# Patient Record
Sex: Male | Born: 1960 | Race: White | Hispanic: No | Marital: Single | Smoking: Never smoker
Health system: Southern US, Community
[De-identification: ages and names within clinical notes are randomized; demographics above are authoritative.]

## PROBLEM LIST (undated history)

## (undated) DIAGNOSIS — R7303 Prediabetes: Secondary | ICD-10-CM

## (undated) DIAGNOSIS — I10 Essential (primary) hypertension: Secondary | ICD-10-CM

## (undated) DIAGNOSIS — E119 Type 2 diabetes mellitus without complications: Secondary | ICD-10-CM

## (undated) DIAGNOSIS — G8929 Other chronic pain: Secondary | ICD-10-CM

## (undated) DIAGNOSIS — I2510 Atherosclerotic heart disease of native coronary artery without angina pectoris: Secondary | ICD-10-CM

## (undated) DIAGNOSIS — E559 Vitamin D deficiency, unspecified: Secondary | ICD-10-CM

## (undated) DIAGNOSIS — I82409 Acute embolism and thrombosis of unspecified deep veins of unspecified lower extremity: Secondary | ICD-10-CM

## (undated) DIAGNOSIS — F410 Panic disorder [episodic paroxysmal anxiety] without agoraphobia: Secondary | ICD-10-CM

## (undated) DIAGNOSIS — G4730 Sleep apnea, unspecified: Secondary | ICD-10-CM

## (undated) DIAGNOSIS — I219 Acute myocardial infarction, unspecified: Secondary | ICD-10-CM

## (undated) DIAGNOSIS — M25512 Pain in left shoulder: Secondary | ICD-10-CM

## (undated) DIAGNOSIS — M549 Dorsalgia, unspecified: Secondary | ICD-10-CM

## (undated) HISTORY — DX: Essential (primary) hypertension: I10

## (undated) HISTORY — DX: Morbid (severe) obesity due to excess calories: E66.01

## (undated) HISTORY — PX: TONSILLECTOMY: SUR1361

## (undated) HISTORY — DX: Other chronic pain: G89.29

## (undated) HISTORY — PX: OTHER SURGICAL HISTORY: SHX169

## (undated) HISTORY — DX: Pain in left shoulder: M25.512

## (undated) HISTORY — DX: Vitamin D deficiency, unspecified: E55.9

## (undated) HISTORY — DX: Type 2 diabetes mellitus without complications: E11.9

## (undated) HISTORY — DX: Prediabetes: R73.03

## (undated) HISTORY — DX: Dorsalgia, unspecified: M54.9

## (undated) HISTORY — DX: Acute myocardial infarction, unspecified: I21.9

## (undated) HISTORY — PX: LUMBAR SPINE SURGERY: SHX701

## (undated) HISTORY — DX: Sleep apnea, unspecified: G47.30

## (undated) HISTORY — DX: Panic disorder (episodic paroxysmal anxiety): F41.0

## (undated) HISTORY — DX: Atherosclerotic heart disease of native coronary artery without angina pectoris: I25.10

---

## 1998-03-11 ENCOUNTER — Ambulatory Visit (HOSPITAL_COMMUNITY): Admission: RE | Admit: 1998-03-11 | Discharge: 1998-03-11 | Payer: Self-pay | Admitting: Neurosurgery

## 1998-03-11 ENCOUNTER — Encounter: Payer: Self-pay | Admitting: Neurosurgery

## 1998-03-26 ENCOUNTER — Encounter: Payer: Self-pay | Admitting: Neurosurgery

## 1998-03-26 ENCOUNTER — Ambulatory Visit (HOSPITAL_COMMUNITY): Admission: RE | Admit: 1998-03-26 | Discharge: 1998-03-26 | Payer: Self-pay | Admitting: Neurosurgery

## 1998-04-20 ENCOUNTER — Encounter: Payer: Self-pay | Admitting: Neurosurgery

## 1998-04-20 ENCOUNTER — Ambulatory Visit (HOSPITAL_COMMUNITY): Admission: RE | Admit: 1998-04-20 | Discharge: 1998-04-20 | Payer: Self-pay | Admitting: Neurosurgery

## 1998-08-12 ENCOUNTER — Encounter: Payer: Self-pay | Admitting: Neurosurgery

## 1998-08-14 ENCOUNTER — Inpatient Hospital Stay (HOSPITAL_COMMUNITY): Admission: RE | Admit: 1998-08-14 | Discharge: 1998-08-20 | Payer: Self-pay | Admitting: Neurosurgery

## 1998-08-14 ENCOUNTER — Encounter: Payer: Self-pay | Admitting: Neurosurgery

## 1998-08-15 ENCOUNTER — Encounter: Payer: Self-pay | Admitting: Neurosurgery

## 1998-08-27 ENCOUNTER — Inpatient Hospital Stay (HOSPITAL_COMMUNITY): Admission: AD | Admit: 1998-08-27 | Discharge: 1998-09-04 | Payer: Self-pay | Admitting: Neurological Surgery

## 1998-08-28 ENCOUNTER — Encounter: Payer: Self-pay | Admitting: Neurological Surgery

## 1999-02-02 ENCOUNTER — Encounter: Payer: Self-pay | Admitting: Neurosurgery

## 1999-02-02 ENCOUNTER — Encounter: Admission: RE | Admit: 1999-02-02 | Discharge: 1999-02-02 | Payer: Self-pay | Admitting: Neurosurgery

## 1999-10-28 ENCOUNTER — Encounter: Admission: RE | Admit: 1999-10-28 | Discharge: 1999-10-28 | Payer: Self-pay | Admitting: Neurosurgery

## 1999-10-28 ENCOUNTER — Encounter: Payer: Self-pay | Admitting: Neurosurgery

## 2001-02-19 ENCOUNTER — Emergency Department (HOSPITAL_COMMUNITY): Admission: EM | Admit: 2001-02-19 | Discharge: 2001-02-19 | Payer: Self-pay | Admitting: *Deleted

## 2001-02-19 ENCOUNTER — Encounter: Payer: Self-pay | Admitting: *Deleted

## 2010-03-09 ENCOUNTER — Emergency Department (HOSPITAL_COMMUNITY)
Admission: EM | Admit: 2010-03-09 | Discharge: 2010-03-09 | Payer: Self-pay | Source: Home / Self Care | Admitting: Emergency Medicine

## 2010-06-08 LAB — DIFFERENTIAL
Basophils Absolute: 0 10*3/uL (ref 0.0–0.1)
Lymphocytes Relative: 9 % — ABNORMAL LOW (ref 12–46)
Lymphs Abs: 0.9 10*3/uL (ref 0.7–4.0)
Neutrophils Relative %: 83 % — ABNORMAL HIGH (ref 43–77)

## 2010-06-08 LAB — CBC
HCT: 38.6 % — ABNORMAL LOW (ref 39.0–52.0)
Hemoglobin: 13.3 g/dL (ref 13.0–17.0)
MCHC: 34.5 g/dL (ref 30.0–36.0)
MCV: 93.7 fL (ref 78.0–100.0)
RBC: 4.12 MIL/uL — ABNORMAL LOW (ref 4.22–5.81)

## 2010-06-08 LAB — POCT CARDIAC MARKERS
CKMB, poc: <1 ng/mL — ABNORMAL LOW (ref 1.0–8.0)
Troponin i, poc: <0.05 ng/mL (ref 0.00–0.09)

## 2010-06-08 LAB — BASIC METABOLIC PANEL
Calcium: 9 mg/dL (ref 8.4–10.5)
Creatinine, Ser: 1.17 mg/dL (ref 0.4–1.5)
GFR calc Af Amer: >60 mL/min (ref >60)

## 2010-06-08 LAB — BRAIN NATRIURETIC PEPTIDE: Pro B Natriuretic peptide (BNP): 51.9 pg/mL (ref 0.0–100.0)

## 2012-09-09 DIAGNOSIS — R079 Chest pain, unspecified: Secondary | ICD-10-CM

## 2012-09-17 ENCOUNTER — Encounter: Payer: Self-pay | Admitting: Cardiology

## 2013-01-10 ENCOUNTER — Ambulatory Visit (HOSPITAL_COMMUNITY)
Admission: RE | Admit: 2013-01-10 | Discharge: 2013-01-10 | Disposition: A | Discharge status: Home / Self Care | Payer: Medicaid Other | Source: Ambulatory Visit | Attending: Family Medicine | Admitting: Family Medicine

## 2013-01-10 ENCOUNTER — Other Ambulatory Visit (HOSPITAL_COMMUNITY): Payer: Self-pay | Admitting: Family Medicine

## 2013-01-10 DIAGNOSIS — M25561 Pain in right knee: Secondary | ICD-10-CM

## 2013-01-10 DIAGNOSIS — IMO0002 Reserved for concepts with insufficient information to code with codable children: Secondary | ICD-10-CM | POA: Insufficient documentation

## 2013-01-10 DIAGNOSIS — M25569 Pain in unspecified knee: Secondary | ICD-10-CM | POA: Diagnosis present

## 2013-01-10 DIAGNOSIS — M1710 Unilateral primary osteoarthritis, unspecified knee: Secondary | ICD-10-CM | POA: Insufficient documentation

## 2013-11-12 ENCOUNTER — Telehealth: Payer: Self-pay | Admitting: Family Medicine

## 2013-11-12 NOTE — Telephone Encounter (Signed)
Pt will call and have PCP changed Will call back for appt

## 2013-11-18 ENCOUNTER — Telehealth: Payer: Self-pay | Admitting: Family Medicine

## 2013-11-18 NOTE — Telephone Encounter (Signed)
appt scheduled for Wed with Dr. Sabra Heck patient aware that [facility] is not taking new patients at this time

## 2013-11-20 ENCOUNTER — Encounter: Payer: Self-pay | Admitting: Family Medicine

## 2013-11-20 ENCOUNTER — Ambulatory Visit (INDEPENDENT_AMBULATORY_CARE_PROVIDER_SITE_OTHER): Payer: Medicaid Other | Admitting: Family Medicine

## 2013-11-20 ENCOUNTER — Encounter (INDEPENDENT_AMBULATORY_CARE_PROVIDER_SITE_OTHER): Payer: Self-pay

## 2013-11-20 VITALS — BP 156/98 | HR 87 | Temp 99.0°F | Ht 69.0 in | Wt 398.0 lb

## 2013-11-20 DIAGNOSIS — M545 Low back pain, unspecified: Secondary | ICD-10-CM

## 2013-11-20 DIAGNOSIS — R635 Abnormal weight gain: Secondary | ICD-10-CM

## 2013-11-20 DIAGNOSIS — I10 Essential (primary) hypertension: Secondary | ICD-10-CM

## 2013-11-20 DIAGNOSIS — Z1211 Encounter for screening for malignant neoplasm of colon: Secondary | ICD-10-CM

## 2013-11-20 DIAGNOSIS — Z0000 Encounter for general adult medical examination without abnormal findings: Secondary | ICD-10-CM

## 2013-11-20 LAB — POCT CBC
Granulocyte percent: 84.9 %G — AB (ref 37–80)
HCT, POC: 43.8 % (ref 43.5–53.7)
HEMOGLOBIN: 14.3 g/dL (ref 14.1–18.1)
Lymph, poc: 1 (ref 0.6–3.4)
MCH: 31.1 pg (ref 27–31.2)
MCHC: 32.6 g/dL (ref 31.8–35.4)
MCV: 95.5 fL (ref 80–97)
MPV: 8.8 fL (ref 0–99.8)
POC GRANULOCYTE: 7.7 — AB (ref 2–6.9)
POC LYMPH PERCENT: 11.4 %L (ref 10–50)
Platelet Count, POC: 248 10*3/uL (ref 142–424)
RBC: 4.6 M/uL — AB (ref 4.69–6.13)
RDW, POC: 13.2 %
WBC: 9.1 10*3/uL (ref 4.6–10.2)

## 2013-11-20 MED ORDER — HYDROCODONE-ACETAMINOPHEN 10-325 MG PO TABS: 1.0000 | ORAL_TABLET | Freq: Three times a day (TID) | ORAL | Status: DC | PRN

## 2013-11-20 MED ORDER — BISOPROLOL-HYDROCHLOROTHIAZIDE 10-6.25 MG PO TABS: 1.0000 | ORAL_TABLET | Freq: Every day | ORAL | Status: DC

## 2013-11-20 MED ORDER — TERBINAFINE HCL 250 MG PO TABS: 250.0000 mg | ORAL_TABLET | Freq: Every day | ORAL | Status: DC

## 2013-11-20 NOTE — Progress Notes (Signed)
   Subjective:    Patient ID: Bradley Torres, male    DOB: Jul 29, 1960, 53 y.o.   MRN: [id number]  HPI  53 year old gentleman from [place] who has been out of medical care for several months. He recently acquired Medicaid but still has not been able to find a doctor that will take him. 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 He was previously treated for high blood pressure with metoprolol and hydrochlorothiazide but has been off of all meds for 6 months. He was treated with a beta blocker apparently for some palpitations. He has been hospitalized previously with chest pain and underwent stress testing which showed no    Review of Systems  Constitutional: Positive for fatigue.  HENT: Negative.   Cardiovascular: Negative.   Gastrointestinal: Negative.   Genitourinary: Negative.   Neurological: Negative.   Psychiatric/Behavioral: Negative.        Objective:   Physical Exam  Constitutional: He is oriented to person, place, and time.  Morbid obesity  HENT:  Head: Normocephalic.  Eyes: Pupils are equal, round, and reactive to light.  Neck: Normal range of motion.  Cardiovascular: Normal rate, regular rhythm and normal heart sounds.   Pulmonary/Chest: Effort normal and breath sounds normal.  Abdominal: Soft.  Genitourinary:  Erythema of scrotum and inner thigh Does not appear to be cellulitis  Musculoskeletal: He exhibits edema.  Neurological: He is alert and oriented to person, place, and time.  Psychiatric: He has a normal mood and affect. His behavior is normal. Judgment and thought content normal.   BP 156/98  Pulse 87  Temp(Src) 99 F (37.2 C) (Oral)  Ht $R'5\' 9"'Bk$  (1.753 m)  Wt 398 lb (180.532 kg)  BMI 58.75 kg/m2       Assessment & Plan:  1. Colon cancer screening  - Ambulatory referral to Gastroenterology  2. Routine  general medical examination at a health care facility Pt has been past around and not received medical care due to analgesic needs.  I explained I will treat and trust him as long as he is honest and uses meds only as prescribed. - POCT CBC - CMP14+EGFR - Lipid panel  3. Morbid obesity Discussed briefly.  Not likely that he will seriously try to lose weight  4. 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 Essential hypertension, benign Begin Ziac for beta blockade effect and diuretic.Marland Kitchen  Wardell Honour MD

## 2013-11-21 LAB — CMP14+EGFR
A/G RATIO: 1.4 (ref 1.1–2.5)
ALBUMIN: 4.2 g/dL (ref 3.5–5.5)
ALK PHOS: 76 IU/L (ref 39–117)
ALT: 27 IU/L (ref 0–44)
AST: 24 IU/L (ref 0–40)
BUN/Creatinine Ratio: 13 (ref 9–20)
BUN: 15 mg/dL (ref 6–24)
CALCIUM: 9.3 mg/dL (ref 8.7–10.2)
CO2: 25 mmol/L (ref 18–29)
CREATININE: 1.2 mg/dL (ref 0.76–1.27)
Chloride: 100 mmol/L (ref 97–108)
GFR calc Af Amer: 80 mL/min/{1.73_m2} (ref >59)
GFR, EST NON AFRICAN AMERICAN: 70 mL/min/{1.73_m2} (ref >59)
Globulin, Total: 3 g/dL (ref 1.5–4.5)
Glucose: 115 mg/dL — ABNORMAL HIGH (ref 65–99)
POTASSIUM: 4.8 mmol/L (ref 3.5–5.2)
Sodium: 141 mmol/L (ref 134–144)
TOTAL PROTEIN: 7.2 g/dL (ref 6.0–8.5)
Total Bilirubin: 0.4 mg/dL (ref 0.0–1.2)

## 2013-11-21 LAB — LIPID PANEL
CHOLESTEROL TOTAL: 177 mg/dL (ref 100–199)
Chol/HDL Ratio: 3.3 ratio units (ref 0.0–5.0)
HDL: 53 mg/dL (ref >39)
LDL Calculated: 111 mg/dL — ABNORMAL HIGH (ref 0–99)
Triglycerides: 63 mg/dL (ref 0–149)
VLDL CHOLESTEROL CAL: 13 mg/dL (ref 5–40)

## 2013-11-25 ENCOUNTER — Telehealth: Payer: Self-pay | Admitting: Family Medicine

## 2013-11-25 NOTE — Telephone Encounter (Signed)
Message copied by Waverly Ferrari on Mon Nov 25, 2013  8:48 AM ------      Message from: Wardell Honour      Created: Thu Nov 21, 2013  8:03 PM       Pt has pre-diabetes; needs to lose weight , watch carb intake, and exercise.  Lipids are okay ------

## 2013-12-19 ENCOUNTER — Telehealth: Payer: Self-pay | Admitting: Family Medicine

## 2013-12-19 NOTE — Telephone Encounter (Signed)
error 

## 2013-12-23 ENCOUNTER — Encounter: Payer: Self-pay | Admitting: Family Medicine

## 2014-01-23 ENCOUNTER — Encounter: Payer: Self-pay | Admitting: Family Medicine

## 2014-01-23 ENCOUNTER — Ambulatory Visit (INDEPENDENT_AMBULATORY_CARE_PROVIDER_SITE_OTHER): Payer: Medicaid Other | Admitting: Family Medicine

## 2014-01-23 VITALS — BP 158/94 | HR 67 | Temp 98.5°F | Ht 69.0 in | Wt >= 6400 oz

## 2014-01-23 DIAGNOSIS — R635 Abnormal weight gain: Secondary | ICD-10-CM

## 2014-01-23 DIAGNOSIS — M549 Dorsalgia, unspecified: Secondary | ICD-10-CM | POA: Insufficient documentation

## 2014-01-23 DIAGNOSIS — I152 Hypertension secondary to endocrine disorders: Secondary | ICD-10-CM | POA: Insufficient documentation

## 2014-01-23 DIAGNOSIS — G8929 Other chronic pain: Secondary | ICD-10-CM | POA: Insufficient documentation

## 2014-01-23 DIAGNOSIS — I10 Essential (primary) hypertension: Secondary | ICD-10-CM

## 2014-01-23 DIAGNOSIS — E1159 Type 2 diabetes mellitus with other circulatory complications: Secondary | ICD-10-CM | POA: Insufficient documentation

## 2014-01-23 DIAGNOSIS — M545 Low back pain: Secondary | ICD-10-CM

## 2014-01-23 MED ORDER — HYDROCODONE-ACETAMINOPHEN 10-325 MG PO TABS: 1.0000 | ORAL_TABLET | Freq: Three times a day (TID) | ORAL | Status: DC | PRN

## 2014-01-23 MED ORDER — LOSARTAN POTASSIUM 50 MG PO TABS: 50.0000 mg | ORAL_TABLET | Freq: Every day | ORAL | Status: DC

## 2014-01-23 NOTE — Progress Notes (Signed)
   Subjective:    Patient ID: Bradley Torres, male    DOB: [birth year], 53 y.o.   MRN: [id number]  HPI 53 year old gentleman here for follow-up. He was seen 1 month ago was started on beta blocker with diuretic. Pressure has responded minimally. As a result of some screening lab work he was found to have prediabetic glucose levels and advise to watch his carbohydrate intake and try to lose some weight. He says that he is more active since I gave him an analgesic for his back pain but his weight has gone up. I encouraged him to continue with activity and dieting. We talked about possibility of gastric bypass surgery which I think would make sense,  given his size and propensity to develop diabetes, but he is not interested. Also thought about using G LP agent but he does not want to think about needles    Review of Systems  Constitutional: Negative.   HENT: Negative.   Eyes: Negative.   Respiratory: Negative.  Negative for shortness of breath.   Cardiovascular: Negative.  Negative for chest pain and leg swelling.  Gastrointestinal: Negative.   Genitourinary: Negative.   Musculoskeletal: Negative.   Skin: Negative.   Neurological: Negative.   Psychiatric/Behavioral: Negative.   All other systems reviewed and are negative.      Objective:   Physical Exam  Constitutional: He is oriented to person, place, and time. He appears well-developed and well-nourished.  HENT:  Head: Normocephalic.  Right Ear: External ear normal.  Left Ear: External ear normal.  Nose: Nose normal.  Mouth/Throat: Oropharynx is clear and moist.  Eyes: Conjunctivae and EOM are normal. Pupils are equal, round, and reactive to light.  Neck: Normal range of motion. Neck supple.  Cardiovascular: Normal rate, regular rhythm, normal heart sounds and intact distal pulses.   Pulmonary/Chest: Effort normal and breath sounds normal.  Abdominal: Soft. Bowel sounds are normal.  Musculoskeletal: Normal range of motion.    Neurological: He is alert and oriented to person, place, and time.  Skin: Skin is warm and dry.  Psychiatric: He has a normal mood and affect. His behavior is normal. Judgment and thought content normal.     BP 158/94  Pulse 67  Temp(Src) 98.5 F (36.9 C) (Oral)  Ht 5\' 9"  (1.753 m)  Wt 403 lb 12.8 oz (183.162 kg)  BMI 59.60 kg/m2     Assessment & Plan:  1. Essential hypertension Not at goal.  Add Losartan, 50 mg   2. Low back pain, unspecified back pain laterality, with sciatica presence unspecified Refill Norco #  3. Severe obesity (BMI >= 40) Encourage better dieting and movement  Wardell Honour MD

## 2014-01-23 NOTE — Patient Instructions (Signed)
Diabetes and Exercise Exercising regularly is important. It is not just about losing weight. It has many health benefits, such as:  Improving your overall fitness, flexibility, and endurance.  Increasing your bone density.  Helping with weight control.  Decreasing your body fat.  Increasing your muscle strength.  Reducing stress and tension.  Improving your overall health. People with diabetes who exercise gain additional benefits because exercise:  Reduces appetite.  Improves the body's use of blood sugar (glucose).  Helps lower or control blood glucose.  Decreases blood pressure.  Helps control blood lipids (such as cholesterol and triglycerides).  Improves the body's use of the hormone insulin by:  Increasing the body's insulin sensitivity.  Reducing the body's insulin needs.  Decreases the risk for heart disease because exercising:  Lowers cholesterol and triglycerides levels.  Increases the levels of good cholesterol (such as high-density lipoproteins [HDL]) in the body.  Lowers blood glucose levels. YOUR ACTIVITY PLAN  Choose an activity that you enjoy and set realistic goals. Your health care provider or diabetes educator can help you make an activity plan that works for you. Exercise regularly as directed by your health care provider. This includes:  Performing resistance training twice a week such as push-ups, sit-ups, lifting weights, or using resistance bands.  Performing 150 minutes of cardio exercises each week such as walking, running, or playing sports.  Staying active and spending no more than 90 minutes at one time being inactive. Even short bursts of exercise are good for you. Three 10-minute sessions spread throughout the day are just as beneficial as a single 30-minute session. Some exercise ideas include:  Taking the dog for a walk.  Taking the stairs instead of the elevator.  Dancing to your favorite song.  Doing an exercise  video.  Doing your favorite exercise with a friend. RECOMMENDATIONS FOR EXERCISING WITH TYPE 1 OR TYPE 2 DIABETES   Check your blood glucose before exercising. If blood glucose levels are greater than 240 mg/dL, check for urine ketones. Do not exercise if ketones are present.  Avoid injecting insulin into areas of the body that are going to be exercised. For example, avoid injecting insulin into:  The arms when playing tennis.  The legs when jogging.  Keep a record of:  Food intake before and after you exercise.  Expected peak times of insulin action.  Blood glucose levels before and after you exercise.  The type and amount of exercise you have done.  Review your records with your health care provider. Your health care provider will help you to develop guidelines for adjusting food intake and insulin amounts before and after exercising.  If you take insulin or oral hypoglycemic agents, watch for signs and symptoms of hypoglycemia. They include:  Dizziness.  Shaking.  Sweating.  Chills.  Confusion.  Drink plenty of water while you exercise to prevent dehydration or heat stroke. Body water is lost during exercise and must be replaced.  Talk to your health care provider before starting an exercise program to make sure it is safe for you. Remember, almost any type of activity is better than none. Document Released: 06/04/2003 Document Revised: 07/29/2013 Document Reviewed: 08/21/2012 Exit[employer] Patient Information 2015 [employer]. This information is not intended to replace advice given to you by your health care provider. Make sure you discuss any questions you have with your health care provider.  

## 2014-02-07 ENCOUNTER — Telehealth: Payer: Self-pay | Admitting: Family Medicine

## 2014-02-11 NOTE — Telephone Encounter (Signed)
Multiple attempts have been made to contact pt regarding scheduling appt, will close encounter

## 2014-02-17 ENCOUNTER — Encounter: Payer: Self-pay | Admitting: Family Medicine

## 2014-02-17 ENCOUNTER — Telehealth: Payer: Self-pay | Admitting: Family Medicine

## 2014-02-17 ENCOUNTER — Ambulatory Visit (INDEPENDENT_AMBULATORY_CARE_PROVIDER_SITE_OTHER): Payer: Medicaid Other | Admitting: Family Medicine

## 2014-02-17 VITALS — BP 162/88 | HR 80 | Temp 97.5°F | Ht 69.0 in | Wt 399.4 lb

## 2014-02-17 DIAGNOSIS — R739 Hyperglycemia, unspecified: Secondary | ICD-10-CM

## 2014-02-17 DIAGNOSIS — R0600 Dyspnea, unspecified: Secondary | ICD-10-CM

## 2014-02-17 MED ORDER — BISOPROLOL-HYDROCHLOROTHIAZIDE 10-6.25 MG PO TABS: 1.0000 | ORAL_TABLET | Freq: Every day | ORAL | Status: DC

## 2014-02-17 MED ORDER — CITALOPRAM HYDROBROMIDE 20 MG PO TABS: 20.0000 mg | ORAL_TABLET | Freq: Every day | ORAL | Status: DC

## 2014-02-17 NOTE — Progress Notes (Signed)
   Subjective:    Patient ID: Bradley Torres, male    DOB: 01-26-1961, 53 y.o.   MRN: [id number]  HPI 53 year old male here for follow-up visit. At his last visit losartan was added to Ziac, 10 mg. He did not understand that he is to take both prescriptions. Consequently his pressure today is elevated at 162/88. In addition he was found to be prediabetic on his last lab work in the losartan will confer some protective advantage to his kidneys.  Today he also complains of some subjective shortness of breath. There is no cough. There is no history of asthma or chronic lung disease. This dyspneic sensation seems to be related to anxiety.    Review of Systems  Constitutional: Negative.   HENT: Negative.   Eyes: Negative.   Respiratory: Positive for shortness of breath.   Cardiovascular: Negative.  Negative for chest pain and leg swelling.  Gastrointestinal: Negative.   Genitourinary: Negative.   Musculoskeletal: Negative.   Skin: Negative.   Neurological: Negative.   Psychiatric/Behavioral: Negative.   All other systems reviewed and are negative.      Objective:   Physical Exam  Constitutional: He is oriented to person, place, and time. He appears well-developed and well-nourished.  HENT:  Head: Normocephalic.  Right Ear: External ear normal.  Left Ear: External ear normal.  Nose: Nose normal.  Mouth/Throat: Oropharynx is clear and moist.  Eyes: Conjunctivae and EOM are normal. Pupils are equal, round, and reactive to light.  Neck: Normal range of motion. Neck supple.  Cardiovascular: Normal rate, regular rhythm, normal heart sounds and intact distal pulses.   Pulmonary/Chest: Effort normal and breath sounds normal.  Abdominal: Soft. Bowel sounds are normal.  Musculoskeletal: Normal range of motion.  Neurological: He is alert and oriented to person, place, and time.  Skin: Skin is warm and dry.  Psychiatric: He has a normal mood and affect. His behavior is normal. Judgment and  thought content normal.   BP 162/88 mmHg  Pulse 80  Temp(Src) 97.5 F (36.4 C) (Oral)  Ht 5\' 9"  (1.753 m)  Wt 399 lb 6.4 oz (181.167 kg)  BMI 58.95 kg/m2       Assessment & Plan:  1. Hyperglycemia We'll check A1c today. Given his obesity diabetes is potential risk  2. Dyspnea Suspect panic d.Pulse ox normal at 95% Will try celexa x 1 month  Wardell Honour MD

## 2014-02-17 NOTE — Telephone Encounter (Signed)
Appointment 03/27/2014 at 9:30 with Sabra Heck detailed message left.

## 2014-02-19 ENCOUNTER — Ambulatory Visit: Payer: Medicaid Other | Admitting: Family Medicine

## 2014-03-27 ENCOUNTER — Ambulatory Visit (INDEPENDENT_AMBULATORY_CARE_PROVIDER_SITE_OTHER): Payer: Medicaid Other | Admitting: Family Medicine

## 2014-03-27 ENCOUNTER — Ambulatory Visit (INDEPENDENT_AMBULATORY_CARE_PROVIDER_SITE_OTHER): Payer: Medicaid Other

## 2014-03-27 ENCOUNTER — Encounter: Payer: Self-pay | Admitting: Family Medicine

## 2014-03-27 VITALS — BP 147/88 | HR 69 | Temp 97.5°F | Ht 69.0 in | Wt 394.4 lb

## 2014-03-27 DIAGNOSIS — M25512 Pain in left shoulder: Secondary | ICD-10-CM

## 2014-03-27 MED ORDER — HYDROCODONE-ACETAMINOPHEN 10-325 MG PO TABS: 1.0000 | ORAL_TABLET | Freq: Three times a day (TID) | ORAL | Status: DC | PRN

## 2014-03-27 NOTE — Progress Notes (Signed)
   Subjective:    Patient ID: Bradley Torres, male    DOB: 1961/02/22, 53 y.o.   MRN: [id number]  HPI    Review of Systems     Objective:   Physical Exam        Assessment & Plan:  1. Pain in joint, shoulder region, left Plain film is unrevealing. I still suspect rotator cuff tear. Would like to try several weeks of physical therapy and if no progress is made send for MRI. Did refill hydrocodone that he has taken for back pain - DG Shoulder Left  Wardell Honour MD - Ambulatory referral to Physical Therapy

## 2014-03-27 NOTE — Progress Notes (Signed)
   Subjective:    Patient ID: JAMONT MELLIN, male    DOB: 02-18-1961, 53 y.o.   MRN: [id number]  Shoulder Pain    patient is here to follow-up his breathing and panic disorder. He did not take Celexa but 3 days because it made him "feel funny." He really has not had any more of the panic attacks by history but complains today more of his left shoulder. He had a fall about 3 weeks ago injuring the shoulder. Since then he really has not been able to use his left arm due to pain.    Review of Systems  Musculoskeletal: Negative.        Left shoulder pain seems to be more lateral aspect       Objective:   Physical Exam  Musculoskeletal:  Exam limited to the left shoulder: Really decreased range of motion. I could not get him to go through much of any range of motion. Internal rotation seem to be okay. Again tenderness seems to be more lateral but is also generalized   BP 147/88 mmHg  Pulse 69  Temp(Src) 97.5 F (36.4 C) (Oral)  Ht 5\' 9"  (1.753 m)  Wt 394 lb 6.4 oz (178.899 kg)  BMI 58.22 kg/m2       Assessment & Plan:

## 2014-04-08 ENCOUNTER — Ambulatory Visit: Payer: Medicaid Other | Attending: Family Medicine | Admitting: Physical Therapy

## 2014-04-08 DIAGNOSIS — M25512 Pain in left shoulder: Secondary | ICD-10-CM | POA: Insufficient documentation

## 2014-05-06 ENCOUNTER — Telehealth: Payer: Self-pay | Admitting: Family Medicine

## 2014-05-06 ENCOUNTER — Ambulatory Visit: Payer: Medicaid Other | Admitting: Family Medicine

## 2014-05-13 ENCOUNTER — Telehealth: Payer: Self-pay | Admitting: Family Medicine

## 2014-05-13 NOTE — Telephone Encounter (Signed)
Appointment scheduled for 2/25 with Sabra Heck.

## 2014-05-15 NOTE — Telephone Encounter (Signed)
Several attempts have been made to contact patient. This encounter will be closed.  

## 2014-05-22 ENCOUNTER — Encounter: Payer: Self-pay | Admitting: Family Medicine

## 2014-05-22 ENCOUNTER — Ambulatory Visit (INDEPENDENT_AMBULATORY_CARE_PROVIDER_SITE_OTHER): Payer: Medicaid Other | Admitting: Family Medicine

## 2014-05-22 VITALS — BP 158/98 | HR 80 | Temp 98.0°F | Ht 69.0 in | Wt >= 6400 oz

## 2014-05-22 DIAGNOSIS — R079 Chest pain, unspecified: Secondary | ICD-10-CM

## 2014-05-22 DIAGNOSIS — M25512 Pain in left shoulder: Secondary | ICD-10-CM

## 2014-05-22 MED ORDER — HYDROCODONE-ACETAMINOPHEN 10-325 MG PO TABS: 1.0000 | ORAL_TABLET | Freq: Four times a day (QID) | ORAL | Status: DC | PRN

## 2014-05-22 NOTE — Progress Notes (Signed)
   Subjective:    Patient ID: Bradley Torres, male    DOB: [birth year], 54 y.o.   MRN: [id number]  HPI 54 year old man with left shoulder pain that occurred after a fall. He was seen here on 1231 x-rays and send to physical therapy. By his history physical therapist told him that they could not help him and he now returns for further care. Basically does not move the arm or shoulder except from the elbow down. He has been given hydrocodone for pain.  As we were finishing up, or so I thought, he tells me about some chest pain. He apparently had a stress test at [facility] several years ago the results of which are not very conclusive and he is or my mind. Since he has this pain and were not sure if he could have ischemic disease I would like to get him seen by cardiology consultation. Pain is atypical and occurs at rest or with exertion but is somewhat relieved by change of position. I explained that this is not typical of angina but on the other hand I cannot be certain  Patient Active Problem List   Diagnosis Date Noted  . Essential hypertension 01/23/2014  . Back pain 01/23/2014  . Severe obesity (BMI >= 40) 01/23/2014   Outpatient Encounter Prescriptions as of 05/22/2014  Medication Sig  . bisoprolol-hydrochlorothiazide (ZIAC) 10-6.25 MG per tablet Take 1 tablet by mouth daily.  Marland Kitchen losartan (COZAAR) 50 MG tablet Take 1 tablet (50 mg total) by mouth daily.  Marland Kitchen terbinafine (LAMISIL) 250 MG tablet Take 1 tablet (250 mg total) by mouth daily.  Marland Kitchen HYDROcodone-acetaminophen (NORCO) 10-325 MG per tablet Take 1 tablet by mouth every 8 (eight) hours as needed. (Patient not taking: Reported on 05/22/2014)     Review of Systems  Respiratory: Positive for chest tightness.   Musculoskeletal: Positive for arthralgias.       L shoulder       Objective:   Physical Exam  Cardiovascular: Normal rate and regular rhythm.   Pulmonary/Chest: Effort normal and breath sounds normal.  Musculoskeletal:  Very  limited range of motion and hard to localize pain     BP 158/98 mmHg  Pulse 80  Temp(Src) 98 F (36.7 C) (Oral)  Ht 5\' 9"  (1.753 m)  Wt 404 lb (183.253 kg)  BMI 59.63 kg/m2      Assessment & Plan:  1. Chest pain, unspecified chest pain type  - Ambulatory referral to Cardiology  2. Left shoulder pain  - MR Shoulder Left Wo Contrast; Future  3. Hypertension Increase losartan to 100 mg  Wardell Honour MD

## 2014-06-03 ENCOUNTER — Other Ambulatory Visit: Payer: Self-pay | Admitting: *Deleted

## 2014-06-03 MED ORDER — BISOPROLOL-HYDROCHLOROTHIAZIDE 10-6.25 MG PO TABS: 1.0000 | ORAL_TABLET | Freq: Every day | ORAL | Status: DC

## 2014-06-05 ENCOUNTER — Encounter: Payer: Self-pay | Admitting: Cardiology

## 2014-06-09 ENCOUNTER — Encounter: Payer: Self-pay | Admitting: Cardiology

## 2014-06-09 ENCOUNTER — Encounter: Payer: PRIVATE HEALTH INSURANCE | Admitting: Cardiology

## 2014-06-09 ENCOUNTER — Telehealth: Payer: Self-pay | Admitting: Family Medicine

## 2014-06-09 NOTE — Progress Notes (Signed)
No show  This encounter was created in error - please disregard.

## 2014-06-16 ENCOUNTER — Other Ambulatory Visit: Payer: Self-pay

## 2014-06-16 DIAGNOSIS — M25512 Pain in left shoulder: Secondary | ICD-10-CM

## 2014-06-19 ENCOUNTER — Ambulatory Visit: Payer: Medicaid Other | Admitting: Family Medicine

## 2014-06-25 ENCOUNTER — Telehealth: Payer: Self-pay | Admitting: Cardiovascular Disease

## 2014-06-25 ENCOUNTER — Encounter: Payer: Self-pay | Admitting: *Deleted

## 2014-06-25 ENCOUNTER — Encounter: Payer: Self-pay | Admitting: Cardiovascular Disease

## 2014-06-25 ENCOUNTER — Ambulatory Visit (INDEPENDENT_AMBULATORY_CARE_PROVIDER_SITE_OTHER): Payer: Medicaid Other | Admitting: Cardiovascular Disease

## 2014-06-25 VITALS — BP 143/83 | HR 81 | Ht 71.0 in | Wt >= 6400 oz

## 2014-06-25 DIAGNOSIS — R079 Chest pain, unspecified: Secondary | ICD-10-CM

## 2014-06-25 DIAGNOSIS — R0609 Other forms of dyspnea: Secondary | ICD-10-CM

## 2014-06-25 DIAGNOSIS — I10 Essential (primary) hypertension: Secondary | ICD-10-CM

## 2014-06-25 DIAGNOSIS — Z8249 Family history of ischemic heart disease and other diseases of the circulatory system: Secondary | ICD-10-CM | POA: Diagnosis not present

## 2014-06-25 MED ORDER — BISOPROLOL-HYDROCHLOROTHIAZIDE 10-6.25 MG PO TABS: ORAL_TABLET | ORAL | Status: DC

## 2014-06-25 MED ORDER — BISOPROLOL-HYDROCHLOROTHIAZIDE 10-6.25 MG PO TABS: 1.0000 | ORAL_TABLET | Freq: Every day | ORAL | Status: DC

## 2014-06-25 NOTE — Progress Notes (Signed)
Patient ID: Bradley Torres, male   DOB: 1961/03/22, 54 y.o.   MRN: [id number]       CARDIOLOGY CONSULT NOTE  Patient ID: Bradley Torres MRN: [id number] DOB/AGE: [birth year] 54 y.o.  Admit date: (Not on file) Primary Physician Wardell Honour, MD  Reason for Consultation: chest pain  HPI: The patient is a 54 year old male with a history of morbid obesity, hypertension, and a left shoulder injury leading to left arm pain who has been referred for the evaluation of chest pain. He reportedly has a strong family history of coronary artery disease.  He was hospitalized for chest pain in June 2014 at [facility]. He ultimately underwent a 2-day nuclear stress test at that time which demonstrated equivocal left ventricular perfusion. Variable soft tissue attenuation artifact was noted. There was a partially reversible defect in the mid anterolateral and anteroseptal walls and a mild degree of ischemia could not entirely be excluded. While left ventricular systolic function appeared to be normal on visual inspection, calculated LVEF was 47%.  He describes a left precordial chest pain that can occur with and without exertion, and is occasionally associated with shortness of breath. He has radiation of this pain down his left arm but also describes a left shoulder injury which he is soon to have imaged via MRI in [place]. The chest pain, however, predates his left shoulder injury. He occasionally has lightheadedness but denies syncope. He denies orthopnea but there is a questionable history of paroxysmal nocturnal dyspnea.  ECG performed in the office today demonstrates normal sinus rhythm with an isolated PVC, and is without ischemic ST segment or T-wave abnormalities.  Fam: Father had MI at 62.  Allergies  Allergen Reactions  . Ibuprofen     Feels that it caused rectal bleeding    Current Outpatient Prescriptions  Medication Sig Dispense Refill  . bisoprolol-hydrochlorothiazide  (ZIAC) 10-6.25 MG per tablet Take 1 tablet by mouth daily. 30 tablet 4  . HYDROcodone-acetaminophen (NORCO) 10-325 MG per tablet Take 1 tablet by mouth every 6 (six) hours as needed. 90 tablet 0  . losartan (COZAAR) 50 MG tablet Take 1 tablet (50 mg total) by mouth daily. 30 tablet 3  . terbinafine (LAMISIL) 250 MG tablet Take 1 tablet (250 mg total) by mouth daily. 71 tablet 1   No current facility-administered medications for this visit.    Past Medical History  Diagnosis Date  . Essential hypertension   . Panic disorder   . Prediabetes   . Morbid obesity   . Shoulder pain, left     Following fall  . Chronic back pain     Past Surgical History  Procedure Laterality Date  . Lumbar spine surgery    . Ankle fracture sugery Left     History   Social History  . Marital Status: Single    Spouse Name: N/A  . Number of Children: N/A  . Years of Education: N/A   Occupational History  . Not on file.   Social History Main Topics  . Smoking status: Never Smoker   . Smokeless tobacco: Never Used  . Alcohol Use: No     Comment: occ  . Drug Use: No  . Sexual Activity: Not on file   Other Topics Concern  . Not on file   Social History Narrative       Prior to Admission medications   Medication Sig Start Date End Date Taking? Authorizing Provider  bisoprolol-hydrochlorothiazide (ZIAC) 10-6.25 MG per tablet Take  1 tablet by mouth daily. 06/03/14   Wardell Honour, MD  HYDROcodone-acetaminophen (NORCO) 10-325 MG per tablet Take 1 tablet by mouth every 6 (six) hours as needed. 05/22/14   Wardell Honour, MD  losartan (COZAAR) 50 MG tablet Take 1 tablet (50 mg total) by mouth daily. 01/23/14   Wardell Honour, MD  terbinafine (LAMISIL) 250 MG tablet Take 1 tablet (250 mg total) by mouth daily. 11/20/13   Wardell Honour, MD     Review of systems complete and found to be negative unless listed above in HPI     Physical exam  Filed Vitals:   06/25/14 1332 06/25/14  1339  BP: 154/110 126/76  Pulse: 90 83  Height: 5\' 11"  (1.803 m)   Weight: 400 lb (181.439 kg)      General: NAD, morbidly obese Neck: Difficult to assess JVP. Lungs: Faint inspiratory and expiratory wheezes. CV: Regular rate and rhythm, normal S1/S2, no S3/S4, no murmur.  No peripheral edema.  No carotid bruit.    Abdomen: Morbidly obese. Skin: Intact without lesions or rashes.  Neurologic: Alert and oriented x 3.  Psych: Normal affect. Extremities: No clubbing or cyanosis.  HEENT: Normal.   ECG: Most recent ECG reviewed.  Labs:   Lab Results  Component Value Date   WBC 9.1 11/20/2013   HGB 14.3 11/20/2013   HCT 43.8 11/20/2013   MCV 95.5 11/20/2013   PLT 256 03/09/2010   No results for input(s): NA, K, CL, CO2, BUN, CREATININE, CALCIUM, PROT, BILITOT, ALKPHOS, ALT, AST, GLUCOSE in the last 168 hours.  Invalid input(s): LABALBU No results found for: CKTOTAL, CKMB, CKMBINDEX, TROPONINI  Lab Results  Component Value Date   CHOL 177 11/20/2013   Lab Results  Component Value Date   HDL 53 11/20/2013   Lab Results  Component Value Date   LDLCALC 111* 11/20/2013   Lab Results  Component Value Date   TRIG 63 11/20/2013   Lab Results  Component Value Date   CHOLHDL 3.3 11/20/2013   No results found for: LDLDIRECT       Studies: No results found.  ASSESSMENT AND PLAN:  1. Chest pain: ECG is normal. Given his risk factors which include obesity, HTN, and family history, I will obtain a 2-day Lexiscan Cardiolite stress test and echocardiogram with contrast for further clarification. 2. Essential hypertension: Markedly elevated but he has not taken his BP meds for the past 2 days. For the time being, continue bisoprolol-HCTZ to 10-6.25 mg daily and losartan 50 mg daily.  Dispo: f/u in 1 month.  Signed: Kate Sable, M.D., F.A.C.C.  06/25/2014, 1:37 PM

## 2014-06-25 NOTE — Addendum Note (Signed)
Addended by: Julian Hy T on: 06/25/2014 02:06 PM   Modules accepted: Orders

## 2014-06-25 NOTE — Patient Instructions (Addendum)
Your physician recommends that you schedule a follow-up appointment in: [place]  Your physician has recommended you make the following change in your medication:   INCREASE ZIAC 2 TABLET 2 TIMES DAILY 20MG /12.5MG    CONTINUE ALL OTHER MEDICATIONS AS DIRECTED  Your physician has requested that you have an echocardiogram. Echocardiography is a painless test that uses sound waves to create images of your heart. It provides your doctor with information about the size and shape of your heart and how well your heart's chambers and valves are working. This procedure takes approximately one hour. There are no restrictions for this procedure.  Your physician has requested that you have a lexiscan myoview. For further information please visit [email or web address]. Please follow instruction sheet, as given.  Thank you for choosing [facility]!!

## 2014-06-25 NOTE — Telephone Encounter (Signed)
Lesixcan & echo with contrast scheduled for 06/27/14 at [facility].

## 2014-06-26 NOTE — Telephone Encounter (Signed)
No precert required.  See appt comments

## 2014-06-27 ENCOUNTER — Other Ambulatory Visit (HOSPITAL_COMMUNITY): Payer: Disability Insurance

## 2014-06-30 ENCOUNTER — Encounter (HOSPITAL_COMMUNITY): Payer: Medicaid Other

## 2014-06-30 ENCOUNTER — Encounter (HOSPITAL_COMMUNITY): Payer: Self-pay

## 2014-06-30 ENCOUNTER — Other Ambulatory Visit (HOSPITAL_COMMUNITY): Payer: Disability Insurance

## 2014-06-30 ENCOUNTER — Encounter (HOSPITAL_COMMUNITY)
Admission: RE | Admit: 2014-06-30 | Discharge: 2014-06-30 | Disposition: A | Discharge status: Home / Self Care | Payer: Medicaid Other | Source: Ambulatory Visit | Attending: Cardiovascular Disease | Admitting: Cardiovascular Disease

## 2014-06-30 ENCOUNTER — Ambulatory Visit (HOSPITAL_COMMUNITY)
Admission: RE | Admit: 2014-06-30 | Discharge: 2014-06-30 | Disposition: A | Discharge status: Home / Self Care | Payer: Medicaid Other | Source: Ambulatory Visit | Attending: Cardiovascular Disease | Admitting: Cardiovascular Disease

## 2014-06-30 DIAGNOSIS — R079 Chest pain, unspecified: Secondary | ICD-10-CM | POA: Insufficient documentation

## 2014-06-30 DIAGNOSIS — I10 Essential (primary) hypertension: Secondary | ICD-10-CM | POA: Insufficient documentation

## 2014-06-30 DIAGNOSIS — E669 Obesity, unspecified: Secondary | ICD-10-CM | POA: Insufficient documentation

## 2014-06-30 DIAGNOSIS — Z8249 Family history of ischemic heart disease and other diseases of the circulatory system: Secondary | ICD-10-CM

## 2014-06-30 MED ORDER — REGADENOSON 0.4 MG/5ML IV SOLN
INTRAVENOUS | Status: AC
  Administered 2014-06-30: 0.4 mg via INTRAVENOUS
  Filled 2014-06-30: qty 5

## 2014-06-30 MED ORDER — SODIUM CHLORIDE 0.9 % IJ SOLN
10.0000 mL | INTRAMUSCULAR | Status: DC | PRN
Start: 2014-06-30 — End: 2014-07-01
  Administered 2014-06-30: 10 mL via INTRAVENOUS
  Filled 2014-06-30: qty 10

## 2014-06-30 MED ORDER — TECHNETIUM TC 99M SESTAMIBI GENERIC - CARDIOLITE
25.0000 | Freq: Once | INTRAVENOUS | Status: AC | PRN
  Administered 2014-06-30: 25 via INTRAVENOUS

## 2014-06-30 MED ORDER — SODIUM CHLORIDE 0.9 % IJ SOLN
INTRAMUSCULAR | Status: AC
  Administered 2014-06-30: 10 mL via INTRAVENOUS
  Filled 2014-06-30: qty 3

## 2014-06-30 MED ORDER — REGADENOSON 0.4 MG/5ML IV SOLN
0.4000 mg | Freq: Once | INTRAVENOUS | Status: AC | PRN
  Administered 2014-06-30: 0.4 mg via INTRAVENOUS

## 2014-06-30 MED ORDER — SODIUM CHLORIDE 0.9 % IJ SOLN
INTRAMUSCULAR | Status: AC
  Filled 2014-06-30: qty 42

## 2014-06-30 MED ORDER — PERFLUTREN LIPID MICROSPHERE
1.0000 mL | INTRAVENOUS | Status: AC | PRN
Start: 2014-06-30 — End: 2014-06-30
  Administered 2014-06-30: 2 mL via INTRAVENOUS
  Administered 2014-06-30: 1 mL via INTRAVENOUS
  Administered 2014-06-30: 2 mL via INTRAVENOUS
  Administered 2014-06-30: 1 mL via INTRAVENOUS
  Filled 2014-06-30: qty 10

## 2014-06-30 NOTE — Progress Notes (Signed)
  Echocardiogram 2D Echocardiogram has been performed.  Bradley Torres 06/30/2014, 2:42 PM

## 2014-06-30 NOTE — Progress Notes (Signed)
Stress Lab Nurses Notes - [place] 06/30/2014 Reason for doing test: Chest Pain Type of test: Leane Call / 2 Day Study Nurse performing test: Gerrit Halls, RN Nuclear Medicine Tech: Redmond Baseman Echo Tech: Not Applicable MD performing test: Koneswaran/K.Purcell Nails NP Family MD: Sabra Heck Test explained and consent signed: Yes.   IV started: Saline lock flushed, No redness or edema and Saline lock started in radiology Symptoms: headache Treatment/Intervention: None Reason test stopped: protocol completed After recovery IV was: Discontinued via X-ray tech and No redness or edema Patient to return to Nuc. Med at : 12:30 Patient discharged: Home Patient's Condition upon discharge was: stable Comments: During test BP 134/78  & HR 71.  Recovery BP 132/80 & HR 75.  Symptoms resolved in recovery. Geanie Cooley T

## 2014-07-01 ENCOUNTER — Telehealth: Payer: Self-pay | Admitting: *Deleted

## 2014-07-01 ENCOUNTER — Encounter (HOSPITAL_COMMUNITY)
Admission: RE | Admit: 2014-07-01 | Discharge: 2014-07-01 | Disposition: A | Discharge status: Home / Self Care | Payer: Medicaid Other | Source: Ambulatory Visit | Attending: Cardiovascular Disease | Admitting: Cardiovascular Disease

## 2014-07-01 DIAGNOSIS — E669 Obesity, unspecified: Secondary | ICD-10-CM | POA: Diagnosis not present

## 2014-07-01 DIAGNOSIS — R079 Chest pain, unspecified: Secondary | ICD-10-CM | POA: Diagnosis not present

## 2014-07-01 DIAGNOSIS — I10 Essential (primary) hypertension: Secondary | ICD-10-CM | POA: Diagnosis not present

## 2014-07-01 MED ORDER — TECHNETIUM TC 99M SESTAMIBI GENERIC - CARDIOLITE
25.0000 | Freq: Once | INTRAVENOUS | Status: AC | PRN
  Administered 2014-07-01: 25 via INTRAVENOUS

## 2014-07-01 NOTE — Telephone Encounter (Signed)
-----   Message from [place] sent at 07/01/2014 10:49 AM EDT -----   ----- Message -----    From: Herminio Commons, MD    Sent: 06/30/2014   4:08 PM      To: Staci T Ashworth, CMA  Normal pumping function.

## 2014-07-01 NOTE — Telephone Encounter (Signed)
Notes Recorded by Laurine Blazer, LPN on 06/30/8481 at 50:75 AM Patient notified via voice mail.

## 2014-07-08 ENCOUNTER — Ambulatory Visit
Admission: RE | Admit: 2014-07-08 | Discharge: 2014-07-08 | Disposition: A | Discharge status: Home / Self Care | Payer: Medicaid Other | Source: Ambulatory Visit | Attending: Family Medicine | Admitting: Family Medicine

## 2014-07-08 DIAGNOSIS — M25512 Pain in left shoulder: Secondary | ICD-10-CM

## 2014-07-09 NOTE — Progress Notes (Signed)
Lmtcb/ww 4/13

## 2014-07-21 ENCOUNTER — Encounter: Payer: Self-pay | Admitting: Family Medicine

## 2014-07-21 ENCOUNTER — Ambulatory Visit (INDEPENDENT_AMBULATORY_CARE_PROVIDER_SITE_OTHER): Payer: Medicaid Other | Admitting: Family Medicine

## 2014-07-21 VITALS — BP 165/87 | HR 74 | Temp 97.9°F | Ht 69.0 in | Wt >= 6400 oz

## 2014-07-21 DIAGNOSIS — I10 Essential (primary) hypertension: Secondary | ICD-10-CM | POA: Diagnosis not present

## 2014-07-21 MED ORDER — BISOPROLOL-HYDROCHLOROTHIAZIDE 10-6.25 MG PO TABS: 1.0000 | ORAL_TABLET | Freq: Every day | ORAL | Status: DC

## 2014-07-21 MED ORDER — HYDROCODONE-ACETAMINOPHEN 10-325 MG PO TABS: 1.0000 | ORAL_TABLET | Freq: Four times a day (QID) | ORAL | Status: DC | PRN

## 2014-07-21 MED ORDER — LOSARTAN POTASSIUM 100 MG PO TABS: 100.0000 mg | ORAL_TABLET | Freq: Every day | ORAL | Status: DC

## 2014-07-21 MED ORDER — IRBESARTAN 150 MG PO TABS: 150.0000 mg | ORAL_TABLET | Freq: Every day | ORAL | Status: DC

## 2014-07-21 NOTE — Patient Instructions (Signed)
Rotator Cuff Tear The rotator cuff is four tendons that assist in the motion of the shoulder. A rotator cuff tear is a tear in one of these four tendons. It is characterized by pain and weakness of the shoulder. The rotator cuff tendons surround the shoulder ball and socket joint (humeral head). The rotator cuff tendons attach to the shoulder blade (scapula) on one side and the upper arm bone (humerus) on the other side. The rotator cuff is essential for shoulder stability and shoulder motion. SYMPTOMS   Pain around the shoulder, often at the outer portion of the upper arm.  Pain that is worse with shoulder function, especially when reaching overhead or lifting.  Weakness of the shoulder muscles.  Aching when not using your arm; often, pain awakens you at night, especially when sleeping on the affected side.  Tenderness, swelling, warmth, or redness over the outer aspect of the shoulder.  Loss of strength.  Limited motion of the shoulder, especially reaching behind (reaching into one's back pocket) or across your body.  A crackling sound (crepitation) when moving the shoulder.  Biceps tendon pain (in the front of the shoulder) and inflammation, worse with bending the elbow or lifting. CAUSES   Strain from sudden increase in amount or intensity of activity.  Direct blow or injury to the shoulder.  Aging, wear from from normal use.  Roof of the shoulder (acromial) spur. RISK INCREASES WITH:   Contact sports (football, wrestling, or boxing).  Throwing or hitting sports (baseball, tennis, or volleyball).  Weightlifting and bodybuilding.  Heavy labor.  Previous injury to rotator cuff.  Failure to warm up properly before activity.  Inadequate protective equipment.  Increasing age.  Spurring of the outer end of the scapula (acromion).  Cortisone injections.  Poor shoulder strength and flexibility. PREVENTION  Warm up and stretch properly before activity.  Allow time  for rest and recovery between practices and competition.  Maintain physical fitness:  Cardiovascular fitness.  Shoulder flexibility.  Strength and endurance of the rotator cuff muscles and muscles of the shoulder blade.  Learn and use proper technique when throwing or hitting. PROGNOSIS Surgery is often needed. Although, symptoms may go away by themselves. RELATED COMPLICATIONS   Persistent pain that may progress to constant pain.  Shoulder stiffness, frozen shoulder syndrome, or loss of motion.  Recurrence of symptoms, especially if treated without surgery.  Inability to return to same level of sports, even with surgery.  Persistent weakness.  Risks of surgery, including infection, bleeding, injury to nerves, shoulder stiffness, weakness, re-tearing of the rotator cuff tendon.  Deltoid detachment, acromial fracture, and persistent pain. TREATMENT Treatment involves the use of ice and medicine to reduce pain and inflammation. Strengthening and stretching exercise are usually recommended. These exercises may be completed at home or with a therapist. You may also be instructed to modify offending activities. Corticosteroid injections may be given to reduce inflammation. Surgery is usually recommended for athletes. Surgery has the best chance for a full recovery. Surgery involves:  Removal of an inflamed bursa.  Removal of an acromial spur if present.  Suturing the torn tendon back together. Rotator cuff surgeries may be preformed either arthroscopically or through an open incision. Recovery typically takes 6 to 12 months. MEDICATION  If pain medicine is necessary, then nonsteroidal anti-inflammatory medicines, such as aspirin and ibuprofen, or other minor pain relievers, such as acetaminophen, are often recommended.  Do not take pain medicine for 7 days before surgery.  Prescription pain relievers are usually  only prescribed after surgery. Use only as directed and only as  much as you need. °· Corticosteroid injections may be given to reduce inflammation. However, there is a limited number of times the joint may be injected with these medicines. °HEAT AND COLD °· Cold treatment (icing) relieves pain and reduces inflammation. Cold treatment should be applied for 10 to 15 minutes every 2 to 3 hours for inflammation and pain and immediately after any activity that aggravates your symptoms. Use ice packs or massage the area with a piece of ice (ice massage). °· Heat treatment may be used prior to performing the stretching and strengthening activities prescribed by your caregiver, physical therapist, or athletic trainer. Use a heat pack or soak the injury in warm water. °SEEK MEDICAL CARE IF:  °· Symptoms get worse or do not improve in 4 to 6 weeks despite treatment. °· You experience pain, numbness, or coldness in the hand. °· Blue, gray, or dark color appears in the fingernails. °· New, unexplained symptoms develop (drugs used in treatment may produce side effects). °Document Released: 03/14/2005 Document Revised: 06/06/2011 Document Reviewed: 06/26/2008 °ExitCare® Patient Information ©2015 [employer]. This information is not intended to replace advice given to you by your health care provider. Make sure you discuss any questions you have with your health care provider. ° °

## 2014-07-21 NOTE — Progress Notes (Signed)
   Subjective:    Patient ID: Bradley Torres, male    DOB: 10-27-1960, 54 y.o.   MRN: [id number]  HPI 54 year old male here for follow-up shoulder injury. Since his last visit he underwent MRI of left shoulder which showed complete tear both supra and infraspinatus tendons. This will require surgery to repair. There is been some question about his cardiac status.. Apparently he underwent stress testing the results are unofficially that it was normal but I have not seen the written report. We will go ahead and make him an appointment with [facility] for shoulder repair.  Chief Complaint  Patient presents with  . Hypertension    Patient has been out of medication for a few days but reports that his blood pressure has improved since increasing losartan.,  . Results    MRI Results    Patient Active Problem List   Diagnosis Date Noted  . Essential hypertension 01/23/2014  . Back pain 01/23/2014  . Severe obesity (BMI >= 40) 01/23/2014   Outpatient Encounter Prescriptions as of 07/21/2014  Medication Sig  . bisoprolol-hydrochlorothiazide (ZIAC) 10-6.25 MG per tablet Take 1 tablet by mouth daily.  Marland Kitchen HYDROcodone-acetaminophen (NORCO) 10-325 MG per tablet Take 1 tablet by mouth every 6 (six) hours as needed.  Marland Kitchen losartan (COZAAR) 50 MG tablet Take 1 tablet (50 mg total) by mouth daily. (Patient taking differently: Take 100 mg by mouth daily. )  . terbinafine (LAMISIL) 250 MG tablet Take 1 tablet (250 mg total) by mouth daily.     Review of Systems  Constitutional: Negative.   Respiratory: Negative.   Cardiovascular: Positive for chest pain.  Musculoskeletal: Positive for arthralgias.  Neurological: Negative.   Psychiatric/Behavioral: Negative.        Objective:   Physical Exam  Constitutional: He is oriented to person, place, and time.  Morbid obesity  Cardiovascular: Normal rate and normal heart sounds.   Pulmonary/Chest: Effort normal and breath sounds normal.    Musculoskeletal:  Left shoulder shows marked decreased range of motion and any movement at all causes pain  Neurological: He is alert and oriented to person, place, and time.  Psychiatric: He has a normal mood and affect.    BP 165/87 mmHg  Pulse 74  Temp(Src) 97.9 F (36.6 C) (Oral)  Ht 5\' 9"  (1.753 m)  Wt 404 lb (183.253 kg)  BMI 59.63 kg/m2       Assessment & Plan:  1. Essential hypertension Had previously changed to losartan but will now change to losartan to get better 24-hour coverage continue with Ziac as well  2 rotator cuff tear Clinically, patient had this injury and is now confirmed with MRI. He will need surgery  Wardell Honour MD - Ambulatory referral to Orthopedic Surgery

## 2014-07-25 ENCOUNTER — Encounter: Payer: Self-pay | Admitting: Cardiovascular Disease

## 2014-07-25 ENCOUNTER — Ambulatory Visit (INDEPENDENT_AMBULATORY_CARE_PROVIDER_SITE_OTHER): Payer: Medicaid Other | Admitting: Cardiovascular Disease

## 2014-07-25 VITALS — BP 130/64 | HR 73 | Ht 71.0 in | Wt >= 6400 oz

## 2014-07-25 DIAGNOSIS — R079 Chest pain, unspecified: Secondary | ICD-10-CM | POA: Diagnosis not present

## 2014-07-25 DIAGNOSIS — I10 Essential (primary) hypertension: Secondary | ICD-10-CM

## 2014-07-25 DIAGNOSIS — Z8249 Family history of ischemic heart disease and other diseases of the circulatory system: Secondary | ICD-10-CM

## 2014-07-25 DIAGNOSIS — R0609 Other forms of dyspnea: Secondary | ICD-10-CM

## 2014-07-25 MED ORDER — RANOLAZINE ER 500 MG PO TB12: 500.0000 mg | ORAL_TABLET | Freq: Two times a day (BID) | ORAL | Status: DC

## 2014-07-25 NOTE — Patient Instructions (Addendum)
Your physician recommends that you schedule a follow-up appointment in: 2-3 MONTHS Dr. Bronson Ing  Your physician has recommended you make the following change in your medication:   START ASPIRIN 81 MG   START RANEXA 500 MG TWICE DAILY  CONTINUE ALL OTHER MEDICATIONS AS DIRECTED   Thank you for choosing [facility]!!

## 2014-07-25 NOTE — Progress Notes (Signed)
Patient ID: Bradley Torres, male   DOB: 22-Dec-1960, 54 y.o.   MRN: [id number]      SUBJECTIVE: The patient returns for follow-up after undergoing cardiovascular testing performed for evaluation of chest pain. Nuclear stress testing demonstrated probably normal myocardial perfusion with no evidence of reversible ischemia or infarction with soft tissue attenuation artifact seen. Wall motion was normal, LVEF 50%. Overall, it was a low risk study. Echocardiogram demonstrated normal left ventricular systolic function and regional wall motion, EF 55-60%, with mild LVH.  He continues to have left precordial chest pain which can occur both with and without exertion. He says it is sometimes constant and lasted for days at a time. He refuses to take sublingual nitroglycerin as he had an adverse reaction to it when given to him in the hospital. He only has acid reflux at night after he lies down.   Review of Systems: As per "subjective", otherwise negative.  Allergies  Allergen Reactions  . Ibuprofen     Feels that it caused rectal bleeding    Current Outpatient Prescriptions  Medication Sig Dispense Refill  . bisoprolol-hydrochlorothiazide (ZIAC) 10-6.25 MG per tablet Take 1 tablet by mouth daily. 90 tablet 1  . HYDROcodone-acetaminophen (NORCO) 10-325 MG per tablet Take 1 tablet by mouth every 6 (six) hours as needed. 90 tablet 0  . irbesartan (AVAPRO) 150 MG tablet Take 1 tablet (150 mg total) by mouth daily. 90 tablet 1  . terbinafine (LAMISIL) 250 MG tablet Take 1 tablet (250 mg total) by mouth daily. 71 tablet 1   No current facility-administered medications for this visit.    Past Medical History  Diagnosis Date  . Essential hypertension   . Panic disorder   . Prediabetes   . Morbid obesity   . Shoulder pain, left     Following fall  . Chronic back pain     Past Surgical History  Procedure Laterality Date  . Lumbar spine surgery    . Ankle fracture sugery Left     History    Social History  . Marital Status: Single    Spouse Name: N/A  . Number of Children: N/A  . Years of Education: N/A   Occupational History  . Not on file.   Social History Main Topics  . Smoking status: Never Smoker   . Smokeless tobacco: Never Used  . Alcohol Use: No     Comment: occ  . Drug Use: No  . Sexual Activity: Not on file   Other Topics Concern  . Not on file   Social History Narrative     Filed Vitals:   07/25/14 1351  BP: 130/64  Pulse: 73  Height: 5\' 11"  (1.803 m)  Weight: 409 lb (185.521 kg)  SpO2: 96%    PHYSICAL EXAM General: NAD, morbidly obese Neck: Difficult to assess JVP. Lungs: Faint inspiratory and expiratory wheezes. CV: Regular rate and rhythm, normal S1/S2, no S3/S4, no murmur. No peripheral edema. No carotid bruit.  Abdomen: Morbidly obese. Skin: Intact without lesions or rashes.  Neurologic: Alert and oriented x 3.  Psych: Normal affect. Extremities: No clubbing or cyanosis.  HEENT: Normal.   ECG: Most recent ECG reviewed.      ASSESSMENT AND PLAN: 1. Chest pain: Normal stress test and normal LV systolic function and regional wall motion as noted above. No further testing is indicated. Atypical features for CAD. Refuses to try SL nitroglycerin. Will prescribe ASA 81 mg daily and give a trial of Ranexa 500 mg  bid.  2. Essential HTN: Well controlled. No changes.  Dispo: f/u 2-3 months.  Kate Sable, M.D., F.A.C.C.

## 2014-07-29 ENCOUNTER — Other Ambulatory Visit: Payer: Self-pay

## 2014-07-29 ENCOUNTER — Telehealth: Payer: Self-pay

## 2014-07-29 NOTE — Telephone Encounter (Signed)
can change to Diovan, 160 mg/day

## 2014-07-29 NOTE — Telephone Encounter (Signed)
Dr Sabra Heck you changed patient ot Cozaar which is not preferred for Medicaid and to qualify he must have tried and failed Diovan and Losartan .  I only that he has used Losarten  Please advise?

## 2014-07-30 MED ORDER — VALSARTAN 160 MG PO TABS: 160.0000 mg | ORAL_TABLET | Freq: Every day | ORAL | Status: DC

## 2014-07-31 ENCOUNTER — Telehealth: Payer: Self-pay | Admitting: Family Medicine

## 2014-07-31 ENCOUNTER — Other Ambulatory Visit: Payer: Self-pay

## 2014-07-31 MED ORDER — VALSARTAN 160 MG PO TABS
ORAL_TABLET | ORAL | Status: DC
Start: 2014-07-31 — End: 2014-08-06

## 2014-08-01 NOTE — Telephone Encounter (Signed)
Printed and gave to debbi on 07/31/14

## 2014-08-06 ENCOUNTER — Telehealth: Payer: Self-pay | Admitting: Family Medicine

## 2014-08-06 MED ORDER — VALSARTAN 160 MG PO TABS: ORAL_TABLET | ORAL | Status: DC

## 2014-08-06 NOTE — Telephone Encounter (Signed)
Pharmacy is calling in regards to Diovan. If you are requesting Diovan to be name brand it must say in the sig brand name medically necessary.

## 2014-08-06 NOTE — Telephone Encounter (Signed)
Diovan was prescribed medicated request and does not have to be trade name. The generic is fine with me

## 2014-08-06 NOTE — Telephone Encounter (Signed)
Pharmacy aware dr. Sabra Heck is ok with generic

## 2014-08-06 NOTE — Addendum Note (Signed)
Addended by: Thana Ates on: 08/06/2014 02:34 PM   Modules accepted: Orders

## 2014-08-26 ENCOUNTER — Encounter: Payer: Self-pay | Admitting: Nurse Practitioner

## 2014-08-26 ENCOUNTER — Telehealth: Payer: Self-pay | Admitting: Family Medicine

## 2014-08-26 ENCOUNTER — Ambulatory Visit (INDEPENDENT_AMBULATORY_CARE_PROVIDER_SITE_OTHER): Payer: Medicaid Other | Admitting: Nurse Practitioner

## 2014-08-26 VITALS — BP 160/99 | HR 93 | Temp 98.4°F | Ht 71.0 in | Wt >= 6400 oz

## 2014-08-26 DIAGNOSIS — R21 Rash and other nonspecific skin eruption: Secondary | ICD-10-CM | POA: Diagnosis not present

## 2014-08-26 DIAGNOSIS — I10 Essential (primary) hypertension: Secondary | ICD-10-CM

## 2014-08-26 MED ORDER — LOSARTAN POTASSIUM 100 MG PO TABS: 100.0000 mg | ORAL_TABLET | Freq: Every day | ORAL | Status: DC

## 2014-08-26 MED ORDER — DIOVAN 160 MG PO TABS: ORAL_TABLET | ORAL | Status: DC

## 2014-08-26 NOTE — Telephone Encounter (Signed)
Appointment given for 6 today by cynthia.

## 2014-08-26 NOTE — Progress Notes (Signed)
   Subjective:    Patient ID: Bradley Torres, male    DOB: 03/21/1961, 54 y.o.   MRN: [id number]  HPI Patient in today c/o rash on bil lower legs- has sores all over his lower legs- has been scratching and most of sores are open. He was stated on losartan right before rash broke out and med made hi feel bad so he stopped taking. He thinks that is what caused the rash.    Review of Systems  Constitutional: Negative.   HENT: Negative.   Respiratory: Negative.   Cardiovascular: Negative.   Genitourinary: Negative.   Neurological: Negative.   Psychiatric/Behavioral: Negative.   All other systems reviewed and are negative.      Objective:   Physical Exam  Constitutional: He is oriented to person, place, and time. He appears well-developed and well-nourished.  Cardiovascular: Normal rate, regular rhythm and normal heart sounds.   Neurological: He is alert and oriented to person, place, and time.  Skin: Skin is warm.  Psychiatric: He has a normal mood and affect. His behavior is normal. Judgment and thought content normal.   /\BP 160/99 mmHg  Pulse 93  Temp(Src) 98.4 F (36.9 C) (Oral)  Ht 5\' 11"  (1.803 m)  Wt 404 lb 9.6 oz (183.525 kg)  BMI 56.46 kg/m2        Assessment & Plan:   1. Essential hypertension   2. Rash and nonspecific skin eruption    Meds ordered this encounter  Medications  . DISCONTD: DIOVAN 160 MG tablet    Sig: One tablet daily brand name medically necessary    Dispense:  30 tablet    Refill:  2    Order Specific Question:  Supervising Provider    Answer:  Chipper Herb [[id number]]  . losartan (COZAAR) 100 MG tablet    Sig: Take 1 tablet (100 mg total) by mouth daily.    Dispense:  90 tablet    Refill:  1    Order Specific Question:  Supervising Provider    Answer:  Chipper Herb [[id number]]   Changed irbesartan back to losartan which patient said he had no problems taking.  diovan was on his chart but patient said he was not taking. He will try to  keep a check of blood pressure  Mary-Margaret Hassell Done, [name]

## 2014-08-26 NOTE — Patient Instructions (Signed)

## 2014-08-27 NOTE — Telephone Encounter (Signed)
Pt given appt tomorrow with Dr.Miller at 11:30.

## 2014-08-28 ENCOUNTER — Telehealth: Payer: Self-pay

## 2014-08-28 ENCOUNTER — Encounter: Payer: Self-pay | Admitting: Family Medicine

## 2014-08-28 ENCOUNTER — Ambulatory Visit (INDEPENDENT_AMBULATORY_CARE_PROVIDER_SITE_OTHER): Payer: Medicaid Other | Admitting: Family Medicine

## 2014-08-28 VITALS — BP 146/90 | HR 70 | Temp 98.8°F | Ht 71.0 in | Wt 399.0 lb

## 2014-08-28 DIAGNOSIS — I10 Essential (primary) hypertension: Secondary | ICD-10-CM | POA: Diagnosis not present

## 2014-08-28 NOTE — Telephone Encounter (Signed)
Diovan 160 mg approved through 08/21/15

## 2014-08-28 NOTE — Progress Notes (Signed)
   Subjective:    Patient ID: Bradley Torres, male    DOB: June 25, 1960, 54 y.o.   MRN: [id number]  HPI patient was placed back on losartan at his request and he thinks his symptoms are improving. Rash is better. He still feels some weakness and lack of energy but since medicines have been adjusted several times recently I would like to let things stabilize before we launch into further workup.  He was seen by the orthopedic doctor who recommended physical therapy before surgery for rotator cuff. PT saw him once and gave him some exercises and stretching bands 2 use at home and he has follow-up appointment with orthopedist  Patient Active Problem List   Diagnosis Date Noted  . Essential hypertension 01/23/2014  . Back pain 01/23/2014  . Severe obesity (BMI >= 40) 01/23/2014   Outpatient Encounter Prescriptions as of 08/28/2014  Medication Sig  . bisoprolol-hydrochlorothiazide (ZIAC) 10-6.25 MG per tablet Take 1 tablet by mouth daily.  Marland Kitchen HYDROcodone-acetaminophen (NORCO) 10-325 MG per tablet Take 1 tablet by mouth every 6 (six) hours as needed.  Marland Kitchen losartan (COZAAR) 100 MG tablet Take 1 tablet (100 mg total) by mouth daily.  . ranolazine (RANEXA) 500 MG 12 hr tablet Take 1 tablet (500 mg total) by mouth 2 (two) times daily.  . [DISCONTINUED] aspirin 81 MG tablet Take 81 mg by mouth daily.  . [DISCONTINUED] terbinafine (LAMISIL) 250 MG tablet Take 1 tablet (250 mg total) by mouth daily.   No facility-administered encounter medications on file as of 08/28/2014.      Review of Systems  Respiratory: Negative.   Cardiovascular: Negative.   Gastrointestinal: Negative.   Neurological: Positive for weakness.  Psychiatric/Behavioral: Negative.        Objective:   Physical Exam  Constitutional: He is oriented to person, place, and time. He appears well-developed and well-nourished.  Cardiovascular: Normal rate and regular rhythm.   Pulmonary/Chest: Effort normal and breath sounds normal.    Neurological: He is alert and oriented to person, place, and time.    BP 146/90 mmHg  Pulse 70  Temp(Src) 98.8 F (37.1 C) (Oral)  Ht 5\' 11"  (1.803 m)  Wt 399 lb (180.985 kg)  BMI 55.67 kg/m2       Assessment & Plan:  1. Essential hypertension Doses now include Ziac and losartan. Since he wants to stay on losartan and Norvasc and did take one of the pills in the morning the other night and hopefully that'll be better 24 coverage if he splits times medicine is administered

## 2014-09-26 ENCOUNTER — Ambulatory Visit: Payer: Medicaid Other | Admitting: Cardiovascular Disease

## 2014-10-01 ENCOUNTER — Ambulatory Visit (INDEPENDENT_AMBULATORY_CARE_PROVIDER_SITE_OTHER): Payer: Medicaid Other | Admitting: Family Medicine

## 2014-10-01 ENCOUNTER — Encounter: Payer: Self-pay | Admitting: Family Medicine

## 2014-10-01 VITALS — BP 170/109 | HR 65 | Temp 98.0°F | Ht 71.0 in | Wt 399.0 lb

## 2014-10-01 DIAGNOSIS — I10 Essential (primary) hypertension: Secondary | ICD-10-CM

## 2014-10-01 MED ORDER — OXYCODONE-ACETAMINOPHEN 5-325 MG PO TABS: 1.0000 | ORAL_TABLET | Freq: Three times a day (TID) | ORAL | Status: DC | PRN

## 2014-10-01 MED ORDER — LISINOPRIL 20 MG PO TABS: 20.0000 mg | ORAL_TABLET | Freq: Every day | ORAL | Status: DC

## 2014-10-01 NOTE — Addendum Note (Signed)
Addended by: Ilean China on: 10/01/2014 02:49 PM   Modules accepted: Orders

## 2014-10-01 NOTE — Progress Notes (Signed)
   Subjective:    Patient ID: Bradley Torres, male    DOB: [birth year], 54 y.o.   MRN: [id number]  HPI 54 year old gentleman here to follow-up hypertension, chest pain, and shoulder pain. Since his last visit 1 month ago he is driven to [place] and back. Generally he's felt better as his body has adjusted to his blood pressure medicine. He has a long history of chronic back pain and surgery and multiple hardware is in his back and he says it "went out" last week and pain has been worse. At the same time, his blood pressure has been worse  Patient Active Problem List   Diagnosis Date Noted  . Essential hypertension 01/23/2014  . Back pain 01/23/2014  . Severe obesity (BMI >= 40) 01/23/2014   Outpatient Encounter Prescriptions as of 10/01/2014  Medication Sig  . bisoprolol-hydrochlorothiazide (ZIAC) 10-6.25 MG per tablet Take 1 tablet by mouth daily.  Marland Kitchen HYDROcodone-acetaminophen (NORCO) 10-325 MG per tablet Take 1 tablet by mouth every 6 (six) hours as needed.  Marland Kitchen losartan (COZAAR) 100 MG tablet Take 1 tablet (100 mg total) by mouth daily.  . [DISCONTINUED] ranolazine (RANEXA) 500 MG 12 hr tablet Take 1 tablet (500 mg total) by mouth 2 (two) times daily.   No facility-administered encounter medications on file as of 10/01/2014.      Review of Systems  Constitutional: Negative.   HENT: Negative.   Respiratory: Negative.   Cardiovascular: Positive for chest pain.  Musculoskeletal: Positive for back pain.  Psychiatric/Behavioral: Negative.        Objective:   Physical Exam  Constitutional: He is oriented to person, place, and time.  Morbid obesity: Weight unchanged  Cardiovascular: Normal rate and regular rhythm.   Pulmonary/Chest: Effort normal and breath sounds normal.  Neurological: He is alert and oriented to person, place, and time.  Psychiatric: He has a normal mood and affect.   BP 170/109 mmHg  Pulse 65  Temp(Src) 98 F (36.7 C) (Oral)  Ht 5\' 11"  (1.803 m)  Wt 399 lb  (180.985 kg)  BMI 55.67 kg/m2        Assessment & Plan:  1. Essential hypertension Blood pressure is not well controlled today. Patient seems to related to the back pain so will increase his hydrocodone to oxycodone 5 mg 1-2 every 8 hours #30. I've also asked him to monitor his blood pressure. If it is consistently above 135/85, may need to add calcium channel blocker to beta blocker and arb.  Wardell Honour MD

## 2014-10-29 ENCOUNTER — Telehealth: Payer: Self-pay | Admitting: Family Medicine

## 2014-10-29 MED ORDER — BISOPROLOL-HYDROCHLOROTHIAZIDE 10-6.25 MG PO TABS: 1.0000 | ORAL_TABLET | Freq: Every day | ORAL | Status: DC

## 2014-10-29 MED ORDER — LISINOPRIL 20 MG PO TABS: 20.0000 mg | ORAL_TABLET | Freq: Every day | ORAL | Status: DC

## 2014-10-29 MED ORDER — OXYCODONE-ACETAMINOPHEN 5-325 MG PO TABS: 1.0000 | ORAL_TABLET | Freq: Three times a day (TID) | ORAL | Status: DC | PRN

## 2014-10-29 NOTE — Telephone Encounter (Signed)
Refill done.  

## 2014-11-19 ENCOUNTER — Ambulatory Visit (INDEPENDENT_AMBULATORY_CARE_PROVIDER_SITE_OTHER): Payer: Medicaid Other | Admitting: Family Medicine

## 2014-11-19 ENCOUNTER — Encounter: Payer: Self-pay | Admitting: Family Medicine

## 2014-11-19 ENCOUNTER — Telehealth: Payer: Self-pay | Admitting: Family Medicine

## 2014-11-19 VITALS — BP 149/93 | HR 56 | Temp 97.4°F | Ht 71.0 in | Wt >= 6400 oz

## 2014-11-19 DIAGNOSIS — I10 Essential (primary) hypertension: Secondary | ICD-10-CM | POA: Diagnosis not present

## 2014-11-19 MED ORDER — HYZAAR 100-25 MG PO TABS: ORAL_TABLET | ORAL | Status: DC

## 2014-11-19 MED ORDER — AMLODIPINE BESYLATE 5 MG PO TABS: 5.0000 mg | ORAL_TABLET | Freq: Every day | ORAL | Status: DC

## 2014-11-19 MED ORDER — OXYCODONE-ACETAMINOPHEN 10-325 MG PO TABS: 1.0000 | ORAL_TABLET | Freq: Three times a day (TID) | ORAL | Status: DC | PRN

## 2014-11-19 NOTE — Progress Notes (Signed)
   Subjective:    Patient ID: POLK MINOR, male    DOB: 12/19/1960, 54 y.o.   MRN: [id number]  HPI 54 year old gentleman here to follow-up hypertension and back pain. Obesity is a constant and in fact he has gained some weight since last visit here. He brings in a record from [facility] that shows most of his blood pressures high when recorded machine there. We have tried several other antihypertensives in the past with some intolerances to several.  Patient Active Problem List   Diagnosis Date Noted  . Essential hypertension 01/23/2014  . Back pain 01/23/2014  . Severe obesity (BMI >= 40) 01/23/2014   Outpatient Encounter Prescriptions as of 11/19/2014  Medication Sig  . bisoprolol-hydrochlorothiazide (ZIAC) 10-6.25 MG per tablet Take 1 tablet by mouth daily.  Marland Kitchen lisinopril (PRINIVIL,ZESTRIL) 20 MG tablet Take 1 tablet (20 mg total) by mouth daily.  Marland Kitchen oxyCODONE-acetaminophen (ROXICET) 5-325 MG per tablet Take 1-2 tablets by mouth every 8 (eight) hours as needed for severe pain.  Marland Kitchen terbinafine (LAMISIL) 1 % cream Apply 1 application topically daily.   No facility-administered encounter medications on file as of 11/19/2014.      Review of Systems  Constitutional: Positive for fatigue.  Respiratory: Negative.   Cardiovascular: Negative.   Neurological: Negative.        Objective:   Physical Exam  Constitutional:  Morbid obesity  Cardiovascular: Normal rate and regular rhythm.   Pulmonary/Chest: Effort normal and breath sounds normal.          Assessment & Plan:  1. Essential hypertension DME Other see comment

## 2014-11-20 ENCOUNTER — Telehealth: Payer: Self-pay

## 2014-11-20 MED ORDER — VALSARTAN-HYDROCHLOROTHIAZIDE 160-12.5 MG PO TABS: 1.0000 | ORAL_TABLET | Freq: Every day | ORAL | Status: DC

## 2014-11-20 NOTE — Telephone Encounter (Signed)
Changed hyzaar to diovan- needs to be seen to check blood pressure in 2 weeks

## 2014-11-20 NOTE — Telephone Encounter (Signed)
Non preferred with Medicaid Hyzaar 100-25   Preferred meds are Diovan and Losarten- HCTZ

## 2014-11-20 NOTE — Telephone Encounter (Signed)
Left detailed message that new rx for BP sent to pharmacy and for pt to schedule to have BP checked in 2 weeks and to CB with any further questions or concerns.

## 2014-11-25 ENCOUNTER — Telehealth: Payer: Self-pay

## 2014-11-25 MED ORDER — HYDROCHLOROTHIAZIDE 12.5 MG PO CAPS: 12.5000 mg | ORAL_CAPSULE | Freq: Every day | ORAL | Status: DC

## 2014-11-25 MED ORDER — VALSARTAN 160 MG PO TABS: 160.0000 mg | ORAL_TABLET | Freq: Every day | ORAL | Status: DC

## 2014-11-25 NOTE — Telephone Encounter (Signed)
Diovan the preferred med for Medicaid but if you want with HCTZ will need to be two different RX's

## 2014-11-25 NOTE — Telephone Encounter (Signed)
x

## 2014-11-25 NOTE — Telephone Encounter (Signed)
diovan and HCTZ separated into to 2 separate meds so insurance will pay for them

## 2014-12-24 ENCOUNTER — Ambulatory Visit (INDEPENDENT_AMBULATORY_CARE_PROVIDER_SITE_OTHER): Payer: Medicaid Other | Admitting: Family Medicine

## 2014-12-24 ENCOUNTER — Encounter: Payer: Self-pay | Admitting: Family Medicine

## 2014-12-24 ENCOUNTER — Other Ambulatory Visit: Payer: Self-pay | Admitting: Family Medicine

## 2014-12-24 VITALS — BP 128/88 | HR 64 | Temp 97.9°F | Ht 71.0 in | Wt 392.0 lb

## 2014-12-24 DIAGNOSIS — I10 Essential (primary) hypertension: Secondary | ICD-10-CM | POA: Diagnosis not present

## 2014-12-24 MED ORDER — OXYCODONE-ACETAMINOPHEN 10-325 MG PO TABS
1.0000 | ORAL_TABLET | Freq: Three times a day (TID) | ORAL | Status: DC | PRN
Start: 2014-12-24 — End: 2015-04-02

## 2014-12-24 MED ORDER — OXYCODONE-ACETAMINOPHEN 10-325 MG PO TABS: 1.0000 | ORAL_TABLET | Freq: Three times a day (TID) | ORAL | Status: DC | PRN

## 2014-12-24 NOTE — Progress Notes (Signed)
   Subjective:    Patient ID: Bradley Torres, male    DOB: 01/30/1961, 54 y.o.   MRN: [id number]  HPI Patient here today for 1 month follow up on HTN. He took some time to arrive at medicines that agreed with Bradley Torres but now a combination of Diovan amlodipine bizarre prolonged and hydrochlorothiazide appears to be working. Blood pressure today is good for him at 128/88 compared to 149/93 at last visit. His weight is down some 14 pounds in the last month. Generally he is doing better. He still has some back pain and uses oxycodone twice a day for that.      Patient Active Problem List   Diagnosis Date Noted  . Essential hypertension 01/23/2014  . Back pain 01/23/2014  . Severe obesity (BMI >= 40) 01/23/2014   Outpatient Encounter Prescriptions as of 12/24/2014  Medication Sig  . amLODipine (NORVASC) 5 MG tablet Take 1 tablet (5 mg total) by mouth daily.  . bisoprolol-hydrochlorothiazide (ZIAC) 10-6.25 MG per tablet Take 1 tablet by mouth daily.  . hydrochlorothiazide (MICROZIDE) 12.5 MG capsule Take 1 capsule (12.5 mg total) by mouth daily.  Marland Kitchen oxyCODONE-acetaminophen (PERCOCET) 10-325 MG per tablet Take 1 tablet by mouth every 8 (eight) hours as needed for pain.  Marland Kitchen terbinafine (LAMISIL) 1 % cream Apply 1 application topically daily.  . valsartan (DIOVAN) 160 MG tablet Take 1 tablet (160 mg total) by mouth daily.   No facility-administered encounter medications on file as of 12/24/2014.      Review of Systems  Constitutional: Negative.   HENT: Negative.   Eyes: Negative.   Respiratory: Negative.   Cardiovascular: Positive for leg swelling (at night).  Gastrointestinal: Negative.   Endocrine: Negative.   Genitourinary: Negative.   Musculoskeletal: Negative.   Skin: Negative.   Allergic/Immunologic: Negative.   Neurological: Negative.   Hematological: Negative.   Psychiatric/Behavioral: Negative.        Objective:   Physical Exam  Constitutional: He is oriented to person,  place, and time. He appears well-developed and well-nourished.  Morbidly obese  Cardiovascular: Normal rate, regular rhythm and normal heart sounds.   Pulmonary/Chest: Effort normal and breath sounds normal.  Neurological: He is alert and oriented to person, place, and time.  Psychiatric: He has a normal mood and affect. Thought content normal.    BP 128/88 mmHg  Pulse 64  Temp(Src) 97.9 F (36.6 C) (Oral)  Ht $R'5\' 11"'bJ$  (1.803 m)  Wt 392 lb (177.81 kg)  BMI 54.70 kg/m2       Assessment & Plan:  1. Essential hypertension Blood pressure is doing better on current regimen. Should continue same. It has been 1 year since we last checked renal function. He does have some dependent edema days and. I think this is probably related to venous disease rather than renal or cardiac issues. I have suggested he keep his feet elevated. I'm reluctant to use a diarrhetic late in the day thinking it will probably keep him awake at night urinating.  Wardell Honour MD - Lipid panel - BMP8+EGFR

## 2014-12-25 ENCOUNTER — Encounter: Payer: Self-pay | Admitting: Family Medicine

## 2014-12-25 ENCOUNTER — Telehealth: Payer: Self-pay | Admitting: Family Medicine

## 2014-12-25 LAB — BMP8+EGFR
BUN/Creatinine Ratio: 12 (ref 9–20)
BUN: 14 mg/dL (ref 6–24)
CALCIUM: 9.6 mg/dL (ref 8.7–10.2)
CHLORIDE: 98 mmol/L (ref 97–108)
CO2: 26 mmol/L (ref 18–29)
Creatinine, Ser: 1.15 mg/dL (ref 0.76–1.27)
GFR calc Af Amer: 84 mL/min/{1.73_m2} (ref >59)
GFR, EST NON AFRICAN AMERICAN: 72 mL/min/{1.73_m2} (ref >59)
Glucose: 116 mg/dL — ABNORMAL HIGH (ref 65–99)
Potassium: 4.5 mmol/L (ref 3.5–5.2)
Sodium: 140 mmol/L (ref 134–144)

## 2014-12-25 LAB — LIPID PANEL
CHOLESTEROL TOTAL: 208 mg/dL — AB (ref 100–199)
Chol/HDL Ratio: 5 ratio units (ref 0.0–5.0)
HDL: 42 mg/dL (ref >39)
LDL Calculated: 143 mg/dL — ABNORMAL HIGH (ref 0–99)
Triglycerides: 115 mg/dL (ref 0–149)
VLDL Cholesterol Cal: 23 mg/dL (ref 5–40)

## 2014-12-25 NOTE — Telephone Encounter (Signed)
Patient aware of results.

## 2014-12-27 ENCOUNTER — Other Ambulatory Visit: Payer: Self-pay | Admitting: Family Medicine

## 2015-01-27 ENCOUNTER — Other Ambulatory Visit: Payer: Self-pay | Admitting: Nurse Practitioner

## 2015-02-17 ENCOUNTER — Other Ambulatory Visit: Payer: Self-pay | Admitting: Nurse Practitioner

## 2015-02-24 ENCOUNTER — Telehealth: Payer: Self-pay | Admitting: Family Medicine

## 2015-02-24 NOTE — Telephone Encounter (Signed)
denied °

## 2015-02-26 ENCOUNTER — Other Ambulatory Visit: Payer: Self-pay | Admitting: Family Medicine

## 2015-04-02 ENCOUNTER — Encounter: Payer: Self-pay | Admitting: Family Medicine

## 2015-04-02 ENCOUNTER — Ambulatory Visit (INDEPENDENT_AMBULATORY_CARE_PROVIDER_SITE_OTHER): Payer: Medicaid Other | Admitting: Family Medicine

## 2015-04-02 DIAGNOSIS — I10 Essential (primary) hypertension: Secondary | ICD-10-CM | POA: Diagnosis not present

## 2015-04-02 MED ORDER — OXYCODONE-ACETAMINOPHEN 10-325 MG PO TABS: 1.0000 | ORAL_TABLET | Freq: Three times a day (TID) | ORAL | Status: DC

## 2015-04-02 NOTE — Progress Notes (Signed)
   Subjective:    Patient ID: Bradley Torres, male    DOB: June 23, 1960, 55 y.o.   MRN: [id number]  HPI 55 year old gentleman with morbid obesity, hypertension, and chronic back pain. Regarding the back pain, symptoms of been controlled on oxycodone. I discussed with him today he drug office policy and he has no problems with that. Blood pressure has been well controlled on 3 drug regimen of beta blocker calcium channel blocker and ARB. Note that weight is up some 12 pounds since last visit.  Patient Active Problem List   Diagnosis Date Noted  . Essential hypertension 01/23/2014  . Back pain 01/23/2014  . Severe obesity (BMI >= 40) ([place]) 01/23/2014   Outpatient Encounter Prescriptions as of 04/02/2015  Medication Sig  . amLODipine (NORVASC) 5 MG tablet TAKE 1 TABLET BY MOUTH EVERY DAY  . bisoprolol-hydrochlorothiazide (ZIAC) 10-6.25 MG per tablet Take 1 tablet by mouth daily.  . bisoprolol-hydrochlorothiazide (ZIAC) 10-6.25 MG tablet TAKE 1 TABLET BY MOUTH DAILY.  . hydrochlorothiazide (MICROZIDE) 12.5 MG capsule Take 1 capsule (12.5 mg total) by mouth daily.  Marland Kitchen lisinopril (PRINIVIL,ZESTRIL) 20 MG tablet TAKE 1 TABLET (20 MG TOTAL) BY MOUTH DAILY.  Marland Kitchen oxyCODONE-acetaminophen (PERCOCET) 10-325 MG tablet Take 1 tablet by mouth every 8 (eight) hours as needed for pain. Do not fill until 01-23-15  . terbinafine (LAMISIL) 1 % cream Apply 1 application topically daily.  . valsartan (DIOVAN) 160 MG tablet Take 1 tablet (160 mg total) by mouth daily.  . valsartan-hydrochlorothiazide (DIOVAN-HCT) 160-12.5 MG tablet TAKE 1 TABLET BY MOUTH DAILY.   No facility-administered encounter medications on file as of 04/02/2015.      Review of Systems  Constitutional: Positive for unexpected weight change.  HENT: Negative.   Respiratory: Negative.   Cardiovascular: Negative.   Neurological: Negative.   Psychiatric/Behavioral: Negative.        Objective:   Physical Exam  Constitutional: He is oriented to  person, place, and time. He appears well-developed and well-nourished.  HENT:  Head: Normocephalic.  Cardiovascular: Normal rate and regular rhythm.   Pulmonary/Chest: Effort normal and breath sounds normal.  Neurological: He is alert and oriented to person, place, and time.  Psychiatric: He has a normal mood and affect. His behavior is normal.          Assessment & Plan:  1. Morbid obesity, unspecified obesity type ([place]) Age weight loss but this is not likely to happen. His weight does go up and down from visit to visit  2. Essential hypertension Blood pressure is well controlled on current regimen of 3 drugs. Continue same  Wardell Honour MD

## 2015-06-15 ENCOUNTER — Other Ambulatory Visit: Payer: Self-pay | Admitting: Family Medicine

## 2015-07-23 ENCOUNTER — Ambulatory Visit (INDEPENDENT_AMBULATORY_CARE_PROVIDER_SITE_OTHER): Payer: Medicaid Other | Admitting: Family Medicine

## 2015-07-23 ENCOUNTER — Encounter: Payer: Self-pay | Admitting: Family Medicine

## 2015-07-23 VITALS — Temp 97.6°F | Ht 71.0 in | Wt >= 6400 oz

## 2015-07-23 DIAGNOSIS — I10 Essential (primary) hypertension: Secondary | ICD-10-CM

## 2015-07-23 DIAGNOSIS — M545 Low back pain: Secondary | ICD-10-CM | POA: Diagnosis not present

## 2015-07-23 MED ORDER — OXYCODONE-ACETAMINOPHEN 10-325 MG PO TABS: 1.0000 | ORAL_TABLET | Freq: Three times a day (TID) | ORAL | Status: DC

## 2015-07-23 MED ORDER — OXYCODONE-ACETAMINOPHEN 10-325 MG PO TABS: ORAL_TABLET | ORAL | Status: DC

## 2015-07-23 MED ORDER — TERBINAFINE HCL 250 MG PO TABS: 250.0000 mg | ORAL_TABLET | Freq: Every day | ORAL | Status: DC

## 2015-07-23 NOTE — Progress Notes (Signed)
   Subjective:    Patient ID: Bradley Torres, male    DOB: 09/27/1960, 55 y.o.   MRN: [id number]  HPI 55 year old gentleman here to follow-up hypertension and obesity. Regarding obesity his weight is about the same that's somewhat of a victory because he has not gained weight. Blood pressures are finally fairly well controlled on a 3 drug regimen including beta blocker arb, and calcium channel blocker. He has no side effects with this medicine. He does report some recurrence of his tinea infection.  Patient Active Problem List   Diagnosis Date Noted  . Essential hypertension 01/23/2014  . Back pain 01/23/2014  . Severe obesity (BMI >= 40) ([place]) 01/23/2014   Outpatient Encounter Prescriptions as of 07/23/2015  Medication Sig  . amLODipine (NORVASC) 5 MG tablet TAKE 1 TABLET BY MOUTH EVERY DAY  . bisoprolol-hydrochlorothiazide (ZIAC) 10-6.25 MG per tablet Take 1 tablet by mouth daily.  Marland Kitchen oxyCODONE-acetaminophen (PERCOCET) 10-325 MG tablet Take 1 tablet by mouth 3 (three) times daily.  Marland Kitchen oxyCODONE-acetaminophen (PERCOCET) 10-325 MG tablet Take 1 tablet by mouth 3 (three) times daily.  Marland Kitchen oxyCODONE-acetaminophen (PERCOCET) 10-325 MG tablet Take 1 tablet by mouth 3 (three) times daily.  Marland Kitchen terbinafine (LAMISIL) 1 % cream Apply 1 application topically daily. Reported on 04/02/2015  . valsartan-hydrochlorothiazide (DIOVAN-HCT) 160-12.5 MG tablet TAKE 1 TABLET BY MOUTH EVERY DAY   No facility-administered encounter medications on file as of 07/23/2015.      Review of Systems  Constitutional: Negative.   Respiratory: Negative.   Cardiovascular: Negative.   Gastrointestinal: Negative.   Genitourinary: Negative.   Neurological: Negative.   Psychiatric/Behavioral: Negative.        Objective:   Physical Exam  Constitutional: He is oriented to person, place, and time. He appears well-developed and well-nourished.  Patient is morbidly obese  Cardiovascular: Normal rate and regular rhythm.     Pulmonary/Chest: Effort normal and breath sounds normal.  Given his rather large size and neck questioned him about possibility of sleep apnea. He has had no complaints of snoring or apnea and although he does feel tired in the daytime probably not enough evidence to send for a sleep study.  Neurological: He is alert and oriented to person, place, and time.  Psychiatric: He has a normal mood and affect. His behavior is normal.          Assessment & Plan:  1. Essential hypertension Bradley Torres is well controlled. Continue 3 drug regimen  2. Low back pain, unspecified back pain laterality, with sciatica presence unspecified As are controlled with Percocet 10 mg 1 or 2 a day  3. Morbid obesity, unspecified obesity type ([place]) Weight is stable. Encourage weight loss  Wardell Honour MD

## 2015-09-21 ENCOUNTER — Other Ambulatory Visit: Payer: Self-pay | Admitting: Family Medicine

## 2015-11-02 ENCOUNTER — Other Ambulatory Visit: Payer: Self-pay | Admitting: Family Medicine

## 2015-11-03 MED ORDER — OXYCODONE-ACETAMINOPHEN 10-325 MG PO TABS: ORAL_TABLET | ORAL | 0 refills | Status: DC

## 2015-11-03 NOTE — Telephone Encounter (Signed)
Last filled 08/21/15, last seen 07/22/15. Has appt 11/12/15.

## 2015-11-04 ENCOUNTER — Other Ambulatory Visit: Payer: Self-pay | Admitting: Family Medicine

## 2015-11-04 NOTE — Telephone Encounter (Signed)
Pt aware written Rx is at front desk ready for pickup  

## 2015-11-12 ENCOUNTER — Ambulatory Visit (INDEPENDENT_AMBULATORY_CARE_PROVIDER_SITE_OTHER): Payer: Medicaid Other | Admitting: Family Medicine

## 2015-11-12 ENCOUNTER — Encounter: Payer: Self-pay | Admitting: Family Medicine

## 2015-11-12 VITALS — BP 123/76 | HR 59 | Temp 97.0°F | Ht 71.0 in | Wt >= 6400 oz

## 2015-11-12 DIAGNOSIS — R609 Edema, unspecified: Secondary | ICD-10-CM | POA: Diagnosis not present

## 2015-11-12 DIAGNOSIS — I10 Essential (primary) hypertension: Secondary | ICD-10-CM | POA: Diagnosis not present

## 2015-11-12 MED ORDER — VALSARTAN-HYDROCHLOROTHIAZIDE 160-12.5 MG PO TABS: 1.0000 | ORAL_TABLET | Freq: Every day | ORAL | 0 refills | Status: DC

## 2015-11-12 MED ORDER — OXYCODONE-ACETAMINOPHEN 10-325 MG PO TABS: ORAL_TABLET | ORAL | 0 refills | Status: DC

## 2015-11-12 MED ORDER — TERBINAFINE HCL 250 MG PO TABS: 250.0000 mg | ORAL_TABLET | Freq: Every day | ORAL | 0 refills | Status: DC

## 2015-11-12 MED ORDER — FUROSEMIDE 20 MG PO TABS: 20.0000 mg | ORAL_TABLET | Freq: Once | ORAL | Status: DC

## 2015-11-12 MED ORDER — AMLODIPINE BESYLATE 5 MG PO TABS: 5.0000 mg | ORAL_TABLET | Freq: Every day | ORAL | 0 refills | Status: DC

## 2015-11-12 NOTE — Progress Notes (Signed)
   Subjective:    Patient ID: Bradley Torres, male    DOB: 06-Nov-1960, 55 y.o.   MRN: [id number]  HPI [name] is here  to follow-up his blood pressure and back pain blood pressure has been well controlled on 3 drug regimen including amlodipine and bisoprolol and valsartan. Back pain is tolerated. He is status post multiple surgeries on his back and takes oxycodone twice a day most days  Patient Active Problem List   Diagnosis Date Noted  . Essential hypertension 01/23/2014  . Back pain 01/23/2014  . Severe obesity (BMI >= 40) ([name]) 01/23/2014   Outpatient Encounter Prescriptions as of 11/12/2015  Medication Sig  . amLODipine (NORVASC) 5 MG tablet TAKE 1 TABLET BY MOUTH EVERY DAY  . bisoprolol-hydrochlorothiazide (ZIAC) 10-6.25 MG per tablet Take 1 tablet by mouth daily.  Marland Kitchen oxyCODONE-acetaminophen (PERCOCET) 10-325 MG tablet Take 1-2 tablets twice a day as needed  . oxyCODONE-acetaminophen (PERCOCET) 10-325 MG tablet Take 1-2 tablets twice a day as needed  . terbinafine (LAMISIL) 1 % cream Apply 1 application topically daily. Reported on 04/02/2015  . valsartan-hydrochlorothiazide (DIOVAN-HCT) 160-12.5 MG tablet TAKE 1 TABLET BY MOUTH EVERY DAY  . [DISCONTINUED] bisoprolol-hydrochlorothiazide (ZIAC) 10-6.25 MG tablet TAKE 1 TABLET BY MOUTH DAILY.  Marland Kitchen terbinafine (LAMISIL) 250 MG tablet Take 1 tablet (250 mg total) by mouth daily. (Patient not taking: Reported on 11/12/2015)   No facility-administered encounter medications on file as of 11/12/2015.       Review of Systems  Constitutional: Negative.   Respiratory: Negative.   Cardiovascular: Negative.   Musculoskeletal: Positive for back pain.  Neurological: Negative.        Objective:   Physical Exam  Constitutional: He is oriented to person, place, and time. He appears well-nourished.  Obese  Cardiovascular: Normal rate, regular rhythm and normal heart sounds.   Pulmonary/Chest: Effort normal and breath sounds normal.    Musculoskeletal: He exhibits edema.  Neurological: He is alert and oriented to person, place, and time.  Psychiatric: He has a normal mood and affect. His behavior is normal.   BP 123/76 (BP Location: Right Arm, Patient Position: Sitting, Cuff Size: Large)   Pulse (!) 59   Temp 97 F (36.1 C) (Oral)   Ht _0  (1.803 m)   Wt (!) 409 lb 3.2 oz (185.6 kg)   BMI 57.07 kg/m         Assessment & Plan:  1. Edema, unspecified type Patient sits with his feet down on the floor watching TV most of the day. There is very little ambulation or walking. I would like to give him a diuretic to take once or twice a week as needed - furosemide (LASIX) tablet 20 mg; Take 1 tablet (20 mg total) by mouth once. - CMP14+EGFR  2. Essential hypertension A pressures well controlled on current regimen will continue same - Lipid panel  Wardell Honour MD

## 2015-11-13 LAB — CMP14+EGFR
A/G RATIO: 1.4 (ref 1.2–2.2)
ALT: 29 IU/L (ref 0–44)
AST: 24 IU/L (ref 0–40)
Albumin: 4.1 g/dL (ref 3.5–5.5)
Alkaline Phosphatase: 58 IU/L (ref 39–117)
BILIRUBIN TOTAL: 0.3 mg/dL (ref 0.0–1.2)
BUN / CREAT RATIO: 15 (ref 9–20)
BUN: 21 mg/dL (ref 6–24)
CHLORIDE: 97 mmol/L (ref 96–106)
CO2: 25 mmol/L (ref 18–29)
Calcium: 9.6 mg/dL (ref 8.7–10.2)
Creatinine, Ser: 1.37 mg/dL — ABNORMAL HIGH (ref 0.76–1.27)
GFR, EST AFRICAN AMERICAN: 68 mL/min/{1.73_m2} (ref >59)
GFR, EST NON AFRICAN AMERICAN: 58 mL/min/{1.73_m2} — AB (ref >59)
GLOBULIN, TOTAL: 3 g/dL (ref 1.5–4.5)
Glucose: 130 mg/dL — ABNORMAL HIGH (ref 65–99)
POTASSIUM: 4.5 mmol/L (ref 3.5–5.2)
SODIUM: 142 mmol/L (ref 134–144)
TOTAL PROTEIN: 7.1 g/dL (ref 6.0–8.5)

## 2015-11-13 LAB — LIPID PANEL
CHOL/HDL RATIO: 4.5 ratio (ref 0.0–5.0)
Cholesterol, Total: 187 mg/dL (ref 100–199)
HDL: 42 mg/dL (ref >39)
LDL Calculated: 123 mg/dL — ABNORMAL HIGH (ref 0–99)
Triglycerides: 112 mg/dL (ref 0–149)
VLDL Cholesterol Cal: 22 mg/dL (ref 5–40)

## 2016-02-11 ENCOUNTER — Other Ambulatory Visit: Payer: Self-pay | Admitting: Family Medicine

## 2016-03-09 ENCOUNTER — Other Ambulatory Visit: Payer: Self-pay | Admitting: Family Medicine

## 2016-03-17 ENCOUNTER — Encounter: Payer: Self-pay | Admitting: Family Medicine

## 2016-03-17 ENCOUNTER — Ambulatory Visit (INDEPENDENT_AMBULATORY_CARE_PROVIDER_SITE_OTHER): Payer: Medicaid Other | Admitting: Family Medicine

## 2016-03-17 VITALS — BP 124/73 | HR 64 | Temp 97.8°F | Ht 71.0 in | Wt >= 6400 oz

## 2016-03-17 DIAGNOSIS — M545 Low back pain: Secondary | ICD-10-CM | POA: Diagnosis not present

## 2016-03-17 DIAGNOSIS — R52 Pain, unspecified: Secondary | ICD-10-CM

## 2016-03-17 DIAGNOSIS — G8929 Other chronic pain: Secondary | ICD-10-CM | POA: Diagnosis not present

## 2016-03-17 DIAGNOSIS — I10 Essential (primary) hypertension: Secondary | ICD-10-CM

## 2016-03-17 MED ORDER — OXYCODONE-ACETAMINOPHEN 10-325 MG PO TABS: ORAL_TABLET | ORAL | 0 refills | Status: DC

## 2016-03-17 NOTE — Progress Notes (Signed)
   Subjective:    Patient ID: Bradley Torres, male    DOB: Jul 29, 1960, 55 y.o.   MRN: [id number]  HPI Pt here for follow up and management of chronic medical problems which includes hypertension and pain management. Symptoms of chronic pain persisted but are controlled fairly well with oxycodone. He takes about 1-2 tablets a day. Blood pressures have been well controlled on current regimen which includes bisoprolol, amlodipine, and valsartan with hydrochlorothiazide    Patient Active Problem List   Diagnosis Date Noted  . Essential hypertension 01/23/2014  . Back pain 01/23/2014  . Severe obesity (BMI >= 40) ([place]) 01/23/2014   Outpatient Encounter Prescriptions as of 03/17/2016  Medication Sig  . amLODipine (NORVASC) 5 MG tablet TAKE 1 TABLET BY MOUTH DAILY.  . bisoprolol-hydrochlorothiazide (ZIAC) 10-6.25 MG per tablet Take 1 tablet by mouth daily.  Marland Kitchen oxyCODONE-acetaminophen (PERCOCET) 10-325 MG tablet Take 1-2 tablets twice a day as needed  . valsartan-hydrochlorothiazide (DIOVAN-HCT) 160-12.5 MG tablet Take 1 tablet by mouth daily.  Marland Kitchen oxyCODONE-acetaminophen (PERCOCET) 10-325 MG tablet Take 1-2 tablets twice a day as needed (Patient not taking: Reported on 03/17/2016)  . [DISCONTINUED] bisoprolol-hydrochlorothiazide (ZIAC) 10-6.25 MG tablet TAKE 1 TABLET BY MOUTH DAILY.  . [DISCONTINUED] terbinafine (LAMISIL) 1 % cream Apply 1 application topically daily. Reported on 04/02/2015  . [DISCONTINUED] terbinafine (LAMISIL) 250 MG tablet Take 1 tablet (250 mg total) by mouth daily.  . [DISCONTINUED] furosemide (LASIX) tablet 20 mg    No facility-administered encounter medications on file as of 03/17/2016.       Review of Systems  Constitutional: Negative.   HENT: Negative.   Eyes: Negative.   Respiratory: Negative.   Cardiovascular: Negative.   Gastrointestinal: Negative.   Endocrine: Negative.   Genitourinary: Negative.   Musculoskeletal: Positive for arthralgias (bilateral leg and  left hip pain) and back pain.  Skin: Negative.   Allergic/Immunologic: Negative.   Neurological: Negative.   Hematological: Negative.   Psychiatric/Behavioral: Negative.        Objective:   Physical Exam  Constitutional: He is oriented to person, place, and time.  Morbid obesity  Cardiovascular: Normal rate, regular rhythm and normal heart sounds.   Pulmonary/Chest: Effort normal and breath sounds normal.  Neurological: He is alert and oriented to person, place, and time.  Psychiatric: He has a normal mood and affect. His behavior is normal.   BP 124/73 (BP Location: Left Arm)   Pulse 64   Temp 97.8 F (36.6 C) (Oral)   Ht 5\' 11"  (1.803 m)   Wt (!) 403 lb (182.8 kg)   SpO2 94%   BMI 56.21 kg/m         Assessment & Plan:  1. Essential hypertension A pressures well controlled on current regimen. Continue same  2. Pain management Pain is controlled. I think a lot of her pain is much of function of his obesity and stress on joints  3. Chronic low back pain, unspecified back pain laterality, with sciatica presence unspecified   Wardell Honour MD

## 2016-03-17 NOTE — Patient Instructions (Signed)
Continue current medications. Continue good therapeutic lifestyle changes which include good diet and exercise. Fall precautions discussed with patient. If an FOBT was given today- please return it to our front desk. If you are over 54 years old - you may need Prevnar 13 or the adult Pneumonia vaccine.  **Flu shots are available--- please call and schedule a FLU-CLINIC appointment**  After your visit with us today you will receive a survey in the mail or online from [employer] regarding your care with us. Please take a moment to fill this out. Your feedback is very important to us as you can help us better understand your patient needs as well as improve your experience and satisfaction. WE CARE ABOUT YOU!!!    

## 2016-05-09 ENCOUNTER — Other Ambulatory Visit: Payer: Self-pay | Admitting: Family Medicine

## 2016-06-07 ENCOUNTER — Other Ambulatory Visit: Payer: Self-pay | Admitting: Family Medicine

## 2016-06-14 NOTE — Progress Notes (Signed)
   Subjective:    Patient ID: Bradley Torres, male    DOB: [birth year], 56 y.o.   MRN: [id number]  HPI 56 year old gentleman is followed for hypertension primarily he has had elevated fasting blood sugar and his allegedly restricting carbohydrates, but he has not lost any weight. He also has chronic back pain and takes oxycodone twice a day. He has always complied with the medicines as prescribed.  Patient Active Problem List   Diagnosis Date Noted  . Essential hypertension 01/23/2014  . Back pain 01/23/2014  . Severe obesity (BMI >= 40) ([place]) 01/23/2014   Outpatient Encounter Prescriptions as of 06/15/2016  Medication Sig  . amLODipine (NORVASC) 5 MG tablet TAKE 1 TABLET BY MOUTH DAILY.  . bisoprolol-hydrochlorothiazide (ZIAC) 10-6.25 MG per tablet Take 1 tablet by mouth daily.  Marland Kitchen oxyCODONE-acetaminophen (PERCOCET) 10-325 MG tablet Take 1-2 tablets twice a day as needed  . oxyCODONE-acetaminophen (PERCOCET) 10-325 MG tablet Take 1-2 tablets twice a day as needed  . valsartan-hydrochlorothiazide (DIOVAN-HCT) 160-12.5 MG tablet TAKE 1 TABLET BY MOUTH DAILY.  . [DISCONTINUED] bisoprolol-hydrochlorothiazide (ZIAC) 10-6.25 MG tablet TAKE 1 TABLET BY MOUTH EVERY DAY   No facility-administered encounter medications on file as of 06/15/2016.       Review of Systems  Constitutional: Negative.   Respiratory: Negative.   Musculoskeletal: Positive for back pain.  Psychiatric/Behavioral: Negative.        Objective:   Physical Exam  Constitutional: He is oriented to person, place, and time. He appears well-developed and well-nourished.  Cardiovascular: Normal rate, regular rhythm and normal heart sounds.   Pulmonary/Chest: Effort normal and breath sounds normal.  Neurological: He is alert and oriented to person, place, and time.  Psychiatric: He has a normal mood and affect. His behavior is normal.   BP 129/85   Pulse 65   Temp 97.3 F (36.3 C) (Oral)   Ht 5\' 11"  (1.803 m)   Wt (!) 407  lb 12.8 oz (185 kg)   BMI 56.88 kg/m         Assessment & Plan:  1. Essential hypertension Blood pressures are fairly well controlled at this point on a triple drug regimen  2. Elevated glucose Hemoglobin A1c is elevated at 6.1 consistent with mild diabetes. We'll continue to treat with carb restriction but will likely need medicine in the future - [facility] Hb A1c Waived Wardell Honour MD

## 2016-06-15 ENCOUNTER — Encounter: Payer: Self-pay | Admitting: Family Medicine

## 2016-06-15 ENCOUNTER — Ambulatory Visit (INDEPENDENT_AMBULATORY_CARE_PROVIDER_SITE_OTHER): Payer: Medicaid Other | Admitting: Family Medicine

## 2016-06-15 VITALS — BP 129/85 | HR 65 | Temp 97.3°F | Ht 71.0 in | Wt >= 6400 oz

## 2016-06-15 DIAGNOSIS — R7309 Other abnormal glucose: Secondary | ICD-10-CM

## 2016-06-15 DIAGNOSIS — I10 Essential (primary) hypertension: Secondary | ICD-10-CM | POA: Diagnosis not present

## 2016-06-15 LAB — BAYER DCA HB A1C WAIVED: HB A1C (BAYER DCA - WAIVED): 6.1 % (ref <7.0)

## 2016-06-15 MED ORDER — AMLODIPINE BESYLATE 5 MG PO TABS: 5.0000 mg | ORAL_TABLET | Freq: Every day | ORAL | 1 refills | Status: DC

## 2016-06-15 MED ORDER — OXYCODONE-ACETAMINOPHEN 10-325 MG PO TABS: ORAL_TABLET | ORAL | 0 refills | Status: DC

## 2016-08-15 ENCOUNTER — Ambulatory Visit (INDEPENDENT_AMBULATORY_CARE_PROVIDER_SITE_OTHER): Payer: Medicaid Other | Admitting: Physician Assistant

## 2016-08-15 ENCOUNTER — Encounter: Payer: Self-pay | Admitting: Physician Assistant

## 2016-08-15 VITALS — BP 155/85 | HR 67 | Temp 97.9°F | Ht 71.0 in | Wt >= 6400 oz

## 2016-08-15 DIAGNOSIS — M545 Low back pain: Secondary | ICD-10-CM | POA: Diagnosis not present

## 2016-08-15 DIAGNOSIS — G8929 Other chronic pain: Secondary | ICD-10-CM

## 2016-08-15 DIAGNOSIS — I10 Essential (primary) hypertension: Secondary | ICD-10-CM | POA: Diagnosis not present

## 2016-08-15 MED ORDER — AMLODIPINE BESYLATE 5 MG PO TABS: 5.0000 mg | ORAL_TABLET | Freq: Every day | ORAL | 1 refills | Status: DC

## 2016-08-15 MED ORDER — OXYCODONE-ACETAMINOPHEN 10-325 MG PO TABS: ORAL_TABLET | ORAL | 0 refills | Status: DC

## 2016-08-15 MED ORDER — OXYCODONE-ACETAMINOPHEN 10-325 MG PO TABS: 1.0000 | ORAL_TABLET | Freq: Two times a day (BID) | ORAL | 0 refills | Status: DC

## 2016-08-15 MED ORDER — VALSARTAN-HYDROCHLOROTHIAZIDE 160-12.5 MG PO TABS: 1.0000 | ORAL_TABLET | Freq: Every day | ORAL | 1 refills | Status: DC

## 2016-08-15 MED ORDER — BISOPROLOL-HYDROCHLOROTHIAZIDE 10-6.25 MG PO TABS: 1.0000 | ORAL_TABLET | Freq: Every day | ORAL | 1 refills | Status: DC

## 2016-08-15 NOTE — Progress Notes (Signed)
BP (!) 155/85   Pulse 67   Temp 97.9 F (36.6 C) (Oral)   Ht 5\' 11"  (1.803 m)   Wt (!) 410 lb (186 kg)   BMI 57.18 kg/m    Subjective:    Patient ID: Bradley Torres, male    DOB: 09/02/1960, 56 y.o.   MRN: [id number]  HPI: Bradley Torres is a 56 y.o. male presenting on 08/15/2016 for Hypertension (2 mos rck )  This patient comes in for periodic recheck on medications and conditions including Hypertension and chronic low back pain. Patient is very upset today. He just lost his uncle about 2 hours ago. He did go to the home where he was deceased. He was found to have lung cancer that was very severe about 1 week ago. Visibly upset and blood pressure quite elevated. He states that he'll continue taking his medications and recheck his blood pressure in the next week. Otherwise he was stable with his medication not having any difficulties. He only takes 2 oxycodone per day. I commended them on this..   All medications are reviewed today. There are no reports of any problems with the medications. All of the medical conditions are reviewed and updated.  Lab work is reviewed and will be ordered as medically necessary. There are no new problems reported with today's visit.   Relevant past medical, surgical, family and social history reviewed and updated as indicated. Allergies and medications reviewed and updated.  Past Medical History:  Diagnosis Date  . Chronic back pain   . Essential hypertension   . Morbid obesity ([place])   . Panic disorder   . Prediabetes   . Shoulder pain, left    Following fall    Past Surgical History:  Procedure Laterality Date  . Ankle fracture sugery Left   . LUMBAR SPINE SURGERY      Review of Systems  Constitutional: Negative.  Negative for appetite change and fatigue.  HENT: Negative.   Eyes: Negative.  Negative for pain and visual disturbance.  Respiratory: Negative.  Negative for cough, chest tightness, shortness of breath and wheezing.     Cardiovascular: Negative.  Negative for chest pain, palpitations and leg swelling.  Gastrointestinal: Negative.  Negative for abdominal pain, diarrhea, nausea and vomiting.  Endocrine: Negative.   Genitourinary: Negative.   Musculoskeletal: Positive for back pain.  Skin: Negative.  Negative for color change and rash.  Neurological: Negative.  Negative for weakness, numbness and headaches.  Psychiatric/Behavioral: The patient is nervous/anxious.     Allergies as of 08/15/2016      Reactions   Ibuprofen    Feels that it caused rectal bleeding   Losartan    rash      Medication List       Accurate as of 08/15/16  5:00 PM. Always use your most recent med list.          amLODipine 5 MG tablet Commonly known as:  NORVASC Take 1 tablet (5 mg total) by mouth daily.   bisoprolol-hydrochlorothiazide 10-6.25 MG tablet Commonly known as:  ZIAC Take 1 tablet by mouth daily.   oxyCODONE-acetaminophen 10-325 MG tablet Commonly known as:  PERCOCET Take 1-2 tablets twice a day as needed   oxyCODONE-acetaminophen 10-325 MG tablet Commonly known as:  PERCOCET Take 1-2 tablets twice a day as needed   oxyCODONE-acetaminophen 10-325 MG tablet Commonly known as:  PERCOCET Take 1 tablet by mouth 2 (two) times daily.   valsartan-hydrochlorothiazide 160-12.5 MG tablet Commonly known  as:  DIOVAN-HCT Take 1 tablet by mouth daily.          Objective:    BP (!) 155/85   Pulse 67   Temp 97.9 F (36.6 C) (Oral)   Ht 5\' 11"  (1.803 m)   Wt (!) 410 lb (186 kg)   BMI 57.18 kg/m   Allergies  Allergen Reactions  . Ibuprofen     Feels that it caused rectal bleeding  . Losartan     rash    Physical Exam  Constitutional: He appears well-developed and well-nourished. No distress.  HENT:  Head: Normocephalic and atraumatic.  Eyes: Conjunctivae and EOM are normal. Pupils are equal, round, and reactive to light.  Cardiovascular: Normal rate, regular rhythm and normal heart sounds.    Pulmonary/Chest: Effort normal and breath sounds normal. No respiratory distress.  Skin: Skin is warm and dry.  Psychiatric: He has a normal mood and affect. His behavior is normal.  Nursing note and vitals reviewed.   Results for orders placed or performed in visit on 06/15/16  Bayer DCA Hb A1c Waived  Result Value Ref Range   Bayer DCA Hb A1c Waived 6.1 <7.0 %      Assessment & Plan:   1. Essential hypertension - valsartan-hydrochlorothiazide (DIOVAN-HCT) 160-12.5 MG tablet; Take 1 tablet by mouth daily.  Dispense: 90 tablet; Refill: 1 - amLODipine (NORVASC) 5 MG tablet; Take 1 tablet (5 mg total) by mouth daily.  Dispense: 90 tablet; Refill: 1 - bisoprolol-hydrochlorothiazide (ZIAC) 10-6.25 MG tablet; Take 1 tablet by mouth daily.  Dispense: 90 tablet; Refill: 1  2. Severe obesity (BMI >= 40) ([facility])  3. Chronic low back pain, unspecified back pain laterality, with sciatica presence unspecified - oxyCODONE-acetaminophen (PERCOCET) 10-325 MG tablet; Take 1-2 tablets twice a day as needed  Dispense: 60 tablet; Refill: 0 - oxyCODONE-acetaminophen (PERCOCET) 10-325 MG tablet; Take 1-2 tablets twice a day as needed  Dispense: 60 tablet; Refill: 0 - oxyCODONE-acetaminophen (PERCOCET) 10-325 MG tablet; Take 1 tablet by mouth 2 (two) times daily.  Dispense: 60 tablet; Refill: 0   Continue all other maintenance medications as listed above.  Follow up plan: Return in about 3 months (around 11/15/2016) for recheck.  Educational handout given for [place] PA-C [place] [address]  [place], [place] [address] [phone]   08/15/2016, 5:00 PM

## 2016-08-15 NOTE — Patient Instructions (Signed)
dash DASH Eating Plan DASH stands for "Dietary Approaches to Stop Hypertension." The DASH eating plan is a healthy eating plan that has been shown to reduce high blood pressure (hypertension). It may also reduce your risk for type 2 diabetes, heart disease, and stroke. The DASH eating plan may also help with weight loss. What are tips for following this plan? General guidelines   Avoid eating more than 2,300 mg (milligrams) of salt (sodium) a day. If you have hypertension, you may need to reduce your sodium intake to 1,500 mg a day.  Limit alcohol intake to no more than 1 drink a day for nonpregnant women and 2 drinks a day for men. One drink equals 12 oz of beer, 5 oz of wine, or 1 oz of hard liquor.  Work with your health care provider to maintain a healthy body weight or to lose weight. Ask what an ideal weight is for you.  Get at least 30 minutes of exercise that causes your heart to beat faster (aerobic exercise) most days of the week. Activities may include walking, swimming, or biking.  Work with your health care provider or diet and nutrition specialist (dietitian) to adjust your eating plan to your individual calorie needs. Reading food labels   Check food labels for the amount of sodium per serving. Choose foods with less than 5 percent of the Daily Value of sodium. Generally, foods with less than 300 mg of sodium per serving fit into this eating plan.  To find whole grains, look for the word "whole" as the first word in the ingredient list. Shopping   Buy products labeled as "low-sodium" or "no salt added."  Buy fresh foods. Avoid canned foods and premade or frozen meals. Cooking   Avoid adding salt when cooking. Use salt-free seasonings or herbs instead of table salt or sea salt. Check with your health care provider or pharmacist before using salt substitutes.  Do not fry foods. Cook foods using healthy methods such as baking, boiling, grilling, and broiling instead.  Cook  with heart-healthy oils, such as olive, canola, soybean, or sunflower oil. Meal planning    Eat a balanced diet that includes:  5 or more servings of fruits and vegetables each day. At each meal, try to fill half of your plate with fruits and vegetables.  Up to 6-8 servings of whole grains each day.  Less than 6 oz of lean meat, poultry, or fish each day. A 3-oz serving of meat is about the same size as a deck of cards. One egg equals 1 oz.  2 servings of low-fat dairy each day.  A serving of nuts, seeds, or beans 5 times each week.  Heart-healthy fats. Healthy fats called Omega-3 fatty acids are found in foods such as flaxseeds and coldwater fish, like sardines, salmon, and mackerel.  Limit how much you eat of the following:  Canned or prepackaged foods.  Food that is high in trans fat, such as fried foods.  Food that is high in saturated fat, such as fatty meat.  Sweets, desserts, sugary drinks, and other foods with added sugar.  Full-fat dairy products.  Do not salt foods before eating.  Try to eat at least 2 vegetarian meals each week.  Eat more home-cooked food and less restaurant, buffet, and fast food.  When eating at a restaurant, ask that your food be prepared with less salt or no salt, if possible. What foods are recommended? The items listed may not be a complete list.  Talk with your dietitian about what dietary choices are best for you. Grains  Whole-grain or whole-wheat bread. Whole-grain or whole-wheat pasta. Brown rice. [place]. Bulgur. Whole-grain and low-sodium cereals. Pita bread. Low-fat, low-sodium crackers. Whole-wheat flour tortillas. Vegetables  Fresh or frozen vegetables (raw, steamed, roasted, or grilled). Low-sodium or reduced-sodium tomato and vegetable juice. Low-sodium or reduced-sodium tomato sauce and tomato paste. Low-sodium or reduced-sodium canned vegetables. Fruits  All fresh, dried, or frozen fruit. Canned fruit in natural juice  (without added sugar). Meat and other protein foods  Skinless chicken or [place]. Ground chicken or [place]. Pork with fat trimmed off. Fish and seafood. Egg whites. Dried beans, peas, or lentils. Unsalted nuts, nut butters, and seeds. Unsalted canned beans. Lean cuts of beef with fat trimmed off. Low-sodium, lean deli meat. Dairy  Low-fat (1%) or fat-free (skim) milk. Fat-free, low-fat, or reduced-fat cheeses. Nonfat, low-sodium ricotta or cottage cheese. Low-fat or nonfat yogurt. Low-fat, low-sodium cheese. Fats and oils  Soft margarine without trans fats. Vegetable oil. Low-fat, reduced-fat, or light mayonnaise and salad dressings (reduced-sodium). Canola, safflower, olive, soybean, and sunflower oils. Avocado. Seasoning and other foods  Herbs. Spices. Seasoning mixes without salt. Unsalted popcorn and pretzels. Fat-free sweets. What foods are not recommended? The items listed may not be a complete list. Talk with your dietitian about what dietary choices are best for you. Grains  Baked goods made with fat, such as croissants, muffins, or some breads. Dry pasta or rice meal packs. Vegetables  Creamed or fried vegetables. Vegetables in a cheese sauce. Regular canned vegetables (not low-sodium or reduced-sodium). Regular canned tomato sauce and paste (not low-sodium or reduced-sodium). Regular tomato and vegetable juice (not low-sodium or reduced-sodium). Angie Fava. Olives. Fruits  Canned fruit in a light or heavy syrup. Fried fruit. Fruit in cream or butter sauce. Meat and other protein foods  Fatty cuts of meat. Ribs. Fried meat. Berniece Salines. Sausage. Bologna and other processed lunch meats. Salami. Fatback. Hotdogs. Bratwurst. Salted nuts and seeds. Canned beans with added salt. Canned or smoked fish. Whole eggs or egg yolks. Chicken or [place] with skin. Dairy  Whole or 2% milk, cream, and half-and-half. Whole or full-fat cream cheese. Whole-fat or sweetened yogurt. Full-fat cheese. Nondairy creamers.  Whipped toppings. Processed cheese and cheese spreads. Fats and oils  Butter. Stick margarine. Lard. Shortening. Ghee. Bacon fat. Tropical oils, such as coconut, palm kernel, or palm oil. Seasoning and other foods  Salted popcorn and pretzels. Onion salt, garlic salt, seasoned salt, table salt, and sea salt. Worcestershire sauce. Tartar sauce. Barbecue sauce. Teriyaki sauce. Soy sauce, including reduced-sodium. Steak sauce. Canned and packaged gravies. Fish sauce. Oyster sauce. Cocktail sauce. Horseradish that you find on the shelf. Ketchup. Mustard. Meat flavorings and tenderizers. Bouillon cubes. Hot sauce and Tabasco sauce. Premade or packaged marinades. Premade or packaged taco seasonings. Relishes. Regular salad dressings. Where to find more information:  National Heart, Lung, and [place]: https://wilson-eaton.com/  American Heart Association: www.heart.org Summary  The DASH eating plan is a healthy eating plan that has been shown to reduce high blood pressure (hypertension). It may also reduce your risk for type 2 diabetes, heart disease, and stroke.  With the DASH eating plan, you should limit salt (sodium) intake to 2,300 mg a day. If you have hypertension, you may need to reduce your sodium intake to 1,500 mg a day.  When on the DASH eating plan, aim to eat more fresh fruits and vegetables, whole grains, lean proteins, low-fat dairy, and heart-healthy fats.  Work with your health care provider or diet and nutrition specialist (dietitian) to adjust your eating plan to your individual calorie needs. This information is not intended to replace advice given to you by your health care provider. Make sure you discuss any questions you have with your health care provider. Document Released: 03/03/2011 Document Revised: 03/07/2016 Document Reviewed: 03/07/2016 Elsevier Interactive Patient Education  2017 [employer].

## 2016-10-17 ENCOUNTER — Other Ambulatory Visit: Payer: Self-pay | Admitting: Physician Assistant

## 2016-10-17 ENCOUNTER — Telehealth: Payer: Self-pay

## 2016-10-17 MED ORDER — OLMESARTAN MEDOXOMIL-HCTZ 20-12.5 MG PO TABS: 1.0000 | ORAL_TABLET | Freq: Every day | ORAL | 2 refills | Status: DC

## 2016-10-17 NOTE — Telephone Encounter (Signed)
Patient is aware of change in blood pressure medication.  He will start new medication and monitor blood pressure.  Will call us if abnormal.

## 2016-10-18 ENCOUNTER — Telehealth: Payer: Self-pay

## 2016-10-18 NOTE — Telephone Encounter (Signed)
Allergic to losartan and valsartan is recalled.  Will need a PA I guess

## 2016-10-18 NOTE — Telephone Encounter (Signed)
Medicaid non preferred Olmesartan Medoxomil HCTZ   Preferred is Losartan HCTZ and Valsartan HCTZ

## 2016-10-19 NOTE — Telephone Encounter (Signed)
I will do a PA

## 2016-10-27 ENCOUNTER — Telehealth: Payer: Self-pay | Admitting: Physician Assistant

## 2016-10-27 NOTE — Telephone Encounter (Signed)
Per pt, new BP med is making pt extremely fatigued with excessive sleepiness Can not tolerate side effects Please change RX

## 2016-10-30 NOTE — Telephone Encounter (Signed)
Is his blood pressure going too low? If so he could cut the medication in half. If it is not too low I will switch med.

## 2016-10-31 NOTE — Telephone Encounter (Signed)
LM 8/6-jhb --- we need BP readings

## 2016-11-08 ENCOUNTER — Telehealth: Payer: Self-pay

## 2016-11-08 NOTE — Telephone Encounter (Signed)
Detailed message left for patient to please call us back in regards to his blood pressure.

## 2016-11-08 NOTE — Telephone Encounter (Signed)
Patient reports he has not been checking his BP because he does not have a meter at home.  He normally checks at the drug store but has not been there lately.  He has stopped the Benicar and is only taking the Amlodipine and Ziac at this time.  He has another appointment with you next Tuesday, 8/21, and will check BP between now and then and bring results to you at that appointment.

## 2016-11-08 NOTE — Telephone Encounter (Signed)
Noted, he was having side effects and was concerned it was going low.  Get a reading if you can, and see you on Tuesday.

## 2016-11-15 ENCOUNTER — Encounter: Payer: Self-pay | Admitting: Physician Assistant

## 2016-11-15 ENCOUNTER — Ambulatory Visit (INDEPENDENT_AMBULATORY_CARE_PROVIDER_SITE_OTHER): Payer: Medicaid Other | Admitting: Physician Assistant

## 2016-11-15 VITALS — BP 144/83 | HR 60 | Temp 98.5°F | Ht 71.0 in | Wt >= 6400 oz

## 2016-11-15 DIAGNOSIS — M545 Low back pain: Secondary | ICD-10-CM

## 2016-11-15 DIAGNOSIS — G8929 Other chronic pain: Secondary | ICD-10-CM

## 2016-11-15 DIAGNOSIS — I10 Essential (primary) hypertension: Secondary | ICD-10-CM | POA: Diagnosis not present

## 2016-11-15 DIAGNOSIS — Z0000 Encounter for general adult medical examination without abnormal findings: Secondary | ICD-10-CM

## 2016-11-15 DIAGNOSIS — R7303 Prediabetes: Secondary | ICD-10-CM | POA: Insufficient documentation

## 2016-11-15 LAB — BAYER DCA HB A1C WAIVED: HB A1C (BAYER DCA - WAIVED): 5.6 % (ref <7.0)

## 2016-11-15 MED ORDER — OXYCODONE-ACETAMINOPHEN 10-325 MG PO TABS: ORAL_TABLET | ORAL | 0 refills | Status: DC

## 2016-11-15 MED ORDER — CYCLOBENZAPRINE HCL 10 MG PO TABS: 10.0000 mg | ORAL_TABLET | Freq: Three times a day (TID) | ORAL | 1 refills | Status: DC | PRN

## 2016-11-15 MED ORDER — LISINOPRIL-HYDROCHLOROTHIAZIDE 20-25 MG PO TABS: 1.0000 | ORAL_TABLET | Freq: Every day | ORAL | 3 refills | Status: DC

## 2016-11-15 MED ORDER — METHYLPREDNISOLONE ACETATE 80 MG/ML IJ SUSP
80.0000 mg | Freq: Once | INTRAMUSCULAR | Status: AC
  Administered 2016-11-15: 80 mg via INTRAMUSCULAR

## 2016-11-15 MED ORDER — OXYCODONE-ACETAMINOPHEN 10-325 MG PO TABS: 1.0000 | ORAL_TABLET | Freq: Two times a day (BID) | ORAL | 0 refills | Status: DC

## 2016-11-15 NOTE — Patient Instructions (Signed)
In a few days you may receive a survey in the mail or online from [employer] regarding your visit with us today. Please take a moment to fill this out. Your feedback is very important to our whole office. It can help us better understand your needs as well as improve your experience and satisfaction. Thank you for taking your time to complete it. We care about you.  Eiley Mcginnity, PA-C  

## 2016-11-15 NOTE — Progress Notes (Signed)
BP (!) 144/83   Pulse 60   Temp 98.5 F (36.9 C) (Oral)   Ht _0  (1.803 m)   Wt (!) 418 lb 6.4 oz (189.8 kg)   BMI 58.35 kg/m    Subjective:    Patient ID: Bradley Torres, male    DOB: 09-May-1960, 56 y.o.   MRN: [id number]  HPI: Bradley Torres is a 56 y.o. male presenting on 11/15/2016 for Follow-up (3 month ) and Hypertension  This patient comes in for periodic recheck on medications and conditions including Hypertension, chronic low back pain from degenerative disc disease, history of prediabetes. He has had difficulty with his hypertension. He had to stop the valsartan and due to the recall. He vented losartan and had an allergic reaction to it. He has never taken lisinopril in the past. He is on amlodipine 5 mg daily. He does experience edema in his lower legs. His back had a significant flareup in the past week. He has had significant pain with any position. It is worse when he changes positions. He does not have a muscle relaxant. His old Psychologist, sport and exercise was Dr. Cyndy Torres. Chronic pain is under control, patient has no new complaints. Medications are keeping things stable. Needs refills for the next three months.  [place] Controlled Substance website checked and normal. Drug screen performed today  . All medications are reviewed today. There are no reports of any problems with the medications. All of the medical conditions are reviewed and updated.  Lab work is reviewed and will be ordered as medically necessary. There are no new problems reported with today's visit.   Relevant past medical, surgical, family and social history reviewed and updated as indicated. Allergies and medications reviewed and updated.  Past Medical History:  Diagnosis Date  . Chronic back pain   . Essential hypertension   . Morbid obesity ([place])   . Panic disorder   . Prediabetes   . Shoulder pain, left    Following fall    Past Surgical History:  Procedure Laterality Date  . Ankle fracture sugery Left   .  LUMBAR SPINE SURGERY      Review of Systems  Constitutional: Negative.  Negative for appetite change and fatigue.  Eyes: Negative for pain and visual disturbance.  Respiratory: Negative.  Negative for cough, chest tightness, shortness of breath and wheezing.   Cardiovascular: Negative.  Negative for chest pain, palpitations and leg swelling.  Gastrointestinal: Negative.  Negative for abdominal pain, diarrhea, nausea and vomiting.  Genitourinary: Negative.   Musculoskeletal: Positive for arthralgias, back pain and myalgias.  Skin: Negative.  Negative for color change and rash.  Neurological: Negative.  Negative for weakness, numbness and headaches.  Psychiatric/Behavioral: Negative.     Allergies as of 11/15/2016      Reactions   Ibuprofen    Feels that it caused rectal bleeding   Losartan    rash      Medication List       Accurate as of 11/15/16  3:30 PM. Always use your most recent med list.          bisoprolol-hydrochlorothiazide 10-6.25 MG tablet Commonly known as:  ZIAC Take 1 tablet by mouth daily.   cyclobenzaprine 10 MG tablet Commonly known as:  FLEXERIL Take 1 tablet (10 mg total) by mouth 3 (three) times daily as needed for muscle spasms.   lisinopril-hydrochlorothiazide 20-25 MG tablet Commonly known as:  PRINZIDE,ZESTORETIC Take 1 tablet by mouth daily.   oxyCODONE-acetaminophen 10-325 MG tablet  Commonly known as:  PERCOCET Take 1-2 tablets twice a day as needed   oxyCODONE-acetaminophen 10-325 MG tablet Commonly known as:  PERCOCET Take 1-2 tablets twice a day as needed   oxyCODONE-acetaminophen 10-325 MG tablet Commonly known as:  PERCOCET Take 1 tablet by mouth 2 (two) times daily.          Objective:    BP (!) 144/83   Pulse 60   Temp 98.5 F (36.9 C) (Oral)   Ht _0  (1.803 m)   Wt (!) 418 lb 6.4 oz (189.8 kg)   BMI 58.35 kg/m   Allergies  Allergen Reactions  . Ibuprofen     Feels that it caused rectal bleeding  . Losartan       rash    Physical Exam  Constitutional: He appears well-developed and well-nourished. No distress.  HENT:  Head: Normocephalic and atraumatic.  Eyes: Pupils are equal, round, and reactive to light. Conjunctivae and EOM are normal.  Cardiovascular: Normal rate, regular rhythm and normal heart sounds.   Pulmonary/Chest: Effort normal and breath sounds normal. No respiratory distress.  Musculoskeletal:       Lumbar back: He exhibits decreased range of motion, tenderness, pain and spasm.  Neurological:  Reflex Scores:      Patellar reflexes are 1+ on the right side and 1+ on the left side. Skin: Skin is warm and dry.  Psychiatric: He has a normal mood and affect. His behavior is normal.  Nursing note and vitals reviewed.   Results for orders placed or performed in visit on 11/15/16  Bayer DCA Hb A1c Waived  Result Value Ref Range   Bayer DCA Hb A1c Waived 5.6 <7.0 %      Assessment & Plan:   1. Essential hypertension - CBC with Differential/Platelet - CMP14+EGFR - Lipid panel - Bayer DCA Hb A1c Waived - lisinopril-hydrochlorothiazide (PRINZIDE,ZESTORETIC) 20-25 MG tablet; Take 1 tablet by mouth daily.  Dispense: 90 tablet; Refill: 3  2. Chronic low back pain, unspecified back pain laterality, with sciatica presence unspecified - ToxASSURE Select 13 (MW), Urine - oxyCODONE-acetaminophen (PERCOCET) 10-325 MG tablet; Take 1-2 tablets twice a day as needed  Dispense: 60 tablet; Refill: 0 - oxyCODONE-acetaminophen (PERCOCET) 10-325 MG tablet; Take 1-2 tablets twice a day as needed  Dispense: 60 tablet; Refill: 0 - oxyCODONE-acetaminophen (PERCOCET) 10-325 MG tablet; Take 1 tablet by mouth 2 (two) times daily.  Dispense: 60 tablet; Refill: 0 - methylPREDNISolone acetate (DEPO-MEDROL) injection 80 mg; Inject 1 mL (80 mg total) into the muscle once.  3. Prediabetes - CBC with Differential/Platelet - CMP14+EGFR - Lipid panel - Bayer DCA Hb A1c Waived  4. Well adult exam -  PSA    Current Outpatient Prescriptions:  .  bisoprolol-hydrochlorothiazide (ZIAC) 10-6.25 MG tablet, Take 1 tablet by mouth daily., Disp: 90 tablet, Rfl: 1 .  oxyCODONE-acetaminophen (PERCOCET) 10-325 MG tablet, Take 1-2 tablets twice a day as needed, Disp: 60 tablet, Rfl: 0 .  oxyCODONE-acetaminophen (PERCOCET) 10-325 MG tablet, Take 1-2 tablets twice a day as needed, Disp: 60 tablet, Rfl: 0 .  oxyCODONE-acetaminophen (PERCOCET) 10-325 MG tablet, Take 1 tablet by mouth 2 (two) times daily., Disp: 60 tablet, Rfl: 0 .  cyclobenzaprine (FLEXERIL) 10 MG tablet, Take 1 tablet (10 mg total) by mouth 3 (three) times daily as needed for muscle spasms., Disp: 90 tablet, Rfl: 1 .  lisinopril-hydrochlorothiazide (PRINZIDE,ZESTORETIC) 20-25 MG tablet, Take 1 tablet by mouth daily., Disp: 90 tablet, Rfl: 3 Continue all other maintenance medications as  listed above.  Follow up plan: Return in about 4 weeks (around 12/13/2016) for recheck.  Educational handout given for [place] PA-C [facility] [address]  [place], [place] [address] [phone]   11/15/2016, 3:30 PM

## 2016-11-16 LAB — CMP14+EGFR
ALK PHOS: 69 IU/L (ref 39–117)
ALT: 24 IU/L (ref 0–44)
AST: 23 IU/L (ref 0–40)
Albumin/Globulin Ratio: 1.4 (ref 1.2–2.2)
Albumin: 4.3 g/dL (ref 3.5–5.5)
BUN/Creatinine Ratio: 13 (ref 9–20)
BUN: 16 mg/dL (ref 6–24)
Bilirubin Total: 0.4 mg/dL (ref 0.0–1.2)
CALCIUM: 9.4 mg/dL (ref 8.7–10.2)
CO2: 25 mmol/L (ref 20–29)
CREATININE: 1.26 mg/dL (ref 0.76–1.27)
Chloride: 99 mmol/L (ref 96–106)
GFR calc Af Amer: 74 mL/min/{1.73_m2} (ref >59)
GFR, EST NON AFRICAN AMERICAN: 64 mL/min/{1.73_m2} (ref >59)
GLOBULIN, TOTAL: 3 g/dL (ref 1.5–4.5)
GLUCOSE: 139 mg/dL — AB (ref 65–99)
Potassium: 4.4 mmol/L (ref 3.5–5.2)
SODIUM: 140 mmol/L (ref 134–144)
Total Protein: 7.3 g/dL (ref 6.0–8.5)

## 2016-11-16 LAB — LIPID PANEL
CHOL/HDL RATIO: 4.6 ratio (ref 0.0–5.0)
CHOLESTEROL TOTAL: 179 mg/dL (ref 100–199)
HDL: 39 mg/dL — AB (ref >39)
LDL CALC: 118 mg/dL — AB (ref 0–99)
TRIGLYCERIDES: 109 mg/dL (ref 0–149)
VLDL CHOLESTEROL CAL: 22 mg/dL (ref 5–40)

## 2016-11-16 LAB — CBC WITH DIFFERENTIAL/PLATELET
BASOS: 1 %
Basophils Absolute: 0.1 10*3/uL (ref 0.0–0.2)
EOS (ABSOLUTE): 0.2 10*3/uL (ref 0.0–0.4)
EOS: 3 %
HEMATOCRIT: 41.1 % (ref 37.5–51.0)
Hemoglobin: 13.9 g/dL (ref 13.0–17.7)
IMMATURE GRANULOCYTES: 1 %
Immature Grans (Abs): 0.1 10*3/uL (ref 0.0–0.1)
LYMPHS ABS: 1 10*3/uL (ref 0.7–3.1)
LYMPHS: 12 %
MCH: 32.5 pg (ref 26.6–33.0)
MCHC: 33.8 g/dL (ref 31.5–35.7)
MCV: 96 fL (ref 79–97)
Monocytes Absolute: 0.8 10*3/uL (ref 0.1–0.9)
Monocytes: 10 %
NEUTROS ABS: 6 10*3/uL (ref 1.4–7.0)
NEUTROS PCT: 73 %
Platelets: 319 10*3/uL (ref 150–379)
RBC: 4.28 x10E6/uL (ref 4.14–5.80)
RDW: 12.9 % (ref 12.3–15.4)
WBC: 8.2 10*3/uL (ref 3.4–10.8)

## 2016-11-16 LAB — PSA: Prostate Specific Ag, Serum: 0.3 ng/mL (ref 0.0–4.0)

## 2016-11-18 LAB — TOXASSURE SELECT 13 (MW), URINE

## 2016-12-02 ENCOUNTER — Other Ambulatory Visit: Payer: Self-pay | Admitting: Physician Assistant

## 2016-12-13 ENCOUNTER — Encounter: Payer: Self-pay | Admitting: Physician Assistant

## 2016-12-13 ENCOUNTER — Ambulatory Visit (INDEPENDENT_AMBULATORY_CARE_PROVIDER_SITE_OTHER): Payer: Medicaid Other | Admitting: Physician Assistant

## 2016-12-13 VITALS — BP 120/81 | HR 62 | Ht 71.0 in | Wt >= 6400 oz

## 2016-12-13 DIAGNOSIS — R1012 Left upper quadrant pain: Secondary | ICD-10-CM

## 2016-12-13 DIAGNOSIS — R0609 Other forms of dyspnea: Secondary | ICD-10-CM | POA: Diagnosis not present

## 2016-12-13 DIAGNOSIS — I10 Essential (primary) hypertension: Secondary | ICD-10-CM | POA: Diagnosis not present

## 2016-12-13 NOTE — Progress Notes (Signed)
BP 120/81   Pulse 62   Ht 5' 11"  (1.803 m)   Wt (!) 404 lb 6.4 oz (183.4 kg)   BMI 56.40 kg/m    Subjective:    Patient ID: Bradley Torres, male    DOB: Aug 10, 1960, 56 y.o.   MRN: [id number]  HPI: Bradley Torres is a 56 y.o. male presenting on 12/13/2016 for Follow-up and Hypertension This patient is coming in for 4 week recheck on his blood pressure medication. He was stopped only start hand last month and started with lisinopril or thiazide 20/25 mg 1 daily. He is tolerating it well. His blood pressure readings are very good. He is expressing dyspnea on exertion. He is morbidly obese. But he states that this is a new and different symptom. He can walk across the room with something heavy and has to stop and rest. He does have known degenerative disc disease. His last cardiology of visit was in 2016 and a stress test was negative. We have discussed his risk for heart disease and we will make him a referral to his cardiologist, Dr Jacinta Shoe.  He does not seem to be retaining any extra fluid around his legs. He knows to watch for this. He also is having just some generalized abdominal discomfort. He has had slightly looser stools. But he relates that to having taken some Ex-Lax. He denies any fever or chills. We will draw blood today to check his comprehensive metabolic and CBC.  Relevant past medical, surgical, family and social history reviewed and updated as indicated. Allergies and medications reviewed and updated.  Past Medical History:  Diagnosis Date  . Chronic back pain   . Essential hypertension   . Morbid obesity ([place])   . Panic disorder   . Prediabetes   . Shoulder pain, left    Following fall    Past Surgical History:  Procedure Laterality Date  . Ankle fracture sugery Left   . LUMBAR SPINE SURGERY      Review of Systems  Constitutional: Positive for diaphoresis and fatigue. Negative for appetite change.  HENT: Negative.   Eyes: Negative.  Negative for pain and  visual disturbance.  Respiratory: Positive for shortness of breath and wheezing. Negative for cough and chest tightness.   Cardiovascular: Negative.  Negative for chest pain, palpitations and leg swelling.  Gastrointestinal: Negative.  Negative for abdominal pain, diarrhea, nausea and vomiting.  Endocrine: Negative.   Genitourinary: Negative.   Musculoskeletal: Positive for arthralgias, back pain and gait problem.  Skin: Negative.  Negative for color change and rash.  Neurological: Negative for weakness, numbness and headaches.  Psychiatric/Behavioral: Negative.     Allergies as of 12/13/2016      Reactions   Ibuprofen    Feels that it caused rectal bleeding   Losartan    rash      Medication List       Accurate as of 12/13/16  4:06 PM. Always use your most recent med list.          bisoprolol-hydrochlorothiazide 10-6.25 MG tablet Commonly known as:  ZIAC Take 1 tablet by mouth daily.   cyclobenzaprine 10 MG tablet Commonly known as:  FLEXERIL Take 1 tablet (10 mg total) by mouth 3 (three) times daily as needed for muscle spasms.   lisinopril-hydrochlorothiazide 20-25 MG tablet Commonly known as:  PRINZIDE,ZESTORETIC Take 1 tablet by mouth daily.   oxyCODONE-acetaminophen 10-325 MG tablet Commonly known as:  PERCOCET Take 1-2 tablets twice a day as needed  oxyCODONE-acetaminophen 10-325 MG tablet Commonly known as:  PERCOCET Take 1-2 tablets twice a day as needed   oxyCODONE-acetaminophen 10-325 MG tablet Commonly known as:  PERCOCET Take 1 tablet by mouth 2 (two) times daily.            Discharge Care Instructions        Start     Ordered   12/13/16 0000  Ambulatory referral to Cardiology     12/13/16 1208   12/13/16 0000  CBC with Differential     12/13/16 1211   12/13/16 0000  CMP14+EGFR     12/13/16 1211         Objective:    BP 120/81   Pulse 62   Ht 5' 11"  (1.803 m)   Wt (!) 404 lb 6.4 oz (183.4 kg)   BMI 56.40 kg/m   Allergies    Allergen Reactions  . Ibuprofen     Feels that it caused rectal bleeding  . Losartan     rash    Physical Exam  Constitutional: No distress.  Morbidly obese male comfortable while sitting. After walking the hallway to check out some increased diaphoresis. He is able to speak a few words while walking.  HENT:  Head: Normocephalic and atraumatic.  Eyes: Pupils are equal, round, and reactive to light. Conjunctivae and EOM are normal.  Neck: Normal range of motion. Neck supple.  Cardiovascular: Normal rate, regular rhythm and normal heart sounds.   Pulmonary/Chest: Effort normal and breath sounds normal.  Abdominal: Soft. Bowel sounds are normal.  Musculoskeletal: Normal range of motion.  Skin: Skin is warm. He is diaphoretic.    Results for orders placed or performed in visit on 11/15/16  ToxASSURE Select 13 (MW), Urine  Result Value Ref Range   Summary FINAL   CBC with Differential/Platelet  Result Value Ref Range   WBC 8.2 3.4 - 10.8 x10E3/uL   RBC 4.28 4.14 - 5.80 x10E6/uL   Hemoglobin 13.9 13.0 - 17.7 g/dL   Hematocrit 41.1 37.5 - 51.0 %   MCV 96 79 - 97 fL   MCH 32.5 26.6 - 33.0 pg   MCHC 33.8 31.5 - 35.7 g/dL   RDW 12.9 12.3 - 15.4 %   Platelets 319 150 - 379 x10E3/uL   Neutrophils 73 Not Estab. %   Lymphs 12 Not Estab. %   Monocytes 10 Not Estab. %   Eos 3 Not Estab. %   Basos 1 Not Estab. %   Neutrophils Absolute 6.0 1.4 - 7.0 x10E3/uL   Lymphocytes Absolute 1.0 0.7 - 3.1 x10E3/uL   Monocytes Absolute 0.8 0.1 - 0.9 x10E3/uL   EOS (ABSOLUTE) 0.2 0.0 - 0.4 x10E3/uL   Basophils Absolute 0.1 0.0 - 0.2 x10E3/uL   Immature Granulocytes 1 Not Estab. %   Immature Grans (Abs) 0.1 0.0 - 0.1 x10E3/uL  CMP14+EGFR  Result Value Ref Range   Glucose 139 (H) 65 - 99 mg/dL   BUN 16 6 - 24 mg/dL   Creatinine, Ser 1.26 0.76 - 1.27 mg/dL   GFR calc non Af Amer 64 >59 mL/min/1.73   GFR calc Af Amer 74 >59 mL/min/1.73   BUN/Creatinine Ratio 13 9 - 20   Sodium 140 134 - 144  mmol/L   Potassium 4.4 3.5 - 5.2 mmol/L   Chloride 99 96 - 106 mmol/L   CO2 25 20 - 29 mmol/L   Calcium 9.4 8.7 - 10.2 mg/dL   Total Protein 7.3 6.0 - 8.5 g/dL  Albumin 4.3 3.5 - 5.5 g/dL   Globulin, Total 3.0 1.5 - 4.5 g/dL   Albumin/Globulin Ratio 1.4 1.2 - 2.2   Bilirubin Total 0.4 0.0 - 1.2 mg/dL   Alkaline Phosphatase 69 39 - 117 IU/L   AST 23 0 - 40 IU/L   ALT 24 0 - 44 IU/L  Lipid panel  Result Value Ref Range   Cholesterol, Total 179 100 - 199 mg/dL   Triglycerides 109 0 - 149 mg/dL   HDL 39 (L) >39 mg/dL   VLDL Cholesterol Cal 22 5 - 40 mg/dL   LDL Calculated 118 (H) 0 - 99 mg/dL   Chol/HDL Ratio 4.6 0.0 - 5.0 ratio  Bayer DCA Hb A1c Waived  Result Value Ref Range   Bayer DCA Hb A1c Waived 5.6 <7.0 %  PSA  Result Value Ref Range   Prostate Specific Ag, Serum 0.3 0.0 - 4.0 ng/mL      Assessment & Plan:   1. Dyspnea on exertion - Ambulatory referral to Cardiology  2. Left upper quadrant pain - CBC with Differential - CMP14+EGFR  3. Essential hypertension    Current Outpatient Prescriptions:  .  bisoprolol-hydrochlorothiazide (ZIAC) 10-6.25 MG tablet, Take 1 tablet by mouth daily., Disp: 90 tablet, Rfl: 1 .  cyclobenzaprine (FLEXERIL) 10 MG tablet, Take 1 tablet (10 mg total) by mouth 3 (three) times daily as needed for muscle spasms., Disp: 90 tablet, Rfl: 1 .  lisinopril-hydrochlorothiazide (PRINZIDE,ZESTORETIC) 20-25 MG tablet, Take 1 tablet by mouth daily., Disp: 90 tablet, Rfl: 3 .  oxyCODONE-acetaminophen (PERCOCET) 10-325 MG tablet, Take 1-2 tablets twice a day as needed, Disp: 60 tablet, Rfl: 0 .  oxyCODONE-acetaminophen (PERCOCET) 10-325 MG tablet, Take 1-2 tablets twice a day as needed, Disp: 60 tablet, Rfl: 0 .  oxyCODONE-acetaminophen (PERCOCET) 10-325 MG tablet, Take 1 tablet by mouth 2 (two) times daily., Disp: 60 tablet, Rfl: 0 Continue all other maintenance medications as listed above.  Follow up plan: Return in about 2 months (around  02/12/2017) for recheck.  Educational handout given for [employer] PA-C [place] [address]  [place], [place] [address] [phone]   12/13/2016, 4:06 PM

## 2016-12-13 NOTE — Patient Instructions (Signed)
In a few days you may receive a survey in the mail or online from [employer] regarding your visit with us today. Please take a moment to fill this out. Your feedback is very important to our whole office. It can help us better understand your needs as well as improve your experience and satisfaction. Thank you for taking your time to complete it. We care about you.  Hulen Mandler, PA-C  

## 2016-12-14 LAB — CMP14+EGFR
ALBUMIN: 4.2 g/dL (ref 3.5–5.5)
ALK PHOS: 73 IU/L (ref 39–117)
ALT: 19 IU/L (ref 0–44)
AST: 19 IU/L (ref 0–40)
Albumin/Globulin Ratio: 1.4 (ref 1.2–2.2)
BUN/Creatinine Ratio: 15 (ref 9–20)
BUN: 27 mg/dL — AB (ref 6–24)
Bilirubin Total: 0.6 mg/dL (ref 0.0–1.2)
CO2: 26 mmol/L (ref 20–29)
CREATININE: 1.82 mg/dL — AB (ref 0.76–1.27)
Calcium: 9.8 mg/dL (ref 8.7–10.2)
Chloride: 98 mmol/L (ref 96–106)
GFR calc Af Amer: 47 mL/min/{1.73_m2} — ABNORMAL LOW (ref >59)
GFR, EST NON AFRICAN AMERICAN: 41 mL/min/{1.73_m2} — AB (ref >59)
GLOBULIN, TOTAL: 2.9 g/dL (ref 1.5–4.5)
GLUCOSE: 112 mg/dL — AB (ref 65–99)
Potassium: 4.4 mmol/L (ref 3.5–5.2)
SODIUM: 140 mmol/L (ref 134–144)
Total Protein: 7.1 g/dL (ref 6.0–8.5)

## 2016-12-14 LAB — CBC WITH DIFFERENTIAL/PLATELET
BASOS ABS: 0.1 10*3/uL (ref 0.0–0.2)
Basos: 1 %
EOS (ABSOLUTE): 0.2 10*3/uL (ref 0.0–0.4)
EOS: 1 %
HEMATOCRIT: 44 % (ref 37.5–51.0)
HEMOGLOBIN: 14.6 g/dL (ref 13.0–17.7)
IMMATURE GRANULOCYTES: 1 %
Immature Grans (Abs): 0.1 10*3/uL (ref 0.0–0.1)
LYMPHS: 10 %
Lymphocytes Absolute: 1.1 10*3/uL (ref 0.7–3.1)
MCH: 33 pg (ref 26.6–33.0)
MCHC: 33.2 g/dL (ref 31.5–35.7)
MCV: 100 fL — ABNORMAL HIGH (ref 79–97)
MONOCYTES: 8 %
Monocytes Absolute: 0.8 10*3/uL (ref 0.1–0.9)
NEUTROS PCT: 79 %
Neutrophils Absolute: 8.9 10*3/uL — ABNORMAL HIGH (ref 1.4–7.0)
Platelets: 313 10*3/uL (ref 150–379)
RBC: 4.42 x10E6/uL (ref 4.14–5.80)
RDW: 13.2 % (ref 12.3–15.4)
WBC: 11.1 10*3/uL — AB (ref 3.4–10.8)

## 2017-01-03 ENCOUNTER — Other Ambulatory Visit: Payer: Self-pay | Admitting: Physician Assistant

## 2017-01-06 ENCOUNTER — Ambulatory Visit: Payer: Self-pay | Admitting: Cardiovascular Disease

## 2017-01-13 ENCOUNTER — Encounter: Payer: Self-pay | Admitting: Physician Assistant

## 2017-01-13 ENCOUNTER — Ambulatory Visit (INDEPENDENT_AMBULATORY_CARE_PROVIDER_SITE_OTHER): Payer: Medicaid Other | Admitting: Physician Assistant

## 2017-01-13 VITALS — BP 127/85 | HR 59 | Temp 98.5°F | Ht 71.0 in | Wt >= 6400 oz

## 2017-01-13 DIAGNOSIS — M545 Low back pain: Secondary | ICD-10-CM | POA: Diagnosis not present

## 2017-01-13 DIAGNOSIS — R195 Other fecal abnormalities: Secondary | ICD-10-CM | POA: Diagnosis not present

## 2017-01-13 DIAGNOSIS — G8929 Other chronic pain: Secondary | ICD-10-CM | POA: Diagnosis not present

## 2017-01-13 DIAGNOSIS — R1032 Left lower quadrant pain: Secondary | ICD-10-CM

## 2017-01-13 MED ORDER — OXYCODONE-ACETAMINOPHEN 10-325 MG PO TABS: ORAL_TABLET | ORAL | 0 refills | Status: DC

## 2017-01-13 MED ORDER — OXYCODONE-ACETAMINOPHEN 10-325 MG PO TABS: 1.0000 | ORAL_TABLET | Freq: Two times a day (BID) | ORAL | 0 refills | Status: DC

## 2017-01-13 MED ORDER — CIPROFLOXACIN HCL 500 MG PO TABS: 500.0000 mg | ORAL_TABLET | Freq: Two times a day (BID) | ORAL | 1 refills | Status: DC

## 2017-01-13 NOTE — Patient Instructions (Signed)
In a few days you may receive a survey in the mail or online from [employer] regarding your visit with us today. Please take a moment to fill this out. Your feedback is very important to our whole office. It can help us better understand your needs as well as improve your experience and satisfaction. Thank you for taking your time to complete it. We care about you.  Aryani Daffern, PA-C  

## 2017-01-13 NOTE — Progress Notes (Signed)
BP 127/85   Pulse (!) 59   Temp 98.5 F (36.9 C) (Oral)   Ht 5' 11"  (1.803 m)   Wt (!) 406 lb (184.2 kg)   BMI 56.63 kg/m    Subjective:    Patient ID: Bradley Torres, male    DOB: 1960/10/14, 56 y.o.   MRN: [id number]  HPI: Bradley Torres is a 56 y.o. male presenting on 01/13/2017 for Follow-up (1  month HTN) Patient comes in today for recheck on blood pressure. Is been 1 month. The blood pressure is doing very well on his current medications. He is continuing to complain of abdominal pain this on the left side abnormal stools at times. It will last 3-4 days. He states he has seen some mucus and yellow changes but denies any blood. He does need a gastroenterology referral.  Relevant past medical, surgical, family and social history reviewed and updated as indicated. Allergies and medications reviewed and updated.  Past Medical History:  Diagnosis Date  . Chronic back pain   . Essential hypertension   . Morbid obesity ([place])   . Panic disorder   . Prediabetes   . Shoulder pain, left    Following fall    Past Surgical History:  Procedure Laterality Date  . Ankle fracture sugery Left   . LUMBAR SPINE SURGERY      Review of Systems  Constitutional: Positive for fatigue. Negative for appetite change and fever.  HENT: Negative.   Eyes: Negative.  Negative for pain and visual disturbance.  Respiratory: Negative.  Negative for cough, chest tightness, shortness of breath and wheezing.   Cardiovascular: Negative.  Negative for chest pain, palpitations and leg swelling.  Gastrointestinal: Positive for abdominal distention, abdominal pain and diarrhea. Negative for blood in stool, nausea and vomiting.  Endocrine: Negative.   Genitourinary: Negative.   Musculoskeletal: Positive for arthralgias and back pain.  Skin: Negative.  Negative for color change and rash.  Neurological: Negative.  Negative for weakness, numbness and headaches.  Psychiatric/Behavioral: Negative.      Allergies as of 01/13/2017      Reactions   Ibuprofen    Feels that it caused rectal bleeding   Losartan    rash      Medication List       Accurate as of 01/13/17  1:50 PM. Always use your most recent med list.          bisoprolol-hydrochlorothiazide 10-6.25 MG tablet Commonly known as:  ZIAC Take 1 tablet by mouth daily.   ciprofloxacin 500 MG tablet Commonly known as:  CIPRO Take 1 tablet (500 mg total) by mouth 2 (two) times daily.   cyclobenzaprine 10 MG tablet Commonly known as:  FLEXERIL TAKE 1 TABLET BY MOUTH THREE TIMES DAILY AS NEEDED   lisinopril-hydrochlorothiazide 20-25 MG tablet Commonly known as:  PRINZIDE,ZESTORETIC Take 1 tablet by mouth daily.   oxyCODONE-acetaminophen 10-325 MG tablet Commonly known as:  PERCOCET Take 1-2 tablets twice a day as needed   oxyCODONE-acetaminophen 10-325 MG tablet Commonly known as:  PERCOCET Take 1-2 tablets twice a day as needed   oxyCODONE-acetaminophen 10-325 MG tablet Commonly known as:  PERCOCET Take 1 tablet by mouth 2 (two) times daily.          Objective:    BP 127/85   Pulse (!) 59   Temp 98.5 F (36.9 C) (Oral)   Ht 5' 11"  (1.803 m)   Wt (!) 406 lb (184.2 kg)   BMI 56.63 kg/m  Allergies  Allergen Reactions  . Ibuprofen     Feels that it caused rectal bleeding  . Losartan     rash    Physical Exam  Constitutional: He appears well-developed and well-nourished. No distress.  HENT:  Head: Normocephalic and atraumatic.  Eyes: Pupils are equal, round, and reactive to light. Conjunctivae and EOM are normal.  Cardiovascular: Normal rate, regular rhythm and normal heart sounds.   Pulmonary/Chest: Effort normal and breath sounds normal. No respiratory distress.  Abdominal: Soft. Bowel sounds are normal. There is tenderness in the left upper quadrant and left lower quadrant. There is no CVA tenderness.  Musculoskeletal:       Lumbar back: He exhibits decreased range of motion, pain and  spasm.  Skin: Skin is warm and dry.  Psychiatric: He has a normal mood and affect. His behavior is normal.  Nursing note and vitals reviewed.   Results for orders placed or performed in visit on 12/13/16  CBC with Differential  Result Value Ref Range   WBC 11.1 (H) 3.4 - 10.8 x10E3/uL   RBC 4.42 4.14 - 5.80 x10E6/uL   Hemoglobin 14.6 13.0 - 17.7 g/dL   Hematocrit 44.0 37.5 - 51.0 %   MCV 100 (H) 79 - 97 fL   MCH 33.0 26.6 - 33.0 pg   MCHC 33.2 31.5 - 35.7 g/dL   RDW 13.2 12.3 - 15.4 %   Platelets 313 150 - 379 x10E3/uL   Neutrophils 79 Not Estab. %   Lymphs 10 Not Estab. %   Monocytes 8 Not Estab. %   Eos 1 Not Estab. %   Basos 1 Not Estab. %   Neutrophils Absolute 8.9 (H) 1.4 - 7.0 x10E3/uL   Lymphocytes Absolute 1.1 0.7 - 3.1 x10E3/uL   Monocytes Absolute 0.8 0.1 - 0.9 x10E3/uL   EOS (ABSOLUTE) 0.2 0.0 - 0.4 x10E3/uL   Basophils Absolute 0.1 0.0 - 0.2 x10E3/uL   Immature Granulocytes 1 Not Estab. %   Immature Grans (Abs) 0.1 0.0 - 0.1 x10E3/uL  CMP14+EGFR  Result Value Ref Range   Glucose 112 (H) 65 - 99 mg/dL   BUN 27 (H) 6 - 24 mg/dL   Creatinine, Ser 1.82 (H) 0.76 - 1.27 mg/dL   GFR calc non Af Amer 41 (L) >59 mL/min/1.73   GFR calc Af Amer 47 (L) >59 mL/min/1.73   BUN/Creatinine Ratio 15 9 - 20   Sodium 140 134 - 144 mmol/L   Potassium 4.4 3.5 - 5.2 mmol/L   Chloride 98 96 - 106 mmol/L   CO2 26 20 - 29 mmol/L   Calcium 9.8 8.7 - 10.2 mg/dL   Total Protein 7.1 6.0 - 8.5 g/dL   Albumin 4.2 3.5 - 5.5 g/dL   Globulin, Total 2.9 1.5 - 4.5 g/dL   Albumin/Globulin Ratio 1.4 1.2 - 2.2   Bilirubin Total 0.6 0.0 - 1.2 mg/dL   Alkaline Phosphatase 73 39 - 117 IU/L   AST 19 0 - 40 IU/L   ALT 19 0 - 44 IU/L      Assessment & Plan:   1. Left lower quadrant pain - Ambulatory referral to Gastroenterology - ciprofloxacin (CIPRO) 500 MG tablet; Take 1 tablet (500 mg total) by mouth 2 (two) times daily.  Dispense: 20 tablet; Refill: 1  2. Chronic low back pain,  unspecified back pain laterality, with sciatica presence unspecified - oxyCODONE-acetaminophen (PERCOCET) 10-325 MG tablet; Take 1-2 tablets twice a day as needed  Dispense: 60 tablet; Refill: 0 - oxyCODONE-acetaminophen (  PERCOCET) 10-325 MG tablet; Take 1-2 tablets twice a day as needed  Dispense: 60 tablet; Refill: 0 - oxyCODONE-acetaminophen (PERCOCET) 10-325 MG tablet; Take 1 tablet by mouth 2 (two) times daily.  Dispense: 60 tablet; Refill: 0  3. Abnormal stools - ciprofloxacin (CIPRO) 500 MG tablet; Take 1 tablet (500 mg total) by mouth 2 (two) times daily.  Dispense: 20 tablet; Refill: 1    Current Outpatient Prescriptions:  .  bisoprolol-hydrochlorothiazide (ZIAC) 10-6.25 MG tablet, Take 1 tablet by mouth daily., Disp: 90 tablet, Rfl: 1 .  cyclobenzaprine (FLEXERIL) 10 MG tablet, TAKE 1 TABLET BY MOUTH THREE TIMES DAILY AS NEEDED, Disp: 90 tablet, Rfl: 0 .  lisinopril-hydrochlorothiazide (PRINZIDE,ZESTORETIC) 20-25 MG tablet, Take 1 tablet by mouth daily., Disp: 90 tablet, Rfl: 3 .  oxyCODONE-acetaminophen (PERCOCET) 10-325 MG tablet, Take 1-2 tablets twice a day as needed, Disp: 60 tablet, Rfl: 0 .  oxyCODONE-acetaminophen (PERCOCET) 10-325 MG tablet, Take 1-2 tablets twice a day as needed, Disp: 60 tablet, Rfl: 0 .  oxyCODONE-acetaminophen (PERCOCET) 10-325 MG tablet, Take 1 tablet by mouth 2 (two) times daily., Disp: 60 tablet, Rfl: 0 .  ciprofloxacin (CIPRO) 500 MG tablet, Take 1 tablet (500 mg total) by mouth 2 (two) times daily., Disp: 20 tablet, Rfl: 1 Continue all other maintenance medications as listed above.  Follow up plan: Return in about 15 weeks (around 04/28/2017) for recheck.  Educational handout given for [place] PA-C [place] [address]  [place], [place] [address] [phone]   01/13/2017, 1:50 PM

## 2017-01-20 ENCOUNTER — Encounter: Payer: Self-pay | Admitting: Internal Medicine

## 2017-01-30 ENCOUNTER — Ambulatory Visit: Payer: Medicaid Other | Admitting: Cardiovascular Disease

## 2017-02-14 ENCOUNTER — Ambulatory Visit: Payer: Medicaid Other | Admitting: Physician Assistant

## 2017-03-07 ENCOUNTER — Ambulatory Visit: Payer: Medicaid Other | Admitting: Gastroenterology

## 2017-03-14 ENCOUNTER — Ambulatory Visit: Payer: Medicaid Other | Admitting: Gastroenterology

## 2017-03-14 ENCOUNTER — Encounter: Payer: Self-pay | Admitting: Gastroenterology

## 2017-03-14 DIAGNOSIS — R1032 Left lower quadrant pain: Secondary | ICD-10-CM | POA: Diagnosis not present

## 2017-03-14 DIAGNOSIS — R194 Change in bowel habit: Secondary | ICD-10-CM | POA: Insufficient documentation

## 2017-03-14 MED ORDER — METRONIDAZOLE 500 MG PO TABS: 500.0000 mg | ORAL_TABLET | Freq: Three times a day (TID) | ORAL | 0 refills | Status: DC

## 2017-03-14 NOTE — Patient Instructions (Signed)
1. Take cipro (twice daily) and metronidazole (three times daily with food) for 10 days. Cipro was already provided by your PCP.  2. We will contact you to schedule a colonoscopy once you have been evaluated by the heart doctor.

## 2017-03-14 NOTE — Progress Notes (Signed)
Primary Care Physician:  Terald Sleeper, PA-C  Primary Gastroenterologist:  Garfield Cornea, MD   Chief Complaint  Patient presents with  . Abdominal Pain  . Diarrhea    yellow mucous    HPI:  Bradley Torres is a 56 y.o. male here at the request of [name], PA-C for further evaluation of LLQ pain.   Pain begins in left flank and radiates to right abdomen. Feels like a boil inside.  Sees a lot yellow mucous in the stool. No blood in stool. Stools are runny, very soft. May go few days in between stools. Then some hard stools at time. Pain eases up after "the boil busts". Then stools go back to normal stool in between episodes. May be good for several weeks. Cipro provided for episodes. Got it filled yesterday. Nocturnal burining in lower abd for 1-2 years. Keeps up at night. No heartburn, dysphagia, vomiting. No unintentional weight loss.   Patient also complains of fatigue, dizziness, and diaphoresis. At first he said it seemed to correlate with his abd pain. Upon further questioning, patient has exertional component to his symptoms. He is scheduled to see his cardiologist tomorrow.   No prior colonoscopy. Family history significant for colon cancer, parent before age of 24. Sister died due to autoimmune hepatitis at age 28.   Current Outpatient Medications  Medication Sig Dispense Refill  . bisoprolol-hydrochlorothiazide (ZIAC) 10-6.25 MG tablet Take 1 tablet by mouth daily. 90 tablet 1  . cyclobenzaprine (FLEXERIL) 10 MG tablet TAKE 1 TABLET BY MOUTH THREE TIMES DAILY AS NEEDED 90 tablet 0  . lisinopril-hydrochlorothiazide (PRINZIDE,ZESTORETIC) 20-25 MG tablet Take 1 tablet by mouth daily. 90 tablet 3  . oxyCODONE-acetaminophen (PERCOCET) 10-325 MG tablet Take 1 tablet by mouth 2 (two) times daily. 60 tablet 0   No current facility-administered medications for this visit.     Allergies as of 03/14/2017 - Review Complete 03/14/2017  Allergen Reaction Noted  . Ibuprofen  11/20/2013   . Losartan  03/17/2016    Past Medical History:  Diagnosis Date  . Chronic back pain   . Essential hypertension   . Morbid obesity ([place])   . Panic disorder   . Prediabetes   . Shoulder pain, left    Following fall    Past Surgical History:  Procedure Laterality Date  . Ankle fracture sugery Left   . LUMBAR SPINE SURGERY      Family History  Problem Relation Age of Onset  . Colon cancer Mother        died at age 40  . Heart attack Father   . Hepatitis Sister 42       Autoimmune  . Heart attack Paternal Grandfather     Social History   Socioeconomic History  . Marital status: Single    Spouse name: Not on file  . Number of children: Not on file  . Years of education: Not on file  . Highest education level: Not on file  Social Needs  . Financial resource strain: Not on file  . Food insecurity - worry: Not on file  . Food insecurity - inability: Not on file  . Transportation needs - medical: Not on file  . Transportation needs - non-medical: Not on file  Occupational History  . Not on file  Tobacco Use  . Smoking status: Never Smoker  . Smokeless tobacco: Never Used  Substance and Sexual Activity  . Alcohol use: No  . Drug use: Yes    Types: Marijuana  .  Sexual activity: Not on file  Other Topics Concern  . Not on file  Social History Narrative  . Not on file      ROS:  General: Negative for anorexia, weight loss, fever, chills, fatigue, + weakness. Eyes: Negative for vision changes.  ENT: Negative for hoarseness, difficulty swallowing , nasal congestion. CV: Negative for chest pain, angina, +palpitations, dyspnea on exertion, +peripheral edema.  Respiratory: Negative for dyspnea at rest, dyspnea on exertion, cough, sputum, wheezing.  GI: See history of present illness. GU:  Negative for dysuria, hematuria, urinary incontinence, urinary frequency, nocturnal urination.  MS: Negative for joint pain, low back pain.  Derm: Negative for rash or itching.   Neuro: Negative for weakness, abnormal sensation, seizure, frequent headaches, memory loss, confusion.  Psych: Negative for anxiety, depression, suicidal ideation, hallucinations.  Endo: Negative for unusual weight change.  Heme: Negative for bruising or bleeding. Allergy: Negative for rash or hives.    Physical Examination:  BP (!) 153/91   Pulse 76   Temp (!) 96.8 F (36 C) (Oral)   Ht 5\' 11"  (1.803 m)   Wt (!) 412 lb 6.4 oz (187.1 kg)   BMI 57.52 kg/m    General: morbidly obese, WM in no acute distress. He could not lay down on exam table therefore exam limited.  Head: Normocephalic, atraumatic.   Eyes: Conjunctiva pink, no icterus. Mouth: Oropharyngeal mucosa moist and pink , no lesions erythema or exudate. Neck: Supple without thyromegaly, masses, or lymphadenopathy.  Lungs: Clear to auscultation bilaterally.  Heart: Regular rate and rhythm, no murmurs rubs or gallops.  Abdomen: Bowel sounds are normal, nontender. Exam limited due to body habitus.   Rectal: could not be performed as patient could not fit our exam table.  Extremities: 2+ lower extremity edema. No clubbing or deformities.  Neuro: Alert and oriented x 4 , grossly normal neurologically.  Skin: Warm and dry, no rash or jaundice.   Psych: Alert and cooperative, normal mood and affect.  Labs: Lab Results  Component Value Date   CREATININE 1.82 (H) 12/13/2016   BUN 27 (H) 12/13/2016   NA 140 12/13/2016   K 4.4 12/13/2016   CL 98 12/13/2016   CO2 26 12/13/2016   Lab Results  Component Value Date   ALT 19 12/13/2016   AST 19 12/13/2016   ALKPHOS 73 12/13/2016   BILITOT 0.6 12/13/2016   Lab Results  Component Value Date   WBC 11.1 (H) 12/13/2016   HGB 14.6 12/13/2016   HCT 44.0 12/13/2016   MCV 100 (H) 12/13/2016   PLT 313 12/13/2016     Imaging Studies: No results found.

## 2017-03-15 ENCOUNTER — Encounter: Payer: Self-pay | Admitting: *Deleted

## 2017-03-15 ENCOUNTER — Ambulatory Visit: Payer: Medicaid Other | Admitting: Cardiovascular Disease

## 2017-03-15 ENCOUNTER — Telehealth: Payer: Self-pay | Admitting: Cardiovascular Disease

## 2017-03-15 ENCOUNTER — Encounter: Payer: Self-pay | Admitting: Cardiovascular Disease

## 2017-03-15 VITALS — BP 130/88 | HR 62 | Ht 72.0 in | Wt >= 6400 oz

## 2017-03-15 DIAGNOSIS — R0609 Other forms of dyspnea: Secondary | ICD-10-CM

## 2017-03-15 DIAGNOSIS — R5383 Other fatigue: Secondary | ICD-10-CM | POA: Diagnosis not present

## 2017-03-15 DIAGNOSIS — R61 Generalized hyperhidrosis: Secondary | ICD-10-CM | POA: Diagnosis not present

## 2017-03-15 DIAGNOSIS — I10 Essential (primary) hypertension: Secondary | ICD-10-CM

## 2017-03-15 NOTE — Telephone Encounter (Signed)
Pre-cert Verification for the following procedure   Lexiscan & echo scheduled for 03/29/2017 at [facility].

## 2017-03-15 NOTE — Progress Notes (Signed)
SUBJECTIVE: The patient presents to reestablish care.  I last saw him on 07/25/2014 and asked him to follow-up in 2-3 months but he never did.   At that time he underwent cardiovascular testing for the evaluation of chest pain. Nuclear stress testing demonstrated probably normal myocardial perfusion with no evidence of reversible ischemia or infarction with soft tissue attenuation artifact seen. Wall motion was normal, LVEF 50%. Overall, it was a low risk study. Echocardiogram demonstrated normal left ventricular systolic function and regional wall motion, EF 55-60%, with mild LVH.  He has a history of hypertension and morbid obesity.  ECG performed in the office today which I ordered and personally interpreted demonstrates normal sinus rhythm with no ischemic ST segment or T-wave abnormalities, nor any arrhythmias.  For the past 1-1/2 years, he has been experiencing progressive exertional dyspnea.  He denies chest pain.  He said he becomes quickly fatigued and lightheaded with minimal exertion.  When he walks in [place] to do grocery shopping, he quickly becomes diaphoretic.  He has short-lived palpitations.  He used to do long care but now says "I cannot do anything anymore ".  His father died at the age of 66 and an MI was suspected.  An autopsy was not performed.  He weighs 415 pounds.   Review of Systems: As per "subjective", otherwise negative.  Allergies  Allergen Reactions  . Ibuprofen     Feels that it caused rectal bleeding  . Losartan     rash    Current Outpatient Medications  Medication Sig Dispense Refill  . bisoprolol-hydrochlorothiazide (ZIAC) 10-6.25 MG tablet Take 1 tablet by mouth daily. 90 tablet 1  . Ciprofloxacin (CIPRO PO) Take by mouth 2 (two) times daily.    . cyclobenzaprine (FLEXERIL) 10 MG tablet TAKE 1 TABLET BY MOUTH THREE TIMES DAILY AS NEEDED 90 tablet 0  . lisinopril-hydrochlorothiazide (PRINZIDE,ZESTORETIC) 20-25 MG tablet Take 1 tablet by  mouth daily. 90 tablet 3  . oxyCODONE-acetaminophen (PERCOCET) 10-325 MG tablet Take 1 tablet by mouth 2 (two) times daily. 60 tablet 0   No current facility-administered medications for this visit.     Past Medical History:  Diagnosis Date  . Chronic back pain   . Essential hypertension   . Morbid obesity ([place])   . Panic disorder   . Prediabetes   . Shoulder pain, left    Following fall    Past Surgical History:  Procedure Laterality Date  . Ankle fracture sugery Left   . LUMBAR SPINE SURGERY      Social History   Socioeconomic History  . Marital status: Single    Spouse name: Not on file  . Number of children: Not on file  . Years of education: Not on file  . Highest education level: Not on file  Social Needs  . Financial resource strain: Not on file  . Food insecurity - worry: Not on file  . Food insecurity - inability: Not on file  . Transportation needs - medical: Not on file  . Transportation needs - non-medical: Not on file  Occupational History  . Not on file  Tobacco Use  . Smoking status: Never Smoker  . Smokeless tobacco: Never Used  Substance and Sexual Activity  . Alcohol use: No  . Drug use: Yes    Types: Marijuana  . Sexual activity: Not on file  Other Topics Concern  . Not on file  Social History Narrative  . Not on file  Vitals:   03/15/17 1038  BP: 130/88  Pulse: 62  SpO2: 92%  Weight: (!) 415 lb (188.2 kg)  Height: 6' (1.829 m)    Wt Readings from Last 3 Encounters:  03/15/17 (!) 415 lb (188.2 kg)  03/14/17 (!) 412 lb 6.4 oz (187.1 kg)  01/13/17 (!) 406 lb (184.2 kg)     PHYSICAL EXAM General: NAD, morbidly obese HEENT: Normal. Neck: Unable to assess JVP due to body habitus. Lungs: Clear to auscultation bilaterally with normal respiratory effort. CV: Regular rate and rhythm, normal S1/S2, no S3/S4, no murmur.  Abdomen: Morbidly obese. Neurologic: Alert and oriented.  Psych: Normal affect. Skin:  Normal. Musculoskeletal: No gross deformities.    ECG: Most recent ECG reviewed.   Labs: Lab Results  Component Value Date/Time   K 4.4 12/13/2016 12:18 PM   BUN 27 (H) 12/13/2016 12:18 PM   CREATININE 1.82 (H) 12/13/2016 12:18 PM   ALT 19 12/13/2016 12:18 PM   HGB 14.6 12/13/2016 12:18 PM     Lipids: Lab Results  Component Value Date/Time   LDLCALC 118 (H) 11/15/2016 12:22 PM   CHOL 179 11/15/2016 12:22 PM   TRIG 109 11/15/2016 12:22 PM   HDL 39 (L) 11/15/2016 12:22 PM       ASSESSMENT AND PLAN: 1.  Progressive exertional dyspnea and fatigue with diaphoresis and lightheadedness: He is morbidly obese which makes symptom interpretation somewhat difficult.  ECG is normal.  Prior stress testing and echocardiography was unremarkable in 2016. He likely has restrictive lung disease due to morbid obesity which could also produce the symptoms.  However, I cannot exclude the possibility of ischemic heart disease. I spoke to him at length about proceeding with right and left heart catheterization and coronary angiography as I am concerned he may have artificial defects with a nuclear stress test due to soft tissue attenuation artifact.  He is worried about coronary angiography and would prefer a noninvasive approach.  I will obtain a 2-day Lexiscan Myoview stress test.  I will also obtain an echocardiogram with contrast enhancement.  2.  Hypertension: Diastolic blood pressure is mildly elevated today.  He needs significant weight loss.  I would consider bariatric surgery but will defer to his PCP.  3. Morbid obesity: See #1 and #2.    Disposition: Follow up 6 weeks.  Time spent: 40 minutes, of which greater than 50% was spent reviewing symptoms, relevant blood tests and studies, and discussing management plan with the patient.   Kate Sable, M.D., F.A.C.C.

## 2017-03-15 NOTE — Patient Instructions (Addendum)
Medication Instructions:  Continue all current medications.  Labwork: none  Testing/Procedures:  Your physician has requested that you have an echocardiogram. Echocardiography is a painless test that uses sound waves to create images of your heart. It provides your doctor with information about the size and shape of your heart and how well your heart's chambers and valves are working. This procedure takes approximately one hour. There are no restrictions for this procedure. (with contrast)  Your physician has requested that you have a lexiscan myoview. For further information please visit [email or web address]. Please follow instruction sheet, as given. (2 day protocol)  Office will contact with results via phone or letter.    Follow-Up: 6 weeks   Any Other Special Instructions Will Be Listed Below (If Applicable).  If you need a refill on your cardiac medications before your next appointment, please call your pharmacy.

## 2017-03-18 ENCOUNTER — Encounter: Payer: Self-pay | Admitting: Gastroenterology

## 2017-03-18 NOTE — Assessment & Plan Note (Signed)
56 y/o morbidly obese male presenting with LLQ pain associated with loose stool/mucous in stool. No prior colonoscopy. FH of CRC, parent before age of 59. Symptoms previously responded to Cipro in the past. Exam is difficult due to body habitus. Would elect to treat empirically for colitis/diverticulitis with combination Cipro/Flagyl. If symptoms resolve, then pursue colonoscopy as next step. In interim, he has cardiology appt tomorrow. Await cardiology evaluation prior to TCS due to exertional SOB/diaphoresis.

## 2017-03-20 NOTE — Progress Notes (Signed)
CC'D TO PCP °

## 2017-03-28 DIAGNOSIS — I219 Acute myocardial infarction, unspecified: Secondary | ICD-10-CM

## 2017-03-28 HISTORY — DX: Acute myocardial infarction, unspecified: I21.9

## 2017-03-29 ENCOUNTER — Encounter (HOSPITAL_COMMUNITY): Admission: RE | Admit: 2017-03-29 | Payer: Medicaid Other | Source: Ambulatory Visit

## 2017-03-29 ENCOUNTER — Encounter (HOSPITAL_COMMUNITY): Payer: Medicaid Other

## 2017-03-29 ENCOUNTER — Telehealth: Payer: Self-pay | Admitting: Gastroenterology

## 2017-03-29 ENCOUNTER — Other Ambulatory Visit (HOSPITAL_COMMUNITY): Payer: Medicaid Other

## 2017-03-29 NOTE — Telephone Encounter (Addendum)
Please let patient know that I have been following his progress with cardiology. I also discussed case with Dr. [name].   He advises patient complete cardiology evaluation prior to scheduling colonoscopy. And based on treatment for diverticulitis (would like to wait at least 3-4 weeks after completion of antibiotics) that would put him having his colonoscopy no sooner than April 18, 2017.Patient has a follow-up with his cardiologist on January 31.  Let's bring him back in for ov to schedule TCS after he sees the cardiologist. He may need deep sedation given morbid obesity.   May use urgent spot.  ADDENDUM: Discussed with Dr. [name]. "Size alone is not a reason to differentiate mode of sedation. If he's a conscious sedation candidate otherwise, I'd go with it."

## 2017-03-29 NOTE — Telephone Encounter (Signed)
Lmom, waiting on a return call.  

## 2017-03-30 ENCOUNTER — Ambulatory Visit (HOSPITAL_COMMUNITY): Payer: Medicaid Other

## 2017-03-30 NOTE — Telephone Encounter (Signed)
Pt notified. Pt needs appointment to schedule TCS after pts cardiology evaluation. Can use an urgent slop per LSL. Pt can be reached later today. Pt is currently driving.

## 2017-03-31 ENCOUNTER — Encounter: Payer: Self-pay | Admitting: Gastroenterology

## 2017-03-31 NOTE — Telephone Encounter (Signed)
Patient scheduled.

## 2017-04-04 ENCOUNTER — Other Ambulatory Visit: Payer: Self-pay | Admitting: Physician Assistant

## 2017-04-04 DIAGNOSIS — I10 Essential (primary) hypertension: Secondary | ICD-10-CM

## 2017-04-06 ENCOUNTER — Ambulatory Visit (HOSPITAL_COMMUNITY)
Admission: RE | Admit: 2017-04-06 | Discharge: 2017-04-06 | Disposition: A | Discharge status: Home / Self Care | Payer: Medicaid Other | Source: Ambulatory Visit | Attending: Cardiovascular Disease | Admitting: Cardiovascular Disease

## 2017-04-06 ENCOUNTER — Encounter (HOSPITAL_COMMUNITY): Payer: Self-pay

## 2017-04-06 ENCOUNTER — Ambulatory Visit (HOSPITAL_BASED_OUTPATIENT_CLINIC_OR_DEPARTMENT_OTHER)
Admission: RE | Admit: 2017-04-06 | Discharge: 2017-04-06 | Disposition: A | Discharge status: Home / Self Care | Payer: Medicaid Other | Source: Ambulatory Visit | Attending: Cardiovascular Disease | Admitting: Cardiovascular Disease

## 2017-04-06 DIAGNOSIS — R0609 Other forms of dyspnea: Secondary | ICD-10-CM | POA: Insufficient documentation

## 2017-04-06 LAB — ECHOCARDIOGRAM COMPLETE
CHL CUP DOP CALC LVOT VTI: 26 cm
CHL CUP MV DEC (S): 229
CHL CUP STROKE VOLUME: 56 mL
CHL CUP TV REG PEAK VELOCITY: 261 cm/s
E decel time: 229 msec
E/e' ratio: 7.3
FS: 33 % (ref 28–44)
IV/PV OW: 1.18
LA diam end sys: 45 mm
LA diam index: 1.41 cm/m2
LA vol: 67.1 mL
LASIZE: 45 mm
LAVOLA4C: 72.8 mL
LAVOLIN: 21 mL/m2
LDCA: 3.14 cm2
LV E/e' medial: 7.3
LV E/e'average: 7.3
LV PW d: 11.9 mm — AB (ref 0.6–1.1)
LV dias vol index: 29 mL/m2
LV e' LATERAL: 12.2 cm/s
LVDIAVOL: 93 mL (ref 62–150)
LVOT SV: 82 mL
LVOT diameter: 20 mm
LVOT peak grad rest: 6 mmHg
LVOTPV: 120 cm/s
LVSYSVOL: 37 mL
LVSYSVOLIN: 12 mL/m2
MV Peak grad: 3 mmHg
MV pk A vel: 82.9 m/s
MV pk E vel: 89 m/s
RV LATERAL S' VELOCITY: 12.4 cm/s
RV sys press: 30 mmHg
Simpson's disk: 60
TAPSE: 28.7 mm
TDI e' lateral: 12.2
TDI e' medial: 7.94
TRMAXVEL: 261 cm/s

## 2017-04-06 MED ORDER — SODIUM CHLORIDE 0.9% FLUSH
INTRAVENOUS | Status: AC
Start: 2017-04-06 — End: 2017-04-06
  Administered 2017-04-06: 10 mL via INTRAVENOUS
  Filled 2017-04-06: qty 10

## 2017-04-06 MED ORDER — PERFLUTREN LIPID MICROSPHERE
1.0000 mL | INTRAVENOUS | Status: AC | PRN
  Administered 2017-04-06: 2 mL via INTRAVENOUS
  Administered 2017-04-06 (×2): 1 mL via INTRAVENOUS
  Filled 2017-04-06: qty 10

## 2017-04-06 MED ORDER — TECHNETIUM TC 99M TETROFOSMIN IV KIT: 30.0000 | PACK | Freq: Once | INTRAVENOUS | Status: DC | PRN

## 2017-04-06 MED ORDER — REGADENOSON 0.4 MG/5ML IV SOLN
INTRAVENOUS | Status: AC
  Administered 2017-04-06: 0.4 mg via INTRAVENOUS
  Filled 2017-04-06: qty 5

## 2017-04-06 NOTE — Progress Notes (Signed)
*  PRELIMINARY RESULTS* Echocardiogram 2D Echocardiogram has been performed with Definity.  Bradley Torres 04/06/2017, 2:12 PM

## 2017-04-07 ENCOUNTER — Encounter (HOSPITAL_COMMUNITY)
Admission: RE | Admit: 2017-04-07 | Discharge: 2017-04-07 | Disposition: A | Discharge status: Home / Self Care | Payer: Medicaid Other | Source: Ambulatory Visit | Attending: Cardiovascular Disease | Admitting: Cardiovascular Disease

## 2017-04-07 LAB — NM MYOCAR MULTI W/SPECT W/WALL MOTION / EF
CSEPPHR: 83 {beats}/min
LV dias vol: 127 mL (ref 62–150)
LVSYSVOL: 60 mL
RATE: 0.35
Rest HR: 68 {beats}/min
SDS: 3
SRS: 1
SSS: 4
TID: 0.89

## 2017-04-07 MED ORDER — TECHNETIUM TC 99M TETROFOSMIN IV KIT
25.0000 | PACK | Freq: Once | INTRAVENOUS | Status: AC | PRN
  Administered 2017-04-07: 27 via INTRAVENOUS

## 2017-04-07 MED ORDER — TECHNETIUM TC 99M TETROFOSMIN IV KIT
30.0000 | PACK | Freq: Once | INTRAVENOUS | Status: AC | PRN
  Administered 2017-04-06: 30 via INTRAVENOUS

## 2017-04-14 ENCOUNTER — Telehealth: Payer: Self-pay | Admitting: *Deleted

## 2017-04-14 NOTE — Telephone Encounter (Signed)
Notes recorded by Laurine Blazer, LPN on 9/83/3825 at 0:53 AM EST Patient notified via detailed voice message. Copy to pmd. Follow up scheduled for 04/27/2017 with Dr. Bronson Ing in [place]. ------  Notes recorded by Laurine Blazer, LPN on 9/76/7341 at 9:37 PM EST Left message to return call.  ------  Notes recorded by Laurine Blazer, LPN on 11/28/4095 at 3:53 PM EST No answer.  ------  Notes recorded by Herminio Commons, MD on 04/07/2017 at 2:41 PM EST Low risk for blockages.

## 2017-04-27 ENCOUNTER — Ambulatory Visit (INDEPENDENT_AMBULATORY_CARE_PROVIDER_SITE_OTHER): Payer: Medicaid Other | Admitting: Cardiovascular Disease

## 2017-04-27 ENCOUNTER — Encounter: Payer: Self-pay | Admitting: Cardiovascular Disease

## 2017-04-27 VITALS — BP 138/96 | HR 82 | Ht 70.0 in | Wt >= 6400 oz

## 2017-04-27 DIAGNOSIS — I10 Essential (primary) hypertension: Secondary | ICD-10-CM | POA: Diagnosis not present

## 2017-04-27 DIAGNOSIS — R61 Generalized hyperhidrosis: Secondary | ICD-10-CM | POA: Diagnosis not present

## 2017-04-27 DIAGNOSIS — R0609 Other forms of dyspnea: Secondary | ICD-10-CM | POA: Diagnosis not present

## 2017-04-27 DIAGNOSIS — R5383 Other fatigue: Secondary | ICD-10-CM | POA: Diagnosis not present

## 2017-04-27 NOTE — Progress Notes (Signed)
SUBJECTIVE: The patient presents for follow-up after undergoing cardiac testing.  He has a history of hypertension and morbid obesity.  He had been expressing progressive exertional dyspnea.  Nuclear stress test on 04/07/17 showed a small, mild intensity, partially reversible lateral apical defect reflecting either variable soft tissue attenuation or a minor ischemic territory.  Overall it was a low risk study, LVEF 53%.  Echocardiogram demonstrated normal left ventricular systolic function, LVEF 44-01%, mild to moderate LVH, and grossly normal diastolic function.  There were no significant valvular abnormalities.  He said he feels 80% better and thinks most of his problems were related to his colon.  Shortness of breath has markedly improved.  He tells me diastolic blood pressures generally run from 79-85.  He is scheduled to see GI on February 6.   Review of Systems: As per "subjective", otherwise negative.  Allergies  Allergen Reactions  . Ibuprofen     Feels that it caused rectal bleeding  . Losartan     rash    Current Outpatient Medications  Medication Sig Dispense Refill  . bisoprolol-hydrochlorothiazide (ZIAC) 10-6.25 MG tablet TAKE ONE TABLET BY MOUTH EVERY DAY 90 tablet 0  . cyclobenzaprine (FLEXERIL) 10 MG tablet TAKE 1 TABLET BY MOUTH THREE TIMES DAILY AS NEEDED 90 tablet 0  . lisinopril-hydrochlorothiazide (PRINZIDE,ZESTORETIC) 20-25 MG tablet Take 1 tablet by mouth daily. 90 tablet 3  . oxyCODONE-acetaminophen (PERCOCET) 10-325 MG tablet Take 1 tablet by mouth 2 (two) times daily. 60 tablet 0   No current facility-administered medications for this visit.     Past Medical History:  Diagnosis Date  . Chronic back pain   . Essential hypertension   . Morbid obesity ([place])   . Panic disorder   . Prediabetes   . Shoulder pain, left    Following fall    Past Surgical History:  Procedure Laterality Date  . Ankle fracture sugery Left   . LUMBAR SPINE SURGERY        Social History   Socioeconomic History  . Marital status: Single    Spouse name: Not on file  . Number of children: Not on file  . Years of education: Not on file  . Highest education level: Not on file  Social Needs  . Financial resource strain: Not on file  . Food insecurity - worry: Not on file  . Food insecurity - inability: Not on file  . Transportation needs - medical: Not on file  . Transportation needs - non-medical: Not on file  Occupational History  . Not on file  Tobacco Use  . Smoking status: Never Smoker  . Smokeless tobacco: Never Used  Substance and Sexual Activity  . Alcohol use: No  . Drug use: Yes    Types: Marijuana  . Sexual activity: Not on file  Other Topics Concern  . Not on file  Social History Narrative  . Not on file     Vitals:   04/27/17 1123  BP: (!) 138/96  Pulse: 82  SpO2: 96%  Weight: (!) 415 lb (188.2 kg)  Height: 5\' 10"  (1.778 m)    Wt Readings from Last 3 Encounters:  04/27/17 (!) 415 lb (188.2 kg)  03/15/17 (!) 415 lb (188.2 kg)  03/14/17 (!) 412 lb 6.4 oz (187.1 kg)     PHYSICAL EXAM General: NAD, morbidly obese HEENT: Normal. Neck: Unable to assess JVP due to body habitus. Lungs: Faint scattered expiratory wheezes. CV: Regular rate and rhythm, normal S1/S2, no  S3/S4, no murmur. No pretibial or periankle edema.   Abdomen: Morbidly obese.  Neurologic: Alert and oriented.  Psych: Normal affect. Skin: Normal. Musculoskeletal: No gross deformities.    ECG: Most recent ECG reviewed.   Labs: Lab Results  Component Value Date/Time   K 4.4 12/13/2016 12:18 PM   BUN 27 (H) 12/13/2016 12:18 PM   CREATININE 1.82 (H) 12/13/2016 12:18 PM   ALT 19 12/13/2016 12:18 PM   HGB 14.6 12/13/2016 12:18 PM     Lipids: Lab Results  Component Value Date/Time   LDLCALC 118 (H) 11/15/2016 12:22 PM   CHOL 179 11/15/2016 12:22 PM   TRIG 109 11/15/2016 12:22 PM   HDL 39 (L) 11/15/2016 12:22 PM       ASSESSMENT AND  PLAN:  1.  Progressive exertional dyspnea and fatigue with diaphoresis and lightheadedness: Symptoms have markedly improved.  He likely has restrictive lung disease due to morbid obesity which could also produce the symptoms.  He did not want to undergo right and left heart catheterization and thus I ordered a stress test. Low risk nuclear stress test as detailed above.  Echocardiogram demonstrated normal left ventricular systolic and diastolic function and normal regional wall motion. No further cardiac testing is indicated.  2.  Hypertension: Diastolic blood pressure is again elevated today.  However, he said it is normally well controlled at home and other office visits.  No changes to therapy today.  3.  Morbid obesity: I would consider bariatric surgery but will defer to his PCP.     Disposition: Follow up as needed   Kate Sable, M.D., F.A.C.C.

## 2017-04-27 NOTE — Patient Instructions (Signed)
Medication Instructions:  Continue all current medications.  Labwork: none  Testing/Procedures: none  Follow-Up: As needed.    Any Other Special Instructions Will Be Listed Below (If Applicable).  If you need a refill on your cardiac medications before your next appointment, please call your pharmacy.  

## 2017-05-01 ENCOUNTER — Ambulatory Visit: Payer: Medicaid Other | Admitting: Physician Assistant

## 2017-05-01 ENCOUNTER — Encounter: Payer: Self-pay | Admitting: Physician Assistant

## 2017-05-01 VITALS — BP 133/78 | HR 68 | Temp 98.1°F | Ht 70.0 in | Wt >= 6400 oz

## 2017-05-01 DIAGNOSIS — M545 Low back pain: Secondary | ICD-10-CM

## 2017-05-01 DIAGNOSIS — R194 Change in bowel habit: Secondary | ICD-10-CM

## 2017-05-01 DIAGNOSIS — R1032 Left lower quadrant pain: Secondary | ICD-10-CM | POA: Diagnosis not present

## 2017-05-01 DIAGNOSIS — I10 Essential (primary) hypertension: Secondary | ICD-10-CM | POA: Diagnosis not present

## 2017-05-01 DIAGNOSIS — G8929 Other chronic pain: Secondary | ICD-10-CM

## 2017-05-01 MED ORDER — OXYCODONE-ACETAMINOPHEN 10-325 MG PO TABS: 1.0000 | ORAL_TABLET | Freq: Two times a day (BID) | ORAL | 0 refills | Status: DC

## 2017-05-01 MED ORDER — METRONIDAZOLE 500 MG PO TABS: 500.0000 mg | ORAL_TABLET | Freq: Three times a day (TID) | ORAL | 0 refills | Status: DC

## 2017-05-01 MED ORDER — BISOPROLOL-HYDROCHLOROTHIAZIDE 10-6.25 MG PO TABS: 1.0000 | ORAL_TABLET | Freq: Every day | ORAL | 3 refills | Status: DC

## 2017-05-01 MED ORDER — CIPROFLOXACIN HCL 500 MG PO TABS: 500.0000 mg | ORAL_TABLET | Freq: Two times a day (BID) | ORAL | 0 refills | Status: DC

## 2017-05-01 NOTE — Progress Notes (Signed)
BP 133/78   Pulse 68   Temp 98.1 F (36.7 C) (Oral)   Ht 5\' 10"  (1.778 m)   Wt (!) 412 lb 12.8 oz (187.2 kg)   BMI 59.23 kg/m    Subjective:    Patient ID: Bradley Torres, male    DOB: February 19, 1961, 57 y.o.   MRN: [id number]  HPI: Bradley Torres is a 57 y.o. male presenting on 05/01/2017 for Follow-up (3  month )  Chronic pain is under control, patient has no new complaints. Medications are keeping things stable. Needs refills for the next three months.  [place] Controlled Substance website checked and normal. Drug screen normal this year.  He is also dealing with the gastroenterologist concerning his bowel changes and left lower quadrant pain.  He had a cardiac evaluation and is cleared for a possible procedure by the neurologist.  Medications are refilled and we will see him back in 3 months.  Relevant past medical, surgical, family and social history reviewed and updated as indicated. Allergies and medications reviewed and updated.  Past Medical History:  Diagnosis Date  . Chronic back pain   . Essential hypertension   . Morbid obesity ([place])   . Panic disorder   . Prediabetes   . Shoulder pain, left    Following fall    Past Surgical History:  Procedure Laterality Date  . Ankle fracture sugery Left   . LUMBAR SPINE SURGERY      Review of Systems  Constitutional: Negative.  Negative for appetite change and fatigue.  HENT: Negative.   Eyes: Negative.  Negative for pain and visual disturbance.  Respiratory: Negative.  Negative for cough, chest tightness, shortness of breath and wheezing.   Cardiovascular: Negative.  Negative for chest pain, palpitations and leg swelling.  Gastrointestinal: Positive for abdominal pain, constipation and diarrhea. Negative for blood in stool, nausea, rectal pain and vomiting.  Endocrine: Negative.   Genitourinary: Negative.   Musculoskeletal: Negative.   Skin: Negative.  Negative for color change and rash.  Neurological: Negative.  Negative  for weakness, numbness and headaches.  Psychiatric/Behavioral: Negative.     Allergies as of 05/01/2017      Reactions   Ibuprofen    Feels that it caused rectal bleeding   Losartan    rash      Medication List        Accurate as of 05/01/17  1:46 PM. Always use your most recent med list.          bisoprolol-hydrochlorothiazide 10-6.25 MG tablet Commonly known as:  ZIAC Take 1 tablet by mouth daily.   ciprofloxacin 500 MG tablet Commonly known as:  CIPRO Take 1 tablet (500 mg total) by mouth 2 (two) times daily.   cyclobenzaprine 10 MG tablet Commonly known as:  FLEXERIL TAKE 1 TABLET BY MOUTH THREE TIMES DAILY AS NEEDED   lisinopril-hydrochlorothiazide 20-25 MG tablet Commonly known as:  PRINZIDE,ZESTORETIC Take 1 tablet by mouth daily.   metroNIDAZOLE 500 MG tablet Commonly known as:  FLAGYL Take 1 tablet (500 mg total) by mouth 3 (three) times daily.   oxyCODONE-acetaminophen 10-325 MG tablet Commonly known as:  PERCOCET Take 1 tablet by mouth 2 (two) times daily.   oxyCODONE-acetaminophen 10-325 MG tablet Commonly known as:  PERCOCET Take 1 tablet by mouth 2 (two) times daily.   oxyCODONE-acetaminophen 10-325 MG tablet Commonly known as:  PERCOCET Take 1 tablet by mouth 2 (two) times daily.          Objective:  BP 133/78   Pulse 68   Temp 98.1 F (36.7 C) (Oral)   Ht 5\' 10"  (1.778 m)   Wt (!) 412 lb 12.8 oz (187.2 kg)   BMI 59.23 kg/m   Allergies  Allergen Reactions  . Ibuprofen     Feels that it caused rectal bleeding  . Losartan     rash    Physical Exam  Constitutional: He appears well-developed and well-nourished. No distress.  HENT:  Head: Normocephalic and atraumatic.  Eyes: Conjunctivae and EOM are normal. Pupils are equal, round, and reactive to light.  Cardiovascular: Normal rate, regular rhythm and normal heart sounds.  Pulmonary/Chest: Effort normal and breath sounds normal. No respiratory distress.  Abdominal: Bowel sounds  are normal. There is tenderness.  Skin: Skin is warm and dry.  Psychiatric: He has a normal mood and affect. His behavior is normal.  Nursing note and vitals reviewed.       Assessment & Plan:   1. Essential hypertension - bisoprolol-hydrochlorothiazide (ZIAC) 10-6.25 MG tablet; Take 1 tablet by mouth daily.  Dispense: 90 tablet; Refill: 3  2. Chronic low back pain, unspecified back pain laterality, with sciatica presence unspecified - oxyCODONE-acetaminophen (PERCOCET) 10-325 MG tablet; Take 1 tablet by mouth 2 (two) times daily.  Dispense: 60 tablet; Refill: 0  3. LLQ pain  4. Bowel habit changes    Current Outpatient Medications:  .  bisoprolol-hydrochlorothiazide (ZIAC) 10-6.25 MG tablet, Take 1 tablet by mouth daily., Disp: 90 tablet, Rfl: 3 .  cyclobenzaprine (FLEXERIL) 10 MG tablet, TAKE 1 TABLET BY MOUTH THREE TIMES DAILY AS NEEDED, Disp: 90 tablet, Rfl: 0 .  lisinopril-hydrochlorothiazide (PRINZIDE,ZESTORETIC) 20-25 MG tablet, Take 1 tablet by mouth daily., Disp: 90 tablet, Rfl: 3 .  oxyCODONE-acetaminophen (PERCOCET) 10-325 MG tablet, Take 1 tablet by mouth 2 (two) times daily., Disp: 60 tablet, Rfl: 0 .  ciprofloxacin (CIPRO) 500 MG tablet, Take 1 tablet (500 mg total) by mouth 2 (two) times daily., Disp: 20 tablet, Rfl: 0 .  metroNIDAZOLE (FLAGYL) 500 MG tablet, Take 1 tablet (500 mg total) by mouth 3 (three) times daily., Disp: 30 tablet, Rfl: 0 .  oxyCODONE-acetaminophen (PERCOCET) 10-325 MG tablet, Take 1 tablet by mouth 2 (two) times daily., Disp: 60 tablet, Rfl: 0 .  oxyCODONE-acetaminophen (PERCOCET) 10-325 MG tablet, Take 1 tablet by mouth 2 (two) times daily., Disp: 60 tablet, Rfl: 0 Continue all other maintenance medications as listed above.  Follow up plan: Return in about 3 months (around 07/29/2017) for recheck.  Educational handout given for [place] PA-C [facility] [address]  [place], [place]  [address] [phone]   05/01/2017, 1:46 PM

## 2017-05-01 NOTE — Patient Instructions (Signed)
In a few days you may receive a survey in the mail or online from [employer] regarding your visit with us today. Please take a moment to fill this out. Your feedback is very important to our whole office. It can help us better understand your needs as well as improve your experience and satisfaction. Thank you for taking your time to complete it. We care about you.  Ein Rijo, PA-C  

## 2017-05-03 ENCOUNTER — Telehealth: Payer: Self-pay | Admitting: *Deleted

## 2017-05-03 ENCOUNTER — Encounter: Payer: Self-pay | Admitting: *Deleted

## 2017-05-03 ENCOUNTER — Other Ambulatory Visit: Payer: Self-pay | Admitting: *Deleted

## 2017-05-03 ENCOUNTER — Encounter: Payer: Self-pay | Admitting: Gastroenterology

## 2017-05-03 ENCOUNTER — Ambulatory Visit: Payer: Medicaid Other | Admitting: Gastroenterology

## 2017-05-03 VITALS — BP 143/82 | HR 76 | Temp 97.8°F | Ht 72.0 in | Wt >= 6400 oz

## 2017-05-03 DIAGNOSIS — Z800 Family history of malignant neoplasm of digestive organs: Secondary | ICD-10-CM

## 2017-05-03 DIAGNOSIS — R194 Change in bowel habit: Secondary | ICD-10-CM | POA: Diagnosis not present

## 2017-05-03 HISTORY — DX: Family history of malignant neoplasm of digestive organs: Z80.0

## 2017-05-03 MED ORDER — CLENPIQ 10-3.5-12 MG-GM -GM/160ML PO SOLN: 1.0000 | Freq: Once | ORAL | 0 refills | Status: AC

## 2017-05-03 NOTE — Assessment & Plan Note (Addendum)
57 year old gentleman presenting with vague change in bowel habits abdominal discomfort, need for first ever colonoscopy. Mother succumbed to colon cancer in her early 40s.  Late 89s.  Patient has similar symptoms back in December, treated with a course of Cipro and Flagyl with good results.  Did well up until this past weekend when he developed vague change in bowel habits, solid now to loose stool but only 1 daily.  Some vague abdominal "discomfort" but no pain.  Pain is in the upper abdomen.  Unlikely diverticulitis.  Differential diagnosis includes colitis, small bowel bacterial overgrowth given improvement with antibiotic therapy.  Cannot exclude malignancy.  Recommend colonoscopy with deep sedation given chronic narcotics.   I have discussed the risks, alternatives, benefits with regards to but not limited to the risk of reaction to medication, bleeding, infection, perforation and the patient is agreeable to proceed. Written consent to be obtained.  He is insistent that antibiotic therapy helped him for therefore would advise for him to go ahead and complete this course as well.  If he does not have significant improvement or resolution of his symptoms he will let me know.  Plan for colonoscopy in about 3-4 weeks.

## 2017-05-03 NOTE — Patient Instructions (Signed)
1. Take antibiotics provided by PCP and if your stools and abdominal discomfort do not improve, please let me know.  2. Colonoscopy as scheduled. See separate instructions.

## 2017-05-03 NOTE — Progress Notes (Signed)
CC'D TO PCP °

## 2017-05-03 NOTE — H&P (View-Only) (Signed)
Primary Care Physician:  Terald Sleeper, PA-C  Primary Gastroenterologist:  Garfield Cornea, MD   Chief Complaint  Patient presents with  . Colonoscopy    cleared from cards per pt  . Diarrhea    "mud like"  . Abdominal Pain    more discomfort all over at times    HPI:  Bradley Torres is a 57 y.o. male here for follow-up to schedule colonoscopy.  Initially seen back in December when he presented with left flank pain radiating into the right abdomen, "inflamed colon".  Stools runny.  Had been given Cipro and I added Flagyl for GI coverage.  Patient never had a colonoscopy.  No prior CT imaging of the abdomen.  He was treated empirically for possible colitis versus diverticulitis.  He states while on antibiotics his symptoms resolved and he felt really well up until this past weekend.  He denies specific abdominal pain.  Pretty vague about his description but states he feels uncomfortable in the upper abdomen.  Feels like his rectum is "swollen".  Harder to pass gas than usual.  Stools went from 1-2 formed stools daily to 1 stool daily, mostly soft to loose.  No melena or rectal bleeding.  Denies fever.  Appetite is been great.  No nausea or vomiting.  No heartburn, dysphagia.  Saw his PCP a couple of days ago who prescribed Cipro and Flagyl again.  He was going to pick it up today at the pharmacy but wanted to discuss with [place] first.  He has been cleared by cardiology to undergo colonoscopy.  No prior colonoscopy. Family history significant for colon cancer, parent before age of 60. Sister died due to autoimmune hepatitis at age 58.   Current Outpatient Medications  Medication Sig Dispense Refill  . bisoprolol-hydrochlorothiazide (ZIAC) 10-6.25 MG tablet Take 1 tablet by mouth daily. 90 tablet 3  . ciprofloxacin (CIPRO) 500 MG tablet Take 1 tablet (500 mg total) by mouth 2 (two) times daily. 20 tablet 0  . cyclobenzaprine (FLEXERIL) 10 MG tablet TAKE 1 TABLET BY MOUTH THREE TIMES DAILY AS  NEEDED 90 tablet 0  . lisinopril-hydrochlorothiazide (PRINZIDE,ZESTORETIC) 20-25 MG tablet Take 1 tablet by mouth daily. 90 tablet 3  . metroNIDAZOLE (FLAGYL) 500 MG tablet Take 1 tablet (500 mg total) by mouth 3 (three) times daily. 30 tablet 0  . oxyCODONE-acetaminophen (PERCOCET) 10-325 MG tablet Take 1 tablet by mouth 2 (two) times daily. 60 tablet 0   No current facility-administered medications for this visit.     Allergies as of 05/03/2017 - Review Complete 05/03/2017  Allergen Reaction Noted  . Ibuprofen  11/20/2013  . Losartan  03/17/2016    Past Medical History:  Diagnosis Date  . Chronic back pain   . Essential hypertension   . Morbid obesity ([place])   . Panic disorder   . Prediabetes   . Shoulder pain, left    Following fall    Past Surgical History:  Procedure Laterality Date  . Ankle fracture sugery Left   . LUMBAR SPINE SURGERY      Family History  Problem Relation Age of Onset  . Colon cancer Mother        died at age 61  . Heart attack Father   . Hepatitis Sister 42       Autoimmune  . Heart attack Paternal Grandfather     Social History   Socioeconomic History  . Marital status: Single    Spouse name: Not on file  .  Number of children: Not on file  . Years of education: Not on file  . Highest education level: Not on file  Social Needs  . Financial resource strain: Not on file  . Food insecurity - worry: Not on file  . Food insecurity - inability: Not on file  . Transportation needs - medical: Not on file  . Transportation needs - non-medical: Not on file  Occupational History  . Not on file  Tobacco Use  . Smoking status: Never Smoker  . Smokeless tobacco: Never Used  Substance and Sexual Activity  . Alcohol use: No  . Drug use: Yes    Types: Marijuana  . Sexual activity: Not on file  Other Topics Concern  . Not on file  Social History Narrative  . Not on file      ROS:  General: Negative for anorexia, weight loss, fever,  chills, fatigue, weakness. Eyes: Negative for vision changes.  ENT: Negative for hoarseness, difficulty swallowing , nasal congestion. CV: Negative for chest pain, angina, palpitations, dyspnea on exertion, peripheral edema.  Respiratory: Negative for dyspnea at rest, dyspnea on exertion, cough, sputum, wheezing.  GI: See history of present illness. GU:  Negative for dysuria, hematuria, urinary incontinence, urinary frequency, nocturnal urination.  MS: Negative for joint pain, low back pain.  Derm: Negative for rash or itching.  Neuro: Negative for weakness, abnormal sensation, seizure, frequent headaches, memory loss, confusion.  Psych: Negative for anxiety, depression, suicidal ideation, hallucinations.  Endo: Negative for unusual weight change.  Heme: Negative for bruising or bleeding. Allergy: Negative for rash or hives.    Physical Examination:  BP (!) 143/82   Pulse 76   Temp 97.8 F (36.6 C) (Oral)   Ht 6' (1.829 m)   Wt (!) 414 lb 6.4 oz (188 kg)   BMI 56.20 kg/m    General: Well-nourished, well-developed in no acute distress.  Head: Normocephalic, atraumatic.   Eyes: Conjunctiva pink, no icterus. Mouth: Oropharyngeal mucosa moist and pink , no lesions erythema or exudate. Neck: Supple without thyromegaly, masses, or lymphadenopathy.  Lungs: Clear to auscultation bilaterally.  Heart: Regular rate and rhythm, no murmurs rubs or gallops.  Abdomen: Bowel sounds are normal, nontender, morbidly obese, exam limited due to body habitus patient also refused to get on the exam table  rectal: Not performed Extremities: No lower extremity edema. No clubbing or deformities.  Neuro: Alert and oriented x 4 , grossly normal neurologically.  Skin: Warm and dry, no rash or jaundice.   Psych: Alert and cooperative, normal mood and affect.  Labs: Lab Results  Component Value Date   ALT 19 12/13/2016   AST 19 12/13/2016   ALKPHOS 73 12/13/2016   BILITOT 0.6 12/13/2016   Lab  Results  Component Value Date   CREATININE 1.82 (H) 12/13/2016   BUN 27 (H) 12/13/2016   NA 140 12/13/2016   K 4.4 12/13/2016   CL 98 12/13/2016   CO2 26 12/13/2016   Lab Results  Component Value Date   WBC 11.1 (H) 12/13/2016   HGB 14.6 12/13/2016   HCT 44.0 12/13/2016   MCV 100 (H) 12/13/2016   PLT 313 12/13/2016     Imaging Studies: Nm Myocar Multi W/spect W/wall Motion / Ef  Result Date: 04/07/2017  No diagnostic ST segment changes to indicate ischemia.  Small, mild intensity, mid to basal inferior defect that is most consistent with diaphragmatic attenuation.  Small, mild intensity, partially reversible lateral apical defect reflecting either variable soft tissue attenuation  or a minor ischemic territory.  This is a low risk study.  Nuclear stress EF: 53%.

## 2017-05-03 NOTE — Telephone Encounter (Signed)
Pre-op scheduled for 05/22/17 at 12:45pm. Pt aware. Letter mailed.

## 2017-05-03 NOTE — Progress Notes (Signed)
Primary Care Physician:  Terald Sleeper, PA-C  Primary Gastroenterologist:  Garfield Cornea, MD   Chief Complaint  Patient presents with  . Colonoscopy    cleared from cards per pt  . Diarrhea    "mud like"  . Abdominal Pain    more discomfort all over at times    HPI:  Bradley Torres is a 57 y.o. male here for follow-up to schedule colonoscopy.  Initially seen back in December when he presented with left flank pain radiating into the right abdomen, "inflamed colon".  Stools runny.  Had been given Cipro and I added Flagyl for GI coverage.  Patient never had a colonoscopy.  No prior CT imaging of the abdomen.  He was treated empirically for possible colitis versus diverticulitis.  He states while on antibiotics his symptoms resolved and he felt really well up until this past weekend.  He denies specific abdominal pain.  Pretty vague about his description but states he feels uncomfortable in the upper abdomen.  Feels like his rectum is "swollen".  Harder to pass gas than usual.  Stools went from 1-2 formed stools daily to 1 stool daily, mostly soft to loose.  No melena or rectal bleeding.  Denies fever.  Appetite is been great.  No nausea or vomiting.  No heartburn, dysphagia.  Saw his PCP a couple of days ago who prescribed Cipro and Flagyl again.  He was going to pick it up today at the pharmacy but wanted to discuss with [place] first.  He has been cleared by cardiology to undergo colonoscopy.  No prior colonoscopy. Family history significant for colon cancer, parent before age of 77. Sister died due to autoimmune hepatitis at age 21.   Current Outpatient Medications  Medication Sig Dispense Refill  . bisoprolol-hydrochlorothiazide (ZIAC) 10-6.25 MG tablet Take 1 tablet by mouth daily. 90 tablet 3  . ciprofloxacin (CIPRO) 500 MG tablet Take 1 tablet (500 mg total) by mouth 2 (two) times daily. 20 tablet 0  . cyclobenzaprine (FLEXERIL) 10 MG tablet TAKE 1 TABLET BY MOUTH THREE TIMES DAILY AS  NEEDED 90 tablet 0  . lisinopril-hydrochlorothiazide (PRINZIDE,ZESTORETIC) 20-25 MG tablet Take 1 tablet by mouth daily. 90 tablet 3  . metroNIDAZOLE (FLAGYL) 500 MG tablet Take 1 tablet (500 mg total) by mouth 3 (three) times daily. 30 tablet 0  . oxyCODONE-acetaminophen (PERCOCET) 10-325 MG tablet Take 1 tablet by mouth 2 (two) times daily. 60 tablet 0   No current facility-administered medications for this visit.     Allergies as of 05/03/2017 - Review Complete 05/03/2017  Allergen Reaction Noted  . Ibuprofen  11/20/2013  . Losartan  03/17/2016    Past Medical History:  Diagnosis Date  . Chronic back pain   . Essential hypertension   . Morbid obesity ([place])   . Panic disorder   . Prediabetes   . Shoulder pain, left    Following fall    Past Surgical History:  Procedure Laterality Date  . Ankle fracture sugery Left   . LUMBAR SPINE SURGERY      Family History  Problem Relation Age of Onset  . Colon cancer Mother        died at age 88  . Heart attack Father   . Hepatitis Sister 42       Autoimmune  . Heart attack Paternal Grandfather     Social History   Socioeconomic History  . Marital status: Single    Spouse name: Not on file  .  Number of children: Not on file  . Years of education: Not on file  . Highest education level: Not on file  Social Needs  . Financial resource strain: Not on file  . Food insecurity - worry: Not on file  . Food insecurity - inability: Not on file  . Transportation needs - medical: Not on file  . Transportation needs - non-medical: Not on file  Occupational History  . Not on file  Tobacco Use  . Smoking status: Never Smoker  . Smokeless tobacco: Never Used  Substance and Sexual Activity  . Alcohol use: No  . Drug use: Yes    Types: Marijuana  . Sexual activity: Not on file  Other Topics Concern  . Not on file  Social History Narrative  . Not on file      ROS:  General: Negative for anorexia, weight loss, fever,  chills, fatigue, weakness. Eyes: Negative for vision changes.  ENT: Negative for hoarseness, difficulty swallowing , nasal congestion. CV: Negative for chest pain, angina, palpitations, dyspnea on exertion, peripheral edema.  Respiratory: Negative for dyspnea at rest, dyspnea on exertion, cough, sputum, wheezing.  GI: See history of present illness. GU:  Negative for dysuria, hematuria, urinary incontinence, urinary frequency, nocturnal urination.  MS: Negative for joint pain, low back pain.  Derm: Negative for rash or itching.  Neuro: Negative for weakness, abnormal sensation, seizure, frequent headaches, memory loss, confusion.  Psych: Negative for anxiety, depression, suicidal ideation, hallucinations.  Endo: Negative for unusual weight change.  Heme: Negative for bruising or bleeding. Allergy: Negative for rash or hives.    Physical Examination:  BP (!) 143/82   Pulse 76   Temp 97.8 F (36.6 C) (Oral)   Ht 6' (1.829 m)   Wt (!) 414 lb 6.4 oz (188 kg)   BMI 56.20 kg/m    General: Well-nourished, well-developed in no acute distress.  Head: Normocephalic, atraumatic.   Eyes: Conjunctiva pink, no icterus. Mouth: Oropharyngeal mucosa moist and pink , no lesions erythema or exudate. Neck: Supple without thyromegaly, masses, or lymphadenopathy.  Lungs: Clear to auscultation bilaterally.  Heart: Regular rate and rhythm, no murmurs rubs or gallops.  Abdomen: Bowel sounds are normal, nontender, morbidly obese, exam limited due to body habitus patient also refused to get on the exam table  rectal: Not performed Extremities: No lower extremity edema. No clubbing or deformities.  Neuro: Alert and oriented x 4 , grossly normal neurologically.  Skin: Warm and dry, no rash or jaundice.   Psych: Alert and cooperative, normal mood and affect.  Labs: Lab Results  Component Value Date   ALT 19 12/13/2016   AST 19 12/13/2016   ALKPHOS 73 12/13/2016   BILITOT 0.6 12/13/2016   Lab  Results  Component Value Date   CREATININE 1.82 (H) 12/13/2016   BUN 27 (H) 12/13/2016   NA 140 12/13/2016   K 4.4 12/13/2016   CL 98 12/13/2016   CO2 26 12/13/2016   Lab Results  Component Value Date   WBC 11.1 (H) 12/13/2016   HGB 14.6 12/13/2016   HCT 44.0 12/13/2016   MCV 100 (H) 12/13/2016   PLT 313 12/13/2016     Imaging Studies: Nm Myocar Multi W/spect W/wall Motion / Ef  Result Date: 04/07/2017  No diagnostic ST segment changes to indicate ischemia.  Small, mild intensity, mid to basal inferior defect that is most consistent with diaphragmatic attenuation.  Small, mild intensity, partially reversible lateral apical defect reflecting either variable soft tissue attenuation  or a minor ischemic territory.  This is a low risk study.  Nuclear stress EF: 53%.

## 2017-05-05 ENCOUNTER — Ambulatory Visit: Payer: Medicaid Other | Admitting: Gastroenterology

## 2017-05-18 NOTE — Patient Instructions (Signed)
Bradley Torres  05/18/2017     @PREFPERIOPPHARMACY @   Your procedure is scheduled on  05/29/2017   Report to [facility] at  1100  A.M.  Call this number if you have problems the morning of surgery:  [phone]   Remember:  Do not eat food or drink liquids after midnight.  Take these medicines the morning of surgery with A SIP OF WATER  Bisoprolol, flexaril, lisinopril, oxycodone.   Do not wear jewelry, make-up or nail polish.  Do not wear lotions, powders, or perfumes, or deodorant.  Do not shave 48 hours prior to surgery.  Men may shave face and neck.  Do not bring valuables to the hospital.  [facility] is not responsible for any belongings or valuables.  Contacts, dentures or bridgework may not be worn into surgery.  Leave your suitcase in the car.  After surgery it may be brought to your room.  For patients admitted to the hospital, discharge time will be determined by your treatment team.  Patients discharged the day of surgery will not be allowed to drive home.   Name and phone number of your driver:   family Special instructions:  Follow the diet and prep instructions given to you by Dr Roseanne Kaufman office.  Please read over the following fact sheets that you were given. Anesthesia Post-op Instructions and Care and Recovery After Surgery       Colonoscopy, Adult A colonoscopy is an exam to look at the large intestine. It is done to check for problems, such as:  Lumps (tumors).  Growths (polyps).  Swelling (inflammation).  Bleeding.  What happens before the procedure? Eating and drinking Follow instructions from your doctor about eating and drinking. These instructions may include:  A few days before the procedure - follow a low-fiber diet. ? Avoid nuts. ? Avoid seeds. ? Avoid dried fruit. ? Avoid raw fruits. ? Avoid vegetables.  1-3 days before the procedure - follow a clear liquid diet. Avoid liquids that have red or purple dye. Drink  only clear liquids, such as: ? Clear broth or bouillon. ? Black coffee or tea. ? Clear juice. ? Clear soft drinks or sports drinks. ? Gelatin dessert. ? Popsicles.  On the day of the procedure - do not eat or drink anything during the 2 hours before the procedure.  Bowel prep If you were prescribed an oral bowel prep:  Take it as told by your doctor. Starting the day before your procedure, you will need to drink a lot of liquid. The liquid will cause you to poop (have bowel movements) until your poop is almost clear or light green.  If your skin or butt gets irritated from diarrhea, you may: ? Wipe the area with wipes that have medicine in them, such as adult wet wipes with aloe and vitamin E. ? Put something on your skin that soothes the area, such as petroleum jelly.  If you throw up (vomit) while drinking the bowel prep, take a break for up to 60 minutes. Then begin the bowel prep again. If you keep throwing up and you cannot take the bowel prep without throwing up, call your doctor.  General instructions  Ask your doctor about changing or stopping your normal medicines. This is important if you take diabetes medicines or blood thinners.  Plan to have someone take you home from the hospital or clinic. What happens during the procedure?  An IV tube  may be put into one of your veins.  You will be given medicine to help you relax (sedative).  To reduce your risk of infection: ? Your doctors will wash their hands. ? Your anal area will be washed with soap.  You will be asked to lie on your side with your knees bent.  Your doctor will get a long, thin, flexible tube ready. The tube will have a camera and a light on the end.  The tube will be put into your anus.  The tube will be gently put into your large intestine.  Air will be delivered into your large intestine to keep it open. You may feel some pressure or cramping.  The camera will be used to take photos.  A small  tissue sample may be removed from your body to be looked at under a microscope (biopsy). If any possible problems are found, the tissue will be sent to a lab for testing.  If small growths are found, your doctor may remove them and have them checked for cancer.  The tube that was put into your anus will be slowly removed. The procedure may vary among doctors and hospitals. What happens after the procedure?  Your doctor will check on you often until the medicines you were given have worn off.  Do not drive for 24 hours after the procedure.  You may have a small amount of blood in your poop.  You may pass gas.  You may have mild cramps or bloating in your belly (abdomen).  It is up to you to get the results of your procedure. Ask your doctor, or the department performing the procedure, when your results will be ready. This information is not intended to replace advice given to you by your health care provider. Make sure you discuss any questions you have with your health care provider. Document Released: 04/16/2010 Document Revised: 01/13/2016 Document Reviewed: 05/26/2015 Elsevier Interactive Patient Education  2017 [employer]  Colonoscopy, Adult, Care After This sheet gives you information about how to care for yourself after your procedure. Your health care provider may also give you more specific instructions. If you have problems or questions, contact your health care provider. What can I expect after the procedure? After the procedure, it is common to have:  A small amount of blood in your stool for 24 hours after the procedure.  Some gas.  Mild abdominal cramping or bloating.  Follow these instructions at home: General instructions   For the first 24 hours after the procedure: ? Do not drive or use machinery. ? Do not sign important documents. ? Do not drink alcohol. ? Do your regular daily activities at a slower pace than normal. ? Eat soft, easy-to-digest  foods. ? Rest often.  Take over-the-counter or prescription medicines only as told by your health care provider.  It is up to you to get the results of your procedure. Ask your health care provider, or the department performing the procedure, when your results will be ready. Relieving cramping and bloating  Try walking around when you have cramps or feel bloated.  Apply heat to your abdomen as told by your health care provider. Use a heat source that your health care provider recommends, such as a moist heat pack or a heating pad. ? Place a towel between your skin and the heat source. ? Leave the heat on for 20-30 minutes. ? Remove the heat if your skin turns bright red. This is especially important if  you are unable to feel pain, heat, or cold. You may have a greater risk of getting burned. Eating and drinking  Drink enough fluid to keep your urine clear or pale yellow.  Resume your normal diet as instructed by your health care provider. Avoid heavy or fried foods that are hard to digest.  Avoid drinking alcohol for as long as instructed by your health care provider. Contact a health care provider if:  You have blood in your stool 2-3 days after the procedure. Get help right away if:  You have more than a small spotting of blood in your stool.  You pass large blood clots in your stool.  Your abdomen is swollen.  You have nausea or vomiting.  You have a fever.  You have increasing abdominal pain that is not relieved with medicine. This information is not intended to replace advice given to you by your health care provider. Make sure you discuss any questions you have with your health care provider. Document Released: 10/27/2003 Document Revised: 12/07/2015 Document Reviewed: 05/26/2015 Elsevier Interactive Patient Education  2018 [place] Anesthesia is a term that refers to techniques, procedures, and medicines that help a person stay safe  and comfortable during a medical procedure. Monitored anesthesia care, or sedation, is one type of anesthesia. Your anesthesia specialist may recommend sedation if you will be having a procedure that does not require you to be unconscious, such as:  Cataract surgery.  A dental procedure.  A biopsy.  A colonoscopy.  During the procedure, you may receive a medicine to help you relax (sedative). There are three levels of sedation:  Mild sedation. At this level, you may feel awake and relaxed. You will be able to follow directions.  Moderate sedation. At this level, you will be sleepy. You may not remember the procedure.  Deep sedation. At this level, you will be asleep. You will not remember the procedure.  The more medicine you are given, the deeper your level of sedation will be. Depending on how you respond to the procedure, the anesthesia specialist may change your level of sedation or the type of anesthesia to fit your needs. An anesthesia specialist will monitor you closely during the procedure. Let your health care provider know about:  Any allergies you have.  All medicines you are taking, including vitamins, herbs, eye drops, creams, and over-the-counter medicines.  Any use of steroids (by mouth or as a cream).  Any problems you or family members have had with sedatives and anesthetic medicines.  Any blood disorders you have.  Any surgeries you have had.  Any medical conditions you have, such as sleep apnea.  Whether you are pregnant or may be pregnant.  Any use of cigarettes, alcohol, or street drugs. What are the risks? Generally, this is a safe procedure. However, problems may occur, including:  Getting too much medicine (oversedation).  Nausea.  Allergic reaction to medicines.  Trouble breathing. If this happens, a breathing tube may be used to help with breathing. It will be removed when you are awake and breathing on your own.  Heart trouble.  Lung  trouble.  Before the procedure Staying hydrated Follow instructions from your health care provider about hydration, which may include:  Up to 2 hours before the procedure - you may continue to drink clear liquids, such as water, clear fruit juice, black coffee, and plain tea.  Eating and drinking restrictions Follow instructions from your health care provider about eating and  drinking, which may include:  8 hours before the procedure - stop eating heavy meals or foods such as meat, fried foods, or fatty foods.  6 hours before the procedure - stop eating light meals or foods, such as toast or cereal.  6 hours before the procedure - stop drinking milk or drinks that contain milk.  2 hours before the procedure - stop drinking clear liquids.  Medicines Ask your health care provider about:  Changing or stopping your regular medicines. This is especially important if you are taking diabetes medicines or blood thinners.  Taking medicines such as aspirin and ibuprofen. These medicines can thin your blood. Do not take these medicines before your procedure if your health care provider instructs you not to.  Tests and exams  You will have a physical exam.  You may have blood tests done to show: ? How well your kidneys and liver are working. ? How well your blood can clot.  General instructions  Plan to have someone take you home from the hospital or clinic.  If you will be going home right after the procedure, plan to have someone with you for 24 hours.  What happens during the procedure?  Your blood pressure, heart rate, breathing, level of pain and overall condition will be monitored.  An IV tube will be inserted into one of your veins.  Your anesthesia specialist will give you medicines as needed to keep you comfortable during the procedure. This may mean changing the level of sedation.  The procedure will be performed. After the procedure  Your blood pressure, heart rate,  breathing rate, and blood oxygen level will be monitored until the medicines you were given have worn off.  Do not drive for 24 hours if you received a sedative.  You may: ? Feel sleepy, clumsy, or nauseous. ? Feel forgetful about what happened after the procedure. ? Have a sore throat if you had a breathing tube during the procedure. ? Vomit. This information is not intended to replace advice given to you by your health care provider. Make sure you discuss any questions you have with your health care provider. Document Released: 12/08/2004 Document Revised: 08/21/2015 Document Reviewed: 07/05/2015 Elsevier Interactive Patient Education  2018 [place], Care After These instructions provide you with information about caring for yourself after your procedure. Your health care provider may also give you more specific instructions. Your treatment has been planned according to current medical practices, but problems sometimes occur. Call your health care provider if you have any problems or questions after your procedure. What can I expect after the procedure? After your procedure, it is common to:  Feel sleepy for several hours.  Feel clumsy and have poor balance for several hours.  Feel forgetful about what happened after the procedure.  Have poor judgment for several hours.  Feel nauseous or vomit.  Have a sore throat if you had a breathing tube during the procedure.  Follow these instructions at home: For at least 24 hours after the procedure:   Do not: ? Participate in activities in which you could fall or become injured. ? Drive. ? Use heavy machinery. ? Drink alcohol. ? Take sleeping pills or medicines that cause drowsiness. ? Make important decisions or sign legal documents. ? Take care of children on your own.  Rest. Eating and drinking  Follow the diet that is recommended by your health care provider.  If you vomit, drink water,  juice, or soup when  you can drink without vomiting.  Make sure you have little or no nausea before eating solid foods. General instructions  Have a responsible adult stay with you until you are awake and alert.  Take over-the-counter and prescription medicines only as told by your health care provider.  If you smoke, do not smoke without supervision.  Keep all follow-up visits as told by your health care provider. This is important. Contact a health care provider if:  You keep feeling nauseous or you keep vomiting.  You feel light-headed.  You develop a rash.  You have a fever. Get help right away if:  You have trouble breathing. This information is not intended to replace advice given to you by your health care provider. Make sure you discuss any questions you have with your health care provider. Document Released: 07/05/2015 Document Revised: 11/04/2015 Document Reviewed: 07/05/2015 Elsevier Interactive Patient Education  [employer].

## 2017-05-22 ENCOUNTER — Encounter (HOSPITAL_COMMUNITY)
Admission: RE | Admit: 2017-05-22 | Discharge: 2017-05-22 | Disposition: A | Discharge status: Home / Self Care | Payer: Medicaid Other | Source: Ambulatory Visit | Attending: Internal Medicine | Admitting: Internal Medicine

## 2017-05-22 ENCOUNTER — Encounter (HOSPITAL_COMMUNITY): Payer: Self-pay

## 2017-05-22 ENCOUNTER — Other Ambulatory Visit: Payer: Self-pay

## 2017-05-22 DIAGNOSIS — Z01812 Encounter for preprocedural laboratory examination: Secondary | ICD-10-CM | POA: Diagnosis present

## 2017-05-22 LAB — BASIC METABOLIC PANEL
ANION GAP: 13 (ref 5–15)
BUN: 24 mg/dL — ABNORMAL HIGH (ref 6–20)
CALCIUM: 9.2 mg/dL (ref 8.9–10.3)
CHLORIDE: 100 mmol/L — AB (ref 101–111)
CO2: 24 mmol/L (ref 22–32)
CREATININE: 1.37 mg/dL — AB (ref 0.61–1.24)
GFR calc non Af Amer: 57 mL/min — ABNORMAL LOW (ref >60)
GLUCOSE: 168 mg/dL — AB (ref 65–99)
Potassium: 3.5 mmol/L (ref 3.5–5.1)
Sodium: 137 mmol/L (ref 135–145)

## 2017-05-22 LAB — CBC WITH DIFFERENTIAL/PLATELET
Basophils Absolute: 0.1 10*3/uL (ref 0.0–0.1)
Basophils Relative: 1 %
Eosinophils Absolute: 0.3 10*3/uL (ref 0.0–0.7)
Eosinophils Relative: 3 %
HEMATOCRIT: 44.4 % (ref 39.0–52.0)
HEMOGLOBIN: 14.7 g/dL (ref 13.0–17.0)
LYMPHS PCT: 14 %
Lymphs Abs: 1.1 10*3/uL (ref 0.7–4.0)
MCH: 32.4 pg (ref 26.0–34.0)
MCHC: 33.1 g/dL (ref 30.0–36.0)
MCV: 97.8 fL (ref 78.0–100.0)
MONO ABS: 0.7 10*3/uL (ref 0.1–1.0)
MONOS PCT: 8 %
NEUTROS ABS: 5.8 10*3/uL (ref 1.7–7.7)
NEUTROS PCT: 74 %
Platelets: 285 10*3/uL (ref 150–400)
RBC: 4.54 MIL/uL (ref 4.22–5.81)
RDW: 12.7 % (ref 11.5–15.5)
WBC: 7.9 10*3/uL (ref 4.0–10.5)

## 2017-05-22 NOTE — Progress Notes (Signed)
   05/22/17 1255  OBSTRUCTIVE SLEEP APNEA  Have you ever been diagnosed with sleep apnea through a sleep study? No  Do you snore loudly (loud enough to be heard through closed doors)?  1  Do you often feel tired, fatigued, or sleepy during the daytime (such as falling asleep during driving or talking to someone)? 1  Has anyone observed you stop breathing during your sleep? 1  Do you have, or are you being treated for high blood pressure? 1  BMI more than 35 kg/m2? 1  Age > 50 (1-yes) 1  Neck circumference greater than:Male 16 inches or larger, Male 17inches or larger? 1  Male Gender (Yes=1) 1  Obstructive Sleep Apnea Score 8  Score 5 or greater  Results sent to PCP

## 2017-05-29 ENCOUNTER — Encounter (HOSPITAL_COMMUNITY)
Admission: RE | Disposition: A | Discharge status: Home / Self Care | Payer: Self-pay | Source: Ambulatory Visit | Attending: Internal Medicine

## 2017-05-29 ENCOUNTER — Encounter (HOSPITAL_COMMUNITY): Payer: Self-pay | Admitting: Emergency Medicine

## 2017-05-29 ENCOUNTER — Ambulatory Visit (HOSPITAL_COMMUNITY): Payer: Medicaid Other | Admitting: Anesthesiology

## 2017-05-29 ENCOUNTER — Ambulatory Visit (HOSPITAL_COMMUNITY)
Admission: RE | Admit: 2017-05-29 | Discharge: 2017-05-29 | Disposition: A | Discharge status: Home / Self Care | Payer: Medicaid Other | Source: Ambulatory Visit | Attending: Internal Medicine | Admitting: Internal Medicine

## 2017-05-29 DIAGNOSIS — D123 Benign neoplasm of transverse colon: Secondary | ICD-10-CM | POA: Insufficient documentation

## 2017-05-29 DIAGNOSIS — D124 Benign neoplasm of descending colon: Secondary | ICD-10-CM | POA: Diagnosis not present

## 2017-05-29 DIAGNOSIS — R194 Change in bowel habit: Secondary | ICD-10-CM

## 2017-05-29 DIAGNOSIS — D125 Benign neoplasm of sigmoid colon: Secondary | ICD-10-CM | POA: Diagnosis not present

## 2017-05-29 DIAGNOSIS — Z800 Family history of malignant neoplasm of digestive organs: Secondary | ICD-10-CM | POA: Diagnosis not present

## 2017-05-29 DIAGNOSIS — Z6841 Body Mass Index (BMI) 40.0 and over, adult: Secondary | ICD-10-CM | POA: Diagnosis not present

## 2017-05-29 DIAGNOSIS — K5730 Diverticulosis of large intestine without perforation or abscess without bleeding: Secondary | ICD-10-CM | POA: Insufficient documentation

## 2017-05-29 DIAGNOSIS — D122 Benign neoplasm of ascending colon: Secondary | ICD-10-CM | POA: Insufficient documentation

## 2017-05-29 DIAGNOSIS — D128 Benign neoplasm of rectum: Secondary | ICD-10-CM | POA: Diagnosis not present

## 2017-05-29 DIAGNOSIS — Z79899 Other long term (current) drug therapy: Secondary | ICD-10-CM | POA: Diagnosis not present

## 2017-05-29 DIAGNOSIS — I10 Essential (primary) hypertension: Secondary | ICD-10-CM | POA: Insufficient documentation

## 2017-05-29 HISTORY — PX: COLONOSCOPY WITH PROPOFOL: SHX5780

## 2017-05-29 HISTORY — PX: POLYPECTOMY: SHX5525

## 2017-05-29 LAB — GLUCOSE, CAPILLARY: Glucose-Capillary: 132 mg/dL — ABNORMAL HIGH (ref 65–99)

## 2017-05-29 SURGERY — COLONOSCOPY WITH PROPOFOL
Anesthesia: Monitor Anesthesia Care

## 2017-05-29 MED ORDER — ONDANSETRON HCL 4 MG/2ML IJ SOLN
4.0000 mg | Freq: Once | INTRAMUSCULAR | Status: AC
  Administered 2017-05-29: 4 mg via INTRAVENOUS

## 2017-05-29 MED ORDER — LACTATED RINGERS IV SOLN
INTRAVENOUS | Status: DC
  Administered 2017-05-29: 10:00:00 via INTRAVENOUS

## 2017-05-29 MED ORDER — FENTANYL CITRATE (PF) 100 MCG/2ML IJ SOLN
25.0000 ug | Freq: Once | INTRAMUSCULAR | Status: AC
  Administered 2017-05-29: 25 ug via INTRAVENOUS

## 2017-05-29 MED ORDER — MIDAZOLAM HCL 2 MG/2ML IJ SOLN
INTRAMUSCULAR | Status: AC
  Filled 2017-05-29: qty 2

## 2017-05-29 MED ORDER — GLYCOPYRROLATE 0.2 MG/ML IJ SOLN
0.2000 mg | Freq: Once | INTRAMUSCULAR | Status: AC | PRN
  Administered 2017-05-29: 0.2 mg via INTRAVENOUS

## 2017-05-29 MED ORDER — GLYCOPYRROLATE 0.2 MG/ML IJ SOLN
INTRAMUSCULAR | Status: AC
  Filled 2017-05-29: qty 1

## 2017-05-29 MED ORDER — ONDANSETRON HCL 4 MG/2ML IJ SOLN
INTRAMUSCULAR | Status: AC
  Filled 2017-05-29: qty 2

## 2017-05-29 MED ORDER — STERILE WATER FOR IRRIGATION IR SOLN
Status: DC | PRN
  Administered 2017-05-29: 100 mL

## 2017-05-29 MED ORDER — PROPOFOL 500 MG/50ML IV EMUL
INTRAVENOUS | Status: DC | PRN
  Administered 2017-05-29: 125 ug/kg/min via INTRAVENOUS
  Administered 2017-05-29 (×2): via INTRAVENOUS

## 2017-05-29 MED ORDER — FENTANYL CITRATE (PF) 100 MCG/2ML IJ SOLN
INTRAMUSCULAR | Status: AC
  Filled 2017-05-29: qty 2

## 2017-05-29 MED ORDER — MIDAZOLAM HCL 2 MG/2ML IJ SOLN
1.0000 mg | INTRAMUSCULAR | Status: AC
  Administered 2017-05-29: 2 mg via INTRAVENOUS

## 2017-05-29 NOTE — Op Note (Signed)
[facility] Patient Name: Bradley Torres Procedure Date: 05/29/2017 10:21 AM MRN: [id number] Date of Birth: Jun 14, 1960 Attending MD: Norvel Richards , MD CSN: [id number] Age: 57 Admit Type: Outpatient Procedure:                Colonoscopy Indications:              Change in bowel habits Providers:                Norvel Richards, MD, Rosina Lowenstein, RN, Randa Spike, Technician Referring MD:              Medicines:                Propofol total dose mg IV Complications:            No immediate complications. Estimated Blood Loss:     Estimated blood loss was minimal. Estimated blood                            loss was minimal. Procedure:                Pre-Anesthesia Assessment:                           - Prior to the procedure, a History and Physical                            was performed, and patient medications and                            allergies were reviewed. The patient's tolerance of                            previous anesthesia was also reviewed. The risks                            and benefits of the procedure and the sedation                            options and risks were discussed with the patient.                            All questions were answered, and informed consent                            was obtained. Prior Anticoagulants: The patient has                            taken no previous anticoagulant or antiplatelet                            agents. ASA Grade Assessment: III - A patient with  severe systemic disease. After reviewing the risks                            and benefits, the patient was deemed in                            satisfactory condition to undergo the procedure.                           After obtaining informed consent, the colonoscope                            was passed under direct vision. Throughout the                            procedure, the patient's blood  pressure, pulse, and                            oxygen saturations were monitored continuously. The                            EC-[id number] ([id number]) scope was introduced through                            the and advanced to the the cecum, identified by                            appendiceal orifice and ileocecal valve. The                            colonoscopy was performed without difficulty. The                            patient tolerated the procedure well. The quality                            of the bowel preparation was inadequate. The entire                            colon was not examined. The ileocecal valve,                            appendiceal orifice, and rectum were photographed. Scope In: 10:29:12 AM Scope Out: 11:11:40 AM Scope Withdrawal Time: 0 hours 35 minutes 38 seconds  Total Procedure Duration: 0 hours 42 minutes 28 seconds  Findings:      The perianal and digital rectal examinations were normal.      Multiple small and large-mouthed diverticula were found in the entire       colon.      Nine semi-pedunculated polyps were found in the transverse colon and       ascending colon. The polyps were 5 to 7 mm in size. These polyps were       removed with a cold snare. Resection and retrieval were complete.  Estimated blood loss was minimal. Estimated blood loss was minimal.      Three pedunculated polyps were found in the rectum, sigmoid colon and       descending colon. The polyps were 8 to 15 mm in size (largest 1 in       sigmoid segment). These polyps were removed with a hot snare. Resection       and retrieval were complete. Estimated blood loss: none.      The exam was otherwise without abnormality on direct and retroflexion       views. Because of inadequate preparation, all mucosal surfaces could not       be seen today. Impression:               - Diverticulosis in the entire examined colon.                           - Nine 5 to 7 mm polyps in the  transverse colon and                            in the ascending colon, removed with a cold snare.                            Resected and retrieved.                           - Three 8 to 15 mm polyps in the rectum, in the                            sigmoid colon and in the descending colon, removed                            with a hot snare. Resected and retrieved.                           - The examination was otherwise normal on direct                            and retroflexion views. Inadequate preparation. Moderate Sedation:      Moderate (conscious) sedation was personally administered by an       anesthesia professional. The following parameters were monitored: oxygen       saturation, heart rate, blood pressure, respiratory rate, EKG, adequacy       of pulmonary ventilation, and response to care. Total physician       intraservice time was 47 minutes. Recommendation:           - Patient has a contact number available for                            emergencies. The signs and symptoms of potential                            delayed complications were discussed with the  patient. Return to normal activities tomorrow.                            Written discharge instructions were provided to the                            patient.                           - Resume previous diet.                           - Continue present medications.                           - Repeat colonoscopy in 6 months for surveillance.                           - Return to GI clinic in 2 months. Procedure Code(s):        --- Professional ---                           [phone], Colonoscopy, flexible; with removal of                            tumor(s), polyp(s), or other lesion(s) by snare                            technique Diagnosis Code(s):        --- Professional ---                           D12.3, Benign neoplasm of transverse colon (hepatic                             flexure or splenic flexure)                           D12.2, Benign neoplasm of ascending colon                           K62.1, Rectal polyp                           D12.5, Benign neoplasm of sigmoid colon                           D12.4, Benign neoplasm of descending colon                           R19.4, Change in bowel habit                           K57.30, Diverticulosis of large intestine without                            perforation or abscess without bleeding CPT  copyright 2016 American Medical Association. All rights reserved. The codes documented in this report are preliminary and upon coder review may  be revised to meet current compliance requirements. Cristopher Estimable. Lindalou Soltis, MD Norvel Richards, MD 05/29/2017 11:27:52 AM This report has been signed electronically. Number of Addenda: 0

## 2017-05-29 NOTE — Discharge Instructions (Signed)
Colon Polyps °Polyps are tissue growths inside the body. Polyps can grow in many places, including the large intestine (colon). A polyp may be a round bump or a mushroom-shaped growth. You could have one polyp or several. °Most colon polyps are noncancerous (benign). However, some colon polyps can become cancerous over time. °What are the causes? °The exact cause of colon polyps is not known. °What increases the risk? °This condition is more likely to develop in people who: °· Have a family history of colon cancer or colon polyps. °· Are older than 50 or older than 45 if they are African American. °· Have inflammatory bowel disease, such as ulcerative colitis or Crohn disease. °· Are overweight. °· Smoke cigarettes. °· Do not get enough exercise. °· Drink too much alcohol. °· Eat a diet that is: °? High in fat and red meat. °? Low in fiber. °· Had childhood cancer that was treated with abdominal radiation. ° °What are the signs or symptoms? °Most polyps do not cause symptoms. If you have symptoms, they may include: °· Blood coming from your rectum when having a bowel movement. °· Blood in your stool. The stool may look dark red or black. °· A change in bowel habits, such as constipation or diarrhea. ° °How is this diagnosed? °This condition is diagnosed with a colonoscopy. This is a procedure that uses a lighted, flexible scope to look at the inside of your colon. °How is this treated? °Treatment for this condition involves removing any polyps that are found. Those polyps will then be tested for cancer. If cancer is found, your health care provider will talk to you about options for colon cancer treatment. °Follow these instructions at home: °Diet °· Eat plenty of fiber, such as fruits, vegetables, and whole grains. °· Eat foods that are high in calcium and vitamin D, such as milk, cheese, yogurt, eggs, liver, fish, and broccoli. °· Limit foods high in fat, red meats, and processed meats, such as hot dogs, sausage,  bacon, and lunch meats. °· Maintain a healthy weight, or lose weight if recommended by your health care provider. °General instructions °· Do not smoke cigarettes. °· Do not drink alcohol excessively. °· Keep all follow-up visits as told by your health care provider. This is important. This includes keeping regularly scheduled colonoscopies. Talk to your health care provider about when you need a colonoscopy. °· Exercise every day or as told by your health care provider. °Contact a health care provider if: °· You have new or worsening bleeding during a bowel movement. °· You have new or increased blood in your stool. °· You have a change in bowel habits. °· You unexpectedly lose weight. °This information is not intended to replace advice given to you by your health care provider. Make sure you discuss any questions you have with your health care provider. °Document Released: 12/09/2003 Document Revised: 08/20/2015 Document Reviewed: 02/02/2015 °Elsevier Interactive Patient Education © 2018 [employer] ° °Colonoscopy °Discharge Instructions ° °Read the instructions outlined below and refer to this sheet in the next few weeks. These discharge instructions provide you with general information on caring for yourself after you leave the hospital. Your doctor may also give you specific instructions. While your treatment has been planned according to the most current medical practices available, unavoidable complications occasionally occur. If you have any problems or questions after discharge, call Dr. Rourk at [phone]. °ACTIVITY °· You may resume your regular activity, but move at a slower pace for the next 24   hours.   Take frequent rest periods for the next 24 hours.   Walking will help get rid of the air and reduce the bloated feeling in your belly (abdomen).   No driving for 24 hours (because of the medicine (anesthesia) used during the test).    Do not sign any important legal documents or operate any  machinery for 24 hours (because of the anesthesia used during the test).  NUTRITION  Drink plenty of fluids.   You may resume your normal diet as instructed by your doctor.   Begin with a light meal and progress to your normal diet. Heavy or fried foods are harder to digest and may make you feel sick to your stomach (nauseated).   Avoid alcoholic beverages for 24 hours or as instructed.  MEDICATIONS  You may resume your normal medications unless your doctor tells you otherwise.  WHAT YOU CAN EXPECT TODAY  Some feelings of bloating in the abdomen.   Passage of more gas than usual.   Spotting of blood in your stool or on the toilet paper.  IF YOU HAD POLYPS REMOVED DURING THE COLONOSCOPY:  No aspirin products for 7 days or as instructed.   No alcohol for 7 days or as instructed.   Eat a soft diet for the next 24 hours.  FINDING OUT THE RESULTS OF YOUR TEST Not all test results are available during your visit. If your test results are not back during the visit, make an appointment with your caregiver to find out the results. Do not assume everything is normal if you have not heard from your caregiver or the medical facility. It is important for you to follow up on all of your test results.  SEEK IMMEDIATE MEDICAL ATTENTION IF:  You have more than a spotting of blood in your stool.   Your belly is swollen (abdominal distention).   You are nauseated or vomiting.   You have a temperature over 101.   You have abdominal pain or discomfort that is severe or gets worse throughout the day.    Diverticulosis and colon polyp information provided  Your colonoscopy preparation today was poor. You had multiple polyps removed. There may be more that could not be seen today.  You will need an early follow-up colonoscopy  Further recommendations to follow pending review of pathology report  Office visit with [place] in 8 weeks     Diverticulosis Diverticulosis is a condition that  develops when small pouches (diverticula) form in the wall of the large intestine (colon). The colon is where water is absorbed and stool is formed. The pouches form when the inside layer of the colon pushes through weak spots in the outer layers of the colon. You may have a few pouches or many of them. What are the causes? The cause of this condition is not known. What increases the risk? The following factors may make you more likely to develop this condition:  Being older than age 15. Your risk for this condition increases with age. Diverticulosis is rare among people younger than age 23. By age 90, many people have it.  Eating a low-fiber diet.  Having frequent constipation.  Being overweight.  Not getting enough exercise.  Smoking.  Taking over-the-counter pain medicines, like aspirin and ibuprofen.  Having a family history of diverticulosis.  What are the signs or symptoms? In most people, there are no symptoms of this condition. If you do have symptoms, they may include:  Bloating.  Cramps in  the abdomen.  Constipation or diarrhea.  Pain in the lower left side of the abdomen.  How is this diagnosed? This condition is most often diagnosed during an exam for other colon problems. Because diverticulosis usually has no symptoms, it often cannot be diagnosed independently. This condition may be diagnosed by:  Using a flexible scope to examine the colon (colonoscopy).  Taking an X-ray of the colon after dye has been put into the colon (barium enema).  Doing a CT scan.  How is this treated? You may not need treatment for this condition if you have never developed an infection related to diverticulosis. If you have had an infection before, treatment may include:  Eating a high-fiber diet. This may include eating more fruits, vegetables, and grains.  Taking a fiber supplement.  Taking a live bacteria supplement (probiotic).  Taking medicine to relax your  colon.  Taking antibiotic medicines.  Follow these instructions at home:  Drink 6-8 glasses of water or more each day to prevent constipation.  Try not to strain when you have a bowel movement.  If you have had an infection before: ? Eat more fiber as directed by your health care provider or your diet and nutrition specialist (dietitian). ? Take a fiber supplement or probiotic, if your health care provider approves.  Take over-the-counter and prescription medicines only as told by your health care provider.  If you were prescribed an antibiotic, take it as told by your health care provider. Do not stop taking the antibiotic even if you start to feel better.  Keep all follow-up visits as told by your health care provider. This is important. Contact a health care provider if:  You have pain in your abdomen.  You have bloating.  You have cramps.  You have not had a bowel movement in 3 days. Get help right away if:  Your pain gets worse.  Your bloating becomes very bad.  You have a fever or chills, and your symptoms suddenly get worse.  You vomit.  You have bowel movements that are bloody or black.  You have bleeding from your rectum. Summary  Diverticulosis is a condition that develops when small pouches (diverticula) form in the wall of the large intestine (colon).  You may have a few pouches or many of them.  This condition is most often diagnosed during an exam for other colon problems.  If you have had an infection related to diverticulosis, treatment may include increasing the fiber in your diet, taking supplements, or taking medicines. This information is not intended to replace advice given to you by your health care provider. Make sure you discuss any questions you have with your health care provider. Document Released: 12/10/2003 Document Revised: 02/01/2016 Document Reviewed: 02/01/2016 Elsevier Interactive Patient Education  2017 [employer].     Colonoscopy, Adult, Care After This sheet gives you information about how to care for yourself after your procedure. Your doctor may also give you more specific instructions. If you have problems or questions, call your doctor. Follow these instructions at home: General instructions   For the first 24 hours after the procedure: ? Do not drive or use machinery. ? Do not sign important documents. ? Do not drink alcohol. ? Do your daily activities more slowly than normal. ? Eat foods that are soft and easy to digest. ? Rest often.  Take over-the-counter or prescription medicines only as told by your doctor.  It is up to you to get the results of  your procedure. Ask your doctor, or the department performing the procedure, when your results will be ready. To help cramping and bloating:  Try walking around.  Put heat on your belly (abdomen) as told by your doctor. Use a heat source that your doctor recommends, such as a moist heat pack or a heating pad. ? Put a towel between your skin and the heat source. ? Leave the heat on for 20-30 minutes. ? Remove the heat if your skin turns bright red. This is especially important if you cannot feel pain, heat, or cold. You can get burned. Eating and drinking  Drink enough fluid to keep your pee (urine) clear or pale yellow.  Return to your normal diet as told by your doctor. Avoid heavy or fried foods that are hard to digest.  Avoid drinking alcohol for as long as told by your doctor. Contact a doctor if:  You have blood in your poop (stool) 2-3 days after the procedure. Get help right away if:  You have more than a small amount of blood in your poop.  You see large clumps of tissue (blood clots) in your poop.  Your belly is swollen.  You feel sick to your stomach (nauseous).  You throw up (vomit).  You have a fever.  You have belly pain that gets worse, and medicine does not help your pain. This information is not  intended to replace advice given to you by your health care provider. Make sure you discuss any questions you have with your health care provider. Document Released: 04/16/2010 Document Revised: 12/07/2015 Document Reviewed: 12/07/2015 Elsevier Interactive Patient Education  2017 [employer].

## 2017-05-29 NOTE — Interval H&P Note (Signed)
History and Physical Interval Note:  05/29/2017 10:01 AM  Bradley Torres  has presented today for surgery, with the diagnosis of change in bowels, FH colorectal cancer  The various methods of treatment have been discussed with the patient and family. After consideration of risks, benefits and other options for treatment, the patient has consented to  Procedure(s) with comments: COLONOSCOPY WITH PROPOFOL (N/A) - 2:00pm as a surgical intervention .  The patient's history has been reviewed, patient examined, no change in status, stable for surgery.  I have reviewed the patient's chart and labs.  Questions were answered to the patient's satisfaction.     Bradley Torres  No change. Colonoscopy today per plan.    The risks, benefits, limitations, alternatives and imponderables have been reviewed with the patient. Questions have been answered. All parties are agreeable.

## 2017-05-29 NOTE — Anesthesia Preprocedure Evaluation (Signed)
Anesthesia Evaluation  Patient identified by MRN, date of birth, ID band Patient awake    Reviewed: Allergy & Precautions, NPO status , Patient's Chart, lab work & pertinent test results  Airway Mallampati: II  TM Distance: >3 FB Neck ROM: Full    Dental  (+) Teeth Intact   Pulmonary neg pulmonary ROS,    breath sounds clear to auscultation       Cardiovascular hypertension, Pt. on medications  Rhythm:Regular Rate:Normal     Neuro/Psych PSYCHIATRIC DISORDERS Anxiety negative neurological ROS     GI/Hepatic   Endo/Other  Morbid obesity  Renal/GU      Musculoskeletal  (+) Arthritis  (chronic LBP),   Abdominal   Peds  Hematology   Anesthesia Other Findings   Reproductive/Obstetrics                             Anesthesia Physical Anesthesia Plan  ASA: III  Anesthesia Plan: MAC   Post-op Pain Management:    Induction: Intravenous  PONV Risk Score and Plan:   Airway Management Planned: Simple Face Mask  Additional Equipment:   Intra-op Plan:   Post-operative Plan:   Informed Consent: I have reviewed the patients History and Physical, chart, labs and discussed the procedure including the risks, benefits and alternatives for the proposed anesthesia with the patient or authorized representative who has indicated his/her understanding and acceptance.     Plan Discussed with:   Anesthesia Plan Comments:         Anesthesia Quick Evaluation

## 2017-05-29 NOTE — Interval H&P Note (Signed)
History and Physical Interval Note:  05/29/2017 10:06 AM  Bradley Torres  has presented today for surgery, with the diagnosis of change in bowels, FH colorectal cancer  The various methods of treatment have been discussed with the patient and family. After consideration of risks, benefits and other options for treatment, the patient has consented to  Procedure(s) with comments: COLONOSCOPY WITH PROPOFOL (N/A) - 2:00pm as a surgical intervention .  The patient's history has been reviewed, patient examined, no change in status, stable for surgery.  I have reviewed the patient's chart and labs.  Questions were answered to the patient's satisfaction.    Change in bowel habits. No change. Diagnostic colonoscopy today per plan.  The risks, benefits, limitations, alternatives and imponderables have been reviewed with the patient. Questions have been answered. All parties are agreeable.    Bradley Torres

## 2017-05-29 NOTE — Anesthesia Procedure Notes (Signed)
Procedure Name: MAC Date/Time: 05/29/2017 10:19 AM Performed by: Andree Elk Lestine Rahe A, CRNA Pre-anesthesia Checklist: Patient identified, Emergency Drugs available, Suction available, Patient being monitored and Timeout performed Oxygen Delivery Method: Simple face mask

## 2017-05-29 NOTE — Anesthesia Postprocedure Evaluation (Signed)
Anesthesia Post Note  Patient: KASTIN CERDA  Procedure(s) Performed: COLONOSCOPY WITH PROPOFOL (N/A ) POLYPECTOMY  Patient location during evaluation: PACU Anesthesia Type: MAC Level of consciousness: awake and alert, oriented and patient cooperative Pain management: pain level controlled Vital Signs Assessment: post-procedure vital signs reviewed and stable Respiratory status: spontaneous breathing and respiratory function stable Cardiovascular status: stable Postop Assessment: no apparent nausea or vomiting Anesthetic complications: no     Last Vitals:  Vitals:   05/29/17 1010 05/29/17 1015  BP: 139/81   Pulse:    Resp: (!) 26 15  Temp:    SpO2: 94% 94%    Last Pain:  Vitals:   05/29/17 0955  TempSrc: Oral                 Giovanie Lefebre A

## 2017-05-29 NOTE — Transfer of Care (Signed)
Immediate Anesthesia Transfer of Care Note  Patient: Bradley Torres  Procedure(s) Performed: COLONOSCOPY WITH PROPOFOL (N/A ) POLYPECTOMY  Patient Location: PACU  Anesthesia Type:MAC  Level of Consciousness: awake, alert , oriented and patient cooperative  Airway & Oxygen Therapy: Patient Spontanous Breathing  Post-op Assessment: Report given to RN and Post -op Vital signs reviewed and stable  Post vital signs: Reviewed and stable  Last Vitals:  Vitals:   05/29/17 1010 05/29/17 1015  BP: 139/81   Pulse:    Resp: (!) 26 15  Temp:    SpO2: 94% 94%    Last Pain:  Vitals:   05/29/17 0955  TempSrc: Oral         Complications: No apparent anesthesia complications

## 2017-05-31 ENCOUNTER — Encounter (HOSPITAL_COMMUNITY): Payer: Self-pay | Admitting: Internal Medicine

## 2017-05-31 ENCOUNTER — Encounter: Payer: Self-pay | Admitting: Internal Medicine

## 2017-07-26 ENCOUNTER — Encounter: Payer: Self-pay | Admitting: Gastroenterology

## 2017-07-26 ENCOUNTER — Ambulatory Visit: Payer: Medicaid Other | Admitting: Gastroenterology

## 2017-07-26 DIAGNOSIS — Z86010 Personal history of colonic polyps: Secondary | ICD-10-CM

## 2017-07-26 NOTE — Progress Notes (Signed)
      Primary Care Physician: Terald Sleeper, PA-C  Primary Gastroenterologist:  Garfield Cornea, MD   Chief Complaint  Patient presents with  . Follow-up    pp. BM's are improved, daily    HPI: Bradley Torres is a 57 y.o. male here for f/u. Treated empirically for possible colitis vs diveriticulitis with cipro/flagyl in 02/2017 by PCP. No prior CT imaging. Symptoms improved on abx. Treated again in 04/2017 when he developed recurrent symptoms. Colonoscopy in 05/2017 with multiple tubular adenomas removed.  Prep was inadequate.  Surveillance colonoscopy recommended in 11/2017 (six month f/u).  Clinically patient is doing well.  Having a bowel movement every day.  No melena or rectal bleeding.  No significant abdominal pain.  He notes that Clenpiq did not seem to be cleaning him out adequately.  Did not seem to be "strong enough".  He is wondering if he can put off his colonoscopy until early 2020 if Dr. [name] is agreeable.  Current Outpatient Medications  Medication Sig Dispense Refill  . bisoprolol-hydrochlorothiazide (ZIAC) 10-6.25 MG tablet Take 1 tablet by mouth daily. 90 tablet 3  . cyclobenzaprine (FLEXERIL) 10 MG tablet TAKE 1 TABLET BY MOUTH THREE TIMES DAILY AS NEEDED (Patient taking differently: TAKE 1 TABLET BY MOUTH THREE TIMES DAILY AS NEEDED FOR BACK SPASMS.) 90 tablet 0  . lisinopril-hydrochlorothiazide (PRINZIDE,ZESTORETIC) 20-25 MG tablet Take 1 tablet by mouth daily. 90 tablet 3  . oxyCODONE-acetaminophen (PERCOCET) 10-325 MG tablet Take 1 tablet by mouth 2 (two) times daily. 60 tablet 0  . terbinafine (LAMISIL) 1 % cream Apply 1 application topically 2 (two) times daily as needed (FOR ITCHY SKIN.).     No current facility-administered medications for this visit.     Allergies as of 07/26/2017 - Review Complete 07/26/2017  Allergen Reaction Noted  . Ibuprofen Other (See Comments) 11/20/2013  . Losartan Rash 03/17/2016    ROS:  General: Negative for anorexia, weight  loss, fever, chills, fatigue, weakness. ENT: Negative for hoarseness, difficulty swallowing , nasal congestion. CV: Negative for chest pain, angina, palpitations, dyspnea on exertion, peripheral edema.  Respiratory: Negative for dyspnea at rest, dyspnea on exertion, cough, sputum, wheezing.  GI: See history of present illness. GU:  Negative for dysuria, hematuria, urinary incontinence, urinary frequency, nocturnal urination.  Endo: Negative for unusual weight change.    Physical Examination:   BP 131/89   Pulse 66   Temp (!) 97 F (36.1 C) (Oral)   Ht 5\' 11"  (1.803 m)   Wt (!) 413 lb 6.4 oz (187.5 kg)   BMI 57.66 kg/m   General: Well-nourished, well-developed in no acute distress.  Eyes: No icterus. Mouth: Oropharyngeal mucosa moist and pink , no lesions erythema or exudate. Abdomen: Bowel sounds are normal, nontender, nondistended, Neuro: Alert and oriented    Skin: Warm and dry, no jaundice.   Psych: Alert and cooperative, normal mood and affect.  Labs:  Lab Results  Component Value Date   WBC 7.9 05/22/2017   HGB 14.7 05/22/2017   HCT 44.4 05/22/2017   MCV 97.8 05/22/2017   PLT 285 05/22/2017    Imaging Studies: No results found.

## 2017-07-26 NOTE — Progress Notes (Signed)
cc'ed to pcp °

## 2017-07-26 NOTE — Patient Instructions (Signed)
1. We will be in touch regarding recommendations for next colonoscopy.

## 2017-07-26 NOTE — Assessment & Plan Note (Signed)
57 year old gentleman with history of change in bowel habits, abdominal discomfort who underwent empiric treatment with Cipro and Flagyl twice in the past 6 months with resolution of his symptoms presents back for follow-up today after recent colonoscopy.  Unfortunately his bowel prep was inadequate.  He also had 12 polyps removed from his colon, tubular adenomas.  He has been advised to come back for six-month follow-up surveillance colonoscopy which would be in September 2019.  Clinically he is doing better.  Having regular bowel movement every day.  Denies any significant abdominal pain.  He would like to push back his colonoscopy a little bit further if Dr. [name] agrees with this. Maybe have it done in early 2020.  I will discuss further with Dr. [name].  Further recommendations to be provided to the patient accordingly.  In the interim, patient will let me know if he has any recurrent symptoms.

## 2017-07-31 ENCOUNTER — Encounter: Payer: Self-pay | Admitting: Physician Assistant

## 2017-07-31 ENCOUNTER — Ambulatory Visit: Payer: Medicaid Other | Admitting: Physician Assistant

## 2017-07-31 VITALS — BP 133/85 | HR 69 | Ht 71.0 in | Wt >= 6400 oz

## 2017-07-31 DIAGNOSIS — M545 Low back pain: Secondary | ICD-10-CM

## 2017-07-31 DIAGNOSIS — M25561 Pain in right knee: Secondary | ICD-10-CM

## 2017-07-31 DIAGNOSIS — M25552 Pain in left hip: Secondary | ICD-10-CM

## 2017-07-31 DIAGNOSIS — M25562 Pain in left knee: Secondary | ICD-10-CM

## 2017-07-31 DIAGNOSIS — I10 Essential (primary) hypertension: Secondary | ICD-10-CM | POA: Diagnosis not present

## 2017-07-31 DIAGNOSIS — G8929 Other chronic pain: Secondary | ICD-10-CM | POA: Diagnosis not present

## 2017-07-31 DIAGNOSIS — M25551 Pain in right hip: Secondary | ICD-10-CM | POA: Diagnosis not present

## 2017-07-31 DIAGNOSIS — R7303 Prediabetes: Secondary | ICD-10-CM

## 2017-07-31 MED ORDER — AZITHROMYCIN 250 MG PO TABS: ORAL_TABLET | ORAL | 0 refills | Status: DC

## 2017-07-31 MED ORDER — OXYCODONE-ACETAMINOPHEN 10-325 MG PO TABS: 1.0000 | ORAL_TABLET | Freq: Three times a day (TID) | ORAL | 0 refills | Status: DC | PRN

## 2017-07-31 MED ORDER — OXYCODONE-ACETAMINOPHEN 10-325 MG PO TABS: 1.0000 | ORAL_TABLET | Freq: Two times a day (BID) | ORAL | 0 refills | Status: DC

## 2017-07-31 MED ORDER — LISINOPRIL-HYDROCHLOROTHIAZIDE 20-25 MG PO TABS: 1.0000 | ORAL_TABLET | Freq: Every day | ORAL | 3 refills | Status: DC

## 2017-07-31 MED ORDER — METHYLPREDNISOLONE ACETATE 40 MG/ML IJ SUSP
40.0000 mg | Freq: Once | INTRAMUSCULAR | Status: AC
Start: 2017-07-31 — End: 2017-07-31
  Administered 2017-07-31: 40 mg via INTRAMUSCULAR

## 2017-08-01 LAB — CMP14+EGFR
ALT: 22 IU/L (ref 0–44)
AST: 21 IU/L (ref 0–40)
Albumin/Globulin Ratio: 1.4 (ref 1.2–2.2)
Albumin: 4 g/dL (ref 3.5–5.5)
Alkaline Phosphatase: 64 IU/L (ref 39–117)
BUN/Creatinine Ratio: 23 — ABNORMAL HIGH (ref 9–20)
BUN: 28 mg/dL — AB (ref 6–24)
Bilirubin Total: 0.4 mg/dL (ref 0.0–1.2)
CALCIUM: 9.2 mg/dL (ref 8.7–10.2)
CO2: 26 mmol/L (ref 20–29)
CREATININE: 1.21 mg/dL (ref 0.76–1.27)
Chloride: 98 mmol/L (ref 96–106)
GFR, EST AFRICAN AMERICAN: 77 mL/min/{1.73_m2} (ref >59)
GFR, EST NON AFRICAN AMERICAN: 67 mL/min/{1.73_m2} (ref >59)
GLUCOSE: 145 mg/dL — AB (ref 65–99)
Globulin, Total: 2.9 g/dL (ref 1.5–4.5)
Potassium: 4.9 mmol/L (ref 3.5–5.2)
Sodium: 138 mmol/L (ref 134–144)
TOTAL PROTEIN: 6.9 g/dL (ref 6.0–8.5)

## 2017-08-01 LAB — CBC WITH DIFFERENTIAL/PLATELET
BASOS ABS: 0.1 10*3/uL (ref 0.0–0.2)
BASOS: 1 %
EOS (ABSOLUTE): 0.2 10*3/uL (ref 0.0–0.4)
Eos: 3 %
Hematocrit: 41.5 % (ref 37.5–51.0)
Hemoglobin: 13.9 g/dL (ref 13.0–17.7)
IMMATURE GRANS (ABS): 0.1 10*3/uL (ref 0.0–0.1)
IMMATURE GRANULOCYTES: 1 %
Lymphocytes Absolute: 0.9 10*3/uL (ref 0.7–3.1)
Lymphs: 12 %
MCH: 32.4 pg (ref 26.6–33.0)
MCHC: 33.5 g/dL (ref 31.5–35.7)
MCV: 97 fL (ref 79–97)
Monocytes Absolute: 0.7 10*3/uL (ref 0.1–0.9)
Monocytes: 9 %
Neutrophils Absolute: 5.7 10*3/uL (ref 1.4–7.0)
Neutrophils: 74 %
PLATELETS: 274 10*3/uL (ref 150–379)
RBC: 4.29 x10E6/uL (ref 4.14–5.80)
RDW: 13.3 % (ref 12.3–15.4)
WBC: 7.5 10*3/uL (ref 3.4–10.8)

## 2017-08-01 LAB — LIPID PANEL
CHOL/HDL RATIO: 4.6 ratio (ref 0.0–5.0)
Cholesterol, Total: 179 mg/dL (ref 100–199)
HDL: 39 mg/dL — AB (ref >39)
LDL Calculated: 116 mg/dL — ABNORMAL HIGH (ref 0–99)
Triglycerides: 118 mg/dL (ref 0–149)
VLDL CHOLESTEROL CAL: 24 mg/dL (ref 5–40)

## 2017-08-01 NOTE — Progress Notes (Signed)
BP 133/85   Pulse 69   Ht 5' 11"  (1.803 m)   Wt (!) 417 lb 9.6 oz (189.4 kg)   BMI 58.24 kg/m    Subjective:    Patient ID: Bradley Torres, male    DOB: [birth year], 57 y.o.   MRN: [id number]  HPI: Bradley Torres is a 57 y.o. male presenting on 07/31/2017 for Hypertension and Prediabetes  This patient comes in for periodic recheck on medications and conditions including hypertension, prediabetes, GERD.  The patient did have a colonoscopy performed and had 12 polyps.  He will be going back in 3 months.  He was not completely cleaned out and they are concerned it could be some more left.  He does have a strong family history of colon cancer.  He is doing well with his blood pressure.  He rarely has to take much medication for his GERD.  Sometimes Rolaids.  He notices if he eats a heavy meal he will have a little bit more GERD but not severely.   All medications are reviewed today. There are no reports of any problems with the medications. All of the medical conditions are reviewed and updated.  Lab work is reviewed and will be ordered as medically necessary. There are no new problems reported with today's visit.  Past Medical History:  Diagnosis Date  . Chronic back pain   . Essential hypertension   . Morbid obesity ([place])   . Panic disorder   . Prediabetes   . Shoulder pain, left    Following fall   Relevant past medical, surgical, family and social history reviewed and updated as indicated. Interim medical history since our last visit reviewed. Allergies and medications reviewed and updated. DATA REVIEWED: CHART IN EPIC  Family History reviewed for pertinent findings.  Review of Systems  Constitutional: Negative.  Negative for appetite change and fatigue.  HENT: Negative.   Eyes: Negative.  Negative for pain and visual disturbance.  Respiratory: Negative.  Negative for cough, chest tightness, shortness of breath and wheezing.   Cardiovascular: Positive for leg swelling.  Negative for chest pain and palpitations.  Gastrointestinal: Negative.  Negative for abdominal pain, diarrhea, nausea and vomiting.  Endocrine: Negative.   Genitourinary: Negative.   Musculoskeletal: Positive for arthralgias and back pain.  Skin: Negative.  Negative for color change and rash.  Neurological: Negative.  Negative for weakness, numbness and headaches.  Psychiatric/Behavioral: Negative.     Allergies as of 07/31/2017      Reactions   Ibuprofen Other (See Comments)   Rectal bleed   Losartan Rash      Medication List        Accurate as of 07/31/17 11:59 PM. Always use your most recent med list.          azithromycin 250 MG tablet Commonly known as:  ZITHROMAX Z-PAK Take as directed   bisoprolol-hydrochlorothiazide 10-6.25 MG tablet Commonly known as:  ZIAC Take 1 tablet by mouth daily.   cyclobenzaprine 10 MG tablet Commonly known as:  FLEXERIL TAKE 1 TABLET BY MOUTH THREE TIMES DAILY AS NEEDED   lisinopril-hydrochlorothiazide 20-25 MG tablet Commonly known as:  PRINZIDE,ZESTORETIC Take 1 tablet by mouth daily.   oxyCODONE-acetaminophen 10-325 MG tablet Commonly known as:  PERCOCET Take 1 tablet by mouth 2 (two) times daily.   oxyCODONE-acetaminophen 10-325 MG tablet Commonly known as:  PERCOCET Take 1 tablet by mouth every 8 (eight) hours as needed for pain.   oxyCODONE-acetaminophen 10-325 MG  tablet Commonly known as:  PERCOCET Take 1 tablet by mouth 2 (two) times daily.   terbinafine 1 % cream Commonly known as:  LAMISIL Apply 1 application topically 2 (two) times daily as needed (FOR ITCHY SKIN.).          Objective:    BP 133/85   Pulse 69   Ht 5' 11"  (1.803 m)   Wt (!) 417 lb 9.6 oz (189.4 kg)   BMI 58.24 kg/m   Allergies  Allergen Reactions  . Ibuprofen Other (See Comments)    Rectal bleed  . Losartan Rash    Wt Readings from Last 3 Encounters:  07/31/17 (!) 417 lb 9.6 oz (189.4 kg)  07/26/17 (!) 413 lb 6.4 oz (187.5 kg)    05/22/17 (!) 414 lb (187.8 kg)    Physical Exam  Constitutional: He appears well-developed and well-nourished. No distress.  HENT:  Head: Normocephalic and atraumatic.  Eyes: Pupils are equal, round, and reactive to light. Conjunctivae and EOM are normal.  Cardiovascular: Normal rate, regular rhythm and normal heart sounds.  Pulmonary/Chest: Effort normal and breath sounds normal. No respiratory distress.  Skin: Skin is warm and dry.  Psychiatric: He has a normal mood and affect. His behavior is normal.  Nursing note and vitals reviewed.   Results for orders placed or performed in visit on 07/31/17  CBC with Differential/Platelet  Result Value Ref Range   WBC 7.5 3.4 - 10.8 x10E3/uL   RBC 4.29 4.14 - 5.80 x10E6/uL   Hemoglobin 13.9 13.0 - 17.7 g/dL   Hematocrit 41.5 37.5 - 51.0 %   MCV 97 79 - 97 fL   MCH 32.4 26.6 - 33.0 pg   MCHC 33.5 31.5 - 35.7 g/dL   RDW 13.3 12.3 - 15.4 %   Platelets 274 150 - 379 x10E3/uL   Neutrophils 74 Not Estab. %   Lymphs 12 Not Estab. %   Monocytes 9 Not Estab. %   Eos 3 Not Estab. %   Basos 1 Not Estab. %   Neutrophils Absolute 5.7 1.4 - 7.0 x10E3/uL   Lymphocytes Absolute 0.9 0.7 - 3.1 x10E3/uL   Monocytes Absolute 0.7 0.1 - 0.9 x10E3/uL   EOS (ABSOLUTE) 0.2 0.0 - 0.4 x10E3/uL   Basophils Absolute 0.1 0.0 - 0.2 x10E3/uL   Immature Granulocytes 1 Not Estab. %   Immature Grans (Abs) 0.1 0.0 - 0.1 x10E3/uL  CMP14+EGFR  Result Value Ref Range   Glucose 145 (H) 65 - 99 mg/dL   BUN 28 (H) 6 - 24 mg/dL   Creatinine, Ser 1.21 0.76 - 1.27 mg/dL   GFR calc non Af Amer 67 >59 mL/min/1.73   GFR calc Af Amer 77 >59 mL/min/1.73   BUN/Creatinine Ratio 23 (H) 9 - 20   Sodium 138 134 - 144 mmol/L   Potassium 4.9 3.5 - 5.2 mmol/L   Chloride 98 96 - 106 mmol/L   CO2 26 20 - 29 mmol/L   Calcium 9.2 8.7 - 10.2 mg/dL   Total Protein 6.9 6.0 - 8.5 g/dL   Albumin 4.0 3.5 - 5.5 g/dL   Globulin, Total 2.9 1.5 - 4.5 g/dL   Albumin/Globulin Ratio 1.4 1.2 -  2.2   Bilirubin Total 0.4 0.0 - 1.2 mg/dL   Alkaline Phosphatase 64 39 - 117 IU/L   AST 21 0 - 40 IU/L   ALT 22 0 - 44 IU/L  Lipid panel  Result Value Ref Range   Cholesterol, Total 179 100 - 199 mg/dL  Triglycerides 118 0 - 149 mg/dL   HDL 39 (L) >39 mg/dL   VLDL Cholesterol Cal 24 5 - 40 mg/dL   LDL Calculated 116 (H) 0 - 99 mg/dL   Chol/HDL Ratio 4.6 0.0 - 5.0 ratio      Assessment & Plan:   1. Prediabetes - CBC with Differential/Platelet - CMP14+EGFR - Lipid panel  2. Essential hypertension - CBC with Differential/Platelet - CMP14+EGFR - Lipid panel - lisinopril-hydrochlorothiazide (PRINZIDE,ZESTORETIC) 20-25 MG tablet; Take 1 tablet by mouth daily.  Dispense: 90 tablet; Refill: 3  3. Severe obesity (BMI >= 40) ([facility]) - CBC with Differential/Platelet - CMP14+EGFR - Lipid panel  4. Chronic pain of both knees - methylPREDNISolone acetate (DEPO-MEDROL) injection 40 mg  5. Pain of both hip joints - methylPREDNISolone acetate (DEPO-MEDROL) injection 40 mg  6. Chronic low back pain, unspecified back pain laterality, with sciatica presence unspecified - oxyCODONE-acetaminophen (PERCOCET) 10-325 MG tablet; Take 1 tablet by mouth 2 (two) times daily.  Dispense: 60 tablet; Refill: 0   Continue all other maintenance medications as listed above.  Follow up plan: Return in about 3 months (around 10/31/2017) for recheck.  Educational handout given for [place] PA-C [place] [address]  [place], [place] [address] [phone]   08/01/2017, 9:47 AM

## 2017-08-07 ENCOUNTER — Telehealth: Payer: Self-pay | Admitting: Gastroenterology

## 2017-08-07 NOTE — Telephone Encounter (Signed)
Patient was advised TCS in 11/2017 due to number of polyps and inadequate bowel prep back in 05/2017.   Patient had requested to postpone TCS until early 2020.   Per RMR, would not advise waiting past 03/2018.   Please let patient know. He will need an OV 02/2018 to schedule TCS for 03/2018 (please NIC for a couple of month before those dates so he will actually be seen in 02/2018).

## 2017-08-08 NOTE — Telephone Encounter (Signed)
Lmom, waiting on a return call.  

## 2017-08-17 NOTE — Telephone Encounter (Signed)
Pt notified and is ok with coming in 02/2018. Please NIC for a couple of months before those dates, so pt will be seen 02/2018.

## 2017-08-26 ENCOUNTER — Telehealth: Payer: Self-pay | Admitting: Physician Assistant

## 2017-08-26 NOTE — Telephone Encounter (Signed)
Please review and advise.

## 2017-08-27 ENCOUNTER — Other Ambulatory Visit: Payer: Self-pay | Admitting: Physician Assistant

## 2017-08-27 MED ORDER — TERBINAFINE HCL 1 % EX CREA: 1.0000 "application " | TOPICAL_CREAM | Freq: Two times a day (BID) | CUTANEOUS | 5 refills | Status: DC | PRN

## 2017-08-27 NOTE — Telephone Encounter (Signed)
sent 

## 2017-08-28 NOTE — Telephone Encounter (Signed)
Detailed message left that rx sent to pharmacy. ?

## 2017-11-01 ENCOUNTER — Encounter: Payer: Self-pay | Admitting: Physician Assistant

## 2017-11-01 ENCOUNTER — Ambulatory Visit: Payer: Medicaid Other | Admitting: Physician Assistant

## 2017-11-01 VITALS — BP 141/85 | HR 62 | Temp 97.4°F | Ht 71.0 in | Wt >= 6400 oz

## 2017-11-01 DIAGNOSIS — M545 Low back pain: Secondary | ICD-10-CM

## 2017-11-01 DIAGNOSIS — M67912 Unspecified disorder of synovium and tendon, left shoulder: Secondary | ICD-10-CM | POA: Diagnosis not present

## 2017-11-01 DIAGNOSIS — B372 Candidiasis of skin and nail: Secondary | ICD-10-CM | POA: Diagnosis not present

## 2017-11-01 DIAGNOSIS — S4991XA Unspecified injury of right shoulder and upper arm, initial encounter: Secondary | ICD-10-CM | POA: Diagnosis not present

## 2017-11-01 DIAGNOSIS — G8929 Other chronic pain: Secondary | ICD-10-CM | POA: Diagnosis not present

## 2017-11-01 MED ORDER — OXYCODONE-ACETAMINOPHEN 10-325 MG PO TABS: 1.0000 | ORAL_TABLET | Freq: Three times a day (TID) | ORAL | 0 refills | Status: DC | PRN

## 2017-11-01 MED ORDER — FLUCONAZOLE 150 MG PO TABS: 150.0000 mg | ORAL_TABLET | Freq: Every day | ORAL | 5 refills | Status: DC

## 2017-11-01 MED ORDER — OXYCODONE-ACETAMINOPHEN 10-325 MG PO TABS: 1.0000 | ORAL_TABLET | Freq: Two times a day (BID) | ORAL | 0 refills | Status: DC

## 2017-11-01 MED ORDER — OXYCODONE-ACETAMINOPHEN 10-325 MG PO TABS
1.0000 | ORAL_TABLET | Freq: Two times a day (BID) | ORAL | 0 refills | Status: DC
Start: 2017-11-01 — End: 2018-02-01

## 2017-11-01 MED ORDER — METHYLPREDNISOLONE ACETATE 80 MG/ML IJ SUSP
80.0000 mg | Freq: Once | INTRAMUSCULAR | Status: AC
Start: 2017-11-01 — End: 2017-11-01
  Administered 2017-11-01: 80 mg via INTRAMUSCULAR

## 2017-11-01 NOTE — Patient Instructions (Signed)
Shoulder Exercises Ask your health care provider which exercises are safe for you. Do exercises exactly as told by your health care provider and adjust them as directed. It is normal to feel mild stretching, pulling, tightness, or discomfort as you do these exercises, but you should stop right away if you feel sudden pain or your pain gets worse.Do not begin these exercises until told by your health care provider. RANGE OF MOTION EXERCISES These exercises warm up your muscles and joints and improve the movement and flexibility of your shoulder. These exercises also help to relieve pain, numbness, and tingling. These exercises involve stretching your injured shoulder directly. Exercise A: Pendulum  1. Stand near a wall or a surface that you can hold onto for balance. 2. Bend at the waist and let your left / right arm hang straight down. Use your other arm to support you. Keep your back straight and do not lock your knees. 3. Relax your left / right arm and shoulder muscles, and move your hips and your trunk so your left / right arm swings freely. Your arm should swing because of the motion of your body, not because you are using your arm or shoulder muscles. 4. Keep moving your body so your arm swings in the following directions, as told by your health care provider: ? Side to side. ? Forward and backward. ? In clockwise and counterclockwise circles. 5. Continue each motion for __________ seconds, or for as long as told by your health care provider. 6. Slowly return to the starting position. Repeat __________ times. Complete this exercise __________ times a day. Exercise B:Flexion, Standing  1. Stand and hold a broomstick, a cane, or a similar object. Place your hands a little more than shoulder-width apart on the object. Your left / right hand should be palm-up, and your other hand should be palm-down. 2. Keep your elbow straight and keep your shoulder muscles relaxed. Push the stick down with  your healthy arm to raise your left / right arm in front of your body, and then over your head until you feel a stretch in your shoulder. ? Avoid shrugging your shoulder while you raise your arm. Keep your shoulder blade tucked down toward the middle of your back. 3. Hold for __________ seconds. 4. Slowly return to the starting position. Repeat __________ times. Complete this exercise __________ times a day. Exercise C: Abduction, Standing 1. Stand and hold a broomstick, a cane, or a similar object. Place your hands a little more than shoulder-width apart on the object. Your left / right hand should be palm-up, and your other hand should be palm-down. 2. While keeping your elbow straight and your shoulder muscles relaxed, push the stick across your body toward your left / right side. Raise your left / right arm to the side of your body and then over your head until you feel a stretch in your shoulder. ? Do not raise your arm above shoulder height, unless your health care provider tells you to do that. ? Avoid shrugging your shoulder while you raise your arm. Keep your shoulder blade tucked down toward the middle of your back. 3. Hold for __________ seconds. 4. Slowly return to the starting position. Repeat __________ times. Complete this exercise __________ times a day. Exercise D:Internal Rotation  1. Place your left / right hand behind your back, palm-up. 2. Use your other hand to dangle an exercise band, a towel, or a similar object over your shoulder. Grasp the band with   your left / right hand so you are holding onto both ends. 3. Gently pull up on the band until you feel a stretch in the front of your left / right shoulder. ? Avoid shrugging your shoulder while you raise your arm. Keep your shoulder blade tucked down toward the middle of your back. 4. Hold for __________ seconds. 5. Release the stretch by letting go of the band and lowering your hands. Repeat __________ times. Complete  this exercise __________ times a day. STRETCHING EXERCISES These exercises warm up your muscles and joints and improve the movement and flexibility of your shoulder. These exercises also help to relieve pain, numbness, and tingling. These exercises are done using your healthy shoulder to help stretch the muscles of your injured shoulder. Exercise E: Corner Stretch (External Rotation and Abduction)  1. Stand in a doorway with one of your feet slightly in front of the other. This is called a staggered stance. If you cannot reach your forearms to the door frame, stand facing a corner of a room. 2. Choose one of the following positions as told by your health care provider: ? Place your hands and forearms on the door frame above your head. ? Place your hands and forearms on the door frame at the height of your head. ? Place your hands on the door frame at the height of your elbows. 3. Slowly move your weight onto your front foot until you feel a stretch across your chest and in the front of your shoulders. Keep your head and chest upright and keep your abdominal muscles tight. 4. Hold for __________ seconds. 5. To release the stretch, shift your weight to your back foot. Repeat __________ times. Complete this stretch __________ times a day. Exercise F:Extension, Standing 1. Stand and hold a broomstick, a cane, or a similar object behind your back. ? Your hands should be a little wider than shoulder-width apart. ? Your palms should face away from your back. 2. Keeping your elbows straight and keeping your shoulder muscles relaxed, move the stick away from your body until you feel a stretch in your shoulder. ? Avoid shrugging your shoulders while you move the stick. Keep your shoulder blade tucked down toward the middle of your back. 3. Hold for __________ seconds. 4. Slowly return to the starting position. Repeat __________ times. Complete this exercise __________ times a day. STRENGTHENING  EXERCISES These exercises build strength and endurance in your shoulder. Endurance is the ability to use your muscles for a long time, even after they get tired. Exercise G:External Rotation  1. Sit in a stable chair without armrests. 2. Secure an exercise band at elbow height on your left / right side. 3. Place a soft object, such as a folded towel or a small pillow, between your left / right upper arm and your body to move your elbow a few inches away (about 10 cm) from your side. 4. Hold the end of the band so it is tight and there is no slack. 5. Keeping your elbow pressed against the soft object, move your left / right forearm out, away from your abdomen. Keep your body steady so only your forearm moves. 6. Hold for __________ seconds. 7. Slowly return to the starting position. Repeat __________ times. Complete this exercise __________ times a day. Exercise H:Shoulder Abduction  1. Sit in a stable chair without armrests, or stand. 2. Hold a __________ weight in your left / right hand, or hold an exercise band with both hands.   3. Start with your arms straight down and your left / right palm facing in, toward your body. 4. Slowly lift your left / right hand out to your side. Do not lift your hand above shoulder height unless your health care provider tells you that this is safe. ? Keep your arms straight. ? Avoid shrugging your shoulder while you do this movement. Keep your shoulder blade tucked down toward the middle of your back. 5. Hold for __________ seconds. 6. Slowly lower your arm, and return to the starting position. Repeat __________ times. Complete this exercise __________ times a day. Exercise I:Shoulder Extension 1. Sit in a stable chair without armrests, or stand. 2. Secure an exercise band to a stable object in front of you where it is at shoulder height. 3. Hold one end of the exercise band in each hand. Your palms should face each other. 4. Straighten your elbows and  lift your hands up to shoulder height. 5. Step back, away from the secured end of the exercise band, until the band is tight and there is no slack. 6. Squeeze your shoulder blades together as you pull your hands down to the sides of your thighs. Stop when your hands are straight down by your sides. Do not let your hands go behind your body. 7. Hold for __________ seconds. 8. Slowly return to the starting position. Repeat __________ times. Complete this exercise __________ times a day. Exercise J:Standing Shoulder Row 1. Sit in a stable chair without armrests, or stand. 2. Secure an exercise band to a stable object in front of you so it is at waist height. 3. Hold one end of the exercise band in each hand. Your palms should be in a thumbs-up position. 4. Bend each of your elbows to an "L" shape (about 90 degrees) and keep your upper arms at your sides. 5. Step back until the band is tight and there is no slack. 6. Slowly pull your elbows back behind you. 7. Hold for __________ seconds. 8. Slowly return to the starting position. Repeat __________ times. Complete this exercise __________ times a day. Exercise K:Shoulder Press-Ups  1. Sit in a stable chair that has armrests. Sit upright, with your feet flat on the floor. 2. Put your hands on the armrests so your elbows are bent and your fingers are pointing forward. Your hands should be about even with the sides of your body. 3. Push down on the armrests and use your arms to lift yourself off of the chair. Straighten your elbows and lift yourself up as much as you comfortably can. ? Move your shoulder blades down, and avoid letting your shoulders move up toward your ears. ? Keep your feet on the ground. As you get stronger, your feet should support less of your body weight as you lift yourself up. 4. Hold for __________ seconds. 5. Slowly lower yourself back into the chair. Repeat __________ times. Complete this exercise __________ times a  day. Exercise L: Wall Push-Ups  1. Stand so you are facing a stable wall. Your feet should be about one arm-length away from the wall. 2. Lean forward and place your palms on the wall at shoulder height. 3. Keep your feet flat on the floor as you bend your elbows and lean forward toward the wall. 4. Hold for __________ seconds. 5. Straighten your elbows to push yourself back to the starting position. Repeat __________ times. Complete this exercise __________ times a day. This information is not intended to replace advice   given to you by your health care provider. Make sure you discuss any questions you have with your health care provider. Document Released: 01/26/2005 Document Revised: 12/07/2015 Document Reviewed: 11/23/2014 [employer] Interactive Patient Education  2018 [employer]  

## 2017-11-02 NOTE — Progress Notes (Signed)
BP (!) 141/85   Pulse 62   Temp (!) 97.4 F (36.3 C) (Oral)   Ht 5\' 11"  (1.803 m)   Wt (!) 409 lb 9.6 oz (185.8 kg)   BMI 57.13 kg/m    Subjective:    Patient ID: [name], male    DOB: 13-Nov-1960, 57 y.o.   MRN: [id number]  HPI: CARMAN ESSICK is a 57 y.o. male presenting on 11/01/2017 for Hypertension (3 month follow up )  This patient comes in for periodic recheck on medications and conditions including his chronic low back pain, recent injury to his shoulder that is not healing.  He has had problems with the past on the right.  This feels very much the same on the right.  It is most bothersome and he has flexion to the front and lateral portion.  He can only get it up to 90 degrees.  He also is having yeast in his groin again.  He has not been able to have it improved with the Lamisil cream..   All medications are reviewed today. There are no reports of any problems with the medications. All of the medical conditions are reviewed and updated.  Lab work is reviewed and will be ordered as medically necessary. There are no new problems reported with today's visit.   Past Medical History:  Diagnosis Date  . Chronic back pain   . Essential hypertension   . Morbid obesity ([place])   . Panic disorder   . Prediabetes   . Shoulder pain, left    Following fall   Relevant past medical, surgical, family and social history reviewed and updated as indicated. Interim medical history since our last visit reviewed. Allergies and medications reviewed and updated. DATA REVIEWED: CHART IN EPIC  Family History reviewed for pertinent findings.  Review of Systems  Constitutional: Negative.  Negative for appetite change and fatigue.  Eyes: Negative for pain and visual disturbance.  Respiratory: Negative.  Negative for cough, chest tightness, shortness of breath and wheezing.   Cardiovascular: Negative.  Negative for chest pain, palpitations and leg swelling.  Gastrointestinal: Negative.   Negative for abdominal pain, diarrhea, nausea and vomiting.  Genitourinary: Negative.   Musculoskeletal: Positive for arthralgias and back pain.  Skin: Positive for rash. Negative for color change.  Neurological: Negative.  Negative for weakness, numbness and headaches.  Psychiatric/Behavioral: Negative.     Allergies as of 11/01/2017      Reactions   Ibuprofen Other (See Comments)   Rectal bleed   Losartan Rash      Medication List        Accurate as of 11/01/17 11:59 PM. Always use your most recent med list.          bisoprolol-hydrochlorothiazide 10-6.25 MG tablet Commonly known as:  ZIAC Take 1 tablet by mouth daily.   cyclobenzaprine 10 MG tablet Commonly known as:  FLEXERIL TAKE 1 TABLET BY MOUTH THREE TIMES DAILY AS NEEDED   fluconazole 150 MG tablet Commonly known as:  DIFLUCAN Take 1 tablet (150 mg total) by mouth daily.   lisinopril-hydrochlorothiazide 20-25 MG tablet Commonly known as:  PRINZIDE,ZESTORETIC Take 1 tablet by mouth daily.   oxyCODONE-acetaminophen 10-325 MG tablet Commonly known as:  PERCOCET Take 1 tablet by mouth 2 (two) times daily.   oxyCODONE-acetaminophen 10-325 MG tablet Commonly known as:  PERCOCET Take 1 tablet by mouth every 8 (eight) hours as needed for pain.   oxyCODONE-acetaminophen 10-325 MG tablet Commonly known as:  PERCOCET  Take 1 tablet by mouth 2 (two) times daily.   terbinafine 1 % cream Commonly known as:  LAMISIL Apply 1 application topically 2 (two) times daily as needed (FOR ITCHY SKIN.).          Objective:    BP (!) 141/85   Pulse 62   Temp (!) 97.4 F (36.3 C) (Oral)   Ht 5\' 11"  (1.803 m)   Wt (!) 409 lb 9.6 oz (185.8 kg)   BMI 57.13 kg/m   Allergies  Allergen Reactions  . Ibuprofen Other (See Comments)    Rectal bleed  . Losartan Rash    Wt Readings from Last 3 Encounters:  11/01/17 (!) 409 lb 9.6 oz (185.8 kg)  07/31/17 (!) 417 lb 9.6 oz (189.4 kg)  07/26/17 (!) 413 lb 6.4 oz (187.5 kg)     Physical Exam  Constitutional: He appears well-developed and well-nourished. No distress.  HENT:  Head: Normocephalic and atraumatic.  Eyes: Pupils are equal, round, and reactive to light. Conjunctivae and EOM are normal.  Cardiovascular: Normal rate, regular rhythm and normal heart sounds.  Pulmonary/Chest: Effort normal and breath sounds normal. No respiratory distress.  Musculoskeletal:       Right shoulder: He exhibits decreased range of motion and tenderness. He exhibits no effusion, no crepitus and no deformity.  Skin: Skin is warm and dry.  Psychiatric: He has a normal mood and affect. His behavior is normal.  Nursing note and vitals reviewed.       Assessment & Plan:   1. Chronic low back pain, unspecified back pain laterality, with sciatica presence unspecified - oxyCODONE-acetaminophen (PERCOCET) 10-325 MG tablet; Take 1 tablet by mouth 2 (two) times daily.  Dispense: 60 tablet; Refill: 0 - oxyCODONE-acetaminophen (PERCOCET) 10-325 MG tablet; Take 1 tablet by mouth every 8 (eight) hours as needed for pain.  Dispense: 60 tablet; Refill: 0 - oxyCODONE-acetaminophen (PERCOCET) 10-325 MG tablet; Take 1 tablet by mouth 2 (two) times daily.  Dispense: 60 tablet; Refill: 0 - methylPREDNISolone acetate (DEPO-MEDROL) injection 80 mg  2. Injury of right shoulder, initial encounter - methylPREDNISolone acetate (DEPO-MEDROL) injection 80 mg  3. Disorder of left rotator cuff - oxyCODONE-acetaminophen (PERCOCET) 10-325 MG tablet; Take 1 tablet by mouth 2 (two) times daily.  Dispense: 60 tablet; Refill: 0  4. Candidal intertrigo - fluconazole (DIFLUCAN) 150 MG tablet; Take 1 tablet (150 mg total) by mouth daily.  Dispense: 4 tablet; Refill: 5   Continue all other maintenance medications as listed above.  Follow up plan: Return in about 3 months (around 02/01/2018) for recheck.  Educational handout given for [place] PA-C [facility] [address]  [place], [place] [address] [phone]   11/02/2017, 10:08 PM

## 2017-11-06 ENCOUNTER — Encounter: Payer: Self-pay | Admitting: Internal Medicine

## 2017-11-13 ENCOUNTER — Telehealth: Payer: Self-pay | Admitting: Physician Assistant

## 2017-11-13 NOTE — Telephone Encounter (Signed)
Patient is only able to pick up one week of Oxycodone at a time from [facility] because we have not sent in the prior approval for Medicaid that authorizes anything more.  Needs the form to be filled out and sent to pharmacy.

## 2017-11-13 NOTE — Telephone Encounter (Signed)
This goes to prior [place] dept

## 2017-11-15 NOTE — Telephone Encounter (Signed)
PA #[id number] W created on [employer]  LMOVM that PA has been started

## 2017-12-26 ENCOUNTER — Telehealth: Payer: Self-pay | Admitting: Internal Medicine

## 2017-12-26 MED ORDER — METRONIDAZOLE 500 MG PO TABS: 500.0000 mg | ORAL_TABLET | Freq: Three times a day (TID) | ORAL | 0 refills | Status: AC

## 2017-12-26 MED ORDER — CIPROFLOXACIN HCL 500 MG PO TABS: 500.0000 mg | ORAL_TABLET | Freq: Two times a day (BID) | ORAL | 0 refills | Status: AC

## 2017-12-26 NOTE — Telephone Encounter (Signed)
Pt called to make his follow up OV and asked for an antibiotic for his flare up of diverticulitis. He uses [facility]. [phone]

## 2017-12-26 NOTE — Telephone Encounter (Signed)
RX sent to [facility] drug.  Transition to soft diet until pain improved, low residue (low fiber) until pain improved.  Keep ov next month.  Call if ongoing symptoms.

## 2017-12-26 NOTE — Telephone Encounter (Signed)
Lmom, waiting on a return call.  

## 2017-12-26 NOTE — Telephone Encounter (Signed)
Spoke with pt and he's having some abdomen pain on his left side that moves to his rt side. No nausea or vomiting. He feels he has some constipation and hasn't taken anything for it. His last BM was yesterday. Pt feels he's having a diverticulitis flare and would like Cipro and metronidazole sent into his pharmacy. Pt is aware that if his condition worsens and he has vomiting uncontrollable feels lightheaded ect. He should proceed to the ED.

## 2017-12-27 NOTE — Telephone Encounter (Signed)
Pt will pickup his RX and follow  A soft diet until improned.

## 2018-01-19 ENCOUNTER — Other Ambulatory Visit: Payer: Self-pay

## 2018-01-19 ENCOUNTER — Inpatient Hospital Stay (HOSPITAL_COMMUNITY)
Admission: EM | Admit: 2018-01-19 | Discharge: 2018-01-23 | DRG: 281 | Disposition: A | Discharge status: Home / Self Care | Payer: Medicaid Other | Attending: Internal Medicine | Admitting: Internal Medicine

## 2018-01-19 ENCOUNTER — Emergency Department (HOSPITAL_COMMUNITY): Payer: Medicaid Other

## 2018-01-19 ENCOUNTER — Encounter (HOSPITAL_COMMUNITY): Payer: Self-pay | Admitting: Emergency Medicine

## 2018-01-19 ENCOUNTER — Ambulatory Visit: Payer: Medicaid Other | Admitting: Family Medicine

## 2018-01-19 ENCOUNTER — Encounter: Payer: Self-pay | Admitting: Family Medicine

## 2018-01-19 VITALS — BP 105/79 | HR 77 | Temp 97.1°F | Ht 71.0 in | Wt 398.0 lb

## 2018-01-19 DIAGNOSIS — Z886 Allergy status to analgesic agent status: Secondary | ICD-10-CM | POA: Diagnosis not present

## 2018-01-19 DIAGNOSIS — E1159 Type 2 diabetes mellitus with other circulatory complications: Secondary | ICD-10-CM | POA: Diagnosis present

## 2018-01-19 DIAGNOSIS — E876 Hypokalemia: Secondary | ICD-10-CM | POA: Diagnosis not present

## 2018-01-19 DIAGNOSIS — M549 Dorsalgia, unspecified: Secondary | ICD-10-CM | POA: Diagnosis not present

## 2018-01-19 DIAGNOSIS — Z79899 Other long term (current) drug therapy: Secondary | ICD-10-CM | POA: Diagnosis not present

## 2018-01-19 DIAGNOSIS — E872 Acidosis, unspecified: Secondary | ICD-10-CM | POA: Diagnosis present

## 2018-01-19 DIAGNOSIS — E860 Dehydration: Secondary | ICD-10-CM | POA: Diagnosis not present

## 2018-01-19 DIAGNOSIS — R197 Diarrhea, unspecified: Secondary | ICD-10-CM | POA: Diagnosis present

## 2018-01-19 DIAGNOSIS — G8929 Other chronic pain: Secondary | ICD-10-CM | POA: Diagnosis present

## 2018-01-19 DIAGNOSIS — R1032 Left lower quadrant pain: Secondary | ICD-10-CM

## 2018-01-19 DIAGNOSIS — N179 Acute kidney failure, unspecified: Secondary | ICD-10-CM | POA: Diagnosis present

## 2018-01-19 DIAGNOSIS — M545 Low back pain: Secondary | ICD-10-CM

## 2018-01-19 DIAGNOSIS — Z6841 Body Mass Index (BMI) 40.0 and over, adult: Secondary | ICD-10-CM | POA: Diagnosis not present

## 2018-01-19 DIAGNOSIS — I214 Non-ST elevation (NSTEMI) myocardial infarction: Secondary | ICD-10-CM | POA: Diagnosis not present

## 2018-01-19 DIAGNOSIS — Z888 Allergy status to other drugs, medicaments and biological substances status: Secondary | ICD-10-CM

## 2018-01-19 DIAGNOSIS — I129 Hypertensive chronic kidney disease with stage 1 through stage 4 chronic kidney disease, or unspecified chronic kidney disease: Secondary | ICD-10-CM | POA: Diagnosis not present

## 2018-01-19 DIAGNOSIS — I10 Essential (primary) hypertension: Secondary | ICD-10-CM | POA: Diagnosis not present

## 2018-01-19 DIAGNOSIS — N183 Chronic kidney disease, stage 3 unspecified: Secondary | ICD-10-CM

## 2018-01-19 DIAGNOSIS — K8020 Calculus of gallbladder without cholecystitis without obstruction: Secondary | ICD-10-CM | POA: Diagnosis not present

## 2018-01-19 DIAGNOSIS — I493 Ventricular premature depolarization: Secondary | ICD-10-CM | POA: Diagnosis present

## 2018-01-19 DIAGNOSIS — R7303 Prediabetes: Secondary | ICD-10-CM | POA: Diagnosis not present

## 2018-01-19 DIAGNOSIS — Z981 Arthrodesis status: Secondary | ICD-10-CM | POA: Diagnosis not present

## 2018-01-19 DIAGNOSIS — R7302 Impaired glucose tolerance (oral): Secondary | ICD-10-CM | POA: Diagnosis not present

## 2018-01-19 DIAGNOSIS — R7989 Other specified abnormal findings of blood chemistry: Secondary | ICD-10-CM | POA: Diagnosis not present

## 2018-01-19 DIAGNOSIS — I361 Nonrheumatic tricuspid (valve) insufficiency: Secondary | ICD-10-CM | POA: Diagnosis not present

## 2018-01-19 DIAGNOSIS — Z79891 Long term (current) use of opiate analgesic: Secondary | ICD-10-CM | POA: Diagnosis not present

## 2018-01-19 DIAGNOSIS — N189 Chronic kidney disease, unspecified: Secondary | ICD-10-CM | POA: Diagnosis not present

## 2018-01-19 DIAGNOSIS — I2721 Secondary pulmonary arterial hypertension: Secondary | ICD-10-CM | POA: Diagnosis not present

## 2018-01-19 DIAGNOSIS — I152 Hypertension secondary to endocrine disorders: Secondary | ICD-10-CM | POA: Diagnosis present

## 2018-01-19 DIAGNOSIS — R0602 Shortness of breath: Secondary | ICD-10-CM | POA: Diagnosis not present

## 2018-01-19 DIAGNOSIS — K760 Fatty (change of) liver, not elsewhere classified: Secondary | ICD-10-CM | POA: Diagnosis not present

## 2018-01-19 DIAGNOSIS — K5730 Diverticulosis of large intestine without perforation or abscess without bleeding: Secondary | ICD-10-CM | POA: Diagnosis not present

## 2018-01-19 LAB — CBC WITH DIFFERENTIAL/PLATELET
Abs Immature Granulocytes: 0.07 10*3/uL (ref 0.00–0.07)
Basophils Absolute: 0.1 10*3/uL (ref 0.0–0.1)
Basophils Relative: 1 %
EOS ABS: 0.1 10*3/uL (ref 0.0–0.5)
Eosinophils Relative: 1 %
HEMATOCRIT: 41.1 % (ref 39.0–52.0)
HEMOGLOBIN: 13.7 g/dL (ref 13.0–17.0)
Immature Granulocytes: 1 %
LYMPHS PCT: 9 %
Lymphs Abs: 1 10*3/uL (ref 0.7–4.0)
MCH: 32.2 pg (ref 26.0–34.0)
MCHC: 33.3 g/dL (ref 30.0–36.0)
MCV: 96.7 fL (ref 80.0–100.0)
Monocytes Absolute: 0.9 10*3/uL (ref 0.1–1.0)
Monocytes Relative: 8 %
Neutro Abs: 9.4 10*3/uL — ABNORMAL HIGH (ref 1.7–7.7)
Neutrophils Relative %: 80 %
Platelets: 257 10*3/uL (ref 150–400)
RBC: 4.25 MIL/uL (ref 4.22–5.81)
RDW: 12.6 % (ref 11.5–15.5)
WBC: 11.6 10*3/uL — AB (ref 4.0–10.5)
nRBC: 0 % (ref 0.0–0.2)

## 2018-01-19 LAB — TROPONIN I
TROPONIN I: 0.33 ng/mL — AB (ref <0.03)
Troponin I: 0.32 ng/mL (ref <0.03)
Troponin I: 0.31 ng/mL (ref <0.03)

## 2018-01-19 LAB — COMPREHENSIVE METABOLIC PANEL
ALT: 29 U/L (ref 0–44)
ANION GAP: 10 (ref 5–15)
AST: 30 U/L (ref 15–41)
Albumin: 3.7 g/dL (ref 3.5–5.0)
Alkaline Phosphatase: 55 U/L (ref 38–126)
BILIRUBIN TOTAL: 0.8 mg/dL (ref 0.3–1.2)
BUN: 22 mg/dL — AB (ref 6–20)
CO2: 26 mmol/L (ref 22–32)
Calcium: 8.8 mg/dL — ABNORMAL LOW (ref 8.9–10.3)
Chloride: 100 mmol/L (ref 98–111)
Creatinine, Ser: 1.62 mg/dL — ABNORMAL HIGH (ref 0.61–1.24)
GFR calc Af Amer: 53 mL/min — ABNORMAL LOW (ref >60)
GFR calc non Af Amer: 46 mL/min — ABNORMAL LOW (ref >60)
Glucose, Bld: 156 mg/dL — ABNORMAL HIGH (ref 70–99)
POTASSIUM: 3.3 mmol/L — AB (ref 3.5–5.1)
Sodium: 136 mmol/L (ref 135–145)
TOTAL PROTEIN: 7.5 g/dL (ref 6.5–8.1)

## 2018-01-19 LAB — URINALYSIS, ROUTINE W REFLEX MICROSCOPIC
Bilirubin Urine: NEGATIVE
Glucose, UA: NEGATIVE mg/dL
Hgb urine dipstick: NEGATIVE
KETONES UR: NEGATIVE mg/dL
Leukocytes, UA: NEGATIVE
Nitrite: NEGATIVE
Protein, ur: NEGATIVE mg/dL
Specific Gravity, Urine: 1.036 — ABNORMAL HIGH (ref 1.005–1.030)
pH: 5 (ref 5.0–8.0)

## 2018-01-19 LAB — LIPASE, BLOOD: Lipase: 31 U/L (ref 11–51)

## 2018-01-19 LAB — LACTIC ACID, PLASMA
Lactic Acid, Venous: 3.9 mmol/L (ref 0.5–1.9)
Lactic Acid, Venous: 1.4 mmol/L (ref 0.5–1.9)

## 2018-01-19 LAB — MAGNESIUM: Magnesium: 2 mg/dL (ref 1.7–2.4)

## 2018-01-19 MED ORDER — HEPARIN (PORCINE) IN NACL 100-0.45 UNIT/ML-% IJ SOLN
2400.0000 [IU]/h | INTRAMUSCULAR | Status: DC
  Administered 2018-01-19: 1500 [IU]/h via INTRAVENOUS
  Administered 2018-01-20 (×2): 2000 [IU]/h via INTRAVENOUS
  Administered 2018-01-20: 1800 [IU]/h via INTRAVENOUS
  Administered 2018-01-21 – 2018-01-23 (×4): 2400 [IU]/h via INTRAVENOUS
  Filled 2018-01-19 (×8): qty 250

## 2018-01-19 MED ORDER — SODIUM CHLORIDE 0.9 % IV BOLUS
1000.0000 mL | Freq: Once | INTRAVENOUS | Status: AC
  Administered 2018-01-19: 1000 mL via INTRAVENOUS

## 2018-01-19 MED ORDER — SODIUM CHLORIDE 0.9 % IV BOLUS
500.0000 mL | Freq: Once | INTRAVENOUS | Status: AC
  Administered 2018-01-19: 500 mL via INTRAVENOUS

## 2018-01-19 MED ORDER — SODIUM CHLORIDE 0.9 % IV SOLN
INTRAVENOUS | Status: DC
  Administered 2018-01-20 – 2018-01-22 (×6): via INTRAVENOUS

## 2018-01-19 MED ORDER — IOPAMIDOL (ISOVUE-300) INJECTION 61%
100.0000 mL | Freq: Once | INTRAVENOUS | Status: AC | PRN
  Administered 2018-01-19: 100 mL via INTRAVENOUS

## 2018-01-19 MED ORDER — HEPARIN BOLUS VIA INFUSION
4000.0000 [IU] | Freq: Once | INTRAVENOUS | Status: AC
  Administered 2018-01-19: 4000 [IU] via INTRAVENOUS

## 2018-01-19 MED ORDER — POTASSIUM CHLORIDE CRYS ER 20 MEQ PO TBCR
40.0000 meq | EXTENDED_RELEASE_TABLET | Freq: Once | ORAL | Status: AC
  Administered 2018-01-19: 40 meq via ORAL
  Filled 2018-01-19: qty 2

## 2018-01-19 NOTE — ED Notes (Signed)
Date and time results received: 01/19/18 2014   Test: Lactic Acid Critical Value: 3.9  Name of Provider Notified: Thurnell Garbe, MD

## 2018-01-19 NOTE — ED Notes (Signed)
Patient assisted to restroom via wheelchair. States that he felt if he needed to have a bowel movement. Patient able to void at this time. Patient very short winded with activity.

## 2018-01-19 NOTE — Progress Notes (Signed)
ANTICOAGULATION CONSULT NOTE - Initial Consult  Pharmacy Consult for heparin Indication: ACS/STEMI  Allergies  Allergen Reactions  . Ibuprofen Other (See Comments)    Rectal bleed  . Diflucan [Fluconazole] Rash  . Losartan Rash    Patient Measurements: Height: 5\' 11"  (180.3 cm) Weight: (!) 398 lb (180.5 kg) IBW/kg (Calculated) : 75.3 Heparin Dosing Weight: 120 kg  Vital Signs: Temp: 97.7 F (36.5 C) (10/25 1732) Temp Source: Oral (10/25 1732) BP: 115/87 (10/25 1830) Pulse Rate: 74 (10/25 1830)  Labs: Recent Labs    01/19/18 1753  HGB 13.7  HCT 41.1  PLT 257  CREATININE 1.62*  TROPONINI 0.32*    Estimated Creatinine Clearance: 84.5 mL/min (A) (by C-G formula based on SCr of 1.62 mg/dL (H)).   Medical History: Past Medical History:  Diagnosis Date  . Chronic back pain   . Essential hypertension   . Morbid obesity ([place])   . Panic disorder   . Prediabetes   . Shoulder pain, left    Following fall    Medications:   (Not in a hospital admission)  Assessment: Pharmacy consulted to dose heparin for patient with ACS/STEMI.  Troponin on admission is 0.32.  Goal of Therapy:  Heparin level 0.3-0.7 units/ml Monitor platelets by anticoagulation protocol: Yes   Plan:  Give 4000 units bolus x 1 Start heparin infusion at 1500 units/hr Check anti-Xa level in 6 hours and daily while on heparin Continue to monitor H&H and platelets  Revonda Standard Anisia Leija 01/19/2018,7:46 PM

## 2018-01-19 NOTE — H&P (Signed)
History and Physical  Bradley Torres HGD:[id number] DOB: May 28, 1960 DOA: 01/19/2018  Referring physician: Dr Thurnell Garbe, ED physician PCP: Terald Sleeper, PA-C  Outpatient Specialists:   Patient Coming From: home  Chief Complaint: abdominal pain  HPI: Bradley Torres is a 57 y.o. male with a history of hypertension, chronic back pain, morbid obesity, prediabetes, diffuse diverticulosis.  Patient was sent here from his PCPs office.  At the beginning of the month, the patient called GI and was given a prescription for Cipro and Flagyl for presumed diverticulitis due to abdominal pain.  Patient took the antibiotics, although was only taking the Flagyl twice a day, but finished the prescription.  During this time, the patient's pain and loose stools improved.  2 days ago, the patient had very sharp lower left abdominal pain that was nonradiating.  The pain woke him up.  He had very loose, dark stools that, per his primary care physician's note, was mixed with pus.  At the same time, the patient had some severe aching left-sided chest pain with radiation into his shoulder that was accompanied with diaphoresis and shortness of breath.  After using the toilet, the patient returned to his bed and fell asleep.  When he awoke, his chest pain had resolved.  However, since that time the patient has continued to have shortness of breath that is worse with exertion of any type, including rolling over in bed.  His symptoms are improved with rest and he has minimal shortness of breath while at rest.  He currently does not have any chest pain.  Emergency Department Course: Troponin elevated to 0.32 with repeat of 0.33.  Lactic acid also elevated to 3.9.  White count 11.6.  Creatinine 1.62.  CT of the abdomen showed diverticulosis without any acute findings.  Review of Systems:   Pt denies any fevers, chills, nausea, vomiting, constipation, cough, wheezing, palpitations, headache, vision changes, lightheadedness,  dizziness, melena, rectal bleeding.  Review of systems are otherwise negative  Past Medical History:  Diagnosis Date  . Chronic back pain   . Essential hypertension   . Morbid obesity ([place])   . Panic disorder   . Prediabetes   . Shoulder pain, left    Following fall   Past Surgical History:  Procedure Laterality Date  . Ankle fracture sugery Left   . COLONOSCOPY WITH PROPOFOL N/A 05/29/2017   Procedure: COLONOSCOPY WITH PROPOFOL;  Surgeon: Daneil Dolin, MD;  Location: [facility];  Service: Endoscopy;  Laterality: N/A;  2:00pm  . LUMBAR SPINE SURGERY     fusion  . POLYPECTOMY  05/29/2017   Procedure: POLYPECTOMY;  Surgeon: Daneil Dolin, MD;  Location: [facility];  Service: Endoscopy;;  ascending colon polyp x5-cs transverse colon polyp x4- cs descending colon polyp x1- hs sigmoid colon polyp x1 -hs rectal polyp x1- hs  . TONSILLECTOMY     Social History:  reports that he has never smoked. He has never used smokeless tobacco. He reports that he has current or past drug history. Drug: Marijuana. He reports that he does not drink alcohol. Patient lives at home  Allergies  Allergen Reactions  . Ibuprofen Other (See Comments)    Rectal bleed  . Diflucan [Fluconazole] Rash  . Losartan Rash    Family History  Problem Relation Age of Onset  . Colon cancer Mother        died at age 24  . Heart attack Father   . Hepatitis Sister 8  Autoimmune  . Heart attack Paternal Grandfather       Prior to Admission medications   Medication Sig Start Date End Date Taking? Authorizing Provider  bisoprolol-hydrochlorothiazide (ZIAC) 10-6.25 MG tablet Take 1 tablet by mouth daily. 05/01/17  Yes Terald Sleeper, PA-C  ciprofloxacin (CIPRO) 500 MG tablet Take 500 mg by mouth 2 (two) times daily.   Yes [provider]  cyclobenzaprine (FLEXERIL) 10 MG tablet TAKE 1 TABLET BY MOUTH THREE TIMES DAILY AS NEEDED Patient taking differently: TAKE 1 TABLET BY MOUTH THREE TIMES  DAILY AS NEEDED FOR BACK SPASMS. 01/03/17  Yes Terald Sleeper, PA-C  lisinopril-hydrochlorothiazide (PRINZIDE,ZESTORETIC) 20-25 MG tablet Take 1 tablet by mouth daily. 07/31/17  Yes Terald Sleeper, PA-C  metroNIDAZOLE (FLAGYL) 500 MG tablet Take 500 tablets by mouth 3 (three) times daily.    Yes [provider]  oxyCODONE-acetaminophen (PERCOCET) 10-325 MG tablet Take 1 tablet by mouth every 8 (eight) hours as needed for pain. 11/01/17  Yes Terald Sleeper, PA-C  oxyCODONE-acetaminophen (PERCOCET) 10-325 MG tablet Take 1 tablet by mouth 2 (two) times daily. 11/01/17  Yes Terald Sleeper, PA-C  oxyCODONE-acetaminophen (PERCOCET) 10-325 MG tablet Take 1 tablet by mouth 2 (two) times daily. Patient not taking: Reported on 01/19/2018 11/01/17   Terald Sleeper, PA-C  terbinafine (LAMISIL) 1 % cream Apply 1 application topically 2 (two) times daily as needed (FOR ITCHY SKIN.). Patient not taking: Reported on 01/19/2018 08/27/17   Terald Sleeper, PA-C    Physical Exam: BP 113/80   Pulse 78   Temp 97.7 F (36.5 C) (Oral)   Resp 15   Ht 5\' 11"  (1.803 m)   Wt (!) 180.5 kg   SpO2 91%   BMI 55.51 kg/m   . General: Middle-aged Caucasian male. Awake and alert and oriented x3. No acute cardiopulmonary distress.  Marland Kitchen HEENT: Normocephalic atraumatic.  Right and left ears normal in appearance.  Pupils equal, round, reactive to light. Extraocular muscles are intact. Sclerae anicteric and noninjected.  Moist mucosal membranes. No mucosal lesions.  . Neck: Neck supple without lymphadenopathy. No carotid bruits. No masses palpated.  . Cardiovascular: Regular rate with normal S1-S2 sounds. No murmurs, rubs, gallops auscultated. No JVD.  Marland Kitchen Respiratory: Good respiratory effort with no wheezes, rales, rhonchi. Lungs clear to auscultation bilaterally.  No accessory muscle use. . Abdomen: Soft, mild abdominal tenderness without rebound. Nondistended. Active bowel sounds. No masses or hepatosplenomegaly  . Skin: No  rashes, lesions, or ulcerations.  Dry, warm to touch. 2+ dorsalis pedis and radial pulses. . Musculoskeletal: No calf or leg pain. All major joints not erythematous nontender.  No upper or lower joint deformation.  Good ROM.  No contractures  . Psychiatric: Intact judgment and insight. Pleasant and cooperative. . Neurologic: No focal neurological deficits. Strength is 5/5 and symmetric in upper and lower extremities.  Cranial nerves II through XII are grossly intact.           Labs on Admission: I have personally reviewed following labs and imaging studies  CBC: Recent Labs  Lab 01/19/18 1753  WBC 11.6*  NEUTROABS 9.4*  HGB 13.7  HCT 41.1  MCV 96.7  PLT 638   Basic Metabolic Panel: Recent Labs  Lab 01/19/18 1753 01/19/18 1918  NA 136  --   K 3.3*  --   CL 100  --   CO2 26  --   GLUCOSE 156*  --   BUN 22*  --   CREATININE  1.62*  --   CALCIUM 8.8*  --   MG  --  2.0   GFR: Estimated Creatinine Clearance: 84.5 mL/min (A) (by C-G formula based on SCr of 1.62 mg/dL (H)). Liver Function Tests: Recent Labs  Lab 01/19/18 1753  AST 30  ALT 29  ALKPHOS 55  BILITOT 0.8  PROT 7.5  ALBUMIN 3.7   Recent Labs  Lab 01/19/18 1753  LIPASE 31   No results for input(s): AMMONIA in the last 168 hours. Coagulation Profile: No results for input(s): INR, PROTIME in the last 168 hours. Cardiac Enzymes: Recent Labs  Lab 01/19/18 1753 01/19/18 1918  TROPONINI 0.32* 0.33*   BNP (last 3 results) No results for input(s): PROBNP in the last 8760 hours. HbA1C: No results for input(s): HGBA1C in the last 72 hours. CBG: No results for input(s): GLUCAP in the last 168 hours. Lipid Profile: No results for input(s): CHOL, HDL, LDLCALC, TRIG, CHOLHDL, LDLDIRECT in the last 72 hours. Thyroid Function Tests: No results for input(s): TSH, T4TOTAL, FREET4, T3FREE, THYROIDAB in the last 72 hours. Anemia Panel: No results for input(s): VITAMINB12, FOLATE, FERRITIN, TIBC, IRON,  RETICCTPCT in the last 72 hours. Urine analysis:    Component Value Date/Time   COLORURINE YELLOW 01/19/2018 1946   APPEARANCEUR CLEAR 01/19/2018 1946   LABSPEC 1.036 (H) 01/19/2018 1946   PHURINE 5.0 01/19/2018 1946   GLUCOSEU NEGATIVE 01/19/2018 1946   HGBUR NEGATIVE 01/19/2018 1946   [place] NEGATIVE 01/19/2018 1946   KETONESUR NEGATIVE 01/19/2018 1946   PROTEINUR NEGATIVE 01/19/2018 1946   NITRITE NEGATIVE 01/19/2018 1946   LEUKOCYTESUR NEGATIVE 01/19/2018 1946   Sepsis Labs: @LABRCNTIP (procalcitonin:4,lacticidven:4) )No results found for this or any previous visit (from the past 240 hour(s)).   Radiological Exams on Admission: Dg Chest 2 View  Result Date: 01/19/2018 CLINICAL DATA:  Shortness of Breath.  Hypertension. EXAM: CHEST - 2 VIEW COMPARISON:  Chest radiograph March 09, 2010; chest CT March 09, 2010 FINDINGS: There is no edema or consolidation. The heart size and pulmonary vascularity are normal. No adenopathy. No bone lesions. IMPRESSION: No edema or consolidation. Electronically Signed   By: Lowella Grip III M.D.   On: 01/19/2018 18:54   Ct Abdomen Pelvis W Contrast  Result Date: 01/19/2018 CLINICAL DATA:  Abdominal pain, unspecified.  Symptoms for 2 weeks. EXAM: CT ABDOMEN AND PELVIS WITH CONTRAST TECHNIQUE: Multidetector CT imaging of the abdomen and pelvis was performed using the standard protocol following bolus administration of intravenous contrast. CONTRAST:  114mL ISOVUE-300 IOPAMIDOL (ISOVUE-300) INJECTION 61% COMPARISON:  None. FINDINGS: Lower chest:  No contributory findings. Hepatobiliary: Hepatic steatosis.Layering gallstone. No evidence of biliary obstruction or inflammation. Pancreas: Unremarkable. Spleen: Unremarkable. Adrenals/Urinary Tract: Negative adrenals. 3.2 cm upper pole cyst on the right. Symmetric normal renal enhancement otherwise. No stone or hydronephrosis. Unremarkable bladder. Stomach/Bowel: No obstruction. No appendicitis.  Left colonic diverticulosis. Vascular/Lymphatic: No acute vascular abnormality. Atherosclerotic calcification. No mass or adenopathy. Reproductive:Prostate calcification. Other: No ascites or pneumoperitoneum. Musculoskeletal: No acute abnormalities. L4-5 PLIF with solid arthrodesis. IMPRESSION: 1. No acute finding. 2. Hepatic steatosis, cholelithiasis, and colonic diverticulosis. Electronically Signed   By: Monte Fantasia M.D.   On: 01/19/2018 19:18    EKG: Independently reviewed.  Sinus rhythm.  Diffuse inverted T waves throughout  Assessment/Plan: Principal Problem:   NSTEMI (non-ST elevated myocardial infarction) ([place]) Active Problems:   Essential hypertension   Back pain   Diarrhea   Lactic acidosis   Hypokalemia   Acute kidney injury superimposed on chronic kidney disease (  [place])    This patient was discussed with the ED physician, including pertinent vitals, physical exam findings, labs, and imaging.  We also discussed care given by the ED provider.  1. NSTEMI a. Heparin drip b. Admission to [facility] on telemetry c. Cardiology consulted in ED d. Per cardiology, no indication for urgent heart cath over the weekend unless patient becomes symptomatic or has uptrending troponins e. We will trend troponins f. Patient shortness of breath may be due to ongoing N STEMI, however, may need to obtain a CTA of chest to rule out PE due to the patient's extreme shortness of breath.  Because the patient is currently on heparin drip, there is no emergent need to rule this out, particularly as the patient has an acute kidney injury. 2. Diarrhea a. No evidence of diverticulitis b. Stool culture c. enteric precautions 3. Acute kidney injury superimposed on stage II chronic kidney disease a. Check IV fluids b. Repeat creatinine in the morning 4. Hypokalemia a. Place potassium b. Repeat potassium in the morning 5. Lactic acidosis a. IV fluids bolused b. Repeat creatinine pending 6. Back  pain a. Patient chronically on Percocet 7. Hypertension a. Hold antihypertensives and diuretics  DVT prophylaxis: Therapeutic heparin Consultants: Cardiology Code Status: Full code Family Communication: None Disposition Plan: Patient should be able to return home following stabilization   Truett Mainland, DO [facility] Pager [phone]  If 7PM-7AM, please contact night-coverage [email or web address] Password [email or web address]

## 2018-01-19 NOTE — ED Triage Notes (Signed)
Pt c/o of abdominal pain, diarrhea, sob and weakness x 2 weeks. No fever.  Sent from [place]

## 2018-01-19 NOTE — Progress Notes (Signed)
Subjective: CC: Divertuclitis PCP: Terald Sleeper, PA-C TDH:RCBULA L Baig is a 57 y.o. male presenting to clinic today for:  1. ?Diverticulitis Patient presenting with concern for diverticulitis.  He had a colonoscopy in March of this year which showed diverticulosis of the entire colon.  He was last treated for diverticulitis with Cipro/Flagyl by his gastroenterologist on 12/26/17 and in Feb 2019 prior to that.  He was instructed to call his gastroenterologist if symptoms did not improve.  He was also instructed to continue a soft diet until symptoms improved.  He comes in to office today and notes that symptoms seem to improve with the antibiotics but 2 days ago he got a very sharp pain in the left lower abdomen that woke him up.  He notes that when he went to defecate he had dark stool mixed with what appeared to be pus.  He reports feeling generally ill since that time such that he was unable to get out of the house or bathe for about a week.  He has pain in bilateral aspects of the lower abdomen with ambulation.  He denies any fevers.  He does report having felt like his chest was tight 2 days ago.  He thought this was a panic attack and did not seek care for it.   ROS: Per HPI  Allergies  Allergen Reactions  . Ibuprofen Other (See Comments)    Rectal bleed  . Diflucan [Fluconazole] Rash  . Losartan Rash   Past Medical History:  Diagnosis Date  . Chronic back pain   . Essential hypertension   . Morbid obesity ([place])   . Panic disorder   . Prediabetes   . Shoulder pain, left    Following fall    Current Outpatient Medications:  .  bisoprolol-hydrochlorothiazide (ZIAC) 10-6.25 MG tablet, Take 1 tablet by mouth daily., Disp: 90 tablet, Rfl: 3 .  cyclobenzaprine (FLEXERIL) 10 MG tablet, TAKE 1 TABLET BY MOUTH THREE TIMES DAILY AS NEEDED (Patient taking differently: TAKE 1 TABLET BY MOUTH THREE TIMES DAILY AS NEEDED FOR BACK SPASMS.), Disp: 90 tablet, Rfl: 0 .   lisinopril-hydrochlorothiazide (PRINZIDE,ZESTORETIC) 20-25 MG tablet, Take 1 tablet by mouth daily., Disp: 90 tablet, Rfl: 3 .  oxyCODONE-acetaminophen (PERCOCET) 10-325 MG tablet, Take 1 tablet by mouth 2 (two) times daily., Disp: 60 tablet, Rfl: 0 .  oxyCODONE-acetaminophen (PERCOCET) 10-325 MG tablet, Take 1 tablet by mouth every 8 (eight) hours as needed for pain., Disp: 60 tablet, Rfl: 0 .  oxyCODONE-acetaminophen (PERCOCET) 10-325 MG tablet, Take 1 tablet by mouth 2 (two) times daily., Disp: 60 tablet, Rfl: 0 .  terbinafine (LAMISIL) 1 % cream, Apply 1 application topically 2 (two) times daily as needed (FOR ITCHY SKIN.)., Disp: 30 g, Rfl: 5 Social History   Socioeconomic History  . Marital status: Single    Spouse name: Not on file  . Number of children: Not on file  . Years of education: Not on file  . Highest education level: Not on file  Occupational History  . Not on file  Social Needs  . Financial resource strain: Not on file  . Food insecurity:    Worry: Not on file    Inability: Not on file  . Transportation needs:    Medical: Not on file    Non-medical: Not on file  Tobacco Use  . Smoking status: Never Smoker  . Smokeless tobacco: Never Used  Substance and Sexual Activity  . Alcohol use: No  . Drug use: Yes  Types: Marijuana    Comment: occassional  . Sexual activity: Not Currently    Birth control/protection: None  Lifestyle  . Physical activity:    Days per week: Not on file    Minutes per session: Not on file  . Stress: Not on file  Relationships  . Social connections:    Talks on phone: Not on file    Gets together: Not on file    Attends religious service: Not on file    Active member of club or organization: Not on file    Attends meetings of clubs or organizations: Not on file    Relationship status: Not on file  . Intimate partner violence:    Fear of current or ex partner: Not on file    Emotionally abused: Not on file    Physically abused:  Not on file    Forced sexual activity: Not on file  Other Topics Concern  . Not on file  Social History Narrative  . Not on file   Family History  Problem Relation Age of Onset  . Colon cancer Mother        died at age 60  . Heart attack Father   . Hepatitis Sister 42       Autoimmune  . Heart attack Paternal Grandfather     Objective: Office vital signs reviewed. BP 105/79   Pulse 77   Temp (!) 97.1 F (36.2 C) (Oral)   Ht 5\' 11"  (1.803 m)   Wt (!) 398 lb (180.5 kg)   BMI 55.51 kg/m   Physical Examination:  General: Awake, alert, morbidly obese, appears ill, diaphoretic, No acute distress HEENT: Normal    Eyes: PERRLA, extraocular membranes intact, sclera white Cardio: regular rate and rhythm, S1S2 heard, no murmurs appreciated Pulm: clear to auscultation bilaterally, no wheezes, rhonchi or rales; normal work of breathing on room air GI: obese, soft, non-tender, non-distended, bowel sounds present but hypoactive x4, no hepatomegaly, no splenomegaly, no masses  Assessment/ Plan: 57 y.o. male   1. Left lower quadrant abdominal pain Patient is borderline hypotensive.  His first blood pressure was actually 98/76.  This was rechecked with a different cuff.  I do question relative hypotension, as his blood pressures tend to be 140/80s.  Given his reports of purulence in his stool, pain in the abdomen with ambulation that is suggestive of peritonitis despite use of Cipro Flagyl since 12/26/2017 I am highly concerned for a more significant infection in the abdomen.  I have recommended that he seek immediate medical attention the emergency department and recommended that he be transported by EMS.  He declined transport by EMS.  Patient has capacity and understands the risk of self transport to the hospital.  The [facility] triage nurse was notified of his condition and will be expecting his arrival.  Will likely need CT abd/pelvis, CBC, CMP and fluids.  Will cc chart to  gastroenterologist.    Janora Norlander, [place] [phone]

## 2018-01-19 NOTE — ED Notes (Signed)
Pt becomes SOB upon minimal exertion.

## 2018-01-19 NOTE — Patient Instructions (Signed)
Go immediately to the emergency department. I offer you transport to [facility] by EMS but you declined this.  We discussed the risks and you accepted the risks.  I am very concerned that you have a severe infection.  At minimum, you need to have a CAT scan of your belly and blood labs to further evaluate this.

## 2018-01-19 NOTE — ED Notes (Signed)
Patient assisted to restroom via wheelchair. Patient returned to room. Patient want to sit in wheelchair. States that he needs to get out of the bed. Patient states that he is anxious and ready to be moved to a room or transferred to [facility]. States that he will start yelling and cussing. Patient made aware that type of behavior is unacceptable. Explained to patient that as soon as a bed is available he will be sent. Reminded patient not to get up by himself.

## 2018-01-19 NOTE — ED Provider Notes (Signed)
[facility] EMERGENCY DEPARTMENT Provider Note   CSN: [id number] Arrival date & time: 01/19/18  1719     History   Chief Complaint Chief Complaint  Patient presents with  . Abdominal Pain    HPI Bradley Torres is a 57 y.o. male.   Abdominal Pain      Pt was seen at 1740.  Per pt, c/o gradual onset and worsening of persistent LLQ abd "pain" for the past 2 to 3 weeks, worse over the past several days. Has been associated with multiple intermittent episodes of "loose stools."  Describes the stools as "dark brown" and "yellow." Pt states 3 weeks ago he was rx cipro/flagyl for dx diverticulitis. Pt states he improved while he was on the antibiotics, but his symptoms returned and worsened. Pt states 2 days ago when he got up to go to the bathroom approximately 0500 to have a loose stool, he developed lightheadedness, diaphoresis, SOB, and chest "tightness" radiating into his left arm. Pt states he "laid down" until his symptoms went away approximately 1300.  Denies N/V, no fevers, no back pain, no rash, no CP/SOB, no black or blood in stools.      Past Medical History:  Diagnosis Date  . Chronic back pain   . Essential hypertension   . Morbid obesity ([place])   . Panic disorder   . Prediabetes   . Shoulder pain, left    Following fall    Patient Active Problem List   Diagnosis Date Noted  . Injury of right shoulder 11/01/2017  . Disorder of left rotator cuff 11/01/2017  . Candidal intertrigo 11/01/2017  . Hx of adenomatous colonic polyps 07/26/2017  . Family hx of colon cancer 05/03/2017  . LLQ pain 03/14/2017  . Bowel habit changes 03/14/2017  . Prediabetes 11/15/2016  . Essential hypertension 01/23/2014  . Back pain 01/23/2014  . Severe obesity (BMI >= 40) ([place]) 01/23/2014    Past Surgical History:  Procedure Laterality Date  . Ankle fracture sugery Left   . COLONOSCOPY WITH PROPOFOL N/A 05/29/2017   Procedure: COLONOSCOPY WITH PROPOFOL;  Surgeon: Daneil Dolin, MD;   Location: [facility] ENDO SUITE;  Service: Endoscopy;  Laterality: N/A;  2:00pm  . LUMBAR SPINE SURGERY     fusion  . POLYPECTOMY  05/29/2017   Procedure: POLYPECTOMY;  Surgeon: Daneil Dolin, MD;  Location: [facility] ENDO SUITE;  Service: Endoscopy;;  ascending colon polyp x5-cs transverse colon polyp x4- cs descending colon polyp x1- hs sigmoid colon polyp x1 -hs rectal polyp x1- hs  . TONSILLECTOMY          Home Medications    Prior to Admission medications   Medication Sig Start Date End Date Taking? Authorizing Provider  bisoprolol-hydrochlorothiazide (ZIAC) 10-6.25 MG tablet Take 1 tablet by mouth daily. 05/01/17   Terald Sleeper, PA-C  cyclobenzaprine (FLEXERIL) 10 MG tablet TAKE 1 TABLET BY MOUTH THREE TIMES DAILY AS NEEDED Patient taking differently: TAKE 1 TABLET BY MOUTH THREE TIMES DAILY AS NEEDED FOR BACK SPASMS. 01/03/17   Terald Sleeper, PA-C  lisinopril-hydrochlorothiazide (PRINZIDE,ZESTORETIC) 20-25 MG tablet Take 1 tablet by mouth daily. 07/31/17   Terald Sleeper, PA-C  oxyCODONE-acetaminophen (PERCOCET) 10-325 MG tablet Take 1 tablet by mouth 2 (two) times daily. 11/01/17   Terald Sleeper, PA-C  oxyCODONE-acetaminophen (PERCOCET) 10-325 MG tablet Take 1 tablet by mouth every 8 (eight) hours as needed for pain. 11/01/17   Terald Sleeper, PA-C  oxyCODONE-acetaminophen (PERCOCET) 10-325 MG tablet Take 1  tablet by mouth 2 (two) times daily. 11/01/17   Terald Sleeper, PA-C  terbinafine (LAMISIL) 1 % cream Apply 1 application topically 2 (two) times daily as needed (FOR ITCHY SKIN.). 08/27/17   Terald Sleeper, PA-C    Family History Family History  Problem Relation Age of Onset  . Colon cancer Mother        died at age 22  . Heart attack Father   . Hepatitis Sister 42       Autoimmune  . Heart attack Paternal Grandfather     Social History Social History   Tobacco Use  . Smoking status: Never Smoker  . Smokeless tobacco: Never Used  Substance Use Topics  . Alcohol use: No  . Drug  use: Yes    Types: Marijuana    Comment: occassional     Allergies   Ibuprofen; Diflucan [fluconazole]; and Losartan   Review of Systems Review of Systems  Gastrointestinal: Positive for abdominal pain.  ROS: Statement: All systems negative except as marked or noted in the HPI; Constitutional: Negative for fever and chills. ; ; Eyes: Negative for eye pain, redness and discharge. ; ; ENMT: Negative for ear pain, hoarseness, nasal congestion, sinus pressure and sore throat. ; ; Cardiovascular: +CP, SOB, diaphoresis. Negative for palpitations, and peripheral edema. ; ; Respiratory: Negative for cough, wheezing and stridor. ; ; Gastrointestinal: +diarrhea, abd pain. Negative for nausea, vomiting, blood in stool, hematemesis, jaundice and rectal bleeding. . ; ; Genitourinary: Negative for dysuria, flank pain and hematuria. ; ; Musculoskeletal: Negative for back pain and neck pain. Negative for swelling and trauma.; ; Skin: Negative for pruritus, rash, abrasions, blisters, bruising and skin lesion.; ; Neuro: +lightheadedness. Negative for headache and neck stiffness. Negative for weakness, altered level of consciousness, altered mental status, extremity weakness, paresthesias, involuntary movement, seizure and syncope.      Physical Exam Updated Vital Signs BP 115/84 (BP Location: Right Arm)   Pulse 86   Temp 97.7 F (36.5 C) (Oral)   Resp (!) 26   Ht 5\' 11"  (1.803 m)   Wt (!) 180.5 kg   SpO2 96%   BMI 55.51 kg/m   Physical Exam 1745: Physical examination:  Nursing notes reviewed; Vital signs and O2 SAT reviewed;  Constitutional: Well developed, Well nourished, Well hydrated, In no acute distress; Head:  Normocephalic, atraumatic; Eyes: EOMI, PERRL, No scleral icterus; ENMT: Mouth and pharynx normal, Mucous membranes moist; Neck: Supple, Full range of motion, No lymphadenopathy; Cardiovascular: Regular rate and rhythm, No gallop; Respiratory: Breath sounds clear & equal bilaterally, No  wheezes.  Speaking full sentences with ease, Normal respiratory effort/excursion; Chest: Nontender, Movement normal; Abdomen: Obese. Soft, +LLQ tenderness to palp. Nondistended, Normal bowel sounds; Genitourinary: No CVA tenderness; Extremities: Peripheral pulses normal, No tenderness, No edema, No calf edema or asymmetry.; Neuro: AA&Ox3, Major CN grossly intact.  Speech clear. No gross focal motor or sensory deficits in extremities.; Skin: Color normal, Warm, Dry.   ED Treatments / Results  Labs (all labs ordered are listed, but only abnormal results are displayed)   EKG EKG Interpretation  Date/Time:  Friday January 19 2018 17:38:28 EDT Ventricular Rate:  85 PR Interval:    QRS Duration: 123 QT Interval:  416 QTC Calculation: 495 R Axis:   69 Text Interpretation:  Sinus rhythm Atrial premature complex Nonspecific intraventricular conduction delay Abnormal T, consider ischemia, diffuse leads Baseline wander When compared with ECG of 03/09/2010 Diffuse Nonspecific ST and T wave abnormality is now  Present Confirmed by Francine Graven [id number]) on 01/19/2018 5:46:31 PM   Radiology   Procedures Procedures (including critical care time)  Medications Ordered in ED Medications - No data to display   Initial Impression / Assessment and Plan / ED Course  I have reviewed the triage vital signs and the nursing notes.  Pertinent labs & imaging results that were available during my care of the patient were reviewed by me and considered in my medical decision making (see chart for details).  MDM Reviewed: previous chart, nursing note and vitals Reviewed previous: labs and ECG Interpretation: labs, ECG, x-ray and CT scan Total time providing critical care: 30-74 minutes. This excludes time spent performing separately reportable procedures and services. Consults: cardiology and admitting MD    CRITICAL CARE Performed by: Francine Graven Total critical care time: 35 minutes Critical  care time was exclusive of separately billable procedures and treating other patients. Critical care was necessary to treat or prevent imminent or life-threatening deterioration. Critical care was time spent personally by me on the following activities: development of treatment plan with patient and/or surrogate as well as nursing, discussions with consultants, evaluation of patient's response to treatment, examination of patient, obtaining history from patient or surrogate, ordering and performing treatments and interventions, ordering and review of laboratory studies, ordering and review of radiographic studies, pulse oximetry and re-evaluation of patient's condition.   Results for orders placed or performed during the hospital encounter of 01/19/18  Comprehensive metabolic panel  Result Value Ref Range   Sodium 136 135 - 145 mmol/L   Potassium 3.3 (L) 3.5 - 5.1 mmol/L   Chloride 100 98 - 111 mmol/L   CO2 26 22 - 32 mmol/L   Glucose, Bld 156 (H) 70 - 99 mg/dL   BUN 22 (H) 6 - 20 mg/dL   Creatinine, Ser 1.62 (H) 0.61 - 1.24 mg/dL   Calcium 8.8 (L) 8.9 - 10.3 mg/dL   Total Protein 7.5 6.5 - 8.1 g/dL   Albumin 3.7 3.5 - 5.0 g/dL   AST 30 15 - 41 U/L   ALT 29 0 - 44 U/L   Alkaline Phosphatase 55 38 - 126 U/L   Total Bilirubin 0.8 0.3 - 1.2 mg/dL   GFR calc non Af Amer 46 (L) >60 mL/min   GFR calc Af Amer 53 (L) >60 mL/min   Anion gap 10 5 - 15  Lipase, blood  Result Value Ref Range   Lipase 31 11 - 51 U/L  Troponin I  Result Value Ref Range   Troponin I 0.32 (HH) <0.03 ng/mL  Lactic acid, plasma  Result Value Ref Range   Lactic Acid, Venous 3.9 (HH) 0.5 - 1.9 mmol/L  CBC with Differential  Result Value Ref Range   WBC 11.6 (H) 4.0 - 10.5 K/uL   RBC 4.25 4.22 - 5.81 MIL/uL   Hemoglobin 13.7 13.0 - 17.0 g/dL   HCT 41.1 39.0 - 52.0 %   MCV 96.7 80.0 - 100.0 fL   MCH 32.2 26.0 - 34.0 pg   MCHC 33.3 30.0 - 36.0 g/dL   RDW 12.6 11.5 - 15.5 %   Platelets 257 150 - 400 K/uL   nRBC  0.0 0.0 - 0.2 %   Neutrophils Relative % 80 %   Neutro Abs 9.4 (H) 1.7 - 7.7 K/uL   Lymphocytes Relative 9 %   Lymphs Abs 1.0 0.7 - 4.0 K/uL   Monocytes Relative 8 %   Monocytes Absolute 0.9 0.1 - 1.0 K/uL  Eosinophils Relative 1 %   Eosinophils Absolute 0.1 0.0 - 0.5 K/uL   Basophils Relative 1 %   Basophils Absolute 0.1 0.0 - 0.1 K/uL   Immature Granulocytes 1 %   Abs Immature Granulocytes 0.07 0.00 - 0.07 K/uL  Urinalysis, Routine w reflex microscopic  Result Value Ref Range   Color, Urine YELLOW YELLOW   APPearance CLEAR CLEAR   Specific Gravity, Urine 1.036 (H) 1.005 - 1.030   pH 5.0 5.0 - 8.0   Glucose, UA NEGATIVE NEGATIVE mg/dL   Hgb urine dipstick NEGATIVE NEGATIVE   Bilirubin Urine NEGATIVE NEGATIVE   Ketones, ur NEGATIVE NEGATIVE mg/dL   Protein, ur NEGATIVE NEGATIVE mg/dL   Nitrite NEGATIVE NEGATIVE   Leukocytes, UA NEGATIVE NEGATIVE  Troponin I  Result Value Ref Range   Troponin I 0.33 (HH) <0.03 ng/mL  Magnesium  Result Value Ref Range   Magnesium 2.0 1.7 - 2.4 mg/dL   Dg Chest 2 View Result Date: 01/19/2018 CLINICAL DATA:  Shortness of Breath.  Hypertension. EXAM: CHEST - 2 VIEW COMPARISON:  Chest radiograph March 09, 2010; chest CT March 09, 2010 FINDINGS: There is no edema or consolidation. The heart size and pulmonary vascularity are normal. No adenopathy. No bone lesions. IMPRESSION: No edema or consolidation. Electronically Signed   By: Lowella Grip III M.D.   On: 01/19/2018 18:54    Results for DONNIVAN, VILLENA (MRN [id number]) as of 01/19/2018 19:09  Ref. Range 11/15/2016 12:22 12/13/2016 12:18 05/22/2017 12:53 07/31/2017 13:13 01/19/2018 17:53  BUN Latest Ref Range: 6 - 20 mg/dL 16 27 (H) 24 (H) 28 (H) 22 (H)  Creatinine Latest Ref Range: 0.61 - 1.24 mg/dL 1.26 1.82 (H) 1.37 (H) 1.21 1.62 (H)    1925:  EKG with new diffuse STTW changes, no acute ST elevations. Troponin elevated. BUN/Cr also elevated; IVF bolus given.  T/C returned from [facility]  Cards Dr. Domenic Polite, case discussed, including:  HPI, pertinent PM/SHx, VS/PE, dx testing, ED course and treatment: pt will need cardiac cath likely Monday unless pt develops symptoms, troponin starts to elevate, etc; OK to stay at [facility] for now and start IV hydration to trend down BUN/Cr and start IV heparin gtt.   2020:  The patient is noted to have an elevated lactate. With the current information available to me, I don't think the patient is in septic shock, given normal CT A/P, UDip, CXR and WBC count. Pt remains afebrile. Will continue IVF.   2030:  Pt apparently previously did not want a cardiac cath; he is now agreeable "if you all think I need it." Pt ambulated to the bathroom; c/o DOE, denies CP.  2nd troponin without significant elevation. Potassium repleted PO.  Dx and testing d/w pt and family.  Questions answered.  Verb understanding, agreeable to admit. T/C returned from [facility] Dr. Nehemiah Settle, case discussed, including:  HPI, pertinent PM/SHx, VS/PE, dx testing, ED course and treatment:  Agreeable to admit.    Final Clinical Impressions(s) / ED Diagnoses   Final diagnoses:  None    ED Discharge Orders    None       Francine Graven, DO 01/22/18 1540

## 2018-01-19 NOTE — ED Notes (Signed)
CRITICAL VALUE ALERT  Critical Value:  Troponin - 0.32  Date & Time Notied:  01/19/18  1849  Provider Notified: Dr Thurnell Garbe  Orders Received/Actions taken:

## 2018-01-19 NOTE — ED Notes (Signed)
Hospital Provider at Bedside.

## 2018-01-20 DIAGNOSIS — N183 Chronic kidney disease, stage 3 unspecified: Secondary | ICD-10-CM

## 2018-01-20 DIAGNOSIS — Z6841 Body Mass Index (BMI) 40.0 and over, adult: Secondary | ICD-10-CM

## 2018-01-20 LAB — TROPONIN I
Troponin I: 0.23 ng/mL (ref <0.03)
Troponin I: 0.19 ng/mL (ref <0.03)

## 2018-01-20 LAB — CBC
HEMATOCRIT: 40.3 % (ref 39.0–52.0)
HEMOGLOBIN: 12.9 g/dL — AB (ref 13.0–17.0)
MCH: 31.6 pg (ref 26.0–34.0)
MCHC: 32 g/dL (ref 30.0–36.0)
MCV: 98.8 fL (ref 80.0–100.0)
Platelets: 231 10*3/uL (ref 150–400)
RBC: 4.08 MIL/uL — ABNORMAL LOW (ref 4.22–5.81)
RDW: 12.7 % (ref 11.5–15.5)
WBC: 10.3 10*3/uL (ref 4.0–10.5)
nRBC: 0 % (ref 0.0–0.2)

## 2018-01-20 LAB — HEPARIN LEVEL (UNFRACTIONATED)
HEPARIN UNFRACTIONATED: 0.24 [IU]/mL — AB (ref 0.30–0.70)
Heparin Unfractionated: 0.15 IU/mL — ABNORMAL LOW (ref 0.30–0.70)

## 2018-01-20 LAB — LIPID PANEL
CHOLESTEROL: 154 mg/dL (ref 0–200)
HDL: 30 mg/dL — AB (ref >40)
LDL CALC: 98 mg/dL (ref 0–99)
Total CHOL/HDL Ratio: 5.1 RATIO
Triglycerides: 130 mg/dL (ref <150)
VLDL: 26 mg/dL (ref 0–40)

## 2018-01-20 LAB — BASIC METABOLIC PANEL
Anion gap: 6 (ref 5–15)
BUN: 22 mg/dL — ABNORMAL HIGH (ref 6–20)
CHLORIDE: 102 mmol/L (ref 98–111)
CO2: 30 mmol/L (ref 22–32)
Calcium: 8.6 mg/dL — ABNORMAL LOW (ref 8.9–10.3)
Creatinine, Ser: 1.58 mg/dL — ABNORMAL HIGH (ref 0.61–1.24)
GFR calc non Af Amer: 47 mL/min — ABNORMAL LOW (ref >60)
GFR, EST AFRICAN AMERICAN: 55 mL/min — AB (ref >60)
Glucose, Bld: 165 mg/dL — ABNORMAL HIGH (ref 70–99)
POTASSIUM: 3.7 mmol/L (ref 3.5–5.1)
SODIUM: 138 mmol/L (ref 135–145)

## 2018-01-20 LAB — HEMOGLOBIN A1C
HEMOGLOBIN A1C: 6.2 % — AB (ref 4.8–5.6)
Mean Plasma Glucose: 131.24 mg/dL

## 2018-01-20 LAB — CBG MONITORING, ED
GLUCOSE-CAPILLARY: 144 mg/dL — AB (ref 70–99)
Glucose-Capillary: 218 mg/dL — ABNORMAL HIGH (ref 70–99)
Glucose-Capillary: 144 mg/dL — ABNORMAL HIGH (ref 70–99)

## 2018-01-20 LAB — GLUCOSE, CAPILLARY: Glucose-Capillary: 156 mg/dL — ABNORMAL HIGH (ref 70–99)

## 2018-01-20 LAB — MRSA PCR SCREENING: MRSA by PCR: NEGATIVE

## 2018-01-20 MED ORDER — HEPARIN BOLUS VIA INFUSION
2000.0000 [IU] | Freq: Once | INTRAVENOUS | Status: AC
  Administered 2018-01-20: 2000 [IU] via INTRAVENOUS

## 2018-01-20 MED ORDER — ACETAMINOPHEN 325 MG PO TABS: 650.0000 mg | ORAL_TABLET | ORAL | Status: DC | PRN

## 2018-01-20 MED ORDER — OXYCODONE-ACETAMINOPHEN 10-325 MG PO TABS: 1.0000 | ORAL_TABLET | Freq: Two times a day (BID) | ORAL | Status: DC

## 2018-01-20 MED ORDER — HEPARIN BOLUS VIA INFUSION
3000.0000 [IU] | Freq: Once | INTRAVENOUS | Status: AC
  Administered 2018-01-20: 3000 [IU] via INTRAVENOUS

## 2018-01-20 MED ORDER — ASPIRIN EC 81 MG PO TBEC
81.0000 mg | DELAYED_RELEASE_TABLET | Freq: Every day | ORAL | Status: DC
  Administered 2018-01-20 – 2018-01-23 (×4): 81 mg via ORAL
  Filled 2018-01-20 (×4): qty 1

## 2018-01-20 MED ORDER — POTASSIUM CHLORIDE CRYS ER 20 MEQ PO TBCR
40.0000 meq | EXTENDED_RELEASE_TABLET | Freq: Two times a day (BID) | ORAL | Status: DC
  Administered 2018-01-20: 40 meq via ORAL
  Filled 2018-01-20: qty 2

## 2018-01-20 MED ORDER — ONDANSETRON HCL 4 MG/2ML IJ SOLN: 4.0000 mg | Freq: Four times a day (QID) | INTRAMUSCULAR | Status: DC | PRN

## 2018-01-20 MED ORDER — CYCLOBENZAPRINE HCL 10 MG PO TABS: 10.0000 mg | ORAL_TABLET | Freq: Three times a day (TID) | ORAL | Status: DC | PRN

## 2018-01-20 MED ORDER — NITROGLYCERIN 0.4 MG SL SUBL: 0.4000 mg | SUBLINGUAL_TABLET | SUBLINGUAL | Status: DC | PRN

## 2018-01-20 MED ORDER — INSULIN ASPART 100 UNIT/ML ~~LOC~~ SOLN
0.0000 [IU] | Freq: Every day | SUBCUTANEOUS | Status: DC
  Administered 2018-01-20: 2 [IU] via SUBCUTANEOUS

## 2018-01-20 MED ORDER — POTASSIUM CHLORIDE CRYS ER 20 MEQ PO TBCR
40.0000 meq | EXTENDED_RELEASE_TABLET | Freq: Every day | ORAL | Status: DC
  Administered 2018-01-21 – 2018-01-23 (×3): 40 meq via ORAL
  Filled 2018-01-20 (×3): qty 2

## 2018-01-20 MED ORDER — INSULIN ASPART 100 UNIT/ML ~~LOC~~ SOLN
0.0000 [IU] | Freq: Three times a day (TID) | SUBCUTANEOUS | Status: DC
  Administered 2018-01-20 – 2018-01-21 (×4): 2 [IU] via SUBCUTANEOUS
  Administered 2018-01-21: 5 [IU] via SUBCUTANEOUS
  Administered 2018-01-22: 3 [IU] via SUBCUTANEOUS
  Administered 2018-01-22 – 2018-01-23 (×2): 2 [IU] via SUBCUTANEOUS

## 2018-01-20 MED ORDER — ATORVASTATIN CALCIUM 80 MG PO TABS
80.0000 mg | ORAL_TABLET | Freq: Every day | ORAL | Status: DC
  Administered 2018-01-20 – 2018-01-23 (×4): 80 mg via ORAL
  Filled 2018-01-20 (×4): qty 1

## 2018-01-20 MED ORDER — BISOPROLOL FUMARATE 5 MG PO TABS
5.0000 mg | ORAL_TABLET | Freq: Every day | ORAL | Status: DC
  Administered 2018-01-21 – 2018-01-23 (×3): 5 mg via ORAL
  Filled 2018-01-20 (×6): qty 1

## 2018-01-20 NOTE — ED Notes (Signed)
Hospitalist at bedside 

## 2018-01-20 NOTE — Progress Notes (Signed)
[place] for Heparin Indication: CP/ACS  Allergies  Allergen Reactions  . Ibuprofen Other (See Comments)    Rectal bleed  . Diflucan [Fluconazole] Rash  . Losartan Rash    Patient Measurements: Height: 5\' 11"  (180.3 cm) Weight: (!) 398 lb (180.5 kg) IBW/kg (Calculated) : 75.3 Heparin Dosing Weight: 120 kg  Vital Signs: BP: 142/94 (10/26 1000) Pulse Rate: 78 (10/26 1000)  Labs: Recent Labs    01/19/18 1753  01/19/18 2146 01/20/18 0208 01/20/18 0556 01/20/18 1006  HGB 13.7  --   --  12.9*  --   --   HCT 41.1  --   --  40.3  --   --   PLT 257  --   --  231  --   --   HEPARINUNFRC  --   --   --  0.15*  --  0.24*  CREATININE 1.62*  --   --  1.58*  --   --   TROPONINI 0.32*   < > 0.31* 0.23* 0.19*  --    < > = values in this interval not displayed.    Estimated Creatinine Clearance: 86.7 mL/min (A) (by C-G formula based on SCr of 1.58 mg/dL (H)).   Medical History: Past Medical History:  Diagnosis Date  . Chronic back pain   . Essential hypertension   . Morbid obesity ([place])   . Panic disorder   . Prediabetes   . Shoulder pain, left    Following fall   Assessment: Pharmacy consulted to dose heparin for patient with CP/ACS. Hgb 12.9. Troponin mildly elevated but trending down (0.31>>0.19).  heparin level is sub-therapeutic. No issues per RN.  Goal of Therapy:  Heparin level 0.3-0.7 units/ml Monitor platelets by anticoagulation protocol: Yes   Plan:  Heparin 2000 units bolus Increase heparin drip to 2000 units/hr Re-check heparin level in 6-8 hours Daily CBC/HL Monitor for bleeding  Revonda Standard Nevah Dalal 01/20/2018,10:54 AM

## 2018-01-20 NOTE — ED Notes (Signed)
Patient wanted to know when he was getting breakfast. Pt stated that what staff brought him last night was "uneatable". Pt asking if food today will be "uneatable" as well. Educated patient on heart healthy food choices and that if family brings pt food it needs to be heart healthy. Sarah informed pt that we cannot change pt's diet plan.

## 2018-01-20 NOTE — ED Notes (Signed)
Called [facility] for transport to [facility]. 

## 2018-01-20 NOTE — ED Notes (Signed)
Pt called out stating "his breakfast tray was not edible." Pt states "the bread is too dry, the oatmeal is nasty and not even a dog would eat this." RN called for something else from the cafeteria to be sent.

## 2018-01-20 NOTE — Progress Notes (Addendum)
PROGRESS NOTE  Bradley Torres:[id number] DOB: 1960-04-10 DOA: 01/19/2018 PCP: Terald Sleeper, PA-C  Brief History:  57 year old male with a history of impaired glucose tolerance, hypertension, diverticulosis, morbid obesity, chronic back pain presenting with left-sided chest discomfort and abdominal pain.  On 12/26/2017, the patient was prescribed Cipro and Flagyl for presumptive diverticulitis.  The patient had symptoms of left lower quadrant abdominal pain with loose stools.  He stated that his abdominal pain and loose stools improved on the metronidazole and ciprofloxacin.  He endorses compliance with his medications.  He stated that approximately 2 days prior to this admission he began developing 1-2 loose stools again without any hematochezia or melena.  In addition, the patient had cramping lower abdominal pain.  He went to see his primary care provider on the day of admission.  Because of relative hypotension and abdominal pain, there was concern for worsening infection.  As result, the patient was sent to emergency department for further evaluation.  CT of the abdomen and pelvis in the emergency department showed fatty liver, cholelithiasis without cholecystitis, and diverticulosis without any inflammation.  There were no other acute changes.  Patient has not had any bowel movements in over 24 hours.  His last bowel movement was in the early morning 01/19/2018. The patient states that he has had intermittent chest discomfort with exertion for the better part of 2 years.  However in the past 2 to 3 days, he has had worsening shortness of breath and dyspnea on exertion in addition to his exertional chest discomfort.  He denies any nausea, vomiting, dysuria, hematuria, cough, hemoptysis.  In the emergency department, patient was afebrile hemodynamically stable saturating 96% on room air.  Initial BMP and LFTs were unremarkable with lipase 31.  WBC was 11.6.  CT of the abdomen and pelvis did  not show any acute findings.  Chest x-ray was negative.  EKG shows sinus rhythm with T wave inversions diffusely.  Patient had elevated troponin.  As result, the patient was started on IV heparin.  Cardiology was consulted.  The patient will be transferred to [facility] for possible heart catheterization at the beginning of the week.  Assessment/Plan: Elevated troponin/NSTEMI -Troponin trend relatively flat--0.32--0.33--0.31 -Continue IV heparin -LDL 98 -Start Lipitor 80 mg daily -Cardiology consultation -pt refuses nitroglycerin in any form--I discussed risks/benefits/alternatives and pt expressed understanding--"It makes me feel like I want to die" -personally reviewed EKG--sinus, diffuse TWI -personally reviewed CXR--no edema or consolidation   Abdominal pain/lactic acidosis -Continue IV fluids -Lactic acid peaked to 3.9>>> 1.4 -01/19/2018 CT abdomen and pelvis--no acute findings; diverticulosis in the left colon without inflammatory changes.  Cholelithiasis without cholecystitis, hepatic steatosis -Urinalysis negative for pyuria -As the patient is afebrile and hemodynamically stable, hold off on antibiotics; suspect patient is volume depleted -Blood cultures x2 sets -Presently abdominal exam is benign  CKD stage III -Baseline creatinine 1.2-1.5 -Continue IV fluids  Essential hypertension -Continue bisoprolol -Holding lisinopril and HCTZ in preparation for heart catheterization  Hypokalemia -Repleted -Magnesium 2.0  Impaired glucose tolerance -Check hemoglobin A1c -11/15/2017 hemoglobin A1C--5.6  Chronic back pain -Continue home dose of Percocet -The [place] Controlled Substance Reporting System has been queried for this patient--no red flags  Morbid Obesity -BMI 55.51 -lifestyle modification     Disposition Plan:   Transfer to [facility] Communication:   Family at bedside  Consultants:  Cardiology  Code Status:  FULL   DVT Prophylaxis:  [place]  Heparin / [place] Lovenox   Procedures: As Listed in Progress Note Above  Antibiotics: None    Subjective: Patient continues to have intermittent chest discomfort in emergency department but denies any worsening shortness breath, dizziness, back pain.  He denies any nausea, vomiting, diarrhea, hematochezia, melena, dysuria, hematuria.  There is no fevers or chills.  No headache or dizziness presently.  Objective: Vitals:   01/20/18 0500 01/20/18 0530 01/20/18 0606 01/20/18 0606  BP:   121/90 121/90  Pulse: 76 75 74 75  Resp: 20 20 19 19   Temp:      TempSrc:      SpO2: 92% 92% 96% 95%  Weight:      Height:        Intake/Output Summary (Last 24 hours) at 01/20/2018 0754 Last data filed at 01/20/2018 0128 Gross per 24 hour  Intake 3000 ml  Output -  Net 3000 ml   Weight change:  Exam:   General:  Pt is alert, follows commands appropriately, not in acute distress  HEENT: No icterus, No thrush, No neck mass, /AT  Cardiovascular: RRR, S1/S2, no rubs, no gallops  Respiratory: CTA bilaterally, no wheezing, no crackles, no rhonchi  Abdomen: Soft/+BS, non tender, non distended, no guarding  Extremities: No edema, No lymphangitis, No petechiae, No rashes, no synovitis   Data Reviewed: I have personally reviewed following labs and imaging studies Basic Metabolic Panel: Recent Labs  Lab 01/19/18 1753 01/19/18 1918 01/20/18 0208  NA 136  --  138  K 3.3*  --  3.7  CL 100  --  102  CO2 26  --  30  GLUCOSE 156*  --  165*  BUN 22*  --  22*  CREATININE 1.62*  --  1.58*  CALCIUM 8.8*  --  8.6*  MG  --  2.0  --    Liver Function Tests: Recent Labs  Lab 01/19/18 1753  AST 30  ALT 29  ALKPHOS 55  BILITOT 0.8  PROT 7.5  ALBUMIN 3.7   Recent Labs  Lab 01/19/18 1753  LIPASE 31   No results for input(s): AMMONIA in the last 168 hours. Coagulation Profile: No results for input(s): INR, PROTIME in the last 168 hours. CBC: Recent Labs  Lab 01/19/18 1753  01/20/18 0208  WBC 11.6* 10.3  NEUTROABS 9.4*  --   HGB 13.7 12.9*  HCT 41.1 40.3  MCV 96.7 98.8  PLT 257 231   Cardiac Enzymes: Recent Labs  Lab 01/19/18 1753 01/19/18 1918 01/19/18 2146 01/20/18 0208 01/20/18 0556  TROPONINI 0.32* 0.33* 0.31* 0.23* 0.19*   BNP: Invalid input(s): POCBNP CBG: Recent Labs  Lab 01/20/18 0115  GLUCAP 218*   HbA1C: No results for input(s): HGBA1C in the last 72 hours. Urine analysis:    Component Value Date/Time   COLORURINE YELLOW 01/19/2018 1946   APPEARANCEUR CLEAR 01/19/2018 1946   LABSPEC 1.036 (H) 01/19/2018 1946   PHURINE 5.0 01/19/2018 1946   GLUCOSEU NEGATIVE 01/19/2018 1946   HGBUR NEGATIVE 01/19/2018 1946   [place] NEGATIVE 01/19/2018 1946   KETONESUR NEGATIVE 01/19/2018 1946   PROTEINUR NEGATIVE 01/19/2018 1946   NITRITE NEGATIVE 01/19/2018 1946   LEUKOCYTESUR NEGATIVE 01/19/2018 1946   Sepsis Labs: @LABRCNTIP (procalcitonin:4,lacticidven:4) )No results found for this or any previous visit (from the past 240 hour(s)).   Scheduled Meds: . aspirin EC  81 mg Oral Daily  . atorvastatin  80 mg Oral q1800  . insulin aspart  0-15 Units Subcutaneous TID WC  . insulin  aspart  0-5 Units Subcutaneous QHS  . potassium chloride  40 mEq Oral BID   Continuous Infusions: . sodium chloride 100 mL/hr at 01/20/18 0128  . heparin 1,800 Units/hr (01/20/18 0726)    Procedures/Studies: Dg Chest 2 View  Result Date: 01/19/2018 CLINICAL DATA:  Shortness of Breath.  Hypertension. EXAM: CHEST - 2 VIEW COMPARISON:  Chest radiograph March 09, 2010; chest CT March 09, 2010 FINDINGS: There is no edema or consolidation. The heart size and pulmonary vascularity are normal. No adenopathy. No bone lesions. IMPRESSION: No edema or consolidation. Electronically Signed   By: Lowella Grip III M.D.   On: 01/19/2018 18:54   Ct Abdomen Pelvis W Contrast  Result Date: 01/19/2018 CLINICAL DATA:  Abdominal pain, unspecified.  Symptoms  for 2 weeks. EXAM: CT ABDOMEN AND PELVIS WITH CONTRAST TECHNIQUE: Multidetector CT imaging of the abdomen and pelvis was performed using the standard protocol following bolus administration of intravenous contrast. CONTRAST:  110mL ISOVUE-300 IOPAMIDOL (ISOVUE-300) INJECTION 61% COMPARISON:  None. FINDINGS: Lower chest:  No contributory findings. Hepatobiliary: Hepatic steatosis.Layering gallstone. No evidence of biliary obstruction or inflammation. Pancreas: Unremarkable. Spleen: Unremarkable. Adrenals/Urinary Tract: Negative adrenals. 3.2 cm upper pole cyst on the right. Symmetric normal renal enhancement otherwise. No stone or hydronephrosis. Unremarkable bladder. Stomach/Bowel: No obstruction. No appendicitis. Left colonic diverticulosis. Vascular/Lymphatic: No acute vascular abnormality. Atherosclerotic calcification. No mass or adenopathy. Reproductive:Prostate calcification. Other: No ascites or pneumoperitoneum. Musculoskeletal: No acute abnormalities. L4-5 PLIF with solid arthrodesis. IMPRESSION: 1. No acute finding. 2. Hepatic steatosis, cholelithiasis, and colonic diverticulosis. Electronically Signed   By: Monte Fantasia M.D.   On: 01/19/2018 19:18    Orson Eva, DO  [facility] Pager [phone]  If 7PM-7AM, please contact night-coverage [email or web address] Password [email or web address] 01/20/2018, 7:54 AM   LOS: 1 day

## 2018-01-20 NOTE — ED Notes (Signed)
Date and time results received: 01/20/18 0705  Test: Troponin Critical Value: 0.19  Name of Provider Notified: [place], MD

## 2018-01-20 NOTE — ED Notes (Signed)
Tiffany, AC and myself transferred pt to inpatient bed. Provided comfort measures. Call light within reach.

## 2018-01-20 NOTE — ED Notes (Signed)
Pt reports left sided chest pain rating 6/10, described as dull and aching. Pt refuses SL Nitroglycerin.

## 2018-01-20 NOTE — Progress Notes (Signed)
[place] for Heparin Indication: CP/ACS  Allergies  Allergen Reactions  . Ibuprofen Other (See Comments)    Rectal bleed  . Diflucan [Fluconazole] Rash  . Losartan Rash    Patient Measurements: Height: 5\' 11"  (180.3 cm) Weight: (!) 398 lb (180.5 kg) IBW/kg (Calculated) : 75.3 Heparin Dosing Weight: 120 kg  Vital Signs: Temp: 97.7 F (36.5 C) (10/25 1732) Temp Source: Oral (10/25 1732) BP: 133/82 (10/26 0105) Pulse Rate: 89 (10/26 0105)  Labs: Recent Labs    01/19/18 1753 01/19/18 1918 01/19/18 2146 01/20/18 0208  HGB 13.7  --   --  12.9*  HCT 41.1  --   --  40.3  PLT 257  --   --  231  HEPARINUNFRC  --   --   --  0.15*  CREATININE 1.62*  --   --  1.58*  TROPONINI 0.32* 0.33* 0.31*  --     Estimated Creatinine Clearance: 86.7 mL/min (A) (by C-G formula based on SCr of 1.58 mg/dL (H)).   Medical History: Past Medical History:  Diagnosis Date  . Chronic back pain   . Essential hypertension   . Morbid obesity ([place])   . Panic disorder   . Prediabetes   . Shoulder pain, left    Following fall   Assessment: Pharmacy consulted to dose heparin for patient with CP/ACS. Hgb 12.9. Troponin mildly elevated but trending down (0.31>>0.23). Initial heparin level is sub-therapeutic. No issues per RN.  Goal of Therapy:  Heparin level 0.3-0.7 units/ml Monitor platelets by anticoagulation protocol: Yes   Plan:  Heparin 3000 units BOLUS Increase heparin drip to 1800 units/hr Re-check heparin level in 6-8 hours Daily CBC/HL Monitor for bleeding  Narda Bonds 01/20/2018,2:50 AM

## 2018-01-20 NOTE — ED Notes (Signed)
Gave pt heart healthy food tray. Pt tried food and said "It taste like a can of dog food, tastes like shit". Told me to take tray away. Pt's family brought him a club sandwich from [employer] in [place].

## 2018-01-21 LAB — GLUCOSE, CAPILLARY
GLUCOSE-CAPILLARY: 143 mg/dL — AB (ref 70–99)
GLUCOSE-CAPILLARY: 219 mg/dL — AB (ref 70–99)
GLUCOSE-CAPILLARY: 143 mg/dL — AB (ref 70–99)
Glucose-Capillary: 138 mg/dL — ABNORMAL HIGH (ref 70–99)

## 2018-01-21 LAB — CBC
HEMATOCRIT: 36.7 % — AB (ref 39.0–52.0)
Hemoglobin: 11.7 g/dL — ABNORMAL LOW (ref 13.0–17.0)
MCH: 31.3 pg (ref 26.0–34.0)
MCHC: 31.9 g/dL (ref 30.0–36.0)
MCV: 98.1 fL (ref 80.0–100.0)
NRBC: 0 % (ref 0.0–0.2)
Platelets: 198 10*3/uL (ref 150–400)
RBC: 3.74 MIL/uL — AB (ref 4.22–5.81)
RDW: 12.7 % (ref 11.5–15.5)
WBC: 7.4 10*3/uL (ref 4.0–10.5)

## 2018-01-21 LAB — BASIC METABOLIC PANEL
Anion gap: 10 (ref 5–15)
BUN: 17 mg/dL (ref 6–20)
CHLORIDE: 105 mmol/L (ref 98–111)
CO2: 24 mmol/L (ref 22–32)
Calcium: 8.6 mg/dL — ABNORMAL LOW (ref 8.9–10.3)
Creatinine, Ser: 1.41 mg/dL — ABNORMAL HIGH (ref 0.61–1.24)
GFR calc Af Amer: >60 mL/min (ref >60)
GFR calc non Af Amer: 54 mL/min — ABNORMAL LOW (ref >60)
GLUCOSE: 141 mg/dL — AB (ref 70–99)
POTASSIUM: 3.3 mmol/L — AB (ref 3.5–5.1)
Sodium: 139 mmol/L (ref 135–145)

## 2018-01-21 LAB — HIV ANTIBODY (ROUTINE TESTING W REFLEX): HIV SCREEN 4TH GENERATION: NONREACTIVE

## 2018-01-21 LAB — HEPARIN LEVEL (UNFRACTIONATED)
Heparin Unfractionated: 0.22 IU/mL — ABNORMAL LOW (ref 0.30–0.70)
Heparin Unfractionated: 0.44 IU/mL (ref 0.30–0.70)

## 2018-01-21 MED ORDER — POTASSIUM CHLORIDE CRYS ER 20 MEQ PO TBCR
40.0000 meq | EXTENDED_RELEASE_TABLET | Freq: Once | ORAL | Status: AC
  Administered 2018-01-21: 40 meq via ORAL
  Filled 2018-01-21: qty 2

## 2018-01-21 NOTE — Progress Notes (Signed)
[place] for Heparin Indication: CP/ACS  Allergies  Allergen Reactions  . Ibuprofen Other (See Comments)    Rectal bleed  . Diflucan [Fluconazole] Rash  . Losartan Rash    Patient Measurements: Height: 5\' 11"  (180.3 cm) Weight: (!) 401 lb (181.9 kg) IBW/kg (Calculated) : 75.3 Heparin Dosing Weight: 120 kg  Vital Signs: Temp: 97.4 F (36.3 C) (10/27 1903) Temp Source: Oral (10/27 1903) BP: 141/96 (10/27 1903) Pulse Rate: 85 (10/27 1903)  Labs: Recent Labs    01/19/18 1753  01/19/18 2146  01/20/18 0208 01/20/18 0556 01/20/18 1006 01/21/18 0723 01/21/18 2158  HGB 13.7  --   --   --  12.9*  --   --  11.7*  --   HCT 41.1  --   --   --  40.3  --   --  36.7*  --   PLT 257  --   --   --  231  --   --  198  --   HEPARINUNFRC  --   --   --    < > 0.15*  --  0.24* 0.22* 0.44  CREATININE 1.62*  --   --   --  1.58*  --   --  1.41*  --   TROPONINI 0.32*   < > 0.31*  --  0.23* 0.19*  --   --   --    < > = values in this interval not displayed.    Estimated Creatinine Clearance: 97.6 mL/min (A) (by C-G formula based on SCr of 1.41 mg/dL (H)).   Medical History: Past Medical History:  Diagnosis Date  . Chronic back pain   . Essential hypertension   . Morbid obesity ([place])   . Panic disorder   . Prediabetes   . Shoulder pain, left    Following fall   Assessment: Pharmacy consulted to dose heparin for patient with CP/ACS. Hgb 12.9. Troponin mildly elevated but trending down. Plan for cath  10/27 PM update: heparin level therapeutic x 1 after rate incrase  Goal of Therapy:  Heparin level 0.3-0.7 units/ml Monitor platelets by anticoagulation protocol: Yes   Plan:  Continue heparin at 2400 units/hr Confirmatory heparin level with AM labs Monitor for bleeding  Narda Bonds 01/21/2018,10:56 PM

## 2018-01-21 NOTE — Consult Note (Signed)
Cardiology Consultation:   Patient ID: Bradley Torres MRN: [id number]; DOB: September 09, 1960  Admit date: 01/19/2018 Date of Consult: 01/21/2018  Primary Care Provider: Terald Sleeper, PA-C Primary Cardiologist:   Prudy Feeler  Primary Electrophysiologist:     Patient Profile:   Bradley Torres is a 57 y.o. male with a hx of hypertension and morbid obesity who is being seen today for the evaluation of chest discomfort and worsening shortness of breath and elevated troponin level at the request of  Dr. Waldron Labs. Marland Kitchen  History of Present Illness:   Bradley Torres has been seen by Dr. Prudy Feeler as recently as January of this year.  Been having progressive shortness of breath with exertion.   Echocardiogram at that time had revealed normal left ventricular systolic function.  He has mild to moderate left ventricular hypertrophy.  The diastolic function was grossly normal.  The patient had a Lexiscan Myoview study which was low risk.  There is no evidence of ischemia.  His left ventricular systolic function was low normal at 53%.  He was admitted to the hospital on October 25 with a complaint of abdominal pain.  He has a long history of abdominal pain in the past.  Troponin levels were checked and were found to be elevated.  His lactic acid level was elevated at 3.9.  His white blood cell count was elevated at 11.6.  Since October 25 his troponin level has generally trended downward. His EKG reveals normal sinus rhythm at 86 beats minute.  He has rare premature ventricular contractions.  There is T wave inversions in leads V1 through V6 as well as leads II, 3, aVF. T wave inversions are new compared to his prior EKGs.     Past Medical History:  Diagnosis Date  . Chronic back pain   . Essential hypertension   . Morbid obesity ([place])   . Panic disorder   . Prediabetes   . Shoulder pain, left    Following fall    Past Surgical History:  Procedure Laterality Date  . Ankle fracture sugery Left     . COLONOSCOPY WITH PROPOFOL N/A 05/29/2017   Procedure: COLONOSCOPY WITH PROPOFOL;  Surgeon: Daneil Dolin, MD;  Location: [facility];  Service: Endoscopy;  Laterality: N/A;  2:00pm  . LUMBAR SPINE SURGERY     fusion  . POLYPECTOMY  05/29/2017   Procedure: POLYPECTOMY;  Surgeon: Daneil Dolin, MD;  Location: [facility];  Service: Endoscopy;;  ascending colon polyp x5-cs transverse colon polyp x4- cs descending colon polyp x1- hs sigmoid colon polyp x1 -hs rectal polyp x1- hs  . TONSILLECTOMY       Home Medications:  Prior to Admission medications   Medication Sig Start Date End Date Taking? Authorizing Provider  bisoprolol-hydrochlorothiazide (ZIAC) 10-6.25 MG tablet Take 1 tablet by mouth daily. 05/01/17  Yes Terald Sleeper, PA-C  ciprofloxacin (CIPRO) 500 MG tablet Take 500 mg by mouth 2 (two) times daily.   Yes [provider]  cyclobenzaprine (FLEXERIL) 10 MG tablet TAKE 1 TABLET BY MOUTH THREE TIMES DAILY AS NEEDED Patient taking differently: TAKE 1 TABLET BY MOUTH THREE TIMES DAILY AS NEEDED FOR BACK SPASMS. 01/03/17  Yes Terald Sleeper, PA-C  lisinopril-hydrochlorothiazide (PRINZIDE,ZESTORETIC) 20-25 MG tablet Take 1 tablet by mouth daily. 07/31/17  Yes Terald Sleeper, PA-C  metroNIDAZOLE (FLAGYL) 500 MG tablet Take 500 tablets by mouth 3 (three) times daily.    Yes [provider]  oxyCODONE-acetaminophen (PERCOCET) 10-325 MG tablet  Take 1 tablet by mouth every 8 (eight) hours as needed for pain. 11/01/17  Yes Terald Sleeper, PA-C  oxyCODONE-acetaminophen (PERCOCET) 10-325 MG tablet Take 1 tablet by mouth 2 (two) times daily. 11/01/17  Yes Terald Sleeper, PA-C  oxyCODONE-acetaminophen (PERCOCET) 10-325 MG tablet Take 1 tablet by mouth 2 (two) times daily. Patient not taking: Reported on 01/19/2018 11/01/17   Terald Sleeper, PA-C  terbinafine (LAMISIL) 1 % cream Apply 1 application topically 2 (two) times daily as needed (FOR ITCHY SKIN.). Patient not taking: Reported  on 01/19/2018 08/27/17   Terald Sleeper, PA-C    Inpatient Medications: Scheduled Meds: . aspirin EC  81 mg Oral Daily  . atorvastatin  80 mg Oral q1800  . bisoprolol  5 mg Oral Daily  . insulin aspart  0-15 Units Subcutaneous TID WC  . insulin aspart  0-5 Units Subcutaneous QHS  . potassium chloride  40 mEq Oral Daily   Continuous Infusions: . sodium chloride 100 mL/hr at 01/21/18 0800  . heparin 2,000 Units/hr (01/20/18 1928)   PRN Meds: acetaminophen, cyclobenzaprine, nitroGLYCERIN, ondansetron (ZOFRAN) IV  Allergies:    Allergies  Allergen Reactions  . Ibuprofen Other (See Comments)    Rectal bleed  . Diflucan [Fluconazole] Rash  . Losartan Rash    Social History:   Social History   Socioeconomic History  . Marital status: Single    Spouse name: Not on file  . Number of children: Not on file  . Years of education: Not on file  . Highest education level: Not on file  Occupational History  . Not on file  Social Needs  . Financial resource strain: Not on file  . Food insecurity:    Worry: Not on file    Inability: Not on file  . Transportation needs:    Medical: Not on file    Non-medical: Not on file  Tobacco Use  . Smoking status: Never Smoker  . Smokeless tobacco: Never Used  Substance and Sexual Activity  . Alcohol use: No  . Drug use: Yes    Types: Marijuana    Comment: occassional  . Sexual activity: Not Currently    Birth control/protection: None  Lifestyle  . Physical activity:    Days per week: Not on file    Minutes per session: Not on file  . Stress: Not on file  Relationships  . Social connections:    Talks on phone: Not on file    Gets together: Not on file    Attends religious service: Not on file    Active member of club or organization: Not on file    Attends meetings of clubs or organizations: Not on file    Relationship status: Not on file  . Intimate partner violence:    Fear of current or ex partner: Not on file    Emotionally  abused: Not on file    Physically abused: Not on file    Forced sexual activity: Not on file  Other Topics Concern  . Not on file  Social History Narrative  . Not on file    Family History:    Family History  Problem Relation Age of Onset  . Colon cancer Mother        died at age 43  . Heart attack Father   . Hepatitis Sister 42       Autoimmune  . Heart attack Paternal Grandfather      ROS:  Please see the history of  present illness.   All other ROS reviewed and negative.     Physical Exam/Data:   Vitals:   01/21/18 0000 01/21/18 0346 01/21/18 0808 01/21/18 1220  BP: 133/89  (!) 144/87 (!) 112/94  Pulse:   85 78  Resp:   19 19  Temp: 97.8 F (36.6 C) (!) 97.4 F (36.3 C) 98.1 F (36.7 C) 98.3 F (36.8 C)  TempSrc: Oral Oral Oral Oral  SpO2:   100% 92%  Weight:      Height:        Intake/Output Summary (Last 24 hours) at 01/21/2018 1422 Last data filed at 01/21/2018 1100 Gross per 24 hour  Intake 1984.78 ml  Output 575 ml  Net 1409.78 ml   Filed Weights   01/19/18 1731 01/20/18 1743  Weight: (!) 180.5 kg (!) 181.9 kg   Body mass index is 55.93 kg/m.  General: Morbidly obese gentleman, no acute distress HEENT: normal Lymph: no adenopathy Neck: no JVD Endocrine:  No thryomegaly Vascular: No carotid bruits; FA pulses 2+ bilaterally without bruits  Cardiac:  normal S1, S2; RRR; no murmur, distant heart sounds  Lungs:  clear to auscultation bilaterally, no wheezing, rhonchi or rales  Abd: soft, nontender, no hepatomegaly  Ext: no edema Musculoskeletal:  No deformities, BUE and BLE strength normal and equal Skin: warm and dry  Neuro:  CNs 2-12 intact, no focal abnormalities noted Psych:  Normal affect   EKG:  The EKG was personally reviewed and demonstrates:   NSR , TWI in the inferior leads and V1-V6  - new as of 1 year ago  Rare PVCs   Telemetry:  Telemetry was personally reviewed and demonstrates:   NSR   Relevant CV Studies:   Laboratory  Data:  Chemistry Recent Labs  Lab 01/19/18 1753 01/20/18 0208 01/21/18 0723  NA 136 138 139  K 3.3* 3.7 3.3*  CL 100 102 105  CO2 26 30 24   GLUCOSE 156* 165* 141*  BUN 22* 22* 17  CREATININE 1.62* 1.58* 1.41*  CALCIUM 8.8* 8.6* 8.6*  GFRNONAA 46* 47* 54*  GFRAA 53* 55* >60  ANIONGAP 10 6 10     Recent Labs  Lab 01/19/18 1753  PROT 7.5  ALBUMIN 3.7  AST 30  ALT 29  ALKPHOS 55  BILITOT 0.8   Hematology Recent Labs  Lab 01/19/18 1753 01/20/18 0208 01/21/18 0723  WBC 11.6* 10.3 7.4  RBC 4.25 4.08* 3.74*  HGB 13.7 12.9* 11.7*  HCT 41.1 40.3 36.7*  MCV 96.7 98.8 98.1  MCH 32.2 31.6 31.3  MCHC 33.3 32.0 31.9  RDW 12.6 12.7 12.7  PLT 257 231 198   Cardiac Enzymes Recent Labs  Lab 01/19/18 1753 01/19/18 1918 01/19/18 2146 01/20/18 0208 01/20/18 0556  TROPONINI 0.32* 0.33* 0.31* 0.23* 0.19*   No results for input(s): TROPIPOC in the last 168 hours.  BNPNo results for input(s): BNP, PROBNP in the last 168 hours.  DDimer No results for input(s): DDIMER in the last 168 hours.  Radiology/Studies:  Dg Chest 2 View  Result Date: 01/19/2018 CLINICAL DATA:  Shortness of Breath.  Hypertension. EXAM: CHEST - 2 VIEW COMPARISON:  Chest radiograph March 09, 2010; chest CT March 09, 2010 FINDINGS: There is no edema or consolidation. The heart size and pulmonary vascularity are normal. No adenopathy. No bone lesions. IMPRESSION: No edema or consolidation. Electronically Signed   By: Lowella Grip III M.D.   On: 01/19/2018 18:54   Ct Abdomen Pelvis W Contrast  Result Date: 01/19/2018  CLINICAL DATA:  Abdominal pain, unspecified.  Symptoms for 2 weeks. EXAM: CT ABDOMEN AND PELVIS WITH CONTRAST TECHNIQUE: Multidetector CT imaging of the abdomen and pelvis was performed using the standard protocol following bolus administration of intravenous contrast. CONTRAST:  144mL ISOVUE-300 IOPAMIDOL (ISOVUE-300) INJECTION 61% COMPARISON:  None. FINDINGS: Lower chest:  No  contributory findings. Hepatobiliary: Hepatic steatosis.Layering gallstone. No evidence of biliary obstruction or inflammation. Pancreas: Unremarkable. Spleen: Unremarkable. Adrenals/Urinary Tract: Negative adrenals. 3.2 cm upper pole cyst on the right. Symmetric normal renal enhancement otherwise. No stone or hydronephrosis. Unremarkable bladder. Stomach/Bowel: No obstruction. No appendicitis. Left colonic diverticulosis. Vascular/Lymphatic: No acute vascular abnormality. Atherosclerotic calcification. No mass or adenopathy. Reproductive:Prostate calcification. Other: No ascites or pneumoperitoneum. Musculoskeletal: No acute abnormalities. L4-5 PLIF with solid arthrodesis. IMPRESSION: 1. No acute finding. 2. Hepatic steatosis, cholelithiasis, and colonic diverticulosis. Electronically Signed   By: Monte Fantasia M.D.   On: 01/19/2018 19:18    Assessment and Plan:   1. 1.  Unstable angina :   Pt describes an episode of CP, dizziness,  Worsening dyspnea.   Occurred in the setting of acute abdomen.  He had lactic acidosis and elevated white count.  It is concerning that he has EKG abnormalities and mild troponin elevations.  I am concerned that he may have coronary artery disease.  He actually has been having some chest symptoms for the past 4 to 5 years.  I think that he will need a heart catheterization at some point.  We will add him to the cath schedule but I suspect that he will not be able to be fit in on Monday.  I think that he will more likely have a cath on Tuesday.  Discussed the risks, benefits, options of heart catheterization.  He understands and agrees to proceed.      For questions or updates, please contact [place] Please consult [email or web address] for contact info under     Signed, Mertie Moores, MD  01/21/2018 2:22 PM

## 2018-01-21 NOTE — Progress Notes (Signed)
Per Dr. Waldron Labs, Patient does not meet criteria for enteric precautions.

## 2018-01-21 NOTE — Progress Notes (Addendum)
[facility] for Heparin Indication: CP/ACS  Allergies  Allergen Reactions  . Ibuprofen Other (See Comments)    Rectal bleed  . Diflucan [Fluconazole] Rash  . Losartan Rash    Patient Measurements: Height: 5\' 11"  (180.3 cm) Weight: (!) 401 lb (181.9 kg) IBW/kg (Calculated) : 75.3 Heparin Dosing Weight: 120 kg  Vital Signs: Temp: 98.1 F (36.7 C) (10/27 0808) Temp Source: Oral (10/27 0808) BP: 144/87 (10/27 0808) Pulse Rate: 85 (10/27 0808)  Labs: Recent Labs    01/19/18 1753  01/19/18 2146 01/20/18 0208 01/20/18 0556 01/20/18 1006 01/21/18 0723  HGB 13.7  --   --  12.9*  --   --   --   HCT 41.1  --   --  40.3  --   --   --   PLT 257  --   --  231  --   --   --   HEPARINUNFRC  --   --   --  0.15*  --  0.24*  --   CREATININE 1.62*  --   --  1.58*  --   --  1.41*  TROPONINI 0.32*   < > 0.31* 0.23* 0.19*  --   --    < > = values in this interval not displayed.    Estimated Creatinine Clearance: 97.6 mL/min (A) (by C-G formula based on SCr of 1.41 mg/dL (H)).   Medical History: Past Medical History:  Diagnosis Date  . Chronic back pain   . Essential hypertension   . Morbid obesity ([place])   . Panic disorder   . Prediabetes   . Shoulder pain, left    Following fall   Assessment: Pharmacy consulted to dose heparin for patient with CP/ACS. Hgb 11.7, no bleeding noted.    Heparin level is still sub-therapeutic, will adjust. Cards consult pending  Goal of Therapy:  Heparin level 0.3-0.7 units/ml Monitor platelets by anticoagulation protocol: Yes   Plan:  Increase heparin to 2400 units/hr Recheck heparin level in 6 hours  Erin Hearing PharmD., BCPS Clinical Pharmacist 01/21/2018 10:01 AM

## 2018-01-21 NOTE — Plan of Care (Signed)
Patient was educated about the importance of exercise.Patient verbalized his understanding . Patient verbalized that he will cut down on soda.

## 2018-01-21 NOTE — Progress Notes (Signed)
PROGRESS NOTE  Bradley Torres XTG:[id number] DOB: 1960/10/07 DOA: 01/19/2018 PCP: Terald Sleeper, PA-C  Brief History:  57 year old male with a history of impaired glucose tolerance, hypertension, diverticulosis, morbid obesity, chronic back pain presenting with left-sided chest discomfort and abdominal pain.  On 12/26/2017, the patient was prescribed Cipro and Flagyl for presumptive diverticulitis.  The patient had symptoms of left lower quadrant abdominal pain with loose stools.  He stated that his abdominal pain and loose stools improved on the metronidazole and ciprofloxacin.  He endorses compliance with his medications.  He stated that approximately 2 days prior to this admission he began developing 1-2 loose stools again without any hematochezia or melena.  In addition, the patient had cramping lower abdominal pain.  He went to see his primary care provider on the day of admission.  Because of relative hypotension and abdominal pain, there was concern for worsening infection.  As result, the patient was sent to emergency department for further evaluation.  CT of the abdomen and pelvis in the emergency department showed fatty liver, cholelithiasis without cholecystitis, and diverticulosis without any inflammation.  There were no other acute changes.  Patient has not had any bowel movements in over 24 hours.  His last bowel movement was in the early morning 01/19/2018. The patient states that he has had intermittent chest discomfort with exertion for the better part of 2 years.  However in the past 2 to 3 days, he has had worsening shortness of breath and dyspnea on exertion in addition to his exertional chest discomfort.  He denies any nausea, vomiting, dysuria, hematuria, cough, hemoptysis.  In the emergency department, patient was afebrile hemodynamically stable saturating 96% on room air.  Initial BMP and LFTs were unremarkable with lipase 31.  WBC was 11.6.  CT of the abdomen and pelvis did  not show any acute findings.  Chest x-ray was negative.  EKG shows sinus rhythm with T wave inversions diffusely.  Patient had elevated troponin.  As result, the patient was started on IV heparin.  Cardiology was consulted.  The patient will be transferred to [facility] for possible heart catheterization at the beginning of the week.  Assessment/Plan:  Elevated troponin/NSTEMI -Troponin trend relatively flat--0.32--0.33--0.31 -Continue IV heparin -LDL 98 -Start Lipitor 80 mg daily -personally reviewed EKG--sinus, diffuse TWI -Cardiology consulted, to see today, for now he is chest pain-free, he with heparin drip, aspirin and beta-blockers   Abdominal pain/lactic acidosis -Continue IV fluids -Lactic acid peaked to 3.9>>> 1.4 -01/19/2018 CT abdomen and pelvis--no acute findings; diverticulosis in the left colon without inflammatory changes.  Cholelithiasis without cholecystitis, hepatic steatosis -Urinalysis negative for pyuria -As the patient is afebrile and hemodynamically stable, hold off on antibiotics; suspect patient is volume depleted -Blood cultures x2 sets -Presently abdominal exam is benign  CKD stage III -Baseline creatinine 1.2-1.5, creatinine appears to be at baseline, continue to monitor, continue with IV fluid as likely will have cardiac cath  Essential hypertension -Continue bisoprolol -Holding lisinopril and HCTZ in preparation for heart catheterization  Hypokalemia -Repleted -Magnesium 2.0  Impaired glucose tolerance -Check hemoglobin A1c -11/15/2017 hemoglobin A1C--5.6  Chronic back pain -Continue home dose of Percocet -The [place] Controlled Substance Reporting System has been queried for this patient--no red flags  Morbid Obesity -BMI 55.51 -lifestyle modification -Discussed with patient need for sleep study as an outpatient     Disposition Plan:   Home when stable  Family Communication:  None at bedside  Consultants:  Cardiology  Code  Status:  FULL   DVT Prophylaxis: Heparin GTT   Procedures: As Listed in Progress Note Above  Antibiotics: None    Subjective: Patient denies any chest pain today, no further diarrhea, nausea or vomiting Objective: Vitals:   01/20/18 1743 01/21/18 0000 01/21/18 0346 01/21/18 0808  BP: 124/89 133/89  (!) 144/87  Pulse: 85   85  Resp:    19  Temp: 98.1 F (36.7 C) 97.8 F (36.6 C) (!) 97.4 F (36.3 C) 98.1 F (36.7 C)  TempSrc: Oral Oral Oral Oral  SpO2: 94%   100%  Weight: (!) 181.9 kg     Height: 5\' 11"  (1.803 m)       Intake/Output Summary (Last 24 hours) at 01/21/2018 1212 Last data filed at 01/21/2018 0900 Gross per 24 hour  Intake 3645.53 ml  Output 275 ml  Net 3370.53 ml   Weight change: 1.361 kg Exam:  Awake Alert, Oriented X 3, No new F.N deficits, Normal affect Symmetrical Chest wall movement, Good air movement bilaterally, CTAB RRR,No Gallops,Rubs or new Murmurs, No Parasternal Heave +ve B.Sounds, Abd Soft, No tenderness, No rebound - guarding or rigidity. No Cyanosis, Clubbing or edema, No new Rash or bruise      Data Reviewed: I have personally reviewed following labs and imaging studies Basic Metabolic Panel: Recent Labs  Lab 01/19/18 1753 01/19/18 1918 01/20/18 0208 01/21/18 0723  NA 136  --  138 139  K 3.3*  --  3.7 3.3*  CL 100  --  102 105  CO2 26  --  30 24  GLUCOSE 156*  --  165* 141*  BUN 22*  --  22* 17  CREATININE 1.62*  --  1.58* 1.41*  CALCIUM 8.8*  --  8.6* 8.6*  MG  --  2.0  --   --    Liver Function Tests: Recent Labs  Lab 01/19/18 1753  AST 30  ALT 29  ALKPHOS 55  BILITOT 0.8  PROT 7.5  ALBUMIN 3.7   Recent Labs  Lab 01/19/18 1753  LIPASE 31   No results for input(s): AMMONIA in the last 168 hours. Coagulation Profile: No results for input(s): INR, PROTIME in the last 168 hours. CBC: Recent Labs  Lab 01/19/18 1753 01/20/18 0208 01/21/18 0723  WBC 11.6* 10.3 7.4  NEUTROABS 9.4*  --   --   HGB 13.7  12.9* 11.7*  HCT 41.1 40.3 36.7*  MCV 96.7 98.8 98.1  PLT 257 231 198   Cardiac Enzymes: Recent Labs  Lab 01/19/18 1753 01/19/18 1918 01/19/18 2146 01/20/18 0208 01/20/18 0556  TROPONINI 0.32* 0.33* 0.31* 0.23* 0.19*   BNP: Invalid input(s): POCBNP CBG: Recent Labs  Lab 01/20/18 0115 01/20/18 0856 01/20/18 1301 01/20/18 2127 01/21/18 0806  GLUCAP 218* 144* 144* 156* 143*   HbA1C: Recent Labs    01/20/18 0209  HGBA1C 6.2*   Urine analysis:    Component Value Date/Time   COLORURINE YELLOW 01/19/2018 1946   APPEARANCEUR CLEAR 01/19/2018 1946   LABSPEC 1.036 (H) 01/19/2018 1946   PHURINE 5.0 01/19/2018 1946   GLUCOSEU NEGATIVE 01/19/2018 1946   HGBUR NEGATIVE 01/19/2018 1946   [place] NEGATIVE 01/19/2018 1946   [place] NEGATIVE 01/19/2018 1946   PROTEINUR NEGATIVE 01/19/2018 1946   NITRITE NEGATIVE 01/19/2018 1946   LEUKOCYTESUR NEGATIVE 01/19/2018 1946   Sepsis Labs: @LABRCNTIP (procalcitonin:4,lacticidven:4) ) Recent Results (from the past 240 hour(s))  MRSA PCR Screening     Status:  None   Collection Time: 01/20/18  6:07 PM  Result Value Ref Range Status   MRSA by PCR NEGATIVE NEGATIVE Final    Comment:        The GeneXpert MRSA Assay (FDA approved for NASAL specimens only), is one component of a comprehensive MRSA colonization surveillance program. It is not intended to diagnose MRSA infection nor to guide or monitor treatment for MRSA infections. Performed at [facility], [place] [address] [place], [place] [address]      Scheduled Meds: . aspirin EC  81 mg Oral Daily  . atorvastatin  80 mg Oral q1800  . bisoprolol  5 mg Oral Daily  . insulin aspart  0-15 Units Subcutaneous TID WC  . insulin aspart  0-5 Units Subcutaneous QHS  . potassium chloride  40 mEq Oral Daily  . potassium chloride  40 mEq Oral Once   Continuous Infusions: . sodium chloride 100 mL/hr at 01/21/18 0800  . heparin 2,000 Units/hr (01/20/18 1928)     Procedures/Studies: Dg Chest 2 View  Result Date: 01/19/2018 CLINICAL DATA:  Shortness of Breath.  Hypertension. EXAM: CHEST - 2 VIEW COMPARISON:  Chest radiograph March 09, 2010; chest CT March 09, 2010 FINDINGS: There is no edema or consolidation. The heart size and pulmonary vascularity are normal. No adenopathy. No bone lesions. IMPRESSION: No edema or consolidation. Electronically Signed   By: Lowella Grip III M.D.   On: 01/19/2018 18:54   Ct Abdomen Pelvis W Contrast  Result Date: 01/19/2018 CLINICAL DATA:  Abdominal pain, unspecified.  Symptoms for 2 weeks. EXAM: CT ABDOMEN AND PELVIS WITH CONTRAST TECHNIQUE: Multidetector CT imaging of the abdomen and pelvis was performed using the standard protocol following bolus administration of intravenous contrast. CONTRAST:  159mL ISOVUE-300 IOPAMIDOL (ISOVUE-300) INJECTION 61% COMPARISON:  None. FINDINGS: Lower chest:  No contributory findings. Hepatobiliary: Hepatic steatosis.Layering gallstone. No evidence of biliary obstruction or inflammation. Pancreas: Unremarkable. Spleen: Unremarkable. Adrenals/Urinary Tract: Negative adrenals. 3.2 cm upper pole cyst on the right. Symmetric normal renal enhancement otherwise. No stone or hydronephrosis. Unremarkable bladder. Stomach/Bowel: No obstruction. No appendicitis. Left colonic diverticulosis. Vascular/Lymphatic: No acute vascular abnormality. Atherosclerotic calcification. No mass or adenopathy. Reproductive:Prostate calcification. Other: No ascites or pneumoperitoneum. Musculoskeletal: No acute abnormalities. L4-5 PLIF with solid arthrodesis. IMPRESSION: 1. No acute finding. 2. Hepatic steatosis, cholelithiasis, and colonic diverticulosis. Electronically Signed   By: Monte Fantasia M.D.   On: 01/19/2018 19:18    Phillips Climes, MD  [facility] Pager [phone]  If 7PM-7AM, please contact night-coverage [email or web address] Password [email or web address] 01/21/2018, 12:12 PM   LOS: 2 days

## 2018-01-21 NOTE — Progress Notes (Signed)
Long discussion w/patient regarding lifestyle changes. Patient states he knows he needs to make changes (I.e. Dietary changes, increased activity levels, wearing of recommended CPAP, compliance w/physician orders/recommendations) however he also realizes he has little self-motivation and what he considers no one else around to help him be motivated.  Patient encouraged to start w/small changes and work his way up to bigger lifestyle changes.  Patient is in agreement to work w/cardiac rehab if offered prior to discharge for f/u post-acute.  Patient is also in agreement to f/u w/sleep study and CPAP trials, now w/better understanding of effects of sleep apnea.  Presently, patient is awaiting heart cath, most likely on Tuesday of this week.  Remains on an IV Heparin gtt and MIV fluids at 153ml/hr.  Will continue to monitor closely.

## 2018-01-22 LAB — HEPARIN LEVEL (UNFRACTIONATED): HEPARIN UNFRACTIONATED: 0.5 [IU]/mL (ref 0.30–0.70)

## 2018-01-22 LAB — GLUCOSE, CAPILLARY
GLUCOSE-CAPILLARY: 178 mg/dL — AB (ref 70–99)
GLUCOSE-CAPILLARY: 121 mg/dL — AB (ref 70–99)
Glucose-Capillary: 119 mg/dL — ABNORMAL HIGH (ref 70–99)
Glucose-Capillary: 148 mg/dL — ABNORMAL HIGH (ref 70–99)

## 2018-01-22 LAB — CBC
HEMATOCRIT: 36.9 % — AB (ref 39.0–52.0)
Hemoglobin: 11.6 g/dL — ABNORMAL LOW (ref 13.0–17.0)
MCH: 31.4 pg (ref 26.0–34.0)
MCHC: 31.4 g/dL (ref 30.0–36.0)
MCV: 100 fL (ref 80.0–100.0)
Platelets: 220 10*3/uL (ref 150–400)
RBC: 3.69 MIL/uL — ABNORMAL LOW (ref 4.22–5.81)
RDW: 12.8 % (ref 11.5–15.5)
WBC: 8.9 10*3/uL (ref 4.0–10.5)
nRBC: 0 % (ref 0.0–0.2)

## 2018-01-22 LAB — BASIC METABOLIC PANEL
Anion gap: 6 (ref 5–15)
BUN: 19 mg/dL (ref 6–20)
CHLORIDE: 107 mmol/L (ref 98–111)
CO2: 25 mmol/L (ref 22–32)
CREATININE: 1.46 mg/dL — AB (ref 0.61–1.24)
Calcium: 8.5 mg/dL — ABNORMAL LOW (ref 8.9–10.3)
GFR calc non Af Amer: 52 mL/min — ABNORMAL LOW (ref >60)
Glucose, Bld: 129 mg/dL — ABNORMAL HIGH (ref 70–99)
Potassium: 3.9 mmol/L (ref 3.5–5.1)
Sodium: 138 mmol/L (ref 135–145)

## 2018-01-22 MED ORDER — SODIUM CHLORIDE 0.9 % WEIGHT BASED INFUSION
1.0000 mL/kg/h | INTRAVENOUS | Status: DC
  Administered 2018-01-23: 1 mL/kg/h via INTRAVENOUS

## 2018-01-22 MED ORDER — SODIUM CHLORIDE 0.9 % WEIGHT BASED INFUSION
3.0000 mL/kg/h | INTRAVENOUS | Status: DC
  Administered 2018-01-23: 3 mL/kg/h via INTRAVENOUS

## 2018-01-22 MED ORDER — ASPIRIN 81 MG PO CHEW
81.0000 mg | CHEWABLE_TABLET | ORAL | Status: AC
  Administered 2018-01-23: 81 mg via ORAL
  Filled 2018-01-22: qty 1

## 2018-01-22 MED ORDER — PNEUMOCOCCAL VAC POLYVALENT 25 MCG/0.5ML IJ INJ: 0.5000 mL | INJECTION | INTRAMUSCULAR | Status: DC

## 2018-01-22 MED ORDER — SODIUM CHLORIDE 0.9% FLUSH: 3.0000 mL | INTRAVENOUS | Status: DC | PRN

## 2018-01-22 MED ORDER — SODIUM CHLORIDE 0.9% FLUSH
3.0000 mL | Freq: Two times a day (BID) | INTRAVENOUS | Status: DC
  Administered 2018-01-22: 3 mL via INTRAVENOUS

## 2018-01-22 MED ORDER — SODIUM CHLORIDE 0.9 % IV SOLN: 250.0000 mL | INTRAVENOUS | Status: DC | PRN

## 2018-01-22 MED ORDER — INFLUENZA VAC SPLIT QUAD 0.5 ML IM SUSY: 0.5000 mL | PREFILLED_SYRINGE | INTRAMUSCULAR | Status: DC

## 2018-01-22 NOTE — H&P (View-Only) (Signed)
Progress Note  Patient Name: Bradley Torres Date of Encounter: 01/22/2018  Primary Cardiologist: No primary care provider on file.   Subjective   Tires out easily, feels short of breath. Anticipating cath tomorrow.  Inpatient Medications    Scheduled Meds: . [START ON 01/23/2018] aspirin  81 mg Oral Pre-Cath  . aspirin EC  81 mg Oral Daily  . atorvastatin  80 mg Oral q1800  . bisoprolol  5 mg Oral Daily  . [START ON 01/23/2018] Influenza vac split quadrivalent PF  0.5 mL Intramuscular Tomorrow-1000  . insulin aspart  0-15 Units Subcutaneous TID WC  . insulin aspart  0-5 Units Subcutaneous QHS  . [START ON 01/23/2018] pneumococcal 23 valent vaccine  0.5 mL Intramuscular Tomorrow-1000  . potassium chloride  40 mEq Oral Daily  . sodium chloride flush  3 mL Intravenous Q12H   Continuous Infusions: . sodium chloride 100 mL/hr at 01/22/18 1344  . sodium chloride    . [START ON 01/23/2018] sodium chloride     Followed by  . [START ON 01/23/2018] sodium chloride    . heparin 2,400 Units/hr (01/22/18 1841)   PRN Meds: sodium chloride, acetaminophen, cyclobenzaprine, nitroGLYCERIN, ondansetron (ZOFRAN) IV, sodium chloride flush   Vital Signs    Vitals:   01/22/18 0802 01/22/18 1214 01/22/18 1539 01/22/18 1954  BP: 113/74 115/61 (!) 131/99 (!) 162/92  Pulse: 73 63 80 78  Resp: (!) 22 (!) 21 (!) 22 20  Temp: 97.6 F (36.4 C) 97.9 F (36.6 C) 98 F (36.7 C) 97.8 F (36.6 C)  TempSrc: Oral Oral Oral Oral  SpO2: 92% 95% 96% (!) 78%  Weight:      Height:        Intake/Output Summary (Last 24 hours) at 01/22/2018 2029 Last data filed at 01/22/2018 1500 Gross per 24 hour  Intake 600 ml  Output 200 ml  Net 400 ml   Filed Weights   01/19/18 1731 01/20/18 1743  Weight: (!) 180.5 kg (!) 181.9 kg     ECG    No new - Personally Reviewed  Physical Exam   GEN: No acute distress.   Neck: No JVD Cardiac: RRR, no murmurs, rubs, or gallops.  Respiratory: Clear to  auscultation bilaterally. GI: Soft, nontender, non-distended  MS: bilateral edema; No deformity. Neuro:  Nonfocal  Psych: Normal affect   Labs    Chemistry Recent Labs  Lab 01/19/18 1753 01/20/18 0208 01/21/18 0723 01/22/18 0249  NA 136 138 139 138  K 3.3* 3.7 3.3* 3.9  CL 100 102 105 107  CO2 26 30 24 25   GLUCOSE 156* 165* 141* 129*  BUN 22* 22* 17 19  CREATININE 1.62* 1.58* 1.41* 1.46*  CALCIUM 8.8* 8.6* 8.6* 8.5*  PROT 7.5  --   --   --   ALBUMIN 3.7  --   --   --   AST 30  --   --   --   ALT 29  --   --   --   ALKPHOS 55  --   --   --   BILITOT 0.8  --   --   --   GFRNONAA 46* 47* 54* 52*  GFRAA 53* 55* >60 >60  ANIONGAP 10 6 10 6      Hematology Recent Labs  Lab 01/20/18 0208 01/21/18 0723 01/22/18 0249  WBC 10.3 7.4 8.9  RBC 4.08* 3.74* 3.69*  HGB 12.9* 11.7* 11.6*  HCT 40.3 36.7* 36.9*  MCV 98.8 98.1 100.0  MCH 31.6 31.3 31.4  MCHC 32.0 31.9 31.4  RDW 12.7 12.7 12.8  PLT 231 198 220    Cardiac Enzymes Recent Labs  Lab 01/19/18 1918 01/19/18 2146 01/20/18 0208 01/20/18 0556  TROPONINI 0.33* 0.31* 0.23* 0.19*   No results for input(s): TROPIPOC in the last 168 hours.   BNPNo results for input(s): BNP, PROBNP in the last 168 hours.   DDimer No results for input(s): DDIMER in the last 168 hours.   Radiology    No results found.  Cardiac Studies   Cath pending  Patient Profile     CHRISTROPHER GINTZ is a 57 y.o. male with a hx of hypertension and morbid obesity who is being seen today for the evaluation of chest discomfort and worsening shortness of breath and elevated troponin level at the request of  Dr. Waldron Labs.   Assessment & Plan    Principal Problem:   NSTEMI (non-ST elevated myocardial infarction) [facility]) Active Problems:   Essential hypertension   Back pain   Diarrhea   Lactic acidosis   Hypokalemia   Acute kidney injury superimposed on chronic kidney disease (HCC)   CKD (chronic kidney disease) stage 3, GFR 30-59 ml/min  (HCC)   Morbid obesity with BMI of 50.0-59.9, adult ([place])  NSTEMI/UA - plan for cath tomorrow. Consented by Dr. Acie Fredrickson.   Dyspnea - his dyspnea may be due to deconditioning after multiple medical illnesses. If his cath does not demonstrate significant disease that could be producing symptoms, could consider updating his echocardiogram to determine if he has had a drop in ejection fraction or change in pulmonary artery pressure.  For questions or updates, please contact [place] Please consult [email or web address] for contact info under        Signed, Elouise Munroe, MD  01/22/2018, 8:29 PM

## 2018-01-22 NOTE — Progress Notes (Signed)
PROGRESS NOTE  Bradley Torres TRR:[id number] DOB: 1960/07/18 DOA: 01/19/2018 PCP: Terald Sleeper, PA-C  Brief History:  57 year old male with a history of impaired glucose tolerance, hypertension, diverticulosis, morbid obesity, chronic back pain presenting with left-sided chest discomfort and abdominal pain.  On 12/26/2017, the patient was prescribed Cipro and Flagyl for presumptive diverticulitis.  The patient had symptoms of left lower quadrant abdominal pain with loose stools.  He stated that his abdominal pain and loose stools improved on the metronidazole and ciprofloxacin.  He endorses compliance with his medications.  He stated that approximately 2 days prior to this admission he began developing 1-2 loose stools again without any hematochezia or melena.  In addition, the patient had cramping lower abdominal pain.  He went to see his primary care provider on the day of admission.  Because of relative hypotension and abdominal pain, there was concern for worsening infection.  As result, the patient was sent to emergency department for further evaluation.  CT of the abdomen and pelvis in the emergency department showed fatty liver, cholelithiasis without cholecystitis, and diverticulosis without any inflammation.  There were no other acute changes.  Patient has not had any bowel movements in over 24 hours.  His last bowel movement was in the early morning 01/19/2018. The patient states that he has had intermittent chest discomfort with exertion for the better part of 2 years.  However in the past 2 to 3 days, he has had worsening shortness of breath and dyspnea on exertion in addition to his exertional chest discomfort.  He denies any nausea, vomiting, dysuria, hematuria, cough, hemoptysis.  In the emergency department, patient was afebrile hemodynamically stable saturating 96% on room air.  Initial BMP and LFTs were unremarkable with lipase 31.  WBC was 11.6.  CT of the abdomen and pelvis did  not show any acute findings.  Chest x-ray was negative.  EKG shows sinus rhythm with T wave inversions diffusely.  Patient had elevated troponin.  As result, the patient was started on IV heparin.  Cardiology was consulted.  The patient will be transferred to [facility] for cardiac catheterization.  Assessment/Plan:  Elevated troponin/NSTEMI -Troponin trend relatively flat--0.32--0.33--0.31, currently trending down, cardiology input greatly appreciated, he received aspirin, continue with heparin GTT, plan for cardiac cath today. -LDL is 98, Started on Lipitor  Abdominal pain/lactic acidosis -01/19/2018 CT abdomen and pelvis--no acute findings; diverticulosis in the left colon without inflammatory changes.  Cholelithiasis without cholecystitis, hepatic steatosis -Urinalysis negative for pyuria -As the patient is afebrile and hemodynamically stable, hold off on antibiotics; suspect patient is volume depleted -Blood cultures x2 sets -Presently abdominal exam is benign  CKD stage III -Baseline creatinine 1.2-1.5, creatinine appears to be at baseline, continue to monitor, continue with IV fluid today as he is planned to have cardiac cath this afternoon .  Essential hypertension -Continue bisoprolol -Holding lisinopril and HCTZ in preparation for heart catheterization  Hypokalemia -Repleted -Magnesium 2.0  Impaired glucose tolerance -Check hemoglobin A1c -11/15/2017 hemoglobin A1C--5.6  Chronic back pain -Continue home dose of Percocet -The [place] Controlled Substance Reporting System has been queried for this patient--no red flags  Morbid Obesity -BMI 55.51 -lifestyle modification -Discussed with patient need for sleep study as an outpatient     Disposition Plan:   Home when stable  Family Communication:   None at bedside  Consultants:  Cardiology  Code Status:  FULL   DVT Prophylaxis: Heparin GTT  Procedures: As Listed in Progress Note  Above  Antibiotics: None    Subjective: Patient reports some dyspnea, denies any chest pain, no diarrhea Objective: Vitals:   01/21/18 1903 01/21/18 2300 01/22/18 0245 01/22/18 0802  BP: (!) 141/96 96/74 135/79 113/74  Pulse: 85 85  73  Resp: 19 (!) 24  (!) 22  Temp: (!) 97.4 F (36.3 C) 97.7 F (36.5 C) (!) 97.5 F (36.4 C) 97.6 F (36.4 C)  TempSrc: Oral Oral Oral Oral  SpO2:   93% 92%  Weight:      Height:        Intake/Output Summary (Last 24 hours) at 01/22/2018 1036 Last data filed at 01/22/2018 0929 Gross per 24 hour  Intake 1589.26 ml  Output 1000 ml  Net 589.26 ml   Weight change:  Exam:  Awake Alert, Oriented X 3, No new F.N deficits, Normal affect Symmetrical Chest wall movement, Good air movement bilaterally, CTAB RRR,No Gallops,Rubs or new Murmurs, No Parasternal Heave +ve B.Sounds, Abd Soft, No tenderness, No rebound - guarding or rigidity. No Cyanosis, Clubbing or edema, No new Rash or bruise       Data Reviewed: I have personally reviewed following labs and imaging studies Basic Metabolic Panel: Recent Labs  Lab 01/19/18 1753 01/19/18 1918 01/20/18 0208 01/21/18 0723 01/22/18 0249  NA 136  --  138 139 138  K 3.3*  --  3.7 3.3* 3.9  CL 100  --  102 105 107  CO2 26  --  30 24 25   GLUCOSE 156*  --  165* 141* 129*  BUN 22*  --  22* 17 19  CREATININE 1.62*  --  1.58* 1.41* 1.46*  CALCIUM 8.8*  --  8.6* 8.6* 8.5*  MG  --  2.0  --   --   --    Liver Function Tests: Recent Labs  Lab 01/19/18 1753  AST 30  ALT 29  ALKPHOS 55  BILITOT 0.8  PROT 7.5  ALBUMIN 3.7   Recent Labs  Lab 01/19/18 1753  LIPASE 31   No results for input(s): AMMONIA in the last 168 hours. Coagulation Profile: No results for input(s): INR, PROTIME in the last 168 hours. CBC: Recent Labs  Lab 01/19/18 1753 01/20/18 0208 01/21/18 0723 01/22/18 0249  WBC 11.6* 10.3 7.4 8.9  NEUTROABS 9.4*  --   --   --   HGB 13.7 12.9* 11.7* 11.6*  HCT 41.1 40.3  36.7* 36.9*  MCV 96.7 98.8 98.1 100.0  PLT 257 231 198 220   Cardiac Enzymes: Recent Labs  Lab 01/19/18 1753 01/19/18 1918 01/19/18 2146 01/20/18 0208 01/20/18 0556  TROPONINI 0.32* 0.33* 0.31* 0.23* 0.19*   BNP: Invalid input(s): POCBNP CBG: Recent Labs  Lab 01/21/18 0806 01/21/18 1224 01/21/18 1657 01/21/18 2058 01/22/18 0753  GLUCAP 143* 219* 138* 143* 119*   HbA1C: Recent Labs    01/20/18 0209  HGBA1C 6.2*   Urine analysis:    Component Value Date/Time   COLORURINE YELLOW 01/19/2018 1946   APPEARANCEUR CLEAR 01/19/2018 1946   LABSPEC 1.036 (H) 01/19/2018 1946   PHURINE 5.0 01/19/2018 1946   GLUCOSEU NEGATIVE 01/19/2018 1946   HGBUR NEGATIVE 01/19/2018 1946   BILIRUBINUR NEGATIVE 01/19/2018 1946   [place] NEGATIVE 01/19/2018 1946   PROTEINUR NEGATIVE 01/19/2018 1946   NITRITE NEGATIVE 01/19/2018 1946   LEUKOCYTESUR NEGATIVE 01/19/2018 1946   Sepsis Labs: @LABRCNTIP (procalcitonin:4,lacticidven:4) ) Recent Results (from the past 240 hour(s))  MRSA PCR Screening     Status: None  Collection Time: 01/20/18  6:07 PM  Result Value Ref Range Status   MRSA by PCR NEGATIVE NEGATIVE Final    Comment:        The GeneXpert MRSA Assay (FDA approved for NASAL specimens only), is one component of a comprehensive MRSA colonization surveillance program. It is not intended to diagnose MRSA infection nor to guide or monitor treatment for MRSA infections. Performed at [facility], [place] [address] [place], [place] [address]      Scheduled Meds: . aspirin EC  81 mg Oral Daily  . atorvastatin  80 mg Oral q1800  . bisoprolol  5 mg Oral Daily  . [START ON 01/23/2018] Influenza vac split quadrivalent PF  0.5 mL Intramuscular Tomorrow-1000  . insulin aspart  0-15 Units Subcutaneous TID WC  . insulin aspart  0-5 Units Subcutaneous QHS  . [START ON 01/23/2018] pneumococcal 23 valent vaccine  0.5 mL Intramuscular Tomorrow-1000  . potassium chloride  40 mEq  Oral Daily   Continuous Infusions: . sodium chloride 100 mL/hr at 01/22/18 0342  . heparin 2,400 Units/hr (01/22/18 0751)    Procedures/Studies: Dg Chest 2 View  Result Date: 01/19/2018 CLINICAL DATA:  Shortness of Breath.  Hypertension. EXAM: CHEST - 2 VIEW COMPARISON:  Chest radiograph March 09, 2010; chest CT March 09, 2010 FINDINGS: There is no edema or consolidation. The heart size and pulmonary vascularity are normal. No adenopathy. No bone lesions. IMPRESSION: No edema or consolidation. Electronically Signed   By: Lowella Grip III M.D.   On: 01/19/2018 18:54   Ct Abdomen Pelvis W Contrast  Result Date: 01/19/2018 CLINICAL DATA:  Abdominal pain, unspecified.  Symptoms for 2 weeks. EXAM: CT ABDOMEN AND PELVIS WITH CONTRAST TECHNIQUE: Multidetector CT imaging of the abdomen and pelvis was performed using the standard protocol following bolus administration of intravenous contrast. CONTRAST:  182mL ISOVUE-300 IOPAMIDOL (ISOVUE-300) INJECTION 61% COMPARISON:  None. FINDINGS: Lower chest:  No contributory findings. Hepatobiliary: Hepatic steatosis.Layering gallstone. No evidence of biliary obstruction or inflammation. Pancreas: Unremarkable. Spleen: Unremarkable. Adrenals/Urinary Tract: Negative adrenals. 3.2 cm upper pole cyst on the right. Symmetric normal renal enhancement otherwise. No stone or hydronephrosis. Unremarkable bladder. Stomach/Bowel: No obstruction. No appendicitis. Left colonic diverticulosis. Vascular/Lymphatic: No acute vascular abnormality. Atherosclerotic calcification. No mass or adenopathy. Reproductive:Prostate calcification. Other: No ascites or pneumoperitoneum. Musculoskeletal: No acute abnormalities. L4-5 PLIF with solid arthrodesis. IMPRESSION: 1. No acute finding. 2. Hepatic steatosis, cholelithiasis, and colonic diverticulosis. Electronically Signed   By: Monte Fantasia M.D.   On: 01/19/2018 19:18    Phillips Climes, MD  [facility] Pager  [phone]  If 7PM-7AM, please contact night-coverage [email or web address] Password [email or web address] 01/22/2018, 10:36 AM   LOS: 3 days

## 2018-01-22 NOTE — Progress Notes (Signed)
[facility] for Heparin Indication: CP/ACS  Allergies  Allergen Reactions  . Ibuprofen Other (See Comments)    Rectal bleed  . Diflucan [Fluconazole] Rash  . Losartan Rash    Patient Measurements: Height: 5\' 11"  (180.3 cm) Weight: (!) 401 lb (181.9 kg) IBW/kg (Calculated) : 75.3 Heparin Dosing Weight: 120 kg  Vital Signs: Temp: 97.6 F (36.4 C) (10/28 0802) Temp Source: Oral (10/28 0802) BP: 113/74 (10/28 0802) Pulse Rate: 73 (10/28 0802)  Labs: Recent Labs    01/19/18 2146 01/20/18 0208 01/20/18 0556  01/21/18 0723 01/21/18 2158 01/22/18 0249  HGB  --  12.9*  --   --  11.7*  --  11.6*  HCT  --  40.3  --   --  36.7*  --  36.9*  PLT  --  231  --   --  198  --  220  HEPARINUNFRC  --  0.15*  --    < > 0.22* 0.44 0.50  CREATININE  --  1.58*  --   --  1.41*  --  1.46*  TROPONINI 0.31* 0.23* 0.19*  --   --   --   --    < > = values in this interval not displayed.    Estimated Creatinine Clearance: 94.2 mL/min (A) (by C-G formula based on SCr of 1.46 mg/dL (H)).   Medical History: Past Medical History:  Diagnosis Date  . Chronic back pain   . Essential hypertension   . Morbid obesity ([place])   . Panic disorder   . Prediabetes   . Shoulder pain, left    Following fall   Assessment: Pharmacy consulted to dose heparin for patient with CP/ACS. Hgb 12.9. Troponin mildly elevated but trending down. Planning cath.   Heparin level at goal this morning, patient is for add-on cath today or possibly tomorrow if bumped. No bleeding noted. CBC stable.   Goal of Therapy:  Heparin level 0.3-0.7 units/ml Monitor platelets by anticoagulation protocol: Yes   Plan:  Continue heparin at 2400 units/hr Confirmatory heparin level with AM labs Monitor for bleeding  Erin Hearing PharmD., BCPS Clinical Pharmacist 01/22/2018 8:07 AM

## 2018-01-22 NOTE — Progress Notes (Signed)
Progress Note  Patient Name: Bradley Torres Date of Encounter: 01/22/2018  Primary Cardiologist: No primary care provider on file.   Subjective   Tires out easily, feels short of breath. Anticipating cath tomorrow.  Inpatient Medications    Scheduled Meds: . [START ON 01/23/2018] aspirin  81 mg Oral Pre-Cath  . aspirin EC  81 mg Oral Daily  . atorvastatin  80 mg Oral q1800  . bisoprolol  5 mg Oral Daily  . [START ON 01/23/2018] Influenza vac split quadrivalent PF  0.5 mL Intramuscular Tomorrow-1000  . insulin aspart  0-15 Units Subcutaneous TID WC  . insulin aspart  0-5 Units Subcutaneous QHS  . [START ON 01/23/2018] pneumococcal 23 valent vaccine  0.5 mL Intramuscular Tomorrow-1000  . potassium chloride  40 mEq Oral Daily  . sodium chloride flush  3 mL Intravenous Q12H   Continuous Infusions: . sodium chloride 100 mL/hr at 01/22/18 1344  . sodium chloride    . [START ON 01/23/2018] sodium chloride     Followed by  . [START ON 01/23/2018] sodium chloride    . heparin 2,400 Units/hr (01/22/18 1841)   PRN Meds: sodium chloride, acetaminophen, cyclobenzaprine, nitroGLYCERIN, ondansetron (ZOFRAN) IV, sodium chloride flush   Vital Signs    Vitals:   01/22/18 0802 01/22/18 1214 01/22/18 1539 01/22/18 1954  BP: 113/74 115/61 (!) 131/99 (!) 162/92  Pulse: 73 63 80 78  Resp: (!) 22 (!) 21 (!) 22 20  Temp: 97.6 F (36.4 C) 97.9 F (36.6 C) 98 F (36.7 C) 97.8 F (36.6 C)  TempSrc: Oral Oral Oral Oral  SpO2: 92% 95% 96% (!) 78%  Weight:      Height:        Intake/Output Summary (Last 24 hours) at 01/22/2018 2029 Last data filed at 01/22/2018 1500 Gross per 24 hour  Intake 600 ml  Output 200 ml  Net 400 ml   Filed Weights   01/19/18 1731 01/20/18 1743  Weight: (!) 180.5 kg (!) 181.9 kg     ECG    No new - Personally Reviewed  Physical Exam   GEN: No acute distress.   Neck: No JVD Cardiac: RRR, no murmurs, rubs, or gallops.  Respiratory: Clear to  auscultation bilaterally. GI: Soft, nontender, non-distended  MS: bilateral edema; No deformity. Neuro:  Nonfocal  Psych: Normal affect   Labs    Chemistry Recent Labs  Lab 01/19/18 1753 01/20/18 0208 01/21/18 0723 01/22/18 0249  NA 136 138 139 138  K 3.3* 3.7 3.3* 3.9  CL 100 102 105 107  CO2 26 30 24 25   GLUCOSE 156* 165* 141* 129*  BUN 22* 22* 17 19  CREATININE 1.62* 1.58* 1.41* 1.46*  CALCIUM 8.8* 8.6* 8.6* 8.5*  PROT 7.5  --   --   --   ALBUMIN 3.7  --   --   --   AST 30  --   --   --   ALT 29  --   --   --   ALKPHOS 55  --   --   --   BILITOT 0.8  --   --   --   GFRNONAA 46* 47* 54* 52*  GFRAA 53* 55* >60 >60  ANIONGAP 10 6 10 6      Hematology Recent Labs  Lab 01/20/18 0208 01/21/18 0723 01/22/18 0249  WBC 10.3 7.4 8.9  RBC 4.08* 3.74* 3.69*  HGB 12.9* 11.7* 11.6*  HCT 40.3 36.7* 36.9*  MCV 98.8 98.1 100.0  MCH 31.6 31.3 31.4  MCHC 32.0 31.9 31.4  RDW 12.7 12.7 12.8  PLT 231 198 220    Cardiac Enzymes Recent Labs  Lab 01/19/18 1918 01/19/18 2146 01/20/18 0208 01/20/18 0556  TROPONINI 0.33* 0.31* 0.23* 0.19*   No results for input(s): TROPIPOC in the last 168 hours.   BNPNo results for input(s): BNP, PROBNP in the last 168 hours.   DDimer No results for input(s): DDIMER in the last 168 hours.   Radiology    No results found.  Cardiac Studies   Cath pending  Patient Profile     Bradley Torres is a 57 y.o. male with a hx of hypertension and morbid obesity who is being seen today for the evaluation of chest discomfort and worsening shortness of breath and elevated troponin level at the request of  Dr. Waldron Labs.   Assessment & Plan    Principal Problem:   NSTEMI (non-ST elevated myocardial infarction) [facility]) Active Problems:   Essential hypertension   Back pain   Diarrhea   Lactic acidosis   Hypokalemia   Acute kidney injury superimposed on chronic kidney disease (HCC)   CKD (chronic kidney disease) stage 3, GFR 30-59 ml/min  (HCC)   Morbid obesity with BMI of 50.0-59.9, adult ([place])  NSTEMI/UA - plan for cath tomorrow. Consented by Dr. Acie Fredrickson.   Dyspnea - his dyspnea may be due to deconditioning after multiple medical illnesses. If his cath does not demonstrate significant disease that could be producing symptoms, could consider updating his echocardiogram to determine if he has had a drop in ejection fraction or change in pulmonary artery pressure.  For questions or updates, please contact [place] Please consult [email or web address] for contact info under        Signed, Elouise Munroe, MD  01/22/2018, 8:29 PM

## 2018-01-22 NOTE — Progress Notes (Signed)
Dr. Margaretann Loveless saw patient today; cath orders written per our discussion. Dayna Dunn PA-C

## 2018-01-23 ENCOUNTER — Encounter (HOSPITAL_COMMUNITY): Payer: Self-pay | Admitting: Cardiovascular Disease

## 2018-01-23 ENCOUNTER — Inpatient Hospital Stay (HOSPITAL_COMMUNITY): Payer: Medicaid Other

## 2018-01-23 ENCOUNTER — Encounter (HOSPITAL_COMMUNITY)
Admission: EM | Disposition: A | Discharge status: Home / Self Care | Payer: Self-pay | Source: Home / Self Care | Attending: Internal Medicine

## 2018-01-23 DIAGNOSIS — I2721 Secondary pulmonary arterial hypertension: Secondary | ICD-10-CM

## 2018-01-23 DIAGNOSIS — I214 Non-ST elevation (NSTEMI) myocardial infarction: Secondary | ICD-10-CM

## 2018-01-23 DIAGNOSIS — I361 Nonrheumatic tricuspid (valve) insufficiency: Secondary | ICD-10-CM

## 2018-01-23 DIAGNOSIS — R7989 Other specified abnormal findings of blood chemistry: Secondary | ICD-10-CM

## 2018-01-23 HISTORY — PX: LEFT HEART CATH AND CORONARY ANGIOGRAPHY: CATH118249

## 2018-01-23 LAB — CBC
HCT: 35.1 % — ABNORMAL LOW (ref 39.0–52.0)
Hemoglobin: 11.4 g/dL — ABNORMAL LOW (ref 13.0–17.0)
MCH: 32.3 pg (ref 26.0–34.0)
MCHC: 32.5 g/dL (ref 30.0–36.0)
MCV: 99.4 fL (ref 80.0–100.0)
NRBC: 0 % (ref 0.0–0.2)
PLATELETS: 197 10*3/uL (ref 150–400)
RBC: 3.53 MIL/uL — AB (ref 4.22–5.81)
RDW: 12.9 % (ref 11.5–15.5)
WBC: 7.7 10*3/uL (ref 4.0–10.5)

## 2018-01-23 LAB — BASIC METABOLIC PANEL
ANION GAP: 5 (ref 5–15)
BUN: 18 mg/dL (ref 6–20)
CALCIUM: 8.6 mg/dL — AB (ref 8.9–10.3)
CO2: 23 mmol/L (ref 22–32)
Chloride: 111 mmol/L (ref 98–111)
Creatinine, Ser: 1.52 mg/dL — ABNORMAL HIGH (ref 0.61–1.24)
GFR, EST AFRICAN AMERICAN: 57 mL/min — AB (ref >60)
GFR, EST NON AFRICAN AMERICAN: 50 mL/min — AB (ref >60)
GLUCOSE: 119 mg/dL — AB (ref 70–99)
Potassium: 4.3 mmol/L (ref 3.5–5.1)
SODIUM: 139 mmol/L (ref 135–145)

## 2018-01-23 LAB — GLUCOSE, CAPILLARY
GLUCOSE-CAPILLARY: 146 mg/dL — AB (ref 70–99)
GLUCOSE-CAPILLARY: 119 mg/dL — AB (ref 70–99)
Glucose-Capillary: 119 mg/dL — ABNORMAL HIGH (ref 70–99)

## 2018-01-23 LAB — PROTIME-INR
INR: 1.03
Prothrombin Time: 13.4 seconds (ref 11.4–15.2)

## 2018-01-23 LAB — ECHOCARDIOGRAM COMPLETE
HEIGHTINCHES: 71 in
WEIGHTICAEL: 6416 [oz_av]

## 2018-01-23 LAB — HEPARIN LEVEL (UNFRACTIONATED): HEPARIN UNFRACTIONATED: 0.46 [IU]/mL (ref 0.30–0.70)

## 2018-01-23 SURGERY — LEFT HEART CATH AND CORONARY ANGIOGRAPHY
Anesthesia: LOCAL

## 2018-01-23 MED ORDER — ONDANSETRON HCL 4 MG/2ML IJ SOLN: 4.0000 mg | Freq: Four times a day (QID) | INTRAMUSCULAR | Status: DC | PRN

## 2018-01-23 MED ORDER — LIDOCAINE HCL (PF) 1 % IJ SOLN
INTRAMUSCULAR | Status: AC
  Filled 2018-01-23: qty 30

## 2018-01-23 MED ORDER — IOHEXOL 350 MG/ML SOLN
INTRAVENOUS | Status: DC | PRN
  Administered 2018-01-23: 55 mL via INTRACARDIAC

## 2018-01-23 MED ORDER — HEPARIN (PORCINE) IN NACL 1000-0.9 UT/500ML-% IV SOLN
INTRAVENOUS | Status: AC
  Filled 2018-01-23: qty 1000

## 2018-01-23 MED ORDER — DIAZEPAM 5 MG PO TABS: 5.0000 mg | ORAL_TABLET | Freq: Four times a day (QID) | ORAL | Status: DC | PRN

## 2018-01-23 MED ORDER — MIDAZOLAM HCL 2 MG/2ML IJ SOLN
INTRAMUSCULAR | Status: DC | PRN
  Administered 2018-01-23 (×2): 1 mg via INTRAVENOUS

## 2018-01-23 MED ORDER — MIDAZOLAM HCL 2 MG/2ML IJ SOLN
INTRAMUSCULAR | Status: AC
  Filled 2018-01-23: qty 2

## 2018-01-23 MED ORDER — FENTANYL CITRATE (PF) 100 MCG/2ML IJ SOLN
INTRAMUSCULAR | Status: AC
  Filled 2018-01-23: qty 2

## 2018-01-23 MED ORDER — FENTANYL CITRATE (PF) 100 MCG/2ML IJ SOLN
INTRAMUSCULAR | Status: DC | PRN
  Administered 2018-01-23: 25 ug via INTRAVENOUS

## 2018-01-23 MED ORDER — ACETAMINOPHEN 325 MG PO TABS: 650.0000 mg | ORAL_TABLET | ORAL | Status: DC | PRN

## 2018-01-23 MED ORDER — SODIUM CHLORIDE 0.9% FLUSH: 3.0000 mL | Freq: Two times a day (BID) | INTRAVENOUS | Status: DC

## 2018-01-23 MED ORDER — SODIUM CHLORIDE 0.9 % IV SOLN: 250.0000 mL | INTRAVENOUS | Status: DC | PRN

## 2018-01-23 MED ORDER — VERAPAMIL HCL 2.5 MG/ML IV SOLN
INTRAVENOUS | Status: DC | PRN
  Administered 2018-01-23: 10 mL via INTRA_ARTERIAL

## 2018-01-23 MED ORDER — SODIUM CHLORIDE 0.9 % IV SOLN
150.0000 mL/h | INTRAVENOUS | Status: AC
  Administered 2018-01-23: 150 mL/h via INTRAVENOUS

## 2018-01-23 MED ORDER — VERAPAMIL HCL 2.5 MG/ML IV SOLN
INTRAVENOUS | Status: AC
  Filled 2018-01-23: qty 2

## 2018-01-23 MED ORDER — OXYCODONE HCL 5 MG PO TABS: 5.0000 mg | ORAL_TABLET | Freq: Two times a day (BID) | ORAL | Status: DC

## 2018-01-23 MED ORDER — OXYCODONE-ACETAMINOPHEN 5-325 MG PO TABS
1.0000 | ORAL_TABLET | Freq: Two times a day (BID) | ORAL | Status: DC
  Administered 2018-01-23: 1 via ORAL
  Filled 2018-01-23: qty 1

## 2018-01-23 MED ORDER — SODIUM CHLORIDE 0.9% FLUSH: 3.0000 mL | INTRAVENOUS | Status: DC | PRN

## 2018-01-23 MED ORDER — OXYCODONE-ACETAMINOPHEN 10-325 MG PO TABS: 1.0000 | ORAL_TABLET | Freq: Two times a day (BID) | ORAL | Status: DC

## 2018-01-23 MED ORDER — PERFLUTREN LIPID MICROSPHERE
4.0000 mL | Freq: Once | INTRAVENOUS | Status: AC
  Administered 2018-01-23: 4 mL via INTRAVENOUS
  Filled 2018-01-23: qty 4

## 2018-01-23 MED ORDER — HEPARIN (PORCINE) IN NACL 1000-0.9 UT/500ML-% IV SOLN
INTRAVENOUS | Status: DC | PRN
  Administered 2018-01-23 (×2): 500 mL

## 2018-01-23 MED ORDER — HEPARIN SODIUM (PORCINE) 1000 UNIT/ML IJ SOLN
INTRAMUSCULAR | Status: DC | PRN
  Administered 2018-01-23: 7000 [IU] via INTRAVENOUS

## 2018-01-23 MED ORDER — LIDOCAINE HCL (PF) 1 % IJ SOLN
INTRAMUSCULAR | Status: DC | PRN
  Administered 2018-01-23: 2 mL

## 2018-01-23 MED ORDER — ATORVASTATIN CALCIUM 20 MG PO TABS: 20.0000 mg | ORAL_TABLET | Freq: Every day | ORAL | 0 refills | Status: DC

## 2018-01-23 MED ORDER — HEPARIN SODIUM (PORCINE) 5000 UNIT/ML IJ SOLN: 5000.0000 [IU] | Freq: Three times a day (TID) | INTRAMUSCULAR | Status: DC

## 2018-01-23 SURGICAL SUPPLY — 11 items
CATH INFINITI 5FR ANG PIGTAIL (CATHETERS) ×1 IMPLANT
CATH OPTITORQUE TIG 4.0 5F (CATHETERS) ×1 IMPLANT
DEVICE RAD COMP TR BAND LRG (VASCULAR PRODUCTS) ×1 IMPLANT
GLIDESHEATH SLEND SS 6F .021 (SHEATH) ×1 IMPLANT
GUIDEWIRE INQWIRE 1.5J.035X260 (WIRE) IMPLANT
INQWIRE 1.5J .035X260CM (WIRE) ×2
KIT HEART LEFT (KITS) ×2 IMPLANT
PACK CARDIAC CATHETERIZATION (CUSTOM PROCEDURE TRAY) ×2 IMPLANT
SYR MEDRAD MARK V 150ML (SYRINGE) ×2 IMPLANT
TRANSDUCER W/STOPCOCK (MISCELLANEOUS) ×2 IMPLANT
TUBING CIL FLEX 10 FLL-RA (TUBING) ×2 IMPLANT

## 2018-01-23 NOTE — Interval H&P Note (Signed)
Cath Lab Visit (complete for each Cath Lab visit)  Clinical Evaluation Leading to the Procedure:   ACS: Yes.    Non-ACS:    Anginal Classification: CCS III  Anti-ischemic medical therapy: Minimal Therapy (1 class of medications)  Non-Invasive Test Results: No non-invasive testing performed  Prior CABG: No previous CABG      History and Physical Interval Note:  01/23/2018 7:37 AM  [name]  has presented today for surgery, with the diagnosis of NSTEMI  The various methods of treatment have been discussed with the patient and family. After consideration of risks, benefits and other options for treatment, the patient has consented to  Procedure(s): LEFT HEART CATH AND CORONARY ANGIOGRAPHY (N/A) as a surgical intervention .  The patient's history has been reviewed, patient examined, no change in status, stable for surgery.  I have reviewed the patient's chart and labs.  Questions were answered to the patient's satisfaction.     Shelva Majestic

## 2018-01-23 NOTE — Progress Notes (Signed)
  Echocardiogram 2D Echocardiogram has been performed.  Madelaine Etienne 01/23/2018, 12:18 PM

## 2018-01-23 NOTE — Discharge Instructions (Signed)
Follow with Primary MD Terald Sleeper, PA-C in 7 days   Get CBC, CMP,  checked  by Primary MD next visit.    Activity: As tolerated with Full fall precautions use walker/cane & assistance as needed   Disposition Home    Diet: Heart Healthy  , carbohydrate modified, with feeding assistance and aspiration precautions.  For Heart failure patients - Check your Weight same time everyday, if you gain over 2 pounds, or you develop in leg swelling, experience more shortness of breath or chest pain, call your Primary MD immediately. Follow Cardiac Low Salt Diet and 1.5 lit/day fluid restriction.   On your next visit with your primary care physician please Get Medicines reviewed and adjusted.   Please request your Prim.MD to go over all Hospital Tests and Procedure/Radiological results at the follow up, please get all Hospital records sent to your Prim MD by signing hospital release before you go home.   If you experience worsening of your admission symptoms, develop shortness of breath, life threatening emergency, suicidal or homicidal thoughts you must seek medical attention immediately by calling 911 or calling your MD immediately  if symptoms less severe.  You Must read complete instructions/literature along with all the possible adverse reactions/side effects for all the Medicines you take and that have been prescribed to you. Take any new Medicines after you have completely understood and accpet all the possible adverse reactions/side effects.   Do not drive, operating heavy machinery, perform activities at heights, swimming or participation in water activities or provide baby sitting services if your were admitted for syncope or siezures until you have seen by Primary MD or a Neurologist and advised to do so again.  Do not drive when taking Pain medications.    Do not take more than prescribed Pain, Sleep and Anxiety Medications  Special Instructions: If you have smoked or chewed  Tobacco  in the last 2 yrs please stop smoking, stop any regular Alcohol  and or any Recreational drug use.  Wear Seat belts while driving.   Please note  You were cared for by a hospitalist during your hospital stay. If you have any questions about your discharge medications or the care you received while you were in the hospital after you are discharged, you can call the unit and asked to speak with the hospitalist on call if the hospitalist that took care of you is not available. Once you are discharged, your primary care physician will handle any further medical issues. Please note that NO REFILLS for any discharge medications will be authorized once you are discharged, as it is imperative that you return to your primary care physician (or establish a relationship with a primary care physician if you do not have one) for your aftercare needs so that they can reassess your need for medications and monitor your lab values.

## 2018-01-23 NOTE — Progress Notes (Signed)
Progress Note  Patient Name: Bradley Torres Date of Encounter: 01/23/2018  Primary Cardiologist: No primary care provider on file.   Subjective   Going for cath today  Inpatient Medications    Scheduled Meds: . aspirin EC  81 mg Oral Daily  . atorvastatin  80 mg Oral q1800  . bisoprolol  5 mg Oral Daily  . [START ON 01/24/2018] heparin  5,000 Units Subcutaneous Q8H  . Influenza vac split quadrivalent PF  0.5 mL Intramuscular Tomorrow-1000  . insulin aspart  0-15 Units Subcutaneous TID WC  . insulin aspart  0-5 Units Subcutaneous QHS  . oxyCODONE-acetaminophen  1 tablet Oral BID   And  . oxyCODONE  5 mg Oral BID  . pneumococcal 23 valent vaccine  0.5 mL Intramuscular Tomorrow-1000  . potassium chloride  40 mEq Oral Daily  . sodium chloride flush  3 mL Intravenous Q12H   Continuous Infusions: . sodium chloride 100 mL/hr at 01/22/18 2351  . sodium chloride     PRN Meds: sodium chloride, acetaminophen, cyclobenzaprine, diazepam, nitroGLYCERIN, ondansetron (ZOFRAN) IV, sodium chloride flush   Vital Signs    Vitals:   01/23/18 1200 01/23/18 1309 01/23/18 1422 01/23/18 1547  BP:  (!) 162/109  (!) 143/90  Pulse:  84  71  Resp: 20 18 (!) 28 19  Temp:  97.9 F (36.6 C)  (!) 97.4 F (36.3 C)  TempSrc:  Oral  Oral  SpO2:  96%  96%  Weight:      Height:        Intake/Output Summary (Last 24 hours) at 01/23/2018 2029 Last data filed at 01/23/2018 1200 Gross per 24 hour  Intake 2918.26 ml  Output 300 ml  Net 2618.26 ml   Filed Weights   01/19/18 1731 01/20/18 1743  Weight: (!) 180.5 kg (!) 181.9 kg     ECG    No new - Personally Reviewed  Physical Exam   GEN: No acute distress.   Neck: No JVD Cardiac: RRR, no murmurs, rubs, or gallops.  Respiratory: Clear to auscultation bilaterally. GI: Soft, nontender, non-distended  MS: bilateral edema; No deformity. Neuro:  Nonfocal  Psych: Normal affect   Labs    Chemistry Recent Labs  Lab 01/19/18 1753   01/21/18 0723 01/22/18 0249 01/23/18 0222  NA 136   < > 139 138 139  K 3.3*   < > 3.3* 3.9 4.3  CL 100   < > 105 107 111  CO2 26   < > 24 25 23   GLUCOSE 156*   < > 141* 129* 119*  BUN 22*   < > 17 19 18   CREATININE 1.62*   < > 1.41* 1.46* 1.52*  CALCIUM 8.8*   < > 8.6* 8.5* 8.6*  PROT 7.5  --   --   --   --   ALBUMIN 3.7  --   --   --   --   AST 30  --   --   --   --   ALT 29  --   --   --   --   ALKPHOS 55  --   --   --   --   BILITOT 0.8  --   --   --   --   GFRNONAA 46*   < > 54* 52* 50*  GFRAA 53*   < > >60 >60 57*  ANIONGAP 10   < > 10 6 5    < > = values in  this interval not displayed.     Hematology Recent Labs  Lab 01/21/18 0723 01/22/18 0249 01/23/18 0222  WBC 7.4 8.9 7.7  RBC 3.74* 3.69* 3.53*  HGB 11.7* 11.6* 11.4*  HCT 36.7* 36.9* 35.1*  MCV 98.1 100.0 99.4  MCH 31.3 31.4 32.3  MCHC 31.9 31.4 32.5  RDW 12.7 12.8 12.9  PLT 198 220 197    Cardiac Enzymes Recent Labs  Lab 01/19/18 1918 01/19/18 2146 01/20/18 0208 01/20/18 0556  TROPONINI 0.33* 0.31* 0.23* 0.19*   No results for input(s): TROPIPOC in the last 168 hours.   BNPNo results for input(s): BNP, PROBNP in the last 168 hours.   DDimer No results for input(s): DDIMER in the last 168 hours.   Radiology    No results found.  Cardiac Studies   Cath pending  Patient Profile     FERREL SIMINGTON is a 57 y.o. male with a hx of hypertension and morbid obesity who is being seen today for the evaluation of chest discomfort and worsening shortness of breath and elevated troponin level at the request of  Dr. Waldron Labs.   Assessment & Plan    Active Problems:   Essential hypertension   Back pain   Diarrhea   Lactic acidosis   Hypokalemia   Acute kidney injury superimposed on chronic kidney disease (HCC)   CKD (chronic kidney disease) stage 3, GFR 30-59 ml/min (HCC)   Morbid obesity with BMI of 50.0-59.9, adult (HCC)  NSTEMI/UA - No significant CAD. Elevated LVEDP at 18 mmHg.   Dyspnea  - Echocardiogram showed severely elevated RVSP 64 mmHg suggesting severe pulmonary HTN. This is likely secondary to obstructive sleep apnea, and the patient should be evaluated for that with his PCP or cardiology. This may explain his symptoms of fatigue and dyspnea.  [facility] will sign off.   Medication Recommendations: no change Other recommendations (labs, testing, etc):  Evaluation for sleep apnea given pulmonary hypertension Follow up as an outpatient:  1 month with cardiology.        Signed, Elouise Munroe, MD  01/23/2018, 8:29 PM

## 2018-01-23 NOTE — Discharge Summary (Addendum)
Bradley Torres, is a 57 y.o. male  DOB 04/24/1960  MRN [id number].  Admission date:  01/19/2018  Admitting Physician  Orson Eva, MD  Discharge Date:  01/23/2018   Primary MD  Terald Sleeper, PA-C  Recommendations for primary care physician for things to follow:  -Need referral for sleep study as an outpatient -Patient will need further work-up for pulmonary  artery hypertension as an outpatient -Please check CBC, BMP during next visit  Admission Diagnosis  NSTEMI (non-ST elevation myocardial infarction) ([place]) [I21.4] Diarrhea in adult patient [R19.7] Left lower quadrant abdominal pain [R10.32]   Discharge Diagnosis      Elevated troponins  Active Problems:   Essential hypertension   Back pain   Diarrhea   Lactic acidosis   Hypokalemia   Acute kidney injury superimposed on chronic kidney disease (HCC)   CKD (chronic kidney disease) stage 3, GFR 30-59 ml/min (HCC)   Morbid obesity with BMI of 50.0-59.9, adult [facility])      Past Medical History:  Diagnosis Date  . Chronic back pain   . Essential hypertension   . Morbid obesity ([place])   . Panic disorder   . Prediabetes   . Shoulder pain, left    Following fall    Past Surgical History:  Procedure Laterality Date  . Ankle fracture sugery Left   . COLONOSCOPY WITH PROPOFOL N/A 05/29/2017   Procedure: COLONOSCOPY WITH PROPOFOL;  Surgeon: Daneil Dolin, MD;  [facility];  Service: Endoscopy;  Laterality: N/A;  2:00pm  . LEFT HEART CATH AND CORONARY ANGIOGRAPHY N/A 01/23/2018   Procedure: LEFT HEART CATH AND CORONARY ANGIOGRAPHY;  Surgeon: Troy Sine, MD;  Location: [place] [facility];  Service: Cardiovascular;  Laterality: N/A;  . LUMBAR SPINE SURGERY     fusion  . POLYPECTOMY  05/29/2017   Procedure: POLYPECTOMY;  Surgeon: Daneil Dolin, MD;  Location: [facility];  Service: Endoscopy;;  ascending colon polyp  x5-cs transverse colon polyp x4- cs descending colon polyp x1- hs sigmoid colon polyp x1 -hs rectal polyp x1- hs  . TONSILLECTOMY         History of present illness and  Hospital Course:     Kindly see H&P for history of present illness and admission details, please review complete Labs, Consult reports and Test reports for all details in brief  HPI  from the history and physical done on the day of admission 01/19/2018  HPI: Bradley Torres is a 57 y.o. male with a history of hypertension, chronic back pain, morbid obesity, prediabetes, diffuse diverticulosis.  Patient was sent here from his PCPs office.  At the beginning of the month, the patient called GI and was given a prescription for Cipro and Flagyl for presumed diverticulitis due to abdominal pain.  Patient took the antibiotics, although was only taking the Flagyl twice a day, but finished the prescription.  During this time, the patient's pain and loose stools improved.  2 days ago, the patient had very sharp lower  left abdominal pain that was nonradiating.  The pain woke him up.  He had very loose, dark stools that, per his primary care physician's note, was mixed with pus.  At the same time, the patient had some severe aching left-sided chest pain with radiation into his shoulder that was accompanied with diaphoresis and shortness of breath.  After using the toilet, the patient returned to his bed and fell asleep.  When he awoke, his chest pain had resolved.  However, since that time the patient has continued to have shortness of breath that is worse with exertion of any type, including rolling over in bed.  His symptoms are improved with rest and he has minimal shortness of breath while at rest.  He currently does not have any chest pain.  Emergency Department Course: Troponin elevated to 0.32 with repeat of 0.33.  Lactic acid also elevated to 3.9.  White count 11.6.  Creatinine 1.62.  CT of the abdomen showed diverticulosis without  any acute findings.   Hospital Course   57 year old male with a history of impaired glucose tolerance, hypertension, diverticulosis, morbid obesity, chronic back pain presenting with left-sided chest discomfort and abdominal pain.  On 12/26/2017, the patient was prescribed Cipro and Flagyl for presumptive diverticulitis.  The patient had symptoms of left lower quadrant abdominal pain with loose stools.  He stated that his abdominal pain and loose stools improved on the metronidazole and ciprofloxacin.  He endorses compliance with his medications.  He stated that approximately 2 days prior to this admission he began developing 1-2 loose stools again without any hematochezia or melena.  In addition, the patient had cramping lower abdominal pain.  He went to see his primary care provider on the day of admission.  Because of relative hypotension and abdominal pain, there was concern for worsening infection.  As result, the patient was sent to emergency department for further evaluation.  CT of the abdomen and pelvis in the emergency department showed fatty liver, cholelithiasis without cholecystitis, and diverticulosis without any inflammation.  There were no other acute changes.  Patient has not had any bowel movements in over 24 hours.  His last bowel movement was in the early morning 01/19/2018. The patient states that he has had intermittent chest discomfort with exertion for the better part of 2 years.  However in the past 2 to 3 days, he has had worsening shortness of breath and dyspnea on exertion in addition to his exertional chest discomfort.  He denies any nausea, vomiting, dysuria, hematuria, cough, hemoptysis.  In the emergency department, patient was afebrile hemodynamically stable saturating 96% on room air.  Initial BMP and LFTs were unremarkable with lipase 31.  WBC was 11.6.  CT of the abdomen and pelvis did not show any acute findings.  Chest x-ray was negative.  EKG shows sinus rhythm with T  wave inversions diffusely.  Patient had elevated troponin.  As result, the patient was started on IV heparin.  Cardiology was consulted.  The patient will be transferred to [facility] for cardiac catheterization.  Pulmonary artery hypertension -Please see above discussion  Elevated troponin - No NSTEMI, is most likely demand ischemia secondary to dehydration, lactic acidosis -Initially on heparin GTT, went for cardiac cath today, which was only significant for 10% stenosis in proximal LAD, otherwise no sniffing and coronary artery disease. -DL was at 98, started on Lipitor -Patient with significant dyspnea, elevated troponin, 2D echo was obtained, preserved EF, but was significant for PE H at 63,  so work-up significant for pulmonary artery hypertension, I have recommended that he go to his PCP to schedule sleep study she has been resistant to so far, he understand the importance of the follow-up, as will will need further work-up as an outpatient for monitoring artery hypertension, have discussed with cardiology who will arrange for outpatient follow-up.  Abdominal pain/lactic acidosis -01/19/2018 CT abdomen and pelvis--no acute findings; diverticulosis in the left colon without inflammatory changes.  Cholelithiasis without cholecystitis, hepatic steatosis -Urinalysis negative for pyuria -As the patient is afebrile and hemodynamically stable, hold off on antibiotics; suspect patient is volume depleted -Blood cultures x2 sets -Presently abdominal exam is benign  CKD stage III -Baseline creatinine 1.2-1.5, creatinine appears to be at baseline, continue to monitor  Essential hypertension -resume home meds  Hypokalemia  -Repleted  Impaired glucose tolerance -Check hemoglobin A1c -11/15/2017 hemoglobin A1C--5.6  Chronic back pain -Continue home dose of Percocet -The [place] Controlled Substance Reporting System has been queried for this patient--no red flags  Morbid  Obesity -BMI 55.51 -lifestyle modification -Discussed with patient need for sleep study as an outpatient    Discharge Condition: Stable   Follow UP  Follow-up Information    Terald Sleeper, PA-C Follow up in 1 week(s).   Specialties:  Physician Assistant, Family Medicine Contact information: [place] [address] [phone]        Herminio Commons, MD Follow up.   Specialty:  Cardiology Contact information: [place] [address] [phone]             Discharge Instructions  and  Discharge Medications    Discharge Instructions    Discharge instructions   Complete by:  As directed    Follow with Primary MD Terald Sleeper, PA-C in 7 days   Get CBC, CMP,  checked  by Primary MD next visit.    Activity: As tolerated with Full fall precautions use walker/cane & assistance as needed   Disposition Home    Diet: Heart Healthy  , carbohydrate modified, with feeding assistance and aspiration precautions.  For Heart failure patients - Check your Weight same time everyday, if you gain over 2 pounds, or you develop in leg swelling, experience more shortness of breath or chest pain, call your Primary MD immediately. Follow Cardiac Low Salt Diet and 1.5 lit/day fluid restriction.   On your next visit with your primary care physician please Get Medicines reviewed and adjusted.   Please request your Prim.MD to go over all Hospital Tests and Procedure/Radiological results at the follow up, please get all Hospital records sent to your Prim MD by signing hospital release before you go home.   If you experience worsening of your admission symptoms, develop shortness of breath, life threatening emergency, suicidal or homicidal thoughts you must seek medical attention immediately by calling 911 or calling your MD immediately  if symptoms less severe.  You Must read complete instructions/literature along with all the possible adverse  reactions/side effects for all the Medicines you take and that have been prescribed to you. Take any new Medicines after you have completely understood and accpet all the possible adverse reactions/side effects.   Do not drive, operating heavy machinery, perform activities at heights, swimming or participation in water activities or provide baby sitting services if your were admitted for syncope or siezures until you have seen by Primary MD or a Neurologist and advised to do so again.  Do not drive when  taking Pain medications.    Do not take more than prescribed Pain, Sleep and Anxiety Medications  Special Instructions: If you have smoked or chewed Tobacco  in the last 2 yrs please stop smoking, stop any regular Alcohol  and or any Recreational drug use.  Wear Seat belts while driving.   Please note  You were cared for by a hospitalist during your hospital stay. If you have any questions about your discharge medications or the care you received while you were in the hospital after you are discharged, you can call the unit and asked to speak with the hospitalist on call if the hospitalist that took care of you is not available. Once you are discharged, your primary care physician will handle any further medical issues. Please note that NO REFILLS for any discharge medications will be authorized once you are discharged, as it is imperative that you return to your primary care physician (or establish a relationship with a primary care physician if you do not have one) for your aftercare needs so that they can reassess your need for medications and monitor your lab values.   Increase activity slowly   Complete by:  As directed      Allergies as of 01/23/2018      Reactions   Ibuprofen Other (See Comments)   Rectal bleed   Diflucan [fluconazole] Rash   Losartan Rash      Medication List    STOP taking these medications   ciprofloxacin 500 MG tablet Commonly known as:  CIPRO   FLAGYL  500 MG tablet Generic drug:  metroNIDAZOLE     TAKE these medications   atorvastatin 20 MG tablet Commonly known as:  LIPITOR Take 1 tablet (20 mg total) by mouth daily.   bisoprolol-hydrochlorothiazide 10-6.25 MG tablet Commonly known as:  ZIAC Take 1 tablet by mouth daily.   cyclobenzaprine 10 MG tablet Commonly known as:  FLEXERIL TAKE 1 TABLET BY MOUTH THREE TIMES DAILY AS NEEDED What changed:    how much to take  how to take this  when to take this   lisinopril-hydrochlorothiazide 20-25 MG tablet Commonly known as:  PRINZIDE,ZESTORETIC Take 1 tablet by mouth daily.   oxyCODONE-acetaminophen 10-325 MG tablet Commonly known as:  PERCOCET Take 1 tablet by mouth 2 (two) times daily. What changed:  Another medication with the same name was removed. Continue taking this medication, and follow the directions you see here.   terbinafine 1 % cream Commonly known as:  LAMISIL Apply 1 application topically 2 (two) times daily as needed (FOR ITCHY SKIN.).         Diet and Activity recommendation: See Discharge Instructions above   Consults obtained - Cardiology   Major procedures and Radiology Reports - PLEASE review detailed and final reports for all details, in brief -   Cardiac catheterization 01/23/2018  Prox LAD lesion is 10% stenosed.  The left ventricular ejection fraction is 50-55% by visual estimate.  LV end diastolic pressure is mildly elevated.  The left ventricular systolic function is normal.   No significant coronary obstructive disease with essentially normal coronary arteries and only mild luminal irregularity of the proximal LAD of 10%; normal left circumflex and normal dominant RCA.  Low normal global LV function with an ejection fraction of 50 to 55% without definitive focal segmental wall motion abnormalities.  LVEDP is 18 mmHg.  RECOMMENDATION: Medical therapy.  Weight loss is essential.  The patient should be evaluated for obstructive  sleep apnea.  No indication for antiplatelet therapy at this time.    ------------------------------------------------------------------- Transthoracic Echocardiography  Patient:    Armaan, Pond MR #:       [id number] Study Date: 01/23/2018 Gender:     M Age:        74 Height:     180.3 cm Weight:     181.9 kg BSA:        3.12 m^2 Pt. Status: Room:       [address]    [place] [id number]  [id number]     [id number], Mohsen Odenthal S  REFERRING    Samyia Motter, [place] Capri [id number]  PERFORMING   [facility], Inpatient  SONOGRAPHER  Madelaine Etienne  cc:  ------------------------------------------------------------------- LV EF: 60% -   65%  ------------------------------------------------------------------- Indications:      Dyspnea 786.09.  ------------------------------------------------------------------- Study Conclusions  - Left ventricle: The cavity size was normal. Systolic function was   normal. The estimated ejection fraction was in the range of 60%   to 65%. Wall motion was normal; there were no regional wall   motion abnormalities. The study was not technically sufficient to   allow evaluation of LV diastolic dysfunction due to atrial   fibrillation. - Aortic valve: Trileaflet; normal thickness, mildly calcified   leaflets. Valve area (VTI): 2.65 cm^2. Valve area (Vmean): 3.21   cm^2. - Mitral valve: Calcified annulus. - Right ventricle: The cavity size was moderately dilated. Wall   thickness was normal. - Right atrium: The atrium was mildly dilated. - Tricuspid valve: There was moderate regurgitation. - Pulmonary arteries: PA peak pressure: 64 mm Hg (S).  Impressions:  - The right ventricular systolic pressure was increased consistent   with moderate pulmonary hypertension.  ------------------------------------------------------------------- Study data:  Comparison was made to the study of 04/06/2017.  Study status:   Routine.  Procedure:  The patient reported no pain pre or post test. Transthoracic echocardiography. Image quality was suboptimal. The study was technically difficult, as a result of poor acoustic windows, poor sound wave transmission, poor patient compliance, and body habitus.  Study completion:  There were no complications.          Transthoracic echocardiography.  M-mode, complete 2D, spectral Doppler, and color Doppler.  Birthdate: Patient birthdate: May 27, 1960.  Age:  Patient is 57 yr old.  Sex: Gender: male.    BMI: 56 kg/m^2.  Blood pressure:     165/87 Patient status:  Inpatient.  Study date:  Study date: 01/23/2018. Study time: 11:41 AM.  Location:  ICU/CCU  -------------------------------------------------------------------  ------------------------------------------------------------------- Left ventricle:  The cavity size was normal. Systolic function was normal. The estimated ejection fraction was in the range of 60% to 65%. Wall motion was normal; there were no regional wall motion abnormalities. Normal sinus rhythm was absent. The study was not technically sufficient to allow evaluation of LV diastolic dysfunction due to atrial fibrillation.  ------------------------------------------------------------------- Aortic valve:   Trileaflet; normal thickness, mildly calcified leaflets. Mobility was not restricted.  Doppler:  Transvalvular velocity was within the normal range. There was no stenosis. There was no regurgitation.    VTI ratio of LVOT to aortic valve: 0.77. Valve area (VTI): 2.65 cm^2. Indexed valve area (VTI): 0.85 cm^2/m^2. Mean velocity ratio of LVOT to aortic valve: 0.93. Valve area (Vmean): 3.21 cm^2. Indexed valve area (Vmean): 1.03 cm^2/m^2.    Mean gradient (S): 5 mm Hg.  ------------------------------------------------------------------- Aorta:  Aortic root: The aortic root was normal in  size.  -------------------------------------------------------------------  Mitral valve:   Calcified annulus. Mobility was not restricted. Doppler:  Transvalvular velocity was within the normal range. There was no evidence for stenosis. There was no regurgitation.  ------------------------------------------------------------------- Left atrium:  The atrium was normal in size.  ------------------------------------------------------------------- Right ventricle:  The cavity size was moderately dilated. Wall thickness was normal. Systolic function was normal.  ------------------------------------------------------------------- Pulmonic valve:    Structurally normal valve.   Cusp separation was normal.  Doppler:  Transvalvular velocity was within the normal range. There was no evidence for stenosis. There was no regurgitation.  ------------------------------------------------------------------- Tricuspid valve:   Structurally normal valve.    Doppler: Transvalvular velocity was within the normal range. There was moderate regurgitation.  ------------------------------------------------------------------- Pulmonary artery:   The main pulmonary artery was normal-sized. Systolic pressure was within the normal range.  ------------------------------------------------------------------- Right atrium:  The atrium was mildly dilated.  ------------------------------------------------------------------- Pericardium:  There was no pericardial effusion.  ------------------------------------------------------------------- Systemic veins: Inferior vena cava: The vessel was normal in size. The respirophasic diameter changes were in the normal range (>= 50%), consistent with normal central venous pressure.  ------------------------------------------------------------------- Measurements   Left ventricle                            Value          Reference  LV ID, ED, PLAX chordal            (L)     42.5  mm       43 - 52  LV ID, ES, PLAX chordal                   27.6  mm       23 - 38  LV fx shortening, PLAX chordal            35    %        >=29  LV PW thickness, ED                       9.38  mm       ---------  IVS/LV PW ratio, ED                       1.09           <=1.3  Stroke volume, 2D                         84    ml       ---------  Stroke volume/bsa, 2D                     27    ml/m^2   ---------  LV ejection fraction, 1-p A4C             56    %        ---------  LV end-diastolic volume, 2-p              116   ml       ---------  LV end-systolic volume, 2-p               51    ml       ---------  LV ejection fraction, 2-p                 56    %        ---------  Stroke volume, 2-p                        65    ml       ---------  LV end-diastolic volume/bsa, 2-p          37    ml/m^2   ---------  LV end-systolic volume/bsa, 2-p           16    ml/m^2   ---------  Stroke volume/bsa, 2-p                    20.8  ml/m^2   ---------  LV e&', lateral                            12.8  cm/s     ---------  LV E/e&', lateral                          3.98           ---------  LV s&', lateral                            11.4  cm/s     ---------  LV e&', medial                             11.7  cm/s     ---------  LV E/e&', medial                           4.35           ---------  LV e&', average                            12.25 cm/s     ---------  LV E/e&', average                          4.16           ---------    Ventricular septum                        Value          Reference  IVS thickness, ED                         10.2  mm       ---------    LVOT                                      Value          Reference  LVOT ID, S                                21    mm       ---------  LVOT area  3.46  cm^2     ---------  LVOT mean velocity, S                     95.5  cm/s     ---------  LVOT VTI, S                                24.4  cm       ---------    Aortic valve                              Value          Reference  Aortic valve mean velocity, S             103   cm/s     ---------  Aortic valve VTI, S                       31.8  cm       ---------  Aortic mean gradient, S                   5     mm Hg    ---------  VTI ratio, LVOT/AV                        0.77           ---------  Aortic valve area, VTI                    2.65  cm^2     ---------  Aortic valve area/bsa, VTI                0.85  cm^2/m^2 ---------  Velocity ratio, mean, LVOT/AV             0.93           ---------  Aortic valve area, mean velocity          3.21  cm^2     ---------  Aortic valve area/bsa, mean               1.03  cm^2/m^2 ---------  velocity    Aorta                                     Value          Reference  Aortic root ID, ED                        34    mm       ---------  Ascending aorta ID, A-P, S                34    mm       ---------    Left atrium                               Value          Reference  LA ID, A-P, ES  33    mm       ---------  LA ID/bsa, A-P                            1.06  cm/m^2   <=2.2  LA volume, S                              76.9  ml       ---------  LA volume/bsa, S                          24.6  ml/m^2   ---------  LA volume, ES, 1-p A4C                    83.4  ml       ---------  LA volume/bsa, ES, 1-p A4C                26.7  ml/m^2   ---------  LA volume, ES, 1-p A2C                    69.2  ml       ---------  LA volume/bsa, ES, 1-p A2C                22.2  ml/m^2   ---------    Mitral valve                              Value          Reference  Mitral E-wave peak velocity               50.9  cm/s     ---------  Mitral A-wave peak velocity               66.9  cm/s     ---------  Mitral deceleration time          (L)     148   ms       150 - 230  Mitral E/A ratio, peak                    0.8            ---------    Pulmonary arteries                         Value          Reference  PA pressure, S, DP                (H)     64    mm Hg    <=30    Tricuspid valve                           Value          Reference  Tricuspid regurg peak velocity            390   cm/s     ---------  Tricuspid peak RV-RA gradient             61    mm Hg    ---------    Systemic veins  Value          Reference  Estimated CVP                             3     mm Hg    ---------    Right ventricle                           Value          Reference  RV ID, minor axis, ED, A4C base           49.2  mm       ---------  RV ID, minor axis, ED, A4C mid            41.9  mm       ---------  RV ID, major axis, ED, A4C                86.2  mm       55 - 91  TAPSE                                     18.5  mm       ---------  RV pressure, S, DP                (H)     64    mm Hg    <=30  RV s&', lateral, S                         10.8  cm/s     ---------    Pulmonic valve                            Value          Reference  Pulmonic valve peak velocity, S           95    cm/s     ---------  Legend: (L)  and  (H)  mark values outside specified reference range.  ------------------------------------------------------------------- Prepared and Electronically Authenticated by  Fransico Him, MD 2019-10-29T15:07:23  Dg Chest 2 View  Result Date: 01/19/2018 CLINICAL DATA:  Shortness of Breath.  Hypertension. EXAM: CHEST - 2 VIEW COMPARISON:  Chest radiograph March 09, 2010; chest CT March 09, 2010 FINDINGS: There is no edema or consolidation. The heart size and pulmonary vascularity are normal. No adenopathy. No bone lesions. IMPRESSION: No edema or consolidation. Electronically Signed   By: Lowella Grip III M.D.   On: 01/19/2018 18:54   Ct Abdomen Pelvis W Contrast  Result Date: 01/19/2018 CLINICAL DATA:  Abdominal pain, unspecified.  Symptoms for 2 weeks. EXAM: CT ABDOMEN AND PELVIS WITH CONTRAST TECHNIQUE: Multidetector CT  imaging of the abdomen and pelvis was performed using the standard protocol following bolus administration of intravenous contrast. CONTRAST:  149mL ISOVUE-300 IOPAMIDOL (ISOVUE-300) INJECTION 61% COMPARISON:  None. FINDINGS: Lower chest:  No contributory findings. Hepatobiliary: Hepatic steatosis.Layering gallstone. No evidence of biliary obstruction or inflammation. Pancreas: Unremarkable. Spleen: Unremarkable. Adrenals/Urinary Tract: Negative adrenals. 3.2 cm upper pole cyst on the right. Symmetric normal renal enhancement otherwise. No stone or hydronephrosis. Unremarkable bladder. Stomach/Bowel: No obstruction. No appendicitis. Left colonic diverticulosis. Vascular/Lymphatic: No acute vascular abnormality. Atherosclerotic calcification. No mass or  adenopathy. Reproductive:Prostate calcification. Other: No ascites or pneumoperitoneum. Musculoskeletal: No acute abnormalities. L4-5 PLIF with solid arthrodesis. IMPRESSION: 1. No acute finding. 2. Hepatic steatosis, cholelithiasis, and colonic diverticulosis. Electronically Signed   By: Monte Fantasia M.D.   On: 01/19/2018 19:18    Micro Results    Recent Results (from the past 240 hour(s))  MRSA PCR Screening     Status: None   Collection Time: 01/20/18  6:07 PM  Result Value Ref Range Status   MRSA by PCR NEGATIVE NEGATIVE Final    Comment:        The GeneXpert MRSA Assay (FDA approved for NASAL specimens only), is one component of a comprehensive MRSA colonization surveillance program. It is not intended to diagnose MRSA infection nor to guide or monitor treatment for MRSA infections. Performed at [facility], [place] [address], [place], [place] [address]        Today   Subjective:   Jamauri Kruzel today has no headache, he has been having some musculoskeletal chest pain, worse dyspnea at baseline.  Objective:   Blood pressure (!) 143/90, pulse 71, temperature (!) 97.4 F (36.3 C), temperature source Oral, resp. rate 19,  height 5\' 11"  (1.803 m), weight (!) 181.9 kg, SpO2 96 %.   Intake/Output Summary (Last 24 hours) at 01/23/2018 1714 Last data filed at 01/23/2018 1200 Gross per 24 hour  Intake 6484.9 ml  Output 300 ml  Net 6184.9 ml    Exam Awake Alert, Oriented x 3, No new F.N deficits, Normal affect Symmetrical Chest wall movement, Good air movement bilaterally, CTAB RRR,No Gallops,Rubs or new Murmurs, No Parasternal Heave +ve B.Sounds, Abd Soft, Non tender, , No rebound -guarding or rigidity. No Cyanosis, Clubbing or edema, No new Rash or bruise  Data Review   CBC w Diff:  Lab Results  Component Value Date   WBC 7.7 01/23/2018   HGB 11.4 (L) 01/23/2018   HGB 13.9 07/31/2017   HCT 35.1 (L) 01/23/2018   HCT 41.5 07/31/2017   PLT 197 01/23/2018   PLT 274 07/31/2017   LYMPHOPCT 9 01/19/2018   MONOPCT 8 01/19/2018   EOSPCT 1 01/19/2018   BASOPCT 1 01/19/2018    CMP:  Lab Results  Component Value Date   NA 139 01/23/2018   NA 138 07/31/2017   K 4.3 01/23/2018   CL 111 01/23/2018   CO2 23 01/23/2018   BUN 18 01/23/2018   BUN 28 (H) 07/31/2017   CREATININE 1.52 (H) 01/23/2018   PROT 7.5 01/19/2018   PROT 6.9 07/31/2017   ALBUMIN 3.7 01/19/2018   ALBUMIN 4.0 07/31/2017   BILITOT 0.8 01/19/2018   BILITOT 0.4 07/31/2017   ALKPHOS 55 01/19/2018   AST 30 01/19/2018   ALT 29 01/19/2018  .   Total Time in preparing paper work, data evaluation and todays exam - 77 minutes  Phillips Climes M.D on 01/23/2018 at [place]  [phone]

## 2018-01-23 NOTE — Progress Notes (Signed)
Removed PIV access and pt received discharge instructions with prescriptions and pt's son understood well.

## 2018-01-26 DIAGNOSIS — I639 Cerebral infarction, unspecified: Secondary | ICD-10-CM

## 2018-01-26 HISTORY — DX: Cerebral infarction, unspecified: I63.9

## 2018-02-01 ENCOUNTER — Ambulatory Visit: Payer: Medicaid Other | Admitting: Physician Assistant

## 2018-02-01 ENCOUNTER — Encounter: Payer: Self-pay | Admitting: Physician Assistant

## 2018-02-01 VITALS — BP 152/92 | HR 66 | Temp 97.4°F | Ht 71.0 in | Wt >= 6400 oz

## 2018-02-01 DIAGNOSIS — G4730 Sleep apnea, unspecified: Secondary | ICD-10-CM | POA: Diagnosis not present

## 2018-02-01 DIAGNOSIS — M67912 Unspecified disorder of synovium and tendon, left shoulder: Secondary | ICD-10-CM

## 2018-02-01 DIAGNOSIS — I252 Old myocardial infarction: Secondary | ICD-10-CM | POA: Diagnosis not present

## 2018-02-01 DIAGNOSIS — E876 Hypokalemia: Secondary | ICD-10-CM | POA: Diagnosis not present

## 2018-02-01 DIAGNOSIS — Z23 Encounter for immunization: Secondary | ICD-10-CM

## 2018-02-01 DIAGNOSIS — M5136 Other intervertebral disc degeneration, lumbar region: Secondary | ICD-10-CM

## 2018-02-01 MED ORDER — OXYCODONE-ACETAMINOPHEN 10-325 MG PO TABS: 1.0000 | ORAL_TABLET | Freq: Three times a day (TID) | ORAL | 0 refills | Status: DC | PRN

## 2018-02-02 ENCOUNTER — Ambulatory Visit (INDEPENDENT_AMBULATORY_CARE_PROVIDER_SITE_OTHER): Payer: Medicaid Other | Admitting: Internal Medicine

## 2018-02-02 ENCOUNTER — Encounter: Payer: Self-pay | Admitting: Internal Medicine

## 2018-02-02 VITALS — BP 153/90 | HR 84 | Temp 96.7°F | Ht 71.0 in | Wt >= 6400 oz

## 2018-02-02 DIAGNOSIS — K5730 Diverticulosis of large intestine without perforation or abscess without bleeding: Secondary | ICD-10-CM | POA: Diagnosis not present

## 2018-02-02 DIAGNOSIS — Z86010 Personal history of colonic polyps: Secondary | ICD-10-CM | POA: Diagnosis not present

## 2018-02-02 LAB — CMP14+EGFR
A/G RATIO: 1.4 (ref 1.2–2.2)
ALT: 34 IU/L (ref 0–44)
AST: 21 IU/L (ref 0–40)
Albumin: 4.2 g/dL (ref 3.5–5.5)
Alkaline Phosphatase: 63 IU/L (ref 39–117)
BUN/Creatinine Ratio: 11 (ref 9–20)
BUN: 18 mg/dL (ref 6–24)
Bilirubin Total: 0.6 mg/dL (ref 0.0–1.2)
CALCIUM: 9.5 mg/dL (ref 8.7–10.2)
CHLORIDE: 98 mmol/L (ref 96–106)
CO2: 23 mmol/L (ref 20–29)
CREATININE: 1.6 mg/dL — AB (ref 0.76–1.27)
GFR, EST AFRICAN AMERICAN: 55 mL/min/{1.73_m2} — AB (ref >59)
GFR, EST NON AFRICAN AMERICAN: 47 mL/min/{1.73_m2} — AB (ref >59)
Globulin, Total: 3 g/dL (ref 1.5–4.5)
Glucose: 125 mg/dL — ABNORMAL HIGH (ref 65–99)
POTASSIUM: 4.1 mmol/L (ref 3.5–5.2)
Sodium: 138 mmol/L (ref 134–144)
Total Protein: 7.2 g/dL (ref 6.0–8.5)

## 2018-02-02 LAB — LIPID PANEL
CHOL/HDL RATIO: 3.8 ratio (ref 0.0–5.0)
Cholesterol, Total: 134 mg/dL (ref 100–199)
HDL: 35 mg/dL — ABNORMAL LOW (ref >39)
LDL Calculated: 79 mg/dL (ref 0–99)
Triglycerides: 99 mg/dL (ref 0–149)
VLDL CHOLESTEROL CAL: 20 mg/dL (ref 5–40)

## 2018-02-02 NOTE — Progress Notes (Signed)
Primary Care Physician:  Terald Sleeper, PA-C Primary Gastroenterologist:  Dr. [name]  Pre-Procedure History & Physical: HPI:  Bradley Torres is a 57 y.o. male here for consideration of a follow-up colonoscopy.  Patient had multiple colonic polyps removed from his colon earlier this year in the setting of a poor prep.  It was deemed to be an inadequate exam for complete clearance of his colon.  He put off returning in 2 months for follow-up colonoscopy.  In the interim, last month, he developed left lower quadrant abdominal pain-treated empirically for diverticulitis which improved (abdominal CT last month demonstrated diverticulosis but no diverticulitis;  Fatty liver with layering gallstones).  In addition, he developed chest pain and ended up being admitted to the hospital with a non-ST segment MI.  Demand ischemia was felt to be playing a major role.  Cardiac catheterization demonstrated no significant coronary disease.  He is not anticoagulated.  He is to be worked up for sleep apnea (which he likely has).  No further abdominal pain;  no normal bowel function.  He is not taking a fiber supplement.  He has no upper GI tract symptoms.  In addition, to a personal history of colonic polyps, his mother had colon cancer.  Past Medical History:  Diagnosis Date  . Chronic back pain   . Essential hypertension   . Morbid obesity ([place])   . Panic disorder   . Prediabetes   . Shoulder pain, left    Following fall    Past Surgical History:  Procedure Laterality Date  . Ankle fracture sugery Left   . COLONOSCOPY WITH PROPOFOL N/A 05/29/2017   Procedure: COLONOSCOPY WITH PROPOFOL;  Surgeon: Daneil Dolin, MD;  [facility];  Service: Endoscopy;  Laterality: N/A;  2:00pm  . LEFT HEART CATH AND CORONARY ANGIOGRAPHY N/A 01/23/2018   Procedure: LEFT HEART CATH AND CORONARY ANGIOGRAPHY;  Surgeon: Troy Sine, MD;  Location: [facility];  Service: Cardiovascular;  Laterality: N/A;    . LUMBAR SPINE SURGERY     fusion  . POLYPECTOMY  05/29/2017   Procedure: POLYPECTOMY;  Surgeon: Daneil Dolin, MD;  Location: [facility];  Service: Endoscopy;;  ascending colon polyp x5-cs transverse colon polyp x4- cs descending colon polyp x1- hs sigmoid colon polyp x1 -hs rectal polyp x1- hs  . TONSILLECTOMY      Prior to Admission medications   Medication Sig Start Date End Date Taking? Authorizing Provider  bisoprolol-hydrochlorothiazide (ZIAC) 10-6.25 MG tablet Take 1 tablet by mouth daily. 05/01/17  Yes Terald Sleeper, PA-C  cyclobenzaprine (FLEXERIL) 10 MG tablet TAKE 1 TABLET BY MOUTH THREE TIMES DAILY AS NEEDED Patient taking differently: TAKE 1 TABLET BY MOUTH THREE TIMES DAILY AS NEEDED FOR BACK SPASMS. 01/03/17  Yes Terald Sleeper, PA-C  lisinopril-hydrochlorothiazide (PRINZIDE,ZESTORETIC) 20-25 MG tablet Take 1 tablet by mouth daily. 07/31/17  Yes Terald Sleeper, PA-C  oxyCODONE-acetaminophen (PERCOCET) 10-325 MG tablet Take 1 tablet by mouth every 8 (eight) hours as needed for pain. 02/01/18  Yes Terald Sleeper, PA-C  oxyCODONE-acetaminophen (PERCOCET) 10-325 MG tablet Take 1 tablet by mouth every 8 (eight) hours as needed for pain. 02/01/18  Yes Terald Sleeper, PA-C  oxyCODONE-acetaminophen (PERCOCET) 10-325 MG tablet Take 1 tablet by mouth every 8 (eight) hours as needed for pain. 02/01/18  Yes Terald Sleeper, PA-C  terbinafine (LAMISIL) 1 % cream Apply 1 application topically 2 (two) times daily as needed (FOR ITCHY SKIN.).  08/27/17  Yes Terald Sleeper, PA-C  atorvastatin (LIPITOR) 20 MG tablet Take 1 tablet (20 mg total) by mouth daily. Patient not taking: Reported on 02/02/2018 01/23/18 01/23/19  Elgergawy, Silver Huguenin, MD    Allergies as of 02/02/2018 - Review Complete 02/02/2018  Allergen Reaction Noted  . Ibuprofen Other (See Comments) 11/20/2013  . Diflucan [fluconazole] Rash 01/19/2018  . Losartan Rash 03/17/2016    Family History  Problem Relation Age of Onset  .  Colon cancer Mother        died at age 49  . Heart attack Father   . Hepatitis Sister 42       Autoimmune  . Heart attack Paternal Grandfather     Social History   Socioeconomic History  . Marital status: Single    Spouse name: Not on file  . Number of children: Not on file  . Years of education: Not on file  . Highest education level: Not on file  Occupational History  . Not on file  Social Needs  . Financial resource strain: Not on file  . Food insecurity:    Worry: Not on file    Inability: Not on file  . Transportation needs:    Medical: Not on file    Non-medical: Not on file  Tobacco Use  . Smoking status: Never Smoker  . Smokeless tobacco: Never Used  Substance and Sexual Activity  . Alcohol use: No  . Drug use: Yes    Types: Marijuana    Comment: occassional  . Sexual activity: Not Currently    Birth control/protection: None  Lifestyle  . Physical activity:    Days per week: Not on file    Minutes per session: Not on file  . Stress: Not on file  Relationships  . Social connections:    Talks on phone: Not on file    Gets together: Not on file    Attends religious service: Not on file    Active member of club or organization: Not on file    Attends meetings of clubs or organizations: Not on file    Relationship status: Not on file  . Intimate partner violence:    Fear of current or ex partner: Not on file    Emotionally abused: Not on file    Physically abused: Not on file    Forced sexual activity: Not on file  Other Topics Concern  . Not on file  Social History Narrative  . Not on file    Review of Systems: See HPI, otherwise negative ROS  Physical Exam: BP (!) 153/90   Pulse 84   Temp (!) 96.7 F (35.9 C) (Oral)   Ht 5\' 11"  (1.803 m)   Wt (!) 402 lb (182.3 kg)   BMI 56.07 kg/m  General:   Alert,  pleasant and cooperative in NAD.  Morbidly obese. Lungs:  Clear throughout to auscultation.   No wheezes, crackles, or rhonchi. No acute  distress. Heart:  Regular rate and rhythm; no murmurs, clicks, rubs,  or gallops. Abdomen: Rotund.  Non-distended, normal bowel sounds.  Soft and nontender without appreciable mass or hepatosplenomegaly.  Pulses:  Normal pulses noted. Extremities:  Without clubbing or edema.  Impression/Plan: 57 year old morbidly obese male - status post recent NSTEMI.  History of multiple colonic polyps removed from his colon earlier this year.  He certainly needs a follow-up colonoscopy but this needs to be put off for the time being.  I discussed with him the importance of  being evaluated and being treated as appropriate for sleep apnea.  We discussed weight loss and the concept of bariatric surgery.  Patient states he is not interested in bariatric surgery; states he has always been overweight and will likely stay that way.  Episode of left lower quadrant abdominal pain last month -  may well have been related to a bout of diverticulitis.  Those symptoms have resolved.  CT scan of the abdomen somewhat reassuring.  Recommendations:   Follow-through on a sleep study  Take benefiber one tablespoon twice daily  Office visit in 4 months to re-assess; plan for repeat colonoscopy at that time.        Notice: This dictation was prepared with Dragon dictation along with smaller phrase technology. Any transcriptional errors that result from this process are unintentional and may not be corrected upon review.

## 2018-02-02 NOTE — Patient Instructions (Addendum)
Follow-through on a sleep study  Take benefiber one tablespoon twice daily  Office visit in 4 months to re-assess; plan for repeat colonoscopy at that time.   Thank you for entrusting me with your care. I appreciate the opportunity to create valuable relationships with patients and family members. You may receive a questionnaire in the mail regarding your visit here today.  It would be appreciated if you would take the time to return it.  If you were not completely satisfied with your experience, I would love to discuss any concerns with you.   Bridgette Habermann, M.D. [facility]

## 2018-02-05 NOTE — Progress Notes (Signed)
BP (!) 152/92   Pulse 66   Temp (!) 97.4 F (36.3 C) (Oral)   Ht 5' 11"  (1.803 m)   Wt (!) 405 lb (183.7 kg)   BMI 56.49 kg/m    Subjective:    Patient ID: Bradley Torres, male    DOB: [birth year], 57 y.o.   MRN: [id number]  HPI: Bradley Torres is a 57 y.o. male presenting on 02/01/2018 for Hypertension (3 month follow up)  Patient recently had a cardiovascular event where he was admitted and found to have a non-STEMI.  In the work-up that he had in the hospital there was mention of getting a sleep study.  However no one has called him to set this up.  He is under Dr. Evette Georges cardiovascular care.  I told him that we will go ahead and get the sleep study set up because I do think this is a very good idea for his multiple medical problems.  He also has still been seeing his gastroenterologist.  All his medications are reviewed today and labs will be updated.  He has no other complaints at this time.  Past Medical History:  Diagnosis Date  . Chronic back pain   . Essential hypertension   . Morbid obesity ([place])   . Panic disorder   . Prediabetes   . Shoulder pain, left    Following fall   Relevant past medical, surgical, family and social history reviewed and updated as indicated. Interim medical history since our last visit reviewed. Allergies and medications reviewed and updated. DATA REVIEWED: CHART IN EPIC  Family History reviewed for pertinent findings.  Review of Systems  Constitutional: Positive for diaphoresis and fatigue. Negative for appetite change.  Eyes: Negative for pain and visual disturbance.  Respiratory: Negative.  Negative for cough, chest tightness, shortness of breath and wheezing.   Cardiovascular: Positive for chest pain. Negative for palpitations and leg swelling.  Gastrointestinal: Negative.  Negative for abdominal pain, diarrhea, nausea and vomiting.  Genitourinary: Negative.   Musculoskeletal: Positive for arthralgias, back pain, gait problem and  myalgias.  Skin: Negative.  Negative for color change and rash.  Neurological: Negative for weakness, numbness and headaches.  Psychiatric/Behavioral: Negative.     Allergies as of 02/01/2018      Reactions   Ibuprofen Other (See Comments)   Rectal bleed   Diflucan [fluconazole] Rash   Losartan Rash      Medication List        Accurate as of 02/01/18 11:59 PM. Always use your most recent med list.          atorvastatin 20 MG tablet Commonly known as:  LIPITOR Take 1 tablet (20 mg total) by mouth daily.   bisoprolol-hydrochlorothiazide 10-6.25 MG tablet Commonly known as:  ZIAC Take 1 tablet by mouth daily.   cyclobenzaprine 10 MG tablet Commonly known as:  FLEXERIL TAKE 1 TABLET BY MOUTH THREE TIMES DAILY AS NEEDED   lisinopril-hydrochlorothiazide 20-25 MG tablet Commonly known as:  PRINZIDE,ZESTORETIC Take 1 tablet by mouth daily.   oxyCODONE-acetaminophen 10-325 MG tablet Commonly known as:  PERCOCET Take 1 tablet by mouth every 8 (eight) hours as needed for pain.   oxyCODONE-acetaminophen 10-325 MG tablet Commonly known as:  PERCOCET Take 1 tablet by mouth every 8 (eight) hours as needed for pain.   oxyCODONE-acetaminophen 10-325 MG tablet Commonly known as:  PERCOCET Take 1 tablet by mouth every 8 (eight) hours as needed for pain.   terbinafine 1 % cream  Commonly known as:  LAMISIL Apply 1 application topically 2 (two) times daily as needed (FOR ITCHY SKIN.).          Objective:    BP (!) 152/92   Pulse 66   Temp (!) 97.4 F (36.3 C) (Oral)   Ht 5' 11"  (1.803 m)   Wt (!) 405 lb (183.7 kg)   BMI 56.49 kg/m   Allergies  Allergen Reactions  . Ibuprofen Other (See Comments)    Rectal bleed  . Diflucan [Fluconazole] Rash  . Losartan Rash    Wt Readings from Last 3 Encounters:  02/02/18 (!) 402 lb (182.3 kg)  02/01/18 (!) 405 lb (183.7 kg)  01/20/18 (!) 401 lb (181.9 kg)    Physical Exam  Constitutional: He appears well-developed and  well-nourished. No distress.  HENT:  Head: Normocephalic and atraumatic.  Eyes: Pupils are equal, round, and reactive to light. Conjunctivae and EOM are normal.  Neck: Normal range of motion. Neck supple.  Cardiovascular: Normal rate, regular rhythm and normal heart sounds.  Pulmonary/Chest: Effort normal and breath sounds normal.  Abdominal: Soft. Bowel sounds are normal.  Musculoskeletal: Normal range of motion.  Skin: Skin is warm and dry. He is not diaphoretic.    Results for orders placed or performed in visit on 02/01/18  CMP14+EGFR  Result Value Ref Range   Glucose 125 (H) 65 - 99 mg/dL   BUN 18 6 - 24 mg/dL   Creatinine, Ser 1.60 (H) 0.76 - 1.27 mg/dL   GFR calc non Af Amer 47 (L) >59 mL/min/1.73   GFR calc Af Amer 55 (L) >59 mL/min/1.73   BUN/Creatinine Ratio 11 9 - 20   Sodium 138 134 - 144 mmol/L   Potassium 4.1 3.5 - 5.2 mmol/L   Chloride 98 96 - 106 mmol/L   CO2 23 20 - 29 mmol/L   Calcium 9.5 8.7 - 10.2 mg/dL   Total Protein 7.2 6.0 - 8.5 g/dL   Albumin 4.2 3.5 - 5.5 g/dL   Globulin, Total 3.0 1.5 - 4.5 g/dL   Albumin/Globulin Ratio 1.4 1.2 - 2.2   Bilirubin Total 0.6 0.0 - 1.2 mg/dL   Alkaline Phosphatase 63 39 - 117 IU/L   AST 21 0 - 40 IU/L   ALT 34 0 - 44 IU/L  Lipid panel  Result Value Ref Range   Cholesterol, Total 134 100 - 199 mg/dL   Triglycerides 99 0 - 149 mg/dL   HDL 35 (L) >39 mg/dL   VLDL Cholesterol Cal 20 5 - 40 mg/dL   LDL Calculated 79 0 - 99 mg/dL   Chol/HDL Ratio 3.8 0.0 - 5.0 ratio      Assessment & Plan:   1. DDD (degenerative disc disease), lumbar - oxyCODONE-acetaminophen (PERCOCET) 10-325 MG tablet; Take 1 tablet by mouth every 8 (eight) hours as needed for pain.  Dispense: 90 tablet; Refill: 0  2. Disorder of left rotator cuff - oxyCODONE-acetaminophen (PERCOCET) 10-325 MG tablet; Take 1 tablet by mouth every 8 (eight) hours as needed for pain.  Dispense: 90 tablet; Refill: 0  3. Hypokalemia - CMP14+EGFR - Lipid panel  4.  H/O non-ST elevation myocardial infarction (NSTEMI) - CMP14+EGFR - Lipid panel  5. Need for immunization against influenza - Flu Vaccine QUAD 36+ mos IM  6. Sleep apnea, unspecified type - Ambulatory referral to Sleep Studies   Continue all other maintenance medications as listed above.  Follow up plan: Return in about 3 months (around 05/04/2018).  Educational handout given  for [place] PA-C [facility] [address]  [place], [place] [address] [phone]   02/05/2018, 5:32 PM

## 2018-02-27 ENCOUNTER — Other Ambulatory Visit: Payer: Self-pay

## 2018-02-27 ENCOUNTER — Ambulatory Visit (HOSPITAL_COMMUNITY)
Admission: RE | Admit: 2018-02-27 | Discharge: 2018-02-27 | Disposition: A | Discharge status: Home / Self Care | Payer: Medicaid Other | Source: Ambulatory Visit | Attending: Family | Admitting: Family

## 2018-02-27 ENCOUNTER — Emergency Department (HOSPITAL_COMMUNITY): Payer: Medicaid Other

## 2018-02-27 ENCOUNTER — Ambulatory Visit: Payer: Medicaid Other | Admitting: Family

## 2018-02-27 ENCOUNTER — Encounter: Payer: Self-pay | Admitting: Family

## 2018-02-27 ENCOUNTER — Encounter (HOSPITAL_COMMUNITY): Payer: Self-pay

## 2018-02-27 ENCOUNTER — Inpatient Hospital Stay (HOSPITAL_COMMUNITY)
Admission: EM | Admit: 2018-02-27 | Discharge: 2018-03-02 | DRG: 176 | Disposition: A | Discharge status: Home / Self Care | Payer: Medicaid Other | Attending: Internal Medicine | Admitting: Internal Medicine

## 2018-02-27 VITALS — BP 109/75 | HR 75 | Temp 96.5°F | Ht 71.0 in | Wt 384.4 lb

## 2018-02-27 DIAGNOSIS — N183 Chronic kidney disease, stage 3 (moderate): Secondary | ICD-10-CM | POA: Diagnosis present

## 2018-02-27 DIAGNOSIS — I2782 Chronic pulmonary embolism: Secondary | ICD-10-CM | POA: Diagnosis present

## 2018-02-27 DIAGNOSIS — M549 Dorsalgia, unspecified: Secondary | ICD-10-CM | POA: Diagnosis present

## 2018-02-27 DIAGNOSIS — Z886 Allergy status to analgesic agent status: Secondary | ICD-10-CM | POA: Diagnosis not present

## 2018-02-27 DIAGNOSIS — I2692 Saddle embolus of pulmonary artery without acute cor pulmonale: Principal | ICD-10-CM | POA: Diagnosis present

## 2018-02-27 DIAGNOSIS — Z6841 Body Mass Index (BMI) 40.0 and over, adult: Secondary | ICD-10-CM

## 2018-02-27 DIAGNOSIS — Z8249 Family history of ischemic heart disease and other diseases of the circulatory system: Secondary | ICD-10-CM

## 2018-02-27 DIAGNOSIS — I130 Hypertensive heart and chronic kidney disease with heart failure and stage 1 through stage 4 chronic kidney disease, or unspecified chronic kidney disease: Secondary | ICD-10-CM | POA: Diagnosis present

## 2018-02-27 DIAGNOSIS — I82431 Acute embolism and thrombosis of right popliteal vein: Secondary | ICD-10-CM

## 2018-02-27 DIAGNOSIS — I10 Essential (primary) hypertension: Secondary | ICD-10-CM

## 2018-02-27 DIAGNOSIS — Z7982 Long term (current) use of aspirin: Secondary | ICD-10-CM | POA: Diagnosis not present

## 2018-02-27 DIAGNOSIS — I2699 Other pulmonary embolism without acute cor pulmonale: Secondary | ICD-10-CM | POA: Diagnosis not present

## 2018-02-27 DIAGNOSIS — R0602 Shortness of breath: Secondary | ICD-10-CM

## 2018-02-27 DIAGNOSIS — R7303 Prediabetes: Secondary | ICD-10-CM | POA: Diagnosis present

## 2018-02-27 DIAGNOSIS — I361 Nonrheumatic tricuspid (valve) insufficiency: Secondary | ICD-10-CM | POA: Diagnosis not present

## 2018-02-27 DIAGNOSIS — Z888 Allergy status to other drugs, medicaments and biological substances status: Secondary | ICD-10-CM

## 2018-02-27 DIAGNOSIS — G4733 Obstructive sleep apnea (adult) (pediatric): Secondary | ICD-10-CM | POA: Diagnosis present

## 2018-02-27 DIAGNOSIS — I2602 Saddle embolus of pulmonary artery with acute cor pulmonale: Secondary | ICD-10-CM | POA: Diagnosis present

## 2018-02-27 DIAGNOSIS — I5032 Chronic diastolic (congestive) heart failure: Secondary | ICD-10-CM

## 2018-02-27 DIAGNOSIS — I82401 Acute embolism and thrombosis of unspecified deep veins of right lower extremity: Secondary | ICD-10-CM | POA: Diagnosis present

## 2018-02-27 DIAGNOSIS — Z86010 Personal history of colonic polyps: Secondary | ICD-10-CM

## 2018-02-27 DIAGNOSIS — Z79899 Other long term (current) drug therapy: Secondary | ICD-10-CM | POA: Diagnosis not present

## 2018-02-27 DIAGNOSIS — E66813 Obesity, class 3: Secondary | ICD-10-CM

## 2018-02-27 DIAGNOSIS — M79661 Pain in right lower leg: Secondary | ICD-10-CM

## 2018-02-27 DIAGNOSIS — I252 Old myocardial infarction: Secondary | ICD-10-CM

## 2018-02-27 DIAGNOSIS — I152 Hypertension secondary to endocrine disorders: Secondary | ICD-10-CM

## 2018-02-27 DIAGNOSIS — Z981 Arthrodesis status: Secondary | ICD-10-CM

## 2018-02-27 DIAGNOSIS — I82411 Acute embolism and thrombosis of right femoral vein: Secondary | ICD-10-CM | POA: Insufficient documentation

## 2018-02-27 DIAGNOSIS — E1159 Type 2 diabetes mellitus with other circulatory complications: Secondary | ICD-10-CM

## 2018-02-27 DIAGNOSIS — G894 Chronic pain syndrome: Secondary | ICD-10-CM | POA: Diagnosis not present

## 2018-02-27 DIAGNOSIS — F410 Panic disorder [episodic paroxysmal anxiety] without agoraphobia: Secondary | ICD-10-CM | POA: Diagnosis present

## 2018-02-27 DIAGNOSIS — Z79891 Long term (current) use of opiate analgesic: Secondary | ICD-10-CM | POA: Diagnosis not present

## 2018-02-27 DIAGNOSIS — Z800 Family history of malignant neoplasm of digestive organs: Secondary | ICD-10-CM

## 2018-02-27 DIAGNOSIS — I272 Pulmonary hypertension, unspecified: Secondary | ICD-10-CM | POA: Diagnosis present

## 2018-02-27 DIAGNOSIS — E876 Hypokalemia: Secondary | ICD-10-CM | POA: Diagnosis present

## 2018-02-27 DIAGNOSIS — N182 Chronic kidney disease, stage 2 (mild): Secondary | ICD-10-CM | POA: Diagnosis not present

## 2018-02-27 HISTORY — DX: Acute embolism and thrombosis of unspecified deep veins of unspecified lower extremity: I82.409

## 2018-02-27 LAB — CBC WITH DIFFERENTIAL/PLATELET
Abs Immature Granulocytes: 0.07 10*3/uL (ref 0.00–0.07)
BASOS ABS: 0.1 10*3/uL (ref 0.0–0.1)
BASOS PCT: 1 %
EOS PCT: 1 %
Eosinophils Absolute: 0.1 10*3/uL (ref 0.0–0.5)
HCT: 44.2 % (ref 39.0–52.0)
HEMOGLOBIN: 14.3 g/dL (ref 13.0–17.0)
Immature Granulocytes: 1 %
LYMPHS PCT: 9 %
Lymphs Abs: 1 10*3/uL (ref 0.7–4.0)
MCH: 31 pg (ref 26.0–34.0)
MCHC: 32.4 g/dL (ref 30.0–36.0)
MCV: 95.9 fL (ref 80.0–100.0)
Monocytes Absolute: 0.9 10*3/uL (ref 0.1–1.0)
Monocytes Relative: 8 %
NRBC: 0 % (ref 0.0–0.2)
Neutro Abs: 9.1 10*3/uL — ABNORMAL HIGH (ref 1.7–7.7)
Neutrophils Relative %: 80 %
PLATELETS: 278 10*3/uL (ref 150–400)
RBC: 4.61 MIL/uL (ref 4.22–5.81)
RDW: 12.8 % (ref 11.5–15.5)
WBC: 11.2 10*3/uL — AB (ref 4.0–10.5)

## 2018-02-27 LAB — BASIC METABOLIC PANEL
Anion gap: 11 (ref 5–15)
BUN: 26 mg/dL — ABNORMAL HIGH (ref 6–20)
CHLORIDE: 101 mmol/L (ref 98–111)
CO2: 26 mmol/L (ref 22–32)
Calcium: 9 mg/dL (ref 8.9–10.3)
Creatinine, Ser: 1.86 mg/dL — ABNORMAL HIGH (ref 0.61–1.24)
GFR, EST AFRICAN AMERICAN: 46 mL/min — AB (ref >60)
GFR, EST NON AFRICAN AMERICAN: 40 mL/min — AB (ref >60)
Glucose, Bld: 135 mg/dL — ABNORMAL HIGH (ref 70–99)
POTASSIUM: 3.1 mmol/L — AB (ref 3.5–5.1)
SODIUM: 138 mmol/L (ref 135–145)

## 2018-02-27 LAB — BRAIN NATRIURETIC PEPTIDE: B Natriuretic Peptide: 309 pg/mL — ABNORMAL HIGH (ref 0.0–100.0)

## 2018-02-27 LAB — MAGNESIUM: Magnesium: 2.1 mg/dL (ref 1.7–2.4)

## 2018-02-27 LAB — TROPONIN I: TROPONIN I: 0.19 ng/mL — AB (ref <0.03)

## 2018-02-27 LAB — APTT: aPTT: 29 seconds (ref 24–36)

## 2018-02-27 MED ORDER — POTASSIUM CHLORIDE IN NACL 40-0.9 MEQ/L-% IV SOLN
INTRAVENOUS | Status: AC
  Administered 2018-02-27: 75 mL/h via INTRAVENOUS

## 2018-02-27 MED ORDER — ONDANSETRON HCL 4 MG PO TABS: 4.0000 mg | ORAL_TABLET | Freq: Four times a day (QID) | ORAL | Status: DC | PRN

## 2018-02-27 MED ORDER — POTASSIUM CHLORIDE IN NACL 40-0.9 MEQ/L-% IV SOLN: INTRAVENOUS | Status: DC

## 2018-02-27 MED ORDER — ACETAMINOPHEN 650 MG RE SUPP: 650.0000 mg | Freq: Four times a day (QID) | RECTAL | Status: DC | PRN

## 2018-02-27 MED ORDER — POLYETHYLENE GLYCOL 3350 17 G PO PACK: 17.0000 g | PACK | Freq: Every day | ORAL | Status: DC | PRN

## 2018-02-27 MED ORDER — POTASSIUM CHLORIDE CRYS ER 20 MEQ PO TBCR
40.0000 meq | EXTENDED_RELEASE_TABLET | Freq: Once | ORAL | Status: AC
  Administered 2018-02-27: 40 meq via ORAL
  Filled 2018-02-27: qty 2

## 2018-02-27 MED ORDER — ONDANSETRON HCL 4 MG/2ML IJ SOLN: 4.0000 mg | Freq: Four times a day (QID) | INTRAMUSCULAR | Status: DC | PRN

## 2018-02-27 MED ORDER — HEPARIN BOLUS VIA INFUSION
4000.0000 [IU] | Freq: Once | INTRAVENOUS | Status: AC
  Administered 2018-02-27: 4000 [IU] via INTRAVENOUS

## 2018-02-27 MED ORDER — ACETAMINOPHEN 325 MG PO TABS: 650.0000 mg | ORAL_TABLET | Freq: Four times a day (QID) | ORAL | Status: DC | PRN

## 2018-02-27 MED ORDER — HEPARIN (PORCINE) 25000 UT/250ML-% IV SOLN
2000.0000 [IU]/h | INTRAVENOUS | Status: DC
  Administered 2018-02-27: 2200 [IU]/h via INTRAVENOUS
  Administered 2018-02-28 – 2018-03-01 (×4): 2000 [IU]/h via INTRAVENOUS
  Filled 2018-02-27 (×6): qty 250

## 2018-02-27 MED ORDER — IOPAMIDOL (ISOVUE-370) INJECTION 76%
100.0000 mL | Freq: Once | INTRAVENOUS | Status: AC | PRN
  Administered 2018-02-27: 80 mL via INTRAVENOUS

## 2018-02-27 MED ORDER — SODIUM CHLORIDE 0.9 % IV BOLUS: 500.0000 mL | Freq: Once | INTRAVENOUS | Status: DC

## 2018-02-27 NOTE — Patient Instructions (Signed)
Shortness of Breath, Adult  Shortness of breath means you have trouble breathing. Your lungs are organs for breathing.  Follow these instructions at home:  Pay attention to any changes in your symptoms. Take these actions to help with your condition:  ? Do not smoke. Smoking can cause shortness of breath. If you need help to quit smoking, ask your doctor.  ? Avoid things that can make it harder to breathe, such as:  ? Mold.  ? Dust.  ? Air pollution.  ? Chemical smells.  ? Things that can cause allergy symptoms (allergens), if you have allergies.  ? Keep your living space clean and free of mold and dust.  ? Rest as needed. Slowly return to your usual activities.  ? Take over-the-counter and prescription medicines, including oxygen and inhaled medicines, only as told by your doctor.  ? Keep all follow-up visits as told by your doctor. This is important.  Contact a doctor if:  ? Your condition does not get better as soon as expected.  ? You have a hard time doing your normal activities, even after you rest.  ? You have new symptoms.  Get help right away if:  ? You have trouble breathing when you are resting.  ? You feel light-headed or you faint.  ? You have a cough that is not helped by medicines.  ? You cough up blood.  ? You have pain with breathing.  ? You have pain in your chest, arms, shoulders, or belly (abdomen).  ? You have a fever.  ? You cannot walk up stairs.  ? You cannot exercise the way you normally do.  This information is not intended to replace advice given to you by your health care provider. Make sure you discuss any questions you have with your health care provider.  Document Released: 08/31/2007 Document Revised: 03/31/2016 Document Reviewed: 03/31/2016  Elsevier Interactive Patient Education ? 2017 [employer]

## 2018-02-27 NOTE — ED Triage Notes (Signed)
Pt sent by Dr Lenna Gilford after having +US for DVT in Right leg. Pt with pain in right leg for 1 week.

## 2018-02-27 NOTE — H&P (Signed)
History and Physical    Bradley Torres CVE:[id number] DOB: Feb 10, 1961 DOA: 02/27/2018  PCP: Terald Sleeper, PA-C   Patient coming from: Home  Chief Complaint: SOB, Chest pain  HPI: Bradley Torres is a 57 y.o. male with medical history significant for morbid obesity BMI 53, HTN, Pre DM, NSTEMI, CKD3, who was sent to the ED by primary care provider.  Patient complained of right lower extremity pain and swelling for about a week which was actually proving, but lower extremity Dopplers were done and were positive for DVT.  Patient reports shortness of breath over the past month since discharge from the hospital, worse with exertion and left-sided chest pain over the past 4 days.  Also associated with chest pain was right arm numbness without weakness that lasted 2 days and resolved. No recent travels within the past 3 months, last trip was 4-hour drive to [place] in June.  No recent surgeries.  Family history of blood clots in maternal grandmother.  Recent admission 01/23/2018-with elevated troponin, transfered to [facility], cardiac cath- no significant coronary artery disease with essentially normal coronary arteries, and only mild luminal irregularity of the proximal LAD of 10%.    ED Course: Mild tachypnea to 22.  O2 sats greater than 92% on room air.  Heart rate 60s to 70s.  K low 3.1.  Creatinine 1.8, recent cr baseline 1.4-1.6, troponin elevated at 0.19, BNP 309.  CTA chest showed right heart strain consistent with at least submassive PE.  Critical care consulted recommended admission here at [facility] as patient is not requiring oxygen.  Patient was started on IV heparin  Review of Systems: As per HPI other systems reviewed are negative.  Past Medical History:  Diagnosis Date  . Chronic back pain   . DVT (deep venous thrombosis) ([place])   . Essential hypertension   . Morbid obesity ([place])   . Panic disorder   . Prediabetes   . Shoulder pain, left    Following fall    Past Surgical  History:  Procedure Laterality Date  . Ankle fracture sugery Left   . COLONOSCOPY WITH PROPOFOL N/A 05/29/2017   Procedure: COLONOSCOPY WITH PROPOFOL;  Surgeon: Daneil Dolin, MD;  Location: [facility];  Service: Endoscopy;  Laterality: N/A;  2:00pm  . LEFT HEART CATH AND CORONARY ANGIOGRAPHY N/A 01/23/2018   Procedure: LEFT HEART CATH AND CORONARY ANGIOGRAPHY;  Surgeon: Troy Sine, MD;  Location: [facility];  Service: Cardiovascular;  Laterality: N/A;  . LUMBAR SPINE SURGERY     fusion  . POLYPECTOMY  05/29/2017   Procedure: POLYPECTOMY;  Surgeon: Daneil Dolin, MD;  Location: [facility];  Service: Endoscopy;;  ascending colon polyp x5-cs transverse colon polyp x4- cs descending colon polyp x1- hs sigmoid colon polyp x1 -hs rectal polyp x1- hs  . TONSILLECTOMY       reports that he has never smoked. He has never used smokeless tobacco. He reports that he has current or past drug history. Drug: Marijuana. He reports that he does not drink alcohol.  Allergies  Allergen Reactions  . Ibuprofen Other (See Comments)    Rectal bleed  . Diflucan [Fluconazole] Rash  . Losartan Rash    Family History  Problem Relation Age of Onset  . Colon cancer Mother        died at age 92  . Heart attack Father   . Hepatitis Sister 42       Autoimmune  .  Heart attack Paternal Grandfather     Prior to Admission medications   Medication Sig Start Date End Date Taking? Authorizing Provider  aspirin EC 325 MG tablet Take 325 mg by mouth daily as needed (for blood thinning).   Yes [provider]  bisoprolol-hydrochlorothiazide (ZIAC) 10-6.25 MG tablet Take 1 tablet by mouth daily. 05/01/17  Yes Terald Sleeper, PA-C  cyclobenzaprine (FLEXERIL) 10 MG tablet TAKE 1 TABLET BY MOUTH THREE TIMES DAILY AS NEEDED Patient taking differently: Take 10 mg by mouth 3 (three) times daily as needed for muscle spasms.  01/03/17  Yes Terald Sleeper, PA-C  lisinopril-hydrochlorothiazide  (PRINZIDE,ZESTORETIC) 20-25 MG tablet Take 1 tablet by mouth daily. 07/31/17  Yes Terald Sleeper, PA-C  oxyCODONE-acetaminophen (PERCOCET) 10-325 MG tablet Take 1 tablet by mouth every 8 (eight) hours as needed for pain. 02/01/18  Yes Terald Sleeper, PA-C  terbinafine (LAMISIL) 1 % cream Apply 1 application topically 2 (two) times daily as needed (FOR ITCHY SKIN.). 08/27/17  Yes Terald Sleeper, PA-C  oxyCODONE-acetaminophen (PERCOCET) 10-325 MG tablet Take 1 tablet by mouth every 8 (eight) hours as needed for pain. 02/01/18   Terald Sleeper, PA-C  oxyCODONE-acetaminophen (PERCOCET) 10-325 MG tablet Take 1 tablet by mouth every 8 (eight) hours as needed for pain. 02/01/18   Terald Sleeper, PA-C    Physical Exam: Vitals:   02/27/18 1334 02/27/18 1800 02/27/18 1830 02/27/18 1900  BP:  126/78 127/87 118/82  Pulse:  66 65 70  Resp:  17 (!) 21 (!) 22  Temp:      TempSrc:      SpO2:  92% 95% 93%  Weight: (!) 173.7 kg     Height: 5\' 11"  (1.803 m)       Constitutional: NAD, calm, comfortable, obese Vitals:   02/27/18 1334 02/27/18 1800 02/27/18 1830 02/27/18 1900  BP:  126/78 127/87 118/82  Pulse:  66 65 70  Resp:  17 (!) 21 (!) 22  Temp:      TempSrc:      SpO2:  92% 95% 93%  Weight: (!) 173.7 kg     Height: 5\' 11"  (1.803 m)      Eyes: PERRL, lids and conjunctivae normal ENMT: Mucous membranes are moist. Posterior pharynx clear of any exudate or lesions.Normal dentition.  Neck: normal, supple, no masses, no thyromegaly Respiratory: clear to auscultation bilaterally, no wheezing, no crackles. Normal respiratory effort. No accessory muscle use.  Cardiovascular: Regular rate and rhythm, no murmurs / rubs / gallops.  Lower extremity pitting not appreciated, no erythema.  2+ pedal pulses.  Abdomen: obese abdomen, no masses palpated. No hepatosplenomegaly. Bowel sounds positive.  Musculoskeletal: no clubbing / cyanosis. No joint deformity upper and lower extremities. Good ROM, no contractures.  Normal muscle tone.  Skin: no rashes, lesions, ulcers. No induration Neurologic: CN 2-12 grossly intact. Sensation intact,  Strength 5/5 in all 4.  Psychiatric: Normal judgment and insight. Alert and oriented x 3. Normal mood.   Labs on Admission: I have personally reviewed following labs and imaging studies  CBC: Recent Labs  Lab 02/27/18 1440  WBC 11.2*  NEUTROABS 9.1*  HGB 14.3  HCT 44.2  MCV 95.9  PLT 324   Basic Metabolic Panel: Recent Labs  Lab 02/27/18 1440  NA 138  K 3.1*  CL 101  CO2 26  GLUCOSE 135*  BUN 26*  CREATININE 1.86*  CALCIUM 9.0   Cardiac Enzymes: Recent Labs  Lab 02/27/18 1440  TROPONINI 0.19*  Radiological Exams on Admission: Ct Angio Chest Pe W/cm &/or Wo Cm  Result Date: 02/27/2018 CLINICAL DATA:  Chest pain with right leg DVT. EXAM: CT ANGIOGRAPHY CHEST WITH CONTRAST TECHNIQUE: Multidetector CT imaging of the chest was performed using the standard protocol during bolus administration of intravenous contrast. Multiplanar CT image reconstructions and MIPs were obtained to evaluate the vascular anatomy. CONTRAST:  53mL ISOVUE-370 IOPAMIDOL (ISOVUE-370) INJECTION 76% COMPARISON:  09/09/2012 chest CT report, CXR 01/19/2018 FINDINGS: Cardiovascular: Saddle pulmonary embolus extending into the bilateral lobar and proximal segmental arteries are identified. RV/LV ratio equals 0.91 consistent with right heart strain. Normal heart size without pericardial effusion. Nonaneurysmal thoracic aorta without dissection or aneurysm. Unremarkable great vessels. Mediastinum/Nodes: No enlarged mediastinal, hilar, or axillary lymph nodes. Thyroid gland, trachea, and esophagus demonstrate no significant findings. Lungs/Pleura: Minimal upper lobe pulmonary blebs. No pulmonary consolidation, dominant mass, effusion or pneumothorax. Upper Abdomen: Partially included cyst off the upper pole of the right kidney measuring up to 2.1 cm. No adrenal mass. No acute upper abdominal  abnormality. Musculoskeletal: Degenerative disc disease midthoracic spine. No acute nor suspicious osseous abnormalities. Review of the MIP images confirms the above findings. IMPRESSION: Positive for acute PE with CT evidence of right heart strain (RV/LV Ratio = 0.91) consistent with at least submassive (intermediate risk) PE. The presence of right heart strain has been associated with an increased risk of morbidity and mortality. Please activate Code PE by paging [phone]. These results were called by telephone at the time of interpretation on 02/27/2018 at 5:11 pm to Dr. Carmin Muskrat , who verbally acknowledged these results. Emphysema (ICD10-J43.9). Electronically Signed   By: Ashley Royalty M.D.   On: 02/27/2018 17:11   US Venous Img Lower Unilateral Right  Result Date: 02/27/2018 CLINICAL DATA:  57 year old with right calf pain and edema. EXAM: RIGHT LOWER EXTREMITY VENOUS DOPPLER ULTRASOUND TECHNIQUE: Gray-scale sonography with graded compression, as well as color Doppler and duplex ultrasound were performed to evaluate the lower extremity deep venous systems from the level of the common femoral vein and including the common femoral, femoral, profunda femoral, popliteal and calf veins including the posterior tibial, peroneal and gastrocnemius veins when visible. The superficial great saphenous vein was also interrogated. Spectral Doppler was utilized to evaluate flow at rest and with distal augmentation maneuvers in the common femoral, femoral and popliteal veins. COMPARISON:  None. FINDINGS: Contralateral Common Femoral Vein: Respiratory phasicity is normal and symmetric with the symptomatic side. No evidence of thrombus. Normal compressibility. Common Femoral Vein: No evidence of thrombus. Normal compressibility, respiratory phasicity and response to augmentation. Saphenofemoral Junction: No evidence of thrombus. Normal compressibility and flow on color Doppler imaging. Profunda Femoral Vein: No  evidence of thrombus. Normal compressibility and flow on color Doppler imaging. Femoral Vein: Positive for thrombus. Proximal, mid and distal femoral vein is not compressible. There is echogenic thrombus throughout the femoral vein. Popliteal Vein: Positive for thrombus. Occlusive thrombus in the popliteal vein. Calf Veins: Limited evaluation of the deep calf veins but there is concern for thrombus involving the posterior tibial veins and peroneal veins. Other Findings: Right great saphenous vein appears to be compressible and patent. IMPRESSION: Positive for deep venous thrombosis in the right lower extremity. Thrombus involving the right femoral vein and right popliteal vein. Probable thrombus in right deep calf veins. Electronically Signed   By: Markus Daft M.D.   On: 02/27/2018 13:01    EKG: Independently reviewed.  Nonspecific T wave changes in lead 2 3 aVF, V2 V3.  Sinus rhythm.  QTc 501.  Assessment/Plan Active Problems:   Pulmonary embolism ([place])  Pulmonary embolism- with dyspnea chest pain.  Lower extremity swelling and pain.  With RLE confirmed DVT.  Appears unprovoked. Elevated troponin 0 0.19 mild elevated BNP 309.  CTA chest submassive PE with right heart strain.  On room air O2 sats greater than 92%.  Systolic blood pressure in the 120s. Critical care consulted by EDP recommended admission to stepdown he had EP -Continue IV heparin started in ED -Echocardiogram -BMP, CBC a.m. -Hold antihypertensives - IVF N/s + 40Kcl 75cc/hr x 12hrs  Hypokalemia K 3.1. -Check Magnesium -Replete -BMP a.m.  HTN-systolic 579J. -In the setting of submassive PE, hold home antihypertensive bisoprolol lisinopril and HCTZ.  Pre- DM-last hemoglobin A1c 01/20/2018 - 6.2.  Glucose 135  CKD3-1.8, Recent baseline 1.4-1.6. Contrast exposure today. - Hydrate - BMP a.m   DVT prophylaxis: Heparin Code Status: Full Family Communication: None at bedside Disposition Plan: pere rounding team Consults  called: None Admission status: Inpt, Step down   Rocko Fesperman Arlyce Dice MD [facility] Pager [phone] From 3PM- 11PM.  Otherwise please contact night-coverage [email or web address] Password [facility]  02/27/2018, 9:24 PM

## 2018-02-27 NOTE — Progress Notes (Addendum)
   Subjective:    Patient ID: Bradley Torres, male    DOB: Feb 01, 1961, 57 y.o.   MRN: [id number]  Chief Complaint  Patient presents with  . right upper,supine leg pain    HPI PT presents to the office today with right calf, thigh pain and swelling that he noticed about a week ago. He states the swelling and the pain has improved.   He has a NSTEMI in 01/19/18. He states he has not seen his Cardiologists in over a year. He states he has SOB that is worsening. It was recommended that he follow up with Pulmonologist for work-up on pulmonary artery hypertension.   His PCP has placed a referral for sleep study, but pt has not returned their calls.   Pt's hospital notes were reviewed.   Review of Systems  Constitutional: Positive for fatigue.  Respiratory: Positive for shortness of breath.   Cardiovascular: Positive for leg swelling.  All other systems reviewed and are negative.      Objective:   Physical Exam  Constitutional: He is oriented to person, place, and time. He appears well-developed and well-nourished. No distress.  Morbid obese  HENT:  Head: Normocephalic.  Right Ear: External ear normal.  Left Ear: External ear normal.  Mouth/Throat: Oropharynx is clear and moist.  Eyes: Pupils are equal, round, and reactive to light. Right eye exhibits no discharge. Left eye exhibits no discharge.  Neck: Normal range of motion. Neck supple. No thyromegaly present.  Cardiovascular: Normal rate, regular rhythm, normal heart sounds and intact distal pulses.  No murmur heard. Pulmonary/Chest: Effort normal. No respiratory distress. He has decreased breath sounds. He has no wheezes.  SOB with exertion   Abdominal: Soft. Bowel sounds are normal. He exhibits no distension. There is no tenderness.  Musculoskeletal: Normal range of motion. He exhibits edema (trace in right lower leg).  Negative Homan's sign  Neurological: He is alert and oriented to person, place, and time. He has normal  reflexes. No cranial nerve deficit.  Skin: Skin is warm and dry. No rash noted. No erythema.  Psychiatric: He has a normal mood and affect. His behavior is normal. Judgment and thought content normal.  Vitals reviewed.     BP 109/75   Pulse 75   Temp (!) 96.5 F (35.8 C) (Oral)   Ht 5\' 11"  (1.803 m)   Wt (!) 384 lb 6.4 oz (174.4 kg)   BMI 53.61 kg/m      Assessment & Plan:  Bradley Torres comes in today with chief complaint of right upper,supine leg pain   Diagnosis and orders addressed:  1. Right calf pain - US Venous Img Lower Unilateral Right; Future  2. SOB (shortness of breath) - Ambulatory referral to Pulmonology  3. Severe obesity (BMI >= 40) ([facility]) - Ambulatory referral to Pulmonology  4. Morbid obesity with BMI of 50.0-59.9, adult ([place])  5. Hx of non-ST elevation myocardial infarction (NSTEMI)   Pt  Has multiple risk factors for DVT. We will do doppler to rule out.  He will call his Cardiologists to make follow up appt He will make sleep study appt and Pulmonologist referral pending. Keep follow up with PCP   Doppler positive for DTV, we will send to ED since he is having increasing SOB and positive for DVT.    Evelina Dun, [name]

## 2018-02-27 NOTE — ED Notes (Signed)
CRITICAL VALUE ALERT  Critical Value:  Troponin 0.19  Date & Time Notied: 16:25 02/27/2018  Provider Notified: Dr. Laverta Baltimore  Orders Received/Actions taken: see chart

## 2018-02-27 NOTE — ED Provider Notes (Signed)
[facility] EMERGENCY DEPARTMENT Provider Note   CSN: [id number] Arrival date & time: 02/27/18  1316     History   Chief Complaint Chief Complaint  Patient presents with  . DVT    HPI Bradley Torres is a 57 y.o. male.  HPI Patient presents with concern of ongoing dyspnea, fatigue, as well as outpatient study concerning for DVT. He notes that after cardiac event about 6 weeks ago, he was recovering generally well, though he has had dyspnea since about that time. Past few days, possibly 1 week the dyspnea has increased, fatigue has increased. And, over the past 2 days, the patient has developed new pain and swelling in the right leg, with pain in the posterior thigh, knee, calf. Pain described as sharp, severe, nonradiating. Minimally improved with anything. Swelling is slightly better today than yesterday, but he went to his physicians, had ultrasound performed, and was sent here after results became available. Patient was unaware of these results on arrival here. No fever, no chest pain, no syncope. No other recent medication change, diet change.  Past Medical History:  Diagnosis Date  . Chronic back pain   . DVT (deep venous thrombosis) ([place])   . Essential hypertension   . Morbid obesity ([place])   . Panic disorder   . Prediabetes   . Shoulder pain, left    Following fall    Patient Active Problem List   Diagnosis Date Noted  . NSTEMI (non-ST elevation myocardial infarction) ([place])   . CKD (chronic kidney disease) stage 3, GFR 30-59 ml/min (HCC) 01/20/2018  . Morbid obesity with BMI of 50.0-59.9, adult ([place]) 01/20/2018  . NSTEMI (non-ST elevated myocardial infarction) ([place]) 01/19/2018  . Diarrhea 01/19/2018  . Lactic acidosis 01/19/2018  . Hypokalemia 01/19/2018  . Acute kidney injury superimposed on chronic kidney disease ([place]) 01/19/2018  . Injury of right shoulder 11/01/2017  . Disorder of left rotator cuff 11/01/2017  . [place]idal intertrigo 11/01/2017  . Hx of  adenomatous colonic polyps 07/26/2017  . Family hx of colon cancer 05/03/2017  . LLQ pain 03/14/2017  . Bowel habit changes 03/14/2017  . Prediabetes 11/15/2016  . Essential hypertension 01/23/2014  . Back pain 01/23/2014  . Severe obesity (BMI >= 40) ([place]) 01/23/2014    Past Surgical History:  Procedure Laterality Date  . Ankle fracture sugery Left   . COLONOSCOPY WITH PROPOFOL N/A 05/29/2017   Procedure: COLONOSCOPY WITH PROPOFOL;  Surgeon: Daneil Dolin, MD;  Location[facility];  Service: Endoscopy;  Laterality: N/A;  2:00pm  . LEFT HEART CATH AND CORONARY ANGIOGRAPHY N/A 01/23/2018   Procedure: LEFT HEART CATH AND CORONARY ANGIOGRAPHY;  Surgeon: Troy Sine, MD;  Location: [facility];  Service: Cardiovascular;  Laterality: N/A;  . LUMBAR SPINE SURGERY     fusion  . POLYPECTOMY  05/29/2017   Procedure: POLYPECTOMY;  Surgeon: Daneil Dolin, MD;  Location: [facility];  Service: Endoscopy;;  ascending colon polyp x5-cs transverse colon polyp x4- cs descending colon polyp x1- hs sigmoid colon polyp x1 -hs rectal polyp x1- hs  . TONSILLECTOMY          Home Medications    Prior to Admission medications   Medication Sig Start Date End Date Taking? Authorizing Provider  aspirin EC 325 MG tablet Take 325 mg by mouth daily as needed (for blood thinning).   Yes [provider]  bisoprolol-hydrochlorothiazide (ZIAC) 10-6.25 MG tablet Take 1 tablet by mouth daily. 05/01/17  Yes Ronnald Ramp,  Londell Moh, PA-C  cyclobenzaprine (FLEXERIL) 10 MG tablet TAKE 1 TABLET BY MOUTH THREE TIMES DAILY AS NEEDED Patient taking differently: Take 10 mg by mouth 3 (three) times daily as needed for muscle spasms.  01/03/17  Yes Terald Sleeper, PA-C  lisinopril-hydrochlorothiazide (PRINZIDE,ZESTORETIC) 20-25 MG tablet Take 1 tablet by mouth daily. 07/31/17  Yes Terald Sleeper, PA-C  oxyCODONE-acetaminophen (PERCOCET) 10-325 MG tablet Take 1 tablet by mouth every 8 (eight) hours as needed for  pain. 02/01/18  Yes Terald Sleeper, PA-C  terbinafine (LAMISIL) 1 % cream Apply 1 application topically 2 (two) times daily as needed (FOR ITCHY SKIN.). 08/27/17  Yes Terald Sleeper, PA-C  oxyCODONE-acetaminophen (PERCOCET) 10-325 MG tablet Take 1 tablet by mouth every 8 (eight) hours as needed for pain. 02/01/18   Terald Sleeper, PA-C  oxyCODONE-acetaminophen (PERCOCET) 10-325 MG tablet Take 1 tablet by mouth every 8 (eight) hours as needed for pain. 02/01/18   Terald Sleeper, PA-C    Family History Family History  Problem Relation Age of Onset  . Colon cancer Mother        died at age 56  . Heart attack Father   . Hepatitis Sister 42       Autoimmune  . Heart attack Paternal Grandfather     Social History Social History   Tobacco Use  . Smoking status: Never Smoker  . Smokeless tobacco: Never Used  Substance Use Topics  . Alcohol use: No  . Drug use: Yes    Types: Marijuana    Comment: occassional     Allergies   Ibuprofen; Diflucan [fluconazole]; and Losartan   Review of Systems Review of Systems  Constitutional:       Per HPI, otherwise negative  HENT:       Per HPI, otherwise negative  Respiratory:       Per HPI, otherwise negative  Cardiovascular:       Per HPI, otherwise negative  Gastrointestinal: Negative for vomiting.  Endocrine:       Negative aside from HPI  Genitourinary:       Neg aside from HPI   Musculoskeletal:       Per HPI, otherwise negative  Skin: Negative.   Neurological: Negative for syncope.  Hematological:       Per HPI     Physical Exam Updated Vital Signs BP 128/84 (BP Location: Right Wrist)   Pulse 72   Temp 97.9 F (36.6 C) (Oral)   Resp 20   Ht 5\' 11"  (1.803 m)   Wt (!) 173.7 kg   SpO2 94%   BMI 53.42 kg/m   Physical Exam  Constitutional: He is oriented to person, place, and time. He appears well-developed. No distress.  HENT:  Head: Normocephalic and atraumatic.  Eyes: Conjunctivae and EOM are normal.    Cardiovascular: Normal rate and regular rhythm.  Pulmonary/Chest: Accessory muscle usage present. No stridor. Tachypnea noted. He has decreased breath sounds. He has no wheezes.  Abdominal: He exhibits no distension.  Musculoskeletal: He exhibits no edema.  Right leg substantially bigger than the left, though the patient moves the ankle and knee appropriately.   Neurological: He is alert and oriented to person, place, and time.  Skin: Skin is warm and dry.  No discoloration of either leg. There are multiple less than 1 cm scars on both legs.  Psychiatric: He has a normal mood and affect.  Nursing note and vitals reviewed.    ED Treatments / Results  Labs (all labs ordered are listed, but only abnormal results are displayed) Labs Reviewed  BASIC METABOLIC PANEL - Abnormal; Notable for the following components:      Result Value   Potassium 3.1 (*)    Glucose, Bld 135 (*)    BUN 26 (*)    Creatinine, Ser 1.86 (*)    GFR calc non Af Amer 40 (*)    GFR calc Af Amer 46 (*)    All other components within normal limits  CBC WITH DIFFERENTIAL/PLATELET - Abnormal; Notable for the following components:   WBC 11.2 (*)    Neutro Abs 9.1 (*)    All other components within normal limits  BRAIN NATRIURETIC PEPTIDE - Abnormal; Notable for the following components:   B Natriuretic Peptide 309.0 (*)    All other components within normal limits  APTT  TROPONIN I  CBC  HEPARIN LEVEL (UNFRACTIONATED)    EKG EKG Interpretation  Date/Time:  Tuesday February 27 2018 15:58:32 EST Ventricular Rate:  63 PR Interval:    QRS Duration: 96 QT Interval:  489 QTC Calculation: 501 R Axis:   27 Text Interpretation:  Sinus rhythm Ventricular premature complex Abnormal T, consider ischemia, anterior leads Prolonged QT interval Baseline wander in lead(s) V1 Abnormal ekg Confirmed by Carmin Muskrat [phone]) on 02/27/2018 4:03:08 PM   Radiology Ct Angio Chest Pe W/cm &/or Wo Cm  Result Date:  02/27/2018 CLINICAL DATA:  Chest pain with right leg DVT. EXAM: CT ANGIOGRAPHY CHEST WITH CONTRAST TECHNIQUE: Multidetector CT imaging of the chest was performed using the standard protocol during bolus administration of intravenous contrast. Multiplanar CT image reconstructions and MIPs were obtained to evaluate the vascular anatomy. CONTRAST:  52mL ISOVUE-370 IOPAMIDOL (ISOVUE-370) INJECTION 76% COMPARISON:  09/09/2012 chest CT report, CXR 01/19/2018 FINDINGS: Cardiovascular: Saddle pulmonary embolus extending into the bilateral lobar and proximal segmental arteries are identified. RV/LV ratio equals 0.91 consistent with right heart strain. Normal heart size without pericardial effusion. Nonaneurysmal thoracic aorta without dissection or aneurysm. Unremarkable great vessels. Mediastinum/Nodes: No enlarged mediastinal, hilar, or axillary lymph nodes. Thyroid gland, trachea, and esophagus demonstrate no significant findings. Lungs/Pleura: Minimal upper lobe pulmonary blebs. No pulmonary consolidation, dominant mass, effusion or pneumothorax. Upper Abdomen: Partially included cyst off the upper pole of the right kidney measuring up to 2.1 cm. No adrenal mass. No acute upper abdominal abnormality. Musculoskeletal: Degenerative disc disease midthoracic spine. No acute nor suspicious osseous abnormalities. Review of the MIP images confirms the above findings. IMPRESSION: Positive for acute PE with CT evidence of right heart strain (RV/LV Ratio = 0.91) consistent with at least submassive (intermediate risk) PE. The presence of right heart strain has been associated with an increased risk of morbidity and mortality. Please activate Code PE by paging [phone]. These results were called by telephone at the time of interpretation on 02/27/2018 at 5:11 pm to Dr. Carmin Muskrat , who verbally acknowledged these results. Emphysema (ICD10-J43.9). Electronically Signed   By: Ashley Royalty M.D.   On: 02/27/2018 17:11   US  Venous Img Lower Unilateral Right  Result Date: 02/27/2018 CLINICAL DATA:  57 year old with right calf pain and edema. EXAM: RIGHT LOWER EXTREMITY VENOUS DOPPLER ULTRASOUND TECHNIQUE: Gray-scale sonography with graded compression, as well as color Doppler and duplex ultrasound were performed to evaluate the lower extremity deep venous systems from the level of the common femoral vein and including the common femoral, femoral, profunda femoral, popliteal and calf veins including the posterior tibial, peroneal and gastrocnemius veins when visible.  The superficial great saphenous vein was also interrogated. Spectral Doppler was utilized to evaluate flow at rest and with distal augmentation maneuvers in the common femoral, femoral and popliteal veins. COMPARISON:  None. FINDINGS: Contralateral Common Femoral Vein: Respiratory phasicity is normal and symmetric with the symptomatic side. No evidence of thrombus. Normal compressibility. Common Femoral Vein: No evidence of thrombus. Normal compressibility, respiratory phasicity and response to augmentation. Saphenofemoral Junction: No evidence of thrombus. Normal compressibility and flow on color Doppler imaging. Profunda Femoral Vein: No evidence of thrombus. Normal compressibility and flow on color Doppler imaging. Femoral Vein: Positive for thrombus. Proximal, mid and distal femoral vein is not compressible. There is echogenic thrombus throughout the femoral vein. Popliteal Vein: Positive for thrombus. Occlusive thrombus in the popliteal vein. Calf Veins: Limited evaluation of the deep calf veins but there is concern for thrombus involving the posterior tibial veins and peroneal veins. Other Findings: Right great saphenous vein appears to be compressible and patent. IMPRESSION: Positive for deep venous thrombosis in the right lower extremity. Thrombus involving the right femoral vein and right popliteal vein. Probable thrombus in right deep calf veins. Electronically  Signed   By: Markus Daft M.D.   On: 02/27/2018 13:01    Procedures Procedures (including critical care time)  Medications Ordered in ED Medications  sodium chloride 0.9 % bolus 500 mL (500 mLs Intravenous Refused 02/27/18 1618)  heparin bolus via infusion 4,000 Units (4,000 Units Intravenous Bolus from Bag 02/27/18 1749)    Followed by  heparin ADULT infusion 100 units/mL (25000 units/233mL sodium chloride 0.45%) (2,200 Units/hr Intravenous New Bag/Given 02/27/18 1749)  iopamidol (ISOVUE-370) 76 % injection 100 mL (80 mLs Intravenous Contrast Given 02/27/18 1653)     Initial Impression / Assessment and Plan / ED Course  I have reviewed the triage vital signs and the nursing notes.  Pertinent labs & imaging results that were available during my care of the patient were reviewed by me and considered in my medical decision making (see chart for details).    Review after the initial evaluation notable for hospitalization about 5 weeks ago following presentation for abdominal pain. Patient found to have elevated troponin, diverticulosis. Relevant to the patient's cardiac evaluation, he had catheterization, echocardiogram, with notation as below: Elevated troponin - No NSTEMI, is most likely demand ischemia secondary to dehydration, lactic acidosis -Initially on heparin GTT, went for cardiac cath today, which was only significant for 10% stenosis in proximal LAD, otherwise no sniffing and coronary artery disease.  6:18 PM I discussed the patient's case with our critical care colleagues after I discussed the CT scan with our radiologist. Patient found to have submassive saddle emboli on CAT scan, and on repeat exam the patient remains tachypneic, with increased work of breathing. Patient is a starting a heparin drip, and given concern for his new bilateral pulmonary embolism, ongoing DVT, increased work of breathing, he will require admission for further evaluation and management.   Final  Clinical Impressions(s) / ED Diagnoses  Pulmonary embolism  CRITICAL CARE Performed by: Carmin Muskrat Total critical care time: 40 minutes Critical care time was exclusive of separately billable procedures and treating other patients. Critical care was necessary to treat or prevent imminent or life-threatening deterioration. Critical care was time spent personally by me on the following activities: development of treatment plan with patient and/or surrogate as well as nursing, discussions with consultants, evaluation of patient's response to treatment, examination of patient, obtaining history from patient or surrogate, ordering and performing treatments  and interventions, ordering and review of laboratory studies, ordering and review of radiographic studies, pulse oximetry and re-evaluation of patient's condition.    Carmin Muskrat, MD 02/27/18 [phone]

## 2018-02-27 NOTE — Progress Notes (Signed)
Rn adv pt that IVF with Potassium in it is to be given. Pt states he does not want any IVF d/t the fact that last time he was admitted to [facility] he was pumped full of fluid and couldn't stop going to the bathroom. Pt states if possible he would rather have the pill instead of fluids- adv pt he is order a potassium pill as well as IVF. Dr. Denton Brick paged and made aware. Waiting for orders/call back.

## 2018-02-27 NOTE — Progress Notes (Signed)
ANTICOAGULATION CONSULT NOTE - Initial Consult  Pharmacy Consult for Heparin Indication: pulmonary embolus  Allergies  Allergen Reactions  . Ibuprofen Other (See Comments)    Rectal bleed  . Diflucan [Fluconazole] Rash  . Losartan Rash    Patient Measurements: Height: 5\' 11"  (180.3 cm) Weight: (!) 383 lb (173.7 kg) IBW/kg (Calculated) : 75.3 HEPARIN DW (KG): 118  Vital Signs: Temp: 97.9 F (36.6 C) (12/03 1333) Temp Source: Oral (12/03 1333) BP: 128/84 (12/03 1333) Pulse Rate: 72 (12/03 1333)  Labs: Recent Labs    02/27/18 1440  HGB 14.3  HCT 44.2  PLT 278  CREATININE 1.86*    Estimated Creatinine Clearance: 71.9 mL/min (A) (by C-G formula based on SCr of 1.86 mg/dL (H)).   Medical History: Past Medical History:  Diagnosis Date  . Chronic back pain   . DVT (deep venous thrombosis) ([place])   . Essential hypertension   . Morbid obesity ([place])   . Panic disorder   . Prediabetes   . Shoulder pain, left    Following fall    Medications:  See med rec  Assessment: Patient with recent cardiac cath presents to ED with SOB, fatigue and outpatient study concerning for DVT. CT angiogram done and positive for PE. Pharmacy asked to start heparin.  Goal of Therapy:  Heparin level 0.3-0.7 units/ml Monitor platelets by anticoagulation protocol: Yes   Plan:  Give 4000 units bolus x 1 Start heparin infusion at 2200 units/hr Check anti-Xa level in 6-8 hours and daily while on heparin Continue to monitor H&H and platelets  Trenton Gammon, Mohammad Granade L 02/27/2018,5:26 PM

## 2018-02-28 LAB — CBC
HCT: 41.9 % (ref 39.0–52.0)
Hemoglobin: 13.3 g/dL (ref 13.0–17.0)
MCH: 31.2 pg (ref 26.0–34.0)
MCHC: 31.7 g/dL (ref 30.0–36.0)
MCV: 98.4 fL (ref 80.0–100.0)
Platelets: 236 10*3/uL (ref 150–400)
RBC: 4.26 MIL/uL (ref 4.22–5.81)
RDW: 13 % (ref 11.5–15.5)
WBC: 7.2 10*3/uL (ref 4.0–10.5)
nRBC: 0 % (ref 0.0–0.2)

## 2018-02-28 LAB — BASIC METABOLIC PANEL
Anion gap: 8 (ref 5–15)
BUN: 25 mg/dL — ABNORMAL HIGH (ref 6–20)
CALCIUM: 8.6 mg/dL — AB (ref 8.9–10.3)
CO2: 28 mmol/L (ref 22–32)
Chloride: 103 mmol/L (ref 98–111)
Creatinine, Ser: 1.66 mg/dL — ABNORMAL HIGH (ref 0.61–1.24)
GFR calc Af Amer: 53 mL/min — ABNORMAL LOW (ref >60)
GFR calc non Af Amer: 45 mL/min — ABNORMAL LOW (ref >60)
Glucose, Bld: 119 mg/dL — ABNORMAL HIGH (ref 70–99)
Potassium: 3.9 mmol/L (ref 3.5–5.1)
Sodium: 139 mmol/L (ref 135–145)

## 2018-02-28 LAB — MRSA PCR SCREENING: MRSA by PCR: NEGATIVE

## 2018-02-28 LAB — HEPARIN LEVEL (UNFRACTIONATED)
Heparin Unfractionated: 0.77 IU/mL — ABNORMAL HIGH (ref 0.30–0.70)
Heparin Unfractionated: 0.47 IU/mL (ref 0.30–0.70)
Heparin Unfractionated: 0.44 IU/mL (ref 0.30–0.70)

## 2018-02-28 NOTE — Progress Notes (Signed)
ANTICOAGULATION CONSULT NOTE - Follow Up Consult  Pharmacy Consult for heparin Indication: PE/DVT  Labs: Recent Labs    02/27/18 1440 02/27/18 2349  HGB 14.3  --   HCT 44.2  --   PLT 278  --   APTT 29  --   HEPARINUNFRC  --  0.77*  CREATININE 1.86*  --   TROPONINI 0.19*  --     Assessment: 57yo male supratherapeutic on heparin with initial dosing for PE/DVT; RN reports no gtt issues or signs of bleeding.  Goal of Therapy:  Heparin level 0.3-0.7 units/ml   Plan:  Will decrease heparin gtt by 1-2 units/kgABW/hr to 2000 units/hr and check level in 6 hours.    Wynona Neat, PharmD, BCPS  02/28/2018,1:24 AM

## 2018-02-28 NOTE — Progress Notes (Signed)
ANTICOAGULATION CONSULT NOTE - follow up [place] for Heparin Indication: pulmonary embolus  Allergies  Allergen Reactions  . Ibuprofen Other (See Comments)    Rectal bleed  . Diflucan [Fluconazole] Rash  . Losartan Rash    Patient Measurements: Height: 5\' 11"  (180.3 cm) Weight: (!) 376 lb 15.8 oz (171 kg) IBW/kg (Calculated) : 75.3 HEPARIN DW (KG): 117.2  Vital Signs: Temp: 97.5 F (36.4 C) (12/04 0811) Temp Source: Oral (12/04 0811) BP: 122/71 (12/04 0700) Pulse Rate: 68 (12/04 0700)  Labs: Recent Labs    02/27/18 1440 02/27/18 2349 02/28/18 0724  HGB 14.3  --  13.3  HCT 44.2  --  41.9  PLT 278  --  236  APTT 29  --   --   HEPARINUNFRC  --  0.77* 0.47  CREATININE 1.86*  --  1.66*  TROPONINI 0.19*  --   --     Estimated Creatinine Clearance: 79.8 mL/min (A) (by C-G formula based on SCr of 1.66 mg/dL (H)).   Medical History: Past Medical History:  Diagnosis Date  . Chronic back pain   . DVT (deep venous thrombosis) ([place])   . Essential hypertension   . Morbid obesity ([place])   . Panic disorder   . Prediabetes   . Shoulder pain, left    Following fall    Medications:  See med rec  Assessment: Patient with recent cardiac cath presents to ED with SOB, fatigue and outpatient study concerning for DVT. CT angiogram done and positive for PE. Pharmacy asked to start heparin. Heparin level this AM is therapeutic.   Goal of Therapy:  Heparin level 0.3-0.7 units/ml Monitor platelets by anticoagulation protocol: Yes   Plan:  Continue heparin infusion at 2000 units/hr Check anti-Xa level in 6-8 hours and daily while on heparin Continue to monitor H&H and platelets  Isac Sarna, BS Vena [place], BCPS Clinical Pharmacist Pager [phone] 02/28/2018,8:22 AM

## 2018-02-28 NOTE — Progress Notes (Signed)
Report given to 300 nurse and patient take to room via wheelchair.

## 2018-02-28 NOTE — Progress Notes (Signed)
ANTICOAGULATION CONSULT NOTE - follow up [place] for Heparin Indication: pulmonary embolus  Allergies  Allergen Reactions  . Ibuprofen Other (See Comments)    Rectal bleed  . Diflucan [Fluconazole] Rash  . Losartan Rash    Patient Measurements: Height: 5\' 11"  (180.3 cm) Weight: (!) 376 lb 15.8 oz (171 kg) IBW/kg (Calculated) : 75.3 HEPARIN DW (KG): 117.2  Vital Signs: Temp: 97.5 F (36.4 C) (12/04 0811) Temp Source: Oral (12/04 0811) BP: 107/76 (12/04 1400) Pulse Rate: 63 (12/04 1400)  Labs: Recent Labs    02/27/18 1440 02/27/18 2349 02/28/18 0724 02/28/18 1543  HGB 14.3  --  13.3  --   HCT 44.2  --  41.9  --   PLT 278  --  236  --   APTT 29  --   --   --   HEPARINUNFRC  --  0.77* 0.47 0.44  CREATININE 1.86*  --  1.66*  --   TROPONINI 0.19*  --   --   --     Estimated Creatinine Clearance: 79.8 mL/min (A) (by C-G formula based on SCr of 1.66 mg/dL (H)).   Medical History: Past Medical History:  Diagnosis Date  . Chronic back pain   . DVT (deep venous thrombosis) ([place])   . Essential hypertension   . Morbid obesity ([place])   . Panic disorder   . Prediabetes   . Shoulder pain, left    Following fall    Medications:  See med rec  Assessment: Patient with recent cardiac cath presents to ED with SOB, fatigue and outpatient study concerning for DVT. CT angiogram done and positive for PE. Pharmacy asked to start heparin. Heparin level is therapeutic.   Goal of Therapy:  Heparin level 0.3-0.7 units/ml Monitor platelets by anticoagulation protocol: Yes   Plan:  Continue heparin infusion at 2000 units/hr Check anti-Xa level  daily while on heparin Continue to monitor H&H and platelets  Margot Ables, PharmD Clinical Pharmacist 02/28/2018 5:11 PM

## 2018-02-28 NOTE — Progress Notes (Signed)
PROGRESS NOTE    Bradley Torres  EXB:[id number] DOB: Mar 28, 1961 DOA: 02/27/2018 PCP: Terald Sleeper, PA-C   Brief Narrative:  Per HPI from Dr. Denton Brick: Bradley Torres is a 57 y.o. male with medical history significant for morbid obesity BMI 53, HTN, Pre DM, NSTEMI, CKD3, who was sent to the ED by primary care provider.  Patient complained of right lower extremity pain and swelling for about a week which was actually proving, but lower extremity Dopplers were done and were positive for DVT.  Patient reports shortness of breath over the past month since discharge from the hospital, worse with exertion and left-sided chest pain over the past 4 days.  Also associated with chest pain was right arm numbness without weakness that lasted 2 days and resolved. No recent travels within the past 3 months, last trip was 4-hour drive to [place] in June.  No recent surgeries.  Family history of blood clots in maternal grandmother.  Recent admission 01/23/2018-with elevated troponin, transfered to [facility], cardiac cath- no significant coronary artery disease with essentially normal coronary arteries, and only mild luminal irregularity of the proximal LAD of 10%.    Patient has been admitted for acute submassive PE and has been started on IV heparin drip.  He is otherwise doing well and is not on oxygen with stable vital signs noted.  He is noted to have right lower extremity DVT as well.   Assessment & Plan:   Active Problems:   Pulmonary embolism ([place])  1. Acute submassive PE with right lower extremity DVT.  2D echocardiogram currently pending.  Vital signs are stable and oxygen saturation on room air greater than 92%.  Continue IV heparin as initiated in ED.  Monitor labs.  Anticipate maintaining on IV heparin for 4 more days until discharge on oral anticoagulation. 2. Hypokalemia.  Resolved this a.m. after repletion with magnesium level within normal limits.  Recheck BMP in  a.m. 3. Hypertension.  Continue to hold home blood pressure agents due to soft blood pressure readings and monitor.  Anticipate restarting these medications once blood pressure starts to trend up. 4. Prediabetes.  Glucose well controlled.  Hemoglobin A1c 6.2% on 12/2017. 5. CKD stage III-stable.  Continue to monitor BMP in a.m.    DVT prophylaxis: IV heparin drip Code Status: Full code Family Communication: None at bedside Disposition Plan: Continue IV heparin up to 5 days and then transition to Xarelto on discharge; transfer to telemetry floor   Consultants:   None  Procedures:   None  Antimicrobials:   None   Subjective: Patient seen and evaluated today with no new acute complaints or concerns. No acute concerns or events noted overnight.  He would like to sit up if possible.  Objective: Vitals:   02/28/18 0900 02/28/18 1000 02/28/18 1100 02/28/18 1200  BP: 120/80 117/78 109/70 118/85  Pulse: 66 69 63 70  Resp: 13 17 19 15   Temp:      TempSrc:      SpO2: 96% 94% 94% 96%  Weight:      Height:        Intake/Output Summary (Last 24 hours) at 02/28/2018 1342 Last data filed at 02/28/2018 1200 Gross per 24 hour  Intake 1066.89 ml  Output 925 ml  Net 141.89 ml   Filed Weights   02/27/18 1334 02/27/18 2130  Weight: (!) 173.7 kg (!) 171 kg    Examination:  General exam: Appears calm and comfortable, obese Respiratory system: Clear to  auscultation. Respiratory effort normal. Cardiovascular system: S1 & S2 heard, RRR. No JVD, murmurs, rubs, gallops or clicks. No pedal edema. Gastrointestinal system: Abdomen is nondistended, soft and nontender. No organomegaly or masses felt. Normal bowel sounds heard. Central nervous system: Alert and oriented. No focal neurological deficits. Extremities: Symmetric 5 x 5 power. Skin: No rashes, lesions or ulcers Psychiatry: Judgement and insight appear normal. Mood & affect appropriate.     Data Reviewed: I have personally  reviewed following labs and imaging studies  CBC: Recent Labs  Lab 02/27/18 1440 02/28/18 0724  WBC 11.2* 7.2  NEUTROABS 9.1*  --   HGB 14.3 13.3  HCT 44.2 41.9  MCV 95.9 98.4  PLT 278 382   Basic Metabolic Panel: Recent Labs  Lab 02/27/18 1440 02/28/18 0724  NA 138 139  K 3.1* 3.9  CL 101 103  CO2 26 28  GLUCOSE 135* 119*  BUN 26* 25*  CREATININE 1.86* 1.66*  CALCIUM 9.0 8.6*  MG 2.1  --    GFR: Estimated Creatinine Clearance: 79.8 mL/min (A) (by C-G formula based on SCr of 1.66 mg/dL (H)). Liver Function Tests: No results for input(s): AST, ALT, ALKPHOS, BILITOT, PROT, ALBUMIN in the last 168 hours. No results for input(s): LIPASE, AMYLASE in the last 168 hours. No results for input(s): AMMONIA in the last 168 hours. Coagulation Profile: No results for input(s): INR, PROTIME in the last 168 hours. Cardiac Enzymes: Recent Labs  Lab 02/27/18 1440  TROPONINI 0.19*   BNP (last 3 results) No results for input(s): PROBNP in the last 8760 hours. HbA1C: No results for input(s): HGBA1C in the last 72 hours. CBG: No results for input(s): GLUCAP in the last 168 hours. Lipid Profile: No results for input(s): CHOL, HDL, LDLCALC, TRIG, CHOLHDL, LDLDIRECT in the last 72 hours. Thyroid Function Tests: No results for input(s): TSH, T4TOTAL, FREET4, T3FREE, THYROIDAB in the last 72 hours. Anemia Panel: No results for input(s): VITAMINB12, FOLATE, FERRITIN, TIBC, IRON, RETICCTPCT in the last 72 hours. Sepsis Labs: No results for input(s): PROCALCITON, LATICACIDVEN in the last 168 hours.  Recent Results (from the past 240 hour(s))  MRSA PCR Screening     Status: None   Collection Time: 02/27/18  8:55 PM  Result Value Ref Range Status   MRSA by PCR NEGATIVE NEGATIVE Final    Comment:        The GeneXpert MRSA Assay (FDA approved for NASAL specimens only), is one component of a comprehensive MRSA colonization surveillance program. It is not intended to diagnose  MRSA infection nor to guide or monitor treatment for MRSA infections. Performed at [facility], [address], [place], [place] [address]          Radiology Studies: Ct Angio Chest Pe W/cm &/or Wo Cm  Result Date: 02/27/2018 CLINICAL DATA:  Chest pain with right leg DVT. EXAM: CT ANGIOGRAPHY CHEST WITH CONTRAST TECHNIQUE: Multidetector CT imaging of the chest was performed using the standard protocol during bolus administration of intravenous contrast. Multiplanar CT image reconstructions and MIPs were obtained to evaluate the vascular anatomy. CONTRAST:  43mL ISOVUE-370 IOPAMIDOL (ISOVUE-370) INJECTION 76% COMPARISON:  09/09/2012 chest CT report, CXR 01/19/2018 FINDINGS: Cardiovascular: Saddle pulmonary embolus extending into the bilateral lobar and proximal segmental arteries are identified. RV/LV ratio equals 0.91 consistent with right heart strain. Normal heart size without pericardial effusion. Nonaneurysmal thoracic aorta without dissection or aneurysm. Unremarkable great vessels. Mediastinum/Nodes: No enlarged mediastinal, hilar, or axillary lymph nodes. Thyroid gland, trachea, and esophagus demonstrate no  significant findings. Lungs/Pleura: Minimal upper lobe pulmonary blebs. No pulmonary consolidation, dominant mass, effusion or pneumothorax. Upper Abdomen: Partially included cyst off the upper pole of the right kidney measuring up to 2.1 cm. No adrenal mass. No acute upper abdominal abnormality. Musculoskeletal: Degenerative disc disease midthoracic spine. No acute nor suspicious osseous abnormalities. Review of the MIP images confirms the above findings. IMPRESSION: Positive for acute PE with CT evidence of right heart strain (RV/LV Ratio = 0.91) consistent with at least submassive (intermediate risk) PE. The presence of right heart strain has been associated with an increased risk of morbidity and mortality. Please activate Code PE by paging [phone]. These results were called by  telephone at the time of interpretation on 02/27/2018 at 5:11 pm to Dr. Carmin Muskrat , who verbally acknowledged these results. Emphysema (ICD10-J43.9). Electronically Signed   By: Ashley Royalty M.D.   On: 02/27/2018 17:11   US Venous Img Lower Unilateral Right  Result Date: 02/27/2018 CLINICAL DATA:  57 year old with right calf pain and edema. EXAM: RIGHT LOWER EXTREMITY VENOUS DOPPLER ULTRASOUND TECHNIQUE: Gray-scale sonography with graded compression, as well as color Doppler and duplex ultrasound were performed to evaluate the lower extremity deep venous systems from the level of the common femoral vein and including the common femoral, femoral, profunda femoral, popliteal and calf veins including the posterior tibial, peroneal and gastrocnemius veins when visible. The superficial great saphenous vein was also interrogated. Spectral Doppler was utilized to evaluate flow at rest and with distal augmentation maneuvers in the common femoral, femoral and popliteal veins. COMPARISON:  None. FINDINGS: Contralateral Common Femoral Vein: Respiratory phasicity is normal and symmetric with the symptomatic side. No evidence of thrombus. Normal compressibility. Common Femoral Vein: No evidence of thrombus. Normal compressibility, respiratory phasicity and response to augmentation. Saphenofemoral Junction: No evidence of thrombus. Normal compressibility and flow on color Doppler imaging. Profunda Femoral Vein: No evidence of thrombus. Normal compressibility and flow on color Doppler imaging. Femoral Vein: Positive for thrombus. Proximal, mid and distal femoral vein is not compressible. There is echogenic thrombus throughout the femoral vein. Popliteal Vein: Positive for thrombus. Occlusive thrombus in the popliteal vein. Calf Veins: Limited evaluation of the deep calf veins but there is concern for thrombus involving the posterior tibial veins and peroneal veins. Other Findings: Right great saphenous vein appears to be  compressible and patent. IMPRESSION: Positive for deep venous thrombosis in the right lower extremity. Thrombus involving the right femoral vein and right popliteal vein. Probable thrombus in right deep calf veins. Electronically Signed   By: Markus Daft M.D.   On: 02/27/2018 13:01        Scheduled Meds: Continuous Infusions: . heparin 2,000 Units/hr (02/28/18 0353)  . sodium chloride       LOS: 1 day    Time spent: 30 minutes    Silvina Hackleman Darleen Crocker, DO [facility] Pager [phone]  If 7PM-7AM, please contact night-coverage [email or web address] Password [email or web address] 02/28/2018, 1:42 PM

## 2018-03-01 ENCOUNTER — Inpatient Hospital Stay (HOSPITAL_COMMUNITY): Payer: Medicaid Other

## 2018-03-01 DIAGNOSIS — I361 Nonrheumatic tricuspid (valve) insufficiency: Secondary | ICD-10-CM

## 2018-03-01 LAB — CBC
HCT: 40.2 % (ref 39.0–52.0)
Hemoglobin: 12.8 g/dL — ABNORMAL LOW (ref 13.0–17.0)
MCH: 31.3 pg (ref 26.0–34.0)
MCHC: 31.8 g/dL (ref 30.0–36.0)
MCV: 98.3 fL (ref 80.0–100.0)
Platelets: 228 10*3/uL (ref 150–400)
RBC: 4.09 MIL/uL — ABNORMAL LOW (ref 4.22–5.81)
RDW: 12.8 % (ref 11.5–15.5)
WBC: 6.6 10*3/uL (ref 4.0–10.5)
nRBC: 0 % (ref 0.0–0.2)

## 2018-03-01 LAB — BASIC METABOLIC PANEL
Anion gap: 8 (ref 5–15)
BUN: 21 mg/dL — ABNORMAL HIGH (ref 6–20)
CO2: 29 mmol/L (ref 22–32)
Calcium: 8.8 mg/dL — ABNORMAL LOW (ref 8.9–10.3)
Chloride: 103 mmol/L (ref 98–111)
Creatinine, Ser: 1.49 mg/dL — ABNORMAL HIGH (ref 0.61–1.24)
GFR calc Af Amer: 60 mL/min — ABNORMAL LOW (ref >60)
GFR calc non Af Amer: 52 mL/min — ABNORMAL LOW (ref >60)
Glucose, Bld: 102 mg/dL — ABNORMAL HIGH (ref 70–99)
Potassium: 3.5 mmol/L (ref 3.5–5.1)
Sodium: 140 mmol/L (ref 135–145)

## 2018-03-01 LAB — ECHOCARDIOGRAM COMPLETE
Height: 71 in
Weight: 6031.79 oz

## 2018-03-01 LAB — HEPARIN LEVEL (UNFRACTIONATED): Heparin Unfractionated: 0.53 IU/mL (ref 0.30–0.70)

## 2018-03-01 MED ORDER — HYDROCODONE-ACETAMINOPHEN 5-325 MG PO TABS
1.0000 | ORAL_TABLET | Freq: Four times a day (QID) | ORAL | Status: DC | PRN
  Administered 2018-03-01: 1 via ORAL
  Filled 2018-03-01: qty 1

## 2018-03-01 MED ORDER — PERFLUTREN LIPID MICROSPHERE
1.0000 mL | INTRAVENOUS | Status: AC | PRN
  Administered 2018-03-01: 2 mL via INTRAVENOUS

## 2018-03-01 NOTE — Progress Notes (Signed)
PROGRESS NOTE    Bradley Torres  CBS:[id number] DOB: 1960/05/02 DOA: 02/27/2018 PCP: Terald Sleeper, PA-C   Brief Narrative:  Per HPI from Dr. Denton Brick: Bradley Hua Butleris a 57 y.o.malewith medical history significantfor morbid obesity BMI 53,HTN, Pre DM, NSTEMI, CKD3,who was sent tothe EDby primary care provider.Patient complained of right lower extremity pain and swelling for about a week which was actually proving, but lower extremity Dopplers were done and were positive for DVT. Patient reports shortness of breath over the past month since discharge from the hospital, worse with exertion and left-sided chest pain over the past 4 days. Also associated with chest pain was right arm numbness without weakness that lasted 2 days and resolved. No recent travels within the past 3 months,last trip was 4-hour drive to [place] in June. No recent surgeries. Family history of blood clots in maternal grandmother.  Recent admission 01/23/2018-withelevated troponin, transferedto Bradley Torres, cardiac cath- nosignificant coronary artery disease with essentially normal coronary arteries,and only mild luminal irregularity of the proximal LAD of 10%.  Patient has been admitted for acute submassive PE and has been started on IV heparin drip.  He is otherwise doing well and is not on oxygen with stable vital signs noted.  He is noted to have right lower extremity DVT as well.   Assessment & Plan:   Active Problems:   Pulmonary embolism ([place])  1. Acute submassive PE with right lower extremity DVT.  2D echocardiogram currently pending, but has been performed this morning.  Vital signs are stable and oxygen saturation on room air greater than 92%.  Continue IV heparin as initiated in ED.  Monitor labs.  Anticipate maintaining on IV heparin for 3 more days until discharge on oral anticoagulation. 2. Hypokalemia-resolved.  Continue to monitor BMP. 3. Hypertension.  Continue to hold home  blood pressure agents due to soft blood pressure readings and monitor.  Anticipate restarting these medications once blood pressure starts to trend up. 4. Prediabetes.  Glucose well controlled.  Hemoglobin A1c 6.2% on 12/2017. 5. CKD stage III-stable.  Continue to monitor BMP in a.m.    DVT prophylaxis: IV heparin drip Code Status: Full code Family Communication: None at bedside Disposition Plan: Continue IV heparin up to 5 days and then transition to Xarelto on discharge; may consider earlier discharge based on echo results if no significant RV strain noted with Lovenox injections at home to complete a total of 5 days.   Consultants:   None  Procedures:   None  Antimicrobials:   None   Subjective: Patient seen and evaluated today with no new acute complaints or concerns. No acute concerns or events noted overnight.  He remained stable off oxygen without any symptoms.  He is awaiting 2D echocardiogram this morning.  Objective: Vitals:   02/28/18 1300 02/28/18 1400 02/28/18 2111 03/01/18 0604  BP: 110/72 107/76 127/77 124/72  Pulse: 71 63 70 63  Resp: (!) 22 12 20 18   Temp:   97.6 F (36.4 C) (!) 97.3 F (36.3 C)  TempSrc:   Oral Oral  SpO2: 94% 95% 95% 96%  Weight:      Height:        Intake/Output Summary (Last 24 hours) at 03/01/2018 1033 Last data filed at 02/28/2018 1806 Gross per 24 hour  Intake 240 ml  Output 250 ml  Net -10 ml   Filed Weights   02/27/18 1334 02/27/18 2130  Weight: (!) 173.7 kg (!) 171 kg    Examination:  General exam: Appears calm and comfortable, obese Respiratory system: Clear to auscultation. Respiratory effort normal. Cardiovascular system: S1 & S2 heard, RRR. No JVD, murmurs, rubs, gallops or clicks. No pedal edema. Gastrointestinal system: Abdomen is nondistended, soft and nontender. No organomegaly or masses felt. Normal bowel sounds heard. Central nervous system: Alert and oriented. No focal neurological  deficits. Extremities: Symmetric 5 x 5 power. Skin: No rashes, lesions or ulcers Psychiatry: Judgement and insight appear normal. Mood & affect appropriate.     Data Reviewed: I have personally reviewed following labs and imaging studies  CBC: Recent Labs  Lab 02/27/18 1440 02/28/18 0724 03/01/18 0438  WBC 11.2* 7.2 6.6  NEUTROABS 9.1*  --   --   HGB 14.3 13.3 12.8*  HCT 44.2 41.9 40.2  MCV 95.9 98.4 98.3  PLT 278 236 161   Basic Metabolic Panel: Recent Labs  Lab 02/27/18 1440 02/28/18 0724 03/01/18 0438  NA 138 139 140  K 3.1* 3.9 3.5  CL 101 103 103  CO2 26 28 29   GLUCOSE 135* 119* 102*  BUN 26* 25* 21*  CREATININE 1.86* 1.66* 1.49*  CALCIUM 9.0 8.6* 8.8*  MG 2.1  --   --    GFR: Estimated Creatinine Clearance: 88.9 mL/min (A) (by C-G formula based on SCr of 1.49 mg/dL (H)). Liver Function Tests: No results for input(s): AST, ALT, ALKPHOS, BILITOT, PROT, ALBUMIN in the last 168 hours. No results for input(s): LIPASE, AMYLASE in the last 168 hours. No results for input(s): AMMONIA in the last 168 hours. Coagulation Profile: No results for input(s): INR, PROTIME in the last 168 hours. Cardiac Enzymes: Recent Labs  Lab 02/27/18 1440  TROPONINI 0.19*   BNP (last 3 results) No results for input(s): PROBNP in the last 8760 hours. HbA1C: No results for input(s): HGBA1C in the last 72 hours. CBG: No results for input(s): GLUCAP in the last 168 hours. Lipid Profile: No results for input(s): CHOL, HDL, LDLCALC, TRIG, CHOLHDL, LDLDIRECT in the last 72 hours. Thyroid Function Tests: No results for input(s): TSH, T4TOTAL, FREET4, T3FREE, THYROIDAB in the last 72 hours. Anemia Panel: No results for input(s): VITAMINB12, FOLATE, FERRITIN, TIBC, IRON, RETICCTPCT in the last 72 hours. Sepsis Labs: No results for input(s): PROCALCITON, LATICACIDVEN in the last 168 hours.  Recent Results (from the past 240 hour(s))  MRSA PCR Screening     Status: None   Collection  Time: 02/27/18  8:55 PM  Result Value Ref Range Status   MRSA by PCR NEGATIVE NEGATIVE Final    Comment:        The GeneXpert MRSA Assay (FDA approved for NASAL specimens only), is one component of a comprehensive MRSA colonization surveillance program. It is not intended to diagnose MRSA infection nor to guide or monitor treatment for MRSA infections. Performed at [facility], [address], [place], [place] [address]          Radiology Studies: Ct Angio Chest Pe W/cm &/or Wo Cm  Result Date: 02/27/2018 CLINICAL DATA:  Chest pain with right leg DVT. EXAM: CT ANGIOGRAPHY CHEST WITH CONTRAST TECHNIQUE: Multidetector CT imaging of the chest was performed using the standard protocol during bolus administration of intravenous contrast. Multiplanar CT image reconstructions and MIPs were obtained to evaluate the vascular anatomy. CONTRAST:  52mL ISOVUE-370 IOPAMIDOL (ISOVUE-370) INJECTION 76% COMPARISON:  09/09/2012 chest CT report, CXR 01/19/2018 FINDINGS: Cardiovascular: Saddle pulmonary embolus extending into the bilateral lobar and proximal segmental arteries are identified. RV/LV ratio equals 0.91 consistent with right  heart strain. Normal heart size without pericardial effusion. Nonaneurysmal thoracic aorta without dissection or aneurysm. Unremarkable great vessels. Mediastinum/Nodes: No enlarged mediastinal, hilar, or axillary lymph nodes. Thyroid gland, trachea, and esophagus demonstrate no significant findings. Lungs/Pleura: Minimal upper lobe pulmonary blebs. No pulmonary consolidation, dominant mass, effusion or pneumothorax. Upper Abdomen: Partially included cyst off the upper pole of the right kidney measuring up to 2.1 cm. No adrenal mass. No acute upper abdominal abnormality. Musculoskeletal: Degenerative disc disease midthoracic spine. No acute nor suspicious osseous abnormalities. Review of the MIP images confirms the above findings. IMPRESSION: Positive for acute PE with CT  evidence of right heart strain (RV/LV Ratio = 0.91) consistent with at least submassive (intermediate risk) PE. The presence of right heart strain has been associated with an increased risk of morbidity and mortality. Please activate Code PE by paging [phone]. These results were called by telephone at the time of interpretation on 02/27/2018 at 5:11 pm to Dr. Carmin Muskrat , who verbally acknowledged these results. Emphysema (ICD10-J43.9). Electronically Signed   By: Ashley Royalty M.D.   On: 02/27/2018 17:11   US Venous Img Lower Unilateral Right  Result Date: 02/27/2018 CLINICAL DATA:  57 year old with right calf pain and edema. EXAM: RIGHT LOWER EXTREMITY VENOUS DOPPLER ULTRASOUND TECHNIQUE: Gray-scale sonography with graded compression, as well as color Doppler and duplex ultrasound were performed to evaluate the lower extremity deep venous systems from the level of the common femoral vein and including the common femoral, femoral, profunda femoral, popliteal and calf veins including the posterior tibial, peroneal and gastrocnemius veins when visible. The superficial great saphenous vein was also interrogated. Spectral Doppler was utilized to evaluate flow at rest and with distal augmentation maneuvers in the common femoral, femoral and popliteal veins. COMPARISON:  None. FINDINGS: Contralateral Common Femoral Vein: Respiratory phasicity is normal and symmetric with the symptomatic side. No evidence of thrombus. Normal compressibility. Common Femoral Vein: No evidence of thrombus. Normal compressibility, respiratory phasicity and response to augmentation. Saphenofemoral Junction: No evidence of thrombus. Normal compressibility and flow on color Doppler imaging. Profunda Femoral Vein: No evidence of thrombus. Normal compressibility and flow on color Doppler imaging. Femoral Vein: Positive for thrombus. Proximal, mid and distal femoral vein is not compressible. There is echogenic thrombus throughout the  femoral vein. Popliteal Vein: Positive for thrombus. Occlusive thrombus in the popliteal vein. Calf Veins: Limited evaluation of the deep calf veins but there is concern for thrombus involving the posterior tibial veins and peroneal veins. Other Findings: Right great saphenous vein appears to be compressible and patent. IMPRESSION: Positive for deep venous thrombosis in the right lower extremity. Thrombus involving the right femoral vein and right popliteal vein. Probable thrombus in right deep calf veins. Electronically Signed   By: Markus Daft M.D.   On: 02/27/2018 13:01        Scheduled Meds: Continuous Infusions: . heparin 2,000 Units/hr (03/01/18 0835)  . sodium chloride       LOS: 2 days    Time spent: 30 minutes    Pratik Darleen Crocker, DO [facility] Pager [phone]  If 7PM-7AM, please contact night-coverage [email or web address] Password [email or web address] 03/01/2018, 10:33 AM

## 2018-03-01 NOTE — Progress Notes (Signed)
*  PRELIMINARY RESULTS* Echocardiogram 2D Echocardiogram has been performed.  Bradley Torres 03/01/2018, 10:23 AM

## 2018-03-01 NOTE — Care Management Note (Addendum)
Case Management Note  Patient Details  Name: ZARIAN COLPITTS MRN: [id number] Date of Birth: 1960/12/02  Subjective/Objective:       Pulmonary Embolism. Echo done today, pending results may DC on Lovenox.              Action/Plan: Patient has medicaid, enoxaparin is on the preferred Medicaid List.  Xarelto is also on the preferred Medicaid LIst.   ADDENDUM: 03/02/2018:  Patient discharging home today on Xarelto. 30 day coupon given. Patient uses [facility], verified they have Xarelto in stock. Patient updated.   Expected Discharge Date:    03/02/2018          Expected Discharge Plan:  Home/Self Care  In-House Referral:     Discharge planning Services  CM Consult, Medication Assistance  Post Acute Care Choice:  NA Choice offered to:  NA  DME Arranged:    DME Agency:     HH Arranged:    HH Agency:     Status of Service:  Completed, signed off  If discussed at [employer] of Stay Meetings, dates discussed:    Additional Comments:  Verbon Giangregorio, Chauncey Reading, RN 03/01/2018, 11:10 AM

## 2018-03-01 NOTE — Progress Notes (Signed)
ANTICOAGULATION CONSULT NOTE - follow up [facility] for Heparin Indication: pulmonary embolus  Allergies  Allergen Reactions  . Ibuprofen Other (See Comments)    Rectal bleed  . Diflucan [Fluconazole] Rash  . Losartan Rash    Patient Measurements: Height: 5\' 11"  (180.3 cm) Weight: (!) 376 lb 15.8 oz (171 kg) IBW/kg (Calculated) : 75.3 HEPARIN DW (KG): 117.2  Vital Signs: Temp: 97.3 F (36.3 C) (12/05 0604) Temp Source: Oral (12/05 0604) BP: 124/72 (12/05 0604) Pulse Rate: 63 (12/05 0604)  Labs: Recent Labs    02/27/18 1440  02/28/18 0724 02/28/18 1543 03/01/18 0438  HGB 14.3  --  13.3  --  12.8*  HCT 44.2  --  41.9  --  40.2  PLT 278  --  236  --  228  APTT 29  --   --   --   --   HEPARINUNFRC  --    < > 0.47 0.44 0.53  CREATININE 1.86*  --  1.66*  --  1.49*  TROPONINI 0.19*  --   --   --   --    < > = values in this interval not displayed.    Estimated Creatinine Clearance: 88.9 mL/min (A) (by C-G formula based on SCr of 1.49 mg/dL (H)).   Medical History: Past Medical History:  Diagnosis Date  . Chronic back pain   . DVT (deep venous thrombosis) ([place])   . Essential hypertension   . Morbid obesity ([place])   . Panic disorder   . Prediabetes   . Shoulder pain, left    Following fall    Medications:  See med rec  Assessment: Patient with recent cardiac cath presents to ED with SOB, fatigue and outpatient study concerning for DVT. CT angiogram done and positive for PE. Pharmacy asked to start heparin. Heparin level is therapeutic.   Goal of Therapy:  Heparin level 0.3-0.7 units/ml Monitor platelets by anticoagulation protocol: Yes   Plan:  Continue heparin infusion at 2000 units/hr Check anti-Xa level  daily while on heparin Continue to monitor H&H and platelets  Margot Ables, PharmD Clinical Pharmacist 03/01/2018 7:47 AM

## 2018-03-02 DIAGNOSIS — G894 Chronic pain syndrome: Secondary | ICD-10-CM

## 2018-03-02 DIAGNOSIS — E876 Hypokalemia: Secondary | ICD-10-CM

## 2018-03-02 DIAGNOSIS — I10 Essential (primary) hypertension: Secondary | ICD-10-CM

## 2018-03-02 DIAGNOSIS — N182 Chronic kidney disease, stage 2 (mild): Secondary | ICD-10-CM

## 2018-03-02 DIAGNOSIS — I5032 Chronic diastolic (congestive) heart failure: Secondary | ICD-10-CM

## 2018-03-02 LAB — BASIC METABOLIC PANEL
Anion gap: 12 (ref 5–15)
BUN: 16 mg/dL (ref 6–20)
CALCIUM: 9 mg/dL (ref 8.9–10.3)
CO2: 25 mmol/L (ref 22–32)
Chloride: 102 mmol/L (ref 98–111)
Creatinine, Ser: 1.42 mg/dL — ABNORMAL HIGH (ref 0.61–1.24)
GFR calc Af Amer: >60 mL/min (ref >60)
GFR calc non Af Amer: 55 mL/min — ABNORMAL LOW (ref >60)
Glucose, Bld: 103 mg/dL — ABNORMAL HIGH (ref 70–99)
Potassium: 2.9 mmol/L — ABNORMAL LOW (ref 3.5–5.1)
Sodium: 139 mmol/L (ref 135–145)

## 2018-03-02 LAB — CBC
HCT: 45.7 % (ref 39.0–52.0)
Hemoglobin: 14.6 g/dL (ref 13.0–17.0)
MCH: 32.2 pg (ref 26.0–34.0)
MCHC: 31.9 g/dL (ref 30.0–36.0)
MCV: 100.9 fL — ABNORMAL HIGH (ref 80.0–100.0)
Platelets: 271 10*3/uL (ref 150–400)
RBC: 4.53 MIL/uL (ref 4.22–5.81)
RDW: 12.7 % (ref 11.5–15.5)
WBC: 6.5 10*3/uL (ref 4.0–10.5)
nRBC: 0 % (ref 0.0–0.2)

## 2018-03-02 LAB — HEPARIN LEVEL (UNFRACTIONATED): Heparin Unfractionated: 0.5 IU/mL (ref 0.30–0.70)

## 2018-03-02 MED ORDER — BISOPROLOL FUMARATE 10 MG PO TABS: 10.0000 mg | ORAL_TABLET | Freq: Every day | ORAL | 11 refills | Status: DC

## 2018-03-02 MED ORDER — RIVAROXABAN 15 MG PO TABS
15.0000 mg | ORAL_TABLET | Freq: Two times a day (BID) | ORAL | Status: DC
  Administered 2018-03-02: 15 mg via ORAL

## 2018-03-02 MED ORDER — RIVAROXABAN 15 MG PO TABS: 15.0000 mg | ORAL_TABLET | Freq: Two times a day (BID) | ORAL | 0 refills | Status: DC

## 2018-03-02 MED ORDER — RIVAROXABAN 20 MG PO TABS: 20.0000 mg | ORAL_TABLET | Freq: Every day | ORAL | Status: DC

## 2018-03-02 MED ORDER — RIVAROXABAN 20 MG PO TABS: 20.0000 mg | ORAL_TABLET | Freq: Every day | ORAL | 3 refills | Status: DC

## 2018-03-02 MED ORDER — RIVAROXABAN 15 MG PO TABS: 15.0000 mg | ORAL_TABLET | Freq: Two times a day (BID) | ORAL | Status: DC

## 2018-03-02 MED ORDER — RIVAROXABAN 15 MG PO TABS
15.0000 mg | ORAL_TABLET | Freq: Two times a day (BID) | ORAL | Status: DC
  Filled 2018-03-02: qty 1

## 2018-03-02 NOTE — Discharge Summary (Signed)
Physician Discharge Summary  Bradley Torres KYH:[id number] DOB: [birth year] DOA: 02/27/2018  PCP: Terald Sleeper, PA-C  Admit date: 02/27/2018 Discharge date: 03/02/2018  Time spent: 35 minutes  Recommendations for Outpatient Follow-up:  1. Repeat CBC to follow hemoglobin trend 2. Repeat basic metabolic panel to follow electrolytes and renal function. 3. Arrange sleep study as an outpatient.   Discharge Diagnoses:  Active Problems:   Benign essential HTN   Obesity, Class III, BMI 40-49.9 (morbid obesity) ([place])   Pulmonary embolism (HCC)   Chronic diastolic CHF (congestive heart failure) (HCC)   Chronic pain syndrome Hypokalemia Chronic kidney disease stage III   Discharge Condition: Stable and improved.  Patient discharged home with instruction to follow-up with PCP in 10 days.  Diet recommendation: Low calorie diet and heart healthy diet  Filed Weights   02/27/18 1334 02/27/18 2130  Weight: (!) 173.7 kg (!) 171 kg    History of present illness:  As per H&P written by Dr. Denton Brick on 02/27/2018 57 y.o. male with medical history significant for morbid obesity BMI 53, HTN, Pre DM, NSTEMI, CKD3, who was sent to the ED by primary care provider.  Patient complained of right lower extremity pain and swelling for about a week which was actually proving, but lower extremity Dopplers were done and were positive for DVT.  Patient reports shortness of breath over the past month since discharge from the hospital, worse with exertion and left-sided chest pain over the past 4 days.  Also associated with chest pain was right arm numbness without weakness that lasted 2 days and resolved. No recent travels within the past 3 months, last trip was 4-hour drive to [place] in June.  No recent surgeries.  Family history of blood clots in maternal grandmother.  Recent admission 01/23/2018-with elevated troponin, transfered to [facility], cardiac cath- no significant coronary artery disease with  essentially normal coronary arteries, and only mild luminal irregularity of the proximal LAD of 10%.    ED Course: Mild tachypnea to 22.  O2 sats greater than 92% on room air.  Heart rate 60s to 70s.  K low 3.1.  Creatinine 1.8, recent cr baseline 1.4-1.6, troponin elevated at 0.19, BNP 309.  CTA chest showed right heart strain consistent with at least submassive PE.  Critical care consulted recommended admission here at [facility] as patient is not requiring oxygen.  Patient was started on IV heparin  Hospital Course:  1-saddle pulmonary embolism with right lower extremity DVT -Currently no having any chest pain denies shortness of breath -Good oxygen saturation on room air and normal respiratory effort -2D echo demonstrated stable right-sided failure with chronic pulmonary hypertension most likely associated with underlying obstructive sleep apnea. -Patient will be discharged on treatment with Xarelto for 6 months. -Patient instructed to monitor for any kind of bleeding and to avoid the use of NSAIDs.  2-hypokalemia -Resolved -Repeat basic metabolic panel follow-up visit to reassess electrolytes and renal function.  3-hypertension -Stable and rising -Resume home antihypertensive agents -Instructed to follow heart healthy/low-sodium diet.  4-chronic kidney disease is stage 2-3 -Appears to be at baseline -Continue home medication regimen and follow renal function at follow-up visit  5-morbid obesity -With concern for obstructive sleep apnea  -Long discussion regarding portion control, low calorie diet and increase physical activity -Patient will need outpatient sleep study.  6-chronic diastolic heart failure -Advised to follow low-sodium diet and to take medication for blood pressure -This is chronic and compensated  7-chronic pain syndrome -Continue outpatient  pain medication.  Procedures:  See below for x-ray reports  2-D echo - Left ventricle: The cavity size was normal. Wall  thickness was   increased in a pattern of mild LVH. Systolic function was normal.   The estimated ejection fraction was in the range of 55% to 60%.   Wall motion was normal; there were no regional wall motion   abnormalities. Left ventricular diastolic function parameters   were normal. - Aortic valve: Mildly calcified annulus. Trileaflet. There was no   stenosis. Mean gradient (S): 8 mm Hg. Valve area (VTI): 2.31   cm^2. - Mitral valve: Mildly calcified annulus. - Right ventricle: The cavity size was moderately dilated. Systolic   function was mildly reduced. - Right atrium: Central venous pressure (est): 3 mm Hg. - Atrial septum: No defect or patent foramen ovale was identified. - Tricuspid valve: There was mild regurgitation. - Pulmonary arteries: Systolic pressure was severely increased. PA   peak pressure: 87 mm Hg (S). - Pericardium, extracardiac: There was no pericardial effusion.  Consultations:  None  Discharge Exam: Vitals:   03/02/18 0625 03/02/18 1442  BP: 125/79 (!) 144/94  Pulse: 66 78  Resp: 20 18  Temp: (!) 97.4 F (36.3 C) 98 F (36.7 C)  SpO2: 94% 96%    General: Afebrile, no chest pain, speaking in full sentences and no requiring oxygen supplementation. Cardiovascular: S1 and S2, no rubs, no gallops.  Unable to properly assess JVD due to body habitus. Respiratory: No wheezing, no crackles, positive scattered rhonchi; normal respiratory effort. Abdomen: Obese, no tenderness, positive bowel sounds, no distention. Extremities: 1+ edema on his right lower extremity and left lower extremity with trace edema.  Multiple scabs lesions; no cyanosis or clubbing.  Patient denies tenderness on palpation.  No erythema.  Discharge Instructions   Discharge Instructions    (HEART FAILURE PATIENTS) Call MD:  Anytime you have any of the following symptoms: 1) 3 pound weight gain in 24 hours or 5 pounds in 1 week 2) shortness of breath, with or without a dry hacking cough  3) swelling in the hands, feet or stomach 4) if you have to sleep on extra pillows at night in order to breathe.   Complete by:  As directed    Diet - low sodium heart healthy   Complete by:  As directed    Discharge instructions   Complete by:  As directed    Maintain adequate hydration Follow heart healthy and low-calorie diet (sodium intake less than 2.5 g/day) Arrange follow-up with PCP in 10 days Avoid the use of NSAIDs as discussed and monitor for any signs of bleeding (urine and/or in your stools)     Allergies as of 03/02/2018      Reactions   Ibuprofen Other (See Comments)   Rectal bleed   Diflucan [fluconazole] Rash   Losartan Rash      Medication List    STOP taking these medications   aspirin EC 325 MG tablet   bisoprolol-hydrochlorothiazide 10-6.25 MG tablet Commonly known as:  ZIAC     TAKE these medications   bisoprolol 10 MG tablet Commonly known as:  ZEBETA Take 1 tablet (10 mg total) by mouth daily.   cyclobenzaprine 10 MG tablet Commonly known as:  FLEXERIL TAKE 1 TABLET BY MOUTH THREE TIMES DAILY AS NEEDED What changed:  reasons to take this   lisinopril-hydrochlorothiazide 20-25 MG tablet Commonly known as:  PRINZIDE,ZESTORETIC Take 1 tablet by mouth daily.   oxyCODONE-acetaminophen 10-325 MG  tablet Commonly known as:  PERCOCET Take 1 tablet by mouth every 8 (eight) hours as needed for pain. What changed:  Another medication with the same name was removed. Continue taking this medication, and follow the directions you see here.   Rivaroxaban 15 MG Tabs tablet Commonly known as:  XARELTO Take 1 tablet (15 mg total) by mouth 2 (two) times daily with a meal. Start taking on:  03/03/2018   rivaroxaban 20 MG Tabs tablet Commonly known as:  XARELTO Take 1 tablet (20 mg total) by mouth daily with supper. Start taking on:  03/24/2018   terbinafine 1 % cream Commonly known as:  LAMISIL Apply 1 application topically 2 (two) times daily as needed  (FOR ITCHY SKIN.).      Allergies  Allergen Reactions  . Ibuprofen Other (See Comments)    Rectal bleed  . Diflucan [Fluconazole] Rash  . Losartan Rash   Follow-up Information    Terald Sleeper, PA-C. Schedule an appointment as soon as possible for a visit in 10 day(s).   Specialties:  Physician Assistant, Family Medicine Contact information: [place]  [id number] [phone]           The results of significant diagnostics from this hospitalization (including imaging, microbiology, ancillary and laboratory) are listed below for reference.    Significant Diagnostic Studies: Ct Angio Chest Pe W/cm &/or Wo Cm  Result Date: 02/27/2018 CLINICAL DATA:  Chest pain with right leg DVT. EXAM: CT ANGIOGRAPHY CHEST WITH CONTRAST TECHNIQUE: Multidetector CT imaging of the chest was performed using the standard protocol during bolus administration of intravenous contrast. Multiplanar CT image reconstructions and MIPs were obtained to evaluate the vascular anatomy. CONTRAST:  30mL ISOVUE-370 IOPAMIDOL (ISOVUE-370) INJECTION 76% COMPARISON:  09/09/2012 chest CT report, CXR 01/19/2018 FINDINGS: Cardiovascular: Saddle pulmonary embolus extending into the bilateral lobar and proximal segmental arteries are identified. RV/LV ratio equals 0.91 consistent with right heart strain. Normal heart size without pericardial effusion. Nonaneurysmal thoracic aorta without dissection or aneurysm. Unremarkable great vessels. Mediastinum/Nodes: No enlarged mediastinal, hilar, or axillary lymph nodes. Thyroid gland, trachea, and esophagus demonstrate no significant findings. Lungs/Pleura: Minimal upper lobe pulmonary blebs. No pulmonary consolidation, dominant mass, effusion or pneumothorax. Upper Abdomen: Partially included cyst off the upper pole of the right kidney measuring up to 2.1 cm. No adrenal mass. No acute upper abdominal abnormality. Musculoskeletal: Degenerative disc disease midthoracic spine.  No acute nor suspicious osseous abnormalities. Review of the MIP images confirms the above findings. IMPRESSION: Positive for acute PE with CT evidence of right heart strain (RV/LV Ratio = 0.91) consistent with at least submassive (intermediate risk) PE. The presence of right heart strain has been associated with an increased risk of morbidity and mortality. Please activate Code PE by paging [phone]. These results were called by telephone at the time of interpretation on 02/27/2018 at 5:11 pm to Dr. Carmin Muskrat , who verbally acknowledged these results. Emphysema (ICD10-J43.9). Electronically Signed   By: Ashley Royalty M.D.   On: 02/27/2018 17:11   US Venous Img Lower Unilateral Right  Result Date: 02/27/2018 CLINICAL DATA:  57 year old with right calf pain and edema. EXAM: RIGHT LOWER EXTREMITY VENOUS DOPPLER ULTRASOUND TECHNIQUE: Gray-scale sonography with graded compression, as well as color Doppler and duplex ultrasound were performed to evaluate the lower extremity deep venous systems from the level of the common femoral vein and including the common femoral, femoral, profunda femoral, popliteal and calf veins including the posterior tibial, peroneal and gastrocnemius veins when  visible. The superficial great saphenous vein was also interrogated. Spectral Doppler was utilized to evaluate flow at rest and with distal augmentation maneuvers in the common femoral, femoral and popliteal veins. COMPARISON:  None. FINDINGS: Contralateral Common Femoral Vein: Respiratory phasicity is normal and symmetric with the symptomatic side. No evidence of thrombus. Normal compressibility. Common Femoral Vein: No evidence of thrombus. Normal compressibility, respiratory phasicity and response to augmentation. Saphenofemoral Junction: No evidence of thrombus. Normal compressibility and flow on color Doppler imaging. Profunda Femoral Vein: No evidence of thrombus. Normal compressibility and flow on color Doppler imaging.  Femoral Vein: Positive for thrombus. Proximal, mid and distal femoral vein is not compressible. There is echogenic thrombus throughout the femoral vein. Popliteal Vein: Positive for thrombus. Occlusive thrombus in the popliteal vein. Calf Veins: Limited evaluation of the deep calf veins but there is concern for thrombus involving the posterior tibial veins and peroneal veins. Other Findings: Right great saphenous vein appears to be compressible and patent. IMPRESSION: Positive for deep venous thrombosis in the right lower extremity. Thrombus involving the right femoral vein and right popliteal vein. Probable thrombus in right deep calf veins. Electronically Signed   By: Markus Daft M.D.   On: 02/27/2018 13:01    Microbiology: Recent Results (from the past 240 hour(s))  MRSA PCR Screening     Status: None   Collection Time: 02/27/18  8:55 PM  Result Value Ref Range Status   MRSA by PCR NEGATIVE NEGATIVE Final    Comment:        The GeneXpert MRSA Assay (FDA approved for NASAL specimens only), is one component of a comprehensive MRSA colonization surveillance program. It is not intended to diagnose MRSA infection nor to guide or monitor treatment for MRSA infections. Performed at [facility], [address] [place], [place] [address]      Labs: Basic Metabolic Panel: Recent Labs  Lab 02/27/18 1440 02/28/18 0724 03/01/18 0438 03/02/18 0508  NA 138 139 140 139  K 3.1* 3.9 3.5 2.9*  CL 101 103 103 102  CO2 26 28 29 25   GLUCOSE 135* 119* 102* 103*  BUN 26* 25* 21* 16  CREATININE 1.86* 1.66* 1.49* 1.42*  CALCIUM 9.0 8.6* 8.8* 9.0  MG 2.1  --   --   --    CBC: Recent Labs  Lab 02/27/18 1440 02/28/18 0724 03/01/18 0438 03/02/18 0508  WBC 11.2* 7.2 6.6 6.5  NEUTROABS 9.1*  --   --   --   HGB 14.3 13.3 12.8* 14.6  HCT 44.2 41.9 40.2 45.7  MCV 95.9 98.4 98.3 100.9*  PLT 278 236 228 271   Cardiac Enzymes: Recent Labs  Lab 02/27/18 1440  TROPONINI 0.19*   BNP: BNP  (last 3 results) Recent Labs    02/27/18 1440  BNP 309.0*    Signed:  Barton Dubois MD.  [facility] 03/02/2018, 4:24 PM

## 2018-03-02 NOTE — Progress Notes (Addendum)
ANTICOAGULATION CONSULT NOTE - follow up [place] for Heparin transition to Xarelto Indication: pulmonary embolus  Allergies  Allergen Reactions  . Ibuprofen Other (See Comments)    Rectal bleed  . Diflucan [Fluconazole] Rash  . Losartan Rash    Patient Measurements: Height: 5\' 11"  (180.3 cm) Weight: (!) 376 lb 15.8 oz (171 kg) IBW/kg (Calculated) : 75.3 HEPARIN DW (KG): 117.2  Vital Signs: Temp: 97.4 F (36.3 C) (12/06 0625) Temp Source: Oral (12/06 0625) BP: 125/79 (12/06 0625) Pulse Rate: 66 (12/06 0625)  Labs: Recent Labs    02/27/18 1440  02/28/18 0724 02/28/18 1543 03/01/18 0438 03/02/18 0508  HGB 14.3  --  13.3  --  12.8* 14.6  HCT 44.2  --  41.9  --  40.2 45.7  PLT 278  --  236  --  228 271  APTT 29  --   --   --   --   --   HEPARINUNFRC  --    < > 0.47 0.44 0.53 0.50  CREATININE 1.86*  --  1.66*  --  1.49* 1.42*  TROPONINI 0.19*  --   --   --   --   --    < > = values in this interval not displayed.    Estimated Creatinine Clearance: 93.3 mL/min (A) (by C-G formula based on SCr of 1.42 mg/dL (H)).     Assessment: Patient with recent cardiac cath presents to ED with SOB, fatigue and outpatient study concerning for DVT. CT angiogram done and positive for PE.   Heparin level this morning is therapeutic at 0.5IU/mL.  Patient to dc home today on Xarelto.   Goal of Therapy:  Heparin level 0.3-0.7 units/ml Monitor platelets by anticoagulation protocol: Yes   Plan:  DC heparin infusion   Start Xarelto 15mg  bid x21 days last dose on (12-27 at 0800) Start xarelto 20mg  daily with supper starting  on 03/24/18. Continue to monitor H&H and platelets  Despina Pole, Pharm. D. Clinical Pharmacist 03/02/2018 9:04 AM

## 2018-03-02 NOTE — Progress Notes (Signed)
Removed IV-clean, dry, intact. Reviewed d/c paperwork with patient. Gave him his preprinted prescriptions. Answered all questions. Bradley Torres wheeled stable patient and belongings to main entrance where he was picked up by his brother.

## 2018-03-05 ENCOUNTER — Telehealth: Payer: Self-pay | Admitting: *Deleted

## 2018-03-05 ENCOUNTER — Other Ambulatory Visit: Payer: Self-pay | Admitting: Physician Assistant

## 2018-03-05 MED ORDER — CARVEDILOL PHOSPHATE ER 10 MG PO CP24: 10.0000 mg | ORAL_CAPSULE | Freq: Every day | ORAL | 2 refills | Status: DC

## 2018-03-05 NOTE — Telephone Encounter (Signed)
No, I will send another beta blocker (like bisoprolol). Lisinopril is a ACE inhibitor. Sending now.

## 2018-03-05 NOTE — Telephone Encounter (Signed)
Spoke with the pharmacist and advised of provider feedback.

## 2018-03-06 ENCOUNTER — Encounter: Payer: Self-pay | Admitting: Physician Assistant

## 2018-03-06 ENCOUNTER — Ambulatory Visit: Payer: Medicaid Other | Admitting: Physician Assistant

## 2018-03-06 ENCOUNTER — Other Ambulatory Visit: Payer: Self-pay | Admitting: Physician Assistant

## 2018-03-06 ENCOUNTER — Telehealth: Payer: Self-pay

## 2018-03-06 VITALS — BP 132/87 | HR 84 | Temp 97.8°F | Ht 71.0 in | Wt 392.3 lb

## 2018-03-06 DIAGNOSIS — Z7901 Long term (current) use of anticoagulants: Secondary | ICD-10-CM | POA: Diagnosis not present

## 2018-03-06 DIAGNOSIS — R0602 Shortness of breath: Secondary | ICD-10-CM

## 2018-03-06 DIAGNOSIS — R0681 Apnea, not elsewhere classified: Secondary | ICD-10-CM | POA: Diagnosis not present

## 2018-03-06 DIAGNOSIS — I2692 Saddle embolus of pulmonary artery without acute cor pulmonale: Secondary | ICD-10-CM | POA: Diagnosis not present

## 2018-03-06 DIAGNOSIS — I499 Cardiac arrhythmia, unspecified: Secondary | ICD-10-CM | POA: Diagnosis not present

## 2018-03-06 DIAGNOSIS — R0683 Snoring: Secondary | ICD-10-CM

## 2018-03-06 DIAGNOSIS — I252 Old myocardial infarction: Secondary | ICD-10-CM | POA: Diagnosis not present

## 2018-03-06 DIAGNOSIS — R5382 Chronic fatigue, unspecified: Secondary | ICD-10-CM | POA: Diagnosis not present

## 2018-03-06 DIAGNOSIS — I2782 Chronic pulmonary embolism: Secondary | ICD-10-CM

## 2018-03-06 MED ORDER — METOPROLOL SUCCINATE ER 50 MG PO TB24: 50.0000 mg | ORAL_TABLET | Freq: Every day | ORAL | 3 refills | Status: DC

## 2018-03-06 NOTE — Telephone Encounter (Signed)
Medicaid non preferred Carvedilol ER capsules   Preferred are Carvedilol tablet, Atenolol tab., Labetasol tab., Metoprolol succinate XL tab., Propranolol tab., Sorinc tab., Sotalol tab.

## 2018-03-06 NOTE — Telephone Encounter (Signed)
Sent metoprolol succinate XL to pharmacy

## 2018-03-06 NOTE — Progress Notes (Signed)
BP 132/87   Pulse 84   Temp 97.8 F (36.6 C) (Oral)   Ht 5\' 11"  (1.803 m)   Wt (!) 392 lb 4.8 oz (177.9 kg)   SpO2 94%   BMI 54.71 kg/m    Subjective:    Patient ID: Bradley Torres, male    DOB: [birth year], 57 y.o.   MRN: [id number]  HPI: Bradley Torres is a 57 y.o. male presenting on 03/06/2018 for Hospitalization Follow-up  This patient comes in for hospital follow-up where he was found to have a DVT in the saddle pulmonary embolism.  He states he is feeling some better.  He is not nearly as short of breath.  He has been taking his Xarelto.  He is still on 15 mg twice daily.  We have had some difficulty with him getting some of his blood pressure medicine refilled.  His insurance is not covering bisoprolol or carvedilol.  So I switched it to metoprolol succinate.  It seems that this has gone through.  He does need referrals for sleep evaluation, pulmonary, cardiology follow-up.  These will be placed.  He states he does not need any other refills at this time.  Past Medical History:  Diagnosis Date  . Chronic back pain   . DVT (deep venous thrombosis) ([place])   . Essential hypertension   . Morbid obesity ([place])   . Panic disorder   . Prediabetes   . Shoulder pain, left    Following fall   Relevant past medical, surgical, family and social history reviewed and updated as indicated. Interim medical history since our last visit reviewed. Allergies and medications reviewed and updated. DATA REVIEWED: CHART IN EPIC  Family History reviewed for pertinent findings.  Review of Systems  Constitutional: Positive for fatigue. Negative for appetite change and fever.  HENT: Negative.   Eyes: Negative.  Negative for pain and visual disturbance.  Respiratory: Positive for apnea, cough and shortness of breath. Negative for choking, chest tightness and wheezing.   Cardiovascular: Positive for palpitations. Negative for chest pain and leg swelling.  Gastrointestinal: Negative.  Negative  for abdominal pain, diarrhea, nausea and vomiting.  Endocrine: Negative.   Genitourinary: Negative.   Musculoskeletal: Negative.   Skin: Negative.  Negative for color change, pallor, rash and wound.  Neurological: Positive for weakness. Negative for dizziness, numbness and headaches.  Psychiatric/Behavioral: Negative.     Allergies as of 03/06/2018      Reactions   Ibuprofen Other (See Comments)   Rectal bleed   Diflucan [fluconazole] Rash   Losartan Rash      Medication List        Accurate as of 03/06/18  3:47 PM. Always use your most recent med list.          cyclobenzaprine 10 MG tablet Commonly known as:  FLEXERIL TAKE 1 TABLET BY MOUTH THREE TIMES DAILY AS NEEDED   lisinopril-hydrochlorothiazide 20-25 MG tablet Commonly known as:  PRINZIDE,ZESTORETIC Take 1 tablet by mouth daily.   metoprolol succinate 50 MG 24 hr tablet Commonly known as:  TOPROL-XL Take 1 tablet (50 mg total) by mouth daily. Take with or immediately following a meal.   oxyCODONE-acetaminophen 10-325 MG tablet Commonly known as:  PERCOCET Take 1 tablet by mouth every 8 (eight) hours as needed for pain.   Rivaroxaban 15 MG Tabs tablet Commonly known as:  XARELTO Take 1 tablet (15 mg total) by mouth 2 (two) times daily with a meal.   rivaroxaban 20 MG Tabs  tablet Commonly known as:  XARELTO Take 1 tablet (20 mg total) by mouth daily with supper. Start taking on:  03/24/2018   terbinafine 1 % cream Commonly known as:  LAMISIL Apply 1 application topically 2 (two) times daily as needed (FOR ITCHY SKIN.).          Objective:    BP 132/87   Pulse 84   Temp 97.8 F (36.6 C) (Oral)   Ht 5\' 11"  (1.803 m)   Wt (!) 392 lb 4.8 oz (177.9 kg)   SpO2 94%   BMI 54.71 kg/m   Allergies  Allergen Reactions  . Ibuprofen Other (See Comments)    Rectal bleed  . Diflucan [Fluconazole] Rash  . Losartan Rash    Wt Readings from Last 3 Encounters:  03/06/18 (!) 392 lb 4.8 oz (177.9 kg)    02/27/18 (!) 376 lb 15.8 oz (171 kg)  02/27/18 (!) 384 lb 6.4 oz (174.4 kg)    Physical Exam  Constitutional: He appears well-developed and well-nourished. No distress.  HENT:  Head: Normocephalic and atraumatic.  Eyes: Pupils are equal, round, and reactive to light. Conjunctivae and EOM are normal.  Neck: Normal range of motion. Neck supple.  Cardiovascular: Normal rate and normal heart sounds. An irregularly irregular rhythm present.  Pulmonary/Chest: Effort normal and breath sounds normal.  Abdominal: Soft. Bowel sounds are normal.  Musculoskeletal: Normal range of motion.  Skin: Skin is warm and dry. He is not diaphoretic.    Results for orders placed or performed during the hospital encounter of 02/27/18  MRSA PCR Screening  Result Value Ref Range   MRSA by PCR NEGATIVE NEGATIVE  Basic metabolic panel  Result Value Ref Range   Sodium 138 135 - 145 mmol/L   Potassium 3.1 (L) 3.5 - 5.1 mmol/L   Chloride 101 98 - 111 mmol/L   CO2 26 22 - 32 mmol/L   Glucose, Bld 135 (H) 70 - 99 mg/dL   BUN 26 (H) 6 - 20 mg/dL   Creatinine, Ser 1.86 (H) 0.61 - 1.24 mg/dL   Calcium 9.0 8.9 - 10.3 mg/dL   GFR calc non Af Amer 40 (L) >60 mL/min   GFR calc Af Amer 46 (L) >60 mL/min   Anion gap 11 5 - 15  CBC with Differential  Result Value Ref Range   WBC 11.2 (H) 4.0 - 10.5 K/uL   RBC 4.61 4.22 - 5.81 MIL/uL   Hemoglobin 14.3 13.0 - 17.0 g/dL   HCT 44.2 39.0 - 52.0 %   MCV 95.9 80.0 - 100.0 fL   MCH 31.0 26.0 - 34.0 pg   MCHC 32.4 30.0 - 36.0 g/dL   RDW 12.8 11.5 - 15.5 %   Platelets 278 150 - 400 K/uL   nRBC 0.0 0.0 - 0.2 %   Neutrophils Relative % 80 %   Neutro Abs 9.1 (H) 1.7 - 7.7 K/uL   Lymphocytes Relative 9 %   Lymphs Abs 1.0 0.7 - 4.0 K/uL   Monocytes Relative 8 %   Monocytes Absolute 0.9 0.1 - 1.0 K/uL   Eosinophils Relative 1 %   Eosinophils Absolute 0.1 0.0 - 0.5 K/uL   Basophils Relative 1 %   Basophils Absolute 0.1 0.0 - 0.1 K/uL   Immature Granulocytes 1 %   Abs  Immature Granulocytes 0.07 0.00 - 0.07 K/uL  Troponin I - Once  Result Value Ref Range   Troponin I 0.19 (HH) <0.03 ng/mL  Brain natriuretic peptide  Result Value  Ref Range   B Natriuretic Peptide 309.0 (H) 0.0 - 100.0 pg/mL  CBC  Result Value Ref Range   WBC 7.2 4.0 - 10.5 K/uL   RBC 4.26 4.22 - 5.81 MIL/uL   Hemoglobin 13.3 13.0 - 17.0 g/dL   HCT 41.9 39.0 - 52.0 %   MCV 98.4 80.0 - 100.0 fL   MCH 31.2 26.0 - 34.0 pg   MCHC 31.7 30.0 - 36.0 g/dL   RDW 13.0 11.5 - 15.5 %   Platelets 236 150 - 400 K/uL   nRBC 0.0 0.0 - 0.2 %  Heparin level (unfractionated)  Result Value Ref Range   Heparin Unfractionated 0.77 (H) 0.30 - 0.70 IU/mL  APTT  Result Value Ref Range   aPTT 29 24 - 36 seconds  Basic metabolic panel  Result Value Ref Range   Sodium 139 135 - 145 mmol/L   Potassium 3.9 3.5 - 5.1 mmol/L   Chloride 103 98 - 111 mmol/L   CO2 28 22 - 32 mmol/L   Glucose, Bld 119 (H) 70 - 99 mg/dL   BUN 25 (H) 6 - 20 mg/dL   Creatinine, Ser 1.66 (H) 0.61 - 1.24 mg/dL   Calcium 8.6 (L) 8.9 - 10.3 mg/dL   GFR calc non Af Amer 45 (L) >60 mL/min   GFR calc Af Amer 53 (L) >60 mL/min   Anion gap 8 5 - 15  Magnesium  Result Value Ref Range   Magnesium 2.1 1.7 - 2.4 mg/dL  Heparin level (unfractionated)  Result Value Ref Range   Heparin Unfractionated 0.47 0.30 - 0.70 IU/mL  Heparin level (unfractionated)  Result Value Ref Range   Heparin Unfractionated 0.44 0.30 - 0.70 IU/mL  CBC  Result Value Ref Range   WBC 6.6 4.0 - 10.5 K/uL   RBC 4.09 (L) 4.22 - 5.81 MIL/uL   Hemoglobin 12.8 (L) 13.0 - 17.0 g/dL   HCT 40.2 39.0 - 52.0 %   MCV 98.3 80.0 - 100.0 fL   MCH 31.3 26.0 - 34.0 pg   MCHC 31.8 30.0 - 36.0 g/dL   RDW 12.8 11.5 - 15.5 %   Platelets 228 150 - 400 K/uL   nRBC 0.0 0.0 - 0.2 %  Heparin level (unfractionated)  Result Value Ref Range   Heparin Unfractionated 0.53 0.30 - 0.70 IU/mL  Basic metabolic panel  Result Value Ref Range   Sodium 140 135 - 145 mmol/L   Potassium  3.5 3.5 - 5.1 mmol/L   Chloride 103 98 - 111 mmol/L   CO2 29 22 - 32 mmol/L   Glucose, Bld 102 (H) 70 - 99 mg/dL   BUN 21 (H) 6 - 20 mg/dL   Creatinine, Ser 1.49 (H) 0.61 - 1.24 mg/dL   Calcium 8.8 (L) 8.9 - 10.3 mg/dL   GFR calc non Af Amer 52 (L) >60 mL/min   GFR calc Af Amer 60 (L) >60 mL/min   Anion gap 8 5 - 15  CBC  Result Value Ref Range   WBC 6.5 4.0 - 10.5 K/uL   RBC 4.53 4.22 - 5.81 MIL/uL   Hemoglobin 14.6 13.0 - 17.0 g/dL   HCT 45.7 39.0 - 52.0 %   MCV 100.9 (H) 80.0 - 100.0 fL   MCH 32.2 26.0 - 34.0 pg   MCHC 31.9 30.0 - 36.0 g/dL   RDW 12.7 11.5 - 15.5 %   Platelets 271 150 - 400 K/uL   nRBC 0.0 0.0 - 0.2 %  Heparin level (  unfractionated)  Result Value Ref Range   Heparin Unfractionated 0.50 0.30 - 0.70 IU/mL  Basic metabolic panel  Result Value Ref Range   Sodium 139 135 - 145 mmol/L   Potassium 2.9 (L) 3.5 - 5.1 mmol/L   Chloride 102 98 - 111 mmol/L   CO2 25 22 - 32 mmol/L   Glucose, Bld 103 (H) 70 - 99 mg/dL   BUN 16 6 - 20 mg/dL   Creatinine, Ser 1.42 (H) 0.61 - 1.24 mg/dL   Calcium 9.0 8.9 - 10.3 mg/dL   GFR calc non Af Amer 55 (L) >60 mL/min   GFR calc Af Amer >60 >60 mL/min   Anion gap 12 5 - 15  ECHOCARDIOGRAM COMPLETE  Result Value Ref Range   Weight 6,031.79 oz   Height 71 in   BP 124/72 mmHg      Assessment & Plan:   1. Apnea - Ambulatory referral to Sleep Studies  2. Snoring - Ambulatory referral to Sleep Studies  3. Chronic fatigue - Ambulatory referral to Sleep Studies  4. SOB (shortness of breath) - Ambulatory referral to Pulmonology  5. 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 Anticoagulated by anticoagulation treatment - Ambulatory referral to Cardiology  7. Hx of non-ST elevation myocardial infarction (NSTEMI) - Ambulatory referral to Cardiology  8. Shortness of breath - Ambulatory referral to Pulmonology  9. Irregular heart beat - Ambulatory  referral to Cardiology   Continue all other maintenance medications as listed above.  Follow up plan: No follow-ups on file.  Educational handout given for [place] PA-C [place] [address]  [place], [place] [address] [phone]   03/06/2018, 3:47 PM

## 2018-03-06 NOTE — Patient Instructions (Signed)
In a few days you may receive a survey in the mail or online from [employer] regarding your visit with us today. Please take a moment to fill this out. Your feedback is very important to our whole office. It can help us better understand your needs as well as improve your experience and satisfaction. Thank you for taking your time to complete it. We care about you.  Zaiden Ludlum, PA-C  

## 2018-03-15 ENCOUNTER — Ambulatory Visit: Payer: Medicaid Other | Admitting: Student

## 2018-03-15 ENCOUNTER — Encounter: Payer: Self-pay | Admitting: Student

## 2018-03-15 VITALS — BP 126/68 | HR 87 | Ht 71.0 in | Wt 383.0 lb

## 2018-03-15 DIAGNOSIS — I10 Essential (primary) hypertension: Secondary | ICD-10-CM

## 2018-03-15 DIAGNOSIS — I2584 Coronary atherosclerosis due to calcified coronary lesion: Secondary | ICD-10-CM | POA: Diagnosis not present

## 2018-03-15 DIAGNOSIS — I2692 Saddle embolus of pulmonary artery without acute cor pulmonale: Secondary | ICD-10-CM

## 2018-03-15 DIAGNOSIS — R002 Palpitations: Secondary | ICD-10-CM

## 2018-03-15 DIAGNOSIS — I2510 Atherosclerotic heart disease of native coronary artery without angina pectoris: Secondary | ICD-10-CM | POA: Diagnosis not present

## 2018-03-15 DIAGNOSIS — I272 Pulmonary hypertension, unspecified: Secondary | ICD-10-CM

## 2018-03-15 NOTE — Progress Notes (Signed)
Cardiology Office Note    Date:  03/15/2018   ID:  Bradley Torres, DOB 01-Jan-1961, MRN [id number]  PCP:  Terald Sleeper, PA-C  Cardiologist: Kate Sable, MD    Chief Complaint  Patient presents with  . Hospitalization Follow-up    History of Present Illness:    Bradley Torres is a 56 y.o. male with past medical history of HTN, HLD, and morbid obesity who presents to the office today for hospital follow-up.  He was recently admitted to [facility] from 01/19/2018 to 01/23/2018 for evaluation of worsening dyspnea on exertion and abdominal pain. Lactic acid was found to be elevated to 3.9 and troponin values peaked at 0.33. His EKG tracing showed diffuse TWI along the inferior and precordial leads. Echocardiogram showed a preserved EF of 60 to 65% with no regional wall motion abnormalities. Was noted to have moderate TR and PA pressure elevated to 64 mmHg, consistent with moderate pulmonary hypertension. A cardiac catheterization was performed and showed 10% Proximal-LAD stenosis with no significant obstructive disease and LVEDP was mildly elevated at 18 mm Hg. Continued medical therapy was recommended with plans for an outpatient sleep study.   Since hospital discharge, he was evaluated by his PCP on 02/27/2018 for right calf pain and lower extremity dopplers were obtained and positive for a DVT involving the right femoral vein and right popliteal vein. Was advised to go to the ED for further evaluation and CTA was obtained and positive for an acute saddle PE with evidence of right heart strain. He was admitted for further evaluation and started on IV Heparin for anticoagulation. Heparin was transitioned to Xarelto prior to discharge and ASA was discontinued.   In talking with the patient today, he reports significant improvement in his dyspnea since being started on anticoagulation. He denies any recurrent chest pain since hospital discharge. No recent orthopnea, PND, or lower extremity  edema. He does report occasional palpitations but reports his symptoms have improved since being started on Toprol-XL by his PCP. Was noted to have occasional PVC's by review of telemetry during his recent admission.  He reports good compliance with his current medication regimen including Xarelto. Denies missing any recent doses. He has not experienced any recent melena, hematochezia, or hematuria.  Past Medical History:  Diagnosis Date  . CAD (coronary artery disease)    a. 12/2017: cath showing 10% Proximal-LAD stenosis with no significant obstructive disease and LVEDP mildly elevated at 18 mm Hg.   Marland Kitchen Chronic back pain   . DVT (deep venous thrombosis) ([place])   . Essential hypertension   . Morbid obesity ([place])   . Panic disorder   . Prediabetes   . Shoulder pain, left    Following fall    Past Surgical History:  Procedure Laterality Date  . Ankle fracture sugery Left   . COLONOSCOPY WITH PROPOFOL N/A 05/29/2017   Procedure: COLONOSCOPY WITH PROPOFOL;  Surgeon: Daneil Dolin, MD;  [facility];  Service: Endoscopy;  Laterality: N/A;  2:00pm  . LEFT HEART CATH AND CORONARY ANGIOGRAPHY N/A 01/23/2018   Procedure: LEFT HEART CATH AND CORONARY ANGIOGRAPHY;  Surgeon: Troy Sine, MD;  Location: [facility];  Service: Cardiovascular;  Laterality: N/A;  . LUMBAR SPINE SURGERY     fusion  . POLYPECTOMY  05/29/2017   Procedure: POLYPECTOMY;  Surgeon: Daneil Dolin, MD;  Location: [facility];  Service: Endoscopy;;  ascending colon polyp x5-cs transverse colon polyp x4- cs descending colon  polyp x1- hs sigmoid colon polyp x1 -hs rectal polyp x1- hs  . TONSILLECTOMY      Current Medications: Outpatient Medications Prior to Visit  Medication Sig Dispense Refill  . cyclobenzaprine (FLEXERIL) 10 MG tablet TAKE 1 TABLET BY MOUTH THREE TIMES DAILY AS NEEDED (Patient taking differently: Take 10 mg by mouth 3 (three) times daily as needed for muscle spasms. ) 90 tablet 0    . lisinopril-hydrochlorothiazide (PRINZIDE,ZESTORETIC) 20-25 MG tablet Take 1 tablet by mouth daily. 90 tablet 3  . metoprolol succinate (TOPROL-XL) 50 MG 24 hr tablet Take 1 tablet (50 mg total) by mouth daily. Take with or immediately following a meal. 90 tablet 3  . oxyCODONE-acetaminophen (PERCOCET) 10-325 MG tablet Take 1 tablet by mouth every 8 (eight) hours as needed for pain. 90 tablet 0  . Rivaroxaban (XARELTO) 15 MG TABS tablet Take 1 tablet (15 mg total) by mouth 2 (two) times daily with a meal. 42 tablet 0  . [START ON 03/24/2018] rivaroxaban (XARELTO) 20 MG TABS tablet Take 1 tablet (20 mg total) by mouth daily with supper. 30 tablet 3  . terbinafine (LAMISIL) 1 % cream Apply 1 application topically 2 (two) times daily as needed (FOR ITCHY SKIN.). 30 g 5   No facility-administered medications prior to visit.      Allergies:   Ibuprofen; Diflucan [fluconazole]; and Losartan   Social History   Socioeconomic History  . Marital status: Single    Spouse name: Not on file  . Number of children: Not on file  . Years of education: Not on file  . Highest education level: Not on file  Occupational History  . Not on file  Social Needs  . Financial resource strain: Not on file  . Food insecurity:    Worry: Not on file    Inability: Not on file  . Transportation needs:    Medical: Not on file    Non-medical: Not on file  Tobacco Use  . Smoking status: Never Smoker  . Smokeless tobacco: Never Used  Substance and Sexual Activity  . Alcohol use: No  . Drug use: Yes    Types: Marijuana    Comment: occassional  . Sexual activity: Not Currently    Birth control/protection: None  Lifestyle  . Physical activity:    Days per week: Not on file    Minutes per session: Not on file  . Stress: Not on file  Relationships  . Social connections:    Talks on phone: Not on file    Gets together: Not on file    Attends religious service: Not on file    Active member of club or  organization: Not on file    Attends meetings of clubs or organizations: Not on file    Relationship status: Not on file  Other Topics Concern  . Not on file  Social History Narrative  . Not on file     Family History:  The patient's family history includes Colon cancer in his mother; Heart attack in his father and paternal grandfather; Hepatitis (age of onset: 81) in his sister.   Review of Systems:   Please see the history of present illness.     General:  No chills, fever, night sweats or weight changes.  Cardiovascular:  No chest pain, dyspnea on exertion, edema, orthopnea, palpitations, paroxysmal nocturnal dyspnea. Positive for dyspnea on exertion (improving) and palpitations.  Dermatological: No rash, lesions/masses Respiratory: No cough, dyspnea Urologic: No hematuria, dysuria Abdominal:  No nausea, vomiting, diarrhea, bright red blood per rectum, melena, or hematemesis Neurologic:  No visual changes, wkns, changes in mental status. All other systems reviewed and are otherwise negative except as noted above.   Physical Exam:    VS:  BP 126/68 (BP Location: Left Arm)   Pulse 87   Ht 5\' 11"  (1.803 m)   Wt (!) 383 lb (173.7 kg)   SpO2 95%   BMI 53.42 kg/m    General: Well developed, morbidly obese Caucasian male appearing in no acute distress. Head: Normocephalic, atraumatic, sclera non-icteric, no xanthomas, nares are without discharge.  Neck: No carotid bruits. JVD not elevated.  Lungs: Respirations regular and unlabored, without wheezes or rales.  Heart: Regular rate and rhythm. No S3 or S4.  No murmur, no rubs, or gallops appreciated. Abdomen: Soft, non-tender, non-distended with normoactive bowel sounds. No hepatomegaly. No rebound/guarding. No obvious abdominal masses. Msk:  Strength and tone appear normal for age. No joint deformities or effusions. Extremities: No clubbing or cyanosis. Trace ankle edema bilaterally.  Distal pedal pulses are 2+ bilaterally. Neuro:  Alert and oriented X 3. Moves all extremities spontaneously. No focal deficits noted. Psych:  Responds to questions appropriately with a normal affect. Skin: No rashes or lesions noted  Wt Readings from Last 3 Encounters:  03/15/18 (!) 383 lb (173.7 kg)  03/06/18 (!) 392 lb 4.8 oz (177.9 kg)  02/27/18 (!) 376 lb 15.8 oz (171 kg)     Studies/Labs Reviewed:   EKG:  EKG is not ordered today.   Recent Labs: 02/01/2018: ALT 34 02/27/2018: B Natriuretic Peptide 309.0; Magnesium 2.1 03/02/2018: BUN 16; Creatinine, Ser 1.42; Hemoglobin 14.6; Platelets 271; Potassium 2.9; Sodium 139   Lipid Panel    Component Value Date/Time   CHOL 134 02/01/2018 1202   TRIG 99 02/01/2018 1202   HDL 35 (L) 02/01/2018 1202   CHOLHDL 3.8 02/01/2018 1202   CHOLHDL 5.1 01/20/2018 0209   VLDL 26 01/20/2018 0209   LDLCALC 79 02/01/2018 1202    Additional studies/ records that were reviewed today include:   Echocardiogram: 01/23/2018 Study Conclusions  - Left ventricle: The cavity size was normal. Systolic function was   normal. The estimated ejection fraction was in the range of 60%   to 65%. Wall motion was normal; there were no regional wall   motion abnormalities. The study was not technically sufficient to   allow evaluation of LV diastolic dysfunction due to atrial   fibrillation. - Aortic valve: Trileaflet; normal thickness, mildly calcified   leaflets. Valve area (VTI): 2.65 cm^2. Valve area (Vmean): 3.21   cm^2. - Mitral valve: Calcified annulus. - Right ventricle: The cavity size was moderately dilated. Wall   thickness was normal. - Right atrium: The atrium was mildly dilated. - Tricuspid valve: There was moderate regurgitation. - Pulmonary arteries: PA peak pressure: 64 mm Hg (S).  Impressions:  - The right ventricular systolic pressure was increased consistent   with moderate pulmonary hypertension.  Cardiac Catheterization: 01/23/2018  Prox LAD lesion is 10% stenosed.  The  left ventricular ejection fraction is 50-55% by visual estimate.  LV end diastolic pressure is mildly elevated.  The left ventricular systolic function is normal.   No significant coronary obstructive disease with essentially normal coronary arteries and only mild luminal irregularity of the proximal LAD of 10%; normal left circumflex and normal dominant RCA.  Low normal global LV function with an ejection fraction of 50 to 55% without definitive focal segmental wall motion abnormalities.  LVEDP is 18 mmHg.  RECOMMENDATION: Medical therapy.  Weight loss is essential.  The patient should be evaluated for obstructive sleep apnea.  No indication for antiplatelet therapy at this time.  Echocardiogram: 03/01/2018 Study Conclusions  - Left ventricle: The cavity size was normal. Wall thickness was   increased in a pattern of mild LVH. Systolic function was normal.   The estimated ejection fraction was in the range of 55% to 60%.   Wall motion was normal; there were no regional wall motion   abnormalities. Left ventricular diastolic function parameters   were normal. - Aortic valve: Mildly calcified annulus. Trileaflet. There was no   stenosis. Mean gradient (S): 8 mm Hg. Valve area (VTI): 2.31   cm^2. - Mitral valve: Mildly calcified annulus. - Right ventricle: The cavity size was moderately dilated. Systolic   function was mildly reduced. - Right atrium: Central venous pressure (est): 3 mm Hg. - Atrial septum: No defect or patent foramen ovale was identified. - Tricuspid valve: There was mild regurgitation. - Pulmonary arteries: Systolic pressure was severely increased. PA   peak pressure: 87 mm Hg (S). - Pericardium, extracardiac: There was no pericardial effusion.  Assessment:    1. Coronary artery calcification   2. Heart palpitations   3. Essential hypertension   4. 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 Pulmonary hypertension, unspecified  ([place])      Plan:   In order of problems listed above:  1. Coronary Calcifications - recent cardiac catheterization showed 10% Proximal-LAD stenosis with no significant obstructive disease as outlined above. He denies any recurrent chest pain and dyspnea has improved since being started on anticoagulation for his PE.  - continue with risk factor modification. No longer on ASA given the need for anticoagulation.   2. Palpitations - He does note occasional palpitations but reports his symptoms have improved since being started on Toprol-XL by his PCP. Symptoms only last for a few seconds to minutes then spontaneously resolve. Was noted to have occasional PVC's by review of telemetry during his recent admission. Given that his symptoms have already started to improve, would not plan for further testing at this time as no significant arrhythmias were noted during admission. I encouraged him to make [place] aware if he continues to have palpitations despite recent initiation of Toprol-XL as an outpatient monitor could be considered at that time. Recommended limiting caffeine and alcohol consumption, especially alcohol use in the setting of being on anticoagulation (reports only drinking on a social basis).   3. HTN - BP is well controlled at 126/68 during today's visit. Continue Lisinopril-HCTZ and Toprol-XL at current dosing.  4. Saddle Pulmonary Embolism - Noted on CTA earlier this month with evidence of right heart strain by imaging. He reports good compliance with Xarelto and denies missing any recent doses. He remains on 15 mg twice daily with plans to reduce to 20 mg daily starting on 03/24/2018.  5. Pulmonary HTN - PA pressure elevated to 64 mmHg and 87 mmHg by most recent echocardiogram images. He has been scheduled for a sleep study by his PCP. Was strongly advised to keep this appointment given his pulmonary HTN and morbid obesity as he is at high-risk for sleep apnea.   Medication  Adjustments/Labs and Tests Ordered: Current medicines are reviewed at length with the patient today.  Concerns regarding medicines are outlined above.  Medication changes, Labs and Tests ordered today are listed in the Patient Instructions below. Patient Instructions  Medication Instructions:  Your physician recommends that you continue on your current medications as directed. Please refer to the Current Medication list given to you today.  If you need a refill on your cardiac medications before your next appointment, please call your pharmacy.   Lab work: NONE  If you have labs (blood work) drawn today and your tests are completely normal, you will receive your results only by: Marland Kitchen MyChart Message (if you have MyChart) OR . A paper copy in the mail If you have any lab test that is abnormal or we need to change your treatment, we will call you to review the results.  Testing/Procedures: NONE   Follow-Up: At [facility], you and your health needs are our priority.  As part of our continuing mission to provide you with exceptional heart care, we have created designated Provider Care Teams.  These Care Teams include your primary Cardiologist (physician) and Advanced Practice Providers (APPs -  Physician Assistants and Nurse Practitioners) who all work together to provide you with the care you need, when you need it. You will need a follow up appointment in 6 months.  Please call our office 2 months in advance to schedule this appointment.  You may see Kate Sable, MD or one of the following Advanced Practice Providers on your designated Care Team:   Bernerd Pho, PA-C [facility]) . Ermalinda Barrios, PA-C ([place])  Any Other Special Instructions Will Be Listed Below (If Applicable). Thank you for choosing [facility]!      Signed, Erma Heritage, PA-C  03/15/2018 5:19 PM    [place] [address] [place], [place]  [address] Phone: [phone]

## 2018-03-15 NOTE — Patient Instructions (Addendum)
Medication Instructions:  Your physician recommends that you continue on your current medications as directed. Please refer to the Current Medication list given to you today.  If you need a refill on your cardiac medications before your next appointment, please call your pharmacy.   Lab work: NONE  If you have labs (blood work) drawn today and your tests are completely normal, you will receive your results only by: Marland Kitchen MyChart Message (if you have MyChart) OR . A paper copy in the mail If you have any lab test that is abnormal or we need to change your treatment, we will call you to review the results.  Testing/Procedures: NONE   Follow-Up: At [facility], you and your health needs are our priority.  As part of our continuing mission to provide you with exceptional heart care, we have created designated Provider Care Teams.  These Care Teams include your primary Cardiologist (physician) and Advanced Practice Providers (APPs -  Physician Assistants and Nurse Practitioners) who all work together to provide you with the care you need, when you need it. You will need a follow up appointment in 6 months.  Please call our office 2 months in advance to schedule this appointment.  You may see Kate Sable, MD or one of the following Advanced Practice Providers on your designated Care Team:   Bernerd Pho, PA-C [facility]) . Ermalinda Barrios, PA-C ([place])  Any Other Special Instructions Will Be Listed Below (If Applicable). Thank you for choosing [facility]!     Potassium Content of Foods  Potassium is a mineral found in many foods and drinks. It affects how the heart works, and helps keep fluids and minerals balanced in the body. The amount of potassium you need each day depends on your age and any medical conditions you may have. Talk to your health care provider or dietitian about how much potassium you need. The following lists of foods provide the  general serving size for foods and the approximate amount of potassium in each serving, listed in milligrams (mg). Actual values may vary depending on the product and how it is processed. High in potassium The following foods and beverages have 200 mg or more of potassium per serving:  Apricots (raw) - 2 have 200 mg of potassium.  Apricots (dry) - 5 have 200 mg of potassium.  Artichoke - 1 medium has 345 mg of potassium.  Avocado -  fruit has 245 mg of potassium.  Banana - 1 medium fruit has 425 mg of potassium.  [place] or baked beans (canned) -  cup has 280 mg of potassium.  White beans (canned) -  cup has 595 mg potassium.  Beef roast - 3 oz has 320 mg of potassium.  Ground beef - 3 oz has 270 mg of potassium.  Beets (raw or cooked) -  cup has 260 mg of potassium.  Bran muffin - 2 oz has 300 mg of potassium.  Broccoli (cooked) -  cup has 230 mg of potassium.  Brussels sprouts -  cup has 250 mg of potassium.  Cantaloupe -  cup has 215 mg of potassium.  Cereal, 100% bran -  cup has 200-400 mg of potassium.  Cheeseburger -1 single fast food burger has 225-400 mg of potassium.  Chicken - 3 oz has 220 mg of potassium.  Clams (canned) - 3 oz has 535 mg of potassium.  Crab - 3 oz has 225 mg of potassium.  Dates - 5 have 270 mg of  potassium.  Dried beans and peas -  cup has 300-475 mg of potassium.  Figs (dried) - 2 have 260 mg of potassium.  Fish (halibut, tuna, cod, snapper) - 3 oz has 480 mg of potassium.  Fish (salmon, haddock, swordfish, perch) - 3 oz has 300 mg of potassium.  Fish (tuna, canned) - 3 oz has 200 mg of potassium.  [place] fries (fast food) - 3 oz has 470 mg of potassium.  Granola with fruit and nuts -  cup has 200 mg of potassium.  Grapefruit juice -  cup has 200 mg of potassium.  Honeydew melon -  cup has 200 mg of potassium.  Kale (raw) - 1 cup has 300 mg of potassium.  Kiwi - 1 medium fruit has 240 mg of potassium.  Kohlrabi,  rutabaga, parsnips -  cup has 280 mg of potassium.  Lentils -  cup has 365 mg of potassium.  Mango - 1 each has 325 mg of potassium.  Milk (nonfat, low-fat, whole, buttermilk) - 1 cup has 350-380 mg of potassium.  Milk (chocolate) - 1 cup has 420 mg of potassium  Molasses - 1 Tbsp has 295 mg of potassium.  Mushrooms -  cup has 280 mg of potassium.  Nectarine - 1 each has 275 mg of potassium.  Nuts (almonds, peanuts, hazelnuts, [place], cashew, mixed) - 1 oz has 200 mg of potassium.  Nuts (pistachios) - 1 oz has 295 mg of potassium.  Orange - 1 fruit has 240 mg of potassium.  Orange juice -  cup has 235 mg of potassium.  Papaya -  medium fruit has 390 mg of potassium.  Peanut butter (chunky) - 2 Tbsp has 240 mg of potassium.  Peanut butter (smooth) - 2 Tbsp has 210 mg of potassium.  Pear - 1 medium (200 mg) of potassium.  Pomegranate - 1 whole fruit has 400 mg of potassium.  Pomegranate juice -  cup has 215 mg of potassium.  Pork - 3 oz has 350 mg of potassium.  Potato chips (salted) - 1 oz has 465 mg of potassium.  Potato (baked with skin) - 1 medium has 925 mg of potassium.  Potato (boiled) -  cup has 255 mg of potassium.  Potato (Mashed) -  cup has 330 mg of potassium.  Prune juice -  cup has 370 mg of potassium.  Prunes - 5 have 305 mg of potassium.  Pudding (chocolate) -  cup has 230 mg of potassium.  Pumpkin (canned) -  cup has 250 mg of potassium.  Raisins (seedless) -  cup has 270 mg of potassium.  Seeds (sunflower or pumpkin) - 1 oz has 240 mg of potassium.  Soy milk - 1 cup has 300 mg of potassium.  Spinach (cooked) - 1/2 cup has 420 mg of potassium.  Spinach (canned) -  cup has 370 mg of potassium.  Sweet potato (baked with skin) - 1 medium has 450 mg of potassium.  Swiss chard -  cup has 480 mg of potassium.  Tomato or vegetable juice -  cup has 275 mg of potassium.  Tomato (sauce or puree) -  cup has 400-550 mg of  potassium.  Tomato (raw) - 1 medium has 290 mg of potassium.  Tomato (canned) -  cup has 200-300 mg of potassium.  [place] - 3 oz has 250 mg of potassium.  Wheat germ - 1 oz has 250 mg of potassium.  Winter squash -  cup has 250 mg of potassium.  Yogurt (plain or fruited) - 6 oz has 260-435 mg of potassium.  Zucchini -  cup has 220 mg of potassium. Moderate in potassium The following foods and beverages have 50-200 mg of potassium per serving:  Apple - 1 fruit has 150 mg of potassium  Apple juice -  cup has 150 mg of potassium  Applesauce -  cup has 90 mg of potassium  Apricot nectar -  cup has 140 mg of potassium  Asparagus (small spears) -  cup has 155 mg of potassium  Asparagus (large spears) - 6 have 155 mg of potassium  Bagel (cinnamon raisin) - 1 four-inch bagel has 130 mg of potassium  Bagel (egg or plain) - 1 four- inch bagel has 70 mg of potassium  Beans (green) -  cup has 90 mg of potassium  Beans (yellow) -  cup has 190 mg of potassium  Beer, regular - 12 oz has 100 mg of potassium  Beets (canned) -  cup has 125 mg of potassium  Blackberries -  cup has 115 mg of potassium  Blueberries -  cup has 60 mg of potassium  Bread (whole wheat) - 1 slice has 70 mg of potassium  Broccoli (raw) -  cup has 145 mg of potassium  Cabbage -  cup has 150 mg of potassium  Carrots (cooked or raw) -  cup has 180 mg of potassium  Cauliflower (raw) -  cup has 150 mg of potassium  Celery (raw) -  cup has 155 mg of potassium  Cereal, bran flakes -  cup has 120-150 mg of potassium  Cheese (cottage) -  cup has 110 mg of potassium  Cherries - 10 have 150 mg of potassium  Chocolate - 1 oz bar has 165 mg of potassium  Coffee (brewed) - 6 oz has 90 mg of potassium  Corn -  cup or 1 ear has 195 mg of potassium  Cucumbers -  cup has 80 mg of potassium  Egg - 1 large egg has 60 mg of potassium  Eggplant -  cup has 60 mg of potassium  Endive  (raw) -  cup has 80 mg of potassium  English muffin - 1 has 65 mg of potassium  Fish (ocean perch) - 3 oz has 192 mg of potassium  Frankfurter, beef or pork - 1 has 75 mg of potassium  Fruit cocktail -  cup has 115 mg of potassium  Grape juice -  cup has 170 mg of potassium  Grapefruit -  fruit has 175 mg of potassium  Grapes -  cup has 155 mg of potassium  Greens: kale, turnip, collard -  cup has 110-150 mg of potassium  Ice cream or frozen yogurt (chocolate) -  cup has 175 mg of potassium  Ice cream or frozen yogurt (vanilla) -  cup has 120-150 mg of potassium  Lemons, limes - 1 each has 80 mg of potassium  Lettuce - 1 cup has 100 mg of potassium  Mixed vegetables -  cup has 150 mg of potassium  Mushrooms, raw -  cup has 110 mg of potassium  Nuts (walnuts, pecans, or macadamia) - 1 oz has 125 mg of potassium  Oatmeal -  cup has 80 mg of potassium  Okra -  cup has 110 mg of potassium  Onions -  cup has 120 mg of potassium  Peach - 1 has 185 mg of potassium  Peaches (canned) -  cup has 120 mg of potassium  Pears (  canned) -  cup has 120 mg of potassium  Peas, green (frozen) -  cup has 90 mg of potassium  Peppers (Green) -  cup has 130 mg of potassium  Peppers (Red) -  cup has 160 mg of potassium  Pineapple juice -  cup has 165 mg of potassium  Pineapple (fresh or canned) -  cup has 100 mg of potassium  Plums - 1 has 105 mg of potassium  Pudding, vanilla -  cup has 150 mg of potassium  Raspberries -  cup has 90 mg of potassium  Rhubarb -  cup has 115 mg of potassium  Rice, wild -  cup has 80 mg of potassium  Shrimp - 3 oz has 155 mg of potassium  Spinach (raw) - 1 cup has 170 mg of potassium  Strawberries -  cup has 125 mg of potassium  Summer squash -  cup has 175-200 mg of potassium  Swiss chard (raw) - 1 cup has 135 mg of potassium  Tangerines - 1 fruit has 140 mg of potassium  Tea, brewed - 6 oz has 65 mg of  potassium  Turnips -  cup has 140 mg of potassium  Watermelon -  cup has 85 mg of potassium  Wine (Red, table) - 5 oz has 180 mg of potassium  Wine (White, table) - 5 oz 100 mg of potassium Low in potassium The following foods and beverages have less than 50 mg of potassium per serving.  Bread (white) - 1 slice has 30 mg of potassium  Carbonated beverages - 12 oz has less than 5 mg of potassium  Cheese - 1 oz has 20-30 mg of potassium  Cranberries -  cup has 45 mg of potassium  Cranberry juice cocktail -  cup has 20 mg of potassium  Fats and oils - 1 Tbsp has less than 5 mg of potassium  Hummus - 1 Tbsp has 32 mg of potassium  Nectar (papaya, mango, or pear) -  cup has 35 mg of potassium  Rice (white or brown) -  cup has 50 mg of potassium  Spaghetti or macaroni (cooked) -  cup has 30 mg of potassium  Tortilla, flour or corn - 1 has 50 mg of potassium  Waffle - 1 four-inch waffle has 50 mg of potassium  Water chestnuts -  cup has 40 mg of potassium Summary  Potassium is a mineral found in many foods and drinks. It affects how the heart works, and helps keep fluids and minerals balanced in the body.  The amount of potassium you need each day depends on your age and any existing medical conditions you may have. Your health care provider or dietitian may recommend an amount of potassium that you should have each day. This information is not intended to replace advice given to you by your health care provider. Make sure you discuss any questions you have with your health care provider. Document Released: 10/26/2004 Document Revised: 06/08/2016 Document Reviewed: 06/08/2016 Elsevier Interactive Patient Education  2019 [employer].

## 2018-03-24 ENCOUNTER — Other Ambulatory Visit: Payer: Self-pay | Admitting: Physician Assistant

## 2018-03-24 DIAGNOSIS — I10 Essential (primary) hypertension: Secondary | ICD-10-CM

## 2018-04-26 ENCOUNTER — Telehealth: Payer: Self-pay | Admitting: Neurology

## 2018-04-26 ENCOUNTER — Institutional Professional Consult (permissible substitution): Payer: Medicaid Other | Admitting: Neurology

## 2018-04-26 NOTE — Telephone Encounter (Signed)
Patient called and states that they are having car trouble and unable to make apt today. Pt advised of no show policy

## 2018-05-01 ENCOUNTER — Encounter: Payer: Self-pay | Admitting: Neurology

## 2018-05-03 ENCOUNTER — Institutional Professional Consult (permissible substitution): Payer: Medicaid Other | Admitting: Internal Medicine

## 2018-05-08 ENCOUNTER — Encounter: Payer: Self-pay | Admitting: Internal Medicine

## 2018-05-08 ENCOUNTER — Encounter: Payer: Self-pay | Admitting: Physician Assistant

## 2018-05-08 ENCOUNTER — Ambulatory Visit: Payer: Medicaid Other | Admitting: Physician Assistant

## 2018-05-08 VITALS — BP 129/86 | HR 84 | Temp 97.6°F | Ht 71.0 in | Wt 387.2 lb

## 2018-05-08 DIAGNOSIS — G894 Chronic pain syndrome: Secondary | ICD-10-CM | POA: Diagnosis not present

## 2018-05-08 DIAGNOSIS — G8929 Other chronic pain: Secondary | ICD-10-CM | POA: Diagnosis not present

## 2018-05-08 DIAGNOSIS — R7989 Other specified abnormal findings of blood chemistry: Secondary | ICD-10-CM | POA: Diagnosis not present

## 2018-05-08 DIAGNOSIS — M545 Low back pain, unspecified: Secondary | ICD-10-CM

## 2018-05-08 MED ORDER — OXYCODONE-ACETAMINOPHEN 10-325 MG PO TABS: 1.0000 | ORAL_TABLET | Freq: Three times a day (TID) | ORAL | 0 refills | Status: DC | PRN

## 2018-05-08 NOTE — Progress Notes (Signed)
BP 129/86   Pulse 84   Temp 97.6 F (36.4 C) (Oral)   Ht 5' 11" (1.803 m)   Wt (!) 387 lb 3.2 oz (175.6 kg)   BMI 54.00 kg/m    Subjective:    Patient ID: Bradley Torres, male    DOB: 31-Oct-1960, 58 y.o.   MRN: [id number]  HPI: Bradley Torres is a 58 y.o. male presenting on 05/08/2018 for Hypertension (3 month )  This patient comes in for periodic recheck on medications and conditions including hypertension, abnormal creatinine, obesity   All medications are reviewed today. There are no reports of any problems with the medications. All of the medical conditions are reviewed and updated.  Lab work is reviewed and will be ordered as medically necessary. There are no new problems reported with today's visit.  This patient returns for a 6 month recheck on narcotic use for chronic pain syndrome from OA and DDD and medication refills  Patient currently taking oxycodone. Behavior-normal Medication side effects-none Any concerns-no  PMP AWARE website reviewed: Yes Any suspicious activity on PMP Aware: No   Past Medical History:  Diagnosis Date  . CAD (coronary artery disease)    a. 12/2017: cath showing 10% Proximal-LAD stenosis with no significant obstructive disease and LVEDP mildly elevated at 18 mm Hg.   Marland Kitchen Chronic back pain   . DVT (deep venous thrombosis) ([place])   . Essential hypertension   . Morbid obesity ([place])   . Panic disorder   . Prediabetes   . Shoulder pain, left    Following fall   Relevant past medical, surgical, family and social history reviewed and updated as indicated. Interim medical history since our last visit reviewed. Allergies and medications reviewed and updated. DATA REVIEWED: CHART IN EPIC  Family History reviewed for pertinent findings.  Review of Systems  Constitutional: Negative.  Negative for appetite change and fatigue.  HENT: Negative.   Eyes: Negative.  Negative for pain and visual disturbance.  Respiratory: Positive for cough and  shortness of breath. Negative for chest tightness and wheezing.   Cardiovascular: Negative.  Negative for chest pain, palpitations and leg swelling.  Gastrointestinal: Negative.  Negative for abdominal pain, diarrhea, nausea and vomiting.  Endocrine: Negative.   Genitourinary: Negative.   Musculoskeletal: Positive for arthralgias, back pain, gait problem and myalgias.  Skin: Negative.  Negative for color change and rash.  Neurological: Negative for weakness, numbness and headaches.  Psychiatric/Behavioral: Negative.     Allergies as of 05/08/2018      Reactions   Ibuprofen Other (See Comments)   Rectal bleed   Diflucan [fluconazole] Rash   Losartan Rash      Medication List       Accurate as of May 08, 2018  2:23 PM. Always use your most recent med list.        cyclobenzaprine 10 MG tablet Commonly known as:  FLEXERIL TAKE 1 TABLET BY MOUTH THREE TIMES DAILY AS NEEDED   lisinopril-hydrochlorothiazide 20-25 MG tablet Commonly known as:  PRINZIDE,ZESTORETIC Take 1 tablet by mouth daily.   metoprolol succinate 50 MG 24 hr tablet Commonly known as:  TOPROL-XL Take 1 tablet (50 mg total) by mouth daily. Take with or immediately following a meal.   oxyCODONE-acetaminophen 10-325 MG tablet Commonly known as:  PERCOCET Take 1 tablet by mouth every 8 (eight) hours as needed for pain.   oxyCODONE-acetaminophen 10-325 MG tablet Commonly known as:  PERCOCET Take 1 tablet by mouth every 8 (eight)  hours as needed for pain.   oxyCODONE-acetaminophen 10-325 MG tablet Commonly known as:  PERCOCET Take 1 tablet by mouth every 8 (eight) hours as needed for pain.   rivaroxaban 20 MG Tabs tablet Commonly known as:  XARELTO Take 1 tablet (20 mg total) by mouth daily with supper.   terbinafine 1 % cream Commonly known as:  LAMISIL Apply 1 application topically 2 (two) times daily as needed (FOR ITCHY SKIN.).          Objective:    BP 129/86   Pulse 84   Temp 97.6 F  (36.4 C) (Oral)   Ht 5' 11" (1.803 m)   Wt (!) 387 lb 3.2 oz (175.6 kg)   BMI 54.00 kg/m   Allergies  Allergen Reactions  . Ibuprofen Other (See Comments)    Rectal bleed  . Diflucan [Fluconazole] Rash  . Losartan Rash    Wt Readings from Last 3 Encounters:  05/08/18 (!) 387 lb 3.2 oz (175.6 kg)  03/15/18 (!) 383 lb (173.7 kg)  03/06/18 (!) 392 lb 4.8 oz (177.9 kg)    Physical Exam Vitals signs and nursing note reviewed.  Constitutional:      General: He is not in acute distress.    Appearance: He is well-developed.  HENT:     Head: Normocephalic and atraumatic.  Eyes:     Conjunctiva/sclera: Conjunctivae normal.     Pupils: Pupils are equal, round, and reactive to light.  Cardiovascular:     Rate and Rhythm: Normal rate and regular rhythm.     Heart sounds: Normal heart sounds.  Pulmonary:     Effort: Pulmonary effort is normal. No respiratory distress.     Breath sounds: Normal breath sounds.  Skin:    General: Skin is warm and dry.  Psychiatric:        Behavior: Behavior normal.     Results for orders placed or performed during the hospital encounter of 02/27/18  MRSA PCR Screening  Result Value Ref Range   MRSA by PCR NEGATIVE NEGATIVE  Basic metabolic panel  Result Value Ref Range   Sodium 138 135 - 145 mmol/L   Potassium 3.1 (L) 3.5 - 5.1 mmol/L   Chloride 101 98 - 111 mmol/L   CO2 26 22 - 32 mmol/L   Glucose, Bld 135 (H) 70 - 99 mg/dL   BUN 26 (H) 6 - 20 mg/dL   Creatinine, Ser 1.86 (H) 0.61 - 1.24 mg/dL   Calcium 9.0 8.9 - 10.3 mg/dL   GFR calc non Af Amer 40 (L) >60 mL/min   GFR calc Af Amer 46 (L) >60 mL/min   Anion gap 11 5 - 15  CBC with Differential  Result Value Ref Range   WBC 11.2 (H) 4.0 - 10.5 K/uL   RBC 4.61 4.22 - 5.81 MIL/uL   Hemoglobin 14.3 13.0 - 17.0 g/dL   HCT 44.2 39.0 - 52.0 %   MCV 95.9 80.0 - 100.0 fL   MCH 31.0 26.0 - 34.0 pg   MCHC 32.4 30.0 - 36.0 g/dL   RDW 12.8 11.5 - 15.5 %   Platelets 278 150 - 400 K/uL   nRBC  0.0 0.0 - 0.2 %   Neutrophils Relative % 80 %   Neutro Abs 9.1 (H) 1.7 - 7.7 K/uL   Lymphocytes Relative 9 %   Lymphs Abs 1.0 0.7 - 4.0 K/uL   Monocytes Relative 8 %   Monocytes Absolute 0.9 0.1 - 1.0 K/uL   Eosinophils  Relative 1 %   Eosinophils Absolute 0.1 0.0 - 0.5 K/uL   Basophils Relative 1 %   Basophils Absolute 0.1 0.0 - 0.1 K/uL   Immature Granulocytes 1 %   Abs Immature Granulocytes 0.07 0.00 - 0.07 K/uL  Troponin I - Once  Result Value Ref Range   Troponin I 0.19 (HH) <0.03 ng/mL  Brain natriuretic peptide  Result Value Ref Range   B Natriuretic Peptide 309.0 (H) 0.0 - 100.0 pg/mL  CBC  Result Value Ref Range   WBC 7.2 4.0 - 10.5 K/uL   RBC 4.26 4.22 - 5.81 MIL/uL   Hemoglobin 13.3 13.0 - 17.0 g/dL   HCT 41.9 39.0 - 52.0 %   MCV 98.4 80.0 - 100.0 fL   MCH 31.2 26.0 - 34.0 pg   MCHC 31.7 30.0 - 36.0 g/dL   RDW 13.0 11.5 - 15.5 %   Platelets 236 150 - 400 K/uL   nRBC 0.0 0.0 - 0.2 %  Heparin level (unfractionated)  Result Value Ref Range   Heparin Unfractionated 0.77 (H) 0.30 - 0.70 IU/mL  APTT  Result Value Ref Range   aPTT 29 24 - 36 seconds  Basic metabolic panel  Result Value Ref Range   Sodium 139 135 - 145 mmol/L   Potassium 3.9 3.5 - 5.1 mmol/L   Chloride 103 98 - 111 mmol/L   CO2 28 22 - 32 mmol/L   Glucose, Bld 119 (H) 70 - 99 mg/dL   BUN 25 (H) 6 - 20 mg/dL   Creatinine, Ser 1.66 (H) 0.61 - 1.24 mg/dL   Calcium 8.6 (L) 8.9 - 10.3 mg/dL   GFR calc non Af Amer 45 (L) >60 mL/min   GFR calc Af Amer 53 (L) >60 mL/min   Anion gap 8 5 - 15  Magnesium  Result Value Ref Range   Magnesium 2.1 1.7 - 2.4 mg/dL  Heparin level (unfractionated)  Result Value Ref Range   Heparin Unfractionated 0.47 0.30 - 0.70 IU/mL  Heparin level (unfractionated)  Result Value Ref Range   Heparin Unfractionated 0.44 0.30 - 0.70 IU/mL  CBC  Result Value Ref Range   WBC 6.6 4.0 - 10.5 K/uL   RBC 4.09 (L) 4.22 - 5.81 MIL/uL   Hemoglobin 12.8 (L) 13.0 - 17.0 g/dL   HCT  40.2 39.0 - 52.0 %   MCV 98.3 80.0 - 100.0 fL   MCH 31.3 26.0 - 34.0 pg   MCHC 31.8 30.0 - 36.0 g/dL   RDW 12.8 11.5 - 15.5 %   Platelets 228 150 - 400 K/uL   nRBC 0.0 0.0 - 0.2 %  Heparin level (unfractionated)  Result Value Ref Range   Heparin Unfractionated 0.53 0.30 - 0.70 IU/mL  Basic metabolic panel  Result Value Ref Range   Sodium 140 135 - 145 mmol/L   Potassium 3.5 3.5 - 5.1 mmol/L   Chloride 103 98 - 111 mmol/L   CO2 29 22 - 32 mmol/L   Glucose, Bld 102 (H) 70 - 99 mg/dL   BUN 21 (H) 6 - 20 mg/dL   Creatinine, Ser 1.49 (H) 0.61 - 1.24 mg/dL   Calcium 8.8 (L) 8.9 - 10.3 mg/dL   GFR calc non Af Amer 52 (L) >60 mL/min   GFR calc Af Amer 60 (L) >60 mL/min   Anion gap 8 5 - 15  CBC  Result Value Ref Range   WBC 6.5 4.0 - 10.5 K/uL   RBC 4.53 4.22 - 5.81 MIL/uL  Hemoglobin 14.6 13.0 - 17.0 g/dL   HCT 45.7 39.0 - 52.0 %   MCV 100.9 (H) 80.0 - 100.0 fL   MCH 32.2 26.0 - 34.0 pg   MCHC 31.9 30.0 - 36.0 g/dL   RDW 12.7 11.5 - 15.5 %   Platelets 271 150 - 400 K/uL   nRBC 0.0 0.0 - 0.2 %  Heparin level (unfractionated)  Result Value Ref Range   Heparin Unfractionated 0.50 0.30 - 0.70 IU/mL  Basic metabolic panel  Result Value Ref Range   Sodium 139 135 - 145 mmol/L   Potassium 2.9 (L) 3.5 - 5.1 mmol/L   Chloride 102 98 - 111 mmol/L   CO2 25 22 - 32 mmol/L   Glucose, Bld 103 (H) 70 - 99 mg/dL   BUN 16 6 - 20 mg/dL   Creatinine, Ser 1.42 (H) 0.61 - 1.24 mg/dL   Calcium 9.0 8.9 - 10.3 mg/dL   GFR calc non Af Amer 55 (L) >60 mL/min   GFR calc Af Amer >60 >60 mL/min   Anion gap 12 5 - 15  ECHOCARDIOGRAM COMPLETE  Result Value Ref Range   Weight 6,031.79 oz   Height 71 in   BP 124/72 mmHg      Assessment & Plan:   1. Chronic low back pain, unspecified back pain laterality, unspecified whether sciatica present - oxyCODONE-acetaminophen (PERCOCET) 10-325 MG tablet; Take 1 tablet by mouth every 8 (eight) hours as needed for pain.  Dispense: 90 tablet; Refill: 0 -  oxyCODONE-acetaminophen (PERCOCET) 10-325 MG tablet; Take 1 tablet by mouth every 8 (eight) hours as needed for pain.  Dispense: 90 tablet; Refill: 0 - oxyCODONE-acetaminophen (PERCOCET) 10-325 MG tablet; Take 1 tablet by mouth every 8 (eight) hours as needed for pain.  Dispense: 90 tablet; Refill: 0 - ToxASSURE Select 13 (MW), Urine  2. Chronic pain syndrome - oxyCODONE-acetaminophen (PERCOCET) 10-325 MG tablet; Take 1 tablet by mouth every 8 (eight) hours as needed for pain.  Dispense: 90 tablet; Refill: 0 - oxyCODONE-acetaminophen (PERCOCET) 10-325 MG tablet; Take 1 tablet by mouth every 8 (eight) hours as needed for pain.  Dispense: 90 tablet; Refill: 0 - oxyCODONE-acetaminophen (PERCOCET) 10-325 MG tablet; Take 1 tablet by mouth every 8 (eight) hours as needed for pain.  Dispense: 90 tablet; Refill: 0 - ToxASSURE Select 13 (MW), Urine  3. Elevated serum creatinine - BMP8+EGFR - Microalbumin / creatinine urine ratio  4. Morbid (severe) obesity due to excess calories (HCC) Diet and protein rich diet   Continue all other maintenance medications as listed above.  Follow up plan: No follow-ups on file.  Educational handout given for [place] PA-C [place] [address]  [place], [place] [address] [phone]   05/08/2018, 2:23 PM

## 2018-05-09 ENCOUNTER — Encounter: Payer: Self-pay | Admitting: Internal Medicine

## 2018-05-09 ENCOUNTER — Ambulatory Visit (INDEPENDENT_AMBULATORY_CARE_PROVIDER_SITE_OTHER): Payer: Medicaid Other | Admitting: Internal Medicine

## 2018-05-09 VITALS — BP 130/90 | HR 98 | Ht 72.0 in | Wt 383.0 lb

## 2018-05-09 DIAGNOSIS — I2729 Other secondary pulmonary hypertension: Secondary | ICD-10-CM | POA: Diagnosis not present

## 2018-05-09 DIAGNOSIS — R05 Cough: Secondary | ICD-10-CM | POA: Diagnosis not present

## 2018-05-09 DIAGNOSIS — I2699 Other pulmonary embolism without acute cor pulmonale: Secondary | ICD-10-CM | POA: Insufficient documentation

## 2018-05-09 DIAGNOSIS — R058 Other specified cough: Secondary | ICD-10-CM

## 2018-05-09 LAB — BMP8+EGFR
BUN/Creatinine Ratio: 12 (ref 9–20)
BUN: 19 mg/dL (ref 6–24)
CO2: 24 mmol/L (ref 20–29)
Calcium: 9.5 mg/dL (ref 8.7–10.2)
Chloride: 97 mmol/L (ref 96–106)
Creatinine, Ser: 1.59 mg/dL — ABNORMAL HIGH (ref 0.76–1.27)
GFR calc Af Amer: 55 mL/min/{1.73_m2} — ABNORMAL LOW (ref >59)
GFR, EST NON AFRICAN AMERICAN: 48 mL/min/{1.73_m2} — AB (ref >59)
Glucose: 161 mg/dL — ABNORMAL HIGH (ref 65–99)
POTASSIUM: 3.8 mmol/L (ref 3.5–5.2)
SODIUM: 139 mmol/L (ref 134–144)

## 2018-05-09 LAB — MICROALBUMIN / CREATININE URINE RATIO
Creatinine, Urine: 381.3 mg/dL
Microalb/Creat Ratio: 10 mg/g creat (ref 0–29)
Microalbumin, Urine: 40 ug/mL

## 2018-05-09 NOTE — Patient Instructions (Addendum)
Please see patient coordinator before you leave today  to schedule Echocardiogram at [facility] 07/01/18  - add ordered ono RA and needs TSH now and  yearly if not already  done by PCP

## 2018-05-09 NOTE — Assessment & Plan Note (Addendum)
Onset of symptoms ? 2018 vs Oct 2019  Echo 01/23/18  = 63 CTa 02/27/18 c/w saddle PE  And R venous doppler Pos  DVT Echo 03/01/18 >   PAS 87 >  Repeat 07/01/18   Clearly the acute change in PAS was due to PE but hx suggests he may have had chronic pe previously vs another cause like osa.  He is reluctant to embark on additional w/u for this and least invasive way to eval would be ono on RA which I rec now plus f/u ECHO at 6 months = 07/01/18.  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

## 2018-05-09 NOTE — Progress Notes (Signed)
Bradley Torres, male    DOB: Nov 21, 1960,     MRN: [id number]   Brief patient profile:  46 yowm never smoker "always over weight" but usually had reasonable  activity tolerance but starting fall 2018 worsening doe  with light headedness and much worse over about a week's period of time then to  ER  01/19/18 > nl heart cath via R arm/ Echo noted PAS 63 so rec for sleep eval but "pt resistant" per ds summary 01/23/18   > breathing no better then worse calf pain nov 2019 then L hand weak/numb transiently >  02/27/18 venous doppler positive and CTa 02/27/18 pos saddle empbolus rx IV hep and d/c on DOAC and referred to pulmonary clinic 05/09/2018 by [name] with improving sob.       History of Present Illness  05/09/2018  Pulmonary/ 1st office eval/Aleida Crandell  Chief Complaint  Patient presents with  . Pulmonary Consult    Referred by [name], PA.  Pt has hx of PE- on Xarelto. He has occ cough with clear sputum. He feels his breathing is doing well.   Dyspnea:  Improving but still = MMRC2 = can't walk a nl pace on a flat grade s sob but does fine slow and flat  Cough: min dry cough since starting acei day > noct  Sleep: says fine/ denies excessive hypersomnolence SABA use: none   DOE s variability or  assoc excess/ purulent sputum or mucus plugs or hemoptysis or cp or chest tightness, subjective wheeze or overt sinus or hb symptoms.   Sleeping flat bed 2 pillows  without nocturnal  or early am exacerbation  of respiratory  c/o's or need for noct saba. Also denies any obvious fluctuation of symptoms with weather or environmental changes or other aggravating or alleviating factors except as outlined above   No unusual exposure hx or h/o childhood pna/ asthma or knowledge of premature birth.  Current Allergies, Complete Past Medical History, Past Surgical History, Family History, and Social History were reviewed in [employer] record.  ROS  The following are not active  complaints unless bolded Hoarseness, sore throat, dysphagia, dental problems, itching, sneezing,  nasal congestion or discharge of excess mucus or purulent secretions, ear ache,   fever, chills, sweats, unintended wt loss or wt gain, classically pleuritic or exertional cp,  orthopnea pnd or arm/hand swelling  or leg swelling, presyncope, palpitations, abdominal pain, anorexia, nausea, vomiting, diarrhea  or change in bowel habits or change in bladder habits, change in stools or change in urine, dysuria, hematuria,  rash, arthralgias, visual complaints, headache, numbness, weakness or ataxia or problems with walking or coordination,  change in mood or  memory.             Past Medical History:  Diagnosis Date  . CAD (coronary artery disease)    a. 12/2017: cath showing 10% Proximal-LAD stenosis with no significant obstructive disease and LVEDP mildly elevated at 18 mm Hg.   Marland Kitchen Chronic back pain   . DVT (deep venous thrombosis) ([place])   . Essential hypertension   . Morbid obesity ([place])   . Panic disorder   . Prediabetes   . Shoulder pain, left    Following fall    Outpatient Medications Prior to Visit  Medication Sig Dispense Refill  . cyclobenzaprine (FLEXERIL) 10 MG tablet TAKE 1 TABLET BY MOUTH THREE TIMES DAILY AS NEEDED (Patient taking differently: Take 10 mg by mouth 3 (three) times daily as needed  for muscle spasms. ) 90 tablet 0  . lisinopril-hydrochlorothiazide (PRINZIDE,ZESTORETIC) 20-25 MG tablet Take 1 tablet by mouth daily. 90 tablet 3  . metoprolol succinate (TOPROL-XL) 50 MG 24 hr tablet Take 1 tablet (50 mg total) by mouth daily. Take with or immediately following a meal. 90 tablet 3  . oxyCODONE-acetaminophen (PERCOCET) 10-325 MG tablet Take 1 tablet by mouth every 8 (eight) hours as needed for pain. 90 tablet 0  . rivaroxaban (XARELTO) 20 MG TABS tablet Take 1 tablet (20 mg total) by mouth daily with supper. 30 tablet 3  . terbinafine (LAMISIL) 1 % cream Apply 1 application  topically 2 (two) times daily as needed (FOR ITCHY SKIN.). 30 g 5  . oxyCODONE-acetaminophen (PERCOCET) 10-325 MG tablet Take 1 tablet by mouth every 8 (eight) hours as needed for pain. 90 tablet 0  . oxyCODONE-acetaminophen (PERCOCET) 10-325 MG tablet Take 1 tablet by mouth every 8 (eight) hours as needed for pain. 90 tablet 0      Objective:     BP 130/90 (BP Location: Left Arm, Cuff Size: Large)   Pulse 98   Ht 6' (1.829 m)   Wt (!) 383 lb (173.7 kg)   SpO2 95%   BMI 51.94 kg/m   SpO2: 95 %  RA  Pleasant massively obese wm nad   HEENT: nl dentition, turbinates bilaterally, and oropharynx. Nl external ear canals without cough reflex   NECK :  without JVD/Nodes/TM/ nl carotid upstrokes bilaterally   LUNGS: no acc muscle use,  Nl contour chest which is clear to A and P bilaterally without cough on insp or exp maneuvers   CV:  RRR  no s3 or murmur  - slt increase in P2, and no edema   ABD:  Obese soft and nontender with limited inspiratory excursion in the supine position. No bruits or organomegaly appreciated, bowel sounds nl  MS:  Nl gait/ ext warm without deformities, calf tenderness, cyanosis or clubbing No obvious joint restrictions   SKIN: warm and dry without lesions    NEURO:  alert, approp, nl sensorium with  no motor or cerebellar deficits apparent.     Labs ordered/ reviewed:      Chemistry      Component Value Date/Time   NA 139 05/08/2018 1121   K 3.8 05/08/2018 1121   CL 97 05/08/2018 1121   CO2 24 05/08/2018 1121   BUN 19 05/08/2018 1121   CREATININE 1.59 (H) 05/08/2018 1121      Component Value Date/Time   CALCIUM 9.5 05/08/2018 1121   ALKPHOS 63 02/01/2018 1202   AST 21 02/01/2018 1202   ALT 34 02/01/2018 1202   BILITOT 0.6 02/01/2018 1202        Lab Results  Component Value Date   WBC 6.5 03/02/2018   HGB 14.6 03/02/2018   HCT 45.7 03/02/2018   MCV 100.9 (H) 03/02/2018   PLT 271 03/02/2018              Assessment    Thromboembolic pulmonary hypertension (HCC) Onset of symptoms ? 2018 vs Oct 2019  Echo 01/23/18  = 63 CTa 02/27/18 c/w saddle PE  And R venous doppler Pos  DVT Echo 03/01/18 >   PAS 87 >  Repeat 07/01/18 ordered    Clearly the acute change in PAS was due to PE but hx suggests he may have had chronic pe previously vs another cause like osa.  He is reluctant to embark on additional w/u for this and  least invasive way to eval would be ono on RA which I rec now plus f/u ECHO at 6 months = 07/01/18.  In meantime, based on obesity and likely some permanent degree of PH should probably be on DOAC indefinitely  Discussed in detail all the  indications, usual  risks and alternatives  relative to the benefits with patient who agrees to proceed with w/u as outlined.         Upper airway cough syndrome Developed while on ACEi, first reported 05/09/2018 but mild   Reasonable to continue ACEi for now but change to ARB other than losartan if worse based on For reasons that may related to vascular permability and nitric oxide pathways but not elevated  bradykinin levels (as seen with  ACEi use) losartan in the generic form has been reported now from mulitple sources  to cause a similar pattern of non-specific  upper airway symptoms as seen with acei.   This has not been reported with exposure to the other ARB's to date, so it seems reasonable for now to try either generic diovan or avapro if ARB needed or use an alternative class altogether.  See:  Lelon Frohlich Allergy Asthma Immunol  2008: 101: p [phone]       Obesity, Class III, BMI 40-49.9 (morbid obesity) ([place]) Complicated by DVT/ PE in 2019   Body mass index is 51.94 kg/m.    No results found for: TSH   Contributing to gerd risk/ doe/reviewed the need and the process to achieve and maintain neg calorie balance > defer f/u primary care including intermittently monitoring thyroid status     Total time devoted to counseling  > 50 % of initial 60 min office  visit:  review case with pt/ including details from his last 2 admits/  discussion of options/alternatives/ personally creating written customized instructions  in presence of pt  then going over those specific  Instructions directly with the pt including how to use all of the meds but in particular covering each new medication in detail and the difference between the maintenance= "automatic" meds and the prns using an action plan format for the latter (If this problem/symptom => do that organization reading Left to right).  Please see AVS from this visit for a full list of these instructions which I personally wrote for this pt and  are unique to this visit.       Christinia Gully, MD 05/09/2018

## 2018-05-10 ENCOUNTER — Telehealth: Payer: Self-pay | Admitting: *Deleted

## 2018-05-10 ENCOUNTER — Encounter: Payer: Self-pay | Admitting: Internal Medicine

## 2018-05-10 DIAGNOSIS — R05 Cough: Secondary | ICD-10-CM | POA: Insufficient documentation

## 2018-05-10 DIAGNOSIS — R058 Other specified cough: Secondary | ICD-10-CM | POA: Insufficient documentation

## 2018-05-10 NOTE — Telephone Encounter (Signed)
-----   Message from Tanda Rockers, MD sent at 05/10/2018  5:18 AM EST ----- See if Bradley Torres office has a TSH on him  I ordered ono RA to complete the w/u so let him know they'll be calling

## 2018-05-10 NOTE — Assessment & Plan Note (Addendum)
Complicated by DVT/ PE in 2019   Body mass index is 51.94 kg/m.   No results found for: TSH   Contributing to gerd risk/ doe/reviewed the need and the process to achieve and maintain neg calorie balance > defer f/u primary care including intermittently monitoring thyroid status

## 2018-05-10 NOTE — Telephone Encounter (Signed)
Bradley Torres is in Epic and there is no TSH in the system   LMTCB to let him know about ONO

## 2018-05-10 NOTE — Assessment & Plan Note (Signed)
Developed while on ACEi, first reported 05/09/2018 but mild   Reasonable to continue ACEi for now but change to ARB other than losartan if worse based on For reasons that may related to vascular permability and nitric oxide pathways but not elevated  bradykinin levels (as seen with  ACEi use) losartan in the generic form has been reported now from mulitple sources  to cause a similar pattern of non-specific  upper airway symptoms as seen with acei.   This has not been reported with exposure to the other ARB's to date, so it seems reasonable for now to try either generic diovan or avapro if ARB needed or use an alternative class altogether.  See:  Lelon Frohlich Allergy Asthma Immunol  2008: 101: p [phone]

## 2018-05-12 LAB — TOXASSURE SELECT 13 (MW), URINE

## 2018-05-14 NOTE — Telephone Encounter (Signed)
LMTCB

## 2018-05-15 NOTE — Telephone Encounter (Signed)
Pt returned call. Informed him of the ONO. Pt verbalized understanding and denied any further questions or concerns at this time.

## 2018-05-22 ENCOUNTER — Encounter: Payer: Self-pay | Admitting: Internal Medicine

## 2018-05-23 ENCOUNTER — Telehealth: Payer: Self-pay | Admitting: Internal Medicine

## 2018-05-23 NOTE — Telephone Encounter (Signed)
ONO on RA done by [facility] on 05/22/2018  Per MW- pt needs to begin o2 with sleep 2lpm and then have them repeat the ONO on 2lpm  LMTCB for the pt to discuss before ordering o2 and ONO

## 2018-05-25 NOTE — Telephone Encounter (Signed)
lmtcb x1 for pt. 

## 2018-05-25 NOTE — Telephone Encounter (Signed)
Correct, should do sleep study and see if needs cpap which might correct the 02 issue without ever needing any 02 at hs

## 2018-05-25 NOTE — Telephone Encounter (Signed)
Spoke with pt and notified of results per Dr. Melvyn Novas. Pt verbalized understanding.  Pt states that he is scheduled to have a sleep study soon and wants to know if he should hold off on the noct o2  He does not want to begin o2 if he will end up needing CPAP  Dr Melvyn Novas what do you think?

## 2018-05-29 NOTE — Telephone Encounter (Signed)
LMTCB

## 2018-06-01 NOTE — Telephone Encounter (Signed)
LMTCB

## 2018-06-05 ENCOUNTER — Ambulatory Visit: Payer: Medicaid Other | Admitting: Neurology

## 2018-06-05 ENCOUNTER — Encounter: Payer: Self-pay | Admitting: Neurology

## 2018-06-05 VITALS — BP 122/84 | HR 84 | Ht 72.0 in | Wt 382.0 lb

## 2018-06-05 DIAGNOSIS — N183 Chronic kidney disease, stage 3 unspecified: Secondary | ICD-10-CM

## 2018-06-05 DIAGNOSIS — J9621 Acute and chronic respiratory failure with hypoxia: Secondary | ICD-10-CM | POA: Insufficient documentation

## 2018-06-05 DIAGNOSIS — I214 Non-ST elevation (NSTEMI) myocardial infarction: Secondary | ICD-10-CM

## 2018-06-05 DIAGNOSIS — R29810 Facial weakness: Secondary | ICD-10-CM | POA: Diagnosis not present

## 2018-06-05 DIAGNOSIS — I5032 Chronic diastolic (congestive) heart failure: Secondary | ICD-10-CM | POA: Diagnosis not present

## 2018-06-05 DIAGNOSIS — I639 Cerebral infarction, unspecified: Secondary | ICD-10-CM

## 2018-06-05 DIAGNOSIS — I2692 Saddle embolus of pulmonary artery without acute cor pulmonale: Secondary | ICD-10-CM | POA: Diagnosis not present

## 2018-06-05 DIAGNOSIS — Z72821 Inadequate sleep hygiene: Secondary | ICD-10-CM | POA: Diagnosis not present

## 2018-06-05 DIAGNOSIS — Z6841 Body Mass Index (BMI) 40.0 and over, adult: Secondary | ICD-10-CM

## 2018-06-05 DIAGNOSIS — M67912 Unspecified disorder of synovium and tendon, left shoulder: Secondary | ICD-10-CM | POA: Diagnosis not present

## 2018-06-05 NOTE — Progress Notes (Signed)
SLEEP MEDICINE CLINIC   Provider:  Larey Seat, MD   Primary Care Physician:  Terald Sleeper, PA-C   Referring Provider: Terald Sleeper, PA-C   Chief Complaint  Patient presents with  . New Patient (Initial Visit)    pt alone, rm 10. pt states that difficulty with sleeping at night. wakes up frequently during the night. snores in sleep. pt states that he wakes up gasping for air sometimes. he had a overnight oximetry done last week which indicated that his oxygen level dropped. never had a sleep study completed.    HPI:  Bradley Torres is a 58 y.o. 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 Now on Xeralto. He is seen here on 06-05-2018 as in a referral from [place] for sleep evaluation. The patient is super-obese, deconditioned and has CKD grade 3; bordeline DM, and HTN. He was seen by [facility] pulmonologist Dr Melvyn Novas who ordered oxygen after ONO which was delivered to his house. He rejected  the oxygen as he wanted to see Sleep- Doc first. He never had a sleep study.   Chief complaint according to patient : The patient states that his sleep has not changed following the pulmonary embolism, but he was well aware of a sense of being doomed a great anxiety provoking sensation when he suffered the pulmonary embolism he also became limp and clumsy in his left hand and arm.  And he has a slight left facial droop.  He is is aware that he most likely suffered a stroke at the same time that he suffered a pulmonary embolism.  Sleep habits are as follows: Supper time is 7 Pm, and bedtime is variable - he sits in his recliner and since he can only heat his bedroom he spends the evening there. Watching TV.  He often falls asleep in his chair on and off, and by 3 or 4 AM he may decide to transfer to his bed.  He prefers a lateral sleep position, uses 2 pillows for head support.  The bed is not adjustable.  He describes his sleep is very  fragmented he wakes up every 1 or 2 hours, it is not always clear what wakes him.  Sometimes his mind is just busy and he stares at the ceiling. Is racing mind that keeps him from going back to sleep.  He estimates 5 to 6 hours of sleep in total but broken up and many small sleep phases. Rises at lunchtime, 12 noon. He feels rested.    Sleep medical history: see above.  Bradley Torres had been evaluated for chest pain but no cardiac reason was identified.  While he was hospitalized for pulmonary embolism at [facility] and he was hospitalized from October 30 th for 5 days , had cardiac Cath. He had PE and CVA on thanksgiving, was at [employer]. Depression.   Family sleep history:  Maternal uncle has OSA. Father was alcoholic.   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 Never smoked, social drinker, caffeine _ iced tea - sweet 3 a day.    Review of Systems: Out of a complete 14 system review, the patient complains of only the following symptoms, and all other reviewed systems are negative.  left hand and arm clumsiness.  Poor sleep hygiene.  Epworth score: 14/ 24  , Fatigue severity score 62/ 63 -  very high   , depression score.   Social History   Socioeconomic History  . Marital status: Single    Spouse name: Not on file  . Number of children: Not on file  . Years of education: Not on file  . Highest education level: Not on file  Occupational History  . Not on file  Social Needs  . Financial resource strain: Not on file  . Food insecurity:    Worry: Not on file    Inability: Not on file  . Transportation needs:    Medical: Not on file    Non-medical: Not on file  Tobacco Use  . Smoking status: Never Smoker  . Smokeless tobacco: Never Used  Substance and Sexual Activity  . Alcohol use: Yes    Comment: rare.3  . Drug use: Yes    Types: Marijuana    Comment: occassional  . Sexual activity: Not  Currently    Birth control/protection: None  Lifestyle  . Physical activity:    Days per week: Not on file    Minutes per session: Not on file  . Stress: Not on file  Relationships  . Social connections:    Talks on phone: Not on file    Gets together: Not on file    Attends religious service: Not on file    Active member of club or organization: Not on file    Attends meetings of clubs or organizations: Not on file    Relationship status: Not on file  . Intimate partner violence:    Fear of current or ex partner: Not on file    Emotionally abused: Not on file    Physically abused: Not on file    Forced sexual activity: Not on file  Other Topics Concern  . Not on file  Social History Narrative  . Not on file    Family History  Problem Relation Age of Onset  . Colon cancer Mother        died at age 35  . Heart attack Father   . Hepatitis Sister 42       Autoimmune  . Heart attack Paternal Grandfather     Past Medical History:  Diagnosis Date  . CAD (coronary artery disease)    a. 12/2017: cath showing 10% Proximal-LAD stenosis with no significant obstructive disease and LVEDP mildly elevated at 18 mm Hg.   Marland Kitchen Chronic back pain   . DVT (deep venous thrombosis) ([place])   . Essential hypertension   . Morbid obesity ([place])   . Panic disorder   . Prediabetes   . Shoulder pain, left    Following fall    Past Surgical History:  Procedure Laterality Date  . Ankle fracture sugery Left   . COLONOSCOPY WITH PROPOFOL N/A 05/29/2017   Procedure: COLONOSCOPY WITH PROPOFOL;  Surgeon: Daneil Dolin, MD;  [facility];  Service: Endoscopy;  Laterality: N/A;  2:00pm  . LEFT HEART CATH AND CORONARY ANGIOGRAPHY N/A 01/23/2018   Procedure: LEFT HEART CATH AND CORONARY ANGIOGRAPHY;  Surgeon: Troy Sine, MD;  Location: [facility];  Service: Cardiovascular;  Laterality: N/A;  . LUMBAR SPINE SURGERY     fusion  . POLYPECTOMY  05/29/2017   Procedure: POLYPECTOMY;   Surgeon: Daneil Dolin, MD;  Location: [facility];  Service: Endoscopy;;  ascending colon polyp x5-cs transverse colon polyp x4- cs descending colon polyp x1- hs sigmoid colon polyp x1 -hs rectal polyp x1- hs  .  TONSILLECTOMY      Current Outpatient Medications  Medication Sig Dispense Refill  . cyclobenzaprine (FLEXERIL) 10 MG tablet TAKE 1 TABLET BY MOUTH THREE TIMES DAILY AS NEEDED (Patient taking differently: Take 10 mg by mouth 3 (three) times daily as needed for muscle spasms. ) 90 tablet 0  . lisinopril-hydrochlorothiazide (PRINZIDE,ZESTORETIC) 20-25 MG tablet Take 1 tablet by mouth daily. 90 tablet 3  . metoprolol succinate (TOPROL-XL) 50 MG 24 hr tablet Take 1 tablet (50 mg total) by mouth daily. Take with or immediately following a meal. 90 tablet 3  . oxyCODONE-acetaminophen (PERCOCET) 10-325 MG tablet Take 1 tablet by mouth every 8 (eight) hours as needed for pain. 90 tablet 0  . rivaroxaban (XARELTO) 20 MG TABS tablet Take 1 tablet (20 mg total) by mouth daily with supper. 30 tablet 3  . terbinafine (LAMISIL) 1 % cream Apply 1 application topically 2 (two) times daily as needed (FOR ITCHY SKIN.). 30 g 5   No current facility-administered medications for this visit.     Allergies as of 06/05/2018 - Review Complete 06/05/2018  Allergen Reaction Noted  . Ibuprofen Other (See Comments) 11/20/2013  . Diflucan [fluconazole] Rash 01/19/2018  . Losartan Rash 03/17/2016    Vitals: BP 122/84   Pulse 84   Ht 6' (1.829 m)   Wt (!) 382 lb (173.3 kg)   BMI 51.81 kg/m  Last Weight:  Wt Readings from Last 1 Encounters:  06/05/18 (!) 382 lb (173.3 kg)   WPY:KDXI mass index is 51.81 kg/m.     Last Height:   Ht Readings from Last 1 Encounters:  06/05/18 6' (1.829 m)    Physical exam:  General: The patient is awake, alert and appears not in acute distress. The patient is super obese.  Head: Normocephalic, atraumatic. Neck is supple. Mallampati: 4,  neck circumference:17.  5". Nasal airflow: patent ,  high grade Retrognathia is seen.  Cardiovascular:  Regular rate and rhythm , without  murmurs or carotid bruit, and without distended neck veins. Respiratory: Lungs are clear to auscultation. Skin:  Without evidence of edema, or rash Trunk: BMI is 52 kg/m2  The patient's posture is slouched.   Neurologic exam : The patient is awake and alert, oriented to place and time.   Memory subjective  described as intact.   Attention span & concentration ability appears normal.  Speech is fluent, without dysarthria, dysphonia or aphasia.  Mood and affect are depressed.  Cranial nerves: Pupils are equal and briskly reactive to light. . Extraocular movements  in vertical and horizontal planes intact and without nystagmus. Visual fields by finger perimetry are intact. Hearing to finger rub intact.   Facial sensation intact to fine touch.  Facial motor strength is ssymmetric with a lower left facial droop- and tongue  moves midline. Shoulder shrug was symmetrical.   Motor exam:   Elevated biceps  tone, equal muscle bulk , has rotator cuff injury on the left ( several years) , he has weak left sided grip.  Sensory:  Fine touch, pinprick and vibration were tested in all extremities. Proprioception - pronator drift on the left.  Left hand reduced vibration and grip.   Coordination: Rapid alternating movements in the fingers/hands was normal. Finger-to-nose maneuver with dysmetria on the left,   Gait and station: Patient walks without assistive device . Deep tendon reflexes: in the  upper and lower extremities are symmetrically attenuated and intact.  Babinski maneuver response is down going/ equivocal.    I reviewed  Bradley Torres laboratory results and his 02-27-18 labs show low potassium critically low at 3.1, high BUN 26 indicating dehydration, creatinine high at 1.86 indicating CKD stage III,   White blood cell count was 4.09 low hemoglobin 12.8 low . 4wk ago (05/08/18) 27mo  ago (03/02/18) 73mo ago (03/01/18) 69mo ago (02/28/18) 49mo ago (02/27/18) 20mo ago (02/01/18) 75mo ago (01/23/18)    Glucose 65 - 99 mg/dL 161High   103High  R 102High  R 119High  R 135High  R 125High   119High  R  BUN 6 - 24 mg/dL 19  16 R 21High  R 25High  R 26High  R 18  18 R  Creatinine, Ser 0.76 - 1.27 mg/dL 1.59High   1.42High  R 1.49High  R 1.66High  R 1.86High  R 1.60High   1.52High  R  GFR calc non Af Amer >59 mL/min/1.73 48Low   55Low  R 52Low  R 45Low  R 40Low  R 47Low   50Low  R  GFR calc Af Amer >59 mL/min/1.73 55Low   >60 R 60Low  R 53Low  R 46Low  R 55Low   57Low  R, CM  BUN/Creatinine Ratio 9 - 20 12      11     Sodium 134 - 144 mmol/L 139  139 R 140 R 139 R 138 R 138  139 R  Potassium 3.5 - 5.2 mmol/L 3.8  2.9Low  R, CM 3.5 R 3.9 R, CM 3.1Low  R 4.1  4.3 R  Chloride 96 - 106 mmol/L 97  102 R 103 R 103 R 101 R 98  111 R  CO2 20 - 29 mmol/L 24  25 R 29 R 28 R 26 R 23  23 R  Calcium 8.7 - 10.2 mg/dL 9.5  9.0 R 8.8Low  R 8.6Low  R 9.0 R 9.5  8.6Low  R  Resulting Agency  [facility] [place] [facility] [place] [facility]    Narrative  Performed by: [facility]  Performed at: [facility]       Assessment:  After physical and neurologic examination, review of laboratory studies,  Personal review of imaging studies, reports of other /same  Imaging studies, results of polysomnography and / or neurophysiology testing and pre-existing records as far as provided in visit., my assessment is:   1)  Status post PE- and 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  There is a plethora of comorbidities, Bradley Torres has been severely deconditioned, at this stage she is considered super obese, he has developed kidney failure, elevated blood sugars, hypertension, and he will be on chronic anticoagulation.  The patient needs a sleep study and I appreciate Dr Gustavus Bryant assessment and diagnosis of oxygen dependency. I encouraged Bradley Torres to use oxygen at night even before he has the sleep study.    2) attended sleep study ordered, SPLIT. I will order a brain MRI, too . I suspect a right brain stroke.   3) Patient likely need o2 here at night. Consider referral to medical weight management and nephrology.   The patient was advised of the nature of the diagnosed disorder , the treatment options and the  risks for general health and wellness arising from not treating the condition.   I spent more than 55 minutes of face to face time with the patient.  Greater than 50% of time was spent in counseling and coordination of care. We have discussed the diagnosis and differential and  I answered the patient's questions.    RV after sleep study . Larey Seat, MD 6/62/9476, 5:46 PM  Certified in Neurology by ABPN Certified in [place] by [facility] [address] [place], [place] [address]

## 2018-06-06 ENCOUNTER — Telehealth: Payer: Self-pay | Admitting: Neurology

## 2018-06-06 NOTE — Telephone Encounter (Signed)
medicaid order sent to [facility]. They will obtain the auth and reach out to the pt to schedule.  °

## 2018-06-16 ENCOUNTER — Ambulatory Visit
Admission: RE | Admit: 2018-06-16 | Discharge: 2018-06-16 | Disposition: A | Discharge status: Home / Self Care | Payer: Medicaid Other | Source: Ambulatory Visit | Attending: Neurology | Admitting: Neurology

## 2018-06-16 ENCOUNTER — Other Ambulatory Visit: Payer: Self-pay

## 2018-06-16 DIAGNOSIS — I214 Non-ST elevation (NSTEMI) myocardial infarction: Secondary | ICD-10-CM

## 2018-06-16 DIAGNOSIS — Z6841 Body Mass Index (BMI) 40.0 and over, adult: Secondary | ICD-10-CM

## 2018-06-16 DIAGNOSIS — Z72821 Inadequate sleep hygiene: Secondary | ICD-10-CM | POA: Diagnosis not present

## 2018-06-16 DIAGNOSIS — I5032 Chronic diastolic (congestive) heart failure: Secondary | ICD-10-CM

## 2018-06-16 DIAGNOSIS — N183 Chronic kidney disease, stage 3 unspecified: Secondary | ICD-10-CM

## 2018-06-16 DIAGNOSIS — J9621 Acute and chronic respiratory failure with hypoxia: Secondary | ICD-10-CM

## 2018-06-16 DIAGNOSIS — I2782 Chronic pulmonary embolism: Secondary | ICD-10-CM

## 2018-06-16 DIAGNOSIS — R29810 Facial weakness: Secondary | ICD-10-CM | POA: Diagnosis not present

## 2018-06-16 DIAGNOSIS — I639 Cerebral infarction, unspecified: Secondary | ICD-10-CM

## 2018-06-16 DIAGNOSIS — I2692 Saddle embolus of pulmonary artery without acute cor pulmonale: Secondary | ICD-10-CM

## 2018-06-16 DIAGNOSIS — M67912 Unspecified disorder of synovium and tendon, left shoulder: Secondary | ICD-10-CM

## 2018-06-19 NOTE — Telephone Encounter (Signed)
Medicaid Josem Kaufmann: [id number] (exp. 06/14/18 to 07/14/18) patient had MRI at [facility] on 06/16/18

## 2018-06-21 ENCOUNTER — Telehealth: Payer: Self-pay | Admitting: Neurology

## 2018-06-21 NOTE — Progress Notes (Signed)
Chart reviewed, patient with echo ordered for next week. Nonurgent indication. I contacted patient and discussed with him, we will reschedule test for May 2020   Zandra Abts MD

## 2018-06-21 NOTE — Telephone Encounter (Signed)
Called the patient to review the MRI, no answer LVM informing the patient to call back.

## 2018-06-21 NOTE — Telephone Encounter (Signed)
-----   Message from Larey Seat, MD sent at 06/19/2018  1:32 PM EDT ----- IMPRESSION: This MRI of the brain without contrast shows the following: 1.    Right posterior frontal focus involving gray and white matter and  associated with chronic heme products.  This is most consistent with a  remote infarction though focal trauma might also have a similar  appearance. 2.    Minimal chronic microvascular ischemic changes in the cerebellum and  hemispheres. 3.    Right maxillary chronic sinusitis. 4.    There are no acute findings.  Finding is consistent with recovery from a brain bleed right post. frontal area. Previous stroke or trauma can be a cause- this is remote , not acute. 2-3 month minimum time elapsed.   Larey Seat, MD

## 2018-06-27 ENCOUNTER — Other Ambulatory Visit (HOSPITAL_COMMUNITY): Payer: Medicaid Other

## 2018-07-21 ENCOUNTER — Other Ambulatory Visit: Payer: Self-pay | Admitting: Physician Assistant

## 2018-08-01 ENCOUNTER — Other Ambulatory Visit: Payer: Self-pay | Admitting: Physician Assistant

## 2018-08-01 DIAGNOSIS — M545 Low back pain, unspecified: Secondary | ICD-10-CM

## 2018-08-01 DIAGNOSIS — G894 Chronic pain syndrome: Secondary | ICD-10-CM

## 2018-08-01 DIAGNOSIS — G8929 Other chronic pain: Secondary | ICD-10-CM

## 2018-08-06 ENCOUNTER — Telehealth: Payer: Self-pay | Admitting: Physician Assistant

## 2018-08-07 ENCOUNTER — Encounter: Payer: Self-pay | Admitting: Physician Assistant

## 2018-08-07 ENCOUNTER — Ambulatory Visit (INDEPENDENT_AMBULATORY_CARE_PROVIDER_SITE_OTHER): Payer: Medicaid Other | Admitting: Physician Assistant

## 2018-08-07 ENCOUNTER — Other Ambulatory Visit: Payer: Self-pay

## 2018-08-07 DIAGNOSIS — G894 Chronic pain syndrome: Secondary | ICD-10-CM | POA: Diagnosis not present

## 2018-08-07 DIAGNOSIS — M170 Bilateral primary osteoarthritis of knee: Secondary | ICD-10-CM

## 2018-08-07 DIAGNOSIS — I10 Essential (primary) hypertension: Secondary | ICD-10-CM | POA: Diagnosis not present

## 2018-08-07 DIAGNOSIS — M75122 Complete rotator cuff tear or rupture of left shoulder, not specified as traumatic: Secondary | ICD-10-CM

## 2018-08-07 MED ORDER — OXYCODONE-ACETAMINOPHEN 10-325 MG PO TABS: 1.0000 | ORAL_TABLET | Freq: Three times a day (TID) | ORAL | 0 refills | Status: DC | PRN

## 2018-08-07 MED ORDER — LISINOPRIL-HYDROCHLOROTHIAZIDE 20-25 MG PO TABS: 1.0000 | ORAL_TABLET | Freq: Every day | ORAL | 3 refills | Status: DC

## 2018-08-07 MED ORDER — RIVAROXABAN 20 MG PO TABS: ORAL_TABLET | ORAL | 11 refills | Status: DC

## 2018-08-07 NOTE — Progress Notes (Signed)
Telephone visit  Subjective: CC: Hypertension and chronic pain checkup PCP: Terald Sleeper, PA-C Bradley Torres is a 58 y.o. male calls for telephone consult today. Patient provides verbal consent for consult held via phone.  Patient is identified with 2 separate identifiers.  At this time the entire area is on COVID-19 social distancing and stay home orders are in place.  Patient is of higher risk and therefore we are performing this by a virtual method.  Location of patient: Home Location of provider: [facility] Others present for call: No  The patient is at home for this visit and he explains that he did have an MRI performed by Dr. Brett Fairy.  He is awaiting another visit with her.  Due to the COVID-19 restrictions a lot of their appointments had to be postponed.  I given him the telephone number to give them a call back so that it does not slip through the cracks.  They were going to evaluate also for obstructive sleep apnea.  Patient's blood pressure has been very good and under control.  He does need some refills on his standard medications.   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 Moderately severe osteoarthritis with a small joint effusion.  This patient returns for a 3 month recheck on narcotic use for the above named conditions  Current medications-Percocet 10/325 1 every 8 hours as needed for severe pain Flexeril as needed for muscle spasm Medication side effects-no Any concerns-no  Pain on scale of 1-10-7 Frequency-Daily What increases pain-walking What makes pain Better-rest Effects on ADL -mild Any change in general medical condition-no  Effectiveness of current meds-good Adverse reactions form pain  meds-no PMP AWARE website reviewed: Yes Any suspicious activity on PMP Aware: No MME daily dose: 45  Contract on file 02/08/18 Last UDS  05/08/18  History of overdose or risk of abuse no    ROS: Per HPI  Allergies  Allergen Reactions  . Ibuprofen Other (See Comments)    Rectal bleed  . Diflucan [Fluconazole] Rash  . Losartan Rash   Past Medical History:  Diagnosis Date  . CAD (coronary artery disease)    a. 12/2017: cath showing 10% Proximal-LAD stenosis with no significant obstructive disease and LVEDP mildly elevated at 18 mm Hg.   Marland Kitchen Chronic back pain   . DVT (deep venous thrombosis) ([place])   . Essential hypertension   . Morbid obesity ([place])   . Panic disorder   . Prediabetes   . Shoulder pain, left    Following fall    Current Outpatient Medications:  .  cyclobenzaprine (FLEXERIL) 10 MG tablet, TAKE 1 TABLET BY MOUTH THREE TIMES DAILY AS NEEDED (Patient taking differently: Take 10 mg by mouth 3 (three) times daily as needed for muscle spasms. ), Disp: 90 tablet, Rfl: 0 .  lisinopril-hydrochlorothiazide (ZESTORETIC) 20-25 MG tablet, Take 1 tablet by mouth daily., Disp: 90 tablet, Rfl: 3 .  metoprolol succinate (TOPROL-XL) 50 MG 24 hr tablet, Take 1 tablet (50 mg total) by mouth daily. Take with or immediately following a meal., Disp: 90 tablet, Rfl: 3 .  oxyCODONE-acetaminophen (PERCOCET) 10-325 MG tablet, Take 1 tablet by mouth every 8 (eight) hours as needed for pain., Disp: 90 tablet, Rfl: 0 .  oxyCODONE-acetaminophen (PERCOCET) 10-325 MG tablet,  Take 1 tablet by mouth every 8 (eight) hours as needed for pain., Disp: 90 tablet, Rfl: 0 .  oxyCODONE-acetaminophen (PERCOCET) 10-325 MG tablet, Take 1 tablet by mouth every 8 (eight) hours as needed for pain., Disp: 90 tablet, Rfl: 0 .  rivaroxaban (XARELTO) 20 MG TABS tablet, TAKE 1 TABLET BY MOUTH DAILY WITH SUPPER, Disp: 30 tablet, Rfl: 11 .  terbinafine (LAMISIL) 1 % cream, Apply 1 application topically 2 (two) times daily  as needed (FOR ITCHY SKIN.)., Disp: 30 g, Rfl: 5  Assessment/ Plan: 58 y.o. male   1. Primary osteoarthritis of both knees - oxyCODONE-acetaminophen (PERCOCET) 10-325 MG tablet; Take 1 tablet by mouth every 8 (eight) hours as needed for pain.  Dispense: 90 tablet; Refill: 0 - oxyCODONE-acetaminophen (PERCOCET) 10-325 MG tablet; Take 1 tablet by mouth every 8 (eight) hours as needed for pain.  Dispense: 90 tablet; Refill: 0 - oxyCODONE-acetaminophen (PERCOCET) 10-325 MG tablet; Take 1 tablet by mouth every 8 (eight) hours as needed for pain.  Dispense: 90 tablet; Refill: 0  2. Chronic pain syndrome - oxyCODONE-acetaminophen (PERCOCET) 10-325 MG tablet; Take 1 tablet by mouth every 8 (eight) hours as needed for pain.  Dispense: 90 tablet; Refill: 0 - oxyCODONE-acetaminophen (PERCOCET) 10-325 MG tablet; Take 1 tablet by mouth every 8 (eight) hours as needed for pain.  Dispense: 90 tablet; Refill: 0 - oxyCODONE-acetaminophen (PERCOCET) 10-325 MG tablet; Take 1 tablet by mouth every 8 (eight) hours as needed for pain.  Dispense: 90 tablet; Refill: 0  3. Essential hypertension - lisinopril-hydrochlorothiazide (ZESTORETIC) 20-25 MG tablet; Take 1 tablet by mouth daily.  Dispense: 90 tablet; Refill: 3  4. Nontraumatic complete tear of left rotator cuff - oxyCODONE-acetaminophen (PERCOCET) 10-325 MG tablet; Take 1 tablet by mouth every 8 (eight) hours as needed for pain.  Dispense: 90 tablet; Refill: 0 - oxyCODONE-acetaminophen (PERCOCET) 10-325 MG tablet; Take 1 tablet by mouth every 8 (eight) hours as needed for pain.  Dispense: 90 tablet; Refill: 0 - oxyCODONE-acetaminophen (PERCOCET) 10-325 MG tablet; Take 1 tablet by mouth every 8 (eight) hours as needed for pain.  Dispense: 90 tablet; Refill: 0   Start time: 10:51 AM End time: 11:02 AM  Meds ordered this encounter  Medications  . oxyCODONE-acetaminophen (PERCOCET) 10-325 MG tablet    Sig: Take 1 tablet by mouth every 8 (eight) hours as  needed for pain.    Dispense:  90 tablet    Refill:  0    Order Specific Question:   Supervising Provider    Answer:   Janora Norlander [[id number]]  . oxyCODONE-acetaminophen (PERCOCET) 10-325 MG tablet    Sig: Take 1 tablet by mouth every 8 (eight) hours as needed for pain.    Dispense:  90 tablet    Refill:  0    Fill 30 days from original script date    Order Specific Question:   Supervising Provider    Answer:   Janora Norlander [[id number]]  . oxyCODONE-acetaminophen (PERCOCET) 10-325 MG tablet    Sig: Take 1 tablet by mouth every 8 (eight) hours as needed for pain.    Dispense:  90 tablet    Refill:  0    Fill 60 days from original script date    Order Specific Question:   Supervising Provider    Answer:   Janora Norlander [[id number]]  . rivaroxaban (XARELTO) 20 MG TABS tablet    Sig: TAKE 1 TABLET BY MOUTH DAILY WITH SUPPER    Dispense:  30  tablet    Refill:  11    Order Specific Question:   Supervising Provider    Answer:   Janora Norlander [[id number]]  . lisinopril-hydrochlorothiazide (ZESTORETIC) 20-25 MG tablet    Sig: Take 1 tablet by mouth daily.    Dispense:  90 tablet    Refill:  3    Order Specific Question:   Supervising Provider    Answer:   Janora Norlander [[id number]]    [name] PA-C [place] [phone]

## 2018-09-12 ENCOUNTER — Ambulatory Visit (INDEPENDENT_AMBULATORY_CARE_PROVIDER_SITE_OTHER): Payer: Medicaid Other | Admitting: Neurology

## 2018-09-12 DIAGNOSIS — Z72821 Inadequate sleep hygiene: Secondary | ICD-10-CM

## 2018-09-12 DIAGNOSIS — N183 Chronic kidney disease, stage 3 unspecified: Secondary | ICD-10-CM

## 2018-09-12 DIAGNOSIS — I214 Non-ST elevation (NSTEMI) myocardial infarction: Secondary | ICD-10-CM

## 2018-09-12 DIAGNOSIS — I2692 Saddle embolus of pulmonary artery without acute cor pulmonale: Secondary | ICD-10-CM

## 2018-09-12 DIAGNOSIS — I5032 Chronic diastolic (congestive) heart failure: Secondary | ICD-10-CM

## 2018-09-12 DIAGNOSIS — I2782 Chronic pulmonary embolism: Secondary | ICD-10-CM

## 2018-09-12 DIAGNOSIS — G4733 Obstructive sleep apnea (adult) (pediatric): Secondary | ICD-10-CM

## 2018-09-12 DIAGNOSIS — J9621 Acute and chronic respiratory failure with hypoxia: Secondary | ICD-10-CM

## 2018-09-12 DIAGNOSIS — G4734 Idiopathic sleep related nonobstructive alveolar hypoventilation: Secondary | ICD-10-CM

## 2018-09-12 DIAGNOSIS — M67912 Unspecified disorder of synovium and tendon, left shoulder: Secondary | ICD-10-CM

## 2018-09-18 ENCOUNTER — Telehealth: Payer: Self-pay | Admitting: Neurology

## 2018-09-18 NOTE — Telephone Encounter (Signed)
-----   Message from Larey Seat, MD sent at 09/18/2018 12:37 PM EDT ----- 1. Obstructive Sleep Apnea (OSA), moderate- severe degree ( AHI 33.2/h)  with prolonged hypoxemia.  2. Snoring  RECOMMENDATIONS:  1. Advise full-night, attended, CPAP titration study to optimize therapy allowing oxygen titration if needed.     I certify that I have reviewed the entire raw data recording prior to the issuance of this report in accordance with the Standards of Accreditation of the [place] [employer] (AASM)   Larey Seat, MD    09-18-2018

## 2018-09-18 NOTE — Telephone Encounter (Signed)
Called patient to discuss sleep study results. No answer at this time. LVM for the patient to call back.   

## 2018-09-18 NOTE — Procedures (Signed)
PATIENT'S NAME:  Bradley Torres, Bradley Torres DOB:      07/21/1960      MR#:    [id number]     DATE OF RECORDING: 09/12/2018 REFERRING M.D.:  [name] PA-C Study Performed:   Baseline Polysomnogram HISTORY:  58 y.o. 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 Now on Xeralto. He is seen here on 06-05-2018 as in a referral from [place] for a sleep evaluation. Mr. Schrieber had been evaluated for chest pain but no cardiac reason was identified.  While he was hospitalized for pulmonary embolism at [facility] and he was hospitalized from October 2019 - for 5 days, had cardiac catheterization. He had PE and CVA on Thanksgiving.  The patient is super-obese, deconditioned and has CKD grade 3; borderline DM and HTN. He was seen by [facility] pulmonologist Dr. Melvyn Novas who ordered oxygen after an ONO which was delivered to his house. Mr. Detwiler reportedly rejected the oxygen as he wanted to see Sleep- Doc first. He never had a sleep study.    Chief complaint according to patient: The patient states that his sleep has not changed following the pulmonary embolism, but he was well aware of a sense of being doomed a great anxiety provoking sensation when he suffered the pulmonary embolism he also became limp and clumsy in his left hand and arm.  And he has a slight left facial droop.  He is aware that he most likely suffered a stroke at the same time that he suffered a pulmonary embolism.   It is racing mind that keeps him from going back to sleep. He estimates 5 to 6 hours of sleep in total but broken up into many short sleep phases. Rises at lunchtime, 12 noon. He feels rested.    The patient endorsed the Epworth Sleepiness Scale at 14 points.   The patient's weight 381 pounds with a height of 72 (inches), resulting in a BMI of 51.7 kg/m2. The patient's neck circumference measured 17.5 inches.  CURRENT MEDICATIONS: Flexeril, Prinzide, Toprol, Percocet, Xarelto, Lamisil   PROCEDURE:  This is a multichannel digital polysomnogram utilizing the Somnostar 11.2 system.  Electrodes and sensors were applied and monitored per AASM Specifications.   EEG, EOG, Chin and Limb EMG, were sampled at 200 Hz.  ECG, Snore and Nasal Pressure, Thermal Airflow, Respiratory Effort, CPAP Flow and Pressure, Oximetry was sampled at 50 Hz. Digital video and audio were recorded.      BASELINE STUDY: Lights Out was at 22:25 and Lights On at 04:59.  Total recording time (TRT) was 394 minutes, with a total sleep time (TST) of 264 minutes.   The patient's sleep latency was 46.5 minutes.  REM latency was 352 minutes.  The sleep efficiency was 67.1 %.     SLEEP ARCHITECTURE: WASO (Wake after sleep onset) was 111.5 minutes.  There were 68.5 minutes in Stage N1, 93.5 minutes Stage N2, 94.5 minutes Stage N3 and 7.5 minutes in Stage REM.  The percentage of Stage N1 was 25.9%, Stage N2 was 35.4%, Stage N3 was 35.8% and Stage R (REM sleep) was 2.8%.     RESPIRATORY ANALYSIS:  There were a total of 146 respiratory events:  68 obstructive apneas, 0 central apneas and 0 mixed apneas with a total of 68 apneas and an apnea index (AI) of 15.5 /hour. There were 78 hypopneas with a hypopnea index of 17.7 /hour.    The total APNEA/HYPOPNEA INDEX (AHI)  was 33.2 /hour.  15 events occurred in REM sleep and 156 events in NREM. The REM AHI was 120 /hour, versus a non-REM AHI of 30.6. The patient spent 70.5 minutes of total sleep time in the supine position and 194 minutes in non-supine position. The supine AHI was 69.8 versus a non-supine AHI of 19.8.  OXYGEN SATURATION & C02:  The Wake baseline 02 saturation was 92%, with the lowest being 82%. Time spent below 89% saturation equaled 138 minutes.  The arousals were noted as: 47 were spontaneous, 0 were associated with PLMs, and 95 were associated with respiratory events. The patient had a total of 0 Periodic Limb Movements.   Audio and video analysis did not show any  abnormal or unusual movements, behaviors, phonations or vocalizations.   The patient had no bathroom breaks. Snoring was noted. EKG was regular (NSR). Post-study, the patient indicated that sleep was the same as usual.   IMPRESSION:  1. Obstructive Sleep Apnea (OSA), moderate- severe degree with prolonged hypoxemia.  2. Snoring  RECOMMENDATIONS:  1. Advise full-night, attended, CPAP titration study to optimize therapy ,allowing  oxygen titration if needed.     I certify that I have reviewed the entire raw data recording prior to the issuance of this report in accordance with the Standards of Accreditation of the [place] [employer] (AASM)   Larey Seat, MD    09-18-2018 Diplomat, American Board of Psychiatry and Neurology  Diplomat, American Board of [place] Director, [place] [facility] [employer]

## 2018-09-18 NOTE — Addendum Note (Signed)
Addended by: Larey Seat on: 09/18/2018 12:39 PM   Modules accepted: Orders

## 2018-09-19 ENCOUNTER — Encounter: Payer: Self-pay | Admitting: Neurology

## 2018-09-19 NOTE — Telephone Encounter (Signed)
I called pt. I advised pt that Dr. Dohmeier reviewed their sleep study results and found that has moderate to severe sleep apnea and recommends that pt be treated with a cpap. Dr. Dohmeier recommends that pt return for a repeat sleep study in order to properly titrate the cpap and ensure a good mask fit. Pt is agreeable to returning for a titration study. I advised pt that our sleep lab will file with pt's insurance and call pt to schedule the sleep study when we hear back from the pt's insurance regarding coverage of this sleep study. Pt verbalized understanding of results. Pt had no questions at this time but was encouraged to call back if questions arise.   

## 2018-09-19 NOTE — Telephone Encounter (Signed)
Pt returning call please call back °

## 2018-10-05 ENCOUNTER — Other Ambulatory Visit: Payer: Self-pay

## 2018-10-05 ENCOUNTER — Ambulatory Visit (INDEPENDENT_AMBULATORY_CARE_PROVIDER_SITE_OTHER): Payer: Medicaid Other | Admitting: Physician Assistant

## 2018-10-05 DIAGNOSIS — M75122 Complete rotator cuff tear or rupture of left shoulder, not specified as traumatic: Secondary | ICD-10-CM

## 2018-10-05 DIAGNOSIS — Z7901 Long term (current) use of anticoagulants: Secondary | ICD-10-CM

## 2018-10-05 DIAGNOSIS — I10 Essential (primary) hypertension: Secondary | ICD-10-CM

## 2018-10-05 DIAGNOSIS — G894 Chronic pain syndrome: Secondary | ICD-10-CM

## 2018-10-05 DIAGNOSIS — M170 Bilateral primary osteoarthritis of knee: Secondary | ICD-10-CM | POA: Diagnosis not present

## 2018-10-05 MED ORDER — OXYCODONE-ACETAMINOPHEN 10-325 MG PO TABS: 1.0000 | ORAL_TABLET | Freq: Three times a day (TID) | ORAL | 0 refills | Status: DC | PRN

## 2018-10-08 ENCOUNTER — Encounter: Payer: Self-pay | Admitting: Physician Assistant

## 2018-10-08 ENCOUNTER — Telehealth: Payer: Self-pay | Admitting: Cardiovascular Disease

## 2018-10-08 ENCOUNTER — Telehealth: Payer: Self-pay | Admitting: *Deleted

## 2018-10-08 NOTE — Telephone Encounter (Addendum)
Prior Auth for Percocet 10/325mg  -APPROVED expires 04/06/2019   PA# [id number]     [employer] Short Acting Opioid form filled out and faxed with OV notes

## 2018-10-08 NOTE — Progress Notes (Signed)
Telephone visit  Subjective: [id number] up on medical conditions PCP: Terald Sleeper, PA-C Bradley Torres is a 58 y.o. male calls for telephone consult today. Patient provides verbal consent for consult held via phone.  Patient is identified with 2 separate identifiers.  At this time the entire area is on COVID-19 social distancing and stay home orders are in place.  Patient is of higher risk and therefore we are performing this by a virtual method.  Location of patient: home Location of provider: [facility] Others present for call: no  The patient is having a telephone visit for a recheck on his chronic pain.  He is doing very well and lowering the medication.  He has been doing very well overall.  He does have follow-ups with his specialist.  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 Moderately severe osteoarthritis with a small joint effusion.  This patient returns for a 3 month recheck on narcotic use for the above named conditions  Current medications-Percocet 10/325 1 every 8 hours as needed for severe pain Flexeril as needed for muscle spasm Medication side effects-no Any concerns-no  Pain on scale of 1-10-7 Frequency-Daily What increases pain-walking What makes pain Better-rest Effects on ADL -mild Any change in general medical condition-no  Effectiveness of current meds-good Adverse reactions form pain meds-no PMP AWARE website reviewed: Yes Any suspicious activity on PMP Aware: No MME daily dose: 45  Contract on file 02/08/18 Last UDS  05/08/18  History of overdose or risk of abuse no ROS: Per HPI  Allergies  Allergen Reactions  . Ibuprofen Other (See Comments)    Rectal bleed  . Diflucan  [Fluconazole] Rash  . Losartan Rash   Past Medical History:  Diagnosis Date  . CAD (coronary artery disease)    a. 12/2017: cath showing 10% Proximal-LAD stenosis with no significant obstructive disease and LVEDP mildly elevated at 18 mm Hg.   Marland Kitchen Chronic back pain   . DVT (deep venous thrombosis) ([place])   . Essential hypertension   . Morbid obesity ([place])   . Panic disorder   . Prediabetes   . Shoulder pain, left    Following fall    Current Outpatient Medications:  .  cyclobenzaprine (FLEXERIL) 10 MG tablet, TAKE 1 TABLET BY MOUTH THREE TIMES DAILY AS NEEDED (Patient taking differently: Take 10 mg by mouth 3 (three) times daily as needed for muscle spasms. ), Disp: 90 tablet, Rfl: 0 .  lisinopril-hydrochlorothiazide (ZESTORETIC) 20-25 MG tablet, Take 1 tablet by mouth daily., Disp: 90 tablet, Rfl: 3 .  metoprolol succinate (TOPROL-XL) 50 MG 24 hr tablet, Take 1 tablet (50 mg total) by mouth daily. Take with or immediately following a meal., Disp: 90 tablet, Rfl: 3 .  oxyCODONE-acetaminophen (PERCOCET) 10-325 MG tablet, Take 1 tablet by mouth every 8 (eight) hours as needed for pain., Disp: 90 tablet, Rfl: 0 .  oxyCODONE-acetaminophen (PERCOCET) 10-325 MG tablet, Take 1 tablet by mouth every 8 (eight) hours as needed for pain., Disp: 90 tablet, Rfl: 0 .  oxyCODONE-acetaminophen (PERCOCET) 10-325 MG tablet, Take 1 tablet by mouth every 8 (eight) hours as needed for pain., Disp: 90 tablet, Rfl: 0 .  rivaroxaban (XARELTO) 20 MG TABS tablet, TAKE 1 TABLET BY MOUTH DAILY WITH SUPPER, Disp: 30 tablet, Rfl:  11 .  terbinafine (LAMISIL) 1 % cream, Apply 1 application topically 2 (two) times daily as needed (FOR ITCHY SKIN.)., Disp: 30 g, Rfl: 5  Assessment/ Plan: 58 y.o. male   1. Primary osteoarthritis of both knees - oxyCODONE-acetaminophen (PERCOCET) 10-325 MG tablet; Take 1 tablet by mouth every 8 (eight) hours as needed for pain.  Dispense: 90 tablet; Refill: 0 - oxyCODONE-acetaminophen  (PERCOCET) 10-325 MG tablet; Take 1 tablet by mouth every 8 (eight) hours as needed for pain.  Dispense: 90 tablet; Refill: 0 - oxyCODONE-acetaminophen (PERCOCET) 10-325 MG tablet; Take 1 tablet by mouth every 8 (eight) hours as needed for pain.  Dispense: 90 tablet; Refill: 0  2. Chronic pain syndrome - oxyCODONE-acetaminophen (PERCOCET) 10-325 MG tablet; Take 1 tablet by mouth every 8 (eight) hours as needed for pain.  Dispense: 90 tablet; Refill: 0 - oxyCODONE-acetaminophen (PERCOCET) 10-325 MG tablet; Take 1 tablet by mouth every 8 (eight) hours as needed for pain.  Dispense: 90 tablet; Refill: 0 - oxyCODONE-acetaminophen (PERCOCET) 10-325 MG tablet; Take 1 tablet by mouth every 8 (eight) hours as needed for pain.  Dispense: 90 tablet; Refill: 0  3. Nontraumatic complete tear of left rotator cuff - oxyCODONE-acetaminophen (PERCOCET) 10-325 MG tablet; Take 1 tablet by mouth every 8 (eight) hours as needed for pain.  Dispense: 90 tablet; Refill: 0 - oxyCODONE-acetaminophen (PERCOCET) 10-325 MG tablet; Take 1 tablet by mouth every 8 (eight) hours as needed for pain.  Dispense: 90 tablet; Refill: 0 - oxyCODONE-acetaminophen (PERCOCET) 10-325 MG tablet; Take 1 tablet by mouth every 8 (eight) hours as needed for pain.  Dispense: 90 tablet; Refill: 0  4. Benign essential HTN - CBC with Differential/Platelet; Future - CMP14+EGFR; Future - Lipid panel; Future - TSH; Future  5. Anticoagulated by anticoagulation treatment - CBC with Differential/Platelet; Future - CMP14+EGFR; Future11:16 AM - Lipid panel; Future - TSH; Future   No follow-ups on file.  Continue all other maintenance medications as listed above.  Start time: 11:16 AM End time: 11:28 AM  Meds ordered this encounter  Medications  . oxyCODONE-acetaminophen (PERCOCET) 10-325 MG tablet    Sig: Take 1 tablet by mouth every 8 (eight) hours as needed for pain.    Dispense:  90 tablet    Refill:  0    Order Specific Question:    Supervising Provider    Answer:   Janora Norlander [[id number]]  . oxyCODONE-acetaminophen (PERCOCET) 10-325 MG tablet    Sig: Take 1 tablet by mouth every 8 (eight) hours as needed for pain.    Dispense:  90 tablet    Refill:  0    Fill 30 days from original script date    Order Specific Question:   Supervising Provider    Answer:   Janora Norlander [[id number]]  . oxyCODONE-acetaminophen (PERCOCET) 10-325 MG tablet    Sig: Take 1 tablet by mouth every 8 (eight) hours as needed for pain.    Dispense:  90 tablet    Refill:  0    Fill 60 days from original script date    Order Specific Question:   Supervising Provider    Answer:   Janora Norlander [[id number]]    [name] PA-C [place] [phone]

## 2018-10-08 NOTE — Telephone Encounter (Signed)
Virtual Visit Pre-Appointment Phone Call  "(Name), I am calling you today to discuss your upcoming appointment. We are currently trying to limit exposure to the virus that causes COVID-19 by seeing patients at home rather than in the office."  1. "What is the BEST phone number to call the day of the visit?" - include this in appointment notes  2. Do you have or have access to (through a family member/friend) a smartphone with video capability that we can use for your visit?" a. If yes - list this number in appt notes as cell (if different from BEST phone #) and list the appointment type as a VIDEO visit in appointment notes b. If no - list the appointment type as a PHONE visit in appointment notes  3. Confirm consent - "In the setting of the current Covid19 crisis, you are scheduled for a (phone or video) visit with your provider on (date) at (time).  Just as we do with many in-office visits, in order for you to participate in this visit, we must obtain consent.  If you'd like, I can send this to your mychart (if signed up) or email for you to review.  Otherwise, I can obtain your verbal consent now.  All virtual visits are billed to your insurance company just like a normal visit would be.  By agreeing to a virtual visit, we'd like you to understand that the technology does not allow for your provider to perform an examination, and thus may limit your provider's ability to fully assess your condition. If your provider identifies any concerns that need to be evaluated in person, we will make arrangements to do so.  Finally, though the technology is pretty good, we cannot assure that it will always work on either your or our end, and in the setting of a video visit, we may have to convert it to a phone-only visit.  In either situation, we cannot ensure that we have a secure connection.  Are you willing to proceed?" STAFF: Did the patient verbally acknowledge consent to telehealth visit? Document  YES/NO here: yes  4. Advise patient to be prepared - "Two hours prior to your appointment, go ahead and check your blood pressure, pulse, oxygen saturation, and your weight (if you have the equipment to check those) and write them all down. When your visit starts, your provider will ask you for this information. If you have an Apple Watch or Kardia device, please plan to have heart rate information ready on the day of your appointment. Please have a pen and paper handy nearby the day of the visit as well."  5. Give patient instructions for MyChart download to smartphone OR Doximity/Doxy.me as below if video visit (depending on what platform provider is using)  6. Inform patient they will receive a phone call 15 minutes prior to their appointment time (may be from unknown caller ID) so they should be prepared to answer    TELEPHONE CALL NOTE  COLIN ELLERS has been deemed a candidate for a follow-up tele-health visit to limit community exposure during the Covid-19 pandemic. I spoke with the patient via phone to ensure availability of phone/video source, confirm preferred email & phone number, and discuss instructions and expectations.  I reminded ROBI DEWOLFE to be prepared with any vital sign and/or heart rhythm information that could potentially be obtained via home monitoring, at the time of his visit. I reminded ASHYR HEDGEPATH to expect a phone call prior to  his visit.  Weston Anna 10/08/2018 12:26 PM   INSTRUCTIONS FOR DOWNLOADING THE MYCHART APP TO SMARTPHONE  - The patient must first make sure to have activated MyChart and know their login information - If Apple, go to [employer] and type in MyChart in the search bar and download the app. If Android, ask patient to go to [employer] and type in [place] in the search bar and download the app. The app is free but as with any other app downloads, their phone may require them to verify saved payment information or  Apple/Android password.  - The patient will need to then log into the app with their MyChart username and password, and select [facility] as their healthcare provider to link the account. When it is time for your visit, go to the MyChart app, find appointments, and click Begin Video Visit. Be sure to Select Allow for your device to access the Microphone and Camera for your visit. You will then be connected, and your provider will be with you shortly.  **If they have any issues connecting, or need assistance please contact MyChart service desk [phone])**  **If using a computer, in order to ensure the best quality for their visit they will need to use either of the following Internet Browsers: [employer], or Google Chrome**  IF USING DOXIMITY or DOXY.ME - The patient will receive a link just prior to their visit by text.     FULL LENGTH CONSENT FOR TELE-HEALTH VISIT   I hereby voluntarily request, consent and authorize [facility] and its employed or contracted physicians, physician assistants, nurse practitioners or other licensed health care professionals (the Practitioner), to provide me with telemedicine health care services (the Services") as deemed necessary by the treating Practitioner. I acknowledge and consent to receive the Services by the Practitioner via telemedicine. I understand that the telemedicine visit will involve communicating with the Practitioner through live audiovisual communication technology and the disclosure of certain medical information by electronic transmission. I acknowledge that I have been given the opportunity to request an in-person assessment or other available alternative prior to the telemedicine visit and am voluntarily participating in the telemedicine visit.  I understand that I have the right to withhold or withdraw my consent to the use of telemedicine in the course of my care at any time, without affecting my right to future care  or treatment, and that the Practitioner or I may terminate the telemedicine visit at any time. I understand that I have the right to inspect all information obtained and/or recorded in the course of the telemedicine visit and may receive copies of available information for a reasonable fee.  I understand that some of the potential risks of receiving the Services via telemedicine include:   Delay or interruption in medical evaluation due to technological equipment failure or disruption;  Information transmitted may not be sufficient (e.g. poor resolution of images) to allow for appropriate medical decision making by the Practitioner; and/or   In rare instances, security protocols could fail, causing a breach of personal health information.  Furthermore, I acknowledge that it is my responsibility to provide information about my medical history, conditions and care that is complete and accurate to the best of my ability. I acknowledge that Practitioner's advice, recommendations, and/or decision may be based on factors not within their control, such as incomplete or inaccurate data provided by me or distortions of diagnostic images or specimens that may result from electronic transmissions. I  understand that the practice of medicine is not an exact science and that Practitioner makes no warranties or guarantees regarding treatment outcomes. I acknowledge that I will receive a copy of this consent concurrently upon execution via email to the email address I last provided but may also request a printed copy by calling the office of [facility].    I understand that my insurance will be billed for this visit.   I have read or had this consent read to me.  I understand the contents of this consent, which adequately explains the benefits and risks of the Services being provided via telemedicine.   I have been provided ample opportunity to ask questions regarding this consent and the Services and have had  my questions answered to my satisfaction.  I give my informed consent for the services to be provided through the use of telemedicine in my medical care  By participating in this telemedicine visit I agree to the above.

## 2018-10-11 ENCOUNTER — Encounter: Payer: Self-pay | Admitting: Cardiovascular Disease

## 2018-10-11 ENCOUNTER — Telehealth (INDEPENDENT_AMBULATORY_CARE_PROVIDER_SITE_OTHER): Payer: Medicaid Other | Admitting: Cardiovascular Disease

## 2018-10-11 VITALS — Ht 72.0 in | Wt >= 6400 oz

## 2018-10-11 DIAGNOSIS — I272 Pulmonary hypertension, unspecified: Secondary | ICD-10-CM

## 2018-10-11 DIAGNOSIS — R002 Palpitations: Secondary | ICD-10-CM | POA: Diagnosis not present

## 2018-10-11 DIAGNOSIS — I2782 Chronic pulmonary embolism: Secondary | ICD-10-CM

## 2018-10-11 DIAGNOSIS — G4733 Obstructive sleep apnea (adult) (pediatric): Secondary | ICD-10-CM

## 2018-10-11 DIAGNOSIS — I2692 Saddle embolus of pulmonary artery without acute cor pulmonale: Secondary | ICD-10-CM

## 2018-10-11 DIAGNOSIS — I10 Essential (primary) hypertension: Secondary | ICD-10-CM

## 2018-10-11 NOTE — Patient Instructions (Signed)

## 2018-10-11 NOTE — Progress Notes (Signed)
Virtual Visit via Telephone Note   This visit type was conducted due to national recommendations for restrictions regarding the COVID-19 Pandemic (e.g. social distancing) in an effort to limit this patient's exposure and mitigate transmission in our community.  Due to his co-morbid illnesses, this patient is at least at moderate risk for complications without adequate follow up.  This format is felt to be most appropriate for this patient at this time.  The patient did not have access to video technology/had technical difficulties with video requiring transitioning to audio format only (telephone).  All issues noted in this document were discussed and addressed.  No physical exam could be performed with this format.  Please refer to the patient's chart for his  consent to telehealth for [facility].   Date:  10/11/2018   ID:  [name], DOB 1960/09/08, MRN [id number]  Patient Location: Home Provider Location: Office  PCP:  Terald Sleeper, PA-C  Cardiologist:  Kate Sable, MD  Electrophysiologist:  None   Evaluation Performed:  Follow-Up Visit  Chief Complaint: Palpitations  History of Present Illness:    Bradley Torres is a 58 y.o. 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  He is doing well and denies chest pain and palpitations.  He seldom gets short of breath only if he is heavily exerting himself.  He wears a mask when he goes to visit family members.  He denies bleeding problems with Xarelto.  The patient does not have symptoms concerning for COVID-19 infection (fever, chills, cough, or new shortness of breath).    Past Medical History:  Diagnosis Date  . CAD (coronary artery disease)    a. 12/2017: cath showing 10% Proximal-LAD stenosis with no significant obstructive disease and LVEDP mildly elevated at 18 mm Hg.   Marland Kitchen Chronic back pain   . DVT (deep venous thrombosis)  ([place])   . Essential hypertension   . Morbid obesity ([place])   . Panic disorder   . Prediabetes   . Shoulder pain, left    Following fall   Past Surgical History:  Procedure Laterality Date  . Ankle fracture sugery Left   . COLONOSCOPY WITH PROPOFOL N/A 05/29/2017   Procedure: COLONOSCOPY WITH PROPOFOL;  Surgeon: Daneil Dolin, MD;  Location[facility];  Service: Endoscopy;  Laterality: N/A;  2:00pm  . LEFT HEART CATH AND CORONARY ANGIOGRAPHY N/A 01/23/2018   Procedure: LEFT HEART CATH AND CORONARY ANGIOGRAPHY;  Surgeon: Troy Sine, MD;  Location: [facility];  Service: Cardiovascular;  Laterality: N/A;  . LUMBAR SPINE SURGERY     fusion  . POLYPECTOMY  05/29/2017   Procedure: POLYPECTOMY;  Surgeon: Daneil Dolin, MD;  Location: [facility];  Service: Endoscopy;;  ascending colon polyp x5-cs transverse colon polyp x4- cs descending colon polyp x1- hs sigmoid colon polyp x1 -hs rectal polyp x1- hs  . TONSILLECTOMY       Current Meds  Medication Sig  . cyclobenzaprine (FLEXERIL) 10 MG tablet TAKE 1 TABLET BY MOUTH THREE TIMES DAILY AS NEEDED (Patient taking differently: Take 10 mg by mouth 3 (three) times daily as needed for muscle spasms. )  . lisinopril-hydrochlorothiazide (ZESTORETIC) 20-25 MG tablet Take 1 tablet by mouth daily.  . metoprolol succinate (TOPROL-XL) 50 MG 24 hr tablet Take 1 tablet (50 mg total) by mouth daily. Take with or immediately following a meal.  . oxyCODONE-acetaminophen (PERCOCET) 10-325 MG  tablet Take 1 tablet by mouth every 8 (eight) hours as needed for pain.  Marland Kitchen oxyCODONE-acetaminophen (PERCOCET) 10-325 MG tablet Take 1 tablet by mouth every 8 (eight) hours as needed for pain.  Marland Kitchen oxyCODONE-acetaminophen (PERCOCET) 10-325 MG tablet Take 1 tablet by mouth every 8 (eight) hours as needed for pain.  . rivaroxaban (XARELTO) 20 MG TABS tablet TAKE 1 TABLET BY MOUTH DAILY WITH SUPPER  . terbinafine (LAMISIL) 1 % cream Apply 1 application  topically 2 (two) times daily as needed (FOR ITCHY SKIN.).     Allergies:   Ibuprofen, Diflucan [fluconazole], and Losartan   Social History   Tobacco Use  . Smoking status: Never Smoker  . Smokeless tobacco: Never Used  Substance Use Topics  . Alcohol use: Yes    Comment: rare.3  . Drug use: Yes    Types: Marijuana    Comment: occassional     Family Hx: The patient's family history includes Colon cancer in his mother; Heart attack in his father and paternal grandfather; Hepatitis (age of onset: 63) in his sister.  ROS:   Please see the history of present illness.     All other systems reviewed and are negative.   Prior CV studies:   The following studies were reviewed today:  Echocardiogram: 01/23/2018 Study Conclusions  - Left ventricle: The cavity size was normal. Systolic function was normal. The estimated ejection fraction was in the range of 60% to 65%. Wall motion was normal; there were no regional wall motion abnormalities. The study was not technically sufficient to allow evaluation of LV diastolic dysfunction due to atrial fibrillation. - Aortic valve: Trileaflet; normal thickness, mildly calcified leaflets. Valve area (VTI): 2.65 cm^2. Valve area (Vmean): 3.21 cm^2. - Mitral valve: Calcified annulus. - Right ventricle: The cavity size was moderately dilated. Wall thickness was normal. - Right atrium: The atrium was mildly dilated. - Tricuspid valve: There was moderate regurgitation. - Pulmonary arteries: PA peak pressure: 64 mm Hg (S).  Impressions:  - The right ventricular systolic pressure was increased consistent with moderate pulmonary hypertension.  Cardiac Catheterization: 01/23/2018  Prox LAD lesion is 10% stenosed.  The left ventricular ejection fraction is 50-55% by visual estimate.  LV end diastolic pressure is mildly elevated.  The left ventricular systolic function is normal.  No significant coronary  obstructive disease with essentially normal coronary arteries and only mild luminal irregularity of the proximal LAD of 10%; normal left circumflex and normal dominant RCA.  Low normal global LV function with an ejection fraction of 50 to 55% without definitive focal segmental wall motion abnormalities. LVEDP is 18 mmHg.  RECOMMENDATION: Medical therapy. Weight loss is essential. The patient should be evaluated for obstructive sleep apnea.  No indication for antiplatelet therapy at this time.  Echocardiogram: 03/01/2018 Study Conclusions  - Left ventricle: The cavity size was normal. Wall thickness was increased in a pattern of mild LVH. Systolic function was normal. The estimated ejection fraction was in the range of 55% to 60%. Wall motion was normal; there were no regional wall motion abnormalities. Left ventricular diastolic function parameters were normal. - Aortic valve: Mildly calcified annulus. Trileaflet. There was no stenosis. Mean gradient (S): 8 mm Hg. Valve area (VTI): 2.31 cm^2. - Mitral valve: Mildly calcified annulus. - Right ventricle: The cavity size was moderately dilated. Systolic function was mildly reduced. - Right atrium: Central venous pressure (est): 3 mm Hg. - Atrial septum: No defect or patent foramen ovale was identified. - Tricuspid valve: There was  mild regurgitation. - Pulmonary arteries: Systolic pressure was severely increased. PA peak pressure: 87 mm Hg (S). - Pericardium, extracardiac: There was no pericardial effusion.  Labs/Other Tests and Data Reviewed:    EKG:  No ECG reviewed.  Recent Labs: 02/01/2018: ALT 34 02/27/2018: B Natriuretic Peptide 309.0; Magnesium 2.1 03/02/2018: Hemoglobin 14.6; Platelets 271 05/08/2018: BUN 19; Creatinine, Ser 1.59; Potassium 3.8; Sodium 139   Recent Lipid Panel Lab Results  Component Value Date/Time   CHOL 134 02/01/2018 12:02 PM   TRIG 99 02/01/2018 12:02 PM   HDL 35 (L)  02/01/2018 12:02 PM   CHOLHDL 3.8 02/01/2018 12:02 PM   CHOLHDL 5.1 01/20/2018 02:09 AM   LDLCALC 79 02/01/2018 12:02 PM    Wt Readings from Last 3 Encounters:  10/11/18 (!) 400 lb (181.4 kg)  06/05/18 (!) 382 lb (173.3 kg)  05/09/18 (!) 383 lb (173.7 kg)     Objective:    Vital Signs:  Ht 6' (1.829 m)   Wt (!) 400 lb (181.4 kg)   BMI 54.25 kg/m    VITAL SIGNS:  reviewed  ASSESSMENT & PLAN:    1.  Palpitations: Symptomatically stable on Toprol-XL 50 mg daily.  No changes.  2.  Saddle pulmonary embolism: He is on Xarelto 20 mg.  3.  Pulmonary hypertension: This is due to morbid obesity and obstructive sleep apnea.  He needs significant weight loss.  He uses CPAP.  4.  Hypertension: Does not own a BP cuff. No changes.  5.  Morbid obesity: Needs significant weight loss.  6.  Obstructive sleep apnea: Moderate to severe sleep apnea noted.  He uses CPAP.    COVID-19 Education: The signs and symptoms of COVID-19 were discussed with the patient and how to seek care for testing (follow up with PCP or arrange E-visit).  The importance of social distancing was discussed today.  Time:   Today, I have spent 10 minutes with the patient with telehealth technology discussing the above problems.     Medication Adjustments/Labs and Tests Ordered: Current medicines are reviewed at length with the patient today.  Concerns regarding medicines are outlined above.   Tests Ordered: No orders of the defined types were placed in this encounter.   Medication Changes: No orders of the defined types were placed in this encounter.   Follow Up:  Virtual Visit or In Person in 1 year(s)  Signed, Kate Sable, MD  10/11/2018 10:24 AM    [place]

## 2018-10-14 ENCOUNTER — Other Ambulatory Visit: Payer: Self-pay | Admitting: Physician Assistant

## 2018-10-14 DIAGNOSIS — I10 Essential (primary) hypertension: Secondary | ICD-10-CM

## 2018-10-15 ENCOUNTER — Telehealth: Payer: Self-pay

## 2018-10-15 NOTE — Telephone Encounter (Signed)
We have attempted to call the patient two times to schedule sleep study.  Patient has been unavailable at the phone numbers we have on file and has not returned our calls. If patient calls back we will schedule them for their sleep study.  

## 2018-10-16 NOTE — Progress Notes (Signed)
Referring Provider: Terald Sleeper, PA-C Primary Care Physician:  Terald Sleeper, PA-C  Chief Complaint  Patient presents with  . h/o polyps    HPI:   Bradley Torres is a 58 y.o. male presenting today for 4 month follow-up to schedule repeat colonoscopy. Last colonoscopy in March 2019 with inadequate prep. Patient had multiple polyps removed and diverticulosis throughout entire examined colon. Recommended to repeat in 6 months. Patient was last seen in November 2019. At that time patient had been admitted to hospital for NSTEMI in October 2019. Cardiac catherization without significant coronary disease. He was being worked up for sleep apnea. Dr. [name] recommended he complete the sleep apnea workup and return to the office in 4 months.   In the interim, patient was admitted to the hospital in December 2019 with right lower extremity DVT and saddle pulmonary embolism. Echo with stable right sided heart failure and chronic pulmonary hypertension. Patient discharged on Xarelto. Possible CVA at the time of PE as patient developed left hand and arm weakness and slight left sided facial droop. Patient is following with neurology. MRI of Brain March 2020. Per Dr. 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 frontal area. Previous stroke or trauma can be a cause- this is remote, not acute. 2-3 month minimum time elapsed." Patient hasn't been seen in office since.   Completed initial sleep study revealing severe sleep apnea. Patient has not had follow-up sleep study for proper titration of CPAP yet.   Today he states since December, he continues to have weakness of his left hand. He doesn't feel like his respiratory status is back to baseline since his PE, but definitely improving. Has minimal SOB at rest. Will get very short of breath with walking. Also with lightheadedness and dizziness when moving around, but this is improving. No chest pain. Occasional  palpitations, but improving. Taking Xarelto daily. No bleeding or bruising. Patient states Dr. Melvyn Novas wanted him to complete ECHO, but with COVID-19 it was canceled and he hasn't heard anything else about it.   Occasional lower left sided abdominal pain when his stools start to get firm. BMs daily. When BMs start to get firm, patient will eat Rasin bran which will get his bowel back to normal. No diarrhea. No blood in the stool. No black stools. No reflux symptoms, nausea, or vomiting. Occasional burning in abdomen. May go weeks without any burning. Spicy foods will cause this. Will drink a glass of milk and burning will stop. Patient prefers not to take medications for this. Wants to keep drinking milk. Dysphagia only with the first bite of food several times a week. Will swallow and feels like it doesn't want to go down this throat, drinks something, and has no other trouble after that. States he has a "lump" in his throat and "hacking cough" since starting lisinopril and feels like this is why he has the trouble swallowing at times. States Dr. Melvyn Novas is aware and felt like it was likely related to the medication, but no medication changes were made, and patient hasn't followed up since. Patient has upcoming appointment with his PCP.   Patient has had some intentional weight loss. States he has been trying to eat healthier and has cut back on the sweets. Appetite is still good.   Denies fever, chills.   Past Medical History:  Diagnosis Date  . CAD (coronary artery disease)    a. 12/2017: cath showing 10% Proximal-LAD stenosis with no  significant obstructive disease and LVEDP mildly elevated at 18 mm Hg.   Marland Kitchen Chronic back pain   . DVT (deep venous thrombosis) ([place])   . Essential hypertension   . Morbid obesity ([place])   . Panic disorder   . Prediabetes   . Shoulder pain, left    Following fall    Past Surgical History:  Procedure Laterality Date  . Ankle fracture sugery Left   . COLONOSCOPY WITH  PROPOFOL N/A 05/29/2017   Procedure: COLONOSCOPY WITH PROPOFOL;  Surgeon: Daneil Dolin, MD;  [facility];  Service: Endoscopy;  Laterality: N/A;  2:00pm  . LEFT HEART CATH AND CORONARY ANGIOGRAPHY N/A 01/23/2018   Procedure: LEFT HEART CATH AND CORONARY ANGIOGRAPHY;  Surgeon: [place] Sine, MD;  Location: [facility];  Service: Cardiovascular;  Laterality: N/A;  . LUMBAR SPINE SURGERY     fusion  . POLYPECTOMY  05/29/2017   Procedure: POLYPECTOMY;  Surgeon: Daneil Dolin, MD;  Location: [facility];  Service: Endoscopy;;  ascending colon polyp x5-cs transverse colon polyp x4- cs descending colon polyp x1- hs sigmoid colon polyp x1 -hs rectal polyp x1- hs  . TONSILLECTOMY      Current Outpatient Medications  Medication Sig Dispense Refill  . cyclobenzaprine (FLEXERIL) 10 MG tablet TAKE 1 TABLET BY MOUTH THREE TIMES DAILY AS NEEDED 90 tablet 0  . lisinopril-hydrochlorothiazide (ZESTORETIC) 20-25 MG tablet Take 1 tablet by mouth daily. 90 tablet 3  . metoprolol succinate (TOPROL-XL) 50 MG 24 hr tablet Take 1 tablet (50 mg total) by mouth daily. Take with or immediately following a meal. 90 tablet 3  . oxyCODONE-acetaminophen (PERCOCET) 10-325 MG tablet Take 1 tablet by mouth every 8 (eight) hours as needed for pain. 90 tablet 0  . oxyCODONE-acetaminophen (PERCOCET) 10-325 MG tablet Take 1 tablet by mouth every 8 (eight) hours as needed for pain. 90 tablet 0  . oxyCODONE-acetaminophen (PERCOCET) 10-325 MG tablet Take 1 tablet by mouth every 8 (eight) hours as needed for pain. 90 tablet 0  . rivaroxaban (XARELTO) 20 MG TABS tablet TAKE 1 TABLET BY MOUTH DAILY WITH SUPPER 30 tablet 11  . terbinafine (LAMISIL) 1 % cream Apply 1 application topically 2 (two) times daily as needed (FOR ITCHY SKIN.). 30 g 5  . bisoprolol-hydrochlorothiazide (ZIAC) 10-6.25 MG tablet TAKE 1 TABLET BY MOUTH EVERY DAY (Patient not taking: Reported on 10/17/2018) 90 tablet 1   No current  facility-administered medications for this visit.     Allergies as of 10/17/2018 - Review Complete 10/17/2018  Allergen Reaction Noted  . Ibuprofen Other (See Comments) 11/20/2013  . Diflucan [fluconazole] Rash 01/19/2018  . Losartan Rash 03/17/2016    Family History  Problem Relation Age of Onset  . Colon cancer Mother        died at age 104  . Heart attack Father   . Hepatitis Sister 42       Autoimmune  . Heart attack Paternal Grandfather     Social History   Socioeconomic History  . Marital status: Single    Spouse name: Not on file  . Number of children: Not on file  . Years of education: Not on file  . Highest education level: Not on file  Occupational History  . Not on file  Social Needs  . Financial resource strain: Not on file  . Food insecurity    Worry: Not on file    Inability: Not on file  . Transportation needs  Medical: Not on file    Non-medical: Not on file  Tobacco Use  . Smoking status: Never Smoker  . Smokeless tobacco: Never Used  Substance and Sexual Activity  . Alcohol use: Yes    Comment: rare.3  . Drug use: Yes    Types: Marijuana    Comment: occassional  . Sexual activity: Not Currently    Birth control/protection: None  Lifestyle  . Physical activity    Days per week: Not on file    Minutes per session: Not on file  . Stress: Not on file  Relationships  . Social Herbalist on phone: Not on file    Gets together: Not on file    Attends religious service: Not on file    Active member of club or organization: Not on file    Attends meetings of clubs or organizations: Not on file    Relationship status: Not on file  Other Topics Concern  . Not on file  Social History Narrative  . Not on file    Review of Systems: Gen: See HPI CV: Denies peripheral edema since starting Zestoretic  Resp: See HPI GI: See HPI Derm: Denies rash, itching, dry skin Psych: Denies depression, anxiety Heme: See HPI  Physical Exam:  BP (!) 150/91   Pulse 82   Temp (!) 97 F (36.1 C) (Oral)   Ht 6' (1.829 m)   Wt (!) 385 lb 9.6 oz (174.9 kg)   BMI 52.30 kg/m  General: Alert and oriented. No distress noted. Pleasant and cooperative.  Head:  Normocephalic and atraumatic. Eyes:  Conjuctiva clear without scleral icterus. Heart:  S1, S2 present without murmurs appreciated. Lungs:  Clear to auscultation bilaterally. No wheezes, rales, or rhonchi. No distress.  Abdomen:  +BS, soft, non-tender and non-distended. No rebound or guarding. No HSM or masses noted. Msk: Symmetrical without gross deformities. Normal posture. Extremities:  Without edema. Neurologic:  Alert and  oriented x4 Psych:  Alert and cooperative. Normal mood and affect.

## 2018-10-17 ENCOUNTER — Other Ambulatory Visit: Payer: Medicaid Other

## 2018-10-17 ENCOUNTER — Ambulatory Visit (INDEPENDENT_AMBULATORY_CARE_PROVIDER_SITE_OTHER): Payer: Medicaid Other | Admitting: Gastroenterology

## 2018-10-17 ENCOUNTER — Encounter: Payer: Self-pay | Admitting: Gastroenterology

## 2018-10-17 ENCOUNTER — Other Ambulatory Visit: Payer: Self-pay

## 2018-10-17 VITALS — BP 150/91 | HR 82 | Temp 97.0°F | Ht 72.0 in | Wt 385.6 lb

## 2018-10-17 DIAGNOSIS — I10 Essential (primary) hypertension: Secondary | ICD-10-CM | POA: Diagnosis not present

## 2018-10-17 DIAGNOSIS — Z7901 Long term (current) use of anticoagulants: Secondary | ICD-10-CM | POA: Diagnosis not present

## 2018-10-17 DIAGNOSIS — Z86010 Personal history of colonic polyps: Secondary | ICD-10-CM

## 2018-10-17 DIAGNOSIS — R1310 Dysphagia, unspecified: Secondary | ICD-10-CM | POA: Diagnosis not present

## 2018-10-17 DIAGNOSIS — R1013 Epigastric pain: Secondary | ICD-10-CM | POA: Diagnosis not present

## 2018-10-17 NOTE — Patient Instructions (Addendum)
[facility]- [phone]: Please call about sleep study.  Follow up with Dr. Morrison Old office [phone]. Please call his office to inquire of the ECHO they scheduled in April and when they will be seeing you in follow-up. You should also discuss with him the lump in your throat you have had since starting lisinopril.   Please start taking a daily fiber supplement. Benefiber or metamucil is a good option. Also ensure you are drinking plenty of water to keep your urine light yellow to clear.   Follow-up in 4-6 months to schedule colonoscopy.   Aliene Altes, PA-C [facility]

## 2018-10-17 NOTE — Assessment & Plan Note (Addendum)
58 y.o. male with history of multiple adenomatous polyps. Last colonoscopy March 2019 with inadequate prep; however, multiple polyps were removed from the examined colon and diverticulosis was present throughout. It was recommended patient repeat in 6 months. Patients mother passed from colon cancer at age 28. From a GI perspective, patient is doing fairly well. BMs daily. Minimal LLQ discomfort if bowels get firm. Resolves with Rasin Bran. No hematochezia or melena. Intermittent upper abdominal burning, triggered by spicy foods. Dysphagia with first bite of meals several times a week. Notes lump in his throat and cough since starting lisinopril and feels this is causing the dysphagia. Has spoken to Dr. Melvyn Novas about this in the past who felt it was his blood pressure medication. No other alarm features. Patient has been seen twice in the office now to schedule his coloscopy; however, he has had other health issues that are needing to be optimized prior to the procedure. He has not completed the second sleep study for CPAP titration. 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 Continues to have some degree of SOB at rest. Very short of breath with walking. Dizziness with walking. Patient was scheduled for ECHO in April that hasn't been completed. Continues on Xarelto without bleeding or bruising.    Considering patient ongoing health issues that are not yet adequately managed, we will see patient back in 4-6 months to schedule his TCS as he has no alarm features at this time. May also discuss possible EGD at that time. Discussed with patient this visit, but he is convinced that dysphagia is secondary to his blood pressure medication and isn't interested in EGD. Also declined medication management of his intermittent epigastric burning.  Add daily fiber supplement. Benefiber or Metamucil.  Patient is to follow-up with neurology as they scheduled his sleep  study. Patient also to follow-up with Dr. Melvyn Novas on the ECHO that was ordered as well as the lump in his throat.  Patient should also discuss BP medication and sensation of lump in his throat with PCP at upcoming visit.   Follow-up in 4-6 months.

## 2018-10-18 LAB — CBC WITH DIFFERENTIAL/PLATELET
Basophils Absolute: 0.1 10*3/uL (ref 0.0–0.2)
Basos: 1 %
EOS (ABSOLUTE): 0.2 10*3/uL (ref 0.0–0.4)
Eos: 2 %
Hematocrit: 42.9 % (ref 37.5–51.0)
Hemoglobin: 14.7 g/dL (ref 13.0–17.7)
Immature Grans (Abs): 0.1 10*3/uL (ref 0.0–0.1)
Immature Granulocytes: 1 %
Lymphocytes Absolute: 1 10*3/uL (ref 0.7–3.1)
Lymphs: 11 %
MCH: 33 pg (ref 26.6–33.0)
MCHC: 34.3 g/dL (ref 31.5–35.7)
MCV: 96 fL (ref 79–97)
Monocytes Absolute: 0.8 10*3/uL (ref 0.1–0.9)
Monocytes: 9 %
Neutrophils Absolute: 6.9 10*3/uL (ref 1.4–7.0)
Neutrophils: 76 %
Platelets: 309 10*3/uL (ref 150–450)
RBC: 4.46 x10E6/uL (ref 4.14–5.80)
RDW: 12.2 % (ref 11.6–15.4)
WBC: 9 10*3/uL (ref 3.4–10.8)

## 2018-10-18 LAB — CMP14+EGFR
ALT: 23 IU/L (ref 0–44)
AST: 20 IU/L (ref 0–40)
Albumin/Globulin Ratio: 1.4 (ref 1.2–2.2)
Albumin: 4.2 g/dL (ref 3.8–4.9)
Alkaline Phosphatase: 73 IU/L (ref 39–117)
BUN/Creatinine Ratio: 11 (ref 9–20)
BUN: 16 mg/dL (ref 6–24)
Bilirubin Total: 0.5 mg/dL (ref 0.0–1.2)
CO2: 25 mmol/L (ref 20–29)
Calcium: 9.6 mg/dL (ref 8.7–10.2)
Chloride: 98 mmol/L (ref 96–106)
Creatinine, Ser: 1.41 mg/dL — ABNORMAL HIGH (ref 0.76–1.27)
GFR calc Af Amer: 64 mL/min/{1.73_m2} (ref >59)
GFR calc non Af Amer: 55 mL/min/{1.73_m2} — ABNORMAL LOW (ref >59)
Globulin, Total: 3 g/dL (ref 1.5–4.5)
Glucose: 131 mg/dL — ABNORMAL HIGH (ref 65–99)
Potassium: 4.5 mmol/L (ref 3.5–5.2)
Sodium: 140 mmol/L (ref 134–144)
Total Protein: 7.2 g/dL (ref 6.0–8.5)

## 2018-10-18 LAB — LIPID PANEL
Chol/HDL Ratio: 5 ratio (ref 0.0–5.0)
Cholesterol, Total: 184 mg/dL (ref 100–199)
HDL: 37 mg/dL — ABNORMAL LOW (ref >39)
LDL Calculated: 120 mg/dL — ABNORMAL HIGH (ref 0–99)
Triglycerides: 136 mg/dL (ref 0–149)
VLDL Cholesterol Cal: 27 mg/dL (ref 5–40)

## 2018-10-18 LAB — TSH: TSH: 2.31 u[IU]/mL (ref 0.450–4.500)

## 2018-10-22 NOTE — Progress Notes (Signed)
cc'd to pcp 

## 2018-10-30 ENCOUNTER — Telehealth: Payer: Self-pay

## 2018-10-30 NOTE — Telephone Encounter (Signed)
Pt has called sleep lab stating he is ready to now schedule CPAP study. Informed pt the new policy of having to have a covid test 3 days prior to CPAP study and needing to be quarantined. Pt was agitated quickly by this and yelled out that he doesn't have time for this. Pt stated it was stupid. Tried to explain the necessity of this new policy.Pt was told to call back when he had time or was ready to schedule at a time that worked for him.

## 2018-12-11 ENCOUNTER — Other Ambulatory Visit: Payer: Self-pay | Admitting: Physician Assistant

## 2018-12-11 DIAGNOSIS — M170 Bilateral primary osteoarthritis of knee: Secondary | ICD-10-CM

## 2018-12-11 DIAGNOSIS — M75122 Complete rotator cuff tear or rupture of left shoulder, not specified as traumatic: Secondary | ICD-10-CM

## 2018-12-11 DIAGNOSIS — G894 Chronic pain syndrome: Secondary | ICD-10-CM

## 2018-12-12 ENCOUNTER — Telehealth: Payer: Self-pay | Admitting: Physician Assistant

## 2018-12-12 ENCOUNTER — Other Ambulatory Visit: Payer: Self-pay | Admitting: Physician Assistant

## 2018-12-12 NOTE — Telephone Encounter (Signed)
In looking at Rx's from 10/05/18, the Earliest fill date section all state the same thing of 10/05/18 not sure if this maybe the issue Please advise

## 2018-12-12 NOTE — Telephone Encounter (Signed)
I have reviewed the prescriptions and the 10/05/2018 date with [place] today were created the M.  However in a note to the pharmacist it does tell them to use 1 of them first, another 1 in 30 days, and another 1 in 60 days.  This is not something you can see on your prescription but it is in the pharmacist's note

## 2018-12-13 NOTE — Telephone Encounter (Signed)
Did they tell him that they did not have it?

## 2018-12-13 NOTE — Telephone Encounter (Signed)
Contacted [facility], Rx was on file, pt picked up yesterday evening

## 2019-01-04 ENCOUNTER — Other Ambulatory Visit: Payer: Self-pay

## 2019-01-07 ENCOUNTER — Ambulatory Visit (INDEPENDENT_AMBULATORY_CARE_PROVIDER_SITE_OTHER): Payer: Medicaid Other | Admitting: Physician Assistant

## 2019-01-07 ENCOUNTER — Encounter: Payer: Self-pay | Admitting: Physician Assistant

## 2019-01-07 ENCOUNTER — Other Ambulatory Visit: Payer: Self-pay

## 2019-01-07 VITALS — BP 139/85 | HR 64 | Temp 96.8°F | Ht 72.0 in | Wt 390.0 lb

## 2019-01-07 DIAGNOSIS — M75122 Complete rotator cuff tear or rupture of left shoulder, not specified as traumatic: Secondary | ICD-10-CM | POA: Diagnosis not present

## 2019-01-07 DIAGNOSIS — R7989 Other specified abnormal findings of blood chemistry: Secondary | ICD-10-CM

## 2019-01-07 DIAGNOSIS — E7841 Elevated Lipoprotein(a): Secondary | ICD-10-CM | POA: Diagnosis not present

## 2019-01-07 DIAGNOSIS — Z23 Encounter for immunization: Secondary | ICD-10-CM | POA: Diagnosis not present

## 2019-01-07 DIAGNOSIS — G894 Chronic pain syndrome: Secondary | ICD-10-CM | POA: Diagnosis not present

## 2019-01-07 DIAGNOSIS — M170 Bilateral primary osteoarthritis of knee: Secondary | ICD-10-CM | POA: Diagnosis not present

## 2019-01-07 MED ORDER — OXYCODONE-ACETAMINOPHEN 10-325 MG PO TABS: 1.0000 | ORAL_TABLET | Freq: Three times a day (TID) | ORAL | 0 refills | Status: DC | PRN

## 2019-01-07 MED ORDER — METOPROLOL SUCCINATE ER 50 MG PO TB24: 50.0000 mg | ORAL_TABLET | Freq: Every day | ORAL | 3 refills | Status: DC

## 2019-01-08 LAB — CMP14+EGFR
ALT: 26 IU/L (ref 0–44)
AST: 22 IU/L (ref 0–40)
Albumin/Globulin Ratio: 1.5 (ref 1.2–2.2)
Albumin: 4.2 g/dL (ref 3.8–4.9)
Alkaline Phosphatase: 73 IU/L (ref 39–117)
BUN/Creatinine Ratio: 12 (ref 9–20)
BUN: 18 mg/dL (ref 6–24)
Bilirubin Total: 0.4 mg/dL (ref 0.0–1.2)
CO2: 26 mmol/L (ref 20–29)
Calcium: 9.4 mg/dL (ref 8.7–10.2)
Chloride: 102 mmol/L (ref 96–106)
Creatinine, Ser: 1.48 mg/dL — ABNORMAL HIGH (ref 0.76–1.27)
GFR calc Af Amer: 60 mL/min/{1.73_m2} (ref >59)
GFR calc non Af Amer: 52 mL/min/{1.73_m2} — ABNORMAL LOW (ref >59)
Globulin, Total: 2.8 g/dL (ref 1.5–4.5)
Glucose: 168 mg/dL — ABNORMAL HIGH (ref 65–99)
Potassium: 3.7 mmol/L (ref 3.5–5.2)
Sodium: 141 mmol/L (ref 134–144)
Total Protein: 7 g/dL (ref 6.0–8.5)

## 2019-01-08 LAB — LIPID PANEL
Chol/HDL Ratio: 4.7 ratio (ref 0.0–5.0)
Cholesterol, Total: 183 mg/dL (ref 100–199)
HDL: 39 mg/dL — ABNORMAL LOW (ref >39)
LDL Chol Calc (NIH): 123 mg/dL — ABNORMAL HIGH (ref 0–99)
Triglycerides: 114 mg/dL (ref 0–149)
VLDL Cholesterol Cal: 21 mg/dL (ref 5–40)

## 2019-01-10 NOTE — Progress Notes (Signed)
BP 139/85   Pulse 64   Temp (!) 96.8 F (36 C) (Temporal)   Ht 6' (1.829 m)   Wt (!) 390 lb (176.9 kg)   SpO2 96%   BMI 52.89 kg/m    Subjective:    Patient ID: Bradley Torres, male    DOB: Aug 16, 1960, 58 y.o.   MRN: [id number]  HPI: Bradley Torres is a 58 y.o. male presenting on 01/07/2019 for Pain (3 month follow up ), Hypertension, and Medical Management of Chronic Issues  This patient comes in for recheck on his chronic medical conditions.  They do include chronic pain syndrome related to osteoarthritis, heart disease, hyperlipidemia.  He denies having any current issues at this time.  Overall he is doing well.  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 Moderately severe osteoarthritis with a small joint effusion.  This patient returns for a 3 month recheck on narcotic use for the above named conditions  Current medications-Percocet 10/325 1 every 8 hours as needed for severe pain Flexeril as needed for muscle spasm Medication side effects-no Any concerns-no  Pain on scale of 1-10-7 Frequency-Daily What increases pain-walking What makes pain Better-rest Effects on ADL -mild Any change in general medical condition-no  Effectiveness of current meds-good Adverse reactions form pain meds-no PMP AWARE website reviewed:Yes Any suspicious activity on PMP Aware:No MME daily dose:45  Contract on file10/02/2019 Last UDS2/11/20  History of overdose or risk of abuseno  Past Medical History:  Diagnosis Date  . CAD (coronary artery disease)    a. 12/2017: cath showing 10% Proximal-LAD stenosis with no significant obstructive disease and LVEDP mildly elevated at 18 mm Hg.   Marland Kitchen Chronic back pain   . DVT  (deep venous thrombosis) ([place])   . Essential hypertension   . Morbid obesity ([place])   . Panic disorder   . Prediabetes   . Shoulder pain, left    Following fall   Relevant past medical, surgical, family and social history reviewed and updated as indicated. Interim medical history since our last visit reviewed. Allergies and medications reviewed and updated. DATA REVIEWED: CHART IN EPIC  Family History reviewed for pertinent findings.  Review of Systems  Constitutional: Negative.  Negative for appetite change and fatigue.  Eyes: Negative for pain and visual disturbance.  Respiratory: Negative.  Negative for cough, chest tightness, shortness of breath and wheezing.   Cardiovascular: Negative.  Negative for chest pain, palpitations and leg swelling.  Gastrointestinal: Negative.  Negative for abdominal pain, diarrhea, nausea and vomiting.  Genitourinary: Negative.   Musculoskeletal: Positive for arthralgias, back pain, joint swelling and myalgias.  Skin: Negative.  Negative for color change and rash.  Neurological: Negative.  Negative for weakness, numbness and headaches.  Psychiatric/Behavioral: Negative.     Allergies as of 01/07/2019      Reactions   Ibuprofen Other (See Comments)   Rectal bleed   Diflucan [fluconazole] Rash   Losartan Rash      Medication List       Accurate as of January 07, 2019 11:59 PM. If you have any questions, ask your nurse or doctor.        cyclobenzaprine 10 MG tablet Commonly known as: FLEXERIL TAKE 1 TABLET BY MOUTH THREE TIMES DAILY AS NEEDED   lisinopril-hydrochlorothiazide 20-25  MG tablet Commonly known as: ZESTORETIC Take 1 tablet by mouth daily.   metoprolol succinate 50 MG 24 hr tablet Commonly known as: TOPROL-XL Take 1 tablet (50 mg total) by mouth daily. Take with or immediately following a meal.   oxyCODONE-acetaminophen 10-325 MG tablet Commonly known as: PERCOCET Take 1 tablet by mouth every 8 (eight) hours as needed for  pain.   oxyCODONE-acetaminophen 10-325 MG tablet Commonly known as: PERCOCET Take 1 tablet by mouth every 8 (eight) hours as needed for pain.   oxyCODONE-acetaminophen 10-325 MG tablet Commonly known as: PERCOCET Take 1 tablet by mouth every 8 (eight) hours as needed for pain.   rivaroxaban 20 MG Tabs tablet Commonly known as: Xarelto TAKE 1 TABLET BY MOUTH DAILY WITH SUPPER   terbinafine 1 % cream Commonly known as: LAMISIL Apply 1 application topically 2 (two) times daily as needed (FOR ITCHY SKIN.).          Objective:    BP 139/85   Pulse 64   Temp (!) 96.8 F (36 C) (Temporal)   Ht 6' (1.829 m)   Wt (!) 390 lb (176.9 kg)   SpO2 96%   BMI 52.89 kg/m   Allergies  Allergen Reactions  . Ibuprofen Other (See Comments)    Rectal bleed  . Diflucan [Fluconazole] Rash  . Losartan Rash    Wt Readings from Last 3 Encounters:  01/07/19 (!) 390 lb (176.9 kg)  10/17/18 (!) 385 lb 9.6 oz (174.9 kg)  10/11/18 (!) 400 lb (181.4 kg)    Physical Exam Vitals signs and nursing note reviewed.  Constitutional:      General: He is not in acute distress.    Appearance: He is well-developed.  HENT:     Head: Normocephalic and atraumatic.  Eyes:     Conjunctiva/sclera: Conjunctivae normal.     Pupils: Pupils are equal, round, and reactive to light.  Cardiovascular:     Rate and Rhythm: Normal rate and regular rhythm.     Heart sounds: Normal heart sounds.  Pulmonary:     Effort: Pulmonary effort is normal. No respiratory distress.     Breath sounds: Normal breath sounds.  Skin:    General: Skin is warm and dry.  Psychiatric:        Behavior: Behavior normal.     Results for orders placed or performed in visit on 01/07/19  CMP14+EGFR  Result Value Ref Range   Glucose 168 (H) 65 - 99 mg/dL   BUN 18 6 - 24 mg/dL   Creatinine, Ser 1.48 (H) 0.76 - 1.27 mg/dL   GFR calc non Af Amer 52 (L) >59 mL/min/1.73   GFR calc Af Amer 60 >59 mL/min/1.73   BUN/Creatinine Ratio 12  9 - 20   Sodium 141 134 - 144 mmol/L   Potassium 3.7 3.5 - 5.2 mmol/L   Chloride 102 96 - 106 mmol/L   CO2 26 20 - 29 mmol/L   Calcium 9.4 8.7 - 10.2 mg/dL   Total Protein 7.0 6.0 - 8.5 g/dL   Albumin 4.2 3.8 - 4.9 g/dL   Globulin, Total 2.8 1.5 - 4.5 g/dL   Albumin/Globulin Ratio 1.5 1.2 - 2.2   Bilirubin Total 0.4 0.0 - 1.2 mg/dL   Alkaline Phosphatase 73 39 - 117 IU/L   AST 22 0 - 40 IU/L   ALT 26 0 - 44 IU/L  Lipid panel  Result Value Ref Range   Cholesterol, Total 183 100 - 199 mg/dL   Triglycerides  114 0 - 149 mg/dL   HDL 39 (L) >39 mg/dL   VLDL Cholesterol Cal 21 5 - 40 mg/dL   LDL Chol Calc (NIH) 123 (H) 0 - 99 mg/dL   Chol/HDL Ratio 4.7 0.0 - 5.0 ratio      Assessment & Plan:   1. Primary osteoarthritis of both knees - oxyCODONE-acetaminophen (PERCOCET) 10-325 MG tablet; Take 1 tablet by mouth every 8 (eight) hours as needed for pain.  Dispense: 90 tablet; Refill: 0 - oxyCODONE-acetaminophen (PERCOCET) 10-325 MG tablet; Take 1 tablet by mouth every 8 (eight) hours as needed for pain.  Dispense: 90 tablet; Refill: 0 - oxyCODONE-acetaminophen (PERCOCET) 10-325 MG tablet; Take 1 tablet by mouth every 8 (eight) hours as needed for pain.  Dispense: 90 tablet; Refill: 0  2. Chronic pain syndrome - oxyCODONE-acetaminophen (PERCOCET) 10-325 MG tablet; Take 1 tablet by mouth every 8 (eight) hours as needed for pain.  Dispense: 90 tablet; Refill: 0 - oxyCODONE-acetaminophen (PERCOCET) 10-325 MG tablet; Take 1 tablet by mouth every 8 (eight) hours as needed for pain.  Dispense: 90 tablet; Refill: 0 - oxyCODONE-acetaminophen (PERCOCET) 10-325 MG tablet; Take 1 tablet by mouth every 8 (eight) hours as needed for pain.  Dispense: 90 tablet; Refill: 0  3. Nontraumatic complete tear of left rotator cuff - oxyCODONE-acetaminophen (PERCOCET) 10-325 MG tablet; Take 1 tablet by mouth every 8 (eight) hours as needed for pain.  Dispense: 90 tablet; Refill: 0 - oxyCODONE-acetaminophen  (PERCOCET) 10-325 MG tablet; Take 1 tablet by mouth every 8 (eight) hours as needed for pain.  Dispense: 90 tablet; Refill: 0 - oxyCODONE-acetaminophen (PERCOCET) 10-325 MG tablet; Take 1 tablet by mouth every 8 (eight) hours as needed for pain.  Dispense: 90 tablet; Refill: 0  4. Elevated serum creatinine - CMP14+EGFR  5. Elevated lipoprotein(a) - CMP14+EGFR - Lipid panel  6. Need for immunization against influenza - Flu Vaccine QUAD 36+ mos IM   Continue all other maintenance medications as listed above.  Follow up plan: Return in about 3 months (around 04/09/2019).  Educational handout given for Circle PA-C [facility] [address]  [place], [place] [address] [phone]   01/10/2019, 7:49 PM

## 2019-01-15 IMAGING — CT CT ABD-PELV W/ CM
2 of 5 series · 17 of 46 positions shown, 19 images · IV contrast (iopamidol)
Comparison: None.

CLINICAL DATA: Abdominal pain, unspecified.  Symptoms for 2 weeks.

EXAM:
CT ABDOMEN AND PELVIS WITH CONTRAST
TECHNIQUE: Multidetector CT imaging of the abdomen and pelvis was performed
using the standard protocol following bolus administration of
intravenous contrast.
CONTRAST:  100mL UW0IN4-NWW IOPAMIDOL (UW0IN4-NWW) INJECTION 61%

[Series 2: axial (person_name) (person_name) · axial · 0.98mm/px · z∈[+882,+1352]mm · 14 of 106 slices shown, 16 images]
[im 6/106  soft-tissue]
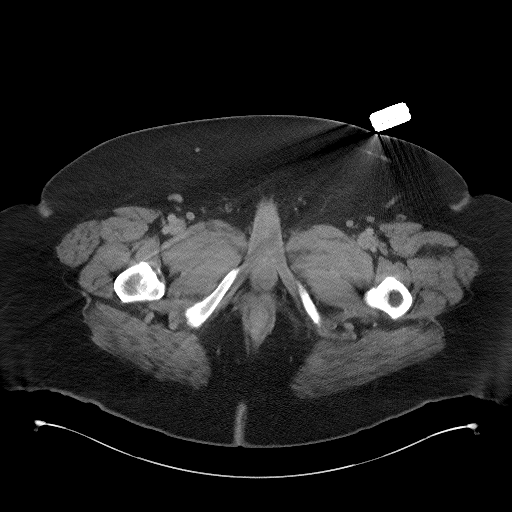
[im 6/106  bone]
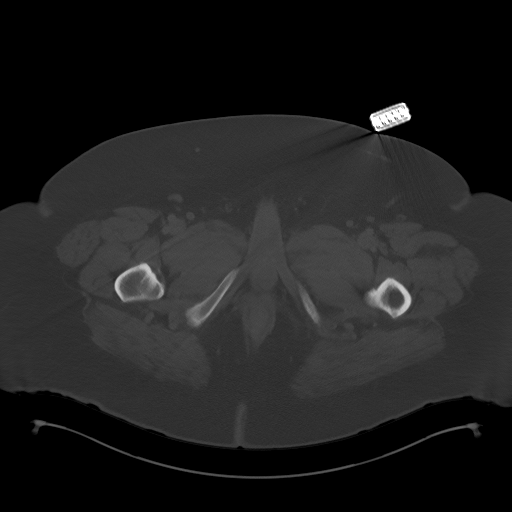
[im 12/106  soft-tissue]
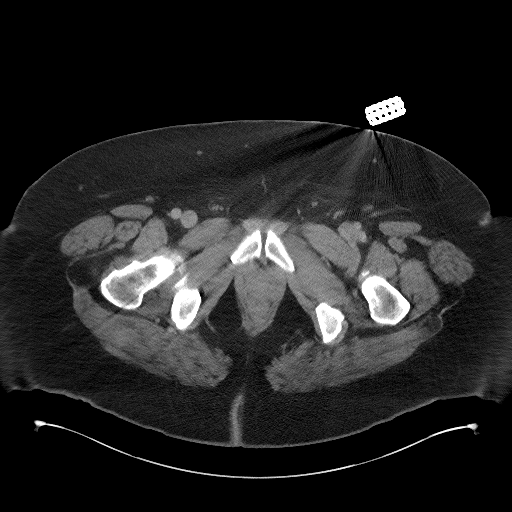
[im 23/106  soft-tissue]
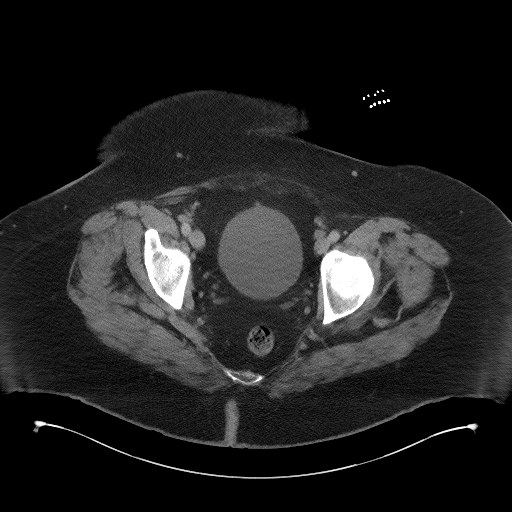
[im 28/106  soft-tissue]
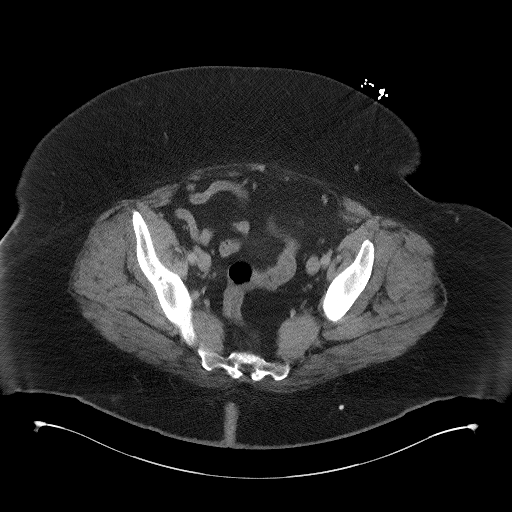
[im 34/106  soft-tissue]
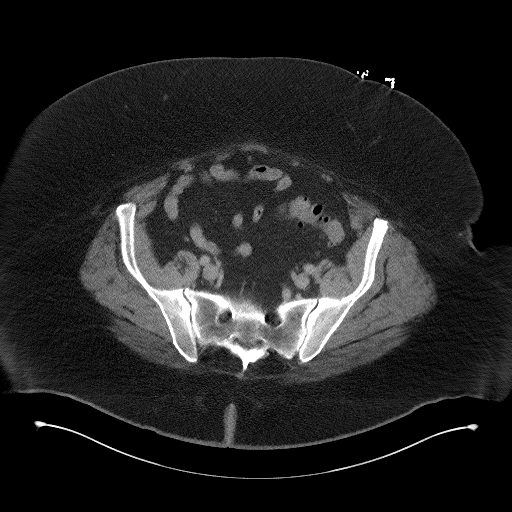
[im 45/106  soft-tissue]
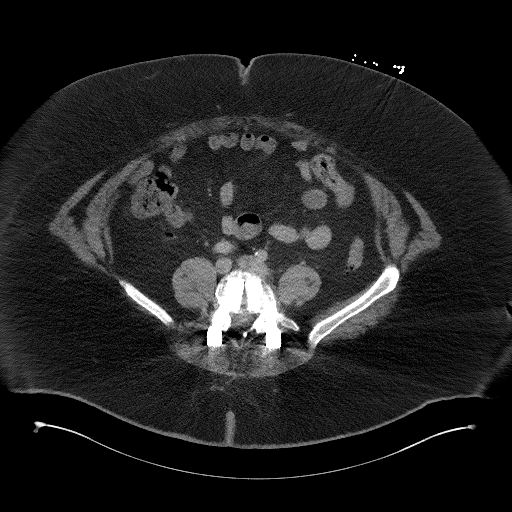
[im 50/106  soft-tissue]
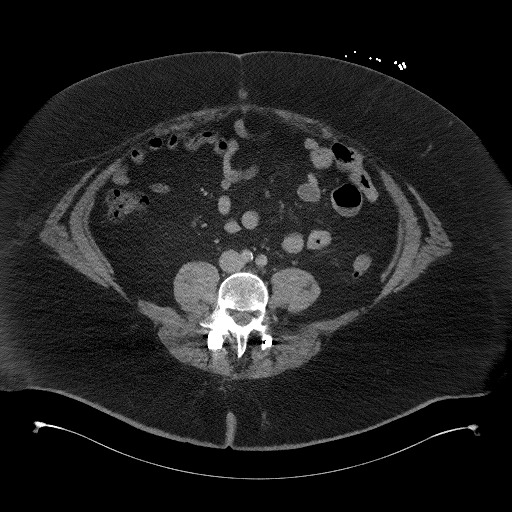
[im 56/106  soft-tissue]
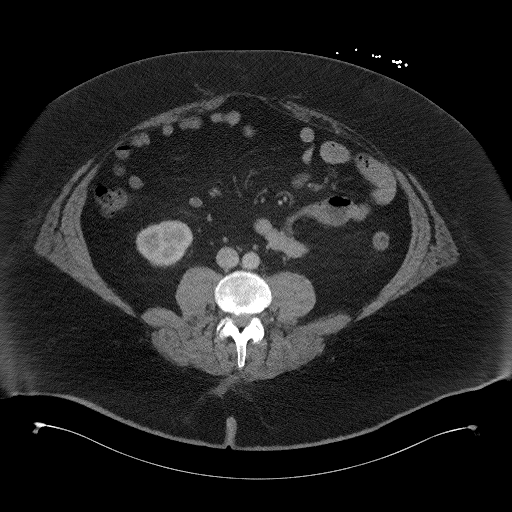
[im 61/106  soft-tissue]
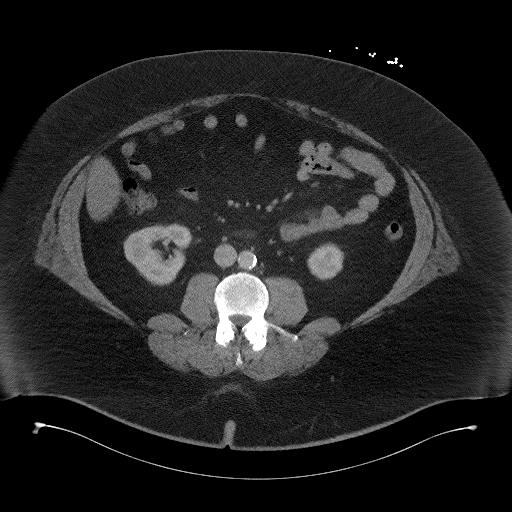
[im 61/106  bone]
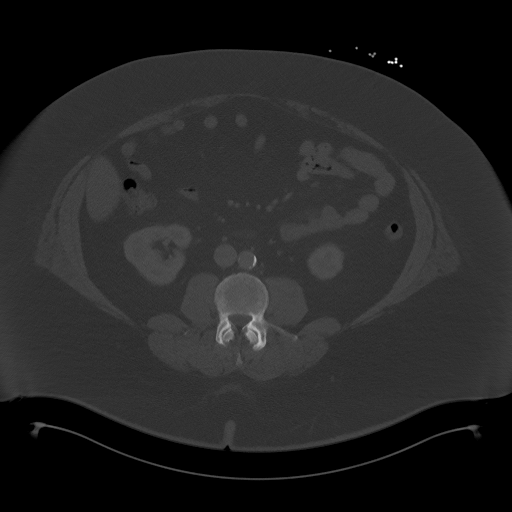
[im 72/106  soft-tissue]
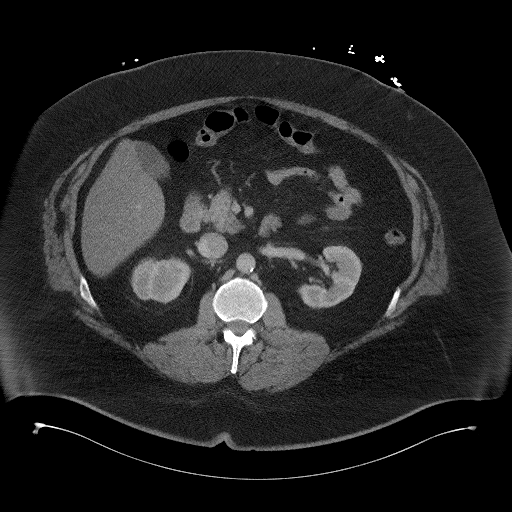
[im 78/106  soft-tissue]
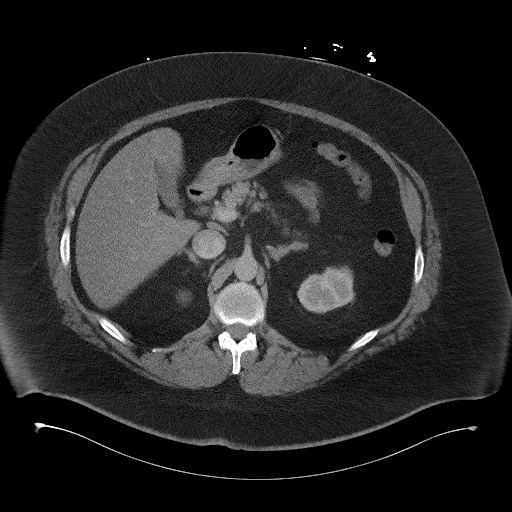
[im 83/106  soft-tissue]
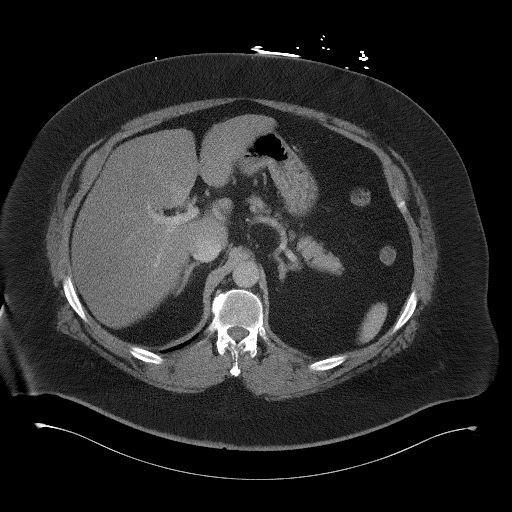
[im 94/106  soft-tissue]
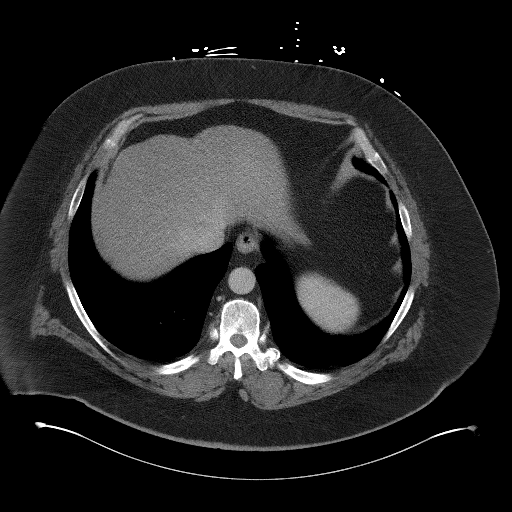
[im 100/106  soft-tissue]
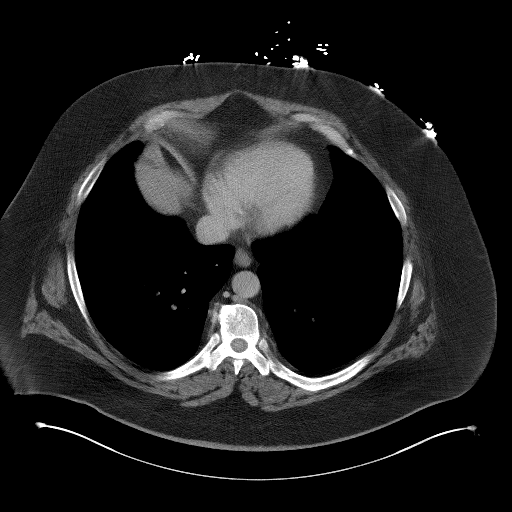

[Series 5: coronal st · coronal · 0.96mm/px · 3 of 117 slices shown]
[im 39/117  soft-tissue]
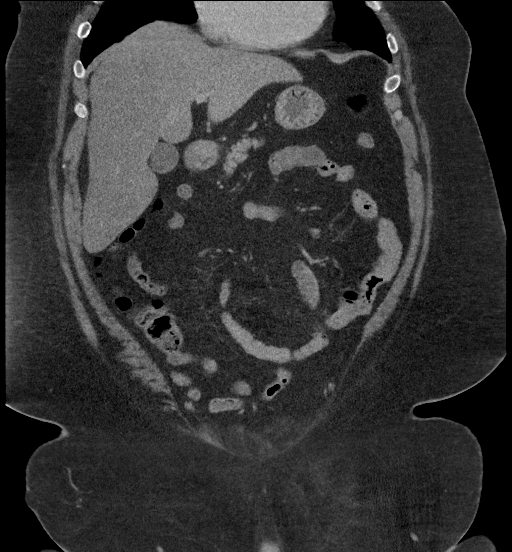
[im 52/117  soft-tissue]
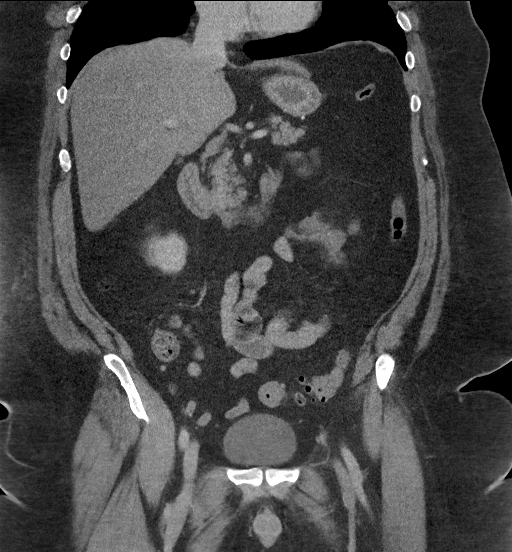
[im 65/117  soft-tissue]
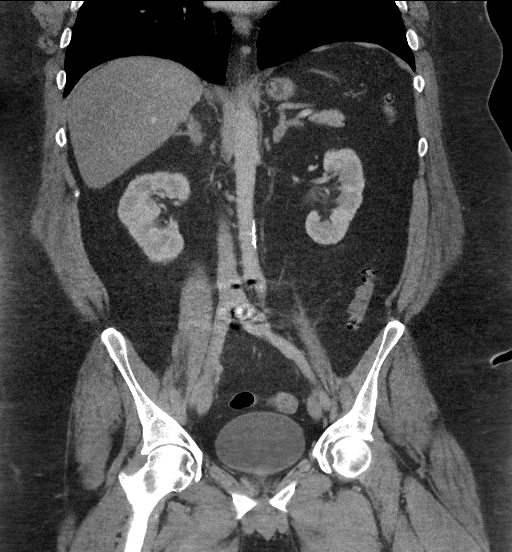

[17 of 46 positions shown; findings below may reference images not displayed]

FINDINGS: Lower chest:  No contributory findings.

Hepatobiliary: Hepatic steatosis.Layering gallstone. No evidence of
biliary obstruction or inflammation.

Pancreas: Unremarkable.

Spleen: Unremarkable.

Adrenals/Urinary Tract: Negative adrenals. 3.2 cm upper pole cyst on
the right. Symmetric normal renal enhancement otherwise. No stone or
hydronephrosis. Unremarkable bladder.

Stomach/Bowel: No obstruction. No appendicitis. Left colonic
diverticulosis.

Vascular/Lymphatic: No acute vascular abnormality. Atherosclerotic
calcification. No mass or adenopathy.

Reproductive:Prostate calcification.

Other: No ascites or pneumoperitoneum.

Musculoskeletal: No acute abnormalities. L4-5 PLIF with solid
arthrodesis.
IMPRESSION: 1. No acute finding.
2. Hepatic steatosis, cholelithiasis, and colonic diverticulosis.

## 2019-02-23 IMAGING — US RIGHT LOWER EXTREMITY VENOUS ULTRASOUND
1 series · 13 of 24 positions shown · non-contrast
Comparison: None.

CLINICAL DATA: 57-year-old with right calf pain and edema.



[Series 1: right lower extremity venous ultrasound · 0.10mm/px · 13 of 39 slices shown]
[im 1/39]
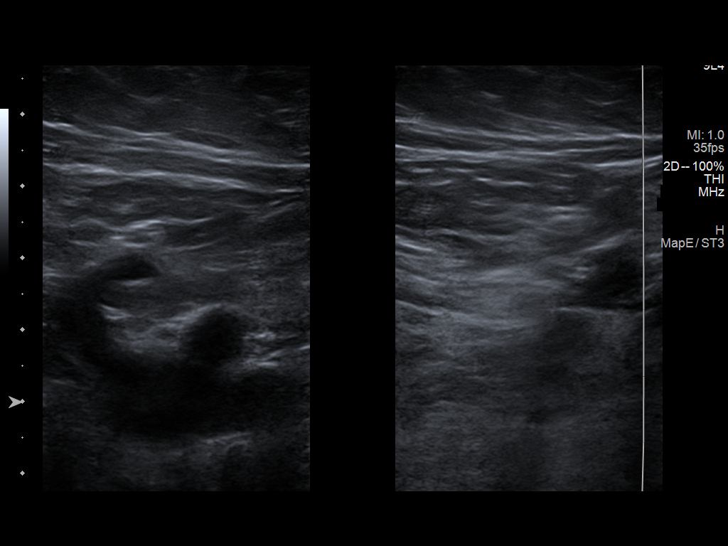
[im 4/39]
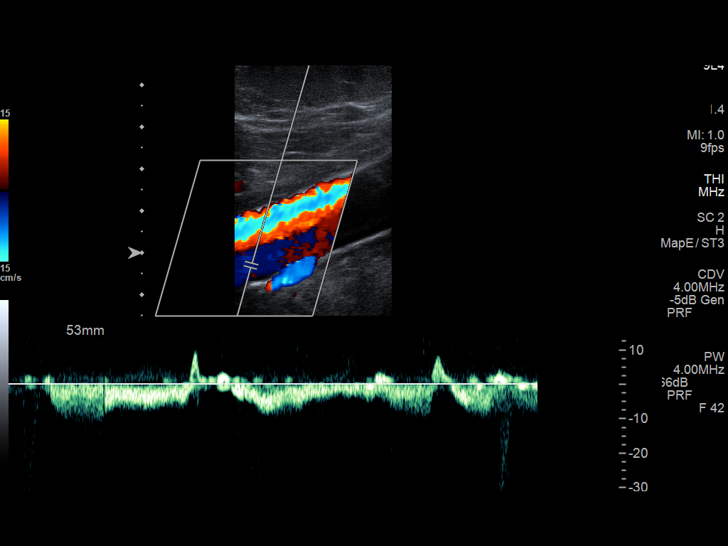
[im 7/39]
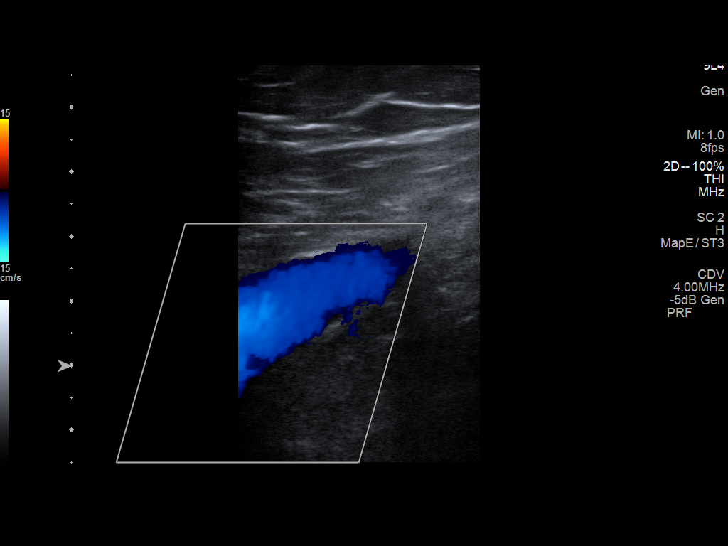
[im 10/39]
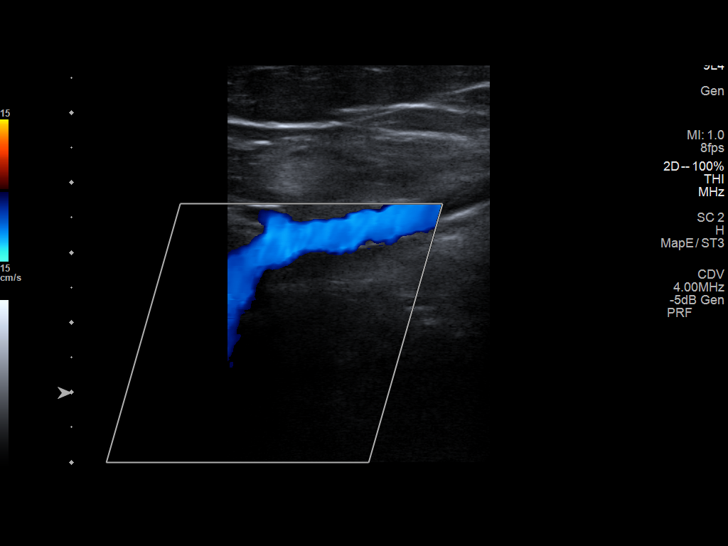
[im 14/39]
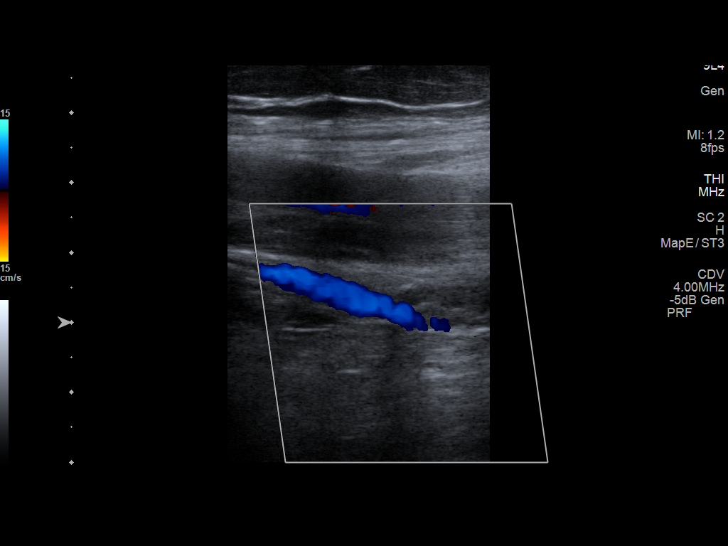
[im 17/39]
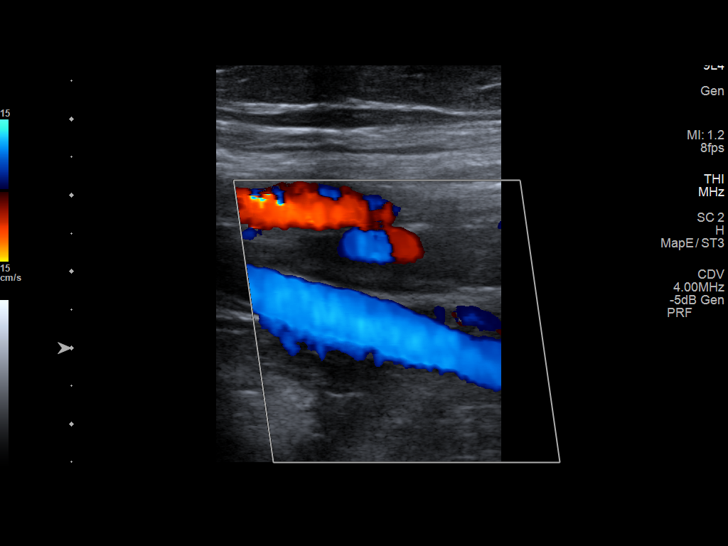
[im 20/39]
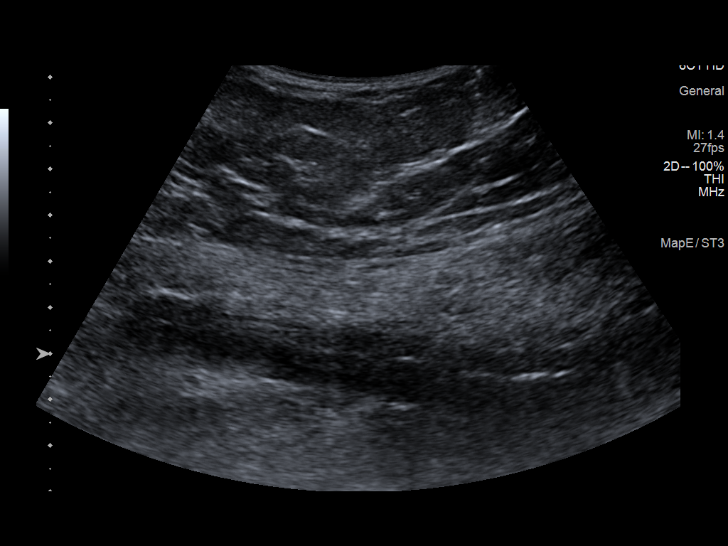
[im 22/39]
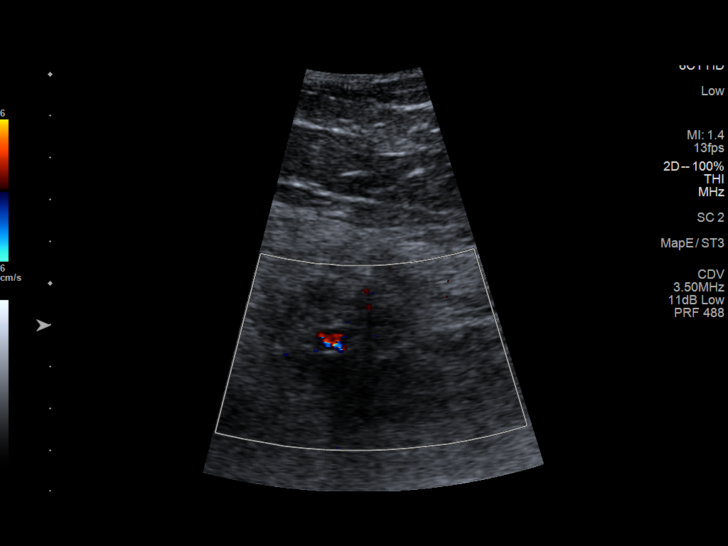
[im 25/39]
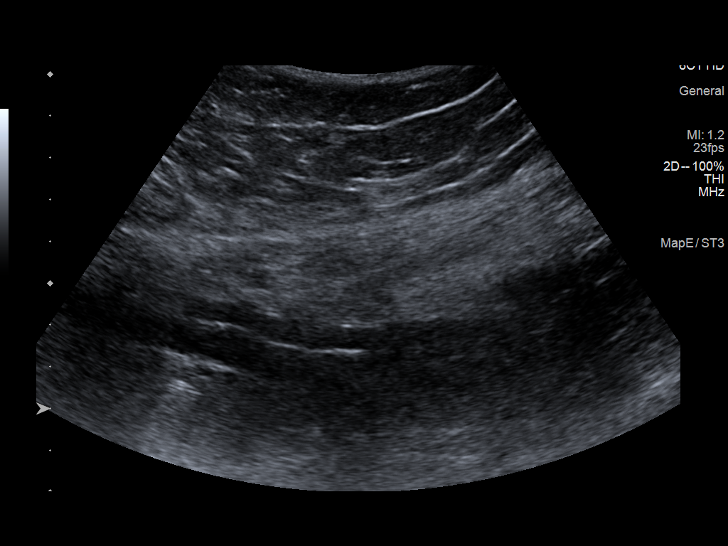
[im 29/39]
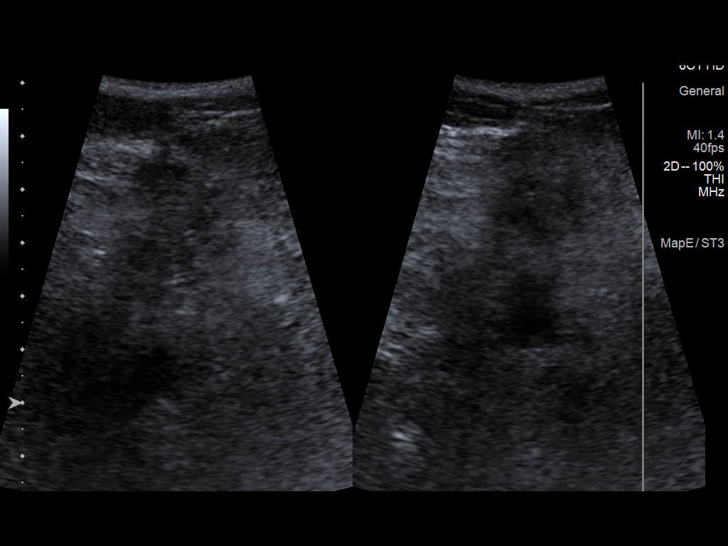
[im 32/39]
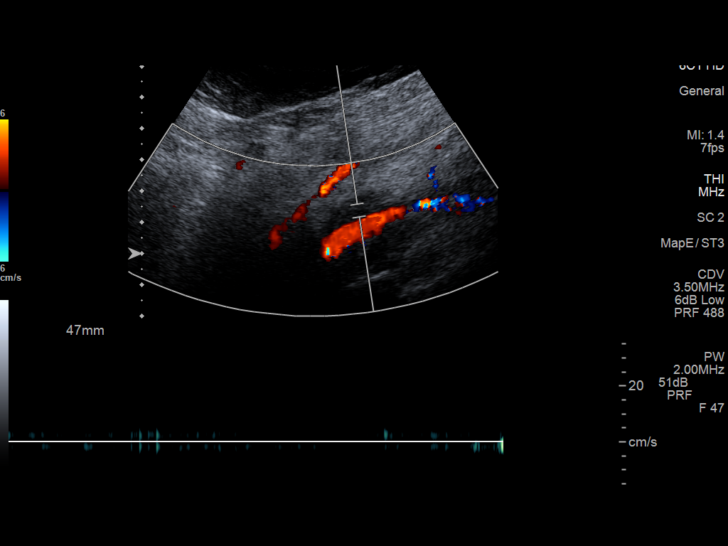
[im 35/39]
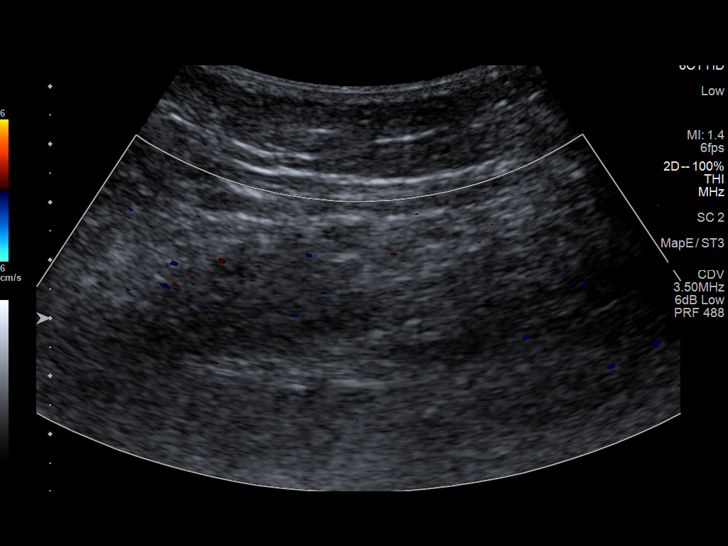
[im 39/39]
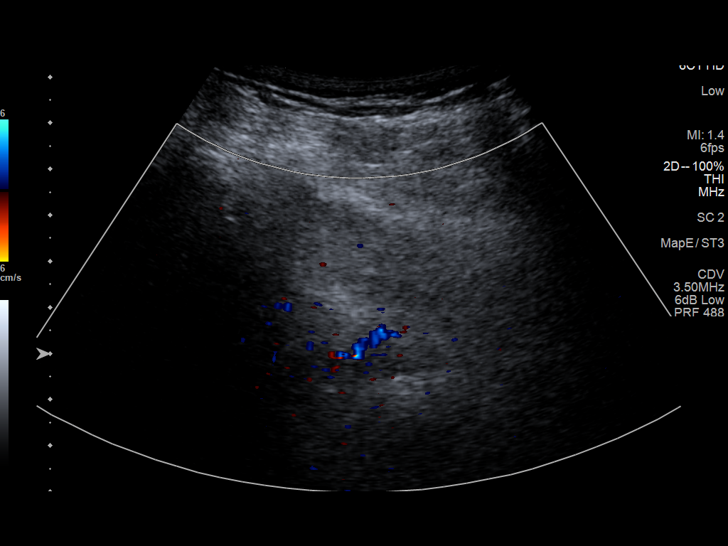

[13 of 24 positions shown; findings below may reference images not displayed]

FINDINGS: Contralateral Common Femoral Vein: Respiratory phasicity is normal
and symmetric with the symptomatic side. No evidence of thrombus.
Normal compressibility.

Common Femoral Vein: No evidence of thrombus. Normal
compressibility, respiratory phasicity and response to augmentation.

Saphenofemoral Junction: No evidence of thrombus. Normal
compressibility and flow on color Doppler imaging.

Profunda Femoral Vein: No evidence of thrombus. Normal
compressibility and flow on color Doppler imaging.

Femoral Vein: Positive for thrombus. Proximal, mid and distal
femoral vein is not compressible. There is echogenic thrombus
throughout the femoral vein.

Popliteal Vein: Positive for thrombus. Occlusive thrombus in the
popliteal vein.

Calf Veins: Limited evaluation of the deep calf veins but there is
concern for thrombus involving the posterior tibial veins and
peroneal veins.

Other Findings: Right great saphenous vein appears to be
compressible and patent.
IMPRESSION: Positive for deep venous thrombosis in the right lower extremity.
Thrombus involving the right femoral vein and right popliteal vein.
Probable thrombus in right deep calf veins.

## 2019-02-23 IMAGING — CT CT ANGIO CHEST
2 of 6 series · 18 of 46 positions shown · IV contrast (Isovue)
Comparison: 09/09/2012 chest CT report, CXR 01/19/2018

CLINICAL DATA: Chest pain with right leg DVT.

EXAM:
CT ANGIOGRAPHY CHEST WITH CONTRAST
TECHNIQUE: Multidetector CT imaging of the chest was performed using the
standard protocol during bolus administration of intravenous
contrast. Multiplanar CT image reconstructions and MIPs were
obtained to evaluate the vascular anatomy.
CONTRAST:  80mL WMSHMU-DCY IOPAMIDOL (WMSHMU-DCY) INJECTION 76%

[Series 7: thins · axial · 0.89mm/px · z∈[+1445,+1724]mm · 15 of 307 slices shown]
[im 14/307  lung]
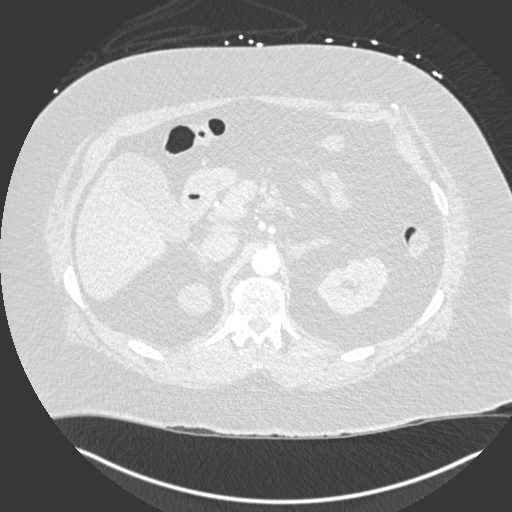
[im 40/307  soft-tissue]
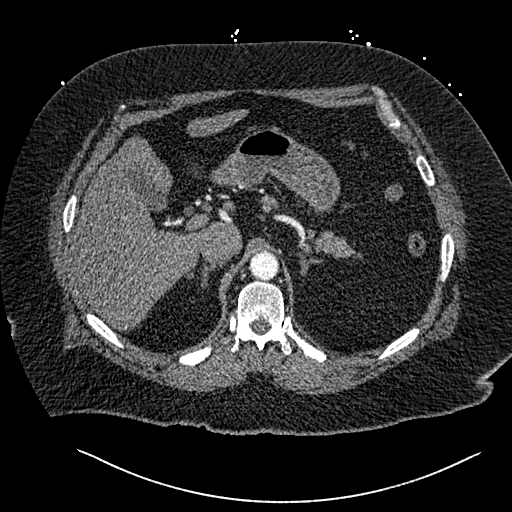
[im 54/307  lung]
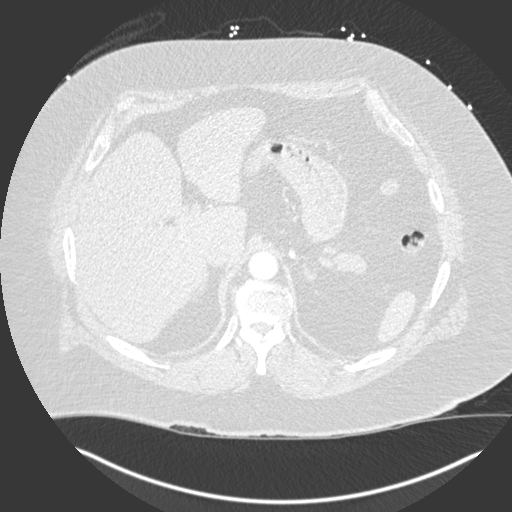
[im 80/307  soft-tissue]
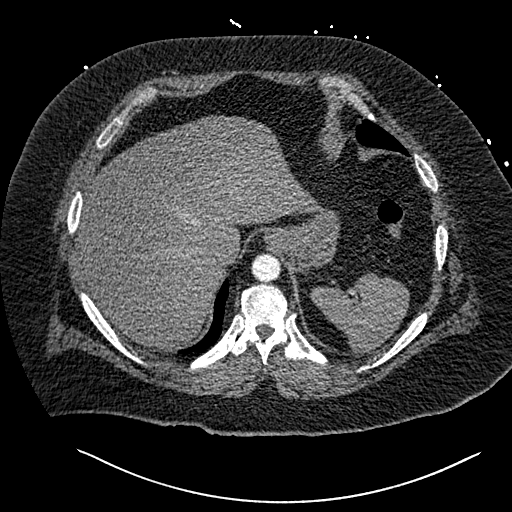
[im 94/307  lung]
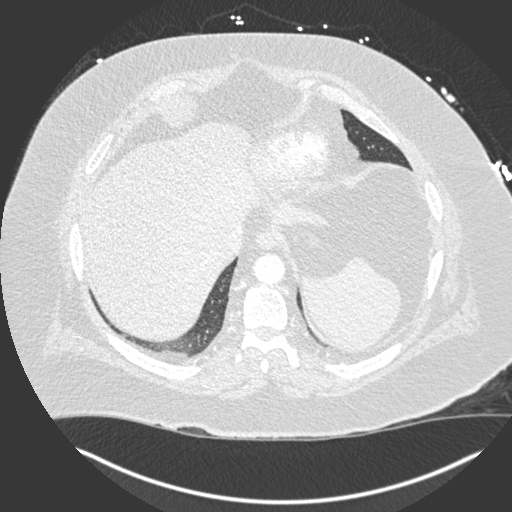
[im 120/307  soft-tissue]
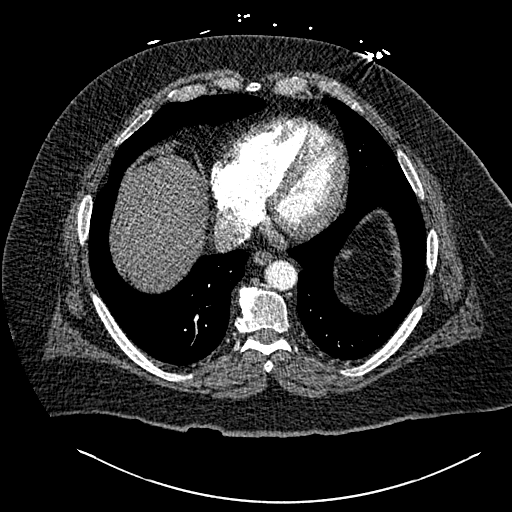
[im 134/307  lung]
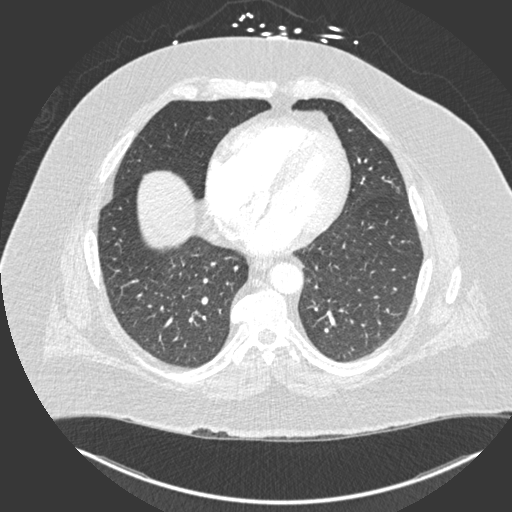
[im 160/307  soft-tissue]
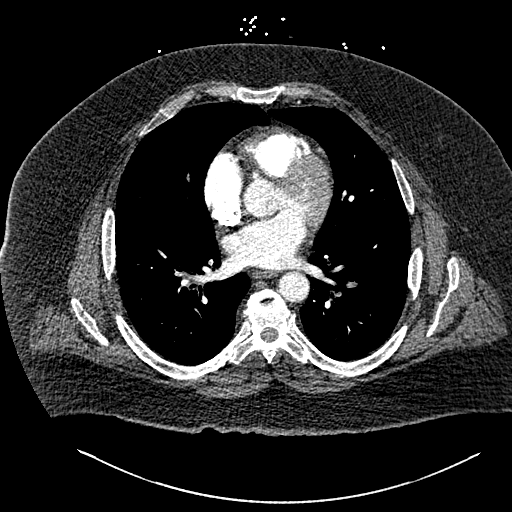
[im 173/307  lung]
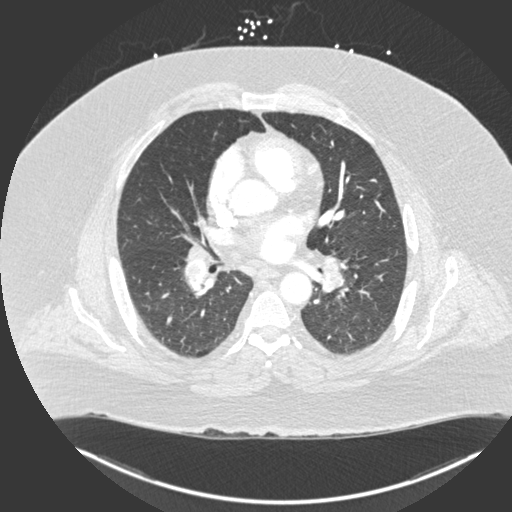
[im 187/307  soft-tissue]
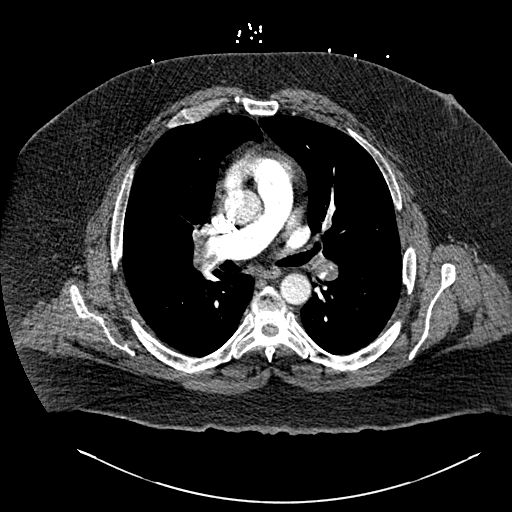
[im 213/307  lung]
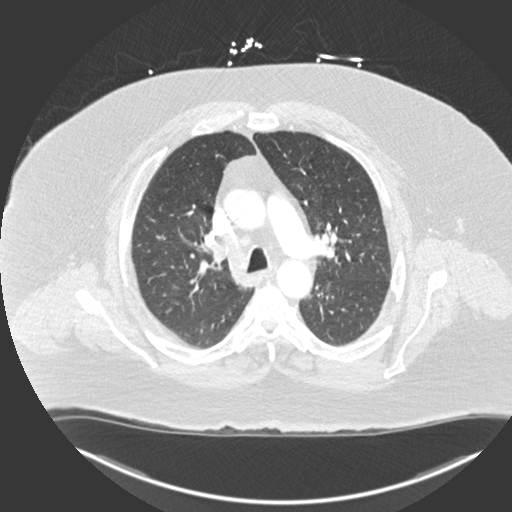
[im 227/307  soft-tissue]
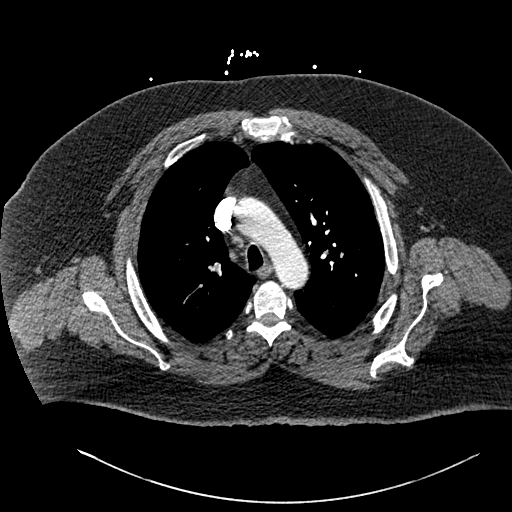
[im 253/307  lung]
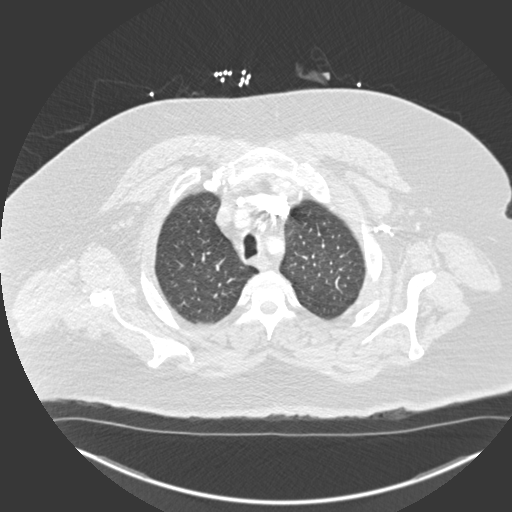
[im 267/307  soft-tissue]
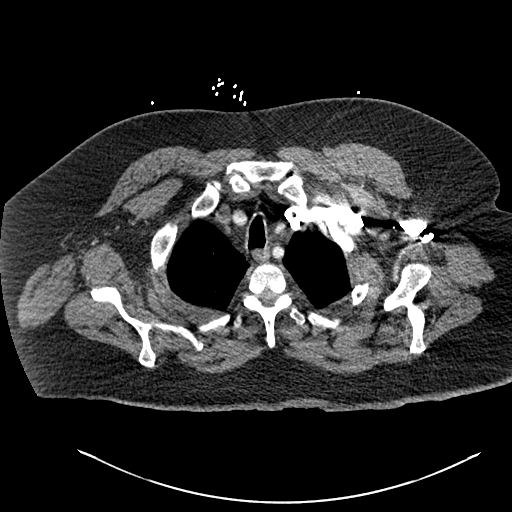
[im 293/307  lung]
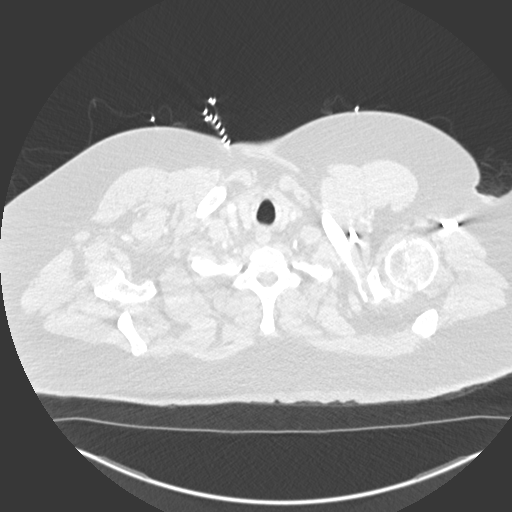

[Series 9: coronal mpr · coronal · 0.63mm/px · 3 of 184 slices shown]
[im 46/184  soft-tissue]
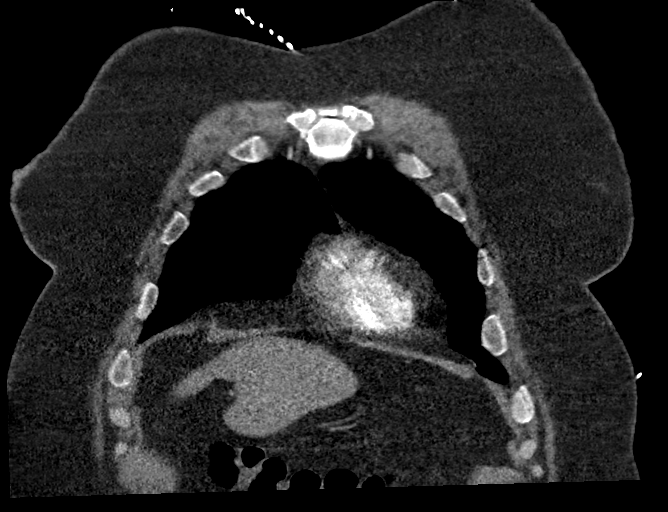
[im 92/184  soft-tissue]
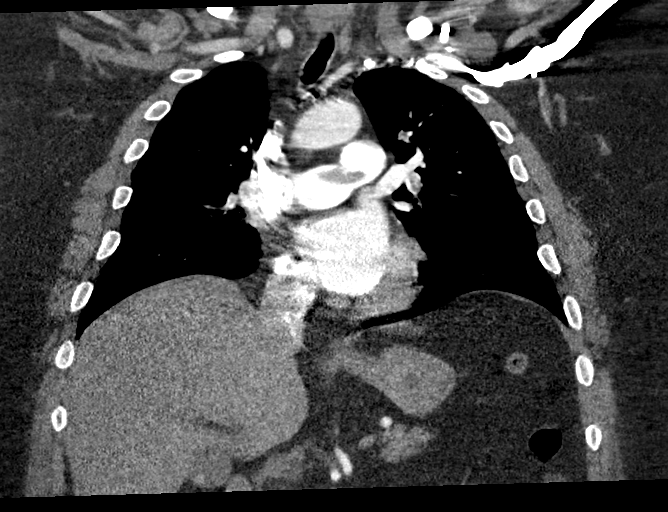
[im 138/184  soft-tissue]
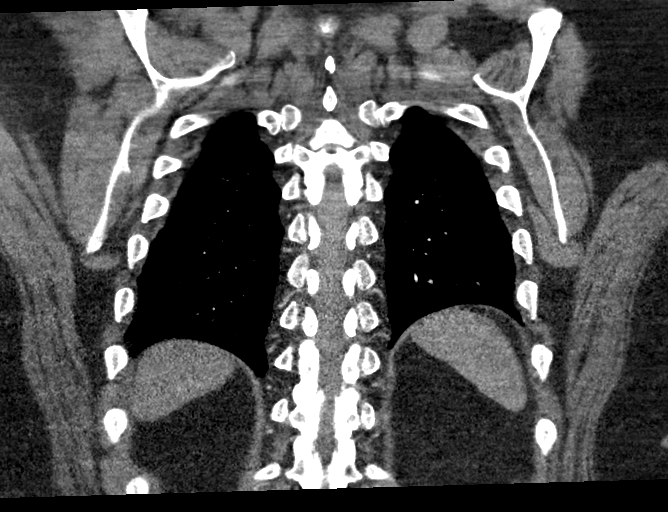

[18 of 46 positions shown; findings below may reference images not displayed]

FINDINGS: Cardiovascular: Saddle pulmonary embolus extending into the
bilateral lobar and proximal segmental arteries are identified.
RV/LV ratio equals 0.91 consistent with right heart strain. Normal
heart size without pericardial effusion. Nonaneurysmal thoracic
aorta without dissection or aneurysm. Unremarkable great vessels..

Mediastinum/Nodes: No enlarged mediastinal, hilar, or axillary lymph
nodes. Thyroid gland, trachea, and esophagus demonstrate no
significant findings.

Lungs/Pleura: Minimal upper lobe pulmonary blebs. No pulmonary
consolidation, dominant mass, effusion or pneumothorax.

Upper Abdomen: Partially included cyst off the upper pole of the
right kidney measuring up to 2.1 cm. No adrenal mass. No acute upper
abdominal abnormality.

Musculoskeletal: Degenerative disc disease midthoracic spine. No
acute nor suspicious osseous abnormalities.

Review of the MIP images confirms the above findings.
IMPRESSION: Positive for acute PE with CT evidence of right heart strain (RV/LV
Ratio = 0.91) consistent with at least submassive (intermediate
risk) PE. The presence of right heart strain has been associated
with an increased risk of morbidity and mortality. Please activate
Code PE by paging [phone]. These results were called by
telephone at the time of interpretation on 02/27/2018 at [DATE] to
Dr. SWET UDAS , who verbally acknowledged these results.

Emphysema ([id number]).

## 2019-04-08 ENCOUNTER — Other Ambulatory Visit: Payer: Self-pay | Admitting: Physician Assistant

## 2019-04-08 DIAGNOSIS — G894 Chronic pain syndrome: Secondary | ICD-10-CM

## 2019-04-08 DIAGNOSIS — M170 Bilateral primary osteoarthritis of knee: Secondary | ICD-10-CM

## 2019-04-08 DIAGNOSIS — M75122 Complete rotator cuff tear or rupture of left shoulder, not specified as traumatic: Secondary | ICD-10-CM

## 2019-04-09 ENCOUNTER — Telehealth: Payer: Self-pay | Admitting: Physician Assistant

## 2019-04-09 NOTE — Telephone Encounter (Signed)
With his previous history of blood clots and cardiac history I think he should go to the emergency room for further evaluation for possible blood clot, heart failure, etc.

## 2019-04-09 NOTE — Telephone Encounter (Signed)
Patient aware and verbalized understanding. °

## 2019-04-09 NOTE — Telephone Encounter (Signed)
Pt says he has been having shortness of breath for about 2 wks, and also having panic attacks because of the breathing. Pt says it is worse at night than during the day. Pt says he has dealt with this before because he has a history of having blood clots, which is what caused breathing problems in the past. Wants to speak with Glenard Haring or nurse for advice.

## 2019-04-10 ENCOUNTER — Other Ambulatory Visit: Payer: Self-pay

## 2019-04-10 ENCOUNTER — Encounter (HOSPITAL_COMMUNITY): Payer: Self-pay | Admitting: Emergency Medicine

## 2019-04-10 ENCOUNTER — Emergency Department (HOSPITAL_COMMUNITY): Payer: Medicaid Other

## 2019-04-10 ENCOUNTER — Emergency Department (HOSPITAL_COMMUNITY)
Admission: EM | Admit: 2019-04-10 | Discharge: 2019-04-10 | Disposition: A | Discharge status: Home / Self Care | Payer: Medicaid Other | Attending: Emergency Medicine | Admitting: Emergency Medicine

## 2019-04-10 DIAGNOSIS — I5032 Chronic diastolic (congestive) heart failure: Secondary | ICD-10-CM | POA: Insufficient documentation

## 2019-04-10 DIAGNOSIS — Z79899 Other long term (current) drug therapy: Secondary | ICD-10-CM | POA: Insufficient documentation

## 2019-04-10 DIAGNOSIS — I2510 Atherosclerotic heart disease of native coronary artery without angina pectoris: Secondary | ICD-10-CM | POA: Insufficient documentation

## 2019-04-10 DIAGNOSIS — R0602 Shortness of breath: Secondary | ICD-10-CM

## 2019-04-10 DIAGNOSIS — N183 Chronic kidney disease, stage 3 unspecified: Secondary | ICD-10-CM | POA: Diagnosis not present

## 2019-04-10 DIAGNOSIS — I130 Hypertensive heart and chronic kidney disease with heart failure and stage 1 through stage 4 chronic kidney disease, or unspecified chronic kidney disease: Secondary | ICD-10-CM | POA: Diagnosis not present

## 2019-04-10 LAB — CBC
HCT: 42.2 % (ref 39.0–52.0)
Hemoglobin: 14.4 g/dL (ref 13.0–17.0)
MCH: 33.3 pg (ref 26.0–34.0)
MCHC: 34.1 g/dL (ref 30.0–36.0)
MCV: 97.5 fL (ref 80.0–100.0)
Platelets: 281 10*3/uL (ref 150–400)
RBC: 4.33 MIL/uL (ref 4.22–5.81)
RDW: 12.2 % (ref 11.5–15.5)
WBC: 7.3 10*3/uL (ref 4.0–10.5)
nRBC: 0 % (ref 0.0–0.2)

## 2019-04-10 LAB — COMPREHENSIVE METABOLIC PANEL
ALT: 26 U/L (ref 0–44)
AST: 23 U/L (ref 15–41)
Albumin: 3.7 g/dL (ref 3.5–5.0)
Alkaline Phosphatase: 58 U/L (ref 38–126)
Anion gap: 10 (ref 5–15)
BUN: 15 mg/dL (ref 6–20)
CO2: 30 mmol/L (ref 22–32)
Calcium: 9 mg/dL (ref 8.9–10.3)
Chloride: 98 mmol/L (ref 98–111)
Creatinine, Ser: 1.22 mg/dL (ref 0.61–1.24)
GFR calc Af Amer: >60 mL/min (ref >60)
GFR calc non Af Amer: >60 mL/min (ref >60)
Glucose, Bld: 141 mg/dL — ABNORMAL HIGH (ref 70–99)
Potassium: 3.4 mmol/L — ABNORMAL LOW (ref 3.5–5.1)
Sodium: 138 mmol/L (ref 135–145)
Total Bilirubin: 0.7 mg/dL (ref 0.3–1.2)
Total Protein: 7.3 g/dL (ref 6.5–8.1)

## 2019-04-10 LAB — BRAIN NATRIURETIC PEPTIDE: B Natriuretic Peptide: 125 pg/mL — ABNORMAL HIGH (ref 0.0–100.0)

## 2019-04-10 MED ORDER — ALBUTEROL SULFATE HFA 108 (90 BASE) MCG/ACT IN AERS
2.0000 | INHALATION_SPRAY | Freq: Once | RESPIRATORY_TRACT | Status: AC
  Administered 2019-04-10: 2 via RESPIRATORY_TRACT
  Filled 2019-04-10: qty 6.7

## 2019-04-10 MED ORDER — POTASSIUM CHLORIDE CRYS ER 20 MEQ PO TBCR: 20.0000 meq | EXTENDED_RELEASE_TABLET | Freq: Once | ORAL | Status: DC

## 2019-04-10 MED ORDER — POTASSIUM CHLORIDE CRYS ER 20 MEQ PO TBCR
40.0000 meq | EXTENDED_RELEASE_TABLET | Freq: Once | ORAL | Status: AC
  Administered 2019-04-10: 15:00:00 40 meq via ORAL
  Filled 2019-04-10: qty 2

## 2019-04-10 NOTE — ED Triage Notes (Signed)
Patient reports SOB and panic attacks x 2 weeks. He states he has had prior history of PE. Speaking in complete sentences O2 96% on RA. NAD noted.

## 2019-04-10 NOTE — Discharge Instructions (Addendum)
Please go to your scheduled appointment on Friday with your primary care provider.  It is important that you discuss your lisinopril as you report that you have been having difficulty with that medication.  Please also discuss with her your increasing anxiety as you may require an SSRI or other treatment.  Finally, please repeat your blood work as you are mildly hypokalemic here in the ED.  Given your wheezing on my exam, consider using her albuterol inhaler as needed to see if that improves symptoms  Your BNP was mildly elevated to 125, however, given lack of leg swelling or recent weight gain, lower suspicion for acute CHF exacerbation.  I do believe that repeat echocardiogram is warranted.  Please call your cardiologist to schedule outpatient echocardiogram.  Your last left ventricular ejection fraction was only mildly limited at 55 to 60%.  Please return to the ED or seek medical attention should develop any new or worsening symptoms.

## 2019-04-10 NOTE — ED Provider Notes (Signed)
[facility] Provider Note   CSN: [id number] Arrival date & time: 04/10/19  1026     History Chief Complaint  Patient presents with  . Shortness of Breath    Bradley Torres is a 59 y.o. male with PMH significant for PE, DVT, HTN, CAD, and panic disorder presents to the ED with a 2-week history of progressively improving shortness of breath symptoms and what he describes as panic attacks.  He reports that his primary care provider sent him here for a heart ultrasound.  Patient reports that last year he had a clot that traveled from his leg through his lungs into his heart and ultimately into his brain causing him to experience a pulmonary embolism, ACS, as well as a stroke.  He denies any residual neurologic deficits.  He has been taking his Xarelto, as prescribed.  He denies any weight gain or extremity swelling.  He reports that for a while was difficult for him to take in large inspiration, however he feels as though it is beginning to improve.  He denies any fevers or chills, recent illness, cough, chest pain, abdominal discomfort, nausea or vomiting, or any other symptoms.   HPI     Past Medical History:  Diagnosis Date  . CAD (coronary artery disease)    a. 12/2017: cath showing 10% Proximal-LAD stenosis with no significant obstructive disease and LVEDP mildly elevated at 18 mm Hg.   Marland Kitchen Chronic back pain   . DVT (deep venous thrombosis) ([place])   . Essential hypertension   . Morbid obesity ([place])   . Panic disorder   . Prediabetes   . Shoulder pain, left    Following fall    Patient Active Problem List   Diagnosis Date Noted  . Dysphagia 10/17/2018  . Epigastric burning sensation 10/17/2018  . Acute on chronic respiratory failure with hypoxemia ([place]) 06/05/2018  . Upper airway cough syndrome 05/10/2018  . 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 Anticoagulated by anticoagulation treatment 03/06/2018  . Hx of non-ST elevation myocardial  infarction (NSTEMI) 03/06/2018  . Chronic diastolic CHF (congestive heart failure) ([place])   . Chronic pain syndrome   . 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 NSTEMI (non-ST elevation myocardial infarction) ([place])   . CKD (chronic kidney disease) stage 3, GFR 30-59 ml/min 01/20/2018  . Morbid obesity with BMI of 50.0-59.9, adult ([place]) 01/20/2018  . NSTEMI (non-ST elevated myocardial infarction) ([place]) 01/19/2018  . Diarrhea 01/19/2018  . Lactic acidosis 01/19/2018  . Hypokalemia 01/19/2018  . Acute kidney injury superimposed on chronic kidney disease ([place]) 01/19/2018  . Injury of right shoulder 11/01/2017  . Disorder of left rotator cuff 11/01/2017  . Candidal intertrigo 11/01/2017  . Hx of adenomatous colonic polyps 07/26/2017  . Family hx of colon cancer 05/03/2017  . LLQ pain 03/14/2017  . Bowel habit changes 03/14/2017  . Prediabetes 11/15/2016  . Benign essential HTN 01/23/2014  . Back pain 01/23/2014  . Obesity, Class III, BMI 40-49.9 (morbid obesity) ([place]) 01/23/2014    Past Surgical History:  Procedure Laterality Date  . Ankle fracture sugery Left   . COLONOSCOPY WITH PROPOFOL N/A 05/29/2017   Procedure: COLONOSCOPY WITH PROPOFOL;  Surgeon: Daneil Dolin, MD;  [facility];  Service: Endoscopy;  Laterality: N/A;  2:00pm  . LEFT HEART CATH AND CORONARY ANGIOGRAPHY N/A 01/23/2018   Procedure: LEFT HEART CATH AND CORONARY ANGIOGRAPHY;  Surgeon: Troy Sine, MD;  Location: [facility];  Service: Cardiovascular;  Laterality: N/A;  . LUMBAR SPINE SURGERY     fusion  . POLYPECTOMY  05/29/2017   Procedure: POLYPECTOMY;  Surgeon: Daneil Dolin, MD;  Location: [facility];  Service: Endoscopy;;  ascending colon polyp x5-cs transverse colon polyp x4- cs descending colon polyp x1- hs sigmoid colon polyp x1 -hs rectal polyp x1- hs  . TONSILLECTOMY         Family History  Problem Relation Age of Onset  . Colon  cancer Mother        died at age 49  . Heart attack Father   . Hepatitis Sister 42       Autoimmune  . Heart attack Paternal Grandfather     Social History   Tobacco Use  . Smoking status: Never Smoker  . Smokeless tobacco: Never Used  Substance Use Topics  . Alcohol use: Yes    Comment: rare.3. Occasionally  . Drug use: Yes    Types: Marijuana    Comment: daily    Home Medications Prior to Admission medications   Medication Sig Start Date End Date Taking? Authorizing Provider  cyclobenzaprine (FLEXERIL) 10 MG tablet TAKE 1 TABLET BY MOUTH THREE TIMES DAILY AS NEEDED 01/03/17   Terald Sleeper, PA-C  lisinopril-hydrochlorothiazide (ZESTORETIC) 20-25 MG tablet Take 1 tablet by mouth daily. 08/07/18   Terald Sleeper, PA-C  metoprolol succinate (TOPROL-XL) 50 MG 24 hr tablet Take 1 tablet (50 mg total) by mouth daily. Take with or immediately following a meal. 01/07/19   Terald Sleeper, PA-C  oxyCODONE-acetaminophen (PERCOCET) 10-325 MG tablet Take 1 tablet by mouth every 8 (eight) hours as needed for pain. 01/07/19   Terald Sleeper, PA-C  oxyCODONE-acetaminophen (PERCOCET) 10-325 MG tablet Take 1 tablet by mouth every 8 (eight) hours as needed for pain. 01/07/19   Terald Sleeper, PA-C  oxyCODONE-acetaminophen (PERCOCET) 10-325 MG tablet Take 1 tablet by mouth every 8 (eight) hours as needed for pain. 01/07/19   Terald Sleeper, PA-C  rivaroxaban (XARELTO) 20 MG TABS tablet TAKE 1 TABLET BY MOUTH DAILY WITH SUPPER 08/07/18   Terald Sleeper, PA-C  terbinafine (LAMISIL) 1 % cream Apply 1 application topically 2 (two) times daily as needed (FOR ITCHY SKIN.). 08/27/17   Terald Sleeper, PA-C    Allergies    Ibuprofen, Diflucan [fluconazole], and Losartan  Review of Systems   Review of Systems  All other systems reviewed and are negative.   Physical Exam Updated Vital Signs BP (!) 146/91 (BP Location: Left Arm)   Pulse 62   Temp 98.2 F (36.8 C) (Oral)   Resp 18   Ht 6' (1.829 m)    Wt (!) 179.2 kg   SpO2 97%   BMI 53.57 kg/m   Physical Exam Vitals and nursing note reviewed. Exam conducted with a chaperone present.  Constitutional:      General: He is not in acute distress.    Appearance: Normal appearance. He is not ill-appearing.  HENT:     Head: Normocephalic and atraumatic.  Eyes:     General: No scleral icterus.    Conjunctiva/sclera: Conjunctivae normal.  Cardiovascular:     Rate and Rhythm: Normal rate and regular rhythm.     Pulses: Normal pulses.     Heart sounds: Normal heart sounds.  Pulmonary:     Comments: Mildly increased respiratory effort, but no accessory muscle use.  Mild wheezing auscultated diffusely.  Breath sounds intact bilaterally. Musculoskeletal:  Cervical back: Normal range of motion and neck supple. No rigidity.  Skin:    General: Skin is dry.     Capillary Refill: Capillary refill takes less than 2 seconds.  Neurological:     Mental Status: He is alert and oriented to person, place, and time.     GCS: GCS eye subscore is 4. GCS verbal subscore is 5. GCS motor subscore is 6.  Psychiatric:        Mood and Affect: Mood normal.        Behavior: Behavior normal.        Thought Content: Thought content normal.     ED Results / Procedures / Treatments   Labs (all labs ordered are listed, but only abnormal results are displayed) Labs Reviewed  BRAIN NATRIURETIC PEPTIDE - Abnormal; Notable for the following components:      Result Value   B Natriuretic Peptide 125.0 (*)    All other components within normal limits  COMPREHENSIVE METABOLIC PANEL - Abnormal; Notable for the following components:   Potassium 3.4 (*)    Glucose, Bld 141 (*)    All other components within normal limits  CBC    EKG EKG Interpretation  Date/Time:  Wednesday April 10 2019 10:52:58 EST Ventricular Rate:  83 PR Interval:  152 QRS Duration: 84 QT Interval:  394 QTC Calculation: 462 R Axis:   55 Text Interpretation: Sinus rhythm with  Premature atrial complexes with Abberant conduction Otherwise normal ECG Confirmed by Elnora Morrison [phone]) on 04/10/2019 12:44:11 PM   Radiology DG Chest 2 View  Result Date: 04/10/2019 CLINICAL DATA:  Short of breath panic attack EXAM: CHEST - 2 VIEW COMPARISON:  None. FINDINGS: Normal mediastinum and cardiac silhouette. Normal pulmonary vasculature. No evidence of effusion, infiltrate, or pneumothorax. No acute bony abnormality. IMPRESSION: No acute cardiopulmonary process. Electronically Signed   By: Suzy Bouchard M.D.   On: 04/10/2019 11:16    Procedures Procedures (including critical care time)  Medications Ordered in ED Medications  albuterol (VENTOLIN HFA) 108 (90 Base) MCG/ACT inhaler 2 puff (2 puffs Inhalation Given 04/10/19 1257)  potassium chloride SA (KLOR-CON) CR tablet 40 mEq (40 mEq Oral Given 04/10/19 1506)    ED Course  I have reviewed the triage vital signs and the nursing notes.  Pertinent labs & imaging results that were available during my care of the patient were reviewed by me and considered in my medical decision making (see chart for details).    MDM Rules/Calculators/A&P                      Patient endorses a history of DVT and PE, he has been taking his Xarelto, as prescribed.  He reports that his shortness of breath symptoms have improved over the course of the past couple of weeks.  His CMP demonstrates a mild hypokalemia to 3.4, but will simply replete with 40 mEq K. Dur here in the ED and then request that he increase his potatoes or bananas until he can have the labs repeated with his primary care provider.  Patient has been given albuterol puffs given his wheezing on physical exam, which he reports mildly improved symptoms.  His BNP is mildly elevated to 125.0.  His primary care provider reportedly wants him to obtain a repeat echocardiogram, with which I agree.  However he does not meet inpatient criteria and he is in no acute distress, so we will  have to refer him to his cardiologist to  have echocardiogram scheduled.  His last echo complete obtained 03/01/2018 demonstrated LVEF 55 to 60%.  Patient denies any leg swelling or recent weight gain and I do not feel as though immediate diuresis is warranted.  In fact, he states that his symptoms have been improving over the course of the past 2 weeks.  Discussed case with Dr. Reather Converse who personally evaluated patient and agrees with plan assessment.  Strict return precautions discussed with patient. All of the evaluation and work-up results were discussed with the patient and any family at bedside. They were provided opportunity to ask any additional questions and have none at this time. They have expressed understanding of verbal discharge instructions as well as return precautions and are agreeable to the plan.    Final Clinical Impression(s) / ED Diagnoses Final diagnoses:  Shortness of breath    Rx / DC Orders ED Discharge Orders    None       Corena Herter, PA-C 04/10/19 1509    Elnora Morrison, MD 04/12/19 1527

## 2019-04-12 ENCOUNTER — Ambulatory Visit (INDEPENDENT_AMBULATORY_CARE_PROVIDER_SITE_OTHER): Payer: Medicaid Other | Admitting: Physician Assistant

## 2019-04-12 ENCOUNTER — Encounter: Payer: Self-pay | Admitting: Physician Assistant

## 2019-04-12 DIAGNOSIS — G894 Chronic pain syndrome: Secondary | ICD-10-CM

## 2019-04-12 DIAGNOSIS — N1831 Chronic kidney disease, stage 3a: Secondary | ICD-10-CM | POA: Diagnosis not present

## 2019-04-12 DIAGNOSIS — I509 Heart failure, unspecified: Secondary | ICD-10-CM

## 2019-04-12 DIAGNOSIS — R0602 Shortness of breath: Secondary | ICD-10-CM

## 2019-04-12 DIAGNOSIS — M75122 Complete rotator cuff tear or rupture of left shoulder, not specified as traumatic: Secondary | ICD-10-CM | POA: Diagnosis not present

## 2019-04-12 DIAGNOSIS — I10 Essential (primary) hypertension: Secondary | ICD-10-CM | POA: Diagnosis not present

## 2019-04-12 DIAGNOSIS — F419 Anxiety disorder, unspecified: Secondary | ICD-10-CM

## 2019-04-12 DIAGNOSIS — M170 Bilateral primary osteoarthritis of knee: Secondary | ICD-10-CM

## 2019-04-12 MED ORDER — HYDROXYZINE PAMOATE 25 MG PO CAPS: 25.0000 mg | ORAL_CAPSULE | Freq: Three times a day (TID) | ORAL | 0 refills | Status: DC | PRN

## 2019-04-12 MED ORDER — OXYCODONE-ACETAMINOPHEN 10-325 MG PO TABS: 1.0000 | ORAL_TABLET | Freq: Three times a day (TID) | ORAL | 0 refills | Status: DC | PRN

## 2019-04-12 MED ORDER — HYDROCHLOROTHIAZIDE 25 MG PO TABS: 25.0000 mg | ORAL_TABLET | Freq: Every day | ORAL | 3 refills | Status: DC

## 2019-04-12 MED ORDER — METOPROLOL SUCCINATE ER 100 MG PO TB24: 100.0000 mg | ORAL_TABLET | Freq: Every day | ORAL | 3 refills | Status: DC

## 2019-04-12 MED ORDER — ALBUTEROL SULFATE HFA 108 (90 BASE) MCG/ACT IN AERS: 2.0000 | INHALATION_SPRAY | Freq: Four times a day (QID) | RESPIRATORY_TRACT | 0 refills | Status: DC | PRN

## 2019-04-12 MED ORDER — BUDESONIDE-FORMOTEROL FUMARATE 80-4.5 MCG/ACT IN AERO: 2.0000 | INHALATION_SPRAY | Freq: Two times a day (BID) | RESPIRATORY_TRACT | 3 refills | Status: DC

## 2019-04-12 NOTE — Progress Notes (Signed)
1124      Telephone visit  Subjective: CC: Shortness of breath, follow-up from emergency room PCP: Bradley Sleeper, PA-C Bradley Torres is a 59 y.o. male calls for telephone consult today. Patient provides verbal consent for consult held via phone.  Patient is identified with 2 separate identifiers.  At this time the entire area is on COVID-19 social distancing and stay home orders are in place.  Patient is of higher risk and therefore we are performing this by a virtual method.  Location of patient: Home Location of provider: [facility] Others present for call: No  This is a 2-day follow-up from the patient's been seen in the emergency room.  There was concern that he could have exacerbation of heart failure or PE.  He had that a year ago.  He has been taking his medication regularly.  He denies any significant chest pain or angina type pain.  He does have shortness of breath is worse after he is up and walking.  He had seen pulmonology in the distant past but only one time.  We discussed getting rid of his lisinopril to see if this is an aggravation of the cough.  His potassium was 0.1 too low they gave him potassium in the hospital visit.  Labs will be ordered and he can come in the next week or 2 to have those updated.  We will also increase his metoprolol to 100 mg daily.  As for the dyspnea I would like for him to use the albuterol 4 times a day that was given to him through the emergency room.  And we will add Symbicort 2 puffs twice daily as a controller medicine.  Hopefully he will be able to back off the albuterol.  It does make him feel somewhat nervous.  Because of that we are going to give him a small amount of Vistaril to use as needed for the anxiety.  He also needs a refill on his oxycodone.  This visit was actually supposed to be of regular 19-monthfollow but had to be changed to hospital follow-up because of his visit there.  We will send 1 prescription and we will plan to  see him in the office in 3 to 4 weeks.   ROS: Per HPI  Allergies  Allergen Reactions  . Ibuprofen Other (See Comments)    Rectal bleed  . Diflucan [Fluconazole] Rash  . Losartan Rash   Past Medical History:  Diagnosis Date  . CAD (coronary artery disease)    a. 12/2017: cath showing 10% Proximal-LAD stenosis with no significant obstructive disease and LVEDP mildly elevated at 18 mm Hg.   .Marland KitchenChronic back pain   . [place]) ([place]   . Essential hypertension   . Morbid obesity ([facility]   . Panic disorder   . Prediabetes   . Shoulder pain, left    Following fall    Current Outpatient Medications:  .  albuterol (VENTOLIN HFA) 108 (90 Base) MCG/ACT inhaler, Inhale 2 puffs into the lungs every 6 (six) hours as needed for wheezing or shortness of breath., Disp: 18 g, Rfl: 0 .  budesonide-formoterol (SYMBICORT) 80-4.5 MCG/ACT inhaler, Inhale 2 puffs into the lungs 2 (two) times daily., Disp: 1 Inhaler, Rfl: 3 .  cyclobenzaprine (FLEXERIL) 10 MG tablet, TAKE 1 TABLET BY MOUTH THREE TIMES DAILY AS NEEDED, Disp: 90 tablet, Rfl: 0 .  hydrochlorothiazide (HYDRODIURIL) 25 MG tablet, Take 1 tablet (25 mg total) by mouth daily., Disp: 90  tablet, Rfl: 3 .  hydrOXYzine (VISTARIL) 25 MG capsule, Take 1 capsule (25 mg total) by mouth 3 (three) times daily as needed for anxiety., Disp: 60 capsule, Rfl: 0 .  metoprolol succinate (TOPROL-XL) 100 MG 24 hr tablet, Take 1 tablet (100 mg total) by mouth daily. Take with or immediately following a meal., Disp: 30 tablet, Rfl: 3 .  oxyCODONE-acetaminophen (PERCOCET) 10-325 MG tablet, Take 1 tablet by mouth every 8 (eight) hours as needed for pain., Disp: 90 tablet, Rfl: 0 .  oxyCODONE-acetaminophen (PERCOCET) 10-325 MG tablet, Take 1 tablet by mouth every 8 (eight) hours as needed for pain., Disp: 90 tablet, Rfl: 0 .  oxyCODONE-acetaminophen (PERCOCET) 10-325 MG tablet, Take 1 tablet by mouth every 8 (eight) hours as needed for pain., Disp: 90  tablet, Rfl: 0 .  rivaroxaban (XARELTO) 20 MG TABS tablet, TAKE 1 TABLET BY MOUTH DAILY WITH SUPPER, Disp: 30 tablet, Rfl: 11 .  terbinafine (LAMISIL) 1 % cream, Apply 1 application topically 2 (two) times daily as needed (FOR ITCHY SKIN.)., Disp: 30 g, Rfl: 5  Assessment/ Plan: 59 y.o. male   1. Chronic pain syndrome - DRUG SCREEN-TOXASSURE; Future  2. Congestive heart failure, unspecified HF chronicity, unspecified heart failure type (HCC) - hydrochlorothiazide (HYDRODIURIL) 25 MG tablet; Take 1 tablet (25 mg total) by mouth daily.  Dispense: 90 tablet; Refill: 3 - metoprolol succinate (TOPROL-XL) 100 MG 24 hr tablet; Take 1 tablet (100 mg total) by mouth daily. Take with or immediately following a meal.  Dispense: 30 tablet; Refill: 3 - Ambulatory referral to Cardiology  3. Benign essential HTN - hydrochlorothiazide (HYDRODIURIL) 25 MG tablet; Take 1 tablet (25 mg total) by mouth daily.  Dispense: 90 tablet; Refill: 3 - metoprolol succinate (TOPROL-XL) 100 MG 24 hr tablet; Take 1 tablet (100 mg total) by mouth daily. Take with or immediately following a meal.  Dispense: 30 tablet; Refill: 3 - CBC with Differential/Platelet; Future - CMP14+EGFR; Future - Lipid Panel; Future - TSH; Future  4. Stage 3a chronic kidney disease - CBC with Differential/Platelet; Future - CMP14+EGFR; Future - Lipid Panel; Future - TSH; Future  5. Shortness of breath - budesonide-formoterol (SYMBICORT) 80-4.5 MCG/ACT inhaler; Inhale 2 puffs into the lungs 2 (two) times daily.  Dispense: 1 Inhaler; Refill: 3 - albuterol (VENTOLIN HFA) 108 (90 Base) MCG/ACT inhaler; Inhale 2 puffs into the lungs every 6 (six) hours as needed for wheezing or shortness of breath.  Dispense: 18 g; Refill: 0  6. Anxiety - hydrOXYzine (VISTARIL) 25 MG capsule; Take 1 capsule (25 mg total) by mouth 3 (three) times daily as needed for anxiety.  Dispense: 60 capsule; Refill: 0   Return in about 3 weeks (around 05/03/2019) for in  office.  Continue all other maintenance medications as listed above.  Start time: 11:24 Am End time: 11:47 AM  Meds ordered this encounter  Medications  . budesonide-formoterol (SYMBICORT) 80-4.5 MCG/ACT inhaler    Sig: Inhale 2 puffs into the lungs 2 (two) times daily.    Dispense:  1 Inhaler    Refill:  3    Order Specific Question:   Supervising Provider    Answer:   Janora Norlander [[id number]]  . hydrochlorothiazide (HYDRODIURIL) 25 MG tablet    Sig: Take 1 tablet (25 mg total) by mouth daily.    Dispense:  90 tablet    Refill:  3    Order Specific Question:   Supervising Provider    Answer:   Janora Norlander [[id number]]  .  metoprolol succinate (TOPROL-XL) 100 MG 24 hr tablet    Sig: Take 1 tablet (100 mg total) by mouth daily. Take with or immediately following a meal.    Dispense:  30 tablet    Refill:  3    Order Specific Question:   Supervising Provider    Answer:   Janora Norlander [[id number]]  . albuterol (VENTOLIN HFA) 108 (90 Base) MCG/ACT inhaler    Sig: Inhale 2 puffs into the lungs every 6 (six) hours as needed for wheezing or shortness of breath.    Dispense:  18 g    Refill:  0    Order Specific Question:   Supervising Provider    Answer:   Janora Norlander [[id number]]  . hydrOXYzine (VISTARIL) 25 MG capsule    Sig: Take 1 capsule (25 mg total) by mouth 3 (three) times daily as needed for anxiety.    Dispense:  60 capsule    Refill:  0    Order Specific Question:   Supervising Provider    Answer:   Janora Norlander [[id number]]    [name] PA-C [place] [phone]

## 2019-04-15 ENCOUNTER — Telehealth: Payer: Self-pay | Admitting: *Deleted

## 2019-04-15 MED ORDER — SYMBICORT 80-4.5 MCG/ACT IN AERO: 2.0000 | INHALATION_SPRAY | Freq: Two times a day (BID) | RESPIRATORY_TRACT | 2 refills | Status: DC

## 2019-04-15 NOTE — Telephone Encounter (Signed)
Prior Auth for oxycodone-acetaminophen 10-325mg -In Process  [employer] form filled out, signed and OV notes faxed over.

## 2019-04-15 NOTE — Addendum Note (Signed)
Addended by: Milas Hock on: 04/15/2019 03:47 PM   Modules accepted: Orders

## 2019-04-16 ENCOUNTER — Telehealth: Payer: Self-pay | Admitting: *Deleted

## 2019-04-16 ENCOUNTER — Telehealth: Payer: Self-pay | Admitting: Physician Assistant

## 2019-04-16 NOTE — Telephone Encounter (Addendum)
Message called and left for patient to call and schedule patient.    ----- Message from Terald Sleeper, PA-C sent at 04/12/2019 11:29 AM EST ----- Please schedule

## 2019-04-18 NOTE — Progress Notes (Signed)
Primary Care Physician:  Terald Sleeper, PA-C  Primary Gastroenterologist:  Garfield Cornea, MD   Chief Complaint  Patient presents with  . Colonoscopy    consult, hx polyps; pain left and right sides    HPI:  Bradley Torres is a 59 y.o. male here to schedule short interval surveillance colonoscopy. Colonoscopy in 05/2017 with multiple tubular adenomas removed, had over 12 polyps removed. Prep was inadequate. Clenpiq used, patient did not feel cleaned out. Procedure has been delayed due to several acute medical issues. In 12/2017, he has NSTEMI, cardiac cath without significant CAD. Sleep apnea newly diagnosed. In 02/2018, he developed right lower extremity DVT, saddle pulmonary embolism. Echo with stable right sided heart failure and chronic pulmonary hypertension. Patient discharged on xarelto. Possible CVA at the time of PE as patient developed left hand and arm weakness and slight left sided facial droop. Patient is following with neurology. MRI of Brain March 2020. Per Dr. 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 frontal area. Previous stroke or trauma can be a cause- this is remote, not acute.  Patient states he was doing pretty well up until the last few weeks.  Seems to happen every winter.  Feeling somewhat short of breath again.  Not bad but definitely not his baseline.  Went to the ED on January 13.  Chest x-ray was unremarkable.  He has had follow-up with PCP, recently started on Albuterol and Symbicort. In the ED last week. Sees 05/06/19 with cardiologist. Sees ECP on 05/13/19. Still needs CPAP.  States he needed a titration study before getting his CPAP, but this was postponed due to the pandemic.  They have tried to reschedule him in August but he did not feel that he could complete quarantine time needed for testing.  He is very interested in pursuing his colonoscopy noting that he is nearly 18 months later at this point.  From a GI standpoint he is  doing fairly well.  Mild flank pain at times, worse with meals and when he has a "full belly".  Just knows it is there.  Nothing significant.  Bowel movements are regular.  No blood in the stool or melena.  Bowel movement every day to every other day.  Feels like he is going much better than in the past.  Denies daytime heartburn.  Occasionally has heartburn at nighttime.  Can change positions or drink milk and it goes away.  Waits 2 to 3 hours between eating and laying down.  Does not feel like he needs any medications at this time.   Current Outpatient Medications  Medication Sig Dispense Refill  . albuterol (VENTOLIN HFA) 108 (90 Base) MCG/ACT inhaler Inhale 2 puffs into the lungs every 6 (six) hours as needed for wheezing or shortness of breath. 18 g 0  . cyclobenzaprine (FLEXERIL) 10 MG tablet TAKE 1 TABLET BY MOUTH THREE TIMES DAILY AS NEEDED 90 tablet 0  . hydrochlorothiazide (HYDRODIURIL) 25 MG tablet Take 1 tablet (25 mg total) by mouth daily. 90 tablet 3  . hydrOXYzine (VISTARIL) 25 MG capsule Take 1 capsule (25 mg total) by mouth 3 (three) times daily as needed for anxiety. 60 capsule 0  . metoprolol succinate (TOPROL-XL) 100 MG 24 hr tablet Take 1 tablet (100 mg total) by mouth daily. Take with or immediately following a meal. 30 tablet 3  . oxyCODONE-acetaminophen (PERCOCET) 10-325 MG tablet Take 1 tablet by mouth every 8 (eight) hours as needed for pain. 90 tablet  0  . oxyCODONE-acetaminophen (PERCOCET) 10-325 MG tablet Take 1 tablet by mouth every 8 (eight) hours as needed for pain. 90 tablet 0  . oxyCODONE-acetaminophen (PERCOCET) 10-325 MG tablet Take 1 tablet by mouth every 8 (eight) hours as needed for pain. 90 tablet 0  . rivaroxaban (XARELTO) 20 MG TABS tablet TAKE 1 TABLET BY MOUTH DAILY WITH SUPPER 30 tablet 11  . SYMBICORT 80-4.5 MCG/ACT inhaler Inhale 2 puffs into the lungs 2 (two) times daily. 1 Inhaler 2  . terbinafine (LAMISIL) 1 % cream Apply 1 application topically 2  (two) times daily as needed (FOR ITCHY SKIN.). 30 g 5   No current facility-administered medications for this visit.    Allergies as of 04/19/2019 - Review Complete 04/19/2019  Allergen Reaction Noted  . Ibuprofen Other (See Comments) 11/20/2013  . Diflucan [fluconazole] Rash 01/19/2018  . Losartan Rash 03/17/2016    Past Medical History:  Diagnosis Date  . CAD (coronary artery disease)    a. 12/2017: cath showing 10% Proximal-LAD stenosis with no significant obstructive disease and LVEDP mildly elevated at 18 mm Hg.   Marland Kitchen Chronic back pain   . DVT (deep venous thrombosis) ([place])   . Essential hypertension   . Morbid obesity ([place])   . Panic disorder   . Prediabetes   . Shoulder pain, left    Following fall    Past Surgical History:  Procedure Laterality Date  . Ankle fracture sugery Left   . COLONOSCOPY WITH PROPOFOL N/A 05/29/2017   Dr. [name]: Multiple tubular adenomas removed, prep inadequate.  Short interval surveillance colonoscopy recommended in 6 months  . LEFT HEART CATH AND CORONARY ANGIOGRAPHY N/A 01/23/2018   Procedure: LEFT HEART CATH AND CORONARY ANGIOGRAPHY;  Surgeon: Troy Sine, MD;  Location: [facility];  Service: Cardiovascular;  Laterality: N/A;  . LUMBAR SPINE SURGERY     fusion  . POLYPECTOMY  05/29/2017   Procedure: POLYPECTOMY;  Surgeon: Daneil Dolin, MD;  Location: [facility];  Service: Endoscopy;;  ascending colon polyp x5-cs transverse colon polyp x4- cs descending colon polyp x1- hs sigmoid colon polyp x1 -hs rectal polyp x1- hs  . TONSILLECTOMY      Family History  Problem Relation Age of Onset  . Colon cancer Mother        died at age 80  . Heart attack Father   . Hepatitis Sister 42       Autoimmune  . Heart attack Paternal Grandfather     Social History   Socioeconomic History  . Marital status: Single    Spouse name: Not on file  . Number of children: Not on file  . Years of education: Not on file  . Highest  education level: Not on file  Occupational History  . Not on file  Tobacco Use  . Smoking status: Never Smoker  . Smokeless tobacco: Never Used  Substance and Sexual Activity  . Alcohol use: Yes    Comment: occ  . Drug use: Not Currently    Types: Marijuana    Comment: daily; 04/19/19 none past few weeks d/t problems breathing  . Sexual activity: Not Currently    Birth control/protection: None  Other Topics Concern  . Not on file  Social History Narrative  . Not on file   Social Determinants of Health   Financial Resource Strain:   . Difficulty of Paying Living Expenses: Not on file  Food Insecurity:   . Worried About [employer] of  Food in the Last Year: Not on file  . Ran Out of Food in the Last Year: Not on file  Transportation Needs:   . Lack of Transportation (Medical): Not on file  . Lack of Transportation (Non-Medical): Not on file  Physical Activity:   . Days of Exercise per Week: Not on file  . Minutes of Exercise per Session: Not on file  Stress:   . Feeling of Stress : Not on file  Social Connections:   . Frequency of Communication with Friends and Family: Not on file  . Frequency of Social Gatherings with Friends and Family: Not on file  . Attends Religious Services: Not on file  . Active Member of Clubs or Organizations: Not on file  . Attends Archivist Meetings: Not on file  . Marital Status: Not on file  Intimate Partner Violence:   . Fear of Current or Ex-Partner: Not on file  . Emotionally Abused: Not on file  . Physically Abused: Not on file  . Sexually Abused: Not on file      ROS:  General: Negative for anorexia, weight loss, fever, chills, fatigue, weakness. Eyes: Negative for vision changes.  ENT: Negative for hoarseness, difficulty swallowing , nasal congestion. CV: Negative for chest pain, angina, palpitations, peripheral edema. +DOE Respiratory: Negative for dyspnea at rest, cough, sputum, wheezing.+DOE  GI: See history of  present illness. GU:  Negative for dysuria, hematuria, urinary incontinence, urinary frequency, nocturnal urination.  MS: Negative for joint pain, low back pain.  Derm: Negative for rash or itching.  Neuro: Negative for weakness, abnormal sensation, seizure, frequent headaches, memory loss, confusion.  Psych: Negative for  depression, suicidal ideation, hallucinations. +anxiety Endo: Negative for unusual weight change.  Heme: Negative for bruising or bleeding. Allergy: Negative for rash or hives.    Physical Examination:  BP (!) 158/86   Pulse 67   Temp (!) 96.9 F (36.1 C) (Temporal)   Ht 6' (1.829 m)   Wt (!) 394 lb (178.7 kg)   BMI 53.44 kg/m    General: morbidly obese, well-developed in no acute distress.  Head: Normocephalic, atraumatic.   Eyes: Conjunctiva pink, no icterus. Mouth: masked. Neck: Supple   Lungs: Clear to auscultation bilaterally.  Heart: Regular rate and rhythm, no murmurs rubs or gallops.  Abdomen: Bowel sounds are normal, nontender, nondistended, no hepatosplenomegaly or masses, no abdominal bruits or    hernia , no rebound or guarding.  Exam limited due to body habitus. Rectal: deferred Extremities: No lower extremity edema. No clubbing or deformities.  Neuro: Alert and oriented x 4 , grossly normal neurologically.  Skin: Warm and dry, no rash or jaundice.   Psych: Alert and cooperative, normal mood and affect.  Labs: Lab Results  Component Value Date   CREATININE 1.22 04/10/2019   BUN 15 04/10/2019   NA 138 04/10/2019   K 3.4 (L) 04/10/2019   CL 98 04/10/2019   CO2 30 04/10/2019   Lab Results  Component Value Date   ALT 26 04/10/2019   AST 23 04/10/2019   ALKPHOS 58 04/10/2019   BILITOT 0.7 04/10/2019   Lab Results  Component Value Date   WBC 7.3 04/10/2019   HGB 14.4 04/10/2019   HCT 42.2 04/10/2019   MCV 97.5 04/10/2019   PLT 281 04/10/2019   Lab Results  Component Value Date   TSH 2.310 10/17/2018     Imaging Studies: DG  Chest 2 View  Result Date: 04/10/2019 CLINICAL DATA:  Short of  breath panic attack EXAM: CHEST - 2 VIEW COMPARISON:  None. FINDINGS: Normal mediastinum and cardiac silhouette. Normal pulmonary vasculature. No evidence of effusion, infiltrate, or pneumothorax. No acute bony abnormality. IMPRESSION: No acute cardiopulmonary process. Electronically Signed   By: Suzy Bouchard M.D.   On: 04/10/2019 11:16

## 2019-04-19 ENCOUNTER — Encounter: Payer: Self-pay | Admitting: Gastroenterology

## 2019-04-19 ENCOUNTER — Ambulatory Visit (INDEPENDENT_AMBULATORY_CARE_PROVIDER_SITE_OTHER): Payer: Medicaid Other | Admitting: Gastroenterology

## 2019-04-19 ENCOUNTER — Other Ambulatory Visit: Payer: Self-pay

## 2019-04-19 VITALS — BP 158/86 | HR 67 | Temp 96.9°F | Ht 72.0 in | Wt 394.0 lb

## 2019-04-19 DIAGNOSIS — Z86010 Personal history of colonic polyps: Secondary | ICD-10-CM

## 2019-04-19 NOTE — Assessment & Plan Note (Addendum)
History of multiple adenomatous colon polyps with incomplete colonoscopy in March 2019 due to poor prep (Clenpiq).  Recommended to have 75-month follow-up but this is been delayed due to numerous medical issues.  Mother passed from colon cancer at age 58.  From a GI perspective he is doing well.  He has follow-up with PCP, cardiology in the near future.  He has a history of NSTEMI, DVT, saddle PE, stroke in 2019.  He has sleep apnea but has not completed second sleep study for CPAP titration, I have reached out to PCP to see if she can help him get this rescheduled.  Would like to have sleep apnea treated prior to endoscopic evaluation in light of his multiple comorbidities.  Once we see when he is scheduled for titration study, we can get him on the schedule for colonoscopy with propofol. Consider daily dulcolax for 3 days before colonoscopy and/or Linzess. Avoid Clenpiq/Plenvu preps.

## 2019-04-19 NOTE — Patient Instructions (Signed)
1. We will reach out to you and schedule your colonoscopy once you are scheduled for your sleep apnea test. I will reach out to [facility] and update her as discussed today.

## 2019-04-22 ENCOUNTER — Other Ambulatory Visit: Payer: Self-pay | Admitting: Physician Assistant

## 2019-04-22 DIAGNOSIS — F419 Anxiety disorder, unspecified: Secondary | ICD-10-CM

## 2019-04-23 NOTE — Telephone Encounter (Signed)
Prior Auth for oxycodone-acetaminophen 10-325mg -APPROVED till 10/12/19  PA# [id number]  [facility]   Pharmacy notified

## 2019-04-30 ENCOUNTER — Telehealth: Payer: Self-pay | Admitting: Physician Assistant

## 2019-04-30 NOTE — Telephone Encounter (Signed)
Televisit made for stress

## 2019-04-30 NOTE — Telephone Encounter (Signed)
Pt called wanting to know if he could be seen sooner by Glenard Haring than is current appt for 05/13/19. In appt notes, it says pt is having breathing issues but that Glenard Haring ok'd for pt to come in ?? Tried confirming this with pt and going over COVID screening questions, and pt got very rude with me. Said his breathing had nothing to do with COVID. Told him Id get a nurse to confirm with Glenard Haring whether he can come in and if he can come in sooner.

## 2019-04-30 NOTE — Telephone Encounter (Signed)
PAtient declined early appt

## 2019-04-30 NOTE — Telephone Encounter (Signed)
Please just call and screen his issue. He does have asthma.copd.hypoxia.

## 2019-05-01 ENCOUNTER — Encounter: Payer: Self-pay | Admitting: Physician Assistant

## 2019-05-01 ENCOUNTER — Ambulatory Visit (INDEPENDENT_AMBULATORY_CARE_PROVIDER_SITE_OTHER): Payer: Medicaid Other | Admitting: Physician Assistant

## 2019-05-01 DIAGNOSIS — N1831 Chronic kidney disease, stage 3a: Secondary | ICD-10-CM | POA: Diagnosis not present

## 2019-05-01 DIAGNOSIS — T464X5A Adverse effect of angiotensin-converting-enzyme inhibitors, initial encounter: Secondary | ICD-10-CM | POA: Diagnosis not present

## 2019-05-01 DIAGNOSIS — F419 Anxiety disorder, unspecified: Secondary | ICD-10-CM

## 2019-05-01 DIAGNOSIS — I10 Essential (primary) hypertension: Secondary | ICD-10-CM

## 2019-05-01 DIAGNOSIS — G894 Chronic pain syndrome: Secondary | ICD-10-CM | POA: Diagnosis not present

## 2019-05-01 DIAGNOSIS — F321 Major depressive disorder, single episode, moderate: Secondary | ICD-10-CM | POA: Diagnosis not present

## 2019-05-01 DIAGNOSIS — I509 Heart failure, unspecified: Secondary | ICD-10-CM

## 2019-05-01 DIAGNOSIS — R0602 Shortness of breath: Secondary | ICD-10-CM | POA: Diagnosis not present

## 2019-05-01 DIAGNOSIS — R05 Cough: Secondary | ICD-10-CM

## 2019-05-01 DIAGNOSIS — R0600 Dyspnea, unspecified: Secondary | ICD-10-CM

## 2019-05-01 DIAGNOSIS — I5032 Chronic diastolic (congestive) heart failure: Secondary | ICD-10-CM | POA: Diagnosis not present

## 2019-05-01 DIAGNOSIS — G4730 Sleep apnea, unspecified: Secondary | ICD-10-CM

## 2019-05-01 DIAGNOSIS — R058 Other specified cough: Secondary | ICD-10-CM

## 2019-05-01 DIAGNOSIS — R0609 Other forms of dyspnea: Secondary | ICD-10-CM

## 2019-05-01 MED ORDER — HYDROCHLOROTHIAZIDE 25 MG PO TABS: 50.0000 mg | ORAL_TABLET | Freq: Every day | ORAL | 3 refills | Status: DC

## 2019-05-01 MED ORDER — HYDROXYZINE HCL 25 MG PO TABS: 25.0000 mg | ORAL_TABLET | Freq: Three times a day (TID) | ORAL | 0 refills | Status: DC | PRN

## 2019-05-01 MED ORDER — DULOXETINE HCL 30 MG PO CPEP: 30.0000 mg | ORAL_CAPSULE | Freq: Every day | ORAL | 1 refills | Status: DC

## 2019-05-01 NOTE — Progress Notes (Signed)
150 pm 235       Telephone visit  Subjective: CC: Multiple chronic medical conditions PCP: Terald Sleeper, PA-C [id number] Bradley Torres is a 59 y.o. male calls for telephone consult today. Patient provides verbal consent for consult held via phone.  Patient is identified with 2 separate identifiers.  At this time the entire area is on COVID-19 social distancing and stay home orders are in place.  Patient is of higher risk and therefore we are performing this by a virtual method.  Location of patient: Home Location of provider: HOME Others present for call: No  2/20 pot Patient is having a follow-up for his chronic medical conditions which are very multiple.  He does have known heart disease, heart failure, hypertension.  He had a history of a DVT last year.  Also a history of an NSTEMI.  He does have cardiology.  He is also found to have some chronic kidney disease, chronic cough that they have not completely worked out if it is fully related to his heart failure, lung or GERD related condition.  He states he still continues with some cough.  He states his weight is stable he went to gastroenterology last week and they are planning a follow-up for his colonoscopy which needs to be performed.  However they were waiting for him to have a sleep apnea evaluation or treatment completed for that.  He did have a good initial test.  We will try to get him back to his sleep specialist to have this finalized.  As far as his breathing he felt like Symbicort made his breathing worse, that he could not take a deep breath and whenever he had used it.  So he stopped to use it.  He only uses albuterol as needed.  He states that it does make his heart race Uncle died 07-09-22 Cannot get out  Obstructive sleep apnea Follow-up with sleep specialist as soon as possible to be able to be cleared for colonoscopy.  History of polyps in the past  The patient has smoked marijuana for 40 years he states.  He quit  off and on.  And had completely quit in the last few weeks.  He states he misses it significantly and it did help with his pain and depression.  Continue to do this.  Because it was hurting other things.   ROS: Per HPI  Allergies  Allergen Reactions  . Ibuprofen Other (See Comments)    Rectal bleed  . Diflucan [Fluconazole] Rash  . Losartan Rash   Past Medical History:  Diagnosis Date  . CAD (coronary artery disease)    a. 12/2017: cath showing 10% Proximal-LAD stenosis with no significant obstructive disease and LVEDP mildly elevated at 18 mm Hg.   Marland Kitchen Chronic back pain   . DVT (deep venous thrombosis) ([place])   . Essential hypertension   . Morbid obesity ([place])   . Panic disorder   . Prediabetes   . Shoulder pain, left    Following fall    Current Outpatient Medications:  .  albuterol (VENTOLIN HFA) 108 (90 Base) MCG/ACT inhaler, Inhale 2 puffs into the lungs every 6 (six) hours as needed for wheezing or shortness of breath., Disp: 18 g, Rfl: 0 .  cyclobenzaprine (FLEXERIL) 10 MG tablet, TAKE 1 TABLET BY MOUTH THREE TIMES DAILY AS NEEDED, Disp: 90 tablet, Rfl: 0 .  DULoxetine (CYMBALTA) 30 MG capsule, Take 1 capsule (30 mg total) by mouth daily., Disp: 30 capsule, Rfl: 1 .  hydrochlorothiazide (HYDRODIURIL) 25 MG tablet, Take 2 tablets (50 mg total) by mouth daily., Disp: 180 tablet, Rfl: 3 .  hydrOXYzine (VISTARIL) 25 MG capsule, TAKE ONE CAPSULE BY MOUTH THREE TIMES DAILY AS NEEDED FOR ANXIETY, Disp: 60 capsule, Rfl: 0 .  metoprolol succinate (TOPROL-XL) 100 MG 24 hr tablet, Take 1 tablet (100 mg total) by mouth daily. Take with or immediately following a meal., Disp: 30 tablet, Rfl: 3 .  oxyCODONE-acetaminophen (PERCOCET) 10-325 MG tablet, Take 1 tablet by mouth every 8 (eight) hours as needed for pain., Disp: 90 tablet, Rfl: 0 .  rivaroxaban (XARELTO) 20 MG TABS tablet, TAKE 1 TABLET BY MOUTH DAILY WITH SUPPER, Disp: 30 tablet, Rfl: 11 .  SYMBICORT 80-4.5 MCG/ACT inhaler, Inhale 2  puffs into the lungs 2 (two) times daily., Disp: 1 Inhaler, Rfl: 2 .  terbinafine (LAMISIL) 1 % cream, Apply 1 application topically 2 (two) times daily as needed (FOR ITCHY SKIN.)., Disp: 30 g, Rfl: 5 .  hydrOXYzine (ATARAX/VISTARIL) 25 MG tablet, Take 1 tablet (25 mg total) by mouth 3 (three) times daily as needed., Disp: 30 tablet, Rfl: 0  Assessment/ Plan: 59 y.o. male   1. Congestive heart failure, unspecified HF chronicity, unspecified heart failure type (HCC) - hydrochlorothiazide (HYDRODIURIL) 25 MG tablet; Take 2 tablets (50 mg total) by mouth daily.  Dispense: 180 tablet; Refill: 3  2. Benign essential HTN - hydrochlorothiazide (HYDRODIURIL) 25 MG tablet; Take 2 tablets (50 mg total) by mouth daily.  Dispense: 180 tablet; Refill: 3 - Referral to Chronic Care Management Services  3. Shortness of breath - Ambulatory referral to Pulmonology  4. Cough due to ACE inhibitor - Ambulatory referral to Pulmonology  5. Dyspnea on exertion - Ambulatory referral to Pulmonology  6. Anxiety - DULoxetine (CYMBALTA) 30 MG capsule; Take 1 capsule (30 mg total) by mouth daily.  Dispense: 30 capsule; Refill: 1 - Referral to Chronic Care Management Services  7. Depression, major, single episode, moderate (HCC) - DULoxetine (CYMBALTA) 30 MG capsule; Take 1 capsule (30 mg total) by mouth daily.  Dispense: 30 capsule; Refill: 1 - Referral to Chronic Care Management Services  8. Chronic diastolic CHF (congestive heart failure) ([place]) - Referral to Chronic Care Management Services  9. Stage 3a chronic kidney disease - Referral to Chronic Care Management Services  10. Chronic pain syndrome - Referral to Chronic Care Management Services  11. Sleep apnea, unspecified type - Ambulatory referral to Sleep Studies    No follow-ups on file.  Continue all other maintenance medications as listed above.  Start time: 1:50 PM End time: 2:35 PM  Meds ordered this encounter  Medications  .  hydrochlorothiazide (HYDRODIURIL) 25 MG tablet    Sig: Take 2 tablets (50 mg total) by mouth daily.    Dispense:  180 tablet    Refill:  3    Order Specific Question:   Supervising Provider    Answer:   Janora Norlander [id number]  . DULoxetine (CYMBALTA) 30 MG capsule    Sig: Take 1 capsule (30 mg total) by mouth daily.    Dispense:  30 capsule    Refill:  1    Order Specific Question:   Supervising Provider    Answer:   Janora Norlander [id number]  . hydrOXYzine (ATARAX/VISTARIL) 25 MG tablet    Sig: Take 1 tablet (25 mg total) by mouth 3 (three) times daily as needed.    Dispense:  30 tablet    Refill:  0  Order Specific Question:   Supervising Provider    Answer:   Janora Norlander [id number]    [name] PA-C [place] [phone]

## 2019-05-06 ENCOUNTER — Telehealth: Payer: Self-pay | Admitting: Cardiovascular Disease

## 2019-05-06 ENCOUNTER — Ambulatory Visit (INDEPENDENT_AMBULATORY_CARE_PROVIDER_SITE_OTHER): Payer: Medicaid Other | Admitting: Cardiovascular Disease

## 2019-05-06 ENCOUNTER — Other Ambulatory Visit: Payer: Self-pay

## 2019-05-06 ENCOUNTER — Encounter: Payer: Self-pay | Admitting: Cardiovascular Disease

## 2019-05-06 VITALS — BP 130/85 | HR 76 | Ht 70.0 in | Wt >= 6400 oz

## 2019-05-06 DIAGNOSIS — G4733 Obstructive sleep apnea (adult) (pediatric): Secondary | ICD-10-CM | POA: Diagnosis not present

## 2019-05-06 DIAGNOSIS — I272 Pulmonary hypertension, unspecified: Secondary | ICD-10-CM | POA: Diagnosis not present

## 2019-05-06 DIAGNOSIS — I2510 Atherosclerotic heart disease of native coronary artery without angina pectoris: Secondary | ICD-10-CM

## 2019-05-06 DIAGNOSIS — I10 Essential (primary) hypertension: Secondary | ICD-10-CM | POA: Diagnosis not present

## 2019-05-06 DIAGNOSIS — I2584 Coronary atherosclerosis due to calcified coronary lesion: Secondary | ICD-10-CM

## 2019-05-06 DIAGNOSIS — I2692 Saddle embolus of pulmonary artery without acute cor pulmonale: Secondary | ICD-10-CM

## 2019-05-06 DIAGNOSIS — R002 Palpitations: Secondary | ICD-10-CM | POA: Diagnosis not present

## 2019-05-06 NOTE — Telephone Encounter (Signed)
Pre-cert Verification for the following procedure    echo w/ contrast - [facility] - pulmonary HTN  Scheduled 05/10/2019 at [facility]

## 2019-05-06 NOTE — Patient Instructions (Addendum)
Medication Instructions:  Continue all current medications.  Labwork: none   Testing/Procedures:  Your physician has requested that you have an echocardiogram with contrast.  Echocardiography is a painless test that uses sound waves to create images of your heart. It provides your doctor with information about the size and shape of your heart and how well your heart's chambers and valves are working. This procedure takes approximately one hour. There are no restrictions for this procedure.  Office will contact with results via phone or letter.    Follow-Up: Your physician wants you to follow up in:  1 year.  You will receive a reminder letter in the mail one-two months in advance.  If you don't receive a letter, please call our office to schedule the follow up appointment.    Any Other Special Instructions Will Be Listed Below (If Applicable).  If you need a refill on your cardiac medications before your next appointment, please call your pharmacy.

## 2019-05-06 NOTE — Progress Notes (Addendum)
SUBJECTIVE: The patient presents for routine follow-up.  Past medical history includes morbid obesity, saddle pulmonary embolism, pulmonary hypertension, palpitations, and coronary artery calcifications.  He also has a history of anxiety and panic attacks.  He describes "breathing spells "which have happened more frequently during the winter.  He seldom has palpitations.  He said during the wintertime he is confined to one room and he becomes claustrophobic leading to increasing anxiety.  He also describes a lump in his throat.  He denies dysphagia for solids and liquids.  He denies leg swelling, orthopnea, and paroxysmal nocturnal dyspnea.  He has not followed up with a sleep specialist.    Review of Systems: As per "subjective", otherwise negative.  Allergies  Allergen Reactions  . Ibuprofen Other (See Comments)    Rectal bleed  . Diflucan [Fluconazole] Rash  . Losartan Rash    Current Outpatient Medications  Medication Sig Dispense Refill  . albuterol (VENTOLIN HFA) 108 (90 Base) MCG/ACT inhaler Inhale 2 puffs into the lungs every 6 (six) hours as needed for wheezing or shortness of breath. 18 g 0  . cyclobenzaprine (FLEXERIL) 10 MG tablet TAKE 1 TABLET BY MOUTH THREE TIMES DAILY AS NEEDED 90 tablet 0  . DULoxetine (CYMBALTA) 30 MG capsule Take 1 capsule (30 mg total) by mouth daily. 30 capsule 1  . hydrochlorothiazide (HYDRODIURIL) 25 MG tablet Take 2 tablets (50 mg total) by mouth daily. (Patient taking differently: Take 25 mg by mouth daily. ) 180 tablet 3  . metoprolol succinate (TOPROL-XL) 100 MG 24 hr tablet Take 1 tablet (100 mg total) by mouth daily. Take with or immediately following a meal. 30 tablet 3  . oxyCODONE-acetaminophen (PERCOCET) 10-325 MG tablet Take 1 tablet by mouth every 8 (eight) hours as needed for pain. 90 tablet 0  . rivaroxaban (XARELTO) 20 MG TABS tablet TAKE 1 TABLET BY MOUTH DAILY WITH SUPPER 30 tablet 11  . SYMBICORT 80-4.5 MCG/ACT inhaler  Inhale 2 puffs into the lungs 2 (two) times daily. 1 Inhaler 2  . terbinafine (LAMISIL) 1 % cream Apply 1 application topically 2 (two) times daily as needed (FOR ITCHY SKIN.). 30 g 5  . hydrOXYzine (ATARAX/VISTARIL) 25 MG tablet Take 1 tablet (25 mg total) by mouth 3 (three) times daily as needed. (Patient not taking: Reported on 05/06/2019) 30 tablet 0   No current facility-administered medications for this visit.    Past Medical History:  Diagnosis Date  . CAD (coronary artery disease)    a. 12/2017: cath showing 10% Proximal-LAD stenosis with no significant obstructive disease and LVEDP mildly elevated at 18 mm Hg.   Marland Kitchen Chronic back pain   . DVT (deep venous thrombosis) ([place])   . Essential hypertension   . Morbid obesity ([place])   . Panic disorder   . Prediabetes   . Shoulder pain, left    Following fall    Past Surgical History:  Procedure Laterality Date  . Ankle fracture sugery Left   . COLONOSCOPY WITH PROPOFOL N/A 05/29/2017   Dr. [name]: Multiple tubular adenomas removed, prep inadequate.  Short interval surveillance colonoscopy recommended in 6 months  . LEFT HEART CATH AND CORONARY ANGIOGRAPHY N/A 01/23/2018   Procedure: LEFT HEART CATH AND CORONARY ANGIOGRAPHY;  Surgeon: Troy Sine, MD;  Location: [facility];  Service: Cardiovascular;  Laterality: N/A;  . LUMBAR SPINE SURGERY     fusion  . POLYPECTOMY  05/29/2017   Procedure: POLYPECTOMY;  Surgeon: Daneil Dolin,  MD;  Location: [facility];  Service: Endoscopy;;  ascending colon polyp x5-cs transverse colon polyp x4- cs descending colon polyp x1- hs sigmoid colon polyp x1 -hs rectal polyp x1- hs  . TONSILLECTOMY      Social History   Socioeconomic History  . Marital status: Single    Spouse name: Not on file  . Number of children: Not on file  . Years of education: Not on file  . Highest education level: Not on file  Occupational History  . Not on file  Tobacco Use  . Smoking status: Never Smoker    . Smokeless tobacco: Never Used  Substance and Sexual Activity  . Alcohol use: Yes    Comment: occ  . Drug use: Not Currently    Types: Marijuana    Comment: daily; 04/19/19 none past few weeks d/t problems breathing  . Sexual activity: Not Currently    Birth control/protection: None  Other Topics Concern  . Not on file  Social History Narrative  . Not on file   Social Determinants of Health   Financial Resource Strain:   . Difficulty of Paying Living Expenses: Not on file  Food Insecurity:   . Worried About Charity fundraiser in the Last Year: Not on file  . Ran Out of Food in the Last Year: Not on file  Transportation Needs:   . Lack of Transportation (Medical): Not on file  . Lack of Transportation (Non-Medical): Not on file  Physical Activity:   . Days of Exercise per Week: Not on file  . Minutes of Exercise per Session: Not on file  Stress:   . Feeling of Stress : Not on file  Social Connections:   . Frequency of Communication with Friends and Family: Not on file  . Frequency of Social Gatherings with Friends and Family: Not on file  . Attends Religious Services: Not on file  . Active Member of Clubs or Organizations: Not on file  . Attends Archivist Meetings: Not on file  . Marital Status: Not on file  Intimate Partner Violence:   . Fear of Current or Ex-Partner: Not on file  . Emotionally Abused: Not on file  . Physically Abused: Not on file  . Sexually Abused: Not on file     Vitals:   05/06/19 1316 05/06/19 1323  BP: 139/86 130/85  Pulse: 78 76  SpO2: 97% 96%  Weight: (!) 401 lb (181.9 kg)   Height: 5\' 10"  (1.778 m)     Wt Readings from Last 3 Encounters:  05/06/19 (!) 401 lb (181.9 kg)  04/19/19 (!) 394 lb (178.7 kg)  04/10/19 (!) 395 lb (179.2 kg)     PHYSICAL EXAM General: Morbidly obese male in NAD HEENT: Normal. Neck: No JVD, no thyromegaly. Lungs: Clear to auscultation bilaterally with normal respiratory effort. CV: Regular  rate and rhythm, normal S1/S2, no S3/S4, no murmur. No pretibial or periankle edema.     Abdomen: Soft, nontender, obese.  Neurologic: Alert and oriented.  Psych: Normal affect. Skin: Normal. Musculoskeletal: No gross deformities.      Labs: Lab Results  Component Value Date/Time   K 3.4 (L) 04/10/2019 01:14 PM   BUN 15 04/10/2019 01:14 PM   BUN 18 01/07/2019 11:34 AM   CREATININE 1.22 04/10/2019 01:14 PM   ALT 26 04/10/2019 01:14 PM   TSH 2.310 10/17/2018 12:32 PM   HGB 14.4 04/10/2019 01:14 PM   HGB 14.7 10/17/2018 12:32 PM  Lipids: Lab Results  Component Value Date/Time   LDLCALC 123 (H) 01/07/2019 11:34 AM   CHOL 183 01/07/2019 11:34 AM   TRIG 114 01/07/2019 11:34 AM   HDL 39 (L) 01/07/2019 11:34 AM      Echocardiogram: 01/23/2018 Study Conclusions  - Left ventricle: The cavity size was normal. Systolic function was normal. The estimated ejection fraction was in the range of 60% to 65%. Wall motion was normal; there were no regional wall motion abnormalities. The study was not technically sufficient to allow evaluation of LV diastolic dysfunction due to atrial fibrillation. - Aortic valve: Trileaflet; normal thickness, mildly calcified leaflets. Valve area (VTI): 2.65 cm^2. Valve area (Vmean): 3.21 cm^2. - Mitral valve: Calcified annulus. - Right ventricle: The cavity size was moderately dilated. Wall thickness was normal. - Right atrium: The atrium was mildly dilated. - Tricuspid valve: There was moderate regurgitation. - Pulmonary arteries: PA peak pressure: 64 mm Hg (S).  Impressions:  - The right ventricular systolic pressure was increased consistent with moderate pulmonary hypertension.  Cardiac Catheterization: 01/23/2018  Prox LAD lesion is 10% stenosed.  The left ventricular ejection fraction is 50-55% by visual estimate.  LV end diastolic pressure is mildly elevated.  The left ventricular systolic function is  normal.  No significant coronary obstructive disease with essentially normal coronary arteries and only mild luminal irregularity of the proximal LAD of 10%; normal left circumflex and normal dominant RCA.  Low normal global LV function with an ejection fraction of 50 to 55% without definitive focal segmental wall motion abnormalities. LVEDP is 18 mmHg.  RECOMMENDATION: Medical therapy. Weight loss is essential. The patient should be evaluated for obstructive sleep apnea.  No indication for antiplatelet therapy at this time.  Echocardiogram: 03/01/2018 Study Conclusions  - Left ventricle: The cavity size was normal. Wall thickness was increased in a pattern of mild LVH. Systolic function was normal. The estimated ejection fraction was in the range of 55% to 60%. Wall motion was normal; there were no regional wall motion abnormalities. Left ventricular diastolic function parameters were normal. - Aortic valve: Mildly calcified annulus. Trileaflet. There was no stenosis. Mean gradient (S): 8 mm Hg. Valve area (VTI): 2.31 cm^2. - Mitral valve: Mildly calcified annulus. - Right ventricle: The cavity size was moderately dilated. Systolic function was mildly reduced. - Right atrium: Central venous pressure (est): 3 mm Hg. - Atrial septum: No defect or patent foramen ovale was identified. - Tricuspid valve: There was mild regurgitation. - Pulmonary arteries: Systolic pressure was severely increased. PA peak pressure: 87 mm Hg (S). - Pericardium, extracardiac: There was no pericardial effusion.  ASSESSMENT AND PLAN:  1.  Coronary artery calcifications: Cardiac catheterization in 2019 showed 10% proximal LAD stenosis and no obstructive disease.  He needs risk factor modification.  2.  Palpitations: Continue Toprol-XL.  Symptoms are seldom in occurrence.  3.  Hypertension: BP is borderline.  No change to therapy.  4.  Saddle pulmonary embolism: Continue  Xarelto.  5.  Pulmonary hypertension: Likely secondary to morbid obesity and moderate to severe obstructive sleep apnea.  Sleep study from 09/12/2018 reviewed.  He needs to follow-up with the sleep specialist.  I will obtain a follow-up echocardiogram.  As he may need contrast, I will order this to be performed at [facility]  6.  Obstructive sleep apnea: Moderate to severe.  He needs to follow-up with a sleep specialist.    Disposition: Follow up 1 yr with [facility]   Kate Sable, M.D., F.A.C.C.

## 2019-05-06 NOTE — Addendum Note (Signed)
Addended by: Laurine Blazer on: 05/06/2019 01:56 PM   Modules accepted: Orders

## 2019-05-09 ENCOUNTER — Other Ambulatory Visit (HOSPITAL_COMMUNITY): Payer: Medicaid Other

## 2019-05-10 ENCOUNTER — Ambulatory Visit (HOSPITAL_COMMUNITY): Payer: Medicaid Other

## 2019-05-13 ENCOUNTER — Ambulatory Visit (INDEPENDENT_AMBULATORY_CARE_PROVIDER_SITE_OTHER): Payer: Medicaid Other | Admitting: Physician Assistant

## 2019-05-13 ENCOUNTER — Other Ambulatory Visit: Payer: Self-pay

## 2019-05-13 ENCOUNTER — Encounter: Payer: Self-pay | Admitting: Physician Assistant

## 2019-05-13 VITALS — BP 157/91 | HR 76 | Temp 96.8°F | Ht 70.0 in | Wt 397.0 lb

## 2019-05-13 DIAGNOSIS — Z0000 Encounter for general adult medical examination without abnormal findings: Secondary | ICD-10-CM | POA: Diagnosis not present

## 2019-05-13 DIAGNOSIS — F419 Anxiety disorder, unspecified: Secondary | ICD-10-CM | POA: Diagnosis not present

## 2019-05-13 DIAGNOSIS — G894 Chronic pain syndrome: Secondary | ICD-10-CM

## 2019-05-13 DIAGNOSIS — M75122 Complete rotator cuff tear or rupture of left shoulder, not specified as traumatic: Secondary | ICD-10-CM | POA: Diagnosis not present

## 2019-05-13 DIAGNOSIS — M170 Bilateral primary osteoarthritis of knee: Secondary | ICD-10-CM | POA: Diagnosis not present

## 2019-05-13 MED ORDER — HYDROXYZINE HCL 25 MG PO TABS: 25.0000 mg | ORAL_TABLET | Freq: Three times a day (TID) | ORAL | 2 refills | Status: DC | PRN

## 2019-05-13 MED ORDER — OXYCODONE-ACETAMINOPHEN 10-325 MG PO TABS: 1.0000 | ORAL_TABLET | Freq: Three times a day (TID) | ORAL | 0 refills | Status: DC | PRN

## 2019-05-13 NOTE — Progress Notes (Signed)
Acute Office Visit  Subjective:    Patient ID: Bradley Torres, male    DOB: [birth year], 59 y.o.   MRN: [id number]  Chief Complaint  Patient presents with  . Shortness of Breath  . Depression  . Hypertension  . Osteoarthritis  . Back Pain    Hypertension This is a chronic problem. The current episode started more than 1 year ago. The problem is controlled. Associated symptoms include anxiety. Pertinent negatives include no chest pain, headaches, palpitations or shortness of breath. Past treatments include ACE inhibitors, diuretics and beta blockers. The current treatment provides moderate improvement.  Back Pain This is a chronic problem. The current episode started more than 1 year ago. The problem has been waxing and waning since onset. The pain is present in the lumbar spine. Pertinent negatives include no abdominal pain, chest pain, headaches, numbness or weakness.  Anxiety Presents for follow-up visit. Symptoms include decreased concentration, excessive worry, irritability and nervous/anxious behavior. Patient reports no chest pain, nausea, palpitations or shortness of breath. Symptoms occur most days.    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 Moderately severe osteoarthritis with a small joint effusion.  This patient returns for a 3 month recheck on narcotic use for the above named conditions  Current medications-Percocet 10/325 1 every 8 hours as needed for severe pain Flexeril as needed for muscle spasm Medication side effects-no Any concerns-no  Pain on scale of 1-10-7 Frequency-Daily What increases pain-walking What makes pain Better-rest Effects on ADL -mild Any change in general medical  condition-no  Effectiveness of current meds-good Adverse reactions form pain meds-no PMP AWARE website reviewed:Yes Any suspicious activity on PMP Aware:No  Contract on file 01/16/19 Last UDS  05/12/18  45 MME  Last UDS2/11/20   Past Medical History:  Diagnosis Date  . CAD (coronary artery disease)    a. 12/2017: cath showing 10% Proximal-LAD stenosis with no significant obstructive disease and LVEDP mildly elevated at 18 mm Hg.   Marland Kitchen Chronic back pain   . DVT (deep venous thrombosis) ([place])   . Essential hypertension   . Morbid obesity ([place])   . Panic disorder   . Prediabetes   . Shoulder pain, left    Following fall    Past Surgical History:  Procedure Laterality Date  . Ankle fracture sugery Left   . COLONOSCOPY WITH PROPOFOL N/A 05/29/2017   Dr. [name]: Multiple tubular adenomas removed, prep inadequate.  Short interval surveillance colonoscopy recommended in 6 months  . LEFT HEART CATH AND CORONARY ANGIOGRAPHY N/A 01/23/2018   Procedure: LEFT HEART CATH AND CORONARY ANGIOGRAPHY;  Surgeon: Troy Sine, MD;  Location: [facility];  Service: Cardiovascular;  Laterality: N/A;  . LUMBAR SPINE SURGERY     fusion  . POLYPECTOMY  05/29/2017   Procedure: POLYPECTOMY;  Surgeon: Daneil Dolin, MD;  [facility];  Service: Endoscopy;;  ascending colon polyp x5-cs transverse colon polyp x4- cs descending colon polyp x1- hs sigmoid colon polyp x1 -hs rectal polyp x1- hs  . TONSILLECTOMY      Family History  Problem Relation Age of Onset  . Colon cancer Mother        died at age 66  . Heart attack Father   . Hepatitis Sister 24  Autoimmune  . Heart attack Paternal Grandfather     Social History   Socioeconomic History  . Marital status: Single    Spouse name: Not on file  . Number of children: Not on file  . Years of education: Not on file  . Highest education level: Not on file  Occupational History  . Not on file  Tobacco Use  .  Smoking status: Never Smoker  . Smokeless tobacco: Never Used  Substance and Sexual Activity  . Alcohol use: Yes    Comment: occ  . Drug use: Not Currently    Types: Marijuana    Comment: daily; 04/19/19 none past few weeks d/t problems breathing  . Sexual activity: Not Currently    Birth control/protection: None  Other Topics Concern  . Not on file  Social History Narrative  . Not on file   Social Determinants of Health   Financial Resource Strain:   . Difficulty of Paying Living Expenses: Not on file  Food Insecurity:   . Worried About Charity fundraiser in the Last Year: Not on file  . Ran Out of Food in the Last Year: Not on file  Transportation Needs:   . Lack of Transportation (Medical): Not on file  . Lack of Transportation (Non-Medical): Not on file  Physical Activity:   . Days of Exercise per Week: Not on file  . Minutes of Exercise per Session: Not on file  Stress: Stress Concern Present  . Feeling of Stress : Rather much  Social Connections:   . Frequency of Communication with Friends and Family: Not on file  . Frequency of Social Gatherings with Friends and Family: Not on file  . Attends Religious Services: Not on file  . Active Member of Clubs or Organizations: Not on file  . Attends Archivist Meetings: Not on file  . Marital Status: Not on file  Intimate Partner Violence:   . Fear of Current or Ex-Partner: Not on file  . Emotionally Abused: Not on file  . Physically Abused: Not on file  . Sexually Abused: Not on file    Outpatient Medications Prior to Visit  Medication Sig Dispense Refill  . albuterol (VENTOLIN HFA) 108 (90 Base) MCG/ACT inhaler Inhale 2 puffs into the lungs every 6 (six) hours as needed for wheezing or shortness of breath. 18 g 0  . cyclobenzaprine (FLEXERIL) 10 MG tablet TAKE 1 TABLET BY MOUTH THREE TIMES DAILY AS NEEDED 90 tablet 0  . DULoxetine (CYMBALTA) 30 MG capsule Take 1 capsule (30 mg total) by mouth daily. 30 capsule  1  . hydrochlorothiazide (HYDRODIURIL) 25 MG tablet Take 2 tablets (50 mg total) by mouth daily. (Patient taking differently: Take 25 mg by mouth daily. ) 180 tablet 3  . metoprolol succinate (TOPROL-XL) 100 MG 24 hr tablet Take 1 tablet (100 mg total) by mouth daily. Take with or immediately following a meal. 30 tablet 3  . rivaroxaban (XARELTO) 20 MG TABS tablet TAKE 1 TABLET BY MOUTH DAILY WITH SUPPER 30 tablet 11  . SYMBICORT 80-4.5 MCG/ACT inhaler Inhale 2 puffs into the lungs 2 (two) times daily. 1 Inhaler 2  . terbinafine (LAMISIL) 1 % cream Apply 1 application topically 2 (two) times daily as needed (FOR ITCHY SKIN.). 30 g 5  . oxyCODONE-acetaminophen (PERCOCET) 10-325 MG tablet Take 1 tablet by mouth every 8 (eight) hours as needed for pain. 90 tablet 0  . hydrOXYzine (ATARAX/VISTARIL) 25 MG tablet Take 1 tablet (25 mg total)  by mouth 3 (three) times daily as needed. (Patient not taking: Reported on 05/13/2019) 30 tablet 0   No facility-administered medications prior to visit.    Allergies  Allergen Reactions  . Ibuprofen Other (See Comments)    Rectal bleed  . Lisinopril     wheezing  . Diflucan [Fluconazole] Rash  . Losartan Rash    Review of Systems  Constitutional: Positive for irritability. Negative for appetite change and fatigue.  HENT: Negative.   Eyes: Negative.  Negative for pain and visual disturbance.  Respiratory: Negative.  Negative for cough, chest tightness, shortness of breath and wheezing.   Cardiovascular: Negative.  Negative for chest pain, palpitations and leg swelling.  Gastrointestinal: Negative.  Negative for abdominal pain, diarrhea, nausea and vomiting.  Endocrine: Negative.   Genitourinary: Negative.   Musculoskeletal: Positive for back pain.  Skin: Negative.  Negative for color change and rash.  Neurological: Negative.  Negative for weakness, numbness and headaches.  Psychiatric/Behavioral: Positive for decreased concentration. The patient is  nervous/anxious.        Objective:    Physical Exam Vitals and nursing note reviewed.  Constitutional:      General: He is not in acute distress.    Appearance: He is well-developed. He is obese.  HENT:     Head: Normocephalic and atraumatic.  Eyes:     Conjunctiva/sclera: Conjunctivae normal.     Pupils: Pupils are equal, round, and reactive to light.  Cardiovascular:     Rate and Rhythm: Normal rate and regular rhythm.     Heart sounds: Normal heart sounds.  Pulmonary:     Effort: Pulmonary effort is normal. No respiratory distress.     Breath sounds: Normal breath sounds.  Skin:    General: Skin is warm and dry.  Psychiatric:        Behavior: Behavior normal.     BP (!) 157/91   Pulse 76   Temp (!) 96.8 F (36 C) (Temporal)   Ht 5\' 10"  (1.778 m)   Wt (!) 397 lb (180.1 kg)   SpO2 94%   BMI 56.96 kg/m  Wt Readings from Last 3 Encounters:  05/13/19 (!) 397 lb (180.1 kg)  05/06/19 (!) 401 lb (181.9 kg)  04/19/19 (!) 394 lb (178.7 kg)    Health Maintenance Due  Topic Date Due  . Hepatitis C Screening  22-Jun-1960    There are no preventive care reminders to display for this patient.   Lab Results  Component Value Date   TSH 2.840 05/13/2019   Lab Results  Component Value Date   WBC 7.3 04/10/2019   HGB 14.4 04/10/2019   HCT 42.2 04/10/2019   MCV 97.5 04/10/2019   PLT 281 04/10/2019   Lab Results  Component Value Date   NA 138 04/10/2019   K 3.4 (L) 04/10/2019   CO2 30 04/10/2019   GLUCOSE 141 (H) 04/10/2019   BUN 15 04/10/2019   CREATININE 1.22 04/10/2019   BILITOT 0.7 04/10/2019   ALKPHOS 58 04/10/2019   AST 23 04/10/2019   ALT 26 04/10/2019   PROT 7.3 04/10/2019   ALBUMIN 3.7 04/10/2019   CALCIUM 9.0 04/10/2019   ANIONGAP 10 04/10/2019   Lab Results  Component Value Date   CHOL 183 01/07/2019   Lab Results  Component Value Date   HDL 39 (L) 01/07/2019   Lab Results  Component Value Date   LDLCALC 123 (H) 01/07/2019   Lab  Results  Component Value Date   TRIG  114 01/07/2019   Lab Results  Component Value Date   CHOLHDL 4.7 01/07/2019   Lab Results  Component Value Date   HGBA1C 6.2 (H) 01/20/2018       Assessment & Plan:   Problem List Items Addressed This Visit      Other   Chronic pain syndrome - Primary   Relevant Medications   oxyCODONE-acetaminophen (PERCOCET) 10-325 MG tablet    Other Visit Diagnoses    Anxiety       Relevant Medications   hydrOXYzine (ATARAX/VISTARIL) 25 MG tablet   Other Relevant Orders   Ambulatory referral to Psychiatry   Well adult exam       Relevant Orders   TSH (Completed)   PSA (Completed)   Primary osteoarthritis of both knees       Relevant Medications   oxyCODONE-acetaminophen (PERCOCET) 10-325 MG tablet   Nontraumatic complete tear of left rotator cuff       Relevant Medications   oxyCODONE-acetaminophen (PERCOCET) 10-325 MG tablet       Meds ordered this encounter  Medications  . oxyCODONE-acetaminophen (PERCOCET) 10-325 MG tablet    Sig: Take 1 tablet by mouth every 8 (eight) hours as needed for pain.    Dispense:  90 tablet    Refill:  0    Order Specific Question:   Supervising Provider    Answer:   Janora Norlander [id number]  . hydrOXYzine (ATARAX/VISTARIL) 25 MG tablet    Sig: Take 1 tablet (25 mg total) by mouth 3 (three) times daily as needed for anxiety.    Dispense:  60 tablet    Refill:  2    Order Specific Question:   Supervising Provider    Answer:   Janora Norlander [id number]     Terald Sleeper, PA-C   Terald Sleeper PA-C [facility] [address]  [place],  [address] [phone]

## 2019-05-14 LAB — PSA: Prostate Specific Ag, Serum: 0.3 ng/mL (ref 0.0–4.0)

## 2019-05-14 LAB — TSH: TSH: 2.84 u[IU]/mL (ref 0.450–4.500)

## 2019-05-15 ENCOUNTER — Ambulatory Visit: Payer: Self-pay | Admitting: Licensed Clinical Social Worker

## 2019-05-15 DIAGNOSIS — N1831 Chronic kidney disease, stage 3a: Secondary | ICD-10-CM

## 2019-05-15 DIAGNOSIS — I509 Heart failure, unspecified: Secondary | ICD-10-CM

## 2019-05-15 DIAGNOSIS — I10 Essential (primary) hypertension: Secondary | ICD-10-CM

## 2019-05-15 LAB — TOXASSURE SELECT 13 (MW), URINE

## 2019-05-15 NOTE — Patient Instructions (Addendum)
Licensed Clinical Social Worker Visit Information  Goals we discussed today:  Goals Addressed            This Visit's Progress   . Client will talk with LCSW in next 30 days to discuss anxiety and stress related to health needs (pt-stated)       Current Barriers:  Marland Kitchen Mental health challenges if patient with Chronic Diagnoses of HTN, CKD, CHF . Stress/Anxiety over health conditions . Social isolation issues  Clinical Social Work Clinical Goal(s):  Marland Kitchen LCSW will talk with client in next 30 days to discuss client stress/anxiety regarding health conditions    Interventions: . Talked with [name] about CCM program . Talked with Alfonzia about RNCM support with [facility] . Talked with Riddick about sleeping issues of client . Talked with client about social support network (his brother is support) . Talked with client about [place] in [place], [place] . Talked with client about relaxation techniques (watches TV)  Patient Self Care Activities:  . Takes medications as prescribed  Patient Self Care Deficits Social isolation issues  Initial goal documentation         Materials Provided: No  Follow Up Plan: LCSW to call client in next 4 weeks to talk with him about stress/anxiety issues he faces regarding health status  The patient verbalized understanding of instructions provided today and declined a print copy of patient instruction materials.   [name].Lillar Bianca MSW, LCSW Licensed Clinical Social Worker [facility] Care [facility] [phone]

## 2019-05-15 NOTE — Chronic Care Management (AMB) (Signed)
  Care Management Note   Bradley Torres is a 59 y.o. year old male who is a primary care patient of Terald Sleeper, PA-C. The CM team was consulted for assistance with chronic disease management and care coordination.   I reached out to [name] by phone today.   Review of patient status, including review of consultants reports, relevant laboratory and other test results, and collaboration with appropriate care team members and the patient's provider was performed as part of comprehensive patient evaluation and provision of chronic care management services.   Social determinants of health: risk of depression; risk of social isolation    Chronic Care Management from 05/15/2019 in [facility]  PHQ-9 Total Score  6     GAD 7 : Generalized Anxiety Score 05/15/2019 05/13/2019  Nervous, Anxious, on Edge 1 3  Control/stop worrying 1 3  Worry too much - different things 1 3  Trouble relaxing 1 3  Restless 0 2  Easily annoyed or irritable 0 3  Afraid - awful might happen 1 3  Total GAD 7 Score 5 20  Anxiety Difficulty Somewhat difficult Somewhat difficult   Medications   albuterol (VENTOLIN HFA) 108 (90 Base) MCG/ACT inhaler cyclobenzaprine (FLEXERIL) 10 MG tablet DULoxetine (CYMBALTA) 30 MG capsule hydrochlorothiazide (HYDRODIURIL) 25 MG tablet hydrOXYzine (ATARAX/VISTARIL) 25 MG tablet metoprolol succinate (TOPROL-XL) 100 MG 24 hr tablet oxyCODONE-acetaminophen (PERCOCET) 10-325 MG tablet rivaroxaban (XARELTO) 20 MG TABS tablet SYMBICORT 80-4.5 MCG/ACT inhaler terbinafine (LAMISIL) 1 % cream Goals Addressed            This Visit's Progress   . Client will talk with LCSW in next 30 days to discuss anxiety and stress related to health needs (pt-stated)       Current Barriers:  Marland Kitchen Mental health challenges if patient with Chronic Diagnoses of HTN, CKD, CHF . Stress/Anxiety over health conditions . Social isolation issues  Clinical Social Work Clinical  Goal(s):  Marland Kitchen LCSW will talk with client in next 30 days to discuss client stress/anxiety regarding health conditions   Interventions: . Talked with [name] about CCM program . Talked with Raylan about RNCM support with [facility] . Talked with Ming about sleeping issues of client . Talked with client about social support network (his brother is support) . Talked with client about [place] in [place], [place] . Talked with client about relaxation techniques (watches TV)  Patient Self Care Activities:  . Takes medications as prescribed  Patient Self Care Deficits Social isolation issues  Initial goal documentation       Follow Up Plan: LCSW to call client in next 4 weeks to talk with him about stress/anxiety issues he faces regarding health status  [name].Brylee Mcgreal MSW, LCSW Licensed Clinical Social Worker [facility] [phone]

## 2019-05-16 ENCOUNTER — Ambulatory Visit: Payer: Medicaid Other | Admitting: *Deleted

## 2019-05-16 ENCOUNTER — Ambulatory Visit (HOSPITAL_COMMUNITY): Payer: Medicaid Other

## 2019-05-16 DIAGNOSIS — I10 Essential (primary) hypertension: Secondary | ICD-10-CM

## 2019-05-16 DIAGNOSIS — I5032 Chronic diastolic (congestive) heart failure: Secondary | ICD-10-CM

## 2019-05-16 NOTE — Chronic Care Management (AMB) (Signed)
   Care Management   Care Coordination Note  05/16/2019 Name: Bradley Torres MRN: [id number] DOB: 14-Mar-1961  Patient was referred to embedded CCM team for assistance with care coordination and chronic care management. He has Medicaid as his primary insurance coverage. His chronic medical conditions include hypertension, CHF, chronic pain, chronic fatigue, depression, anxiety, CKD, and history of MI.   Chart reviewed, including recent office notes, labs, and referral as well as LCSW's note from yesterday.    Follow up plan: Patient will follow-up with LCSW regarding psychosocial issues as planned RN will reach out to patient by telephone over the next 14 days.   Chong Sicilian, BSN, RN-BC Embedded Chronic Care Manager [facility] Management Direct Dial: [phone]

## 2019-05-21 ENCOUNTER — Ambulatory Visit (HOSPITAL_COMMUNITY)
Admission: RE | Admit: 2019-05-21 | Discharge: 2019-05-21 | Disposition: A | Discharge status: Home / Self Care | Payer: Medicaid Other | Source: Ambulatory Visit | Attending: Cardiovascular Disease | Admitting: Cardiovascular Disease

## 2019-05-21 ENCOUNTER — Encounter: Payer: Self-pay | Admitting: *Deleted

## 2019-05-21 ENCOUNTER — Other Ambulatory Visit: Payer: Self-pay

## 2019-05-21 ENCOUNTER — Other Ambulatory Visit (HOSPITAL_COMMUNITY): Payer: Medicaid Other

## 2019-05-21 DIAGNOSIS — I272 Pulmonary hypertension, unspecified: Secondary | ICD-10-CM

## 2019-05-21 NOTE — Progress Notes (Signed)
*  PRELIMINARY RESULTS* Echocardiogram 2D Echocardiogram has been performed.  Leavy Cella 05/21/2019, 1:54 PM

## 2019-05-22 ENCOUNTER — Other Ambulatory Visit: Payer: Self-pay | Admitting: Physician Assistant

## 2019-05-22 DIAGNOSIS — M67912 Unspecified disorder of synovium and tendon, left shoulder: Secondary | ICD-10-CM

## 2019-05-22 DIAGNOSIS — G894 Chronic pain syndrome: Secondary | ICD-10-CM

## 2019-05-22 DIAGNOSIS — S4991XA Unspecified injury of right shoulder and upper arm, initial encounter: Secondary | ICD-10-CM

## 2019-05-30 ENCOUNTER — Ambulatory Visit: Payer: Medicaid Other | Admitting: *Deleted

## 2019-05-30 DIAGNOSIS — N183 Chronic kidney disease, stage 3 unspecified: Secondary | ICD-10-CM

## 2019-05-30 DIAGNOSIS — I5032 Chronic diastolic (congestive) heart failure: Secondary | ICD-10-CM

## 2019-05-30 DIAGNOSIS — I10 Essential (primary) hypertension: Secondary | ICD-10-CM

## 2019-05-30 NOTE — Chronic Care Management (AMB) (Signed)
  Chronic Care Management   Outreach Note  05/30/2019 Name: Bradley Torres MRN: [id number] DOB: 1960-11-05  Referred by: Terald Sleeper, PA-C Reason for referral : Chronic Care Management (RN Initial visit)   An unsuccessful initial telephone outreach was attempted today. The patient was referred to the case management team for assistance with care management and care coordination. He has been contacted by our LCSW and has plans to follow-up with him over the next month. Bradley Torres has also been referred to a psychiatrist and pain management specialist.   Follow Up Plan: A HIPPA compliant phone message was left for the patient providing contact information and requesting a return call.  The care management team will reach out to the patient again over the next 30 days.   Chong Sicilian, BSN, RN-BC Embedded Chronic Care Manager [facility] Direct Dial: [phone]

## 2019-06-04 ENCOUNTER — Encounter: Payer: Self-pay | Admitting: Family

## 2019-06-04 ENCOUNTER — Ambulatory Visit (INDEPENDENT_AMBULATORY_CARE_PROVIDER_SITE_OTHER): Payer: Medicaid Other | Admitting: Family

## 2019-06-04 DIAGNOSIS — M25521 Pain in right elbow: Secondary | ICD-10-CM

## 2019-06-04 MED ORDER — PREDNISONE 10 MG (21) PO TBPK: ORAL_TABLET | ORAL | 0 refills | Status: DC

## 2019-06-04 NOTE — Progress Notes (Signed)
   Virtual Visit via telephone Note Due to COVID-19 pandemic this visit was conducted virtually. This visit type was conducted due to national recommendations for restrictions regarding the COVID-19 Pandemic (e.g. social distancing, sheltering in place) in an effort to limit this patient's exposure and mitigate transmission in our community. All issues noted in this document were discussed and addressed.  A physical exam was not performed with this format.  I connected with Bradley Torres on 06/04/19 at 10:53 AM  by telephone and verified that I am speaking with the correct person using two identifiers. Bradley Torres is currently located at home and no one is currently with him during visit. The provider, Evelina Dun, [name] is located in their office at time of visit.  I discussed the limitations, risks, security and privacy concerns of performing an evaluation and management service by telephone and the availability of in person appointments. I also discussed with the patient that there may be a patient responsible charge related to this service. The patient expressed understanding and agreed to proceed.   History and Present Illness:  HPI  Pt calls the office today with right elbow pain that started two days ago that is worsening. He reports aching pain of 10 out 10. He states moving or hitting it makes it worse. He denies any injury to the area. He denies any redness, swelling, itching,  but has heat. Denies any insect bite.   He takes oxycodone regularly and nothing else.   Review of Systems  Musculoskeletal: Positive for joint pain (elbow pain).  All other systems reviewed and are negative.    Observations/Objective: No SOB or distress noted   Assessment and Plan: 1. Right elbow pain Can not tolerate NSAID's because Hx of GI bleed Bursitis vs Gout? Will give prednisone today. If area worsens or does improve in 1-2 days call our office. Worrisome for cellulitis if does not improve  and will send in antibiotic  Rest Ice Elevate   - predniSONE (STERAPRED UNI-PAK 21 TAB) 10 MG (21) TBPK tablet; Use as directed  Dispense: 21 tablet; Refill: 0     I discussed the assessment and treatment plan with the patient. The patient was provided an opportunity to ask questions and all were answered. The patient agreed with the plan and demonstrated an understanding of the instructions.   The patient was advised to call back or seek an in-person evaluation if the symptoms worsen or if the condition fails to improve as anticipated.  The above assessment and management plan was discussed with the patient. The patient verbalized understanding of and has agreed to the management plan. Patient is aware to call the clinic if symptoms persist or worsen. Patient is aware when to return to the clinic for a follow-up visit. Patient educated on when it is appropriate to go to the emergency department.   Time call ended:  11:09 pm  I provided 16 minutes of non-face-to-face time during this encounter.    Evelina Dun, [name]

## 2019-06-11 ENCOUNTER — Ambulatory Visit: Payer: Self-pay | Admitting: Licensed Clinical Social Worker

## 2019-06-11 DIAGNOSIS — N183 Chronic kidney disease, stage 3 unspecified: Secondary | ICD-10-CM

## 2019-06-11 DIAGNOSIS — I10 Essential (primary) hypertension: Secondary | ICD-10-CM

## 2019-06-11 DIAGNOSIS — I509 Heart failure, unspecified: Secondary | ICD-10-CM

## 2019-06-11 NOTE — Patient Instructions (Addendum)
Licensed Clinical Social Worker Visit Information  Goals we discussed today:    Goals    . Client will talk with LCSW in next 30 days to discuss anxiety and stress related to health needs (pt-stated)     Current Barriers:  Marland Kitchen Mental health challenges if patient with Chronic Diagnoses of HTN, CKD, CHF . Stress/Anxiety over health conditions . Social isolation issues  Clinical Social Work Clinical Goal(s):  Marland Kitchen LCSW will talk with client in next 30 days to discuss client stress/anxiety regarding health conditions   Interventions:  Talked with [name] about CCM program  Talked with Blace about RNCM support with [facility]  Talked with Kailor about sleeping issues of client  Talked with client about social support network (his brother is support)  Talked with client previously about [place] in [place], [place]  Talked with client about relaxation techniques (watches TV,enjoys being outdoors on pretty   weather days)  Talked with client about pain issues of client  Talked with client about referral [name] had made for client to Pain Clinic in [place]  Patient Self Care Activities:  . Takes medications as prescribed  Patient Self Care Deficits Social isolation issues  Initial goal documentation       Materials Provided: No  Follow Up Plan:LCSW to call client in next 4 weeks to talk with him about stress/anxiety issues he faces regarding health status   The patient verbalized understanding of instructions provided today and declined a print copy of patient instruction materials.   [name].Lashan Gluth MSW, LCSW Licensed Clinical Social Worker [facility] [phone]

## 2019-06-11 NOTE — Chronic Care Management (AMB) (Signed)
  Care Management Note   Bradley Torres is a 59 y.o. year old male who is a primary care patient of Terald Sleeper, PA-C. The CM team was consulted for assistance with chronic disease management and care coordination.   I reached out to [name] by phone today.    Review of patient status, including review of consultants reports, relevant laboratory and other test results, and collaboration with appropriate care team members and the patient's provider was performed as part of comprehensive patient evaluation and provision of chronic care management services.   Social determinants of health:risk of tobacco exposure; risk of depression; risk of stress    Chronic Care Management from 05/15/2019 in [place]  PHQ-9 Total Score  6     GAD 7 : Generalized Anxiety Score 05/15/2019 05/13/2019  Nervous, Anxious, on Edge 1 3  Control/stop worrying 1 3  Worry too much - different things 1 3  Trouble relaxing 1 3  Restless 0 2  Easily annoyed or irritable 0 3  Afraid - awful might happen 1 3  Total GAD 7 Score 5 20  Anxiety Difficulty Somewhat difficult Somewhat difficult   Medications   hydrochlorothiazide (HYDRODIURIL) 25 MG tablet metoprolol succinate (TOPROL-XL) 100 MG 24 hr tablet oxyCODONE-acetaminophen (PERCOCET) 10-325 MG tablet predniSONE (STERAPRED UNI-PAK 21 TAB) 10 MG (21) TBPK tablet rivaroxaban (XARELTO) 20 MG TABS tablet  Goals    . Client will talk with LCSW in next 30 days to discuss anxiety and stress related to health needs (pt-stated)     Current Barriers:  Marland Kitchen Mental health challenges if patient with Chronic Diagnoses of HTN, CKD, CHF . Stress/Anxiety over health conditions . Social isolation issues  Clinical Social Work Clinical Goal(s):  Marland Kitchen LCSW will talk with client in next 30 days to discuss client stress/anxiety regarding health conditions   Interventions:  Talked with [name] about CCM program  Talked with Jodie about RNCM  support with [place]  Talked with Perman about sleeping issues of client  Talked with client about social support network (his brother is support)  Talked with client previously about [facility] in [place], [place]  Talked with client about relaxation techniques (watches TV,enjoys being outdoors on pretty   weather days)  Talked with client about pain issues of client  Talked with client about referral [name] had made for client to Pain Clinic in [place]  Patient Self Care Activities:  . Takes medications as prescribed  Patient Self Care Deficits Social isolation issues  Initial goal documentation       Follow Up Plan: LCSW to call client in next 4 weeks to talk with him about stress/anxiety issues he faces regarding health status  [name].Kymia Simi MSW, LCSW Licensed Clinical Social Worker [facility] [phone]

## 2019-06-20 ENCOUNTER — Ambulatory Visit: Payer: Medicaid Other | Admitting: Physician Assistant

## 2019-06-21 ENCOUNTER — Other Ambulatory Visit: Payer: Self-pay

## 2019-06-21 ENCOUNTER — Ambulatory Visit (INDEPENDENT_AMBULATORY_CARE_PROVIDER_SITE_OTHER): Payer: Medicaid Other | Admitting: Family

## 2019-06-21 ENCOUNTER — Encounter: Payer: Self-pay | Admitting: Family

## 2019-06-21 VITALS — BP 166/98 | HR 68 | Temp 96.6°F | Ht 70.0 in | Wt >= 6400 oz

## 2019-06-21 DIAGNOSIS — Z7901 Long term (current) use of anticoagulants: Secondary | ICD-10-CM | POA: Diagnosis not present

## 2019-06-21 DIAGNOSIS — M150 Primary generalized (osteo)arthritis: Secondary | ICD-10-CM

## 2019-06-21 DIAGNOSIS — Z86711 Personal history of pulmonary embolism: Secondary | ICD-10-CM

## 2019-06-21 DIAGNOSIS — M8949 Other hypertrophic osteoarthropathy, multiple sites: Secondary | ICD-10-CM

## 2019-06-21 DIAGNOSIS — G8929 Other chronic pain: Secondary | ICD-10-CM | POA: Diagnosis not present

## 2019-06-21 DIAGNOSIS — M549 Dorsalgia, unspecified: Secondary | ICD-10-CM | POA: Diagnosis not present

## 2019-06-21 DIAGNOSIS — I5032 Chronic diastolic (congestive) heart failure: Secondary | ICD-10-CM | POA: Diagnosis not present

## 2019-06-21 DIAGNOSIS — Z6841 Body Mass Index (BMI) 40.0 and over, adult: Secondary | ICD-10-CM | POA: Diagnosis not present

## 2019-06-21 DIAGNOSIS — N183 Chronic kidney disease, stage 3 unspecified: Secondary | ICD-10-CM

## 2019-06-21 DIAGNOSIS — I509 Heart failure, unspecified: Secondary | ICD-10-CM

## 2019-06-21 DIAGNOSIS — M159 Polyosteoarthritis, unspecified: Secondary | ICD-10-CM

## 2019-06-21 DIAGNOSIS — I10 Essential (primary) hypertension: Secondary | ICD-10-CM

## 2019-06-21 DIAGNOSIS — I252 Old myocardial infarction: Secondary | ICD-10-CM | POA: Diagnosis not present

## 2019-06-21 MED ORDER — GABAPENTIN 300 MG PO CAPS: 300.0000 mg | ORAL_CAPSULE | Freq: Three times a day (TID) | ORAL | 3 refills | Status: DC

## 2019-06-21 MED ORDER — METOPROLOL SUCCINATE ER 100 MG PO TB24: 100.0000 mg | ORAL_TABLET | Freq: Every day | ORAL | 3 refills | Status: DC

## 2019-06-21 MED ORDER — HYDROCHLOROTHIAZIDE 25 MG PO TABS: 25.0000 mg | ORAL_TABLET | Freq: Every day | ORAL | 4 refills | Status: DC

## 2019-06-21 MED ORDER — AMLODIPINE BESYLATE 5 MG PO TABS: 5.0000 mg | ORAL_TABLET | Freq: Every day | ORAL | 3 refills | Status: DC

## 2019-06-21 MED ORDER — RIVAROXABAN 20 MG PO TABS: ORAL_TABLET | ORAL | 11 refills | Status: DC

## 2019-06-21 NOTE — Progress Notes (Signed)
Subjective:    Patient ID: Bradley Torres, male    DOB: 1960/07/19, 59 y.o.   MRN: [id number]  Chief Complaint  Patient presents with  . Medical Management of Chronic Issues    wants refill of pain med. Bradley Torres took him off due to failed drug screen he has been referred to pain manegment trying to get scheduled worked out   PT presents to the office today for chronic follow up. He is followed by Cardiologists annually for hx of NSTEM in 2020. Followed by GI for polyps. His last colonoscopy was  05/30/27 and was suppose to repeat in 6 months. This was delayed because of COVID and his NSTEMI.   He is followed by Neurologists for OSA. He needs to repeat his sleep study to have his colonoscopy.  Hypertension This is a chronic problem. The current episode started more than 1 year ago. The problem has been waxing and waning since onset. The problem is uncontrolled. Associated symptoms include malaise/fatigue and shortness of breath. Pertinent negatives include no headaches or peripheral edema. Risk factors for coronary artery disease include diabetes mellitus, obesity, sedentary lifestyle and male gender. Past treatments include beta blockers. The current treatment provides mild improvement. Hypertensive end-organ damage includes kidney disease and CAD/MI. There is no history of CVA.  Back Pain This is a chronic problem. The current episode started more than 1 year ago. The problem occurs intermittently. The problem has been waxing and waning since onset. The pain is present in the lumbar spine. The pain is at a severity of 8/10. The pain is moderate. Associated symptoms include leg pain and weakness. Pertinent negatives include no bladder incontinence, bowel incontinence or headaches. Risk factors include obesity. He has tried analgesics and bed rest for the symptoms. The treatment provided mild relief.  Arthritis Presents for follow-up visit. He complains of pain and joint swelling. The symptoms have  been worsening. Affected locations include the left ankle, right ankle, left knee and right knee. His pain is at a severity of 8/10.   HX PE Pt takes xarelto daily.    Review of Systems  Constitutional: Positive for malaise/fatigue.  Respiratory: Positive for shortness of breath.   Gastrointestinal: Negative for bowel incontinence.  Genitourinary: Negative for bladder incontinence.  Musculoskeletal: Positive for arthritis, back pain and joint swelling.  Neurological: Positive for weakness. Negative for headaches.  All other systems reviewed and are negative.      Objective:   Physical Exam Vitals reviewed.  Constitutional:      General: He is not in acute distress.    Appearance: He is well-developed. He is obese.  HENT:     Head: Normocephalic.     Right Ear: Tympanic membrane normal.     Left Ear: Tympanic membrane normal.  Eyes:     General:        Right eye: No discharge.        Left eye: No discharge.     Pupils: Pupils are equal, round, and reactive to light.  Neck:     Thyroid: No thyromegaly.  Cardiovascular:     Rate and Rhythm: Normal rate and regular rhythm.     Heart sounds: Normal heart sounds. No murmur.  Pulmonary:     Effort: Pulmonary effort is normal. No respiratory distress.     Breath sounds: Normal breath sounds. No wheezing.  Abdominal:     General: Bowel sounds are normal. There is no distension.     Palpations: Abdomen is soft.  Tenderness: There is no abdominal tenderness.  Musculoskeletal:        General: No tenderness.     Cervical back: Normal range of motion and neck supple.     Comments: Pain in lumbar with flexion and extension   Skin:    General: Skin is warm and dry.     Findings: No erythema or rash.  Neurological:     Mental Status: He is alert and oriented to person, place, and time.     Cranial Nerves: No cranial nerve deficit.     Deep Tendon Reflexes: Reflexes are normal and symmetric.  Psychiatric:        Behavior:  Behavior normal.        Thought Content: Thought content normal.        Judgment: Judgment normal.      BP (!) 166/98   Pulse 68   Temp (!) 96.6 F (35.9 C) (Temporal)   Ht 5\' 10"  (1.778 m)   Wt (!) 401 lb 9.6 oz (182.2 kg)   SpO2 96%   BMI 57.62 kg/m       Assessment & Plan:  Bradley Torres comes in today with chief complaint of Medical Management of Chronic Issues (wants refill of pain med. Bradley Torres took him off due to failed drug screen he has been referred to pain manegment trying to get scheduled worked out)   Diagnosis and orders addressed:  1. Benign essential HTN Will add Norvasc 5 mg  -Daily blood pressure log given with instructions on how to fill out and told to bring to next visit -Dash diet information given -Exercise encouraged - Stress Management  -Continue current meds - amLODipine (NORVASC) 5 MG tablet; Take 1 tablet (5 mg total) by mouth daily.  Dispense: 90 tablet; Refill: 3 - hydrochlorothiazide (HYDRODIURIL) 25 MG tablet; Take 1 tablet (25 mg total) by mouth daily.  Dispense: 90 tablet; Refill: 4 - metoprolol succinate (TOPROL-XL) 100 MG 24 hr tablet; Take 1 tablet (100 mg total) by mouth daily. Take with or immediately following a meal.  Dispense: 30 tablet; Refill: 3  2. Chronic diastolic CHF (congestive heart failure) (HCC)  3. Stage 3 chronic kidney disease, unspecified whether stage 3a or 3b CKD  4. Hx of non-ST elevation myocardial infarction (NSTEMI)  5. Hx pulmonary embolism - rivaroxaban (XARELTO) 20 MG TABS tablet; TAKE 1 TABLET BY MOUTH DAILY WITH SUPPER  Dispense: 30 tablet; Refill: 11  6. Anticoagulated by anticoagulation treatment  7. Morbid obesity with BMI of 50.0-59.9, adult ([place])  8. Chronic bilateral back pain, unspecified back location Will start gabapentin 30 mg TID Long discussion I could not start controlled medications because of failed drug screens - gabapentin (NEURONTIN) 300 MG capsule; Take 1 capsule (300 mg total) by  mouth 3 (three) times daily.  Dispense: 90 capsule; Refill: 3  9. Primary osteoarthritis involving multiple joints  10. Congestive heart failure, unspecified HF chronicity, unspecified heart failure type (HCC) - hydrochlorothiazide (HYDRODIURIL) 25 MG tablet; Take 1 tablet (25 mg total) by mouth daily.  Dispense: 90 tablet; Refill: 4 - metoprolol succinate (TOPROL-XL) 100 MG 24 hr tablet; Take 1 tablet (100 mg total) by mouth daily. Take with or immediately following a meal.  Dispense: 30 tablet; Refill: 3   Labs pending Health Maintenance reviewed Diet and exercise encouraged  Follow up plan: 6 months    Evelina Dun, [name]

## 2019-06-21 NOTE — Patient Instructions (Signed)

## 2019-06-25 ENCOUNTER — Telehealth: Payer: Self-pay | Admitting: Gastroenterology

## 2019-06-25 NOTE — Telephone Encounter (Signed)
Please check in with patient. We have been waiting to schedule his TCS once he completes his sleep apnea titration test. Can we find out if this has been done or is scheduled.

## 2019-06-25 NOTE — Telephone Encounter (Signed)
Pt said that he has an upcoming appt on 07/02/19.  He says he thinks it may take awhile before he gets scheduled for the actual sleep test.  Pt is aware to call us back once studies have been completed so we will know and can proceed with scheduling TCS.  Pt voiced understanding and agreed to touch base with [place].

## 2019-06-25 NOTE — Telephone Encounter (Signed)
Lmom for pt to call me back. 

## 2019-06-26 ENCOUNTER — Ambulatory Visit: Payer: Medicaid Other | Admitting: Neurology

## 2019-06-29 NOTE — Telephone Encounter (Signed)
Noted  

## 2019-07-02 ENCOUNTER — Ambulatory Visit: Payer: Medicaid Other | Admitting: Neurology

## 2019-07-05 ENCOUNTER — Other Ambulatory Visit: Payer: Self-pay | Admitting: *Deleted

## 2019-07-05 DIAGNOSIS — F321 Major depressive disorder, single episode, moderate: Secondary | ICD-10-CM

## 2019-07-05 DIAGNOSIS — F419 Anxiety disorder, unspecified: Secondary | ICD-10-CM

## 2019-07-05 MED ORDER — DULOXETINE HCL 30 MG PO CPEP: 30.0000 mg | ORAL_CAPSULE | Freq: Every day | ORAL | 1 refills | Status: DC

## 2019-07-05 NOTE — Telephone Encounter (Signed)
Fowarding to provider whom patient has established care with last month

## 2019-07-05 NOTE — Addendum Note (Signed)
Addended by: Evelina Dun A on: 07/05/2019 01:52 PM   Modules accepted: Orders

## 2019-07-05 NOTE — Telephone Encounter (Signed)
Prescription sent to pharmacy.

## 2019-07-05 NOTE — Telephone Encounter (Signed)
Fax from [facility] request for Duloxetine 30 mg 1 QD Not on current med list

## 2019-07-12 ENCOUNTER — Ambulatory Visit: Payer: Self-pay | Admitting: Licensed Clinical Social Worker

## 2019-07-12 DIAGNOSIS — I509 Heart failure, unspecified: Secondary | ICD-10-CM

## 2019-07-12 DIAGNOSIS — I10 Essential (primary) hypertension: Secondary | ICD-10-CM

## 2019-07-12 DIAGNOSIS — N183 Chronic kidney disease, stage 3 unspecified: Secondary | ICD-10-CM

## 2019-07-12 NOTE — Chronic Care Management (AMB) (Addendum)
  Chronic Care Management    Clinical Social Work Follow Up Note  07/12/2019 Name: Bradley Torres MRN: [id number] DOB: 1960/10/05  Bradley Torres is a 59 y.o. year old male who is a primary care patient of Bradley Balloon, [name]. The CCM team was consulted for assistance with [name] .   Review of patient status, including review of consultants reports, other relevant assessments, and collaboration with appropriate care team members and the patient's provider was performed as part of comprehensive patient evaluation and provision of chronic care management services.    SDOH (Social Determinants of Health) assessments performed: Yes; risk for stress; risk for depression; risk for tobacco use      Chronic Care Management from 05/15/2019 in [place]  PHQ-9 Total Score  6      GAD 7 : Generalized Anxiety Score 05/15/2019 05/13/2019  Nervous, Anxious, on Edge 1 3  Control/stop worrying 1 3  Worry too much - different things 1 3  Trouble relaxing 1 3  Restless 0 2  Easily annoyed or irritable 0 3  Afraid - awful might happen 1 3  Total GAD 7 Score 5 20  Anxiety Difficulty Somewhat difficult Somewhat difficult    Outpatient Encounter Medications as of 07/12/2019  Medication Sig   amLODipine (NORVASC) 5 MG tablet Take 1 tablet (5 mg total) by mouth daily.   DULoxetine (CYMBALTA) 30 MG capsule Take 1 capsule (30 mg total) by mouth daily.   gabapentin (NEURONTIN) 300 MG capsule Take 1 capsule (300 mg total) by mouth 3 (three) times daily.   hydrochlorothiazide (HYDRODIURIL) 25 MG tablet Take 1 tablet (25 mg total) by mouth daily.   metoprolol succinate (TOPROL-XL) 100 MG 24 hr tablet Take 1 tablet (100 mg total) by mouth daily. Take with or immediately following a meal.   rivaroxaban (XARELTO) 20 MG TABS tablet TAKE 1 TABLET BY MOUTH DAILY WITH SUPPER   No facility-administered encounter medications on file as of 07/12/2019.    Goals      Client will  talk with LCSW in next 30 days to discuss anxiety and stress related to health needs (pt-stated)     Current Barriers:  Mental health challenges if patient with Chronic Diagnoses of HTN, CKD, CHF Stress/Anxiety over health conditions Social isolation issues  Clinical Social Work Clinical Goal(s):  LCSW will talk with client in next 30 days to discuss client stress/anxiety regarding health conditions   Interventions: Talked with Bradley Torres, brother of client about CCM program Talked with Bradley Torres about RNCM support with [name] Talked with Bradley Torres about sleeping issues of client Talked with Bradley Torres about social support network (his brother is support) Talked with Doctor, general practice about relaxation techniques of client (watches TV,visiting with family) Talked with Bradley Torres about pain issues of client Talked with Bradley Torres about client referral  to Pain Clinic in [place]   Patient Self Care Activities:  Takes medications as prescribed  Patient Self Care Deficits Social isolation issues  Initial goal documentation        Follow Up Plan: LCSW to call client in next 4 weeks to talk with him about stress/anxiety issues he faces regarding health status  Bradley Torres.Bradley Torres MSW, LCSW Licensed Clinical Social Worker [facility] [phone]  I have reviewed and agree with the above  documentation.   Bradley Dun, [name]

## 2019-07-12 NOTE — Patient Instructions (Addendum)
Licensed Clinical Social Worker Visit Information  Goals we discussed today:  Goals    . Client will talk with LCSW in next 30 days to discuss anxiety and stress related to health needs (pt-stated)     Current Barriers:  Marland Kitchen Mental health challenges if patient with Chronic Diagnoses of HTN, CKD, CHF . Stress/Anxiety over health conditions . Social isolation issues  Clinical Social Work Clinical Goal(s):  Marland Kitchen LCSW will talk with client in next 30 days to discuss client stress/anxiety regarding health conditions   Interventions: Talked with [name], brother of client about CCM program  Talked with [name] about RNCM support with [place]  Talked with [name] about sleeping issues of client  Talked with [name] about social support network of client [name] is supportive of Geral Malpass )  Talked with [name] about relaxation techniques of client (watches TV,visiting with family) Talked with [name] about pain issues of client  Talked with [name] about client referral to Pain Clinic in [place]   Patient Self Care Activities:  . Takes medications as prescribed  Patient Self Care Deficits Social isolation issues  Initial goal documentation      Materials Provided: No  Follow Up Plan:LCSW to call client in next 4 weeks to talk with him about stress/anxiety issues he faces regarding health status  The patient/Mark Suire, brother of patient, verbalized understanding of instructions provided today and declined a print copy of patient instruction materials.   [name].Kentrell Guettler MSW, LCSW Licensed Clinical Social Worker [facility] [phone]

## 2019-07-23 ENCOUNTER — Ambulatory Visit: Payer: Medicaid Other | Admitting: *Deleted

## 2019-07-23 DIAGNOSIS — I10 Essential (primary) hypertension: Secondary | ICD-10-CM

## 2019-07-23 NOTE — Chronic Care Management (AMB) (Addendum)
  Chronic Care Management   Outreach Note  07/23/2019 Name: Bradley Torres MRN: [id number] DOB: May 27, 1960  Referred by: Sharion Balloon, [name] Reason for referral : Chronic Care Management (RN Initial Visit)   A 2nd unsuccessful Initial Telephone Visit was attempted today. The patient was referred to the case management team for assistance with care management and care coordination. He has Medicaid and is currently engaged with the CCM team LCSW. He has also been referred to pain management and psychiatry. Assistance with management of his chronic medical conditions may be needed.  Follow Up Plan: The care management team will reach out to the patient again over the next 30 days.   Chong Sicilian, BSN, RN-BC Embedded Chronic Care Manager [facility] Direct Dial: [phone]   I have reviewed and agree with the above  documentation.   Evelina Dun, [name]

## 2019-08-01 ENCOUNTER — Other Ambulatory Visit: Payer: Self-pay

## 2019-08-01 ENCOUNTER — Ambulatory Visit: Payer: Medicaid Other | Admitting: Neurology

## 2019-08-01 ENCOUNTER — Encounter: Payer: Self-pay | Admitting: Neurology

## 2019-08-01 VITALS — BP 151/83 | HR 63 | Temp 97.6°F | Ht 72.0 in | Wt >= 6400 oz

## 2019-08-01 DIAGNOSIS — Q2730 Arteriovenous malformation, site unspecified: Secondary | ICD-10-CM | POA: Diagnosis not present

## 2019-08-01 DIAGNOSIS — G4733 Obstructive sleep apnea (adult) (pediatric): Secondary | ICD-10-CM | POA: Insufficient documentation

## 2019-08-01 DIAGNOSIS — Q283 Other malformations of cerebral vessels: Secondary | ICD-10-CM

## 2019-08-01 NOTE — Patient Instructions (Signed)

## 2019-08-01 NOTE — Progress Notes (Signed)
SLEEP MEDICINE CLINIC   Provider:  Larey Seat, MD   Primary Care Physician:  Sharion Balloon, [name]   Referring Provider: Terald Sleeper, PA-C   Chief Complaint  Patient presents with  . Follow-up    pt alone,  Unvaccinated. rm 11.  presents today because he gets tired easily and feels extremely tired through out the day. pt had a SS 08/2018 but refused to came in for the titration study.- he has been refusing to quaranine or wear mask in the last year- has come around. Is  here to restart process     HPI:  Bradley Torres is a 59 y.o. male patient of [name], [name] - now NP [facility].  Bradley Torres underwent a baseline tolerate polysomnography in lab attended study on 12 September 2018, he had endorsed the Epworth Sleepiness Scale at 14 points at the time BMI was 51.7 past medical history has been cultured in previous notes, he was diagnosed with a supine AHI of 69.8/h general AHI of 33.2 and REM sleep AHI of 120/h.  I asked for an full night attended CPAP titration study to optimize therapy and allowing also for oxygen therapy if needed.  The patient tells me today that he was not contacted or be invited for a sleep study.  Also this is a severe case of apnea.  He was last seen by [name] on 01 May 2019 before she left the practice.  Admission in her note she encouraged to follow-up on chronic diastolic congestive heart failure chronic kidney disease and dyspnea on exertion wanting to have a revisit with the sleep study center.  I can offer Bradley Torres to ways to treat his condition I could start just an L2 titration and automatic L2 pilot device that gives a minimum pressure setting and can go up to a maximum pressure setting however I feel he has the better chance of a successful therapy if he could titrate the pressure in person.    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 Now on Xeralto. He is seen here on 06-05-2018 as in a referral  from [facility] for sleep evaluation. The patient is super-obese, deconditioned and has CKD grade 3; bordeline DM, and HTN. He was seen by [facility] pulmonologist Dr Melvyn Novas who ordered oxygen after ONO which was delivered to his house. He rejected  the oxygen as he wanted to see Sleep- Doc first. He never had a sleep study.   Chief complaint according to patient : The patient states that his sleep has not changed following the pulmonary embolism, but he was well aware of a sense of being doomed a great anxiety provoking sensation when he suffered the pulmonary embolism he also became limp and clumsy in his left hand and arm.  And he has a slight left facial droop.  He is is aware that he most likely suffered a stroke at the same time that he suffered a pulmonary embolism.  Sleep habits are as follows: Supper time is 7 Pm, and bedtime is variable - he sits in his recliner and since he can only heat his bedroom he spends the evening there. Watching TV.  He often falls asleep in his chair on and off, and by 3 or 4 AM he may decide to transfer to his bed.  He prefers a lateral sleep position, uses 2 pillows for head support.  The bed is not  adjustable.  He describes his sleep is very fragmented he wakes up every 1 or 2 hours, it is not always clear what wakes him.  Sometimes his mind is just busy and he stares at the ceiling. Is racing mind that keeps him from going back to sleep.  He estimates 5 to 6 hours of sleep in total but broken up and many small sleep phases. Rises at lunchtime, 12 noon. He feels rested.    Sleep medical history: see above.  Bradley Torres had been evaluated for chest pain but no cardiac reason was identified.  While he was hospitalized for pulmonary embolism at [facility] and he was hospitalized from October 30 th for 5 days , had cardiac Cath. He had PE and CVA on thanksgiving, was at [employer]. Depression.   Family sleep history:  Maternal uncle has OSA. Father was  alcoholic.   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 Never smoked, social drinker, caffeine _ iced tea - sweet 3 a day.    Review of Systems: Out of a complete 14 system review, the patient complains of only the following symptoms, and all other reviewed systems are negative.  left hand and arm clumsiness.  Poor sleep hygiene.  Epworth score: 14/ 24  , Fatigue severity score 62/ 63 - very high   , depression score.   Social History   Socioeconomic History  . Marital status: Single    Spouse name: Not on file  . Number of children: Not on file  . Years of education: Not on file  . Highest education level: Not on file  Occupational History  . Not on file  Tobacco Use  . Smoking status: Never Smoker  . Smokeless tobacco: Never Used  Substance and Sexual Activity  . Alcohol use: Yes    Comment: occ  . Drug use: Not Currently    Types: Marijuana    Comment: daily; 04/19/19 none past few weeks d/t problems breathing  . Sexual activity: Not Currently    Birth control/protection: None  Other Topics Concern  . Not on file  Social History Narrative  . Not on file   Social Determinants of Health   Financial Resource Strain:   . Difficulty of Paying Living Expenses:   Food Insecurity:   . Worried About Charity fundraiser in the Last Year:   . Arboriculturist in the Last Year:   Transportation Needs:   . Film/video editor (Medical):   Marland Kitchen Lack of Transportation (Non-Medical):   Physical Activity:   . Days of Exercise per Week:   . Minutes of Exercise per Session:   Stress: Stress Concern Present  . Feeling of Stress : Rather much  Social Connections:   . Frequency of Communication with Friends and Family:   . Frequency of Social Gatherings with Friends and Family:   . Attends Religious Services:   . Active Member of Clubs or Organizations:   . Attends Archivist Meetings:   Marland Kitchen  Marital Status:   Intimate Partner Violence:   . Fear of Current or Ex-Partner:   . Emotionally Abused:   Marland Kitchen Physically Abused:   . Sexually Abused:     Family History  Problem Relation Age of Onset  . Colon cancer Mother        died at age 57  . Heart attack Father   . Hepatitis Sister 80  Autoimmune  . Heart attack Paternal Grandfather     Past Medical History:  Diagnosis Date  . CAD (coronary artery disease)    a. 12/2017: cath showing 10% Proximal-LAD stenosis with no significant obstructive disease and LVEDP mildly elevated at 18 mm Hg.   Marland Kitchen Chronic back pain   . DVT (deep venous thrombosis) ([place])   . Essential hypertension   . Morbid obesity ([place])   . Panic disorder   . Prediabetes   . Shoulder pain, left    Following fall    Past Surgical History:  Procedure Laterality Date  . Ankle fracture sugery Left   . COLONOSCOPY WITH PROPOFOL N/A 05/29/2017   Dr. [name]: Multiple tubular adenomas removed, prep inadequate.  Short interval surveillance colonoscopy recommended in 6 months  . LEFT HEART CATH AND CORONARY ANGIOGRAPHY N/A 01/23/2018   Procedure: LEFT HEART CATH AND CORONARY ANGIOGRAPHY;  Surgeon: Troy Sine, MD;  Location: [facility];  Service: Cardiovascular;  Laterality: N/A;  . LUMBAR SPINE SURGERY     fusion  . POLYPECTOMY  05/29/2017   Procedure: POLYPECTOMY;  Surgeon: Daneil Dolin, MD;  Location[facility];  Service: Endoscopy;;  ascending colon polyp x5-cs transverse colon polyp x4- cs descending colon polyp x1- hs sigmoid colon polyp x1 -hs rectal polyp x1- hs  . TONSILLECTOMY      Current Outpatient Medications  Medication Sig Dispense Refill  . amLODipine (NORVASC) 5 MG tablet Take 1 tablet (5 mg total) by mouth daily. 90 tablet 3  . DULoxetine (CYMBALTA) 30 MG capsule Take 1 capsule (30 mg total) by mouth daily. 30 capsule 1  . gabapentin (NEURONTIN) 300 MG capsule Take 1 capsule (300 mg total) by mouth 3 (three) times daily.  90 capsule 3  . hydrochlorothiazide (HYDRODIURIL) 25 MG tablet Take 1 tablet (25 mg total) by mouth daily. 90 tablet 4  . metoprolol succinate (TOPROL-XL) 100 MG 24 hr tablet Take 1 tablet (100 mg total) by mouth daily. Take with or immediately following a meal. 30 tablet 3  . rivaroxaban (XARELTO) 20 MG TABS tablet TAKE 1 TABLET BY MOUTH DAILY WITH SUPPER 30 tablet 11   No current facility-administered medications for this visit.    Allergies as of 08/01/2019 - Review Complete 08/01/2019  Allergen Reaction Noted  . Ibuprofen Other (See Comments) 11/20/2013  . Lisinopril  05/13/2019  . Diflucan [fluconazole] Rash 01/19/2018  . Losartan Rash 03/17/2016    Vitals: BP (!) 151/83   Pulse 63   Temp 97.6 F (36.4 C)   Ht 6' (1.829 m)   Wt (!) 408 lb (185.1 kg)   BMI 55.33 kg/m  Last Weight:  Wt Readings from Last 1 Encounters:  08/01/19 (!) 408 lb (185.1 kg)   [id number] mass index is 55.33 kg/m.     Last Height:   Ht Readings from Last 1 Encounters:  08/01/19 6' (1.829 m)    Physical exam:  General: The patient is awake, alert and appears not in acute distress. The patient is super obese.  Head: Normocephalic, atraumatic. Neck is supple. Mallampati: 4,  neck circumference:17. 5". Nasal airflow: patent ,  high grade Retrognathia is seen.  Cardiovascular:  Regular rate and rhythm , without  murmurs or carotid bruit, and without distended neck veins. Respiratory: Lungs are clear to auscultation. Skin:  Without evidence of edema, or rash Trunk: BMI is 52 kg/m2  The patient's posture is slouched.   Neurologic exam : The patient is awake and  alert, oriented to place and time.   Memory subjective  described as intact.   Attention span & concentration ability appears normal.  Speech is fluent, without dysarthria, dysphonia or aphasia.  Mood and affect are depressed.  Cranial nerves: Pupils are equal and briskly reactive to light. . Extraocular movements  in vertical and  horizontal planes intact and without nystagmus. Visual fields by finger perimetry are intact. Hearing to finger rub intact.   Facial sensation intact to fine touch.  Facial motor strength is ssymmetric with a lower left facial droop- and tongue  moves midline. Shoulder shrug was symmetrical.   Motor exam:   Elevated biceps  tone, equal muscle bulk , has rotator cuff injury on the left ( several years) , he has weak left sided grip.  Sensory:  Fine touch, pinprick and vibration were tested in all extremities. Proprioception - pronator drift on the left.  Left hand reduced vibration and grip.   Coordination: Rapid alternating movements in the fingers/hands was normal. Finger-to-nose maneuver with dysmetria on the left,   Gait and station: Patient walks without assistive device . Deep tendon reflexes: in the  upper and lower extremities are symmetrically attenuated and intact.  Babinski maneuver response is down going/ equivocal.   Component 2 mo ago  Summary Note   Comment: ====================================================================  [employer] Select 13 (MW)  ====================================================================  Test               Result    Flag    Units  Drug Present and Declared for Prescription Verification   Oxycodone           191     EXPECTED  ng/mg creat   Oxymorphone          741     EXPECTED  ng/mg creat   Noroxycodone          121     EXPECTED  ng/mg creat   Noroxymorphone         70      EXPECTED  ng/mg creat   Sources of oxycodone are scheduled prescription medications.   Oxymorphone, noroxycodone, and noroxymorphone are expected   metabolites of oxycodone. Oxymorphone is also available as a   scheduled prescription medication.  Drug Present not Declared for Prescription Verification   Carboxy-THC          295     UNEXPECTED ng/mg creat    Carboxy-THC is a metabolite of tetrahydrocannabinol (THC). Source     06/19/2018 1:32 PM EDT    IMPRESSION: This MRI of the brain without contrast shows the following: 1.  Right posterior frontal focus involving gray and white matter and  associated with chronic heme products. This is most consistent with a  remote infarction though focal trauma might also have a similar  appearance. 2.  Minimal chronic microvascular ischemic changes in the cerebellum and  hemispheres. 3.  Right maxillary chronic sinusitis. 4.  There are no acute findings.  Finding is consistent with recovery from a brain bleed right post. frontal area. Previous stroke or trauma can be a cause- this is remote , not acute. 2-3 month minimum time elapsed.   Larey Seat, MD     Assessment:  After physical and neurologic examination, review of laboratory studies,  Personal review of imaging studies, reports of other /same  Imaging studies, results of polysomnography and / or neurophysiology testing and pre-existing records as far as provided in visit., my assessment is:   1)  Status  post PE- and likely CVA. See MRI - There is a plethora of comorbidities, Bradley Torres has been severely deconditioned, at this stage she is considered super obese, he has developed kidney failure, elevated blood sugars, hypertension, and he will be on chronic anticoagulation. He is dizzy and his MRI showed a bleed in resorption.   The patient needs a sleep study and I appreciate Dr Gustavus Bryant assessment and diagnosis of oxygen dependency.I encouraged Bradley Torres to use oxygen at night even before he has the sleep study.   2) attended sleep study re-ordered - this time for CPAP - attended sleep study . patient had declined the previous invitation , did not want to wear mask or quarantined I have no idea why he wasn't dismissed. (?).    Patient likely need o2 here at night. Consider referral to medical weight management and nephrology.     The patient was advised of the nature of the diagnosed disorder , the treatment options and the  risks for general health and wellness arising from not treating the condition.   I spent more than 55 minutes of face to face time with the patient.  Greater than 50% of time was spent in counseling and coordination of care. We have discussed the diagnosis and differential and I answered the patient's questions.    RV after sleep study . Larey Seat, MD [id number], [id number] PM  Certified in Neurology by ABPN Certified in [place] by [facility] [address] [place], [place] [address]

## 2019-08-05 ENCOUNTER — Telehealth: Payer: Self-pay

## 2019-08-05 ENCOUNTER — Telehealth: Payer: Self-pay | Admitting: Neurology

## 2019-08-05 DIAGNOSIS — G4733 Obstructive sleep apnea (adult) (pediatric): Secondary | ICD-10-CM

## 2019-08-05 DIAGNOSIS — G4734 Idiopathic sleep related nonobstructive alveolar hypoventilation: Secondary | ICD-10-CM

## 2019-08-05 DIAGNOSIS — J9621 Acute and chronic respiratory failure with hypoxia: Secondary | ICD-10-CM

## 2019-08-05 DIAGNOSIS — Z72821 Inadequate sleep hygiene: Secondary | ICD-10-CM

## 2019-08-05 NOTE — Telephone Encounter (Signed)
Anything to make him get a machine quickly- if he is covid vaccinated, please do SPLIT.

## 2019-08-05 NOTE — Telephone Encounter (Signed)
Medicaid order sent to GI. They will obtain the auth and reach out to the patient to schedule.  

## 2019-08-05 NOTE — Telephone Encounter (Signed)
Need a split night order for patient. With medicaid sleep study has to be within 6 months to use and his was in June of 2020. We can split him due to his previous diagnosis of OSA.

## 2019-08-05 NOTE — Addendum Note (Signed)
Addended by: Larey Seat on: 08/05/2019 06:58 PM   Modules accepted: Orders

## 2019-08-05 NOTE — Telephone Encounter (Signed)
Order placed for the patient for split night.

## 2019-08-08 ENCOUNTER — Telehealth: Payer: Self-pay

## 2019-08-08 NOTE — Telephone Encounter (Signed)
LVM for pt to call me back to schedule sleep study  

## 2019-08-12 ENCOUNTER — Ambulatory Visit: Payer: Self-pay | Admitting: Licensed Clinical Social Worker

## 2019-08-12 DIAGNOSIS — F419 Anxiety disorder, unspecified: Secondary | ICD-10-CM

## 2019-08-12 DIAGNOSIS — I509 Heart failure, unspecified: Secondary | ICD-10-CM

## 2019-08-12 DIAGNOSIS — I10 Essential (primary) hypertension: Secondary | ICD-10-CM

## 2019-08-12 DIAGNOSIS — N183 Chronic kidney disease, stage 3 unspecified: Secondary | ICD-10-CM

## 2019-08-12 NOTE — Patient Instructions (Addendum)
Licensed Clinical Social Worker Visit Information  Materials Provided: No  Goals    . Client will talk with LCSW in next 30 days to discuss anxiety and stress related to health needs (pt-stated)     Current Barriers:  Marland Kitchen Mental health challenges if patient with Chronic Diagnoses of HTN, CKD, CHF . Stress/Anxiety over health conditions . Social isolation issues  Clinical Social Work Clinical Goal(s):  Marland Kitchen LCSW will talk with client in next 30 days to discuss client stress/anxiety regarding health conditions   Interventions: Talked with [name], brother of client, previously about CCM program  Talked with [name] about RNCM support with [place]  Talked with [name] about sleeping issues of client (Sleep study has been ordered for client)  Talked with [name] about social support network for client (client's brother is supportive)  Talked with [name] about relaxation techniques of client (watches TV,visiting with family) Talked with [name] about pain issues of client  Talked with [name] about client referral to Pain Clinic in [place]  Talked with [name] about DME equipment used by client  Patient Self Care Activities:  . Takes medications as prescribed  Patient Self Care Deficits Social isolation issues  Initial goal documentation      Follow Up Plan:LCSW to call client/Mark Charvat, brother, in next 4 weeks to talk with client/brother about stress/anxiety issues client faces regarding health status of client  The patient/Mark Chavira, brother, verbalized understanding of instructions provided today and declined a print copy of patient instruction materials.   [name].Kalob Bergen MSW, LCSW Licensed Clinical Social Worker [facility] Care Management [phone]

## 2019-08-12 NOTE — Chronic Care Management (AMB) (Addendum)
Chronic Care Management    Clinical Social Work Follow Up Note  08/12/2019 Name: Bradley Torres MRN: [id number] DOB: 1961/03/19  Bradley Torres is a 59 y.o. year old male who is a primary care patient of Sharion Balloon, [name]. The CCM team was consulted for assistance with [name] .   Review of patient status, including review of consultants reports, other relevant assessments, and collaboration with appropriate care team members and the patient's provider was performed as part of comprehensive patient evaluation and provision of chronic care management services.    SDOH (Social Determinants of Health) assessments performed: Yes;risk for tobacco use; risk for depression risk for stress    Chronic Care Management from 05/15/2019 in [facility]  PHQ-9 Total Score  6         GAD 7 : Generalized Anxiety Score 05/15/2019 05/13/2019  Nervous, Anxious, on Edge 1 3  Control/stop worrying 1 3  Worry too much - different things 1 3  Trouble relaxing 1 3  Restless 0 2  Easily annoyed or irritable 0 3  Afraid - awful might happen 1 3  Total GAD 7 Score 5 20  Anxiety Difficulty Somewhat difficult Somewhat difficult     Outpatient Encounter Medications as of 08/12/2019  Medication Sig   amLODipine (NORVASC) 5 MG tablet Take 1 tablet (5 mg total) by mouth daily.   DULoxetine (CYMBALTA) 30 MG capsule Take 1 capsule (30 mg total) by mouth daily.   gabapentin (NEURONTIN) 300 MG capsule Take 1 capsule (300 mg total) by mouth 3 (three) times daily.   hydrochlorothiazide (HYDRODIURIL) 25 MG tablet Take 1 tablet (25 mg total) by mouth daily.   metoprolol succinate (TOPROL-XL) 100 MG 24 hr tablet Take 1 tablet (100 mg total) by mouth daily. Take with or immediately following a meal.   rivaroxaban (XARELTO) 20 MG TABS tablet TAKE 1 TABLET BY MOUTH DAILY WITH SUPPER   No facility-administered encounter medications on file as of 08/12/2019.    Goals      Client will  talk with LCSW in next 30 days to discuss anxiety and stress related to health needs (pt-stated)     Current Barriers:  Mental health challenges if patient with Chronic Diagnoses of HTN, CKD, CHF Stress/Anxiety over health conditions Social isolation issues  Clinical Social Work Clinical Goal(s):  LCSW will talk with client in next 30 days to discuss client stress/anxiety regarding health conditions   Interventions:  Talked with [name], brother of client previously about CCM program Talked with [name] about RNCM support with [name] Talked with [name] about sleeping issues of client (Sleep study has been ordered for client) Talked with [name] about social support network for client (client's brother is supportive) Talked with [name] about relaxation techniques of client (watches TV,visiting with family) Talked with [name] about pain issues of client Talked with [name] about client referral  to Pain Clinic in [place]  Talked with [name] about DME equipment used by client    Patient Self Care Activities:  Takes medications as prescribed  Patient Self Care Deficits Social isolation issues  Initial goal documentation         Follow Up Plan: LCSW to call client/[name], brother, in next 4 weeks to talk with client/brother about stress/anxiety issues client faces regarding health status of client  Bradley Torres MSW, LCSW Licensed Clinical Social Worker [facility] Care [facility] [phone]  I have reviewed and agree with the above documentation.  Evelina Dun, [name]

## 2019-08-19 ENCOUNTER — Telehealth: Payer: Self-pay | Admitting: Family

## 2019-08-19 NOTE — Telephone Encounter (Signed)
Yes it is safe, I would recommend the [place] or [employer].

## 2019-08-19 NOTE — Telephone Encounter (Signed)
Aware of provider's advice. 

## 2019-08-20 ENCOUNTER — Ambulatory Visit: Payer: Medicaid Other | Admitting: *Deleted

## 2019-08-20 ENCOUNTER — Telehealth: Payer: Self-pay

## 2019-08-20 NOTE — Telephone Encounter (Signed)
Pt isn't ready to schedule sleep study at this time. He does not want to get covid tested. Pt is wanting to get vaccinated first. Once he receives second vax, pt will call me back to schedule sleep study.

## 2019-08-20 NOTE — Chronic Care Management (AMB) (Addendum)
  Care Management   Outreach Note  08/20/2019 Name: Bradley Torres MRN: [id number] DOB: 10-Mar-1961  Referred by: Sharion Balloon, [name] Reason for referral : Chronic Care Management (RN 3rd Initial Visit Attempt)   A 3rd unsuccessful telephone outreach was attempted today. The patient was referred to the case management team for assistance with care management and care coordination. He is connected with Theadore Nan, LCSW but has not had a visit with RN Care Manager.   Follow Up Plan: A HIPPA compliant phone message was left for the patient providing contact information and requesting a return call.   Chong Sicilian, BSN, RN-BC Embedded Chronic Care Manager [facility] Management Direct Dial: [phone]   I have reviewed and agree with the above  documentation.   Evelina Dun, [name]

## 2019-08-22 DIAGNOSIS — Z23 Encounter for immunization: Secondary | ICD-10-CM | POA: Diagnosis not present

## 2019-09-04 ENCOUNTER — Ambulatory Visit
Admission: RE | Admit: 2019-09-04 | Discharge: 2019-09-04 | Disposition: A | Discharge status: Home / Self Care | Payer: Medicaid Other | Source: Ambulatory Visit | Attending: Neurology | Admitting: Neurology

## 2019-09-04 ENCOUNTER — Other Ambulatory Visit: Payer: Self-pay

## 2019-09-04 DIAGNOSIS — Q283 Other malformations of cerebral vessels: Secondary | ICD-10-CM

## 2019-09-04 DIAGNOSIS — Q2730 Arteriovenous malformation, site unspecified: Secondary | ICD-10-CM

## 2019-09-05 NOTE — Progress Notes (Signed)
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 Leta Baptist,

## 2019-09-09 ENCOUNTER — Telehealth: Payer: Self-pay

## 2019-09-09 NOTE — Telephone Encounter (Signed)
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 Leta Baptist,

## 2019-09-11 ENCOUNTER — Encounter: Payer: Self-pay | Admitting: Neurology

## 2019-09-12 ENCOUNTER — Telehealth: Payer: Self-pay | Admitting: Neurology

## 2019-09-12 NOTE — Telephone Encounter (Signed)
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 Leta Baptist,

## 2019-09-12 NOTE — Telephone Encounter (Signed)
Called the patient to advise of the MRI findings. There was no answer. LVM detailing the results and informing a mychart message was also sent. Advised to call with any questions.   **If patient calls back please advise MRI was normal and there was nothing concerning on MRI.

## 2019-09-17 ENCOUNTER — Ambulatory Visit: Payer: Self-pay | Admitting: Licensed Clinical Social Worker

## 2019-09-17 DIAGNOSIS — I10 Essential (primary) hypertension: Secondary | ICD-10-CM

## 2019-09-17 DIAGNOSIS — I5032 Chronic diastolic (congestive) heart failure: Secondary | ICD-10-CM

## 2019-09-17 DIAGNOSIS — N183 Chronic kidney disease, stage 3 unspecified: Secondary | ICD-10-CM

## 2019-09-17 NOTE — Chronic Care Management (AMB) (Addendum)
Chronic Care Management    Clinical Social Work Follow Up Note  09/17/2019 Name: Bradley Torres MRN: [id number] DOB: 01/11/1961  Bradley Torres is a 58 y.o. year old male who is a primary care patient of Sharion Balloon, [name]. The CCM team was consulted for assistance with [name] .   Review of patient status, including review of consultants reports, other relevant assessments, and collaboration with appropriate care team members and the patient's provider was performed as part of comprehensive patient evaluation and provision of chronic care management services.    SDOH (Social Determinants of Health) assessments performed: No;risk for stress; risk for tobacco use; risk for depression    Chronic Care Management from 05/15/2019 in [place]  PHQ-9 Total Score 6         GAD 7 : Generalized Anxiety Score 05/15/2019 05/13/2019  Nervous, Anxious, on Edge 1 3  Control/stop worrying 1 3  Worry too much - different things 1 3  Trouble relaxing 1 3  Restless 0 2  Easily annoyed or irritable 0 3  Afraid - awful might happen 1 3  Total GAD 7 Score 5 20  Anxiety Difficulty Somewhat difficult Somewhat difficult    Outpatient Encounter Medications as of 09/17/2019  Medication Sig   amLODipine (NORVASC) 5 MG tablet Take 1 tablet (5 mg total) by mouth daily.   DULoxetine (CYMBALTA) 30 MG capsule Take 1 capsule (30 mg total) by mouth daily.   gabapentin (NEURONTIN) 300 MG capsule Take 1 capsule (300 mg total) by mouth 3 (three) times daily.   hydrochlorothiazide (HYDRODIURIL) 25 MG tablet Take 1 tablet (25 mg total) by mouth daily.   metoprolol succinate (TOPROL-XL) 100 MG 24 hr tablet Take 1 tablet (100 mg total) by mouth daily. Take with or immediately following a meal.   rivaroxaban (XARELTO) 20 MG TABS tablet TAKE 1 TABLET BY MOUTH DAILY WITH SUPPER   No facility-administered encounter medications on file as of 09/17/2019.    Goals       Client will  talk with LCSW in next 30 days to discuss anxiety and stress related to health needs (pt-stated)      Current Barriers:  Mental health challenges if patient with Chronic Diagnoses of HTN, CKD, CHF Stress/Anxiety over health conditions Social isolation issues  Clinical Social Work Clinical Goal(s):  LCSW will talk with client in next 30 days to discuss client stress/anxiety regarding health conditions   Interventions: Talked with Bradley Torres about RNCM support with [place] Talked with Bradley Torres about sleeping issues of client Talked with client about social support network (his brother is support) Talked with client about [facility] in [place], [place] Talked with client about relaxation techniques (watches TV) Talked with client about pain issues of client Talked with client about client referral  to Pain Clinic in [place]  Talked with client about DME equipment used by client Talked with client about his upcoming medical appointments Provided client with phone number of [facility] in [place], [place] [phone]) Talked with client about dizziness issues of client  Talked with Bradley Torres about occasional headaches of client   Patient Self Care Activities:  Takes medications as prescribed  Patient Self Care Deficits Social isolation issues  Initial goal documentation       Follow Up Plan: LCSW to call client/Bradley Torres, brother, in next 4 weeks to talk with client/brother about stress/anxiety issues client faces regarding health status of client  Bradley Torres MSW, LCSW Licensed Clinical Social  Worker [facility] Care Management [phone]  I have reviewed the CCM documentation and agree with the written assessment and plan of care.  Evelina Dun, [name]

## 2019-09-17 NOTE — Patient Instructions (Addendum)
Licensed Clinical Social Worker Visit Information  Goals we discussed today:    Client will talk with LCSW in next 30 days to discuss anxiety and stress related to health needs (pt-stated)           Current Barriers:   Mental health challenges if patient with Chronic Diagnoses of HTN, CKD, CHF  Stress/Anxiety over health conditions  Social isolation issues  Clinical Social Work Clinical Goal(s):   LCSW will talk with client in next 30 days to discuss client stress/anxiety regarding health conditions   Interventions:  Talked with [name] about RNCM support with [facility]  Talked with Yash about sleeping issues of client  Talked with client about social support network (his brother is support)  Talked with client about [facility] in [place], [place]  Talked with client about relaxation techniques (watches TV)  Talked withclient about pain issues of client  Talked withclient aboutclient referral to Pain Clinic in [place]  Talked with client about DME equipment used by client  Talked with client about his upcoming medical appointments  Provided client with phone number of [facility] in [place], [place] [phone])  Talked with client about dizziness issues of client   Talked with Darral about occasional headaches of client   Patient Self Care Activities:   Takes medications as prescribed  Patient Self Care Deficits Social isolation issues  Initial goal documentation      Follow Up Plan: LCSW to call client/Mark Melina Copa, brother,in next 4 weeks to talk with client/brotherabout stress/anxiety issuesclientfaces regarding health statusof client  Materials Provided: No  The patient verbalized understanding of instructions provided today and declined a print copy of patient instruction materials.   [name].Alontae Chaloux MSW, LCSW Licensed Clinical Social Worker [facility] Care  Management [phone]

## 2019-09-19 DIAGNOSIS — Z23 Encounter for immunization: Secondary | ICD-10-CM | POA: Diagnosis not present

## 2019-09-23 ENCOUNTER — Telehealth: Payer: Self-pay | Admitting: Neurology

## 2019-09-23 NOTE — Telephone Encounter (Signed)
Patient called stating he is suppose to get a sleep study scheduled.

## 2019-09-24 NOTE — Telephone Encounter (Signed)
LVM on 6/28 & 09/24/2019 for pt to call me back to schedule sleep study

## 2019-10-16 ENCOUNTER — Other Ambulatory Visit: Payer: Self-pay

## 2019-10-16 ENCOUNTER — Ambulatory Visit (INDEPENDENT_AMBULATORY_CARE_PROVIDER_SITE_OTHER): Payer: Medicaid Other | Admitting: Neurology

## 2019-10-16 DIAGNOSIS — G4733 Obstructive sleep apnea (adult) (pediatric): Secondary | ICD-10-CM | POA: Diagnosis not present

## 2019-10-16 DIAGNOSIS — Z87898 Personal history of other specified conditions: Secondary | ICD-10-CM

## 2019-10-16 DIAGNOSIS — J9621 Acute and chronic respiratory failure with hypoxia: Secondary | ICD-10-CM

## 2019-10-16 DIAGNOSIS — J449 Chronic obstructive pulmonary disease, unspecified: Secondary | ICD-10-CM

## 2019-10-16 DIAGNOSIS — G4734 Idiopathic sleep related nonobstructive alveolar hypoventilation: Secondary | ICD-10-CM

## 2019-10-16 DIAGNOSIS — Z72821 Inadequate sleep hygiene: Secondary | ICD-10-CM

## 2019-10-23 ENCOUNTER — Other Ambulatory Visit: Payer: Self-pay | Admitting: Family

## 2019-10-23 ENCOUNTER — Ambulatory Visit: Payer: Medicaid Other | Admitting: Licensed Clinical Social Worker

## 2019-10-23 DIAGNOSIS — I10 Essential (primary) hypertension: Secondary | ICD-10-CM

## 2019-10-23 DIAGNOSIS — N1831 Chronic kidney disease, stage 3a: Secondary | ICD-10-CM

## 2019-10-23 DIAGNOSIS — I509 Heart failure, unspecified: Secondary | ICD-10-CM

## 2019-10-23 DIAGNOSIS — I5032 Chronic diastolic (congestive) heart failure: Secondary | ICD-10-CM

## 2019-10-23 NOTE — Patient Instructions (Addendum)
Licensed Clinical Social Worker Visit Information  Goals we discussed today:     Client will talk with LCSW in next 30 days to discuss anxiety and stress related to health needs (pt-stated)          Current Barriers:   Mental health challenges if patient with Chronic Diagnoses of HTN, CKD, CHF  Stress/Anxiety over health conditions  Social isolation issues  Clinical Social Work Clinical Goal(s):   LCSW will talk with client in next 30 days to discuss client stress/anxiety regarding health conditions   Interventions:  Talked with Bradley Torres, brother of client Bradley Torres about RNCM support with [facility]  Talked with client previously about [name] in [place], [place]  Talked with client about relaxation techniques (watches TV)  Talked withMark Melina Torres about pain issues of client  Talked withclient previouslyaboutclient referral to Pain Clinic in [place]  Previously provided client with phone number of [facility] in [place], [place] [phone])  Talked with Bradley Torres about dizziness issues of client   Patient Self Care Activities:   Takes medications as prescribed  Patient Self Care Deficits Social isolation issues  Initial goal documentation      Follow Up Plan:LCSW to call client/Bradley Torres, brother,in next 4 weeks to talk with client/brotherabout stress/anxiety issuesclientfaces regarding health statusof client  Materials Provided: No  The patient/Bradley Torres, brother of patient, verbalized understanding of instructions provided today and declined a print copy of patient instruction materials.   Bradley Torres.Bradley Torres MSW, LCSW Licensed Clinical Social Worker [facility] [phone]

## 2019-10-23 NOTE — Chronic Care Management (AMB) (Addendum)
°  Chronic Care Management    Clinical Social Work Follow Up Note  10/23/2019 Name: CHARLOTTE BRAFFORD MRN: [id number] DOB: January 10, 1961  Bradley Torres is a 59 y.o. year old male who is a primary care patient of Sharion Balloon, [name]. The CCM team was consulted for assistance with [name] .   Review of patient status, including review of consultants reports, other relevant assessments, and collaboration with appropriate care team members and the patient's provider was performed as part of comprehensive patient evaluation and provision of chronic care management services.    SDOH (Social Determinants of Health) assessments performed: No; risk for tobacco use; risk for depression; risk for stress    Chronic Care Management from 05/15/2019 in [place]  PHQ-9 Total Score 6      GAD 7 : Generalized Anxiety Score 05/15/2019 05/13/2019  Nervous, Anxious, on Edge 1 3  Control/stop worrying 1 3  Worry too much - different things 1 3  Trouble relaxing 1 3  Restless 0 2  Easily annoyed or irritable 0 3  Afraid - awful might happen 1 3  Total GAD 7 Score 5 20  Anxiety Difficulty Somewhat difficult Somewhat difficult     Outpatient Encounter Medications as of 10/23/2019  Medication Sig   amLODipine (NORVASC) 5 MG tablet Take 1 tablet (5 mg total) by mouth daily.   DULoxetine (CYMBALTA) 30 MG capsule Take 1 capsule (30 mg total) by mouth daily.   gabapentin (NEURONTIN) 300 MG capsule Take 1 capsule (300 mg total) by mouth 3 (three) times daily.   hydrochlorothiazide (HYDRODIURIL) 25 MG tablet Take 1 tablet (25 mg total) by mouth daily.   metoprolol succinate (TOPROL-XL) 100 MG 24 hr tablet TAKE 1 TABLET BY MOUTH DAILY. take with OR immediately following a meal   rivaroxaban (XARELTO) 20 MG TABS tablet TAKE 1 TABLET BY MOUTH DAILY WITH SUPPER   No facility-administered encounter medications on file as of 10/23/2019.    Goals       Client will talk with LCSW in  next 30 days to discuss anxiety and stress related to health needs (pt-stated)      Current Barriers:  Mental health challenges if patient with Chronic Diagnoses of HTN, CKD, CHF Stress/Anxiety over health conditions Social isolation issues  Clinical Social Work Clinical Goal(s):  LCSW will talk with client in next 30 days to discuss client stress/anxiety regarding health conditions   Interventions: Talked with [name], brother of client Dianna about RNCM support with [facility] Talked with client previously about [facility] in [place], [place] Talked with client about relaxation techniques (watches TV) Talked with [name] about pain issues of client Talked with client previously about client referral  to Pain Clinic in [place]  Previously provided client with phone number of [facility] in [place], [place] [phone]) Talked with [name] about dizziness issues of client   Patient Self Care Activities:  Takes medications as prescribed  Patient Self Care Deficits Social isolation issues  Initial goal documentation       Follow Up Plan: LCSW to call client/Mark Hua, brother, in next 4 weeks to talk with client/brother about stress/anxiety issues client faces regarding health status of client  [name].Dj Senteno MSW, LCSW Licensed Clinical Social Worker [facility] [phone]  I have reviewed the CCM documentation and agree with the written assessment and plan of care.  Evelina Dun, [name]

## 2019-11-01 DIAGNOSIS — Z87898 Personal history of other specified conditions: Secondary | ICD-10-CM | POA: Insufficient documentation

## 2019-11-01 DIAGNOSIS — J449 Chronic obstructive pulmonary disease, unspecified: Secondary | ICD-10-CM | POA: Insufficient documentation

## 2019-11-01 DIAGNOSIS — G4733 Obstructive sleep apnea (adult) (pediatric): Secondary | ICD-10-CM | POA: Insufficient documentation

## 2019-11-01 DIAGNOSIS — G4734 Idiopathic sleep related nonobstructive alveolar hypoventilation: Secondary | ICD-10-CM | POA: Insufficient documentation

## 2019-11-01 NOTE — Progress Notes (Signed)
POLYSOMNOGRAPHY IMPRESSION :   1. Severe Obstructive Sleep Apnea (OSA) with a very high AHI of  78.6/h. no REM sleep or supine sleep were recorded. All events  were hypopneas. Hypoxemia was severe, too. Snoring and irregular  heart rate were noted. 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  2. The patient responded well to CPAP at 12 and 123 cm water  pressure, nadir of oxygen improved, sleep efficiency and sleep  architecture normalized. EKG remained abnormal.    RECOMMENDATIONS: Auto titration capable CPAP device with a  setting from 8-16 cm water pressure, 2 cm EPR and heated  humidity.  Post-study, the patient indicated that sleep was much better than  usual.  The patient was fitted with FFM ResMed F 20 in large.

## 2019-11-01 NOTE — Procedures (Signed)
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 Now on Xeralto. He is seen here on 06-05-2018 as in a referral from [place] for sleep evaluation. At the time of his PSG appointment , the sleep lab was closed - pandemic related. The patient is super-obese, deconditioned, has COPD, and has CKD grade 3; "borderline" DM, and HTN. He also chose not to get vaccinated for Covid.  He was seen by [facility] pulmonologist Dr Melvyn Novas who ordered oxygen after an ONO, which was delivered to his house. He rejected the oxygen as he wanted to see Sleep- Doc first. He never had a sleep study. I ordered a SPLIT night , if needed with oxygen supplementation.   The patient endorsed the Epworth Sleepiness Scale at 14 points   The patient's weight 408 pounds with a height of 72 (inches), resulting in a BMI of 55.2 kg/m2. The patient's neck circumference measured 18 inches.  CURRENT MEDICATIONS: Norvasc, Cymbalta, Toprol-XL, Percocet, Xarelto, Hydrodiuril, Lamisil   PROCEDURE:  This is a multichannel digital polysomnogram utilizing the Somnostar 11.2 system.  Electrodes and sensors were applied and monitored per AASM Specifications.   EEG, EOG, Chin and Limb EMG, were sampled at 200 Hz.  ECG, Snore and Nasal Pressure, Thermal Airflow, Respiratory Effort, CPAP Flow and Pressure, Oximetry was sampled at 50 Hz. Digital video and audio were recorded.      BASELINE STUDY WITHOUT CPAP RESULTS:  Lights Out was at 22:57 and Lights On at 05:01.  Total recording time (TRT) was 148, with a total sleep time (TST) of 103 minutes.   The patient's sleep latency was 33 minutes.  REM latency was 0 minutes.  The sleep efficiency was 69.6 %.    SLEEP ARCHITECTURE: WASO (Wake after  sleep onset) was 23 minutes, Stage N1 was 11.5 minutes, Stage N2 was 76.5 minutes, Stage N3 was 15 minutes and Stage R (REM sleep) was 0 minutes.  The percentages were Stage N1 11.2%, Stage N2 74.3%, Stage N3 14.6% and Stage R (REM sleep) 0%.   RESPIRATORY ANALYSIS:  There were a total of 135 respiratory events:  0 obstructive apneas, 0 central apneas and 0 mixed apneas with a total of 0 apneas and an apnea index (AI) of 0. There were 135 hypopneas with a hypopnea index of 78.6.     The total APNEA/HYPOPNEA INDEX (AHI) was 78.6 /hour.  0 events occurred in REM sleep and 270 events in NREM. The REM AHI was 0 /hour versus a non-REM AHI of 78.6 /hour. The patient spent 1 minutes sleep time in the supine position 296 minutes in non-supine. The supine AHI was 0.0 /hour versus a non-supine AHI of 79.4 /hour.  OXYGEN SATURATION & C02:  The wake baseline 02 saturation was 94%, with the lowest being 84%. Time spent below 89% saturation equaled 20 minutes. The arousals were noted as: 9 were spontaneous, 0 were associated with PLMs, 120 were associated with respiratory events.  Audio and video analysis did not show any abnormal or unusual movements, behaviors, phonations or vocalizations Snoring was noted EKG was abnormal, with irregular rate and rhythm.   TITRATION STUDY WITH CPAP RESULTS:   CPAP was initiated  under a FFM at 5.0 cmH20 with heated humidity per AASM split night standards.  This pressure was advanced to 13 cmH20 because of hypopneas, apneas and desaturations.  At a PAP pressure of 13 cmH20, there was a reduction of the AHI to 0.0 /hour. The patient rested well for 43 minutes at this final pressure. He entered REM sleep at 10 cm water pressure.   Total recording time (TRT) was 216.5 minutes, with a total sleep time (TST) of 193.5 minutes. The patient's sleep latency was 17.5 minutes. REM latency was brief at only 27.5 minutes.   The sleep efficiency was 89.4 %.    SLEEP ARCHITECTURE: Wake  after sleep was 11.5 minutes, Stage N1 4.5 minutes, Stage N2 62.5 minutes, Stage N3 37.5 minutes and Stage R (REM sleep) 89 minutes. The percentages were: Stage N1 2.3%, Stage N2 32.3%, Stage N3 19.4% and Stage R (REM sleep) 46.%.  RESPIRATORY ANALYSIS:  There were a total of 26 respiratory events: 0 obstructive apneas, 0 central apneas and 0 mixed apneas with a total of 0 apneas and an apnea index (AI) of 0. There were 26 hypopneas with a hypopnea index of 8.1 /hour. The patient also had 0 respiratory event related arousals (RERAs).      The total APNEA/HYPOPNEA INDEX (AHI) was 8.1 /hour and the total RESPIRATORY DISTURBANCE INDEX was 8.1 /hour.  10 events occurred in REM sleep and 16 events in NREM. The REM AHI was 6.7 /hour versus a non-REM AHI of 9.2 /hour. The patient spent 0% of total sleep time in the supine position. The supine AHI was 0.0 /hour, versus a non-supine AHI of 8.1/hour.  OXYGEN SATURATION & C02:  The wake baseline 02 saturation was 94%, with the lowest being 86%. Time spent below 89% saturation equaled 1 minutes.  The arousals were noted as: 5 were spontaneous, 0 were associated with PLMs, 15 were associated with respiratory events. Post-study, the patient indicated that sleep was much better than usual.  The patient was fitted with FFM ResMed F 20 in large.     POLYSOMNOGRAPHY IMPRESSION :   1. Severe Obstructive Sleep Apnea (OSA) with a very high AHI of 78.6/h. no REM sleep or supine sleep were recorded. All events were hypopneas. Hypoxemia was severe, too. Snoring and irregular heart rate were noted. The diagnosis is likely COPD hypoventilation overlapping with OSA.  2. The patient responded well to CPAP at 12 and 123 cm water pressure, nadir of oxygen improved, sleep efficiency and sleep architecture normalized. EKG remained abnormal.   RECOMMENDATIONS:  Auto titration capable CPAP device with a setting from 8-16 cm water pressure, 2 cm EPR and heated humidity.   Post-study, the patient indicated that sleep was much better than usual. The patient was fitted with FFM ResMed F 20 in large.   A follow up appointment will be scheduled with NP in the Sleep Clinic at [facility].      I certify that I have reviewed the entire raw data recording prior to the issuance of this report in accordance with the Standards of Accreditation of the American Academy of Sleep Medicine (AASM)    Larey Seat, M.D. Diplomat, Tax adviser of Psychiatry and Neurology  Diplomat, Tax adviser of Sleep Medicine Market researcher, [employer] [facility] at [employer]

## 2019-11-01 NOTE — Addendum Note (Signed)
Addended by: Larey Seat on: 11/01/2019 12:00 PM   Modules accepted: Orders

## 2019-11-05 ENCOUNTER — Telehealth: Payer: Self-pay | Admitting: Neurology

## 2019-11-05 NOTE — Telephone Encounter (Signed)
-----   Message from Larey Seat, MD sent at 11/01/2019 12:00 PM EDT ----- POLYSOMNOGRAPHY IMPRESSION :   1. Severe Obstructive Sleep Apnea (OSA) with a very high AHI of  78.6/h. no REM sleep or supine sleep were recorded. All events  were hypopneas. Hypoxemia was severe, too. Snoring and irregular  heart rate were noted. 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  2. The patient responded well to CPAP at 12 and 123 cm water  pressure, nadir of oxygen improved, sleep efficiency and sleep  architecture normalized. EKG remained abnormal.    RECOMMENDATIONS: Auto titration capable CPAP device with a  setting from 8-16 cm water pressure, 2 cm EPR and heated  humidity.  Post-study, the patient indicated that sleep was much better than  usual.  The patient was fitted with FFM ResMed F 20 in large.

## 2019-11-05 NOTE — Telephone Encounter (Signed)
I called pt. I advised pt that Dr. Brett Fairy reviewed their sleep study results and found that pt has severe slee apnea. Dr. Brett Fairy recommends that pt starts auto CPAP. I reviewed PAP compliance expectations with the pt. Pt is agreeable to starting a CPAP. I advised pt that an order will be sent to a DME, [facility] and [facility]) will call the pt within about one week after they file with the pt's insurance. [facility]) will show the pt how to use the machine, fit for masks, and troubleshoot the CPAP if needed. A follow up appt was made for insurance purposes with Dr. Brett Fairy on Oct 19,2021 at 3:30 pm. Pt verbalized understanding to arrive 15 minutes early and bring their CPAP. A letter with all of this information in it will be mailed to the pt as a reminder. I verified with the pt that the address we have on file is correct. Pt verbalized understanding of results. Pt had no questions at this time but was encouraged to call back if questions arise. I have sent the order to [facility]) and have received confirmation that they have received the order.

## 2019-11-25 ENCOUNTER — Ambulatory Visit: Payer: Medicaid Other | Admitting: Licensed Clinical Social Worker

## 2019-11-25 DIAGNOSIS — I10 Essential (primary) hypertension: Secondary | ICD-10-CM

## 2019-11-25 DIAGNOSIS — I509 Heart failure, unspecified: Secondary | ICD-10-CM

## 2019-11-25 DIAGNOSIS — N1831 Chronic kidney disease, stage 3a: Secondary | ICD-10-CM

## 2019-11-25 NOTE — Patient Instructions (Addendum)
Licensed Clinical Social Worker Visit Information  Goals we discussed today:  .  Client will talk with LCSW in next 30 days to discuss anxiety and stress related to health needs (pt-stated)        Current Barriers:   Mental health challenges if patient with Chronic Diagnoses of HTN, CKD, CHF  Stress/Anxiety over health conditions  Social isolation issues  Clinical Social Work Clinical Goal(s):   LCSW will talk with client in next 30 days to discuss client stress/anxiety regarding health conditions   Interventions:  Talked with [name] about CCM program  Talked with [place] about RNCM support with [place]  Talked with Fleming about sleeping issues of client  Talked with client about social support network (his brother is support)  Talked with client about pain issues of client  Talked with Ilyas about his upcoming appointment at [facility] in [place] (September 20,2021)  Collaborated with [facility] Triage Nurse, [name], regarding current nursing needs of client  Patient Self Care Activities:   Takes medications as prescribed  Patient Self Care Deficits Social isolation issues  Initial goal documentation     Follow Up Plan:LCSW to call client/Mark Melina Copa, brother,in next 4 weeks to talk with client/brotherabout stress/anxiety issuesclientfaces regarding health statusof client  Materials Provided: No  The patient verbalized understanding of instructions provided today and declined a print copy of patient instruction materials.   [name].Jakalyn Kratky MSW, LCSW Licensed Clinical Social Worker [facility] Care [facility] [phone]

## 2019-11-25 NOTE — Chronic Care Management (AMB) (Addendum)
  Chronic Care Management    Clinical Social Work Follow Up Note  11/25/2019 Name: Bradley Torres MRN: [id number] DOB: October 23, 1960  Bradley Torres is a 59 y.o. year old male who is a primary care patient of Bradley Balloon, [name]. The CCM team was consulted for assistance with [name] .   Review of patient status, including review of consultants reports, other relevant assessments, and collaboration with appropriate care team members and the patient's provider was performed as part of comprehensive patient evaluation and provision of chronic care management services.    SDOH (Social Determinants of Health) assessments performed: No;risk for tobacco use; risk for depression ;risk for stress    Chronic Care Management from 05/15/2019 in [place]  PHQ-9 Total Score 6      GAD 7 : Generalized Anxiety Score 05/15/2019 05/13/2019  Nervous, Anxious, on Edge 1 3  Control/stop worrying 1 3  Worry too much - different things 1 3  Trouble relaxing 1 3  Restless 0 2  Easily annoyed or irritable 0 3  Afraid - awful might happen 1 3  Total GAD 7 Score 5 20  Anxiety Difficulty Somewhat difficult Somewhat difficult     Outpatient Encounter Medications as of 11/25/2019  Medication Sig   amLODipine (NORVASC) 5 MG tablet Take 1 tablet (5 mg total) by mouth daily.   DULoxetine (CYMBALTA) 30 MG capsule Take 1 capsule (30 mg total) by mouth daily.   gabapentin (NEURONTIN) 300 MG capsule Take 1 capsule (300 mg total) by mouth 3 (three) times daily.   hydrochlorothiazide (HYDRODIURIL) 25 MG tablet Take 1 tablet (25 mg total) by mouth daily.   metoprolol succinate (TOPROL-XL) 100 MG 24 hr tablet TAKE 1 TABLET BY MOUTH DAILY. take with OR immediately following a meal   rivaroxaban (XARELTO) 20 MG TABS tablet TAKE 1 TABLET BY MOUTH DAILY WITH SUPPER   No facility-administered encounter medications on file as of 11/25/2019.    Goals       Client will talk with LCSW in next  30 days to discuss anxiety and stress related to health needs (pt-stated)      Current Barriers:  Mental health challenges if patient with Chronic Diagnoses of HTN, CKD, CHF Stress/Anxiety over health conditions Social isolation issues  Clinical Social Work Clinical Goal(s):  LCSW will talk with client in next 30 days to discuss client stress/anxiety regarding health conditions   Interventions: Talked with Bradley Torres about CCM program Talked with Bradley Torres about RNCM support with [place] Talked with Bradley Torres about sleeping issues of client Talked with client about social support network (Bradley Torres brother is support) Talked with client about pain issues of client Talked with Bradley Torres about Bradley Torres upcoming appointment at Bradley Torres in [place] (September 20,2021) Collaborated with [facility] Triage Nurse, Bradley Torres, regarding current nursing needs of client  Patient Self Care Activities:  Takes medications as prescribed  Patient Self Care Deficits Social isolation issues  Initial goal documentation      Follow Up Plan: LCSW to call client/Bradley Torres, brother, in next 4 weeks to talk with client/brother about stress/anxiety issues client faces regarding health status of client  Bradley Torres.Bradley Torres MSW, LCSW Licensed Clinical Social Worker [facility] Care Management [phone]  I have reviewed the CCM documentation and agree with the written assessment and plan of care.  Bradley Dun, [name]

## 2019-11-27 ENCOUNTER — Encounter: Payer: Self-pay | Admitting: Family Medicine

## 2019-11-27 ENCOUNTER — Other Ambulatory Visit: Payer: Self-pay

## 2019-11-27 ENCOUNTER — Telehealth: Payer: Self-pay | Admitting: Family

## 2019-11-27 ENCOUNTER — Ambulatory Visit (INDEPENDENT_AMBULATORY_CARE_PROVIDER_SITE_OTHER): Payer: Medicaid Other | Admitting: Family Medicine

## 2019-11-27 VITALS — BP 171/103 | HR 70 | Temp 95.5°F | Ht 73.0 in | Wt >= 6400 oz

## 2019-11-27 DIAGNOSIS — I10 Essential (primary) hypertension: Secondary | ICD-10-CM

## 2019-11-27 DIAGNOSIS — G4730 Sleep apnea, unspecified: Secondary | ICD-10-CM | POA: Diagnosis not present

## 2019-11-27 DIAGNOSIS — M10271 Drug-induced gout, right ankle and foot: Secondary | ICD-10-CM

## 2019-11-27 MED ORDER — METHYLPREDNISOLONE ACETATE 80 MG/ML IJ SUSP
80.0000 mg | Freq: Once | INTRAMUSCULAR | Status: AC
  Administered 2019-11-27: 80 mg via INTRAMUSCULAR

## 2019-11-27 MED ORDER — AMLODIPINE BESYLATE 10 MG PO TABS: 10.0000 mg | ORAL_TABLET | Freq: Every day | ORAL | 2 refills | Status: DC

## 2019-11-27 MED ORDER — PREDNISONE 10 MG (21) PO TBPK: ORAL_TABLET | ORAL | 0 refills | Status: DC

## 2019-11-27 NOTE — Telephone Encounter (Signed)
Pt would like to change his PCP to [facility]. Please advise.

## 2019-11-27 NOTE — Patient Instructions (Addendum)
Stop hydrochlorothiazide.  Start amlodipine 10 mg once daily.  Start steroids tomorrow - you got the shot today.  [facility] [phone] - call them or your neurologist regarding your CPAP    DASH Eating Plan DASH stands for "Dietary Approaches to Stop Hypertension." The DASH eating plan is a healthy eating plan that has been shown to reduce high blood pressure (hypertension). It may also reduce your risk for type 2 diabetes, heart disease, and stroke. The DASH eating plan may also help with weight loss. What are tips for following this plan?  General guidelines  Avoid eating more than 2,300 mg (milligrams) of salt (sodium) a day. If you have hypertension, you may need to reduce your sodium intake to 1,500 mg a day.  Limit alcohol intake to no more than 1 drink a day for nonpregnant women and 2 drinks a day for men. One drink equals 12 oz of beer, 5 oz of wine, or 1 oz of hard liquor.  Work with your health care provider to maintain a healthy body weight or to lose weight. Ask what an ideal weight is for you.  Get at least 30 minutes of exercise that causes your heart to beat faster (aerobic exercise) most days of the week. Activities may include walking, swimming, or biking.  Work with your health care provider or diet and nutrition specialist (dietitian) to adjust your eating plan to your individual calorie needs. Reading food labels   Check food labels for the amount of sodium per serving. Choose foods with less than 5 percent of the Daily Value of sodium. Generally, foods with less than 300 mg of sodium per serving fit into this eating plan.  To find whole grains, look for the word "whole" as the first word in the ingredient list. Shopping  Buy products labeled as "low-sodium" or "no salt added."  Buy fresh foods. Avoid canned foods and premade or frozen meals. Cooking  Avoid adding salt when cooking. Use salt-free seasonings or herbs instead of table salt or sea salt. Check  with your health care provider or pharmacist before using salt substitutes.  Do not fry foods. Cook foods using healthy methods such as baking, boiling, grilling, and broiling instead.  Cook with heart-healthy oils, such as olive, canola, soybean, or sunflower oil. Meal planning  Eat a balanced diet that includes: ? 5 or more servings of fruits and vegetables each day. At each meal, try to fill half of your plate with fruits and vegetables. ? Up to 6-8 servings of whole grains each day. ? Less than 6 oz of lean meat, poultry, or fish each day. A 3-oz serving of meat is about the same size as a deck of cards. One egg equals 1 oz. ? 2 servings of low-fat dairy each day. ? A serving of nuts, seeds, or beans 5 times each week. ? Heart-healthy fats. Healthy fats called Omega-3 fatty acids are found in foods such as flaxseeds and coldwater fish, like sardines, salmon, and mackerel.  Limit how much you eat of the following: ? Canned or prepackaged foods. ? Food that is high in trans fat, such as fried foods. ? Food that is high in saturated fat, such as fatty meat. ? Sweets, desserts, sugary drinks, and other foods with added sugar. ? Full-fat dairy products.  Do not salt foods before eating.  Try to eat at least 2 vegetarian meals each week.  Eat more home-cooked food and less restaurant, buffet, and fast food.  When eating at  a restaurant, ask that your food be prepared with less salt or no salt, if possible. What foods are recommended? The items listed may not be a complete list. Talk with your dietitian about what dietary choices are best for you. Grains Whole-grain or whole-wheat bread. Whole-grain or whole-wheat pasta. Brown rice. [place]. Bulgur. Whole-grain and low-sodium cereals. Pita bread. Low-fat, low-sodium crackers. Whole-wheat flour tortillas. Vegetables Fresh or frozen vegetables (raw, steamed, roasted, or grilled). Low-sodium or reduced-sodium tomato and vegetable  juice. Low-sodium or reduced-sodium tomato sauce and tomato paste. Low-sodium or reduced-sodium canned vegetables. Fruits All fresh, dried, or frozen fruit. Canned fruit in natural juice (without added sugar). Meat and other protein foods Skinless chicken or [place]. Ground chicken or [place]. Pork with fat trimmed off. Fish and seafood. Egg whites. Dried beans, peas, or lentils. Unsalted nuts, nut butters, and seeds. Unsalted canned beans. Lean cuts of beef with fat trimmed off. Low-sodium, lean deli meat. Dairy Low-fat (1%) or fat-free (skim) milk. Fat-free, low-fat, or reduced-fat cheeses. Nonfat, low-sodium ricotta or cottage cheese. Low-fat or nonfat yogurt. Low-fat, low-sodium cheese. Fats and oils Soft margarine without trans fats. Vegetable oil. Low-fat, reduced-fat, or light mayonnaise and salad dressings (reduced-sodium). Canola, safflower, olive, soybean, and sunflower oils. Avocado. Seasoning and other foods Herbs. Spices. Seasoning mixes without salt. Unsalted popcorn and pretzels. Fat-free sweets. What foods are not recommended? The items listed may not be a complete list. Talk with your dietitian about what dietary choices are best for you. Grains Baked goods made with fat, such as croissants, muffins, or some breads. Dry pasta or rice meal packs. Vegetables Creamed or fried vegetables. Vegetables in a cheese sauce. Regular canned vegetables (not low-sodium or reduced-sodium). Regular canned tomato sauce and paste (not low-sodium or reduced-sodium). Regular tomato and vegetable juice (not low-sodium or reduced-sodium). Angie Fava. Olives. Fruits Canned fruit in a light or heavy syrup. Fried fruit. Fruit in cream or butter sauce. Meat and other protein foods Fatty cuts of meat. Ribs. Fried meat. Berniece Salines. Sausage. Bologna and other processed lunch meats. Salami. Fatback. Hotdogs. Bratwurst. Salted nuts and seeds. Canned beans with added salt. Canned or smoked fish. Whole eggs or egg yolks.  Chicken or [place] with skin. Dairy Whole or 2% milk, cream, and half-and-half. Whole or full-fat cream cheese. Whole-fat or sweetened yogurt. Full-fat cheese. Nondairy creamers. Whipped toppings. Processed cheese and cheese spreads. Fats and oils Butter. Stick margarine. Lard. Shortening. Ghee. Bacon fat. Tropical oils, such as coconut, palm kernel, or palm oil. Seasoning and other foods Salted popcorn and pretzels. Onion salt, garlic salt, seasoned salt, table salt, and sea salt. Worcestershire sauce. Tartar sauce. Barbecue sauce. Teriyaki sauce. Soy sauce, including reduced-sodium. Steak sauce. Canned and packaged gravies. Fish sauce. Oyster sauce. Cocktail sauce. Horseradish that you find on the shelf. Ketchup. Mustard. Meat flavorings and tenderizers. Bouillon cubes. Hot sauce and Tabasco sauce. Premade or packaged marinades. Premade or packaged taco seasonings. Relishes. Regular salad dressings. Where to find more information:  National Heart, Lung, and [place]: https://wilson-eaton.com/  American Heart Association: www.heart.org Summary  The DASH eating plan is a healthy eating plan that has been shown to reduce high blood pressure (hypertension). It may also reduce your risk for type 2 diabetes, heart disease, and stroke.  With the DASH eating plan, you should limit salt (sodium) intake to 2,300 mg a day. If you have hypertension, you may need to reduce your sodium intake to 1,500 mg a day.  When on the DASH eating plan, aim to eat  more fresh fruits and vegetables, whole grains, lean proteins, low-fat dairy, and heart-healthy fats.  Work with your health care provider or diet and nutrition specialist (dietitian) to adjust your eating plan to your individual calorie needs. This information is not intended to replace advice given to you by your health care provider. Make sure you discuss any questions you have with your health care provider. Document Revised: 02/24/2017 Document Reviewed:  03/07/2016 Elsevier Patient Education  2020 [employer].

## 2019-11-27 NOTE — Telephone Encounter (Signed)
I am fine with this if C. Lenna Gilford is.

## 2019-11-27 NOTE — Progress Notes (Signed)
Assessment & Plan:  1. Acute drug-induced gout involving toe of right foot - Discussed new onset of gout could be related to starting HCTZ, so this medication was D/C'd. He was given a shot of steroids in office today and advised to start oral prednisone tomorrow.  - predniSONE (STERAPRED UNI-PAK 21 TAB) 10 MG (21) TBPK tablet; As directed x 6 days  Dispense: 21 tablet; Refill: 0 - methylPREDNISolone acetate (DEPO-MEDROL) injection 80 mg  2. Benign essential HTN - Uncontrolled. HCTZ D/C'd due to new onset of gout flares. Amlodipine increased from 5 mg to 10 mg. May need to decrease metoprolol back down to 50 mg due to increased fatigue and just feeling blah. Patient does have Losartan listed as an allergy that caused a mild rash, but states he tolerated Valsartan after that medication. He stopped taking it due to a recall. We can potentially use Benicar going forward if needed. We will avoid ACE-inhibitors due to wheezing. He does not check his BP at home. Education provided on the DASH diet. Encouraged diet and exercise.  - amLODipine (NORVASC) 10 MG tablet; Take 1 tablet (10 mg total) by mouth daily.  Dispense: 30 tablet; Refill: 2  3. Severe sleep apnea - Advised to call neurologist that completed sleep study and ordered supplies to inquire on the supplies. I did also give him the # for [facility] as it appears this is where they sent the orders for his supplies.    Follow up plan: Return in about 2 weeks (around 12/11/2019) for HTN.  Hendricks Limes, MSN, APRN, [name]-C [facility]  Subjective:   Patient ID: Bradley Torres, male    DOB: [birth year], 59 y.o.   MRN: [id number]  HPI: Bradley Torres is a 59 y.o. male presenting on 11/27/2019 for Foot Swelling (x 5 days- right foot)  Patient reports pain and swelling of the right foot x5 days. The pain started in the top of his foot and is now down around his toes. He reports he keeps having gout flares and feels very fatigued  since his medications were changed. These changes include an increase in metoprolol from 50 mg to 100 mg and starting HCTZ 25 mg once daily.    Patient is also wondering about his CPAP supplies after his sleep study showed severe sleep apnea.    ROS: Negative unless specifically indicated above in HPI.   Relevant past medical history reviewed and updated as indicated.   Allergies and medications reviewed and updated.   Current Outpatient Medications:  .  hydrochlorothiazide (HYDRODIURIL) 25 MG tablet, Take 1 tablet (25 mg total) by mouth daily., Disp: 90 tablet, Rfl: 4 .  metoprolol succinate (TOPROL-XL) 100 MG 24 hr tablet, TAKE 1 TABLET BY MOUTH DAILY. take with OR immediately following a meal, Disp: 30 tablet, Rfl: 1 .  rivaroxaban (XARELTO) 20 MG TABS tablet, TAKE 1 TABLET BY MOUTH DAILY WITH SUPPER, Disp: 30 tablet, Rfl: 11 .  DULoxetine (CYMBALTA) 30 MG capsule, Take 1 capsule (30 mg total) by mouth daily., Disp: 30 capsule, Rfl: 1  Allergies  Allergen Reactions  . Ibuprofen Other (See Comments)    Rectal bleed  . Lisinopril     wheezing  . Diflucan [Fluconazole] Rash  . Losartan Rash    Objective:   BP (!) 171/103   Pulse 70   Temp (!) 95.5 F (35.3 C) (Temporal)   Ht 6\' 1"  (1.854 m)   Wt (!) 401 lb (181.9 kg)   SpO2  90%   BMI 52.91 kg/m    Physical Exam Vitals reviewed.  Constitutional:      General: He is not in acute distress.    Appearance: Normal appearance. He is morbidly obese. He is not ill-appearing, toxic-appearing or diaphoretic.  HENT:     Head: Normocephalic and atraumatic.  Eyes:     General: No scleral icterus.       Right eye: No discharge.        Left eye: No discharge.     Conjunctiva/sclera: Conjunctivae normal.  Cardiovascular:     Rate and Rhythm: Normal rate and regular rhythm.     Heart sounds: Normal heart sounds. No murmur heard.  No friction rub. No gallop.   Pulmonary:     Effort: Pulmonary effort is normal. No respiratory  distress.     Breath sounds: Normal breath sounds. No stridor. No wheezing, rhonchi or rales.  Musculoskeletal:        General: Normal range of motion.     Cervical back: Normal range of motion.  Feet:     Comments: Pain to first three toes on right foot with mild erythema.  Skin:    General: Skin is warm and dry.  Neurological:     Mental Status: He is alert and oriented to person, place, and time. Mental status is at baseline.  Psychiatric:        Mood and Affect: Mood normal.        Behavior: Behavior normal.        Thought Content: Thought content normal.        Judgment: Judgment normal.

## 2019-11-28 NOTE — Telephone Encounter (Signed)
This is fine with me   

## 2019-11-28 NOTE — Telephone Encounter (Signed)
Left message that PCP change was approved and to call back with any questions or concerns.

## 2019-12-04 NOTE — Telephone Encounter (Signed)
Pt has called to report that that he has not been contacted by anyone re: his CPAP.  Pt has been given the# to [facility] but would also like a call from RN

## 2019-12-05 NOTE — Telephone Encounter (Signed)
Called the patient back and he states that he was told by [facility] that a form was waiting to be completed by our office. Advised that I spoke with their manager earlier and she states they have what they need. Patient is not scheduled yet for visit to get scheduled.  I have advised patient I will reach back out to them.

## 2019-12-05 NOTE — Telephone Encounter (Signed)
I have reached out to [facility] to check on the status.  There response "She sent the [facility] [employer] [facility] CMN to [place] and we got that back early last week from [place].   I have Caryl Pina reaching out to him today and getting him scheduled"  They have to have the CMN signed in order to get approval.

## 2019-12-12 ENCOUNTER — Ambulatory Visit: Payer: Medicaid Other | Admitting: Family Medicine

## 2019-12-12 ENCOUNTER — Encounter: Payer: Self-pay | Admitting: Family Medicine

## 2019-12-12 ENCOUNTER — Other Ambulatory Visit: Payer: Self-pay

## 2019-12-12 VITALS — BP 168/106 | HR 80 | Temp 97.6°F | Ht 73.0 in | Wt >= 6400 oz

## 2019-12-12 DIAGNOSIS — I10 Essential (primary) hypertension: Secondary | ICD-10-CM

## 2019-12-12 DIAGNOSIS — Z23 Encounter for immunization: Secondary | ICD-10-CM

## 2019-12-12 MED ORDER — SPIRONOLACTONE 25 MG PO TABS: 25.0000 mg | ORAL_TABLET | Freq: Every day | ORAL | 2 refills | Status: DC

## 2019-12-12 NOTE — Patient Instructions (Signed)
DASH Eating Plan DASH stands for "Dietary Approaches to Stop Hypertension." The DASH eating plan is a healthy eating plan that has been shown to reduce high blood pressure (hypertension). It may also reduce your risk for type 2 diabetes, heart disease, and stroke. The DASH eating plan may also help with weight loss. What are tips for following this plan?  General guidelines  Avoid eating more than 2,300 mg (milligrams) of salt (sodium) a day. If you have hypertension, you may need to reduce your sodium intake to 1,500 mg a day.  Limit alcohol intake to no more than 1 drink a day for nonpregnant women and 2 drinks a day for men. One drink equals 12 oz of beer, 5 oz of wine, or 1 oz of hard liquor.  Work with your health care provider to maintain a healthy body weight or to lose weight. Ask what an ideal weight is for you.  Get at least 30 minutes of exercise that causes your heart to beat faster (aerobic exercise) most days of the week. Activities may include walking, swimming, or biking.  Work with your health care provider or diet and nutrition specialist (dietitian) to adjust your eating plan to your individual calorie needs. Reading food labels   Check food labels for the amount of sodium per serving. Choose foods with less than 5 percent of the Daily Value of sodium. Generally, foods with less than 300 mg of sodium per serving fit into this eating plan.  To find whole grains, look for the word "whole" as the first word in the ingredient list. Shopping  Buy products labeled as "low-sodium" or "no salt added."  Buy fresh foods. Avoid canned foods and premade or frozen meals. Cooking  Avoid adding salt when cooking. Use salt-free seasonings or herbs instead of table salt or sea salt. Check with your health care provider or pharmacist before using salt substitutes.  Do not fry foods. Cook foods using healthy methods such as baking, boiling, grilling, and broiling instead.  Cook with  heart-healthy oils, such as olive, canola, soybean, or sunflower oil. Meal planning  Eat a balanced diet that includes: ? 5 or more servings of fruits and vegetables each day. At each meal, try to fill half of your plate with fruits and vegetables. ? Up to 6-8 servings of whole grains each day. ? Less than 6 oz of lean meat, poultry, or fish each day. A 3-oz serving of meat is about the same size as a deck of cards. One egg equals 1 oz. ? 2 servings of low-fat dairy each day. ? A serving of nuts, seeds, or beans 5 times each week. ? Heart-healthy fats. Healthy fats called Omega-3 fatty acids are found in foods such as flaxseeds and coldwater fish, like sardines, salmon, and mackerel.  Limit how much you eat of the following: ? Canned or prepackaged foods. ? Food that is high in trans fat, such as fried foods. ? Food that is high in saturated fat, such as fatty meat. ? Sweets, desserts, sugary drinks, and other foods with added sugar. ? Full-fat dairy products.  Do not salt foods before eating.  Try to eat at least 2 vegetarian meals each week.  Eat more home-cooked food and less restaurant, buffet, and fast food.  When eating at a restaurant, ask that your food be prepared with less salt or no salt, if possible. What foods are recommended? The items listed may not be a complete list. Talk with your dietitian about   what dietary choices are best for you. Grains Whole-grain or whole-wheat bread. Whole-grain or whole-wheat pasta. Brown rice. Oatmeal. Quinoa. Bulgur. Whole-grain and low-sodium cereals. Pita bread. Low-fat, low-sodium crackers. Whole-wheat flour tortillas. Vegetables Fresh or frozen vegetables (raw, steamed, roasted, or grilled). Low-sodium or reduced-sodium tomato and vegetable juice. Low-sodium or reduced-sodium tomato sauce and tomato paste. Low-sodium or reduced-sodium canned vegetables. Fruits All fresh, dried, or frozen fruit. Canned fruit in natural juice (without  added sugar). Meat and other protein foods Skinless chicken or turkey. Ground chicken or turkey. Pork with fat trimmed off. Fish and seafood. Egg whites. Dried beans, peas, or lentils. Unsalted nuts, nut butters, and seeds. Unsalted canned beans. Lean cuts of beef with fat trimmed off. Low-sodium, lean deli meat. Dairy Low-fat (1%) or fat-free (skim) milk. Fat-free, low-fat, or reduced-fat cheeses. Nonfat, low-sodium ricotta or cottage cheese. Low-fat or nonfat yogurt. Low-fat, low-sodium cheese. Fats and oils Soft margarine without trans fats. Vegetable oil. Low-fat, reduced-fat, or light mayonnaise and salad dressings (reduced-sodium). Canola, safflower, olive, soybean, and sunflower oils. Avocado. Seasoning and other foods Herbs. Spices. Seasoning mixes without salt. Unsalted popcorn and pretzels. Fat-free sweets. What foods are not recommended? The items listed may not be a complete list. Talk with your dietitian about what dietary choices are best for you. Grains Baked goods made with fat, such as croissants, muffins, or some breads. Dry pasta or rice meal packs. Vegetables Creamed or fried vegetables. Vegetables in a cheese sauce. Regular canned vegetables (not low-sodium or reduced-sodium). Regular canned tomato sauce and paste (not low-sodium or reduced-sodium). Regular tomato and vegetable juice (not low-sodium or reduced-sodium). Pickles. Olives. Fruits Canned fruit in a light or heavy syrup. Fried fruit. Fruit in cream or butter sauce. Meat and other protein foods Fatty cuts of meat. Ribs. Fried meat. Bacon. Sausage. Bologna and other processed lunch meats. Salami. Fatback. Hotdogs. Bratwurst. Salted nuts and seeds. Canned beans with added salt. Canned or smoked fish. Whole eggs or egg yolks. Chicken or turkey with skin. Dairy Whole or 2% milk, cream, and half-and-half. Whole or full-fat cream cheese. Whole-fat or sweetened yogurt. Full-fat cheese. Nondairy creamers. Whipped toppings.  Processed cheese and cheese spreads. Fats and oils Butter. Stick margarine. Lard. Shortening. Ghee. Bacon fat. Tropical oils, such as coconut, palm kernel, or palm oil. Seasoning and other foods Salted popcorn and pretzels. Onion salt, garlic salt, seasoned salt, table salt, and sea salt. Worcestershire sauce. Tartar sauce. Barbecue sauce. Teriyaki sauce. Soy sauce, including reduced-sodium. Steak sauce. Canned and packaged gravies. Fish sauce. Oyster sauce. Cocktail sauce. Horseradish that you find on the shelf. Ketchup. Mustard. Meat flavorings and tenderizers. Bouillon cubes. Hot sauce and Tabasco sauce. Premade or packaged marinades. Premade or packaged taco seasonings. Relishes. Regular salad dressings. Where to find more information:  National Heart, Lung, and Blood Institute: www.nhlbi.nih.gov  American Heart Association: www.heart.org Summary  The DASH eating plan is a healthy eating plan that has been shown to reduce high blood pressure (hypertension). It may also reduce your risk for type 2 diabetes, heart disease, and stroke.  With the DASH eating plan, you should limit salt (sodium) intake to 2,300 mg a day. If you have hypertension, you may need to reduce your sodium intake to 1,500 mg a day.  When on the DASH eating plan, aim to eat more fresh fruits and vegetables, whole grains, lean proteins, low-fat dairy, and heart-healthy fats.  Work with your health care provider or diet and nutrition specialist (dietitian) to adjust your eating plan to your   individual calorie needs. This information is not intended to replace advice given to you by your health care provider. Make sure you discuss any questions you have with your health care provider. Document Revised: 02/24/2017 Document Reviewed: 03/07/2016 Elsevier Patient Education  2020 Elsevier Inc.  

## 2019-12-12 NOTE — Progress Notes (Signed)
Assessment & Plan:  1. Benign essential HTN - Uncontrolled.  Patient is unable to tolerate thiazide diuretics, calcium channel blockers, ARB, or ACE inhibitors.  Recommended hydralazine but patient is unwilling to take the medication he has to take more than once a day.  Rx to spironolactone.  Discussed that he may need to go see a cardiologist if I am unable to get his blood pressure under control.  Education provided on the DASH diet.  Encourage diet and exercise. - spironolactone (ALDACTONE) 25 MG tablet; Take 1 tablet (25 mg total) by mouth daily.  Dispense: 30 tablet; Refill: 2  2. Need for immunization against influenza - Flu Vaccine QUAD 36+ mos IM   Return in about 2 weeks (around 12/26/2019) for HTN.  Bradley Limes, MSN, APRN, [name]-C [facility]  Subjective:    Patient ID: Bradley Torres, male    DOB: [birth year], 59 y.o.   MRN: [id number]  Patient Care Team: [name], [name] as PCP - General (Family Medicine) Herminio Commons, MD (Inactive) as PCP - Cardiology (Cardiology) Shea Evans [name], LCSW as Social Worker (Licensed Clinical Social Worker)   Chief Complaint:  Chief Complaint  Patient presents with   Hypertension    2 wk rck    HPI: Bradley Torres is a 59 y.o. male presenting on 12/12/2019 for Hypertension (2 wk rck)  Patient is here for a follow-up of hypertension.  At his last visit HCTZ was discontinued due to causing gout flares.  He has had no gout flares in the past 2 weeks since stopping the medication.  He has an allergy to losartan that caused a mild rash.  At our last visit he stated that he tolerated the valsartan, but today he states it made him feel bad so he was glad when it got recalled.  Patient is a side effect of wheezing ACE inhibitors.  Patient reports today he is unable to tolerate amlodipine because it resulted in a rash and extreme leg itching and pain.  He reports he felt like he was being beat in the legs with  a sledgehammer and like his legs were going to break every time he tried to walk.  New complaints: None  Social history:  Relevant past medical, surgical, family and social history reviewed and updated as indicated. Interim medical history since our last visit reviewed.  Allergies and medications reviewed and updated.  DATA REVIEWED: CHART IN EPIC  ROS: Negative unless specifically indicated above in HPI.    Current Outpatient Medications:    metoprolol succinate (TOPROL-XL) 100 MG 24 hr tablet, TAKE 1 TABLET BY MOUTH DAILY. take with OR immediately following a meal, Disp: 30 tablet, Rfl: 1   rivaroxaban (XARELTO) 20 MG TABS tablet, TAKE 1 TABLET BY MOUTH DAILY WITH SUPPER, Disp: 30 tablet, Rfl: 11   amLODipine (NORVASC) 10 MG tablet, Take 1 tablet (10 mg total) by mouth daily. (Patient not taking: Reported on 12/12/2019), Disp: 30 tablet, Rfl: 2   DULoxetine (CYMBALTA) 30 MG capsule, Take 1 capsule (30 mg total) by mouth daily. (Patient not taking: Reported on 12/12/2019), Disp: 30 capsule, Rfl: 1   Allergies  Allergen Reactions   Ibuprofen Other (See Comments)    Rectal bleed   Lisinopril     wheezing   Diflucan [Fluconazole] Rash   Losartan Rash   Past Medical History:  Diagnosis Date   CAD (coronary artery disease)    a. 12/2017: cath showing 10% Proximal-LAD stenosis with no  significant obstructive disease and LVEDP mildly elevated at 18 mm Hg.    Chronic back pain    DVT (deep venous thrombosis) (HCC)    Essential hypertension    Morbid obesity (HCC)    Panic disorder    Prediabetes    Shoulder pain, left    Following fall    Past Surgical History:  Procedure Laterality Date   Ankle fracture sugery Left    COLONOSCOPY WITH PROPOFOL N/A 05/29/2017   Dr. [name]: Multiple tubular adenomas removed, prep inadequate.  Short interval surveillance colonoscopy recommended in 6 months   LEFT HEART CATH AND CORONARY ANGIOGRAPHY N/A 01/23/2018   Procedure:  LEFT HEART CATH AND CORONARY ANGIOGRAPHY;  Surgeon: Troy Sine, MD;  Location: [facility];  Service: Cardiovascular;  Laterality: N/A;   LUMBAR SPINE SURGERY     fusion   POLYPECTOMY  05/29/2017   Procedure: POLYPECTOMY;  Surgeon: Daneil Dolin, MD;  Location: [facility];  Service: Endoscopy;;  ascending colon polyp x5-cs transverse colon polyp x4- cs descending colon polyp x1- hs sigmoid colon polyp x1 -hs rectal polyp x1- hs   TONSILLECTOMY      Social History   Socioeconomic History   Marital status: Single    Spouse name: Not on file   Number of children: Not on file   Years of education: Not on file   Highest education level: Not on file  Occupational History   Not on file  Tobacco Use   Smoking status: Never Smoker   Smokeless tobacco: Never Used  Vaping Use   Vaping Use: Never used  Substance and Sexual Activity   Alcohol use: Yes    Comment: occ   Drug use: Not Currently    Types: Marijuana    Comment: daily; 04/19/19 none past few weeks d/t problems breathing   Sexual activity: Not Currently    Birth control/protection: None  Other Topics Concern   Not on file  Social History Narrative   Not on file   Social Determinants of Health   Financial Resource Strain:    Difficulty of Paying Living Expenses: Not on file  Food Insecurity:    Worried About Estate manager/land agent of Food in the Last Year: Not on file   [employer] of Food in the Last Year: Not on file  Transportation Needs:    Lack of Transportation (Medical): Not on file   Lack of Transportation (Non-Medical): Not on file  Physical Activity:    Days of Exercise per Week: Not on file   Minutes of Exercise per Session: Not on file  Stress: Stress Concern Present   Feeling of Stress : Rather much  Social Connections:    Frequency of Communication with Friends and Family: Not on file   Frequency of Social Gatherings with Friends and Family: Not on file   Attends Religious  Services: Not on file   Active Member of Clubs or Organizations: Not on file   Attends Archivist Meetings: Not on file   Marital Status: Not on file  Intimate Partner Violence:    Fear of Current or Ex-Partner: Not on file   Emotionally Abused: Not on file   Physically Abused: Not on file   Sexually Abused: Not on file        Objective:    BP (!) 168/106    Pulse 80    Temp 97.6 F (36.4 C) (Temporal)    Ht 6\' 1"  (1.854 m)  Wt (!) 402 lb 3.2 oz (182.4 kg)    BMI 53.06 kg/m   Wt Readings from Last 3 Encounters:  12/12/19 (!) 402 lb 3.2 oz (182.4 kg)  11/27/19 (!) 401 lb (181.9 kg)  08/01/19 (!) 408 lb (185.1 kg)    Physical Exam Vitals reviewed.  Constitutional:      General: He is not in acute distress.    Appearance: Normal appearance. He is morbidly obese. He is not ill-appearing, toxic-appearing or diaphoretic.  HENT:     Head: Normocephalic and atraumatic.  Eyes:     General: No scleral icterus.       Right eye: No discharge.        Left eye: No discharge.     Conjunctiva/sclera: Conjunctivae normal.  Cardiovascular:     Rate and Rhythm: Normal rate and regular rhythm.     Heart sounds: Normal heart sounds. No murmur heard.  No friction rub. No gallop.   Pulmonary:     Effort: Pulmonary effort is normal. No respiratory distress.     Breath sounds: Normal breath sounds. No stridor. No wheezing, rhonchi or rales.  Musculoskeletal:        General: Normal range of motion.     Cervical back: Normal range of motion.  Skin:    General: Skin is warm and dry.  Neurological:     Mental Status: He is alert and oriented to person, place, and time. Mental status is at baseline.  Psychiatric:        Mood and Affect: Mood normal.        Behavior: Behavior normal.        Thought Content: Thought content normal.        Judgment: Judgment normal.     Lab Results  Component Value Date   TSH 2.840 05/13/2019   Lab Results  Component Value Date   WBC  7.3 04/10/2019   HGB 14.4 04/10/2019   HCT 42.2 04/10/2019   MCV 97.5 04/10/2019   PLT 281 04/10/2019   Lab Results  Component Value Date   NA 138 04/10/2019   K 3.4 (L) 04/10/2019   CO2 30 04/10/2019   GLUCOSE 141 (H) 04/10/2019   BUN 15 04/10/2019   CREATININE 1.22 04/10/2019   BILITOT 0.7 04/10/2019   ALKPHOS 58 04/10/2019   AST 23 04/10/2019   ALT 26 04/10/2019   PROT 7.3 04/10/2019   ALBUMIN 3.7 04/10/2019   CALCIUM 9.0 04/10/2019   ANIONGAP 10 04/10/2019   Lab Results  Component Value Date   CHOL 183 01/07/2019   Lab Results  Component Value Date   HDL 39 (L) 01/07/2019   Lab Results  Component Value Date   LDLCALC 123 (H) 01/07/2019   Lab Results  Component Value Date   TRIG 114 01/07/2019   Lab Results  Component Value Date   CHOLHDL 4.7 01/07/2019   Lab Results  Component Value Date   HGBA1C 6.2 (H) 01/20/2018

## 2019-12-15 ENCOUNTER — Encounter: Payer: Self-pay | Admitting: Family Medicine

## 2019-12-16 DIAGNOSIS — Z1159 Encounter for screening for other viral diseases: Secondary | ICD-10-CM | POA: Diagnosis not present

## 2019-12-16 DIAGNOSIS — E559 Vitamin D deficiency, unspecified: Secondary | ICD-10-CM | POA: Diagnosis not present

## 2019-12-16 DIAGNOSIS — Z20822 Contact with and (suspected) exposure to covid-19: Secondary | ICD-10-CM | POA: Diagnosis not present

## 2019-12-16 DIAGNOSIS — Z79899 Other long term (current) drug therapy: Secondary | ICD-10-CM | POA: Diagnosis not present

## 2019-12-16 DIAGNOSIS — M129 Arthropathy, unspecified: Secondary | ICD-10-CM | POA: Diagnosis not present

## 2019-12-16 DIAGNOSIS — G4733 Obstructive sleep apnea (adult) (pediatric): Secondary | ICD-10-CM | POA: Diagnosis not present

## 2019-12-16 LAB — BASIC METABOLIC PANEL
BUN: 21 (ref 4–21)
CO2: 27 — AB (ref 13–22)
Chloride: 104 (ref 99–108)
Creatinine: 1.3 (ref 0.6–1.3)
Glucose: 236
Potassium: 4.6 (ref 3.4–5.3)
Sodium: 139 (ref 137–147)

## 2019-12-16 LAB — COMPREHENSIVE METABOLIC PANEL
Albumin: 3.5 (ref 3.5–5.0)
Calcium: 9 (ref 8.7–10.7)
GFR calc Af Amer: 68
GFR calc non Af Amer: 56
Globulin: 3

## 2019-12-16 LAB — HM HEPATITIS C SCREENING LAB: HM Hepatitis Screen: NEGATIVE

## 2019-12-16 LAB — CBC AND DIFFERENTIAL
HCT: 42 (ref 41–53)
Hemoglobin: 13.2 — AB (ref 13.5–17.5)
Platelets: 272 (ref 150–399)
WBC: 10.2

## 2019-12-16 LAB — HEPATIC FUNCTION PANEL
ALT: 19 (ref 10–40)
AST: 18 (ref 14–40)
Alkaline Phosphatase: 75 (ref 25–125)
Bilirubin, Total: 0.4

## 2019-12-16 LAB — VITAMIN D 25 HYDROXY (VIT D DEFICIENCY, FRACTURES): Vit D, 25-Hydroxy: 4.5

## 2019-12-16 LAB — CBC: RBC: 4.1 (ref 3.87–5.11)

## 2019-12-17 ENCOUNTER — Telehealth: Payer: Self-pay | Admitting: Family Medicine

## 2019-12-17 NOTE — Telephone Encounter (Signed)
Pt called to let Britney know that he has to stop taking the new BP medicine that she put him on because it is not working. Pts BP is still running high and says that it is also causing his legs to break out and feels like legs are hot.

## 2019-12-18 NOTE — Telephone Encounter (Signed)
Pt states he sees Dr Prudy Feeler in [place] so he will call to schedule a follow up appt with them.

## 2019-12-18 NOTE — Telephone Encounter (Signed)
I am recommending a referral to cardiology for management. Where would he like to go?

## 2019-12-18 NOTE — Telephone Encounter (Signed)
Pt states he started taking the Spironolactone on 12/13/19 and stopped yesterday as his legs have a rash and they are very itchy like it was with the Amlodipine. Pt states he doesn't know what his BP readings are as he doesn't take them. Is there something else we can send in or I saw in your notes you had mentioned Cardiology referral.

## 2019-12-23 ENCOUNTER — Other Ambulatory Visit: Payer: Self-pay | Admitting: Family

## 2019-12-23 DIAGNOSIS — I10 Essential (primary) hypertension: Secondary | ICD-10-CM

## 2019-12-23 DIAGNOSIS — Z79899 Other long term (current) drug therapy: Secondary | ICD-10-CM | POA: Diagnosis not present

## 2019-12-23 DIAGNOSIS — I509 Heart failure, unspecified: Secondary | ICD-10-CM

## 2019-12-23 DIAGNOSIS — M539 Dorsopathy, unspecified: Secondary | ICD-10-CM | POA: Diagnosis not present

## 2019-12-25 ENCOUNTER — Encounter: Payer: Self-pay | Admitting: Family Medicine

## 2019-12-25 ENCOUNTER — Other Ambulatory Visit: Payer: Self-pay

## 2019-12-25 ENCOUNTER — Ambulatory Visit (INDEPENDENT_AMBULATORY_CARE_PROVIDER_SITE_OTHER): Payer: Medicaid Other | Admitting: Family Medicine

## 2019-12-25 VITALS — BP 187/105 | HR 66 | Temp 97.8°F | Ht 73.0 in | Wt >= 6400 oz

## 2019-12-25 DIAGNOSIS — I160 Hypertensive urgency: Secondary | ICD-10-CM | POA: Diagnosis not present

## 2019-12-25 DIAGNOSIS — R7303 Prediabetes: Secondary | ICD-10-CM

## 2019-12-25 DIAGNOSIS — E559 Vitamin D deficiency, unspecified: Secondary | ICD-10-CM

## 2019-12-25 LAB — BAYER DCA HB A1C WAIVED: HB A1C (BAYER DCA - WAIVED): 7.7 % — ABNORMAL HIGH (ref <7.0)

## 2019-12-25 NOTE — Progress Notes (Signed)
Assessment & Plan:  1. Hypertensive urgency - Uncontrolled. Highly advised hydralazine but patient declines as he does not want to take anything more than once daily. Patient advised if he was not going to take a new medication from me, that he needs to go to the ER due to his BP. He is not agreeable to this either and states he will just follow-up with cardiology on 12/31/2019. He understands his risk of having a heart attack, stroke, or death due to uncontrolled BP.   2. Prediabetes - Bayer DCA Hb A1c Waived  3. Vitamin D deficiency - Patient started taking a supplement after lab work on 12/16/2019, although he does not know the dosage.    Return in about 6 weeks (around 02/05/2020) for follow-up of chronic medication conditions.  Bradley Limes, MSN, APRN, [name]-C [facility]  Subjective:    Patient ID: Bradley Torres, male    DOB: September 18, 1960, 59 y.o.   MRN: [id number]  Patient Care Team: [name], [name] as PCP - General (Family Medicine) Herminio Commons, MD (Inactive) as PCP - Cardiology (Cardiology) Shea Evans [name], LCSW as Social Worker (Licensed Clinical Social Worker)   Chief Complaint:  Chief Complaint  Patient presents with  . Hypertension    2 week follow up    HPI: Bradley Torres is a 59 y.o. male presenting on 12/25/2019 for Hypertension (2 week follow up)  Patient is here for a 2-week follow-up of hypertension.  At his last visit he was prescribed spironolactone which he took for 3 days before stopping.  He reports it made his legs feel like they were hot and breaking out.  When patient called to let us know this he was told that he needed to follow-up with cardiology for further management of his blood pressure.  He has an appointment scheduled on 12/31/2019.  New complaints: None - patient did bring lab work completed recently with his pain management provider.    Social history:  Relevant past medical, surgical, family and  social history reviewed and updated as indicated. Interim medical history since our last visit reviewed.  Allergies and medications reviewed and updated.  DATA REVIEWED: CHART IN EPIC  ROS: Negative unless specifically indicated above in HPI.    Current Outpatient Medications:  .  metoprolol succinate (TOPROL-XL) 100 MG 24 hr tablet, TAKE 1 TABLET BY MOUTH DAILY. take with OR immediately following a meal, Disp: 30 tablet, Rfl: 0 .  rivaroxaban (XARELTO) 20 MG TABS tablet, TAKE 1 TABLET BY MOUTH DAILY WITH SUPPER, Disp: 30 tablet, Rfl: 11   Allergies  Allergen Reactions  . Lisinopril Other (See Comments)    wheezing  . Ibuprofen Other (See Comments)    Rectal bleed  . Spironolactone Hives  . Amlodipine Itching and Other (See Comments)    Leg pain.  . Diflucan [Fluconazole] Rash  . Hctz [Hydrochlorothiazide] Other (See Comments)    Caused gout flares.  . Losartan Rash   Past Medical History:  Diagnosis Date  . CAD (coronary artery disease)    a. 12/2017: cath showing 10% Proximal-LAD stenosis with no significant obstructive disease and LVEDP mildly elevated at 18 mm Hg.   Marland Kitchen Chronic back pain   . DVT (deep venous thrombosis) ([place])   . Essential hypertension   . Morbid obesity ([place])   . Panic disorder   . Prediabetes   . Shoulder pain, left    Following fall    Past Surgical History:  Procedure  Laterality Date  . Ankle fracture sugery Left   . COLONOSCOPY WITH PROPOFOL N/A 05/29/2017   Dr. [name]: Multiple tubular adenomas removed, prep inadequate.  Short interval surveillance colonoscopy recommended in 6 months  . LEFT HEART CATH AND CORONARY ANGIOGRAPHY N/A 01/23/2018   Procedure: LEFT HEART CATH AND CORONARY ANGIOGRAPHY;  Surgeon: Troy Sine, MD;  Location: [facility] [place] [facility];  Service: Cardiovascular;  Laterality: N/A;  . LUMBAR SPINE SURGERY     fusion  . POLYPECTOMY  05/29/2017   Procedure: POLYPECTOMY;  Surgeon: Daneil Dolin, MD;  Location: [facility];   Service: Endoscopy;;  ascending colon polyp x5-cs transverse colon polyp x4- cs descending colon polyp x1- hs sigmoid colon polyp x1 -hs rectal polyp x1- hs  . TONSILLECTOMY      Social History   Socioeconomic History  . Marital status: Single    Spouse name: Not on file  . Number of children: Not on file  . Years of education: Not on file  . Highest education level: Not on file  Occupational History  . Not on file  Tobacco Use  . Smoking status: Never Smoker  . Smokeless tobacco: Never Used  Vaping Use  . Vaping Use: Never used  Substance and Sexual Activity  . Alcohol use: Yes    Comment: occ  . Drug use: Not Currently    Types: Marijuana    Comment: daily; 04/19/19 none past few weeks d/t problems breathing  . Sexual activity: Not Currently    Birth control/protection: None  Other Topics Concern  . Not on file  Social History Narrative  . Not on file   Social Determinants of Health   Financial Resource Strain:   . Difficulty of Paying Living Expenses: Not on file  Food Insecurity:   . Worried About Charity fundraiser in the Last Year: Not on file  . Ran Out of Food in the Last Year: Not on file  Transportation Needs:   . Lack of Transportation (Medical): Not on file  . Lack of Transportation (Non-Medical): Not on file  Physical Activity:   . Days of Exercise per Week: Not on file  . Minutes of Exercise per Session: Not on file  Stress: Stress Concern Present  . Feeling of Stress : Rather much  Social Connections:   . Frequency of Communication with Friends and Family: Not on file  . Frequency of Social Gatherings with Friends and Family: Not on file  . Attends Religious Services: Not on file  . Active Member of Clubs or Organizations: Not on file  . Attends Archivist Meetings: Not on file  . Marital Status: Not on file  Intimate Partner Violence:   . Fear of Current or Ex-Partner: Not on file  . Emotionally Abused: Not on file  . Physically  Abused: Not on file  . Sexually Abused: Not on file        Objective:    BP (!) 187/105   Pulse 66   Temp 97.8 F (36.6 C) (Temporal)   Ht 6\' 1"  (1.854 m)   Wt (!) 408 lb (185.1 kg)   SpO2 92%   BMI 53.83 kg/m   Wt Readings from Last 3 Encounters:  12/25/19 (!) 408 lb (185.1 kg)  12/12/19 (!) 402 lb 3.2 oz (182.4 kg)  11/27/19 (!) 401 lb (181.9 kg)    Physical Exam Vitals reviewed.  Constitutional:      General: He is not in acute distress.  Appearance: Normal appearance. He is morbidly obese. He is not ill-appearing, toxic-appearing or diaphoretic.  HENT:     Head: Normocephalic and atraumatic.  Eyes:     General: No scleral icterus.       Right eye: No discharge.        Left eye: No discharge.     Conjunctiva/sclera: Conjunctivae normal.  Cardiovascular:     Rate and Rhythm: Normal rate and regular rhythm.     Heart sounds: Normal heart sounds. No murmur heard.  No friction rub. No gallop.   Pulmonary:     Effort: Pulmonary effort is normal. No respiratory distress.     Breath sounds: Normal breath sounds. No stridor. No wheezing, rhonchi or rales.  Musculoskeletal:        General: Normal range of motion.     Cervical back: Normal range of motion.  Skin:    General: Skin is warm and dry.  Neurological:     Mental Status: He is alert and oriented to person, place, and time. Mental status is at baseline.  Psychiatric:        Mood and Affect: Mood normal.        Behavior: Behavior normal.        Thought Content: Thought content normal.        Judgment: Judgment normal.     Lab Results  Component Value Date   TSH 2.840 05/13/2019   Lab Results  Component Value Date   WBC 7.3 04/10/2019   HGB 14.4 04/10/2019   HCT 42.2 04/10/2019   MCV 97.5 04/10/2019   PLT 281 04/10/2019   Lab Results  Component Value Date   NA 138 04/10/2019   K 3.4 (L) 04/10/2019   CO2 30 04/10/2019   GLUCOSE 141 (H) 04/10/2019   BUN 15 04/10/2019   CREATININE 1.22  04/10/2019   BILITOT 0.7 04/10/2019   ALKPHOS 58 04/10/2019   AST 23 04/10/2019   ALT 26 04/10/2019   PROT 7.3 04/10/2019   ALBUMIN 3.7 04/10/2019   CALCIUM 9.0 04/10/2019   ANIONGAP 10 04/10/2019   Lab Results  Component Value Date   CHOL 183 01/07/2019   Lab Results  Component Value Date   HDL 39 (L) 01/07/2019   Lab Results  Component Value Date   LDLCALC 123 (H) 01/07/2019   Lab Results  Component Value Date   TRIG 114 01/07/2019   Lab Results  Component Value Date   CHOLHDL 4.7 01/07/2019   Lab Results  Component Value Date   HGBA1C 6.2 (H) 01/20/2018

## 2019-12-27 DIAGNOSIS — G4733 Obstructive sleep apnea (adult) (pediatric): Secondary | ICD-10-CM | POA: Diagnosis not present

## 2019-12-30 ENCOUNTER — Encounter: Payer: Self-pay | Admitting: Family Medicine

## 2019-12-30 ENCOUNTER — Telehealth: Payer: Self-pay | Admitting: Pharmacist

## 2019-12-30 DIAGNOSIS — E559 Vitamin D deficiency, unspecified: Secondary | ICD-10-CM | POA: Insufficient documentation

## 2019-12-30 DIAGNOSIS — E1165 Type 2 diabetes mellitus with hyperglycemia: Secondary | ICD-10-CM | POA: Insufficient documentation

## 2019-12-30 DIAGNOSIS — I160 Hypertensive urgency: Secondary | ICD-10-CM | POA: Insufficient documentation

## 2019-12-30 DIAGNOSIS — E119 Type 2 diabetes mellitus without complications: Secondary | ICD-10-CM | POA: Insufficient documentation

## 2019-12-30 NOTE — Telephone Encounter (Signed)
Left message for patient to call back Need to reschedule appt

## 2019-12-31 ENCOUNTER — Other Ambulatory Visit: Payer: Self-pay | Admitting: Family Medicine

## 2019-12-31 ENCOUNTER — Ambulatory Visit: Payer: Medicaid Other | Admitting: Licensed Clinical Social Worker

## 2019-12-31 ENCOUNTER — Encounter: Payer: Self-pay | Admitting: Family Medicine

## 2019-12-31 ENCOUNTER — Ambulatory Visit (INDEPENDENT_AMBULATORY_CARE_PROVIDER_SITE_OTHER): Payer: Medicaid Other | Admitting: Family Medicine

## 2019-12-31 VITALS — BP 164/92 | HR 84 | Ht 72.0 in | Wt >= 6400 oz

## 2019-12-31 DIAGNOSIS — N183 Chronic kidney disease, stage 3 unspecified: Secondary | ICD-10-CM

## 2019-12-31 DIAGNOSIS — I2510 Atherosclerotic heart disease of native coronary artery without angina pectoris: Secondary | ICD-10-CM | POA: Diagnosis not present

## 2019-12-31 DIAGNOSIS — I4891 Unspecified atrial fibrillation: Secondary | ICD-10-CM

## 2019-12-31 DIAGNOSIS — I2782 Chronic pulmonary embolism: Secondary | ICD-10-CM | POA: Diagnosis not present

## 2019-12-31 DIAGNOSIS — E119 Type 2 diabetes mellitus without complications: Secondary | ICD-10-CM | POA: Diagnosis not present

## 2019-12-31 DIAGNOSIS — I10 Essential (primary) hypertension: Secondary | ICD-10-CM

## 2019-12-31 DIAGNOSIS — I480 Paroxysmal atrial fibrillation: Secondary | ICD-10-CM | POA: Insufficient documentation

## 2019-12-31 DIAGNOSIS — E1165 Type 2 diabetes mellitus with hyperglycemia: Secondary | ICD-10-CM

## 2019-12-31 DIAGNOSIS — I509 Heart failure, unspecified: Secondary | ICD-10-CM

## 2019-12-31 DIAGNOSIS — I272 Pulmonary hypertension, unspecified: Secondary | ICD-10-CM

## 2019-12-31 DIAGNOSIS — G4733 Obstructive sleep apnea (adult) (pediatric): Secondary | ICD-10-CM

## 2019-12-31 DIAGNOSIS — I2602 Saddle embolus of pulmonary artery with acute cor pulmonale: Secondary | ICD-10-CM

## 2019-12-31 MED ORDER — BLOOD GLUCOSE MONITOR KIT: PACK | 0 refills | Status: DC

## 2019-12-31 NOTE — Patient Instructions (Addendum)
Medication Instructions:  Continue all current medications.  Labwork: none  Testing/Procedures: none  Follow-Up: 3 months   Any Other Special Instructions Will Be Listed Below (If Applicable). You have been referred to:  Hypertension clinic.  If you need a refill on your cardiac medications before your next appointment, please call your pharmacy.

## 2019-12-31 NOTE — Patient Instructions (Addendum)
Licensed Clinical Social Worker Visit Information  Goals we discussed today:   .  Client will talk with LCSW in next 30 days to discuss anxiety and stress related to health needs (pt-stated)         Current Barriers:   Mental health challenges if patient with Chronic Diagnoses of HTN, CKD, CHF  Stress/Anxiety over health conditions  Social isolation issues  Clinical Social Work Clinical Goal(s):   LCSW will talk with client in next 30 days to discuss client stress/anxiety regarding health conditions   Interventions:  Talked with [name] about CCM program  Talked with Cloyce about RNCM support with [facility]  Talked with [facility] about sleeping issues of client  Talked with client about social support network (his brother is supportive)  Talked with client about relaxation techniques (watches TV)  Talked with Randee about his support with [facility] in [place], [place]  Talked with [name] about his appointment today with cardiologist   Talked with client about upcoming client medical appointments  Talked with Waymon about his use of CPap equipment  Patient Self Care Activities:   Takes medications as prescribed  Patient Self Care Deficits Social isolation issues  Initial goal documentation      Follow Up Plan:LCSW to call client/Mark Headley, brother,in next 4 weeks to talk with client/brotherabout stress/anxiety issuesclientfaces regarding health statusof client  Materials Provided: No  The patient verbalized understanding of instructions provided today and declined a print copy of patient instruction materials.   [name].Erby Sanderson MSW, LCSW Licensed Clinical Social Worker [facility] [phone]

## 2019-12-31 NOTE — Progress Notes (Signed)
Cardiology Office Note  Date: 12/31/2019   ID: ROBERTS BON, DOB [birth year], MRN [id number]  PCP:  [name], [name]  Cardiologist:  No primary care provider on file. Electrophysiologist:  None   Chief Complaint: Follow-up pulmonary hypertension,  History of Present Illness: Bradley Torres is a 59 y.o. 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  Last encounter with Dr. Bronson Ing 05/06/2019: Describe the breathing spells which tended to occur more frequently during the winter.  He was seldom having palpitations.  He states during the wintertime he was confined to one room and becomes claustrophobic leading to anxiety.  He described a lump in his throat.  He denied leg swelling, orthopnea or PND.  Cardiac catheterization 2019 showed 10% proximal LAD stenosis and no obstructive disease.  Dr. Bronson Ing earlier needed risk factor modification.  He was continue Toprol-XL for palpitations.  Seldom had symptoms.  Blood pressure was borderline and there were no changes to therapy.  He was continuing with the Xarelto for his saddle pulmonary embolism.  Pulmonary hypertension was likely secondary to morbid obesity and moderate to severe's obstructive sleep apnea.  Sleep study results from 09/12/2018 were reviewed.  He needed to follow-up with his sleep specialist.  A follow-up echocardiogram was ordered.  His OSA was moderate to severe.  He needed to follow-up with a sleep specialist.  Saw his primary care provider on 12/25/2019.  She advised hydralazine but Bradley Torres declined as he did not want to take anymore than once daily.  He was advised that if he was not going to take a new medication that he needed to go to the emergency room due to his blood pressure.  Apparently he was not agreeable and stated he would just follow-up with  cardiology on 12/31/2019.  He understood he was at risk of having a heart attack, stroke or death due to uncontrolled blood pressure.  PCP noted in a previous note on 12/12/2019 the Bradley Torres was unable to tolerate thiazide diuretics, CCB's, ARB's, ACE inhibitors.  She recommended hydralazine at the time.  She apparently ordered spironolactone 25 mg daily.  Bradley Torres called his PCPs office on 12/17/2019 stating he stopped the new blood pressure medication (spironolactone) because it was not working.  Stated was causing his legs to break out and feeling as though his legs were hot.  Bradley Torres is here today for follow-up for uncontrolled blood pressure.  As noted above recently saw his PCP who attempted to start him on hydralazine.  He refused stating he did not want to take the medication more than once a day.  She did  prescribe spironolactone.  Bradley Torres states his legs broke out and multiple areas and made his legs feel hot.  He attributed this to starting the spironolactone.  He stopped the medication.  He has multiple intolerances to multiple antihypertensive medications including hydralazine, amlodipine, other CCB's, ARB's, ACE inhibitor's.  Bradley Torres has significant pain issues in knees, hips, and legs.  He states he used to smoke a significant amount of marijuana over the last 40 years.  He states he recently had to stop due to the fact that he was going to see a pain management provider and could not have illicit substances on board in order to be treated for his pain issues.  He stated when he was smoking marijuana his blood pressure was well controlled.  He appears  to have a significant amount of anxiety issues.  He states the marijuana over the years was the only thing that helped his anxiety and anger issues.  States he has tried diazepam and other benzodiazepines in the past but currently is not taking any antianxiety medications.  He states he is frustrated at not being able to manage his blood pressure.  States  he has some issues with ambulation due to joint disease.  Has history of severe obesity.  States he has had a CVA in the past and has some balance issues.  He states his blood sugar has been up recently.  His recent hemoglobin A1c was 7.7 at PCP office.  His EKG today shows rate controlled atrial fibrillation with a rate of 95.  Currently taking Xarelto 20 mg daily as well as Toprol-XL 100 mg daily.  Past Medical History:  Diagnosis Date  . CAD (coronary artery disease)    a. 12/2017: cath showing 10% Proximal-LAD stenosis with no significant obstructive disease and LVEDP mildly elevated at 18 mm Hg.   Marland Kitchen Chronic back pain   . Diabetes mellitus, type 2 ([place])   . DVT (deep venous thrombosis) ([place])   . Essential hypertension   . Morbid obesity ([place])   . Panic disorder   . Prediabetes   . Shoulder pain, left    Following fall  . Vitamin D deficiency     Past Surgical History:  Procedure Laterality Date  . Ankle fracture sugery Left   . COLONOSCOPY WITH PROPOFOL N/A 05/29/2017   Dr. [name]: Multiple tubular adenomas removed, prep inadequate.  Short interval surveillance colonoscopy recommended in 6 months  . LEFT HEART CATH AND CORONARY ANGIOGRAPHY N/A 01/23/2018   Procedure: LEFT HEART CATH AND CORONARY ANGIOGRAPHY;  Surgeon: Troy Sine, MD;  Location: [facility];  Service: Cardiovascular;  Laterality: N/A;  . LUMBAR SPINE SURGERY     fusion  . POLYPECTOMY  05/29/2017   Procedure: POLYPECTOMY;  Surgeon: Daneil Dolin, MD;  Location: [facility];  Service: Endoscopy;;  ascending colon polyp x5-cs transverse colon polyp x4- cs descending colon polyp x1- hs sigmoid colon polyp x1 -hs rectal polyp x1- hs  . TONSILLECTOMY      Current Outpatient Medications  Medication Sig Dispense Refill  . blood glucose meter kit and supplies KIT Dispense based on Bradley Torres and insurance preference. Use up to four times daily as directed. (FOR ICD-9 250.00, 250.01). 1 each 0  . metoprolol  succinate (TOPROL-XL) 100 MG 24 hr tablet TAKE 1 TABLET BY MOUTH DAILY. take with OR immediately following a meal 30 tablet 0  . oxyCODONE-acetaminophen (PERCOCET) 10-325 MG tablet Take 1 tablet by mouth 3 (three) times daily as needed.    . rivaroxaban (XARELTO) 20 MG TABS tablet TAKE 1 TABLET BY MOUTH DAILY WITH SUPPER 30 tablet 11  . VITAMIN D PO Take by mouth once a week.     No current facility-administered medications for this visit.   Allergies:  Lisinopril, Ibuprofen, Spironolactone, Amlodipine, Diflucan [fluconazole], Hctz [hydrochlorothiazide], and Losartan   Social History: The Bradley Torres  reports that he has never smoked. He has never used smokeless tobacco. He reports current alcohol use. He reports previous drug use. Drug: Marijuana.   Family History: The Bradley Torres's family history includes Colon cancer in his mother; Heart attack in his father and paternal grandfather; Hepatitis (age of onset: 40) in his sister.   ROS:  Please see the history of present illness. Otherwise, complete review  of systems is positive for none.  All other systems are reviewed and negative.   Physical Exam: VS:  BP (!) 164/92   Pulse 84   Ht 6' (1.829 m)   Wt (!) 406 lb 3.2 oz (184.3 kg)   SpO2 95%   BMI 55.09 kg/m , BMI Body mass index is 55.09 kg/m.  Wt Readings from Last 3 Encounters:  12/31/19 (!) 406 lb 3.2 oz (184.3 kg)  12/25/19 (!) 408 lb (185.1 kg)  12/12/19 (!) 402 lb 3.2 oz (182.4 kg)    General: Severely morbidly obese Bradley Torres appears comfortable at rest. Neck: Supple, no elevated JVP or carotid bruits, no thyromegaly. Lungs: Clear to auscultation, nonlabored breathing at rest. Cardiac: Irregularly irregular rate and rhythm, no S3 or significant systolic murmur, no pericardial rub. Extremities: No pitting edema, distal pulses 2+. Skin: Warm and dry. Musculoskeletal: No kyphosis. Neuropsychiatric: Alert and oriented x3, affect grossly appropriate.  ECG:  An ECG dated 12/31/2019 was  personally reviewed today and demonstrated:  Atrial fibrillation rate of 95 bpm.  Recent Labwork: 04/10/2019: B Natriuretic Peptide 125.0 05/13/2019: TSH 2.840 12/16/2019: ALT 19; AST 18; BUN 21; Creatinine 1.3; Hemoglobin 13.2; Platelets 272; Potassium 4.6; Sodium 139     Component Value Date/Time   CHOL 183 01/07/2019 1134   TRIG 114 01/07/2019 1134   HDL 39 (L) 01/07/2019 1134   CHOLHDL 4.7 01/07/2019 1134   CHOLHDL 5.1 01/20/2018 0209   VLDL 26 01/20/2018 0209   LDLCALC 123 (H) 01/07/2019 1134    Other Studies Reviewed Today:   Echocardiogram: 01/23/2018 Study Conclusions  - Left ventricle: The cavity size was normal. Systolic function was normal. The estimated ejection fraction was in the range of 60% to 65%. Wall motion was normal; there were no regional wall motion abnormalities. The study was not technically sufficient to allow evaluation of LV diastolic dysfunction due to atrial fibrillation. - Aortic valve: Trileaflet; normal thickness, mildly calcified leaflets. Valve area (VTI): 2.65 cm^2. Valve area (Vmean): 3.21 cm^2. - Mitral valve: Calcified annulus. - Right ventricle: The cavity size was moderately dilated. Wall thickness was normal. - Right atrium: The atrium was mildly dilated. - Tricuspid valve: There was moderate regurgitation. - Pulmonary arteries: PA peak pressure: 64 mm Hg (S).  Impressions:  - The right ventricular systolic pressure was increased consistent with moderate pulmonary hypertension.  Cardiac Catheterization: 01/23/2018  Prox LAD lesion is 10% stenosed.  The left ventricular ejection fraction is 50-55% by visual estimate.  LV end diastolic pressure is mildly elevated.  The left ventricular systolic function is normal.  No significant coronary obstructive disease with essentially normal coronary arteries and only mild luminal irregularity of the proximal LAD of 10%; normal left circumflex and normal dominant  RCA.  Low normal global LV function with an ejection fraction of 50 to 55% without definitive focal segmental wall motion abnormalities. LVEDP is 18 mmHg.  RECOMMENDATION: Medical therapy. Weight loss is essential. The Bradley Torres should be evaluated for obstructive sleep apnea.  No indication for antiplatelet therapy at this time.  Echocardiogram: 03/01/2018 Study Conclusions  - Left ventricle: The cavity size was normal. Wall thickness was increased in a pattern of mild LVH. Systolic function was normal. The estimated ejection fraction was in the range of 55% to 60%. Wall motion was normal; there were no regional wall motion abnormalities. Left ventricular diastolic function parameters were normal. - Aortic valve: Mildly calcified annulus. Trileaflet. There was no stenosis. Mean gradient (S): 8 mm Hg. Valve area (VTI):   2.31 cm^2. - Mitral valve: Mildly calcified annulus. - Right ventricle: The cavity size was moderately dilated. Systolic function was mildly reduced. - Right atrium: Central venous pressure (est): 3 mm Hg. - Atrial septum: No defect or patent foramen ovale was identified. - Tricuspid valve: There was mild regurgitation. - Pulmonary arteries: Systolic pressure was severely increased. PA peak pressure: 87 mm Hg (S). - Pericardium, extracardiac: There was no pericardial effusion.  Assessment and Plan:  1. CAD in native artery   2. Atrial fibrillation, unspecified type (HCC)   3. Essential hypertension   4. 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 Pulmonary hypertension, unspecified (HCC)   6. OSA (obstructive sleep apnea)   7. Morbid (severe) obesity due to excess calories (HCC)   8. Type 2 diabetes mellitus without complication, without long-term current use of insulin (HCC)    1. CAD in native artery Cardiac catheterization 01/23/2018: Proximal LAD 10% stenosed.  No significant obstructive coronary artery  disease with essentially normal coronary arteries only mild luminal irregularity of proximal LAD at 10%.  Normal left circumflex, normal dominant RCA.  EF 50 to 55%.  Denies any anginal or exertional symptoms.  2.  Atrial fibrillation EKG today shows atrial fibrillation with a rate of 95 bpm.  Continue Toprol-XL 100 mg daily.  Continue Xarelto 20 mg daily.  Bradley Torres is on Xarelto for chronic saddle pulmonary embolism.  3. Essential hypertension Uncontrolled blood pressure.  BP today 164/92.  Bradley Torres is intolerant to multiple antihypertensive medications including ACE inhibitors, ARB's, calcium channel blockers, aldosterone inhibitors.  Recently refused hydralazine by PCP due to the fact that he did not want to take the medication multiple times per day currently on Toprol-XL 100 mg daily.  Offered to increase Toprol to 200 mg daily.  Bradley Torres was reluctant.  Given multiple intolerances and reluctance to increase beta-blocker.  Advised Bradley Torres we could send him to hypertension clinic for evaluation and management.  Bradley Torres is agreeable.  We will refer to hypertension clinic.  Bradley Torres has significant issues with anxiety which may be playing a role in addition to multiple other factors playing a role.  Of note Bradley Torres states he has been a long-term user of marijuana approximately 40 + years of use.  States he recently had to stop marijuana use in order to start treatment with pain management provider for degenerative joint disease.  He stated prior to stopping marijuana use his blood pressure was under control.  States that he stopped marijuana use his blood pressure has been out of control.  4. 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) History of PE on Xarelto chronically 20 mg daily.  Continue Xarelto 20 mg daily.  5. Pulmonary hypertension, unspecified (HCC) Last echocardiogram on 03/11/2018: Mild LVH, EF 55-60, RV moderately dilated, mild TR, severely increased PA peak pressure  of 87 mmHg.  6. OSA (obstructive sleep apnea) Has history of moderate to severe obstructive sleep apnea.  Dr. Koneswaran in his note on May 06, 2019 stated he needed to follow-up with sleep specialist.  7.  Severe morbid obesity. Severe morbid obesity with BMI of 53.  Current weight of 406.  Bradley Torres needs significant weight loss along with dietary modifications and exercise.  May benefit from nutritionist consult.  Will defer to PCP.  8.  Type II DM Recently seen by PCP.  She performed a hemoglobin A1c on the Bradley Torres which was 7.7%.  She ordered diabetes supplies.  She has not not started   the Bradley Torres on antihyperglycemic medications at this point.  She has scheduled a follow-up visit for new diabetic.   Medication Adjustments/Labs and Tests Ordered: Current medicines are reviewed at length with the Bradley Torres today.  Concerns regarding medicines are outlined above.   Disposition: Follow-up with Samuel McDowell or APP 3 months  Signed, Andrew Quinn, NP 12/31/2019 5:07 PM    [facility] [place] [address], [place], [place] [address] Phone: [phone]; Fax: [phone] 

## 2019-12-31 NOTE — Chronic Care Management (AMB) (Signed)
  Chronic Care Management    Clinical Social Work Follow Up Note  12/31/2019 Name: Bradley Torres MRN: [id number] DOB: 05/27/1960  Bradley Torres is a 59 y.o. year old male who is a primary care patient of Bradley Brooklyn, [name]. The CCM team was consulted for assistance with [name] .   Review of patient status, including review of consultants reports, other relevant assessments, and collaboration with appropriate care team members and the patient's provider was performed as part of comprehensive patient evaluation and provision of chronic care management services.    SDOH (Social Determinants of Health) assessments performed: No;risk for depression; risk for tobacco use; risk for stress    Chronic Care Management from 05/15/2019 in [facility]  PHQ-9 Total Score 6       GAD 7 : Generalized Anxiety Score 05/15/2019 05/13/2019  Nervous, Anxious, on Edge 1 3  Control/stop worrying 1 3  Worry too much - different things 1 3  Trouble relaxing 1 3  Restless 0 2  Easily annoyed or irritable 0 3  Afraid - awful might happen 1 3  Total GAD 7 Score 5 20  Anxiety Difficulty Somewhat difficult Somewhat difficult    Outpatient Encounter Medications as of 12/31/2019  Medication Sig  . blood glucose meter kit and supplies KIT Dispense based on patient and insurance preference. Use up to four times daily as directed. (FOR ICD-9 250.00, 250.01).  . metoprolol succinate (TOPROL-XL) 100 MG 24 hr tablet TAKE 1 TABLET BY MOUTH DAILY. take with OR immediately following a meal  . oxyCODONE-acetaminophen (PERCOCET) 10-325 MG tablet Take 1 tablet by mouth 3 (three) times daily as needed.  . rivaroxaban (XARELTO) 20 MG TABS tablet TAKE 1 TABLET BY MOUTH DAILY WITH SUPPER  . VITAMIN D PO Take by mouth once a week.   No facility-administered encounter medications on file as of 12/31/2019.    Goals    .  Client will talk with Bradley Torres in next 30 days to discuss anxiety and stress  related to health needs (pt-stated)      Current Barriers:  Marland Kitchen Mental health challenges if patient with Chronic Diagnoses of HTN, CKD, CHF . Stress/Anxiety over health conditions . Social isolation issues  Clinical Social Work Clinical Goal(s):  Marland Kitchen Bradley Torres will talk with client in next 30 days to discuss client stress/anxiety regarding health conditions   Interventions: . Talked with Bradley Torres about CCM program . Talked with Bradley Torres about RNCM support with [facility] . Talked with Bradley Torres about sleeping issues of client . Talked with client about social support network (Bradley Torres is supportive)  Talked with client about relaxation techniques (watches TV)  Talked with Bradley Torres about Bradley support with [facility] in [place], [place]  Talked with Bradley Torres about Bradley appointment today with cardiologist   Talked with client about upcoming client medical appointments  Talked with Bradley Torres about Bradley use of CPap equipment  Patient Self Care Activities:  . Takes medications as prescribed  Patient Self Care Deficits Social isolation issues  Initial goal documentation      Follow Up Plan:Bradley Torres to call client/Bradley Torres, Torres,in next 4 weeks to talk with client/brotherabout stress/anxiety issuesclientfaces regarding health statusof client  Bradley Torres.Bradley Torres MSW, Bradley Torres Licensed Clinical Social Worker [facility] Care Management [phone]

## 2020-01-02 ENCOUNTER — Ambulatory Visit: Payer: Medicaid Other | Admitting: Pharmacist

## 2020-01-07 ENCOUNTER — Ambulatory Visit: Payer: Medicaid Other | Admitting: Pharmacist

## 2020-01-07 ENCOUNTER — Other Ambulatory Visit: Payer: Self-pay

## 2020-01-07 DIAGNOSIS — E1165 Type 2 diabetes mellitus with hyperglycemia: Secondary | ICD-10-CM

## 2020-01-07 MED ORDER — ONETOUCH VERIO VI STRP: ORAL_STRIP | 12 refills | Status: DC

## 2020-01-07 MED ORDER — ONETOUCH VERIO FLEX SYSTEM W/DEVICE KIT: PACK | 0 refills | Status: DC

## 2020-01-07 MED ORDER — ONETOUCH DELICA LANCETS 30G MISC: 11 refills | Status: DC

## 2020-01-07 MED ORDER — DAPAGLIFLOZIN PROPANEDIOL 10 MG PO TABS: 10.0000 mg | ORAL_TABLET | Freq: Every day | ORAL | 3 refills | Status: DC

## 2020-01-07 MED ORDER — BLOOD GLUCOSE MONITOR KIT: PACK | 0 refills | Status: DC

## 2020-01-07 NOTE — Progress Notes (Signed)
° ° °  01/07/2020 Name: Bradley Torres MRN: [id number] DOB: 1960/09/04   S:  17 Yom Presents for diabetes evaluation, education, and management Patient was referred and last seen by Primary Care Provider on 12/25/19. Patient reports Diabetes was diagnosed in 11/2019.  Insurance coverage/medication affordability: MEDICAID  Patient reports adherence with medications.  Current diabetes medications include: NEW DIAGNOSIS Current hypertension medications include: METOPROLOL, PT TO SEE [facility] HTN CLINIC ON Monday 01/13/20; multiple intolerances to medications Goal 130/80  Current hyperlipidemia medications include: WILL ADDRESS AT NEXT VISIT  Patient reported dietary habits: Eats 2-3 meals/day Eats meat, veggies, carb  Drinks:3 sodas, 1 sweet tea per day  Patient-reported exercise habits: n/a  O:  Lab Results  Component Value Date   HGBA1C 7.7 (H) 12/25/2019   Lipid Panel     Component Value Date/Time   CHOL 183 01/07/2019 1134   TRIG 114 01/07/2019 1134   HDL 39 (L) 01/07/2019 1134   CHOLHDL 4.7 01/07/2019 1134   CHOLHDL 5.1 01/20/2018 0209   VLDL 26 01/20/2018 0209   LDLCALC 123 (H) 01/07/2019 1134    Home fasting blood sugars: n/a  2 hour post-meal/random blood sugars: n/a.   A/P:  Diabetes T2DM, NEW ONSET.  -CALLED IN ONE TOUCH VERIO BLOOD SUGAR for patient to test 1-2 times daily  -Started SGLT2-I FARXIGA (generic name DAPAGLIFLOZIN) 10MG  DAILY. Patient with significant cardiac history and BP issues.  Consider GLP1 in the future if patient amenable to (we discussed this for weight loss, etc)  Counseled Sick day rules: if sick, vomiting, have diarrhea, or  cannot drink enough fluids, you should stop taking SGLT-2 inhibitors until your symptoms go away. In rare cases, these medicines can cause diabetic ketoacidosis (DKA). DKA is acid buildup in the blood.  -Address statin at visit in November.  Patient with extensive allergy/intolerance list.  Do not want to complicate  regimen.  -Extensively discussed pathophysiology of diabetes, recommended lifestyle interventions, dietary effects on blood sugar control  -Counseled on s/sx of and management of hypoglycemia  -Next A1C anticipated 3 months .  Written patient instructions provided.  Total time in face to face counseling 30 minutes.   Follow up PCP Clinic Visit ON 02/06/20  Regina Eck, PharmD, BCPS Clinical Pharmacist, [place]  [facility] Phone [phone]

## 2020-01-13 ENCOUNTER — Encounter: Payer: Self-pay | Admitting: Cardiovascular Disease

## 2020-01-13 ENCOUNTER — Ambulatory Visit (INDEPENDENT_AMBULATORY_CARE_PROVIDER_SITE_OTHER): Payer: Medicaid Other | Admitting: Cardiovascular Disease

## 2020-01-13 ENCOUNTER — Other Ambulatory Visit: Payer: Self-pay

## 2020-01-13 VITALS — BP 150/86 | HR 84 | Ht 72.0 in | Wt >= 6400 oz

## 2020-01-13 DIAGNOSIS — I4891 Unspecified atrial fibrillation: Secondary | ICD-10-CM | POA: Diagnosis not present

## 2020-01-13 DIAGNOSIS — I10 Essential (primary) hypertension: Secondary | ICD-10-CM

## 2020-01-13 DIAGNOSIS — I5032 Chronic diastolic (congestive) heart failure: Secondary | ICD-10-CM

## 2020-01-13 DIAGNOSIS — G4733 Obstructive sleep apnea (adult) (pediatric): Secondary | ICD-10-CM

## 2020-01-13 MED ORDER — BYSTOLIC 20 MG PO TABS: 20.0000 mg | ORAL_TABLET | Freq: Every day | ORAL | 3 refills | Status: DC

## 2020-01-13 NOTE — Patient Instructions (Addendum)
Medication Instructions:  STOP METOPROLOL   START BYSTOLIC 20 MG DAILY    Labwork: NONE    Testing/Procedures: NONE    Follow-Up: 1 MONTH VIRTUAL VISIT    Special Instructions:   MONITOR YOUR BLOOD PRESSURE TWICE A DAY, LOG IN BOOK PROVIDED. HAVE BOOK AVAILABLE AT YOUR FOLLOW UP    DASH Eating Plan DASH stands for "Dietary Approaches to Stop Hypertension." The DASH eating plan is a healthy eating plan that has been shown to reduce high blood pressure (hypertension). It may also reduce your risk for type 2 diabetes, heart disease, and stroke. The DASH eating plan may also help with weight loss. What are tips for following this plan?  General guidelines  Avoid eating more than 2,300 mg (milligrams) of salt (sodium) a day. If you have hypertension, you may need to reduce your sodium intake to 1,500 mg a day.  Limit alcohol intake to no more than 1 drink a day for nonpregnant women and 2 drinks a day for men. One drink equals 12 oz of beer, 5 oz of wine, or 1 oz of hard liquor.  Work with your health care provider to maintain a healthy body weight or to lose weight. Ask what an ideal weight is for you.  Get at least 30 minutes of exercise that causes your heart to beat faster (aerobic exercise) most days of the week. Activities may include walking, swimming, or biking.  Work with your health care provider or diet and nutrition specialist (dietitian) to adjust your eating plan to your individual calorie needs. Reading food labels   Check food labels for the amount of sodium per serving. Choose foods with less than 5 percent of the Daily Value of sodium. Generally, foods with less than 300 mg of sodium per serving fit into this eating plan.  To find whole grains, look for the word "whole" as the first word in the ingredient list. Shopping  Buy products labeled as "low-sodium" or "no salt added."  Buy fresh foods. Avoid canned foods and premade or frozen meals. Cooking  Avoid  adding salt when cooking. Use salt-free seasonings or herbs instead of table salt or sea salt. Check with your health care provider or pharmacist before using salt substitutes.  Do not fry foods. Cook foods using healthy methods such as baking, boiling, grilling, and broiling instead.  Cook with heart-healthy oils, such as olive, canola, soybean, or sunflower oil. Meal planning  Eat a balanced diet that includes: ? 5 or more servings of fruits and vegetables each day. At each meal, try to fill half of your plate with fruits and vegetables. ? Up to 6-8 servings of whole grains each day. ? Less than 6 oz of lean meat, poultry, or fish each day. A 3-oz serving of meat is about the same size as a deck of cards. One egg equals 1 oz. ? 2 servings of low-fat dairy each day. ? A serving of nuts, seeds, or beans 5 times each week. ? Heart-healthy fats. Healthy fats called Omega-3 fatty acids are found in foods such as flaxseeds and coldwater fish, like sardines, salmon, and mackerel.  Limit how much you eat of the following: ? Canned or prepackaged foods. ? Food that is high in trans fat, such as fried foods. ? Food that is high in saturated fat, such as fatty meat. ? Sweets, desserts, sugary drinks, and other foods with added sugar. ? Full-fat dairy products.  Do not salt foods before eating.  Try to  eat at least 2 vegetarian meals each week.  Eat more home-cooked food and less restaurant, buffet, and fast food.  When eating at a restaurant, ask that your food be prepared with less salt or no salt, if possible. What foods are recommended? The items listed may not be a complete list. Talk with your dietitian about what dietary choices are best for you. Grains Whole-grain or whole-wheat bread. Whole-grain or whole-wheat pasta. Brown rice. [place]. Bulgur. Whole-grain and low-sodium cereals. Pita bread. Low-fat, low-sodium crackers. Whole-wheat flour tortillas. Vegetables Fresh or  frozen vegetables (raw, steamed, roasted, or grilled). Low-sodium or reduced-sodium tomato and vegetable juice. Low-sodium or reduced-sodium tomato sauce and tomato paste. Low-sodium or reduced-sodium canned vegetables. Fruits All fresh, dried, or frozen fruit. Canned fruit in natural juice (without added sugar). Meat and other protein foods Skinless chicken or [place]. Ground chicken or [place]. Pork with fat trimmed off. Fish and seafood. Egg whites. Dried beans, peas, or lentils. Unsalted nuts, nut butters, and seeds. Unsalted canned beans. Lean cuts of beef with fat trimmed off. Low-sodium, lean deli meat. Dairy Low-fat (1%) or fat-free (skim) milk. Fat-free, low-fat, or reduced-fat cheeses. Nonfat, low-sodium ricotta or cottage cheese. Low-fat or nonfat yogurt. Low-fat, low-sodium cheese. Fats and oils Soft margarine without trans fats. Vegetable oil. Low-fat, reduced-fat, or light mayonnaise and salad dressings (reduced-sodium). Canola, safflower, olive, soybean, and sunflower oils. Avocado. Seasoning and other foods Herbs. Spices. Seasoning mixes without salt. Unsalted popcorn and pretzels. Fat-free sweets. What foods are not recommended? The items listed may not be a complete list. Talk with your dietitian about what dietary choices are best for you. Grains Baked goods made with fat, such as croissants, muffins, or some breads. Dry pasta or rice meal packs. Vegetables Creamed or fried vegetables. Vegetables in a cheese sauce. Regular canned vegetables (not low-sodium or reduced-sodium). Regular canned tomato sauce and paste (not low-sodium or reduced-sodium). Regular tomato and vegetable juice (not low-sodium or reduced-sodium). Angie Fava. Olives. Fruits Canned fruit in a light or heavy syrup. Fried fruit. Fruit in cream or butter sauce. Meat and other protein foods Fatty cuts of meat. Ribs. Fried meat. Berniece Salines. Sausage. Bologna and other processed lunch meats. Salami. Fatback. Hotdogs.  Bratwurst. Salted nuts and seeds. Canned beans with added salt. Canned or smoked fish. Whole eggs or egg yolks. Chicken or [place] with skin. Dairy Whole or 2% milk, cream, and half-and-half. Whole or full-fat cream cheese. Whole-fat or sweetened yogurt. Full-fat cheese. Nondairy creamers. Whipped toppings. Processed cheese and cheese spreads. Fats and oils Butter. Stick margarine. Lard. Shortening. Ghee. Bacon fat. Tropical oils, such as coconut, palm kernel, or palm oil. Seasoning and other foods Salted popcorn and pretzels. Onion salt, garlic salt, seasoned salt, table salt, and sea salt. Worcestershire sauce. Tartar sauce. Barbecue sauce. Teriyaki sauce. Soy sauce, including reduced-sodium. Steak sauce. Canned and packaged gravies. Fish sauce. Oyster sauce. Cocktail sauce. Horseradish that you find on the shelf. Ketchup. Mustard. Meat flavorings and tenderizers. Bouillon cubes. Hot sauce and Tabasco sauce. Premade or packaged marinades. Premade or packaged taco seasonings. Relishes. Regular salad dressings. Where to find more information:  National Heart, Lung, and [place]: https://wilson-eaton.com/  American Heart Association: www.heart.org Summary  The DASH eating plan is a healthy eating plan that has been shown to reduce high blood pressure (hypertension). It may also reduce your risk for type 2 diabetes, heart disease, and stroke.  With the DASH eating plan, you should limit salt (sodium) intake to 2,300 mg a day. If you have  hypertension, you may need to reduce your sodium intake to 1,500 mg a day.  When on the DASH eating plan, aim to eat more fresh fruits and vegetables, whole grains, lean proteins, low-fat dairy, and heart-healthy fats.  Work with your health care provider or diet and nutrition specialist (dietitian) to adjust your eating plan to your individual calorie needs. This information is not intended to replace advice given to you by your health care provider. Make sure you  discuss any questions you have with your health care provider. Document Released: 03/03/2011 Document Revised: 02/24/2017 Document Reviewed: 03/07/2016 Elsevier Patient Education  2020 [employer].

## 2020-01-13 NOTE — Progress Notes (Signed)
Hypertension Clinic Initial Assessment:    Date:  01/14/2020   ID:  Bradley Torres, DOB 1961/03/11, MRN [id number]  PCP:  [name], [name]  Cardiologist:  No primary care provider on file.  Nephrologist:  Referring MD: Verta Ellen., NP   CC: Hypertension  History of Present Illness:    Bradley Torres is a 59 y.o. male with a hx of atrial fibrillation, hypertension, hyperlipidemia, diabetes, pulmonary hypertension, PE, OSA, minimal coronary calcification, morbid obesity and anxiety here to establish care in the hypertension clinic.  He was first diagnosed with hypertension in his late 59s and has consistently struggled to control it.  He takes metoprolol for palpitations.  His PCP saw him 11/2019 and recommended adding hydralazine to his regimen but he was unwilling to take anything more than once per day.  He saw Katina Dung, NP, on 12/2019 and his blood pressure was 164/92.  It was recommended that he increase his metoprolol to 200 mg but he was reluctant, despite the fact that his heart rate was 95.  He was referred to advanced hypertension clinic.  He notes that the metoprolol makes him feel fatigued.  He is frustrated that he doesn't have much energy.  This limits his ability to exercise.  He also has a lot of pain in his knees and ankles if he tries to walk for extended periods of time.  He sometimes has mild chest discomfort and short of breath with exertion.  He reports having heart attacks, though is cath in 2019 revealed 10% LAD disease.  He does have known sleep apnea and has a machine.  However he is in the process of getting a new mask as his currently does not work.  He notes that he is unable to cook for himself because he cannot stand long enough.  His brother owns a restaurant around the corner and he is able to eat there for free.  He does eat lots of vegetables but reports that most of his meats are fried.  He likes [employer] and sweet tea.  He has 2-3 sodas daily  and 2-3 sweet tea.  He notes that when he has a cold he uses Advil Cold and Sinus, which contain pseudoephedrine.  He does not otherwise use NSAIDs.  He rarely drinks alcohol.  He is unable to check his blood pressure or blood sugars at home because he cannot afford the machines.  He does have some swelling in his legs that gets worse at the end of the day after sitting or standing for long periods of time.  He has no orthopnea or PND.  He notes that both his blood pressure and blood sugars were better controlled when he was able to use marijuana.  He started smoking at the age of 82.  This helped with his anxiety and anger issues.  However he is no longer able to use this because of being on a pain management contract.   Previous antihypertensives: Thiazide- gout CCB ARB ACE-I- cough, wheezing Spironolactone- rash   Past Medical History:  Diagnosis Date  . CAD (coronary artery disease)    a. 12/2017: cath showing 10% Proximal-LAD stenosis with no significant obstructive disease and LVEDP mildly elevated at 18 mm Hg.   Marland Kitchen Chronic back pain   . Diabetes mellitus, type 2 ([place])   . DVT (deep venous thrombosis) ([place])   . Essential hypertension   . Morbid obesity ([place])   . Panic disorder   .  Prediabetes   . Shoulder pain, left    Following fall  . Vitamin D deficiency     Past Surgical History:  Procedure Laterality Date  . Ankle fracture sugery Left   . COLONOSCOPY WITH PROPOFOL N/A 05/29/2017   Dr. [name]: Multiple tubular adenomas removed, prep inadequate.  Short interval surveillance colonoscopy recommended in 6 months  . LEFT HEART CATH AND CORONARY ANGIOGRAPHY N/A 01/23/2018   Procedure: LEFT HEART CATH AND CORONARY ANGIOGRAPHY;  Surgeon: Troy Sine, MD;  Location: [facility];  Service: Cardiovascular;  Laterality: N/A;  . LUMBAR SPINE SURGERY     fusion  . POLYPECTOMY  05/29/2017   Procedure: POLYPECTOMY;  Surgeon: Daneil Dolin, MD;  Location: [facility];  Service:  Endoscopy;;  ascending colon polyp x5-cs transverse colon polyp x4- cs descending colon polyp x1- hs sigmoid colon polyp x1 -hs rectal polyp x1- hs  . TONSILLECTOMY      Current Medications: Current Meds  Medication Sig  . blood glucose meter kit and supplies KIT Dispense based on patient and insurance preference. Use up to four times daily as directed. (FOR ICD-9 250.00, 250.01).  . Blood Glucose Monitoring Suppl (ONETOUCH VERIO FLEX SYSTEM) w/Device KIT USE TO TEST BLOOD SUGAR DAILY AS DIRECTED. DX: 11.9  . dapagliflozin propanediol (FARXIGA) 10 MG TABS tablet Take 1 tablet (10 mg total) by mouth daily before breakfast.  . glucose blood (ONETOUCH VERIO) test strip USE TO TEST BLOOD SUGAR DAILY AS DIRECTED. DX: 11.9  . OneTouch Delica Lancets 46K MISC USE TO TEST BLOOD SUGAR DAILY AS DIRECTED. DX: 11.9  . oxyCODONE-acetaminophen (PERCOCET) 10-325 MG tablet Take 1 tablet by mouth 3 (three) times daily as needed.  . rivaroxaban (XARELTO) 20 MG TABS tablet TAKE 1 TABLET BY MOUTH DAILY WITH SUPPER  . VITAMIN D PO Take by mouth once a week.  . [DISCONTINUED] metoprolol succinate (TOPROL-XL) 100 MG 24 hr tablet TAKE 1 TABLET BY MOUTH DAILY. take with OR immediately following a meal     Allergies:   Lisinopril, Ibuprofen, Spironolactone, Amlodipine, Diflucan [fluconazole], Hctz [hydrochlorothiazide], and Losartan   Social History   Socioeconomic History  . Marital status: Single    Spouse name: Not on file  . Number of children: Not on file  . Years of education: Not on file  . Highest education level: Not on file  Occupational History  . Not on file  Tobacco Use  . Smoking status: Never Smoker  . Smokeless tobacco: Never Used  Vaping Use  . Vaping Use: Never used  Substance and Sexual Activity  . Alcohol use: Yes    Comment: occ  . Drug use: Not Currently    Types: Marijuana    Comment: daily; 04/19/19 none past few weeks d/t problems breathing  . Sexual activity: Not Currently     Birth control/protection: None  Other Topics Concern  . Not on file  Social History Narrative  . Not on file   Social Determinants of Health   Financial Resource Strain:   . Difficulty of Paying Living Expenses: Not on file  Food Insecurity:   . Worried About Charity fundraiser in the Last Year: Not on file  . Ran Out of Food in the Last Year: Not on file  Transportation Needs:   . Lack of Transportation (Medical): Not on file  . Lack of Transportation (Non-Medical): Not on file  Physical Activity:   . Days of Exercise per Week: Not on file  .  Minutes of Exercise per Session: Not on file  Stress: Stress Concern Present  . Feeling of Stress : Rather much  Social Connections:   . Frequency of Communication with Friends and Family: Not on file  . Frequency of Social Gatherings with Friends and Family: Not on file  . Attends Religious Services: Not on file  . Active Member of Clubs or Organizations: Not on file  . Attends Archivist Meetings: Not on file  . Marital Status: Not on file     Family History: The patient's family history includes Colon cancer in his mother; Heart attack in his father and paternal grandfather; Hepatitis (age of onset: 89) in his sister.  ROS:   Please see the history of present illness.    All other systems reviewed and are negative.  EKGs/Labs/Other Studies Reviewed:    EKG:  EKG is not ordered today.    Echocardiogram: 01/23/2018 Study Conclusions  - Left ventricle: The cavity size was normal. Systolic function was normal. The estimated ejection fraction was in the range of 60% to 65%. Wall motion was normal; there were no regional wall motion abnormalities. The study was not technically sufficient to allow evaluation of LV diastolic dysfunction due to atrial fibrillation. - Aortic valve: Trileaflet; normal thickness, mildly calcified leaflets. Valve area (VTI): 2.65 cm^2. Valve area (Vmean): 3.21 cm^2. -  Mitral valve: Calcified annulus. - Right ventricle: The cavity size was moderately dilated. Wall thickness was normal. - Right atrium: The atrium was mildly dilated. - Tricuspid valve: There was moderate regurgitation. - Pulmonary arteries: PA peak pressure: 64 mm Hg (S).  Impressions:  - The right ventricular systolic pressure was increased consistent with moderate pulmonary hypertension.  Cardiac Catheterization: 01/23/2018  Prox LAD lesion is 10% stenosed.  The left ventricular ejection fraction is 50-55% by visual estimate.  LV end diastolic pressure is mildly elevated.  The left ventricular systolic function is normal.  No significant coronary obstructive disease with essentially normal coronary arteries and only mild luminal irregularity of the proximal LAD of 10%; normal left circumflex and normal dominant RCA.  Low normal global LV function with an ejection fraction of 50 to 55% without definitive focal segmental wall motion abnormalities. LVEDP is 18 mmHg.  Recent Labs: 04/10/2019: B Natriuretic Peptide 125.0 05/13/2019: TSH 2.840 12/16/2019: ALT 19; BUN 21; Creatinine 1.3; Hemoglobin 13.2; Platelets 272; Potassium 4.6; Sodium 139   Recent Lipid Panel    Component Value Date/Time   CHOL 183 01/07/2019 1134   TRIG 114 01/07/2019 1134   HDL 39 (L) 01/07/2019 1134   CHOLHDL 4.7 01/07/2019 1134   CHOLHDL 5.1 01/20/2018 0209   VLDL 26 01/20/2018 0209   LDLCALC 123 (H) 01/07/2019 1134    Physical Exam:    VS:  BP (!) 150/86   Pulse 84   Ht 6' (1.829 m)   Wt (!) 409 lb (185.5 kg)   SpO2 95%   BMI 55.47 kg/m     Wt Readings from Last 3 Encounters:  01/13/20 (!) 409 lb (185.5 kg)  12/31/19 (!) 406 lb 3.2 oz (184.3 kg)  12/25/19 (!) 408 lb (185.1 kg)     VS:  BP (!) 150/86   Pulse 84   Ht 6' (1.829 m)   Wt (!) 409 lb (185.5 kg)   SpO2 95%   BMI 55.47 kg/m  , BMI Body mass index is 55.47 kg/m. GENERAL:  Well appearing HEENT: Pupils equal round  and reactive, fundi not visualized, oral mucosa unremarkable  NECK:  No jugular venous distention, waveform within normal limits, carotid upstroke brisk and symmetric, no bruits, no thyromegaly LYMPHATICS:  No cervical adenopathy LUNGS:  Clear to auscultation bilaterally HEART:  RRR.  PMI not displaced or sustained,S1 and S2 within normal limits, no S3, no S4, no clicks, no rubs, no murmurs ABD:  Flat, positive bowel sounds normal in frequency in pitch, no bruits, no rebound, no guarding, no midline pulsatile mass, no hepatomegaly, no splenomegaly EXT:  2 plus pulses throughout, no edema, no cyanosis no clubbing SKIN: Rash/scabs on anterior tibia L>R NEURO:  Cranial nerves II through XII grossly intact, motor grossly intact throughout PSYCH:  Cognitively intact, oriented to person place and time   ASSESSMENT:    No diagnosis found.  PLAN:    # Essential hypertension: Mr. Anglemyer blood pressure is consistently uncontrolled.  He is intolerant to multiple medications.  We cannot yet call it resistant hypertension, as he is only on metoprolol.  Given that he does tolerate beta-blockers and complains of fatigue, we will try switching metoprolol to nebivolol 20 mg daily.  We could also consider adding doxazosin in the future if needed.  We discussed many lifestyle changes such as reducing his caffeine intake.  He is drinking easily at 1000 cal of sugary beverages daily, this would also help with his weight loss.  He is unable to exercise due to his joint pain and weight.  We did discuss the PrEP program through the [facility] but he is unable to get to [facility] to participate.  His meals are free but it does not seem as though they are very healthy.  We will have our social worker reach out to see if there are any options to help assist him with affording more healthy meals.  We offered referral to the healthy weight and wellness clinic and he is not interested at this time.  He is going to work on getting  his CPAP operational.  We also advised that he should not be taking Aleve cold and sinus given that it contain pseudoephedrine.  He reports that this is the only thing that works for his sinus congestion and he does not have to use it often.   Disposition:    FU with MD/PharmD in 1 month virtually   Medication Adjustments/Labs and Tests Ordered: Current medicines are reviewed at length with the patient today.  Concerns regarding medicines are outlined above.  No orders of the defined types were placed in this encounter.  Meds ordered this encounter  Medications  . Nebivolol HCl (BYSTOLIC) 20 MG TABS    Sig: Take 1 tablet (20 mg total) by mouth daily.    Dispense:  90 tablet    Refill:  3    GENERIC, D/C METOPROLOL     Signed, Skeet Latch, MD  01/14/2020 8:13 AM    [place]

## 2020-01-14 ENCOUNTER — Ambulatory Visit: Payer: Self-pay | Admitting: Neurology

## 2020-01-16 ENCOUNTER — Telehealth: Payer: Self-pay | Admitting: Pharmacist

## 2020-01-16 ENCOUNTER — Telehealth: Payer: Self-pay | Admitting: *Deleted

## 2020-01-16 ENCOUNTER — Telehealth: Payer: Self-pay

## 2020-01-16 ENCOUNTER — Telehealth: Payer: Self-pay | Admitting: Licensed Clinical Social Worker

## 2020-01-16 NOTE — Telephone Encounter (Signed)
Please let patient know PA has been approved for [facility] He can call pharmacy to fill

## 2020-01-16 NOTE — Telephone Encounter (Signed)
Received prior auth request on Bystolic 20 mg daily Key [id number] Dr [name] is ok with generic ok  Will forward to Wonda Horner LPN

## 2020-01-16 NOTE — Telephone Encounter (Signed)
Pt needs samples of Farxiga till PA is approved. Please call back

## 2020-01-16 NOTE — Telephone Encounter (Signed)
Cyril Mourning called from [place] stating that pts insurance requires PA for [facility].   Also stated that pt is out of the [place] and needs samples on PA is approved and Rx can be filled.

## 2020-01-16 NOTE — Telephone Encounter (Signed)
  PA approved on cover my meds

## 2020-01-16 NOTE — Progress Notes (Signed)
Heart and Vascular Care Navigation  01/16/2020  Bradley Torres 08/13/1960 [id number]  Reason for Referral: General Outreach/Connection to Healthy Meals                                                                                                    Assessment:  CSW reached pt via telephone [phone]). Introduced self, role, reason for call. Pt lives home alone, generally does not use any DME to ambulate but has a cane and a walker available. Pt brother Bradley Torres [phone]) lives next door to pt and they talk/see each other daily. Pt confirmed home address and PCP- Bradley Ovens, [name]. He has Medicaid and receives SSI which he uses to pay his bills. He still drives and denies any challenges getting to his appointments. He is able to make it to his local appointments as well as to [facility] to see Dr. [name] and  He denies any issues with obtaining or affording medications. 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 [name]. CSW offered assistance with getting connected with healthier options that would be delivered to pt home. Pt declines at this time stating he does not want anything that is not hot.   Pt states he overall feels okay with managing his current health needs, he goes outside and enjoys the spring and fall weather but does not have consistent exercise routine, at this time he is not interested in any additional supports. He confirms he is also followed by Bradley Torres w/ Chronic Care Management. Pt took this CSW's name and number. If he should need any assistance w/ above he is aware he can call me back with any questions.   HRT/VAS Care Coordination    Patients Home Cardiology Office [place] Team Social Worker   Social Worker Name: Bradley Torres, [name]   Living arrangements for the past 2 months Single Family Home   Lives with: Self   Patient Current Insurance Coverage Medicaid   Patient Has  Concern With Paying Medical Bills No   Does Patient Have Prescription Coverage? Yes   Home Assistive Devices/Equipment Cane (specify quad or straight); Walker (specify type)   Current home services DME      Social History:                                                                             SDOH Screenings   Alcohol Screen:   . Last Alcohol Screening Score (AUDIT): Not on file  Depression (PHQ2-9): Low Risk   . PHQ-2 Score: 0  Financial Resource Strain:   . Difficulty of Paying Living Expenses: Not on file  Food Insecurity: No Food Insecurity  . Worried About Charity fundraiser in the Last Year: Never  true  . Ran Out of Food in the Last Year: Never true  Housing: Low Risk   . Last Housing Risk Score: 0  Physical Activity:   . Days of Exercise per Week: Not on file  . Minutes of Exercise per Session: Not on file  Social Connections: Unknown  . Frequency of Communication with Friends and Family: More than three times a week  . Frequency of Social Gatherings with Friends and Family: More than three times a week  . Attends Religious Services: Not on file  . Active Member of Clubs or Organizations: Not on file  . Attends Archivist Meetings: Not on file  . Marital Status: Not on file  Stress: Stress Concern Present  . Feeling of Stress : Rather much  Tobacco Use: Low Risk   . Smoking Tobacco Use: Never Smoker  . Smokeless Tobacco Use: Never Used  Transportation Needs: No Transportation Needs  . Lack of Transportation (Medical): No  . Lack of Transportation (Non-Medical): No    SDOH Interventions: Financial Resources:   SSI   Food Insecurity:    Access to meals at [employer] his brother owns.  Housing Insecurity:   n/a  Transportation:    Pt still drives himself to appointments.    Health Promotion Interventions:     Physical Inactivity   Smoking Cessation   Dietary Concerns Offered Moms Meals for healthier options delivered to home- pt declines at this  time.  Health Coaching    Other Care Navigation Interventions:     Inpatient/Outpatient Substance Abuse Counseling/Rehab Options n/a  Provided Pharmacy assistance resources  n/a  Patient expressed Mental Health concerns n/a  Patient Referred to: n/a   Follow-up plan:   CSW provided pt w/ name and number should he have any concerns or be interested in engaging in any additional supports/resources.

## 2020-01-17 ENCOUNTER — Telehealth: Payer: Self-pay | Admitting: Licensed Clinical Social Worker

## 2020-01-17 NOTE — Telephone Encounter (Signed)
**Note De-Identified Caia Lofaro Obfuscation** Bystolic  Key: [id number] Following message received from covermymeds:  Terell Hrivnak Key: [id number] - PA Case ID: [id number]  Outcome  Approved today  Status: Approved, Coverage Starts on: 01/17/2020, Coverage Ends on: 01/16/2021.  Drug Nebivolol HCl 20MG  tablets  [employer] [facility] Electronic [place] Form [phone] NCPDP)

## 2020-01-17 NOTE — Telephone Encounter (Signed)
Pt called stating that per the pharmacist, they are waiting for PA to go through with insurance so that they can fill his [place] Rx. Pt wants to know if we have any samples we can give him until he can get his Rx filled.

## 2020-01-17 NOTE — Telephone Encounter (Signed)
Advised pharmacy.

## 2020-01-17 NOTE — Telephone Encounter (Signed)
CSW called and spoke with Mr. Capek [phone]). Re-introduced self, role, and reason for call. I noted from HTN clinic visit that pt shared challenges w/ obtaining a means to check his blood pressure. Pt states he was provided with a cuff/machine when he left the office and has it at his home. CSW inquired about any additional needs that pt may have thought of after his call with me yesterday- no further needs stated at this time.    [name], MSW, [place] [facility]

## 2020-01-17 NOTE — Telephone Encounter (Signed)
Samples up front patient aware  

## 2020-01-17 NOTE — Telephone Encounter (Signed)
Lmtcb.

## 2020-01-17 NOTE — Telephone Encounter (Signed)
Call placed to pharmacy ([facility] in [place]) farxiga has been approved 10mg  daily #30 for 30 days  Call placed to patient.  He verbalizes understanding

## 2020-01-21 ENCOUNTER — Telehealth: Payer: Self-pay | Admitting: Cardiovascular Disease

## 2020-01-21 MED ORDER — DOXAZOSIN MESYLATE 2 MG PO TABS: 2.0000 mg | ORAL_TABLET | Freq: Every day | ORAL | 3 refills | Status: DC

## 2020-01-21 NOTE — Telephone Encounter (Signed)
Advised patient, verbalized understanding  

## 2020-01-21 NOTE — Telephone Encounter (Signed)
Pt c/o medication issue:  1. Name of Medication: Nebivolol HCl (BYSTOLIC) 20 MG TABS  2. How are you currently taking this medication (dosage and times per day)? As directed  3. Are you having a reaction (difficulty breathing--STAT)? No   4. What is your medication issue? Patient states the medication is not helping to decrease his BP. He would like to know if the dosage can be adjusted or if he is eligible for an alternative. Please advise.

## 2020-01-21 NOTE — Telephone Encounter (Signed)
Please add doxazosin 2mg  every daily to current regimen. Continue Bystolic as prescribed by Dr [name] and keep BP records.  Continue working on positive lifestyle modifications (low sodium diet, increase hydration, decrease caffeine, and increase physical activity as able)

## 2020-01-21 NOTE — Telephone Encounter (Signed)
Thanks Raquel.

## 2020-01-21 NOTE — Telephone Encounter (Signed)
Returned call to patient:  OV with Dr. [name] 10/18- 150/86 HR 84  Stopped metoprolol, started bystolic 20 mg   Patient just started recording BP last night as he just started Bystolic 1 week ago and wanted to give it time to get in his system  He takes the Bystolic around 22-63 pm daily Yesterday 12pm 191/106 60s Today AM 182/100  Now 184/102 HR 50s  Denies increase salt intake but reports eating pork chops, pintos, cabbage and mac n cheese last night, no additional salt added. He only eats one meal a day.   Advised although he didn't add salt, this meal was very high in salt and to limit/monitor his intake as this can elevate his blood pressure as well.    He denies blurred vision, reports a "twinge" of a HA on his left side.   He has not taken his medication today.   Advised to go ahead and take his Bystolic and would route to MD/pharmD to review for further recommendations.

## 2020-01-22 DIAGNOSIS — M539 Dorsopathy, unspecified: Secondary | ICD-10-CM | POA: Diagnosis not present

## 2020-01-22 DIAGNOSIS — Z79899 Other long term (current) drug therapy: Secondary | ICD-10-CM | POA: Diagnosis not present

## 2020-01-22 DIAGNOSIS — E119 Type 2 diabetes mellitus without complications: Secondary | ICD-10-CM | POA: Diagnosis not present

## 2020-01-27 DIAGNOSIS — G4733 Obstructive sleep apnea (adult) (pediatric): Secondary | ICD-10-CM | POA: Diagnosis not present

## 2020-02-05 ENCOUNTER — Ambulatory Visit: Payer: Medicaid Other | Admitting: Licensed Clinical Social Worker

## 2020-02-05 DIAGNOSIS — I10 Essential (primary) hypertension: Secondary | ICD-10-CM

## 2020-02-05 DIAGNOSIS — F419 Anxiety disorder, unspecified: Secondary | ICD-10-CM

## 2020-02-05 DIAGNOSIS — I5032 Chronic diastolic (congestive) heart failure: Secondary | ICD-10-CM

## 2020-02-05 DIAGNOSIS — N183 Chronic kidney disease, stage 3 unspecified: Secondary | ICD-10-CM

## 2020-02-05 NOTE — Chronic Care Management (AMB) (Addendum)
Chronic Care Management    Clinical Social Work Follow Up Note  02/05/2020 Name: Bradley Torres MRN: [id number] DOB: 06/26/1960  Bradley Torres is a 59 y.o. year old male who is a primary care patient of [name], [name]. The CCM team was consulted for assistance with [name] .   Review of patient status, including review of consultants reports, other relevant assessments, and collaboration with appropriate care team members and the patient's provider was performed as part of comprehensive patient evaluation and provision of chronic care management services.    SDOH (Social Determinants of Health) assessments performed: No; risk for depression; risk for tobacco use; risk for stress; risk for food insecurity; risk for transport needs    Chronic Care Management from 05/15/2019 in [place]  PHQ-9 Total Score 6       GAD 7 : Generalized Anxiety Score 05/15/2019 05/13/2019  Nervous, Anxious, on Edge 1 3  Control/stop worrying 1 3  Worry too much - different things 1 3  Trouble relaxing 1 3  Restless 0 2  Easily annoyed or irritable 0 3  Afraid - awful might happen 1 3  Total GAD 7 Score 5 20  Anxiety Difficulty Somewhat difficult Somewhat difficult    Outpatient Encounter Medications as of 02/05/2020  Medication Sig  . blood glucose meter kit and supplies KIT Dispense based on patient and insurance preference. Use up to four times daily as directed. (FOR ICD-9 250.00, 250.01).  . Blood Glucose Monitoring Suppl (ONETOUCH VERIO FLEX SYSTEM) w/Device KIT USE TO TEST BLOOD SUGAR DAILY AS DIRECTED. DX: 11.9  . dapagliflozin propanediol (FARXIGA) 10 MG TABS tablet Take 1 tablet (10 mg total) by mouth daily before breakfast.  . doxazosin (CARDURA) 2 MG tablet Take 1 tablet (2 mg total) by mouth daily.  Marland Kitchen glucose blood (ONETOUCH VERIO) test strip USE TO TEST BLOOD SUGAR DAILY AS DIRECTED. DX: 11.9  . Nebivolol HCl (BYSTOLIC) 20 MG TABS Take 1 tablet  (20 mg total) by mouth daily.  Glory Rosebush Delica Lancets 48O MISC USE TO TEST BLOOD SUGAR DAILY AS DIRECTED. DX: 11.9  . oxyCODONE-acetaminophen (PERCOCET) 10-325 MG tablet Take 1 tablet by mouth 3 (three) times daily as needed.  . rivaroxaban (XARELTO) 20 MG TABS tablet TAKE 1 TABLET BY MOUTH DAILY WITH SUPPER  . VITAMIN D PO Take by mouth once a week.   No facility-administered encounter medications on file as of 02/05/2020.    Goals    .  Client will talk with LCSW in next 30 days to discuss anxiety and stress related to health needs (pt-stated)      Current Barriers:  Marland Kitchen Mental health challenges if patient with Chronic Diagnoses of HTN, CKD, CHF . Stress/Anxiety over health conditions . Social isolation issues  Clinical Social Work Clinical Goal(s):  Marland Kitchen LCSW will talk with client in next 30 days to discuss client stress/anxiety regarding health conditions   Interventions: . Talked with [name] about CCM program . Talked with Kyi about RNCM support with [facility] . Talked with Peyton about sleeping issues of client . Talked with client about social support network (his brother is support) . Talked with client about relaxation techniques (watches TV) . Talked with client about pain issues faced . Talked with client about medication procurement . Talked with client about upcoming client appointments . Talked with client about transport needs of client  Patient Self Care Activities:  . Takes medications as prescribed  Patient Self Care  Deficits Social isolation issues  Initial goal documentation    Follow Up Plan:LCSW to call client/Mark Valletta, brother,in next 4 weeks to talk with client/brotherabout stress/anxiety issuesclientfaces regarding health statusof client  [name].Makar Slatter MSW, LCSW Licensed Clinical Social Worker [facility] Care Management [phone]

## 2020-02-05 NOTE — Patient Instructions (Addendum)
Licensed Clinical Social Worker Visit Information  Goals we discussed today:    .  Client will talk with LCSW in next 30 days to discuss anxiety and stress related to health needs (pt-stated)        Current Barriers:   Mental health challenges if patient with Chronic Diagnoses of HTN, CKD, CHF  Stress/Anxiety over health conditions  Social isolation issues  Clinical Social Work Clinical Goal(s):   LCSW will talk with client in next 30 days to discuss client stress/anxiety regarding health conditions   Interventions:  Talked with [name] about CCM program  Talked with Donnis about RNCM support with [facility]  Talked with Darrien about sleeping issues of client  Talked with client about social support network (his brother is support)  Talked with client about relaxation techniques (watches TV)  Talked with client about pain issues faced  Talked with client about medication procurement  Talked with client about upcoming client appointments  Talked with client about transport needs of client  Patient Self Care Activities:   Takes medications as prescribed  Patient Self Care Deficits Social isolation issues  Initial goal documentation    Follow Up Plan:LCSW to call client/Mark Gautier, brother,in next 4 weeks to talk with client/brotherabout stress/anxiety issuesclientfaces regarding health statusof client  Materials Provided: No   The patient verbalized understanding of instructions provided today and declined a print copy of patient instruction materials.   [name].Debbi Strandberg MSW, LCSW Licensed Clinical Social Worker [facility] Care Management [phone]

## 2020-02-06 ENCOUNTER — Ambulatory Visit: Payer: Medicaid Other | Admitting: Nurse Practitioner

## 2020-02-17 ENCOUNTER — Encounter: Payer: Self-pay | Admitting: Cardiovascular Disease

## 2020-02-17 ENCOUNTER — Ambulatory Visit (INDEPENDENT_AMBULATORY_CARE_PROVIDER_SITE_OTHER): Payer: Medicaid Other | Admitting: Cardiovascular Disease

## 2020-02-17 VITALS — BP 140/82 | HR 68 | Ht 72.0 in | Wt >= 6400 oz

## 2020-02-17 DIAGNOSIS — I5032 Chronic diastolic (congestive) heart failure: Secondary | ICD-10-CM

## 2020-02-17 DIAGNOSIS — G4733 Obstructive sleep apnea (adult) (pediatric): Secondary | ICD-10-CM

## 2020-02-17 DIAGNOSIS — I10 Essential (primary) hypertension: Secondary | ICD-10-CM

## 2020-02-17 MED ORDER — NEBIVOLOL HCL 20 MG PO TABS
ORAL_TABLET | ORAL | 3 refills | Status: DC
Start: 2020-02-17 — End: 2020-03-13

## 2020-02-17 MED ORDER — DOXAZOSIN MESYLATE 4 MG PO TABS
4.0000 mg | ORAL_TABLET | Freq: Every day | ORAL | 3 refills | Status: DC
Start: 2020-02-17 — End: 2020-04-29

## 2020-02-17 NOTE — Progress Notes (Signed)
Hypertension Clinic Follow up Assessment:    Date:  02/17/2020   ID:  [name], DOB 10/01/1960, MRN [id number]  PCP:  [name], [name]  Cardiologist:  No primary care provider on file.  Nephrologist:  Referring MD: [name], [name]   CC: Hypertension  History of Present Illness:    JASKARN SCHWEER is a 59 y.o. male with a hx of atrial fibrillation, hypertension, hyperlipidemia, diabetes, pulmonary hypertension, PE, OSA, minimal coronary calcification, morbid obesity and anxiety here for follow-up.  He initially established care in the advanced hypertension clinic 12/2019.  He was first diagnosed with hypertension in his late 25s and has consistently struggled to control it.  He was taking metoprolol for palpitations.  His PCP saw him 11/2019 and recommended adding hydralazine to his regimen but he was unwilling to take anything more than once per day.  He saw Katina Dung, NP, on 12/2019 and his blood pressure was 164/92.  It was recommended that he increase his metoprolol to 200 mg but he was reluctant, despite the fact that his heart rate was 95.  He was referred to advanced hypertension clinic.  He reported fatigue was recommended that he switch metoprolol to nebivolol.  Mr. Woolum reports having heart attacks, though is cath in 2019 revealed 10% LAD disease.  He does have known sleep apnea and has a machine.  However he is in the process of getting a new mask as his currently does not work.  It was noted that he was unable to cook for himself because he could not stand long.  His brother's restaurant food, though the salt content is high.  Most of his meats are fried and he drinks 2000 cal of sugary beverages and caffeine.  His blood pressures remain poorly controlled and he called our office.  Doxazosin was added to his regimen.  He notes that both his blood pressure and blood sugars were better controlled when he was able to use marijuana.  He started smoking at the age of 65.   This helped with his anxiety and anger issues.  However he is no longer able to use this because of being on a pain management contract.   Mr. Meckel has feeling fair.  He has light headaches off and on for "a while."  He is unable to tell how long it has been doing on.  He is struggling with sleep and anxiety.  He only gets anxious in the winter. He hasn't been getting any exercise other than ADLs.  He is mostly staying at home and not getting out. He has noted some pain in his R leg.  He was wondering if it was a blood clot.  There was no edema and has since improved. There was no calf pain and was mostly anterior.  His BP was 186/103 at home.  He doesn't check it regularly.  He checks it on his forearm and isn't sure if it is accurate.  He struggles with anxiety that is worse in the winter.  He continues ot have some sores on his lower legs. He has no orthopnea or PND.  He is mostly home and doesn't get out much when its cold outside.  He has no orthopnea or PND. His weight is up but he isn't sure why.  There haven't been any changes in his diet.  He notes that his ability to read is limited.   Previous antihypertensives: Thiazide- gout CCB ARB ACE-I- cough, wheezing Spironolactone- rash  Past Medical History:  Diagnosis Date  . CAD (coronary artery disease)    a. 12/2017: cath showing 10% Proximal-LAD stenosis with no significant obstructive disease and LVEDP mildly elevated at 18 mm Hg.   Marland Kitchen Chronic back pain   . Diabetes mellitus, type 2 ([place])   . DVT (deep venous thrombosis) ([place])   . Essential hypertension   . Morbid obesity ([place])   . Panic disorder   . Prediabetes   . Shoulder pain, left    Following fall  . Vitamin D deficiency     Past Surgical History:  Procedure Laterality Date  . Ankle fracture sugery Left   . COLONOSCOPY WITH PROPOFOL N/A 05/29/2017   Dr. [name]: Multiple tubular adenomas removed, prep inadequate.  Short interval surveillance colonoscopy recommended in 6  months  . LEFT HEART CATH AND CORONARY ANGIOGRAPHY N/A 01/23/2018   Procedure: LEFT HEART CATH AND CORONARY ANGIOGRAPHY;  Surgeon: Troy Sine, MD;  Location: [facility];  Service: Cardiovascular;  Laterality: N/A;  . LUMBAR SPINE SURGERY     fusion  . POLYPECTOMY  05/29/2017   Procedure: POLYPECTOMY;  Surgeon: Daneil Dolin, MD;  [facility];  Service: Endoscopy;;  ascending colon polyp x5-cs transverse colon polyp x4- cs descending colon polyp x1- hs sigmoid colon polyp x1 -hs rectal polyp x1- hs  . TONSILLECTOMY      Current Medications: Current Meds  Medication Sig  . blood glucose meter kit and supplies KIT Dispense based on patient and insurance preference. Use up to four times daily as directed. (FOR ICD-9 250.00, 250.01).  . Blood Glucose Monitoring Suppl (ONETOUCH VERIO FLEX SYSTEM) w/Device KIT USE TO TEST BLOOD SUGAR DAILY AS DIRECTED. DX: 11.9  . dapagliflozin propanediol (FARXIGA) 10 MG TABS tablet Take 1 tablet (10 mg total) by mouth daily before breakfast.  . doxazosin (CARDURA) 4 MG tablet Take 1 tablet (4 mg total) by mouth daily.  Marland Kitchen glucose blood (ONETOUCH VERIO) test strip USE TO TEST BLOOD SUGAR DAILY AS DIRECTED. DX: 11.9  . Nebivolol HCl (BYSTOLIC) 20 MG TABS TAKE 2 TABLETS ONCE A DAY  . OneTouch Delica Lancets 78G MISC USE TO TEST BLOOD SUGAR DAILY AS DIRECTED. DX: 11.9  . oxyCODONE-acetaminophen (PERCOCET) 10-325 MG tablet Take 1 tablet by mouth 3 (three) times daily as needed.  . rivaroxaban (XARELTO) 20 MG TABS tablet TAKE 1 TABLET BY MOUTH DAILY WITH SUPPER  . VITAMIN D PO Take by mouth once a week.  . [DISCONTINUED] doxazosin (CARDURA) 2 MG tablet Take 1 tablet (2 mg total) by mouth daily.  . [DISCONTINUED] Nebivolol HCl (BYSTOLIC) 20 MG TABS Take 1 tablet (20 mg total) by mouth daily.     Allergies:   Lisinopril, Ibuprofen, Spironolactone, Amlodipine, Diflucan [fluconazole], Hctz [hydrochlorothiazide], and Losartan   Social History    Socioeconomic History  . Marital status: Single    Spouse name: Not on file  . Number of children: Not on file  . Years of education: Not on file  . Highest education level: Not on file  Occupational History  . Not on file  Tobacco Use  . Smoking status: Never Smoker  . Smokeless tobacco: Never Used  Vaping Use  . Vaping Use: Never used  Substance and Sexual Activity  . Alcohol use: Yes    Comment: occ  . Drug use: Not Currently    Types: Marijuana    Comment: daily; 04/19/19 none past few weeks d/t problems breathing  . Sexual activity:  Not Currently    Birth control/protection: None  Other Topics Concern  . Not on file  Social History Narrative  . Not on file   Social Determinants of Health   Financial Resource Strain:   . Difficulty of Paying Living Expenses: Not on file  Food Insecurity: No Food Insecurity  . Worried About Charity fundraiser in the Last Year: Never true  . Ran Out of Food in the Last Year: Never true  Transportation Needs: No Transportation Needs  . Lack of Transportation (Medical): No  . Lack of Transportation (Non-Medical): No  Physical Activity:   . Days of Exercise per Week: Not on file  . Minutes of Exercise per Session: Not on file  Stress: Stress Concern Present  . Feeling of Stress : Rather much  Social Connections: Unknown  . Frequency of Communication with Friends and Family: More than three times a week  . Frequency of Social Gatherings with Friends and Family: More than three times a week  . Attends Religious Services: Not on file  . Active Member of Clubs or Organizations: Not on file  . Attends Archivist Meetings: Not on file  . Marital Status: Not on file     Family History: The patient's family history includes Colon cancer in his mother; Heart attack in his father and paternal grandfather; Hepatitis (age of onset: 52) in his sister.  ROS:   Please see the history of present illness.    All other systems  reviewed and are negative.  EKGs/Labs/Other Studies Reviewed:    EKG:  EKG is not ordered today.    Echocardiogram: 01/23/2018 Study Conclusions  - Left ventricle: The cavity size was normal. Systolic function was normal. The estimated ejection fraction was in the range of 60% to 65%. Wall motion was normal; there were no regional wall motion abnormalities. The study was not technically sufficient to allow evaluation of LV diastolic dysfunction due to atrial fibrillation. - Aortic valve: Trileaflet; normal thickness, mildly calcified leaflets. Valve area (VTI): 2.65 cm^2. Valve area (Vmean): 3.21 cm^2. - Mitral valve: Calcified annulus. - Right ventricle: The cavity size was moderately dilated. Wall thickness was normal. - Right atrium: The atrium was mildly dilated. - Tricuspid valve: There was moderate regurgitation. - Pulmonary arteries: PA peak pressure: 64 mm Hg (S).  Impressions:  - The right ventricular systolic pressure was increased consistent with moderate pulmonary hypertension.  Cardiac Catheterization: 01/23/2018  Prox LAD lesion is 10% stenosed.  The left ventricular ejection fraction is 50-55% by visual estimate.  LV end diastolic pressure is mildly elevated.  The left ventricular systolic function is normal.  No significant coronary obstructive disease with essentially normal coronary arteries and only mild luminal irregularity of the proximal LAD of 10%; normal left circumflex and normal dominant RCA.  Low normal global LV function with an ejection fraction of 50 to 55% without definitive focal segmental wall motion abnormalities. LVEDP is 18 mmHg.  Recent Labs: 04/10/2019: B Natriuretic Peptide 125.0 05/13/2019: TSH 2.840 12/16/2019: ALT 19; BUN 21; Creatinine 1.3; Hemoglobin 13.2; Platelets 272; Potassium 4.6; Sodium 139   Recent Lipid Panel    Component Value Date/Time   CHOL 183 01/07/2019 1134   TRIG 114 01/07/2019 1134     HDL 39 (L) 01/07/2019 1134   CHOLHDL 4.7 01/07/2019 1134   CHOLHDL 5.1 01/20/2018 0209   VLDL 26 01/20/2018 0209   LDLCALC 123 (H) 01/07/2019 1134    Physical Exam:    VS:  BP  140/82   Pulse 68   Ht 6' (1.829 m)   Wt (!) 423 lb (191.9 kg)   SpO2 97%   BMI 57.37 kg/m     Wt Readings from Last 3 Encounters:  02/17/20 (!) 423 lb (191.9 kg)  01/13/20 (!) 409 lb (185.5 kg)  12/31/19 (!) 406 lb 3.2 oz (184.3 kg)     VS:  BP 140/82   Pulse 68   Ht 6' (1.829 m)   Wt (!) 423 lb (191.9 kg)   SpO2 97%   BMI 57.37 kg/m  , BMI Body mass index is 57.37 kg/m. GENERAL:  Well appearing HEENT: Pupils equal round and reactive, fundi not visualized, oral mucosa unremarkable NECK:  No jugular venous distention, waveform within normal limits, carotid upstroke brisk and symmetric, no bruits LUNGS:  Clear to auscultation bilaterally HEART:  RRR.  PMI not displaced or sustained,S1 and S2 within normal limits, no S3, no S4, no clicks, no rubs, no murmurs ABD:  Flat, positive bowel sounds normal in frequency in pitch, no bruits, no rebound, no guarding, no midline pulsatile mass, no hepatomegaly, no splenomegaly EXT:  2 plus pulses throughout, 1+ LE edema, no cyanosis no clubbing SKIN: Rash/scabs on anterior tibia L>R NEURO:  Cranial nerves II through XII grossly intact, motor grossly intact throughout PSYCH:  Cognitively intact, oriented to person place and time   ASSESSMENT:    1. Benign essential HTN   2. Chronic diastolic CHF (congestive heart failure) ([place])   3. Severe obstructive sleep apnea-hypopnea syndrome     PLAN:    # Essential hypertension: # Chronic diastolic heart failure: Mr. Furey blood pressure is better but still above goal.  He is tolerating his current medications.  Increase nebivolol to 46m and doxazosin to 425m  He is intolerant to multiple medications.  Fatigue has improved off metoprolol.  We will continue to focus on dietary changes, but this is an uphill  battle given his free access to restaurant food and limiting cooking ability.  [facility]  We offered referral to the healthy weight and wellness clinic and he is not interested at this time.  He also isn't interested in talking to our Care Guide regarding stress management and anxiety.  He is going to work on getting his CPAP operational.  We also advised that he should not be taking Aleve cold and sinus given that it contain pseudoephedrine.  He reports that this is the only thing that works for his sinus congestion and he does not have to use it often. Time spent: 35 minutes-Greater than 50% of this time was spent in counseling, explanation of diagnosis, planning of further management, and coordination of care.   Disposition:    FU with MD/PharmD in 1 month    Medication Adjustments/Labs and Tests Ordered: Current medicines are reviewed at length with the patient today.  Concerns regarding medicines are outlined above.  No orders of the defined types were placed in this encounter.  Meds ordered this encounter  Medications  . Nebivolol HCl (BYSTOLIC) 20 MG TABS    Sig: TAKE 2 TABLETS ONCE A DAY    Dispense:  180 tablet    Refill:  3    D/C PREVIOUS RX  . doxazosin (CARDURA) 4 MG tablet    Sig: Take 1 tablet (4 mg total) by mouth daily.    Dispense:  90 tablet  Refill:  3    NEW DOSE, D/C 2 MG     Signed, Skeet Latch, MD  02/17/2020 5:37 PM    [place]

## 2020-02-17 NOTE — Patient Instructions (Signed)
Medication Instructions:  INCREASE YOUR DOXAZOSIN TO 4 MG DAILY  INCREASE YOUR BYSTOLIC TO 40 MG DAILY   Labwork: NONE  Testing/Procedures: NONE  Follow-Up: 03/17/2020 AT 3:00 PM   Any Other Special Instructions Will Be Listed Below (If Applicable).  MONITOR YOUR BLOOD PRESSURE AT HOME, BRING READINGS AND MACHINE TO YOUR FOLLOW UP

## 2020-02-18 ENCOUNTER — Encounter: Payer: Self-pay | Admitting: Nurse Practitioner

## 2020-02-18 ENCOUNTER — Ambulatory Visit: Payer: Medicaid Other | Admitting: Nurse Practitioner

## 2020-02-18 ENCOUNTER — Other Ambulatory Visit: Payer: Self-pay

## 2020-02-18 DIAGNOSIS — I10 Essential (primary) hypertension: Secondary | ICD-10-CM | POA: Diagnosis not present

## 2020-02-18 MED ORDER — DAPAGLIFLOZIN PROPANEDIOL 10 MG PO TABS: 10.0000 mg | ORAL_TABLET | Freq: Every day | ORAL | 1 refills | Status: DC

## 2020-02-18 NOTE — Patient Instructions (Addendum)
Patient seen by cardiology yesterday started patient on Cardura 4 mg tablet, advised patient to take medication as prescribed, take blood pressure log for a week and call in blood pressure values to assess therapeutic effect of Cardura.  Follow-up cardiology appointment in 4 weeks.  No changes to current medication  Hypertension, Adult Hypertension is another name for high blood pressure. High blood pressure forces your heart to work harder to pump blood. This can cause problems over time. There are two numbers in a blood pressure reading. There is a top number (systolic) over a bottom number (diastolic). It is best to have a blood pressure that is below 120/80. Healthy choices can help lower your blood pressure, or you may need medicine to help lower it. What are the causes? The cause of this condition is not known. Some conditions may be related to high blood pressure. What increases the risk?  Smoking.  Having type 2 diabetes mellitus, high cholesterol, or both.  Not getting enough exercise or physical activity.  Being overweight.  Having too much fat, sugar, calories, or salt (sodium) in your diet.  Drinking too much alcohol.  Having long-term (chronic) kidney disease.  Having a family history of high blood pressure.  Age. Risk increases with age.  Race. You may be at higher risk if you are African American.  Gender. Men are at higher risk than women before age 33. After age 31, women are at higher risk than men.  Having obstructive sleep apnea.  Stress. What are the signs or symptoms?  High blood pressure may not cause symptoms. Very high blood pressure (hypertensive crisis) may cause: ? Headache. ? Feelings of worry or nervousness (anxiety). ? Shortness of breath. ? Nosebleed. ? A feeling of being sick to your stomach (nausea). ? Throwing up (vomiting). ? Changes in how you see. ? Very bad chest pain. ? Seizures. How is this treated?  This condition is treated by  making healthy lifestyle changes, such as: ? Eating healthy foods. ? Exercising more. ? Drinking less alcohol.  Your health care provider may prescribe medicine if lifestyle changes are not enough to get your blood pressure under control, and if: ? Your top number is above 130. ? Your bottom number is above 80.  Your personal target blood pressure may vary. Follow these instructions at home: Eating and drinking   If told, follow the DASH eating plan. To follow this plan: ? Fill one half of your plate at each meal with fruits and vegetables. ? Fill one fourth of your plate at each meal with whole grains. Whole grains include whole-wheat pasta, brown rice, and whole-grain bread. ? Eat or drink low-fat dairy products, such as skim milk or low-fat yogurt. ? Fill one fourth of your plate at each meal with low-fat (lean) proteins. Low-fat proteins include fish, chicken without skin, eggs, beans, and tofu. ? Avoid fatty meat, cured and processed meat, or chicken with skin. ? Avoid pre-made or processed food.  Eat less than 1,500 mg of salt each day.  Do not drink alcohol if: ? Your doctor tells you not to drink. ? You are pregnant, may be pregnant, or are planning to become pregnant.  If you drink alcohol: ? Limit how much you use to:  0-1 drink a day for women.  0-2 drinks a day for men. ? Be aware of how much alcohol is in your drink. In the [place] one drink equals one 12 oz bottle of beer (355 mL), one 5  oz glass of wine (148 mL), or one 1 oz glass of hard liquor (44 mL). Lifestyle   Work with your doctor to stay at a healthy weight or to lose weight. Ask your doctor what the best weight is for you.  Get at least 30 minutes of exercise most days of the week. This may include walking, swimming, or biking.  Get at least 30 minutes of exercise that strengthens your muscles (resistance exercise) at least 3 days a week. This may include lifting weights or doing Pilates.  Do not use  any products that contain nicotine or tobacco, such as cigarettes, e-cigarettes, and chewing tobacco. If you need help quitting, ask your doctor.  Check your blood pressure at home as told by your doctor.  Keep all follow-up visits as told by your doctor. This is important. Medicines  Take over-the-counter and prescription medicines only as told by your doctor. Follow directions carefully.  Do not skip doses of blood pressure medicine. The medicine does not work as well if you skip doses. Skipping doses also puts you at risk for problems.  Ask your doctor about side effects or reactions to medicines that you should watch for. Contact a doctor if you:  Think you are having a reaction to the medicine you are taking.  Have headaches that keep coming back (recurring).  Feel dizzy.  Have swelling in your ankles.  Have trouble with your vision. Get help right away if you:  Get a very bad headache.  Start to feel mixed up (confused).  Feel weak or numb.  Feel faint.  Have very bad pain in your: ? Chest. ? Belly (abdomen).  Throw up more than once.  Have trouble breathing. Summary  Hypertension is another name for high blood pressure.  High blood pressure forces your heart to work harder to pump blood.  For most people, a normal blood pressure is less than 120/80.  Making healthy choices can help lower blood pressure. If your blood pressure does not get lower with healthy choices, you may need to take medicine. This information is not intended to replace advice given to you by your health care provider. Make sure you discuss any questions you have with your health care provider. Document Revised: 11/22/2017 Document Reviewed: 11/22/2017 Elsevier Patient Education  2020 [employer].

## 2020-02-18 NOTE — Assessment & Plan Note (Signed)
Hypertension not well controlled.  Patient seen by cardiology yesterday.  Patient started on Cardura 4 mg tablet by mouth daily.  Patient to follow-up in 4 weeks with cardiology. Advised patient to take a blood pressure log for 1 week and call in blood pressure log.  Starting new medication dose. Follow-up with worsening or unresolved symptoms. Provided education with printed handouts given.

## 2020-02-18 NOTE — Progress Notes (Signed)
Established Patient Office Visit  Subjective:  Patient ID: Bradley Torres, male    DOB: [birth year]  Age: 59 y.o. MRN: [id number]  CC:  Chief Complaint  Patient presents with  . Hypertension    6 week follow up    HPI Bradley Torres is a 59 year old male who presents for follow up of hypertension. Patient was diagnosed in 01/23/2014. The patient is tolerating the medication well without side effects. Compliance with treatment has been fair; including taking medication as directed, tries to maintains a healthy diet and no regular exercise regimen,  and following up as directed. Current blood pressure medication Cardura 4 mg tablet by mouth daily, nebivolol 20 mg tablet, 2 tablets daily.  Past Medical History:  Diagnosis Date  . CAD (coronary artery disease)    a. 12/2017: cath showing 10% Proximal-LAD stenosis with no significant obstructive disease and LVEDP mildly elevated at 18 mm Hg.   Marland Kitchen Chronic back pain   . Diabetes mellitus, type 2 ([place])   . DVT (deep venous thrombosis) ([place])   . Essential hypertension   . Morbid obesity ([place])   . Panic disorder   . Prediabetes   . Shoulder pain, left    Following fall  . Vitamin D deficiency     Past Surgical History:  Procedure Laterality Date  . Ankle fracture sugery Left   . COLONOSCOPY WITH PROPOFOL N/A 05/29/2017   Dr. [name]: Multiple tubular adenomas removed, prep inadequate.  Short interval surveillance colonoscopy recommended in 6 months  . LEFT HEART CATH AND CORONARY ANGIOGRAPHY N/A 01/23/2018   Procedure: LEFT HEART CATH AND CORONARY ANGIOGRAPHY;  Surgeon: Troy Sine, MD;  Location: [facility];  Service: Cardiovascular;  Laterality: N/A;  . LUMBAR SPINE SURGERY     fusion  . POLYPECTOMY  05/29/2017   Procedure: POLYPECTOMY;  Surgeon: Daneil Dolin, MD;  Location: [facility];  Service: Endoscopy;;  ascending colon polyp x5-cs transverse colon polyp x4- cs descending colon polyp x1- hs sigmoid colon polyp  x1 -hs rectal polyp x1- hs  . TONSILLECTOMY      Family History  Problem Relation Age of Onset  . Colon cancer Mother        died at age 77  . Heart attack Father   . Hepatitis Sister 42       Autoimmune  . Heart attack Paternal Grandfather     Social History   Socioeconomic History  . Marital status: Single    Spouse name: Not on file  . Number of children: Not on file  . Years of education: Not on file  . Highest education level: Not on file  Occupational History  . Not on file  Tobacco Use  . Smoking status: Never Smoker  . Smokeless tobacco: Never Used  Vaping Use  . Vaping Use: Never used  Substance and Sexual Activity  . Alcohol use: Yes    Comment: occ  . Drug use: Not Currently    Types: Marijuana    Comment: daily; 04/19/19 none past few weeks d/t problems breathing  . Sexual activity: Not Currently    Birth control/protection: None  Other Topics Concern  . Not on file  Social History Narrative  . Not on file   Social Determinants of Health   Financial Resource Strain:   . Difficulty of Paying Living Expenses: Not on file  Food Insecurity: No Food Insecurity  . Worried About Charity fundraiser in the Last Year:  Never true  . Ran Out of Food in the Last Year: Never true  Transportation Needs: No Transportation Needs  . Lack of Transportation (Medical): No  . Lack of Transportation (Non-Medical): No  Physical Activity:   . Days of Exercise per Week: Not on file  . Minutes of Exercise per Session: Not on file  Stress: Stress Concern Present  . Feeling of Stress : Rather much  Social Connections: Unknown  . Frequency of Communication with Friends and Family: More than three times a week  . Frequency of Social Gatherings with Friends and Family: More than three times a week  . Attends Religious Services: Not on file  . Active Member of Clubs or Organizations: Not on file  . Attends Archivist Meetings: Not on file  . Marital Status: Not on  file  Intimate Partner Violence:   . Fear of Current or Ex-Partner: Not on file  . Emotionally Abused: Not on file  . Physically Abused: Not on file  . Sexually Abused: Not on file    Outpatient Medications Prior to Visit  Medication Sig Dispense Refill  . blood glucose meter kit and supplies KIT Dispense based on patient and insurance preference. Use up to four times daily as directed. (FOR ICD-9 250.00, 250.01). 1 each 0  . Blood Glucose Monitoring Suppl ([place]) w/Device KIT USE TO TEST BLOOD SUGAR DAILY AS DIRECTED. DX: 11.9 1 kit 0  . doxazosin (CARDURA) 4 MG tablet Take 1 tablet (4 mg total) by mouth daily. 90 tablet 3  . glucose blood (ONETOUCH VERIO) test strip USE TO TEST BLOOD SUGAR DAILY AS DIRECTED. DX: 11.9 100 each 12  . Nebivolol HCl (BYSTOLIC) 20 MG TABS TAKE 2 TABLETS ONCE A DAY 180 tablet 3  . OneTouch Delica Lancets 68E MISC USE TO TEST BLOOD SUGAR DAILY AS DIRECTED. DX: 11.9 100 each 11  . oxyCODONE-acetaminophen (PERCOCET) 10-325 MG tablet Take 1 tablet by mouth 3 (three) times daily as needed.    . rivaroxaban (XARELTO) 20 MG TABS tablet TAKE 1 TABLET BY MOUTH DAILY WITH SUPPER 30 tablet 11  . VITAMIN D PO Take by mouth once a week.    . dapagliflozin propanediol (FARXIGA) 10 MG TABS tablet Take 1 tablet (10 mg total) by mouth daily before breakfast. 30 tablet 3   No facility-administered medications prior to visit.    Allergies  Allergen Reactions  . Lisinopril Other (See Comments)    wheezing  . Ibuprofen Other (See Comments)    Rectal bleed  . Spironolactone Hives  . Amlodipine Itching and Other (See Comments)    Leg pain.  . Diflucan [Fluconazole] Rash  . Hctz [Hydrochlorothiazide] Other (See Comments)    Caused gout flares.  . Losartan Rash    ROS Review of Systems  Neurological: Negative for light-headedness and headaches.  All other systems reviewed and are negative.     Objective:    Physical Exam Vitals reviewed.    Constitutional:      Appearance: He is obese.  HENT:     Head: Normocephalic.     Nose: Nose normal.  Eyes:     Conjunctiva/sclera: Conjunctivae normal.  Cardiovascular:     Rate and Rhythm: Normal rate and regular rhythm.     Pulses: Normal pulses.     Heart sounds: Normal heart sounds.  Pulmonary:     Effort: Pulmonary effort is normal.     Breath sounds: Normal breath sounds.  Abdominal:  General: Bowel sounds are normal.  Skin:    General: Skin is warm.  Neurological:     Mental Status: He is alert and oriented to person, place, and time.     BP (!) 171/93   Pulse 70   Temp 97.8 F (36.6 C) (Temporal)   Ht _0  (1.854 m)   Wt (!) 423 lb 9.6 oz (192.1 kg)   SpO2 93%   BMI 55.89 kg/m  Wt Readings from Last 3 Encounters:  02/18/20 (!) 423 lb 9.6 oz (192.1 kg)  02/17/20 (!) 423 lb (191.9 kg)  01/13/20 (!) 409 lb (185.5 kg)     Health Maintenance Due  Topic Date Due  . FOOT EXAM  Never done  . OPHTHALMOLOGY EXAM  Never done    There are no preventive care reminders to display for this patient.  Lab Results  Component Value Date   TSH 2.840 05/13/2019   Lab Results  Component Value Date   WBC 10.2 12/16/2019   HGB 13.2 (A) 12/16/2019   HCT 42 12/16/2019   MCV 97.5 04/10/2019   PLT 272 12/16/2019   Lab Results  Component Value Date   NA 139 12/16/2019   K 4.6 12/16/2019   CO2 27 (A) 12/16/2019   GLUCOSE 141 (H) 04/10/2019   BUN 21 12/16/2019   CREATININE 1.3 12/16/2019   BILITOT 0.7 04/10/2019   ALKPHOS 75 12/16/2019   AST 18 12/16/2019   ALT 19 12/16/2019   PROT 7.3 04/10/2019   ALBUMIN 3.5 12/16/2019   CALCIUM 9.0 12/16/2019   ANIONGAP 10 04/10/2019   Lab Results  Component Value Date   CHOL 183 01/07/2019   Lab Results  Component Value Date   HDL 39 (L) 01/07/2019   Lab Results  Component Value Date   LDLCALC 123 (H) 01/07/2019   Lab Results  Component Value Date   TRIG 114 01/07/2019   Lab Results  Component Value  Date   CHOLHDL 4.7 01/07/2019   Lab Results  Component Value Date   HGBA1C 7.7 (H) 12/25/2019      Assessment & Plan:   Problem List Items Addressed This Visit      Cardiovascular and Mediastinum   Benign essential HTN    Hypertension not well controlled.  Patient seen by cardiology yesterday.  Patient started on Cardura 4 mg tablet by mouth daily.  Patient to follow-up in 4 weeks with cardiology. Advised patient to take a blood pressure log for 1 week and call in blood pressure log.  Starting new medication dose. Follow-up with worsening or unresolved symptoms. Provided education with printed handouts given.         Meds ordered this encounter  Medications  . dapagliflozin propanediol (FARXIGA) 10 MG TABS tablet    Sig: Take 1 tablet (10 mg total) by mouth daily before breakfast.    Dispense:  90 tablet    Refill:  1    Follow-up: Return in about 4 weeks (around 03/17/2020).    Ivy Lynn, NP

## 2020-02-21 DIAGNOSIS — E119 Type 2 diabetes mellitus without complications: Secondary | ICD-10-CM | POA: Diagnosis not present

## 2020-02-21 DIAGNOSIS — M539 Dorsopathy, unspecified: Secondary | ICD-10-CM | POA: Diagnosis not present

## 2020-02-21 DIAGNOSIS — F32A Depression, unspecified: Secondary | ICD-10-CM | POA: Diagnosis not present

## 2020-02-21 DIAGNOSIS — Z79899 Other long term (current) drug therapy: Secondary | ICD-10-CM | POA: Diagnosis not present

## 2020-02-21 DIAGNOSIS — R3989 Other symptoms and signs involving the genitourinary system: Secondary | ICD-10-CM | POA: Diagnosis not present

## 2020-02-26 DIAGNOSIS — G4733 Obstructive sleep apnea (adult) (pediatric): Secondary | ICD-10-CM | POA: Diagnosis not present

## 2020-02-27 ENCOUNTER — Telehealth: Payer: Self-pay | Admitting: Cardiovascular Disease

## 2020-02-27 NOTE — Telephone Encounter (Signed)
I called patient and gave recommendation to go to the ED. He states he is not going to do that. He wants to come to the office. I tried to explain to him why we wanted him to go to the ED but he again said he is not going to do that.   Rip Harbour, do you know if there is anyway we could at least get him an office visit with Dr. [name] or an APP in the next couple of days?

## 2020-02-27 NOTE — Telephone Encounter (Signed)
Patient is returning call.  °

## 2020-02-27 NOTE — Telephone Encounter (Signed)
Attempted to call patient to discuss below concerns. Left message to call back.   Darreld Mclean, PA-C 02/27/2020 1:17 PM

## 2020-02-27 NOTE — Telephone Encounter (Signed)
Agree.  Go to the ED.

## 2020-02-27 NOTE — Telephone Encounter (Signed)
No available visits, discussed further with Dr [name] who agreed best place for evaluation was ED Left message to call back

## 2020-02-27 NOTE — Telephone Encounter (Signed)
Called and spoke with patient. He reports significant edema since increase in Nebivolol and Cardura at office visit last week. He states he is swollen everywhere. He notes back pain which he is not sure if it is due to the swelling or underlying issue. He states he is so swollen it hurts to move. He notes some shortness of breath due to the edema as well as some worsening PND and stable orthopnea. He states his weight was 448 lbs at pain management clinic yesterday which is up 25 lbs from where he was in our office and his PCP's office last week. No chest pain. I discussed that if patient has gained 25lbs in a weeks and he is so swollen that it hurts to move he may need to be seen in the ED for further evaluation. He is convinced that edema is due to the Cardura and Nebivolol. Although edema is a side effect of both medications (3-4% of the time with Cardura and 1% of the time with Nebivolol per UpToDate), a 25lb weight gain in 1 weeks seems excessive. I will route note to Dr. [name] for her input and further recommendations.   Darreld Mclean, PA-C 02/27/2020 2:01 PM

## 2020-02-27 NOTE — Telephone Encounter (Signed)
Pt c/o medication issue:  1. Name of Medication: Nebivolol HCl (BYSTOLIC) 20 MG TABS dapagliflozin propanediol (FARXIGA) 10 MG TABS tablet  2. How are you currently taking this medication (dosage and times per day)?  Taking them as prescribed   3. Are you having a reaction (difficulty breathing--STAT)?  Making him swell up really bad, making his back her really bad, it has made him gain weight, a little bit of difficulty breathing   4. What is your medication issue?  Patient states he is not sure which medication is causing his issues but these are the two new ones that he started.   Pt c/o swelling: STAT is pt has developed SOB within 24 hours  1) How much weight have you gained and in what time span?  Not positive over what time span but since he started medication he has gained 50 lbs. From 402 lbs to 448 lbs   2) If swelling, where is the swelling located?  Everywhere, can't hardly move   3) Are you currently taking a fluid pill?  No   4) Are you currently SOB?  Not too bad, from where he's swollen makes it hard to breath   5) Do you have a log of your daily weights (if so, list)?  No   6) Have you gained 3 pounds in a day or 5 pounds in a week?  n/a  7) Have you traveled recently?  No

## 2020-03-02 ENCOUNTER — Encounter: Payer: Self-pay | Admitting: Neurology

## 2020-03-02 ENCOUNTER — Ambulatory Visit: Payer: Self-pay | Admitting: Neurology

## 2020-03-09 ENCOUNTER — Telehealth: Payer: Self-pay | Admitting: Cardiovascular Disease

## 2020-03-09 NOTE — Telephone Encounter (Signed)
Patient called in today asking to speak with Dr [name], message sent  He is aware Dr [name] out of office until end of week

## 2020-03-09 NOTE — Telephone Encounter (Signed)
Spoke with patient and she stated that he stopped his Bystolic secondary to weight gain He stopped Bystolic and swelling improved Per patient he needs to speak with Dr [name] and she needs to change his medications Patient aware Dr [name] not in office until later this week

## 2020-03-09 NOTE — Telephone Encounter (Signed)
Patient called and asked to speak specifically to Dr. [name] as she is the only one who understands his situation. Advised the patient she was not in the office today and he just said he would wait for a call back from her

## 2020-03-12 ENCOUNTER — Telehealth: Payer: Medicaid Other

## 2020-03-12 NOTE — Telephone Encounter (Signed)
Attempted to call patient and there was no response.  I left a voicemail.  Darrek Leasure C. [name], MD, [facility] 03/12/2020 6:11 PM

## 2020-03-13 ENCOUNTER — Telehealth: Payer: Self-pay | Admitting: Cardiovascular Disease

## 2020-03-13 ENCOUNTER — Telehealth: Payer: Self-pay | Admitting: *Deleted

## 2020-03-13 MED ORDER — HYDRALAZINE HCL 50 MG PO TABS: 50.0000 mg | ORAL_TABLET | Freq: Two times a day (BID) | ORAL | 5 refills | Status: DC

## 2020-03-13 NOTE — Telephone Encounter (Signed)
Spoke with Mr. Ungaro.  He had edema on Bystolic and stopped it.  He is taking lisinopril and has some wheezing but the swelling is better.  BP 170s. We will stop both lisinopril and Bystolic.  Start hydralazine 50mg  bid.  Zohal Reny C. [name], MD, [facility]  03/13/2020 6:17 PM

## 2020-03-13 NOTE — Telephone Encounter (Signed)
Dr [name] spoke with patient via phone Will start Hydralazine 50 mg twice a day  Rx sent to pharmacy

## 2020-03-17 ENCOUNTER — Ambulatory Visit (INDEPENDENT_AMBULATORY_CARE_PROVIDER_SITE_OTHER): Payer: Medicaid Other | Admitting: Pharmacist Clinician (PhC)/ Clinical Pharmacy Specialist

## 2020-03-17 ENCOUNTER — Other Ambulatory Visit: Payer: Self-pay

## 2020-03-17 DIAGNOSIS — I10 Essential (primary) hypertension: Secondary | ICD-10-CM | POA: Diagnosis not present

## 2020-03-17 MED ORDER — HYDRALAZINE HCL 50 MG PO TABS: 50.0000 mg | ORAL_TABLET | Freq: Three times a day (TID) | ORAL | 3 refills | Status: DC

## 2020-03-17 NOTE — Progress Notes (Signed)
03/25/2020 Bradley Torres [birth year] [id number]   HPI:  Bradley Torres is a 59 y.o. male patient of Bradley Torres, with a PMH below who presents today for advanced hypertension clinic follow up.  Patient notes problems with hypertension going back about 10 years, and has struggled to control it.  He has been sensitive to multiple medications and reluctant to use higher doses or multiple doses per day.  At his last visit with Bradley. Oval Torres his nebivolol was increased to 40 mg and doxazosin to 4 mg.  About 10 days later he called to report overall edema and back pain.  Stated his weight was up to 448 (25 pounds up from Bradley Torres).  When he called in it was recommended he go to the ED for evaluation, but patient refused.  He stopped both nebivolol and doxazosin.  Bradley. Oval Torres then added hydralazine 50 mg bid on December 17.     He returns today for follow up.  He is frustrated at not seeing Bradley. Oval Torres today and I spent some time explaining how our hypertension clinic works.  He tells me that he will stop hydrochlorothiazide today, as his swelling has gone down significantly.  He is doing well with the hydralazine so far, but has only been taking for 3-4 days.  He also notes that historically he did well with valsartan and one other medication.  They were prescribed by a previous PA - Bradley Torres.  Looking back in his chart he tood valsartan hct 160/12.5 mg daily along with bisoprolol hct 10/6.25 mg.  After a year or so on this combination, the valsartan hct was switced to lisinopril hct 20/25 mg daily.  Cannot find in the chart reason for this change.    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  CKD Stage 3 GFR 56  OSA      Blood Pressure Goal:  130/80  Current Medications:  hydralazine 50 mg bid  Family Hx: father died from assumed MI/stroke close to 40 - was found deceased in recliner chair; mother died from colon cancer (3); pgf died sudden cardiac death; sister died from autoimmune hepatitis; brother healthy;   Social Hx: no tobacco, no alcohol, Mt Dew - 2-3 cans as well as sweet tea 2-3 glasses per day  Diet: Plate lunch - steak , gravy, pot roast; veggies most days; pinto beans; not much breakfast; cookies/crackers/chips for snacks - has cut back   Exercise: none  Home BP readings: no home readings, checks couple of times each week  Intolerances: lisinopril - cough; nebivolol - edema, spironolactone - hives; losartan - rash; amlodipine - leg pain; hctz - gout flare  Labs: 9/21:  Na 139, K 4.6, Glu 236, BUN 21, SCr 1.3; Vitamin D 4.5  (stated taking supplement to PCP but did not know dose)  Wt Readings from Last 3 Encounters:  03/17/20 (!) 404 lb (183.3 kg)  02/18/20 (!) 423 lb 9.6 oz (192.1 kg)  02/17/20 (!) 423 lb (191.9 kg)   BP Readings from Last 3 Encounters:  03/17/20 (!) 144/96  02/18/20 (!) 171/93  02/17/20 140/82   Pulse Readings from Last 3 Encounters:  03/17/20 (!) 107  02/18/20 70  02/17/20 68    Current Outpatient Medications  Medication Sig Dispense Refill  . blood glucose meter kit and supplies KIT Dispense based on patient and insurance preference. Use up to four times daily as directed. (FOR ICD-9 250.00, 250.01). 1 each 0  . Blood Glucose Monitoring Suppl (Bradley Torres) w/Device KIT USE TO TEST BLOOD SUGAR DAILY AS DIRECTED. DX: 11.9 1 kit 0  . dapagliflozin propanediol (FARXIGA) 10 MG TABS tablet Take 1 tablet (10 mg total) by mouth daily before breakfast. 90 tablet 1  . doxazosin (CARDURA) 4 MG tablet Take 1 tablet (4 mg total) by  mouth daily. 90 tablet 3  . glucose blood (ONETOUCH VERIO) test strip USE TO TEST BLOOD SUGAR DAILY AS DIRECTED. DX: 11.9 100 each 12  . OneTouch Delica Lancets 09U MISC USE TO TEST BLOOD SUGAR DAILY AS DIRECTED. DX: 11.9 100 each 11  . oxyCODONE-acetaminophen (PERCOCET) 10-325 MG tablet Take 1 tablet by mouth 3 (three) times daily as needed.    . rivaroxaban (XARELTO) 20 MG TABS tablet TAKE 1 TABLET BY MOUTH DAILY WITH SUPPER 30 tablet 11  . hydrALAZINE (APRESOLINE) 50 MG tablet Take 1 tablet (50 mg total) by mouth 3 (three) times daily. 90 tablet 3   No current facility-administered medications for this visit.    Allergies  Allergen Reactions  . Lisinopril Other (See Comments)    wheezing  . Bystolic [Nebivolol Hcl] Swelling  . Ibuprofen Other (See Comments)    Rectal bleed  . Spironolactone Hives  . Amlodipine Itching and Other (See Comments)    Leg pain.  . Diflucan [Fluconazole] Rash  . Hctz [Hydrochlorothiazide] Other (See Comments)    Caused gout flares.  . Losartan Rash    Past Medical History:  Diagnosis Date  . CAD (coronary artery disease)    a. 12/2017: cath showing 10% Proximal-LAD stenosis with no significant obstructive disease and LVEDP mildly elevated at 18 mm Hg.   Marland Kitchen Chronic back pain   . Diabetes mellitus, type 2 ([name])   . DVT (deep venous thrombosis) ([name])   . Essential hypertension   . Morbid obesity ([name])   . Panic disorder   . Prediabetes   . Shoulder pain, left    Following fall  . Vitamin D deficiency     Blood pressure (!) 144/96, pulse (!) 107, resp. rate 17, height 6' (1.829 m), weight (!) 404 lb (183.3 kg), SpO2 93 %.  Benign essential HTN Patient with essential hypertension and intolerance to multiple medications.  We had a long discussion regarding nebivolol and doxazosin - explained that while one of those may be the culprit for his edema, probably not both.  Also we are becoming somewhat limited on our choice of medications.  Based on how  he is taking the hydralazine, we can increase that to 3 times daily.  He takes a dose when he gets up (around noon) and one around 12 am- 2 am before going to sleep.  Will have him take another dose when he eats his biggest meal, around 5-6 pm.  He is agreeable to this today, and I will do some further research to determine what he was taking from Bradley Torres that he felt worked well.  Explained in detail how he needs to be more willing to work with [place] in treating his blood  pressure.  He needs to check home readings and write them down to bring in - explained need to treat home readings as much as single office visit readings.  He will return in a month for follow up and is to bring home readings with her.    Tommy Medal PharmD CPP [facility] [address] [place], [place] [address] [phone]

## 2020-03-17 NOTE — Patient Instructions (Addendum)
Return for a a follow up appointment January 18 at 3 pm  Check your blood pressure at home daily (at least 3-4 times each week) and keep record of the readings.  Take your BP meds as follows:  Increase hydralazine to 50 mg three times daily   Bring all of your meds, your BP cuff and your record of home blood pressures to your next appointment.  Exercise as you're able, try to walk approximately 30 minutes per day.  Keep salt intake to a minimum, especially watch canned and prepared boxed foods.  Eat more fresh fruits and vegetables and fewer canned items.  Avoid eating in fast food restaurants.    HOW TO TAKE YOUR BLOOD PRESSURE: . Rest 5 minutes before taking your blood pressure. .  Don't smoke or drink caffeinated beverages for at least 30 minutes before. . Take your blood pressure before (not after) you eat. . Sit comfortably with your back supported and both feet on the floor (don't cross your legs). . Elevate your arm to heart level on a table or a desk. . Use the proper sized cuff. It should fit smoothly and snugly around your bare upper arm. There should be enough room to slip a fingertip under the cuff. The bottom edge of the cuff should be 1 inch above the crease of the elbow. . Ideally, take 3 measurements at one sitting and record the average.

## 2020-03-18 ENCOUNTER — Encounter: Payer: Self-pay | Admitting: Pharmacist Clinician (PhC)/ Clinical Pharmacy Specialist

## 2020-03-23 DIAGNOSIS — M539 Dorsopathy, unspecified: Secondary | ICD-10-CM | POA: Diagnosis not present

## 2020-03-23 DIAGNOSIS — E119 Type 2 diabetes mellitus without complications: Secondary | ICD-10-CM | POA: Diagnosis not present

## 2020-03-23 DIAGNOSIS — G4700 Insomnia, unspecified: Secondary | ICD-10-CM | POA: Diagnosis not present

## 2020-03-23 DIAGNOSIS — Z79899 Other long term (current) drug therapy: Secondary | ICD-10-CM | POA: Diagnosis not present

## 2020-03-23 DIAGNOSIS — F32A Depression, unspecified: Secondary | ICD-10-CM | POA: Diagnosis not present

## 2020-03-25 ENCOUNTER — Encounter: Payer: Self-pay | Admitting: Pharmacist Clinician (PhC)/ Clinical Pharmacy Specialist

## 2020-03-25 NOTE — Assessment & Plan Note (Signed)
Patient with essential hypertension and intolerance to multiple medications.  We had a long discussion regarding nebivolol and doxazosin - explained that while one of those may be the culprit for his edema, probably not both.  Also we are becoming somewhat limited on our choice of medications.  Based on how he is taking the hydralazine, we can increase that to 3 times daily.  He takes a dose when he gets up (around noon) and one around 12 am- 2 am before going to sleep.  Will have him take another dose when he eats his biggest meal, around 5-6 pm.  He is agreeable to this today, and I will do some further research to determine what he was taking from Prudy Feeler that he felt worked well.  Explained in detail how he needs to be more willing to work with [place] in treating his blood pressure.  He needs to check home readings and write them down to bring in - explained need to treat home readings as much as single office visit readings.  He will return in a month for follow up and is to bring home readings with her.

## 2020-04-02 ENCOUNTER — Ambulatory Visit: Payer: Medicaid Other | Admitting: Family Medicine

## 2020-04-13 ENCOUNTER — Ambulatory Visit: Payer: Medicaid Other | Admitting: Gastroenterology

## 2020-04-13 ENCOUNTER — Telehealth: Payer: Self-pay

## 2020-04-13 NOTE — Telephone Encounter (Signed)
LMOM TO R/S ICE STORM

## 2020-04-14 ENCOUNTER — Ambulatory Visit: Payer: Medicaid Other

## 2020-04-17 ENCOUNTER — Telehealth: Payer: Medicaid Other

## 2020-04-22 DIAGNOSIS — E119 Type 2 diabetes mellitus without complications: Secondary | ICD-10-CM | POA: Diagnosis not present

## 2020-04-22 DIAGNOSIS — M539 Dorsopathy, unspecified: Secondary | ICD-10-CM | POA: Diagnosis not present

## 2020-04-22 DIAGNOSIS — Z79899 Other long term (current) drug therapy: Secondary | ICD-10-CM | POA: Diagnosis not present

## 2020-04-22 DIAGNOSIS — F32A Depression, unspecified: Secondary | ICD-10-CM | POA: Diagnosis not present

## 2020-04-29 ENCOUNTER — Ambulatory Visit (INDEPENDENT_AMBULATORY_CARE_PROVIDER_SITE_OTHER): Payer: Medicaid Other | Admitting: Pharmacist Clinician (PhC)/ Clinical Pharmacy Specialist

## 2020-04-29 ENCOUNTER — Other Ambulatory Visit: Payer: Self-pay

## 2020-04-29 DIAGNOSIS — I10 Essential (primary) hypertension: Secondary | ICD-10-CM

## 2020-04-29 MED ORDER — VALSARTAN 320 MG PO TABS: 320.0000 mg | ORAL_TABLET | Freq: Every day | ORAL | 6 refills | Status: DC

## 2020-04-29 NOTE — Patient Instructions (Signed)
Return for a a follow up appointment Thursday Feb 24 at 1:30 pm  Check your blood pressure at home daily (if able) and keep record of the readings.  Take your BP meds as follows:  Add valsartan 320 mg once daily.  Take this at the same time as the hydrochlorothiazide.   Continue with the hydralazine twice daily.  Bring all of your meds, your BP cuff and your record of home blood pressures to your next appointment.  Exercise as you're able, try to walk approximately 30 minutes per day.  Keep salt intake to a minimum, especially watch canned and prepared boxed foods.  Eat more fresh fruits and vegetables and fewer canned items.  Avoid eating in fast food restaurants.    HOW TO TAKE YOUR BLOOD PRESSURE: . Rest 5 minutes before taking your blood pressure. .  Don't smoke or drink caffeinated beverages for at least 30 minutes before. . Take your blood pressure before (not after) you eat. . Sit comfortably with your back supported and both feet on the floor (don't cross your legs). . Elevate your arm to heart level on a table or a desk. . Use the proper sized cuff. It should fit smoothly and snugly around your bare upper arm. There should be enough room to slip a fingertip under the cuff. The bottom edge of the cuff should be 1 inch above the crease of the elbow. . Ideally, take 3 measurements at one sitting and record the average.

## 2020-04-29 NOTE — Progress Notes (Unsigned)
04/30/2020 Bradley Torres 1960/12/20 [id number]   HPI:  Bradley Torres is a 60 y.o. male patient of Dr [name], with a PMH below who presents today for advanced hypertension clinic follow up.  Patient notes problems with hypertension going back about 10 years, and has struggled to control it.  He has been sensitive to multiple medications and reluctant to use higher doses or multiple doses per day.  At his last visit with Dr. [name] his nebivolol was increased to 40 mg and doxazosin to 4 mg.  About 10 days later he called to report overall edema and back pain.  Stated his weight was up to 448 (25 pounds up from [place]).  When he called in it was recommended he go to the ED for evaluation, but patient refused.  He stopped both nebivolol and doxazosin.  Dr. [name] then added hydralazine 50 mg bid on December 17.   At his first ADV HTN follow up he was rather angry that he was not seeing Dr. [name] again and I spent much of the visit explaining how our clinic worked.  He was finally (somewhat) appeased.  At that time we increased his hydralazine to 50 mg tid.  He was sent a home BP cuff thru the social work team.   Patient called about 10 days ago, again somewhat angry, as the home cuff was not working and the few times he had been out (pharmacy or MD office) his pressure remained high (see below).   He was asked to keep appointment for today.    Today he comes in for follow up.  He stopped taking the mid-day dose of hydralazine, stating was too difficult to remember.  Would prefer to only take meds once daily, but has been doing hydralazine twice daily without too much issue.  Patient still frustrated at lack of BP control.  We had a frank discussion about this.  I explained that he has stopped multiple medications due to sensitivities, which leaves [place] fewer to work with.  I also stressed that it often takes 3 or more medications to get pressure under control, and he has only been taking one, and not  on the recommended prescribing schedule.  I also stressed that he has to make lifestyle modifications that will help lower pressure, otherwise he will continue to need more medication as he becomes more sedentary, doesn't lose weight or adjust caffeine or sodium intake.   Since we saw him last his primary MD added hydrochlorothiazide 25 mg to help with edema.  Patient reports tolerating without concern, he started it just under a week ago, and has yet to see any appreciable drop in pressure.    Patient also brings home BP cuff with him.  He received this thru our social work team, however it has not been working.  We tested the cuff, which will only inflate to about 70 mmHg before reading error and deflating.   I will reach out to social workers about replacing this.    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, now  on Xarelto  CKD Stage 3 GFR 56  OSA      Blood Pressure Goal:  130/80  Current Medications:  hydralazine 50 mg bid  Family Hx: father died from assumed MI/stroke close to 34 - was found deceased in recliner chair; mother died from colon cancer (36); pgf died sudden cardiac death; sister died from autoimmune hepatitis; brother healthy;   Social Hx: no tobacco, no alcohol, Mt Dew - 2-3 cans as well as sweet tea 2-3 glasses per day  Diet: eats one meal per day - Plate lunch - steak , gravy, pot roast; veggies most days; pinto beans; not much breakfast; cookies/crackers/chips for snacks - states he has cut back from what he used to eat  Exercise: none  Home BP readings: checked at various places  around town [employer], MD offices).  He brings in 8 readings on various slips of paper, they averaged 162/101; HR 83  Intolerances: lisinopril - cough; nebivolol - edema, spironolactone - hives; losartan - rash; amlodipine - leg pain; hctz - gout flare  Labs: 9/21:  Na 139, K 4.6, Glu 236, BUN 21, SCr 1.3; Vitamin D 4.5  (stated taking supplement to PCP but did not know dose)  Wt Readings from Last 3 Encounters:  03/17/20 (!) 404 lb (183.3 kg)  02/18/20 (!) 423 lb 9.6 oz (192.1 kg)  02/17/20 (!) 423 lb (191.9 kg)   BP Readings from Last 3 Encounters:  04/29/20 (!) 161/90  03/17/20 (!) 144/96  02/18/20 (!) 171/93   Pulse Readings from Last 3 Encounters:  03/17/20 (!) 107  02/18/20 70  02/17/20 68    Current Outpatient Medications  Medication Sig Dispense Refill  . dapagliflozin propanediol (FARXIGA) 10 MG TABS tablet Take 1 tablet (10 mg total) by mouth daily before breakfast. 90 tablet 1  . hydrALAZINE (APRESOLINE) 50 MG tablet Take 1 tablet (50 mg total) by mouth 3 (three) times daily. 90 tablet 3  . rivaroxaban (XARELTO) 20 MG TABS tablet TAKE 1 TABLET BY MOUTH DAILY WITH SUPPER 30 tablet 11  . valsartan (DIOVAN) 320 MG tablet Take 1 tablet (320 mg total) by mouth daily. 30 tablet 6  . blood glucose meter kit and supplies KIT Dispense based on patient and insurance preference. Use up to four times daily as directed. (FOR ICD-9 250.00, 250.01). 1 each 0  . Blood Glucose Monitoring Suppl ([place]) w/Device KIT USE TO TEST BLOOD SUGAR DAILY AS DIRECTED. DX: 11.9 1 kit 0  . glucose blood (ONETOUCH VERIO) test strip USE TO TEST BLOOD SUGAR DAILY AS DIRECTED. DX: 11.9 100 each 12  . OneTouch Delica Lancets 03Y MISC USE TO TEST BLOOD SUGAR DAILY AS DIRECTED. DX: 11.9 100 each 11  . oxyCODONE-acetaminophen (PERCOCET) 10-325 MG tablet Take 1 tablet by mouth 3 (three) times daily as needed.     No current facility-administered medications for this visit.    Allergies   Allergen Reactions  . Lisinopril Other (See Comments)    wheezing  . Bystolic [Nebivolol Hcl] Swelling  . Ibuprofen Other (See Comments)    Rectal bleed  . Spironolactone Hives  . Amlodipine Itching and Other (See Comments)    Leg pain.  . Diflucan [Fluconazole] Rash  . Hctz [Hydrochlorothiazide] Other (See Comments)    Caused gout flares.  . Losartan Rash    Past Medical History:  Diagnosis Date  . CAD (coronary artery disease)    a. 12/2017: cath showing 10% Proximal-LAD stenosis with no significant obstructive disease  and LVEDP mildly elevated at 18 mm Hg.   Marland Kitchen Chronic back pain   . Diabetes mellitus, type 2 ([place])   . DVT (deep venous thrombosis) ([place])   . Essential hypertension   . Morbid obesity ([place])   . Panic disorder   . Prediabetes   . Shoulder pain, left    Following fall  . Vitamin D deficiency     Blood pressure (!) 161/90.  Benign essential HTN Patient with essential hypertension, currently not at goal on hydralazine and hydrochlorothiazide.  After a frank discussion about treatment options, we will start him back on valsartan 320 mg daily.  He was on this previously, but discontinued at time of 2018 recall and never put back on.  We will look into getting him a new device.  In the meantime he should take valsartan at same time as hctz and first dose of hydralazine every day.  He should continue with second dose of hydralazine at bedtime.  We will see him back in the office in 3 weeks for follow up, at which time we will repeat labs for metabolic panel.    Tommy Medal PharmD CPP [facility] [address] [place], [place] [address] [phone]

## 2020-04-30 ENCOUNTER — Encounter: Payer: Self-pay | Admitting: Pharmacist Clinician (PhC)/ Clinical Pharmacy Specialist

## 2020-04-30 NOTE — Assessment & Plan Note (Addendum)
Patient with essential hypertension, currently not at goal on hydralazine and hydrochlorothiazide.  After a frank discussion about treatment options, we will start him back on valsartan 320 mg daily.  He was on this previously, but discontinued at time of 2018 recall and never put back on.  We will look into getting him a new device.  In the meantime he should take valsartan at same time as hctz and first dose of hydralazine every day.  He should continue with second dose of hydralazine at bedtime.  We will see him back in the office in 3 weeks for follow up, at which time we will repeat labs for metabolic panel.

## 2020-05-04 ENCOUNTER — Telehealth: Payer: Self-pay | Admitting: Licensed Clinical Social Worker

## 2020-05-04 NOTE — Telephone Encounter (Signed)
LCSW reached out to pt at request of PharmD Erasmo Downer. Pt attended his last appointment with pharmacy team and brought his cuff which we had sent to him. Apparently it was not functioning properly.   LCSW reached pt at [phone]. Introduced self, role, reason for call. This Probation officer has previously spoken with pt confirmed again home address and emergency contact. Pt receives SSDI, he is able to obtain and currently afford his medications. He lives alone and his brother lives next door. He receives [employer] (food stamps) but goes to his brother's restaurant to get a meal. Pt still driving to appointments. The only concern he shares that he currently utilizes a space heater at home. When asked about whether this was due to cost or if the system is broken pt shares it is related to the system itself. It sounds like that system is not currently working/not connected to power source. Inquired if pt would be willing to speak with a community agency that assists with home repairs if they are willing/able to look at the system. Pt gives permission for me to send referral via [place].   I will also request that a new cuff be sent to pt home and let team lead Kennyth Lose know of challenge with previous cuff. Pt has my number for any additional concerns.   I will try and f/u with him regarding the heating system if any referrals in [place] accepted.   [name], MSW, [place]  [phone]

## 2020-05-04 NOTE — Telephone Encounter (Signed)
CSW referred to assist patient with obtaining a BP cuff. Cuff will be delivered to home via [facility].  CSW available as needed. Raquel Sarna, [place], [place]

## 2020-05-11 ENCOUNTER — Telehealth: Payer: Self-pay | Admitting: Licensed Clinical Social Worker

## 2020-05-11 NOTE — Telephone Encounter (Addendum)
LCSW reached back out to [employer] through [employer]. Left HIPAA compliant message for program at [phone]. Pt having issues w/ his heating and currently using a space heater.   [name], MSW, [place]  [phone]

## 2020-05-12 ENCOUNTER — Telehealth: Payer: Self-pay | Admitting: Licensed Clinical Social Worker

## 2020-05-12 NOTE — Telephone Encounter (Signed)
Pt was sent application by [facility] for weatherization/home repair assistance per referral made via [place]. Will f/u with pt to see if received/completed later this week.    [name], MSW, [place]  [phone]

## 2020-05-18 ENCOUNTER — Telehealth: Payer: Self-pay | Admitting: Licensed Clinical Social Worker

## 2020-05-18 NOTE — Telephone Encounter (Signed)
LATE ENTRY  Spoke with pt via telephone at [phone], pt confirms he spoke with [employer]. He doesn't check the mail often but he will go and check it now that he is aware there may be an application there. Pt confirms he has my number for any additional questions/concerns. Otherwise pt states he is doing fine.   Bradley Torres, MSW, [place]  [phone]

## 2020-05-19 ENCOUNTER — Telehealth: Payer: Medicaid Other | Admitting: Nurse Practitioner

## 2020-05-19 ENCOUNTER — Other Ambulatory Visit: Payer: Self-pay

## 2020-05-19 ENCOUNTER — Encounter: Payer: Self-pay | Admitting: Nurse Practitioner

## 2020-05-19 VITALS — BP 154/83 | HR 72 | Temp 96.9°F | Ht 72.0 in | Wt >= 6400 oz

## 2020-05-19 DIAGNOSIS — Z86010 Personal history of colonic polyps: Secondary | ICD-10-CM

## 2020-05-19 NOTE — Progress Notes (Signed)
Referring Provider: [name], [name] Primary Care Physician:  [name], [name] Primary GI:  Dr.   Chief Complaint  Patient presents with  . Colonoscopy    Due for tcs. Had sleep study but doesn't use CPAP d/t unable to breathe good with it on and causes congestion    HPI:   Bradley Torres is a 60 y.o. male who presents for follow-up to schedule colonoscopy. The patient was last seen in our office 04/19/2019 for history of adenomatous colon polyps. Previous colonoscopy in March 2019 with multiple tubular adenomas, over 12 polyps in total, with an adequate prep. Repeat procedure has been delayed due to several acute medical issues including NSTEMI in October 2019, cardiac cath without significant CAD, newly diagnosed sleep apnea in December 2019, lower extremity DVT, saddle pulmonary embolus. Follow-up echo with stable right-sided heart failure and chronic pulmonary hypertension discharged on Xarelto. Possible CVA at the time of PE as well with MRI and of the brain in March 2020 consistent with recovery from a brain bleed.  He again had to delay his procedure due to sleep apnea and needing a sleep study. It was determined he needed CPAP. Recent chest x-ray unremarkable, recently started on albuterol and Symbicort for pulmonary disease. He was very interested in having a colonoscopy completed.  At his last visit doing fairly well from a GI standpoint with intermittent mild flank pain worse with meals, not overtly painful. Occasional heartburn at nighttime. No other significant GI complaints at that time. Recommended schedule colonoscopy once scheduled for sleep apnea test.  Most recent cardiology office visit 04/29/2020. Noted benign essential hypertension not currently at goal and was restarted on valsartan 320 mg daily. Continue other medications. Follow-up in 3 weeks to repeat labs and metabolic panel.  Today he states he doing okay overall. Today he states he had his sleep study and  was recommended to have CPAP. However, he's not using it currently because it makes it hard to breathe and congested. Denies abdominal pain, N/V, hematochezia, melena, fever, chills, unintentional weight loss. Denies URI or flu-like symptoms. Denies loss of sense of taste or smell. The patient has received COVID-19 vaccination(s). Denies chest pain, dyspnea, dizziness, lightheadedness, syncope, near syncope. Denies any other upper or lower GI symptoms.  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 Severe Obstructive Sleep Apnea (OSA) with a very high AHI of  78.6/h. No REM sleep or supine sleep were recorded. All events  were hypopneas. Hypoxemia was severe, too. Snoring and irregular  heart rate were noted. 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  2. The patient responded well to CPAP at 12 and 123 cm water  pressure, nadir of oxygen improved, sleep efficiency and sleep  architecture normalized. EKG remained abnormal.    Past Medical History:  Diagnosis Date  . CAD (coronary artery disease)    a. 12/2017: cath showing 10% Proximal-LAD stenosis with no significant obstructive disease and  LVEDP mildly elevated at 18 mm Hg.   Marland Kitchen Chronic back pain   . Diabetes mellitus, type 2 ([place])   . DVT (deep venous thrombosis) ([place])   . Essential hypertension   . MI (myocardial infarction) ([place])   . Morbid obesity ([place])   . Panic disorder   . Prediabetes   . Shoulder pain, left    Following fall   . Vitamin D deficiency     Past Surgical History:  Procedure Laterality Date  . Ankle fracture sugery Left   . COLONOSCOPY WITH PROPOFOL N/A 05/29/2017   Dr. [name]: Multiple tubular adenomas removed, prep inadequate.  Short interval surveillance colonoscopy recommended in 6 months  . LEFT HEART CATH AND CORONARY ANGIOGRAPHY N/A 01/23/2018   Procedure: LEFT HEART CATH AND CORONARY ANGIOGRAPHY;  Surgeon: Troy Sine, MD;  Location: [facility];  Service: Cardiovascular;  Laterality: N/A;  . LUMBAR SPINE SURGERY     fusion  . POLYPECTOMY  05/29/2017   Procedure: POLYPECTOMY;  Surgeon: Daneil Dolin, MD;  Location: [facility];  Service: Endoscopy;;  ascending colon polyp x5-cs transverse colon polyp x4- cs descending colon polyp x1- hs sigmoid colon polyp x1 -hs rectal polyp x1- hs  . TONSILLECTOMY      Current Outpatient Medications  Medication Sig Dispense Refill  . blood glucose meter kit and supplies KIT Dispense based on patient and insurance preference. Use up to four times daily as directed. (FOR ICD-9 250.00, 250.01). 1 each 0  . Blood Glucose Monitoring Suppl ([place]) w/Device KIT USE TO TEST BLOOD SUGAR DAILY AS DIRECTED. DX: 11.9 1 kit 0  . dapagliflozin propanediol (FARXIGA) 10 MG TABS tablet Take 1 tablet (10 mg total) by mouth daily before breakfast. 90 tablet 1  . gabapentin (NEURONTIN) 300 MG capsule Take 1 capsule by mouth as needed.    Marland Kitchen glucose blood (ONETOUCH VERIO) test strip USE TO TEST BLOOD SUGAR DAILY AS DIRECTED. DX: 11.9 100 each 12  . hydrALAZINE (APRESOLINE) 50 MG tablet Take 1 tablet (50 mg total) by mouth 3 (three) times daily. (Patient taking differently: Take 50 mg by mouth 2 (two) times daily.) 90 tablet 3  . hydrochlorothiazide (HYDRODIURIL) 25 MG tablet Take 25 mg by mouth daily.    Glory Rosebush Delica Lancets 42V MISC USE TO TEST BLOOD SUGAR DAILY AS DIRECTED. DX: 11.9 100 each 11  . oxyCODONE-acetaminophen (PERCOCET)  10-325 MG tablet Take 1 tablet by mouth 3 (three) times daily as needed.    . rivaroxaban (XARELTO) 20 MG TABS tablet TAKE 1 TABLET BY MOUTH DAILY WITH SUPPER 30 tablet 11  . valsartan (DIOVAN) 320 MG tablet Take 1 tablet (320 mg total) by mouth daily. 30 tablet 6   No current facility-administered medications for this visit.    Allergies as of 05/19/2020 - Review Complete 05/19/2020  Allergen Reaction Noted  . Lisinopril Other (See Comments) 05/13/2019  . Bystolic [nebivolol hcl] Swelling 03/13/2020  . Ibuprofen Other (See Comments) 11/20/2013  . Spironolactone Hives 12/25/2019  . Amlodipine Itching and Other (See Comments) 12/15/2019  . Diflucan [fluconazole] Rash 01/19/2018  . Hctz [hydrochlorothiazide] Other (See Comments) 12/15/2019  . Losartan Rash 03/17/2016    Family History  Problem Relation Age of Onset  . Colon cancer Mother        died at age 31  . Heart attack Father   . Hepatitis Sister 42       Autoimmune  . Heart attack Paternal Grandfather  Social History   Socioeconomic History  . Marital status: Single    Spouse name: Not on file  . Number of children: Not on file  . Years of education: Not on file  . Highest education level: Not on file  Occupational History  . Not on file  Tobacco Use  . Smoking status: Never Smoker  . Smokeless tobacco: Never Used  Vaping Use  . Vaping Use: Never used  Substance and Sexual Activity  . Alcohol use: Yes    Comment: occ  . Drug use: Not Currently    Types: Marijuana    Comment: daily; 05/19/20 no marijuana in past 6 months d/t going to pain management clinic  . Sexual activity: Not Currently    Birth control/protection: None  Other Topics Concern  . Not on file  Social History Narrative  . Not on file   Social Determinants of Health   Financial Resource Strain: Medium Risk  . Difficulty of Paying Living Expenses: Somewhat hard  Food Insecurity: No Food Insecurity  . Worried About Charity fundraiser  in the Last Year: Never true  . Ran Out of Food in the Last Year: Never true  Transportation Needs: No Transportation Needs  . Lack of Transportation (Medical): No  . Lack of Transportation (Non-Medical): No  Physical Activity: Not on file  Stress: Not on file  Social Connections: Unknown  . Frequency of Communication with Friends and Family: More than three times a week  . Frequency of Social Gatherings with Friends and Family: More than three times a week  . Attends Religious Services: Not on file  . Active Member of Clubs or Organizations: Not on file  . Attends Archivist Meetings: Not on file  . Marital Status: Not on file    Subjective: Review of Systems  Constitutional: Negative for chills, fever, malaise/fatigue and weight loss.  HENT: Negative for congestion and sore throat.   Respiratory: Negative for cough and shortness of breath.   Cardiovascular: Negative for chest pain and palpitations.  Gastrointestinal: Negative for abdominal pain, blood in stool, constipation, diarrhea, heartburn, melena, nausea and vomiting.  Musculoskeletal: Negative for joint pain and myalgias.  Skin: Negative for rash.  Neurological: Negative for dizziness and weakness.  Endo/Heme/Allergies: Does not bruise/bleed easily.  Psychiatric/Behavioral: Negative for depression. The patient is not nervous/anxious.   All other systems reviewed and are negative.    Objective: BP (!) 154/83   Pulse 72   Temp (!) 96.9 F (36.1 C) (Temporal)   Ht 6' (1.829 m)   Wt (!) 410 lb 12.8 oz (186.3 kg)   BMI 55.71 kg/m  Physical Exam Vitals and nursing note reviewed.  Constitutional:      General: He is not in acute distress.    Appearance: Normal appearance. He is obese. He is not ill-appearing, toxic-appearing or diaphoretic.  HENT:     Head: Normocephalic and atraumatic.     Nose: No congestion or rhinorrhea.  Eyes:     General: No scleral icterus. Cardiovascular:     Rate and Rhythm:  Normal rate and regular rhythm.     Heart sounds: Normal heart sounds.  Pulmonary:     Effort: Pulmonary effort is normal.     Breath sounds: Normal breath sounds.  Abdominal:     General: Bowel sounds are normal. There is no distension.     Palpations: Abdomen is soft. There is no hepatomegaly, splenomegaly or mass.     Tenderness: There is no  abdominal tenderness. There is no guarding or rebound.     Hernia: No hernia is present.  Musculoskeletal:     Cervical back: Neck supple.  Skin:    General: Skin is warm and dry.     Coloration: Skin is not jaundiced.     Findings: No bruising or rash.  Neurological:     General: No focal deficit present.     Mental Status: He is alert and oriented to person, place, and time. Mental status is at baseline.  Psychiatric:        Mood and Affect: Mood normal.        Behavior: Behavior normal.        Thought Content: Thought content normal.      Assessment:  Pleasant 60 year old male presents for rescheduling of colonoscopy. He had a previous colonoscopy in 2019 which had a poor prep and multiple polyps and recommended repeat exam. However, he had multiple acute health situations including MI, CVA, DVT, pulmonary embolus, etc. as outlined in HPI. This is resulted in a 2 to 3-year delay on his repeat colonoscopy. This point he is ready to proceed with scheduling. He did have a sleep study completed as outlined above. Essentially he does have severe obstructive sleep apnea, not currently using his CPAP because he feels it makes him sleep worse. He is a high-level ASA because of his multiple health issues and super morbid obesity. We will proceed with scheduling at this time. Due to poor prep in his last colonoscopy will plan for extended prep including Linzess for 3 days prior to prep.  Proceed with colonoscopy on propofol/MAC by Dr. [name] in near future: the risks, benefits, and alternatives have been discussed with the patient in detail. The patient  states understanding and desires to proceed.  The patient is currently on Farxiga, Neurontin, oxycodone, Xarelto. The patient is not on any other anticoagulants, anxiolytics, chronic pain medications, antidepressants, antidiabetics, or iron supplements. We will plan for the procedure on propofol/MAC. We will make adjustments to [place] and Xarelto (pending cardiology approval)  ASA 3/4   Plan: 1. Colonoscopy as outlined above 2. Extended prep with clear liquids for 2 days, 1-1/2 prep doses, Linzess 145 mcg daily for 3 days prior to starting colon prep 3. Further recommendations to follow procedure 4. Return for follow-up based on post procedure recommendations.    Thank you for allowing [place] to participate in the care of Chales Abrahams, DNP, AGNP-C Adult & Gerontological Nurse Practitioner [facility]   05/19/2020 4:37 PM   Disclaimer: This note was dictated with voice recognition software. Similar sounding words can inadvertently be transcribed and may not be corrected upon review.

## 2020-05-19 NOTE — Patient Instructions (Signed)
Your health issues we discussed today were:   Need for colonoscopy due to previous colon polyps and previous poor prep: 1. We will schedule your colonoscopy for you 2. We will make adjustments to your Farxiga diabetes medicine 3. We will get the okay from cardiology to hold your Xarelto for 2 days prior and will let you know when we have obtained this 4. You will have a "extended prep". Follow the directions on your prep instructions 5. If you have conflicting instructions or if you have any questions or do not understand instructions, please call our office  Overall I recommend:  1. Continue other current medications 2. Return for follow-up based on recommendations made after your procedure 3. Call us for any questions or concerns   ---------------------------------------------------------------  I am glad you have gotten your COVID-19 vaccination!  Even though you are fully vaccinated you should continue to follow CDC and state/local guidelines.  ---------------------------------------------------------------   At [facility] we value your feedback. You may receive a survey about your visit today. Please share your experience as we strive to create trusting relationships with our patients to provide genuine, compassionate, quality care.  We appreciate your understanding and patience as we review any laboratory studies, imaging, and other diagnostic tests that are ordered as we care for you. Our office policy is 5 business days for review of these results, and any emergent or urgent results are addressed in a timely manner for your best interest. If you do not hear from our office in 1 week, please contact us.   We also encourage the use of MyChart, which contains your medical information for your review as well. If you are not enrolled in this feature, an access code is on this after visit summary for your convenience. Thank you for allowing [place] to be involved in your  care.  It was great to see you today!  I hope you have a great end of winter and early spring!!

## 2020-05-20 ENCOUNTER — Telehealth: Payer: Self-pay

## 2020-05-20 ENCOUNTER — Telehealth: Payer: Medicaid Other

## 2020-05-20 NOTE — Progress Notes (Signed)
CC'ED TO PCP 

## 2020-05-20 NOTE — Telephone Encounter (Signed)
Dr. [name], pt was seen in our office yesterday 05/19/20 by Walden Field, NP. Pt is due for a Colonoscopy with Dr. [name]. Is it ok for pt to hold Xarelto for 48 hours prior to procedure? Please advise.

## 2020-05-21 ENCOUNTER — Ambulatory Visit (INDEPENDENT_AMBULATORY_CARE_PROVIDER_SITE_OTHER): Payer: Medicaid Other | Admitting: Pharmacist Clinician (PhC)/ Clinical Pharmacy Specialist

## 2020-05-21 ENCOUNTER — Other Ambulatory Visit: Payer: Self-pay

## 2020-05-21 DIAGNOSIS — I10 Essential (primary) hypertension: Secondary | ICD-10-CM

## 2020-05-21 MED ORDER — FUROSEMIDE 20 MG PO TABS: ORAL_TABLET | ORAL | 3 refills | Status: DC

## 2020-05-21 MED ORDER — CHLORTHALIDONE 25 MG PO TABS: 25.0000 mg | ORAL_TABLET | Freq: Every day | ORAL | 6 refills | Status: DC

## 2020-05-21 NOTE — Assessment & Plan Note (Signed)
Patient with hypertension, now doing much better on combination of valsartan, hctz and bid hydralazine.  Having some issues with lower extremity edema and SOB.  Will give him furosemide 20 mg x 3 days to see if this helps pull some excess fluid from him, and switch the hctz to chlorthalidone to get better 24 hour fluid control.  Advised that he write down home BP readings and bring to Dr. [name].  Also stressed that he needs to see PCP about the sores on his body.  I suspect that he is getting welts then scratching them until they bleed, and they may be getting infected.  Stressed that it probably has nothing to do with the valsartan and that he needs to continue this, as we finally have some good BP readings.   He has been seen in [facility] three times now, so will have Dr. [name] see him next month.  We can follow with him after that as needed.

## 2020-05-21 NOTE — Patient Instructions (Signed)
Return for a a follow up appointment with Dr. [name] on March 28 at 3:15  Check your blood pressure at home daily and keep record of the readings.  Take your BP meds as follows:  Stop hydrochlorothiazide  Start chlorthalidone 25 mg once daily (to replace hydrochlorothiazide)  Take furosemide 20 mg once daily for 3 days only.  Keep remaining tablets in case we need to use them again.   With questions or concerns call Erasmo Downer at [phone]  Bring all of your meds, your BP cuff and your record of home blood pressures to your next appointment.  Exercise as you're able, try to walk approximately 30 minutes per day.  Keep salt intake to a minimum, especially watch canned and prepared boxed foods.  Eat more fresh fruits and vegetables and fewer canned items.  Avoid eating in fast food restaurants.    HOW TO TAKE YOUR BLOOD PRESSURE: . Rest 5 minutes before taking your blood pressure. .  Don't smoke or drink caffeinated beverages for at least 30 minutes before. . Take your blood pressure before (not after) you eat. . Sit comfortably with your back supported and both feet on the floor (don't cross your legs). . Elevate your arm to heart level on a table or a desk. . Use the proper sized cuff. It should fit smoothly and snugly around your bare upper arm. There should be enough room to slip a fingertip under the cuff. The bottom edge of the cuff should be 1 inch above the crease of the elbow. . Ideally, take 3 measurements at one sitting and record the average.

## 2020-05-21 NOTE — Telephone Encounter (Signed)
Hello.  Yes he can hold Xarelto for 48 hours.  Thanks.

## 2020-05-21 NOTE — Progress Notes (Signed)
05/21/2020 [name] 1960/04/05 [id number]   HPI:  Bradley Torres is a 60 y.o. male patient of Dr [name], with a PMH below who presents today for advanced hypertension clinic follow up.  Patient notes problems with hypertension going back about 10 years, and has struggled to control it.  He has been sensitive to multiple medications and reluctant to use higher doses or multiple doses per day.  At his last visit with Dr. [name] his nebivolol was increased to 40 mg and doxazosin to 4 mg.  About 10 days later he called to report overall edema and back pain.  Stated his weight was up to 448 (25 pounds up from Bradley Torres).  When he called in it was recommended he go to the ED for evaluation, but patient refused.  He stopped both nebivolol and doxazosin.  Dr. [name] then added hydralazine 50 mg bid on December 17.   At his first ADV HTN follow up he was rather angry that he was not seeing Dr. [name] again and I spent much of the visit explaining how our clinic worked.  He was finally (somewhat) appeased.  At that time we increased his hydralazine to 50 mg tid.  He was sent a home BP cuff thru the social work team.  Unfortunately the machine didn't work and at his second visit he was angry about having a "piece of junk".  We arranged for social work to send another cuff (large this time).  I was also quite frank with him about being able to control his blood pressure if he didn't like the way he felt with medications.  Patient still frustrated at lack of BP control.  We had a frank discussion about this.  I explained that he has stopped multiple medications due to sensitivities, which leaves [place] fewer to work with.  I also stressed that it often takes 3 or more medications to get pressure under control, and he has only been taking one, and not on the recommended prescribing schedule.  I also stressed that he has to make lifestyle modifications that will help lower pressure, otherwise he will continue to  need more medication as he becomes more sedentary, doesn't lose weight or adjust caffeine or sodium intake.   Since we saw him last his primary MD added hydrochlorothiazide 25 mg to help with edema.  Patient reports tolerating without concern, he started it just under a week ago, and has yet to see any appreciable drop in pressure.    Today he is in good spirits.  He believes the valsartan is responsible for his recent SOB, usually occurring in the evenings, and he also notes some edema in his legs.  ("my britches get tigher").  It subsides overnight, but is usually back by evening the next day.  He is currently on valsartan 320 mg, which has brought his pressure down to a more respectable 810-175 range systolic, but he is blaming the SOB and some skin sores on the medication.     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, now on  Xarelto  CKD Stage 3 GFR 56  OSA      Blood Pressure Goal:  130/80  Current Medications:  hydralazine 50 mg bid, valsartan 320 mg qd, hctz 25 mg qd  Family Hx: father died from assumed MI/stroke close to 34 - was found deceased in recliner chair; mother died from colon cancer (58); pgf died sudden cardiac death; sister died from autoimmune hepatitis; brother healthy;   Social Hx: no tobacco, no alcohol, Mt Dew - 2-3 cans as well as sweet tea 2-3 glasses per day  Diet: eats one meal per day - Plate lunch - steak , gravy, pot roast; veggies most days; pinto beans; not much breakfast; cookies/crackers/chips for snacks - states he has cut back from what he used to eat  Exercise:  none  Home BP readings: no home readings, although did get new cuff.  States was 130's at GI appointment recently.   Intolerances: lisinopril - cough; nebivolol - edema, spironolactone - hives; losartan - rash; amlodipine - leg pain; hctz - gout flare  Labs: 9/21:  Na 139, K 4.6, Glu 236, BUN 21, SCr 1.3; Vitamin D 4.5  (stated taking supplement to PCP but did not know dose)  Wt Readings from Last 3 Encounters:  05/19/20 (!) 410 lb 12.8 oz (186.3 kg)  03/17/20 (!) 404 lb (183.3 kg)  02/18/20 (!) 423 lb 9.6 oz (192.1 kg)   BP Readings from Last 3 Encounters:  05/21/20 138/82  05/19/20 (!) 154/83  04/29/20 (!) 161/90   Pulse Readings from Last 3 Encounters:  05/21/20 69  05/19/20 72  03/17/20 (!) 107    Current Outpatient Medications  Medication Sig Dispense Refill  . blood glucose meter kit and supplies KIT Dispense based on patient and insurance preference. Use up to four times daily as directed. (FOR ICD-9 250.00, 250.01). 1 each 0  . Blood Glucose Monitoring Suppl ([place]) w/Device KIT USE TO TEST BLOOD SUGAR DAILY AS DIRECTED. DX: 11.9 1 kit 0  . chlorthalidone (HYGROTON) 25 MG tablet Take 1 tablet (25 mg total) by mouth daily. 30 tablet 6  . dapagliflozin propanediol (FARXIGA) 10 MG TABS tablet Take 1 tablet (10 mg total) by mouth daily before breakfast. 90 tablet 1  . furosemide (LASIX) 20 MG tablet Take 1 tablet by mouth daily for 3 days as directed 10 tablet 3  . gabapentin (NEURONTIN) 300 MG capsule Take 1 capsule by mouth as needed.    Marland Kitchen glucose blood (ONETOUCH VERIO) test strip USE TO TEST BLOOD SUGAR DAILY AS DIRECTED. DX: 11.9 100 each 12  . hydrALAZINE (APRESOLINE) 50 MG tablet Take 1 tablet (50 mg total) by mouth 3 (three) times daily. (Patient taking differently: Take 50 mg by mouth 2 (two) times daily.) 90 tablet 3  . OneTouch Delica Lancets 97W MISC USE TO TEST BLOOD SUGAR DAILY AS DIRECTED. DX: 11.9 100 each 11  . oxyCODONE-acetaminophen  (PERCOCET) 10-325 MG tablet Take 1 tablet by mouth 3 (three) times daily as needed.    . rivaroxaban (XARELTO) 20 MG TABS tablet TAKE 1 TABLET BY MOUTH DAILY WITH SUPPER 30 tablet 11  . valsartan (DIOVAN) 320 MG tablet Take 1 tablet (320 mg total) by mouth daily. 30 tablet 6   No current facility-administered medications for this visit.    Allergies  Allergen Reactions  . Lisinopril Other (See Comments)    wheezing  . Bystolic [Nebivolol Hcl] Swelling  . Ibuprofen Other (See Comments)    Rectal bleed  .  Spironolactone Hives  . Amlodipine Itching and Other (See Comments)    Leg pain.  . Diflucan [Fluconazole] Rash  . Hctz [Hydrochlorothiazide] Other (See Comments)    Caused gout flares.  . Losartan Rash    Past Medical History:  Diagnosis Date  . CAD (coronary artery disease)    a. 12/2017: cath showing 10% Proximal-LAD stenosis with no significant obstructive disease and LVEDP mildly elevated at 18 mm Hg.   Marland Kitchen Chronic back pain   . Diabetes mellitus, type 2 ([place])   . DVT (deep venous thrombosis) ([place])   . Essential hypertension   . MI (myocardial infarction) ([place])   . Morbid obesity ([place])   . Panic disorder   . Prediabetes   . Shoulder pain, left    Following fall  . Vitamin D deficiency     Blood pressure 138/82, pulse 69, resp. rate 16, height 6' (1.829 m), SpO2 95 %.  Benign essential HTN Patient with hypertension, now doing much better on combination of valsartan, hctz and bid hydralazine.  Having some issues with lower extremity edema and SOB.  Will give him furosemide 20 mg x 3 days to see if this helps pull some excess fluid from him, and switch the hctz to chlorthalidone to get better 24 hour fluid control.  Advised that he write down home BP readings and bring to Dr. [name].  Also stressed that he needs to see PCP about the sores on his body.  I suspect that he is getting welts then scratching them until they bleed, and they may be getting infected.  Stressed that  it probably has nothing to do with the valsartan and that he needs to continue this, as we finally have some good BP readings.   He has been seen in CV[facility] three times now, so will have Dr. [name] see him next month.  We can follow with him after that as needed.    Tommy Medal PharmD CPP [facility] [address] [place], [place] [address] [phone]

## 2020-05-22 DIAGNOSIS — E559 Vitamin D deficiency, unspecified: Secondary | ICD-10-CM | POA: Diagnosis not present

## 2020-05-22 DIAGNOSIS — F32A Depression, unspecified: Secondary | ICD-10-CM | POA: Diagnosis not present

## 2020-05-22 DIAGNOSIS — E119 Type 2 diabetes mellitus without complications: Secondary | ICD-10-CM | POA: Diagnosis not present

## 2020-05-22 DIAGNOSIS — M539 Dorsopathy, unspecified: Secondary | ICD-10-CM | POA: Diagnosis not present

## 2020-05-22 DIAGNOSIS — Z79899 Other long term (current) drug therapy: Secondary | ICD-10-CM | POA: Diagnosis not present

## 2020-05-22 NOTE — Telephone Encounter (Signed)
Noted. Routing to Walden Field, NP and [facility] Clinical Pool.

## 2020-05-22 NOTE — Telephone Encounter (Signed)
LMOVM for pt to call back 

## 2020-05-25 ENCOUNTER — Encounter: Payer: Self-pay | Admitting: *Deleted

## 2020-05-25 MED ORDER — PEG 3350-KCL-NA BICARB-NACL 420 G PO SOLR: ORAL | 0 refills | Status: DC

## 2020-05-25 NOTE — Telephone Encounter (Signed)
Called pt. He has been scheduled for 4/25 am appt. Aware will mail prep instructions with pre-op/covid test appt. Confirmed mailing address. Rx sent to pharmacy

## 2020-05-25 NOTE — Addendum Note (Signed)
Addended by: Cheron Every on: 05/25/2020 12:01 PM   Modules accepted: Orders

## 2020-05-29 NOTE — Telephone Encounter (Signed)
Noted, thanks!

## 2020-06-03 ENCOUNTER — Telehealth: Payer: Self-pay | Admitting: Licensed Clinical Social Worker

## 2020-06-03 NOTE — Progress Notes (Signed)
Heart and Vascular Care Navigation  06/03/2020  Bradley Torres Oct 14, 1960 [id number]  Reason for Referral:  F/u on weatherization assistance program application                                                                                                    Assessment:                                     LCSW and pt spoke this morning regarding [facility] application for Weatherization/Home Repairs. Pt shares he is still interested in the program and would like to apply. He shares that the home is in his brother Bradley Torres's name. Bradley Torres can read and write at a higher level than pt can and pt gives permission for Bradley Torres to be contacted. LCSW has completed application details that I am able to- I have also completed a detailed list of what pt/pt brother will need to complete and provide to submit the application.   LCSW was able to contact Bradley Torres, pt brother, at [phone]. Introduced self, role, reason for call. Bradley Torres confirms he can assist pt and is aware I will mail the application and instructions to pt home and will f/u.   HRT/VAS Care Coordination    Patients Home Cardiology Office [place] Team Social Worker   Social Worker Name: Bradley Torres [place], [phone]   Living arrangements for the past 2 months Single Family Home   Lives with: Self   Patient Current Insurance Coverage Medicaid   Patient Has Concern With Paying Medical Bills No   Does Patient Have Prescription Coverage? Yes   Home Assistive Devices/Equipment Blood pressure cuff; CBG Meter   Current home services DME      Social History:                                                                             SDOH Screenings   Alcohol Screen: Not on file  Depression (PHQ2-9): Medium Risk  . PHQ-2 Score: 10  Financial Resource Strain: Medium Risk  . Difficulty of Paying Living Expenses: Somewhat hard  Food Insecurity: No Food Insecurity  . Worried  About Charity fundraiser in the Last Year: Never true  . Ran Out of Food in the Last Year: Never true  Housing: Low Risk   . Last Housing Risk Score: 0  Physical Activity: Not on file  Social Connections: Unknown  . Frequency of Communication with Friends and Family: More than three times a week  . Frequency of Social Gatherings with Friends and Family: More than three times a week  . Attends Religious Services: Not on file  . Active Member of Clubs or  Organizations: Not on file  . Attends Archivist Meetings: Not on file  . Marital Status: Not on file  Stress: Not on file  Tobacco Use: Low Risk   . Smoking Tobacco Use: Never Smoker  . Smokeless Tobacco Use: Never Used  Transportation Needs: No Transportation Needs  . Lack of Transportation (Medical): No  . Lack of Transportation (Non-Medical): No    SDOH Interventions: Housing Insecurity:  Housing Interventions: Other (Comment),[id number] Referral (Midwife Program through [employer].)   Follow-up plan:   LCSW has sent patient application and instructions. I have also included my card again for any additional questions/concerns. I also have added a note to pt chart that he may need some additional assistance when completing paperwork or signing consents moving forward.

## 2020-06-04 ENCOUNTER — Telehealth: Payer: Self-pay | Admitting: Licensed Clinical Social Worker

## 2020-06-04 NOTE — Telephone Encounter (Signed)
HIPAA compliant message left for pt at [phone]. I let pt know that I had mailed application along w/ instructions back to him, I have spoken with his brother and if there is any additional questions/concerns that they may have to get back in touch with me. I will f/u in 1 week to check on mail and assess any additional concerns/questions.   [name], MSW, [place]  [phone]

## 2020-06-04 NOTE — Progress Notes (Signed)
Heart and Vascular Care Navigation  06/04/2020  Bradley Torres [birth year] [id number]  Reason for Referral:  F/u on housing resources for weatherization/repairs                                                                                               Assessment:       LCSW received a call back from pt, he is aware he should check his mail for the application w/ instructions on how to complete to be sent back to his brother and him to work on/gather additional documents.                                 HRT/VAS Care Coordination    Patients Home Cardiology Office [place] Team Social Worker   Social Worker Name: Margarito Liner [place], [phone]   Living arrangements for the past 2 months Single Family Home   Lives with: Self   Patient Current Insurance Coverage Medicaid   Patient Has Concern With Paying Medical Bills No   Does Patient Have Prescription Coverage? Yes   Home Assistive Devices/Equipment Blood pressure cuff; CBG Meter   Current home services DME      Social History:                                                                             SDOH Screenings   Alcohol Screen: Not on file  Depression (PHQ2-9): Medium Risk  . PHQ-2 Score: 10  Financial Resource Strain: Medium Risk  . Difficulty of Paying Living Expenses: Somewhat hard  Food Insecurity: No Food Insecurity  . Worried About Charity fundraiser in the Last Year: Never true  . Ran Out of Food in the Last Year: Never true  Housing: Low Risk   . Last Housing Risk Score: 0  Physical Activity: Not on file  Social Connections: Unknown  . Frequency of Communication with Friends and Family: More than three times a week  . Frequency of Social Gatherings with Friends and Family: More than three times a week  . Attends Religious Services: Not on file  . Active Member of Clubs or Organizations: Not on file  . Attends Archivist Meetings: Not on file   . Marital Status: Not on file  Stress: Not on file  Tobacco Use: Low Risk   . Smoking Tobacco Use: Never Smoker  . Smokeless Tobacco Use: Never Used  Transportation Needs: No Transportation Needs  . Lack of Transportation (Medical): No  . Lack of Transportation (Non-Medical): No    SDOH Interventions: Housing Insecurity:  Housing Interventions: Other (Comment) ([place] program)    Follow-up plan:   Pt has my number for any additional needs. I will  f/u next week to check on status of application/answer any questions.

## 2020-06-08 ENCOUNTER — Telehealth: Payer: Self-pay | Admitting: Licensed Clinical Social Worker

## 2020-06-08 NOTE — Telephone Encounter (Signed)
LCSW called and spoke w/ pt, the application mailed back to pt hasn't yet arrived but he will check his mail this afternoon.   Pt w/ additional question regarding a letter he was sent from social security regarding his disability. Due to pt limited ability to read and write he is not completely clear what is is asking him, and due to distance he cannot easily bring to this Probation officer. I encouraged him to show his brother and if they both have questions he can call me, usually there is an assigned case worker that may be able to help. It this is unable to be done then I encouraged him to call his primary care office and ask if they could assist him as they are proximity wise closer to him and may be able to assist w/ completion.   Pt will call if he continues to need assistance/once the application has arrived back to him.   Bradley Torres, MSW, [place]  [phone]

## 2020-06-17 ENCOUNTER — Telehealth: Payer: Self-pay | Admitting: Licensed Clinical Social Worker

## 2020-06-17 NOTE — Telephone Encounter (Signed)
LCSW received a call from pt regarding weatherization/repair application. LCSW walked through pt questions. He is still working on gathering documentation but has filled out the rest of the application as able. Encouraged him once all documentation gathered to call me and we can schedule a time for him to bring it and I can send it through fax/email from our office.   [name], MSW, [place]  [phone]

## 2020-06-19 ENCOUNTER — Telehealth: Payer: Medicaid Other

## 2020-06-19 DIAGNOSIS — Z79899 Other long term (current) drug therapy: Secondary | ICD-10-CM | POA: Diagnosis not present

## 2020-06-19 DIAGNOSIS — E559 Vitamin D deficiency, unspecified: Secondary | ICD-10-CM | POA: Diagnosis not present

## 2020-06-19 DIAGNOSIS — F32A Depression, unspecified: Secondary | ICD-10-CM | POA: Diagnosis not present

## 2020-06-19 DIAGNOSIS — E119 Type 2 diabetes mellitus without complications: Secondary | ICD-10-CM | POA: Diagnosis not present

## 2020-06-19 DIAGNOSIS — M539 Dorsopathy, unspecified: Secondary | ICD-10-CM | POA: Diagnosis not present

## 2020-06-22 ENCOUNTER — Other Ambulatory Visit: Payer: Self-pay | Admitting: Family

## 2020-06-22 ENCOUNTER — Telehealth: Payer: Self-pay | Admitting: Licensed Clinical Social Worker

## 2020-06-22 DIAGNOSIS — Z86711 Personal history of pulmonary embolism: Secondary | ICD-10-CM

## 2020-06-22 NOTE — Telephone Encounter (Signed)
Pt had left application for this Probation officer at [employer] office for [employer] along w/ documents. LCSW mailed application and copies of documents to program, I mailed pt back his documents for his records.   [name], MSW, [place]  [phone]

## 2020-06-24 ENCOUNTER — Telehealth: Payer: Self-pay | Admitting: Licensed Clinical Social Worker

## 2020-06-24 NOTE — Telephone Encounter (Signed)
Called patient after speaking with [name] LCSW, regarding concern for taking medications properly.   Patient stated that he is taking all medications without concern, however he feels he is more short of breath and fatigues easily.   Stated was unable to work on his car yesterday due to this.   Spoke with Coletta Memos NP, who will see him in the office next week.

## 2020-06-24 NOTE — Telephone Encounter (Signed)
Called and updated pt that I had mailed application to [facility] for consideration for their weatherization program. I will put a reminder on my schedule to check if received next week. Pt aware I mailed original supporting documents back to him and he will call if any additional questions/concerns before then. He did have a question about his BP meds and if they were working. Pt shared that he has not been regularly checking his BP using cuff sent to him. He just wants to make sure he is taking his medications correctly and I shared I could pass on his questions to pharmacy team.   Bradley Torres, MSW, [place]  [phone]

## 2020-06-30 NOTE — Progress Notes (Signed)
Cardiology Clinic Note   Patient Name: Bradley Torres Date of Encounter: 07/02/2020  Primary Care Provider:  [name], [name] Primary Cardiologist:  Skeet Latch, MD  Patient Profile    Bradley Torres. [place] 60 year old male presents the clinic today for follow-up evaluation of his hypertension.  Past Medical History    Past Medical History:  Diagnosis Date  . CAD (coronary artery disease)    a. 12/2017: cath showing 10% Proximal-LAD stenosis with no significant obstructive disease and LVEDP mildly elevated at 18 mm Hg.   Marland Kitchen Chronic back pain   . Diabetes mellitus, type 2 ([place])   . DVT (deep venous thrombosis) ([place])   . Essential hypertension   . MI (myocardial infarction) ([place])   . Morbid obesity ([place])   . Panic disorder   . Prediabetes   . Shoulder pain, left    Following fall  . Vitamin D deficiency    Past Surgical History:  Procedure Laterality Date  . Ankle fracture sugery Left   . COLONOSCOPY WITH PROPOFOL N/A 05/29/2017   Dr. [name]: Multiple tubular adenomas removed, prep inadequate.  Short interval surveillance colonoscopy recommended in 6 months  . LEFT HEART CATH AND CORONARY ANGIOGRAPHY N/A 01/23/2018   Procedure: LEFT HEART CATH AND CORONARY ANGIOGRAPHY;  Surgeon: Troy Sine, MD;  Location: [facility];  Service: Cardiovascular;  Laterality: N/A;  . LUMBAR SPINE SURGERY     fusion  . POLYPECTOMY  05/29/2017   Procedure: POLYPECTOMY;  Surgeon: Daneil Dolin, MD;  Location: [facility];  Service: Endoscopy;;  ascending colon polyp x5-cs transverse colon polyp x4- cs descending colon polyp x1- hs sigmoid colon polyp x1 -hs rectal polyp x1- hs  . TONSILLECTOMY      Allergies  Allergies  Allergen Reactions  . Lisinopril Other (See Comments)    wheezing  . Bystolic [Nebivolol Hcl] Swelling  . Ibuprofen Other (See Comments)    Rectal bleed  . Spironolactone Hives  . Amlodipine Itching and Other (See Comments)    Leg pain.  . Diflucan  [Fluconazole] Rash  . Hctz [Hydrochlorothiazide] Other (See Comments)    Caused gout flares.  . Losartan Rash    History of Present Illness    Mr. Meschke is a PMH of atrial fibrillation, HTN, HLD, diabetes, pulmonary hypertension, PE, OSA, minimal coronary calcification, morbid obesity, and anxiety.  He was initially seen in the hypertension clinic 10/21.  He was first diagnosed with hypertension in his late 59s and has continued to struggle with good control.  He was previously taking metoprolol for palpitations.  He was seen by his PCP 9/21 was prescribed hydralazine however he was unwilling to take it more than once per day.  He followed up with Katina Dung, NP and his blood pressure was 164/92.  It was recommended that he increase his metoprolol to 200 mg but he was reluctant.  His heart rate at that time was 95.  He was referred to the hypertension clinic.  He reported fatigue and his metoprolol was switched to nebivolol.  He reported previous heart attacks although, cardiac catheterization 2019 showed 10% LAD disease.  He has been previously diagnosed with OSA but was in the process of obtaining a new mask.  He was unable to cook for himself because he could not stand for extended periods of time.  He reported taking his brothers restaurant food which was high in sodium.  He reported him most of his meals being fried and  he drinks 2000-calorie sugary drinks with caffeine.  His blood pressure remained poorly controlled.  Doxazosin was added to his medication regimen.  He reported that both his blood pressure and his blood sugars were better controlled when he was able to use marijuana.  He reported started smoking at age 3.  He has helped his anxiety and anger issues.  He is no longer able to use this due to his pain management contract.  He was last seen by Dr. [name] on 02/17/2020.  During that time he was feeling okay.  He noticed light headaches off and on.  He was unable to report how long  this was going on.  He struggled with sleep and anxiety.  He reported being anxious in the winter.  He remains sedentary.  He was staying at home and not getting out much.  He did note some pain with his right leg.  He questioned whether he had a blood clot.  He had no edema with his pain and it had improved.  He had no calf pain and his pain was mostly anterior.  His blood pressure at home was 186/103.  He reported checking it regularly.  He will check his blood pressure in his forearm and was not sure if it was accurate.  He struggled with his anxiety.  He denied orthopnea and PND.  He reported increased weight but was not sure why.  He reported his ability to read was limited.  He was contacted by clinical pharmacist 06/24/2020.  She was checking to make sure that he was taking his medications as prescribed and did not have any medication concerns.  He reported that he was feeling more short of breath and noted increased fatigue.  He presents the clinic today for follow-up evaluation states he has been limiting his physical activity due to his chronic back pain and left leg pain.  He reports that he continues to have fatigue and feels it is related to his blood pressure.  We reviewed safe blood pressure and the benefits of having good blood pressure control.  He expressed understanding.  He reports that he likes to be outside and active in the spring/summer time.  I encouraged this.  We discussed low-sodium diet.  He reports that he continues to go to his brother's restaurant and eat meals later.  He reports he adds salt to his meals.  I have encouraged him to review our salty 6 diet sheet and refrain from adding extra salt to his food.  He also reports that he enjoys drinking soda and sweet tea.  I have encouraged him to transition to mainly drinking water due to calories.  His blood pressure monitor was checked next to our clinic cuff today.  His blood pressure cuff reads about 10 mmHg higher systolic.  He  brings in his log from home and his blood pressures are ranging in the 130s over 80s.  I will not add in the other agents today and continue with his current medication regimen.  We will give him salty 6 diet sheet, have him continue to increase his physical activity, and have him follow-up in 3 months.  Today he denies chest pain,  lower extremity edema, increased fatigue, palpitations, melena, hematuria, hemoptysis, diaphoresis, weakness, presyncope, syncope, orthopnea, and PND.  Previous antihypertensives: Thiazide- gout CCB ARB ACE-I- cough, wheezing Spironolactone- rash  Home Medications    Prior to Admission medications   Medication Sig Start Date End Date Taking? Authorizing Provider  blood glucose  meter kit and supplies KIT Dispense based on patient and insurance preference. Use up to four times daily as directed. (FOR ICD-9 250.00, 250.01). 01/07/20   [name], [name]  Blood Glucose Monitoring Suppl ([place]) w/Device KIT USE TO TEST BLOOD SUGAR DAILY AS DIRECTED. DX: 11.9 01/07/20   [name], [name]  chlorthalidone (HYGROTON) 25 MG tablet Take 1 tablet (25 mg total) by mouth daily. 05/21/20 08/19/20  Skeet Latch, MD  dapagliflozin propanediol (FARXIGA) 10 MG TABS tablet Take 1 tablet (10 mg total) by mouth daily before breakfast. 02/18/20   Ivy Lynn, NP  furosemide (LASIX) 20 MG tablet Take 1 tablet by mouth daily for 3 days as directed 05/21/20   Skeet Latch, MD  gabapentin (NEURONTIN) 300 MG capsule Take 1 capsule by mouth as needed. 04/22/20   [provider]  glucose blood (ONETOUCH VERIO) test strip USE TO TEST BLOOD SUGAR DAILY AS DIRECTED. DX: 11.9 01/07/20   Hendricks Limes F, [name]  hydrALAZINE (APRESOLINE) 50 MG tablet Take 1 tablet (50 mg total) by mouth 3 (three) times daily. Patient taking differently: Take 50 mg by mouth 2 (two) times daily. 03/17/20 06/15/20  Skeet Latch, MD  OneTouch Delica Lancets 09F MISC USE  TO TEST BLOOD SUGAR DAILY AS DIRECTED. DX: 11.9 01/07/20   Hendricks Limes F, [name]  oxyCODONE-acetaminophen (PERCOCET) 10-325 MG tablet Take 1 tablet by mouth 3 (three) times daily as needed.    [provider]  polyethylene glycol-electrolytes (NULYTELY) 420 g solution As directed 05/25/20   Rourk, Cristopher Estimable, MD  valsartan (DIOVAN) 320 MG tablet Take 1 tablet (320 mg total) by mouth daily. 04/29/20   Skeet Latch, MD  XARELTO 20 MG TABS tablet TAKE ONE TABLET BY MOUTH DAILY WITH SUPPER 06/22/20   [name], [name]    Family History    Family History  Problem Relation Age of Onset  . Colon cancer Mother        died at age 3  . Heart attack Father   . Hepatitis Sister 42       Autoimmune  . Heart attack Paternal Grandfather    He indicated that his mother is deceased. He indicated that his father is deceased. He indicated that his sister is deceased. He indicated that his brother is alive. He indicated that the status of his paternal grandfather is unknown.  Social History    Social History   Socioeconomic History  . Marital status: Single    Spouse name: Not on file  . Number of children: Not on file  . Years of education: Not on file  . Highest education level: Not on file  Occupational History  . Not on file  Tobacco Use  . Smoking status: Never Smoker  . Smokeless tobacco: Never Used  Vaping Use  . Vaping Use: Never used  Substance and Sexual Activity  . Alcohol use: Yes    Comment: occ  . Drug use: Not Currently    Types: Marijuana    Comment: daily; 05/19/20 no marijuana in past 6 months d/t going to pain management clinic  . Sexual activity: Not Currently    Birth control/protection: None  Other Topics Concern  . Not on file  Social History Narrative  . Not on file   Social Determinants of Health   Financial Resource Strain: Medium Risk  . Difficulty of Paying Living Expenses: Somewhat hard  Food Insecurity: No Food Insecurity  . Worried About  Running  Out of Food in the Last Year: Never true  . Ran Out of Food in the Last Year: Never true  Transportation Needs: No Transportation Needs  . Lack of Transportation (Medical): No  . Lack of Transportation (Non-Medical): No  Physical Activity: Not on file  Stress: Not on file  Social Connections: Unknown  . Frequency of Communication with Friends and Family: More than three times a week  . Frequency of Social Gatherings with Friends and Family: More than three times a week  . Attends Religious Services: Not on file  . Active Member of Clubs or Organizations: Not on file  . Attends Archivist Meetings: Not on file  . Marital Status: Not on file  Intimate Partner Violence: Not on file     Review of Systems    General:  No chills, fever, night sweats or weight changes.  Cardiovascular:  No chest pain, dyspnea on exertion, edema, orthopnea, palpitations, paroxysmal nocturnal dyspnea. Dermatological: No rash, lesions/masses Respiratory: No cough, dyspnea Urologic: No hematuria, dysuria Abdominal:   No nausea, vomiting, diarrhea, bright red blood per rectum, melena, or hematemesis Neurologic:  No visual changes, wkns, changes in mental status. All other systems reviewed and are otherwise negative except as noted above.  Physical Exam    VS:  BP 124/76   Pulse (!) 105   Ht 6' (1.829 m)   Wt (!) 411 lb 6.4 oz (186.6 kg)   SpO2 95%   BMI 55.80 kg/m  , BMI Body mass index is 55.8 kg/m. GEN: Well nourished, well developed, in no acute distress. HEENT: normal. Neck: Supple, no JVD, carotid bruits, or masses. Cardiac: RRR, no murmurs, rubs, or gallops. No clubbing, cyanosis, edema.  Radials/DP/PT 2+ and equal bilaterally.  Respiratory:  Respirations regular and unlabored, clear to auscultation bilaterally. GI: Soft, nontender, nondistended, BS + x 4. MS: no deformity or atrophy. Skin: warm and dry, no rash. Neuro:  Strength and sensation are intact. Psych: Normal  affect.  Accessory Clinical Findings    Recent Labs: 12/16/2019: ALT 19; BUN 21; Creatinine 1.3; Hemoglobin 13.2; Platelets 272; Potassium 4.6; Sodium 139   Recent Lipid Panel    Component Value Date/Time   CHOL 183 01/07/2019 1134   TRIG 114 01/07/2019 1134   HDL 39 (L) 01/07/2019 1134   CHOLHDL 4.7 01/07/2019 1134   CHOLHDL 5.1 01/20/2018 0209   VLDL 26 01/20/2018 0209   LDLCALC 123 (H) 01/07/2019 1134    ECG personally reviewed by me today-none today.  Echocardiogram 05/21/2019 IMPRESSIONS    1. Left ventricular ejection fraction, by estimation, is 60 to 65%. The  left ventricle has normal function. The left ventricle has no regional  wall motion abnormalities. There is moderate concentric left ventricular  hypertrophy. Left ventricular  diastolic parameters were normal.  2. Right ventricular systolic function is normal. The right ventricular  size is normal.  3. The mitral valve is grossly normal. Trivial mitral valve  regurgitation.  4. The aortic valve is tricuspid. Aortic valve regurgitation is not  visualized. No aortic stenosis is present.  5. The inferior vena cava is normal in size with greater than 50%  respiratory variability, suggesting right atrial pressure of 3 mmHg.  Assessment & Plan   1.  Essential hypertension-BP today 124/76.  Intolerant to multiple medications.  Fatigue improved with changing from metoprolol to nebivolol.  Reports compliance with low-sodium diet.  He was previously not a good candidate for [employer] exercise program due to inability to get transportation  to [place].  Continues to work on getting CPAP operational.  Reviewed avoiding salt and salty foods.   Continue nebivolol 40 mg, doxazosin 4 mg, valsartan 160 Continue stress reduction/anxiety reduction  Fatigue -denies recent episodes of dizziness presyncope or syncope.  Feels it was related to medication change and poor hydration.  Appears to be related to sedentary lifestyle.  He  reports he has been inactive for quite some time.  Weight today 411.4 pounds Increase p.o. hydration Increase physical activity as tolerated Weight loss recommended  Chronic diastolic CHF/shortness of breath -increased dyspnea with increased physical activity.  No increased activity intolerance.  Echocardiogram 01/23/2018 showed EF 60-65%, details above. Heart healthy low-sodium diet Increase physical activity as tolerated Repeat echocardiogram  Follow-up with Dr. [name] or me in 3 month.  Jossie Ng. Pape Parson NP-C    07/02/2020, 3:43 PM [place] [address] Office [phone] Fax [phone]  Notice: This dictation was prepared with Dragon dictation along with smaller phrase technology. Any transcriptional errors that result from this process are unintentional and may not be corrected upon review.  I spent 15 minutes examining this patient, reviewing medications, and using patient centered shared decision making involving her cardiac care.  Prior to her visit I spent greater than 20 minutes reviewing her past medical history,  medications, and prior cardiac tests.

## 2020-07-02 ENCOUNTER — Ambulatory Visit (INDEPENDENT_AMBULATORY_CARE_PROVIDER_SITE_OTHER): Payer: Medicaid Other | Admitting: General Practice

## 2020-07-02 ENCOUNTER — Encounter: Payer: Self-pay | Admitting: General Practice

## 2020-07-02 ENCOUNTER — Other Ambulatory Visit: Payer: Self-pay

## 2020-07-02 VITALS — BP 124/76 | HR 105 | Ht 72.0 in | Wt >= 6400 oz

## 2020-07-02 DIAGNOSIS — R5383 Other fatigue: Secondary | ICD-10-CM

## 2020-07-02 DIAGNOSIS — R0602 Shortness of breath: Secondary | ICD-10-CM

## 2020-07-02 DIAGNOSIS — I5032 Chronic diastolic (congestive) heart failure: Secondary | ICD-10-CM | POA: Diagnosis not present

## 2020-07-02 DIAGNOSIS — I10 Essential (primary) hypertension: Secondary | ICD-10-CM | POA: Diagnosis not present

## 2020-07-02 NOTE — Patient Instructions (Signed)
Medication Instructions:  The current medical regimen is effective;  continue present plan and medications as directed. Please refer to the Current Medication list given to you today.  *If you need a refill on your cardiac medications before your next appointment, please call your pharmacy*  Special Instructions TAKE AND LOG YOUR BLOOD PRESSURE, BRING WITH YOUR TO FOLLOW UP APPOINTMENT  PLEASE READ AND FOLLOW SALTY 6-ATTACHED-1,800mg  daily  PLEASE INCREASE PHYSICAL ACTIVITY AS TOLERATED  AVOID SIMPLE CARBS:  BREAD, RICE, PASTA AND SWEETS  STOP DRINKING SWEET TEA AND SODA DRINK WATER WHEN THIRSTY  PLEASE READ AND FOLLOW STRESS REDUCTION TIPS-ATTACHED  Follow-Up: Your next appointment:  3 month(s) In Person with Skeet Latch, MD OR IF UNAVAILABLE [place], [name]-C   Please call our office 2 months in advance to schedule this appointment   At [facility], you and your health needs are our priority.  As part of our continuing mission to provide you with exceptional heart care, we have created designated Provider Care Teams.  These Care Teams include your primary Cardiologist (physician) and Advanced Practice Providers (APPs -  Physician Assistants and Nurse Practitioners) who all work together to provide you with the care you need, when you need it.            6 SALTY THINGS TO AVOID     1,800MG  DAILY     Mindfulness-Based Stress Reduction Mindfulness-based stress reduction (MBSR) is a program that helps people learn to practice mindfulness. Mindfulness is the practice of intentionally paying attention to the present moment. MBSR focuses on developing self-awareness, which allows you to respond to life stress without judgment or negative emotions. It can be learned and practiced through techniques such as education, breathing exercises, meditation, and yoga. MBSR includes several mindfulness techniques in one program. MBSR works best when you understand the treatment, are  willing to try new things, and can commit to spending time practicing what you learn. MBSR training may include learning about:  How your emotions, thoughts, and reactions affect your body.  New ways to respond to things that cause negative thoughts to start (triggers).  How to notice your thoughts and let go of them.  Practicing awareness of everyday things that you normally do without thinking.  The techniques and goals of different types of meditation. What are the benefits of MBSR? MBSR can have many benefits, which include helping you to:  Develop self-awareness. This refers to knowing and understanding yourself.  Learn skills and attitudes that help you to participate in your own health care.  Learn new ways to care for yourself.  Be more accepting about how things are, and let things go.  Be less judgmental and approach things with an open mind.  Be patient with yourself and trust yourself more. MBSR has also been shown to:  Reduce negative emotions, such as depression and anxiety.  Improve memory and focus.  Change how you sense and approach pain.  Boost your body's ability to fight infections.  Help you connect better with other people.  Improve your sense of well-being. Follow these instructions at home:  Find a local in-person or online MBSR program.  Set aside some time regularly for mindfulness practice.  Find a mindfulness practice that works best for you. This may include one or more of the following: ? Meditation. Meditation involves focusing your mind on a certain thought or activity. ? Breathing awareness exercises. These help you to stay present by focusing on your breath. ? Body scan.  For this practice, you lie down and pay attention to each part of your body from head to toe. You can identify tension and soreness and intentionally relax parts of your body. ? Yoga. Yoga involves stretching and breathing, and it can improve your ability to move and  be flexible. It can also provide an experience of testing your body's limits, which can help you release stress. ? Mindful eating. This way of eating involves focusing on the taste, texture, color, and smell of each bite of food. Because this slows down eating and helps you feel full sooner, it can be an important part of a weight-loss plan.  Find a podcast or recording that provides guidance for breathing awareness, body scan, or meditation exercises. You can listen to these any time when you have a free moment to rest without distractions.  Follow your treatment plan as told by your health care provider. This may include taking regular medicines and making changes to your diet or lifestyle as recommended.   How to practice mindfulness To do a basic awareness exercise:  Find a comfortable place to sit.  Pay attention to the present moment. Observe your thoughts, feelings, and surroundings just as they are.  Avoid placing judgment on yourself, your feelings, or your surroundings. Make note of any judgment that comes up, and let it go.  Your mind may wander, and that is okay. Make note of when your thoughts drift, and return your attention to the present moment. To do basic mindfulness meditation:  Find a comfortable place to sit. This may include a stable chair or a firm floor cushion. ? Sit upright with your back straight. Let your arms fall next to your side with your hands resting on your legs. ? If sitting in a chair, rest your feet flat on the floor. ? If sitting on a cushion, cross your legs in front of you.  Keep your head in a neutral position with your chin dropped slightly. Relax your jaw and rest the tip of your tongue on the roof of your mouth. Drop your gaze to the floor. You can close your eyes if you like.  Breathe normally and pay attention to your breath. Feel the air moving in and out of your nose. Feel your belly expanding and relaxing with each breath.  Your mind may  wander, and that is okay. Make note of when your thoughts drift, and return your attention to your breath.  Avoid placing judgment on yourself, your feelings, or your surroundings. Make note of any judgment or feelings that come up, let them go, and bring your attention back to your breath.  When you are ready, lift your gaze or open your eyes. Pay attention to how your body feels after the meditation. Where to find more information You can find more information about MBSR from:  Your health care provider.  Community-based meditation centers or programs.  Programs offered near you. Summary  Mindfulness-based stress reduction (MBSR) is a program that teaches you how to intentionally pay attention to the present moment. It is used with other treatments to help you cope better with daily stress, emotions, and pain.  MBSR focuses on developing self-awareness, which allows you to respond to life stress without judgment or negative emotions.  MBSR programs may involve learning different mindfulness practices, such as breathing exercises, meditation, yoga, body scan, or mindful eating. Find a mindfulness practice that works best for you, and set aside time for it on a regular basis.  This information is not intended to replace advice given to you by your health care provider. Make sure you discuss any questions you have with your health care provider. Document Revised: 11/29/2019 Document Reviewed: 11/29/2019 Elsevier Patient Education  2021 [employer].

## 2020-07-03 ENCOUNTER — Telehealth: Payer: Self-pay | Admitting: Licensed Clinical Social Worker

## 2020-07-03 NOTE — Telephone Encounter (Signed)
Email sent to [employer] to ensure pt application for weatherization had been received. Will f/u next week if no response.  [name], MSW, [place]  [phone]

## 2020-07-07 ENCOUNTER — Telehealth: Payer: Self-pay | Admitting: Licensed Clinical Social Worker

## 2020-07-07 NOTE — Telephone Encounter (Signed)
Pt called this Probation officer, inquiring about update for [employer] application. LCSW shared number for program, let him know I had reached out last week but not yet heard anything. Per website they will return calls within a day, requested pt let me know by Friday if they had not called him back yet.   [name], MSW, [place]  [phone]

## 2020-07-13 NOTE — Patient Instructions (Signed)
Bradley Torres  07/13/2020     @PREFPERIOPPHARMACY @   Your procedure is scheduled on  07/20/2020.   Report to [facility] at  [phone] A.M.   Call this number if you have problems the morning of surgery:  [phone]   Remember:  Follow the diet and pre instructions given to you by the office.                      Take these medicines the morning of surgery with A SIP OF WATER  Gabapentin, oxycodone (if needed).  Your last dose of Xarelto should be on 07/17/2020.  DO NOT take any medications for diabetes the morning of your procedure.  If your glucose is 70 or below the morning of your procedure, drink 1/2 cup of clear juice and recheck your glucose in 15 minutes. If your glucose is still 70 or below, call [phone] for instructions.  If your glucose is 300 or above the morning of your procedure, call [phone] for instructions.                 Please brush your teeth,  Do not wear jewelry, make-up or nail polish.  Do not wear lotions, powders, or perfumes, or deodorant.  Do not shave 48 hours prior to surgery.  Men may shave face and neck.  Do not bring valuables to the hospital.  [facility] is not responsible for any belongings or valuables.  Contacts, dentures or bridgework may not be worn into surgery.  Leave your suitcase in the car.  After surgery it may be brought to your room.  For patients admitted to the hospital, discharge time will be determined by your treatment team.  Patients discharged the day of surgery will not be allowed to drive home and must have someone with them for 24 hours.    Special instructions:   DO NOT smoke tobacco or vape for 24 hours before your procedure.    Please read over the following fact sheets that you were given. Surgical Site Infection Prevention and Anesthesia Post-op Instructions       Colonoscopy, Adult, Care After This sheet gives you information about how to care for yourself after your procedure. Your  health care provider may also give you more specific instructions. If you have problems or questions, contact your health care provider. What can I expect after the procedure? After the procedure, it is common to have:  A small amount of blood in your stool for 24 hours after the procedure.  Some gas.  Mild cramping or bloating of your abdomen. Follow these instructions at home: Eating and drinking  Drink enough fluid to keep your urine pale yellow.  Follow instructions from your health care provider about eating or drinking restrictions.  Resume your normal diet as instructed by your health care provider. Avoid heavy or fried foods that are hard to digest.   Activity  Rest as told by your health care provider.  Avoid sitting for a long time without moving. Get up to take short walks every 1-2 hours. This is important to improve blood flow and breathing. Ask for help if you feel weak or unsteady.  Return to your normal activities as told by your health care provider. Ask your health care provider what activities are safe for you. Managing cramping and bloating  Try walking around when you have cramps or feel bloated.  Apply heat to your abdomen as told  by your health care provider. Use the heat source that your health care provider recommends, such as a moist heat pack or a heating pad. ? Place a towel between your skin and the heat source. ? Leave the heat on for 20-30 minutes. ? Remove the heat if your skin turns bright red. This is especially important if you are unable to feel pain, heat, or cold. You may have a greater risk of getting burned.   General instructions  If you were given a sedative during the procedure, it can affect you for several hours. Do not drive or operate machinery until your health care provider says that it is safe.  For the first 24 hours after the procedure: ? Do not sign important documents. ? Do not drink alcohol. ? Do your regular daily  activities at a slower pace than normal. ? Eat soft foods that are easy to digest.  Take over-the-counter and prescription medicines only as told by your health care provider.  Keep all follow-up visits as told by your health care provider. This is important. Contact a health care provider if:  You have blood in your stool 2-3 days after the procedure. Get help right away if you have:  More than a small spotting of blood in your stool.  Large blood clots in your stool.  Swelling of your abdomen.  Nausea or vomiting.  A fever.  Increasing pain in your abdomen that is not relieved with medicine. Summary  After the procedure, it is common to have a small amount of blood in your stool. You may also have mild cramping and bloating of your abdomen.  If you were given a sedative during the procedure, it can affect you for several hours. Do not drive or operate machinery until your health care provider says that it is safe.  Get help right away if you have a lot of blood in your stool, nausea or vomiting, a fever, or increased pain in your abdomen. This information is not intended to replace advice given to you by your health care provider. Make sure you discuss any questions you have with your health care provider. Document Revised: 03/08/2019 Document Reviewed: 10/08/2018 Elsevier Patient Education  2021 [place] After This sheet gives you information about how to care for yourself after your procedure. Your health care provider may also give you more specific instructions. If you have problems or questions, contact your health care provider. What can I expect after the procedure? After the procedure, it is common to have:  Tiredness.  Forgetfulness about what happened after the procedure.  Impaired judgment for important decisions.  Nausea or vomiting.  Some difficulty with balance. Follow these instructions at home: For the time period  you were told by your health care provider:  Rest as needed.  Do not participate in activities where you could fall or become injured.  Do not drive or use machinery.  Do not drink alcohol.  Do not take sleeping pills or medicines that cause drowsiness.  Do not make important decisions or sign legal documents.  Do not take care of children on your own.      Eating and drinking  Follow the diet that is recommended by your health care provider.  Drink enough fluid to keep your urine pale yellow.  If you vomit: ? Drink water, juice, or soup when you can drink without vomiting. ? Make sure you have little or no nausea before eating solid foods.  General instructions  Have a responsible adult stay with you for the time you are told. It is important to have someone help care for you until you are awake and alert.  Take over-the-counter and prescription medicines only as told by your health care provider.  If you have sleep apnea, surgery and certain medicines can increase your risk for breathing problems. Follow instructions from your health care provider about wearing your sleep device: ? Anytime you are sleeping, including during daytime naps. ? While taking prescription pain medicines, sleeping medicines, or medicines that make you drowsy.  Avoid smoking.  Keep all follow-up visits as told by your health care provider. This is important. Contact a health care provider if:  You keep feeling nauseous or you keep vomiting.  You feel light-headed.  You are still sleepy or having trouble with balance after 24 hours.  You develop a rash.  You have a fever.  You have redness or swelling around the IV site. Get help right away if:  You have trouble breathing.  You have new-onset confusion at home. Summary  For several hours after your procedure, you may feel tired. You may also be forgetful and have poor judgment.  Have a responsible adult stay with you for the time  you are told. It is important to have someone help care for you until you are awake and alert.  Rest as told. Do not drive or operate machinery. Do not drink alcohol or take sleeping pills.  Get help right away if you have trouble breathing, or if you suddenly become confused. This information is not intended to replace advice given to you by your health care provider. Make sure you discuss any questions you have with your health care provider. Document Revised: 11/28/2019 Document Reviewed: 02/14/2019 Elsevier Patient Education  2021 [employer].

## 2020-07-15 ENCOUNTER — Ambulatory Visit (INDEPENDENT_AMBULATORY_CARE_PROVIDER_SITE_OTHER): Payer: Medicaid Other | Admitting: Family Medicine

## 2020-07-15 ENCOUNTER — Encounter: Payer: Self-pay | Admitting: Family Medicine

## 2020-07-15 ENCOUNTER — Other Ambulatory Visit: Payer: Self-pay

## 2020-07-15 ENCOUNTER — Ambulatory Visit: Payer: Medicaid Other | Admitting: Family Medicine

## 2020-07-15 VITALS — BP 110/66 | HR 76 | Temp 96.8°F | Ht 73.0 in | Wt >= 6400 oz

## 2020-07-15 DIAGNOSIS — I5032 Chronic diastolic (congestive) heart failure: Secondary | ICD-10-CM | POA: Diagnosis not present

## 2020-07-15 DIAGNOSIS — G4733 Obstructive sleep apnea (adult) (pediatric): Secondary | ICD-10-CM | POA: Diagnosis not present

## 2020-07-15 DIAGNOSIS — E1165 Type 2 diabetes mellitus with hyperglycemia: Secondary | ICD-10-CM | POA: Diagnosis not present

## 2020-07-15 DIAGNOSIS — I10 Essential (primary) hypertension: Secondary | ICD-10-CM | POA: Diagnosis not present

## 2020-07-15 DIAGNOSIS — N1831 Chronic kidney disease, stage 3a: Secondary | ICD-10-CM | POA: Diagnosis not present

## 2020-07-15 DIAGNOSIS — Z6841 Body Mass Index (BMI) 40.0 and over, adult: Secondary | ICD-10-CM | POA: Diagnosis not present

## 2020-07-15 DIAGNOSIS — I252 Old myocardial infarction: Secondary | ICD-10-CM | POA: Diagnosis not present

## 2020-07-15 DIAGNOSIS — G4730 Sleep apnea, unspecified: Secondary | ICD-10-CM | POA: Insufficient documentation

## 2020-07-15 LAB — BAYER DCA HB A1C WAIVED: HB A1C (BAYER DCA - WAIVED): 8 % — ABNORMAL HIGH (ref <7.0)

## 2020-07-15 MED ORDER — RYBELSUS 3 MG PO TABS: 3.0000 mg | ORAL_TABLET | Freq: Every day | ORAL | 0 refills | Status: DC

## 2020-07-15 MED ORDER — ATORVASTATIN CALCIUM 10 MG PO TABS: 10.0000 mg | ORAL_TABLET | Freq: Every day | ORAL | 2 refills | Status: DC

## 2020-07-15 MED ORDER — DAPAGLIFLOZIN PROPANEDIOL 10 MG PO TABS: 10.0000 mg | ORAL_TABLET | Freq: Every day | ORAL | 1 refills | Status: DC

## 2020-07-15 MED ORDER — RYBELSUS 7 MG PO TABS: 7.0000 mg | ORAL_TABLET | Freq: Every day | ORAL | 2 refills | Status: DC

## 2020-07-15 NOTE — Patient Instructions (Signed)
Please schedule a diabetic eye exam.

## 2020-07-15 NOTE — Progress Notes (Signed)
Assessment & Plan:  1. Type 2 diabetes mellitus with hyperglycemia, without long-term current use of insulin (HCC) Lab Results  Component Value Date   HGBA1C 8.0 (H) 07/15/2020   HGBA1C 7.7 (H) 12/25/2019   HGBA1C 6.2 (H) 01/20/2018    - Diabetes is not at goal of A1c < 7. - Medications: continue Farxiga 10 mg once daily.  Rx'd Rybelsus for added cardiovascular, kidney function, and weight loss benefits. He was not agreeable to an injectable.  - Home glucose monitoring: not checking at home - Patient is currently taking a statin. Patient is taking an ACE-inhibitor/ARB.  - Instruction/counseling given: reminded to get eye exam, discussed foot care and discussed the need for weight loss  Diabetes Health Maintenance Due  Topic Date Due  . OPHTHALMOLOGY EXAM  Never done  . HEMOGLOBIN A1C  01/14/2021  . FOOT EXAM  07/15/2021    - Lipid panel - CBC with Differential/Platelet - CMP14+EGFR - Bayer DCA Hb A1c Waived - dapagliflozin propanediol (FARXIGA) 10 MG TABS tablet; Take 1 tablet (10 mg total) by mouth daily before breakfast.  Dispense: 90 tablet; Refill: 1 - Semaglutide (RYBELSUS) 3 MG TABS; Take 3 mg by mouth daily.  Dispense: 30 tablet; Refill: 0 - Semaglutide (RYBELSUS) 7 MG TABS; Take 7 mg by mouth daily.  Dispense: 30 tablet; Refill: 2 - atorvastatin (LIPITOR) 10 MG tablet; Take 1 tablet (10 mg total) by mouth daily.  Dispense: 30 tablet; Refill: 2  2. Chronic diastolic CHF (congestive heart failure) (HCC) Rybelsus chosen for treatment of diabetes due to added cardiovascular benefits.  - Semaglutide (RYBELSUS) 3 MG TABS; Take 3 mg by mouth daily.  Dispense: 30 tablet; Refill: 0 - Semaglutide (RYBELSUS) 7 MG TABS; Take 7 mg by mouth daily.  Dispense: 30 tablet; Refill: 2  3. Hx of non-ST elevation myocardial infarction (NSTEMI) Rybelsus chosen for treatment of diabetes due to added cardiovascular benefits.  - Semaglutide (RYBELSUS) 3 MG TABS; Take 3 mg by mouth daily.   Dispense: 30 tablet; Refill: 0 - Semaglutide (RYBELSUS) 7 MG TABS; Take 7 mg by mouth daily.  Dispense: 30 tablet; Refill: 2 - atorvastatin (LIPITOR) 10 MG tablet; Take 1 tablet (10 mg total) by mouth daily.  Dispense: 30 tablet; Refill: 2  4. Stage 3a chronic kidney disease ([place]) Rybelsus chosen for treatment of diabetes due to added improvement in kidney function benefits. Continue Farxiga.  - CMP14+EGFR - dapagliflozin propanediol (FARXIGA) 10 MG TABS tablet; Take 1 tablet (10 mg total) by mouth daily before breakfast.  Dispense: 90 tablet; Refill: 1 - Semaglutide (RYBELSUS) 3 MG TABS; Take 3 mg by mouth daily.  Dispense: 30 tablet; Refill: 0 - Semaglutide (RYBELSUS) 7 MG TABS; Take 7 mg by mouth daily.  Dispense: 30 tablet; Refill: 2  5. Morbid obesity with BMI of 50.0-59.9, adult ([place]) Rybelsus chosen for treatment of diabetes due to added weight loss benefits. Encouraged diet and exercise.  - Lipid panel - CBC with Differential/Platelet - CMP14+EGFR - Semaglutide (RYBELSUS) 3 MG TABS; Take 3 mg by mouth daily.  Dispense: 30 tablet; Refill: 0 - Semaglutide (RYBELSUS) 7 MG TABS; Take 7 mg by mouth daily.  Dispense: 30 tablet; Refill: 2  6. Benign essential HTN Well controlled on current regimen. Managed by cardiology. - Lipid panel - CBC with Differential/Platelet - CMP14+EGFR  7. Severe obstructive sleep apnea-hypopnea syndrome Non-compliant with CPAP.    Return in about 14 weeks (around 10/21/2020) for DM.  Hendricks Limes, MSN, APRN, [name]-C [facility]  Subjective:    Patient ID: Bradley Torres, male    DOB: 1960/06/05, 60 y.o.   MRN: [id number]  Patient Care Team: [name], [name] as PCP - General (Family Medicine) Skeet Latch, MD as PCP - Cardiology (Cardiology) Shea Evans [name], LCSW as Social Worker (Licensed Clinical Social Worker)   Chief Complaint:  Chief Complaint  Patient presents with  . Hypertension    Check up of chronic  medical conditions     HPI: Bradley Torres is a 60 y.o. male presenting on 07/15/2020 for Hypertension (Check up of chronic medical conditions )  Hypertension: managed by the hypertension clinic.  Diabetes: Patient presents for follow up of diabetes. Current symptoms include: hyperglycemia. Known diabetic complications: none. Medication compliance: has been taking Farxiga 10 mg once daily. Current diet: in general, a "healthy" diet  . Current exercise: none. Home blood sugar records: patient does not check sugars. Is he  on ACE inhibitor or angiotensin II receptor blocker? Yes. Is he on a statin? No.   Sleep apnea: patient reports he was diagnosed with sleep apnea but that they recently came and got his CPAP machine as he was not wearing it. States it made his sleep worse.   Depression screen [facility] 2/9 07/15/2020 02/18/2020 06/21/2019  Decreased Interest 0 2 0  Down, Depressed, Hopeless 0 2 0  PHQ - 2 Score 0 4 0  Altered sleeping 3 3 -  Tired, decreased energy 3 2 -  Change in appetite 0 0 -  Feeling bad or failure about yourself  0 0 -  Trouble concentrating 1 1 -  Moving slowly or fidgety/restless 0 0 -  Suicidal thoughts 0 0 -  PHQ-9 Score 7 10 -  Difficult doing work/chores Not difficult at all - -  Some recent data might be hidden   GAD 7 : Generalized Anxiety Score 07/15/2020 05/15/2019 05/13/2019  Nervous, Anxious, on Edge 0 1 3  Control/stop worrying 0 1 3  Worry too much - different things 0 1 3  Trouble relaxing 1 1 3   Restless 0 0 2  Easily annoyed or irritable 0 0 3  Afraid - awful might happen 0 1 3  Total GAD 7 Score 1 5 20   Anxiety Difficulty Not difficult at all Somewhat difficult Somewhat difficult    New complaints: None  Social history:  Relevant past medical, surgical, family and social history reviewed and updated as indicated. Interim medical history since our last visit reviewed.  Allergies and medications reviewed and updated.  DATA REVIEWED: CHART IN  EPIC  ROS: Negative unless specifically indicated above in HPI.    Current Outpatient Medications:  .  chlorthalidone (HYGROTON) 25 MG tablet, Take 1 tablet (25 mg total) by mouth daily., Disp: 30 tablet, Rfl: 6 .  dapagliflozin propanediol (FARXIGA) 10 MG TABS tablet, Take 1 tablet (10 mg total) by mouth daily before breakfast., Disp: 90 tablet, Rfl: 1 .  gabapentin (NEURONTIN) 300 MG capsule, Take 1 capsule by mouth daily as needed (pain)., Disp: , Rfl:  .  glucose blood (ONETOUCH VERIO) test strip, USE TO TEST BLOOD SUGAR DAILY AS DIRECTED. DX: 11.9, Disp: 100 each, Rfl: 12 .  hydrALAZINE (APRESOLINE) 50 MG tablet, Take 50 mg by mouth daily at 2 PM., Disp: , Rfl:  .  OneTouch Delica Lancets 11Z MISC, USE TO TEST BLOOD SUGAR DAILY AS DIRECTED. DX: 11.9, Disp: 100 each, Rfl: 11 .  oxyCODONE-acetaminophen (PERCOCET) 10-325 MG tablet, Take 1 tablet  by mouth 3 (three) times daily as needed for pain., Disp: , Rfl:  .  valsartan (DIOVAN) 320 MG tablet, Take 1 tablet (320 mg total) by mouth daily. (Patient taking differently: Take 160 mg by mouth daily.), Disp: 30 tablet, Rfl: 6 .  XARELTO 20 MG TABS tablet, TAKE ONE TABLET BY MOUTH DAILY WITH SUPPER (Patient taking differently: Take 20 mg by mouth daily with supper.), Disp: 30 tablet, Rfl: 6   Allergies  Allergen Reactions  . Lisinopril Other (See Comments)    wheezing  . Bystolic [Nebivolol Hcl] Swelling  . Ibuprofen Other (See Comments)    Rectal bleed  . Spironolactone Hives  . Amlodipine Itching and Other (See Comments)    Leg pain.  . Diflucan [Fluconazole] Rash  . Hctz [Hydrochlorothiazide] Other (See Comments)    Caused gout flares.  . Losartan Rash   Past Medical History:  Diagnosis Date  . CAD (coronary artery disease)    a. 12/2017: cath showing 10% Proximal-LAD stenosis with no significant obstructive disease and LVEDP mildly elevated at 18 mm Hg.   Marland Kitchen Chronic back pain   . Diabetes mellitus, type 2 ([place])   . DVT (deep  venous thrombosis) ([place])   . Essential hypertension   . MI (myocardial infarction) ([place])   . Morbid obesity ([place])   . Panic disorder   . Prediabetes   . Shoulder pain, left    Following fall  . Vitamin D deficiency     Past Surgical History:  Procedure Laterality Date  . Ankle fracture sugery Left   . COLONOSCOPY WITH PROPOFOL N/A 05/29/2017   Dr. [name]: Multiple tubular adenomas removed, prep inadequate.  Short interval surveillance colonoscopy recommended in 6 months  . LEFT HEART CATH AND CORONARY ANGIOGRAPHY N/A 01/23/2018   Procedure: LEFT HEART CATH AND CORONARY ANGIOGRAPHY;  Surgeon: Troy Sine, MD;  Location: [place]  CV LAB;  Service: Cardiovascular;  Laterality: N/A;  . LUMBAR SPINE SURGERY     fusion  . POLYPECTOMY  05/29/2017   Procedure: POLYPECTOMY;  Surgeon: Daneil Dolin, MD;  Location[facility];  Service: Endoscopy;;  ascending colon polyp x5-cs transverse colon polyp x4- cs descending colon polyp x1- hs sigmoid colon polyp x1 -hs rectal polyp x1- hs  . TONSILLECTOMY      Social History   Socioeconomic History  . Marital status: Single    Spouse name: Not on file  . Number of children: Not on file  . Years of education: Not on file  . Highest education level: Not on file  Occupational History  . Not on file  Tobacco Use  . Smoking status: Never Smoker  . Smokeless tobacco: Never Used  Vaping Use  . Vaping Use: Never used  Substance and Sexual Activity  . Alcohol use: Yes    Comment: occ  . Drug use: Not Currently    Types: Marijuana    Comment: daily; 05/19/20 no marijuana in past 6 months d/t going to pain management clinic  . Sexual activity: Not Currently    Birth control/protection: None  Other Topics Concern  . Not on file  Social History Narrative  . Not on file   Social Determinants of Health   Financial Resource Strain: Medium Risk  . Difficulty of Paying Living Expenses: Somewhat hard  Food Insecurity: No Food Insecurity   . Worried About Charity fundraiser in the Last Year: Never true  . Ran Out of Food in the Last Year: Never true  Transportation Needs: No Transportation Needs  . Lack of Transportation (Medical): No  . Lack of Transportation (Non-Medical): No  Physical Activity: Not on file  Stress: Not on file  Social Connections: Unknown  . Frequency of Communication with Friends and Family: More than three times a week  . Frequency of Social Gatherings with Friends and Family: More than three times a week  . Attends Religious Services: Not on file  . Active Member of Clubs or Organizations: Not on file  . Attends Archivist Meetings: Not on file  . Marital Status: Not on file  Intimate Partner Violence: Not on file        Objective:    BP 110/66   Pulse 76   Temp (!) 96.8 F (36 C) (Temporal)   Ht 6' 1"  (1.854 m)   Wt (!) 409 lb 9.6 oz (185.8 kg)   SpO2 96%   BMI 54.04 kg/m   Wt Readings from Last 3 Encounters:  07/02/20 (!) 411 lb 6.4 oz (186.6 kg)  05/19/20 (!) 410 lb 12.8 oz (186.3 kg)  03/17/20 (!) 404 lb (183.3 kg)    Physical Exam Vitals reviewed.  Constitutional:      General: He is not in acute distress.    Appearance: Normal appearance. He is morbidly obese. He is not ill-appearing, toxic-appearing or diaphoretic.  HENT:     Head: Normocephalic and atraumatic.  Eyes:     General: No scleral icterus.       Right eye: No discharge.        Left eye: No discharge.     Conjunctiva/sclera: Conjunctivae normal.  Cardiovascular:     Rate and Rhythm: Normal rate and regular rhythm.     Heart sounds: Normal heart sounds. No murmur heard. No friction rub. No gallop.   Pulmonary:     Effort: Pulmonary effort is normal. No respiratory distress.     Breath sounds: Normal breath sounds. No stridor. No wheezing, rhonchi or rales.  Musculoskeletal:        General: Normal range of motion.     Cervical back: Normal range of motion.  Skin:    General: Skin is warm and  dry.  Neurological:     Mental Status: He is alert and oriented to person, place, and time. Mental status is at baseline.  Psychiatric:        Mood and Affect: Mood normal.        Behavior: Behavior normal.        Thought Content: Thought content normal.        Judgment: Judgment normal.     Lab Results  Component Value Date   TSH 2.840 05/13/2019   Lab Results  Component Value Date   WBC 10.2 12/16/2019   HGB 13.2 (A) 12/16/2019   HCT 42 12/16/2019   MCV 97.5 04/10/2019   PLT 272 12/16/2019   Lab Results  Component Value Date   NA 139 12/16/2019   K 4.6 12/16/2019   CO2 27 (A) 12/16/2019   GLUCOSE 141 (H) 04/10/2019   BUN 21 12/16/2019   CREATININE 1.3 12/16/2019   BILITOT 0.7 04/10/2019   ALKPHOS 75 12/16/2019   AST 18 12/16/2019   ALT 19 12/16/2019   PROT 7.3 04/10/2019   ALBUMIN 3.5 12/16/2019   CALCIUM 9.0 12/16/2019   ANIONGAP 10 04/10/2019   Lab Results  Component Value Date   CHOL 183 01/07/2019   Lab Results  Component Value Date   HDL 39 (  L) 01/07/2019   Lab Results  Component Value Date   LDLCALC 123 (H) 01/07/2019   Lab Results  Component Value Date   TRIG 114 01/07/2019   Lab Results  Component Value Date   CHOLHDL 4.7 01/07/2019   Lab Results  Component Value Date   HGBA1C 7.7 (H) 12/25/2019

## 2020-07-16 ENCOUNTER — Encounter (HOSPITAL_COMMUNITY): Payer: Self-pay

## 2020-07-16 ENCOUNTER — Telehealth: Payer: Self-pay

## 2020-07-16 ENCOUNTER — Other Ambulatory Visit (HOSPITAL_COMMUNITY)
Admission: RE | Admit: 2020-07-16 | Discharge: 2020-07-16 | Disposition: A | Discharge status: Home / Self Care | Payer: Medicaid Other | Source: Ambulatory Visit | Attending: Internal Medicine | Admitting: Internal Medicine

## 2020-07-16 ENCOUNTER — Encounter (HOSPITAL_COMMUNITY)
Admission: RE | Admit: 2020-07-16 | Discharge: 2020-07-16 | Disposition: A | Discharge status: Home / Self Care | Payer: Medicaid Other | Source: Ambulatory Visit | Attending: Internal Medicine | Admitting: Internal Medicine

## 2020-07-16 DIAGNOSIS — Z20822 Contact with and (suspected) exposure to covid-19: Secondary | ICD-10-CM | POA: Insufficient documentation

## 2020-07-16 DIAGNOSIS — Z01812 Encounter for preprocedural laboratory examination: Secondary | ICD-10-CM | POA: Insufficient documentation

## 2020-07-16 DIAGNOSIS — E1165 Type 2 diabetes mellitus with hyperglycemia: Secondary | ICD-10-CM

## 2020-07-16 LAB — LIPID PANEL
Chol/HDL Ratio: 4.9 ratio (ref 0.0–5.0)
Cholesterol, Total: 192 mg/dL (ref 100–199)
HDL: 39 mg/dL — ABNORMAL LOW (ref >39)
LDL Chol Calc (NIH): 128 mg/dL — ABNORMAL HIGH (ref 0–99)
Triglycerides: 136 mg/dL (ref 0–149)
VLDL Cholesterol Cal: 25 mg/dL (ref 5–40)

## 2020-07-16 LAB — CBC WITH DIFFERENTIAL/PLATELET
Basophils Absolute: 0.1 10*3/uL (ref 0.0–0.2)
Basos: 1 %
EOS (ABSOLUTE): 0.3 10*3/uL (ref 0.0–0.4)
Eos: 3 %
Hematocrit: 44.4 % (ref 37.5–51.0)
Hemoglobin: 14.5 g/dL (ref 13.0–17.7)
Immature Grans (Abs): 0.1 10*3/uL (ref 0.0–0.1)
Immature Granulocytes: 1 %
Lymphocytes Absolute: 1 10*3/uL (ref 0.7–3.1)
Lymphs: 12 %
MCH: 31.5 pg (ref 26.6–33.0)
MCHC: 32.7 g/dL (ref 31.5–35.7)
MCV: 96 fL (ref 79–97)
Monocytes Absolute: 0.8 10*3/uL (ref 0.1–0.9)
Monocytes: 9 %
Neutrophils Absolute: 6.6 10*3/uL (ref 1.4–7.0)
Neutrophils: 74 %
Platelets: 303 10*3/uL (ref 150–450)
RBC: 4.61 x10E6/uL (ref 4.14–5.80)
RDW: 12.6 % (ref 11.6–15.4)
WBC: 8.9 10*3/uL (ref 3.4–10.8)

## 2020-07-16 LAB — SARS CORONAVIRUS 2 (TAT 6-24 HRS): SARS Coronavirus 2: NEGATIVE

## 2020-07-16 LAB — CMP14+EGFR
ALT: 22 IU/L (ref 0–44)
AST: 19 IU/L (ref 0–40)
Albumin/Globulin Ratio: 1.3 (ref 1.2–2.2)
Albumin: 4 g/dL (ref 3.8–4.9)
Alkaline Phosphatase: 84 IU/L (ref 44–121)
BUN/Creatinine Ratio: 15 (ref 9–20)
BUN: 21 mg/dL (ref 6–24)
Bilirubin Total: 0.3 mg/dL (ref 0.0–1.2)
CO2: 28 mmol/L (ref 20–29)
Calcium: 9.2 mg/dL (ref 8.7–10.2)
Chloride: 97 mmol/L (ref 96–106)
Creatinine, Ser: 1.4 mg/dL — ABNORMAL HIGH (ref 0.76–1.27)
Globulin, Total: 3 g/dL (ref 1.5–4.5)
Glucose: 221 mg/dL — ABNORMAL HIGH (ref 65–99)
Potassium: 4.4 mmol/L (ref 3.5–5.2)
Sodium: 141 mmol/L (ref 134–144)
Total Protein: 7 g/dL (ref 6.0–8.5)
eGFR: 58 mL/min/{1.73_m2} — ABNORMAL LOW (ref >59)

## 2020-07-16 LAB — GLUCOSE, CAPILLARY: Glucose-Capillary: 229 mg/dL — ABNORMAL HIGH (ref 70–99)

## 2020-07-16 NOTE — Telephone Encounter (Signed)
PA received Rybelsus 3MG  tablets  Key: [id number] Rx #: [id number]  Sent to plan

## 2020-07-16 NOTE — Telephone Encounter (Signed)
Status: Denied. Notification: Completed

## 2020-07-17 MED ORDER — SITAGLIPTIN PHOSPHATE 25 MG PO TABS
25.0000 mg | ORAL_TABLET | Freq: Every day | ORAL | 2 refills | Status: DC
Start: 2020-07-17 — End: 2020-07-20

## 2020-07-17 NOTE — Telephone Encounter (Signed)
Patient aware and verbalizes understanding. 

## 2020-07-17 NOTE — Telephone Encounter (Signed)
Please let patient know his insurance denied the new medication, Rybelsus, I sent in. I D/C'd it and rx'd Januvia 25 mg once daily.

## 2020-07-17 NOTE — Addendum Note (Signed)
Addended by: [name] on: 07/17/2020 12:21 PM   Modules accepted: Orders

## 2020-07-20 ENCOUNTER — Ambulatory Visit (HOSPITAL_COMMUNITY)
Admission: RE | Admit: 2020-07-20 | Discharge: 2020-07-20 | Disposition: A | Discharge status: Home / Self Care | Payer: Medicaid Other | Attending: Internal Medicine | Admitting: Internal Medicine

## 2020-07-20 ENCOUNTER — Ambulatory Visit (HOSPITAL_COMMUNITY): Payer: Medicaid Other | Admitting: Anesthesiology

## 2020-07-20 ENCOUNTER — Telehealth: Payer: Self-pay

## 2020-07-20 ENCOUNTER — Encounter (HOSPITAL_COMMUNITY)
Admission: RE | Disposition: A | Discharge status: Home / Self Care | Payer: Self-pay | Source: Home / Self Care | Attending: Internal Medicine

## 2020-07-20 DIAGNOSIS — K635 Polyp of colon: Secondary | ICD-10-CM | POA: Diagnosis not present

## 2020-07-20 DIAGNOSIS — Z1211 Encounter for screening for malignant neoplasm of colon: Secondary | ICD-10-CM | POA: Insufficient documentation

## 2020-07-20 DIAGNOSIS — K5730 Diverticulosis of large intestine without perforation or abscess without bleeding: Secondary | ICD-10-CM | POA: Insufficient documentation

## 2020-07-20 DIAGNOSIS — Z7984 Long term (current) use of oral hypoglycemic drugs: Secondary | ICD-10-CM | POA: Diagnosis not present

## 2020-07-20 DIAGNOSIS — Z791 Long term (current) use of non-steroidal anti-inflammatories (NSAID): Secondary | ICD-10-CM | POA: Insufficient documentation

## 2020-07-20 DIAGNOSIS — Z6841 Body Mass Index (BMI) 40.0 and over, adult: Secondary | ICD-10-CM | POA: Diagnosis not present

## 2020-07-20 DIAGNOSIS — Z8379 Family history of other diseases of the digestive system: Secondary | ICD-10-CM | POA: Insufficient documentation

## 2020-07-20 DIAGNOSIS — Z7901 Long term (current) use of anticoagulants: Secondary | ICD-10-CM | POA: Insufficient documentation

## 2020-07-20 DIAGNOSIS — E119 Type 2 diabetes mellitus without complications: Secondary | ICD-10-CM | POA: Diagnosis not present

## 2020-07-20 DIAGNOSIS — D120 Benign neoplasm of cecum: Secondary | ICD-10-CM

## 2020-07-20 DIAGNOSIS — Z86718 Personal history of other venous thrombosis and embolism: Secondary | ICD-10-CM | POA: Diagnosis not present

## 2020-07-20 DIAGNOSIS — J449 Chronic obstructive pulmonary disease, unspecified: Secondary | ICD-10-CM | POA: Diagnosis not present

## 2020-07-20 DIAGNOSIS — Z800 Family history of malignant neoplasm of digestive organs: Secondary | ICD-10-CM | POA: Insufficient documentation

## 2020-07-20 DIAGNOSIS — Z8249 Family history of ischemic heart disease and other diseases of the circulatory system: Secondary | ICD-10-CM | POA: Insufficient documentation

## 2020-07-20 DIAGNOSIS — Z86010 Personal history of colonic polyps: Secondary | ICD-10-CM | POA: Diagnosis not present

## 2020-07-20 DIAGNOSIS — Z8673 Personal history of transient ischemic attack (TIA), and cerebral infarction without residual deficits: Secondary | ICD-10-CM | POA: Diagnosis not present

## 2020-07-20 DIAGNOSIS — Z888 Allergy status to other drugs, medicaments and biological substances status: Secondary | ICD-10-CM | POA: Diagnosis not present

## 2020-07-20 DIAGNOSIS — E1165 Type 2 diabetes mellitus with hyperglycemia: Secondary | ICD-10-CM

## 2020-07-20 HISTORY — PX: BIOPSY: SHX5522

## 2020-07-20 HISTORY — PX: COLONOSCOPY WITH PROPOFOL: SHX5780

## 2020-07-20 LAB — GLUCOSE, CAPILLARY
Glucose-Capillary: 219 mg/dL — ABNORMAL HIGH (ref 70–99)
Glucose-Capillary: 192 mg/dL — ABNORMAL HIGH (ref 70–99)

## 2020-07-20 SURGERY — COLONOSCOPY WITH PROPOFOL
Anesthesia: General

## 2020-07-20 MED ORDER — KETAMINE HCL 50 MG/5ML IJ SOSY
PREFILLED_SYRINGE | INTRAMUSCULAR | Status: AC
  Filled 2020-07-20: qty 5

## 2020-07-20 MED ORDER — PROPOFOL 10 MG/ML IV BOLUS
INTRAVENOUS | Status: DC | PRN
  Administered 2020-07-20: 80 mg via INTRAVENOUS

## 2020-07-20 MED ORDER — LIDOCAINE HCL (CARDIAC) PF 100 MG/5ML IV SOSY
PREFILLED_SYRINGE | INTRAVENOUS | Status: DC | PRN
  Administered 2020-07-20: 50 mg via INTRAVENOUS

## 2020-07-20 MED ORDER — METFORMIN HCL ER 500 MG PO TB24: ORAL_TABLET | ORAL | 2 refills | Status: DC

## 2020-07-20 MED ORDER — KETAMINE HCL 10 MG/ML IJ SOLN
INTRAMUSCULAR | Status: DC | PRN
  Administered 2020-07-20: 20 mg via INTRAVENOUS

## 2020-07-20 MED ORDER — PROPOFOL 500 MG/50ML IV EMUL
INTRAVENOUS | Status: DC | PRN
  Administered 2020-07-20: 100 ug/kg/min via INTRAVENOUS

## 2020-07-20 MED ORDER — LACTATED RINGERS IV SOLN: INTRAVENOUS | Status: DC | PRN

## 2020-07-20 NOTE — Op Note (Signed)
[facility] Patient Name: Bradley Torres Procedure Date: 07/20/2020 8:08 AM MRN: [id number] Date of Birth: [birth year] Attending MD: Norvel Richards , MD CSN: [id number] Age: 60 Admit Type: Outpatient Procedure:                Colonoscopy Indications:              High risk colon cancer surveillance: Personal                            history of colonic polyps Providers:                Norvel Richards, MD, Janeece Riggers, RN, Raphael Gibney, Technician Referring MD:             Terald Sleeper Medicines:                Propofol per Anesthesia Complications:            No immediate complications. Estimated Blood Loss:     Estimated blood loss was minimal. Procedure:                Pre-Anesthesia Assessment:                           - Prior to the procedure, a History and Physical                            was performed, and patient medications and                            allergies were reviewed. The patient's tolerance of                            previous anesthesia was also reviewed. The risks                            and benefits of the procedure and the sedation                            options and risks were discussed with the patient.                            All questions were answered, and informed consent                            was obtained. Prior Anticoagulants: The patient                            last took Xarelto (rivaroxaban) 4 days prior to the                            procedure. ASA Grade Assessment: IV - A patient  with severe systemic disease that is a constant                            threat to life. After reviewing the risks and                            benefits, the patient was deemed in satisfactory                            condition to undergo the procedure.                           After obtaining informed consent, the colonoscope                            was passed under  direct vision. Throughout the                            procedure, the patient's blood pressure, pulse, and                            oxygen saturations were monitored continuously. The                            CF-HQ190L [id number]) scope was introduced through                            the anus and advanced to the the cecum, identified                            by appendiceal orifice and ileocecal valve. The                            colonoscopy was performed without difficulty. The                            patient tolerated the procedure well. The ileocecal                            valve, appendiceal orifice, and rectum were                            photographed. Scope In: 8:21:39 AM Scope Out: 8:33:04 AM Scope Withdrawal Time: 0 hours 6 minutes 48 seconds  Total Procedure Duration: 0 hours 11 minutes 25 seconds  Findings:      The perianal and digital rectal examinations were normal.      A 2 mm polyp was found in the cecum. The polyp was sessile. The polyp       was removed with a cold biopsy forceps. Resection and retrieval were       complete. Estimated blood loss was minimal.      Scattered medium-mouthed diverticula were found in the entire colon.      The exam was otherwise without abnormality on direct and retroflexion  views. Impression:               - One 2 mm polyp in the cecum, removed with a cold                            biopsy forceps. Resected and retrieved.                           - Diverticulosis in the entire examined colon.                           - The examination was otherwise normal on direct                            and retroflexion views. Moderate Sedation:      Moderate (conscious) sedation was personally administered by an       anesthesia professional. The following parameters were monitored: oxygen       saturation, heart rate, blood pressure, respiratory rate, EKG, adequacy       of pulmonary ventilation, and response to  care. Recommendation:           - Patient has a contact number available for                            emergencies. The signs and symptoms of potential                            delayed complications were discussed with the                            patient. Return to normal activities tomorrow.                            Written discharge instructions were provided to the                            patient.                           - Resume previous diet.                           - Continue present medications.                           - Await pathology results.                           - Repeat colonoscopy date to be determined after                            pending pathology results are reviewed for                            surveillance.                           -  Return to GI office (date not yet determined).                            Resume Xarelto today. Procedure Code(s):        --- Professional ---                           [phone], Colonoscopy, flexible; with biopsy, single                            or multiple Diagnosis Code(s):        --- Professional ---                           Z86.010, Personal history of colonic polyps                           K63.5, Polyp of colon                           K57.30, Diverticulosis of large intestine without                            perforation or abscess without bleeding CPT copyright 2019 [employer]. All rights reserved. The codes documented in this report are preliminary and upon coder review may  be revised to meet current compliance requirements. Cristopher Estimable. Alaney Witter, MD Norvel Richards, MD 07/20/2020 8:46:24 AM This report has been signed electronically. Number of Addenda: 0

## 2020-07-20 NOTE — Anesthesia Preprocedure Evaluation (Addendum)
Anesthesia Evaluation  Patient identified by MRN, date of birth, ID band Patient awake    Reviewed: Allergy & Precautions, NPO status , Patient's Chart, lab work & pertinent test results  History of Anesthesia Complications Negative for: history of anesthetic complications  Airway Mallampati: III  TM Distance: >3 FB Neck ROM: Full    Dental  (+) Dental Advisory Given, Missing   Pulmonary sleep apnea (noncomplaint with CPAP) , COPD, PE   Pulmonary exam normal breath sounds clear to auscultation       Cardiovascular hypertension, Pt. on medications + CAD, + Past MI, +CHF and + DVT   Rhythm:Irregular Rate:Normal     Neuro/Psych PSYCHIATRIC DISORDERS Anxiety CVA    GI/Hepatic negative GI ROS, Neg liver ROS,   Endo/Other  diabetes, Well Controlled, Type 2, Oral Hypoglycemic AgentsMorbid obesity  Renal/GU Renal InsufficiencyRenal disease     Musculoskeletal  (+) Arthritis ,   Abdominal   Peds  Hematology   Anesthesia Other Findings   Reproductive/Obstetrics                          Anesthesia Physical Anesthesia Plan  ASA: IV  Anesthesia Plan: General   Post-op Pain Management:    Induction: Intravenous  PONV Risk Score and Plan: Propofol infusion  Airway Management Planned: Nasal Cannula, Natural Airway and Simple Face Mask  Additional Equipment:   Intra-op Plan:   Post-operative Plan:   Informed Consent: I have reviewed the patients History and Physical, chart, labs and discussed the procedure including the risks, benefits and alternatives for the proposed anesthesia with the patient or authorized representative who has indicated his/her understanding and acceptance.     Dental advisory given  Plan Discussed with: CRNA and Surgeon  Anesthesia Plan Comments:        Anesthesia Quick Evaluation

## 2020-07-20 NOTE — Telephone Encounter (Signed)
Please let patient know Januvia was not covered either.  I have sent metformin.  Please follow directions on the bottle.

## 2020-07-20 NOTE — H&P (Signed)
@LOGO @   Primary Care Physician:  [name], [name] Primary Gastroenterologist:  Dr. [name]  Pre-Procedure History & Physical: HPI:  Bradley Torres is a 60 y.o. male here for surveillance colonoscopy.  History of multiple colonic adenomas previously removed.  Super morbidly obese. Last Xarelto 4/21.  Past Medical History:  Diagnosis Date  . CAD (coronary artery disease)    a. 12/2017: cath showing 10% Proximal-LAD stenosis with no significant obstructive disease and LVEDP mildly elevated at 18 mm Hg.   Marland Kitchen Chronic back pain   . Diabetes mellitus, type 2 ([place])   . DVT (deep venous thrombosis) ([place])   . Essential hypertension   . MI (myocardial infarction) ([place]) 2019  . Morbid obesity ([place])   . Panic disorder   . Shoulder pain, left    Following fall  . Sleep apnea   . Stroke ([place]) 01/2018  . Vitamin D deficiency     Past Surgical History:  Procedure Laterality Date  . Ankle fracture sugery Left   . COLONOSCOPY WITH PROPOFOL N/A 05/29/2017   Dr. [name]: Multiple tubular adenomas removed, prep inadequate.  Short interval surveillance colonoscopy recommended in 6 months  . LEFT HEART CATH AND CORONARY ANGIOGRAPHY N/A 01/23/2018   Procedure: LEFT HEART CATH AND CORONARY ANGIOGRAPHY;  Surgeon: Troy Sine, MD;  Location: [facility];  Service: Cardiovascular;  Laterality: N/A;  . LUMBAR SPINE SURGERY     fusion  . POLYPECTOMY  05/29/2017   Procedure: POLYPECTOMY;  Surgeon: Daneil Dolin, MD;  Location: [facility];  Service: Endoscopy;;  ascending colon polyp x5-cs transverse colon polyp x4- cs descending colon polyp x1- hs sigmoid colon polyp x1 -hs rectal polyp x1- hs  . TONSILLECTOMY      Prior to Admission medications   Medication Sig Start Date End Date Taking? Authorizing Provider  chlorthalidone (HYGROTON) 25 MG tablet Take 1 tablet (25 mg total) by mouth daily. 05/21/20 08/19/20 Yes Skeet Latch, MD  dapagliflozin propanediol (FARXIGA) 10 MG TABS  tablet Take 1 tablet (10 mg total) by mouth daily before breakfast. 07/15/20  Yes Hendricks Limes F, [name]  gabapentin (NEURONTIN) 300 MG capsule Take 1 capsule by mouth daily as needed (pain). 04/22/20  Yes [provider]  hydrALAZINE (APRESOLINE) 50 MG tablet Take 50 mg by mouth daily at 2 PM.   Yes [provider]  oxyCODONE-acetaminophen (PERCOCET) 10-325 MG tablet Take 1 tablet by mouth 3 (three) times daily as needed for pain.   Yes [provider]  valsartan (DIOVAN) 320 MG tablet Take 1 tablet (320 mg total) by mouth daily. Patient taking differently: Take 160 mg by mouth daily. 04/29/20  Yes Skeet Latch, MD  XARELTO 20 MG TABS tablet TAKE ONE TABLET BY MOUTH DAILY WITH SUPPER Patient taking differently: Take 20 mg by mouth daily with supper. 06/22/20  Yes Hendricks Limes F, [name]  atorvastatin (LIPITOR) 10 MG tablet Take 1 tablet (10 mg total) by mouth daily. 07/15/20   [name], [name]  glucose blood (ONETOUCH VERIO) test strip USE TO TEST BLOOD SUGAR DAILY AS DIRECTED. DX: 11.9 01/07/20   [name], [name]  OneTouch Delica Lancets 09X MISC USE TO TEST BLOOD SUGAR DAILY AS DIRECTED. DX: 11.9 01/07/20   [name], [name]  sitaGLIPtin (JANUVIA) 25 MG tablet Take 1 tablet (25 mg total) by mouth daily. 07/17/20   [name], [name]    Allergies as of 05/25/2020 - Review Complete 05/21/2020  Allergen Reaction  Noted  . Lisinopril Other (See Comments) 05/13/2019  . Bystolic [nebivolol hcl] Swelling 03/13/2020  . Ibuprofen Other (See Comments) 11/20/2013  . Spironolactone Hives 12/25/2019  . Amlodipine Itching and Other (See Comments) 12/15/2019  . Diflucan [fluconazole] Rash 01/19/2018  . Hctz [hydrochlorothiazide] Other (See Comments) 12/15/2019  . Losartan Rash 03/17/2016    Family History  Problem Relation Age of Onset  . Colon cancer Mother        died at age 25  . Heart attack Father   . Hepatitis Sister 42       Autoimmune  . Heart attack  Paternal Grandfather     Social History   Socioeconomic History  . Marital status: Single    Spouse name: Not on file  . Number of children: Not on file  . Years of education: Not on file  . Highest education level: Not on file  Occupational History  . Not on file  Tobacco Use  . Smoking status: Never Smoker  . Smokeless tobacco: Never Used  Vaping Use  . Vaping Use: Never used  Substance and Sexual Activity  . Alcohol use: Yes    Comment: occ  . Drug use: Not Currently    Types: Marijuana    Comment: daily; 05/19/20 no marijuana in past 6 months d/t going to pain management clinic  . Sexual activity: Not Currently    Birth control/protection: None  Other Topics Concern  . Not on file  Social History Narrative  . Not on file   Social Determinants of Health   Financial Resource Strain: Medium Risk  . Difficulty of Paying Living Expenses: Somewhat hard  Food Insecurity: No Food Insecurity  . Worried About Charity fundraiser in the Last Year: Never true  . Ran Out of Food in the Last Year: Never true  Transportation Needs: No Transportation Needs  . Lack of Transportation (Medical): No  . Lack of Transportation (Non-Medical): No  Physical Activity: Not on file  Stress: Not on file  Social Connections: Unknown  . Frequency of Communication with Friends and Family: More than three times a week  . Frequency of Social Gatherings with Friends and Family: More than three times a week  . Attends Religious Services: Not on file  . Active Member of Clubs or Organizations: Not on file  . Attends Archivist Meetings: Not on file  . Marital Status: Not on file  Intimate Partner Violence: Not on file    Review of Systems: See HPI, otherwise negative ROS  Physical Exam: BP 128/67   Pulse 87   Temp 98.3 F (36.8 C) (Oral)   Resp 18   SpO2 97%  General:   Alert,  Well-developed, well-nourished, pleasant and cooperative in NAD Neck:  Supple; no masses or  thyromegaly. No significant cervical adenopathy. Lungs:  Clear throughout to auscultation.   No wheezes, crackles, or rhonchi. No acute distress. Heart:  Regular rate and rhythm; no murmurs, clicks, rubs,  or gallops. Abdomen: Obese.  Soft and nontender.   Pulses:  Normal pulses noted. Extremities:  Without clubbing or edema.  Impression/Plan: 60 year old morbidly obese individual here for surveillance colonoscopy.  History of multiple colonic adenomas removed previously. The risks, benefits, limitations, alternatives and imponderables have been reviewed with the patient. Questions have been answered. All parties are agreeable.   Discussed with anesthesia.     Notice: This dictation was prepared with Dragon dictation along with smaller phrase technology. Any transcriptional errors that result from this  process are unintentional and may not be corrected upon review.

## 2020-07-20 NOTE — Telephone Encounter (Signed)
Denied today PA Case: [id number], Status: Denied. Notification: Completed

## 2020-07-20 NOTE — Transfer of Care (Signed)
Immediate Anesthesia Transfer of Care Note  Patient: Bradley Torres  Procedure(s) Performed: COLONOSCOPY WITH PROPOFOL (N/A ) BIOPSY  Patient Location: Short Stay  Anesthesia Type:General  Level of Consciousness: awake, alert  and oriented  Airway & Oxygen Therapy: Patient Spontanous Breathing  Post-op Assessment: Report given to RN, Post -op Vital signs reviewed and stable, Patient moving all extremities and Patient moving all extremities X 4  Post vital signs: Reviewed and stable  Last Vitals:  Vitals Value Taken Time  BP    Temp    Pulse    Resp    SpO2      Last Pain:  Vitals:   07/20/20 0817  TempSrc:   PainSc: 6          Complications: No complications documented.

## 2020-07-20 NOTE — Anesthesia Postprocedure Evaluation (Signed)
Anesthesia Post Note  Patient: Bradley Torres  Procedure(s) Performed: COLONOSCOPY WITH PROPOFOL (N/A ) BIOPSY  Patient location during evaluation: Phase II Anesthesia Type: General Level of consciousness: awake and alert and oriented Pain management: pain level controlled Vital Signs Assessment: post-procedure vital signs reviewed and stable Respiratory status: spontaneous breathing and respiratory function stable Cardiovascular status: blood pressure returned to baseline and stable Postop Assessment: no apparent nausea or vomiting Anesthetic complications: no   No complications documented.   Last Vitals:  Vitals:   07/20/20 0734 07/20/20 0839  BP: 128/67 104/63  Pulse: 87 80  Resp: 18 20  Temp: 36.8 C 36.7 C  SpO2: 97% 96%    Last Pain:  Vitals:   07/20/20 0839  TempSrc: Oral  PainSc: 2                  [name]

## 2020-07-20 NOTE — Addendum Note (Signed)
Addended by: [name] on: 07/20/2020 06:52 PM   Modules accepted: Orders

## 2020-07-20 NOTE — Discharge Instructions (Signed)
Colonoscopy Discharge Instructions  Read the instructions outlined below and refer to this sheet in the next few weeks. These discharge instructions provide you with general information on caring for yourself after you leave the hospital. Your doctor may also give you specific instructions. While your treatment has been planned according to the most current medical practices available, unavoidable complications occasionally occur. If you have any problems or questions after discharge, call Dr. [name] at [phone]. ACTIVITY  You may resume your regular activity, but move at a slower pace for the next 24 hours.   Take frequent rest periods for the next 24 hours.   Walking will help get rid of the air and reduce the bloated feeling in your belly (abdomen).   No driving for 24 hours (because of the medicine (anesthesia) used during the test).    Do not sign any important legal documents or operate any machinery for 24 hours (because of the anesthesia used during the test).  NUTRITION  Drink plenty of fluids.   You may resume your normal diet as instructed by your doctor.   Begin with a light meal and progress to your normal diet. Heavy or fried foods are harder to digest and may make you feel sick to your stomach (nauseated).   Avoid alcoholic beverages for 24 hours or as instructed.  MEDICATIONS  You may resume your normal medications unless your doctor tells you otherwise.  WHAT YOU CAN EXPECT TODAY  Some feelings of bloating in the abdomen.   Passage of more gas than usual.   Spotting of blood in your stool or on the toilet paper.  IF YOU HAD POLYPS REMOVED DURING THE COLONOSCOPY:  No aspirin products for 7 days or as instructed.   No alcohol for 7 days or as instructed.   Eat a soft diet for the next 24 hours.  FINDING OUT THE RESULTS OF YOUR TEST Not all test results are available during your visit. If your test results are not back during the visit, make an appointment  with your caregiver to find out the results. Do not assume everything is normal if you have not heard from your caregiver or the medical facility. It is important for you to follow up on all of your test results.  SEEK IMMEDIATE MEDICAL ATTENTION IF:  You have more than a spotting of blood in your stool.   Your belly is swollen (abdominal distention).   You are nauseated or vomiting.   You have a temperature over 101.   You have abdominal pain or discomfort that is severe or gets worse throughout the day.    Colon Polyps  Colon polyps are tissue growths inside the colon, which is part of the large intestine. They are one of the types of polyps that can grow in the body. A polyp may be a round bump or a mushroom-shaped growth. You could have one polyp or more than one. Most colon polyps are noncancerous (benign). However, some colon polyps can become cancerous over time. Finding and removing the polyps early can help prevent this. What are the causes? The exact cause of colon polyps is not known. What increases the risk? The following factors may make you more likely to develop this condition:  Having a family history of colorectal cancer or colon polyps.  Being older than 58 years of age.  Being younger than 59 years of age and having a significant family history of colorectal cancer or colon polyps or a genetic condition that  puts you at higher risk of getting colon polyps.  Having inflammatory bowel disease, such as ulcerative colitis or Crohn's disease.  Having certain conditions passed from parent to child (hereditary conditions), such as: ? Familial adenomatous polyposis (FAP). ? Lynch syndrome. ? Turcot syndrome. ? Peutz-Jeghers syndrome. ? MUTYH-associated polyposis (MAP).  Being overweight.  Certain lifestyle factors. These include smoking cigarettes, drinking too much alcohol, not getting enough exercise, and eating a diet that is high in fat and red meat and low in  fiber.  Having had childhood cancer that was treated with radiation of the abdomen. What are the signs or symptoms? Many times, there are no symptoms. If you have symptoms, they may include:  Blood coming from the rectum during a bowel movement.  Blood in the stool (feces). The blood may be bright red or very dark in color.  Pain in the abdomen.  A change in bowel habits, such as constipation or diarrhea. How is this diagnosed? This condition is diagnosed with a colonoscopy. This is a procedure in which a lighted, flexible scope is inserted into the opening between the buttocks (anus) and then passed into the colon to examine the area. Polyps are sometimes found when a colonoscopy is done as part of routine cancer screening tests. How is this treated? This condition is treated by removing any polyps that are found. Most polyps can be removed during a colonoscopy. Those polyps will then be tested for cancer. Additional treatment may be needed depending on the results of testing. Follow these instructions at home: Eating and drinking  Eat foods that are high in fiber, such as fruits, vegetables, and whole grains.  Eat foods that are high in calcium and vitamin D, such as milk, cheese, yogurt, eggs, liver, fish, and broccoli.  Limit foods that are high in fat, such as fried foods and desserts.  Limit the amount of red meat, precooked or cured meat, or other processed meat that you eat, such as hot dogs, sausages, bacon, or meat loaves.  Limit sugary drinks.   Lifestyle  Maintain a healthy weight, or lose weight if recommended by your health care provider.  Exercise every day or as told by your health care provider.  Do not use any products that contain nicotine or tobacco, such as cigarettes, e-cigarettes, and chewing tobacco. If you need help quitting, ask your health care provider.  Do not drink alcohol if: ? Your health care provider tells you not to drink. ? You are pregnant,  may be pregnant, or are planning to become pregnant.  If you drink alcohol: ? Limit how much you use to:  0-1 drink a day for women.  0-2 drinks a day for men. ? Know how much alcohol is in your drink. In the [place] one drink equals one 12 oz bottle of beer (355 mL), one 5 oz glass of wine (148 mL), or one 1 oz glass of hard liquor (44 mL). General instructions  Take over-the-counter and prescription medicines only as told by your health care provider.  Keep all follow-up visits. This is important. This includes having regularly scheduled colonoscopies. Talk to your health care provider about when you need a colonoscopy. Contact a health care provider if:  You have new or worsening bleeding during a bowel movement.  You have new or increased blood in your stool.  You have a change in bowel habits.  You lose weight for no known reason. Summary  Colon polyps are tissue growths inside the colon,  which is part of the large intestine. They are one type of polyp that can grow in the body.  Most colon polyps are noncancerous (benign), but some can become cancerous over time.  This condition is diagnosed with a colonoscopy.  This condition is treated by removing any polyps that are found. Most polyps can be removed during a colonoscopy. This information is not intended to replace advice given to you by your health care provider. Make sure you discuss any questions you have with your health care provider. Document Revised: 07/03/2019 Document Reviewed: 07/03/2019 Elsevier Patient Education  2021 [place].   Diverticulosis  Diverticulosis is a condition that develops when small pouches (diverticula) form in the wall of the large intestine (colon). The colon is where water is absorbed and stool (feces) is formed. The pouches form when the inside layer of the colon pushes through weak spots in the outer layers of the colon. You may have a few pouches or many of them. The pouches  usually do not cause problems unless they become inflamed or infected. When this happens, the condition is called diverticulitis. What are the causes? The cause of this condition is not known. What increases the risk? The following factors may make you more likely to develop this condition:  Being older than age 42. Your risk for this condition increases with age. Diverticulosis is rare among people younger than age 41. By age 69, many people have it.  Eating a low-fiber diet.  Having frequent constipation.  Being overweight.  Not getting enough exercise.  Smoking.  Taking over-the-counter pain medicines, like aspirin and ibuprofen.  Having a family history of diverticulosis. What are the signs or symptoms? In most people, there are no symptoms of this condition. If you do have symptoms, they may include:  Bloating.  Cramps in the abdomen.  Constipation or diarrhea.  Pain in the lower left side of the abdomen. How is this diagnosed? Because diverticulosis usually has no symptoms, it is most often diagnosed during an exam for other colon problems. The condition may be diagnosed by:  Using a flexible scope to examine the colon (colonoscopy).  Taking an X-ray of the colon after dye has been put into the colon (barium enema).  Having a CT scan. How is this treated? You may not need treatment for this condition. Your health care provider may recommend treatment to prevent problems. You may need treatment if you have symptoms or if you previously had diverticulitis. Treatment may include:  Eating a high-fiber diet.  Taking a fiber supplement.  Taking a live bacteria supplement (probiotic).  Taking medicine to relax your colon.   Follow these instructions at home: Medicines  Take over-the-counter and prescription medicines only as told by your health care provider.  If told by your health care provider, take a fiber supplement or probiotic. Constipation  prevention Your condition may cause constipation. To prevent or treat constipation, you may need to:  Drink enough fluid to keep your urine pale yellow.  Take over-the-counter or prescription medicines.  Eat foods that are high in fiber, such as beans, whole grains, and fresh fruits and vegetables.  Limit foods that are high in fat and processed sugars, such as fried or sweet foods.   General instructions  Try not to strain when you have a bowel movement.  Keep all follow-up visits as told by your health care provider. This is important. Contact a health care provider if you:  Have pain in your abdomen.  Have bloating.  Have cramps.  Have not had a bowel movement in 3 days. Get help right away if:  Your pain gets worse.  Your bloating becomes very bad.  You have a fever or chills, and your symptoms suddenly get worse.  You vomit.  You have bowel movements that are bloody or black.  You have bleeding from your rectum. Summary  Diverticulosis is a condition that develops when small pouches (diverticula) form in the wall of the large intestine (colon).  You may have a few pouches or many of them.  This condition is most often diagnosed during an exam for other colon problems.  Treatment may include increasing the fiber in your diet, taking supplements, or taking medicines. This information is not intended to replace advice given to you by your health care provider. Make sure you discuss any questions you have with your health care provider. Document Revised: 10/11/2018 Document Reviewed: 10/11/2018 Elsevier Patient Education  2021 [place] [employer]   Diverticulosis and colon polyp information provided  Further recommendations to follow pending review of pathology report  Patient request, called Philis Pique at [phone] -was told wrong number.         High-Fiber Eating Plan Fiber, also called dietary fiber, is a type of  carbohydrate. It is found foods such as fruits, vegetables, whole grains, and beans. A high-fiber diet can have many health benefits. Your health care provider may recommend a high-fiber diet to help:  Prevent constipation. Fiber can make your bowel movements more regular.  Lower your cholesterol.  Relieve the following conditions: ? Inflammation of veins in the anus (hemorrhoids). ? Inflammation of specific areas of the digestive tract (uncomplicated diverticulosis). ? A problem of the large intestine, also called the colon, that sometimes causes pain and diarrhea (irritable bowel syndrome, or IBS).  Prevent overeating as part of a weight-loss plan.  Prevent heart disease, type 2 diabetes, and certain cancers. What are tips for following this plan? Reading food labels  Check the nutrition facts label on food products for the amount of dietary fiber. Choose foods that have 5 grams of fiber or more per serving.  The goals for recommended daily fiber intake include: ? Men (age 33 or younger): 34-38 g. ? Men (over age 41): 28-34 g. ? Women (age 28 or younger): 25-28 g. ? Women (over age 27): 22-25 g. Your daily fiber goal is _____________ g.   Shopping  Choose whole fruits and vegetables instead of processed forms, such as apple juice or applesauce.  Choose a wide variety of high-fiber foods such as avocados, lentils, oats, and kidney beans.  Read the nutrition facts label of the foods you choose. Be aware of foods with added fiber. These foods often have high sugar and sodium amounts per serving. Cooking  Use whole-grain flour for baking and cooking.  Cook with brown rice instead of white rice. Meal planning  Start the day with a breakfast that is high in fiber, such as a cereal that contains 5 g of fiber or more per serving.  Eat breads and cereals that are made with whole-grain flour instead of refined flour or white flour.  Eat brown rice, bulgur wheat, or millet instead  of white rice.  Use beans in place of meat in soups, salads, and pasta dishes.  Be sure that half of the grains you eat each day are whole grains. General information  You can get the recommended daily intake of dietary  fiber by: ? Eating a variety of fruits, vegetables, grains, nuts, and beans. ? Taking a fiber supplement if you are not able to take in enough fiber in your diet. It is better to get fiber through food than from a supplement.  Gradually increase how much fiber you consume. If you increase your intake of dietary fiber too quickly, you may have bloating, cramping, or gas.  Drink plenty of water to help you digest fiber.  Choose high-fiber snacks, such as berries, raw vegetables, nuts, and popcorn. What foods should I eat? Fruits Berries. Pears. Apples. Oranges. Avocado. Prunes and raisins. Dried figs. Vegetables Sweet potatoes. Spinach. Kale. Artichokes. Cabbage. Broccoli. Cauliflower. Green peas. Carrots. Squash. Grains Whole-grain breads. Multigrain cereal. Oats and oatmeal. Brown rice. Barley. Bulgur wheat. [place]. Quinoa. Bran muffins. Popcorn. Rye wafer crackers. Meats and other proteins Navy beans, kidney beans, and pinto beans. Soybeans. Split peas. Lentils. Nuts and seeds. Dairy Fiber-fortified yogurt. Beverages Fiber-fortified soy milk. Fiber-fortified orange juice. Other foods Fiber bars. The items listed above may not be a complete list of recommended foods and beverages. Contact a dietitian for more information. What foods should I avoid? Fruits Fruit juice. Cooked, strained fruit. Vegetables Fried potatoes. Canned vegetables. Well-cooked vegetables. Grains White bread. Pasta made with refined flour. White rice. Meats and other proteins Fatty cuts of meat. Fried chicken or fried fish. Dairy Milk. Yogurt. Cream cheese. Sour cream. Fats and oils Butters. Beverages Soft drinks. Other foods Cakes and pastries. The items listed above may not be a  complete list of foods and beverages to avoid. Talk with your dietitian about what choices are best for you. Summary  Fiber is a type of carbohydrate. It is found in foods such as fruits, vegetables, whole grains, and beans.  A high-fiber diet has many benefits. It can help to prevent constipation, lower blood cholesterol, aid weight loss, and reduce your risk of heart disease, diabetes, and certain cancers.  Increase your intake of fiber gradually. Increasing fiber too quickly may cause cramping, bloating, and gas. Drink plenty of water while you increase the amount of fiber you consume.  The best sources of fiber include whole fruits and vegetables, whole grains, nuts, seeds, and beans. This information is not intended to replace advice given to you by your health care provider. Make sure you discuss any questions you have with your health care provider. Document Revised: 07/18/2019 Document Reviewed: 07/18/2019 Elsevier Patient Education  2021 [employer].

## 2020-07-20 NOTE — Telephone Encounter (Signed)
Januvia 25mg    [id number]  PA Case ID: [id number]   Sent to pan

## 2020-07-20 NOTE — Anesthesia Procedure Notes (Signed)
Date/Time: 07/20/2020 8:20 AM Performed by: Orlie Dakin, CRNA Pre-anesthesia Checklist: Emergency Drugs available, Patient identified, Suction available and Patient being monitored Patient Re-evaluated:Patient Re-evaluated prior to induction Oxygen Delivery Method: Non-rebreather mask Induction Type: IV induction Placement Confirmation: positive ETCO2

## 2020-07-21 ENCOUNTER — Encounter: Payer: Self-pay | Admitting: Internal Medicine

## 2020-07-21 LAB — SURGICAL PATHOLOGY

## 2020-07-21 NOTE — Telephone Encounter (Signed)
Patient aware and verbalizes understanding. 

## 2020-07-22 ENCOUNTER — Telehealth: Payer: Self-pay | Admitting: Licensed Clinical Social Worker

## 2020-07-22 ENCOUNTER — Encounter (HOSPITAL_COMMUNITY): Payer: Self-pay | Admitting: Internal Medicine

## 2020-07-22 ENCOUNTER — Telehealth: Payer: Medicaid Other

## 2020-07-22 NOTE — Telephone Encounter (Signed)
LCSW reached out to [place] Gully w/ [facility] in regards to weatherization application submitted. Message left, await return call.   [name], MSW, [place]  [phone]

## 2020-07-23 DIAGNOSIS — Z79899 Other long term (current) drug therapy: Secondary | ICD-10-CM | POA: Diagnosis not present

## 2020-07-23 DIAGNOSIS — F32A Depression, unspecified: Secondary | ICD-10-CM | POA: Diagnosis not present

## 2020-07-23 DIAGNOSIS — M539 Dorsopathy, unspecified: Secondary | ICD-10-CM | POA: Diagnosis not present

## 2020-07-23 DIAGNOSIS — E119 Type 2 diabetes mellitus without complications: Secondary | ICD-10-CM | POA: Diagnosis not present

## 2020-07-24 ENCOUNTER — Telehealth: Payer: Self-pay | Admitting: Family Medicine

## 2020-07-24 NOTE — Telephone Encounter (Signed)
Patient states that when he went to the pharamcy they gave him both [place] and metformin- pharmacy states that Donnetta Hail is covered.  Per PA on 4/25 it was denied.   Spoke with joyce and she would like for patient to take the [place] and not the metformin since pharmacy gave him both.   Patient verbalizes understanding.

## 2020-07-24 NOTE — Telephone Encounter (Signed)
Pt stated that some of his medication were changed and is wanting to be sure of which ones he is supposed to continue taking.

## 2020-07-27 ENCOUNTER — Telehealth: Payer: Self-pay | Admitting: Licensed Clinical Social Worker

## 2020-07-27 NOTE — Telephone Encounter (Signed)
LCSW reached out to pt following updates from [facility] regarding weatherization application. Pt unfortunately does not have the home in his name/has not filed for lifetime rights. Pt understands this, shares he is hesitant to pursue this b/c he isnt sure how it may affect his disability benefits. LCSW offered to reach out to [facility] who is a community partner who assists pts with filing for disability. Pt agreeable, I have emailed Arrie Senate, [employer] disability specialist. I will f/u with him once I hear back any updates.  [name], MSW, [place]  [phone]

## 2020-07-30 ENCOUNTER — Telehealth: Payer: Self-pay | Admitting: Licensed Clinical Social Worker

## 2020-07-30 NOTE — Telephone Encounter (Signed)
LCSW left pt a message regarding updates about lifetime rights inquiry following his denial for weatherization through [facility]. Pt had concerns about if this would effect his disability benefits. I left him a voicemail with the information as received from [place] is based on work history not assets and resources so it should not affect his benefit. If he had SSI, it could though Social Security allows SSI beneficiaries to have one home and one car (when single)."   I did encourage pt to reach out directly to his caseworker with [employer]ability [employer] to ensure they were aware/to seek further guidance.   [name], MSW, [place]  [phone]

## 2020-08-24 DIAGNOSIS — Z79899 Other long term (current) drug therapy: Secondary | ICD-10-CM | POA: Diagnosis not present

## 2020-08-24 DIAGNOSIS — M539 Dorsopathy, unspecified: Secondary | ICD-10-CM | POA: Diagnosis not present

## 2020-08-24 DIAGNOSIS — E559 Vitamin D deficiency, unspecified: Secondary | ICD-10-CM | POA: Diagnosis not present

## 2020-08-24 DIAGNOSIS — E119 Type 2 diabetes mellitus without complications: Secondary | ICD-10-CM | POA: Diagnosis not present

## 2020-08-24 DIAGNOSIS — F32A Depression, unspecified: Secondary | ICD-10-CM | POA: Diagnosis not present

## 2020-08-28 ENCOUNTER — Telehealth: Payer: Medicaid Other

## 2020-09-22 DIAGNOSIS — E119 Type 2 diabetes mellitus without complications: Secondary | ICD-10-CM | POA: Diagnosis not present

## 2020-09-22 DIAGNOSIS — F32A Depression, unspecified: Secondary | ICD-10-CM | POA: Diagnosis not present

## 2020-09-22 DIAGNOSIS — Z79899 Other long term (current) drug therapy: Secondary | ICD-10-CM | POA: Diagnosis not present

## 2020-09-22 DIAGNOSIS — E559 Vitamin D deficiency, unspecified: Secondary | ICD-10-CM | POA: Diagnosis not present

## 2020-09-22 DIAGNOSIS — M539 Dorsopathy, unspecified: Secondary | ICD-10-CM | POA: Diagnosis not present

## 2020-09-24 ENCOUNTER — Other Ambulatory Visit: Payer: Self-pay | Admitting: Family Medicine

## 2020-09-24 DIAGNOSIS — E1165 Type 2 diabetes mellitus with hyperglycemia: Secondary | ICD-10-CM

## 2020-09-24 DIAGNOSIS — I252 Old myocardial infarction: Secondary | ICD-10-CM

## 2020-10-13 NOTE — Progress Notes (Signed)
Cardiology Clinic Note   Patient Name: Bradley Torres Date of Encounter: 10/14/2020  Primary Care Provider:  [name], [name] Primary Cardiologist:  Skeet Latch, MD  Patient Profile    Bradley Torres. [place] 60 year old male presents the clinic today for follow-up evaluation of his hypertension.  Past Medical History    Past Medical History:  Diagnosis Date   CAD (coronary artery disease)    a. 12/2017: cath showing 10% Proximal-LAD stenosis with no significant obstructive disease and LVEDP mildly elevated at 18 mm Hg.    Chronic back pain    Diabetes mellitus, type 2 (HCC)    DVT (deep venous thrombosis) (HCC)    Essential hypertension    MI (myocardial infarction) ([place]) 2019   Morbid obesity ([place])    Panic disorder    Shoulder pain, left    Following fall   Sleep apnea    Stroke ([place]) 01/2018   Vitamin D deficiency    Past Surgical History:  Procedure Laterality Date   Ankle fracture sugery Left    BIOPSY  07/20/2020   Procedure: BIOPSY;  Surgeon: Daneil Dolin, MD;  [facility]: [facility];  Service: Endoscopy;;  cecal polyp   COLONOSCOPY WITH PROPOFOL N/A 05/29/2017   Dr. [name]: Multiple tubular adenomas removed, prep inadequate.  Short interval surveillance colonoscopy recommended in 6 months   COLONOSCOPY WITH PROPOFOL N/A 07/20/2020   Procedure: COLONOSCOPY WITH PROPOFOL;  Surgeon: Daneil Dolin, MD;  [facility]: [facility];  Service: Endoscopy;  Laterality: N/A;  am appt, early as possible since diabetic   LEFT HEART CATH AND CORONARY ANGIOGRAPHY N/A 01/23/2018   Procedure: LEFT HEART CATH AND CORONARY ANGIOGRAPHY;  Surgeon: Troy Sine, MD;  Location: [facility];  Service: Cardiovascular;  Laterality: N/A;   LUMBAR SPINE SURGERY     fusion   POLYPECTOMY  05/29/2017   Procedure: POLYPECTOMY;  Surgeon: Daneil Dolin, MD;  Location: [facility];  Service: Endoscopy;;  ascending colon polyp x5-cs transverse colon polyp x4- cs descending colon  polyp x1- hs sigmoid colon polyp x1 -hs rectal polyp x1- hs   TONSILLECTOMY      Allergies  Allergies  Allergen Reactions   Lisinopril Other (See Comments)    wheezing   Bystolic [Nebivolol Hcl] Swelling   Ibuprofen Other (See Comments)    Rectal bleed   Spironolactone Hives   Amlodipine Itching and Other (See Comments)    Leg pain.   Diflucan [Fluconazole] Rash   Hctz [Hydrochlorothiazide] Other (See Comments)    Caused gout flares.   Losartan Rash    History of Present Illness    Mr. Bradley Torres is a PMH of atrial fibrillation, HTN, HLD, diabetes, pulmonary hypertension, PE, OSA, minimal coronary calcification, morbid obesity, and anxiety.  He was initially seen in the hypertension clinic 10/21.  He was first diagnosed with hypertension in his late 44s and has continued to struggle with good control.  He was previously taking metoprolol for palpitations.  He was seen by his PCP 9/21 was prescribed hydralazine however he was unwilling to take it more than once per day.  He followed up with Katina Dung, NP and his blood pressure was 164/92.  It was recommended that he increase his metoprolol to 200 mg but he was reluctant.  His heart rate at that time was 95.  He was referred to the hypertension clinic.  He reported fatigue and his metoprolol was switched to nebivolol.   He reported  previous heart attacks although, cardiac catheterization 2019 showed 10% LAD disease.  He has been previously diagnosed with OSA but was in the process of obtaining a new mask.  He was unable to cook for himself because he could not stand for extended periods of time.  He reported taking his brothers restaurant food which was high in sodium.  He reported him most of his meals being fried and he drinks 2000-calorie sugary drinks with caffeine.  His blood pressure remained poorly controlled.  Doxazosin was added to his medication regimen.  He reported that both his blood pressure and his blood sugars were better  controlled when he was able to use marijuana.  He reported started smoking at age 41.  He has helped his anxiety and anger issues.  He is no longer able to use this due to his pain management contract.   He was last seen by Dr. [name] on 02/17/2020.  During that time he was feeling okay.  He noticed light headaches off and on.  He was unable to report how long this was going on.  He struggled with sleep and anxiety.  He reported being anxious in the winter.  He remains sedentary.  He was staying at home and not getting out much.  He did note some pain with his right leg.  He questioned whether he had a blood clot.  He had no edema with his pain and it had improved.  He had no calf pain and his pain was mostly anterior.  His blood pressure at home was 186/103.  He reported checking it regularly.  He will check his blood pressure in his forearm and was not sure if it was accurate.  He struggled with his anxiety.  He denied orthopnea and PND.  He reported increased weight but was not sure why.  He reported his ability to read was limited.   He was contacted by clinical pharmacist 06/24/2020.  She was checking to make sure that he was taking his medications as prescribed and did not have any medication concerns.  He reported that he was feeling more short of breath and noted increased fatigue.   He presented to the clinic 07/02/20 for follow-up evaluation stated he had been limiting his physical activity due to his chronic back pain and left leg pain.  He reported that he continues to have fatigue and felt it was related to his blood pressure.  We reviewed safe blood pressure and the benefits of having good blood pressure control.  He expressed understanding.  He reports that he liked to be outside and active in the spring/summer time.  I encouraged this.  We discussed low-sodium diet.  He reported that he continues to go to his brother's restaurant and eat meals there.  He reported he added salt to his meals.  I  encouraged him to review our salty 6 diet sheet and refrain from adding extra salt to his food.  He also reported that he enjoyed drinking soda and sweet tea.  I have encouraged him to transition to mainly drinking water due to calories.  His blood pressure monitor was checked next to our clinic cuff today.  His blood pressure cuff read about 10 mmHg higher systolic.  He brought in his log from home and his blood pressures were ranging in the 130s over 80s.  I continued  his current medication regimen.  We  gave him salty 6 diet sheet, asked him to continue to increase his physical activity,  and planned follow-up in 3 months.  He presents to the clinic today for follow-up evaluation states he feels that his energy is improving.  He reports that he takes his valsartan half tablet in the morning and half tablet in the evening.  He also splits his chlorthalidone because he reports that if he takes his blood pressure medications all at 1 time he becomes dizzy.  He has been eating that his brothers caf less and trying to eat a low-sodium diet.  He maintains his own house and does yard work.  He has lost around 9 pounds.  We reviewed the importance of continued weight loss, heart healthy diet, and exercise.  I will continue his current medication regimen and have him follow-up in 6 months.   Today he denies chest pain,  lower extremity edema, increased fatigue, palpitations, melena, hematuria, hemoptysis, diaphoresis, weakness, presyncope, syncope, orthopnea, and PND.   Previous antihypertensives: Thiazide- gout CCB ARB ACE-I- cough, wheezing Spironolactone- rash  Home Medications    Prior to Admission medications   Medication Sig Start Date End Date Taking? Authorizing Provider  atorvastatin (LIPITOR) 10 MG tablet TAKE ONE TABLET BY MOUTH DAILY 09/25/20   [name], [name]  chlorthalidone (HYGROTON) 25 MG tablet Take 1 tablet (25 mg total) by mouth daily. 05/21/20 08/19/20  Skeet Latch, MD   dapagliflozin propanediol (FARXIGA) 10 MG TABS tablet Take 1 tablet (10 mg total) by mouth daily before breakfast. 07/15/20   [name], [name]  gabapentin (NEURONTIN) 300 MG capsule Take 1 capsule by mouth daily as needed (pain). 04/22/20   [provider]  glucose blood (ONETOUCH VERIO) test strip USE TO TEST BLOOD SUGAR DAILY AS DIRECTED. DX: 11.9 01/07/20   Hendricks Limes F, [name]  hydrALAZINE (APRESOLINE) 50 MG tablet Take 50 mg by mouth daily at 2 PM.    [provider]  JANUVIA 25 MG tablet TAKE ONE TABLET BY MOUTH ONCE DAILY 09/25/20   [name], [name]  metFORMIN (GLUCOPHAGE-XR) 500 MG 24 hr tablet Take 500 mg by mouth with breakfast x1 week, then increase to 500 mg twice daily with meals. 07/20/20   [name], [name]  OneTouch Delica Lancets 89Q MISC USE TO TEST BLOOD SUGAR DAILY AS DIRECTED. DX: 11.9 01/07/20   Hendricks Limes F, [name]  oxyCODONE-acetaminophen (PERCOCET) 10-325 MG tablet Take 1 tablet by mouth 3 (three) times daily as needed for pain.    [provider]  valsartan (DIOVAN) 320 MG tablet Take 1 tablet (320 mg total) by mouth daily. Patient taking differently: Take 160 mg by mouth daily. 04/29/20   Skeet Latch, MD  XARELTO 20 MG TABS tablet TAKE ONE TABLET BY MOUTH DAILY WITH SUPPER Patient taking differently: Take 20 mg by mouth daily with supper. 06/22/20   [name], [name]    Family History    Family History  Problem Relation Age of Onset   Colon cancer Mother        died at age 90   Heart attack Father    Hepatitis Sister 34       Autoimmune   Heart attack Paternal Grandfather    He indicated that his mother is deceased. He indicated that his father is deceased. He indicated that his sister is deceased. He indicated that his brother is alive. He indicated that the status of his paternal grandfather is unknown.  Social History    Social History   Socioeconomic History   Marital status: Single  Spouse name: Not on  file   Number of children: Not on file   Years of education: Not on file   Highest education level: Not on file  Occupational History   Not on file  Tobacco Use   Smoking status: Never   Smokeless tobacco: Never  Vaping Use   Vaping Use: Never used  Substance and Sexual Activity   Alcohol use: Yes    Comment: occ   Drug use: Not Currently    Types: Marijuana    Comment: daily; 05/19/20 no marijuana in past 6 months d/t going to pain management clinic   Sexual activity: Not Currently    Birth control/protection: None  Other Topics Concern   Not on file  Social History Narrative   Not on file   Social Determinants of Health   Financial Resource Strain: Medium Risk   Difficulty of Paying Living Expenses: Somewhat hard  Food Insecurity: No Food Insecurity   Worried About Charity fundraiser in the Last Year: Never true   [place] in the Last Year: Never true  Transportation Needs: No Transportation Needs   Lack of Transportation (Medical): No   Lack of Transportation (Non-Medical): No  Physical Activity: Not on file  Stress: Not on file  Social Connections: Unknown   Frequency of Communication with Friends and Family: More than three times a week   Frequency of Social Gatherings with Friends and Family: More than three times a week   Attends Religious Services: Not on file   Active Member of Clubs or Organizations: Not on file   Attends Archivist Meetings: Not on file   Marital Status: Not on file  Intimate Partner Violence: Not on file     Review of Systems    General:  No chills, fever, night sweats or weight changes.  Cardiovascular:  No chest pain, dyspnea on exertion, edema, orthopnea, palpitations, paroxysmal nocturnal dyspnea. Dermatological: No rash, lesions/masses Respiratory: No cough, dyspnea Urologic: No hematuria, dysuria Abdominal:   No nausea, vomiting, diarrhea, bright red blood per rectum, melena, or hematemesis Neurologic:  No  visual changes, wkns, changes in mental status. All other systems reviewed and are otherwise negative except as noted above.  Physical Exam    VS:  BP 120/80 (BP Location: Left Arm)   Pulse 80   Ht 6' (1.829 m)   Wt (!) 403 lb 9.6 oz (183.1 kg)   SpO2 97%   BMI 54.74 kg/m  , BMI Body mass index is 54.74 kg/m. GEN: Well nourished, well developed, in no acute distress. HEENT: normal. Neck: Supple, no JVD, carotid bruits, or masses. Cardiac: RRR, no murmurs, rubs, or gallops. No clubbing, cyanosis, edema.  Radials/DP/PT 2+ and equal bilaterally.  Respiratory:  Respirations regular and unlabored, clear to auscultation bilaterally. GI: Soft, nontender, nondistended, BS + x 4. MS: no deformity or atrophy. Skin: warm and dry, no rash. Neuro:  Strength and sensation are intact. Psych: Normal affect.  Accessory Clinical Findings    Recent Labs: 07/15/2020: ALT 22; BUN 21; Creatinine, Ser 1.40; Hemoglobin 14.5; Platelets 303; Potassium 4.4; Sodium 141   Recent Lipid Panel    Component Value Date/Time   CHOL 192 07/15/2020 1122   TRIG 136 07/15/2020 1122   HDL 39 (L) 07/15/2020 1122   CHOLHDL 4.9 07/15/2020 1122   CHOLHDL 5.1 01/20/2018 0209   VLDL 26 01/20/2018 0209   LDLCALC 128 (H) 07/15/2020 1122    ECG personally reviewed by me today-none today.  Echocardiogram 05/21/2019 IMPRESSIONS     1. Left ventricular ejection fraction, by estimation, is 60 to 65%. The  left ventricle has normal function. The left ventricle has no regional  wall motion abnormalities. There is moderate concentric left ventricular  hypertrophy. Left ventricular  diastolic parameters were normal.   2. Right ventricular systolic function is normal. The right ventricular  size is normal.   3. The mitral valve is grossly normal. Trivial mitral valve  regurgitation.   4. The aortic valve is tricuspid. Aortic valve regurgitation is not  visualized. No aortic stenosis is present.   5. The inferior vena  cava is normal in size with greater than 50%  respiratory variability, suggesting right atrial pressure of 3 mmHg.  Assessment & Plan   1.  Essential hypertension-BP today 120/80.  Well-controlled at home.  Intolerant to multiple medications.  Fatigue has improved with changing from metoprolol to nebivolol and in general with continued medication compliance.  He continues his low-sodium diet.  Reports he is unable to tolerate CPAP. Continue chlorthalidone, hydralazine, valsartan Healthy low-sodium diet.   Increase physical activity as tolerated   Fatigue -improved.  Denies recent episodes of dizziness presyncope or syncope.  Feels it was related to medication change and poor hydration.  Continues to do indoor housework maintains yard.    Weight today 403.6 pounds Maintain p.o. hydration Increase physical activity as tolerated Continue weight loss   Chronic diastolic CHF/shortness of breath -denies labored breathing and dyspnea.   No increased activity intolerance.  Echocardiogram 01/23/2018 showed EF 60-65%, details above. Heart healthy low-sodium diet Increase physical activity as tolerated   Follow-up with Dr. [name] or me in 6 month.   Jossie Ng. Izaac Reisig NP-C    10/14/2020, 3:16 PM [facility] [place] [address] Office [phone] Fax [phone]  Notice: This dictation was prepared with Dragon dictation along with smaller phrase technology. Any transcriptional errors that result from this process are unintentional and may not be corrected upon review.  I spent 13 minutes examining this patient, reviewing medications, and using patient centered shared decision making involving her cardiac care.  Prior to her visit I spent greater than 20 minutes reviewing her past medical history,  medications, and prior cardiac tests.

## 2020-10-14 ENCOUNTER — Other Ambulatory Visit: Payer: Self-pay

## 2020-10-14 ENCOUNTER — Encounter: Payer: Self-pay | Admitting: General Practice

## 2020-10-14 ENCOUNTER — Ambulatory Visit (INDEPENDENT_AMBULATORY_CARE_PROVIDER_SITE_OTHER): Payer: Medicaid Other | Admitting: General Practice

## 2020-10-14 VITALS — BP 120/80 | HR 80 | Ht 72.0 in | Wt >= 6400 oz

## 2020-10-14 DIAGNOSIS — I5032 Chronic diastolic (congestive) heart failure: Secondary | ICD-10-CM | POA: Diagnosis not present

## 2020-10-14 DIAGNOSIS — R5383 Other fatigue: Secondary | ICD-10-CM

## 2020-10-14 DIAGNOSIS — I10 Essential (primary) hypertension: Secondary | ICD-10-CM | POA: Diagnosis not present

## 2020-10-14 DIAGNOSIS — R0602 Shortness of breath: Secondary | ICD-10-CM | POA: Diagnosis not present

## 2020-10-14 NOTE — Patient Instructions (Signed)
Medication Instructions:  The current medical regimen is effective;  continue present plan and medications as directed. Please refer to the Current Medication list given to you today.  *If you need a refill on your cardiac medications before your next appointment, please call your pharmacy*  Lab Work:   Testing/Procedures:  NONE    NONE  Special Instructions  PLEASE READ AND FOLLOW SALTY 6-ATTACHED-1,800mg  daily  PLEASE INCREASE PHYSICAL ACTIVITY AS TOLERATED-GOAL IS 150 MINUTES/WEEKLY DO NOT INCREASE MORE THAN 10% WEEKLY  Follow-Up: Your next appointment:  6 month(s) In Person with Skeet Latch, MD-[place] [place] at [facility] Phone: [phone]; Address: [address] [place], [place] [address]   Please call our office 2 months in advance to schedule this appointment   At [facility], you and your health needs are our priority.  As part of our continuing mission to provide you with exceptional heart care, we have created designated Provider Care Teams.  These Care Teams include your primary Cardiologist (physician) and Advanced Practice Providers (APPs -  Physician Assistants and Nurse Practitioners) who all work together to provide you with the care you need, when you need it.             6 SALTY THINGS TO AVOID     1,800MG  DAILY

## 2020-10-15 ENCOUNTER — Telehealth: Payer: Medicaid Other

## 2020-10-21 ENCOUNTER — Ambulatory Visit: Payer: Medicaid Other | Admitting: Family Medicine

## 2020-10-21 ENCOUNTER — Other Ambulatory Visit: Payer: Self-pay

## 2020-10-21 ENCOUNTER — Encounter: Payer: Self-pay | Admitting: Family Medicine

## 2020-10-21 VITALS — BP 119/78 | HR 73 | Temp 97.4°F | Resp 22 | Ht 72.0 in | Wt >= 6400 oz

## 2020-10-21 DIAGNOSIS — N1831 Chronic kidney disease, stage 3a: Secondary | ICD-10-CM

## 2020-10-21 DIAGNOSIS — I5032 Chronic diastolic (congestive) heart failure: Secondary | ICD-10-CM

## 2020-10-21 DIAGNOSIS — I252 Old myocardial infarction: Secondary | ICD-10-CM | POA: Diagnosis not present

## 2020-10-21 DIAGNOSIS — I480 Paroxysmal atrial fibrillation: Secondary | ICD-10-CM | POA: Diagnosis not present

## 2020-10-21 DIAGNOSIS — M2560 Stiffness of unspecified joint, not elsewhere classified: Secondary | ICD-10-CM | POA: Diagnosis not present

## 2020-10-21 DIAGNOSIS — Z86711 Personal history of pulmonary embolism: Secondary | ICD-10-CM

## 2020-10-21 DIAGNOSIS — E1165 Type 2 diabetes mellitus with hyperglycemia: Secondary | ICD-10-CM

## 2020-10-21 DIAGNOSIS — G4733 Obstructive sleep apnea (adult) (pediatric): Secondary | ICD-10-CM

## 2020-10-21 DIAGNOSIS — I10 Essential (primary) hypertension: Secondary | ICD-10-CM

## 2020-10-21 DIAGNOSIS — M8949 Other hypertrophic osteoarthropathy, multiple sites: Secondary | ICD-10-CM | POA: Diagnosis not present

## 2020-10-21 DIAGNOSIS — I152 Hypertension secondary to endocrine disorders: Secondary | ICD-10-CM

## 2020-10-21 DIAGNOSIS — E1159 Type 2 diabetes mellitus with other circulatory complications: Secondary | ICD-10-CM

## 2020-10-21 DIAGNOSIS — M67912 Unspecified disorder of synovium and tendon, left shoulder: Secondary | ICD-10-CM

## 2020-10-21 DIAGNOSIS — M159 Polyosteoarthritis, unspecified: Secondary | ICD-10-CM

## 2020-10-21 LAB — BAYER DCA HB A1C WAIVED: HB A1C (BAYER DCA - WAIVED): 8.2 % — ABNORMAL HIGH (ref <7.0)

## 2020-10-21 MED ORDER — SITAGLIPTIN PHOSPHATE 50 MG PO TABS: 50.0000 mg | ORAL_TABLET | Freq: Every day | ORAL | 1 refills | Status: DC

## 2020-10-21 NOTE — Progress Notes (Signed)
Assessment & Plan:  1. Type 2 diabetes mellitus with hyperglycemia, without long-term current use of insulin (HCC) Lab Results  Component Value Date   HGBA1C 8.2 (H) 10/21/2020   HGBA1C 8.0 (H) 07/15/2020   HGBA1C 7.7 (H) 12/25/2019    - Diabetes is not at goal of A1c < 7. - Medications:  Continue Farxiga 10 mg daily. Increase Januvia from 25 mg to 50 mg once daily.  - Home glucose monitoring: Encouraged to monitor. Discussed CGMs - patient is going to think about it. - Patient is currently taking a statin. Patient is taking an ACE-inhibitor/ARB.  - Instruction/counseling given: reminded to get eye exam and other instruction/counseling: continuous glucose monitoring  Diabetes Health Maintenance Due  Topic Date Due   OPHTHALMOLOGY EXAM  Never done   HEMOGLOBIN A1C  04/23/2021   FOOT EXAM  07/15/2021    - CMP14+EGFR - Bayer DCA Hb A1c Waived - sitaGLIPtin (JANUVIA) 50 MG tablet; Take 1 tablet (50 mg total) by mouth daily.  Dispense: 90 tablet; Refill: 1 - atorvastatin (LIPITOR) 10 MG tablet; Take 1 tablet (10 mg total) by mouth daily.  Dispense: 90 tablet; Refill: 1 - dapagliflozin propanediol (FARXIGA) 10 MG TABS tablet; Take 1 tablet (10 mg total) by mouth daily before breakfast.  Dispense: 90 tablet; Refill: 1  2. Hypertension associated with diabetes ([place]) Well controlled on current regimen.  - CMP14+EGFR  3. Paroxysmal atrial fibrillation (HCC) On Xarelto.  4. Chronic diastolic CHF (congestive heart failure) ([place]) Managed by cardiology. - CMP14+EGFR  5. Stage 3a chronic kidney disease ([place]) On Farxiga. No NSAIDs. Labs to assess. - CMP14+EGFR - dapagliflozin propanediol (FARXIGA) 10 MG TABS tablet; Take 1 tablet (10 mg total) by mouth daily before breakfast.  Dispense: 90 tablet; Refill: 1  6. Hx of non-ST elevation myocardial infarction (NSTEMI) On a statin. - atorvastatin (LIPITOR) 10 MG tablet; Take 1 tablet (10 mg total) by mouth daily.  Dispense: 90 tablet;  Refill: 1  7. Hx pulmonary embolism On Xarelto. - rivaroxaban (XARELTO) 20 MG TABS tablet; Take 1 tablet (20 mg total) by mouth daily with supper.  Dispense: 90 tablet; Refill: 1  8. Multiple stiff joints Encouraged use of Tylenol. - Ambulatory referral to Physical Therapy  9. Disorder of left rotator cuff - Ambulatory referral to Physical Therapy  10. Primary osteoarthritis involving multiple joints Encouraged use of Tylenol.  11. Severe obstructive sleep apnea-hypopnea syndrome Noncompliant with CPAP.  12. Morbid (severe) obesity due to excess calories ([place]) Continue healthy eating. Exercise encouraged, but patient is having a hard time due to stiff joints.    Return in about 3 months (around 01/21/2021) for annual physical.  Hendricks Limes, MSN, APRN, [name]-C [name]  Subjective:    Patient ID: Bradley Torres, male    DOB: Aug 29, 1960, 60 y.o.   MRN: [id number]  Patient Care Team: [name], [name] as PCP - General (Family Medicine) Skeet Latch, MD as PCP - Cardiology (Cardiology)   Chief Complaint:  Chief Complaint  Patient presents with   Medical Management of Chronic Issues    3 mo     HPI: Bradley Torres is a 60 y.o. male presenting on 10/21/2020 for Medical Management of Chronic Issues (3 mo )  Hypertension: managed by the hypertension clinic.  Diabetes: Patient presents for follow up of diabetes. Current symptoms include: hyperglycemia. Known diabetic complications: cardiovascular disease and cerebrovascular disease. Medication compliance: yes. Current diet: in general, a "healthy" diet  . Current  exercise: none. Home blood sugar records: patient does not check sugars. Is he  on ACE inhibitor or angiotensin II receptor blocker? Yes. Is he on a statin? Yes.   Sleep apnea: noncompliant with CPAP so he no longer has one.  New complaints: Patient reports his hips get stiff when he is up walking, like they don't want to keep  going. This has been going on for a few months now. He just has to slow down to keep walking. In the same time frame, he shoulder feel like he "is toting an elephant" when he is walking. He also feels like his head is heavy and that gravity is trying to drag him down. The shoulders feel stiff as well.    Social history:  Relevant past medical, surgical, family and social history reviewed and updated as indicated. Interim medical history since our last visit reviewed.  Allergies and medications reviewed and updated.  DATA REVIEWED: CHART IN EPIC  ROS: Negative unless specifically indicated above in HPI.    Current Outpatient Medications:    atorvastatin (LIPITOR) 10 MG tablet, TAKE ONE TABLET BY MOUTH DAILY, Disp: 30 tablet, Rfl: 2   chlorthalidone (HYGROTON) 25 MG tablet, Take 1 tablet (25 mg total) by mouth daily., Disp: 30 tablet, Rfl: 6   dapagliflozin propanediol (FARXIGA) 10 MG TABS tablet, Take 1 tablet (10 mg total) by mouth daily before breakfast., Disp: 90 tablet, Rfl: 1   gabapentin (NEURONTIN) 300 MG capsule, Take 1 capsule by mouth daily as needed (pain)., Disp: , Rfl:    glucose blood (ONETOUCH VERIO) test strip, USE TO TEST BLOOD SUGAR DAILY AS DIRECTED. DX: 11.9, Disp: 100 each, Rfl: 12   hydrALAZINE (APRESOLINE) 50 MG tablet, Take 50 mg by mouth daily at 2 PM., Disp: , Rfl:    JANUVIA 25 MG tablet, TAKE ONE TABLET BY MOUTH ONCE DAILY, Disp: 30 tablet, Rfl: 0   OneTouch Delica Lancets 37T MISC, USE TO TEST BLOOD SUGAR DAILY AS DIRECTED. DX: 11.9, Disp: 100 each, Rfl: 11   oxyCODONE-acetaminophen (PERCOCET) 10-325 MG tablet, Take 1 tablet by mouth 3 (three) times daily as needed for pain., Disp: , Rfl:    valsartan (DIOVAN) 320 MG tablet, Take 1 tablet (320 mg total) by mouth daily. (Patient taking differently: Take 160 mg by mouth daily.), Disp: 30 tablet, Rfl: 6   XARELTO 20 MG TABS tablet, TAKE ONE TABLET BY MOUTH DAILY WITH SUPPER (Patient taking differently: Take 20 mg by  mouth daily with supper.), Disp: 30 tablet, Rfl: 6   Allergies  Allergen Reactions   Lisinopril Other (See Comments)    wheezing   Bystolic [Nebivolol Hcl] Swelling   Ibuprofen Other (See Comments)    Rectal bleed   Spironolactone Hives   Amlodipine Itching and Other (See Comments)    Leg pain.   Diflucan [Fluconazole] Rash   Hctz [Hydrochlorothiazide] Other (See Comments)    Caused gout flares.   Losartan Rash   Past Medical History:  Diagnosis Date   CAD (coronary artery disease)    a. 12/2017: cath showing 10% Proximal-LAD stenosis with no significant obstructive disease and LVEDP mildly elevated at 18 mm Hg.    Chronic back pain    Diabetes mellitus, type 2 (HCC)    DVT (deep venous thrombosis) (HCC)    Essential hypertension    MI (myocardial infarction) ([place]) 2019   Morbid obesity ([place])    Panic disorder    Shoulder pain, left    Following fall  Sleep apnea    Stroke ([place]) 01/2018   Vitamin D deficiency     Past Surgical History:  Procedure Laterality Date   Ankle fracture sugery Left    BIOPSY  07/20/2020   Procedure: BIOPSY;  Surgeon: Daneil Dolin, MD;  [facility];  Service: Endoscopy;;  cecal polyp   COLONOSCOPY WITH PROPOFOL N/A 05/29/2017   Dr. [name]: Multiple tubular adenomas removed, prep inadequate.  Short interval surveillance colonoscopy recommended in 6 months   COLONOSCOPY WITH PROPOFOL N/A 07/20/2020   Procedure: COLONOSCOPY WITH PROPOFOL;  Surgeon: Daneil Dolin, MD;  [facility];  Service: Endoscopy;  Laterality: N/A;  am appt, early as possible since diabetic   LEFT HEART CATH AND CORONARY ANGIOGRAPHY N/A 01/23/2018   Procedure: LEFT HEART CATH AND CORONARY ANGIOGRAPHY;  Surgeon: Troy Sine, MD;  Location: [facility];  Service: Cardiovascular;  Laterality: N/A;   LUMBAR SPINE SURGERY     fusion   POLYPECTOMY  05/29/2017   Procedure: POLYPECTOMY;  Surgeon: Daneil Dolin, MD;  Location: [facility];   Service: Endoscopy;;  ascending colon polyp x5-cs transverse colon polyp x4- cs descending colon polyp x1- hs sigmoid colon polyp x1 -hs rectal polyp x1- hs   TONSILLECTOMY      Social History   Socioeconomic History   Marital status: Single    Spouse name: Not on file   Number of children: Not on file   Years of education: Not on file   Highest education level: Not on file  Occupational History   Not on file  Tobacco Use   Smoking status: Never   Smokeless tobacco: Never  Vaping Use   Vaping Use: Never used  Substance and Sexual Activity   Alcohol use: Yes    Comment: occ   Drug use: Not Currently    Types: Marijuana    Comment: daily; 05/19/20 no marijuana in past 6 months d/t going to pain management clinic   Sexual activity: Not Currently    Birth control/protection: None  Other Topics Concern   Not on file  Social History Narrative   Not on file   Social Determinants of Health   Financial Resource Strain: Medium Risk   Difficulty of Paying Living Expenses: Somewhat hard  Food Insecurity: No Food Insecurity   Worried About Charity fundraiser in the Last Year: Never true   Ran Out of Food in the Last Year: Never true  Transportation Needs: No Transportation Needs   Lack of Transportation (Medical): No   Lack of Transportation (Non-Medical): No  Physical Activity: Not on file  Stress: Not on file  Social Connections: Unknown   Frequency of Communication with Friends and Family: More than three times a week   Frequency of Social Gatherings with Friends and Family: More than three times a week   Attends Religious Services: Not on file   Active Member of Clubs or Organizations: Not on file   Attends Archivist Meetings: Not on file   Marital Status: Not on file  Intimate Partner Violence: Not on file        Objective:    BP 119/78   Pulse 73   Temp (!) 97.4 F (36.3 C)   Resp (!) 22   Ht 6' (1.829 m)   Wt (!) 404 lb (183.3 kg)   SpO2 94%    BMI 54.79 kg/m   Wt Readings from Last 3 Encounters:  10/21/20 (!) 404  lb (183.3 kg)  10/14/20 (!) 403 lb 9.6 oz (183.1 kg)  07/16/20 (!) 410 lb (186 kg)    Physical Exam Vitals reviewed.  Constitutional:      General: He is not in acute distress.    Appearance: Normal appearance. He is morbidly obese. He is not ill-appearing, toxic-appearing or diaphoretic.  HENT:     Head: Normocephalic and atraumatic.  Eyes:     General: No scleral icterus.       Right eye: No discharge.        Left eye: No discharge.     Conjunctiva/sclera: Conjunctivae normal.  Cardiovascular:     Rate and Rhythm: Normal rate and regular rhythm.     Heart sounds: Normal heart sounds. No murmur heard.   No friction rub. No gallop.  Pulmonary:     Effort: Pulmonary effort is normal. No respiratory distress.     Breath sounds: Normal breath sounds. No stridor. No wheezing, rhonchi or rales.  Musculoskeletal:     Right shoulder: Crepitus present. Normal range of motion.     Left shoulder: Crepitus present. Decreased range of motion.     Cervical back: Normal range of motion.     Right hip: No tenderness or bony tenderness.     Left hip: No tenderness or bony tenderness.  Skin:    General: Skin is warm and dry.  Neurological:     Mental Status: He is alert and oriented to person, place, and time. Mental status is at baseline.  Psychiatric:        Mood and Affect: Mood normal.        Behavior: Behavior normal.        Thought Content: Thought content normal.        Judgment: Judgment normal.    Lab Results  Component Value Date   TSH 2.840 05/13/2019   Lab Results  Component Value Date   WBC 8.9 07/15/2020   HGB 14.5 07/15/2020   HCT 44.4 07/15/2020   MCV 96 07/15/2020   PLT 303 07/15/2020   Lab Results  Component Value Date   NA 141 07/15/2020   K 4.4 07/15/2020   CO2 28 07/15/2020   GLUCOSE 221 (H) 07/15/2020   BUN 21 07/15/2020   CREATININE 1.40 (H) 07/15/2020   BILITOT 0.3 07/15/2020    ALKPHOS 84 07/15/2020   AST 19 07/15/2020   ALT 22 07/15/2020   PROT 7.0 07/15/2020   ALBUMIN 4.0 07/15/2020   CALCIUM 9.2 07/15/2020   ANIONGAP 10 04/10/2019   EGFR 58 (L) 07/15/2020   Lab Results  Component Value Date   CHOL 192 07/15/2020   Lab Results  Component Value Date   HDL 39 (L) 07/15/2020   Lab Results  Component Value Date   LDLCALC 128 (H) 07/15/2020   Lab Results  Component Value Date   TRIG 136 07/15/2020   Lab Results  Component Value Date   CHOLHDL 4.9 07/15/2020   Lab Results  Component Value Date   HGBA1C 8.0 (H) 07/15/2020

## 2020-10-22 LAB — CMP14+EGFR
ALT: 19 IU/L (ref 0–44)
AST: 18 IU/L (ref 0–40)
Albumin/Globulin Ratio: 1.5 (ref 1.2–2.2)
Albumin: 4.3 g/dL (ref 3.8–4.9)
Alkaline Phosphatase: 98 IU/L (ref 44–121)
BUN/Creatinine Ratio: 15 (ref 9–20)
BUN: 26 mg/dL — ABNORMAL HIGH (ref 6–24)
Bilirubin Total: 0.5 mg/dL (ref 0.0–1.2)
CO2: 26 mmol/L (ref 20–29)
Calcium: 9.3 mg/dL (ref 8.7–10.2)
Chloride: 95 mmol/L — ABNORMAL LOW (ref 96–106)
Creatinine, Ser: 1.73 mg/dL — ABNORMAL HIGH (ref 0.76–1.27)
Globulin, Total: 2.9 g/dL (ref 1.5–4.5)
Glucose: 295 mg/dL — ABNORMAL HIGH (ref 65–99)
Potassium: 4.4 mmol/L (ref 3.5–5.2)
Sodium: 139 mmol/L (ref 134–144)
Total Protein: 7.2 g/dL (ref 6.0–8.5)
eGFR: 45 mL/min/{1.73_m2} — ABNORMAL LOW (ref >59)

## 2020-10-23 DIAGNOSIS — E119 Type 2 diabetes mellitus without complications: Secondary | ICD-10-CM | POA: Diagnosis not present

## 2020-10-23 DIAGNOSIS — M539 Dorsopathy, unspecified: Secondary | ICD-10-CM | POA: Diagnosis not present

## 2020-10-23 DIAGNOSIS — Z79899 Other long term (current) drug therapy: Secondary | ICD-10-CM | POA: Diagnosis not present

## 2020-10-24 ENCOUNTER — Encounter: Payer: Self-pay | Admitting: Family Medicine

## 2020-10-24 DIAGNOSIS — Z86711 Personal history of pulmonary embolism: Secondary | ICD-10-CM | POA: Insufficient documentation

## 2020-10-24 MED ORDER — RIVAROXABAN 20 MG PO TABS: 20.0000 mg | ORAL_TABLET | Freq: Every day | ORAL | 1 refills | Status: DC

## 2020-10-24 MED ORDER — DAPAGLIFLOZIN PROPANEDIOL 10 MG PO TABS: 10.0000 mg | ORAL_TABLET | Freq: Every day | ORAL | 1 refills | Status: DC

## 2020-10-24 MED ORDER — ATORVASTATIN CALCIUM 10 MG PO TABS: 10.0000 mg | ORAL_TABLET | Freq: Every day | ORAL | 1 refills | Status: DC

## 2020-10-27 DIAGNOSIS — Z79899 Other long term (current) drug therapy: Secondary | ICD-10-CM | POA: Diagnosis not present

## 2020-11-13 ENCOUNTER — Other Ambulatory Visit: Payer: Self-pay

## 2020-11-13 ENCOUNTER — Ambulatory Visit (INDEPENDENT_AMBULATORY_CARE_PROVIDER_SITE_OTHER): Payer: Medicaid Other

## 2020-11-13 ENCOUNTER — Ambulatory Visit: Payer: Medicaid Other | Admitting: Nurse Practitioner

## 2020-11-13 ENCOUNTER — Encounter: Payer: Self-pay | Admitting: Nurse Practitioner

## 2020-11-13 VITALS — BP 130/80 | HR 57 | Temp 97.5°F | Resp 20 | Ht 72.0 in | Wt >= 6400 oz

## 2020-11-13 DIAGNOSIS — R1032 Left lower quadrant pain: Secondary | ICD-10-CM

## 2020-11-13 DIAGNOSIS — K5732 Diverticulitis of large intestine without perforation or abscess without bleeding: Secondary | ICD-10-CM

## 2020-11-13 MED ORDER — CIPROFLOXACIN HCL 500 MG PO TABS: 500.0000 mg | ORAL_TABLET | Freq: Two times a day (BID) | ORAL | 0 refills | Status: DC

## 2020-11-13 MED ORDER — METRONIDAZOLE 500 MG PO TABS
500.0000 mg | ORAL_TABLET | Freq: Two times a day (BID) | ORAL | 0 refills | Status: DC
Start: 2020-11-13 — End: 2020-11-27

## 2020-11-13 NOTE — Progress Notes (Signed)
   Subjective:    Patient ID: Bradley Torres, male    DOB: November 22, 1960, 60 y.o.   MRN: [id number]   Chief Complaint: Abdominal Pain (No energy. States that he thinks he has a colon infection/)   HPI Patient comes in today c/o feeling "real bad". He says that he fees like a "boil" has come up in him and it has "Popped". He is having abdominal pain around his midline. He has no energy, with poor appetite. He said this started 2 weeks ago. Saw B. Blanch Media and she wanted him to go to therapy but he refused. He is a diabetic and him and brittany are working on his HGBa1c. Normal bowel movements. He has history of diverticulosis.    Review of Systems  Constitutional: Negative.   Eyes: Negative.   Respiratory: Negative.    Cardiovascular: Negative.   Gastrointestinal:  Positive for abdominal pain. Negative for blood in stool, constipation and diarrhea.  Genitourinary: Negative.   Neurological: Negative.   Psychiatric/Behavioral: Negative.    All other systems reviewed and are negative.     Objective:   Physical Exam Vitals and nursing note reviewed.  Constitutional:      Appearance: He is well-developed. He is obese.  Cardiovascular:     Rate and Rhythm: Regular rhythm.  Abdominal:     General: Bowel sounds are normal.     Tenderness: There is no right CVA tenderness, left CVA tenderness or guarding. Negative signs include Murphy's sign and McBurney's sign.  Neurological:     Mental Status: He is alert.    BP 130/80   Pulse (!) 57   Temp (!) 97.5 F (36.4 C) (Temporal)   Resp 20   Ht 6' (1.829 m)   Wt (!) 400 lb (181.4 kg)   SpO2 94%   BMI 54.25 kg/m        Assessment & Plan:  Bradley Torres in today with chief complaint of Abdominal Pain (No energy. States that he thinks he has a colon infection/)   1. Left lower quadrant abdominal pain - DG Abd 1 View  2. Diverticulitis of large intestine without perforation or abscess without bleeding Watch diet- avoid foods with  small seeds or undigestable sin. Follow up with PCP. - metroNIDAZOLE (FLAGYL) 500 MG tablet; Take 1 tablet (500 mg total) by mouth 2 (two) times daily.  Dispense: 14 tablet; Refill: 0 - ciprofloxacin (CIPRO) 500 MG tablet; Take 1 tablet (500 mg total) by mouth 2 (two) times daily.  Dispense: 14 tablet; Refill: 0    The above assessment and management plan was discussed with the patient. The patient verbalized understanding of and has agreed to the management plan. Patient is aware to call the clinic if symptoms persist or worsen. Patient is aware when to return to the clinic for a follow-up visit. Patient educated on when it is appropriate to go to the emergency department.   Mary-Margaret Hassell Done, [name]

## 2020-11-25 DIAGNOSIS — Z79899 Other long term (current) drug therapy: Secondary | ICD-10-CM | POA: Diagnosis not present

## 2020-11-27 ENCOUNTER — Telehealth: Payer: Self-pay | Admitting: Family Medicine

## 2020-11-27 DIAGNOSIS — Z79899 Other long term (current) drug therapy: Secondary | ICD-10-CM | POA: Diagnosis not present

## 2020-11-27 DIAGNOSIS — K5732 Diverticulitis of large intestine without perforation or abscess without bleeding: Secondary | ICD-10-CM

## 2020-11-27 MED ORDER — METRONIDAZOLE 500 MG PO TABS: 500.0000 mg | ORAL_TABLET | Freq: Two times a day (BID) | ORAL | 0 refills | Status: DC

## 2020-11-27 MED ORDER — CIPROFLOXACIN HCL 500 MG PO TABS: 500.0000 mg | ORAL_TABLET | Freq: Two times a day (BID) | ORAL | 0 refills | Status: DC

## 2020-11-27 NOTE — Telephone Encounter (Signed)
Pt called stating that he had a visit with MMM on 11/13/20 for diverticulitis and was prescribed some medicine for it. Pt says the medicine has helped but he doesn't feel 100% better and needs a refill called in to [facility] in [place].  Please advise and call patient.

## 2020-11-27 NOTE — Telephone Encounter (Signed)
Due to the long weekend, can go ahead and send a refill of those 2 antibiotics for him the Cipro and the Flagyl.  But typically if he has continued pain like this I would like him to have an appointment.  If he is still not improving by early next week then please get him an appointment.  We just do not have anything today.

## 2020-11-27 NOTE — Telephone Encounter (Signed)
Pt informed. Offered to make an appt for next week. Pt states that he will call early next week if no better for an appt. Refills of ATB's sent to [facility]

## 2020-12-08 ENCOUNTER — Telehealth: Payer: Self-pay | Admitting: Family Medicine

## 2020-12-08 ENCOUNTER — Ambulatory Visit: Payer: Medicaid Other | Admitting: Physical Therapy

## 2020-12-08 ENCOUNTER — Other Ambulatory Visit: Payer: Self-pay

## 2020-12-08 DIAGNOSIS — R42 Dizziness and giddiness: Secondary | ICD-10-CM

## 2020-12-08 NOTE — Telephone Encounter (Signed)
Entering new referral after receiving the following message:  Bradley Torres arrived this afternoon for his therapy evaluation, his chief complaint is dizziness. After speaking with the therapist, the Physical Therapist thinks he would benefit from Vestibular Rehab for the dizziness. If you agree could you please add this to the referral or create a new referral and send to our [facility]. If you have any questions, please feel free to let me know.

## 2020-12-08 NOTE — Therapy (Signed)
[facility] [place], [place], [address] Phone: [phone]   Fax:  [phone]  Patient Details  Name: Bradley Torres MRN: [id number] Date of Birth: May 19, 1960 Referring Provider:  [name], [name]  Encounter Date: 12/08/2020  Patient presents to the clinic today with a CC of dizziness.  He states that this has been on ongoing for years.  Informed patient we would contact his MD office for a referral for dizziness so that he can be scheduled with a Neuro PT. [name], [place] MPT 12/08/2020, 2:11 PM  [facility] [address] [place], [place], [address] Phone: [phone]   Fax:  [phone]

## 2020-12-17 ENCOUNTER — Other Ambulatory Visit: Payer: Self-pay | Admitting: Cardiovascular Disease

## 2020-12-25 DIAGNOSIS — Z79899 Other long term (current) drug therapy: Secondary | ICD-10-CM | POA: Diagnosis not present

## 2020-12-30 DIAGNOSIS — Z79899 Other long term (current) drug therapy: Secondary | ICD-10-CM | POA: Diagnosis not present

## 2020-12-31 ENCOUNTER — Encounter (HOSPITAL_COMMUNITY): Payer: Self-pay | Admitting: Physical Therapy

## 2020-12-31 ENCOUNTER — Ambulatory Visit (HOSPITAL_COMMUNITY): Payer: Medicaid Other | Attending: Family Medicine | Admitting: Physical Therapy

## 2020-12-31 ENCOUNTER — Other Ambulatory Visit: Payer: Self-pay

## 2020-12-31 DIAGNOSIS — M25512 Pain in left shoulder: Secondary | ICD-10-CM | POA: Insufficient documentation

## 2020-12-31 DIAGNOSIS — G8929 Other chronic pain: Secondary | ICD-10-CM | POA: Diagnosis not present

## 2020-12-31 DIAGNOSIS — R42 Dizziness and giddiness: Secondary | ICD-10-CM | POA: Insufficient documentation

## 2020-12-31 NOTE — Therapy (Signed)
[place], [place], [address] Phone: [phone]   Fax:  [phone]  Physical Therapy Evaluation  Patient Details  Name: Bradley Torres MRN: [id number] Date of Birth: 1960-07-16 Referring Provider (PT): Hendricks Limes [name]   Encounter Date: 12/31/2020   PT End of Session - 12/31/20 1505     Visit Number 1    Number of Visits 8    Date for PT Re-Evaluation 01/28/21    Authorization Type Medicaid [employer]    Authorization Time Period Check auth    PT Start Time 1440   late   PT Stop Time 1510    PT Time Calculation (min) 30 min    Activity Tolerance Patient tolerated treatment well    Behavior During Therapy [facility] for tasks assessed/performed             Past Medical History:  Diagnosis Date   CAD (coronary artery disease)    a. 12/2017: cath showing 10% Proximal-LAD stenosis with no significant obstructive disease and LVEDP mildly elevated at 18 mm Hg.    Chronic back pain    Diabetes mellitus, type 2 (HCC)    DVT (deep venous thrombosis) (HCC)    Essential hypertension    MI (myocardial infarction) ([place]) 2019   Morbid obesity ([place])    Panic disorder    Shoulder pain, left    Following fall   Sleep apnea    Stroke ([place]) 01/2018   Vitamin D deficiency     Past Surgical History:  Procedure Laterality Date   Ankle fracture sugery Left    BIOPSY  07/20/2020   Procedure: BIOPSY;  Surgeon: Daneil Dolin, MD;  [facility];  Service: Endoscopy;;  cecal polyp   COLONOSCOPY WITH PROPOFOL N/A 05/29/2017   Dr. [name]: Multiple tubular adenomas removed, prep inadequate.  Short interval surveillance colonoscopy recommended in 6 months   COLONOSCOPY WITH PROPOFOL N/A 07/20/2020   Procedure: COLONOSCOPY WITH PROPOFOL;  Surgeon: Daneil Dolin, MD;  [facility];  Service: Endoscopy;  Laterality: N/A;  am appt, early as possible since diabetic   LEFT HEART CATH AND CORONARY ANGIOGRAPHY  N/A 01/23/2018   Procedure: LEFT HEART CATH AND CORONARY ANGIOGRAPHY;  Surgeon: Troy Sine, MD;  Location: [facility];  Service: Cardiovascular;  Laterality: N/A;   LUMBAR SPINE SURGERY     fusion   POLYPECTOMY  05/29/2017   Procedure: POLYPECTOMY;  Surgeon: Daneil Dolin, MD;  [facility];  Service: Endoscopy;;  ascending colon polyp x5-cs transverse colon polyp x4- cs descending colon polyp x1- hs sigmoid colon polyp x1 -hs rectal polyp x1- hs   TONSILLECTOMY      There were no vitals filed for this visit.    Subjective Assessment - 12/31/20 1448     Subjective Patient says he gets dizzy when he moves. He likes to work on cars and when he bends down he gets dizzy. He says when he stands up he feels like he is "high". He also says he feels heavy and that his head "is the size of a watermelon". This is chronic in nature and says he has not had treatment for this prior. No change to hearing or vision. He says his blood pressure is fine.    Pertinent History Multiple strokes, Abnormal MRI findings.    Limitations Other (comment)   bending, standing   Patient Stated Goals Being able to function standing up  [facility] PT Assessment - 12/31/20 0001       Assessment   Medical Diagnosis Vertigo    Referring Provider (PT) Hendricks Limes [name]      Precautions   Precautions Fall      Restrictions   Weight Bearing Restrictions No      Balance Screen   Has the patient fallen in the past 6 months No      [place] residence    Living Arrangements Alone      Prior Function   Level of Independence Independent      Cognition   Overall Cognitive Status Within Functional Limits for tasks assessed                    Vestibular Assessment - 12/31/20 0001       Oculomotor Exam   Smooth Pursuits Intact   provoked dizziness/ feeling "high"   Saccades Poor trajectory;Slow                 Objective measurements completed on examination: See above findings.                PT Education - 12/31/20 1458     Education Details on evaluation findings, POC and HEP    Person(s) Educated Patient    Methods Handout;Explanation    Comprehension Verbalized understanding              PT Short Term Goals - 12/31/20 1622       PT SHORT TERM GOAL #1   Title Patient will be independent with initial HEP and self-management strategies to improve functional outcomes    Time 2    Period Weeks    Status New    Target Date 01/14/21               PT Long Term Goals - 12/31/20 1623       PT LONG TERM GOAL #1   Title Patient will be independent with advanced HEP and self-management strategies to improve functional outcomes    Time 4    Period Weeks    Status New    Target Date 01/28/21      PT LONG TERM GOAL #2   Title Patient will report at least 50% overall improvement in subjective complaint to indicate improvement in ability to perform ADLs.    Time 4    Period Weeks    Status New    Target Date 01/28/21      PT LONG TERM GOAL #3   Title Patient will report at least 50% reduction in frequency of dizziness/ vertigo for improved functional outcome and reduced risk for falls.    Time 4    Period Weeks    Status New    Target Date 01/28/21                    Plan - 12/31/20 1509     Clinical Impression Statement Patient is a 60 y.o. male who presents to physical therapy with complaint of vertigo. Patient demonstrates poor visual tracking and increased symptoms with vestibular provocative testing which are negatively impacting patient ability to perform ADLs and functional mobility tasks. Patient will benefit from skilled physical therapy services to address these deficits to improve level of function with ADLs, functional mobility tasks, and reduce risk for falls. Todays assessment somewhat limited due to time constraints per patient  late arrival.    Personal Factors  and Comorbidities Fitness;Time since onset of injury/illness/exacerbation;Behavior Pattern    Examination-Activity Limitations Locomotion Level;Lift;Stand;Transfers    Examination-Participation Restrictions Other;Community Activity    Stability/Clinical Decision Making Stable/Uncomplicated    Clinical Decision Making Low    Rehab Potential Fair    PT Frequency 2x / week    PT Duration 4 weeks    PT Treatment/Interventions ADLs/Self Care Home Management;Biofeedback;Canalith Repostioning;Cryotherapy;Ultrasound;Parrafin;Fluidtherapy;Therapeutic activities;Patient/family education;Vestibular;Vasopneumatic Device;Taping;Joint Manipulations;Compression bandaging;Orthotic Fit/Training;Therapeutic exercise;Balance training;Neuromuscular re-education;Passive range of motion;Scar mobilization;Dry needling;Visual/perceptual remediation/compensation;Manual techniques;Manual lymph drainage;Energy conservation;Spinal Manipulations;Functional mobility training;Stair training;Gait training;DME Instruction;Contrast Bath;Electrical Stimulation;Iontophoresis 4mg /ml Dexamethasone;Moist Heat;Traction    PT Next Visit Plan Review HEP and progress habituation activity/ general conditioning as tolerated. VOR, seated head turns, nods, static balance    PT Home Exercise Plan Eval: saccades, smooth pursuit    Consulted and Agree with Plan of Care Patient             Patient will benefit from skilled therapeutic intervention in order to improve the following deficits and impairments:  Dizziness, Obesity  Visit Diagnosis: Dizziness and giddiness     Problem List Patient Active Problem List   Diagnosis Date Noted   Hx pulmonary embolism 10/24/2020   Paroxysmal atrial fibrillation ([place]) 12/31/2019   Vitamin D deficiency    Type 2 diabetes mellitus with hyperglycemia, without long-term current use of insulin ([place])    Sleep related hypoxia 11/01/2019   COPD mixed type ([place])  11/01/2019   Severe obstructive sleep apnea-hypopnea syndrome 08/01/2019   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   Arteriovenous malformation 08/01/2019   Primary osteoarthritis involving multiple joints 06/21/2019   Dysphagia 10/17/2018   Anticoagulated by anticoagulation treatment 03/06/2018   Hx of non-ST elevation myocardial infarction (NSTEMI) 03/06/2018   Chronic diastolic CHF (congestive heart failure) (HCC)    Chronic pain syndrome    CKD (chronic kidney disease) stage 3, GFR 30-59 ml/min ([place]) 01/20/2018   Morbid (severe) obesity due to excess calories ([place]) 01/20/2018   Disorder of left rotator cuff 11/01/2017   Hx of adenomatous colonic polyps 07/26/2017   Family hx of colon cancer 05/03/2017   Hypertension associated with diabetes ([place]) 01/23/2014   Chronic bilateral back pain 01/23/2014   4:29 PM, 12/31/20 Josue Hector PT DPT  Physical Therapist with [facility]  [phone] [place] [address] [place], [place], [address] Phone: [phone]   Fax:  [phone]  Name: Bradley Torres MRN: [id number] Date of Birth: 21-May-1960

## 2021-01-05 ENCOUNTER — Other Ambulatory Visit: Payer: Self-pay

## 2021-01-05 ENCOUNTER — Encounter (HOSPITAL_COMMUNITY): Payer: Self-pay

## 2021-01-05 ENCOUNTER — Ambulatory Visit (HOSPITAL_COMMUNITY): Payer: Medicaid Other

## 2021-01-05 DIAGNOSIS — R42 Dizziness and giddiness: Secondary | ICD-10-CM

## 2021-01-05 DIAGNOSIS — M25512 Pain in left shoulder: Secondary | ICD-10-CM | POA: Diagnosis not present

## 2021-01-05 DIAGNOSIS — G8929 Other chronic pain: Secondary | ICD-10-CM | POA: Diagnosis not present

## 2021-01-05 NOTE — Therapy (Signed)
[facility] [place], [place], [address] Phone: [phone]   Fax:  [phone]  Physical Therapy Treatment  Patient Details  Name: Bradley Torres MRN: [id number] Date of Birth: 1960/10/20 Referring Provider (PT): Hendricks Limes [name]   Encounter Date: 01/05/2021   PT End of Session - 01/05/21 1408     Visit Number 2    Number of Visits 8    Date for PT Re-Evaluation 01/28/21    Authorization Type Medicaid [employer]    PT Start Time 7001    PT Stop Time 7494    PT Time Calculation (min) 38 min    Activity Tolerance Patient tolerated treatment well    Behavior During Therapy [facility] for tasks assessed/performed             Past Medical History:  Diagnosis Date   CAD (coronary artery disease)    a. 12/2017: cath showing 10% Proximal-LAD stenosis with no significant obstructive disease and LVEDP mildly elevated at 18 mm Hg.    Chronic back pain    Diabetes mellitus, type 2 (HCC)    DVT (deep venous thrombosis) (HCC)    Essential hypertension    MI (myocardial infarction) ([place]) 2019   Morbid obesity ([place])    Panic disorder    Shoulder pain, left    Following fall   Sleep apnea    Stroke ([place]) 01/2018   Vitamin D deficiency     Past Surgical History:  Procedure Laterality Date   Ankle fracture sugery Left    BIOPSY  07/20/2020   Procedure: BIOPSY;  Surgeon: Daneil Dolin, MD;  [facility];  Service: Endoscopy;;  cecal polyp   COLONOSCOPY WITH PROPOFOL N/A 05/29/2017   Dr. [name]: Multiple tubular adenomas removed, prep inadequate.  Short interval surveillance colonoscopy recommended in 6 months   COLONOSCOPY WITH PROPOFOL N/A 07/20/2020   Procedure: COLONOSCOPY WITH PROPOFOL;  Surgeon: Daneil Dolin, MD;  [facility];  Service: Endoscopy;  Laterality: N/A;  am appt, early as possible since diabetic   LEFT HEART CATH AND CORONARY ANGIOGRAPHY N/A 01/23/2018   Procedure: LEFT HEART CATH AND  CORONARY ANGIOGRAPHY;  Surgeon: Troy Sine, MD;  Location: [place] [facility];  Service: Cardiovascular;  Laterality: N/A;   LUMBAR SPINE SURGERY     fusion   POLYPECTOMY  05/29/2017   Procedure: POLYPECTOMY;  Surgeon: Daneil Dolin, MD;  [facility];  Service: Endoscopy;;  ascending colon polyp x5-cs transverse colon polyp x4- cs descending colon polyp x1- hs sigmoid colon polyp x1 -hs rectal polyp x1- hs   TONSILLECTOMY      There were no vitals filed for this visit.   Subjective Assessment - 01/05/21 1407     Subjective Pt reports constant dizziness, when standing and moving around.  No reoprts of dizziness when sitting.  No reports of recent falls.    Pertinent History Multiple strokes, Abnormal MRI findings.    Patient Stated Goals Being able to function standing up                [facility] PT Assessment - 01/05/21 0001       Assessment   Medical Diagnosis Vertigo    Referring Provider (PT) Hendricks Limes [name]    Next MD Visit 01/21/21      Precautions   Precautions Fall  Vestibular Treatment/Exercise - 01/05/21 0001       Vestibular Treatment/Exercise   Vestibular Treatment Provided Gaze;Habituation    Habituation Exercises Comment   chin to knee with visual focus 5x; 90 degrees turns 5x each   Gaze Exercises X1 Viewing Horizontal;X2 Viewing Horizontal;X2 Viewing Vertical;Eye/Head Exercise Horizontal;Eye/Head Exercise Vertical      X1 Viewing Horizontal   Foot Position NBOS static floor    Reps 10      X2 Viewing Horizontal   Foot Position NBOS static floor    Reps 10      X2 Viewing Vertical   Foot Position NBOS static floor      Eye/Head Exercise Horizontal   Foot Position NBOS static floor    Reps 20    Comments No reports of dizziness change with any movement      Eye/Head Exercise Vertical   Foot Position NBOS static floor    Comments increased dizziness with head movements looking up,  increases slightly with reps, no nystagmus                    PT Education - 01/05/21 1416     Education Details Reviewed goals, reviewed gazing exercises    Person(s) Educated Patient    Methods Explanation;Demonstration    Comprehension Verbalized understanding;Returned demonstration;Verbal cues required              PT Short Term Goals - 12/31/20 1622       PT SHORT TERM GOAL #1   Title Patient will be independent with initial HEP and self-management strategies to improve functional outcomes    Time 2    Period Weeks    Status New    Target Date 01/14/21               PT Long Term Goals - 12/31/20 1623       PT LONG TERM GOAL #1   Title Patient will be independent with advanced HEP and self-management strategies to improve functional outcomes    Time 4    Period Weeks    Status New    Target Date 01/28/21      PT LONG TERM GOAL #2   Title Patient will report at least 50% overall improvement in subjective complaint to indicate improvement in ability to perform ADLs.    Time 4    Period Weeks    Status New    Target Date 01/28/21      PT LONG TERM GOAL #3   Title Patient will report at least 50% reduction in frequency of dizziness/ vertigo for improved functional outcome and reduced risk for falls.    Time 4    Period Weeks    Status New    Target Date 01/28/21                   Plan - 01/05/21 1434     Clinical Impression Statement Reviewed goals, educated importance of HEP complaince for maximal benefits, pt able to recall and demonstrate appropriate gaze based exercises.  Progressed to 2 gaze balance acitvities with standing with NBOS.  Pt reports decreased ability for prolonged standing and reports of increased LBP following standing balance activities, monitored through session wiht periodic seated rest breaks required.  Pt able to demonstrate no reoprts of dizziness with any horizontal based gazing, reports of increased dizziness  with vertical head not eye movements.  Added habituation exercises that brought on dizziness wiht STS and 90 degree  movements.  Also reports of increased eye fatigue with activities.  Educated on benefits continuing gazing exercises for strengthening, added 2 gaze to HEP.    Personal Factors and Comorbidities Fitness;Time since onset of injury/illness/exacerbation;Behavior Pattern    Examination-Activity Limitations Locomotion Level;Lift;Stand;Transfers    Examination-Participation Restrictions Other;Community Activity    Stability/Clinical Decision Making Stable/Uncomplicated    Clinical Decision Making Low    Rehab Potential Fair    PT Frequency 2x / week    PT Duration 4 weeks    PT Treatment/Interventions ADLs/Self Care Home Management;Biofeedback;Canalith Repostioning;Cryotherapy;Ultrasound;Parrafin;Fluidtherapy;Therapeutic activities;Patient/family education;Vestibular;Vasopneumatic Device;Taping;Joint Manipulations;Compression bandaging;Orthotic Fit/Training;Therapeutic exercise;Balance training;Neuromuscular re-education;Passive range of motion;Scar mobilization;Dry needling;Visual/perceptual remediation/compensation;Manual techniques;Manual lymph drainage;Energy conservation;Spinal Manipulations;Functional mobility training;Stair training;Gait training;DME Instruction;Contrast Bath;Electrical Stimulation;Iontophoresis 4mg /ml Dexamethasone;Moist Heat;Traction    PT Next Visit Plan Progress habituation activity/ general conditioning as tolerated. VOR, head turns, nods, static balance.  Progress to standing balance activities as no reports of dizziness in seated position.    PT Home Exercise Plan Eval: saccades, smooth pursuit; 10/11: 2 point gazing exercises    Consulted and Agree with Plan of Care Patient             Patient will benefit from skilled therapeutic intervention in order to improve the following deficits and impairments:  Dizziness, Obesity  Visit Diagnosis: Dizziness and  giddiness     Problem List Patient Active Problem List   Diagnosis Date Noted   Hx pulmonary embolism 10/24/2020   Paroxysmal atrial fibrillation ([place]) 12/31/2019   Vitamin D deficiency    Type 2 diabetes mellitus with hyperglycemia, without long-term current use of insulin ([place])    Sleep related hypoxia 11/01/2019   COPD mixed type ([place]) 11/01/2019   Severe obstructive sleep apnea-hypopnea syndrome 08/01/2019   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   Arteriovenous malformation 08/01/2019   Primary osteoarthritis involving multiple joints 06/21/2019   Dysphagia 10/17/2018   Anticoagulated by anticoagulation treatment 03/06/2018   Hx of non-ST elevation myocardial infarction (NSTEMI) 03/06/2018   Chronic diastolic CHF (congestive heart failure) (HCC)    Chronic pain syndrome    CKD (chronic kidney disease) stage 3, GFR 30-59 ml/min ([place]) 01/20/2018   Morbid (severe) obesity due to excess calories ([place]) 01/20/2018   Disorder of left rotator cuff 11/01/2017   Hx of adenomatous colonic polyps 07/26/2017   Family hx of colon cancer 05/03/2017   Hypertension associated with diabetes ([place]) 01/23/2014   Chronic bilateral back pain 01/23/2014   Ihor Austin, LPTA/CLT; CBIS [phone]  Aldona Lento, PTA 01/05/2021, 6:53 PM  [facility] [address] [place], [place], [address] Phone: [phone]   Fax:  [phone]  Name: Bradley Torres MRN: [id number] Date of Birth: 1961/02/23

## 2021-01-19 ENCOUNTER — Encounter (HOSPITAL_COMMUNITY): Payer: Self-pay

## 2021-01-19 ENCOUNTER — Other Ambulatory Visit: Payer: Self-pay

## 2021-01-19 ENCOUNTER — Ambulatory Visit (HOSPITAL_COMMUNITY): Payer: Medicaid Other

## 2021-01-19 DIAGNOSIS — R42 Dizziness and giddiness: Secondary | ICD-10-CM | POA: Diagnosis not present

## 2021-01-19 DIAGNOSIS — G8929 Other chronic pain: Secondary | ICD-10-CM | POA: Diagnosis not present

## 2021-01-19 DIAGNOSIS — M25512 Pain in left shoulder: Secondary | ICD-10-CM | POA: Diagnosis not present

## 2021-01-19 NOTE — Therapy (Signed)
[facility] [address] [place], [place], [address] Phone: [phone]   Fax:  [phone]  Physical Therapy Treatment  Patient Details  Name: Bradley Torres MRN: [id number] Date of Birth: [birth year] Referring Provider (PT): Hendricks Limes [name]   Encounter Date: 01/19/2021   PT End of Session - 01/19/21 1729     Visit Number 3    Number of Visits 8    Date for PT Re-Evaluation 01/28/21    Authorization Type Medicaid [employer]    Authorization Time Period 8 visits approved 10/07-->01/28/21    PT Start Time 1702    PT Stop Time 1740    PT Time Calculation (min) 38 min    Activity Tolerance Patient tolerated treatment well;Patient limited by fatigue    Behavior During Therapy [facility] for tasks assessed/performed             Past Medical History:  Diagnosis Date   CAD (coronary artery disease)    a. 12/2017: cath showing 10% Proximal-LAD stenosis with no significant obstructive disease and LVEDP mildly elevated at 18 mm Hg.    Chronic back pain    Diabetes mellitus, type 2 (HCC)    DVT (deep venous thrombosis) (HCC)    Essential hypertension    MI (myocardial infarction) ([place]) 2019   Morbid obesity ([place])    Panic disorder    Shoulder pain, left    Following fall   Sleep apnea    Stroke ([place]) 01/2018   Vitamin D deficiency     Past Surgical History:  Procedure Laterality Date   Ankle fracture sugery Left    BIOPSY  07/20/2020   Procedure: BIOPSY;  Surgeon: Daneil Dolin, MD;  [facility];  Service: Endoscopy;;  cecal polyp   COLONOSCOPY WITH PROPOFOL N/A 05/29/2017   Dr. [name]: Multiple tubular adenomas removed, prep inadequate.  Short interval surveillance colonoscopy recommended in 6 months   COLONOSCOPY WITH PROPOFOL N/A 07/20/2020   Procedure: COLONOSCOPY WITH PROPOFOL;  Surgeon: Daneil Dolin, MD;  [facility];  Service: Endoscopy;  Laterality: N/A;  am appt, early as possible since diabetic    LEFT HEART CATH AND CORONARY ANGIOGRAPHY N/A 01/23/2018   Procedure: LEFT HEART CATH AND CORONARY ANGIOGRAPHY;  Surgeon: Troy Sine, MD;  Location: [facility];  Service: Cardiovascular;  Laterality: N/A;   LUMBAR SPINE SURGERY     fusion   POLYPECTOMY  05/29/2017   Procedure: POLYPECTOMY;  Surgeon: Daneil Dolin, MD;  [facility];  Service: Endoscopy;;  ascending colon polyp x5-cs transverse colon polyp x4- cs descending colon polyp x1- hs sigmoid colon polyp x1 -hs rectal polyp x1- hs   TONSILLECTOMY      There were no vitals filed for this visit.   Subjective Assessment - 01/19/21 1705     Subjective Pt reports dizziness unchanged since last session.  Stated he is in constant pain everywhere.  Has been compliant with HEP daily.    Pertinent History Multiple strokes, Abnormal MRI findings.    Patient Stated Goals Being able to function standing up    Currently in Pain? No/denies    Multiple Pain Sites --   5-6/10 dizzy scale                               Vestibular Treatment/Exercise - 01/19/21 0001       Vestibular Treatment/Exercise  Vestibular Treatment Provided Gaze    Gaze Exercises Eye/Head Exercise Horizontal;Eye/Head Exercise Vertical      X1 Viewing Horizontal   Reps 10      Eye/Head Exercise Horizontal   Foot Position partial tandem stance with eye then head movement    Reps 10    Comments no reports of increased dizziness horizontal head turns      Eye/Head Exercise Vertical   Foot Position partial tandem stance with eye then head movement    Comments no change looking up and down                      PT Short Term Goals - 12/31/20 1622       PT SHORT TERM GOAL #1   Title Patient will be independent with initial HEP and self-management strategies to improve functional outcomes    Time 2    Period Weeks    Status New    Target Date 01/14/21               PT Long Term Goals -  12/31/20 1623       PT LONG TERM GOAL #1   Title Patient will be independent with advanced HEP and self-management strategies to improve functional outcomes    Time 4    Period Weeks    Status New    Target Date 01/28/21      PT LONG TERM GOAL #2   Title Patient will report at least 50% overall improvement in subjective complaint to indicate improvement in ability to perform ADLs.    Time 4    Period Weeks    Status New    Target Date 01/28/21      PT LONG TERM GOAL #3   Title Patient will report at least 50% reduction in frequency of dizziness/ vertigo for improved functional outcome and reduced risk for falls.    Time 4    Period Weeks    Status New    Target Date 01/28/21                   Plan - 01/19/21 1748     Clinical Impression Statement Session focus with balance training during gazing activities.  Pt limited by fatigue, required periodic seated rest breaks through session.  Reports of increased dizziness following each rest break with increased time for dizziness to resolve.  Dizziness only during standing exercises and increased dizziness wiht vertical movements vs horizontal.  No nystagmus noted through session.  No reports of change at EOS.    Personal Factors and Comorbidities Fitness;Time since onset of injury/illness/exacerbation;Behavior Pattern    Examination-Activity Limitations Locomotion Level;Lift;Stand;Transfers    Examination-Participation Restrictions Other;Community Activity    Stability/Clinical Decision Making Stable/Uncomplicated    Clinical Decision Making Low    Rehab Potential Fair    PT Frequency 2x / week    PT Duration 4 weeks    PT Treatment/Interventions ADLs/Self Care Home Management;Biofeedback;Canalith Repostioning;Cryotherapy;Ultrasound;Parrafin;Fluidtherapy;Therapeutic activities;Patient/family education;Vestibular;Vasopneumatic Device;Taping;Joint Manipulations;Compression bandaging;Orthotic Fit/Training;Therapeutic  exercise;Balance training;Neuromuscular re-education;Passive range of motion;Scar mobilization;Dry needling;Visual/perceptual remediation/compensation;Manual techniques;Manual lymph drainage;Energy conservation;Spinal Manipulations;Functional mobility training;Stair training;Gait training;DME Instruction;Contrast Bath;Electrical Stimulation;Iontophoresis 4mg /ml Dexamethasone;Moist Heat;Traction    PT Next Visit Plan Progress habituation activity/ general conditioning as tolerated. VOR, head turns, nods, static balance.  Progress to standing balance activities as no reports of dizziness in seated position.    PT Home Exercise Plan Eval: saccades, smooth pursuit; 10/11: 2 point gazing exercises; 10/25: 2 point gazing exercises in  partial tandem stance wiht HHA    Consulted and Agree with Plan of Care Patient             Patient will benefit from skilled therapeutic intervention in order to improve the following deficits and impairments:  Dizziness, Obesity  Visit Diagnosis: Dizziness and giddiness     Problem List Patient Active Problem List   Diagnosis Date Noted   Hx pulmonary embolism 10/24/2020   Paroxysmal atrial fibrillation ([place]) 12/31/2019   Vitamin D deficiency    Type 2 diabetes mellitus with hyperglycemia, without long-term current use of insulin ([place])    Sleep related hypoxia 11/01/2019   COPD mixed type ([place]) 11/01/2019   Severe obstructive sleep apnea-hypopnea syndrome 08/01/2019   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   Arteriovenous malformation 08/01/2019   Primary osteoarthritis involving multiple joints 06/21/2019   Dysphagia 10/17/2018   Anticoagulated by anticoagulation treatment 03/06/2018   Hx of non-ST elevation myocardial infarction (NSTEMI) 03/06/2018   Chronic diastolic CHF (congestive heart failure) (HCC)    Chronic pain syndrome    CKD (chronic kidney disease) stage 3, GFR 30-59 ml/min ([place]) 01/20/2018   Morbid (severe) obesity due to  excess calories ([place]) 01/20/2018   Disorder of left rotator cuff 11/01/2017   Hx of adenomatous colonic polyps 07/26/2017   Family hx of colon cancer 05/03/2017   Hypertension associated with diabetes ([place]) 01/23/2014   Chronic bilateral back pain 01/23/2014   Ihor Austin, LPTA/CLT; CBIS [phone]  Aldona Lento, PTA 01/19/2021, 5:53 PM  [facility] [address] [place], [place], [address] Phone: [phone]   Fax:  [phone]  Name: Bradley Torres MRN: [id number] Date of Birth: January 05, 1961

## 2021-01-21 ENCOUNTER — Encounter (HOSPITAL_COMMUNITY): Payer: Self-pay | Admitting: Physical Therapy

## 2021-01-21 ENCOUNTER — Encounter: Payer: Medicaid Other | Admitting: Family Medicine

## 2021-01-21 ENCOUNTER — Ambulatory Visit (HOSPITAL_COMMUNITY): Payer: Medicaid Other | Admitting: Physical Therapy

## 2021-01-21 ENCOUNTER — Other Ambulatory Visit: Payer: Self-pay

## 2021-01-21 DIAGNOSIS — G8929 Other chronic pain: Secondary | ICD-10-CM

## 2021-01-21 DIAGNOSIS — R42 Dizziness and giddiness: Secondary | ICD-10-CM | POA: Diagnosis not present

## 2021-01-21 DIAGNOSIS — M25512 Pain in left shoulder: Secondary | ICD-10-CM | POA: Diagnosis not present

## 2021-01-21 NOTE — Therapy (Signed)
[place], [place], [address] Phone: [phone]   Fax:  [phone]  Physical Therapy Treatment  Patient Details  Name: Bradley Torres MRN: [id number] Date of Birth: May 28, 1960 Referring Provider (PT): Hendricks Limes [name]   Encounter Date: 01/21/2021   PT End of Session - 01/21/21 1317     Visit Number 4    Number of Visits 8    Date for PT Re-Evaluation 01/28/21    Authorization Type Medicaid Healthy [employer]    Authorization Time Period 8 visits approved 10/07-->01/28/21    PT Start Time 1540    PT Stop Time 1356    PT Time Calculation (min) 39 min    Activity Tolerance Patient tolerated treatment well;Patient limited by fatigue    Behavior During Therapy [facility] for tasks assessed/performed             Past Medical History:  Diagnosis Date   CAD (coronary artery disease)    a. 12/2017: cath showing 10% Proximal-LAD stenosis with no significant obstructive disease and LVEDP mildly elevated at 18 mm Hg.    Chronic back pain    Diabetes mellitus, type 2 (HCC)    DVT (deep venous thrombosis) (HCC)    Essential hypertension    MI (myocardial infarction) ([place]) 2019   Morbid obesity ([place])    Panic disorder    Shoulder pain, left    Following fall   Sleep apnea    Stroke ([place]) 01/2018   Vitamin D deficiency     Past Surgical History:  Procedure Laterality Date   Ankle fracture sugery Left    BIOPSY  07/20/2020   Procedure: BIOPSY;  Surgeon: Daneil Dolin, MD;  [facility];  Service: Endoscopy;;  cecal polyp   COLONOSCOPY WITH PROPOFOL N/A 05/29/2017   Dr. [name]: Multiple tubular adenomas removed, prep inadequate.  Short interval surveillance colonoscopy recommended in 6 months   COLONOSCOPY WITH PROPOFOL N/A 07/20/2020   Procedure: COLONOSCOPY WITH PROPOFOL;  Surgeon: Daneil Dolin, MD;  [facility];  Service: Endoscopy;  Laterality: N/A;  am appt, early as possible since diabetic    LEFT HEART CATH AND CORONARY ANGIOGRAPHY N/A 01/23/2018   Procedure: LEFT HEART CATH AND CORONARY ANGIOGRAPHY;  Surgeon: Troy Sine, MD;  Location: [facility];  Service: Cardiovascular;  Laterality: N/A;   LUMBAR SPINE SURGERY     fusion   POLYPECTOMY  05/29/2017   Procedure: POLYPECTOMY;  Surgeon: Daneil Dolin, MD;  [facility];  Service: Endoscopy;;  ascending colon polyp x5-cs transverse colon polyp x4- cs descending colon polyp x1- hs sigmoid colon polyp x1 -hs rectal polyp x1- hs   TONSILLECTOMY      There were no vitals filed for this visit.   Subjective Assessment - 01/21/21 1322     Subjective States he hasn't had any time to do any exercises at home as he was gone all day yesterday. Reports that he has no change in symptoms reports 6/10.    Pertinent History Multiple strokes, Abnormal MRI findings.    Patient Stated Goals Being able to function standing up    Currently in Pain? Yes    Pain Score 6    dizziness               [facility] PT Assessment - 01/21/21 0001       Assessment   Medical Diagnosis Vertigo    Referring Provider (PT) Karsten Fells  Blanch Media [name]                            Vestibular Treatment/Exercise - 01/21/21 0001       Vestibular Treatment/Exercise   Gaze Exercises X1 Viewing Horizontal;X2 Viewing Horizontal      Eye/Head Exercise Horizontal   Foot Position partial tandem stance with eye then head movement    Reps 10    Comments increased light headedness wiith transition   3 sets               BP sitting 122/74 BP standing 90/66    PT Education - 01/21/21 1400     Education Details on current presentation, on BP readings, on BP monitoriing, on typical BP symptoms    Person(s) Educated Patient    Methods Explanation    Comprehension Verbalized understanding              PT Short Term Goals - 12/31/20 1622       PT SHORT TERM GOAL #1   Title Patient will be independent with  initial HEP and self-management strategies to improve functional outcomes    Time 2    Period Weeks    Status New    Target Date 01/14/21               PT Long Term Goals - 12/31/20 1623       PT LONG TERM GOAL #1   Title Patient will be independent with advanced HEP and self-management strategies to improve functional outcomes    Time 4    Period Weeks    Status New    Target Date 01/28/21      PT LONG TERM GOAL #2   Title Patient will report at least 50% overall improvement in subjective complaint to indicate improvement in ability to perform ADLs.    Time 4    Period Weeks    Status New    Target Date 01/28/21      PT LONG TERM GOAL #3   Title Patient will report at least 50% reduction in frequency of dizziness/ vertigo for improved functional outcome and reduced risk for falls.    Time 4    Period Weeks    Status New    Target Date 01/28/21                   Plan - 01/21/21 1400     Clinical Impression Statement Patient with symptoms of "wobbliness" "things going dim" and "lightheadedness with transition from sit to stand. Measured BP and patient with 122/74 BP in sitting and 90/66 with standing. Educated patient on orthostatics, following up with MD and messaged MD about findings. Patient ot see MD today. Session mainly on education on BP and self monitoring of symptoms/BP.    Personal Factors and Comorbidities Fitness;Time since onset of injury/illness/exacerbation;Behavior Pattern    Examination-Activity Limitations Locomotion Level;Lift;Stand;Transfers    Examination-Participation Restrictions Other;Community Activity    Stability/Clinical Decision Making Stable/Uncomplicated    Rehab Potential Fair    PT Frequency 2x / week    PT Duration 4 weeks    PT Treatment/Interventions ADLs/Self Care Home Management;Biofeedback;Canalith Repostioning;Cryotherapy;Ultrasound;Parrafin;Fluidtherapy;Therapeutic activities;Patient/family  education;Vestibular;Vasopneumatic Device;Taping;Joint Manipulations;Compression bandaging;Orthotic Fit/Training;Therapeutic exercise;Balance training;Neuromuscular re-education;Passive range of motion;Scar mobilization;Dry needling;Visual/perceptual remediation/compensation;Manual techniques;Manual lymph drainage;Energy conservation;Spinal Manipulations;Functional mobility training;Stair training;Gait training;DME Instruction;Contrast Bath;Electrical Stimulation;Iontophoresis 4mg /ml Dexamethasone;Moist Heat;Traction    PT Next Visit Plan f/u with BP/MD apt. Progress habituation activity/ general conditioning  as tolerated. VOR, head turns, nods, static balance.  Progress to standing balance activities as no reports of dizziness in seated position.    PT Home Exercise Plan Eval: saccades, smooth pursuit; 10/11: 2 point gazing exercises; 10/25: 2 point gazing exercises in partial tandem stance wiht HHA    Consulted and Agree with Plan of Care Patient             Patient will benefit from skilled therapeutic intervention in order to improve the following deficits and impairments:  Dizziness, Obesity  Visit Diagnosis: Dizziness and giddiness  Chronic left shoulder pain     Problem List Patient Active Problem List   Diagnosis Date Noted   Hx pulmonary embolism 10/24/2020   Paroxysmal atrial fibrillation ([place]) 12/31/2019   Vitamin D deficiency    Type 2 diabetes mellitus with hyperglycemia, without long-term current use of insulin ([place])    Sleep related hypoxia 11/01/2019   COPD mixed type ([place]) 11/01/2019   Severe obstructive sleep apnea-hypopnea syndrome 08/01/2019   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   Arteriovenous malformation 08/01/2019   Primary osteoarthritis involving multiple joints 06/21/2019   Dysphagia 10/17/2018   Anticoagulated by anticoagulation treatment 03/06/2018   Hx of non-ST elevation myocardial infarction (NSTEMI) 03/06/2018   Chronic diastolic  CHF (congestive heart failure) (HCC)    Chronic pain syndrome    CKD (chronic kidney disease) stage 3, GFR 30-59 ml/min ([place]) 01/20/2018   Morbid (severe) obesity due to excess calories ([place]) 01/20/2018   Disorder of left rotator cuff 11/01/2017   Hx of adenomatous colonic polyps 07/26/2017   Family hx of colon cancer 05/03/2017   Hypertension associated with diabetes ([place]) 01/23/2014   Chronic bilateral back pain 01/23/2014    Eliezer Champagne, PT 01/21/2021, 2:01 PM  [facility] [address] [place], [place], [address] Phone: [phone]   Fax:  [phone]  Name: Bradley Torres MRN: [id number] Date of Birth: 03-15-1961

## 2021-01-25 DIAGNOSIS — Z79899 Other long term (current) drug therapy: Secondary | ICD-10-CM | POA: Diagnosis not present

## 2021-01-26 ENCOUNTER — Encounter (HOSPITAL_COMMUNITY): Payer: Medicaid Other | Admitting: Physical Therapy

## 2021-01-27 ENCOUNTER — Encounter: Payer: Medicaid Other | Admitting: Family Medicine

## 2021-01-27 DIAGNOSIS — Z79899 Other long term (current) drug therapy: Secondary | ICD-10-CM | POA: Diagnosis not present

## 2021-01-28 ENCOUNTER — Encounter (HOSPITAL_COMMUNITY): Payer: Medicaid Other | Admitting: Physical Therapy

## 2021-02-02 ENCOUNTER — Encounter (HOSPITAL_COMMUNITY): Payer: Medicaid Other | Admitting: Physical Therapy

## 2021-02-04 ENCOUNTER — Encounter (HOSPITAL_COMMUNITY): Payer: Medicaid Other | Admitting: Physical Therapy

## 2021-02-11 ENCOUNTER — Encounter: Payer: Self-pay | Admitting: Family Medicine

## 2021-02-11 ENCOUNTER — Ambulatory Visit (INDEPENDENT_AMBULATORY_CARE_PROVIDER_SITE_OTHER): Payer: Medicaid Other | Admitting: Family Medicine

## 2021-02-11 ENCOUNTER — Telehealth: Payer: Self-pay | Admitting: Pharmacist

## 2021-02-11 ENCOUNTER — Other Ambulatory Visit: Payer: Self-pay

## 2021-02-11 VITALS — BP 121/68 | HR 96 | Temp 95.0°F | Ht 73.0 in | Wt 397.0 lb

## 2021-02-11 DIAGNOSIS — E1159 Type 2 diabetes mellitus with other circulatory complications: Secondary | ICD-10-CM | POA: Diagnosis not present

## 2021-02-11 DIAGNOSIS — Z0001 Encounter for general adult medical examination with abnormal findings: Secondary | ICD-10-CM | POA: Diagnosis not present

## 2021-02-11 DIAGNOSIS — I951 Orthostatic hypotension: Secondary | ICD-10-CM | POA: Diagnosis not present

## 2021-02-11 DIAGNOSIS — E559 Vitamin D deficiency, unspecified: Secondary | ICD-10-CM | POA: Diagnosis not present

## 2021-02-11 DIAGNOSIS — Z86711 Personal history of pulmonary embolism: Secondary | ICD-10-CM

## 2021-02-11 DIAGNOSIS — Z23 Encounter for immunization: Secondary | ICD-10-CM | POA: Diagnosis not present

## 2021-02-11 DIAGNOSIS — E1165 Type 2 diabetes mellitus with hyperglycemia: Secondary | ICD-10-CM | POA: Diagnosis not present

## 2021-02-11 DIAGNOSIS — Z0000 Encounter for general adult medical examination without abnormal findings: Secondary | ICD-10-CM

## 2021-02-11 DIAGNOSIS — I152 Hypertension secondary to endocrine disorders: Secondary | ICD-10-CM

## 2021-02-11 DIAGNOSIS — N1831 Chronic kidney disease, stage 3a: Secondary | ICD-10-CM | POA: Diagnosis not present

## 2021-02-11 LAB — BAYER DCA HB A1C WAIVED: HB A1C (BAYER DCA - WAIVED): 9.6 % — ABNORMAL HIGH (ref 4.8–5.6)

## 2021-02-11 LAB — CBC WITH DIFFERENTIAL/PLATELET
Basophils Absolute: 0.1 10*3/uL (ref 0.0–0.2)
Basos: 1 %
EOS (ABSOLUTE): 0.2 10*3/uL (ref 0.0–0.4)
Eos: 3 %
Hematocrit: 46.4 % (ref 37.5–51.0)
Hemoglobin: 14.9 g/dL (ref 13.0–17.7)
Immature Grans (Abs): 0.1 10*3/uL (ref 0.0–0.1)
Immature Granulocytes: 1 %
Lymphocytes Absolute: 1.1 10*3/uL (ref 0.7–3.1)
Lymphs: 12 %
MCH: 31.1 pg (ref 26.6–33.0)
MCHC: 32.1 g/dL (ref 31.5–35.7)
MCV: 97 fL (ref 79–97)
Monocytes Absolute: 0.8 10*3/uL (ref 0.1–0.9)
Monocytes: 8 %
Neutrophils Absolute: 7 10*3/uL (ref 1.4–7.0)
Neutrophils: 75 %
Platelets: 314 10*3/uL (ref 150–450)
RBC: 4.79 x10E6/uL (ref 4.14–5.80)
RDW: 12 % (ref 11.6–15.4)
WBC: 9.3 10*3/uL (ref 3.4–10.8)

## 2021-02-11 LAB — CMP14+EGFR
ALT: 22 IU/L (ref 0–44)
AST: 15 IU/L (ref 0–40)
Albumin/Globulin Ratio: 1.4 (ref 1.2–2.2)
Albumin: 4 g/dL (ref 3.8–4.9)
Alkaline Phosphatase: 90 IU/L (ref 44–121)
BUN/Creatinine Ratio: 13 (ref 9–20)
BUN: 18 mg/dL (ref 6–24)
Bilirubin Total: 0.4 mg/dL (ref 0.0–1.2)
CO2: 27 mmol/L (ref 20–29)
Calcium: 9.5 mg/dL (ref 8.7–10.2)
Chloride: 95 mmol/L — ABNORMAL LOW (ref 96–106)
Creatinine, Ser: 1.41 mg/dL — ABNORMAL HIGH (ref 0.76–1.27)
Globulin, Total: 2.8 g/dL (ref 1.5–4.5)
Glucose: 320 mg/dL — ABNORMAL HIGH (ref 70–99)
Potassium: 4 mmol/L (ref 3.5–5.2)
Sodium: 138 mmol/L (ref 134–144)
Total Protein: 6.8 g/dL (ref 6.0–8.5)
eGFR: 57 mL/min/{1.73_m2} — ABNORMAL LOW (ref >59)

## 2021-02-11 LAB — LIPID PANEL
Chol/HDL Ratio: 4.2 ratio (ref 0.0–5.0)
Cholesterol, Total: 137 mg/dL (ref 100–199)
HDL: 33 mg/dL — ABNORMAL LOW (ref >39)
LDL Chol Calc (NIH): 71 mg/dL (ref 0–99)
Triglycerides: 194 mg/dL — ABNORMAL HIGH (ref 0–149)
VLDL Cholesterol Cal: 33 mg/dL (ref 5–40)

## 2021-02-11 MED ORDER — RIVAROXABAN 20 MG PO TABS: 20.0000 mg | ORAL_TABLET | Freq: Every day | ORAL | 1 refills | Status: DC

## 2021-02-11 MED ORDER — TRULICITY 0.75 MG/0.5ML ~~LOC~~ SOAJ: 0.7500 mg | SUBCUTANEOUS | 2 refills | Status: DC

## 2021-02-11 MED ORDER — DAPAGLIFLOZIN PROPANEDIOL 10 MG PO TABS: 10.0000 mg | ORAL_TABLET | Freq: Every day | ORAL | 1 refills | Status: DC

## 2021-02-11 NOTE — Progress Notes (Signed)
Assessment & Plan:  1. Well adult exam Preventive health education provided. We will check [facility] for TDAP. - Lipid panel - CBC with Differential/Platelet - CMP14+EGFR  2. Type 2 diabetes mellitus with hyperglycemia, without long-term current use of insulin (Bradley Torres) Lab Results  Component Value Date   HGBA1C 8.2 (H) 10/21/2020   HGBA1C 8.0 (H) 07/15/2020   HGBA1C 7.7 (H) 12/25/2019    - Diabetes is not at goal of A1c < 7. His A1c has continued to climb despite starting [place].  - Medications:  continue [place]. Stop Januvia. Start Bradley Torres 3.50 mg once weekly. First dose given in office today. - Home glucose monitoring: does not check and is not agreeable in doing so. - Patient is currently taking a statin. Patient is taking an ACE-inhibitor/ARB.  - Instruction/counseling given: reminded to get eye exam, discussed the need for weight loss, and discussed diet  Diabetes Health Maintenance Due  Topic Date Due   OPHTHALMOLOGY EXAM  Never done   HEMOGLOBIN A1C  04/23/2021   FOOT EXAM  07/15/2021    Lab Results  Component Value Date   LABMICR 40.0 05/08/2018   - Lipid panel - CBC with Differential/Platelet - CMP14+EGFR - Bayer DCA Hb A1c Waived - dapagliflozin propanediol (Bradley Torres) 10 MG TABS tablet; Take 1 tablet (10 mg total) by mouth daily before breakfast.  Dispense: 90 tablet; Refill: 1 - Dulaglutide (Bradley Torres) 0.93 GH/8.2XH SOPN; Inject 0.75 mg into the skin once a week.  Dispense: 2 mL; Refill: 2  3. Hypertension associated with diabetes (Bradley Torres) Well controlled on current regimen.  - Lipid panel - CBC with Differential/Platelet - CMP14+EGFR  4. Orthostatic hypotension Cardiology made aware.  5. Morbid (severe) obesity due to excess calories (Bradley Torres) Started on Bradley Torres. Current weight: 397 lbs. - Lipid panel - CBC with Differential/Platelet - CMP14+EGFR - Dulaglutide (Bradley Torres) 3.71 IR/6.7EL SOPN; Inject 0.75 mg into the skin once a week.  Dispense: 2  mL; Refill: 2  6. Stage 3a chronic kidney disease (Bradley Torres) Continue Bradley Torres. - CMP14+EGFR - dapagliflozin propanediol (Bradley Torres) 10 MG TABS tablet; Take 1 tablet (10 mg total) by mouth daily before breakfast.  Dispense: 90 tablet; Refill: 1  7. Hx pulmonary embolism Continue Bradley Torres. - rivaroxaban (Bradley Torres) 20 MG TABS tablet; Take 1 tablet (20 mg total) by mouth daily with supper.  Dispense: 90 tablet; Refill: 1  8. Vitamin D deficiency Labs to assess. - VITAMIN D 25 Hydroxy (Vit-D Deficiency, Fractures)  9. Need for immunization against influenza - Flu Vaccine QUAD 2moIM (Fluarix, Fluzone & Bradley Torres)   Follow-up: Return in about 6 weeks (around 03/25/2021) for DM with JAlmyra Torres 3 months with me for DM.   BHendricks Limes MSN, APRN, Torres[facility]C [facility]  Subjective:  Patient ID: Bradley Torres male    DOB: [birth year] Age: 60y.o. MRN: [id number] Patient Care Team: Bradley Torres as PCP - General (Family Medicine) Bradley Torres as PCP - Cardiology (Cardiology)   CC:  Chief Complaint  Patient presents with   Annual Exam    HPI [name] for his annual physical.   Occupation: disabled, Marital status: single, Substance use: not currently Diet: regular, Exercise: none Last eye exam: never - agreeable to coming here for retinal imaging Last dental exam: never Last colonoscopy: 07/20/2020 with repeat in 7 years (2029) Lung Cancer Screening with low-dose Chest CT: never smoker Hepatitis C Screening: negative on 12/16/2019 PSA: WNL on 05/13/2019 Immunizations: Flu Vaccine:  agreeable in getting today Tdap Vaccine:  declined, but feels he had one in the past 10 years. We do not have record of this.    Shingrix Vaccine: up to date  COVID-19 Vaccine: up to date Pneumonia Vaccine: up to date  Foss 2/9 Scores 02/11/2021 11/13/2020 10/21/2020 07/15/2020 02/18/2020 06/21/2019 05/15/2019  PHQ - 2 Score 0 0 0 0 4 0  2  PHQ- 9 Score - - - 7 10 - 6  Exception Documentation - - - - - - -  Not completed - - - - - - -    Diabetes: Patient presents for follow up of diabetes. Current symptoms include: hyperglycemia. Known diabetic complications: cardiovascular disease and cerebrovascular disease. Medication compliance: yes - Bradley Torres 10 mg QD and Januvia 50 mg QD. Current diet:  unchanged - in general healthy . Current exercise: none. Home blood sugar records: patient does not check sugars. Is he  on ACE inhibitor or angiotensin II receptor blocker? Yes (valsartan). Is he on a statin? Yes (atorvastatin).   Hypertension: patient goes to the hypertension clinic in [place]. He mentioned today that he does not think they are aware of his orthostatic hypotension. The therapist with his vestibular rehab reported a sitting BP of 122/74 which dropped to 90/66 standing. He is still dizzy despite doing the rehab.     Review of Systems  Constitutional:  Negative for chills, fever, malaise/fatigue and weight loss.  HENT:  Negative for congestion, ear discharge, ear pain, nosebleeds, sinus pain, sore throat and tinnitus.   Eyes:  Negative for blurred vision, double vision, pain, discharge and redness.  Respiratory:  Negative for cough, shortness of breath and wheezing.   Cardiovascular:  Negative for chest pain, palpitations and leg swelling.  Gastrointestinal:  Negative for abdominal pain, constipation, diarrhea, heartburn, nausea and vomiting.  Genitourinary:  Negative for dysuria, frequency and urgency.  Musculoskeletal:  Positive for back pain and joint pain. Negative for myalgias.  Skin:  Negative for rash.  Neurological:  Positive for dizziness. Negative for seizures, weakness and headaches.  Psychiatric/Behavioral:  Negative for depression, substance abuse and suicidal ideas. The patient is not nervous/anxious.     Current Outpatient Medications:    atorvastatin (LIPITOR) 10 MG tablet, Take 1 tablet (10 mg total)  by mouth daily., Disp: 90 tablet, Rfl: 1   chlorthalidone (HYGROTON) 25 MG tablet, TAKE ONE TABLET BY MOUTH EVERY DAY, Disp: 30 tablet, Rfl: 6   dapagliflozin propanediol (Bradley Torres) 10 MG TABS tablet, Take 1 tablet (10 mg total) by mouth daily before breakfast., Disp: 90 tablet, Rfl: 1   gabapentin (NEURONTIN) 300 MG capsule, Take 1 capsule by mouth daily as needed (pain)., Disp: , Rfl:    glucose blood (ONETOUCH VERIO) test strip, USE TO TEST BLOOD SUGAR DAILY AS DIRECTED. DX: 11.9, Disp: 100 each, Rfl: 12   hydrALAZINE (APRESOLINE) 50 MG tablet, Take 50 mg by mouth daily at 2 PM., Disp: , Rfl:    OneTouch Delica Lancets 96V MISC, USE TO TEST BLOOD SUGAR DAILY AS DIRECTED. DX: 11.9, Disp: 100 each, Rfl: 11   oxyCODONE-acetaminophen (PERCOCET) 10-325 MG tablet, Take 1 tablet by mouth 3 (three) times daily as needed for pain., Disp: , Rfl:    rivaroxaban (Bradley Torres) 20 MG TABS tablet, Take 1 tablet (20 mg total) by mouth daily with supper., Disp: 90 tablet, Rfl: 1   sitaGLIPtin (JANUVIA) 50 MG tablet, Take 1 tablet (50 mg total) by mouth daily., Disp: 90 tablet, Rfl: 1  valsartan (DIOVAN) 320 MG tablet, TAKE ONE TABLET BY MOUTH DAILY, Disp: 30 tablet, Rfl: 6  Allergies  Allergen Reactions   Lisinopril Other (See Comments)    wheezing   Bystolic [Nebivolol Hcl] Swelling   Ibuprofen Other (See Comments)    Rectal bleed   Spironolactone Hives   Amlodipine Itching and Other (See Comments)    Leg pain.   Diflucan [Fluconazole] Rash   Hctz [Hydrochlorothiazide] Other (See Comments)    Caused gout flares.   Losartan Rash    Past Medical History:  Diagnosis Date   CAD (coronary artery disease)    a. 12/2017: cath showing 10% Proximal-LAD stenosis with no significant obstructive disease and LVEDP mildly elevated at 18 mm Hg.    Chronic back pain    Diabetes mellitus, type 2 (Bradley Torres)    DVT (deep venous thrombosis) (Bradley Torres)    Essential hypertension    MI (myocardial infarction) ([place]) 2019   Morbid  obesity ([place])    Panic disorder    Shoulder pain, left    Following fall   Sleep apnea    Stroke ([place]) 01/2018   Vitamin D deficiency     Past Surgical History:  Procedure Laterality Date   Ankle fracture sugery Left    BIOPSY  07/20/2020   Procedure: BIOPSY;  Surgeon: Daneil Dolin, Torres;  [facility];  Service: Endoscopy;;  cecal polyp   COLONOSCOPY WITH PROPOFOL N/A 05/29/2017   Dr. [name]: Multiple tubular adenomas removed, prep inadequate.  Short interval surveillance colonoscopy recommended in 6 months   COLONOSCOPY WITH PROPOFOL N/A 07/20/2020   Procedure: COLONOSCOPY WITH PROPOFOL;  Surgeon: Daneil Dolin, Torres;  [facility];  Service: Endoscopy;  Laterality: N/A;  am appt, early as possible since diabetic   LEFT HEART CATH AND CORONARY ANGIOGRAPHY N/A 01/23/2018   Procedure: LEFT HEART CATH AND CORONARY ANGIOGRAPHY;  Surgeon: Troy Sine, Torres;  Location: [facility];  Service: Cardiovascular;  Laterality: N/A;   LUMBAR SPINE SURGERY     fusion   POLYPECTOMY  05/29/2017   Procedure: POLYPECTOMY;  Surgeon: Daneil Dolin, Torres;  [facility];  Service: Endoscopy;;  ascending colon polyp x5-cs transverse colon polyp x4- cs descending colon polyp x1- hs sigmoid colon polyp x1 -hs rectal polyp x1- hs   TONSILLECTOMY      Family History  Problem Relation Age of Onset   Colon cancer Mother        died at age 31   Heart attack Father    Hepatitis Sister 89       Autoimmune   Heart attack Paternal Grandfather     Social History   Socioeconomic History   Marital status: Single    Spouse name: Not on file   Number of children: Not on file   Years of education: Not on file   Highest education level: Not on file  Occupational History   Not on file  Tobacco Use   Smoking status: Never   Smokeless tobacco: Never  Vaping Use   Vaping Use: Never used  Substance and Sexual Activity   Alcohol use: Yes    Comment: occ   Drug use:  Not Currently    Types: Marijuana    Comment: daily; 05/19/20 no marijuana in past 6 months d/t going to pain management clinic   Sexual activity: Not Currently    Birth control/protection: None  Other Topics Concern   Not on file  Social  History Narrative   Not on file   Social Determinants of Health   Financial Resource Strain: Medium Risk   Difficulty of Paying Living Expenses: Somewhat hard  Food Insecurity: Not on file  Transportation Needs: Not on file  Physical Activity: Not on file  Stress: Not on file  Social Connections: Not on file  Intimate Partner Violence: Not on file      Objective:    BP 121/68   Pulse 96   Temp (!) 95 F (35 C)   Ht 6' 1"  (1.854 m)   Wt (!) 397 lb (180.1 kg)   SpO2 94%   BMI 52.38 kg/m   Wt Readings from Last 3 Encounters:  02/11/21 (!) 397 lb (180.1 kg)  11/13/20 (!) 400 lb (181.4 kg)  10/21/20 (!) 404 lb (183.3 kg)    Physical Exam Vitals reviewed.  Constitutional:      General: He is not in acute distress.    Appearance: Normal appearance. He is morbidly obese. He is not ill-appearing, toxic-appearing or diaphoretic.  HENT:     Head: Normocephalic and atraumatic.     Right Ear: Tympanic membrane, ear canal and external ear normal. There is no impacted cerumen.     Left Ear: Tympanic membrane, ear canal and external ear normal. There is no impacted cerumen.     Nose: Nose normal. No congestion or rhinorrhea.     Mouth/Throat:     Mouth: Mucous membranes are moist.     Pharynx: Oropharynx is clear. No oropharyngeal exudate or posterior oropharyngeal erythema.  Eyes:     General: No scleral icterus.       Right eye: No discharge.        Left eye: No discharge.     Conjunctiva/sclera: Conjunctivae normal.     Pupils: Pupils are equal, round, and reactive to light.  Neck:     Vascular: No carotid bruit.  Cardiovascular:     Rate and Rhythm: Normal rate and regular rhythm.     Heart sounds: Normal heart sounds. No murmur  heard.   No friction rub. No gallop.  Pulmonary:     Effort: Pulmonary effort is normal. No respiratory distress.     Breath sounds: Normal breath sounds. No stridor. No wheezing, rhonchi or rales.  Abdominal:     General: Abdomen is flat. Bowel sounds are normal. There is no distension.     Palpations: Abdomen is soft. There is no hepatomegaly, splenomegaly or mass.     Tenderness: There is no abdominal tenderness. There is no guarding or rebound.     Hernia: No hernia is present.  Musculoskeletal:        General: Normal range of motion.     Cervical back: Normal range of motion and neck supple. No rigidity. No muscular tenderness.     Right lower leg: No edema.     Left lower leg: No edema.  Lymphadenopathy:     Cervical: No cervical adenopathy.  Skin:    General: Skin is warm and dry.     Capillary Refill: Capillary refill takes less than 2 seconds.  Neurological:     General: No focal deficit present.     Mental Status: He is alert and oriented to person, place, and time. Mental status is at baseline.  Psychiatric:        Mood and Affect: Mood normal.        Behavior: Behavior normal.        Thought Content: Thought  content normal.        Judgment: Judgment normal.    Lab Results  Component Value Date   TSH 2.840 05/13/2019   Lab Results  Component Value Date   WBC 8.9 07/15/2020   HGB 14.5 07/15/2020   HCT 44.4 07/15/2020   MCV 96 07/15/2020   PLT 303 07/15/2020   Lab Results  Component Value Date   NA 139 10/21/2020   K 4.4 10/21/2020   CO2 26 10/21/2020   GLUCOSE 295 (H) 10/21/2020   BUN 26 (H) 10/21/2020   CREATININE 1.73 (H) 10/21/2020   BILITOT 0.5 10/21/2020   ALKPHOS 98 10/21/2020   AST 18 10/21/2020   ALT 19 10/21/2020   PROT 7.2 10/21/2020   ALBUMIN 4.3 10/21/2020   CALCIUM 9.3 10/21/2020   ANIONGAP 10 04/10/2019   EGFR 45 (L) 10/21/2020   Lab Results  Component Value Date   CHOL 192 07/15/2020   Lab Results  Component Value Date   HDL  39 (L) 07/15/2020   Lab Results  Component Value Date   LDLCALC 128 (H) 07/15/2020   Lab Results  Component Value Date   TRIG 136 07/15/2020   Lab Results  Component Value Date   CHOLHDL 4.9 07/15/2020   Lab Results  Component Value Date   HGBA1C 8.2 (H) 10/21/2020

## 2021-02-11 NOTE — Telephone Encounter (Signed)
Recommended new start trulicity Denies personal and family history of Medullary thyroid cancer ([place]) Demonstration and teach back provided Patient able to inject on his own here in office Bradley Torres See PharmD in 6 weeks for titrtation of GLP1

## 2021-02-12 LAB — HM DIABETES EYE EXAM

## 2021-02-12 NOTE — Progress Notes (Signed)
No tdap in ncir

## 2021-02-23 ENCOUNTER — Telehealth: Payer: Self-pay | Admitting: Family Medicine

## 2021-02-23 NOTE — Telephone Encounter (Signed)
Pt called requesting to speak with Bradley Torres about the new Trulicity medicine that recently started taking. Pt says he cant take Trulicity because it is making him feel really dizzy.   Please advise and call patient.

## 2021-02-23 NOTE — Telephone Encounter (Signed)
Please make sure patient is checking blood sugars (and blood pressure)-->sometimes dizziness can come from change in blood sugars, but trulicity is not commonly associated with dizziness --I did not formally see him (just provided side education on trulicity)  He can schedule with me or PCP for medication alternative  Would stop trulicity for now to see if dizziness stops

## 2021-02-23 NOTE — Telephone Encounter (Signed)
He needs an appointment

## 2021-02-23 NOTE — Telephone Encounter (Signed)
Patient aware and verbalizes understanding.  States that he would like to speak with joyce or julie directly to figure out what is going on.  I tried to help patient but he states he would really like to speak to one of yall directly at your earliest convince.

## 2021-02-24 DIAGNOSIS — E119 Type 2 diabetes mellitus without complications: Secondary | ICD-10-CM | POA: Diagnosis not present

## 2021-02-24 DIAGNOSIS — Z79899 Other long term (current) drug therapy: Secondary | ICD-10-CM | POA: Diagnosis not present

## 2021-02-24 DIAGNOSIS — M539 Dorsopathy, unspecified: Secondary | ICD-10-CM | POA: Diagnosis not present

## 2021-02-24 NOTE — Telephone Encounter (Signed)
Appointment scheduled.

## 2021-03-03 ENCOUNTER — Ambulatory Visit: Payer: Medicaid Other | Admitting: Family Medicine

## 2021-03-17 ENCOUNTER — Encounter: Payer: Self-pay | Admitting: Family Medicine

## 2021-03-25 ENCOUNTER — Ambulatory Visit (INDEPENDENT_AMBULATORY_CARE_PROVIDER_SITE_OTHER): Payer: Medicaid Other | Admitting: Pharmacist

## 2021-03-25 ENCOUNTER — Other Ambulatory Visit: Payer: Self-pay | Admitting: Cardiovascular Disease

## 2021-03-25 ENCOUNTER — Other Ambulatory Visit: Payer: Self-pay

## 2021-03-25 DIAGNOSIS — E1165 Type 2 diabetes mellitus with hyperglycemia: Secondary | ICD-10-CM

## 2021-03-25 MED ORDER — METFORMIN HCL ER 500 MG PO TB24: 500.0000 mg | ORAL_TABLET | Freq: Two times a day (BID) | ORAL | 3 refills | Status: DC

## 2021-03-25 MED ORDER — SITAGLIPTIN PHOSPHATE 100 MG PO TABS: 100.0000 mg | ORAL_TABLET | Freq: Every day | ORAL | 3 refills | Status: DC

## 2021-03-25 NOTE — Progress Notes (Signed)
° ° °  03/25/2021 Name: Bradley Torres MRN: [id number] DOB: [birth year]   S:  38 yoM Presents for diabetes evaluation, education, and management Patient was referred and last seen by Primary Care Provider on 02/11/2021.  Insurance coverage/medication affordability: medicaid  Patient reports adherence with medications, but has experienced side effects from trulicity. Current diabetes medications include: farxiga, januvia  Current hyperlipidemia medications include: atorvastatin 10mg   LDL 74 in 2022   Patient denies hypoglycemic events.   Discussed meal planning options and Plate method for healthy eating Avoid sugary drinks and desserts Incorporate balanced protein, non starchy veggies, 1 serving of carbohydrate with each meal Increase water intake Increase physical activity as able  Patient-reported exercise habits: n/a  Patient reports neuropathy (nerve pain).  Patient reports visual changes.  Patient denies self foot exams.    O:  Lab Results  Component Value Date   HGBA1C 9.6 (H) 02/11/2021     Lipid Panel     Component Value Date/Time   CHOL 137 02/11/2021 1025   TRIG 194 (H) 02/11/2021 1025   HDL 33 (L) 02/11/2021 1025   CHOLHDL 4.2 02/11/2021 1025   CHOLHDL 5.1 01/20/2018 0209   VLDL 26 01/20/2018 0209   LDLCALC 71 02/11/2021 1025   Home fasting blood sugars: >200  2 hour post-meal/random blood sugars: >200    Clinical Atherosclerotic Cardiovascular Disease (ASCVD): Yes   The ASCVD Risk score (Arnett DK, et al., 2019) failed to calculate for the following reasons:   The patient has a prior MI or stroke diagnosis    A/P:  Diabetes T2DM currently UNCONTROLLED. Patient was unable to continue on Trulicity due to multiple reported side effects (shortness of breath, dry cough, GI issues, reports sugar increased while on it).  He tried for 3-4 weeks on low dose Trulicity 0.75mg  sq weekly and has discontinued since.   Control is suboptimal due to  intolerances.  Trulicity --unable to take  (documented in intolerance list) Start metformin XR low dose--start 500mg  with breakfast and work up to 500mg  twice daily with breakfast & dinner  (GFR 57) Continue Farxiga  Increase januvia to 100mg  daily  -Extensively discussed pathophysiology of diabetes, recommended lifestyle interventions, dietary effects on blood sugar control  -Counseled on s/sx of and management of hypoglycemia  -Next A1C anticipated 3 months.    Written patient instructions provided.  Total time in face to face counseling 30 minutes.   Follow up PCP Clinic Visit on 05/14/21  Regina Eck, PharmD, [place] Clinical Pharmacist, [place]  II Phone [phone]

## 2021-03-30 DIAGNOSIS — M539 Dorsopathy, unspecified: Secondary | ICD-10-CM | POA: Diagnosis not present

## 2021-03-30 DIAGNOSIS — Z79899 Other long term (current) drug therapy: Secondary | ICD-10-CM | POA: Diagnosis not present

## 2021-03-30 DIAGNOSIS — E119 Type 2 diabetes mellitus without complications: Secondary | ICD-10-CM | POA: Diagnosis not present

## 2021-04-08 ENCOUNTER — Ambulatory Visit (HOSPITAL_BASED_OUTPATIENT_CLINIC_OR_DEPARTMENT_OTHER): Payer: Medicaid Other | Admitting: Cardiovascular Disease

## 2021-04-29 DIAGNOSIS — E559 Vitamin D deficiency, unspecified: Secondary | ICD-10-CM | POA: Diagnosis not present

## 2021-04-29 DIAGNOSIS — Z79899 Other long term (current) drug therapy: Secondary | ICD-10-CM | POA: Diagnosis not present

## 2021-04-29 DIAGNOSIS — M539 Dorsopathy, unspecified: Secondary | ICD-10-CM | POA: Diagnosis not present

## 2021-04-29 DIAGNOSIS — E7800 Pure hypercholesterolemia, unspecified: Secondary | ICD-10-CM | POA: Diagnosis not present

## 2021-04-29 DIAGNOSIS — R5383 Other fatigue: Secondary | ICD-10-CM | POA: Diagnosis not present

## 2021-04-29 DIAGNOSIS — M129 Arthropathy, unspecified: Secondary | ICD-10-CM | POA: Diagnosis not present

## 2021-04-29 DIAGNOSIS — R030 Elevated blood-pressure reading, without diagnosis of hypertension: Secondary | ICD-10-CM | POA: Diagnosis not present

## 2021-04-29 DIAGNOSIS — E119 Type 2 diabetes mellitus without complications: Secondary | ICD-10-CM | POA: Diagnosis not present

## 2021-04-29 DIAGNOSIS — Z6841 Body Mass Index (BMI) 40.0 and over, adult: Secondary | ICD-10-CM | POA: Diagnosis not present

## 2021-04-29 DIAGNOSIS — E1165 Type 2 diabetes mellitus with hyperglycemia: Secondary | ICD-10-CM | POA: Diagnosis not present

## 2021-04-29 LAB — CBC: RBC: 4.51 (ref 3.87–5.11)

## 2021-04-29 LAB — CBC AND DIFFERENTIAL
Hemoglobin: 14.5 (ref 13.5–17.5)
Platelets: 286 (ref 150–399)
WBC: 12.4

## 2021-04-29 LAB — LIPID PANEL
Cholesterol: 154 (ref 0–200)
HDL: 36 (ref 35–70)
LDL Cholesterol: 86
Triglycerides: 160 (ref 40–160)

## 2021-04-29 LAB — BASIC METABOLIC PANEL
CO2: 29 — AB (ref 13–22)
Chloride: 98 — AB (ref 99–108)
Glucose: 251
Potassium: 4.1 (ref 3.4–5.3)

## 2021-04-29 LAB — HEMOGLOBIN A1C: Hemoglobin A1C: 9

## 2021-04-29 LAB — HEPATIC FUNCTION PANEL
ALT: 18 (ref 10–40)
AST: 19 (ref 14–40)
Alkaline Phosphatase: 73 (ref 25–125)

## 2021-04-29 LAB — TSH: TSH: 2.69 (ref 0.41–5.90)

## 2021-04-29 LAB — VITAMIN D 25 HYDROXY (VIT D DEFICIENCY, FRACTURES): Vit D, 25-Hydroxy: 13.94

## 2021-05-14 ENCOUNTER — Ambulatory Visit: Payer: Medicaid Other | Admitting: Family Medicine

## 2021-05-20 ENCOUNTER — Encounter: Payer: Self-pay | Admitting: Family Medicine

## 2021-05-20 ENCOUNTER — Ambulatory Visit (INDEPENDENT_AMBULATORY_CARE_PROVIDER_SITE_OTHER): Payer: Medicaid Other | Admitting: Family Medicine

## 2021-05-20 VITALS — BP 136/69 | HR 56 | Temp 96.9°F | Ht 73.0 in | Wt 396.0 lb

## 2021-05-20 DIAGNOSIS — E1159 Type 2 diabetes mellitus with other circulatory complications: Secondary | ICD-10-CM

## 2021-05-20 DIAGNOSIS — R5383 Other fatigue: Secondary | ICD-10-CM

## 2021-05-20 DIAGNOSIS — I480 Paroxysmal atrial fibrillation: Secondary | ICD-10-CM | POA: Diagnosis not present

## 2021-05-20 DIAGNOSIS — M109 Gout, unspecified: Secondary | ICD-10-CM | POA: Diagnosis not present

## 2021-05-20 DIAGNOSIS — N1831 Chronic kidney disease, stage 3a: Secondary | ICD-10-CM | POA: Diagnosis not present

## 2021-05-20 DIAGNOSIS — E559 Vitamin D deficiency, unspecified: Secondary | ICD-10-CM

## 2021-05-20 DIAGNOSIS — G4733 Obstructive sleep apnea (adult) (pediatric): Secondary | ICD-10-CM

## 2021-05-20 DIAGNOSIS — G894 Chronic pain syndrome: Secondary | ICD-10-CM | POA: Diagnosis not present

## 2021-05-20 DIAGNOSIS — I252 Old myocardial infarction: Secondary | ICD-10-CM | POA: Diagnosis not present

## 2021-05-20 DIAGNOSIS — I5032 Chronic diastolic (congestive) heart failure: Secondary | ICD-10-CM | POA: Diagnosis not present

## 2021-05-20 DIAGNOSIS — Z86711 Personal history of pulmonary embolism: Secondary | ICD-10-CM | POA: Diagnosis not present

## 2021-05-20 DIAGNOSIS — E1165 Type 2 diabetes mellitus with hyperglycemia: Secondary | ICD-10-CM | POA: Diagnosis not present

## 2021-05-20 DIAGNOSIS — I152 Hypertension secondary to endocrine disorders: Secondary | ICD-10-CM

## 2021-05-20 MED ORDER — ATORVASTATIN CALCIUM 10 MG PO TABS: 10.0000 mg | ORAL_TABLET | Freq: Every day | ORAL | 1 refills | Status: DC

## 2021-05-20 MED ORDER — VITAMIN D (ERGOCALCIFEROL) 1.25 MG (50000 UNIT) PO CAPS: 50000.0000 [IU] | ORAL_CAPSULE | ORAL | 1 refills | Status: DC

## 2021-05-20 MED ORDER — ALLOPURINOL 100 MG PO TABS: 100.0000 mg | ORAL_TABLET | Freq: Every day | ORAL | 2 refills | Status: DC

## 2021-05-20 NOTE — Progress Notes (Signed)
Assessment & Plan:  1. Type 2 diabetes mellitus with hyperglycemia, without long-term current use of insulin (HCC) Lab Results  Component Value Date   HGBA1C 9.6 (H) 02/11/2021   HGBA1C 8.2 (H) 10/21/2020   HGBA1C 8.0 (H) 07/15/2020    - Diabetes is not at goal of A1c < 7. - Medications: continue current medications, but take 2 metformin in the morning instead of just one since he is forgetting to take the evening dose - Patient is currently taking a statin. Patient is taking an ACE-inhibitor/ARB.   Diabetes Health Maintenance Due  Topic Date Due   FOOT EXAM  07/15/2021   HEMOGLOBIN A1C  08/11/2021   OPHTHALMOLOGY EXAM  02/12/2022    Lab Results  Component Value Date   LABMICR 40.0 05/08/2018   - atorvastatin (LIPITOR) 10 MG tablet; Take 1 tablet (10 mg total) by mouth daily.  Dispense: 90 tablet; Refill: 1  2. Hypertension associated with diabetes ([place]) Well controlled on current regimen. Managed by hypertension clinic.  3. Paroxysmal atrial fibrillation (HCC) Well controlled on current regimen.   4. Hx pulmonary embolism Continue Xarelto.  5. Chronic diastolic CHF (congestive heart failure) ([place]) Well controlled on current regimen. Managed by cardiology.  6. Hx of non-ST elevation myocardial infarction (NSTEMI) - atorvastatin (LIPITOR) 10 MG tablet; Take 1 tablet (10 mg total) by mouth daily.  Dispense: 90 tablet; Refill: 1  7. Gout without tophus Started Allopurinol. - allopurinol (ZYLOPRIM) 100 MG tablet; Take 1 tablet (100 mg total) by mouth daily.  Dispense: 30 tablet; Refill: 2  8. Vitamin D deficiency Started weekly vitamin D. - Vitamin D, Ergocalciferol, (DRISDOL) 1.25 MG (50000 UNIT) CAPS capsule; Take 1 capsule (50,000 Units total) by mouth every 7 (seven) days.  Dispense: 12 capsule; Refill: 1  9. Severe obstructive sleep apnea-hypopnea syndrome Does not wear CPAP.  10. Stage 3a chronic kidney disease ([place]) Continue Farxiga.  11. Morbid (severe)  obesity due to excess calories ([place]) Encouraged healthy eating and exercise.  12. Chronic pain syndrome Managed by pain management.  13. Fatigue, unspecified type - Testosterone,Free and Total; Future - patient will return to have this lab completed prior to 10 AM.   Return in about 3 months (around 08/17/2021) for follow-up of chronic medication conditions.  Hendricks Limes, MSN, APRN, [name]-C [facility]  Subjective:    Patient ID: Bradley Torres, male    DOB: 10/28/1960, 61 y.o.   MRN: [id number]  Patient Care Team: [name], [name] as PCP - General (Family Medicine) Skeet Latch, MD as PCP - Cardiology (Cardiology)   Chief Complaint:  Chief Complaint  Patient presents with   Diabetes    3 month follow up     HPI: Bradley Torres is a 61 y.o. male presenting on 05/20/2021 for Diabetes (3 month follow up )  Hypertension: managed by the hypertension clinic.  Diabetes: Patient presents for follow up of diabetes. Current symptoms include: hyperglycemia. Known diabetic complications: cardiovascular disease and cerebrovascular disease. Medication compliance: yes to doses that are once daily which he takes in the morning. He is prescribed metformin XR 500 mg BID, Farxiga 10 mg QD, and Januvia 100 mg QD; he often misses his evening dose of Metformin as he is not good at remembering evening medications. Failed treatment with most recently prescribed Trulicity due to GI side effects and shortness of breath. Current diet: in general, a "healthy" diet  . Current exercise: none. Home blood sugar records: patient does  check sugars. Is he  on ACE inhibitor or angiotensin II receptor blocker? Yes (Valsartan). Is he on a statin? Yes (Atorvastatin).  ° °Atrial Fibrillation/History of PE: taking Xarelto. ° °CHF: managed by cardiology, who he last saw on 10/14/2020. ° °CKD: taking Farxiga.  ° °Sleep apnea: noncompliant with CPAP so he no longer has one. ° °Gout: uric  acid level 8.1 on 04/29/2021 with labs completed at [facility]. Patient reports he has regular flares of gout and has never been on anything to treat them. ° °Vitamin D deficiency: 13.94 on 04/29/2021 with labs completed at [facility]. He is unable to afford OTC supplements.  ° °Chronic pain: managed by [facility] who he last saw earlier this month. ° °New complaints: °Patient would like his testosterone level checked due to chronic fatigue.  ° ° °Social history: ° °Relevant past medical, surgical, family and social history reviewed and updated as indicated. Interim medical history since our last visit reviewed. ° °Allergies and medications reviewed and updated. ° °DATA REVIEWED: CHART IN EPIC ° °ROS: Negative unless specifically indicated above in HPI.  ° ° °Current Outpatient Medications:  °  atorvastatin (LIPITOR) 10 MG tablet, Take 1 tablet (10 mg total) by mouth daily., Disp: 90 tablet, Rfl: 1 °  chlorthalidone (HYGROTON) 25 MG tablet, TAKE ONE TABLET BY MOUTH EVERY DAY, Disp: 30 tablet, Rfl: 6 °  dapagliflozin propanediol (FARXIGA) 10 MG TABS tablet, Take 1 tablet (10 mg total) by mouth daily before breakfast., Disp: 90 tablet, Rfl: 1 °  gabapentin (NEURONTIN) 300 MG capsule, Take 1 capsule by mouth daily as needed (pain)., Disp: , Rfl:  °  glucose blood (ONETOUCH VERIO) test strip, USE TO TEST BLOOD SUGAR DAILY AS DIRECTED. DX: 11.9, Disp: 100 each, Rfl: 12 °  hydrALAZINE (APRESOLINE) 50 MG tablet, Take 1 tablet (50 mg total) by mouth 3 (three) times daily. Patient is due for follow up please call our office to schedule an appointment to ensure further refills, Disp: 90 tablet, Rfl: 0 °  metFORMIN (GLUCOPHAGE XR) 500 MG 24 hr tablet, Take 1 tablet (500 mg total) by mouth 2 (two) times daily with a meal., Disp: 60 tablet, Rfl: 3 °  OneTouch Delica Lancets 30G MISC, USE TO TEST BLOOD SUGAR DAILY AS DIRECTED. DX: 11.9, Disp: 100 each, Rfl: 11 °  oxyCODONE-acetaminophen (PERCOCET) 10-325 MG tablet, Take 1 tablet by  mouth 3 (three) times daily as needed for pain., Disp: , Rfl:  °  rivaroxaban (XARELTO) 20 MG TABS tablet, Take 1 tablet (20 mg total) by mouth daily with supper., Disp: 90 tablet, Rfl: 1 °  sitaGLIPtin (JANUVIA) 100 MG tablet, Take 1 tablet (100 mg total) by mouth daily., Disp: 30 tablet, Rfl: 3 °  valsartan (DIOVAN) 320 MG tablet, TAKE ONE TABLET BY MOUTH DAILY, Disp: 30 tablet, Rfl: 6  ° °Allergies  °Allergen Reactions  ° Lisinopril Other (See Comments)  °  wheezing  ° Bystolic [Nebivolol Hcl] Swelling  ° Ibuprofen Other (See Comments)  °  Rectal bleed  ° Spironolactone Hives  ° Trulicity [Dulaglutide]   °  Sob, dry cough, GI  ° Amlodipine Itching and Other (See Comments)  °  Leg pain.  ° Diflucan [Fluconazole] Rash  ° Hctz [Hydrochlorothiazide] Other (See Comments)  °  Caused gout flares.  ° Losartan Rash  ° °Past Medical History:  °Diagnosis Date  ° CAD (coronary artery disease)   ° a. 12/2017: cath showing 10% Proximal-LAD stenosis with no significant obstructive disease and LVEDP mildly elevated   elevated at 18 mm Hg.    Chronic back pain    Diabetes mellitus, type 2 (HCC)    DVT (deep venous thrombosis) (HCC)    Essential hypertension    MI (myocardial infarction) ([place]) 2019   Morbid obesity ([place])    Panic disorder    Shoulder pain, left    Following fall   Sleep apnea    Stroke ([place]) 01/2018   Vitamin D deficiency     Past Surgical History:  Procedure Laterality Date   Ankle fracture sugery Left    BIOPSY  07/20/2020   Procedure: BIOPSY;  Surgeon: Daneil Dolin, MD;  [facility];  Service: Endoscopy;;  cecal polyp   COLONOSCOPY WITH PROPOFOL N/A 05/29/2017   Dr. [name]: Multiple tubular adenomas removed, prep inadequate.  Short interval surveillance colonoscopy recommended in 6 months   COLONOSCOPY WITH PROPOFOL N/A 07/20/2020   Procedure: COLONOSCOPY WITH PROPOFOL;  Surgeon: Daneil Dolin, MD;  [facility];  Service: Endoscopy;  Laterality: N/A;  am appt, early as  possible since diabetic   LEFT HEART CATH AND CORONARY ANGIOGRAPHY N/A 01/23/2018   Procedure: LEFT HEART CATH AND CORONARY ANGIOGRAPHY;  Surgeon: Troy Sine, MD;  Location: [facility];  Service: Cardiovascular;  Laterality: N/A;   LUMBAR SPINE SURGERY     fusion   POLYPECTOMY  05/29/2017   Procedure: POLYPECTOMY;  Surgeon: Daneil Dolin, MD;  [facility];  Service: Endoscopy;;  ascending colon polyp x5-cs transverse colon polyp x4- cs descending colon polyp x1- hs sigmoid colon polyp x1 -hs rectal polyp x1- hs   TONSILLECTOMY      Social History   Socioeconomic History   Marital status: Single    Spouse name: Not on file   Number of children: Not on file   Years of education: Not on file   Highest education level: Not on file  Occupational History   Occupation: disabled  Tobacco Use   Smoking status: Never   Smokeless tobacco: Never  Vaping Use   Vaping Use: Never used  Substance and Sexual Activity   Alcohol use: Yes    Comment: occ   Drug use: Not Currently    Types: Marijuana    Comment: daily; 05/19/20 no marijuana in past 6 months d/t going to pain management clinic   Sexual activity: Not Currently    Birth control/protection: None  Other Topics Concern   Not on file  Social History Narrative   Not on file   Social Determinants of Health   Financial Resource Strain: Not on file  Food Insecurity: Not on file  Transportation Needs: Not on file  Physical Activity: Not on file  Stress: Not on file  Social Connections: Not on file  Intimate Partner Violence: Not on file        Objective:    BP 136/69    Pulse (!) 56    Temp (!) 96.9 F (36.1 C) (Temporal)    Ht 6' 1" (1.854 m)    Wt (!) 396 lb (179.6 kg)    SpO2 94%    BMI 52.25 kg/m   Wt Readings from Last 3 Encounters:  05/20/21 (!) 396 lb (179.6 kg)  02/11/21 (!) 397 lb (180.1 kg)  11/13/20 (!) 400 lb (181.4 kg)    Physical Exam Vitals reviewed.  Constitutional:       General: He is not in acute distress.    Appearance: Normal appearance. He is morbidly  obese. He is not ill-appearing, toxic-appearing or diaphoretic.  HENT:     Head: Normocephalic and atraumatic.  Eyes:     General: No scleral icterus.       Right eye: No discharge.        Left eye: No discharge.     Conjunctiva/sclera: Conjunctivae normal.  Cardiovascular:     Rate and Rhythm: Normal rate and regular rhythm.     Heart sounds: Normal heart sounds. No murmur heard.   No friction rub. No gallop.  Pulmonary:     Effort: Pulmonary effort is normal. No respiratory distress.     Breath sounds: Normal breath sounds. No stridor. No wheezing, rhonchi or rales.  Musculoskeletal:        General: Normal range of motion.     Cervical back: Normal range of motion.  Skin:    General: Skin is warm and dry.  Neurological:     Mental Status: He is alert and oriented to person, place, and time. Mental status is at baseline.  Psychiatric:        Mood and Affect: Mood normal.        Behavior: Behavior normal.        Thought Content: Thought content normal.        Judgment: Judgment normal.    Lab Results  Component Value Date   TSH 2.840 05/13/2019   Lab Results  Component Value Date   WBC 9.3 02/11/2021   HGB 14.9 02/11/2021   HCT 46.4 02/11/2021   MCV 97 02/11/2021   PLT 314 02/11/2021   Lab Results  Component Value Date   NA 138 02/11/2021   K 4.0 02/11/2021   CO2 27 02/11/2021   GLUCOSE 320 (H) 02/11/2021   BUN 18 02/11/2021   CREATININE 1.41 (H) 02/11/2021   BILITOT 0.4 02/11/2021   ALKPHOS 90 02/11/2021   AST 15 02/11/2021   ALT 22 02/11/2021   PROT 6.8 02/11/2021   ALBUMIN 4.0 02/11/2021   CALCIUM 9.5 02/11/2021   ANIONGAP 10 04/10/2019   EGFR 57 (L) 02/11/2021   Lab Results  Component Value Date   CHOL 137 02/11/2021   Lab Results  Component Value Date   HDL 33 (L) 02/11/2021   Lab Results  Component Value Date   LDLCALC 71 02/11/2021   Lab Results   Component Value Date   TRIG 194 (H) 02/11/2021   Lab Results  Component Value Date   CHOLHDL 4.2 02/11/2021   Lab Results  Component Value Date   HGBA1C 9.6 (H) 02/11/2021

## 2021-05-23 ENCOUNTER — Encounter: Payer: Self-pay | Admitting: Family Medicine

## 2021-05-23 DIAGNOSIS — M109 Gout, unspecified: Secondary | ICD-10-CM | POA: Insufficient documentation

## 2021-05-27 DIAGNOSIS — E119 Type 2 diabetes mellitus without complications: Secondary | ICD-10-CM | POA: Diagnosis not present

## 2021-05-27 DIAGNOSIS — M539 Dorsopathy, unspecified: Secondary | ICD-10-CM | POA: Diagnosis not present

## 2021-05-27 DIAGNOSIS — R030 Elevated blood-pressure reading, without diagnosis of hypertension: Secondary | ICD-10-CM | POA: Diagnosis not present

## 2021-05-27 DIAGNOSIS — Z6841 Body Mass Index (BMI) 40.0 and over, adult: Secondary | ICD-10-CM | POA: Diagnosis not present

## 2021-05-27 DIAGNOSIS — Z79899 Other long term (current) drug therapy: Secondary | ICD-10-CM | POA: Diagnosis not present

## 2021-05-27 DIAGNOSIS — N184 Chronic kidney disease, stage 4 (severe): Secondary | ICD-10-CM | POA: Diagnosis not present

## 2021-05-27 DIAGNOSIS — E291 Testicular hypofunction: Secondary | ICD-10-CM | POA: Diagnosis not present

## 2021-05-31 ENCOUNTER — Other Ambulatory Visit: Payer: Self-pay

## 2021-05-31 NOTE — Progress Notes (Signed)
Labs abstracted 

## 2021-06-25 ENCOUNTER — Other Ambulatory Visit: Payer: Self-pay | Admitting: Family Medicine

## 2021-06-28 ENCOUNTER — Other Ambulatory Visit: Payer: Self-pay

## 2021-06-28 DIAGNOSIS — R030 Elevated blood-pressure reading, without diagnosis of hypertension: Secondary | ICD-10-CM | POA: Diagnosis not present

## 2021-06-28 DIAGNOSIS — E291 Testicular hypofunction: Secondary | ICD-10-CM | POA: Diagnosis not present

## 2021-06-28 DIAGNOSIS — Z79899 Other long term (current) drug therapy: Secondary | ICD-10-CM | POA: Diagnosis not present

## 2021-06-28 DIAGNOSIS — M539 Dorsopathy, unspecified: Secondary | ICD-10-CM | POA: Diagnosis not present

## 2021-06-28 DIAGNOSIS — E119 Type 2 diabetes mellitus without complications: Secondary | ICD-10-CM | POA: Diagnosis not present

## 2021-06-28 DIAGNOSIS — Z6841 Body Mass Index (BMI) 40.0 and over, adult: Secondary | ICD-10-CM | POA: Diagnosis not present

## 2021-06-28 DIAGNOSIS — N184 Chronic kidney disease, stage 4 (severe): Secondary | ICD-10-CM | POA: Diagnosis not present

## 2021-06-28 DIAGNOSIS — E2740 Unspecified adrenocortical insufficiency: Secondary | ICD-10-CM | POA: Diagnosis not present

## 2021-07-06 ENCOUNTER — Other Ambulatory Visit: Payer: Self-pay | Admitting: Cardiovascular Disease

## 2021-07-06 NOTE — Telephone Encounter (Signed)
Rx(s) sent to pharmacy electronically.  

## 2021-07-15 NOTE — Progress Notes (Incomplete)
? ?Cardiology Office Note:   ? ?Date:  07/15/2021  ? ?ID:  Bradley Torres, DOB 11/07/1960, MRN [id number] ? ?PCP:  [name], [name]  ?Cardiologist:  Skeet Latch, MD  ?Nephrologist: ? ?Referring MD: [name], [name]  ? ?CC: Hypertension ? ?History of Present Illness:   ? ?Bradley Torres is a 61 y.o. male with a hx of atrial fibrillation, hypertension, hyperlipidemia, diabetes, pulmonary hypertension, PE, OSA, minimal coronary calcification, morbid obesity and anxiety here for follow-up.  He initially established care in the advanced hypertension clinic 12/2019.  He was first diagnosed with hypertension in his late 2s and has consistently struggled to control it.  He was taking metoprolol for palpitations.  His PCP saw him 11/2019 and recommended adding hydralazine to his regimen but he was unwilling to take anything more than once per day.  He saw Katina Dung, NP, on 12/2019 and his blood pressure was 164/92.  It was recommended that he increase his metoprolol to 200 mg but he was reluctant, despite the fact that his heart rate was 95.  He was referred to advanced hypertension clinic.  He reported fatigue was recommended that he switch metoprolol to nebivolol. ? ?Bradley Torres reports having heart attacks, though is cath in 2019 revealed 10% LAD disease.  He does have known sleep apnea and has a machine.  However he is in the process of getting a new mask as his currently does not work.  It was noted that he was unable to cook for himself because he could not stand long.  His brother's restaurant food, though the salt content is high.  Most of his meats are fried and he drinks 2000 cal of sugary beverages and caffeine.  His blood pressures remain poorly controlled and he called our office.  Doxazosin was added to his regimen.  He notes that both his blood pressure and blood sugars were better controlled when he was able to use marijuana.  He started smoking at the age of 73.  This helped with his anxiety  and anger issues.  However he is no longer able to use this because of being on a pain management contract. ?  ?Bradley Torres has feeling fair.  He has light headaches off and on for "a while."  He is unable to tell how long it has been doing on.  He is struggling with sleep and anxiety.  He only gets anxious in the winter. He hasn't been getting any exercise other than ADLs.  He is mostly staying at home and not getting out. He has noted some pain in his R leg.  He was wondering if it was a blood clot.  There was no edema and has since improved. There was no calf pain and was mostly anterior.  His BP was 186/103 at home.  He doesn't check it regularly.  He checks it on his forearm and isn't sure if it is accurate.  He struggles with anxiety that is worse in the winter.  He continues ot have some sores on his lower legs. He has no orthopnea or PND.  He is mostly home and doesn't get out much when its cold outside.  He has no orthopnea or PND. His weight is up but he isn't sure why.  There haven't been any changes in his diet.  He notes that his ability to read is limited.  ? ?*** ? ?Previous antihypertensives: ?Thiazide- gout ?CCB ?ARB ?ACE-I- cough, wheezing ?Spironolactone- rash ? ?Past Medical  History:  ?Diagnosis Date  ? CAD (coronary artery disease)   ? a. 12/2017: cath showing 10% Proximal-LAD stenosis with no significant obstructive disease and LVEDP mildly elevated at 18 mm Hg.   ? Chronic back pain   ? Diabetes mellitus, type 2 ([place])   ? DVT (deep venous thrombosis) ([place])   ? Essential hypertension   ? MI (myocardial infarction) ([place]) 2019  ? Morbid obesity ([place])   ? Panic disorder   ? Shoulder pain, left   ? Following fall  ? Sleep apnea   ? Stroke [facility]) 01/2018  ? Vitamin D deficiency   ? ? ?Past Surgical History:  ?Procedure Laterality Date  ? Ankle fracture sugery Left   ? BIOPSY  07/20/2020  ? Procedure: BIOPSY;  Surgeon: Daneil Dolin, MD;  [facility];  Service: Endoscopy;;  cecal polyp  ?  COLONOSCOPY WITH PROPOFOL N/A 05/29/2017  ? Dr. [name]: Multiple tubular adenomas removed, prep inadequate.  Short interval surveillance colonoscopy recommended in 6 months  ? COLONOSCOPY WITH PROPOFOL N/A 07/20/2020  ? Procedure: COLONOSCOPY WITH PROPOFOL;  Surgeon: Daneil Dolin, MD;  [facility];  Service: Endoscopy;  Laterality: N/A;  am appt, early as possible since diabetic  ? LEFT HEART CATH AND CORONARY ANGIOGRAPHY N/A 01/23/2018  ? Procedure: LEFT HEART CATH AND CORONARY ANGIOGRAPHY;  Surgeon: Troy Sine, MD;  Location: [facility];  Service: Cardiovascular;  Laterality: N/A;  ? LUMBAR SPINE SURGERY    ? fusion  ? POLYPECTOMY  05/29/2017  ? Procedure: POLYPECTOMY;  Surgeon: Daneil Dolin, MD;  [facility];  Service: Endoscopy;;  ascending colon polyp x5-cs ?transverse colon polyp x4- cs ?descending colon polyp x1- hs ?sigmoid colon polyp x1 -hs ?rectal polyp x1- hs  ? TONSILLECTOMY    ? ? ?Current Medications: ?No outpatient medications have been marked as taking for the 07/19/21 encounter (Appointment) with Skeet Latch, MD.  ?  ? ?Allergies:   Lisinopril, Bystolic [nebivolol hcl], Ibuprofen, Spironolactone, Trulicity [dulaglutide], Amlodipine, Diflucan [fluconazole], Hctz [hydrochlorothiazide], and Losartan  ? ?Social History  ? ?Socioeconomic History  ? Marital status: Single  ?  Spouse name: Not on file  ? Number of children: Not on file  ? Years of education: Not on file  ? Highest education level: Not on file  ?Occupational History  ? Occupation: disabled  ?Tobacco Use  ? Smoking status: Never  ? Smokeless tobacco: Never  ?Vaping Use  ? Vaping Use: Never used  ?Substance and Sexual Activity  ? Alcohol use: Yes  ?  Comment: occ  ? Drug use: Not Currently  ?  Types: Marijuana  ?  Comment: daily; 05/19/20 no marijuana in past 6 months d/t going to pain management clinic  ? Sexual activity: Not Currently  ?  Birth control/protection: None  ?Other Topics Concern  ? Not  on file  ?Social History Narrative  ? Not on file  ? ?Social Determinants of Health  ? ?Financial Resource Strain: Not on file  ?Food Insecurity: Not on file  ?Transportation Needs: Not on file  ?Physical Activity: Not on file  ?Stress: Not on file  ?Social Connections: Not on file  ?  ? ?Family History: ?The patient's family history includes Colon cancer in his mother; Heart attack in his father and paternal grandfather; Hepatitis (age of onset: 9) in his sister. ? ?ROS:   ?Please see the history of present illness.    ?All other systems reviewed and are  negative. ? ?EKGs/Labs/Other Studies Reviewed:   ? ?EKG:  EKG is not ordered today.   ? ?Echo 05/21/19 ?1. Left ventricular ejection fraction, by estimation, is 60 to 65%. The  ?left ventricle has normal function. The left ventricle has no regional  ?wall motion abnormalities. There is moderate concentric left ventricular  ?hypertrophy. Left ventricular  ?diastolic parameters were normal.  ? 2. Right ventricular systolic function is normal. The right ventricular  ?size is normal.  ? 3. The mitral valve is grossly normal. Trivial mitral valve  ?regurgitation.  ? 4. The aortic valve is tricuspid. Aortic valve regurgitation is not  ?visualized. No aortic stenosis is present.  ? 5. The inferior vena cava is normal in size with greater than 50%  ?respiratory variability, suggesting right atrial pressure of 3 mmHg.  ? ?Echocardiogram: 01/23/2018 ?Study Conclusions ?  ?- Left ventricle: The cavity size was normal. Systolic function was ?  normal. The estimated ejection fraction was in the range of 60% ?  to 65%. Wall motion was normal; there were no regional wall ?  motion abnormalities. The study was not technically sufficient to ?  allow evaluation of LV diastolic dysfunction due to atrial ?  fibrillation. ?- Aortic valve: Trileaflet; normal thickness, mildly calcified ?  leaflets. Valve area (VTI): 2.65 cm^2. Valve area (Vmean): 3.21 ?  cm^2. ?- Mitral valve: Calcified  annulus. ?- Right ventricle: The cavity size was moderately dilated. Wall ?  thickness was normal. ?- Right atrium: The atrium was mildly dilated. ?- Tricuspid valve: There was moderate regurgitation.

## 2021-07-19 ENCOUNTER — Ambulatory Visit (HOSPITAL_BASED_OUTPATIENT_CLINIC_OR_DEPARTMENT_OTHER): Payer: Medicaid Other | Admitting: Cardiovascular Disease

## 2021-07-26 DIAGNOSIS — M539 Dorsopathy, unspecified: Secondary | ICD-10-CM | POA: Diagnosis not present

## 2021-07-26 DIAGNOSIS — Z6841 Body Mass Index (BMI) 40.0 and over, adult: Secondary | ICD-10-CM | POA: Diagnosis not present

## 2021-07-26 DIAGNOSIS — R030 Elevated blood-pressure reading, without diagnosis of hypertension: Secondary | ICD-10-CM | POA: Diagnosis not present

## 2021-07-26 DIAGNOSIS — E291 Testicular hypofunction: Secondary | ICD-10-CM | POA: Diagnosis not present

## 2021-07-26 DIAGNOSIS — E2740 Unspecified adrenocortical insufficiency: Secondary | ICD-10-CM | POA: Diagnosis not present

## 2021-07-26 DIAGNOSIS — E119 Type 2 diabetes mellitus without complications: Secondary | ICD-10-CM | POA: Diagnosis not present

## 2021-07-26 DIAGNOSIS — Z79899 Other long term (current) drug therapy: Secondary | ICD-10-CM | POA: Diagnosis not present

## 2021-07-26 DIAGNOSIS — N184 Chronic kidney disease, stage 4 (severe): Secondary | ICD-10-CM | POA: Diagnosis not present

## 2021-07-27 ENCOUNTER — Other Ambulatory Visit: Payer: Self-pay | Admitting: Family Medicine

## 2021-07-27 DIAGNOSIS — N1831 Chronic kidney disease, stage 3a: Secondary | ICD-10-CM

## 2021-07-27 DIAGNOSIS — M109 Gout, unspecified: Secondary | ICD-10-CM

## 2021-07-27 DIAGNOSIS — E1165 Type 2 diabetes mellitus with hyperglycemia: Secondary | ICD-10-CM

## 2021-07-27 DIAGNOSIS — Z86711 Personal history of pulmonary embolism: Secondary | ICD-10-CM

## 2021-08-05 ENCOUNTER — Ambulatory Visit (HOSPITAL_BASED_OUTPATIENT_CLINIC_OR_DEPARTMENT_OTHER): Payer: Medicaid Other | Admitting: Cardiovascular Disease

## 2021-08-05 ENCOUNTER — Encounter (HOSPITAL_BASED_OUTPATIENT_CLINIC_OR_DEPARTMENT_OTHER): Payer: Self-pay | Admitting: Cardiovascular Disease

## 2021-08-05 VITALS — BP 110/64 | HR 99 | Ht 73.0 in | Wt 391.0 lb

## 2021-08-05 DIAGNOSIS — E1165 Type 2 diabetes mellitus with hyperglycemia: Secondary | ICD-10-CM | POA: Diagnosis not present

## 2021-08-05 DIAGNOSIS — I252 Old myocardial infarction: Secondary | ICD-10-CM

## 2021-08-05 DIAGNOSIS — I5032 Chronic diastolic (congestive) heart failure: Secondary | ICD-10-CM

## 2021-08-05 DIAGNOSIS — I480 Paroxysmal atrial fibrillation: Secondary | ICD-10-CM

## 2021-08-05 DIAGNOSIS — I152 Hypertension secondary to endocrine disorders: Secondary | ICD-10-CM

## 2021-08-05 DIAGNOSIS — E1159 Type 2 diabetes mellitus with other circulatory complications: Secondary | ICD-10-CM | POA: Diagnosis not present

## 2021-08-05 DIAGNOSIS — G4733 Obstructive sleep apnea (adult) (pediatric): Secondary | ICD-10-CM

## 2021-08-05 MED ORDER — ATORVASTATIN CALCIUM 20 MG PO TABS: 20.0000 mg | ORAL_TABLET | Freq: Every day | ORAL | 3 refills | Status: DC

## 2021-08-05 NOTE — Progress Notes (Signed)
? ?Hypertension Clinic Follow up Assessment:   ? ?Date:  08/05/2021  ? ?ID:  [name], DOB [birth year], MRN [id number] ? ?PCP:  [name], [name]  ?Cardiologist:  Skeet Latch, MD  ?Nephrologist: ? ?Referring MD: [name], [name]  ? ?CC: Hypertension ? ?History of Present Illness:   ? ?Bradley Torres is a 61 y.o. male with a hx of atrial fibrillation, hypertension, hyperlipidemia, diabetes, pulmonary hypertension, PE, OSA, minimal coronary calcification, morbid obesity and anxiety here for follow-up.  He initially established care in the advanced hypertension clinic 12/2019.  He was first diagnosed with hypertension in his late 59s and has consistently struggled to control it.  He was taking metoprolol for palpitations.  His PCP saw him 11/2019 and recommended adding hydralazine to his regimen but he was unwilling to take anything more than once per day.  He saw Katina Dung, NP, on 12/2019 and his blood pressure was 164/92.  It was recommended that he increase his metoprolol to 200 mg but he was reluctant, despite the fact that his heart rate was 95.  He was referred to advanced hypertension clinic.  He reported fatigue was recommended that he switch metoprolol to nebivolol. ? ?Mr. Mckim reports having heart attacks, though is cath in 2019 revealed 10% LAD disease.  He does have known sleep apnea and has a machine.  However he is in the process of getting a new mask as his currently does not work.  It was noted that he was unable to cook for himself because he could not stand long.  His brother's restaurant food, though the salt content is high.  Most of his meats are fried and he drinks 2000 cal of sugary beverages and caffeine.  His blood pressures remain poorly controlled and he called our office.  Doxazosin was added to his regimen.  He notes that both his blood pressure and blood sugars were better controlled when he was able to use marijuana.  He started smoking at the age of 72.  This helped  with his anxiety and anger issues.  However he is no longer able to use this because of being on a pain management contract. ?  ?At his visit 01/2020 nebivolol and doxazosin were increased.  He followed up with Coletta Memos, NP on 06/2020 and his blood pressure was 124/76.  He started splitting his dose of chlorthalidone due to dizziness but was otherwise well at his follow-up 09/2020.  Lately he does that he has been feeling pretty well.  He is done some physical therapy and they noted that his blood pressure drops when he stands.  He has some lightheadedness when he first stands.  He denies any syncope.  He has no lower extremity edema, orthopnea, or PND. ? ?Previous antihypertensives: ?Thiazide- gout ?CCB ?ARB ?ACE-I- cough, wheezing ?Spironolactone- rash ? ?Past Medical History:  ?Diagnosis Date  ? CAD (coronary artery disease)   ? a. 12/2017: cath showing 10% Proximal-LAD stenosis with no significant obstructive disease and LVEDP mildly elevated at 18 mm Hg.   ? Chronic back pain   ? Diabetes mellitus, type 2 ([place])   ? DVT (deep venous thrombosis) ([place])   ? Essential hypertension   ? MI (myocardial infarction) ([place]) 2019  ? Morbid obesity ([place])   ? Panic disorder   ? Shoulder pain, left   ? Following fall  ? Sleep apnea   ? Stroke [facility]) 01/2018  ? Vitamin D deficiency   ? ? ?Past Surgical  History:  ?Procedure Laterality Date  ? Ankle fracture sugery Left   ? BIOPSY  07/20/2020  ? Procedure: BIOPSY;  Surgeon: Daneil Dolin, MD;  [facility];  Service: Endoscopy;;  cecal polyp  ? COLONOSCOPY WITH PROPOFOL N/A 05/29/2017  ? Dr. [name]: Multiple tubular adenomas removed, prep inadequate.  Short interval surveillance colonoscopy recommended in 6 months  ? COLONOSCOPY WITH PROPOFOL N/A 07/20/2020  ? Procedure: COLONOSCOPY WITH PROPOFOL;  Surgeon: Daneil Dolin, MD;  [facility];  Service: Endoscopy;  Laterality: N/A;  am appt, early as possible since diabetic  ? LEFT HEART CATH AND CORONARY  ANGIOGRAPHY N/A 01/23/2018  ? Procedure: LEFT HEART CATH AND CORONARY ANGIOGRAPHY;  Surgeon: Troy Sine, MD;  Location: [place] [facility] LAB;  Service: Cardiovascular;  Laterality: N/A;  ? LUMBAR SPINE SURGERY    ? fusion  ? POLYPECTOMY  05/29/2017  ? Procedure: POLYPECTOMY;  Surgeon: Daneil Dolin, MD;  [facility];  Service: Endoscopy;;  ascending colon polyp x5-cs ?transverse colon polyp x4- cs ?descending colon polyp x1- hs ?sigmoid colon polyp x1 -hs ?rectal polyp x1- hs  ? TONSILLECTOMY    ? ? ?Current Medications: ?Current Meds  ?Medication Sig  ? allopurinol (ZYLOPRIM) 100 MG tablet TAKE ONE TABLET BY MOUTH ONCE DAILY  ? chlorthalidone (HYGROTON) 25 MG tablet Take 25 mg by mouth as directed. TAKE 1/2 TABLET DAILY  ? FARXIGA 10 MG TABS tablet TAKE ONE TABLET BY MOUTH DAILY BEFORE BREAKFAST  ? gabapentin (NEURONTIN) 300 MG capsule Take 1 capsule by mouth daily as needed (pain).  ? glucose blood (ONETOUCH VERIO) test strip USE TO TEST BLOOD SUGAR DAILY AS DIRECTED. DX: 11.9  ? hydrALAZINE (APRESOLINE) 50 MG tablet TAKE ONE TABLET BY MOUTH THREE TIMES DAILY *PLEASE SCHEDULE APPOINTMENT FOR FURTHER REFILLS*  ? JANUVIA 100 MG tablet TAKE ONE TABLET BY MOUTH DAILY  ? metFORMIN (GLUCOPHAGE-XR) 500 MG 24 hr tablet Take 500 mg by mouth daily with breakfast.  ? OneTouch Delica Lancets 19F MISC USE TO TEST BLOOD SUGAR DAILY AS DIRECTED. DX: 11.9  ? oxyCODONE-acetaminophen (PERCOCET) 10-325 MG tablet Take 1 tablet by mouth 3 (three) times daily as needed for pain.  ? testosterone cypionate (DEPOTESTOSTERONE CYPIONATE) 200 MG/ML injection Inject 200 mg into the muscle every 14 (fourteen) days.  ? valsartan (DIOVAN) 320 MG tablet TAKE ONE TABLET BY MOUTH DAILY  ? Vitamin D, Ergocalciferol, (DRISDOL) 1.25 MG (50000 UNIT) CAPS capsule Take 1 capsule (50,000 Units total) by mouth every 7 (seven) days.  ? XARELTO 20 MG TABS tablet TAKE ONE TABLET BY MOUTH DAILY WITH SUPPER  ? [DISCONTINUED] atorvastatin  (LIPITOR) 10 MG tablet Take 1 tablet (10 mg total) by mouth daily.  ? [DISCONTINUED] chlorthalidone (HYGROTON) 25 MG tablet TAKE ONE TABLET BY MOUTH DAILY (Patient taking differently: Take 25 mg by mouth as directed. TAKE 1/2 TABLET DAILY)  ?  ? ?Allergies:   Lisinopril, Bystolic [nebivolol hcl], Ibuprofen, Spironolactone, Trulicity [dulaglutide], Amlodipine, Diflucan [fluconazole], Hctz [hydrochlorothiazide], and Losartan  ? ?Social History  ? ?Socioeconomic History  ? Marital status: Single  ?  Spouse name: Not on file  ? Number of children: Not on file  ? Years of education: Not on file  ? Highest education level: Not on file  ?Occupational History  ? Occupation: disabled  ?Tobacco Use  ? Smoking status: Never  ? Smokeless tobacco: Never  ?Vaping Use  ? Vaping Use: Never used  ?Substance and Sexual Activity  ?  Alcohol use: Yes  ?  Comment: occ  ? Drug use: Not Currently  ?  Types: Marijuana  ?  Comment: daily; 05/19/20 no marijuana in past 6 months d/t going to pain management clinic  ? Sexual activity: Not Currently  ?  Birth control/protection: None  ?Other Topics Concern  ? Not on file  ?Social History Narrative  ? Not on file  ? ?Social Determinants of Health  ? ?Financial Resource Strain: Not on file  ?Food Insecurity: Not on file  ?Transportation Needs: Not on file  ?Physical Activity: Not on file  ?Stress: Not on file  ?Social Connections: Not on file  ?  ? ?Family History: ?The patient's family history includes Colon cancer in his mother; Heart attack in his father and paternal grandfather; Hepatitis (age of onset: 92) in his sister. ? ?ROS:   ?Please see the history of present illness.    ?All other systems reviewed and are negative. ? ?EKGs/Labs/Other Studies Reviewed:   ? ?EKG:  EKG is ordered today.   ?08/05/2020: Sinus rhythm.  PACs.  Rate 99 bpm. ? ? ?Echocardiogram: 01/23/2018 ?Study Conclusions ?  ?- Left ventricle: The cavity size was normal. Systolic function was ?  normal. The estimated ejection  fraction was in the range of 60% ?  to 65%. Wall motion was normal; there were no regional wall ?  motion abnormalities. The study was not technically sufficient to ?  allow evaluation of LV diastolic dysfunction

## 2021-08-05 NOTE — Patient Instructions (Signed)
Medication Instructions:  ?INCREASE YOUR ATORVASTATIN TO 20 MG DAILY  ? ?DECREASE YOUR CHLORTHALIDONE TO 25 MG 1/2 TABLET DAILY  ? ?*If you need a refill on your cardiac medications before your next appointment, please call your pharmacy* ? ?Lab Work: ?NONE ? ?Testing/Procedures: ?NONE  ? ?Follow-Up: ?THE OFFICE WILL CALL YOU TO SCHEDULE FOLLOW UP IN ABOUT 2 MONTHS  ? ? ? ? ? ? ?

## 2021-08-18 ENCOUNTER — Encounter: Payer: Self-pay | Admitting: Family Medicine

## 2021-08-18 ENCOUNTER — Ambulatory Visit: Payer: Medicaid Other | Admitting: Family Medicine

## 2021-08-18 VITALS — BP 121/78 | HR 84 | Temp 96.0°F | Ht 73.0 in | Wt 385.4 lb

## 2021-08-18 DIAGNOSIS — M791 Myalgia, unspecified site: Secondary | ICD-10-CM

## 2021-08-18 DIAGNOSIS — E1165 Type 2 diabetes mellitus with hyperglycemia: Secondary | ICD-10-CM

## 2021-08-18 DIAGNOSIS — N1831 Chronic kidney disease, stage 3a: Secondary | ICD-10-CM

## 2021-08-18 DIAGNOSIS — I480 Paroxysmal atrial fibrillation: Secondary | ICD-10-CM

## 2021-08-18 DIAGNOSIS — I5032 Chronic diastolic (congestive) heart failure: Secondary | ICD-10-CM

## 2021-08-18 DIAGNOSIS — E1159 Type 2 diabetes mellitus with other circulatory complications: Secondary | ICD-10-CM | POA: Diagnosis not present

## 2021-08-18 DIAGNOSIS — E559 Vitamin D deficiency, unspecified: Secondary | ICD-10-CM

## 2021-08-18 DIAGNOSIS — G4733 Obstructive sleep apnea (adult) (pediatric): Secondary | ICD-10-CM

## 2021-08-18 DIAGNOSIS — M109 Gout, unspecified: Secondary | ICD-10-CM

## 2021-08-18 DIAGNOSIS — I152 Hypertension secondary to endocrine disorders: Secondary | ICD-10-CM | POA: Diagnosis not present

## 2021-08-18 LAB — BAYER DCA HB A1C WAIVED: HB A1C (BAYER DCA - WAIVED): 10.1 % — ABNORMAL HIGH (ref 4.8–5.6)

## 2021-08-18 MED ORDER — SITAGLIPTIN PHOSPHATE 100 MG PO TABS: 100.0000 mg | ORAL_TABLET | Freq: Every day | ORAL | 1 refills | Status: DC

## 2021-08-18 MED ORDER — ALLOPURINOL 100 MG PO TABS: 100.0000 mg | ORAL_TABLET | Freq: Every day | ORAL | 1 refills | Status: DC

## 2021-08-18 MED ORDER — METHOCARBAMOL 500 MG PO TABS
500.0000 mg | ORAL_TABLET | Freq: Three times a day (TID) | ORAL | 1 refills | Status: DC | PRN
Start: 2021-08-18 — End: 2022-06-19

## 2021-08-18 NOTE — Progress Notes (Signed)
Assessment & Plan:  1. Type 2 diabetes mellitus with hyperglycemia, without long-term current use of insulin (HCC) Lab Results  Component Value Date   HGBA1C 10.1 (H) 08/18/2021   HGBA1C 9.0 04/29/2021   HGBA1C 9.6 (H) 02/11/2021    - Diabetes is not at goal of A1c < 7. Referring to endocrinology as patient's A1c has continued to increase despite any treatment added to his regimen. - Medications:  Encouraged to take medications as prescribed - Patient is currently taking a statin. Patient is taking an ACE-inhibitor/ARB.  - Instruction/counseling given: discussed the need for weight loss  Diabetes Health Maintenance Due  Topic Date Due   OPHTHALMOLOGY EXAM  02/12/2022   HEMOGLOBIN A1C  02/18/2022   FOOT EXAM  08/19/2022    Lab Results  Component Value Date   LABMICR 40.0 05/08/2018   - Lipid panel - CBC with Differential/Platelet - CMP14+EGFR - Bayer DCA Hb A1c Waived - Vitamin B12 - sitaGLIPtin (JANUVIA) 100 MG tablet; Take 1 tablet (100 mg total) by mouth daily.  Dispense: 90 tablet; Refill: 1 - Ambulatory referral to Endocrinology  2. Stage 3a chronic kidney disease ([place]) Continue Farxiga. - CMP14+EGFR  3. Morbid (severe) obesity due to excess calories ([place]) Encouraged healthy eating and exercise. - Lipid panel - CBC with Differential/Platelet - CMP14+EGFR  4. Hypertension associated with diabetes ([place]) Well controlled on current regimen.  Congratulated on this, as I know this is been a long road getting this under control. - Lipid panel - CBC with Differential/Platelet - CMP14+EGFR  5. Chronic diastolic CHF (congestive heart failure) ([place]) Managed by cardiology. - Lipid panel - CBC with Differential/Platelet - CMP14+EGFR  6. Paroxysmal atrial fibrillation (HCC) Well controlled on current regimen. Managed by cardiology. - CBC with Differential/Platelet - CMP14+EGFR  7. Vitamin D deficiency - Vitamin D, 25-hydroxy  8. Gout without tophus Well  controlled on current regimen.  - CMP14+EGFR - allopurinol (ZYLOPRIM) 100 MG tablet; Take 1 tablet (100 mg total) by mouth daily.  Dispense: 90 tablet; Refill: 1 - Cholecalciferol (VITAMIN D3) 250 MCG (10000 UT) capsule; Take 10,000 Units by mouth daily.  9. Severe obstructive sleep apnea-hypopnea syndrome Unagreeable to treat.  10. Muscle pain - methocarbamol (ROBAXIN) 500 MG tablet; Take 1 tablet (500 mg total) by mouth every 8 (eight) hours as needed for muscle spasms.  Dispense: 60 tablet; Refill: 1   Return in about 6 months (around 02/18/2022) for annual physical.  Bradley Limes, MSN, APRN, [name]-C [facility]  Subjective:    Patient ID: Bradley Torres, male    DOB: 22-Jun-1960, 61 y.o.   MRN: [id number]  Patient Care Team: Bradley Brooklyn, [name] as PCP - General (Family Medicine) Bradley Latch, MD as PCP - Cardiology (Cardiology)   Chief Complaint:  Chief Complaint  Patient presents with   Medical Management of Chronic Issues   Flank Pain    Right x 1 week. Thinks he pulled a muscle when he coughed.     HPI: Bradley Torres is a 61 y.o. male presenting on 08/18/2021 for Medical Management of Chronic Issues and Flank Pain (Right x 1 week. Thinks he pulled a muscle when he coughed. )  Hypertension: managed by the hypertension clinic, who he last saw in May 2023.  Diabetes: Patient presents for follow up of diabetes. Current symptoms include: hyperglycemia. Known diabetic complications: cardiovascular disease and cerebrovascular disease. Medication compliance: yes - he was advised three months again to increase his morning metformin from 500  to 1000 mg and continue Farxiga 10 mg QD, and Januvia 100 mg QD; however he did not remember this so he is only been taking 500 mg of metformin in the morning. Failed treatment with most recently prescribed Trulicity due to GI side effects and shortness of breath. Current diet: in general, a "healthy" diet  . Current  exercise: none. Home blood sugar records: patient does not check sugars. Is he  on ACE inhibitor or angiotensin II receptor blocker? Yes (Valsartan). Is he on a statin? Yes (Atorvastatin).   Atrial Fibrillation/History of PE: taking Xarelto.  CHF: managed by cardiology, who he last saw on 10/14/2020.  CKD: taking [place].   Sleep apnea: noncompliant with CPAP so he no longer has one.  Gout: uric acid level 8.1 on 04/29/2021 with labs completed at [facility]. Previously having regular flares of gout and was therefore started on allopurinol 100 mg daily.  He denies any flares since starting the allopurinol.  Vitamin D deficiency: 13.94 on 04/29/2021 with labs completed at [facility]. He was previously unable to afford OTC supplements and was started on a weekly supplement in February 2023 which he reports he took for one month and then purchased OTC vitamin D3 10,000 units daily.   Chronic pain: managed by [facility] who he last saw earlier this month.  New complaints: Patient reports right side pain x1 week.  He believes he did this coughing.   Social history:  Relevant past medical, surgical, family and social history reviewed and updated as indicated. Interim medical history since our last visit reviewed.  Allergies and medications reviewed and updated.  DATA REVIEWED: CHART IN EPIC  ROS: Negative unless specifically indicated above in HPI.    Current Outpatient Medications:    allopurinol (ZYLOPRIM) 100 MG tablet, TAKE ONE TABLET BY MOUTH ONCE DAILY, Disp: 30 tablet, Rfl: 0   atorvastatin (LIPITOR) 20 MG tablet, Take 1 tablet (20 mg total) by mouth daily., Disp: 90 tablet, Rfl: 3   chlorthalidone (HYGROTON) 25 MG tablet, Take 25 mg by mouth as directed. TAKE 1/2 TABLET DAILY, Disp: , Rfl:    Cholecalciferol (VITAMIN D3) 250 MCG (10000 UT) capsule, Take 10,000 Units by mouth daily., Disp: , Rfl:    FARXIGA 10 MG TABS tablet, TAKE ONE TABLET BY MOUTH DAILY BEFORE BREAKFAST, Disp: 90  tablet, Rfl: 0   gabapentin (NEURONTIN) 300 MG capsule, Take 1 capsule by mouth daily as needed (pain)., Disp: , Rfl:    glucose blood (ONETOUCH VERIO) test strip, USE TO TEST BLOOD SUGAR DAILY AS DIRECTED. DX: 11.9, Disp: 100 each, Rfl: 12   hydrALAZINE (APRESOLINE) 50 MG tablet, TAKE ONE TABLET BY MOUTH THREE TIMES DAILY *PLEASE SCHEDULE APPOINTMENT FOR FURTHER REFILLS*, Disp: 90 tablet, Rfl: 0   JANUVIA 100 MG tablet, TAKE ONE TABLET BY MOUTH DAILY, Disp: 30 tablet, Rfl: 0   metFORMIN (GLUCOPHAGE-XR) 500 MG 24 hr tablet, Take 500 mg by mouth daily with breakfast., Disp: , Rfl:    OneTouch Delica Lancets 13K MISC, USE TO TEST BLOOD SUGAR DAILY AS DIRECTED. DX: 11.9, Disp: 100 each, Rfl: 11   oxyCODONE-acetaminophen (PERCOCET) 10-325 MG tablet, Take 1 tablet by mouth 3 (three) times daily as needed for pain., Disp: , Rfl:    testosterone cypionate (DEPOTESTOSTERONE CYPIONATE) 200 MG/ML injection, Inject 200 mg into the muscle every 14 (fourteen) days., Disp: , Rfl:    valsartan (DIOVAN) 320 MG tablet, TAKE ONE TABLET BY MOUTH DAILY, Disp: 30 tablet, Rfl: 6   XARELTO 20 MG  TABS tablet, TAKE ONE TABLET BY MOUTH DAILY WITH SUPPER, Disp: 90 tablet, Rfl: 0   Allergies  Allergen Reactions   Lisinopril Other (See Comments)    wheezing   Bystolic [Nebivolol Hcl] Swelling   Ibuprofen Other (See Comments)    Rectal bleed   Spironolactone Hives   Trulicity [Dulaglutide]     Sob, dry cough, GI   Amlodipine Itching and Other (See Comments)    Leg pain.   Diflucan [Fluconazole] Rash   Hctz [Hydrochlorothiazide] Other (See Comments)    Caused gout flares.   Losartan Rash   Past Medical History:  Diagnosis Date   CAD (coronary artery disease)    a. 12/2017: cath showing 10% Proximal-LAD stenosis with no significant obstructive disease and LVEDP mildly elevated at 18 mm Hg.    Chronic back pain    Diabetes mellitus, type 2 (HCC)    DVT (deep venous thrombosis) (HCC)    Essential hypertension     MI (myocardial infarction) ([place]) 2019   Morbid obesity ([place])    Panic disorder    Shoulder pain, left    Following fall   Sleep apnea    Stroke ([place]) 01/2018   Vitamin D deficiency     Past Surgical History:  Procedure Laterality Date   Ankle fracture sugery Left    BIOPSY  07/20/2020   Procedure: BIOPSY;  Surgeon: Daneil Dolin, MD;  [facility];  Service: Endoscopy;;  cecal polyp   COLONOSCOPY WITH PROPOFOL N/A 05/29/2017   Dr. [name]: Multiple tubular adenomas removed, prep inadequate.  Short interval surveillance colonoscopy recommended in 6 months   COLONOSCOPY WITH PROPOFOL N/A 07/20/2020   Procedure: COLONOSCOPY WITH PROPOFOL;  Surgeon: Daneil Dolin, MD;  [facility];  Service: Endoscopy;  Laterality: N/A;  am appt, early as possible since diabetic   LEFT HEART CATH AND CORONARY ANGIOGRAPHY N/A 01/23/2018   Procedure: LEFT HEART CATH AND CORONARY ANGIOGRAPHY;  Surgeon: Troy Sine, MD;  Location: [place] [facility] LAB;  Service: Cardiovascular;  Laterality: N/A;   LUMBAR SPINE SURGERY     fusion   POLYPECTOMY  05/29/2017   Procedure: POLYPECTOMY;  Surgeon: Daneil Dolin, MD;  [facility];  Service: Endoscopy;;  ascending colon polyp x5-cs transverse colon polyp x4- cs descending colon polyp x1- hs sigmoid colon polyp x1 -hs rectal polyp x1- hs   TONSILLECTOMY      Social History   Socioeconomic History   Marital status: Single    Spouse name: Not on file   Number of children: Not on file   Years of education: Not on file   Highest education level: Not on file  Occupational History   Occupation: disabled  Tobacco Use   Smoking status: Never   Smokeless tobacco: Never  Vaping Use   Vaping Use: Never used  Substance and Sexual Activity   Alcohol use: Yes    Comment: occ   Drug use: Not Currently    Types: Marijuana    Comment: daily; 05/19/20 no marijuana in past 6 months d/t going to pain management clinic   Sexual activity:  Not Currently    Birth control/protection: None  Other Topics Concern   Not on file  Social History Narrative   Not on file   Social Determinants of Health   Financial Resource Strain: Not on file  Food Insecurity: Not on file  Transportation Needs: Not on file  Physical Activity: Not on file  Stress: Not  on file  Social Connections: Not on file  Intimate Partner Violence: Not on file        Objective:    BP 121/78   Pulse 84   Temp (!) 96 F (35.6 C) (Temporal)   Ht 6' 1"  (1.854 m)   Wt (!) 385 lb 6.4 oz (174.8 kg)   SpO2 94%   BMI 50.85 kg/m   Wt Readings from Last 3 Encounters:  08/18/21 (!) 385 lb 6.4 oz (174.8 kg)  08/05/21 (!) 391 lb (177.4 kg)  05/20/21 (!) 396 lb (179.6 kg)    Physical Exam Vitals reviewed.  Constitutional:      General: He is not in acute distress.    Appearance: Normal appearance. He is morbidly obese. He is not ill-appearing, toxic-appearing or diaphoretic.  HENT:     Head: Normocephalic and atraumatic.  Eyes:     General: No scleral icterus.       Right eye: No discharge.        Left eye: No discharge.     Conjunctiva/sclera: Conjunctivae normal.  Cardiovascular:     Rate and Rhythm: Normal rate and regular rhythm.     Heart sounds: Normal heart sounds. No murmur heard.   No friction rub. No gallop.  Pulmonary:     Effort: Pulmonary effort is normal. No respiratory distress.     Breath sounds: Normal breath sounds. No stridor. No wheezing, rhonchi or rales.  Musculoskeletal:        General: Normal range of motion.     Cervical back: Normal range of motion.  Skin:    General: Skin is warm and dry.  Neurological:     Mental Status: He is alert and oriented to person, place, and time. Mental status is at baseline.  Psychiatric:        Mood and Affect: Mood normal.        Behavior: Behavior normal.        Thought Content: Thought content normal.        Judgment: Judgment normal.    Lab Results  Component Value Date   TSH  2.69 04/29/2021   Lab Results  Component Value Date   WBC 12.4 04/29/2021   HGB 14.5 04/29/2021   HCT 46.4 02/11/2021   MCV 97 02/11/2021   PLT 286 04/29/2021   Lab Results  Component Value Date   NA 138 02/11/2021   K 4.1 04/29/2021   CO2 29 (A) 04/29/2021   GLUCOSE 320 (H) 02/11/2021   BUN 18 02/11/2021   CREATININE 1.41 (H) 02/11/2021   BILITOT 0.4 02/11/2021   ALKPHOS 73 04/29/2021   AST 19 04/29/2021   ALT 18 04/29/2021   PROT 6.8 02/11/2021   ALBUMIN 4.0 02/11/2021   CALCIUM 9.5 02/11/2021   ANIONGAP 10 04/10/2019   EGFR 57 (L) 02/11/2021   Lab Results  Component Value Date   CHOL 154 04/29/2021   Lab Results  Component Value Date   HDL 36 04/29/2021   Lab Results  Component Value Date   LDLCALC 86 04/29/2021   Lab Results  Component Value Date   TRIG 160 04/29/2021   Lab Results  Component Value Date   CHOLHDL 4.2 02/11/2021   Lab Results  Component Value Date   HGBA1C 9.0 04/29/2021

## 2021-08-19 ENCOUNTER — Other Ambulatory Visit: Payer: Self-pay | Admitting: Family Medicine

## 2021-08-19 LAB — CBC WITH DIFFERENTIAL/PLATELET
Basophils Absolute: 0.1 10*3/uL (ref 0.0–0.2)
Basos: 1 %
EOS (ABSOLUTE): 0.2 10*3/uL (ref 0.0–0.4)
Eos: 2 %
Hematocrit: 47.1 % (ref 37.5–51.0)
Hemoglobin: 15.3 g/dL (ref 13.0–17.7)
Immature Grans (Abs): 0.1 10*3/uL (ref 0.0–0.1)
Immature Granulocytes: 1 %
Lymphocytes Absolute: 1.1 10*3/uL (ref 0.7–3.1)
Lymphs: 10 %
MCH: 32.1 pg (ref 26.6–33.0)
MCHC: 32.5 g/dL (ref 31.5–35.7)
MCV: 99 fL — ABNORMAL HIGH (ref 79–97)
Monocytes Absolute: 0.8 10*3/uL (ref 0.1–0.9)
Monocytes: 8 %
Neutrophils Absolute: 8.3 10*3/uL — ABNORMAL HIGH (ref 1.4–7.0)
Neutrophils: 78 %
Platelets: 295 10*3/uL (ref 150–450)
RBC: 4.77 x10E6/uL (ref 4.14–5.80)
RDW: 12.4 % (ref 11.6–15.4)
WBC: 10.6 10*3/uL (ref 3.4–10.8)

## 2021-08-19 LAB — CMP14+EGFR
ALT: 26 IU/L (ref 0–44)
AST: 19 IU/L (ref 0–40)
Albumin/Globulin Ratio: 1.4 (ref 1.2–2.2)
Albumin: 4.1 g/dL (ref 3.8–4.9)
Alkaline Phosphatase: 86 IU/L (ref 44–121)
BUN/Creatinine Ratio: 11 (ref 9–20)
BUN: 16 mg/dL (ref 6–24)
Bilirubin Total: 0.5 mg/dL (ref 0.0–1.2)
CO2: 26 mmol/L (ref 20–29)
Calcium: 9.5 mg/dL (ref 8.7–10.2)
Chloride: 94 mmol/L — ABNORMAL LOW (ref 96–106)
Creatinine, Ser: 1.47 mg/dL — ABNORMAL HIGH (ref 0.76–1.27)
Globulin, Total: 3 g/dL (ref 1.5–4.5)
Glucose: 375 mg/dL — ABNORMAL HIGH (ref 70–99)
Potassium: 4.5 mmol/L (ref 3.5–5.2)
Sodium: 137 mmol/L (ref 134–144)
Total Protein: 7.1 g/dL (ref 6.0–8.5)
eGFR: 55 mL/min/{1.73_m2} — ABNORMAL LOW (ref >59)

## 2021-08-19 LAB — VITAMIN D 25 HYDROXY (VIT D DEFICIENCY, FRACTURES): Vit D, 25-Hydroxy: 30.8 ng/mL (ref 30.0–100.0)

## 2021-08-19 LAB — LIPID PANEL
Chol/HDL Ratio: 4.5 ratio (ref 0.0–5.0)
Cholesterol, Total: 140 mg/dL (ref 100–199)
HDL: 31 mg/dL — ABNORMAL LOW (ref >39)
LDL Chol Calc (NIH): 78 mg/dL (ref 0–99)
Triglycerides: 179 mg/dL — ABNORMAL HIGH (ref 0–149)
VLDL Cholesterol Cal: 31 mg/dL (ref 5–40)

## 2021-08-19 LAB — VITAMIN B12: Vitamin B-12: 385 pg/mL (ref 232–1245)

## 2021-08-23 ENCOUNTER — Encounter: Payer: Self-pay | Admitting: Family Medicine

## 2021-08-23 DIAGNOSIS — E2740 Unspecified adrenocortical insufficiency: Secondary | ICD-10-CM | POA: Diagnosis not present

## 2021-08-23 DIAGNOSIS — N184 Chronic kidney disease, stage 4 (severe): Secondary | ICD-10-CM | POA: Diagnosis not present

## 2021-08-23 DIAGNOSIS — M539 Dorsopathy, unspecified: Secondary | ICD-10-CM | POA: Diagnosis not present

## 2021-08-23 DIAGNOSIS — Z79899 Other long term (current) drug therapy: Secondary | ICD-10-CM | POA: Diagnosis not present

## 2021-08-23 DIAGNOSIS — E1165 Type 2 diabetes mellitus with hyperglycemia: Secondary | ICD-10-CM | POA: Diagnosis not present

## 2021-08-23 DIAGNOSIS — Z6841 Body Mass Index (BMI) 40.0 and over, adult: Secondary | ICD-10-CM | POA: Diagnosis not present

## 2021-08-23 DIAGNOSIS — E291 Testicular hypofunction: Secondary | ICD-10-CM | POA: Diagnosis not present

## 2021-08-23 DIAGNOSIS — R030 Elevated blood-pressure reading, without diagnosis of hypertension: Secondary | ICD-10-CM | POA: Diagnosis not present

## 2021-08-25 ENCOUNTER — Other Ambulatory Visit (HOSPITAL_COMMUNITY): Payer: Self-pay | Admitting: Family Medicine

## 2021-08-25 ENCOUNTER — Ambulatory Visit (HOSPITAL_COMMUNITY)
Admission: RE | Admit: 2021-08-25 | Discharge: 2021-08-25 | Disposition: A | Discharge status: Home / Self Care | Payer: Medicaid Other | Source: Ambulatory Visit | Attending: Family Medicine | Admitting: Family Medicine

## 2021-08-25 DIAGNOSIS — M539 Dorsopathy, unspecified: Secondary | ICD-10-CM

## 2021-08-25 DIAGNOSIS — M5136 Other intervertebral disc degeneration, lumbar region: Secondary | ICD-10-CM | POA: Diagnosis not present

## 2021-08-25 DIAGNOSIS — M47817 Spondylosis without myelopathy or radiculopathy, lumbosacral region: Secondary | ICD-10-CM | POA: Diagnosis not present

## 2021-09-20 DIAGNOSIS — M539 Dorsopathy, unspecified: Secondary | ICD-10-CM | POA: Diagnosis not present

## 2021-09-20 DIAGNOSIS — E291 Testicular hypofunction: Secondary | ICD-10-CM | POA: Diagnosis not present

## 2021-09-20 DIAGNOSIS — R030 Elevated blood-pressure reading, without diagnosis of hypertension: Secondary | ICD-10-CM | POA: Diagnosis not present

## 2021-09-20 DIAGNOSIS — Z6841 Body Mass Index (BMI) 40.0 and over, adult: Secondary | ICD-10-CM | POA: Diagnosis not present

## 2021-09-20 DIAGNOSIS — E119 Type 2 diabetes mellitus without complications: Secondary | ICD-10-CM | POA: Diagnosis not present

## 2021-09-20 DIAGNOSIS — Z79899 Other long term (current) drug therapy: Secondary | ICD-10-CM | POA: Diagnosis not present

## 2021-09-23 ENCOUNTER — Encounter: Payer: Self-pay | Admitting: Nurse Practitioner

## 2021-09-23 ENCOUNTER — Ambulatory Visit (INDEPENDENT_AMBULATORY_CARE_PROVIDER_SITE_OTHER): Payer: Medicaid Other | Admitting: Nurse Practitioner

## 2021-09-23 VITALS — BP 104/64 | HR 97 | Ht 73.0 in | Wt 388.0 lb

## 2021-09-23 DIAGNOSIS — I10 Essential (primary) hypertension: Secondary | ICD-10-CM

## 2021-09-23 DIAGNOSIS — E1122 Type 2 diabetes mellitus with diabetic chronic kidney disease: Secondary | ICD-10-CM

## 2021-09-23 DIAGNOSIS — E559 Vitamin D deficiency, unspecified: Secondary | ICD-10-CM | POA: Diagnosis not present

## 2021-09-23 DIAGNOSIS — E782 Mixed hyperlipidemia: Secondary | ICD-10-CM | POA: Diagnosis not present

## 2021-09-23 DIAGNOSIS — N1831 Chronic kidney disease, stage 3a: Secondary | ICD-10-CM | POA: Diagnosis not present

## 2021-09-23 MED ORDER — ACCU-CHEK SOFTCLIX LANCETS MISC: 12 refills | Status: DC

## 2021-09-23 MED ORDER — ACCU-CHEK GUIDE VI STRP
ORAL_STRIP | 12 refills | Status: DC
Start: 2021-09-23 — End: 2021-10-28

## 2021-09-23 NOTE — Patient Instructions (Signed)

## 2021-09-23 NOTE — Progress Notes (Signed)
Endocrinology Consult Note       09/23/2021, 3:35 PM   Subjective:    Patient ID: Bradley Torres, male    DOB: 30-Jan-1961.  Bradley Torres is being seen in consultation for management of currently uncontrolled symptomatic diabetes requested by  [name], [name].   Past Medical History:  Diagnosis Date   CAD (coronary artery disease)    a. 12/2017: cath showing 10% Proximal-LAD stenosis with no significant obstructive disease and LVEDP mildly elevated at 18 mm Hg.    Chronic back pain    Diabetes mellitus, type 2 (HCC)    DVT (deep venous thrombosis) (HCC)    Essential hypertension    MI (myocardial infarction) ([place]) 2019   Morbid obesity ([place])    Panic disorder    Shoulder pain, left    Following fall   Sleep apnea    Stroke ([place]) 01/2018   Vitamin D deficiency     Past Surgical History:  Procedure Laterality Date   Ankle fracture sugery Left    BIOPSY  07/20/2020   Procedure: BIOPSY;  Surgeon: Daneil Dolin, MD;  [facility];  Service: Endoscopy;;  cecal polyp   COLONOSCOPY WITH PROPOFOL N/A 05/29/2017   Dr. [name]: Multiple tubular adenomas removed, prep inadequate.  Short interval surveillance colonoscopy recommended in 6 months   COLONOSCOPY WITH PROPOFOL N/A 07/20/2020   Procedure: COLONOSCOPY WITH PROPOFOL;  Surgeon: Daneil Dolin, MD;  [facility];  Service: Endoscopy;  Laterality: N/A;  am appt, early as possible since diabetic   LEFT HEART CATH AND CORONARY ANGIOGRAPHY N/A 01/23/2018   Procedure: LEFT HEART CATH AND CORONARY ANGIOGRAPHY;  Surgeon: Troy Sine, MD;  Location: [facility];  Service: Cardiovascular;  Laterality: N/A;   LUMBAR SPINE SURGERY     fusion   POLYPECTOMY  05/29/2017   Procedure: POLYPECTOMY;  Surgeon: Daneil Dolin, MD;  [facility];  Service: Endoscopy;;  ascending colon polyp x5-cs transverse colon polyp x4-  cs descending colon polyp x1- hs sigmoid colon polyp x1 -hs rectal polyp x1- hs   TONSILLECTOMY      Social History   Socioeconomic History   Marital status: Single    Spouse name: Not on file   Number of children: Not on file   Years of education: Not on file   Highest education level: Not on file  Occupational History   Occupation: disabled  Tobacco Use   Smoking status: Never   Smokeless tobacco: Never  Vaping Use   Vaping Use: Never used  Substance and Sexual Activity   Alcohol use: Yes    Comment: occ   Drug use: Not Currently    Types: Marijuana    Comment: daily; 05/19/20 no marijuana in past 6 months d/t going to pain management clinic   Sexual activity: Not Currently    Birth control/protection: None  Other Topics Concern   Not on file  Social History Narrative   Not on file   Social Determinants of Health   Financial Resource Strain: Medium Risk (05/18/2020)   Overall Financial Resource Strain (CARDIA)    Difficulty of Paying Living Expenses: Somewhat  hard  Food Insecurity: No Food Insecurity (01/16/2020)   Hunger Vital Sign    Worried About Running Out of Food in the Last Year: Never true    Ran Out of Food in the Last Year: Never true  Transportation Needs: No Transportation Needs (01/16/2020)   PRAPARE - Hydrologist (Medical): No    Lack of Transportation (Non-Medical): No  Physical Activity: Not on file  Stress: Stress Concern Present (05/15/2019)   [place]    Feeling of Stress : Rather much  Social Connections: Unknown (01/16/2020)   Social Connection and Isolation Panel [NHANES]    Frequency of Communication with Friends and Family: More than three times a week    Frequency of Social Gatherings with Friends and Family: More than three times a week    Attends Religious Services: Not on Advertising copywriter or Organizations: Not on file     Attends Archivist Meetings: Not on file    Marital Status: Not on file    Family History  Problem Relation Age of Onset   Colon cancer Mother        died at age 46   Heart attack Father    Hepatitis Sister 67       Autoimmune   Heart attack Paternal Grandfather     Outpatient Encounter Medications as of 09/23/2021  Medication Sig   Accu-Chek Softclix Lancets lancets Use as instructed to monitor glucose twice daily   DHEA 25 MG CAPS Take by mouth daily.   glucose blood (ACCU-CHEK GUIDE) test strip Use as instructed to monitor glucose twice daily   allopurinol (ZYLOPRIM) 100 MG tablet Take 1 tablet (100 mg total) by mouth daily.   atorvastatin (LIPITOR) 20 MG tablet Take 1 tablet (20 mg total) by mouth daily.   chlorthalidone (HYGROTON) 25 MG tablet Take 25 mg by mouth as directed. TAKE 1/2 TABLET DAILY   Cholecalciferol (VITAMIN D3) 250 MCG (10000 UT) capsule Take 10,000 Units by mouth daily.   FARXIGA 10 MG TABS tablet TAKE ONE TABLET BY MOUTH DAILY BEFORE BREAKFAST   gabapentin (NEURONTIN) 300 MG capsule Take 1 capsule by mouth daily as needed (pain).   hydrALAZINE (APRESOLINE) 50 MG tablet TAKE ONE TABLET BY MOUTH THREE TIMES DAILY *PLEASE SCHEDULE APPOINTMENT FOR FURTHER REFILLS*   metFORMIN (GLUCOPHAGE-XR) 500 MG 24 hr tablet Take 500 mg by mouth daily with breakfast.   methocarbamol (ROBAXIN) 500 MG tablet Take 1 tablet (500 mg total) by mouth every 8 (eight) hours as needed for muscle spasms.   oxyCODONE-acetaminophen (PERCOCET) 10-325 MG tablet Take 1 tablet by mouth 3 (three) times daily as needed for pain.   sitaGLIPtin (JANUVIA) 100 MG tablet Take 1 tablet (100 mg total) by mouth daily.   testosterone cypionate (DEPOTESTOSTERONE CYPIONATE) 200 MG/ML injection Inject 200 mg into the muscle every 14 (fourteen) days.   valsartan (DIOVAN) 320 MG tablet TAKE ONE TABLET BY MOUTH DAILY (Patient taking differently: Take 160 mg by mouth daily.)   XARELTO 20 MG TABS tablet  TAKE ONE TABLET BY MOUTH DAILY WITH SUPPER   [DISCONTINUED] glucose blood (ONETOUCH VERIO) test strip USE TO TEST BLOOD SUGAR DAILY AS DIRECTED. DX: 11.9   [DISCONTINUED] OneTouch Delica Lancets 65K MISC USE TO TEST BLOOD SUGAR DAILY AS DIRECTED. DX: 11.9   No facility-administered encounter medications on file as of 09/23/2021.    ALLERGIES: Allergies  Allergen Reactions  Lisinopril Other (See Comments)    wheezing   Bystolic [Nebivolol Hcl] Swelling   Ibuprofen Other (See Comments)    Rectal bleed   Spironolactone Hives   Trulicity [Dulaglutide]     Sob, dry cough, GI   Amlodipine Itching and Other (See Comments)    Leg pain.   Diflucan [Fluconazole] Rash   Hctz [Hydrochlorothiazide] Other (See Comments)    Caused gout flares.   Losartan Rash    VACCINATION STATUS: Immunization History  Administered Date(s) Administered   Influenza,inj,Quad PF,6+ Mos 02/01/2018, 01/07/2019, 12/12/2019, 02/11/2021   Moderna Sars-Covid-2 Vaccination 08/22/2019, 09/19/2019    Diabetes He presents for his initial diabetic visit. He has type 2 diabetes mellitus. Onset time: prediabetes for years, DM diagnosis at age 28. His disease course has been worsening. There are no hypoglycemic associated symptoms. Associated symptoms include blurred vision, fatigue, polydipsia and polyuria. There are no hypoglycemic complications. Symptoms are stable. Diabetic complications include a CVA, heart disease and nephropathy. Risk factors for coronary artery disease include diabetes mellitus, dyslipidemia, family history, male sex, hypertension, obesity and sedentary lifestyle. Current diabetic treatment includes oral agent (triple therapy). He is compliant with treatment most of the time. His weight is fluctuating minimally. He is following a generally unhealthy diet. When asked about meal planning, he reported none. He has not had a previous visit with a dietitian. He rarely participates in exercise. (He presents  today for his consultation with no meter or logs to review.  His most recent A1c on 5/24 was 10.1%, worsening.  He does not monitor glucose at home, says he has no need to.  He drinks soda [facility]), sweet tea, and little water and eats generally only 1 meal per day plus snacks.  He does not engage in routine physical activity.  He is due for eye exam, has never seen podiatry in the past.  Due to his size and multiple CVAs and MIs in the past, he cannot stand/walk long distances without having to rest limiting his ability to prepare foods at home.) An ACE inhibitor/angiotensin II receptor blocker is being taken. He does not see a podiatrist.Eye exam is current.     Review of systems  Constitutional: + Minimally fluctuating body weight, current Body mass index is 51.19 kg/m., + fatigue, no subjective hyperthermia, no subjective hypothermia Eyes: + blurry vision, no xerophthalmia ENT: no sore throat, no nodules palpated in throat, no dysphagia/odynophagia, no hoarseness Cardiovascular: no chest pain, no shortness of breath, no palpitations, no leg swelling Respiratory: no cough, no shortness of breath Gastrointestinal: no nausea/vomiting/diarrhea Musculoskeletal: + diffuse muscle/joint aches Skin: no rashes, no hyperemia Neurological: no tremors, no numbness, no tingling, no dizziness Psychiatric: no depression, no anxiety  Objective:     BP 104/64   Pulse 97   Ht '6\' 1"'$  (1.854 m)   Wt (!) 388 lb (176 kg)   BMI 51.19 kg/m   Wt Readings from Last 3 Encounters:  09/23/21 (!) 388 lb (176 kg)  08/18/21 (!) 385 lb 6.4 oz (174.8 kg)  08/05/21 (!) 391 lb (177.4 kg)     BP Readings from Last 3 Encounters:  09/23/21 104/64  08/18/21 121/78  08/05/21 110/64     Physical Exam- Limited  Constitutional:  Body mass index is 51.19 kg/m. , not in acute distress, normal state of mind Eyes:  EOMI, no exophthalmos Neck: Supple Cardiovascular: RRR, no murmurs, rubs, or gallops, no  edema Respiratory: Adequate breathing efforts, no crackles, rales, rhonchi, or wheezing Musculoskeletal: no  gross deformities, strength intact in all four extremities, no gross restriction of joint movements Skin:  no rashes, no hyperemia Neurological: no tremor with outstretched hands    CMP ( most recent) CMP     Component Value Date/Time   NA 137 08/18/2021 1412   K 4.5 08/18/2021 1412   CL 94 (L) 08/18/2021 1412   CO2 26 08/18/2021 1412   GLUCOSE 375 (H) 08/18/2021 1412   GLUCOSE 141 (H) 04/10/2019 1314   BUN 16 08/18/2021 1412   CREATININE 1.47 (H) 08/18/2021 1412   CALCIUM 9.5 08/18/2021 1412   PROT 7.1 08/18/2021 1412   ALBUMIN 4.1 08/18/2021 1412   AST 19 08/18/2021 1412   ALT 26 08/18/2021 1412   ALKPHOS 86 08/18/2021 1412   BILITOT 0.5 08/18/2021 1412   GFRNONAA 56 12/16/2019 0000   GFRAA 68 12/16/2019 0000     Diabetic Labs (most recent): Lab Results  Component Value Date   HGBA1C 10.1 (H) 08/18/2021   HGBA1C 9.0 04/29/2021   HGBA1C 9.6 (H) 02/11/2021     Lipid Panel ( most recent) Lipid Panel     Component Value Date/Time   CHOL 140 08/18/2021 1412   TRIG 179 (H) 08/18/2021 1412   HDL 31 (L) 08/18/2021 1412   CHOLHDL 4.5 08/18/2021 1412   CHOLHDL 5.1 01/20/2018 0209   VLDL 26 01/20/2018 0209   LDLCALC 78 08/18/2021 1412   LABVLDL 31 08/18/2021 1412      Lab Results  Component Value Date   TSH 2.69 04/29/2021   TSH 2.840 05/13/2019   TSH 2.310 10/17/2018           Assessment & Plan:   1) Type 2 diabetes mellitus with stage 3a chronic kidney disease, without long-term current use of insulin ([place])  He presents today for his consultation with no meter or logs to review.  His most recent A1c on 5/24 was 10.1%, worsening.  He does not monitor glucose at home, says he has no need to.  He drinks soda [facility]), sweet tea, and little water and eats generally only 1 meal per day plus snacks.  He does not engage in routine physical activity.  He  is due for eye exam, has never seen podiatry in the past.  Due to his size and multiple CVAs and MIs in the past, he cannot stand/walk long distances without having to rest limiting his ability to prepare foods at home.  - Bradley Torres has currently uncontrolled symptomatic type 2 DM since 60 years of age, with most recent A1c of 10.1 %.   -Recent labs reviewed.  - I had a long discussion with him about the progressive nature of diabetes and the pathology behind its complications. -his diabetes is complicated by CAD, CVA, MI, CKD and he remains at a high risk for more acute and chronic complications which include CAD, CVA, CKD, retinopathy, and neuropathy. These are all discussed in detail with him.  The following Lifestyle Medicine recommendations according to [facility]) were discussed and offered to patient and he agrees to start the journey:  A. Whole Foods, Plant-based plate comprising of fruits and vegetables, plant-based proteins, whole-grain carbohydrates was discussed in detail with the patient.   A list for source of those nutrients were also provided to the patient.  Patient will use only water or unsweetened tea for hydration. B.  The need to stay away from risky substances including alcohol, smoking; obtaining 7 to 9 hours of  restorative sleep, at least 150 minutes of moderate intensity exercise weekly, the importance of healthy social connections,  and stress reduction techniques were discussed. C.  A full color page of  Calorie density of various food groups per pound showing examples of each food groups was provided to the patient.  - I have counseled him on diet and weight management by adopting a carbohydrate restricted/protein rich diet. Patient is encouraged to switch to unprocessed or minimally processed complex starch and increased protein intake (animal or plant source), fruits, and vegetables. -  he is advised to stick to a routine mealtimes  to eat 3 meals a day and avoid unnecessary snacks (to snack only to correct hypoglycemia).   - he acknowledges that there is a room for improvement in his food and drink choices. - Suggestion is made for him to avoid simple carbohydrates from his diet including Cakes, Sweet Desserts, Ice Cream, Soda (diet and regular), Sweet Tea, Candies, Chips, Cookies, Store Bought Juices, Alcohol in Excess of 1-2 drinks a day, Artificial Sweeteners, Coffee Creamer, and "Sugar-free" Products. This will help patient to have more stable blood glucose profile and potentially avoid unintended weight gain.  - he will be scheduled with Jearld Fenton, RDN, CDE for diabetes education.  - I have approached him with the following individualized plan to manage his diabetes and patient agrees:   -he is encouraged to start monitoring glucose twice daily, before breakfast and before bed, to log their readings on the clinic sheets provided, and bring them to review at follow up appointment in 1 month.  - Adjustment parameters are given to him for hypo and hyperglycemia in writing. - he is encouraged to call clinic for blood glucose levels less than 70 or above 300 mg /dl. - he is advised to continue Metformin 500 mg ER daily with breakfast, Januvia 100 mg po daily and Farxiga 10 mg po daily, therapeutically suitable for patient.  Insulin will likely be needed in his case as he is VERY resistant to change his eating habits.  - he is not a candidate for full dose Metformin due to concurrent renal insufficiency.  -He had been on Trulicity in the past which caused SOB, cough, throat swelling.  Will avoid this class of meds moving forward.  - Specific targets for  A1c; LDL, HDL, and Triglycerides were discussed with the patient.  2) Blood Pressure /Hypertension:  his blood pressure is controlled to target.   he is advised to continue his current medications including Diovan 320 mg p.o. daily with breakfast, Hydralazine 50 mg po  TID, and Chlorthalidone 12.5 mg po daily.  3) Lipids/Hyperlipidemia:    Review of his recent lipid panel from 08/18/21 showed controlled LDL at 79 and elevated triglycerides of 179 .  he is advised to continue Lipitor 20 mg daily at bedtime.  Side effects and precautions discussed with him.  4)  Weight/Diet:  his Body mass index is 51.19 kg/m.  -  clearly complicating his diabetes care.   he is a candidate for weight loss. I discussed with him the fact that loss of 5 - 10% of his  current body weight will have the most impact on his diabetes management.  Exercise, and detailed carbohydrates information provided  -  detailed on discharge instructions.  5) Chronic Care/Health Maintenance: -he is on ACEI/ARB and Statin medications and is encouraged to initiate and continue to follow up with Ophthalmology, Dentist, Podiatrist at least yearly or according to recommendations, and advised to  stay away from smoking. I have recommended yearly flu vaccine and pneumonia vaccine at least every 5 years; moderate intensity exercise for up to 150 minutes weekly; and sleep for at least 7 hours a day.  - he is advised to maintain close follow up with [name], [name] for primary care needs, as well as his other providers for optimal and coordinated care.   - Time spent in this patient care: 60 min, of which > 50% was spent in counseling him about his diabetes and the rest reviewing his blood glucose logs, discussing his hypoglycemia and hyperglycemia episodes, reviewing his current and previous labs/studies (including abstraction from other facilities) and medications doses and developing a long term treatment plan based on the latest standards of care/guidelines; and documenting his care.    Please refer to Patient Instructions for Blood Glucose Monitoring and Insulin/Medications Dosing Guide" in media tab for additional information. Please also refer to "Patient Self Inventory" in the Media tab for reviewed  elements of pertinent patient history.  Bradley Torres participated in the discussions, expressed understanding, and voiced agreement with the above plans.  All questions were answered to his satisfaction. he is encouraged to contact clinic should he have any questions or concerns prior to his return visit.     Follow up plan: - Return in about 1 month (around 10/23/2021) for Diabetes F/U, Bring meter and logs.    Rayetta Pigg, [facility] [address] [place], [place] [address] Phone: [phone] Fax: [phone]  09/23/2021, 3:35 PM

## 2021-10-08 ENCOUNTER — Ambulatory Visit (INDEPENDENT_AMBULATORY_CARE_PROVIDER_SITE_OTHER): Payer: Medicaid Other | Admitting: Family Medicine

## 2021-10-08 ENCOUNTER — Other Ambulatory Visit: Payer: Self-pay | Admitting: Family Medicine

## 2021-10-08 ENCOUNTER — Encounter: Payer: Self-pay | Admitting: Family Medicine

## 2021-10-08 DIAGNOSIS — L0292 Furuncle, unspecified: Secondary | ICD-10-CM

## 2021-10-08 MED ORDER — AMOXICILLIN-POT CLAVULANATE 875-125 MG PO TABS: 1.0000 | ORAL_TABLET | Freq: Two times a day (BID) | ORAL | 0 refills | Status: AC

## 2021-10-08 NOTE — Progress Notes (Signed)
Virtual Visit via Telephone Note  I connected with Bradley Torres on 10/08/21 at 1:29 PM by telephone and verified that I am speaking with the correct person using two identifiers. Bradley Torres is currently located at home and nobody is currently with him during this visit. The provider, [name], [name] is located in their office at time of visit.  I discussed the limitations, risks, security and privacy concerns of performing an evaluation and management service by telephone and the availability of in person appointments. I also discussed with the patient that there may be a patient responsible charge related to this service. The patient expressed understanding and agreed to proceed.  Subjective: PCP: [name], [name]  Chief Complaint  Patient presents with   Diverticulitis   Patient reports the pain he previously had on his right side has resolved. He believes it was a big boil on the inside, associated with diverticulitis, that popped and now he has boils in his groin area, nose, shoulder, and leg. They are draining. He states this normally happens with a diverticulitis flare on the left side. He feels very weak and "bad". Denies fever, nausea, vomiting, and diarrhea.    ROS: Per HPI  Current Outpatient Medications:    Accu-Chek Softclix Lancets lancets, Use as instructed to monitor glucose twice daily, Disp: 100 each, Rfl: 12   allopurinol (ZYLOPRIM) 100 MG tablet, Take 1 tablet (100 mg total) by mouth daily., Disp: 90 tablet, Rfl: 1   atorvastatin (LIPITOR) 20 MG tablet, Take 1 tablet (20 mg total) by mouth daily., Disp: 90 tablet, Rfl: 3   chlorthalidone (HYGROTON) 25 MG tablet, Take 25 mg by mouth as directed. TAKE 1/2 TABLET DAILY, Disp: , Rfl:    Cholecalciferol (VITAMIN D3) 250 MCG (10000 UT) capsule, Take 10,000 Units by mouth daily., Disp: , Rfl:    DHEA 25 MG CAPS, Take by mouth daily., Disp: , Rfl:    FARXIGA 10 MG TABS tablet, TAKE ONE TABLET BY MOUTH DAILY  BEFORE BREAKFAST, Disp: 90 tablet, Rfl: 0   gabapentin (NEURONTIN) 300 MG capsule, Take 1 capsule by mouth daily as needed (pain)., Disp: , Rfl:    glucose blood (ACCU-CHEK GUIDE) test strip, Use as instructed to monitor glucose twice daily, Disp: 100 each, Rfl: 12   hydrALAZINE (APRESOLINE) 50 MG tablet, TAKE ONE TABLET BY MOUTH THREE TIMES DAILY *PLEASE SCHEDULE APPOINTMENT FOR FURTHER REFILLS*, Disp: 90 tablet, Rfl: 0   metFORMIN (GLUCOPHAGE-XR) 500 MG 24 hr tablet, Take 500 mg by mouth daily with breakfast., Disp: , Rfl:    methocarbamol (ROBAXIN) 500 MG tablet, Take 1 tablet (500 mg total) by mouth every 8 (eight) hours as needed for muscle spasms., Disp: 60 tablet, Rfl: 1   oxyCODONE-acetaminophen (PERCOCET) 10-325 MG tablet, Take 1 tablet by mouth 3 (three) times daily as needed for pain., Disp: , Rfl:    sitaGLIPtin (JANUVIA) 100 MG tablet, Take 1 tablet (100 mg total) by mouth daily., Disp: 90 tablet, Rfl: 1   testosterone cypionate (DEPOTESTOSTERONE CYPIONATE) 200 MG/ML injection, Inject 200 mg into the muscle every 14 (fourteen) days., Disp: , Rfl:    valsartan (DIOVAN) 320 MG tablet, TAKE ONE TABLET BY MOUTH DAILY (Patient taking differently: Take 160 mg by mouth daily.), Disp: 30 tablet, Rfl: 6   XARELTO 20 MG TABS tablet, TAKE ONE TABLET BY MOUTH DAILY WITH SUPPER, Disp: 90 tablet, Rfl: 0  Allergies  Allergen Reactions   Lisinopril Other (See Comments)  wheezing   Bystolic [Nebivolol Hcl] Swelling   Ibuprofen Other (See Comments)    Rectal bleed   Spironolactone Hives   Trulicity [Dulaglutide]     Sob, dry cough, GI   Amlodipine Itching and Other (See Comments)    Leg pain.   Diflucan [Fluconazole] Rash   Hctz [Hydrochlorothiazide] Other (See Comments)    Caused gout flares.   Losartan Rash   Past Medical History:  Diagnosis Date   CAD (coronary artery disease)    a. 12/2017: cath showing 10% Proximal-LAD stenosis with no significant obstructive disease and LVEDP  mildly elevated at 18 mm Hg.    Chronic back pain    Diabetes mellitus, type 2 (HCC)    DVT (deep venous thrombosis) ([place])    Essential hypertension    MI (myocardial infarction) ([place]) 2019   Morbid obesity ([place])    Panic disorder    Shoulder pain, left    Following fall   Sleep apnea    Stroke ([place]) 01/2018   Vitamin D deficiency     Observations/Objective: A&O  No respiratory distress or wheezing audible over the phone Mood, judgement, and thought processes all WNL  Assessment and Plan: 1. Boils of multiple sites - amoxicillin-clavulanate (AUGMENTIN) 875-125 MG tablet; Take 1 tablet by mouth 2 (two) times daily for 7 days.  Dispense: 14 tablet; Refill: 0   Follow Up Instructions:  I discussed the assessment and treatment plan with the patient. The patient was provided an opportunity to ask questions and all were answered. The patient agreed with the plan and demonstrated an understanding of the instructions.   The patient was advised to call back or seek an in-person evaluation if the symptoms worsen or if the condition fails to improve as anticipated.  The above assessment and management plan was discussed with the patient. The patient verbalized understanding of and has agreed to the management plan. Patient is aware to call the clinic if symptoms persist or worsen. Patient is aware when to return to the clinic for a follow-up visit. Patient educated on when it is appropriate to go to the emergency department.   Time call ended: 1:40 PM  I provided 11 minutes of non-face-to-face time during this encounter.  Hendricks Limes, MSN, APRN, [name]-C [facility] 10/08/21

## 2021-10-19 ENCOUNTER — Other Ambulatory Visit: Payer: Self-pay | Admitting: Cardiovascular Disease

## 2021-10-19 NOTE — Telephone Encounter (Signed)
Rx request sent to pharmacy.  

## 2021-10-25 ENCOUNTER — Ambulatory Visit: Payer: Medicaid Other | Admitting: Cardiology

## 2021-10-25 ENCOUNTER — Encounter: Payer: Self-pay | Admitting: Cardiology

## 2021-10-25 VITALS — BP 138/86 | HR 101 | Ht 72.0 in | Wt 392.6 lb

## 2021-10-25 DIAGNOSIS — I10 Essential (primary) hypertension: Secondary | ICD-10-CM

## 2021-10-25 DIAGNOSIS — D6869 Other thrombophilia: Secondary | ICD-10-CM | POA: Diagnosis not present

## 2021-10-25 DIAGNOSIS — I2510 Atherosclerotic heart disease of native coronary artery without angina pectoris: Secondary | ICD-10-CM

## 2021-10-25 DIAGNOSIS — I4891 Unspecified atrial fibrillation: Secondary | ICD-10-CM | POA: Diagnosis not present

## 2021-10-25 MED ORDER — CHLORTHALIDONE 25 MG PO TABS: 25.0000 mg | ORAL_TABLET | ORAL | 3 refills | Status: DC

## 2021-10-25 MED ORDER — CHLORTHALIDONE 25 MG PO TABS: 25.0000 mg | ORAL_TABLET | ORAL | Status: DC

## 2021-10-25 NOTE — Patient Instructions (Addendum)
Medication Instructions:  Decrease Chlorthalidone to '25mg'$  EVERY OTHER DAY  Continue all other medications.     Labwork: none  Testing/Procedures: none  Follow-Up: 6 months   Any Other Special Instructions Will Be Listed Below (If Applicable).   If you need a refill on your cardiac medications before your next appointment, please call your pharmacy.

## 2021-10-25 NOTE — Addendum Note (Signed)
Addended by: Laurine Blazer on: 10/25/2021 04:02 PM   Modules accepted: Orders

## 2021-10-25 NOTE — Progress Notes (Signed)
Clinical Summary Bradley Torres is a 61 y.o.male former patient of Dr Bronson Ing, this is our first visit together. Has been followed recently by Dr [name]  1.CAD - Cardiac catheterization in 2019 showed 10% proximal LAD stenosis and no obstructive disease.   - non specific chest pains. Often with laying side - compliant with meds  2. Palpitations - no recent palpitations.   3. HTN Patient is intolerant to multiple antihypertensive medications including ACE inhibitors, ARB's, calcium channel blockers, aldosterone inhibitors.  Recently refused hydralazine by PCP due to the fact that he did not want to take the medication multiple times per day currently on Toprol-XL 100 mg daily.    - does not check at home - he remains on chlorthalidone '25mg'$  daily, unable to cut in half per his report.  - chronic orthostatic dizziness. HgbA1c 10.1  4. History of PE - he is on xarelto   5. Pulmonary HTN - from notes thought secondary to morbid obesity, OSA   6. OSA - was not comfortable on cpap machine, not using.  - followed by Dr Brett Fairy.   7. Afib - no palpitations - compliant with exarleot.      Past Medical History:  Diagnosis Date   CAD (coronary artery disease)    a. 12/2017: cath showing 10% Proximal-LAD stenosis with no significant obstructive disease and LVEDP mildly elevated at 18 mm Hg.    Chronic back pain    Diabetes mellitus, type 2 (HCC)    DVT (deep venous thrombosis) (HCC)    Essential hypertension    MI (myocardial infarction) ([place]) 2019   Morbid obesity ([place])    Panic disorder    Shoulder pain, left    Following fall   Sleep apnea    Stroke ([place]) 01/2018   Vitamin D deficiency      Allergies  Allergen Reactions   Lisinopril Other (See Comments)    wheezing   Bystolic [Nebivolol Hcl] Swelling   Ibuprofen Other (See Comments)    Rectal bleed   Spironolactone Hives   Trulicity [Dulaglutide]     Sob, dry cough, GI   Amlodipine Itching and Other  (See Comments)    Leg pain.   Diflucan [Fluconazole] Rash   Hctz [Hydrochlorothiazide] Other (See Comments)    Caused gout flares.   Losartan Rash     Current Outpatient Medications  Medication Sig Dispense Refill   Accu-Chek Softclix Lancets lancets Use as instructed to monitor glucose twice daily 100 each 12   allopurinol (ZYLOPRIM) 100 MG tablet Take 1 tablet (100 mg total) by mouth daily. 90 tablet 1   atorvastatin (LIPITOR) 20 MG tablet Take 1 tablet (20 mg total) by mouth daily. 90 tablet 3   chlorthalidone (HYGROTON) 25 MG tablet TAKE ONE TABLET BY MOUTH DAILY 30 tablet 0   Cholecalciferol (VITAMIN D3) 250 MCG (10000 UT) capsule Take 10,000 Units by mouth daily.     DHEA 25 MG CAPS Take by mouth daily.     FARXIGA 10 MG TABS tablet TAKE ONE TABLET BY MOUTH DAILY BEFORE BREAKFAST 90 tablet 0   gabapentin (NEURONTIN) 300 MG capsule Take 1 capsule by mouth daily as needed (pain).     glucose blood (ACCU-CHEK GUIDE) test strip Use as instructed to monitor glucose twice daily 100 each 12   hydrALAZINE (APRESOLINE) 50 MG tablet Take 1 tablet (50 mg total) by mouth 3 (three) times daily. 90 tablet 1   metFORMIN (GLUCOPHAGE-XR) 500 MG 24 hr tablet Take  500 mg by mouth daily with breakfast.     methocarbamol (ROBAXIN) 500 MG tablet Take 1 tablet (500 mg total) by mouth every 8 (eight) hours as needed for muscle spasms. 60 tablet 1   oxyCODONE-acetaminophen (PERCOCET) 10-325 MG tablet Take 1 tablet by mouth 3 (three) times daily as needed for pain.     sitaGLIPtin (JANUVIA) 100 MG tablet Take 1 tablet (100 mg total) by mouth daily. 90 tablet 1   testosterone cypionate (DEPOTESTOSTERONE CYPIONATE) 200 MG/ML injection Inject 200 mg into the muscle every 14 (fourteen) days.     valsartan (DIOVAN) 320 MG tablet TAKE ONE TABLET BY MOUTH DAILY (Patient taking differently: Take 160 mg by mouth daily.) 30 tablet 6   XARELTO 20 MG TABS tablet TAKE ONE TABLET BY MOUTH DAILY WITH SUPPER 90 tablet 0    No current facility-administered medications for this visit.     Past Surgical History:  Procedure Laterality Date   Ankle fracture sugery Left    BIOPSY  07/20/2020   Procedure: BIOPSY;  Surgeon: Daneil Dolin, MD;  Location: [facility];  Service: Endoscopy;;  cecal polyp   COLONOSCOPY WITH PROPOFOL N/A 05/29/2017   Dr. [name]: Multiple tubular adenomas removed, prep inadequate.  Short interval surveillance colonoscopy recommended in 6 months   COLONOSCOPY WITH PROPOFOL N/A 07/20/2020   Procedure: COLONOSCOPY WITH PROPOFOL;  Surgeon: Daneil Dolin, MD;  Location: [facility];  Service: Endoscopy;  Laterality: N/A;  am appt, early as possible since diabetic   LEFT HEART CATH AND CORONARY ANGIOGRAPHY N/A 01/23/2018   Procedure: LEFT HEART CATH AND CORONARY ANGIOGRAPHY;  Surgeon: Troy Sine, MD;  Location: [facility];  Service: Cardiovascular;  Laterality: N/A;   LUMBAR SPINE SURGERY     fusion   POLYPECTOMY  05/29/2017   Procedure: POLYPECTOMY;  Surgeon: Daneil Dolin, MD;  Location: [facility];  Service: Endoscopy;;  ascending colon polyp x5-cs transverse colon polyp x4- cs descending colon polyp x1- hs sigmoid colon polyp x1 -hs rectal polyp x1- hs   TONSILLECTOMY       Allergies  Allergen Reactions   Lisinopril Other (See Comments)    wheezing   Bystolic [Nebivolol Hcl] Swelling   Ibuprofen Other (See Comments)    Rectal bleed   Spironolactone Hives   Trulicity [Dulaglutide]     Sob, dry cough, GI   Amlodipine Itching and Other (See Comments)    Leg pain.   Diflucan [Fluconazole] Rash   Hctz [Hydrochlorothiazide] Other (See Comments)    Caused gout flares.   Losartan Rash      Family History  Problem Relation Age of Onset   Colon cancer Mother        died at age 70   Heart attack Father    Hepatitis Sister 24       Autoimmune   Heart attack Paternal Grandfather      Social History Bradley Torres reports that he has never smoked. He has  never used smokeless tobacco. Bradley Torres reports current alcohol use.   Review of Systems CONSTITUTIONAL: No weight loss, fever, chills, weakness or fatigue.  HEENT: Eyes: No visual loss, blurred vision, double vision or yellow sclerae.No hearing loss, sneezing, congestion, runny nose or sore throat.  SKIN: No rash or itching.  CARDIOVASCULAR: per hpi RESPIRATORY: No shortness of breath, cough or sputum.  GASTROINTESTINAL: No anorexia, nausea, vomiting or diarrhea. No abdominal pain or blood.  GENITOURINARY: No burning on urination, no polyuria  NEUROLOGICAL: No headache, dizziness, syncope, paralysis, ataxia, numbness or tingling in the extremities. No change in bowel or bladder control.  MUSCULOSKELETAL: No muscle, back pain, joint pain or stiffness.  LYMPHATICS: No enlarged nodes. No history of splenectomy.  PSYCHIATRIC: No history of depression or anxiety.  ENDOCRINOLOGIC: No reports of sweating, cold or heat intolerance. No polyuria or polydipsia.  Marland Kitchen   Physical Examination Today's Vitals   10/25/21 1521  BP: 138/86  Pulse: (!) 101  SpO2: 95%  Weight: (!) 392 lb 9.6 oz (178.1 kg)  Height: 6' (1.829 m)   Body mass index is 53.25 kg/m.  Gen: resting comfortably, no acute distress HEENT: no scleral icterus, pupils equal round and reactive, no palptable cervical adenopathy,  CV: RRR, no m/r/g, no jvd Resp: Clear to auscultation bilaterally GI: abdomen is soft, non-tender, non-distended, normal bowel sounds, no hepatosplenomegaly MSK: extremities are warm, no edema.  Skin: warm, no rash Neuro:  no focal deficits Psych: appropriate affect      Assessment and Plan  1.HTN - difficult management due to medication side effects - seen by HTN clinic, stable on regimen that is controlling bp. Difficultly cutting chlorthalidone, has chronic orthostatic dizziness. Try taking chlortahlidone '25mg'$  qod  2. CAD - very mild disease by prior cath - no symptoms, continue current  meds  3. Afib/acquired thrombophilia - no symptoms, continue current meds including xarelto for stroke prevention      Arnoldo Lenis, M.D., F.A.C.C.

## 2021-10-28 ENCOUNTER — Encounter: Payer: Self-pay | Admitting: Nurse Practitioner

## 2021-10-28 ENCOUNTER — Ambulatory Visit: Payer: Medicaid Other | Admitting: Nurse Practitioner

## 2021-10-28 ENCOUNTER — Encounter: Payer: Medicaid Other | Attending: Family Medicine | Admitting: Nutrition

## 2021-10-28 VITALS — Ht 72.0 in | Wt 384.0 lb

## 2021-10-28 VITALS — BP 125/82 | HR 53 | Ht 72.0 in | Wt 384.0 lb

## 2021-10-28 DIAGNOSIS — N1831 Chronic kidney disease, stage 3a: Secondary | ICD-10-CM | POA: Diagnosis not present

## 2021-10-28 DIAGNOSIS — E559 Vitamin D deficiency, unspecified: Secondary | ICD-10-CM

## 2021-10-28 DIAGNOSIS — E1159 Type 2 diabetes mellitus with other circulatory complications: Secondary | ICD-10-CM | POA: Diagnosis not present

## 2021-10-28 DIAGNOSIS — N183 Chronic kidney disease, stage 3 unspecified: Secondary | ICD-10-CM | POA: Diagnosis not present

## 2021-10-28 DIAGNOSIS — I10 Essential (primary) hypertension: Secondary | ICD-10-CM

## 2021-10-28 DIAGNOSIS — E1165 Type 2 diabetes mellitus with hyperglycemia: Secondary | ICD-10-CM

## 2021-10-28 DIAGNOSIS — E782 Mixed hyperlipidemia: Secondary | ICD-10-CM

## 2021-10-28 DIAGNOSIS — I152 Hypertension secondary to endocrine disorders: Secondary | ICD-10-CM | POA: Insufficient documentation

## 2021-10-28 DIAGNOSIS — E1122 Type 2 diabetes mellitus with diabetic chronic kidney disease: Secondary | ICD-10-CM | POA: Diagnosis not present

## 2021-10-28 LAB — POCT GLYCOSYLATED HEMOGLOBIN (HGB A1C): HbA1c POC (<> result, manual entry): 9.9 % (ref 4.0–5.6)

## 2021-10-28 LAB — POCT UA - MICROALBUMIN
Albumin/Creatinine Ratio, Urine, POC: <30
Creatinine, POC: 300 mg/dL
Microalbumin Ur, POC: 30 mg/L

## 2021-10-28 MED ORDER — METFORMIN HCL ER 500 MG PO TB24: 500.0000 mg | ORAL_TABLET | Freq: Every day | ORAL | 3 refills | Status: DC

## 2021-10-28 MED ORDER — DAPAGLIFLOZIN PROPANEDIOL 10 MG PO TABS: ORAL_TABLET | ORAL | 3 refills | Status: DC

## 2021-10-28 MED ORDER — SITAGLIPTIN PHOSPHATE 100 MG PO TABS: 100.0000 mg | ORAL_TABLET | Freq: Every day | ORAL | 3 refills | Status: DC

## 2021-10-28 MED ORDER — ACCU-CHEK GUIDE VI STRP: ORAL_STRIP | 12 refills | Status: DC

## 2021-10-28 NOTE — Patient Instructions (Addendum)
Goals Eat 3 meals per day Take medications as prescribed. Avoid snacks between meals Don't eat after 7 pm. Eat more fruits, vegetables and grains Cut out fried and processed foods. Get A1C down to 7%

## 2021-10-28 NOTE — Progress Notes (Signed)
Endocrinology Consult Note       10/28/2021, 4:26 PM   Subjective:    Patient ID: Bradley Torres, male    DOB: Aug 05, 1960.  Bradley Torres is being seen in consultation for management of currently uncontrolled symptomatic diabetes requested by  [name], [name].   Past Medical History:  Diagnosis Date   CAD (coronary artery disease)    a. 12/2017: cath showing 10% Proximal-LAD stenosis with no significant obstructive disease and LVEDP mildly elevated at 18 mm Hg.    Chronic back pain    Diabetes mellitus, type 2 (HCC)    DVT (deep venous thrombosis) (HCC)    Essential hypertension    MI (myocardial infarction) ([place]) 2019   Morbid obesity ([place])    Panic disorder    Shoulder pain, left    Following fall   Sleep apnea    Stroke ([place]) 01/2018   Vitamin D deficiency     Past Surgical History:  Procedure Laterality Date   Ankle fracture sugery Left    BIOPSY  07/20/2020   Procedure: BIOPSY;  Surgeon: Daneil Dolin, MD;  [facility];  Service: Endoscopy;;  cecal polyp   COLONOSCOPY WITH PROPOFOL N/A 05/29/2017   Dr. [name]: Multiple tubular adenomas removed, prep inadequate.  Short interval surveillance colonoscopy recommended in 6 months   COLONOSCOPY WITH PROPOFOL N/A 07/20/2020   Procedure: COLONOSCOPY WITH PROPOFOL;  Surgeon: Daneil Dolin, MD;  [facility];  Service: Endoscopy;  Laterality: N/A;  am appt, early as possible since diabetic   LEFT HEART CATH AND CORONARY ANGIOGRAPHY N/A 01/23/2018   Procedure: LEFT HEART CATH AND CORONARY ANGIOGRAPHY;  Surgeon: Troy Sine, MD;  Location: [facility];  Service: Cardiovascular;  Laterality: N/A;   LUMBAR SPINE SURGERY     fusion   POLYPECTOMY  05/29/2017   Procedure: POLYPECTOMY;  Surgeon: Daneil Dolin, MD;  [facility]: [facility];  Service: Endoscopy;;  ascending colon polyp x5-cs transverse colon polyp x4-  cs descending colon polyp x1- hs sigmoid colon polyp x1 -hs rectal polyp x1- hs   TONSILLECTOMY      Social History   Socioeconomic History   Marital status: Single    Spouse name: Not on file   Number of children: Not on file   Years of education: Not on file   Highest education level: Not on file  Occupational History   Occupation: disabled  Tobacco Use   Smoking status: Never   Smokeless tobacco: Never  Vaping Use   Vaping Use: Never used  Substance and Sexual Activity   Alcohol use: Yes    Comment: occ   Drug use: Not Currently    Types: Marijuana    Comment: daily; 05/19/20 no marijuana in past 6 months d/t going to pain management clinic   Sexual activity: Not Currently    Birth control/protection: None  Other Topics Concern   Not on file  Social History Narrative   Not on file   Social Determinants of Health   Financial Resource Strain: Medium Risk (05/18/2020)   Overall Financial Resource Strain (CARDIA)    Difficulty of Paying Living Expenses: Somewhat  hard  Food Insecurity: No Food Insecurity (01/16/2020)   Hunger Vital Sign    Worried About Running Out of Food in the Last Year: Never true    Ran Out of Food in the Last Year: Never true  Transportation Needs: No Transportation Needs (01/16/2020)   PRAPARE - Hydrologist (Medical): No    Lack of Transportation (Non-Medical): No  Physical Activity: Not on file  Stress: Stress Concern Present (05/15/2019)   [place]    Feeling of Stress : Rather much  Social Connections: Unknown (01/16/2020)   Social Connection and Isolation Panel [NHANES]    Frequency of Communication with Friends and Family: More than three times a week    Frequency of Social Gatherings with Friends and Family: More than three times a week    Attends Religious Services: Not on Advertising copywriter or Organizations: Not on file     Attends Archivist Meetings: Not on file    Marital Status: Not on file    Family History  Problem Relation Age of Onset   Colon cancer Mother        died at age 20   Heart attack Father    Hepatitis Sister 6       Autoimmune   Heart attack Paternal Grandfather     Outpatient Encounter Medications as of 10/28/2021  Medication Sig   Accu-Chek Softclix Lancets lancets Use as instructed to monitor glucose twice daily   allopurinol (ZYLOPRIM) 100 MG tablet Take 1 tablet (100 mg total) by mouth daily.   atorvastatin (LIPITOR) 20 MG tablet Take 1 tablet (20 mg total) by mouth daily.   chlorthalidone (HYGROTON) 25 MG tablet Take 1 tablet (25 mg total) by mouth every other day.   Cholecalciferol (VITAMIN D3) 250 MCG (10000 UT) capsule Take 10,000 Units by mouth daily.   dapagliflozin propanediol (FARXIGA) 10 MG TABS tablet TAKE ONE TABLET BY MOUTH DAILY BEFORE BREAKFAST   DHEA 25 MG CAPS Take by mouth daily.   gabapentin (NEURONTIN) 300 MG capsule Take 1 capsule by mouth daily as needed (pain).   glucose blood (ACCU-CHEK GUIDE) test strip Use as instructed to monitor glucose twice daily   hydrALAZINE (APRESOLINE) 50 MG tablet Take 1 tablet (50 mg total) by mouth 3 (three) times daily.   metFORMIN (GLUCOPHAGE-XR) 500 MG 24 hr tablet Take 1 tablet (500 mg total) by mouth daily with breakfast.   methocarbamol (ROBAXIN) 500 MG tablet Take 1 tablet (500 mg total) by mouth every 8 (eight) hours as needed for muscle spasms.   oxyCODONE-acetaminophen (PERCOCET) 10-325 MG tablet Take 1 tablet by mouth 3 (three) times daily as needed for pain.   sitaGLIPtin (JANUVIA) 100 MG tablet Take 1 tablet (100 mg total) by mouth daily.   testosterone cypionate (DEPOTESTOSTERONE CYPIONATE) 200 MG/ML injection Inject 200 mg into the muscle every 14 (fourteen) days.   valsartan (DIOVAN) 320 MG tablet TAKE ONE TABLET BY MOUTH DAILY (Patient taking differently: Take 160 mg by mouth daily.)   XARELTO 20 MG  TABS tablet TAKE ONE TABLET BY MOUTH DAILY WITH SUPPER   [DISCONTINUED] FARXIGA 10 MG TABS tablet TAKE ONE TABLET BY MOUTH DAILY BEFORE BREAKFAST   [DISCONTINUED] glucose blood (ACCU-CHEK GUIDE) test strip Use as instructed to monitor glucose twice daily   [DISCONTINUED] metFORMIN (GLUCOPHAGE-XR) 500 MG 24 hr tablet Take 500 mg by mouth daily with breakfast.   [  DISCONTINUED] sitaGLIPtin (JANUVIA) 100 MG tablet Take 1 tablet (100 mg total) by mouth daily.   No facility-administered encounter medications on file as of 10/28/2021.    ALLERGIES: Allergies  Allergen Reactions   Lisinopril Other (See Comments)    wheezing   Bystolic [Nebivolol Hcl] Swelling   Ibuprofen Other (See Comments)    Rectal bleed   Spironolactone Hives   Trulicity [Dulaglutide]     Sob, dry cough, GI   Amlodipine Itching and Other (See Comments)    Leg pain.   Diflucan [Fluconazole] Rash   Hctz [Hydrochlorothiazide] Other (See Comments)    Caused gout flares.   Losartan Rash    VACCINATION STATUS: Immunization History  Administered Date(s) Administered   Influenza,inj,Quad PF,6+ Mos 02/01/2018, 01/07/2019, 12/12/2019, 02/11/2021   Moderna Sars-Covid-2 Vaccination 08/22/2019, 09/19/2019    Diabetes He presents for his follow-up diabetic visit. He has type 2 diabetes mellitus. Onset time: prediabetes for years, DM diagnosis at age 10. His disease course has been improving. There are no hypoglycemic associated symptoms. Associated symptoms include blurred vision, fatigue, polydipsia and polyuria. There are no hypoglycemic complications. Symptoms are improving. Diabetic complications include a CVA, heart disease and nephropathy. Risk factors for coronary artery disease include diabetes mellitus, dyslipidemia, family history, male sex, hypertension, obesity and sedentary lifestyle. Current diabetic treatment includes oral agent (triple therapy). He is compliant with treatment most of the time. His weight is decreasing  steadily. He is following a generally unhealthy diet. When asked about meal planning, he reported none. He has not had a previous visit with a dietitian. He rarely participates in exercise. His home blood glucose trend is decreasing steadily. His overall blood glucose range is 180-200 mg/dl. (He presents today with his meter, no logs, showing improved glycemic profile overall, yet still above target.  His POCT A1c today is 9.9%, improving from last visit of 10.1%.  He says he has been cutting down portion sizes and limiting his soda intake.  He has appt with Jearld Fenton for diet education coming up next week but is asking if he could see her today since he is already here.  Analysis of his meter shows 7-day average of 190, 14-day average of 196, 30-day average of 223, and 90-day average of 238.) An ACE inhibitor/angiotensin II receptor blocker is being taken. He does not see a podiatrist.Eye exam is current.     Review of systems  Constitutional: + Minimally fluctuating body weight, current Body mass index is 52.08 kg/m., + fatigue, no subjective hyperthermia, no subjective hypothermia Eyes: + blurry vision, no xerophthalmia ENT: no sore throat, no nodules palpated in throat, no dysphagia/odynophagia, no hoarseness Cardiovascular: no chest pain, no shortness of breath, no palpitations, no leg swelling Respiratory: no cough, no shortness of breath Gastrointestinal: no nausea/vomiting/diarrhea Musculoskeletal: + diffuse muscle/joint aches Skin: no rashes, no hyperemia Neurological: no tremors, no numbness, no tingling, no dizziness Psychiatric: no depression, no anxiety  Objective:     BP 125/82   Pulse (!) 53   Ht 6' (1.829 m)   Wt (!) 384 lb (174.2 kg)   BMI 52.08 kg/m   Wt Readings from Last 3 Encounters:  10/28/21 (!) 384 lb (174.2 kg)  10/28/21 (!) 384 lb (174.2 kg)  10/25/21 (!) 392 lb 9.6 oz (178.1 kg)     BP Readings from Last 3 Encounters:  10/28/21 125/82  10/25/21  138/86  09/23/21 104/64      Physical Exam- Limited  Constitutional:  Body mass index is 52.08  kg/m. , not in acute distress, normal state of mind Eyes:  EOMI, no exophthalmos Neck: Supple Cardiovascular: RRR, no murmurs, rubs, or gallops, no edema Respiratory: Adequate breathing efforts, no crackles, rales, rhonchi, or wheezing Musculoskeletal: no gross deformities, strength intact in all four extremities, no gross restriction of joint movements Skin:  no rashes, no hyperemia Neurological: no tremor with outstretched hands    CMP ( most recent) CMP     Component Value Date/Time   NA 137 08/18/2021 1412   K 4.5 08/18/2021 1412   CL 94 (L) 08/18/2021 1412   CO2 26 08/18/2021 1412   GLUCOSE 375 (H) 08/18/2021 1412   GLUCOSE 141 (H) 04/10/2019 1314   BUN 16 08/18/2021 1412   CREATININE 1.47 (H) 08/18/2021 1412   CALCIUM 9.5 08/18/2021 1412   PROT 7.1 08/18/2021 1412   ALBUMIN 4.1 08/18/2021 1412   AST 19 08/18/2021 1412   ALT 26 08/18/2021 1412   ALKPHOS 86 08/18/2021 1412   BILITOT 0.5 08/18/2021 1412   GFRNONAA 56 12/16/2019 0000   GFRAA 68 12/16/2019 0000     Diabetic Labs (most recent): Lab Results  Component Value Date   HGBA1C 9.9 10/28/2021   HGBA1C 10.1 (H) 08/18/2021   HGBA1C 9.0 04/29/2021   MICROALBUR 30 10/28/2021     Lipid Panel ( most recent) Lipid Panel     Component Value Date/Time   CHOL 140 08/18/2021 1412   TRIG 179 (H) 08/18/2021 1412   HDL 31 (L) 08/18/2021 1412   CHOLHDL 4.5 08/18/2021 1412   CHOLHDL 5.1 01/20/2018 0209   VLDL 26 01/20/2018 0209   LDLCALC 78 08/18/2021 1412   LABVLDL 31 08/18/2021 1412      Lab Results  Component Value Date   TSH 2.69 04/29/2021   TSH 2.840 05/13/2019   TSH 2.310 10/17/2018           Assessment & Plan:   1) Type 2 diabetes mellitus with stage 3a chronic kidney disease, without long-term current use of insulin ([place])  He presents today with his meter, no logs, showing improved glycemic  profile overall, yet still above target.  His POCT A1c today is 9.9%, improving from last visit of 10.1%.  He says he has been cutting down portion sizes and limiting his soda intake.  He has appt with Jearld Fenton for diet education coming up next week but is asking if he could see her today since he is already here.  Analysis of his meter shows 7-day average of 190, 14-day average of 196, 30-day average of 223, and 90-day average of 238.  - Bradley Torres has currently uncontrolled symptomatic type 2 DM since 61 years of age.   -Recent labs reviewed.  - I had a long discussion with him about the progressive nature of diabetes and the pathology behind its complications. -his diabetes is complicated by CAD, CVA, MI, CKD and he remains at a high risk for more acute and chronic complications which include CAD, CVA, CKD, retinopathy, and neuropathy. These are all discussed in detail with him.  The following Lifestyle Medicine recommendations according to [facility]) were discussed and offered to patient and he agrees to start the journey:  A. Whole Foods, Plant-based plate comprising of fruits and vegetables, plant-based proteins, whole-grain carbohydrates was discussed in detail with the patient.   A list for source of those nutrients were also provided to the patient.  Patient will use only water or unsweetened tea for  hydration. B.  The need to stay away from risky substances including alcohol, smoking; obtaining 7 to 9 hours of restorative sleep, at least 150 minutes of moderate intensity exercise weekly, the importance of healthy social connections,  and stress reduction techniques were discussed. C.  A full color page of  Calorie density of various food groups per pound showing examples of each food groups was provided to the patient.  - Nutritional counseling repeated at each appointment due to patients tendency to fall back in to old habits.  - The patient  admits there is a room for improvement in their diet and drink choices. -  Suggestion is made for the patient to avoid simple carbohydrates from their diet including Cakes, Sweet Desserts / Pastries, Ice Cream, Soda (diet and regular), Sweet Tea, Candies, Chips, Cookies, Sweet Pastries, Store Bought Juices, Alcohol in Excess of 1-2 drinks a day, Artificial Sweeteners, Coffee Creamer, and "Sugar-free" Products. This will help patient to have stable blood glucose profile and potentially avoid unintended weight gain.   - I encouraged the patient to switch to unprocessed or minimally processed complex starch and increased protein intake (animal or plant source), fruits, and vegetables.   - Patient is advised to stick to a routine mealtimes to eat 3 meals a day and avoid unnecessary snacks (to snack only to correct hypoglycemia).  - he is scheduled with Jearld Fenton, RDN, CDE for diabetes education, sees her after his appt with me today.  - I have approached him with the following individualized plan to manage his diabetes and patient agrees:   -he is encouraged to start monitoring glucose twice daily, before breakfast and before bed, to log their readings on the clinic sheets provided, and bring them to review at follow up appointment in 1 month.  - Adjustment parameters are given to him for hypo and hyperglycemia in writing. - he is encouraged to call clinic for blood glucose levels less than 70 or above 300 mg /dl. - he is advised to continue Metformin 500 mg ER daily with breakfast, Januvia 100 mg po daily and Farxiga 10 mg po daily, therapeutically suitable for patient.  Insulin may be needed in the future to control his glucose.  - he is not a candidate for full dose Metformin due to concurrent renal insufficiency.  -He had been on Trulicity in the past which caused SOB, cough, throat swelling.  Will avoid this class of meds moving forward.  - Specific targets for  A1c; LDL, HDL, and  Triglycerides were discussed with the patient.  2) Blood Pressure /Hypertension:  his blood pressure is controlled to target.   he is advised to continue his current medications including Diovan 320 mg p.o. daily with breakfast, Hydralazine 50 mg po TID, and Chlorthalidone 12.5 mg po daily.  3) Lipids/Hyperlipidemia:    Review of his recent lipid panel from 08/18/21 showed controlled LDL at 79 and elevated triglycerides of 179 .  he is advised to continue Lipitor 20 mg daily at bedtime.  Side effects and precautions discussed with him.  4)  Weight/Diet:  his Body mass index is 52.08 kg/m.  -  clearly complicating his diabetes care.   he is a candidate for weight loss. I discussed with him the fact that loss of 5 - 10% of his  current body weight will have the most impact on his diabetes management.  Exercise, and detailed carbohydrates information provided  -  detailed on discharge instructions.  5) Chronic Care/Health Maintenance: -he  is on ACEI/ARB and Statin medications and is encouraged to initiate and continue to follow up with Ophthalmology, Dentist, Podiatrist at least yearly or according to recommendations, and advised to stay away from smoking. I have recommended yearly flu vaccine and pneumonia vaccine at least every 5 years; moderate intensity exercise for up to 150 minutes weekly; and sleep for at least 7 hours a day.  - he is advised to maintain close follow up with [name], [name] for primary care needs, as well as his other providers for optimal and coordinated care.     I spent 30 minutes in the care of the patient today including review of labs from [facility], Lipids, Thyroid Function, Hematology (current and previous including abstractions from other facilities); face-to-face time discussing  his blood glucose readings/logs, discussing hypoglycemia and hyperglycemia episodes and symptoms, medications doses, his options of short and long term treatment based on the latest  standards of care / guidelines;  discussion about incorporating lifestyle medicine;  and documenting the encounter. Risk reduction counseling performed per USPSTF guidelines to reduce obesity and cardiovascular risk factors.     Please refer to Patient Instructions for Blood Glucose Monitoring and Insulin/Medications Dosing Guide"  in media tab for additional information. Please  also refer to " Patient Self Inventory" in the Media  tab for reviewed elements of pertinent patient history.  Bradley Torres participated in the discussions, expressed understanding, and voiced agreement with the above plans.  All questions were answered to his satisfaction. he is encouraged to contact clinic should he have any questions or concerns prior to his return visit.     Follow up plan: - Return in about 3 months (around 01/28/2022) for Diabetes F/U with A1c in office, Previsit labs, Bring meter and logs.    Bradley Torres, [facility] [address] [place], [place] [address] Phone: [phone] Fax: [phone]  10/28/2021, 4:26 PM

## 2021-10-28 NOTE — Patient Instructions (Signed)

## 2021-10-28 NOTE — Progress Notes (Signed)
Medical Nutrition Therapy  Appointment Start time:  4098  Appointment End time:  1600  Primary concerns today: DM Type 2, Obesity  Referral diagnosis: E11.8, E66.01 Preferred learning style: See and hear  Learning readiness: change   NUTRITION ASSESSMENT  13 y old bmale who doesn't read or write. Sees Rayetta Pigg, [name] for his DM.  Since seeing her, he has cut out sodas, sweets and junk food. Sees Rayetta Pigg, [name] for his DM.  Drinking more water. Does drink sodas and sweet tea on occasion. Has cut out bacon, sausage and biscuits A1C 8,8%.  Metformin 500  ER mg once a day. Januvia 100 mg a day Farxiga 10 mg a day.   FBS: 150-160'  Bedtime: 150-250's. Lab Results  Component Value Date   HGBA1C 9.9 10/28/2021    Anthropometrics  Wt Readings from Last 3 Encounters:  10/28/21 (!) 384 lb (174.2 kg)  10/25/21 (!) 392 lb 9.6 oz (178.1 kg)  09/23/21 (!) 388 lb (176 kg)   Ht Readings from Last 3 Encounters:  10/28/21 6' (1.829 m)  10/25/21 6' (1.829 m)  09/23/21 '6\' 1"'$  (1.854 m)   There is no height or weight on file to calculate BMI. '@BMIFA'$ @ Facility age limit for growth %iles is 20 years. Facility age limit for growth %iles is 20 years.    Clinical Medical Hx: see chart Medications: Farxiga 10 mg, 500 mg per day., Januvia Labs:  Lab Results  Component Value Date   HGBA1C 9.9 10/28/2021      Latest Ref Rng & Units 08/18/2021    2:12 PM 04/29/2021   12:00 AM 02/11/2021   10:25 AM  CMP  Glucose 70 - 99 mg/dL 375   320   BUN 6 - 24 mg/dL 16   18   Creatinine 0.76 - 1.27 mg/dL 1.47   1.41   Sodium 134 - 144 mmol/L 137   138   Potassium 3.5 - 5.2 mmol/L 4.5  4.1     4.0   Chloride 96 - 106 mmol/L 94  98     95   CO2 20 - 29 mmol/L '26  29     27   '$ Calcium 8.7 - 10.2 mg/dL 9.5   9.5   Total Protein 6.0 - 8.5 g/dL 7.1   6.8   Total Bilirubin 0.0 - 1.2 mg/dL 0.5   0.4   Alkaline Phos 44 - 121 IU/L 86  73     90   AST 0 - 40 IU/L '19  19     15   '$ ALT 0 - 44 IU/L '26   18     22      '$ This result is from an external source.   Lipid Panel     Component Value Date/Time   CHOL 140 08/18/2021 1412   TRIG 179 (H) 08/18/2021 1412   HDL 31 (L) 08/18/2021 1412   CHOLHDL 4.5 08/18/2021 1412   CHOLHDL 5.1 01/20/2018 0209   VLDL 26 01/20/2018 0209   LDLCALC 78 08/18/2021 1412   LABVLDL 31 08/18/2021 1412    Notable Signs/Symptoms: Increased thirst, fatigue  Lifestyle & Dietary Hx Lives by himself Eats most meals at home. Does't cook. 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   Estimated daily fluid intake: 60 oz Supplements:  sleep: 6-7 hrs Stress / self-care:  Current average weekly physical activity: ADL  24-Hr Dietary Recall  Eats 2-3 meals per day. Doesn't cook.  Eats at his brothers restaurant.. Drinks  water. Sometimes tea or soda on occasion.  Estimated Energy Needs Calories: 1500 Carbohydrate: 170  Protein: 112g Fat: 42g   NUTRITION DIAGNOSIS  NB-1.1 Food and nutrition-related knowledge deficit As related to Diabetes Type 2.  As evidenced by A1C 9.9%.   NUTRITION INTERVENTION  Nutrition education (E-1) on the following topics:  Lifestyle Medicine  - Whole Food, Plant Predominant Nutrition is highly recommended: Eat Plenty of vegetables, Mushrooms, fruits, Legumes, Whole Grains, Nuts, seeds in lieu of processed meats, processed snacks/pastries red meat, poultry, eggs.    -It is better to avoid simple carbohydrates including: Cakes, Sweet Desserts, Ice Cream, Soda (diet and regular), Sweet Tea, Candies, Chips, Cookies, Store Bought Juices, Alcohol in Excess of  1-2 drinks a day, Lemonade,  Artificial Sweeteners, Doughnuts, Coffee Creamers, "Sugar-free" Products, etc, etc.  This is not a complete list.....  Exercise: If you are able: 30 -60 minutes a day ,4 days a week, or 150 minutes a week.  The longer the better.  Combine stretch, strength, and aerobic activities.  If you were told in the past that you have high risk for  cardiovascular diseases, you may seek evaluation by your heart doctor prior to initiating moderate to intense exercise programs.   Handouts Provided Include  Lifestyle medicine  visual plate handout  Learning Style & Readiness for Change Teaching method utilized: Visual & Auditory  Demonstrated degree of understanding via: Teach Back  Barriers to learning/adherence to lifestyle change: Reading and writing. Not being able to cook  Goals Established by Pt Eat 3 meals per day Take medications as prescribed. Avoid snacks between meals Don't eat after 7 pm. Eat more fruits, vegetables and grains Cut out fried and processed foods. Get A1C down to 7%    MONITORING & EVALUATION Dietary intake, weekly physical activity, and weight and blood sugars in 1 month.  Next Steps  Patient is to work on choosing better food choices at meal time.Marland Kitchen

## 2021-10-29 DIAGNOSIS — Z6841 Body Mass Index (BMI) 40.0 and over, adult: Secondary | ICD-10-CM | POA: Diagnosis not present

## 2021-10-29 DIAGNOSIS — M539 Dorsopathy, unspecified: Secondary | ICD-10-CM | POA: Diagnosis not present

## 2021-10-29 DIAGNOSIS — Z79899 Other long term (current) drug therapy: Secondary | ICD-10-CM | POA: Diagnosis not present

## 2021-10-29 DIAGNOSIS — E1165 Type 2 diabetes mellitus with hyperglycemia: Secondary | ICD-10-CM | POA: Diagnosis not present

## 2021-10-29 DIAGNOSIS — E291 Testicular hypofunction: Secondary | ICD-10-CM | POA: Diagnosis not present

## 2021-10-29 DIAGNOSIS — R030 Elevated blood-pressure reading, without diagnosis of hypertension: Secondary | ICD-10-CM | POA: Diagnosis not present

## 2021-11-01 ENCOUNTER — Encounter: Payer: Medicaid Other | Admitting: Nutrition

## 2021-11-09 ENCOUNTER — Ambulatory Visit (INDEPENDENT_AMBULATORY_CARE_PROVIDER_SITE_OTHER): Payer: Medicaid Other | Admitting: Family Medicine

## 2021-11-09 ENCOUNTER — Encounter: Payer: Self-pay | Admitting: Family Medicine

## 2021-11-09 DIAGNOSIS — L0292 Furuncle, unspecified: Secondary | ICD-10-CM | POA: Diagnosis not present

## 2021-11-09 DIAGNOSIS — B372 Candidiasis of skin and nail: Secondary | ICD-10-CM

## 2021-11-09 MED ORDER — CEPHALEXIN 500 MG PO CAPS: 500.0000 mg | ORAL_CAPSULE | Freq: Two times a day (BID) | ORAL | 0 refills | Status: AC

## 2021-11-09 MED ORDER — NYSTATIN 100000 UNIT/GM EX POWD: 1.0000 | Freq: Two times a day (BID) | CUTANEOUS | 0 refills | Status: DC

## 2021-11-09 NOTE — Progress Notes (Signed)
Virtual Visit via Telephone Note  I connected with Bradley Torres on 11/09/21 at 3:55 PM by telephone and verified that I am speaking with the correct person using two identifiers. Bradley Torres is currently located at home and nobody is currently with him during this visit. The provider, [name], [name] is located in their office at time of visit.  I discussed the limitations, risks, security and privacy concerns of performing an evaluation and management service by telephone and the availability of in person appointments. I also discussed with the patient that there may be a patient responsible charge related to this service. The patient expressed understanding and agreed to proceed.  Subjective: PCP: [name], [name]  Chief Complaint  Patient presents with   boils   Patient reports a boil on his right side, right finger, chin, and above his "[place]".  States they are draining pus.  He was treated for numerous boils last month which she reports did resolve and none of his current ones are in the same areas.  Also reports the crease of his leg is raw on the right side.   ROS: Per HPI  Current Outpatient Medications:    Accu-Chek Softclix Lancets lancets, Use as instructed to monitor glucose twice daily, Disp: 100 each, Rfl: 12   allopurinol (ZYLOPRIM) 100 MG tablet, Take 1 tablet (100 mg total) by mouth daily., Disp: 90 tablet, Rfl: 1   atorvastatin (LIPITOR) 20 MG tablet, Take 1 tablet (20 mg total) by mouth daily., Disp: 90 tablet, Rfl: 3   chlorthalidone (HYGROTON) 25 MG tablet, Take 1 tablet (25 mg total) by mouth every other day., Disp: 45 tablet, Rfl: 3   Cholecalciferol (VITAMIN D3) 250 MCG (10000 UT) capsule, Take 10,000 Units by mouth daily., Disp: , Rfl:    dapagliflozin propanediol (FARXIGA) 10 MG TABS tablet, TAKE ONE TABLET BY MOUTH DAILY BEFORE BREAKFAST, Disp: 90 tablet, Rfl: 3   DHEA 25 MG CAPS, Take by mouth daily., Disp: , Rfl:    gabapentin  (NEURONTIN) 300 MG capsule, Take 1 capsule by mouth daily as needed (pain)., Disp: , Rfl:    glucose blood (ACCU-CHEK GUIDE) test strip, Use as instructed to monitor glucose twice daily, Disp: 100 each, Rfl: 12   hydrALAZINE (APRESOLINE) 50 MG tablet, Take 1 tablet (50 mg total) by mouth 3 (three) times daily., Disp: 90 tablet, Rfl: 1   metFORMIN (GLUCOPHAGE-XR) 500 MG 24 hr tablet, Take 1 tablet (500 mg total) by mouth daily with breakfast., Disp: 90 tablet, Rfl: 3   methocarbamol (ROBAXIN) 500 MG tablet, Take 1 tablet (500 mg total) by mouth every 8 (eight) hours as needed for muscle spasms., Disp: 60 tablet, Rfl: 1   oxyCODONE-acetaminophen (PERCOCET) 10-325 MG tablet, Take 1 tablet by mouth 3 (three) times daily as needed for pain., Disp: , Rfl:    sitaGLIPtin (JANUVIA) 100 MG tablet, Take 1 tablet (100 mg total) by mouth daily., Disp: 90 tablet, Rfl: 3   testosterone cypionate (DEPOTESTOSTERONE CYPIONATE) 200 MG/ML injection, Inject 200 mg into the muscle every 14 (fourteen) days., Disp: , Rfl:    valsartan (DIOVAN) 320 MG tablet, TAKE ONE TABLET BY MOUTH DAILY (Patient taking differently: Take 160 mg by mouth daily.), Disp: 30 tablet, Rfl: 6   XARELTO 20 MG TABS tablet, TAKE ONE TABLET BY MOUTH DAILY WITH SUPPER, Disp: 90 tablet, Rfl: 0  Allergies  Allergen Reactions   Lisinopril Other (See Comments)    wheezing   Bystolic [  Nebivolol Hcl] Swelling   Ibuprofen Other (See Comments)    Rectal bleed   Spironolactone Hives   Trulicity [Dulaglutide]     Sob, dry cough, GI   Amlodipine Itching and Other (See Comments)    Leg pain.   Diflucan [Fluconazole] Rash   Hctz [Hydrochlorothiazide] Other (See Comments)    Caused gout flares.   Losartan Rash   Past Medical History:  Diagnosis Date   CAD (coronary artery disease)    a. 12/2017: cath showing 10% Proximal-LAD stenosis with no significant obstructive disease and LVEDP mildly elevated at 18 mm Hg.    Chronic back pain    Diabetes  mellitus, type 2 (HCC)    DVT (deep venous thrombosis) ([place])    Essential hypertension    MI (myocardial infarction) ([place]) 2019   Morbid obesity ([place])    Panic disorder    Shoulder pain, left    Following fall   Sleep apnea    Stroke ([place]) 01/2018   Vitamin D deficiency     Observations/Objective: A&O  No respiratory distress or wheezing audible over the phone Mood, judgement, and thought processes all WNL  Assessment and Plan: 1. Candidal intertrigo - nystatin (MYCOSTATIN/NYSTOP) powder; Apply 1 Application topically 2 (two) times daily.  Dispense: 60 g; Refill: 0  2. Boils of multiple sites - cephALEXin (KEFLEX) 500 MG capsule; Take 1 capsule (500 mg total) by mouth 2 (two) times daily for 7 days.  Dispense: 14 capsule; Refill: 0   Follow Up Instructions: Return in about 3 months (around 02/09/2022) for annual physical with Dr. [name].  I discussed the assessment and treatment plan with the patient. The patient was provided an opportunity to ask questions and all were answered. The patient agreed with the plan and demonstrated an understanding of the instructions.   The patient was advised to call back or seek an in-person evaluation if the symptoms worsen or if the condition fails to improve as anticipated.  The above assessment and management plan was discussed with the patient. The patient verbalized understanding of and has agreed to the management plan. Patient is aware to call the clinic if symptoms persist or worsen. Patient is aware when to return to the clinic for a follow-up visit. Patient educated on when it is appropriate to go to the emergency department.   Time call ended: 4:06 PM  I provided 11 minutes of non-face-to-face time during this encounter.  Hendricks Limes, MSN, APRN, [name]-C [facility] 11/09/21

## 2021-11-23 ENCOUNTER — Encounter: Payer: Self-pay | Admitting: Nutrition

## 2021-11-25 ENCOUNTER — Other Ambulatory Visit: Payer: Self-pay | Admitting: Family Medicine

## 2021-11-25 DIAGNOSIS — Z86711 Personal history of pulmonary embolism: Secondary | ICD-10-CM

## 2021-11-30 DIAGNOSIS — Z79899 Other long term (current) drug therapy: Secondary | ICD-10-CM | POA: Diagnosis not present

## 2021-11-30 DIAGNOSIS — E2740 Unspecified adrenocortical insufficiency: Secondary | ICD-10-CM | POA: Diagnosis not present

## 2021-11-30 DIAGNOSIS — Z6841 Body Mass Index (BMI) 40.0 and over, adult: Secondary | ICD-10-CM | POA: Diagnosis not present

## 2021-11-30 DIAGNOSIS — R030 Elevated blood-pressure reading, without diagnosis of hypertension: Secondary | ICD-10-CM | POA: Diagnosis not present

## 2021-11-30 DIAGNOSIS — E119 Type 2 diabetes mellitus without complications: Secondary | ICD-10-CM | POA: Diagnosis not present

## 2021-11-30 DIAGNOSIS — M539 Dorsopathy, unspecified: Secondary | ICD-10-CM | POA: Diagnosis not present

## 2021-11-30 DIAGNOSIS — E291 Testicular hypofunction: Secondary | ICD-10-CM | POA: Diagnosis not present

## 2021-11-30 DIAGNOSIS — N184 Chronic kidney disease, stage 4 (severe): Secondary | ICD-10-CM | POA: Diagnosis not present

## 2021-12-03 ENCOUNTER — Ambulatory Visit (INDEPENDENT_AMBULATORY_CARE_PROVIDER_SITE_OTHER): Payer: Medicaid Other | Admitting: Family Medicine

## 2021-12-03 ENCOUNTER — Encounter: Payer: Self-pay | Admitting: Family Medicine

## 2021-12-03 VITALS — BP 142/77 | HR 71 | Temp 98.3°F | Ht 72.0 in | Wt 387.0 lb

## 2021-12-03 DIAGNOSIS — L03317 Cellulitis of buttock: Secondary | ICD-10-CM | POA: Diagnosis not present

## 2021-12-03 DIAGNOSIS — L0293 Carbuncle, unspecified: Secondary | ICD-10-CM

## 2021-12-03 DIAGNOSIS — L0231 Cutaneous abscess of buttock: Secondary | ICD-10-CM

## 2021-12-03 MED ORDER — DOXYCYCLINE HYCLATE 100 MG PO TABS: 100.0000 mg | ORAL_TABLET | Freq: Two times a day (BID) | ORAL | 0 refills | Status: AC

## 2021-12-03 NOTE — Patient Instructions (Signed)
Skin Abscess  A skin abscess is an infected area on or under your skin that contains a collection of pus and other material. An abscess may also be called a furuncle, carbuncle, or boil. An abscess can occur in or on almost any part of your body. Some abscesses break open (rupture) on their own. Most continue to get worse unless they are treated. The infection can spread deeper into the body and eventually into your blood, which can make you feel ill. Treatment usually involves draining the abscess. What are the causes? An abscess occurs when germs, like bacteria, pass through your skin and cause an infection. This may be caused by: A scrape or cut on your skin. A puncture wound through your skin, including a needle injection or insect bite. Blocked oil or sweat glands. Blocked and infected hair follicles. A cyst that forms beneath your skin (sebaceous cyst) and becomes infected. What increases the risk? This condition is more likely to develop in people who: Have a weak body defense system (immune system). Have diabetes. Have dry and irritated skin. Get frequent injections or use illegal IV drugs. Have a foreign body in a wound, such as a splinter. Have problems with their lymph system or veins. What are the signs or symptoms? Symptoms of this condition include: A painful, firm bump under the skin. A bump with pus at the top. This may break through the skin and drain. Other symptoms include: Redness surrounding the abscess site. Warmth. Swelling of the lymph nodes (glands) near the abscess. Tenderness. A sore on the skin. How is this diagnosed? This condition may be diagnosed based on: A physical exam. Your medical history. A sample of pus. This may be used to find out what is causing the infection. Blood tests. Imaging tests, such as an ultrasound, CT scan, or MRI. How is this treated? A small abscess that drains on its own may not need treatment. Treatment for larger abscesses  may include: Moist heat or heat pack applied to the area several times a day. A procedure to drain the abscess (incision and drainage). Antibiotic medicines. For a severe abscess, you may first get antibiotics through an IV and then change to antibiotics by mouth. Follow these instructions at home: Medicines  Take over-the-counter and prescription medicines only as told by your health care provider. If you were prescribed an antibiotic medicine, take it as told by your health care provider. Do not stop taking the antibiotic even if you start to feel better. Abscess care  If you have an abscess that has not drained, apply heat to the affected area. Use the heat source that your health care provider recommends, such as a moist heat pack or a heating pad. Place a towel between your skin and the heat source. Leave the heat on for 20-30 minutes. Remove the heat if your skin turns bright red. This is especially important if you are unable to feel pain, heat, or cold. You may have a greater risk of getting burned. Follow instructions from your health care provider about how to take care of your abscess. Make sure you: Cover the abscess with a bandage (dressing). Change your dressing or gauze as told by your health care provider. Wash your hands with soap and water before you change the dressing or gauze. If soap and water are not available, use hand sanitizer. Check your abscess every day for signs of a worsening infection. Check for: More redness, swelling, or pain. More fluid or blood. Warmth.   More pus or a bad smell. General instructions To avoid spreading the infection: Do not share personal care items, towels, or hot tubs with others. Avoid making skin contact with other people. Keep all follow-up visits as told by your health care provider. This is important. Contact a health care provider if you have: More redness, swelling, or pain around your abscess. More fluid or blood coming from  your abscess. Warm skin around your abscess. More pus or a bad smell coming from your abscess. Muscle aches. Chills or a general ill feeling. Get help right away if you: Have severe pain. See red streaks on your skin spreading away from the abscess. See redness that spreads quickly. Have a fever or chills. Summary A skin abscess is an infected area on or under your skin that contains a collection of pus and other material. A small abscess that drains on its own may not need treatment. Treatment for larger abscesses may include having a procedure to drain the abscess and taking an antibiotic. This information is not intended to replace advice given to you by your health care provider. Make sure you discuss any questions you have with your health care provider. Document Revised: 06/17/2021 Document Reviewed: 12/21/2020 Elsevier Patient Education  2023 Elsevier Inc.  

## 2021-12-03 NOTE — Progress Notes (Signed)
   Acute Office Visit  Subjective:     Patient ID: Bradley Torres, male    DOB: 1960-07-18, 61 y.o.   MRN: [id number]  Chief Complaint  Patient presents with   Recurrent Skin Infections    HPI Patient is in today for an abscess to his left buttocks. He noticed this yesterday. It is large, red, and tender. Denies, fever, drainage, or chills. For the last few months he has had recurrent boils in his groin area. They heal and then new ones come up. He has recently been treated with keflex and augmentin.   ROS As per HPI.      Objective:    BP (!) 142/77   Pulse 71   Temp 98.3 F (36.8 C) (Temporal)   Ht 6' (1.829 m)   Wt (!) 387 lb (175.5 kg)   SpO2 94%   BMI 52.49 kg/m    Physical Exam Vitals and nursing note reviewed.  Constitutional:      General: He is not in acute distress.    Appearance: He is not ill-appearing, toxic-appearing or diaphoretic.  HENT:     Head: Normocephalic and atraumatic.  Skin:    General: Skin is warm and dry.     Findings: Abscess present.     Comments: 9 cm x 4 cm area of induration to left lower buttocks/upper thigh. Warmth, erythema present. Center area of ulceration about 1 cm. No fluctuance or drainage.   Neurological:     General: No focal deficit present.     Mental Status: He is alert and oriented to person, place, and time.  Psychiatric:        Mood and Affect: Mood normal.        Behavior: Behavior normal.     No results found for any visits on 12/03/21.      Assessment & Plan:   Bradley Torres was seen today for recurrent skin infections.  Diagnoses and all orders for this visit:  Cellulitis and abscess of buttock Doxycyline as below. Warm compresses. Follow up in 1 weeks, sooner for new or worsening symptoms.  -     doxycycline (VIBRA-TABS) 100 MG tablet; Take 1 tablet (100 mg total) by mouth 2 (two) times daily for 7 days. 1 po bid  Recurrent boils Referral placed for recurrent boils over the last few months.  -      Ambulatory referral to Dermatology  The patient indicates understanding of these issues and agrees with the plan.  Gwenlyn Perking, [name]

## 2021-12-10 ENCOUNTER — Encounter: Payer: Self-pay | Admitting: Family Medicine

## 2021-12-10 ENCOUNTER — Ambulatory Visit: Payer: Medicaid Other | Admitting: Family Medicine

## 2021-12-10 VITALS — BP 135/80 | HR 68 | Temp 98.5°F | Ht 72.0 in | Wt 383.0 lb

## 2021-12-10 DIAGNOSIS — L0231 Cutaneous abscess of buttock: Secondary | ICD-10-CM | POA: Diagnosis not present

## 2021-12-10 MED ORDER — SULFAMETHOXAZOLE-TRIMETHOPRIM 800-160 MG PO TABS: 1.0000 | ORAL_TABLET | Freq: Two times a day (BID) | ORAL | 0 refills | Status: AC

## 2021-12-10 NOTE — Progress Notes (Signed)
Subjective:  Patient ID: Bradley Torres, male    DOB: 03/17/1961, 61 y.o.   MRN: [id number]  Patient Care Team: [name], [name] as PCP - General (Family Medicine) Skeet Latch, MD as PCP - Cardiology (Cardiology)   Chief Complaint:  abscess recheck   HPI: Bradley Torres is a 61 y.o. male presenting on 12/10/2021 for abscess recheck   Pt presents today for reevaluation of abscess to left buttock. He was seen in office on 12/03/2021 and treated with doxycycline. He has completed antibiotic therapy and reports area has improved greatly. No fever, chills, weakness, confusion, or fatigue.      Relevant past medical, surgical, family, and social history reviewed and updated as indicated.  Allergies and medications reviewed and updated. Data reviewed: Chart in Epic.   Past Medical History:  Diagnosis Date   CAD (coronary artery disease)    a. 12/2017: cath showing 10% Proximal-LAD stenosis with no significant obstructive disease and LVEDP mildly elevated at 18 mm Hg.    Chronic back pain    Diabetes mellitus, type 2 (HCC)    DVT (deep venous thrombosis) (HCC)    Essential hypertension    MI (myocardial infarction) ([place]) 2019   Morbid obesity ([place])    Panic disorder    Shoulder pain, left    Following fall   Sleep apnea    Stroke ([place]) 01/2018   Vitamin D deficiency     Past Surgical History:  Procedure Laterality Date   Ankle fracture sugery Left    BIOPSY  07/20/2020   Procedure: BIOPSY;  Surgeon: Daneil Dolin, MD;  [facility];  Service: Endoscopy;;  cecal polyp   COLONOSCOPY WITH PROPOFOL N/A 05/29/2017   Dr. [name]: Multiple tubular adenomas removed, prep inadequate.  Short interval surveillance colonoscopy recommended in 6 months   COLONOSCOPY WITH PROPOFOL N/A 07/20/2020   Procedure: COLONOSCOPY WITH PROPOFOL;  Surgeon: Daneil Dolin, MD;  [facility]: [facility];  Service: Endoscopy;  Laterality: N/A;  am appt, early as possible since  diabetic   LEFT HEART CATH AND CORONARY ANGIOGRAPHY N/A 01/23/2018   Procedure: LEFT HEART CATH AND CORONARY ANGIOGRAPHY;  Surgeon: Troy Sine, MD;  Location: [facility];  Service: Cardiovascular;  Laterality: N/A;   LUMBAR SPINE SURGERY     fusion   POLYPECTOMY  05/29/2017   Procedure: POLYPECTOMY;  Surgeon: Daneil Dolin, MD;  Location: [facility];  Service: Endoscopy;;  ascending colon polyp x5-cs transverse colon polyp x4- cs descending colon polyp x1- hs sigmoid colon polyp x1 -hs rectal polyp x1- hs   TONSILLECTOMY      Social History   Socioeconomic History   Marital status: Single    Spouse name: Not on file   Number of children: Not on file   Years of education: Not on file   Highest education level: Not on file  Occupational History   Occupation: disabled  Tobacco Use   Smoking status: Never   Smokeless tobacco: Never  Vaping Use   Vaping Use: Never used  Substance and Sexual Activity   Alcohol use: Yes    Comment: occ   Drug use: Not Currently    Types: Marijuana    Comment: daily; 05/19/20 no marijuana in past 6 months d/t going to pain management clinic   Sexual activity: Not Currently    Birth control/protection: None  Other Topics Concern   Not on file  Social History Narrative  Not on file   Social Determinants of Health   Financial Resource Strain: Medium Risk (05/18/2020)   Overall Financial Resource Strain (CARDIA)    Difficulty of Paying Living Expenses: Somewhat hard  Food Insecurity: No Food Insecurity (01/16/2020)   Hunger Vital Sign    Worried About Running Out of Food in the Last Year: Never true    Ran Out of Food in the Last Year: Never true  Transportation Needs: No Transportation Needs (01/16/2020)   PRAPARE - Hydrologist (Medical): No    Lack of Transportation (Non-Medical): No  Physical Activity: Not on file  Stress: Stress Concern Present (05/15/2019)   [place]    Feeling of Stress : Rather much  Social Connections: Unknown (01/16/2020)   Social Connection and Isolation Panel [NHANES]    Frequency of Communication with Friends and Family: More than three times a week    Frequency of Social Gatherings with Friends and Family: More than three times a week    Attends Religious Services: Not on file    Active Member of Clubs or Organizations: Not on file    Attends Archivist Meetings: Not on file    Marital Status: Not on file  Intimate Partner Violence: Not on file    Outpatient Encounter Medications as of 12/10/2021  Medication Sig   Accu-Chek Softclix Lancets lancets Use as instructed to monitor glucose twice daily   allopurinol (ZYLOPRIM) 100 MG tablet Take 1 tablet (100 mg total) by mouth daily.   atorvastatin (LIPITOR) 20 MG tablet Take 1 tablet (20 mg total) by mouth daily.   chlorthalidone (HYGROTON) 25 MG tablet Take 1 tablet (25 mg total) by mouth every other day.   Cholecalciferol (VITAMIN D3) 250 MCG (10000 UT) capsule Take 10,000 Units by mouth daily.   dapagliflozin propanediol (FARXIGA) 10 MG TABS tablet TAKE ONE TABLET BY MOUTH DAILY BEFORE BREAKFAST   DHEA 25 MG CAPS Take by mouth daily.   doxycycline (VIBRA-TABS) 100 MG tablet Take 1 tablet (100 mg total) by mouth 2 (two) times daily for 7 days. 1 po bid   gabapentin (NEURONTIN) 300 MG capsule Take 1 capsule by mouth daily as needed (pain).   glucose blood (ACCU-CHEK GUIDE) test strip Use as instructed to monitor glucose twice daily   hydrALAZINE (APRESOLINE) 50 MG tablet Take 1 tablet (50 mg total) by mouth 3 (three) times daily.   metFORMIN (GLUCOPHAGE-XR) 500 MG 24 hr tablet Take 1 tablet (500 mg total) by mouth daily with breakfast.   methocarbamol (ROBAXIN) 500 MG tablet Take 1 tablet (500 mg total) by mouth every 8 (eight) hours as needed for muscle spasms.   nystatin (MYCOSTATIN/NYSTOP) powder Apply 1 Application topically  2 (two) times daily.   oxyCODONE-acetaminophen (PERCOCET) 10-325 MG tablet Take 1 tablet by mouth 3 (three) times daily as needed for pain.   sitaGLIPtin (JANUVIA) 100 MG tablet Take 1 tablet (100 mg total) by mouth daily.   sulfamethoxazole-trimethoprim (BACTRIM DS) 800-160 MG tablet Take 1 tablet by mouth 2 (two) times daily for 7 days.   testosterone cypionate (DEPOTESTOSTERONE CYPIONATE) 200 MG/ML injection Inject 200 mg into the muscle every 14 (fourteen) days.   valsartan (DIOVAN) 320 MG tablet TAKE ONE TABLET BY MOUTH DAILY (Patient taking differently: Take 160 mg by mouth daily.)   XARELTO 20 MG TABS tablet TAKE ONE TABLET BY MOUTH DAILY WITH SUPPER   No facility-administered encounter medications on  file as of 12/10/2021.    Allergies  Allergen Reactions   Lisinopril Other (See Comments)    wheezing   Bystolic [Nebivolol Hcl] Swelling   Ibuprofen Other (See Comments)    Rectal bleed   Spironolactone Hives   Trulicity [Dulaglutide]     Sob, dry cough, GI   Amlodipine Itching and Other (See Comments)    Leg pain.   Diflucan [Fluconazole] Rash   Hctz [Hydrochlorothiazide] Other (See Comments)    Caused gout flares.   Losartan Rash    Review of Systems  Constitutional:  Negative for activity change, appetite change, chills, diaphoresis, fatigue, fever and unexpected weight change.  Respiratory:  Negative for shortness of breath.   Cardiovascular:  Negative for chest pain and leg swelling.  Gastrointestinal:  Negative for abdominal pain.  Genitourinary:  Negative for decreased urine volume and difficulty urinating.  Musculoskeletal:  Negative for arthralgias and myalgias.  Skin:  Positive for color change and wound.  Neurological:  Negative for dizziness, weakness and headaches.  Psychiatric/Behavioral:  Negative for confusion.   All other systems reviewed and are negative.       Objective:  BP 135/80   Pulse 68   Temp 98.5 F (36.9 C)   Ht 6' (1.829 m)   Wt (!)  383 lb (173.7 kg)   SpO2 96%   BMI 51.94 kg/m    Wt Readings from Last 3 Encounters:  12/10/21 (!) 383 lb (173.7 kg)  12/03/21 (!) 387 lb (175.5 kg)  10/28/21 (!) 384 lb (174.2 kg)    Physical Exam Vitals and nursing note reviewed.  Constitutional:      General: He is not in acute distress.    Appearance: Normal appearance. He is morbidly obese. He is not ill-appearing, toxic-appearing or diaphoretic.  HENT:     Mouth/Throat:     Mouth: Mucous membranes are moist.  Eyes:     Pupils: Pupils are equal, round, and reactive to light.  Cardiovascular:     Rate and Rhythm: Normal rate and regular rhythm.     Heart sounds: Normal heart sounds.  Pulmonary:     Effort: Pulmonary effort is normal.     Breath sounds: Normal breath sounds.  Skin:    General: Skin is warm and dry.     Capillary Refill: Capillary refill takes less than 2 seconds.     Findings: Abscess present.     Comments:  2 cm x 2 cm area of induration to left lower buttocks/upper thigh. Warmth, erythema present. Center area of ulceration about 0.25 cm. No fluctuance or drainage.   Neurological:     General: No focal deficit present.     Mental Status: He is alert and oriented to person, place, and time.  Psychiatric:        Mood and Affect: Mood normal.        Behavior: Behavior normal.        Thought Content: Thought content normal.        Judgment: Judgment normal.     Results for orders placed or performed in visit on 10/28/21  HgB A1c  Result Value Ref Range   Hemoglobin A1C     HbA1c POC (<> result, manual entry) 9.9 4.0 - 5.6 %   HbA1c, POC (prediabetic range)     HbA1c, POC (controlled diabetic range)    POCT UA - Microalbumin  Result Value Ref Range   Microalbumin Ur, POC 30 mg/L   Creatinine, POC 300 mg/dL  Albumin/Creatinine Ratio, Urine, POC <30        Pertinent labs & imaging results that were available during my care of the patient were reviewed by me and considered in my medical decision  making.  Assessment & Plan:  Kaylum was seen today for abscess recheck.  Diagnoses and all orders for this visit:  Abscess of buttock, left Has improved greatly. No induration or fluctuance for drainage. Will place on Bactrim for 7 days. Pt aware of preventative measures and symptomatic care. Report new, worsening, or persistent symptoms.  -     sulfamethoxazole-trimethoprim (BACTRIM DS) 800-160 MG tablet; Take 1 tablet by mouth 2 (two) times daily for 7 days.    Continue all other maintenance medications.  Follow up plan: Return if symptoms worsen or fail to improve.   Continue healthy lifestyle choices, including diet (rich in fruits, vegetables, and lean proteins, and low in salt and simple carbohydrates) and exercise (at least 30 minutes of moderate physical activity daily).  Educational handout given for abscess  The above assessment and management plan was discussed with the patient. The patient verbalized understanding of and has agreed to the management plan. Patient is aware to call the clinic if they develop any new symptoms or if symptoms persist or worsen. Patient is aware when to return to the clinic for a follow-up visit. Patient educated on when it is appropriate to go to the emergency department.   Monia Pouch, [name]-C [facility] [phone]

## 2021-12-22 ENCOUNTER — Encounter: Payer: Self-pay | Admitting: Nutrition

## 2021-12-22 ENCOUNTER — Encounter: Payer: Medicaid Other | Attending: Family Medicine | Admitting: Nutrition

## 2021-12-22 VITALS — Ht 72.0 in | Wt 370.0 lb

## 2021-12-22 DIAGNOSIS — E782 Mixed hyperlipidemia: Secondary | ICD-10-CM | POA: Diagnosis not present

## 2021-12-22 DIAGNOSIS — N183 Chronic kidney disease, stage 3 unspecified: Secondary | ICD-10-CM

## 2021-12-22 DIAGNOSIS — I152 Hypertension secondary to endocrine disorders: Secondary | ICD-10-CM | POA: Diagnosis not present

## 2021-12-22 DIAGNOSIS — E1159 Type 2 diabetes mellitus with other circulatory complications: Secondary | ICD-10-CM | POA: Diagnosis not present

## 2021-12-22 DIAGNOSIS — E1165 Type 2 diabetes mellitus with hyperglycemia: Secondary | ICD-10-CM

## 2021-12-22 NOTE — Progress Notes (Signed)
Medical Nutrition Therapy  Appointment Start time:  0350 Appointment End time:  37  Primary concerns today: DM Type 2, Obesity  Referral diagnosis: E11.8, E66.01 Preferred learning style: See and hear  Learning readiness: change   NUTRITION ASSESSMENT  Dm and obesity follow up 59 y old bmale who doesn't read or write. Sees Rayetta Pigg, [name] for his DM.   Changes made since last visit. Trying to cut out more sodas. Drinking water. Eats at his brothers restaurant. Only eats 2 meals per day. He does like vegetables and some fruit from his brother's restaurant.  Stays up at night and sleep during day. Likes to stay up and watch movies and car racing programs. Can't stand long on his feet before they hurt and are weak. Not willing to change his sleeping schedule. He notes he can't sleep at night.  A1C 9.9% Metformin 500  ER mg once a day. Januvia 100 mg a day Farxiga 10 mg a day. Brought meter today. FBS: in am 160-190s 7 day avg 174 mg/dl 14 days 192 mg/dl, 30 day avg was 193 mg/dl.   Lab Results  Component Value Date   HGBA1C 9.9 10/28/2021   Anthropometrics  Wt Readings from Last 3 Encounters:  12/22/21 (!) 370 lb (167.8 kg)  12/10/21 (!) 383 lb (173.7 kg)  12/03/21 (!) 387 lb (175.5 kg)   Ht Readings from Last 3 Encounters:  12/22/21 6' (1.829 m)  12/10/21 6' (1.829 m)  12/03/21 6' (1.829 m)   Body mass index is 50.18 kg/m. '@BMIFA'$ @ Facility age limit for growth %iles is 20 years. Facility age limit for growth %iles is 20 years.    Clinical Medical Hx: see chart Medications: Farxiga 10 mg, 500 mg per day., Januvia Labs:  Lab Results  Component Value Date   HGBA1C 9.9 10/28/2021      Latest Ref Rng & Units 08/18/2021    2:12 PM 04/29/2021   12:00 AM 02/11/2021   10:25 AM  CMP  Glucose 70 - 99 mg/dL 375   320   BUN 6 - 24 mg/dL 16   18   Creatinine 0.76 - 1.27 mg/dL 1.47   1.41   Sodium 134 - 144 mmol/L 137   138   Potassium 3.5 - 5.2 mmol/L 4.5  4.1      4.0   Chloride 96 - 106 mmol/L 94  98     95   CO2 20 - 29 mmol/L '26  29     27   '$ Calcium 8.7 - 10.2 mg/dL 9.5   9.5   Total Protein 6.0 - 8.5 g/dL 7.1   6.8   Total Bilirubin 0.0 - 1.2 mg/dL 0.5   0.4   Alkaline Phos 44 - 121 IU/L 86  73     90   AST 0 - 40 IU/L '19  19     15   '$ ALT 0 - 44 IU/L '26  18     22      '$ This result is from an external source.   Lipid Panel     Component Value Date/Time   CHOL 140 08/18/2021 1412   TRIG 179 (H) 08/18/2021 1412   HDL 31 (L) 08/18/2021 1412   CHOLHDL 4.5 08/18/2021 1412   CHOLHDL 5.1 01/20/2018 0209   VLDL 26 01/20/2018 0209   LDLCALC 78 08/18/2021 1412   LABVLDL 31 08/18/2021 1412    Notable Signs/Symptoms: Increased thirst, fatigue  Lifestyle & Dietary Hx Lives by himself  Eats most meals at home. Does't cook. 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   Estimated daily fluid intake: 60 oz Supplements:  sleep: 6-7 hrs Stress / self-care:  Current average weekly physical activity: ADL  24-Hr Dietary Recall  3 pm few crackers 5-6, [employer] lemonade sf, blueberries,  7pm: Eggs, ham, hash browns, slice tomatoes, sweet tea [place] time.  Estimated Energy Needs Calories: 1500 Carbohydrate: 170  Protein: 112g Fat: 42g   NUTRITION DIAGNOSIS  NB-1.1 Food and nutrition-related knowledge deficit As related to Diabetes Type 2.  As evidenced by A1C 9.9%.   NUTRITION INTERVENTION  Nutrition education (E-1) on the following topics:  Lifestyle Medicine  - Whole Food, Plant Predominant Nutrition is highly recommended: Eat Plenty of vegetables, Mushrooms, fruits, Legumes, Whole Grains, Nuts, seeds in lieu of processed meats, processed snacks/pastries red meat, poultry, eggs.    -It is better to avoid simple carbohydrates including: Cakes, Sweet Desserts, Ice Cream, Soda (diet and regular), Sweet Tea, Candies, Chips, Cookies, Store Bought Juices, Alcohol in Excess of  1-2 drinks a day, Lemonade,  Artificial Sweeteners, Doughnuts,  Coffee Creamers, "Sugar-free" Products, etc, etc.  This is not a complete list.....  Exercise: If you are able: 30 -60 minutes a day ,4 days a week, or 150 minutes a week.  The longer the better.  Combine stretch, strength, and aerobic activities.  If you were told in the past that you have high risk for cardiovascular diseases, you may seek evaluation by your heart doctor prior to initiating moderate to intense exercise programs.   Handouts Provided Include  Lifestyle medicine  visual plate handout  Learning Style & Readiness for Change Teaching method utilized: Visual & Auditory  Demonstrated degree of understanding via: Teach Back  Barriers to learning/adherence to lifestyle change: Reading and writing. Not being able to cook  Goals Established by Pt Goals  Increase fresh fruits and vegetables. Try to cut down on soda Drink more water Get A1C down to 7%    MONITORING & EVALUATION Dietary intake, weekly physical activity, and weight and blood sugars in 2 month.  Next Steps  Patient is to work on choosing better food choices at meal time.Marland Kitchen

## 2021-12-22 NOTE — Patient Instructions (Signed)
Goals  Increase fresh fruits and vegetables. Try to cut down on soda Drink more water Get A1C down to 7%

## 2021-12-27 DIAGNOSIS — E7800 Pure hypercholesterolemia, unspecified: Secondary | ICD-10-CM | POA: Diagnosis not present

## 2021-12-27 DIAGNOSIS — R7309 Other abnormal glucose: Secondary | ICD-10-CM | POA: Diagnosis not present

## 2021-12-27 DIAGNOSIS — E291 Testicular hypofunction: Secondary | ICD-10-CM | POA: Diagnosis not present

## 2021-12-27 DIAGNOSIS — Z79899 Other long term (current) drug therapy: Secondary | ICD-10-CM | POA: Diagnosis not present

## 2021-12-27 DIAGNOSIS — M539 Dorsopathy, unspecified: Secondary | ICD-10-CM | POA: Diagnosis not present

## 2021-12-27 DIAGNOSIS — Z6841 Body Mass Index (BMI) 40.0 and over, adult: Secondary | ICD-10-CM | POA: Diagnosis not present

## 2021-12-27 DIAGNOSIS — R5383 Other fatigue: Secondary | ICD-10-CM | POA: Diagnosis not present

## 2022-01-27 ENCOUNTER — Other Ambulatory Visit: Payer: Self-pay | Admitting: Cardiovascular Disease

## 2022-01-27 DIAGNOSIS — L0231 Cutaneous abscess of buttock: Secondary | ICD-10-CM | POA: Diagnosis not present

## 2022-01-31 DIAGNOSIS — M539 Dorsopathy, unspecified: Secondary | ICD-10-CM | POA: Diagnosis not present

## 2022-01-31 DIAGNOSIS — Z79899 Other long term (current) drug therapy: Secondary | ICD-10-CM | POA: Diagnosis not present

## 2022-01-31 DIAGNOSIS — E291 Testicular hypofunction: Secondary | ICD-10-CM | POA: Diagnosis not present

## 2022-01-31 DIAGNOSIS — K5909 Other constipation: Secondary | ICD-10-CM | POA: Diagnosis not present

## 2022-01-31 DIAGNOSIS — Z6841 Body Mass Index (BMI) 40.0 and over, adult: Secondary | ICD-10-CM | POA: Diagnosis not present

## 2022-02-01 ENCOUNTER — Telehealth: Payer: Self-pay | Admitting: Nurse Practitioner

## 2022-02-01 NOTE — Telephone Encounter (Signed)
Called pt to see if he did his labs for his appt tomorrow. Patient states he tried calling an hour ago and we did not answer for him while he was at his PCP. I advised pt that we were with other patients and to please leave a message, as the voicemail states we will return your call. He said you " were not checking in patients for one hour ". I advised pt I was canceling the appt until labs are complete and patient hung up

## 2022-02-02 ENCOUNTER — Ambulatory Visit: Payer: Medicaid Other | Admitting: Nurse Practitioner

## 2022-02-02 ENCOUNTER — Ambulatory Visit: Payer: Medicaid Other | Admitting: Nutrition

## 2022-02-08 ENCOUNTER — Ambulatory Visit: Payer: Medicaid Other | Admitting: Family Medicine

## 2022-02-14 ENCOUNTER — Ambulatory Visit: Payer: Medicaid Other | Admitting: Family Medicine

## 2022-02-14 ENCOUNTER — Encounter: Payer: Self-pay | Admitting: Family Medicine

## 2022-02-14 VITALS — BP 130/81 | HR 79 | Temp 97.9°F | Ht 72.0 in | Wt 374.8 lb

## 2022-02-14 DIAGNOSIS — Z125 Encounter for screening for malignant neoplasm of prostate: Secondary | ICD-10-CM

## 2022-02-14 DIAGNOSIS — E1165 Type 2 diabetes mellitus with hyperglycemia: Secondary | ICD-10-CM | POA: Diagnosis not present

## 2022-02-14 DIAGNOSIS — I152 Hypertension secondary to endocrine disorders: Secondary | ICD-10-CM

## 2022-02-14 DIAGNOSIS — I10 Essential (primary) hypertension: Secondary | ICD-10-CM | POA: Diagnosis not present

## 2022-02-14 DIAGNOSIS — E1159 Type 2 diabetes mellitus with other circulatory complications: Secondary | ICD-10-CM

## 2022-02-14 DIAGNOSIS — M109 Gout, unspecified: Secondary | ICD-10-CM

## 2022-02-14 DIAGNOSIS — Z86711 Personal history of pulmonary embolism: Secondary | ICD-10-CM

## 2022-02-14 DIAGNOSIS — Z23 Encounter for immunization: Secondary | ICD-10-CM

## 2022-02-14 LAB — BAYER DCA HB A1C WAIVED: HB A1C (BAYER DCA - WAIVED): 8.5 % — ABNORMAL HIGH (ref 4.8–5.6)

## 2022-02-14 MED ORDER — RIVAROXABAN 20 MG PO TABS: ORAL_TABLET | ORAL | 3 refills | Status: DC

## 2022-02-14 MED ORDER — METFORMIN HCL ER 750 MG PO TB24: 750.0000 mg | ORAL_TABLET | Freq: Every day | ORAL | 1 refills | Status: DC

## 2022-02-14 MED ORDER — PIOGLITAZONE HCL 30 MG PO TABS: 30.0000 mg | ORAL_TABLET | Freq: Every day | ORAL | 2 refills | Status: DC

## 2022-02-14 MED ORDER — ALLOPURINOL 100 MG PO TABS
100.0000 mg | ORAL_TABLET | Freq: Every day | ORAL | 3 refills | Status: DC
Start: 2022-02-14 — End: 2023-04-04

## 2022-02-14 NOTE — Progress Notes (Signed)
Subjective:  Patient ID: Bradley Torres,  male    DOB: [birth year]  Age: 61 y.o.    CC: Medical Management of Chronic Issues   HPI Bradley Torres presents for  follow-up of hypertension. Patient has no history of headache chest pain or shortness of breath or recent cough. Patient also denies symptoms of TIA such as numbness weakness lateralizing. Patient denies side effects from medication. States taking it regularly.  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 Pt. is treated with anticoagulation. Pt.  deniesdyspnea and edema. There has been no bleeding from nose or gums. Pt. has not noticed blood with urine or stool.  Although there is routine bruising easily, it is not excessive.   Follow-up of diabetes. Patient does check blood sugar at home. Readings run between 180 and 250 Patient denies symptoms such as excessive hunger or urinary frequency, excessive hunger, nausea No significant hypoglycemic spells noted. Medications reviewed. Pt reports taking them regularly. Pt. denies complication/adverse reaction today. Has had trouble tolerating GLP 1a oral or injected. Saw someone at Endo office wh said he was better. Pt cannot exercise due to chronic pain. Can barely walk. He has cut back on sweets, sodas, bread. Eats rice occasionally only. Rarely eats corn. HE does eat potatoes.    History Bradley Torres has a past medical history of CAD (coronary artery disease), Chronic back pain, Diabetes mellitus, type 2 ([place]), DVT (deep venous thrombosis) ([place]), Essential hypertension, MI (myocardial infarction) ([place]) (2019), Morbid obesity ([place]), Panic disorder, Shoulder pain, left, Sleep apnea, Stroke ([place]) (01/2018), and Vitamin D deficiency.   He has a past surgical history that includes Lumbar spine surgery; Ankle fracture sugery (Left); Tonsillectomy; Colonoscopy with propofol (N/A, 05/29/2017); polypectomy (05/29/2017); LEFT HEART CATH AND CORONARY ANGIOGRAPHY (N/A, 01/23/2018); Colonoscopy with propofol (N/A,  07/20/2020); and biopsy (07/20/2020).   His family history includes Colon cancer in his mother; Heart attack in his father and paternal grandfather; Hepatitis (age of onset: 31) in his sister.He reports that he has never smoked. He has never used smokeless tobacco. He reports current alcohol use. He reports that he does not currently use drugs after having used the following drugs: Marijuana.  Current Outpatient Medications on File Prior to Visit  Medication Sig Dispense Refill   Accu-Chek Softclix Lancets lancets Use as instructed to monitor glucose twice daily 100 each 12   atorvastatin (LIPITOR) 20 MG tablet Take 1 tablet (20 mg total) by mouth daily. 90 tablet 3   chlorthalidone (HYGROTON) 25 MG tablet Take 1 tablet (25 mg total) by mouth every other day. 45 tablet 3   Cholecalciferol (VITAMIN D3) 250 MCG (10000 UT) capsule Take 10,000 Units by mouth daily.     dapagliflozin propanediol (FARXIGA) 10 MG TABS tablet TAKE ONE TABLET BY MOUTH DAILY BEFORE BREAKFAST 90 tablet 3   DHEA 25 MG CAPS Take by mouth daily.     gabapentin (NEURONTIN) 300 MG capsule Take 1 capsule by mouth daily as needed (pain).     glucose blood (ACCU-CHEK GUIDE) test strip Use as instructed to monitor glucose twice daily 100 each 12   hydrALAZINE (APRESOLINE) 50 MG tablet TAKE ONE TABLET BY MOUTH THREE TIMES DAILY 90 tablet 1   methocarbamol (ROBAXIN) 500 MG tablet Take 1 tablet (500 mg total) by mouth every 8 (eight) hours as needed for muscle spasms. 60 tablet 1   nystatin (MYCOSTATIN/NYSTOP) powder Apply 1 Application topically 2 (two) times daily. 60 g 0   oxyCODONE-acetaminophen (PERCOCET) 10-325 MG tablet Take  1 tablet by mouth 3 (three) times daily as needed for pain.     sitaGLIPtin (JANUVIA) 100 MG tablet Take 1 tablet (100 mg total) by mouth daily. 90 tablet 3   testosterone cypionate (DEPOTESTOSTERONE CYPIONATE) 200 MG/ML injection Inject 200 mg into the muscle every 14 (fourteen) days.     valsartan (DIOVAN)  320 MG tablet TAKE ONE TABLET BY MOUTH DAILY (Patient taking differently: Take 160 mg by mouth daily.) 30 tablet 6   No current facility-administered medications on file prior to visit.    ROS Review of Systems  Constitutional: Negative.   HENT: Negative.    Eyes:  Negative for visual disturbance.  Respiratory:  Negative for cough and shortness of breath.   Cardiovascular:  Negative for chest pain and leg swelling.  Gastrointestinal:  Negative for abdominal pain, diarrhea, nausea and vomiting.  Genitourinary:  Negative for difficulty urinating.  Musculoskeletal:  Positive for arthralgias, gait problem and myalgias.  Skin:  Negative for rash.  Neurological:  Negative for headaches.  Psychiatric/Behavioral:  Negative for sleep disturbance.     Objective:  BP 130/81   Pulse 79   Temp 97.9 F (36.6 C)   Ht 6' (1.829 m)   Wt (!) 374 lb 12.8 oz (170 kg)   SpO2 93%   BMI 50.83 kg/m   BP Readings from Last 3 Encounters:  02/14/22 130/81  12/10/21 135/80  12/03/21 (!) 142/77    Wt Readings from Last 3 Encounters:  02/14/22 (!) 374 lb 12.8 oz (170 kg)  12/22/21 (!) 370 lb (167.8 kg)  12/10/21 (!) 383 lb (173.7 kg)     Physical Exam Constitutional:      General: He is not in acute distress.    Appearance: He is well-developed. He is obese.  HENT:     Head: Normocephalic and atraumatic.     Right Ear: External ear normal.     Left Ear: External ear normal.     Nose: Nose normal.  Eyes:     Conjunctiva/sclera: Conjunctivae normal.     Pupils: Pupils are equal, round, and reactive to light.  Cardiovascular:     Rate and Rhythm: Normal rate and regular rhythm.     Heart sounds: Normal heart sounds. No murmur heard. Pulmonary:     Effort: Pulmonary effort is normal. No respiratory distress.     Breath sounds: Normal breath sounds. No wheezing or rales.  Abdominal:     Palpations: Abdomen is soft.     Tenderness: There is no abdominal tenderness.  Musculoskeletal:         General: Normal range of motion.     Cervical back: Normal range of motion and neck supple.  Skin:    General: Skin is warm and dry.  Neurological:     Mental Status: He is alert and oriented to person, place, and time.     Deep Tendon Reflexes: Reflexes are normal and symmetric.  Psychiatric:        Behavior: Behavior normal.        Thought Content: Thought content normal.        Judgment: Judgment normal.     Diabetic Foot Exam - Simple   No data filed     Lab Results  Component Value Date   HGBA1C 8.5 (H) 02/14/2022   HGBA1C 9.9 10/28/2021   HGBA1C 10.1 (H) 08/18/2021    Assessment & Plan:   Fahim was seen today for medical management of chronic issues.  Diagnoses and all  orders for this visit:  Type 2 diabetes mellitus with hyperglycemia, without long-term current use of insulin (HCC) -     Bayer DCA Hb A1c Waived -     CBC with Differential/Platelet -     CMP14+EGFR -     Lipid panel -     allopurinol (ZYLOPRIM) 100 MG tablet; Take 1 tablet (100 mg total) by mouth daily. -     rivaroxaban (XARELTO) 20 MG TABS tablet; TAKE ONE TABLET BY MOUTH DAILY WITH SUPPER  Hypertension associated with diabetes (HCC) -     Bayer DCA Hb A1c Waived -     CBC with Differential/Platelet -     CMP14+EGFR -     Lipid panel -     allopurinol (ZYLOPRIM) 100 MG tablet; Take 1 tablet (100 mg total) by mouth daily. -     rivaroxaban (XARELTO) 20 MG TABS tablet; TAKE ONE TABLET BY MOUTH DAILY WITH SUPPER  Prostate cancer screening -     PSA, total and free  Gout without tophus -     allopurinol (ZYLOPRIM) 100 MG tablet; Take 1 tablet (100 mg total) by mouth daily.  Hx pulmonary embolism -     rivaroxaban (XARELTO) 20 MG TABS tablet; TAKE ONE TABLET BY MOUTH DAILY WITH SUPPER  Other orders -     metFORMIN (GLUCOPHAGE-XR) 750 MG 24 hr tablet; Take 1 tablet (750 mg total) by mouth daily with breakfast. -     pioglitazone (ACTOS) 30 MG tablet; Take 1 tablet (30 mg total) by  mouth daily.   I have discontinued [name] L. Hefty's metFORMIN. I have also changed his Xarelto to rivaroxaban. Additionally, I am having him start on metFORMIN and pioglitazone. Lastly, I am having him maintain his oxyCODONE-acetaminophen, gabapentin, valsartan, testosterone cypionate, atorvastatin, Vitamin D3, methocarbamol, DHEA, Accu-Chek Softclix Lancets, chlorthalidone, dapagliflozin propanediol, Accu-Chek Guide, sitaGLIPtin, nystatin, hydrALAZINE, and allopurinol.  Meds ordered this encounter  Medications   allopurinol (ZYLOPRIM) 100 MG tablet    Sig: Take 1 tablet (100 mg total) by mouth daily.    Dispense:  90 tablet    Refill:  3   rivaroxaban (XARELTO) 20 MG TABS tablet    Sig: TAKE ONE TABLET BY MOUTH DAILY WITH SUPPER    Dispense:  90 tablet    Refill:  3   metFORMIN (GLUCOPHAGE-XR) 750 MG 24 hr tablet    Sig: Take 1 tablet (750 mg total) by mouth daily with breakfast.    Dispense:  90 tablet    Refill:  1   pioglitazone (ACTOS) 30 MG tablet    Sig: Take 1 tablet (30 mg total) by mouth daily.    Dispense:  30 tablet    Refill:  2     Follow-up: Return in about 6 weeks (around 03/28/2022).  Claretta Fraise, M.D.

## 2022-02-15 LAB — CMP14+EGFR
ALT: 22 IU/L (ref 0–44)
AST: 15 IU/L (ref 0–40)
Albumin/Globulin Ratio: 1.4 (ref 1.2–2.2)
Albumin: 4.2 g/dL (ref 3.8–4.9)
Alkaline Phosphatase: 89 IU/L (ref 44–121)
BUN/Creatinine Ratio: 12 (ref 10–24)
BUN: 18 mg/dL (ref 8–27)
Bilirubin Total: 0.6 mg/dL (ref 0.0–1.2)
CO2: 26 mmol/L (ref 20–29)
Calcium: 10.2 mg/dL (ref 8.6–10.2)
Chloride: 95 mmol/L — ABNORMAL LOW (ref 96–106)
Creatinine, Ser: 1.5 mg/dL — ABNORMAL HIGH (ref 0.76–1.27)
Globulin, Total: 2.9 g/dL (ref 1.5–4.5)
Glucose: 204 mg/dL — ABNORMAL HIGH (ref 70–99)
Potassium: 4 mmol/L (ref 3.5–5.2)
Sodium: 137 mmol/L (ref 134–144)
Total Protein: 7.1 g/dL (ref 6.0–8.5)
eGFR: 53 mL/min/{1.73_m2} — ABNORMAL LOW (ref >59)

## 2022-02-15 LAB — CBC WITH DIFFERENTIAL/PLATELET
Basophils Absolute: 0.1 10*3/uL (ref 0.0–0.2)
Basos: 1 %
EOS (ABSOLUTE): 0.2 10*3/uL (ref 0.0–0.4)
Eos: 2 %
Hematocrit: 49 % (ref 37.5–51.0)
Hemoglobin: 16.3 g/dL (ref 13.0–17.7)
Immature Grans (Abs): 0.1 10*3/uL (ref 0.0–0.1)
Immature Granulocytes: 1 %
Lymphocytes Absolute: 1.2 10*3/uL (ref 0.7–3.1)
Lymphs: 11 %
MCH: 30.8 pg (ref 26.6–33.0)
MCHC: 33.3 g/dL (ref 31.5–35.7)
MCV: 93 fL (ref 79–97)
Monocytes Absolute: 0.7 10*3/uL (ref 0.1–0.9)
Monocytes: 7 %
Neutrophils Absolute: 8.3 10*3/uL — ABNORMAL HIGH (ref 1.4–7.0)
Neutrophils: 78 %
Platelets: 281 10*3/uL (ref 150–450)
RBC: 5.29 x10E6/uL (ref 4.14–5.80)
RDW: 14.1 % (ref 11.6–15.4)
WBC: 10.6 10*3/uL (ref 3.4–10.8)

## 2022-02-15 LAB — LIPID PANEL
Chol/HDL Ratio: 3.8 ratio (ref 0.0–5.0)
Cholesterol, Total: 125 mg/dL (ref 100–199)
HDL: 33 mg/dL — ABNORMAL LOW (ref >39)
LDL Chol Calc (NIH): 69 mg/dL (ref 0–99)
Triglycerides: 126 mg/dL (ref 0–149)
VLDL Cholesterol Cal: 23 mg/dL (ref 5–40)

## 2022-02-15 LAB — PSA, TOTAL AND FREE
PSA, Free Pct: 25 %
PSA, Free: 0.1 ng/mL
Prostate Specific Ag, Serum: 0.4 ng/mL (ref 0.0–4.0)

## 2022-02-15 NOTE — Addendum Note (Signed)
Addended by: Baldomero Lamy B on: 02/15/2022 04:50 PM   Modules accepted: Orders

## 2022-02-21 NOTE — Progress Notes (Signed)
Hello Montgomery,  Your lab result is normal and/or stable. (Except as noted in the office - high A1c). Some minor variations that are not significant are commonly marked abnormal, but do not represent any medical problem for you.  Best regards, Claretta Fraise, M.D.

## 2022-02-28 DIAGNOSIS — R030 Elevated blood-pressure reading, without diagnosis of hypertension: Secondary | ICD-10-CM | POA: Diagnosis not present

## 2022-02-28 DIAGNOSIS — Z79899 Other long term (current) drug therapy: Secondary | ICD-10-CM | POA: Diagnosis not present

## 2022-02-28 DIAGNOSIS — E291 Testicular hypofunction: Secondary | ICD-10-CM | POA: Diagnosis not present

## 2022-02-28 DIAGNOSIS — M539 Dorsopathy, unspecified: Secondary | ICD-10-CM | POA: Diagnosis not present

## 2022-02-28 DIAGNOSIS — Z6841 Body Mass Index (BMI) 40.0 and over, adult: Secondary | ICD-10-CM | POA: Diagnosis not present

## 2022-03-01 ENCOUNTER — Telehealth: Payer: Self-pay | Admitting: Cardiology

## 2022-03-01 MED ORDER — VALSARTAN 160 MG PO TABS: 160.0000 mg | ORAL_TABLET | Freq: Every day | ORAL | 1 refills | Status: DC

## 2022-03-01 NOTE — Addendum Note (Signed)
Addended by: Laurine Blazer on: 03/01/2022 01:33 PM   Modules accepted: Orders

## 2022-03-01 NOTE — Telephone Encounter (Signed)
Pt c/o medication issue:  1. Name of Medication:  Valsartan 320 MG tablet  2. How are you currently taking this medication (dosage and times per day)?   3. Are you having a reaction (difficulty breathing--STAT)?   4. What is your medication issue?   Matt with [facility] called in regarding Rx for Valsartan. He states they received Rx and he noticed that Valsartan has been decreased from 320 MG to 160 MG and patient is being instructed to split the 320 MG tablet in half. Catalina Antigua would like to know if it would be fine to just prescribe the 160 MG tablet instead so that the patient does not have to split it. Please advise.

## 2022-03-01 NOTE — Telephone Encounter (Signed)
Notified Catalina Antigua) pharm new prescription sent via epic.

## 2022-04-05 ENCOUNTER — Ambulatory Visit: Payer: Medicaid Other | Admitting: Family Medicine

## 2022-04-07 DIAGNOSIS — Z6841 Body Mass Index (BMI) 40.0 and over, adult: Secondary | ICD-10-CM | POA: Diagnosis not present

## 2022-04-07 DIAGNOSIS — E291 Testicular hypofunction: Secondary | ICD-10-CM | POA: Diagnosis not present

## 2022-04-07 DIAGNOSIS — E1165 Type 2 diabetes mellitus with hyperglycemia: Secondary | ICD-10-CM | POA: Diagnosis not present

## 2022-04-07 DIAGNOSIS — M539 Dorsopathy, unspecified: Secondary | ICD-10-CM | POA: Diagnosis not present

## 2022-04-07 DIAGNOSIS — E7800 Pure hypercholesterolemia, unspecified: Secondary | ICD-10-CM | POA: Diagnosis not present

## 2022-04-07 DIAGNOSIS — Z79899 Other long term (current) drug therapy: Secondary | ICD-10-CM | POA: Diagnosis not present

## 2022-04-28 ENCOUNTER — Encounter: Payer: Self-pay | Admitting: Cardiology

## 2022-04-28 ENCOUNTER — Ambulatory Visit: Payer: Medicaid Other | Attending: Cardiology | Admitting: Cardiology

## 2022-04-28 VITALS — BP 129/88 | HR 73 | Ht 72.0 in | Wt 375.6 lb

## 2022-04-28 DIAGNOSIS — D6869 Other thrombophilia: Secondary | ICD-10-CM

## 2022-04-28 DIAGNOSIS — I2510 Atherosclerotic heart disease of native coronary artery without angina pectoris: Secondary | ICD-10-CM

## 2022-04-28 DIAGNOSIS — I10 Essential (primary) hypertension: Secondary | ICD-10-CM | POA: Diagnosis not present

## 2022-04-28 DIAGNOSIS — I4891 Unspecified atrial fibrillation: Secondary | ICD-10-CM

## 2022-04-28 NOTE — Progress Notes (Signed)
Clinical Summary Mr. Marczak is a 62 y.o.male seen today for follow up of the following medical problems.   1.CAD - Cardiac catheterization in 2019 showed 10% proximal LAD stenosis and no obstructive disease.    - nonspecific chest pains, often occurs with laying side   2. Palpitations - denies any recent symptoms.    3. HTN Patient is intolerant to multiple antihypertensive medications including ACE inhibitors, ARB's, calcium channel blockers, aldosterone inhibitors.  Recently refused hydralazine by PCP due to the fact that he did not want to take the medication multiple times per day     -compliant with meds. Taking valsartan, hydral once day, chlorthalidone every other day.    4. History of PE - no bleeding on xarelto     5. Pulmonary HTN/History of DVT - from notes thought secondary to morbid obesity, OSA - history of right leg pain     6. OSA - was not comfortable on cpap machine, not using.  - followed by Dr Brett Fairy.    7. Afib - no palpitations - compliant with xarelto        Past Medical History:  Diagnosis Date   CAD (coronary artery disease)    a. 12/2017: cath showing 10% Proximal-LAD stenosis with no significant obstructive disease and LVEDP mildly elevated at 18 mm Hg.    Chronic back pain    Diabetes mellitus, type 2 (HCC)    DVT (deep venous thrombosis) (HCC)    Essential hypertension    MI (myocardial infarction) ([place]) 2019   Morbid obesity ([place])    Panic disorder    Shoulder pain, left    Following fall   Sleep apnea    Stroke ([place]) 01/2018   Vitamin D deficiency      Allergies  Allergen Reactions   Lisinopril Other (See Comments)    wheezing   Bystolic [Nebivolol Hcl] Swelling   Ibuprofen Other (See Comments)    Rectal bleed   Spironolactone Hives   Trulicity [Dulaglutide]     Sob, dry cough, GI   Amlodipine Itching and Other (See Comments)    Leg pain.   Diflucan [Fluconazole] Rash   Hctz [Hydrochlorothiazide] Other (See  Comments)    Caused gout flares.   Losartan Rash     Current Outpatient Medications  Medication Sig Dispense Refill   Accu-Chek Softclix Lancets lancets Use as instructed to monitor glucose twice daily 100 each 12   allopurinol (ZYLOPRIM) 100 MG tablet Take 1 tablet (100 mg total) by mouth daily. 90 tablet 3   atorvastatin (LIPITOR) 20 MG tablet Take 1 tablet (20 mg total) by mouth daily. 90 tablet 3   chlorthalidone (HYGROTON) 25 MG tablet Take 1 tablet (25 mg total) by mouth every other day. 45 tablet 3   Cholecalciferol (VITAMIN D3) 250 MCG (10000 UT) capsule Take 10,000 Units by mouth daily.     dapagliflozin propanediol (FARXIGA) 10 MG TABS tablet TAKE ONE TABLET BY MOUTH DAILY BEFORE BREAKFAST 90 tablet 3   DHEA 25 MG CAPS Take by mouth daily.     gabapentin (NEURONTIN) 300 MG capsule Take 1 capsule by mouth daily as needed (pain).     glucose blood (ACCU-CHEK GUIDE) test strip Use as instructed to monitor glucose twice daily 100 each 12   hydrALAZINE (APRESOLINE) 50 MG tablet TAKE ONE TABLET BY MOUTH THREE TIMES DAILY 90 tablet 1   metFORMIN (GLUCOPHAGE-XR) 750 MG 24 hr tablet Take 1 tablet (750 mg total) by mouth  daily with breakfast. 90 tablet 1   methocarbamol (ROBAXIN) 500 MG tablet Take 1 tablet (500 mg total) by mouth every 8 (eight) hours as needed for muscle spasms. 60 tablet 1   nystatin (MYCOSTATIN/NYSTOP) powder Apply 1 Application topically 2 (two) times daily. 60 g 0   oxyCODONE-acetaminophen (PERCOCET) 10-325 MG tablet Take 1 tablet by mouth 3 (three) times daily as needed for pain.     pioglitazone (ACTOS) 30 MG tablet Take 1 tablet (30 mg total) by mouth daily. 30 tablet 2   rivaroxaban (XARELTO) 20 MG TABS tablet TAKE ONE TABLET BY MOUTH DAILY WITH SUPPER 90 tablet 3   sitaGLIPtin (JANUVIA) 100 MG tablet Take 1 tablet (100 mg total) by mouth daily. 90 tablet 3   testosterone cypionate (DEPOTESTOSTERONE CYPIONATE) 200 MG/ML injection Inject 200 mg into the muscle every  14 (fourteen) days.     valsartan (DIOVAN) 160 MG tablet Take 1 tablet (160 mg total) by mouth daily. 90 tablet 1   No current facility-administered medications for this visit.     Past Surgical History:  Procedure Laterality Date   Ankle fracture sugery Left    BIOPSY  07/20/2020   Procedure: BIOPSY;  Surgeon: Daneil Dolin, MD;  Location: [facility];  Service: Endoscopy;;  cecal polyp   COLONOSCOPY WITH PROPOFOL N/A 05/29/2017   Dr. [name]: Multiple tubular adenomas removed, prep inadequate.  Short interval surveillance colonoscopy recommended in 6 months   COLONOSCOPY WITH PROPOFOL N/A 07/20/2020   Procedure: COLONOSCOPY WITH PROPOFOL;  Surgeon: Daneil Dolin, MD;  Location: [facility];  Service: Endoscopy;  Laterality: N/A;  am appt, early as possible since diabetic   LEFT HEART CATH AND CORONARY ANGIOGRAPHY N/A 01/23/2018   Procedure: LEFT HEART CATH AND CORONARY ANGIOGRAPHY;  Surgeon: Troy Sine, MD;  Location: [facility];  Service: Cardiovascular;  Laterality: N/A;   LUMBAR SPINE SURGERY     fusion   POLYPECTOMY  05/29/2017   Procedure: POLYPECTOMY;  Surgeon: Daneil Dolin, MD;  Location: [facility];  Service: Endoscopy;;  ascending colon polyp x5-cs transverse colon polyp x4- cs descending colon polyp x1- hs sigmoid colon polyp x1 -hs rectal polyp x1- hs   TONSILLECTOMY       Allergies  Allergen Reactions   Lisinopril Other (See Comments)    wheezing   Bystolic [Nebivolol Hcl] Swelling   Ibuprofen Other (See Comments)    Rectal bleed   Spironolactone Hives   Trulicity [Dulaglutide]     Sob, dry cough, GI   Amlodipine Itching and Other (See Comments)    Leg pain.   Diflucan [Fluconazole] Rash   Hctz [Hydrochlorothiazide] Other (See Comments)    Caused gout flares.   Losartan Rash      Family History  Problem Relation Age of Onset   Colon cancer Mother        died at age 56   Heart attack Father    Hepatitis Sister 29       Autoimmune    Heart attack Paternal Grandfather      Social History Mr. Oguinn reports that he has never smoked. He has never used smokeless tobacco. Mr. Wrightson reports current alcohol use.   Review of Systems CONSTITUTIONAL: No weight loss, fever, chills, weakness or fatigue.  HEENT: Eyes: No visual loss, blurred vision, double vision or yellow sclerae.No hearing loss, sneezing, congestion, runny nose or sore throat.  SKIN: No rash or itching.  CARDIOVASCULAR: per hpi RESPIRATORY: No  shortness of breath, cough or sputum.  GASTROINTESTINAL: No anorexia, nausea, vomiting or diarrhea. No abdominal pain or blood.  GENITOURINARY: No burning on urination, no polyuria NEUROLOGICAL: No headache, dizziness, syncope, paralysis, ataxia, numbness or tingling in the extremities. No change in bowel or bladder control.  MUSCULOSKELETAL: No muscle, back pain, joint pain or stiffness.  LYMPHATICS: No enlarged nodes. No history of splenectomy.  PSYCHIATRIC: No history of depression or anxiety.  ENDOCRINOLOGIC: No reports of sweating, cold or heat intolerance. No polyuria or polydipsia.  Marland Kitchen   Physical Examination Today's Vitals   04/28/22 1350  BP: 129/88  Pulse: 73  SpO2: 97%  Weight: (!) 375 lb 9.6 oz (170.4 kg)  Height: 6' (1.829 m)   Body mass index is 50.94 kg/m.  Gen: resting comfortably, no acute distress HEENT: no scleral icterus, pupils equal round and reactive, no palptable cervical adenopathy,  CV: RRR, no m/rg, no jvd Resp: Clear to auscultation bilaterally GI: abdomen is soft, non-tender, non-distended, normal bowel sounds, no hepatosplenomegaly MSK: extremities are warm, no edema.  Skin: warm, no rash Neuro:  no focal deficits Psych: appropriate affect   Diagnostic Studies     Assessment and Plan   1.HTN - difficult management due to medication side effects -currently at goal, continue current meds   2. CAD - very mild disease by prior cath - no recent symptmos, continue  current meds   3. Afib/acquired thrombophilia -doing well without symptoms, continue current meds including xarelto for stroke prevention    F/u 6 months   Arnoldo Lenis, M.D.

## 2022-04-28 NOTE — Patient Instructions (Signed)
Medication Instructions:  Continue all current medications.  Labwork: none  Testing/Procedures: none  Follow-Up: 6 months   Any Other Special Instructions Will Be Listed Below (If Applicable).  If you need a refill on your cardiac medications before your next appointment, please call your pharmacy.  

## 2022-05-06 DIAGNOSIS — M539 Dorsopathy, unspecified: Secondary | ICD-10-CM | POA: Diagnosis not present

## 2022-05-06 DIAGNOSIS — Z6841 Body Mass Index (BMI) 40.0 and over, adult: Secondary | ICD-10-CM | POA: Diagnosis not present

## 2022-05-06 DIAGNOSIS — R030 Elevated blood-pressure reading, without diagnosis of hypertension: Secondary | ICD-10-CM | POA: Diagnosis not present

## 2022-05-06 DIAGNOSIS — Z79899 Other long term (current) drug therapy: Secondary | ICD-10-CM | POA: Diagnosis not present

## 2022-05-06 DIAGNOSIS — E1165 Type 2 diabetes mellitus with hyperglycemia: Secondary | ICD-10-CM | POA: Diagnosis not present

## 2022-05-06 DIAGNOSIS — E291 Testicular hypofunction: Secondary | ICD-10-CM | POA: Diagnosis not present

## 2022-05-11 ENCOUNTER — Ambulatory Visit: Payer: Medicaid Other | Admitting: Family Medicine

## 2022-05-11 ENCOUNTER — Encounter: Payer: Self-pay | Admitting: Family Medicine

## 2022-05-11 VITALS — BP 125/76 | HR 75 | Temp 97.9°F | Ht 72.0 in | Wt 374.4 lb

## 2022-05-11 DIAGNOSIS — E1159 Type 2 diabetes mellitus with other circulatory complications: Secondary | ICD-10-CM | POA: Diagnosis not present

## 2022-05-11 DIAGNOSIS — I152 Hypertension secondary to endocrine disorders: Secondary | ICD-10-CM | POA: Diagnosis not present

## 2022-05-11 DIAGNOSIS — E1165 Type 2 diabetes mellitus with hyperglycemia: Secondary | ICD-10-CM | POA: Diagnosis not present

## 2022-05-11 DIAGNOSIS — E785 Hyperlipidemia, unspecified: Secondary | ICD-10-CM

## 2022-05-11 DIAGNOSIS — E1169 Type 2 diabetes mellitus with other specified complication: Secondary | ICD-10-CM

## 2022-05-11 LAB — BAYER DCA HB A1C WAIVED: HB A1C (BAYER DCA - WAIVED): 8.3 % — ABNORMAL HIGH (ref 4.8–5.6)

## 2022-05-11 MED ORDER — METFORMIN HCL ER 750 MG PO TB24: 1500.0000 mg | ORAL_TABLET | Freq: Every day | ORAL | 1 refills | Status: DC

## 2022-05-11 NOTE — Progress Notes (Signed)
Subjective:  Patient ID: Bradley Torres, male    DOB: [birth year]  Age: 62 y.o. MRN: [id number]  CC: Medical Management of Chronic Issues  presents forFollow-up of diabetes. Patient checks blood sugar at home.   160-180 fasting Eating meat and veggies mostly. Avoiding bread, potatoes, corn. Not much sweets. Cut way back on soda. Mostly drinking Tea and water.  Patient denies symptoms such as polyuria, polydipsia, excessive hunger, nausea No significant hypoglycemic spells noted. Medications reviewed. Pt reports taking them regularly except took actos 1 week and stopped due to dyspnea, phlegm and cough   History Bradley Torres has a past medical history of CAD (coronary artery disease), Chronic back pain, Diabetes mellitus, type 2 ([place]), DVT (deep venous thrombosis) ([place]), Essential hypertension, MI (myocardial infarction) ([place]) (2019), Morbid obesity ([place]), Panic disorder, Shoulder pain, left, Sleep apnea, Stroke ([place]) (01/2018), and Vitamin D deficiency.   He has a past surgical history that includes Lumbar spine surgery; Ankle fracture sugery (Left); Tonsillectomy; Colonoscopy with propofol (N/A, 05/29/2017); polypectomy (05/29/2017); LEFT HEART CATH AND CORONARY ANGIOGRAPHY (N/A, 01/23/2018); Colonoscopy with propofol (N/A, 07/20/2020); and biopsy (07/20/2020).   His family history includes Colon cancer in his mother; Heart attack in his father and paternal grandfather; Hepatitis (age of onset: 90) in his sister.He reports that he has never smoked. He has never used smokeless tobacco. He reports current alcohol use. He reports that he does not currently use drugs after having used the following drugs: Marijuana.  Current Outpatient Medications on File Prior to Visit  Medication Sig Dispense Refill   Accu-Chek Softclix Lancets lancets Use as instructed to monitor glucose twice daily 100 each 12   allopurinol (ZYLOPRIM) 100 MG tablet Take 1 tablet (100 mg total) by mouth daily. 90 tablet 3   atorvastatin  (LIPITOR) 20 MG tablet Take 1 tablet (20 mg total) by mouth daily. 90 tablet 3   chlorthalidone (HYGROTON) 25 MG tablet Take 1 tablet (25 mg total) by mouth every other day. 45 tablet 3   Cholecalciferol (VITAMIN D3) 250 MCG (10000 UT) capsule Take 10,000 Units by mouth daily.     dapagliflozin propanediol (FARXIGA) 10 MG TABS tablet TAKE ONE TABLET BY MOUTH DAILY BEFORE BREAKFAST 90 tablet 3   DHEA 25 MG CAPS Take by mouth daily.     doxycycline (VIBRA-TABS) 100 MG tablet Take 100 mg by mouth daily.     gabapentin (NEURONTIN) 300 MG capsule Take 1 capsule by mouth daily as needed (pain).     glucose blood (ACCU-CHEK GUIDE) test strip Use as instructed to monitor glucose twice daily 100 each 12   hydrALAZINE (APRESOLINE) 50 MG tablet TAKE ONE TABLET BY MOUTH THREE TIMES DAILY 90 tablet 1   methocarbamol (ROBAXIN) 500 MG tablet Take 1 tablet (500 mg total) by mouth every 8 (eight) hours as needed for muscle spasms. 60 tablet 1   oxyCODONE-acetaminophen (PERCOCET) 10-325 MG tablet Take 1 tablet by mouth 3 (three) times daily as needed for pain.     rivaroxaban (XARELTO) 20 MG TABS tablet TAKE ONE TABLET BY MOUTH DAILY WITH SUPPER 90 tablet 3   sitaGLIPtin (JANUVIA) 100 MG tablet Take 1 tablet (100 mg total) by mouth daily. 90 tablet 3   testosterone cypionate (DEPOTESTOSTERONE CYPIONATE) 200 MG/ML injection Inject 200 mg into the muscle every 14 (fourteen) days.     valsartan (DIOVAN) 160 MG tablet Take 1 tablet (160 mg total) by mouth daily. 90 tablet 1   No current facility-administered medications on file prior to  visit.    ROS Review of Systems  Constitutional:  Negative for fever.  Respiratory:  Negative for shortness of breath.   Cardiovascular:  Negative for chest pain.  Musculoskeletal:  Negative for arthralgias.  Skin:  Negative for rash.    Objective:  BP 125/76   Pulse 75   Temp 97.9 F (36.6 C)   Ht 6' (1.829 m)   Wt (!) 374 lb 6.4 oz (169.8 kg)   SpO2 95%   BMI 50.78  kg/m   BP Readings from Last 3 Encounters:  05/11/22 125/76  04/28/22 129/88  02/14/22 130/81    Wt Readings from Last 3 Encounters:  05/11/22 (!) 374 lb 6.4 oz (169.8 kg)  04/28/22 (!) 375 lb 9.6 oz (170.4 kg)  02/14/22 (!) 374 lb 12.8 oz (170 kg)     Physical Exam Vitals reviewed.  Constitutional:      General: He is not in acute distress.    Appearance: He is well-developed. He is obese. He is not ill-appearing.  HENT:     Head: Normocephalic and atraumatic.     Right Ear: External ear normal.     Left Ear: External ear normal.     Mouth/Throat:     Pharynx: No oropharyngeal exudate or posterior oropharyngeal erythema.  Eyes:     Pupils: Pupils are equal, round, and reactive to light.  Cardiovascular:     Rate and Rhythm: Normal rate and regular rhythm.     Heart sounds: No murmur heard. Pulmonary:     Effort: No respiratory distress.     Breath sounds: Normal breath sounds.  Musculoskeletal:     Cervical back: Normal range of motion and neck supple.  Neurological:     Mental Status: He is alert and oriented to person, place, and time.       Assessment & Plan:   [name] was seen today for medical management of chronic issues.  Diagnoses and all orders for this visit:  Type 2 diabetes mellitus with hyperglycemia, without long-term current use of insulin ([place]) -     Bayer DCA Hb A1c Waived  Hypertension associated with diabetes ([place]) -     CBC with Differential/Platelet -     CMP14+EGFR  Hyperlipidemia associated with type 2 diabetes mellitus ([place]) -     Lipid panel  Morbid (severe) obesity due to excess calories ([place])  Other orders -     metFORMIN (GLUCOPHAGE-XR) 750 MG 24 hr tablet; Take 2 tablets (1,500 mg total) by mouth daily with breakfast.      I have discontinued Bradley Torres's pioglitazone. I have also changed his metFORMIN. Additionally, I am having him maintain his oxyCODONE-acetaminophen, gabapentin, testosterone cypionate,  atorvastatin, Vitamin D3, methocarbamol, DHEA, Accu-Chek Softclix Lancets, chlorthalidone, dapagliflozin propanediol, Accu-Chek Guide, sitaGLIPtin, hydrALAZINE, allopurinol, rivaroxaban, valsartan, and doxycycline.  Meds ordered this encounter  Medications   metFORMIN (GLUCOPHAGE-XR) 750 MG 24 hr tablet    Sig: Take 2 tablets (1,500 mg total) by mouth daily with breakfast.    Dispense:  180 tablet    Refill:  1     Follow-up: Return in about 3 months (around 08/09/2022) for diabetes.  Claretta Fraise, M.D.

## 2022-05-12 LAB — CMP14+EGFR
ALT: 19 IU/L (ref 0–44)
AST: 16 IU/L (ref 0–40)
Albumin/Globulin Ratio: 1.5 (ref 1.2–2.2)
Albumin: 4.2 g/dL (ref 3.8–4.9)
Alkaline Phosphatase: 88 IU/L (ref 44–121)
BUN/Creatinine Ratio: 10 (ref 10–24)
BUN: 15 mg/dL (ref 8–27)
Bilirubin Total: 0.4 mg/dL (ref 0.0–1.2)
CO2: 24 mmol/L (ref 20–29)
Calcium: 10.1 mg/dL (ref 8.6–10.2)
Chloride: 99 mmol/L (ref 96–106)
Creatinine, Ser: 1.44 mg/dL — ABNORMAL HIGH (ref 0.76–1.27)
Globulin, Total: 2.8 g/dL (ref 1.5–4.5)
Glucose: 178 mg/dL — ABNORMAL HIGH (ref 70–99)
Potassium: 4.5 mmol/L (ref 3.5–5.2)
Sodium: 141 mmol/L (ref 134–144)
Total Protein: 7 g/dL (ref 6.0–8.5)
eGFR: 56 mL/min/{1.73_m2} — ABNORMAL LOW (ref >59)

## 2022-05-12 LAB — CBC WITH DIFFERENTIAL/PLATELET
Basophils Absolute: 0.1 10*3/uL (ref 0.0–0.2)
Basos: 1 %
EOS (ABSOLUTE): 0.2 10*3/uL (ref 0.0–0.4)
Eos: 2 %
Hematocrit: 51.4 % — ABNORMAL HIGH (ref 37.5–51.0)
Hemoglobin: 17 g/dL (ref 13.0–17.7)
Immature Grans (Abs): 0.1 10*3/uL (ref 0.0–0.1)
Immature Granulocytes: 1 %
Lymphocytes Absolute: 1 10*3/uL (ref 0.7–3.1)
Lymphs: 10 %
MCH: 31.1 pg (ref 26.6–33.0)
MCHC: 33.1 g/dL (ref 31.5–35.7)
MCV: 94 fL (ref 79–97)
Monocytes Absolute: 0.7 10*3/uL (ref 0.1–0.9)
Monocytes: 7 %
Neutrophils Absolute: 7.7 10*3/uL — ABNORMAL HIGH (ref 1.4–7.0)
Neutrophils: 79 %
Platelets: 305 10*3/uL (ref 150–450)
RBC: 5.46 x10E6/uL (ref 4.14–5.80)
RDW: 12.3 % (ref 11.6–15.4)
WBC: 9.9 10*3/uL (ref 3.4–10.8)

## 2022-05-12 LAB — LIPID PANEL
Chol/HDL Ratio: 3.7 ratio (ref 0.0–5.0)
Cholesterol, Total: 133 mg/dL (ref 100–199)
HDL: 36 mg/dL — ABNORMAL LOW (ref >39)
LDL Chol Calc (NIH): 76 mg/dL (ref 0–99)
Triglycerides: 118 mg/dL (ref 0–149)
VLDL Cholesterol Cal: 21 mg/dL (ref 5–40)

## 2022-05-12 NOTE — Progress Notes (Signed)
Hello Odin,  Your lab result is normal and/or stable.Some minor variations that are not significant are commonly marked abnormal, but do not represent any medical problem for you.  Best regards, Claretta Fraise, M.D.

## 2022-06-03 DIAGNOSIS — M539 Dorsopathy, unspecified: Secondary | ICD-10-CM | POA: Diagnosis not present

## 2022-06-03 DIAGNOSIS — Z79899 Other long term (current) drug therapy: Secondary | ICD-10-CM | POA: Diagnosis not present

## 2022-06-03 DIAGNOSIS — E1165 Type 2 diabetes mellitus with hyperglycemia: Secondary | ICD-10-CM | POA: Diagnosis not present

## 2022-06-03 DIAGNOSIS — E291 Testicular hypofunction: Secondary | ICD-10-CM | POA: Diagnosis not present

## 2022-06-03 DIAGNOSIS — R030 Elevated blood-pressure reading, without diagnosis of hypertension: Secondary | ICD-10-CM | POA: Diagnosis not present

## 2022-06-03 DIAGNOSIS — Z6841 Body Mass Index (BMI) 40.0 and over, adult: Secondary | ICD-10-CM | POA: Diagnosis not present

## 2022-06-06 ENCOUNTER — Other Ambulatory Visit: Payer: Self-pay | Admitting: Cardiology

## 2022-06-18 ENCOUNTER — Encounter (HOSPITAL_COMMUNITY): Payer: Self-pay

## 2022-06-18 ENCOUNTER — Other Ambulatory Visit: Payer: Self-pay

## 2022-06-18 ENCOUNTER — Inpatient Hospital Stay (HOSPITAL_COMMUNITY)
Admission: EM | Admit: 2022-06-18 | Discharge: 2022-06-28 | DRG: 872 | Disposition: A | Discharge status: Home / Self Care | Payer: Medicaid Other | Attending: Internal Medicine | Admitting: Internal Medicine

## 2022-06-18 DIAGNOSIS — E782 Mixed hyperlipidemia: Secondary | ICD-10-CM | POA: Diagnosis present

## 2022-06-18 DIAGNOSIS — Z91199 Patient's noncompliance with other medical treatment and regimen due to unspecified reason: Secondary | ICD-10-CM

## 2022-06-18 DIAGNOSIS — R6520 Severe sepsis without septic shock: Secondary | ICD-10-CM | POA: Diagnosis present

## 2022-06-18 DIAGNOSIS — Z8673 Personal history of transient ischemic attack (TIA), and cerebral infarction without residual deficits: Secondary | ICD-10-CM

## 2022-06-18 DIAGNOSIS — E872 Acidosis, unspecified: Secondary | ICD-10-CM | POA: Diagnosis present

## 2022-06-18 DIAGNOSIS — I2782 Chronic pulmonary embolism: Secondary | ICD-10-CM | POA: Diagnosis present

## 2022-06-18 DIAGNOSIS — E1165 Type 2 diabetes mellitus with hyperglycemia: Secondary | ICD-10-CM | POA: Diagnosis present

## 2022-06-18 DIAGNOSIS — I130 Hypertensive heart and chronic kidney disease with heart failure and stage 1 through stage 4 chronic kidney disease, or unspecified chronic kidney disease: Secondary | ICD-10-CM | POA: Diagnosis present

## 2022-06-18 DIAGNOSIS — I4819 Other persistent atrial fibrillation: Secondary | ICD-10-CM | POA: Diagnosis present

## 2022-06-18 DIAGNOSIS — I5032 Chronic diastolic (congestive) heart failure: Secondary | ICD-10-CM | POA: Diagnosis present

## 2022-06-18 DIAGNOSIS — Z6841 Body Mass Index (BMI) 40.0 and over, adult: Secondary | ICD-10-CM

## 2022-06-18 DIAGNOSIS — N179 Acute kidney failure, unspecified: Secondary | ICD-10-CM | POA: Diagnosis present

## 2022-06-18 DIAGNOSIS — M549 Dorsalgia, unspecified: Secondary | ICD-10-CM | POA: Diagnosis present

## 2022-06-18 DIAGNOSIS — K5720 Diverticulitis of large intestine with perforation and abscess without bleeding: Secondary | ICD-10-CM | POA: Diagnosis present

## 2022-06-18 DIAGNOSIS — Z86711 Personal history of pulmonary embolism: Secondary | ICD-10-CM | POA: Diagnosis present

## 2022-06-18 DIAGNOSIS — R000 Tachycardia, unspecified: Secondary | ICD-10-CM | POA: Diagnosis not present

## 2022-06-18 DIAGNOSIS — Z886 Allergy status to analgesic agent status: Secondary | ICD-10-CM

## 2022-06-18 DIAGNOSIS — K5792 Diverticulitis of intestine, part unspecified, without perforation or abscess without bleeding: Secondary | ICD-10-CM | POA: Diagnosis present

## 2022-06-18 DIAGNOSIS — I252 Old myocardial infarction: Secondary | ICD-10-CM

## 2022-06-18 DIAGNOSIS — Z79899 Other long term (current) drug therapy: Secondary | ICD-10-CM

## 2022-06-18 DIAGNOSIS — I2510 Atherosclerotic heart disease of native coronary artery without angina pectoris: Secondary | ICD-10-CM | POA: Diagnosis present

## 2022-06-18 DIAGNOSIS — A419 Sepsis, unspecified organism: Principal | ICD-10-CM | POA: Diagnosis present

## 2022-06-18 DIAGNOSIS — N1832 Chronic kidney disease, stage 3b: Secondary | ICD-10-CM | POA: Diagnosis present

## 2022-06-18 DIAGNOSIS — Z800 Family history of malignant neoplasm of digestive organs: Secondary | ICD-10-CM

## 2022-06-18 DIAGNOSIS — E871 Hypo-osmolality and hyponatremia: Secondary | ICD-10-CM | POA: Diagnosis present

## 2022-06-18 DIAGNOSIS — I4891 Unspecified atrial fibrillation: Secondary | ICD-10-CM

## 2022-06-18 DIAGNOSIS — Z86718 Personal history of other venous thrombosis and embolism: Secondary | ICD-10-CM

## 2022-06-18 DIAGNOSIS — K5732 Diverticulitis of large intestine without perforation or abscess without bleeding: Secondary | ICD-10-CM | POA: Insufficient documentation

## 2022-06-18 DIAGNOSIS — N189 Chronic kidney disease, unspecified: Secondary | ICD-10-CM | POA: Diagnosis present

## 2022-06-18 DIAGNOSIS — Z7901 Long term (current) use of anticoagulants: Secondary | ICD-10-CM

## 2022-06-18 DIAGNOSIS — E876 Hypokalemia: Secondary | ICD-10-CM | POA: Diagnosis present

## 2022-06-18 DIAGNOSIS — Z7984 Long term (current) use of oral hypoglycemic drugs: Secondary | ICD-10-CM

## 2022-06-18 DIAGNOSIS — G8929 Other chronic pain: Secondary | ICD-10-CM | POA: Diagnosis present

## 2022-06-18 DIAGNOSIS — Z8249 Family history of ischemic heart disease and other diseases of the circulatory system: Secondary | ICD-10-CM

## 2022-06-18 DIAGNOSIS — G4733 Obstructive sleep apnea (adult) (pediatric): Secondary | ICD-10-CM | POA: Diagnosis present

## 2022-06-18 DIAGNOSIS — I2692 Saddle embolus of pulmonary artery without acute cor pulmonale: Secondary | ICD-10-CM | POA: Diagnosis present

## 2022-06-18 DIAGNOSIS — E1122 Type 2 diabetes mellitus with diabetic chronic kidney disease: Secondary | ICD-10-CM | POA: Diagnosis present

## 2022-06-18 DIAGNOSIS — R9431 Abnormal electrocardiogram [ECG] [EKG]: Secondary | ICD-10-CM | POA: Insufficient documentation

## 2022-06-18 DIAGNOSIS — Z888 Allergy status to other drugs, medicaments and biological substances status: Secondary | ICD-10-CM

## 2022-06-18 NOTE — ED Triage Notes (Signed)
Pt endorses LUQ abdominal pain that began around 11 am today. Some nausea with onset of pain. States pain is sharp and severe in nature. Pain increased with movement or ambulation.   Denies injury. No OTC medications PTA.   Pt A & O in triage, short answers to questions, repeatedly stating he wants to lay down.

## 2022-06-19 ENCOUNTER — Emergency Department (HOSPITAL_COMMUNITY): Payer: Medicaid Other

## 2022-06-19 DIAGNOSIS — K8020 Calculus of gallbladder without cholecystitis without obstruction: Secondary | ICD-10-CM | POA: Diagnosis not present

## 2022-06-19 DIAGNOSIS — N189 Chronic kidney disease, unspecified: Secondary | ICD-10-CM | POA: Diagnosis not present

## 2022-06-19 DIAGNOSIS — E1165 Type 2 diabetes mellitus with hyperglycemia: Secondary | ICD-10-CM | POA: Diagnosis not present

## 2022-06-19 DIAGNOSIS — I4891 Unspecified atrial fibrillation: Secondary | ICD-10-CM | POA: Diagnosis not present

## 2022-06-19 DIAGNOSIS — A419 Sepsis, unspecified organism: Secondary | ICD-10-CM | POA: Diagnosis not present

## 2022-06-19 DIAGNOSIS — Z6841 Body Mass Index (BMI) 40.0 and over, adult: Secondary | ICD-10-CM | POA: Diagnosis not present

## 2022-06-19 DIAGNOSIS — E876 Hypokalemia: Secondary | ICD-10-CM | POA: Diagnosis not present

## 2022-06-19 DIAGNOSIS — K5720 Diverticulitis of large intestine with perforation and abscess without bleeding: Secondary | ICD-10-CM

## 2022-06-19 DIAGNOSIS — K5792 Diverticulitis of intestine, part unspecified, without perforation or abscess without bleeding: Secondary | ICD-10-CM | POA: Diagnosis present

## 2022-06-19 DIAGNOSIS — E782 Mixed hyperlipidemia: Secondary | ICD-10-CM | POA: Diagnosis not present

## 2022-06-19 DIAGNOSIS — I2699 Other pulmonary embolism without acute cor pulmonale: Secondary | ICD-10-CM | POA: Insufficient documentation

## 2022-06-19 DIAGNOSIS — E872 Acidosis, unspecified: Secondary | ICD-10-CM | POA: Diagnosis not present

## 2022-06-19 DIAGNOSIS — N179 Acute kidney failure, unspecified: Secondary | ICD-10-CM | POA: Diagnosis not present

## 2022-06-19 DIAGNOSIS — I2510 Atherosclerotic heart disease of native coronary artery without angina pectoris: Secondary | ICD-10-CM | POA: Diagnosis not present

## 2022-06-19 DIAGNOSIS — Z86718 Personal history of other venous thrombosis and embolism: Secondary | ICD-10-CM | POA: Diagnosis not present

## 2022-06-19 DIAGNOSIS — Z86711 Personal history of pulmonary embolism: Secondary | ICD-10-CM

## 2022-06-19 DIAGNOSIS — R000 Tachycardia, unspecified: Secondary | ICD-10-CM | POA: Diagnosis not present

## 2022-06-19 DIAGNOSIS — I130 Hypertensive heart and chronic kidney disease with heart failure and stage 1 through stage 4 chronic kidney disease, or unspecified chronic kidney disease: Secondary | ICD-10-CM | POA: Diagnosis not present

## 2022-06-19 DIAGNOSIS — G4733 Obstructive sleep apnea (adult) (pediatric): Secondary | ICD-10-CM

## 2022-06-19 DIAGNOSIS — R0602 Shortness of breath: Secondary | ICD-10-CM | POA: Diagnosis not present

## 2022-06-19 DIAGNOSIS — K5732 Diverticulitis of large intestine without perforation or abscess without bleeding: Secondary | ICD-10-CM | POA: Diagnosis not present

## 2022-06-19 DIAGNOSIS — N1832 Chronic kidney disease, stage 3b: Secondary | ICD-10-CM | POA: Diagnosis not present

## 2022-06-19 DIAGNOSIS — I2782 Chronic pulmonary embolism: Secondary | ICD-10-CM | POA: Diagnosis not present

## 2022-06-19 DIAGNOSIS — I480 Paroxysmal atrial fibrillation: Secondary | ICD-10-CM | POA: Diagnosis not present

## 2022-06-19 DIAGNOSIS — M549 Dorsalgia, unspecified: Secondary | ICD-10-CM | POA: Diagnosis not present

## 2022-06-19 DIAGNOSIS — Z79899 Other long term (current) drug therapy: Secondary | ICD-10-CM | POA: Diagnosis not present

## 2022-06-19 DIAGNOSIS — E871 Hypo-osmolality and hyponatremia: Secondary | ICD-10-CM | POA: Diagnosis not present

## 2022-06-19 DIAGNOSIS — K760 Fatty (change of) liver, not elsewhere classified: Secondary | ICD-10-CM | POA: Diagnosis not present

## 2022-06-19 DIAGNOSIS — R9431 Abnormal electrocardiogram [ECG] [EKG]: Secondary | ICD-10-CM | POA: Diagnosis not present

## 2022-06-19 DIAGNOSIS — I5032 Chronic diastolic (congestive) heart failure: Secondary | ICD-10-CM | POA: Diagnosis not present

## 2022-06-19 DIAGNOSIS — R6520 Severe sepsis without septic shock: Secondary | ICD-10-CM

## 2022-06-19 DIAGNOSIS — I252 Old myocardial infarction: Secondary | ICD-10-CM | POA: Diagnosis not present

## 2022-06-19 DIAGNOSIS — N281 Cyst of kidney, acquired: Secondary | ICD-10-CM | POA: Diagnosis not present

## 2022-06-19 DIAGNOSIS — E1122 Type 2 diabetes mellitus with diabetic chronic kidney disease: Secondary | ICD-10-CM | POA: Diagnosis not present

## 2022-06-19 DIAGNOSIS — Z7901 Long term (current) use of anticoagulants: Secondary | ICD-10-CM | POA: Diagnosis not present

## 2022-06-19 DIAGNOSIS — G8929 Other chronic pain: Secondary | ICD-10-CM | POA: Diagnosis not present

## 2022-06-19 DIAGNOSIS — I4819 Other persistent atrial fibrillation: Secondary | ICD-10-CM | POA: Diagnosis not present

## 2022-06-19 LAB — URINALYSIS, ROUTINE W REFLEX MICROSCOPIC
Bacteria, UA: NONE SEEN
Bilirubin Urine: NEGATIVE
Glucose, UA: >=500 mg/dL — AB
Hgb urine dipstick: NEGATIVE
Ketones, ur: 5 mg/dL — AB
Leukocytes,Ua: NEGATIVE
Nitrite: NEGATIVE
Protein, ur: NEGATIVE mg/dL
Specific Gravity, Urine: 1.04 — ABNORMAL HIGH (ref 1.005–1.030)
pH: 5 (ref 5.0–8.0)

## 2022-06-19 LAB — CBC
HCT: 50 % (ref 39.0–52.0)
Hemoglobin: 16.5 g/dL (ref 13.0–17.0)
MCH: 31.7 pg (ref 26.0–34.0)
MCHC: 33 g/dL (ref 30.0–36.0)
MCV: 96 fL (ref 80.0–100.0)
Platelets: 275 10*3/uL (ref 150–400)
RBC: 5.21 MIL/uL (ref 4.22–5.81)
RDW: 13.6 % (ref 11.5–15.5)
WBC: 20.5 10*3/uL — ABNORMAL HIGH (ref 4.0–10.5)
nRBC: 0 % (ref 0.0–0.2)

## 2022-06-19 LAB — HIV ANTIBODY (ROUTINE TESTING W REFLEX): HIV Screen 4th Generation wRfx: NONREACTIVE

## 2022-06-19 LAB — GLUCOSE, CAPILLARY
Glucose-Capillary: 173 mg/dL — ABNORMAL HIGH (ref 70–99)
Glucose-Capillary: 155 mg/dL — ABNORMAL HIGH (ref 70–99)
Glucose-Capillary: 127 mg/dL — ABNORMAL HIGH (ref 70–99)

## 2022-06-19 LAB — COMPREHENSIVE METABOLIC PANEL
ALT: 22 U/L (ref 0–44)
AST: 23 U/L (ref 15–41)
Albumin: 3.4 g/dL — ABNORMAL LOW (ref 3.5–5.0)
Alkaline Phosphatase: 56 U/L (ref 38–126)
Anion gap: 13 (ref 5–15)
BUN: 22 mg/dL — ABNORMAL HIGH (ref 6–20)
CO2: 24 mmol/L (ref 22–32)
Calcium: 8.9 mg/dL (ref 8.9–10.3)
Chloride: 96 mmol/L — ABNORMAL LOW (ref 98–111)
Creatinine, Ser: 1.82 mg/dL — ABNORMAL HIGH (ref 0.61–1.24)
GFR, Estimated: 42 mL/min — ABNORMAL LOW (ref >60)
Glucose, Bld: 212 mg/dL — ABNORMAL HIGH (ref 70–99)
Potassium: 3.8 mmol/L (ref 3.5–5.1)
Sodium: 133 mmol/L — ABNORMAL LOW (ref 135–145)
Total Bilirubin: 1.2 mg/dL (ref 0.3–1.2)
Total Protein: 7.1 g/dL (ref 6.5–8.1)

## 2022-06-19 LAB — LACTIC ACID, PLASMA
Lactic Acid, Venous: 4 mmol/L (ref 0.5–1.9)
Lactic Acid, Venous: 1.7 mmol/L (ref 0.5–1.9)
Lactic Acid, Venous: 2.1 mmol/L (ref 0.5–1.9)

## 2022-06-19 LAB — CBG MONITORING, ED
Glucose-Capillary: 182 mg/dL — ABNORMAL HIGH (ref 70–99)
Glucose-Capillary: 204 mg/dL — ABNORMAL HIGH (ref 70–99)

## 2022-06-19 LAB — MAGNESIUM: Magnesium: 1.6 mg/dL — ABNORMAL LOW (ref 1.7–2.4)

## 2022-06-19 LAB — LIPASE, BLOOD: Lipase: 28 U/L (ref 11–51)

## 2022-06-19 LAB — PHOSPHORUS: Phosphorus: 3.4 mg/dL (ref 2.5–4.6)

## 2022-06-19 MED ORDER — MAGNESIUM SULFATE 2 GM/50ML IV SOLN
2.0000 g | Freq: Once | INTRAVENOUS | Status: AC
  Administered 2022-06-19: 2 g via INTRAVENOUS
  Filled 2022-06-19: qty 50

## 2022-06-19 MED ORDER — HYDROMORPHONE HCL 1 MG/ML IJ SOLN
1.0000 mg | Freq: Once | INTRAMUSCULAR | Status: AC
  Administered 2022-06-19: 1 mg via INTRAVENOUS
  Filled 2022-06-19: qty 1

## 2022-06-19 MED ORDER — PROCHLORPERAZINE EDISYLATE 10 MG/2ML IJ SOLN: 10.0000 mg | Freq: Four times a day (QID) | INTRAMUSCULAR | Status: DC | PRN

## 2022-06-19 MED ORDER — SODIUM CHLORIDE 0.9 % IV SOLN
2.0000 g | Freq: Once | INTRAVENOUS | Status: AC
  Administered 2022-06-19: 2 g via INTRAVENOUS
  Filled 2022-06-19: qty 12.5

## 2022-06-19 MED ORDER — ATORVASTATIN CALCIUM 20 MG PO TABS
20.0000 mg | ORAL_TABLET | Freq: Every day | ORAL | Status: DC
  Administered 2022-06-19 – 2022-06-28 (×10): 20 mg via ORAL
  Filled 2022-06-19: qty 2
  Filled 2022-06-19 (×9): qty 1

## 2022-06-19 MED ORDER — ONDANSETRON HCL 4 MG/2ML IJ SOLN
4.0000 mg | Freq: Once | INTRAMUSCULAR | Status: AC
  Administered 2022-06-19: 4 mg via INTRAVENOUS
  Filled 2022-06-19: qty 2

## 2022-06-19 MED ORDER — METRONIDAZOLE 500 MG/100ML IV SOLN
500.0000 mg | Freq: Once | INTRAVENOUS | Status: AC
  Administered 2022-06-19: 500 mg via INTRAVENOUS
  Filled 2022-06-19: qty 100

## 2022-06-19 MED ORDER — PIPERACILLIN-TAZOBACTAM 3.375 G IVPB
3.3750 g | Freq: Three times a day (TID) | INTRAVENOUS | Status: DC
  Administered 2022-06-19 – 2022-06-27 (×25): 3.375 g via INTRAVENOUS
  Filled 2022-06-19 (×25): qty 50

## 2022-06-19 MED ORDER — LACTATED RINGERS IV BOLUS (SEPSIS)
400.0000 mL | Freq: Once | INTRAVENOUS | Status: AC
  Administered 2022-06-19: 400 mL via INTRAVENOUS

## 2022-06-19 MED ORDER — SODIUM CHLORIDE 0.9 % IV BOLUS
500.0000 mL | Freq: Once | INTRAVENOUS | Status: AC
  Administered 2022-06-19: 500 mL via INTRAVENOUS

## 2022-06-19 MED ORDER — LACTATED RINGERS IV SOLN: INTRAVENOUS | Status: AC

## 2022-06-19 MED ORDER — HYDROMORPHONE HCL 1 MG/ML IJ SOLN
0.5000 mg | INTRAMUSCULAR | Status: DC | PRN
  Administered 2022-06-19 – 2022-06-21 (×13): 0.5 mg via INTRAVENOUS
  Filled 2022-06-19 (×12): qty 0.5

## 2022-06-19 MED ORDER — IOHEXOL 300 MG/ML  SOLN
80.0000 mL | Freq: Once | INTRAMUSCULAR | Status: AC | PRN
  Administered 2022-06-19: 80 mL via INTRAVENOUS

## 2022-06-19 MED ORDER — ACETAMINOPHEN 325 MG PO TABS
650.0000 mg | ORAL_TABLET | Freq: Four times a day (QID) | ORAL | Status: DC | PRN
  Administered 2022-06-24: 650 mg via ORAL
  Filled 2022-06-19 (×2): qty 2

## 2022-06-19 MED ORDER — LACTATED RINGERS IV BOLUS (SEPSIS)
1000.0000 mL | Freq: Once | INTRAVENOUS | Status: AC
  Administered 2022-06-19: 1000 mL via INTRAVENOUS

## 2022-06-19 MED ORDER — ACETAMINOPHEN 650 MG RE SUPP: 650.0000 mg | Freq: Four times a day (QID) | RECTAL | Status: DC | PRN

## 2022-06-19 MED ORDER — INSULIN ASPART 100 UNIT/ML IJ SOLN
0.0000 [IU] | INTRAMUSCULAR | Status: DC
  Administered 2022-06-19: 2 [IU] via SUBCUTANEOUS
  Administered 2022-06-19: 3 [IU] via SUBCUTANEOUS
  Administered 2022-06-19: 2 [IU] via SUBCUTANEOUS
  Administered 2022-06-20: 1 [IU] via SUBCUTANEOUS
  Administered 2022-06-20: 2 [IU] via SUBCUTANEOUS
  Administered 2022-06-20: 1 [IU] via SUBCUTANEOUS
  Administered 2022-06-20: 2 [IU] via SUBCUTANEOUS
  Administered 2022-06-20 (×3): 1 [IU] via SUBCUTANEOUS
  Administered 2022-06-21 (×2): 2 [IU] via SUBCUTANEOUS
  Administered 2022-06-21: 1 [IU] via SUBCUTANEOUS
  Administered 2022-06-21 (×3): 2 [IU] via SUBCUTANEOUS
  Administered 2022-06-22: 1 [IU] via SUBCUTANEOUS
  Administered 2022-06-22 (×4): 2 [IU] via SUBCUTANEOUS
  Administered 2022-06-23: 1 [IU] via SUBCUTANEOUS
  Administered 2022-06-23 (×2): 2 [IU] via SUBCUTANEOUS
  Administered 2022-06-23: 1 [IU] via SUBCUTANEOUS
  Administered 2022-06-23: 2 [IU] via SUBCUTANEOUS
  Administered 2022-06-24: 3 [IU] via SUBCUTANEOUS
  Administered 2022-06-24: 2 [IU] via SUBCUTANEOUS
  Administered 2022-06-24 (×2): 1 [IU] via SUBCUTANEOUS
  Administered 2022-06-24: 2 [IU] via SUBCUTANEOUS
  Administered 2022-06-24: 1 [IU] via SUBCUTANEOUS
  Administered 2022-06-25 (×3): 2 [IU] via SUBCUTANEOUS
  Administered 2022-06-25 – 2022-06-26 (×4): 1 [IU] via SUBCUTANEOUS
  Administered 2022-06-26 – 2022-06-27 (×5): 2 [IU] via SUBCUTANEOUS
  Administered 2022-06-27: 3 [IU] via SUBCUTANEOUS
  Administered 2022-06-27 (×2): 1 [IU] via SUBCUTANEOUS
  Administered 2022-06-28: 2 [IU] via SUBCUTANEOUS
  Administered 2022-06-28 (×4): 1 [IU] via SUBCUTANEOUS
  Filled 2022-06-19: qty 1

## 2022-06-19 NOTE — Progress Notes (Signed)
Patient admitted after midnight, please see H&P.  Lactic acid improving.  Patient resting comfortable on CPAP. Continue IV Abx and general surgery to see. Eulogio Bear DO

## 2022-06-19 NOTE — Sepsis Progress Note (Addendum)
Confirmed with provider via secure chat that IBW was used to calculate fluid resuscitation. Will continue to monitor code sepsis.

## 2022-06-19 NOTE — Sepsis Progress Note (Signed)
Elink following code sepsis °

## 2022-06-19 NOTE — ED Notes (Signed)
Date and time results received: 06/19/22 0248   Test: LACTIC Critical Value: 4.0  Name of Provider Notified: Betsey Holiday, MD

## 2022-06-19 NOTE — Consult Note (Signed)
Reason for Consult: Diverticulitis with perforation Referring Physician: Dr. Hanley Bradley Torres is an 62 y.o. male.  HPI: Patient is a 62 year old white male with multiple comorbidities including morbid obesity, hypertension, history of NSTEMI, history of DVT and pulmonary embolus on Xarelto, history of stroke, chronic kidney disease, and diverticulosis who presented to the emergency room with worsening left lower quadrant abdominal pain.  He denies any fever, chills, diarrhea, or constipation.  CT scan of the abdomen revealed sigmoid diverticulitis with pockets of free air throughout the abdomen.  No abscess cavity noted.  Patient states he has had diverticulitis in the past but it was treated as an outpatient.  He has a known history of diverticulosis by colonoscopy performed in 2022.  He was noted to have a mildly elevated lactic acid level with a leukocytosis.  He has been started on IV Zosyn.  This morning, he states his pain has not worsened.  He denies any nausea or vomiting.  Past Medical History:  Diagnosis Date   CAD (coronary artery disease)    a. 12/2017: cath showing 10% Proximal-LAD stenosis with no significant obstructive disease and LVEDP mildly elevated at 18 mm Hg.    Chronic back pain    Diabetes mellitus, type 2 (H[facility])    DVT (deep venous thrombosis) (H[facility])    Essential hypertension    MI (myocardial infarction) ([place]) 2019   Morbid obesity ([place])    Panic disorder    Shoulder pain, left    Following fall   Sleep apnea    Stroke ([place]) 01/2018   Vitamin D deficiency     Past Surgical History:  Procedure Laterality Date   Ankle fracture sugery Left    BIOPSY  07/20/2020   Procedure: BIOPSY;  Surgeon: Daneil Dolin, MD;  [facility];  Service: Endoscopy;;  cecal polyp   COLONOSCOPY WITH PROPOFOL N/A 05/29/2017   Dr. [name]: Multiple tubular adenomas removed, prep inadequate.  Short interval surveillance colonoscopy recommended in 6 months   COLONOSCOPY WITH  PROPOFOL N/A 07/20/2020   Procedure: COLONOSCOPY WITH PROPOFOL;  Surgeon: Daneil Dolin, MD;  [facility];  Service: Endoscopy;  Laterality: N/A;  am appt, early as possible since diabetic   LEFT HEART CATH AND CORONARY ANGIOGRAPHY N/A 01/23/2018   Procedure: LEFT HEART CATH AND CORONARY ANGIOGRAPHY;  Surgeon: Troy Sine, MD;  Location: [facility];  Service: Cardiovascular;  Laterality: N/A;   LUMBAR SPINE SURGERY     fusion   POLYPECTOMY  05/29/2017   Procedure: POLYPECTOMY;  Surgeon: Daneil Dolin, MD;  [facility];  Service: Endoscopy;;  ascending colon polyp x5-cs transverse colon polyp x4- cs descending colon polyp x1- hs sigmoid colon polyp x1 -hs rectal polyp x1- hs   TONSILLECTOMY      Family History  Problem Relation Age of Onset   Colon cancer Mother        died at age 62   Heart attack Father    Hepatitis Sister 14       Autoimmune   Heart attack Paternal Grandfather     Social History:  reports that he has never smoked. He has never used smokeless tobacco. He reports current alcohol use. He reports that he does not currently use drugs after having used the following drugs: Marijuana.  Allergies:  Allergies  Allergen Reactions   Actos [Pioglitazone] Cough    Some dyspnea & edema ( possibly some CHF?)   Lisinopril Other (  See Comments)    wheezing   Bystolic [Nebivolol Hcl] Swelling   Ibuprofen Other (See Comments)    Rectal bleed   Spironolactone Hives   Trulicity [Dulaglutide]     Sob, dry cough, GI   Amlodipine Itching and Other (See Comments)    Leg pain.   Diflucan [Fluconazole] Rash   Hctz [Hydrochlorothiazide] Other (See Comments)    Caused gout flares.   Losartan Rash    Medications: I have reviewed the patient's current medications. Prior to Admission: (Not in a hospital admission)   Results for orders placed or performed during the hospital encounter of 06/18/22 (from the past 48 hour(s))  Lipase, blood      Status: None   Collection Time: 06/19/22 12:09 AM  Result Value Ref Range   Lipase 28 11 - 51 U/L    Comment: Performed at [facility], [address] [place], [place] [address]  Comprehensive metabolic panel     Status: Abnormal   Collection Time: 06/19/22 12:09 AM  Result Value Ref Range   Sodium 133 (L) 135 - 145 mmol/L   Potassium 3.8 3.5 - 5.1 mmol/L   Chloride 96 (L) 98 - 111 mmol/L   CO2 24 22 - 32 mmol/L   Glucose, Bld 212 (H) 70 - 99 mg/dL    Comment: Glucose reference range applies only to samples taken after fasting for at least 8 hours.   BUN 22 (H) 6 - 20 mg/dL   Creatinine, Ser 1.82 (H) 0.61 - 1.24 mg/dL   Calcium 8.9 8.9 - 10.3 mg/dL   Total Protein 7.1 6.5 - 8.1 g/dL   Albumin 3.4 (L) 3.5 - 5.0 g/dL   AST 23 15 - 41 U/L   ALT 22 0 - 44 U/L   Alkaline Phosphatase 56 38 - 126 U/L   Total Bilirubin 1.2 0.3 - 1.2 mg/dL   GFR, Estimated 42 (L) >60 mL/min    Comment: (NOTE) Calculated using the CKD-EPI Creatinine Equation (2021)    Anion gap 13 5 - 15    Comment: Performed at [facility], [address], [place], [place] [address]  CBC     Status: Abnormal   Collection Time: 06/19/22 12:09 AM  Result Value Ref Range   WBC 20.5 (H) 4.0 - 10.5 K/uL   RBC 5.21 4.22 - 5.81 MIL/uL   Hemoglobin 16.5 13.0 - 17.0 g/dL   HCT 50.0 39.0 - 52.0 %   MCV 96.0 80.0 - 100.0 fL   MCH 31.7 26.0 - 34.0 pg   MCHC 33.0 30.0 - 36.0 g/dL   RDW 13.6 11.5 - 15.5 %   Platelets 275 150 - 400 K/uL   nRBC 0.0 0.0 - 0.2 %    Comment: Performed at [facility], [address], [place], [place] [address]  Culture, blood (single)     Status: None (Preliminary result)   Collection Time: 06/19/22  1:53 AM   Specimen: BLOOD LEFT ARM  Result Value Ref Range   Specimen Description BLOOD LEFT ARM    Special Requests      BOTTLES DRAWN AEROBIC AND ANAEROBIC Blood Culture adequate volume Performed at [facility], [address] [place], [place] [address]    Culture PENDING    Report Status  PENDING   Lactic acid, plasma     Status: Abnormal   Collection Time: 06/19/22  1:53 AM  Result Value Ref Range   Lactic Acid, Venous 4.0 (HH) 0.5 - 1.9 mmol/L    Comment:  CRITICAL RESULT CALLED TO, READ BACK BY AND VERIFIED WITH SAPPELT,J @ 0248 ON 06/19/22 BY JUW Performed at [facility], [address], [place], [place] [address]   Urinalysis, Routine w reflex microscopic -Urine, Clean Catch     Status: Abnormal   Collection Time: 06/19/22  4:37 AM  Result Value Ref Range   Color, Urine YELLOW YELLOW   APPearance CLEAR CLEAR   Specific Gravity, Urine 1.040 (H) 1.005 - 1.030   pH 5.0 5.0 - 8.0   Glucose, UA >=500 (A) NEGATIVE mg/dL   Hgb urine dipstick NEGATIVE NEGATIVE   Bilirubin Urine NEGATIVE NEGATIVE   Ketones, ur 5 (A) NEGATIVE mg/dL   Protein, ur NEGATIVE NEGATIVE mg/dL   Nitrite NEGATIVE NEGATIVE   Leukocytes,Ua NEGATIVE NEGATIVE   RBC / HPF 0-5 0 - 5 RBC/hpf   WBC, UA 0-5 0 - 5 WBC/hpf   Bacteria, UA NONE SEEN NONE SEEN   Squamous Epithelial / HPF 0-5 0 - 5 /HPF   Mucus PRESENT     Comment: Performed at [facility], [address] [place], [place] [address]  Lactic acid, plasma     Status: None   Collection Time: 06/19/22  4:47 AM  Result Value Ref Range   Lactic Acid, Venous 1.7 0.5 - 1.9 mmol/L    Comment: Performed at [facility], [address], [place], [place] [address]  Lactic acid, plasma     Status: Abnormal   Collection Time: 06/19/22  6:30 AM  Result Value Ref Range   Lactic Acid, Venous 2.1 (HH) 0.5 - 1.9 mmol/L    Comment: CRITICAL RESULT CALLED TO, READ BACK BY AND VERIFIED WITH K.BARRON @ 0710 BY STEPHTR 06/19/22 Performed at [facility], [address], [place], [place] [address]   Magnesium     Status: Abnormal   Collection Time: 06/19/22  6:30 AM  Result Value Ref Range   Magnesium 1.6 (L) 1.7 - 2.4 mg/dL    Comment: Performed at [facility], [address] [place], [place] [address]  Phosphorus     Status: None   Collection Time: 06/19/22   6:30 AM  Result Value Ref Range   Phosphorus 3.4 2.5 - 4.6 mg/dL    Comment: Performed at [facility], [address] [place], [place] [address]  CBG monitoring, ED     Status: Abnormal   Collection Time: 06/19/22  9:58 AM  Result Value Ref Range   Glucose-Capillary 182 (H) 70 - 99 mg/dL    Comment: Glucose reference range applies only to samples taken after fasting for at least 8 hours.    CT ABDOMEN PELVIS W CONTRAST  Result Date: 06/19/2022 CLINICAL DATA:  Left lower quadrant pain for several hours, initial encounter EXAM: CT ABDOMEN AND PELVIS WITH CONTRAST TECHNIQUE: Multidetector CT imaging of the abdomen and pelvis was performed using the standard protocol following bolus administration of intravenous contrast. RADIATION DOSE REDUCTION: This exam was performed according to the departmental dose-optimization program which includes automated exposure control, adjustment of the mA and/or kV according to patient size and/or use of iterative reconstruction technique. CONTRAST:  65mL OMNIPAQUE IOHEXOL 300 MG/ML  SOLN COMPARISON:  01/19/2018 FINDINGS: Lower chest: No acute abnormality. Hepatobiliary: Mild fatty infiltration of the liver is noted. The gallbladder is well distended. Dependent gallstones are seen without complicating factors. Pancreas: Unremarkable. No pancreatic ductal dilatation or surrounding inflammatory changes. Spleen: Normal in size without focal abnormality. Adrenals/Urinary Tract: Adrenal glands are within normal limits bilaterally. Kidneys demonstrate no renal calculi  or obstructive changes. Stable upper pole renal cyst on the right is noted. No follow-up is recommended. No obstructive changes are seen. The bladder is partially distended. Stomach/Bowel: There are changes consistent with diverticulitis within the sigmoid colon. Few small pericolonic areas of free air are noted consistent with acute perforation. Multiple locules of free air are noted throughout the abdomen. No  obstructive changes are seen. The appendix is within normal limits. Small bowel and stomach are unremarkable. Vascular/Lymphatic: Aortic atherosclerosis. No enlarged abdominal or pelvic lymph nodes. Reproductive: Prostate is unremarkable. Other: No abdominal wall hernia or abnormality. No abdominopelvic ascites. Musculoskeletal: Postsurgical changes are noted in the lower lumbar spine. IMPRESSION: Changes consistent with sigmoid diverticulitis with associated perforation and scattered free air throughout the abdomen. Cholelithiasis without complicating factors. Fatty liver. Critical Value/emergent results were called by telephone at the time of interpretation on 06/19/2022 at 1:21 am to Dr. Joseph Berkshire , who verbally acknowledged these results. Electronically Signed   By: Inez Catalina M.D.   On: 06/19/2022 01:23    ROS:  Pertinent items are noted in HPI.  Blood pressure 131/86, pulse (!) 51, temperature 97.7 F (36.5 C), temperature source Oral, resp. rate (!) 22, height 6' (1.829 m), weight (!) 167.8 kg, SpO2 91 %. Physical Exam: Pleasant white male sitting in a chair no acute distress Head is normocephalic, atraumatic Lungs clear to auscultation with equal breath sounds bilaterally Heart examination reveals a regular rate with PACs. Abdomen is soft with a significant amount of pannus present.  He is only tender in the left lower quadrant.  He denies any tenderness along the right side of his abdomen or in the epigastric region.  No rigidity is noted.  No diffuse peritoneal signs are noted CT scan images personally reviewed Assessment/Plan: Impression: Sigmoid diverticulitis with perforation.  Patient has only localized peritonitis and does not have diffuse peritonitis.  He is at significant risk for any surgical intervention requiring general anesthesia.  Patient does not want surgery unless absolutely necessary.  Will attempt to treat this medically.  Should he require surgical intervention,  this may need to be performed at a tertiary care center.  May have sips of clears.  May require CT scan of abdomen follow-up in 3 to 4 days.  Will follow with you.  Aviva Signs 06/19/2022, 11:49 AM

## 2022-06-19 NOTE — ED Provider Notes (Signed)
[facility] Provider Note   CSN: [id number] Arrival date & time: 06/18/22  2026     History {Add pertinent medical, surgical, social history, OB history to HPI:1} Chief Complaint  Patient presents with   Abdominal Pain    KOBYN MEDAL is a 62 y.o. male.  Patient presents to the emergency department for evaluation of left lower abdominal pain.  Patient reports that the pain had suddenly around 11 PM this morning.  Pain has been persistent all day and is getting worse.  No fever, vomiting, diarrhea.  He has not noticed any urinary symptoms.       Home Medications Prior to Admission medications   Medication Sig Start Date End Date Taking? Authorizing Provider  Accu-Chek Softclix Lancets lancets Use as instructed to monitor glucose twice daily 09/23/21   Brita Romp, NP  allopurinol (ZYLOPRIM) 100 MG tablet Take 1 tablet (100 mg total) by mouth daily. 02/14/22   Claretta Fraise, MD  atorvastatin (LIPITOR) 20 MG tablet Take 1 tablet (20 mg total) by mouth daily. 08/05/21   Skeet Latch, MD  chlorthalidone (HYGROTON) 25 MG tablet Take 1 tablet (25 mg total) by mouth every other day. 10/25/21   Arnoldo Lenis, MD  Cholecalciferol (VITAMIN D3) 250 MCG (10000 UT) capsule Take 10,000 Units by mouth daily.    [provider]  dapagliflozin propanediol (FARXIGA) 10 MG TABS tablet TAKE ONE TABLET BY MOUTH DAILY BEFORE BREAKFAST 10/28/21   Brita Romp, NP  DHEA 25 MG CAPS Take by mouth daily.    [provider]  doxycycline (VIBRA-TABS) 100 MG tablet Take 100 mg by mouth daily. 05/07/22   [provider]  gabapentin (NEURONTIN) 300 MG capsule Take 1 capsule by mouth daily as needed (pain). 04/22/20   [provider]  glucose blood (ACCU-CHEK GUIDE) test strip Use as instructed to monitor glucose twice daily 10/28/21   Brita Romp, NP  hydrALAZINE (APRESOLINE) 50 MG tablet TAKE ONE TABLET BY  MOUTH THREE TIMES DAILY 01/27/22   Skeet Latch, MD  metFORMIN (GLUCOPHAGE-XR) 750 MG 24 hr tablet Take 2 tablets (1,500 mg total) by mouth daily with breakfast. 05/11/22   Claretta Fraise, MD  methocarbamol (ROBAXIN) 500 MG tablet Take 1 tablet (500 mg total) by mouth every 8 (eight) hours as needed for muscle spasms. 08/18/21   [name], [name]  oxyCODONE-acetaminophen (PERCOCET) 10-325 MG tablet Take 1 tablet by mouth 3 (three) times daily as needed for pain.    [provider]  rivaroxaban (XARELTO) 20 MG TABS tablet TAKE ONE TABLET BY MOUTH DAILY WITH SUPPER 02/14/22   Claretta Fraise, MD  sitaGLIPtin (JANUVIA) 100 MG tablet Take 1 tablet (100 mg total) by mouth daily. 10/28/21   Brita Romp, NP  testosterone cypionate (DEPOTESTOSTERONE CYPIONATE) 200 MG/ML injection Inject 200 mg into the muscle every 14 (fourteen) days. 07/30/21   [provider]  valsartan (DIOVAN) 160 MG tablet TAKE ONE TABLET BY MOUTH ONCE DAILY 06/06/22   Satira Sark, MD      Allergies    Actos [pioglitazone], Lisinopril, Bystolic [nebivolol hcl], Ibuprofen, Spironolactone, Trulicity [dulaglutide], Amlodipine, Diflucan [fluconazole], Hctz [hydrochlorothiazide], and Losartan    Review of Systems   Review of Systems  Physical Exam Updated Vital Signs BP 92/63 (BP Location: Right Arm)   Pulse 94   Temp 97.6 F (36.4 C) (Oral)   Resp (!) 22   Ht 6' (1.829 m)   Annell Greening Marland Kitchen)  167.8 kg   SpO2 94%   BMI 50.18 kg/m  Physical Exam Vitals and nursing note reviewed.  Constitutional:      General: He is not in acute distress.    Appearance: He is well-developed.  HENT:     Head: Normocephalic and atraumatic.     Mouth/Throat:     Mouth: Mucous membranes are moist.  Eyes:     General: Vision grossly intact. Gaze aligned appropriately.     Extraocular Movements: Extraocular movements intact.     Conjunctiva/sclera: Conjunctivae normal.  Cardiovascular:     Rate and Rhythm: Normal rate  and regular rhythm.     Pulses: Normal pulses.     Heart sounds: Normal heart sounds, S1 normal and S2 normal. No murmur heard.    No friction rub. No gallop.  Pulmonary:     Effort: Pulmonary effort is normal. No respiratory distress.     Breath sounds: Normal breath sounds.  Abdominal:     Palpations: Abdomen is soft.     Tenderness: There is no abdominal tenderness. There is no guarding or rebound.     Hernia: No hernia is present.  Musculoskeletal:        General: No swelling.     Cervical back: Full passive range of motion without pain, normal range of motion and neck supple. No pain with movement, spinous process tenderness or muscular tenderness. Normal range of motion.     Right lower leg: No edema.     Left lower leg: No edema.  Skin:    General: Skin is warm and dry.     Capillary Refill: Capillary refill takes less than 2 seconds.     Findings: No ecchymosis, erythema, lesion or wound.  Neurological:     Mental Status: He is alert and oriented to person, place, and time.     GCS: GCS eye subscore is 4. GCS verbal subscore is 5. GCS motor subscore is 6.     Cranial Nerves: Cranial nerves 2-12 are intact.     Sensory: Sensation is intact.     Motor: Motor function is intact. No weakness or abnormal muscle tone.     Coordination: Coordination is intact.  Psychiatric:        Mood and Affect: Mood normal.        Speech: Speech normal.        Behavior: Behavior normal.     ED Results / Procedures / Treatments   Labs (all labs ordered are listed, but only abnormal results are displayed) Labs Reviewed  LIPASE, BLOOD  COMPREHENSIVE METABOLIC PANEL  CBC  URINALYSIS, ROUTINE W REFLEX MICROSCOPIC    EKG None  Radiology No results found.  Procedures Procedures  {Document cardiac monitor, telemetry assessment procedure when appropriate:1}  Medications Ordered in ED Medications  HYDROmorphone (DILAUDID) injection 1 mg (has no administration in time range)   ondansetron (ZOFRAN) injection 4 mg (has no administration in time range)  sodium chloride 0.9 % bolus 500 mL (has no administration in time range)    ED Course/ Medical Decision Making/ A&P   {   Click here for ABCD2, HEART and other calculatorsREFRESH Note before signing :1}                          Medical Decision Making Amount and/or Complexity of Data Reviewed Labs: ordered.  Risk Prescription drug management.   ***  {Document critical care time when appropriate:1} {Document review of labs  and clinical decision tools ie heart score, Chads2Vasc2 etc:1}  {Document your independent review of radiology images, and any outside records:1} {Document your discussion with family members, caretakers, and with consultants:1} {Document social determinants of health affecting pt's care:1} {Document your decision making why or why not admission, treatments were needed:1} Final Clinical Impression(s) / ED Diagnoses Final diagnoses:  None    Rx / DC Orders ED Discharge Orders     None

## 2022-06-19 NOTE — H&P (Addendum)
History and Physical    Patient: Bradley Torres [id number] DOB: 12/27/1960 DOA: 06/18/2022 DOS: the patient was seen and examined on 06/19/2022 PCP: Claretta Fraise, MD  Patient coming from: Home  Chief Complaint:  Chief Complaint  Patient presents with   Abdominal Pain   HPI: Bradley Torres is a 62 y.o. male with medical history significant of morbid obesity, NSTEMI, CKD 3, hypertension who presents to the emergency department due to abdominal pain which started this morning around 11 AM.  Pain was sharp and rated as 10/10 on pain scale, it was associated with nausea and aggravated with movement.  He denies fever, chills, chest pain, shortness of breath, nausea, vomiting, diarrhea.  ED Course:  In the emergency department, respiratory rate was 22/min, BP was 92/63 on vital signs were within normal range.  Workup in the ED showed normal CBC except for leukocytosis (WBC 20.5).  BMP showed sodium 133, potassium 3.8, chloride 96, bicarb 24, blood glucose 212, BUN/creatinine 22/1.62 (baseline creatinine at 1.4-1.5), lactic acid 4.0 > 1.4, urinalysis was positive for glycosuria, lipase 28.  Blood culture pending. CT abdomen and pelvis with contrast showed changes  consistent with sigmoid diverticulitis with associated perforation and scattered free air throughout the abdomen. Patient was empirically started on IV cefepime and metronidazole, IV hydration per sepsis protocol was provided, Dilaudid was given.  General surgery (Dr. Arnoldo Morale) was consulted and recommended admitting patient with plan to consult on patient in the morning.  Review of Systems: Review of systems as noted in the HPI. All other systems reviewed and are negative.   Past Medical History:  Diagnosis Date   CAD (coronary artery disease)    a. 12/2017: cath showing 10% Proximal-LAD stenosis with no significant obstructive disease and LVEDP mildly elevated at 18 mm Hg.    Chronic back pain    Diabetes mellitus, type 2 (HCC)     DVT (deep venous thrombosis) (HCC)    Essential hypertension    MI (myocardial infarction) ([place]) 2019   Morbid obesity ([place])    Panic disorder    Shoulder pain, left    Following fall   Sleep apnea    Stroke ([place]) 01/2018   Vitamin D deficiency    Past Surgical History:  Procedure Laterality Date   Ankle fracture sugery Left    BIOPSY  07/20/2020   Procedure: BIOPSY;  Surgeon: Daneil Dolin, MD;  [facility];  Service: Endoscopy;;  cecal polyp   COLONOSCOPY WITH PROPOFOL N/A 05/29/2017   Dr. [name]: Multiple tubular adenomas removed, prep inadequate.  Short interval surveillance colonoscopy recommended in 6 months   COLONOSCOPY WITH PROPOFOL N/A 07/20/2020   Procedure: COLONOSCOPY WITH PROPOFOL;  Surgeon: Daneil Dolin, MD;  [facility];  Service: Endoscopy;  Laterality: N/A;  am appt, early as possible since diabetic   LEFT HEART CATH AND CORONARY ANGIOGRAPHY N/A 01/23/2018   Procedure: LEFT HEART CATH AND CORONARY ANGIOGRAPHY;  Surgeon: Troy Sine, MD;  Location: [facility];  Service: Cardiovascular;  Laterality: N/A;   LUMBAR SPINE SURGERY     fusion   POLYPECTOMY  05/29/2017   Procedure: POLYPECTOMY;  Surgeon: Daneil Dolin, MD;  [facility];  Service: Endoscopy;;  ascending colon polyp x5-cs transverse colon polyp x4- cs descending colon polyp x1- hs sigmoid colon polyp x1 -hs rectal polyp x1- hs   TONSILLECTOMY      Social History:  reports that he has never smoked. He has  never used smokeless tobacco. He reports current alcohol use. He reports that he does not currently use drugs after having used the following drugs: Marijuana.   Allergies  Allergen Reactions   Actos [Pioglitazone] Cough    Some dyspnea & edema ( possibly some CHF?)   Lisinopril Other (See Comments)    wheezing   Bystolic [Nebivolol Hcl] Swelling   Ibuprofen Other (See Comments)    Rectal bleed   Spironolactone Hives   Trulicity [Dulaglutide]      Sob, dry cough, GI   Amlodipine Itching and Other (See Comments)    Leg pain.   Diflucan [Fluconazole] Rash   Hctz [Hydrochlorothiazide] Other (See Comments)    Caused gout flares.   Losartan Rash    Family History  Problem Relation Age of Onset   Colon cancer Mother        died at age 59   Heart attack Father    Hepatitis Sister 55       Autoimmune   Heart attack Paternal Grandfather      Prior to Admission medications   Medication Sig Start Date End Date Taking? Authorizing Provider  Accu-Chek Softclix Lancets lancets Use as instructed to monitor glucose twice daily 09/23/21   Brita Romp, NP  allopurinol (ZYLOPRIM) 100 MG tablet Take 1 tablet (100 mg total) by mouth daily. 02/14/22   Claretta Fraise, MD  atorvastatin (LIPITOR) 20 MG tablet Take 1 tablet (20 mg total) by mouth daily. 08/05/21   Skeet Latch, MD  chlorthalidone (HYGROTON) 25 MG tablet Take 1 tablet (25 mg total) by mouth every other day. 10/25/21   Arnoldo Lenis, MD  Cholecalciferol (VITAMIN D3) 250 MCG (10000 UT) capsule Take 10,000 Units by mouth daily.    [provider]  dapagliflozin propanediol (FARXIGA) 10 MG TABS tablet TAKE ONE TABLET BY MOUTH DAILY BEFORE BREAKFAST 10/28/21   Brita Romp, NP  DHEA 25 MG CAPS Take by mouth daily.    [provider]  doxycycline (VIBRA-TABS) 100 MG tablet Take 100 mg by mouth daily. 05/07/22   [provider]  gabapentin (NEURONTIN) 300 MG capsule Take 1 capsule by mouth daily as needed (pain). 04/22/20   [provider]  glucose blood (ACCU-CHEK GUIDE) test strip Use as instructed to monitor glucose twice daily 10/28/21   Brita Romp, NP  hydrALAZINE (APRESOLINE) 50 MG tablet TAKE ONE TABLET BY MOUTH THREE TIMES DAILY 01/27/22   Skeet Latch, MD  metFORMIN (GLUCOPHAGE-XR) 750 MG 24 hr tablet Take 2 tablets (1,500 mg total) by mouth daily with breakfast. 05/11/22   Claretta Fraise, MD  methocarbamol (ROBAXIN) 500 MG  tablet Take 1 tablet (500 mg total) by mouth every 8 (eight) hours as needed for muscle spasms. 08/18/21   [name], [name]  oxyCODONE-acetaminophen (PERCOCET) 10-325 MG tablet Take 1 tablet by mouth 3 (three) times daily as needed for pain.    [provider]  rivaroxaban (XARELTO) 20 MG TABS tablet TAKE ONE TABLET BY MOUTH DAILY WITH SUPPER 02/14/22   Claretta Fraise, MD  sitaGLIPtin (JANUVIA) 100 MG tablet Take 1 tablet (100 mg total) by mouth daily. 10/28/21   Brita Romp, NP  testosterone cypionate (DEPOTESTOSTERONE CYPIONATE) 200 MG/ML injection Inject 200 mg into the muscle every 14 (fourteen) days. 07/30/21   [provider]  valsartan (DIOVAN) 160 MG tablet TAKE ONE TABLET BY MOUTH ONCE DAILY 06/06/22   Satira Sark, MD    Physical Exam: BP 120/74  Pulse 89   Temp 97.7 F (36.5 C) (Axillary)   Resp 19   Ht 6' (1.829 m)   Wt (!) 167.8 kg   SpO2 97%   BMI 50.18 kg/m   General: 62 y.o. year-old male well developed well nourished in no acute distress.  Alert and oriented x3. HEENT: NCAT, EOMI Neck: Supple, trachea medial Cardiovascular: Regular rate and rhythm with no rubs or gallops.  No thyromegaly or JVD noted.  No lower extremity edema. 2/4 pulses in all 4 extremities. Respiratory: Clear to auscultation with no wheezes or rales. Good inspiratory effort. Abdomen: Soft, tender to palpation of LLQ without guarding. Normal bowel sounds x4 quadrants. Muskuloskeletal: No cyanosis, clubbing or edema noted bilaterally Neuro: CN II-XII intact, strength 5/5 x 4, sensation, reflexes intact Skin: No ulcerative lesions noted or rashes Psychiatry: Judgement and insight appear normal. Mood is appropriate for condition and setting          Labs on Admission:  Basic Metabolic Panel: Recent Labs  Lab 06/19/22 0009  NA 133*  K 3.8  CL 96*  CO2 24  GLUCOSE 212*  BUN 22*  CREATININE 1.82*  CALCIUM 8.9   Liver Function Tests: Recent Labs  Lab  06/19/22 0009  AST 23  ALT 22  ALKPHOS 56  BILITOT 1.2  PROT 7.1  ALBUMIN 3.4*   Recent Labs  Lab 06/19/22 0009  LIPASE 28   No results for input(s): "AMMONIA" in the last 168 hours. CBC: Recent Labs  Lab 06/19/22 0009  WBC 20.5*  HGB 16.5  HCT 50.0  MCV 96.0  PLT 275   Cardiac Enzymes: No results for input(s): "CKTOTAL", "CKMB", "CKMBINDEX", "TROPONINI" in the last 168 hours.  BNP (last 3 results) No results for input(s): "BNP" in the last 8760 hours.  ProBNP (last 3 results) No results for input(s): "PROBNP" in the last 8760 hours.  CBG: No results for input(s): "GLUCAP" in the last 168 hours.  Radiological Exams on Admission: CT ABDOMEN PELVIS W CONTRAST  Result Date: 06/19/2022 CLINICAL DATA:  Left lower quadrant pain for several hours, initial encounter EXAM: CT ABDOMEN AND PELVIS WITH CONTRAST TECHNIQUE: Multidetector CT imaging of the abdomen and pelvis was performed using the standard protocol following bolus administration of intravenous contrast. RADIATION DOSE REDUCTION: This exam was performed according to the departmental dose-optimization program which includes automated exposure control, adjustment of the mA and/or kV according to patient size and/or use of iterative reconstruction technique. CONTRAST:  104mL OMNIPAQUE IOHEXOL 300 MG/ML  SOLN COMPARISON:  01/19/2018 FINDINGS: Lower chest: No acute abnormality. Hepatobiliary: Mild fatty infiltration of the liver is noted. The gallbladder is well distended. Dependent gallstones are seen without complicating factors. Pancreas: Unremarkable. No pancreatic ductal dilatation or surrounding inflammatory changes. Spleen: Normal in size without focal abnormality. Adrenals/Urinary Tract: Adrenal glands are within normal limits bilaterally. Kidneys demonstrate no renal calculi or obstructive changes. Stable upper pole renal cyst on the right is noted. No follow-up is recommended. No obstructive changes are seen. The bladder  is partially distended. Stomach/Bowel: There are changes consistent with diverticulitis within the sigmoid colon. Few small pericolonic areas of free air are noted consistent with acute perforation. Multiple locules of free air are noted throughout the abdomen. No obstructive changes are seen. The appendix is within normal limits. Small bowel and stomach are unremarkable. Vascular/Lymphatic: Aortic atherosclerosis. No enlarged abdominal or pelvic lymph nodes. Reproductive: Prostate is unremarkable. Other: No abdominal wall hernia or abnormality. No abdominopelvic ascites. Musculoskeletal: Postsurgical changes are noted  in the lower lumbar spine. IMPRESSION: Changes consistent with sigmoid diverticulitis with associated perforation and scattered free air throughout the abdomen. Cholelithiasis without complicating factors. Fatty liver. Critical Value/emergent results were called by telephone at the time of interpretation on 06/19/2022 at 1:21 am to Dr. Joseph Berkshire , who verbally acknowledged these results. Electronically Signed   By: Inez Catalina M.D.   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)  Principal Problem:   Severe sepsis (HCC) Active Problems:   Acute kidney injury superimposed on chronic kidney disease (HCC)   Morbid (severe) obesity due to excess calories (HCC)   Chronic saddle pulmonary embolism without acute cor  pulmonale (HCC)   Chronic diastolic CHF (congestive heart failure) (HCC)   Severe obstructive sleep apnea-hypopnea syndrome   Type 2 diabetes mellitus with hyperglycemia, without long-term current use of insulin (HCC)   Diverticulitis of sigmoid colon   Prolonged QT interval   Mixed hyperlipidemia   Acute pulmonary embolism (HCC)   History of DVT (deep vein thrombosis)  Severe sepsis secondary to perforated sigmoid diverticulitis Patient met sepsis criteria due to being tachypneic and WBC> 12 (met SIRS criteria), source of infection was abdomen (met sepsis criteria), lactic acid was elevated at 4.0 Patient was started on IV cefepime and metronidazole, we shall continue IV Zosyn Patient will be placed n.p.o. in anticipation for possible surgical procedure Continue IV Dilaudid as needed for moderate/severe pain General surgery (Dr. Arnoldo Morale) was already consulted by EDP and will follow-up with patient in the morning.  Lactic acidosis-resolved Lactic acid 4.0 > 1.4  Type 2 diabetes mellitus with uncontrolled hyperglycemia Continue ISS and hypoglycemia protocol  Prolonged QT interval QTc 510 ms Avoid QT prolonging drugs Magnesium level will be checked Repeat EKG in the morning  Morbid obesity (BMI 50.18) Diet and lifestyle modification Patient may need to follow-up with PCP for weight loss program  Acute kidney injury on CKD 3B BUN/creatinine 22/1.62 (baseline creatinine at 1.4-1.5) Continue IV hydration Renally adjust medications, avoid nephrotoxic agents/dehydration/hypotension  History of saddle pulmonary embolism with right lower extremity DVT Home Xarelto will be temporarily held at this time in lieu of possible surgical intervention  Essential hypertension BP meds will be held at this time due to soft BP  Mixed hyperlipidemia Continue Lipitor  Chronic diastolic heart failure This is chronic and compensated  OSA on CPAP Continue CPAP  DVT prophylaxis: SCDs  Code  Status: Full code   Consults: General surgery (Dr. Arnoldo Morale by EDP)  Family Communication: None at bedside  Severity of Illness: The appropriate patient status for this patient is INPATIENT. Inpatient status is judged to be reasonable and necessary in order to provide the required intensity of service to ensure the patient's safety. The patient's presenting symptoms, physical exam findings, and initial radiographic and laboratory data in the context of their chronic comorbidities is felt to place them at high risk for further clinical deterioration. Furthermore, it is not anticipated that the  patient will be medically stable for discharge from the hospital within 2 midnights of admission.   * I certify that at the point of admission it is my clinical judgment that the patient will require inpatient hospital care spanning beyond 2 midnights from the point of admission due to high intensity of service, high risk for further deterioration and high frequency of surveillance required.*  Author: Bernadette Hoit, DO 06/19/2022 6:34 AM  For on call review [email or web address].

## 2022-06-19 NOTE — ED Notes (Signed)
Critical lab notification- Lactic 2.1, provider notified.

## 2022-06-19 NOTE — TOC Progression Note (Signed)
            Transition of Care [facility]) Screening Note   Patient Details  Name: Bradley Torres Date of Birth: [birth year]   [facility] of [facility]) CM/SW Contact:    Boneta Lucks, RN Phone Number: 06/19/2022, 2:28 PM  Patient in ED need Surgery consult, anticipated days TOC following.   Transition of Care Department [facility]) has reviewed patient and no TOC needs have been identified at this time. We will continue to monitor patient advancement through interdisciplinary progression rounds. If new patient transition needs arise, please place a TOC consult.

## 2022-06-20 DIAGNOSIS — A419 Sepsis, unspecified organism: Secondary | ICD-10-CM | POA: Diagnosis not present

## 2022-06-20 DIAGNOSIS — K5720 Diverticulitis of large intestine with perforation and abscess without bleeding: Secondary | ICD-10-CM | POA: Diagnosis not present

## 2022-06-20 DIAGNOSIS — R6520 Severe sepsis without septic shock: Secondary | ICD-10-CM | POA: Diagnosis not present

## 2022-06-20 LAB — GLUCOSE, CAPILLARY
Glucose-Capillary: 130 mg/dL — ABNORMAL HIGH (ref 70–99)
Glucose-Capillary: 134 mg/dL — ABNORMAL HIGH (ref 70–99)
Glucose-Capillary: 140 mg/dL — ABNORMAL HIGH (ref 70–99)
Glucose-Capillary: 142 mg/dL — ABNORMAL HIGH (ref 70–99)
Glucose-Capillary: 148 mg/dL — ABNORMAL HIGH (ref 70–99)
Glucose-Capillary: 149 mg/dL — ABNORMAL HIGH (ref 70–99)
Glucose-Capillary: 156 mg/dL — ABNORMAL HIGH (ref 70–99)
Glucose-Capillary: 162 mg/dL — ABNORMAL HIGH (ref 70–99)
Glucose-Capillary: 191 mg/dL — ABNORMAL HIGH (ref 70–99)

## 2022-06-20 LAB — COMPREHENSIVE METABOLIC PANEL
ALT: 15 U/L (ref 0–44)
AST: 13 U/L — ABNORMAL LOW (ref 15–41)
Albumin: 2.9 g/dL — ABNORMAL LOW (ref 3.5–5.0)
Alkaline Phosphatase: 50 U/L (ref 38–126)
Anion gap: 10 (ref 5–15)
BUN: 19 mg/dL (ref 6–20)
CO2: 27 mmol/L (ref 22–32)
Calcium: 8.3 mg/dL — ABNORMAL LOW (ref 8.9–10.3)
Chloride: 97 mmol/L — ABNORMAL LOW (ref 98–111)
Creatinine, Ser: 1.47 mg/dL — ABNORMAL HIGH (ref 0.61–1.24)
GFR, Estimated: 54 mL/min — ABNORMAL LOW (ref >60)
Glucose, Bld: 120 mg/dL — ABNORMAL HIGH (ref 70–99)
Potassium: 3.7 mmol/L (ref 3.5–5.1)
Sodium: 134 mmol/L — ABNORMAL LOW (ref 135–145)
Total Bilirubin: 1.2 mg/dL (ref 0.3–1.2)
Total Protein: 6.3 g/dL — ABNORMAL LOW (ref 6.5–8.1)

## 2022-06-20 LAB — CBC
HCT: 45.1 % (ref 39.0–52.0)
Hemoglobin: 14.4 g/dL (ref 13.0–17.0)
MCH: 31.1 pg (ref 26.0–34.0)
MCHC: 31.9 g/dL (ref 30.0–36.0)
MCV: 97.4 fL (ref 80.0–100.0)
Platelets: 221 10*3/uL (ref 150–400)
RBC: 4.63 MIL/uL (ref 4.22–5.81)
RDW: 14.3 % (ref 11.5–15.5)
WBC: 15.8 10*3/uL — ABNORMAL HIGH (ref 4.0–10.5)
nRBC: 0 % (ref 0.0–0.2)

## 2022-06-20 LAB — MAGNESIUM: Magnesium: 1.9 mg/dL (ref 1.7–2.4)

## 2022-06-20 MED ORDER — AMIODARONE HCL IN DEXTROSE 360-4.14 MG/200ML-% IV SOLN
30.0000 mg/h | INTRAVENOUS | Status: DC
  Administered 2022-06-21: 60 mg/h via INTRAVENOUS
  Administered 2022-06-21: 30 mg/h via INTRAVENOUS
  Administered 2022-06-21 – 2022-06-23 (×6): 60 mg/h via INTRAVENOUS
  Administered 2022-06-23 – 2022-06-24 (×2): 30 mg/h via INTRAVENOUS
  Filled 2022-06-20 (×10): qty 200

## 2022-06-20 MED ORDER — CHLORHEXIDINE GLUCONATE CLOTH 2 % EX PADS
6.0000 | MEDICATED_PAD | Freq: Every day | CUTANEOUS | Status: DC
  Administered 2022-06-21 – 2022-06-24 (×4): 6 via TOPICAL

## 2022-06-20 MED ORDER — SODIUM CHLORIDE 0.9 % IV SOLN: INTRAVENOUS | Status: DC

## 2022-06-20 MED ORDER — METOPROLOL TARTRATE 25 MG PO TABS
12.5000 mg | ORAL_TABLET | Freq: Two times a day (BID) | ORAL | Status: DC
  Administered 2022-06-20: 12.5 mg via ORAL
  Filled 2022-06-20: qty 1

## 2022-06-20 MED ORDER — IRBESARTAN 150 MG PO TABS
75.0000 mg | ORAL_TABLET | Freq: Every day | ORAL | Status: DC
  Administered 2022-06-20: 75 mg via ORAL
  Filled 2022-06-20: qty 1

## 2022-06-20 MED ORDER — MAGNESIUM SULFATE 2 GM/50ML IV SOLN
2.0000 g | Freq: Once | INTRAVENOUS | Status: AC
  Administered 2022-06-20: 2 g via INTRAVENOUS
  Filled 2022-06-20: qty 50

## 2022-06-20 MED ORDER — METOPROLOL TARTRATE 5 MG/5ML IV SOLN
10.0000 mg | Freq: Once | INTRAVENOUS | Status: AC
  Administered 2022-06-20: 10 mg via INTRAVENOUS

## 2022-06-20 MED ORDER — AMIODARONE HCL IN DEXTROSE 360-4.14 MG/200ML-% IV SOLN
INTRAVENOUS | Status: AC
  Filled 2022-06-20: qty 200

## 2022-06-20 MED ORDER — METOPROLOL TARTRATE 5 MG/5ML IV SOLN
INTRAVENOUS | Status: AC
  Filled 2022-06-20: qty 10

## 2022-06-20 MED ORDER — AMIODARONE HCL IN DEXTROSE 360-4.14 MG/200ML-% IV SOLN: 30.0000 mg/h | INTRAVENOUS | Status: DC

## 2022-06-20 MED ORDER — AMIODARONE HCL IN DEXTROSE 360-4.14 MG/200ML-% IV SOLN
60.0000 mg/h | INTRAVENOUS | Status: AC
  Administered 2022-06-20 – 2022-06-21 (×2): 60 mg/h via INTRAVENOUS
  Filled 2022-06-20: qty 200

## 2022-06-20 MED ORDER — ENOXAPARIN SODIUM 300 MG/3ML IJ SOLN
165.0000 mg | Freq: Two times a day (BID) | INTRAMUSCULAR | Status: DC
  Administered 2022-06-20 – 2022-06-23 (×6): 165 mg via SUBCUTANEOUS
  Filled 2022-06-20 (×9): qty 1.65

## 2022-06-20 NOTE — Progress Notes (Signed)
Refused CPAP

## 2022-06-20 NOTE — Progress Notes (Signed)
EKG obtained, metoprolol given. MD at bedside. Pt heart rate fluctuating between 130's and 140's. MD gave orders to allow medication time to work and monitor. Pt repositioned and placed on right side for comfort and call light within reach.

## 2022-06-20 NOTE — Progress Notes (Signed)
ANTICOAGULATION CONSULT NOTE - Initial Consult  Pharmacy Consult for Lovenox Indication: hx PE and DVT  Allergies  Allergen Reactions   Actos [Pioglitazone] Cough    Some dyspnea & edema ( possibly some CHF?)   Lisinopril Other (See Comments)    wheezing   Bystolic [Nebivolol Hcl] Swelling   Ibuprofen Other (See Comments)    Rectal bleed   Spironolactone Hives   Trulicity [Dulaglutide]     Sob, dry cough, GI   Amlodipine Itching and Other (See Comments)    Leg pain.   Diflucan [Fluconazole] Rash   Hctz [Hydrochlorothiazide] Other (See Comments)    Caused gout flares.   Losartan Rash    Patient Measurements: Height: 6' (182.9 cm) Weight: (!) 167.8 kg (370 lb) IBW/kg (Calculated) : 77.6 Heparin Dosing Weight:   Vital Signs: Temp: 98.3 F (36.8 C) (03/25 0609) Temp Source: Oral (03/25 0609) BP: 133/71 (03/25 0609) Pulse Rate: 104 (03/25 0609)  Labs: Recent Labs    06/19/22 0009 06/20/22 0409  HGB 16.5 14.4  HCT 50.0 45.1  PLT 275 221  CREATININE 1.82* 1.47*    Estimated Creatinine Clearance: 85.9 mL/min (A) (by C-G formula based on SCr of 1.47 mg/dL (H)).   Medical History: Past Medical History:  Diagnosis Date   CAD (coronary artery disease)    a. 12/2017: cath showing 10% Proximal-LAD stenosis with no significant obstructive disease and LVEDP mildly elevated at 18 mm Hg.    Chronic back pain    Diabetes mellitus, type 2 (HCC)    DVT (deep venous thrombosis) (HCC)    Essential hypertension    MI (myocardial infarction) ([place]) 2019   Morbid obesity ([place])    Panic disorder    Shoulder pain, left    Following fall   Sleep apnea    Stroke ([place]) 01/2018   Vitamin D deficiency     Medications:  Medications Prior to Admission  Medication Sig Dispense Refill Last Dose   allopurinol (ZYLOPRIM) 100 MG tablet Take 1 tablet (100 mg total) by mouth daily. 90 tablet 3 Past Week   atorvastatin (LIPITOR) 20 MG tablet Take 1 tablet (20 mg total) by mouth daily.  90 tablet 3 Past Week   chlorthalidone (HYGROTON) 25 MG tablet Take 1 tablet (25 mg total) by mouth every other day. 45 tablet 3 Past Week   Cholecalciferol (VITAMIN D3) 250 MCG (10000 UT) capsule Take 10,000 Units by mouth daily.   Past Week   dapagliflozin propanediol (FARXIGA) 10 MG TABS tablet TAKE ONE TABLET BY MOUTH DAILY BEFORE BREAKFAST 90 tablet 3 Past Week   DHEA 25 MG CAPS Take by mouth daily.   Past Week   doxycycline (VIBRA-TABS) 100 MG tablet Take 100 mg by mouth daily.   Past Week   gabapentin (NEURONTIN) 300 MG capsule Take 1 capsule by mouth daily as needed (pain).   unknown   hydrALAZINE (APRESOLINE) 50 MG tablet TAKE ONE TABLET BY MOUTH THREE TIMES DAILY (Patient taking differently: Take 50 mg by mouth daily.) 90 tablet 1 Past Week   metFORMIN (GLUCOPHAGE-XR) 750 MG 24 hr tablet Take 2 tablets (1,500 mg total) by mouth daily with breakfast. 180 tablet 1 Past Week   oxyCODONE-acetaminophen (PERCOCET) 10-325 MG tablet Take 1 tablet by mouth 3 (three) times daily as needed for pain.   Past Week   rivaroxaban (XARELTO) 20 MG TABS tablet TAKE ONE TABLET BY MOUTH DAILY WITH SUPPER (Patient taking differently: Take 20 mg by mouth daily.) 90 tablet 3 Past  Week at AM   sitaGLIPtin (JANUVIA) 100 MG tablet Take 1 tablet (100 mg total) by mouth daily. 90 tablet 3 Past Week   testosterone cypionate (DEPOTESTOSTERONE CYPIONATE) 200 MG/ML injection Inject 200 mg into the muscle every 14 (fourteen) days.   unknown   valsartan (DIOVAN) 160 MG tablet TAKE ONE TABLET BY MOUTH ONCE DAILY 90 tablet 1 Past Week   Accu-Chek Softclix Lancets lancets Use as instructed to monitor glucose twice daily 100 each 12    glucose blood (ACCU-CHEK GUIDE) test strip Use as instructed to monitor glucose twice daily 100 each 12     Assessment: Pharmacy consulted to dose Lovenox in patient with history of pulmonary embolism and DVT.  Patient is on Xarelto prior to admission and currently holding due to possible  surgical intervention.  Goal of Therapy:   Monitor platelets by anticoagulation protocol: Yes   Plan:  Lovenox 165 mg subq every 12 hours. Monitor Anti-Xa levels as needed  Margot Ables, PharmD Clinical Pharmacist 06/20/2022 8:52 AM

## 2022-06-20 NOTE — Progress Notes (Addendum)
PROGRESS NOTE    Bradley Torres  [id number] DOB: 02/10/1961 DOA: 06/18/2022 PCP: Claretta Fraise, MD    Brief Narrative:  Bradley Torres is a 62 y.o. male with medical history significant of morbid obesity, NSTEMI, CKD 3, hypertension who presents to the emergency department due to abdominal pain which started this morning around 11 AM.  Pain was sharp and rated as 10/10 on pain scale, it was associated with nausea and aggravated with movement.  He denies fever, chills, chest pain, shortness of breath, nausea, vomiting, diarrhea.    Assessment and Plan: Severe sepsis secondary to perforated sigmoid diverticulitis Patient met sepsis criteria due to being tachypneic and WBC> 12 (met SIRS criteria), source of infection was abdomen (met sepsis criteria), lactic acid was elevated at 4.0 - continue IV Zosyn -sips of clears -Continue IV Dilaudid as needed for moderate/severe pain -General surgery (Dr. Arnoldo Morale) following -conservative care as high risk for surgery, repeat CT scan in several days   Lactic acidosis -resolved   Type 2 diabetes mellitus with uncontrolled hyperglycemia -SSI   Prolonged QT interval -replace Mg   Morbid obesity  Estimated body mass index is 50.18 kg/m as calculated from the following:   Height as of this encounter: 6' (1.829 m).   Weight as of this encounter: 167.8 kg.    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 A fib Home Xarelto will be temporarily held at this time  -lovenox for now until taking in PO -add BB   Essential hypertension BP meds will be held at this time due to soft BP -resume BP meds as needed   Mixed hyperlipidemia -Lipitor   Chronic diastolic heart failure -chronic and compensated -monitor while on IVF   OSA  on CPAP -refuses to wear CPAP   DVT prophylaxis: SCDs Start: 06/19/22 0600    Code Status: Full Code Family Communication:   Disposition Plan:  Level of care: Telemetry Status is: Inpatient Remains inpatient appropriate because: needs IV abx    Consultants:  General surgery   Subjective: Still in pain but asking for more than ice  Objective: Vitals:   06/20/22 0005 06/20/22 0147 06/20/22 0403 06/20/22 0609  BP: 136/73 128/86 (!) 149/71 133/71  Pulse: 99 98 (!) 102 (!) 104  Resp: (!) 21  (!) 22 (!) 21  Temp: 98 F (36.7 C)  98 F (36.7 C) 98.3 F (36.8 C)  TempSrc: Oral  Oral Oral  SpO2: 95%  93% 93%  Weight:      Height:        Intake/Output Summary (Last 24 hours) at 06/20/2022 0911 Last data filed at 06/20/2022 0417 Gross per 24 hour  Intake 1193.49 ml  Output 850 ml  Net 343.49 ml   Filed Weights   06/18/22 2051  Weight: (!) 167.8 kg    Examination:   General: Appearance:    Severely obese male in no acute distress   +BS, tender to palpation in LLQ  Lungs:     respirations unlabored  Heart:    Tachycardic.    MS:   All extremities are intact.    Neurologic:   Awake, alert       Data Reviewed: I have personally reviewed following labs and imaging studies  CBC: Recent Labs  Lab 06/19/22 0009 06/20/22 0409  WBC 20.5*  15.8*  HGB 16.5 14.4  HCT 50.0 45.1  MCV 96.0 97.4  PLT 275 [id number]   Basic Metabolic Panel: Recent Labs  Lab 06/19/22 0009 06/19/22 0630 06/20/22 0409 06/20/22 0818  NA 133*  --  134*  --   K 3.8  --  3.7  --   CL 96*  --  97*  --   CO2 24  --  27  --   GLUCOSE 212*  --  120*  --   BUN 22*  --  19  --   CREATININE 1.82*  --  1.47*  --   CALCIUM 8.9  --  8.3*  --   MG  --  1.6*  --  1.9  PHOS  --  3.4  --   --    GFR: Estimated Creatinine Clearance: 85.9 mL/min (A) (by C-G formula based on SCr of 1.47 mg/dL (H)). Liver Function Tests: Recent Labs  Lab 06/19/22 0009 06/20/22 0409  AST 23 13*  ALT 22 15   ALKPHOS 56 50  BILITOT 1.2 1.2  PROT 7.1 6.3*  ALBUMIN 3.4* 2.9*   Recent Labs  Lab 06/19/22 0009  LIPASE 28   No results for input(s): "AMMONIA" in the last 168 hours. Coagulation Profile: No results for input(s): "INR", "PROTIME" in the last 168 hours. Cardiac Enzymes: No results for input(s): "CKTOTAL", "CKMB", "CKMBINDEX", "TROPONINI" in the last 168 hours. BNP (last 3 results) No results for input(s): "PROBNP" in the last 8760 hours. HbA1C: No results for input(s): "HGBA1C" in the last 72 hours. CBG: Recent Labs  Lab 06/19/22 2010 06/19/22 2312 06/20/22 0004 06/20/22 0402 06/20/22 0802  GLUCAP 155* 127* 162* 149* 134*   Lipid Profile: No results for input(s): "CHOL", "HDL", "LDLCALC", "TRIG", "CHOLHDL", "LDLDIRECT" in the last 72 hours. Thyroid Function Tests: No results for input(s): "TSH", "T4TOTAL", "FREET4", "T3FREE", "THYROIDAB" in the last 72 hours. Anemia Panel: No results for input(s): "VITAMINB12", "FOLATE", "FERRITIN", "TIBC", "IRON", "RETICCTPCT" in the last 72 hours. Sepsis Labs: Recent Labs  Lab 06/19/22 0153 06/19/22 0447 06/19/22 0630  LATICACIDVEN 4.0* 1.7 2.1*    Recent Results (from the past 240 hour(s))  Culture, blood (single)     Status: None (Preliminary result)   Collection Time: 06/19/22  1:53 AM   Specimen: BLOOD LEFT ARM  Result Value Ref Range Status   Specimen Description BLOOD LEFT ARM  Final   Special Requests   Final    BOTTLES DRAWN AEROBIC AND ANAEROBIC Blood Culture adequate volume   Culture   Final    NO GROWTH < 24 HOURS Performed at [facility], [address], [place], [place] [address]    Report Status PENDING  Incomplete         Radiology Studies: CT ABDOMEN PELVIS W CONTRAST  Result Date: 06/19/2022 CLINICAL DATA:  Left lower quadrant pain for several hours, initial encounter EXAM: CT ABDOMEN AND PELVIS WITH CONTRAST TECHNIQUE: Multidetector CT imaging of the abdomen and pelvis was performed using the  standard protocol following bolus administration of intravenous contrast. RADIATION DOSE REDUCTION: This exam was performed according to the departmental dose-optimization program which includes automated exposure control, adjustment of the mA and/or kV according to patient size and/or use of iterative reconstruction technique. CONTRAST:  33mL OMNIPAQUE IOHEXOL 300 MG/ML  SOLN COMPARISON:  01/19/2018 FINDINGS: Lower chest: No acute abnormality. Hepatobiliary: Mild fatty infiltration of the liver is noted. The gallbladder is well distended. Dependent gallstones are seen without complicating factors. Pancreas: Unremarkable. No pancreatic ductal  dilatation or surrounding inflammatory changes. Spleen: Normal in size without focal abnormality. Adrenals/Urinary Tract: Adrenal glands are within normal limits bilaterally. Kidneys demonstrate no renal calculi or obstructive changes. Stable upper pole renal cyst on the right is noted. No follow-up is recommended. No obstructive changes are seen. The bladder is partially distended. Stomach/Bowel: There are changes consistent with diverticulitis within the sigmoid colon. Few small pericolonic areas of free air are noted consistent with acute perforation. Multiple locules of free air are noted throughout the abdomen. No obstructive changes are seen. The appendix is within normal limits. Small bowel and stomach are unremarkable. Vascular/Lymphatic: Aortic atherosclerosis. No enlarged abdominal or pelvic lymph nodes. Reproductive: Prostate is unremarkable. Other: No abdominal wall hernia or abnormality. No abdominopelvic ascites. Musculoskeletal: Postsurgical changes are noted in the lower lumbar spine. IMPRESSION: Changes consistent with sigmoid diverticulitis with associated perforation and scattered free air throughout the abdomen. Cholelithiasis without complicating factors. Fatty liver. Critical Value/emergent results were called by telephone at the time of interpretation on  06/19/2022 at 1:21 am to Dr. Joseph Berkshire , who verbally acknowledged these results. Electronically Signed   By: Inez Catalina M.D.   On: 06/19/2022 01:23        Scheduled Meds:  atorvastatin  20 mg Oral Daily   enoxaparin (LOVENOX) injection  165 mg Subcutaneous Q12H   insulin aspart  0-9 Units Subcutaneous Q4H   Continuous Infusions:  sodium chloride 50 mL/hr at 06/20/22 [id number]   magnesium sulfate bolus IVPB     piperacillin-tazobactam (ZOSYN)  IV 3.375 g (06/20/22 0614)     LOS: 1 day    Time spent: 45 minutes spent on chart review, discussion with nursing staff, consultants, updating family and interview/physical exam; more than 50% of that time was spent in counseling and/or coordination of care.    Geradine Girt, DO [facility] Available via Epic secure chat 7am-7pm After these hours, please refer to coverage provider listed on [email or web address] 06/20/2022, 9:11 AM

## 2022-06-20 NOTE — Progress Notes (Signed)
   06/20/22 1907  Assess: MEWS Score  BP (!) 125/93  MAP (mmHg) 104  Pulse Rate (!) 145  Assess: MEWS Score  MEWS Temp 0  MEWS Systolic 0  MEWS Pulse 3  MEWS RR 2  MEWS LOC 0  MEWS Score 5  MEWS Score Color Red  Assess: if the MEWS score is Yellow or Red  Were vital signs taken at a resting state? Yes  Focused Assessment Change from prior assessment (see assessment flowsheet)  Does the patient meet 2 or more of the SIRS criteria? No  MEWS guidelines implemented  Yes, red  Treat  MEWS Interventions Considered administering scheduled or prn medications/treatments as ordered  Take Vital Signs  Increase Vital Sign Frequency  Red: Q1hr x2, continue Q4hrs until patient remains green for 12hrs  Escalate  MEWS: Escalate Red: Discuss with charge nurse and notify provider. Consider notifying RRT. If remains red for 2 hours consider need for higher level of care  Notify: Charge Nurse/RN  Name of Charge Nurse/RN Notified rachel tate, RN  Provider Notification  Provider Name/Title dr Eliseo Squires  Date Provider Notified 06/20/22  Time Provider Notified 1916  Method of Notification  (secure chat/page per rachel tate, RN)  Notification Reason Other (Comment) (elevated HR sustaining 140s-150s,)  Provider response See new orders  Date of Provider Response 06/20/22  Time of Provider Response 1916  Assess: SIRS CRITERIA  SIRS Temperature  0  SIRS Pulse 1  SIRS Respirations  1  SIRS WBC 0  SIRS Score Sum  2

## 2022-06-20 NOTE — Progress Notes (Signed)
Telemetry called, patient had 6 beat run of Vtach. MD Josephine Cables made aware.

## 2022-06-20 NOTE — Progress Notes (Signed)
Patient continues to be a red mews. Notified Dr. Josephine Cables of updated VS. Patient received 10 mg of IV metoprolol approximately 1 hour before vital signs re obtained. See new orders.     06/20/22 2000  Vitals  Temp 99.2 F (37.3 C)  Temp Source Oral  BP 100/79  BP Location Right Wrist  BP Method Automatic  Patient Position (if appropriate) Lying  Pulse Rate (!) 152  Pulse Rate Source Dinamap  Resp (!) 24  Level of Consciousness  Level of Consciousness Alert  MEWS COLOR  MEWS Score Color Red  Oxygen Therapy  SpO2 93 %  O2 Device Nasal Cannula  O2 Flow Rate (L/min) 2 L/min  PCA/Epidural/Spinal Assessment  Respiratory Pattern Regular;Dyspnea at rest  MEWS Score  MEWS Temp 0  MEWS Systolic 1  MEWS Pulse 3  MEWS RR 1  MEWS LOC 0  MEWS Score 5

## 2022-06-20 NOTE — Progress Notes (Signed)
Subjective: Patient still has left-sided abdominal pain.  Denies any right-sided abdominal pain.  Is taking ice chips without difficulty.  Objective: Vital signs in last 24 hours: Temp:  [97.5 F (36.4 C)-98.3 F (36.8 C)] 98.3 F (36.8 C) (03/25 0609) Pulse Rate:  [77-104] 104 (03/25 0609) Resp:  [17-22] 21 (03/25 0609) BP: (112-149)/(71-86) 133/71 (03/25 0609) SpO2:  [93 %-100 %] 93 % (03/25 0609) Last BM Date : 05/20/22  Intake/Output from previous day: 03/24 0701 - 03/25 0700 In: 1193.5 [I.V.:1079; IV Piggyback:114.5] Out: 850 [Urine:850] Intake/Output this shift: No intake/output data recorded.  General appearance: alert, cooperative, and no distress GI: Soft with tenderness noted along the left side of the abdomen especially in the left lower quadrant.  No right-sided tenderness noted.  No diffuse rigidity noted.  Occasional bowel sounds appreciated.  Lab Results:  Recent Labs    06/19/22 0009 06/20/22 0409  WBC 20.5* 15.8*  HGB 16.5 14.4  HCT 50.0 45.1  PLT 275 221   BMET Recent Labs    06/19/22 0009 06/20/22 0409  NA 133* 134*  K 3.8 3.7  CL 96* 97*  CO2 24 27  GLUCOSE 212* 120*  BUN 22* 19  CREATININE 1.82* 1.47*  CALCIUM 8.9 8.3*   PT/INR No results for input(s): "LABPROT", "INR" in the last 72 hours.  Studies/Results: CT ABDOMEN PELVIS W CONTRAST  Result Date: 06/19/2022 CLINICAL DATA:  Left lower quadrant pain for several hours, initial encounter EXAM: CT ABDOMEN AND PELVIS WITH CONTRAST TECHNIQUE: Multidetector CT imaging of the abdomen and pelvis was performed using the standard protocol following bolus administration of intravenous contrast. RADIATION DOSE REDUCTION: This exam was performed according to the departmental dose-optimization program which includes automated exposure control, adjustment of the mA and/or kV according to patient size and/or use of iterative reconstruction technique. CONTRAST:  32mL OMNIPAQUE IOHEXOL 300 MG/ML  SOLN  COMPARISON:  01/19/2018 FINDINGS: Lower chest: No acute abnormality. Hepatobiliary: Mild fatty infiltration of the liver is noted. The gallbladder is well distended. Dependent gallstones are seen without complicating factors. Pancreas: Unremarkable. No pancreatic ductal dilatation or surrounding inflammatory changes. Spleen: Normal in size without focal abnormality. Adrenals/Urinary Tract: Adrenal glands are within normal limits bilaterally. Kidneys demonstrate no renal calculi or obstructive changes. Stable upper pole renal cyst on the right is noted. No follow-up is recommended. No obstructive changes are seen. The bladder is partially distended. Stomach/Bowel: There are changes consistent with diverticulitis within the sigmoid colon. Few small pericolonic areas of free air are noted consistent with acute perforation. Multiple locules of free air are noted throughout the abdomen. No obstructive changes are seen. The appendix is within normal limits. Small bowel and stomach are unremarkable. Vascular/Lymphatic: Aortic atherosclerosis. No enlarged abdominal or pelvic lymph nodes. Reproductive: Prostate is unremarkable. Other: No abdominal wall hernia or abnormality. No abdominopelvic ascites. Musculoskeletal: Postsurgical changes are noted in the lower lumbar spine. IMPRESSION: Changes consistent with sigmoid diverticulitis with associated perforation and scattered free air throughout the abdomen. Cholelithiasis without complicating factors. Fatty liver. Critical Value/emergent results were called by telephone at the time of interpretation on 06/19/2022 at 1:21 am to Dr. Joseph Berkshire , who verbally acknowledged these results. Electronically Signed   By: Inez Catalina M.D.   On: 06/19/2022 01:23    Anti-infectives: Anti-infectives (From admission, onward)    Start     Dose/Rate Route Frequency Ordered Stop   06/19/22 0800  piperacillin-tazobactam (ZOSYN) IVPB 3.375 g        3.375 g 12.5  mL/hr over 240  Minutes Intravenous Every 8 hours 06/19/22 0624     06/19/22 0145  ceFEPIme (MAXIPIME) 2 g in sodium chloride 0.9 % 100 mL IVPB        2 g 200 mL/hr over 30 Minutes Intravenous  Once 06/19/22 0140 06/19/22 0226   06/19/22 0145  metroNIDAZOLE (FLAGYL) IVPB 500 mg        500 mg 100 mL/hr over 60 Minutes Intravenous  Once 06/19/22 0140 06/19/22 0255       Assessment/Plan: Impression: Sigmoid diverticulitis with perforation.  Leukocytosis is resolving.  His abdominal exam is similar to yesterday and has not worsened. Plan: Continue current treatment plan.  Will let patient have clear liquid diet.  Anticipate follow-up CT scan on Wednesday.  No need for acute surgical invention at the present time.  LOS: 1 day    Aviva Signs 06/20/2022

## 2022-06-20 NOTE — Progress Notes (Signed)
Pt repositioned in bed. C/o pain in left abdominal area. Pain medication given and pt is eating lunch tray.

## 2022-06-20 NOTE — Progress Notes (Signed)
Per tele pt currently in AFIB rhythm, heart rate in 120's. MD made aware, new orders for metoprolol placed.

## 2022-06-20 NOTE — Progress Notes (Signed)
Telemetry called, patient had 3 beat run of Vtach and HR got up to 122. MD Josephine Cables made aware.

## 2022-06-21 ENCOUNTER — Inpatient Hospital Stay (HOSPITAL_COMMUNITY): Payer: Medicaid Other

## 2022-06-21 DIAGNOSIS — R6520 Severe sepsis without septic shock: Secondary | ICD-10-CM | POA: Diagnosis not present

## 2022-06-21 DIAGNOSIS — A419 Sepsis, unspecified organism: Secondary | ICD-10-CM | POA: Diagnosis not present

## 2022-06-21 DIAGNOSIS — I4891 Unspecified atrial fibrillation: Secondary | ICD-10-CM | POA: Diagnosis not present

## 2022-06-21 DIAGNOSIS — K5720 Diverticulitis of large intestine with perforation and abscess without bleeding: Secondary | ICD-10-CM | POA: Diagnosis not present

## 2022-06-21 DIAGNOSIS — G4733 Obstructive sleep apnea (adult) (pediatric): Secondary | ICD-10-CM | POA: Diagnosis not present

## 2022-06-21 DIAGNOSIS — I2510 Atherosclerotic heart disease of native coronary artery without angina pectoris: Secondary | ICD-10-CM

## 2022-06-21 LAB — CBC
HCT: 43.8 % (ref 39.0–52.0)
Hemoglobin: 14 g/dL (ref 13.0–17.0)
MCH: 31 pg (ref 26.0–34.0)
MCHC: 32 g/dL (ref 30.0–36.0)
MCV: 97.1 fL (ref 80.0–100.0)
Platelets: 202 10*3/uL (ref 150–400)
RBC: 4.51 MIL/uL (ref 4.22–5.81)
RDW: 13.9 % (ref 11.5–15.5)
WBC: 14.2 10*3/uL — ABNORMAL HIGH (ref 4.0–10.5)
nRBC: 0 % (ref 0.0–0.2)

## 2022-06-21 LAB — ECHOCARDIOGRAM COMPLETE
Area-P 1/2: 4.52 cm2
Height: 72 in
S' Lateral: 3.5 cm
Weight: 5920 oz

## 2022-06-21 LAB — GLUCOSE, CAPILLARY
Glucose-Capillary: 136 mg/dL — ABNORMAL HIGH (ref 70–99)
Glucose-Capillary: 153 mg/dL — ABNORMAL HIGH (ref 70–99)
Glucose-Capillary: 153 mg/dL — ABNORMAL HIGH (ref 70–99)
Glucose-Capillary: 160 mg/dL — ABNORMAL HIGH (ref 70–99)
Glucose-Capillary: 179 mg/dL — ABNORMAL HIGH (ref 70–99)
Glucose-Capillary: 187 mg/dL — ABNORMAL HIGH (ref 70–99)

## 2022-06-21 LAB — TSH: TSH: 6.514 u[IU]/mL — ABNORMAL HIGH (ref 0.350–4.500)

## 2022-06-21 LAB — BASIC METABOLIC PANEL
Anion gap: 10 (ref 5–15)
BUN: 19 mg/dL (ref 6–20)
CO2: 24 mmol/L (ref 22–32)
Calcium: 7.9 mg/dL — ABNORMAL LOW (ref 8.9–10.3)
Chloride: 98 mmol/L (ref 98–111)
Creatinine, Ser: 1.54 mg/dL — ABNORMAL HIGH (ref 0.61–1.24)
GFR, Estimated: 51 mL/min — ABNORMAL LOW (ref >60)
Glucose, Bld: 141 mg/dL — ABNORMAL HIGH (ref 70–99)
Potassium: 3.6 mmol/L (ref 3.5–5.1)
Sodium: 132 mmol/L — ABNORMAL LOW (ref 135–145)

## 2022-06-21 LAB — HEMOGLOBIN A1C
Hgb A1c MFr Bld: 8.3 % — ABNORMAL HIGH (ref 4.8–5.6)
Mean Plasma Glucose: 192 mg/dL

## 2022-06-21 LAB — MRSA NEXT GEN BY PCR, NASAL: MRSA by PCR Next Gen: NOT DETECTED

## 2022-06-21 MED ORDER — AMIODARONE LOAD VIA INFUSION
150.0000 mg | Freq: Once | INTRAVENOUS | Status: AC
  Administered 2022-06-21: 150 mg via INTRAVENOUS
  Filled 2022-06-21: qty 83.34

## 2022-06-21 MED ORDER — PERFLUTREN LIPID MICROSPHERE
1.0000 mL | INTRAVENOUS | Status: AC | PRN
  Administered 2022-06-21: 4 mL via INTRAVENOUS

## 2022-06-21 MED ORDER — HYDROMORPHONE HCL 1 MG/ML IJ SOLN
0.5000 mg | INTRAMUSCULAR | Status: DC | PRN
  Administered 2022-06-21 (×2): 1 mg via INTRAVENOUS
  Administered 2022-06-21 (×2): 0.5 mg via INTRAVENOUS
  Administered 2022-06-21 – 2022-06-22 (×4): 1 mg via INTRAVENOUS
  Administered 2022-06-22: 0.5 mg via INTRAVENOUS
  Administered 2022-06-22 – 2022-06-27 (×26): 1 mg via INTRAVENOUS
  Administered 2022-06-27 – 2022-06-28 (×7): 0.5 mg via INTRAVENOUS
  Filled 2022-06-21 (×9): qty 1
  Filled 2022-06-21 (×2): qty 0.5
  Filled 2022-06-21 (×5): qty 1
  Filled 2022-06-21: qty 0.5
  Filled 2022-06-21 (×11): qty 1
  Filled 2022-06-21: qty 0.5
  Filled 2022-06-21: qty 1
  Filled 2022-06-21: qty 0.5
  Filled 2022-06-21 (×4): qty 1
  Filled 2022-06-21: qty 0.5
  Filled 2022-06-21: qty 1
  Filled 2022-06-21: qty 0.5
  Filled 2022-06-21 (×5): qty 1

## 2022-06-21 MED ORDER — AMIODARONE IV BOLUS ONLY 150 MG/100ML
150.0000 mg | Freq: Once | INTRAVENOUS | Status: DC
  Filled 2022-06-21: qty 100

## 2022-06-21 NOTE — Progress Notes (Signed)
  Subjective: Patient feels slightly better than yesterday with less abdominal pain.  Did have an episode of flatus.  Objective: Vital signs in last 24 hours: Temp:  [98.2 F (36.8 C)-99.4 F (37.4 C)] 98.7 F (37.1 C) (03/26 0745) Pulse Rate:  [53-163] 101 (03/26 0330) Resp:  [16-27] 19 (03/26 0330) BP: (82-161)/(41-141) 107/41 (03/26 0330) SpO2:  [91 %-98 %] 94 % (03/26 0330) Last BM Date : 06/18/22  Intake/Output from previous day: 03/25 0701 - 03/26 0700 In: -  Out: 800 [Urine:800] Intake/Output this shift: Total I/O In: 1087.7 [I.V.:809.3; IV Piggyback:278.4] Out: -   General appearance: alert, cooperative, and no distress GI: Soft with mild tenderness to palpation along the left flank.  No right-sided abdominal pain.  No rigidity is noted.  Lab Results:  Recent Labs    06/20/22 0409 06/21/22 0458  WBC 15.8* 14.2*  HGB 14.4 14.0  HCT 45.1 43.8  PLT 221 202   BMET Recent Labs    06/20/22 0409 06/21/22 0458  NA 134* 132*  K 3.7 3.6  CL 97* 98  CO2 27 24  GLUCOSE 120* 141*  BUN 19 19  CREATININE 1.47* 1.54*  CALCIUM 8.3* 7.9*   PT/INR No results for input(s): "LABPROT", "INR" in the last 72 hours.  Studies/Results: No results found.  Anti-infectives: Anti-infectives (From admission, onward)    Start     Dose/Rate Route Frequency Ordered Stop   06/19/22 0800  piperacillin-tazobactam (ZOSYN) IVPB 3.375 g        3.375 g 12.5 mL/hr over 240 Minutes Intravenous Every 8 hours 06/19/22 0624     06/19/22 0145  ceFEPIme (MAXIPIME) 2 g in sodium chloride 0.9 % 100 mL IVPB        2 g 200 mL/hr over 30 Minutes Intravenous  Once 06/19/22 0140 06/19/22 0226   06/19/22 0145  metroNIDAZOLE (FLAGYL) IVPB 500 mg        500 mg 100 mL/hr over 60 Minutes Intravenous  Once 06/19/22 0140 06/19/22 0255       Assessment/Plan: Impression: Patient transferred to the ICU yesterday evening for rapid atrial fibrillation, which is still being treated.  His leukocytosis  is improved.  Clinically the patient is improved from the abdominal standpoint. Plan: Would continue IV Zosyn.  Will get CT scan of the abdomen pelvis for follow-up in the next 24 to 48 hours.  May continue clear liquid diet.  LOS: 2 days    Aviva Signs 06/21/2022

## 2022-06-21 NOTE — Progress Notes (Signed)
Came back to assess patient.  HR improved after re-bolus and increase amio to 110s. Eulogio Bear DO

## 2022-06-21 NOTE — Progress Notes (Signed)
Bradley Torres had sustained a HR in the 140's-150's for much of the morning. MD ordered a 150mg  bolus of amio and increased the rate to continue afterwards. Now HR has been more consistently in the 110's-120's. BP has remained stable throughout the day. Pt did not complain of SOB, lightheadedness or dizziness at all. Amio currently going at 60mg /hr.

## 2022-06-21 NOTE — Progress Notes (Signed)
  Echocardiogram 2D Echocardiogram has been performed.  Bradley Torres 06/21/2022, 2:13 PM

## 2022-06-21 NOTE — Consult Note (Addendum)
Cardiology Consultation   Patient ID: Bradley Torres MRN: [id number]; DOB: 1960/04/14  Admit date: 06/18/2022 Date of Consult: 06/21/2022  PCP:  Bradley Torres, [place] Providers Cardiologist:  Carlyle Dolly, MD        Patient Profile:   Bradley Torres is a 62 y.o. male with a hx of CAD (s/p cath in 2019 only showing 10% proximal LAD stenosis), palpitations, HTN, Stage 3 CKD, paroxysmal atrial fibrillation, history of PE who is being seen 06/21/2022 for the evaluation of atrial fibrillation with RVR at the request of Dr. Eliseo Squires.  History of Present Illness:   Mr. Fruin presented to [facility] ED on 06/19/2022 for evaluation of abdominal pain which had started earlier in the day. Was found to be septic with Lactic Acid of 4.0 and CT Abdomen showed sigmoid diverticulitis with associated perforation and scattered free air throughout the abdomen. Was started on IV Zosyn and General Surgery has been following and recommending medical management at this time.    On 06/20/2022, he was noted to go into atrial fibrillation with RVR and HR in the 140's at times. Received PO Lopressor and IV Lopressor with minimal improvement. Was ultimately started on IV Amiodarone yesterday evening around 2200.  Rate still remain elevated in the 120's to 130's.  In talking with the patient today, he is asymptomatic with his atrial fibrillation and says he was overall asymptomatic with prior episodes as well. He is unsure of his atrial fibrillation burden. Reports he has been more short of breath but feels like this is due to his abdominal pain as he is taking shallow breaths.  Denies any specific orthopnea, PND or pitting edema. Reports he was compliant with Xarelto prior to admission with no reports of active bleeding. By review of notes, this was held initially on admission but has been switched to full dose Lovenox.   Past Medical History:  Diagnosis Date   CAD (coronary artery disease)     a. 12/2017: cath showing 10% Proximal-LAD stenosis with no significant obstructive disease and LVEDP mildly elevated at 18 mm Hg.    Chronic back pain    Diabetes mellitus, type 2 (HCC)    DVT (deep venous thrombosis) (HCC)    Essential hypertension    MI (myocardial infarction) ([place]) 2019   Morbid obesity ([place])    Panic disorder    Shoulder pain, left    Following fall   Sleep apnea    Stroke ([place]) 01/2018   Vitamin D deficiency     Past Surgical History:  Procedure Laterality Date   Ankle fracture sugery Left    BIOPSY  07/20/2020   Procedure: BIOPSY;  Surgeon: Daneil Dolin, MD;  Location: [facility];  Service: Endoscopy;;  cecal polyp   COLONOSCOPY WITH PROPOFOL N/A 05/29/2017   Dr. Gala Torres: Multiple tubular adenomas removed, prep inadequate.  Short interval surveillance colonoscopy recommended in 6 months   COLONOSCOPY WITH PROPOFOL N/A 07/20/2020   Procedure: COLONOSCOPY WITH PROPOFOL;  Surgeon: Daneil Dolin, MD;  Location: [facility];  Service: Endoscopy;  Laterality: N/A;  am appt, early as possible since diabetic   LEFT HEART CATH AND CORONARY ANGIOGRAPHY N/A 01/23/2018   Procedure: LEFT HEART CATH AND CORONARY ANGIOGRAPHY;  Surgeon: Bradley Sine, MD;  Location: [place] [facility];  Service: Cardiovascular;  Laterality: N/A;   LUMBAR SPINE SURGERY     fusion   POLYPECTOMY  05/29/2017   Procedure: POLYPECTOMY;  Surgeon:  Rourk, Cristopher Estimable, MD;  Location: [facility];  Service: Endoscopy;;  ascending colon polyp x5-cs transverse colon polyp x4- cs descending colon polyp x1- hs sigmoid colon polyp x1 -hs rectal polyp x1- hs   TONSILLECTOMY       Home Medications:  Prior to Admission medications   Medication Sig Start Date End Date Taking? Authorizing Provider  allopurinol (ZYLOPRIM) 100 MG tablet Take 1 tablet (100 mg total) by mouth daily. 02/14/22  Yes Stacks, Bradley Gash, MD  atorvastatin (LIPITOR) 20 MG tablet Take 1 tablet (20 mg total) by mouth daily. 08/05/21  Yes  Skeet Latch, MD  chlorthalidone (HYGROTON) 25 MG tablet Take 1 tablet (25 mg total) by mouth every other day. 10/25/21  Yes BranchAlphonse Guild, MD  Cholecalciferol (VITAMIN D3) 250 MCG (10000 UT) capsule Take 10,000 Units by mouth daily.   Yes [provider]  dapagliflozin propanediol (FARXIGA) 10 MG TABS tablet TAKE ONE TABLET BY MOUTH DAILY BEFORE BREAKFAST 10/28/21  Yes Bradley Romp, NP  DHEA 25 MG CAPS Take by mouth daily.   Yes [provider]  doxycycline (VIBRA-TABS) 100 MG tablet Take 100 mg by mouth daily. 05/07/22  Yes [provider]  gabapentin (NEURONTIN) 300 MG capsule Take 1 capsule by mouth daily as needed (pain). 04/22/20  Yes [provider]  hydrALAZINE (APRESOLINE) 50 MG tablet TAKE ONE TABLET BY MOUTH THREE TIMES DAILY Patient taking differently: Take 50 mg by mouth daily. 01/27/22  Yes Skeet Latch, MD  metFORMIN (GLUCOPHAGE-XR) 750 MG 24 hr tablet Take 2 tablets (1,500 mg total) by mouth daily with breakfast. 05/11/22  Yes Bradley Fraise, MD  oxyCODONE-acetaminophen (PERCOCET) 10-325 MG tablet Take 1 tablet by mouth 3 (three) times daily as needed for pain.   Yes [provider]  rivaroxaban (XARELTO) 20 MG TABS tablet TAKE ONE TABLET BY MOUTH DAILY WITH SUPPER Patient taking differently: Take 20 mg by mouth daily. 02/14/22  Yes Stacks, Bradley Gash, MD  sitaGLIPtin (JANUVIA) 100 MG tablet Take 1 tablet (100 mg total) by mouth daily. 10/28/21  Yes Bradley Romp, NP  testosterone cypionate (DEPOTESTOSTERONE CYPIONATE) 200 MG/ML injection Inject 200 mg into the muscle every 14 (fourteen) days. 07/30/21  Yes [provider]  valsartan (DIOVAN) 160 MG tablet TAKE ONE TABLET BY MOUTH ONCE DAILY 06/06/22  Yes Bradley Sark, MD  Accu-Chek Softclix Lancets lancets Use as instructed to monitor glucose twice daily 09/23/21   Bradley Romp, NP  glucose blood (ACCU-CHEK GUIDE) test strip Use as instructed to monitor  glucose twice daily 10/28/21   Bradley Romp, NP    Inpatient Medications: Scheduled Meds:  atorvastatin  20 mg Oral Daily   Chlorhexidine Gluconate Cloth  6 each Topical Daily   enoxaparin (LOVENOX) injection  165 mg Subcutaneous Q12H   insulin aspart  0-9 Units Subcutaneous Q4H   Continuous Infusions:  sodium chloride Stopped (06/20/22 2034)   amiodarone 30 mg/hr (06/21/22 0733)   piperacillin-tazobactam (ZOSYN)  IV 12.5 mL/hr at 06/21/22 0733   PRN Meds: acetaminophen **OR** acetaminophen, HYDROmorphone (DILAUDID) injection, prochlorperazine  Allergies:    Allergies  Allergen Reactions   Actos [Pioglitazone] Cough    Some dyspnea & edema ( possibly some CHF?)   Lisinopril Other (See Comments)    wheezing   Bystolic [Nebivolol Hcl] Swelling   Ibuprofen Other (See Comments)    Rectal bleed   Spironolactone Hives   Trulicity [Dulaglutide]     Sob, dry cough, GI   Amlodipine Itching  and Other (See Comments)    Leg pain.   Diflucan [Fluconazole] Rash   Hctz [Hydrochlorothiazide] Other (See Comments)    Caused gout flares.   Losartan Rash    Social History:   Social History   Socioeconomic History   Marital status: Single    Spouse name: Not on file   Number of children: Not on file   Years of education: Not on file   Highest education level: Not on file  Occupational History   Occupation: disabled  Tobacco Use   Smoking status: Never   Smokeless tobacco: Never  Vaping Use   Vaping Use: Never used  Substance and Sexual Activity   Alcohol use: Yes    Comment: occ   Drug use: Not Currently    Types: Marijuana    Comment: daily; 05/19/20 no marijuana in past 6 months d/t going to pain management clinic   Sexual activity: Not Currently    Birth control/protection: None  Other Topics Concern   Not on file  Social History Narrative   Not on file   Social Determinants of Health   Financial Resource Strain: Medium Risk (05/18/2020)   Overall Financial  Resource Strain (CARDIA)    Difficulty of Paying Living Expenses: Somewhat hard  Food Insecurity: No Food Insecurity (01/16/2020)   Hunger Vital Sign    Worried About Running Out of Food in the Last Year: Never true    Ran Out of Food in the Last Year: Never true  Transportation Needs: No Transportation Needs (01/16/2020)   PRAPARE - Hydrologist (Medical): No    Lack of Transportation (Non-Medical): No  Physical Activity: Not on file  Stress: Stress Concern Present (05/15/2019)   [place]    Feeling of Stress : Rather much  Social Connections: Unknown (01/16/2020)   Social Connection and Isolation Panel [NHANES]    Frequency of Communication with Friends and Family: More than three times a week    Frequency of Social Gatherings with Friends and Family: More than three times a week    Attends Religious Services: Not on Advertising copywriter or Organizations: Not on file    Attends Archivist Meetings: Not on file    Marital Status: Not on file  Intimate Partner Violence: Not on file    Family History:    Family History  Problem Relation Age of Onset   Colon cancer Mother        died at age 1   Heart attack Father    Hepatitis Sister 95       Autoimmune   Heart attack Paternal Grandfather      ROS:  Please see the history of present illness.   All other ROS reviewed and negative.     Physical Exam/Data:   Vitals:   06/21/22 0300 06/21/22 0330 06/21/22 0437 06/21/22 0745  BP: (!) 161/141 (!) 107/41    Pulse: (!) 53 (!) 101    Resp: 20 19    Temp:   98.9 F (37.2 C) 98.7 F (37.1 C)  TempSrc:   Oral Oral  SpO2: 96% 94%    Weight:      Height:        Intake/Output Summary (Last 24 hours) at 06/21/2022 1210 Last data filed at 06/21/2022 0900 Gross per 24 hour  Intake 1087.69 ml  Output 1200 ml  Net -112.31 ml  06/18/2022    8:51 PM 05/11/2022     1:24 PM 04/28/2022    1:50 PM  Last 3 Weights  Weight (lbs) 370 lb 374 lb 6.4 oz 375 lb 9.6 oz  Weight (kg) 167.831 kg 169.827 kg 170.371 kg     Body mass index is 50.18 kg/m.  General: Pleasant, obese male appearing in no acute distress. HEENT: normal Neck: no JVD Vascular: No carotid bruits; Distal pulses 2+ bilaterally Cardiac:  normal S1, S2; irregular irregular, tachycardic Lungs:  clear to auscultation bilaterally, no wheezing, rhonchi or rales  Abd: soft, hepatomegaly  Ext: Trace lower extremity edema Musculoskeletal:  No deformities, BUE and BLE strength normal and equal Skin: warm and dry  Neuro:  CNs 2-12 intact, no focal abnormalities noted Psych:  Normal affect   EKG:  The EKG was personally reviewed and demonstrates: 06/19/2022: Sinus tachycardia, heart rate 99 with PAC's. Telemetry:  Telemetry was personally reviewed and demonstrates: Atrial fibrillation, heart rate in 120s to 150s.  Relevant CV Studies:  LHC: 12/2017 Prox LAD lesion is 10% stenosed. The left ventricular ejection fraction is 50-55% by visual estimate. LV end diastolic pressure is mildly elevated. The left ventricular systolic function is normal.   No significant coronary obstructive disease with essentially normal coronary arteries and only mild luminal irregularity of the proximal LAD of 10%; normal left circumflex and normal dominant RCA.   Low normal global LV function with an ejection fraction of 50 to 55% without definitive focal segmental wall motion abnormalities.  LVEDP is 18 mmHg.   RECOMMENDATION: Medical therapy.  Weight loss is essential.  The patient should be evaluated for obstructive sleep apnea.   No indication for antiplatelet therapy at this time.  Echocardiogram: 04/2019 IMPRESSIONS     1. Left ventricular ejection fraction, by estimation, is 60 to 65%. The  left ventricle has normal function. The left ventricle has no regional  wall motion abnormalities. There is  moderate concentric left ventricular  hypertrophy. Left ventricular  diastolic parameters were normal.   2. Right ventricular systolic function is normal. The right ventricular  size is normal.   3. The mitral valve is grossly normal. Trivial mitral valve  regurgitation.   4. The aortic valve is tricuspid. Aortic valve regurgitation is not  visualized. No aortic stenosis is present.   5. The inferior vena cava is normal in size with greater than 50%  respiratory variability, suggesting right atrial pressure of 3 mmHg.   Laboratory Data:  High Sensitivity Troponin:  No results for input(s): "TROPONINIHS" in the last 720 hours.   Chemistry Recent Labs  Lab 06/19/22 0009 06/19/22 0630 06/20/22 0409 06/20/22 0818 06/21/22 0458  NA 133*  --  134*  --  132*  K 3.8  --  3.7  --  3.6  CL 96*  --  97*  --  98  CO2 24  --  27  --  24  GLUCOSE 212*  --  120*  --  141*  BUN 22*  --  19  --  19  CREATININE 1.82*  --  1.47*  --  1.54*  CALCIUM 8.9  --  8.3*  --  7.9*  MG  --  1.6*  --  1.9  --   GFRNONAA 42*  --  54*  --  51*  ANIONGAP 13  --  10  --  10    Recent Labs  Lab 06/19/22 0009 06/20/22 0409  PROT 7.1 6.3*  ALBUMIN 3.4* 2.9*  AST 23 13*  ALT 22 15  ALKPHOS 56 50  BILITOT 1.2 1.2   Hematology Recent Labs  Lab 06/19/22 0009 06/20/22 0409 06/21/22 0458  WBC 20.5* 15.8* 14.2*  RBC 5.21 4.63 4.51  HGB 16.5 14.4 14.0  HCT 50.0 45.1 43.8  MCV 96.0 97.4 97.1  MCH 31.7 31.1 31.0  MCHC 33.0 31.9 32.0  RDW 13.6 14.3 13.9  PLT 275 221 202   Thyroid  Recent Labs  Lab 06/21/22 0458  TSH 6.514*    Radiology/Studies:  CT ABDOMEN PELVIS W CONTRAST  Result Date: 06/19/2022 CLINICAL DATA:  Left lower quadrant pain for several hours, initial encounter EXAM: CT ABDOMEN AND PELVIS WITH CONTRAST TECHNIQUE: Multidetector CT imaging of the abdomen and pelvis was performed using the standard protocol following bolus administration of intravenous contrast. RADIATION DOSE  REDUCTION: This exam was performed according to the departmental dose-optimization program which includes automated exposure control, adjustment of the mA and/or kV according to patient size and/or use of iterative reconstruction technique. CONTRAST:  64mL OMNIPAQUE IOHEXOL 300 MG/ML  SOLN COMPARISON:  01/19/2018 FINDINGS: Lower chest: No acute abnormality. Hepatobiliary: Mild fatty infiltration of the liver is noted. The gallbladder is well distended. Dependent gallstones are seen without complicating factors. Pancreas: Unremarkable. No pancreatic ductal dilatation or surrounding inflammatory changes. Spleen: Normal in size without focal abnormality. Adrenals/Urinary Tract: Adrenal glands are within normal limits bilaterally. Kidneys demonstrate no renal calculi or obstructive changes. Stable upper pole renal cyst on the right is noted. No follow-up is recommended. No obstructive changes are seen. The bladder is partially distended. Stomach/Bowel: There are changes consistent with diverticulitis within the sigmoid colon. Few small pericolonic areas of free air are noted consistent with acute perforation. Multiple locules of free air are noted throughout the abdomen. No obstructive changes are seen. The appendix is within normal limits. Small bowel and stomach are unremarkable. Vascular/Lymphatic: Aortic atherosclerosis. No enlarged abdominal or pelvic lymph nodes. Reproductive: Prostate is unremarkable. Other: No abdominal wall hernia or abnormality. No abdominopelvic ascites. Musculoskeletal: Postsurgical changes are noted in the lower lumbar spine. IMPRESSION: Changes consistent with sigmoid diverticulitis with associated perforation and scattered free air throughout the abdomen. Cholelithiasis without complicating factors. Fatty liver. Critical Value/emergent results were called by telephone at the time of interpretation on 06/19/2022 at 1:21 am to Dr. Joseph Berkshire , who verbally acknowledged these  results. Electronically Signed   By: Inez Catalina M.D.   On: 06/19/2022 01:23     Assessment and Plan:   1. Atrial Fibrillation with RVR - He has a known history of paroxysmal atrial fibrillation but more persistent this admission and likely secondary to his acute illness as he is currently admitted for sepsis in the setting of perforated sigmoid diverticulitis. - Agree with starting IV Amiodarone given his SBP in the 90s to low 100s. Will continue with IV loading today and if he converts to normal sinus rhythm, can switch to PO dosing once able to take PO medications consistently. If he ultimately requires a cardioversion this admission, would need a TEE given that anticoagulation was held on admission but hopefully will convert back to normal sinus rhythm with medical therapy. An echocardiogram has been ordered by the admitting team and would wait to obtain this once rates have improved.  2. CAD - He had minimal nonobstructive disease by catheterization in 2019 as outlined above. If he were to require surgery this admission, he would not require further cardiac testing at this time.  3.  History  of PE/DVT - He was on Xarelto prior to admission which is currently held and receiving full dose Lovenox until able to take PO medications.  4. OSA - He has refused to use CPAP which is likely contributing to #1 as well.   5. Sepsis in the setting of Perforated Diverticulitis - Remains on IV antibiotics and general surgery is following with plans for repeat imaging later this admission.   For questions or updates, please contact [place] Please consult [email or web address] for contact info under    Signed, Bernerd Pho, PA-C 06/21/2022 12:10 PM   Attending note:  Patient seen and examined, I reviewed his records and discussed the case with Ms. Delano Metz, I agree with her above findings.  Mr. Marlana Salvage follows with Dr. Harl Bowie, known history of paroxysmal atrial fibrillation on  Xarelto for stroke prophylaxis with CHA2DS2-VASc score of 5.  He is currently admitted with abdominal pain and diagnosis of sepsis in association with sigmoid diverticulitis with perforation, currently being managed conservatively with surgical team following.  He has had recurrent, rapid atrial fibrillation during hospital stay, presently on IV amiodarone, antihypertensive medications have been held given lower blood pressure at baseline in the current clinical setting.  Xarelto was held at this time in case he needs surgical intervention and he is presently on Lovenox.  On examination he reports mild abdominal discomfort, pain medications have been given.  No chest pain or any sense of palpitations.  He is afebrile, remains in rapid atrial fibrillation by telemetry, heart rates 120s to 140s on average.  Blood pressure 113/87.  Lungs exhibit decreased breath sounds.  Cardiac exam with irregularly irregular rhythm.  Pertinent lab work includes potassium 3.6, BUN 19, creatinine 1.54, WBC 14.2, hemoglobin 14.0, platelets 202, TSH 6.514.  Tracing from 06/18/2022 showed sinus rhythm with lead motion artifact, nonspecific ST-T changes.  LVEF was normal at last assessment and he has a previously documented history of minor coronary atherosclerosis as of 2019.  Persistent atrial fibrillation with RVR in the setting of acute illness.  Agree with IV amiodarone, would rebolus and increase drip back to 60 mg/h.  Cannot add AV nodal blockers at this time given recent blood pressures, but this would be next step as he stabilizes further.  Bradley Torres, M.D., F.A.C.C.

## 2022-06-21 NOTE — Progress Notes (Signed)
PROGRESS NOTE    Bradley Torres  [id number] DOB: September 25, 1960 DOA: 06/18/2022 PCP: Claretta Fraise, MD    Brief Narrative:  Bradley Torres is a 62 y.o. male with medical history significant of morbid obesity, NSTEMI, CKD 3, hypertension who presents to the emergency department due to abdominal pain which started this morning around 11 AM.  Pain was sharp and rated as 10/10 on pain scale, it was associated with nausea and aggravated with movement.  He denies fever, chills, chest pain, shortness of breath, nausea, vomiting, diarrhea.  Overnight on 3/25 went into a fib with RVR-- started on IV amiodarone and continues to have hgh HR    Assessment and Plan: Severe sepsis secondary to perforated sigmoid diverticulitis Patient met sepsis criteria due to being tachypneic and WBC> 12 (met SIRS criteria), source of infection was abdomen (met sepsis criteria), lactic acid was elevated at 4.0 - continue IV Zosyn -sips of clears -Continue IV Dilaudid as needed for moderate/severe pain- adjust dose for better coverage -General surgery (Dr. Arnoldo Morale) following -conservative care as high risk for surgery, repeat CT scan in several days   A fib with RVR -known h/o p. A fib -IV amiodarone -was not on any rate controlling meds at home -cards consult -TSH/echo pending  (Has sleep apnea that he will NOT wear a CPAP for) -xarelto on hold-- using lovenox (did miss about 24 hours of anticoagulation while in the ER when first admitted)  Lactic acidosis -resolved   Type 2 diabetes mellitus with uncontrolled hyperglycemia -SSI   Prolonged QT interval -replace Mg   Morbid obesity  Estimated body mass index is 50.18 kg/m as calculated from the following:   Height as of this encounter: 6' (1.829 m).   Weight as of this encounter: 167.8 kg.    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 A fib Home Xarelto will be temporarily held at this time  -lovenox for now until taking in PO   Essential hypertension BP meds will be held at this time due to soft BP -resume BP meds as needed   Mixed hyperlipidemia -Lipitor   Chronic diastolic heart failure -chronic and compensated -monitor while on IVF   OSA on CPAP -refuses to wear CPAP   DVT prophylaxis: SCDs Start: 06/19/22 0600    Code Status: Full Code  Disposition Plan:  Level of care: Stepdown Status is: Inpatient Remains inpatient appropriate because: needs IV abx and repeat CT scan    Consultants:  General surgery   Subjective: Went into a fib with RVR last PM C/o abdominal pain + flatus  Objective: Vitals:   06/21/22 0230 06/21/22 0300 06/21/22 0330 06/21/22 0437  BP: 106/89 (!) 161/141 (!) 107/41   Pulse: 70 (!) 53 (!) 101   Resp: 16 20 19    Temp:    98.9 F (37.2 C)  TempSrc:    Oral  SpO2: 95% 96% 94%   Weight:      Height:        Intake/Output Summary (Last 24 hours) at 06/21/2022 0735 Last data filed at 06/21/2022 [id number] Gross per 24 hour  Intake 1087.69 ml  Output 800 ml  Net 287.69 ml   Filed Weights   06/18/22 2051  Weight: (!) 167.8 kg    Examination:   General: Appearance:    Severely  obese male in no acute distress   Diminished BS, tender to palpation in LLQ  Lungs:     respirations unlabored  Heart:    Tachycardic. irregular   MS:   All extremities are intact.    Neurologic:   Awake, alert       Data Reviewed: I have personally reviewed following labs and imaging studies  CBC: Recent Labs  Lab 06/19/22 0009 06/20/22 0409 06/21/22 0458  WBC 20.5* 15.8* 14.2*  HGB 16.5 14.4 14.0  HCT 50.0 45.1 43.8  MCV 96.0 97.4 97.1  PLT 275 221 [id number]   Basic Metabolic Panel: Recent Labs  Lab 06/19/22 0009 06/19/22 0630 06/20/22 0409  06/20/22 0818 06/21/22 0458  NA 133*  --  134*  --  132*  K 3.8  --  3.7  --  3.6  CL 96*  --  97*  --  98  CO2 24  --  27  --  24  GLUCOSE 212*  --  120*  --  141*  BUN 22*  --  19  --  19  CREATININE 1.82*  --  1.47*  --  1.54*  CALCIUM 8.9  --  8.3*  --  7.9*  MG  --  1.6*  --  1.9  --   PHOS  --  3.4  --   --   --    GFR: Estimated Creatinine Clearance: 82 mL/min (A) (by C-G formula based on SCr of 1.54 mg/dL (H)). Liver Function Tests: Recent Labs  Lab 06/19/22 0009 06/20/22 0409  AST 23 13*  ALT 22 15  ALKPHOS 56 50  BILITOT 1.2 1.2  PROT 7.1 6.3*  ALBUMIN 3.4* 2.9*   Recent Labs  Lab 06/19/22 0009  LIPASE 28   No results for input(s): "AMMONIA" in the last 168 hours. Coagulation Profile: No results for input(s): "INR", "PROTIME" in the last 168 hours. Cardiac Enzymes: No results for input(s): "CKTOTAL", "CKMB", "CKMBINDEX", "TROPONINI" in the last 168 hours. BNP (last 3 results) No results for input(s): "PROBNP" in the last 8760 hours. HbA1C: Recent Labs    06/19/22 0630  HGBA1C 8.3*   CBG: Recent Labs  Lab 06/20/22 2011 06/20/22 2047 06/20/22 2138 06/20/22 2336 06/21/22 0321  GLUCAP 148* 156* 142* 140* 153*   Lipid Profile: No results for input(s): "CHOL", "HDL", "LDLCALC", "TRIG", "CHOLHDL", "LDLDIRECT" in the last 72 hours. Thyroid Function Tests: No results for input(s): "TSH", "T4TOTAL", "FREET4", "T3FREE", "THYROIDAB" in the last 72 hours. Anemia Panel: No results for input(s): "VITAMINB12", "FOLATE", "FERRITIN", "TIBC", "IRON", "RETICCTPCT" in the last 72 hours. Sepsis Labs: Recent Labs  Lab 06/19/22 0153 06/19/22 0447 06/19/22 0630  LATICACIDVEN 4.0* 1.7 2.1*    Recent Results (from the past 240 hour(s))  Culture, blood (single)     Status: None (Preliminary result)   Collection Time: 06/19/22  1:53 AM   Specimen: BLOOD LEFT ARM  Result Value Ref Range Status   Specimen Description BLOOD LEFT ARM  Final   Special Requests    Final    BOTTLES DRAWN AEROBIC AND ANAEROBIC Blood Culture adequate volume   Culture   Final    NO GROWTH < 24 HOURS Performed at [facility], [address] [place], [place] [address]    Report Status PENDING  Incomplete         Radiology Studies: No results found.      Scheduled Meds:  atorvastatin  20 mg Oral Daily   Chlorhexidine Gluconate Cloth  6 each  Topical Daily   enoxaparin (LOVENOX) injection  165 mg Subcutaneous Q12H   insulin aspart  0-9 Units Subcutaneous Q4H   Continuous Infusions:  sodium chloride Stopped (06/20/22 2034)   amiodarone 30 mg/hr (06/21/22 0733)   piperacillin-tazobactam (ZOSYN)  IV 12.5 mL/hr at 06/21/22 0733     LOS: 2 days    Time spent: 45 minutes spent on chart review, discussion with nursing staff, consultants, updating family and interview/physical exam; more than 50% of that time was spent in counseling and/or coordination of care.    Geradine Girt, DO [facility] Available via Epic secure chat 7am-7pm After these hours, please refer to coverage provider listed on [email or web address] 06/21/2022, 7:35 AM

## 2022-06-22 ENCOUNTER — Inpatient Hospital Stay (HOSPITAL_COMMUNITY): Payer: Medicaid Other

## 2022-06-22 DIAGNOSIS — E876 Hypokalemia: Secondary | ICD-10-CM | POA: Diagnosis not present

## 2022-06-22 DIAGNOSIS — G4733 Obstructive sleep apnea (adult) (pediatric): Secondary | ICD-10-CM | POA: Diagnosis not present

## 2022-06-22 DIAGNOSIS — I480 Paroxysmal atrial fibrillation: Secondary | ICD-10-CM

## 2022-06-22 DIAGNOSIS — I4891 Unspecified atrial fibrillation: Secondary | ICD-10-CM | POA: Diagnosis not present

## 2022-06-22 DIAGNOSIS — K5730 Diverticulosis of large intestine without perforation or abscess without bleeding: Secondary | ICD-10-CM | POA: Diagnosis not present

## 2022-06-22 DIAGNOSIS — K8020 Calculus of gallbladder without cholecystitis without obstruction: Secondary | ICD-10-CM | POA: Diagnosis not present

## 2022-06-22 DIAGNOSIS — K5720 Diverticulitis of large intestine with perforation and abscess without bleeding: Secondary | ICD-10-CM | POA: Diagnosis not present

## 2022-06-22 DIAGNOSIS — I2510 Atherosclerotic heart disease of native coronary artery without angina pectoris: Secondary | ICD-10-CM | POA: Diagnosis not present

## 2022-06-22 LAB — CBC
HCT: 44.1 % (ref 39.0–52.0)
Hemoglobin: 14.3 g/dL (ref 13.0–17.0)
MCH: 31.1 pg (ref 26.0–34.0)
MCHC: 32.4 g/dL (ref 30.0–36.0)
MCV: 95.9 fL (ref 80.0–100.0)
Platelets: 208 10*3/uL (ref 150–400)
RBC: 4.6 MIL/uL (ref 4.22–5.81)
RDW: 14.1 % (ref 11.5–15.5)
WBC: 11.6 10*3/uL — ABNORMAL HIGH (ref 4.0–10.5)
nRBC: 0 % (ref 0.0–0.2)

## 2022-06-22 LAB — BASIC METABOLIC PANEL
Anion gap: 8 (ref 5–15)
BUN: 18 mg/dL (ref 6–20)
CO2: 27 mmol/L (ref 22–32)
Calcium: 7.7 mg/dL — ABNORMAL LOW (ref 8.9–10.3)
Chloride: 97 mmol/L — ABNORMAL LOW (ref 98–111)
Creatinine, Ser: 1.39 mg/dL — ABNORMAL HIGH (ref 0.61–1.24)
GFR, Estimated: 58 mL/min — ABNORMAL LOW (ref >60)
Glucose, Bld: 137 mg/dL — ABNORMAL HIGH (ref 70–99)
Potassium: 3.3 mmol/L — ABNORMAL LOW (ref 3.5–5.1)
Sodium: 132 mmol/L — ABNORMAL LOW (ref 135–145)

## 2022-06-22 LAB — GLUCOSE, CAPILLARY
Glucose-Capillary: 151 mg/dL — ABNORMAL HIGH (ref 70–99)
Glucose-Capillary: 143 mg/dL — ABNORMAL HIGH (ref 70–99)
Glucose-Capillary: 166 mg/dL — ABNORMAL HIGH (ref 70–99)
Glucose-Capillary: 175 mg/dL — ABNORMAL HIGH (ref 70–99)
Glucose-Capillary: 158 mg/dL — ABNORMAL HIGH (ref 70–99)

## 2022-06-22 LAB — MAGNESIUM: Magnesium: 2.1 mg/dL (ref 1.7–2.4)

## 2022-06-22 MED ORDER — METOPROLOL TARTRATE 5 MG/5ML IV SOLN: 2.5000 mg | Freq: Four times a day (QID) | INTRAVENOUS | Status: DC | PRN

## 2022-06-22 MED ORDER — IOHEXOL 300 MG/ML  SOLN
100.0000 mL | Freq: Once | INTRAMUSCULAR | Status: AC | PRN
  Administered 2022-06-22: 100 mL via INTRAVENOUS

## 2022-06-22 MED ORDER — POTASSIUM CHLORIDE CRYS ER 20 MEQ PO TBCR
40.0000 meq | EXTENDED_RELEASE_TABLET | Freq: Once | ORAL | Status: AC
  Administered 2022-06-22: 40 meq via ORAL
  Filled 2022-06-22: qty 2

## 2022-06-22 NOTE — Progress Notes (Signed)
PROGRESS NOTE    Bradley Torres  [id number] DOB: 1960/09/27 DOA: 06/18/2022 PCP: Claretta Fraise, MD    Brief Narrative:  Bradley Torres is a 62 y.o. male with medical history significant of morbid obesity, NSTEMI, CKD 3, hypertension who presents to the emergency department due to abdominal pain which started this morning around 11 AM.  Pain was sharp and rated as 10/10 on pain scale, it was associated with nausea and aggravated with movement.  He denies fever, chills, chest pain, shortness of breath, nausea, vomiting, diarrhea.  Overnight on 3/25 went into a fib with RVR-- started on IV amiodarone and continues to have hgh HR    Assessment and Plan: Severe sepsis secondary to perforated sigmoid diverticulitis Patient met sepsis criteria due to being tachypneic and WBC> 12 (met SIRS criteria), source of infection was abdomen (met sepsis criteria), lactic acid was elevated at 4.0. Patient was seen by general surgery. IV Zosyn being continued.  Pain management. WBC noted to be better. General surgery plans to repeat a CT scan in the next 24 to 48 hours.   A fib with RVR Known history of atrial fibrillation.  Was not on any rate limiting medications at home.  Was on Xarelto which is currently on hold.  On full dose Lovenox. Cardiology is following.  Patient is on IV amiodarone.  Cardiology plans to add as needed metoprolol today. Echocardiogram shows normal systolic function left atrial size was mildly dilated.  No significant valvular disease noted. TSH noted to be 6.5.  Will check free T4 tomorrow.  Lactic acidosis -resolved   Type 2 diabetes mellitus with uncontrolled hyperglycemia Monitor CBGs.  Continue SSI.  Hypokalemia Will be repleted.  Check magnesium level.   Prolonged QT interval Monitor electrolytes and correct as indicated.   Acute kidney injury on CKD 3B (baseline creatinine at 1.4-1.5) Monitor urine output.  Renal function seems to be stable.     History of  saddle pulmonary embolism with right lower extremity DVT Home Xarelto is currently on hold.  On full dose Lovenox.     Essential hypertension Monitor blood pressures closely.   Mixed hyperlipidemia -Lipitor   Chronic diastolic heart failure -chronic and compensated -monitor while on IVF   OSA on CPAP -refuses to wear CPAP  Morbid obesity  Estimated body mass index is 51.52 kg/m as calculated from the following:   Height as of this encounter: 6' (1.829 m).   Weight as of this encounter: 172.3 kg.   DVT prophylaxis: SCDs Start: 06/19/22 0600 CODE STATUS: Full code Family communication: Discussed with patient. Disposition Plan: Hopefully return home when improved  Status is: Inpatient Remains inpatient appropriate because: Perforated diverticulitis, atrial fibrillation with RVR    Consultants:  General surgery Cardiology   Subjective: Continues to have abdominal pain.  7 out of 10 in intensity.  No chest pain or shortness of breath.  No nausea or vomiting.  Objective: Vitals:   06/22/22 0200 06/22/22 0300 06/22/22 0400 06/22/22 0516  BP: 139/79 (!) 130/106 (!) 132/93   Pulse: 77 77 (!) 131   Resp: 20 19 16    Temp:    97.7 F (36.5 C)  TempSrc:    Oral  SpO2: 96% 95% 91%   Weight:    (!) 172.3 kg  Height:        Intake/Output Summary (Last 24 hours) at 06/22/2022 1002 Last data filed at 06/22/2022 0800 Gross per 24 hour  Intake 1019.11 ml  Output 1825 ml  Net -  805.89 ml    Filed Weights   06/18/22 2051 06/22/22 0516  Weight: (!) 167.8 kg (!) 172.3 kg    Examination:  General appearance: Awake alert.  In no distress Resp: Clear to auscultation bilaterally.  Normal effort Cardio: S1-S2 is tachycardic regular. GI: Abdomen is soft.  Tender diffusely but more so on the left side.  No rebound rigidity or guarding.  Bowel sounds sluggish.  No masses organomegaly. Extremities: No edema.  Moving his extremities  Neurologic: Alert and oriented x3.  No focal  neurological deficits.    Data Reviewed: I have personally reviewed following labs and imaging studies  CBC: Recent Labs  Lab 06/19/22 0009 06/20/22 0409 06/21/22 0458 06/22/22 0423  WBC 20.5* 15.8* 14.2* 11.6*  HGB 16.5 14.4 14.0 14.3  HCT 50.0 45.1 43.8 44.1  MCV 96.0 97.4 97.1 95.9  PLT 275 221 202 [id number]    Basic Metabolic Panel: Recent Labs  Lab 06/19/22 0009 06/19/22 0630 06/20/22 0409 06/20/22 0818 06/21/22 0458 06/22/22 0423  NA 133*  --  134*  --  132* 132*  K 3.8  --  3.7  --  3.6 3.3*  CL 96*  --  97*  --  98 97*  CO2 24  --  27  --  24 27  GLUCOSE 212*  --  120*  --  141* 137*  BUN 22*  --  19  --  19 18  CREATININE 1.82*  --  1.47*  --  1.54* 1.39*  CALCIUM 8.9  --  8.3*  --  7.9* 7.7*  MG  --  1.6*  --  1.9  --   --   PHOS  --  3.4  --   --   --   --     GFR: Estimated Creatinine Clearance: 92.3 mL/min (A) (by C-G formula based on SCr of 1.39 mg/dL (H)). Liver Function Tests: Recent Labs  Lab 06/19/22 0009 06/20/22 0409  AST 23 13*  ALT 22 15  ALKPHOS 56 50  BILITOT 1.2 1.2  PROT 7.1 6.3*  ALBUMIN 3.4* 2.9*    Recent Labs  Lab 06/19/22 0009  LIPASE 28     CBG: Recent Labs  Lab 06/21/22 1600 06/21/22 2014 06/21/22 2348 06/22/22 0508 06/22/22 0713  GLUCAP 160* 179* 153* 151* 143*     Thyroid Function Tests: Recent Labs    06/21/22 0458  TSH 6.514*    Sepsis Labs: Recent Labs  Lab 06/19/22 0153 06/19/22 0447 06/19/22 0630  LATICACIDVEN 4.0* 1.7 2.1*     Recent Results (from the past 240 hour(s))  Culture, blood (single)     Status: None (Preliminary result)   Collection Time: 06/19/22  1:53 AM   Specimen: BLOOD LEFT ARM  Result Value Ref Range Status   Specimen Description BLOOD LEFT ARM  Final   Special Requests   Final    BOTTLES DRAWN AEROBIC AND ANAEROBIC Blood Culture adequate volume   Culture   Final    NO GROWTH 2 DAYS Performed at [facility], [address], [place], [place] [address]    Report  Status PENDING  Incomplete  MRSA Next Gen by PCR, Nasal     Status: None   Collection Time: 06/20/22 10:26 PM   Specimen: Nasal Mucosa; Nasal Swab  Result Value Ref Range Status   MRSA by PCR Next Gen NOT DETECTED NOT DETECTED Final    Comment: (NOTE) The GeneXpert MRSA Assay (FDA approved for NASAL specimens only), is one  component of a comprehensive MRSA colonization surveillance program. It is not intended to diagnose MRSA infection nor to guide or monitor treatment for MRSA infections. Test performance is not FDA approved in patients less than 43 years old. Performed at [facility], [address] [place], [place] [address]          Radiology Studies: ECHOCARDIOGRAM COMPLETE  Result Date: 06/21/2022    ECHOCARDIOGRAM REPORT   Patient Name:   EKAM DERSTINE Date of Exam: 06/21/2022 Medical Rec #:  [id number]       Height:       72.0 in Accession #:    [id number]      Weight:       370.0 lb Date of Birth:  04/30/1960        BSA:          2.767 m Patient Age:    11 years        BP:           106/91 mmHg Patient Gender: M               HR:           120 bpm. Exam Location:  [facility] Procedure: 2D Echo, Cardiac Doppler, Color Doppler and Intracardiac            Opacification Agent Indications:    Atrial Fibrillation I48.91  History:        Patient has prior history of Echocardiogram examinations, most                 recent 05/21/2019. CAD and Previous Myocardial Infarction, P.E.                 and Stroke; Risk Factors:Sleep Apnea, Diabetes, Dyslipidemia,                 Non-Smoker and Hypertension.  Sonographer:    Greer Pickerel Referring Phys: [phone] JESSICA U Eliseo Squires  Sonographer Comments: Patient is obese, suboptimal subcostal window and Technically difficult study due to poor echo windows. Image acquisition challenging due to respiratory motion. IMPRESSIONS  1. Left ventricular ejection fraction, by estimation, is 50 to 55% - difficuly evaluation in the setting of rapid atrial fibrillation and  variable contraction from beat to beat. There is intermittent septal bounce in association with respiration. The left ventricle has low normal function. The left ventricle demonstrates global hypokinesis. Left ventricular diastolic parameters are indeterminate.  2. Right ventricular systolic function is moderately reduced. The right ventricular size is mildly enlarged. There is mildly elevated pulmonary artery systolic pressure. The estimated right ventricular systolic pressure is [id number] mmHg.  3. Left atrial size was mildly dilated.  4. The mitral valve is grossly normal. Trivial mitral valve regurgitation.  5. The aortic valve is tricuspid. Aortic valve regurgitation is not visualized.  6. The inferior vena cava is dilated in size with <50% respiratory variability, suggesting right atrial pressure of 15 mmHg. Comparison(s): Prior images reviewed side by side. Difficult to compare since patient currently in rapid atrial fibrillation. Consider follow-up limited study once back in sinus rhythm. FINDINGS  Left Ventricle: Left ventricular ejection fraction, by estimation, is 50 to 55%. The left ventricle has low normal function. The left ventricle demonstrates global hypokinesis. Definity contrast agent was given IV to delineate the left ventricular endocardial borders. The left ventricular internal cavity size was normal in size. There is no left ventricular hypertrophy. Left ventricular diastolic function could not be evaluated due to atrial fibrillation.  Left ventricular diastolic parameters are indeterminate. Right Ventricle: The right ventricular size is mildly enlarged. No increase in right ventricular wall thickness. Right ventricular systolic function is moderately reduced. There is mildly elevated pulmonary artery systolic pressure. The tricuspid regurgitant velocity is 2.61 m/s, and with an assumed right atrial pressure of 15 mmHg, the estimated right ventricular systolic pressure is [id number] mmHg. Left Atrium:  Left atrial size was mildly dilated. Right Atrium: Right atrial size was normal in size. Pericardium: There is no evidence of pericardial effusion. Mitral Valve: The mitral valve is grossly normal. Trivial mitral valve regurgitation. Tricuspid Valve: The tricuspid valve is grossly normal. Tricuspid valve regurgitation is mild. Aortic Valve: The aortic valve is tricuspid. There is mild aortic valve annular calcification. Aortic valve regurgitation is not visualized. Pulmonic Valve: The pulmonic valve was grossly normal. Pulmonic valve regurgitation is trivial. Aorta: The aortic root is normal in size and structure. Venous: The inferior vena cava is dilated in size with less than 50% respiratory variability, suggesting right atrial pressure of 15 mmHg. IAS/Shunts: No atrial level shunt detected by color flow Doppler.  LEFT VENTRICLE PLAX 2D LVIDd:         4.70 cm   Diastology LVIDs:         3.50 cm   LV e' medial:    5.98 cm/s LV PW:         0.90 cm   LV E/e' medial:  11.5 LV IVS:        0.90 cm   LV e' lateral:   12.50 cm/s LVOT diam:     2.10 cm   LV E/e' lateral: 5.5 LV SV:         42 LV SV Index:   15 LVOT Area:     3.46 cm  RIGHT VENTRICLE RV S prime:     11.00 cm/s TAPSE (M-mode): 1.2 cm LEFT ATRIUM              Index        RIGHT ATRIUM           Index LA diam:        4.30 cm  1.55 cm/m   RA Area:     21.90 cm LA Vol (A2C):   116.0 ml 41.92 ml/m  RA Volume:   66.60 ml  24.07 ml/m LA Vol (A4C):   84.7 ml  30.61 ml/m LA Biplane Vol: 101.0 ml 36.50 ml/m  AORTIC VALVE LVOT Vmax:   83.60 cm/s LVOT Vmean:  53.600 cm/s LVOT VTI:    0.120 m  AORTA Ao Root diam: 4.20 cm MITRAL VALVE               TRICUSPID VALVE MV Area (PHT): 4.52 cm    TR Peak grad:   27.2 mmHg MV Decel Time: 168 msec    TR Vmax:        261.00 cm/s MV E velocity: 69.00 cm/s                            SHUNTS                            Systemic VTI:  0.12 m                            Systemic Diam: 2.10 cm Rozann Lesches MD Electronically  signed by Rozann Lesches MD Signature Date/Time: 06/21/2022/2:44:21 PM    Final         Scheduled Meds:  atorvastatin  20 mg Oral Daily   Chlorhexidine Gluconate Cloth  6 each Topical Daily   enoxaparin (LOVENOX) injection  165 mg Subcutaneous Q12H   insulin aspart  0-9 Units Subcutaneous Q4H   Continuous Infusions:  sodium chloride Stopped (06/20/22 2034)   amiodarone 60 mg/hr (06/22/22 0616)   piperacillin-tazobactam (ZOSYN)  IV 3.375 g (06/22/22 0515)     LOS: 3 days     Bonnielee Haff,  [facility]  06/22/2022, 10:02 AM

## 2022-06-22 NOTE — Plan of Care (Signed)
  Problem: Acute Rehab PT Goals(only PT should resolve) Goal: Pt will Roll Supine to Side Flowsheets (Taken 06/22/2022 1608) Pt will Roll Supine to Side: Independently Goal: Patient Will Transfer Sit To/From Stand Flowsheets (Taken 06/22/2022 1608) Patient will transfer sit to/from stand: Independently Goal: Pt Will Ambulate Flowsheets (Taken 06/22/2022 1608) Pt will Ambulate:  > 125 feet  Independently Goal: Pt Will Go Up/Down Stairs Flowsheets (Taken 06/22/2022 1608) Pt will Go Up / Down Stairs:  6-9 stairs  Independently  Wonda Olds PT, DPT Physical Therapist with [facility] [phone] office

## 2022-06-22 NOTE — Evaluation (Signed)
Physical Therapy Evaluation Patient Details Name: Bradley Torres MRN: [id number] DOB: January 04, 1961 Today's Date: 06/22/2022  History of Present Illness  pt is a 62 y.o. male with medical history significant of morbid obesity, NSTEMI, CKD 3, hypertension who presents to the emergency department due to abdominal pain which started this morning around 11 AM.  Pain was sharp and rated as 10/10 on pain scale, it was associated with nausea and aggravated with movement.  He denies fever, chills, chest pain, shortness of breath, nausea, vomiting, diarrhea.  Overnight on 3/25 went into a fib with RVR-- started on IV amiodarone and continues to have hgh HR  Clinical Impression   Pt is a 62yo male presenting to [facility] due to reason stated in the HPI.   Pt tolerated today's Physical Therapy Evaluation, but was limited in progressive mobility due to pain levels.. Pt performed min guard w/o AD w/ functional transfers, and min guard for 2-3 steps from recliner w/o AD for gait/ambulation.   Based upon objective information, pt's functional deficits and impairments included reduced activity tolerance and functional mobility due to increased balance and unsteadiness in setting of high pain levels. . Based upon these deficits/impairments, pt would benefit from skilled acute physical therapy services to address the above deficits and improve their functional status.  PT recommends pt discharge to Home with Home Health PT services in order to improve pt's functional status, safety and independence with functional mobility and overall QOL.            Recommendations for follow up therapy are one component of a multi-disciplinary discharge planning process, led by the attending physician.  Recommendations may be updated based on patient status, additional functional criteria and insurance authorization.  Follow Up Recommendations       Assistance Recommended at Discharge Set up  Supervision/Assistance  Patient can return home with the following  A little help with walking and/or transfers;A little help with bathing/dressing/bathroom;Assistance with cooking/housework    Equipment Recommendations    Recommendations for Other Services       Functional Status Assessment Patient has had a recent decline in their functional status and demonstrates the ability to make significant improvements in function in a reasonable and predictable amount of time.     Precautions / Restrictions Precautions Precautions: None Restrictions Weight Bearing Restrictions: No      Mobility  Bed Mobility               General bed mobility comments: Pt received at recliner. Patient Response: Cooperative  Transfers Overall transfer level: Needs assistance   Transfers: Sit to/from Stand Sit to Stand: Min guard           General transfer comment: min guard from recliner due to increased pain and unsteadiness. slides anteriors with use of BUE from armrest to pushup.    Ambulation/Gait Ambulation/Gait assistance: Min guard Gait Distance (Feet): 2 Feet Assistive device: None, 1 person hand held assist         General Gait Details: 2 steps from chair; no further due to increased pain. shuffling, increased weight lateral weight shift due body habitus and increased pain level.  Stairs            Wheelchair Mobility    Modified Rankin (Stroke Patients Only)       Balance Overall balance assessment: Needs assistance Sitting-balance support: No upper extremity supported Sitting balance-Leahy Scale: Fair Sitting balance - Comments: fair/fair unsteady due to pain   Standing  balance support: No upper extremity supported Standing balance-Leahy Scale: Fair Standing balance comment: fair/fair due to increased pain                             Pertinent Vitals/Pain Pain Assessment Pain Assessment: 0-10 Pain Score: 8  Pain Location: "belly" Pain  Descriptors / Indicators: Aching, Sharp Pain Intervention(s): Limited activity within patient's tolerance, Monitored during session    Home Living Family/patient expects to be discharged to:: Private residence Living Arrangements: Alone Available Help at Discharge: Friend(s) Type of Home: Mobile home Home Access: Stairs to enter Entrance Stairs-Rails:  (did not comment) Entrance Stairs-Number of Steps: 5-6   Home Layout: One level Home Equipment: Conservation officer, nature (2 wheels);Cane - quad;BSC/3in1;Shower seat Additional Comments: "I have everything"    Prior Function Prior Level of Function : Independent/Modified Independent             Mobility Comments: Reports independence with all ambulation and transfers without device ADLs Comments: independent with ADLs, shower chair for bathing     Hand Dominance   Dominant Hand: Right    Extremity/Trunk Assessment                Communication   Communication: No difficulties  Cognition Arousal/Alertness: Awake/alert Behavior During Therapy: WFL for tasks assessed/performed Overall Cognitive Status: Within Functional Limits for tasks assessed                                          General Comments      Exercises     Assessment/Plan    PT Assessment Patient needs continued PT services  PT Problem List Decreased activity tolerance;Decreased balance;Pain       PT Treatment Interventions Gait training;Neuromuscular re-education;Stair training;Functional mobility training;Therapeutic activities;Therapeutic exercise;Balance training    PT Goals (Current goals can be found in the Care Plan section)  Acute Rehab PT Goals Patient Stated Goal: "go home" PT Goal Formulation: With patient Time For Goal Achievement: 07/06/22 Potential to Achieve Goals: Good    Frequency Min 2X/week     Co-evaluation               AM-PAC PT "6 Clicks" Mobility  Outcome Measure Help needed turning from your  back to your side while in a flat bed without using bedrails?: None Help needed moving from lying on your back to sitting on the side of a flat bed without using bedrails?: None Help needed moving to and from a bed to a chair (including a wheelchair)?: None Help needed standing up from a chair using your arms (e.g., wheelchair or bedside chair)?: A Little Help needed to walk in hospital room?: A Little Help needed climbing 3-5 steps with a railing? : A Lot 6 Click Score: 20    End of Session Equipment Utilized During Treatment: Oxygen Activity Tolerance: Patient tolerated treatment well;Patient limited by pain Patient left: in chair;with call bell/phone within reach Nurse Communication: Mobility status PT Visit Diagnosis: Unsteadiness on feet (R26.81);Other abnormalities of gait and mobility (R26.89);Difficulty in walking, not elsewhere classified (R26.2)    Time: [id number] PT Time Calculation (min) (ACUTE ONLY): 20 min   Charges:   PT Evaluation $PT Eval Moderate Complexity: 1 Mod          Wonda Olds PT, DPT Physical Therapist with [facility] [phone]  office  Wonda Olds 06/22/2022, 4:05 PM

## 2022-06-22 NOTE — Progress Notes (Signed)
Follow-up CT scan reveals more significant pneumoperitoneum but no abscess cavity or significant free fluid.  I went back and examined the patient and clinically he is better with no peritoneal signs.  I explained to the patient that there is more air in the abdominal cavity, but the perforation may have sealed over.  The contrast has not reached the sigmoid colon.  As he clinically is better and his leukocytosis has almost resolved, I am inclined not to operate on the patient as he is a high risk surgical candidate.  Patient understands this and agrees.  Discussed with Dr. Pricilla Handler on.  Would continue bowel rest.  He may need TPN should he remain without significant oral nutrition over the next few days.

## 2022-06-22 NOTE — Progress Notes (Addendum)
Progress Note  Patient Name: Bradley Torres Date of Encounter: 06/22/2022  Primary Cardiologist: Carlyle Dolly, MD  Interval Summary   Chart reviewed.  Patient tolerating clear liquids.  No nausea or emesis.  Mild intermittent abdominal discomfort.  Reports no chest pain or palpitations.  Heart rate control is somewhat better following adjustments in IV amiodarone yesterday.  Blood pressure trend is also better.  Vital Signs    Vitals:   06/22/22 0200 06/22/22 0300 06/22/22 0400 06/22/22 0516  BP: 139/79 (!) 130/106 (!) 132/93   Pulse: 77 77 (!) 131   Resp: 20 19 16    Temp:    97.7 F (36.5 C)  TempSrc:    Oral  SpO2: 96% 95% 91%   Weight:    (!) 172.3 kg  Height:        Intake/Output Summary (Last 24 hours) at 06/22/2022 0841 Last data filed at 06/22/2022 0443 Gross per 24 hour  Intake 1019.11 ml  Output 1825 ml  Net -805.89 ml   Filed Weights   06/18/22 2051 06/22/22 0516  Weight: (!) 167.8 kg (!) 172.3 kg    Physical Exam   GEN: Obese, no acute distress.   Neck: No JVD. Cardiac: Irregularly irregular without gallop.  Respiratory: Nonlabored.  Decreased breath sounds. GI: Soft, bowel sounds present. MS: Chronic appearing edema.  ECG/Telemetry    Telemetry reviewed showing persistent atrial fibrillation.  Labs    Chemistry Recent Labs  Lab 06/19/22 0009 06/20/22 0409 06/21/22 0458 06/22/22 0423  NA 133* 134* 132* 132*  K 3.8 3.7 3.6 3.3*  CL 96* 97* 98 97*  CO2 24 27 24 27   GLUCOSE 212* 120* 141* 137*  BUN 22* 19 19 18   CREATININE 1.82* 1.47* 1.54* 1.39*  CALCIUM 8.9 8.3* 7.9* 7.7*  PROT 7.1 6.3*  --   --   ALBUMIN 3.4* 2.9*  --   --   AST 23 13*  --   --   ALT 22 15  --   --   ALKPHOS 56 50  --   --   BILITOT 1.2 1.2  --   --   GFRNONAA 42* 54* 51* 58*  ANIONGAP 13 10 10 8     Hematology Recent Labs  Lab 06/20/22 0409 06/21/22 0458 06/22/22 0423  WBC 15.8* 14.2* 11.6*  RBC 4.63 4.51 4.60  HGB 14.4 14.0 14.3  HCT 45.1 43.8  44.1  MCV 97.4 97.1 95.9  MCH 31.1 31.0 31.1  MCHC 31.9 32.0 32.4  RDW 14.3 13.9 14.1  PLT 221 202 208   Lipid Panel     Component Value Date/Time   CHOL 133 05/11/2022 1436   TRIG 118 05/11/2022 1436   HDL 36 (L) 05/11/2022 1436   CHOLHDL 3.7 05/11/2022 1436   CHOLHDL 5.1 01/20/2018 0209   VLDL 26 01/20/2018 0209   LDLCALC 76 05/11/2022 1436   LABVLDL 21 05/11/2022 1436    Cardiac Studies   Echocardiogram 06/22/2022:  1. Left ventricular ejection fraction, by estimation, is 50 to 55% -  difficuly evaluation in the setting of rapid atrial fibrillation and  variable contraction from beat to beat. There is intermittent septal  bounce in association with respiration. The  left ventricle has low normal function. The left ventricle demonstrates  global hypokinesis. Left ventricular diastolic parameters are  indeterminate.   2. Right ventricular systolic function is moderately reduced. The right  ventricular size is mildly enlarged. There is mildly elevated pulmonary  artery systolic pressure. The estimated  right ventricular systolic  pressure is [id number] mmHg.   3. Left atrial size was mildly dilated.   4. The mitral valve is grossly normal. Trivial mitral valve  regurgitation.   5. The aortic valve is tricuspid. Aortic valve regurgitation is not  visualized.   6. The inferior vena cava is dilated in size with <50% respiratory  variability, suggesting right atrial pressure of 15 mmHg.   Assessment & Plan   1.  Atrial fibrillation with RVR in the setting of acute illness.  Known history of paroxysmal atrial fibrillation with CHA2DS2-VASc score of 5 typically on Xarelto, however temporarily transitioned to Lovenox in the setting of sepsis with sigmoid diverticulitis with perforation in case surgical intervention is required.  He is on IV amiodarone providing better heart rate control, blood pressure trend now allows addition of further AV nodal blockade.  2.  OSA, noncompliant with  CPAP.  3.  History of pulmonary embolus/DVT.  Currently transitioned from Xarelto to Lovenox as discussed above.  4.  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  LVEF 50 to 55% by current echocardiogram in the setting of rapid atrial fibrillation.  Continue IV amiodarone at current dose for now.  Per chart review, patient has multiple medication intolerances including calcium channel blockers and Bystolic (swelling).  Does not necessarily sound like allergic reaction to beta-blockers in general however.  We will try to add divided dose IV Lopressor next as blood pressure tolerates.  He remains on treatment dose Lovenox as well while Xarelto was held.  For questions or updates, please contact [place] Please consult [email or web address] for contact info under   Signed, Rozann Lesches, MD  06/22/2022, 8:41 AM

## 2022-06-23 DIAGNOSIS — I4891 Unspecified atrial fibrillation: Secondary | ICD-10-CM | POA: Diagnosis not present

## 2022-06-23 DIAGNOSIS — I480 Paroxysmal atrial fibrillation: Secondary | ICD-10-CM | POA: Diagnosis not present

## 2022-06-23 DIAGNOSIS — E876 Hypokalemia: Secondary | ICD-10-CM | POA: Diagnosis not present

## 2022-06-23 DIAGNOSIS — K5720 Diverticulitis of large intestine with perforation and abscess without bleeding: Secondary | ICD-10-CM | POA: Diagnosis not present

## 2022-06-23 LAB — CBC
HCT: 44.5 % (ref 39.0–52.0)
Hemoglobin: 14.3 g/dL (ref 13.0–17.0)
MCH: 30.6 pg (ref 26.0–34.0)
MCHC: 32.1 g/dL (ref 30.0–36.0)
MCV: 95.1 fL (ref 80.0–100.0)
Platelets: 227 10*3/uL (ref 150–400)
RBC: 4.68 MIL/uL (ref 4.22–5.81)
RDW: 14.4 % (ref 11.5–15.5)
WBC: 9.7 10*3/uL (ref 4.0–10.5)
nRBC: 0 % (ref 0.0–0.2)

## 2022-06-23 LAB — GLUCOSE, CAPILLARY
Glucose-Capillary: 119 mg/dL — ABNORMAL HIGH (ref 70–99)
Glucose-Capillary: 142 mg/dL — ABNORMAL HIGH (ref 70–99)
Glucose-Capillary: 150 mg/dL — ABNORMAL HIGH (ref 70–99)
Glucose-Capillary: 152 mg/dL — ABNORMAL HIGH (ref 70–99)
Glucose-Capillary: 165 mg/dL — ABNORMAL HIGH (ref 70–99)
Glucose-Capillary: 174 mg/dL — ABNORMAL HIGH (ref 70–99)
Glucose-Capillary: 181 mg/dL — ABNORMAL HIGH (ref 70–99)
Glucose-Capillary: 96 mg/dL (ref 70–99)

## 2022-06-23 LAB — BASIC METABOLIC PANEL
Anion gap: 9 (ref 5–15)
BUN: 20 mg/dL (ref 6–20)
CO2: 27 mmol/L (ref 22–32)
Calcium: 7.8 mg/dL — ABNORMAL LOW (ref 8.9–10.3)
Chloride: 97 mmol/L — ABNORMAL LOW (ref 98–111)
Creatinine, Ser: 1.54 mg/dL — ABNORMAL HIGH (ref 0.61–1.24)
GFR, Estimated: 51 mL/min — ABNORMAL LOW (ref >60)
Glucose, Bld: 130 mg/dL — ABNORMAL HIGH (ref 70–99)
Potassium: 3.5 mmol/L (ref 3.5–5.1)
Sodium: 133 mmol/L — ABNORMAL LOW (ref 135–145)

## 2022-06-23 LAB — HEPARIN ANTI-XA: Heparin LMW: 1.01 IU/mL

## 2022-06-23 LAB — MAGNESIUM: Magnesium: 2.2 mg/dL (ref 1.7–2.4)

## 2022-06-23 LAB — T4, FREE: Free T4: 1.01 ng/dL (ref 0.61–1.12)

## 2022-06-23 MED ORDER — POTASSIUM CHLORIDE CRYS ER 20 MEQ PO TBCR
40.0000 meq | EXTENDED_RELEASE_TABLET | Freq: Once | ORAL | Status: AC
  Administered 2022-06-23: 40 meq via ORAL
  Filled 2022-06-23: qty 2

## 2022-06-23 MED ORDER — ENOXAPARIN SODIUM 150 MG/ML IJ SOSY
150.0000 mg | PREFILLED_SYRINGE | Freq: Two times a day (BID) | INTRAMUSCULAR | Status: DC
  Administered 2022-06-23 – 2022-06-28 (×8): 150 mg via SUBCUTANEOUS
  Filled 2022-06-23 (×12): qty 1

## 2022-06-23 MED ORDER — DOCUSATE SODIUM 100 MG PO CAPS
100.0000 mg | ORAL_CAPSULE | Freq: Two times a day (BID) | ORAL | Status: DC
  Administered 2022-06-23 – 2022-06-28 (×10): 100 mg via ORAL
  Filled 2022-06-23 (×11): qty 1

## 2022-06-23 NOTE — Progress Notes (Signed)
PROGRESS NOTE    Bradley Torres  [id number] DOB: [birth year] DOA: 06/18/2022 PCP: Claretta Fraise, MD    Brief Narrative:  Bradley Torres is a 62 y.o. male with medical history significant of morbid obesity, NSTEMI, CKD 3, hypertension who presents to the emergency department due to abdominal pain which started this morning around 11 AM.  Pain was sharp and rated as 10/10 on pain scale, it was associated with nausea and aggravated with movement.  He denies fever, chills, chest pain, shortness of breath, nausea, vomiting, diarrhea.  Overnight on 3/25 went into a fib with RVR-- started on IV amiodarone and continues to have hgh HR    Assessment and Plan: Severe sepsis secondary to perforated sigmoid diverticulitis Patient met sepsis criteria due to being tachypneic and WBC> 12 (met SIRS criteria), source of infection was abdomen (met sepsis criteria), lactic acid was elevated at 4.0. Patient was seen by general surgery. CT was repeated on 3/27 which shows more free air.  General surgery does not think the patient needs urgent surgical intervention since he is otherwise stable with no change in exam findings. Zosyn being continued.  WBC is better.  Continue to monitor clinically for now.  Dr. Arnoldo Morale okay with trying stool softeners. Currently on clear liquids.  Depending on how he does over the next few days may need to consider TPN.   A fib with RVR Known history of atrial fibrillation.  Was not on any rate limiting medications at home.  Was on Xarelto which is currently on hold.  On full dose Lovenox. Cardiology is following.  Patient is on IV amiodarone.   Echocardiogram shows normal systolic function left atrial size was mildly dilated.  No significant valvular disease noted. TSH noted to be 6.5.  Free T4 is 1.01. Patient appears to have converted to sinus rhythm.  Continue to monitor on telemetry.  Cardiology to decrease the rate of amiodarone infusion.  Lactic acidosis -resolved    Type 2 diabetes mellitus with uncontrolled hyperglycemia Monitor CBGs.  Continue SSI.  Hypokalemia Continue to supplement potassium.  Magnesium is 2.2.   Prolonged QT interval Monitor electrolytes and correct as indicated.   Acute kidney injury on CKD 3B (baseline creatinine at 1.4-1.5) Monitor urine output.  Renal function seems to be stable.     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  On full dose Lovenox.     Essential hypertension Monitor blood pressures closely.   Mixed hyperlipidemia -Lipitor   Chronic diastolic heart failure -chronic and compensated -monitor while on IVF   OSA on CPAP -refuses to wear CPAP  Morbid obesity  Estimated body mass index is 51.52 kg/m as calculated from the following:   Height as of this encounter: 6' (1.829 m).   Weight as of this encounter: 172.3 kg.   DVT prophylaxis: SCDs Start: 06/19/22 0600 CODE STATUS: Full code Family communication: Discussed with patient. Disposition Plan: Hopefully return home when improved  Status is: Inpatient Remains inpatient appropriate because: Perforated diverticulitis, atrial fibrillation with RVR    Consultants:  General surgery Cardiology   Subjective: Continues to have abdominal pain but no worse than yesterday.  No nausea or vomiting.  Not passing any gas from below.  Denies any chest pain or shortness of breath.  Objective: Vitals:   06/23/22 0652 06/23/22 0700 06/23/22 0740 06/23/22 0808  BP:  128/75  (!) 142/77  Pulse: 83 73  72  Resp: 16 18  20  Temp:   98 F (36.7 C)   TempSrc:   Oral   SpO2: 96% 96%  97%  Weight:      Height:        Intake/Output Summary (Last 24 hours) at 06/23/2022 1045 Last data filed at 06/23/2022 0900 Gross per 24 hour  Intake 1615.28 ml  Output --  Net 1615.28 ml    Filed Weights   06/18/22 2051 06/22/22 0516  Weight: (!) 167.8 kg (!) 172.3 kg    Examination:  General  appearance: Awake alert.  In no distress Resp: Clear to auscultation bilaterally.  Normal effort Cardio: S1-S2 is normal regular.  No S3-S4.  No rubs murmurs or bruit GI: Abdomen is soft.  Tender in the lower abdomen with any rebound rigidity or guarding.  No masses organomegaly. Extremities: No edema.  Moving his extremities Neurologic: No focal neurological deficits.   Data Reviewed:   CBC: Recent Labs  Lab 06/19/22 0009 06/20/22 0409 06/21/22 0458 06/22/22 0423 06/23/22 0452  WBC 20.5* 15.8* 14.2* 11.6* 9.7  HGB 16.5 14.4 14.0 14.3 14.3  HCT 50.0 45.1 43.8 44.1 44.5  MCV 96.0 97.4 97.1 95.9 95.1  PLT 275 221 202 208 [id number]    Basic Metabolic Panel: Recent Labs  Lab 06/19/22 0009 06/19/22 0630 06/20/22 0409 06/20/22 0818 06/21/22 0458 06/22/22 0423 06/22/22 0509 06/23/22 0452  NA 133*  --  134*  --  132* 132*  --  133*  K 3.8  --  3.7  --  3.6 3.3*  --  3.5  CL 96*  --  97*  --  98 97*  --  97*  CO2 24  --  27  --  24 27  --  27  GLUCOSE 212*  --  120*  --  141* 137*  --  130*  BUN 22*  --  19  --  19 18  --  20  CREATININE 1.82*  --  1.47*  --  1.54* 1.39*  --  1.54*  CALCIUM 8.9  --  8.3*  --  7.9* 7.7*  --  7.8*  MG  --  1.6*  --  1.9  --   --  2.1 2.2  PHOS  --  3.4  --   --   --   --   --   --     GFR: Estimated Creatinine Clearance: 83.3 mL/min (A) (by C-G formula based on SCr of 1.54 mg/dL (H)). Liver Function Tests: Recent Labs  Lab 06/19/22 0009 06/20/22 0409  AST 23 13*  ALT 22 15  ALKPHOS 56 50  BILITOT 1.2 1.2  PROT 7.1 6.3*  ALBUMIN 3.4* 2.9*    Recent Labs  Lab 06/19/22 0009  LIPASE 28   No results for input(s): "HGBA1C" in the last 72 hours.  CBG: Recent Labs  Lab 06/22/22 1609 06/22/22 1931 06/23/22 0000 06/23/22 0406 06/23/22 0749  GLUCAP 175* 158* 150* 119* 165*     Thyroid Function Tests: Recent Labs    06/21/22 0458 06/23/22 0452  TSH 6.514*  --   FREET4  --  1.01     Sepsis Labs: Recent Labs  Lab  06/19/22 0153 06/19/22 0447 06/19/22 0630  LATICACIDVEN 4.0* 1.7 2.1*     Recent Results (from the past 240 hour(s))  Culture, blood (single)     Status: None (Preliminary result)   Collection Time: 06/19/22  1:53 AM   Specimen: BLOOD LEFT ARM  Result Value Ref Range Status  Specimen Description BLOOD LEFT ARM  Final   Special Requests   Final    BOTTLES DRAWN AEROBIC AND ANAEROBIC Blood Culture adequate volume   Culture   Final    NO GROWTH 4 DAYS Performed at [facility], [address], [place], [place] [address]    Report Status PENDING  Incomplete  MRSA Next Gen by PCR, Nasal     Status: None   Collection Time: 06/20/22 10:26 PM   Specimen: Nasal Mucosa; Nasal Swab  Result Value Ref Range Status   MRSA by PCR Next Gen NOT DETECTED NOT DETECTED Final    Comment: (NOTE) The GeneXpert MRSA Assay (FDA approved for NASAL specimens only), is one component of a comprehensive MRSA colonization surveillance program. It is not intended to diagnose MRSA infection nor to guide or monitor treatment for MRSA infections. Test performance is not FDA approved in patients less than 74 years old. Performed at [facility], [address], [place], [place] [address]          Radiology Studies: CT ABDOMEN PELVIS W CONTRAST  Result Date: 06/22/2022 CLINICAL DATA:  Diverticulitis, complication suspected, sepsis, continued abdominal pain EXAM: CT ABDOMEN AND PELVIS WITH CONTRAST TECHNIQUE: Multidetector CT imaging of the abdomen and pelvis was performed using the standard protocol following bolus administration of intravenous contrast. RADIATION DOSE REDUCTION: This exam was performed according to the departmental dose-optimization program which includes automated exposure control, adjustment of the mA and/or kV according to patient size and/or use of iterative reconstruction technique. CONTRAST:  11mL OMNIPAQUE IOHEXOL 300 MG/ML SOLN additional oral enteric contrast COMPARISON:  06/19/2022  FINDINGS: Lower chest: Trace bilateral pleural effusions and associated atelectasis or consolidation. Hepatobiliary: No solid liver abnormality is seen. Small gallstones. No gallbladder wall thickening, or biliary dilatation. Pancreas: Unremarkable. No pancreatic ductal dilatation or surrounding inflammatory changes. Spleen: Normal in size without significant abnormality. Adrenals/Urinary Tract: Adrenal glands are unremarkable. Simple, benign right renal cortical cysts, for which no further follow-up or characterization is required. Kidneys are otherwise normal, without renal calculi, solid lesion, or hydronephrosis. Bladder is unremarkable. Stomach/Bowel: Stomach is within normal limits. Appendix appears normal. Multiple thickened, matted appearing loops of mid small bowel in the ventral left hemiabdomen, appearance and configuration similar to prior examination (series 2, image 74). Descending and sigmoid diverticulosis. Similar appearance of mild wall thickening about the proximal to mid sigmoid with a small perforation arising from the anterior proximal sigmoid (series 2, image 84, 80). Vascular/Lymphatic: Aortic atherosclerosis. No enlarged abdominal or pelvic lymph nodes. Reproductive: No mass or other significant abnormality. Other: No abdominal wall hernia or abnormality. Increased volume of pneumoperitoneum compared to prior examination (series 2, image 60). Musculoskeletal: No acute or significant osseous findings. IMPRESSION: 1. Descending and sigmoid diverticulosis. Similar appearance of mild wall thickening about the proximal to mid sigmoid with a small perforation arising from the anterior proximal sigmoid consistent with diverticulitis complicated by perforation. No evidence of abscess at this time. 2. Increased volume of pneumoperitoneum compared to prior examination. 3. Multiple thickened, matted appearing loops of mid small bowel in the ventral left hemiabdomen, appearance and configuration similar  to prior examination. Findings are consistent with nonspecific infectious or inflammatory enteritis, generally of uncertain relation to diverticulitis described above. 4. Trace bilateral pleural effusions and associated atelectasis or consolidation. 5. Cholelithiasis. These results will be called to the ordering clinician or representative by the Radiologist Assistant, and communication documented in the PACS or [employer]. Aortic Atherosclerosis (ICD10-I70.0). Electronically Signed   By: Cristie Hem  Cherly Beach M.D.   On: 06/22/2022 11:56   ECHOCARDIOGRAM COMPLETE  Result Date: 06/21/2022    ECHOCARDIOGRAM REPORT   Patient Name:   PRANEEL VOGELER Date of Exam: 06/21/2022 Medical Rec #:  [id number]       Height:       72.0 in Accession #:    [id number]      Weight:       370.0 lb Date of Birth:  June 19, 1960        BSA:          2.767 m Patient Age:    53 years        BP:           106/91 mmHg Patient Gender: M               HR:           120 bpm. Exam Location:  [facility] Procedure: 2D Echo, Cardiac Doppler, Color Doppler and Intracardiac            Opacification Agent Indications:    Atrial Fibrillation I48.91  History:        Patient has prior history of Echocardiogram examinations, most                 recent 05/21/2019. CAD and Previous Myocardial Infarction, P.E.                 and Stroke; Risk Factors:Sleep Apnea, Diabetes, Dyslipidemia,                 Non-Smoker and Hypertension.  Sonographer:    Greer Pickerel Referring Phys: [phone] JESSICA U Eliseo Squires  Sonographer Comments: Patient is obese, suboptimal subcostal window and Technically difficult study due to poor echo windows. Image acquisition challenging due to respiratory motion. IMPRESSIONS  1. Left ventricular ejection fraction, by estimation, is 50 to 55% - difficuly evaluation in the setting of rapid atrial fibrillation and variable contraction from beat to beat. There is intermittent septal bounce in association with respiration. The left ventricle has low  normal function. The left ventricle demonstrates global hypokinesis. Left ventricular diastolic parameters are indeterminate.  2. Right ventricular systolic function is moderately reduced. The right ventricular size is mildly enlarged. There is mildly elevated pulmonary artery systolic pressure. The estimated right ventricular systolic pressure is [id number] mmHg.  3. Left atrial size was mildly dilated.  4. The mitral valve is grossly normal. Trivial mitral valve regurgitation.  5. The aortic valve is tricuspid. Aortic valve regurgitation is not visualized.  6. The inferior vena cava is dilated in size with <50% respiratory variability, suggesting right atrial pressure of 15 mmHg. Comparison(s): Prior images reviewed side by side. Difficult to compare since patient currently in rapid atrial fibrillation. Consider follow-up limited study once back in sinus rhythm. FINDINGS  Left Ventricle: Left ventricular ejection fraction, by estimation, is 50 to 55%. The left ventricle has low normal function. The left ventricle demonstrates global hypokinesis. Definity contrast agent was given IV to delineate the left ventricular endocardial borders. The left ventricular internal cavity size was normal in size. There is no left ventricular hypertrophy. Left ventricular diastolic function could not be evaluated due to atrial fibrillation. Left ventricular diastolic parameters are indeterminate. Right Ventricle: The right ventricular size is mildly enlarged. No increase in right ventricular wall thickness. Right ventricular systolic function is moderately reduced. There is mildly elevated pulmonary artery systolic pressure. The tricuspid regurgitant velocity is 2.61 m/s, and with an assumed right atrial  pressure of 15 mmHg, the estimated right ventricular systolic pressure is [id number] mmHg. Left Atrium: Left atrial size was mildly dilated. Right Atrium: Right atrial size was normal in size. Pericardium: There is no evidence of pericardial  effusion. Mitral Valve: The mitral valve is grossly normal. Trivial mitral valve regurgitation. Tricuspid Valve: The tricuspid valve is grossly normal. Tricuspid valve regurgitation is mild. Aortic Valve: The aortic valve is tricuspid. There is mild aortic valve annular calcification. Aortic valve regurgitation is not visualized. Pulmonic Valve: The pulmonic valve was grossly normal. Pulmonic valve regurgitation is trivial. Aorta: The aortic root is normal in size and structure. Venous: The inferior vena cava is dilated in size with less than 50% respiratory variability, suggesting right atrial pressure of 15 mmHg. IAS/Shunts: No atrial level shunt detected by color flow Doppler.  LEFT VENTRICLE PLAX 2D LVIDd:         4.70 cm   Diastology LVIDs:         3.50 cm   LV e' medial:    5.98 cm/s LV PW:         0.90 cm   LV E/e' medial:  11.5 LV IVS:        0.90 cm   LV e' lateral:   12.50 cm/s LVOT diam:     2.10 cm   LV E/e' lateral: 5.5 LV SV:         42 LV SV Index:   15 LVOT Area:     3.46 cm  RIGHT VENTRICLE RV S prime:     11.00 cm/s TAPSE (M-mode): 1.2 cm LEFT ATRIUM              Index        RIGHT ATRIUM           Index LA diam:        4.30 cm  1.55 cm/m   RA Area:     21.90 cm LA Vol (A2C):   116.0 ml 41.92 ml/m  RA Volume:   66.60 ml  24.07 ml/m LA Vol (A4C):   84.7 ml  30.61 ml/m LA Biplane Vol: 101.0 ml 36.50 ml/m  AORTIC VALVE LVOT Vmax:   83.60 cm/s LVOT Vmean:  53.600 cm/s LVOT VTI:    0.120 m  AORTA Ao Root diam: 4.20 cm MITRAL VALVE               TRICUSPID VALVE MV Area (PHT): 4.52 cm    TR Peak grad:   27.2 mmHg MV Decel Time: 168 msec    TR Vmax:        261.00 cm/s MV E velocity: 69.00 cm/s                            SHUNTS                            Systemic VTI:  0.12 m                            Systemic Diam: 2.10 cm Rozann Lesches MD Electronically signed by Rozann Lesches MD Signature Date/Time: 06/21/2022/2:44:21 PM    Final         Scheduled Meds:  atorvastatin  20 mg Oral Daily    Chlorhexidine Gluconate Cloth  6 each Topical Daily   enoxaparin (LOVENOX) injection  165 mg Subcutaneous Q12H  insulin aspart  0-9 Units Subcutaneous Q4H   Continuous Infusions:  sodium chloride Stopped (06/23/22 0532)   amiodarone 30 mg/hr (06/23/22 0836)   piperacillin-tazobactam (ZOSYN)  IV 12.5 mL/hr at 06/23/22 0700     LOS: 4 days     Bonnielee Haff,  [facility]  06/23/2022, 10:45 AM

## 2022-06-23 NOTE — Progress Notes (Signed)
ANTICOAGULATION CONSULT NOTE -   Pharmacy Consult for Lovenox Indication: hx PE and DVT  Allergies  Allergen Reactions   Actos [Pioglitazone] Cough    Some dyspnea & edema ( possibly some CHF?)   Lisinopril Other (See Comments)    wheezing   Bystolic [Nebivolol Hcl] Swelling   Ibuprofen Other (See Comments)    Rectal bleed   Spironolactone Hives   Trulicity [Dulaglutide]     Sob, dry cough, GI   Amlodipine Itching and Other (See Comments)    Leg pain.   Diflucan [Fluconazole] Rash   Hctz [Hydrochlorothiazide] Other (See Comments)    Caused gout flares.   Losartan Rash    Patient Measurements: Height: 6' (182.9 cm) Weight: (!) 172.3 kg (379 lb 13.6 oz) IBW/kg (Calculated) : 77.6 Heparin Dosing Weight:   Vital Signs: Temp: 97.9 F (36.6 C) (03/28 1122) Temp Source: Oral (03/28 1122) BP: 119/71 (03/28 1154) Pulse Rate: 85 (03/28 1154)  Labs: Recent Labs    06/21/22 0458 06/22/22 0423 06/23/22 0452 06/23/22 1317  HGB 14.0 14.3 14.3  --   HCT 43.8 44.1 44.5  --   PLT 202 208 227  --   HEPRLOWMOCWT  --   --   --  1.01  CREATININE 1.54* 1.39* 1.54*  --      Estimated Creatinine Clearance: 83.3 mL/min (A) (by C-G formula based on SCr of 1.54 mg/dL (H)).   Medical History: Past Medical History:  Diagnosis Date   CAD (coronary artery disease)    a. 12/2017: cath showing 10% Proximal-LAD stenosis with no significant obstructive disease and LVEDP mildly elevated at 18 mm Hg.    Chronic back pain    Diabetes mellitus, type 2 (HCC)    DVT (deep venous thrombosis) (HCC)    Essential hypertension    MI (myocardial infarction) ([place]) 2019   Morbid obesity ([place])    Panic disorder    Shoulder pain, left    Following fall   Sleep apnea    Stroke ([place]) 01/2018   Vitamin D deficiency     Medications:  Medications Prior to Admission  Medication Sig Dispense Refill Last Dose   allopurinol (ZYLOPRIM) 100 MG tablet Take 1 tablet (100 mg total) by mouth daily. 90  tablet 3 Past Week   atorvastatin (LIPITOR) 20 MG tablet Take 1 tablet (20 mg total) by mouth daily. 90 tablet 3 Past Week   chlorthalidone (HYGROTON) 25 MG tablet Take 1 tablet (25 mg total) by mouth every other day. 45 tablet 3 Past Week   Cholecalciferol (VITAMIN D3) 250 MCG (10000 UT) capsule Take 10,000 Units by mouth daily.   Past Week   dapagliflozin propanediol (FARXIGA) 10 MG TABS tablet TAKE ONE TABLET BY MOUTH DAILY BEFORE BREAKFAST 90 tablet 3 Past Week   DHEA 25 MG CAPS Take by mouth daily.   Past Week   doxycycline (VIBRA-TABS) 100 MG tablet Take 100 mg by mouth daily.   Past Week   gabapentin (NEURONTIN) 300 MG capsule Take 1 capsule by mouth daily as needed (pain).   unknown   hydrALAZINE (APRESOLINE) 50 MG tablet TAKE ONE TABLET BY MOUTH THREE TIMES DAILY (Patient taking differently: Take 50 mg by mouth daily.) 90 tablet 1 Past Week   metFORMIN (GLUCOPHAGE-XR) 750 MG 24 hr tablet Take 2 tablets (1,500 mg total) by mouth daily with breakfast. 180 tablet 1 Past Week   oxyCODONE-acetaminophen (PERCOCET) 10-325 MG tablet Take 1 tablet by mouth 3 (three) times daily as needed for  pain.   Past Week   rivaroxaban (XARELTO) 20 MG TABS tablet TAKE ONE TABLET BY MOUTH DAILY WITH SUPPER (Patient taking differently: Take 20 mg by mouth daily.) 90 tablet 3 Past Week at AM   sitaGLIPtin (JANUVIA) 100 MG tablet Take 1 tablet (100 mg total) by mouth daily. 90 tablet 3 Past Week   testosterone cypionate (DEPOTESTOSTERONE CYPIONATE) 200 MG/ML injection Inject 200 mg into the muscle every 14 (fourteen) days.   unknown   valsartan (DIOVAN) 160 MG tablet TAKE ONE TABLET BY MOUTH ONCE DAILY 90 tablet 1 Past Week   Accu-Chek Softclix Lancets lancets Use as instructed to monitor glucose twice daily 100 each 12    glucose blood (ACCU-CHEK GUIDE) test strip Use as instructed to monitor glucose twice daily 100 each 12     Assessment: Pharmacy consulted to dose Lovenox in patient with history of pulmonary  embolism and DVT.  Patient is on Xarelto prior to admission and currently holding due to possible surgical intervention.  LMWH level: 1.01- slightly supratherapeutic   Goal of Therapy:   Monitor platelets by anticoagulation protocol: Yes   Plan:  Change to lovenox 150 mg subq every 12 hours. Monitor Anti-Xa levels as needed  Margot Ables, PharmD Clinical Pharmacist 06/23/2022 3:43 PM

## 2022-06-23 NOTE — Progress Notes (Signed)
Subjective: Patient denies any significant abdominal pain.  No flatus or bowel movement yet.  Objective: Vital signs in last 24 hours: Temp:  [97.5 F (36.4 C)-98 F (36.7 C)] 98 F (36.7 C) (03/28 0740) Pulse Rate:  [41-132] 72 (03/28 0808) Resp:  [14-22] 20 (03/28 0808) BP: (103-158)/(51-108) 142/77 (03/28 0808) SpO2:  [83 %-98 %] 97 % (03/28 0808) Last BM Date : 06/18/22  Intake/Output from previous day: 03/27 0701 - 03/28 0700 In: 1255.3 [I.V.:1106.9; IV Piggyback:148.4] Out: 400 [Urine:400] Intake/Output this shift: Total I/O In: 360 [P.O.:360] Out: -   General appearance: alert, cooperative, and no distress GI: Soft with minimal left-sided abdominal pain.  Minimal bowel sounds appreciated.  No rigidity is noted.  Lab Results:  Recent Labs    06/22/22 0423 06/23/22 0452  WBC 11.6* 9.7  HGB 14.3 14.3  HCT 44.1 44.5  PLT 208 227   BMET Recent Labs    06/22/22 0423 06/23/22 0452  NA 132* 133*  K 3.3* 3.5  CL 97* 97*  CO2 27 27  GLUCOSE 137* 130*  BUN 18 20  CREATININE 1.39* 1.54*  CALCIUM 7.7* 7.8*   PT/INR No results for input(s): "LABPROT", "INR" in the last 72 hours.  Studies/Results: CT ABDOMEN PELVIS W CONTRAST  Result Date: 06/22/2022 CLINICAL DATA:  Diverticulitis, complication suspected, sepsis, continued abdominal pain EXAM: CT ABDOMEN AND PELVIS WITH CONTRAST TECHNIQUE: Multidetector CT imaging of the abdomen and pelvis was performed using the standard protocol following bolus administration of intravenous contrast. RADIATION DOSE REDUCTION: This exam was performed according to the departmental dose-optimization program which includes automated exposure control, adjustment of the mA and/or kV according to patient size and/or use of iterative reconstruction technique. CONTRAST:  141mL OMNIPAQUE IOHEXOL 300 MG/ML SOLN additional oral enteric contrast COMPARISON:  06/19/2022 FINDINGS: Lower chest: Trace bilateral pleural effusions and associated  atelectasis or consolidation. Hepatobiliary: No solid liver abnormality is seen. Small gallstones. No gallbladder wall thickening, or biliary dilatation. Pancreas: Unremarkable. No pancreatic ductal dilatation or surrounding inflammatory changes. Spleen: Normal in size without significant abnormality. Adrenals/Urinary Tract: Adrenal glands are unremarkable. Simple, benign right renal cortical cysts, for which no further follow-up or characterization is required. Kidneys are otherwise normal, without renal calculi, solid lesion, or hydronephrosis. Bladder is unremarkable. Stomach/Bowel: Stomach is within normal limits. Appendix appears normal. Multiple thickened, matted appearing loops of mid small bowel in the ventral left hemiabdomen, appearance and configuration similar to prior examination (series 2, image 74). Descending and sigmoid diverticulosis. Similar appearance of mild wall thickening about the proximal to mid sigmoid with a small perforation arising from the anterior proximal sigmoid (series 2, image 84, 80). Vascular/Lymphatic: Aortic atherosclerosis. No enlarged abdominal or pelvic lymph nodes. Reproductive: No mass or other significant abnormality. Other: No abdominal wall hernia or abnormality. Increased volume of pneumoperitoneum compared to prior examination (series 2, image 60). Musculoskeletal: No acute or significant osseous findings. IMPRESSION: 1. Descending and sigmoid diverticulosis. Similar appearance of mild wall thickening about the proximal to mid sigmoid with a small perforation arising from the anterior proximal sigmoid consistent with diverticulitis complicated by perforation. No evidence of abscess at this time. 2. Increased volume of pneumoperitoneum compared to prior examination. 3. Multiple thickened, matted appearing loops of mid small bowel in the ventral left hemiabdomen, appearance and configuration similar to prior examination. Findings are consistent with nonspecific  infectious or inflammatory enteritis, generally of uncertain relation to diverticulitis described above. 4. Trace bilateral pleural effusions and associated atelectasis or consolidation. 5. Cholelithiasis. These  results will be called to the ordering clinician or representative by the Radiologist Assistant, and communication documented in the PACS or [employer]. Aortic Atherosclerosis (ICD10-I70.0). Electronically Signed   By: Delanna Ahmadi M.D.   On: 06/22/2022 11:56   ECHOCARDIOGRAM COMPLETE  Result Date: 06/21/2022    ECHOCARDIOGRAM REPORT   Patient Name:   Bradley Torres Date of Exam: 06/21/2022 Medical Rec #:  [id number]       Height:       72.0 in Accession #:    [id number]      Weight:       370.0 lb Date of Birth:  1960-06-13        BSA:          2.767 m Patient Age:    62 years        BP:           106/91 mmHg Patient Gender: M               HR:           120 bpm. Exam Location:  [facility] Procedure: 2D Echo, Cardiac Doppler, Color Doppler and Intracardiac            Opacification Agent Indications:    Atrial Fibrillation I48.91  History:        Patient has prior history of Echocardiogram examinations, most                 recent 05/21/2019. CAD and Previous Myocardial Infarction, P.E.                 and Stroke; Risk Factors:Sleep Apnea, Diabetes, Dyslipidemia,                 Non-Smoker and Hypertension.  Sonographer:    Greer Pickerel Referring Phys: [phone] JESSICA U Eliseo Squires  Sonographer Comments: Patient is obese, suboptimal subcostal window and Technically difficult study due to poor echo windows. Image acquisition challenging due to respiratory motion. IMPRESSIONS  1. Left ventricular ejection fraction, by estimation, is 50 to 55% - difficuly evaluation in the setting of rapid atrial fibrillation and variable contraction from beat to beat. There is intermittent septal bounce in association with respiration. The left ventricle has low normal function. The left ventricle demonstrates global  hypokinesis. Left ventricular diastolic parameters are indeterminate.  2. Right ventricular systolic function is moderately reduced. The right ventricular size is mildly enlarged. There is mildly elevated pulmonary artery systolic pressure. The estimated right ventricular systolic pressure is [id number] mmHg.  3. Left atrial size was mildly dilated.  4. The mitral valve is grossly normal. Trivial mitral valve regurgitation.  5. The aortic valve is tricuspid. Aortic valve regurgitation is not visualized.  6. The inferior vena cava is dilated in size with <50% respiratory variability, suggesting right atrial pressure of 15 mmHg. Comparison(s): Prior images reviewed side by side. Difficult to compare since patient currently in rapid atrial fibrillation. Consider follow-up limited study once back in sinus rhythm. FINDINGS  Left Ventricle: Left ventricular ejection fraction, by estimation, is 50 to 55%. The left ventricle has low normal function. The left ventricle demonstrates global hypokinesis. Definity contrast agent was given IV to delineate the left ventricular endocardial borders. The left ventricular internal cavity size was normal in size. There is no left ventricular hypertrophy. Left ventricular diastolic function could not be evaluated due to atrial fibrillation. Left ventricular diastolic parameters are indeterminate. Right Ventricle: The right ventricular size is mildly enlarged. No increase in  right ventricular wall thickness. Right ventricular systolic function is moderately reduced. There is mildly elevated pulmonary artery systolic pressure. The tricuspid regurgitant velocity is 2.61 m/s, and with an assumed right atrial pressure of 15 mmHg, the estimated right ventricular systolic pressure is [id number] mmHg. Left Atrium: Left atrial size was mildly dilated. Right Atrium: Right atrial size was normal in size. Pericardium: There is no evidence of pericardial effusion. Mitral Valve: The mitral valve is grossly  normal. Trivial mitral valve regurgitation. Tricuspid Valve: The tricuspid valve is grossly normal. Tricuspid valve regurgitation is mild. Aortic Valve: The aortic valve is tricuspid. There is mild aortic valve annular calcification. Aortic valve regurgitation is not visualized. Pulmonic Valve: The pulmonic valve was grossly normal. Pulmonic valve regurgitation is trivial. Aorta: The aortic root is normal in size and structure. Venous: The inferior vena cava is dilated in size with less than 50% respiratory variability, suggesting right atrial pressure of 15 mmHg. IAS/Shunts: No atrial level shunt detected by color flow Doppler.  LEFT VENTRICLE PLAX 2D LVIDd:         4.70 cm   Diastology LVIDs:         3.50 cm   LV e' medial:    5.98 cm/s LV PW:         0.90 cm   LV E/e' medial:  11.5 LV IVS:        0.90 cm   LV e' lateral:   12.50 cm/s LVOT diam:     2.10 cm   LV E/e' lateral: 5.5 LV SV:         42 LV SV Index:   15 LVOT Area:     3.46 cm  RIGHT VENTRICLE RV S prime:     11.00 cm/s TAPSE (M-mode): 1.2 cm LEFT ATRIUM              Index        RIGHT ATRIUM           Index LA diam:        4.30 cm  1.55 cm/m   RA Area:     21.90 cm LA Vol (A2C):   116.0 ml 41.92 ml/m  RA Volume:   66.60 ml  24.07 ml/m LA Vol (A4C):   84.7 ml  30.61 ml/m LA Biplane Vol: 101.0 ml 36.50 ml/m  AORTIC VALVE LVOT Vmax:   83.60 cm/s LVOT Vmean:  53.600 cm/s LVOT VTI:    0.120 m  AORTA Ao Root diam: 4.20 cm MITRAL VALVE               TRICUSPID VALVE MV Area (PHT): 4.52 cm    TR Peak grad:   27.2 mmHg MV Decel Time: 168 msec    TR Vmax:        261.00 cm/s MV E velocity: 69.00 cm/s                            SHUNTS                            Systemic VTI:  0.12 m                            Systemic Diam: 2.10 cm Rozann Lesches MD Electronically signed by Rozann Lesches MD Signature Date/Time: 06/21/2022/2:44:21 PM    Final     Anti-infectives: Anti-infectives (  From admission, onward)    Start     Dose/Rate Route Frequency Ordered  Stop   06/19/22 0800  piperacillin-tazobactam (ZOSYN) IVPB 3.375 g        3.375 g 12.5 mL/hr over 240 Minutes Intravenous Every 8 hours 06/19/22 0624     06/19/22 0145  ceFEPIme (MAXIPIME) 2 g in sodium chloride 0.9 % 100 mL IVPB        2 g 200 mL/hr over 30 Minutes Intravenous  Once 06/19/22 0140 06/19/22 0226   06/19/22 0145  metroNIDAZOLE (FLAGYL) IVPB 500 mg        500 mg 100 mL/hr over 60 Minutes Intravenous  Once 06/19/22 0140 06/19/22 0255       Assessment/Plan: Impression: Sigmoid diverticulitis with perforation but no abscess.  White blood cell count continues to be normal.  Patient is progressing.  He does have an ileus.  Will get physical therapy to get him out of bed.  No need for acute surgical intervention at this time.  Patient understands and agrees.  Hopefully this will resolve with conservative management.  LOS: 4 days    Aviva Signs 06/23/2022

## 2022-06-23 NOTE — Progress Notes (Signed)
Rounding Note    Patient Name: Bradley Torres Date of Encounter: 06/23/2022  [facility] Cardiologist: Carlyle Dolly, MD   Subjective   No complaints  Inpatient Medications    Scheduled Meds:  atorvastatin  20 mg Oral Daily   Chlorhexidine Gluconate Cloth  6 each Topical Daily   enoxaparin (LOVENOX) injection  165 mg Subcutaneous Q12H   insulin aspart  0-9 Units Subcutaneous Q4H   Continuous Infusions:  sodium chloride 50 mL/hr at 06/22/22 2132   amiodarone 60 mg/hr (06/23/22 0817)   piperacillin-tazobactam (ZOSYN)  IV 3.375 g (06/23/22 0532)   PRN Meds: acetaminophen **OR** acetaminophen, HYDROmorphone (DILAUDID) injection, metoprolol tartrate, prochlorperazine   Vital Signs    Vitals:   06/23/22 0651 06/23/22 0652 06/23/22 0700 06/23/22 0740  BP:   128/75   Pulse: 69 83 73   Resp: 17 16 18    Temp:    98 F (36.7 C)  TempSrc:    Oral  SpO2: 96% 96% 96%   Weight:      Height:       No intake or output data in the 24 hours ending 06/23/22 0818    06/22/2022    5:16 AM 06/18/2022    8:51 PM 05/11/2022    1:24 PM  Last 3 Weights  Weight (lbs) 379 lb 13.6 oz 370 lb 374 lb 6.4 oz  Weight (kg) 172.3 kg 167.831 kg 169.827 kg      Telemetry    SR, PACs - Personally Reviewed  ECG    N/a - Personally Reviewed  Physical Exam   GEN: No acute distress.   Neck: No JVD Cardiac: RRR, no murmurs, rubs, or gallops.  Respiratory: Clear to auscultation bilaterally. GI: Soft, nontender, non-distended  MS: No edema; No deformity. Neuro:  Nonfocal  Psych: Normal affect   Labs    High Sensitivity Troponin:  No results for input(s): "TROPONINIHS" in the last 720 hours.   Chemistry Recent Labs  Lab 06/19/22 0009 06/19/22 0630 06/20/22 0409 06/20/22 0818 06/21/22 0458 06/22/22 0423 06/22/22 0509 06/23/22 0452  NA 133*  --  134*  --  132* 132*  --  133*  K 3.8  --  3.7  --  3.6 3.3*  --  3.5  CL 96*  --  97*  --  98 97*  --  97*  CO2 24  --   27  --  24 27  --  27  GLUCOSE 212*  --  120*  --  141* 137*  --  130*  BUN 22*  --  19  --  19 18  --  20  CREATININE 1.82*  --  1.47*  --  1.54* 1.39*  --  1.54*  CALCIUM 8.9  --  8.3*  --  7.9* 7.7*  --  7.8*  MG  --    < >  --  1.9  --   --  2.1 2.2  PROT 7.1  --  6.3*  --   --   --   --   --   ALBUMIN 3.4*  --  2.9*  --   --   --   --   --   AST 23  --  13*  --   --   --   --   --   ALT 22  --  15  --   --   --   --   --   ALKPHOS 56  --  50  --   --   --   --   --   BILITOT 1.2  --  1.2  --   --   --   --   --   GFRNONAA 42*  --  54*  --  51* 58*  --  51*  ANIONGAP 13  --  10  --  10 8  --  9   < > = values in this interval not displayed.    Lipids No results for input(s): "CHOL", "TRIG", "HDL", "LABVLDL", "LDLCALC", "CHOLHDL" in the last 168 hours.  Hematology Recent Labs  Lab 06/21/22 0458 06/22/22 0423 06/23/22 0452  WBC 14.2* 11.6* 9.7  RBC 4.51 4.60 4.68  HGB 14.0 14.3 14.3  HCT 43.8 44.1 44.5  MCV 97.1 95.9 95.1  MCH 31.0 31.1 30.6  MCHC 32.0 32.4 32.1  RDW 13.9 14.1 14.4  PLT 202 208 227   Thyroid  Recent Labs  Lab 06/21/22 0458  TSH 6.514*    BNPNo results for input(s): "BNP", "PROBNP" in the last 168 hours.  DDimer No results for input(s): "DDIMER" in the last 168 hours.   Radiology    CT ABDOMEN PELVIS W CONTRAST  Result Date: 06/22/2022 CLINICAL DATA:  Diverticulitis, complication suspected, sepsis, continued abdominal pain EXAM: CT ABDOMEN AND PELVIS WITH CONTRAST TECHNIQUE: Multidetector CT imaging of the abdomen and pelvis was performed using the standard protocol following bolus administration of intravenous contrast. RADIATION DOSE REDUCTION: This exam was performed according to the departmental dose-optimization program which includes automated exposure control, adjustment of the mA and/or kV according to patient size and/or use of iterative reconstruction technique. CONTRAST:  149mL OMNIPAQUE IOHEXOL 300 MG/ML SOLN additional oral enteric contrast  COMPARISON:  06/19/2022 FINDINGS: Lower chest: Trace bilateral pleural effusions and associated atelectasis or consolidation. Hepatobiliary: No solid liver abnormality is seen. Small gallstones. No gallbladder wall thickening, or biliary dilatation. Pancreas: Unremarkable. No pancreatic ductal dilatation or surrounding inflammatory changes. Spleen: Normal in size without significant abnormality. Adrenals/Urinary Tract: Adrenal glands are unremarkable. Simple, benign right renal cortical cysts, for which no further follow-up or characterization is required. Kidneys are otherwise normal, without renal calculi, solid lesion, or hydronephrosis. Bladder is unremarkable. Stomach/Bowel: Stomach is within normal limits. Appendix appears normal. Multiple thickened, matted appearing loops of mid small bowel in the ventral left hemiabdomen, appearance and configuration similar to prior examination (series 2, image 74). Descending and sigmoid diverticulosis. Similar appearance of mild wall thickening about the proximal to mid sigmoid with a small perforation arising from the anterior proximal sigmoid (series 2, image 84, 80). Vascular/Lymphatic: Aortic atherosclerosis. No enlarged abdominal or pelvic lymph nodes. Reproductive: No mass or other significant abnormality. Other: No abdominal wall hernia or abnormality. Increased volume of pneumoperitoneum compared to prior examination (series 2, image 60). Musculoskeletal: No acute or significant osseous findings. IMPRESSION: 1. Descending and sigmoid diverticulosis. Similar appearance of mild wall thickening about the proximal to mid sigmoid with a small perforation arising from the anterior proximal sigmoid consistent with diverticulitis complicated by perforation. No evidence of abscess at this time. 2. Increased volume of pneumoperitoneum compared to prior examination. 3. Multiple thickened, matted appearing loops of mid small bowel in the ventral left hemiabdomen, appearance  and configuration similar to prior examination. Findings are consistent with nonspecific infectious or inflammatory enteritis, generally of uncertain relation to diverticulitis described above. 4. Trace bilateral pleural effusions and associated atelectasis or consolidation. 5. Cholelithiasis. These results will be called to the ordering clinician or representative  by the Radiologist Assistant, and communication documented in the PACS or [employer]. Aortic Atherosclerosis (ICD10-I70.0). Electronically Signed   By: Delanna Ahmadi M.D.   On: 06/22/2022 11:56   ECHOCARDIOGRAM COMPLETE  Result Date: 06/21/2022    ECHOCARDIOGRAM REPORT   Patient Name:   Bradley Torres Date of Exam: 06/21/2022 Medical Rec #:  [id number]       Height:       72.0 in Accession #:    [id number]      Weight:       370.0 lb Date of Birth:  [birth year]        BSA:          2.767 m Patient Age:    12 years        BP:           106/91 mmHg Patient Gender: M               HR:           120 bpm. Exam Location:  [facility] Procedure: 2D Echo, Cardiac Doppler, Color Doppler and Intracardiac            Opacification Agent Indications:    Atrial Fibrillation I48.91  History:        Patient has prior history of Echocardiogram examinations, most                 recent 05/21/2019. CAD and Previous Myocardial Infarction, P.E.                 and Stroke; Risk Factors:Sleep Apnea, Diabetes, Dyslipidemia,                 Non-Smoker and Hypertension.  Sonographer:    Greer Pickerel Referring Phys: [id number] JESSICA U Eliseo Squires  Sonographer Comments: Patient is obese, suboptimal subcostal window and Technically difficult study due to poor echo windows. Image acquisition challenging due to respiratory motion. IMPRESSIONS  1. Left ventricular ejection fraction, by estimation, is 50 to 55% - difficuly evaluation in the setting of rapid atrial fibrillation and variable contraction from beat to beat. There is intermittent septal bounce in association with respiration. The  left ventricle has low normal function. The left ventricle demonstrates global hypokinesis. Left ventricular diastolic parameters are indeterminate.  2. Right ventricular systolic function is moderately reduced. The right ventricular size is mildly enlarged. There is mildly elevated pulmonary artery systolic pressure. The estimated right ventricular systolic pressure is [id number] mmHg.  3. Left atrial size was mildly dilated.  4. The mitral valve is grossly normal. Trivial mitral valve regurgitation.  5. The aortic valve is tricuspid. Aortic valve regurgitation is not visualized.  6. The inferior vena cava is dilated in size with <50% respiratory variability, suggesting right atrial pressure of 15 mmHg. Comparison(s): Prior images reviewed side by side. Difficult to compare since patient currently in rapid atrial fibrillation. Consider follow-up limited study once back in sinus rhythm. FINDINGS  Left Ventricle: Left ventricular ejection fraction, by estimation, is 50 to 55%. The left ventricle has low normal function. The left ventricle demonstrates global hypokinesis. Definity contrast agent was given IV to delineate the left ventricular endocardial borders. The left ventricular internal cavity size was normal in size. There is no left ventricular hypertrophy. Left ventricular diastolic function could not be evaluated due to atrial fibrillation. Left ventricular diastolic parameters are indeterminate. Right Ventricle: The right ventricular size is mildly enlarged. No increase in right ventricular wall thickness. Right ventricular systolic function is moderately  reduced. There is mildly elevated pulmonary artery systolic pressure. The tricuspid regurgitant velocity is 2.61 m/s, and with an assumed right atrial pressure of 15 mmHg, the estimated right ventricular systolic pressure is [id number] mmHg. Left Atrium: Left atrial size was mildly dilated. Right Atrium: Right atrial size was normal in size. Pericardium: There is no  evidence of pericardial effusion. Mitral Valve: The mitral valve is grossly normal. Trivial mitral valve regurgitation. Tricuspid Valve: The tricuspid valve is grossly normal. Tricuspid valve regurgitation is mild. Aortic Valve: The aortic valve is tricuspid. There is mild aortic valve annular calcification. Aortic valve regurgitation is not visualized. Pulmonic Valve: The pulmonic valve was grossly normal. Pulmonic valve regurgitation is trivial. Aorta: The aortic root is normal in size and structure. Venous: The inferior vena cava is dilated in size with less than 50% respiratory variability, suggesting right atrial pressure of 15 mmHg. IAS/Shunts: No atrial level shunt detected by color flow Doppler.  LEFT VENTRICLE PLAX 2D LVIDd:         4.70 cm   Diastology LVIDs:         3.50 cm   LV e' medial:    5.98 cm/s LV PW:         0.90 cm   LV E/e' medial:  11.5 LV IVS:        0.90 cm   LV e' lateral:   12.50 cm/s LVOT diam:     2.10 cm   LV E/e' lateral: 5.5 LV SV:         42 LV SV Index:   15 LVOT Area:     3.46 cm  RIGHT VENTRICLE RV S prime:     11.00 cm/s TAPSE (M-mode): 1.2 cm LEFT ATRIUM              Index        RIGHT ATRIUM           Index LA diam:        4.30 cm  1.55 cm/m   RA Area:     21.90 cm LA Vol (A2C):   116.0 ml 41.92 ml/m  RA Volume:   66.60 ml  24.07 ml/m LA Vol (A4C):   84.7 ml  30.61 ml/m LA Biplane Vol: 101.0 ml 36.50 ml/m  AORTIC VALVE LVOT Vmax:   83.60 cm/s LVOT Vmean:  53.600 cm/s LVOT VTI:    0.120 m  AORTA Ao Root diam: 4.20 cm MITRAL VALVE               TRICUSPID VALVE MV Area (PHT): 4.52 cm    TR Peak grad:   27.2 mmHg MV Decel Time: 168 msec    TR Vmax:        261.00 cm/s MV E velocity: 69.00 cm/s                            SHUNTS                            Systemic VTI:  0.12 m                            Systemic Diam: 2.10 cm Rozann Lesches MD Electronically signed by Rozann Lesches MD Signature Date/Time: 06/21/2022/2:44:21 PM    Final     Cardiac Studies   05/2022  echo 1. Left ventricular ejection fraction,  by estimation, is 50 to 55% -  difficuly evaluation in the setting of rapid atrial fibrillation and  variable contraction from beat to beat. There is intermittent septal  bounce in association with respiration. The  left ventricle has low normal function. The left ventricle demonstrates  global hypokinesis. Left ventricular diastolic parameters are  indeterminate.   2. Right ventricular systolic function is moderately reduced. The right  ventricular size is mildly enlarged. There is mildly elevated pulmonary  artery systolic pressure. The estimated right ventricular systolic  pressure is [id number] mmHg.   3. Left atrial size was mildly dilated.   4. The mitral valve is grossly normal. Trivial mitral valve  regurgitation.   5. The aortic valve is tricuspid. Aortic valve regurgitation is not  visualized.   6. The inferior vena cava is dilated in size with <50% respiratory  variability, suggesting right atrial pressure of 15 mmHg.   Patient Profile     Bradley Torres is a 62 y.o. male with a hx of CAD (s/p cath in 2019 only showing 10% proximal LAD stenosis), palpitations, HTN, Stage 3 CKD, paroxysmal atrial fibrillation, history of PE who is being seen 06/21/2022 for the evaluation of atrial fibrillation with RVR at the request of Dr. Eliseo Squires.   Assessment & Plan    1.Afib with RVR - history of PAF - was in SR on admission, later issues with afib with RVR this admission in setting of acute illness with sepsis/perforated bowel - management complicated by low bp's, started on IV amiodarone - back in SR today, can lower his amio gtt to 30mg /hr. When taking PO can transition to oral amio. Likely d/c amio in outpatient setting at f/u.  - transition back to [place] when taking PO and remains clear no indication for surgical or invasive procedures.    2.History of PE/DVT - on lovenox in inpatient setting, at home on xarelto which is on hold   3. Severe  sepsis due to perforated sigmoid diverticulitis - abx per primary team - followed by gen surgery - managed conservatively at this time without plans for surgery, currently on bowel rest  For questions or updates, please contact [place] Please consult [email or web address] for contact info under        Signed, Carlyle Dolly, MD  06/23/2022, 8:18 AM

## 2022-06-24 DIAGNOSIS — A419 Sepsis, unspecified organism: Secondary | ICD-10-CM | POA: Diagnosis not present

## 2022-06-24 DIAGNOSIS — I480 Paroxysmal atrial fibrillation: Secondary | ICD-10-CM | POA: Diagnosis not present

## 2022-06-24 DIAGNOSIS — R6520 Severe sepsis without septic shock: Secondary | ICD-10-CM | POA: Diagnosis not present

## 2022-06-24 DIAGNOSIS — E876 Hypokalemia: Secondary | ICD-10-CM | POA: Diagnosis not present

## 2022-06-24 DIAGNOSIS — K5720 Diverticulitis of large intestine with perforation and abscess without bleeding: Secondary | ICD-10-CM | POA: Diagnosis not present

## 2022-06-24 LAB — BASIC METABOLIC PANEL
Anion gap: 8 (ref 5–15)
BUN: 20 mg/dL (ref 6–20)
CO2: 27 mmol/L (ref 22–32)
Calcium: 7.9 mg/dL — ABNORMAL LOW (ref 8.9–10.3)
Chloride: 98 mmol/L (ref 98–111)
Creatinine, Ser: 1.61 mg/dL — ABNORMAL HIGH (ref 0.61–1.24)
GFR, Estimated: 49 mL/min — ABNORMAL LOW (ref >60)
Glucose, Bld: 137 mg/dL — ABNORMAL HIGH (ref 70–99)
Potassium: 3.7 mmol/L (ref 3.5–5.1)
Sodium: 133 mmol/L — ABNORMAL LOW (ref 135–145)

## 2022-06-24 LAB — GLUCOSE, CAPILLARY
Glucose-Capillary: 143 mg/dL — ABNORMAL HIGH (ref 70–99)
Glucose-Capillary: 129 mg/dL — ABNORMAL HIGH (ref 70–99)
Glucose-Capillary: 140 mg/dL — ABNORMAL HIGH (ref 70–99)
Glucose-Capillary: 172 mg/dL — ABNORMAL HIGH (ref 70–99)
Glucose-Capillary: 169 mg/dL — ABNORMAL HIGH (ref 70–99)
Glucose-Capillary: 175 mg/dL — ABNORMAL HIGH (ref 70–99)
Glucose-Capillary: 203 mg/dL — ABNORMAL HIGH (ref 70–99)

## 2022-06-24 LAB — CULTURE, BLOOD (SINGLE)
Culture: NO GROWTH
Special Requests: ADEQUATE

## 2022-06-24 LAB — CBC
HCT: 44.2 % (ref 39.0–52.0)
Hemoglobin: 14.4 g/dL (ref 13.0–17.0)
MCH: 31 pg (ref 26.0–34.0)
MCHC: 32.6 g/dL (ref 30.0–36.0)
MCV: 95.1 fL (ref 80.0–100.0)
Platelets: 268 10*3/uL (ref 150–400)
RBC: 4.65 MIL/uL (ref 4.22–5.81)
RDW: 14.8 % (ref 11.5–15.5)
WBC: 13 10*3/uL — ABNORMAL HIGH (ref 4.0–10.5)
nRBC: 0 % (ref 0.0–0.2)

## 2022-06-24 MED ORDER — SENNOSIDES-DOCUSATE SODIUM 8.6-50 MG PO TABS
2.0000 | ORAL_TABLET | Freq: Every day | ORAL | Status: DC
  Administered 2022-06-24 – 2022-06-27 (×3): 2 via ORAL
  Filled 2022-06-24 (×4): qty 2

## 2022-06-24 MED ORDER — BOOST / RESOURCE BREEZE PO LIQD CUSTOM
1.0000 | Freq: Three times a day (TID) | ORAL | Status: DC
  Administered 2022-06-24 – 2022-06-27 (×5): 1 via ORAL

## 2022-06-24 MED ORDER — POTASSIUM CHLORIDE CRYS ER 20 MEQ PO TBCR
40.0000 meq | EXTENDED_RELEASE_TABLET | Freq: Once | ORAL | Status: AC
  Administered 2022-06-24: 40 meq via ORAL
  Filled 2022-06-24: qty 2

## 2022-06-24 MED ORDER — AMIODARONE HCL 200 MG PO TABS
200.0000 mg | ORAL_TABLET | Freq: Two times a day (BID) | ORAL | Status: DC
  Administered 2022-06-24 – 2022-06-28 (×9): 200 mg via ORAL
  Filled 2022-06-24 (×9): qty 1

## 2022-06-24 NOTE — Progress Notes (Signed)
Physical Therapy Treatment Patient Details Name: Bradley Torres MRN: [id number] DOB: 1960-10-13 Today's Date: 06/24/2022   History of Present Illness pt is a 62 y.o. male with medical history significant of morbid obesity, NSTEMI, CKD 3, hypertension who presents to the emergency department due to abdominal pain which started this morning around 11 AM.  Pain was sharp and rated as 10/10 on pain scale, it was associated with nausea and aggravated with movement.  He denies fever, chills, chest pain, shortness of breath, nausea, vomiting, diarrhea.  Overnight on 3/25 went into a fib with RVR-- started on IV amiodarone and continues to have hgh HR    PT Comments    O2 sat remains in mid 90s throughout session on 2L. Patient requires HHA to pull to seated EOB. He demonstrates good sitting balance and sitting tolerance at bedside while completing exercises. Patient assisted to standing with HHA and RW and ambulates increased distance today with labored cadence. Patient set up in chair at end of session. Patient will benefit from continued skilled physical therapy in hospital and recommended venue below to increase strength, balance, endurance for safe ADLs and gait.    Recommendations for follow up therapy are one component of a multi-disciplinary discharge planning process, led by the attending physician.  Recommendations may be updated based on patient status, additional functional criteria and insurance authorization.  Follow Up Recommendations       Assistance Recommended at Discharge Set up Supervision/Assistance  Patient can return home with the following A little help with walking and/or transfers;A little help with bathing/dressing/bathroom;Assistance with cooking/housework   Equipment Recommendations       Recommendations for Other Services       Precautions / Restrictions Precautions Precautions: Fall Restrictions Weight Bearing Restrictions: No     Mobility  Bed  Mobility Overal bed mobility: Needs Assistance Bed Mobility: Supine to Sit     Supine to sit: Min guard     General bed mobility comments: requires increased time and assist to pull trunk fully upright    Transfers Overall transfer level: Needs assistance Equipment used: Rolling walker (2 wheels), 1 person hand held assist Transfers: Sit to/from Stand Sit to Stand: Min guard           General transfer comment: RW and HHA to transfer to standing    Ambulation/Gait Ambulation/Gait assistance: Min guard Gait Distance (Feet): 100 Feet Assistive device: Rolling walker (2 wheels) Gait Pattern/deviations: Step-through pattern, Decreased stride length       General Gait Details: labored cadence with RW   Stairs             Wheelchair Mobility    Modified Rankin (Stroke Patients Only)       Balance Overall balance assessment: Mild deficits observed, not formally tested                                          Cognition Arousal/Alertness: Awake/alert Behavior During Therapy: WFL for tasks assessed/performed Overall Cognitive Status: Within Functional Limits for tasks assessed                                          Exercises General Exercises - Lower Extremity Ankle Circles/Pumps: AROM, Both, 15 reps, Seated Long Arc Quad: AROM, Both, 15 reps, Seated  Hip Flexion/Marching: AROM, Both, 15 reps, Seated    General Comments        Pertinent Vitals/Pain Pain Assessment Pain Assessment: Faces Faces Pain Scale: Hurts little more Pain Location: "belly" Pain Descriptors / Indicators: Aching, Sharp Pain Intervention(s): Limited activity within patient's tolerance, Monitored during session, Repositioned    Home Living                          Prior Function            PT Goals (current goals can now be found in the care plan section) Acute Rehab PT Goals Patient Stated Goal: "go home" PT Goal Formulation:  With patient Time For Goal Achievement: 07/06/22 Potential to Achieve Goals: Good Progress towards PT goals: Progressing toward goals    Frequency    Min 2X/week      PT Plan Current plan remains appropriate    Co-evaluation PT/OT/SLP Co-Evaluation/Treatment: Yes Reason for Co-Treatment: To address functional/ADL transfers PT goals addressed during session: Mobility/safety with mobility;Balance;Strengthening/ROM        AM-PAC PT "6 Clicks" Mobility   Outcome Measure  Help needed turning from your back to your side while in a flat bed without using bedrails?: None Help needed moving from lying on your back to sitting on the side of a flat bed without using bedrails?: None Help needed moving to and from a bed to a chair (including a wheelchair)?: None Help needed standing up from a chair using your arms (e.g., wheelchair or bedside chair)?: A Little Help needed to walk in hospital room?: A Little Help needed climbing 3-5 steps with a railing? : A Lot 6 Click Score: 20    End of Session Equipment Utilized During Treatment: Oxygen Activity Tolerance: Patient tolerated treatment well Patient left: in chair;with call bell/phone within reach Nurse Communication: Mobility status PT Visit Diagnosis: Unsteadiness on feet (R26.81);Other abnormalities of gait and mobility (R26.89);Difficulty in walking, not elsewhere classified (R26.2)     Time: [id number] PT Time Calculation (min) (ACUTE ONLY): 27 min  Charges:  $Therapeutic Exercise: 8-22 mins                     11:19 AM, 06/24/22 Mearl Latin PT, DPT Physical Therapist at [facility]

## 2022-06-24 NOTE — Progress Notes (Signed)
Rounding Note    Patient Name: Bradley Torres Date of Encounter: 06/24/2022  [place] Cardiologist: Carlyle Dolly, MD   Subjective   Patient comfortable sitting in chair    Inpatient Medications    Scheduled Meds:  atorvastatin  20 mg Oral Daily   Chlorhexidine Gluconate Cloth  6 each Topical Daily   docusate sodium  100 mg Oral BID   enoxaparin (LOVENOX) injection  150 mg Subcutaneous Q12H   insulin aspart  0-9 Units Subcutaneous Q4H   Continuous Infusions:  sodium chloride Stopped (06/23/22 0532)   amiodarone 30 mg/hr (06/23/22 2104)   piperacillin-tazobactam (ZOSYN)  IV 3.375 g (06/24/22 0527)   PRN Meds: acetaminophen **OR** acetaminophen, HYDROmorphone (DILAUDID) injection, metoprolol tartrate, prochlorperazine   Vital Signs    Vitals:   06/24/22 0500 06/24/22 0700 06/24/22 0800 06/24/22 0806  BP: (!) 152/67   121/68  Pulse: 84 70  73  Resp: 19 14  (!) 25  Temp:   97.6 F (36.4 C)   TempSrc:   Oral   SpO2: 93% (!) 88% 95% 94%  Weight:      Height:        Intake/Output Summary (Last 24 hours) at 06/24/2022 1011 Last data filed at 06/23/2022 2200 Gross per 24 hour  Intake --  Output 1970 ml  Net -1970 ml      06/22/2022    5:16 AM 06/18/2022    8:51 PM 05/11/2022    1:24 PM  Last 3 Weights  Weight (lbs) 379 lb 13.6 oz 370 lb 374 lb 6.4 oz  Weight (kg) 172.3 kg 167.831 kg 169.827 kg      Telemetry    SR, PACs  80s  - Personally Reviewed  ECG    N/a - Personally Reviewed  Physical Exam   GEN  Obese 62 yo in NAD  Neck: No JVD Cardiac: RRR, no murmurs,  Respiratory: Clear to auscultation  GI: Soft, obese   No masses   MS: No edema; No deformity.   Labs    High Sensitivity Troponin:  No results for input(s): "TROPONINIHS" in the last 720 hours.   Chemistry Recent Labs  Lab 06/19/22 0009 06/19/22 0630 06/20/22 0409 06/20/22 0818 06/21/22 0458 06/22/22 0423 06/22/22 0509 06/23/22 0452 06/24/22 0450  NA 133*  --   134*  --    < > 132*  --  133* 133*  K 3.8  --  3.7  --    < > 3.3*  --  3.5 3.7  CL 96*  --  97*  --    < > 97*  --  97* 98  CO2 24  --  27  --    < > 27  --  27 27  GLUCOSE 212*  --  120*  --    < > 137*  --  130* 137*  BUN 22*  --  19  --    < > 18  --  20 20  CREATININE 1.82*  --  1.47*  --    < > 1.39*  --  1.54* 1.61*  CALCIUM 8.9  --  8.3*  --    < > 7.7*  --  7.8* 7.9*  MG  --    < >  --  1.9  --   --  2.1 2.2  --   PROT 7.1  --  6.3*  --   --   --   --   --   --  ALBUMIN 3.4*  --  2.9*  --   --   --   --   --   --   AST 23  --  13*  --   --   --   --   --   --   ALT 22  --  15  --   --   --   --   --   --   ALKPHOS 56  --  50  --   --   --   --   --   --   BILITOT 1.2  --  1.2  --   --   --   --   --   --   GFRNONAA 42*  --  54*  --    < > 58*  --  51* 49*  ANIONGAP 13  --  10  --    < > 8  --  9 8   < > = values in this interval not displayed.    Lipids No results for input(s): "CHOL", "TRIG", "HDL", "LABVLDL", "LDLCALC", "CHOLHDL" in the last 168 hours.  Hematology Recent Labs  Lab 06/22/22 0423 06/23/22 0452 06/24/22 0450  WBC 11.6* 9.7 13.0*  RBC 4.60 4.68 4.65  HGB 14.3 14.3 14.4  HCT 44.1 44.5 44.2  MCV 95.9 95.1 95.1  MCH 31.1 30.6 31.0  MCHC 32.4 32.1 32.6  RDW 14.1 14.4 14.8  PLT 208 227 268   Thyroid  Recent Labs  Lab 06/21/22 0458 06/23/22 0452  TSH 6.514*  --   FREET4  --  1.01    BNPNo results for input(s): "BNP", "PROBNP" in the last 168 hours.  DDimer No results for input(s): "DDIMER" in the last 168 hours.   Radiology    CT ABDOMEN PELVIS W CONTRAST  Result Date: 06/22/2022 CLINICAL DATA:  Diverticulitis, complication suspected, sepsis, continued abdominal pain EXAM: CT ABDOMEN AND PELVIS WITH CONTRAST TECHNIQUE: Multidetector CT imaging of the abdomen and pelvis was performed using the standard protocol following bolus administration of intravenous contrast. RADIATION DOSE REDUCTION: This exam was performed according to the departmental  dose-optimization program which includes automated exposure control, adjustment of the mA and/or kV according to patient size and/or use of iterative reconstruction technique. CONTRAST:  155mL OMNIPAQUE IOHEXOL 300 MG/ML SOLN additional oral enteric contrast COMPARISON:  06/19/2022 FINDINGS: Lower chest: Trace bilateral pleural effusions and associated atelectasis or consolidation. Hepatobiliary: No solid liver abnormality is seen. Small gallstones. No gallbladder wall thickening, or biliary dilatation. Pancreas: Unremarkable. No pancreatic ductal dilatation or surrounding inflammatory changes. Spleen: Normal in size without significant abnormality. Adrenals/Urinary Tract: Adrenal glands are unremarkable. Simple, benign right renal cortical cysts, for which no further follow-up or characterization is required. Kidneys are otherwise normal, without renal calculi, solid lesion, or hydronephrosis. Bladder is unremarkable. Stomach/Bowel: Stomach is within normal limits. Appendix appears normal. Multiple thickened, matted appearing loops of mid small bowel in the ventral left hemiabdomen, appearance and configuration similar to prior examination (series 2, image 74). Descending and sigmoid diverticulosis. Similar appearance of mild wall thickening about the proximal to mid sigmoid with a small perforation arising from the anterior proximal sigmoid (series 2, image 84, 80). Vascular/Lymphatic: Aortic atherosclerosis. No enlarged abdominal or pelvic lymph nodes. Reproductive: No mass or other significant abnormality. Other: No abdominal wall hernia or abnormality. Increased volume of pneumoperitoneum compared to prior examination (series 2, image 60). Musculoskeletal: No acute or significant osseous findings. IMPRESSION: 1. Descending and sigmoid diverticulosis. Similar appearance  of mild wall thickening about the proximal to mid sigmoid with a small perforation arising from the anterior proximal sigmoid consistent with  diverticulitis complicated by perforation. No evidence of abscess at this time. 2. Increased volume of pneumoperitoneum compared to prior examination. 3. Multiple thickened, matted appearing loops of mid small bowel in the ventral left hemiabdomen, appearance and configuration similar to prior examination. Findings are consistent with nonspecific infectious or inflammatory enteritis, generally of uncertain relation to diverticulitis described above. 4. Trace bilateral pleural effusions and associated atelectasis or consolidation. 5. Cholelithiasis. These results will be called to the ordering clinician or representative by the Radiologist Assistant, and communication documented in the PACS or [employer]. Aortic Atherosclerosis (ICD10-I70.0). Electronically Signed   By: Delanna Ahmadi M.D.   On: 06/22/2022 11:56    Cardiac Studies   05/2022 echo 1. Left ventricular ejection fraction, by estimation, is 50 to 55% -  difficuly evaluation in the setting of rapid atrial fibrillation and  variable contraction from beat to beat. There is intermittent septal  bounce in association with respiration. The  left ventricle has low normal function. The left ventricle demonstrates  global hypokinesis. Left ventricular diastolic parameters are  indeterminate.   2. Right ventricular systolic function is moderately reduced. The right  ventricular size is mildly enlarged. There is mildly elevated pulmonary  artery systolic pressure. The estimated right ventricular systolic  pressure is [id number] mmHg.   3. Left atrial size was mildly dilated.   4. The mitral valve is grossly normal. Trivial mitral valve  regurgitation.   5. The aortic valve is tricuspid. Aortic valve regurgitation is not  visualized.   6. The inferior vena cava is dilated in size with <50% respiratory  variability, suggesting right atrial pressure of 15 mmHg.   Patient Profile     DANNIAL DRENNEN is a 62 y.o. male with a hx of CAD (s/p cath in  2019 only showing 10% proximal LAD stenosis), palpitations, HTN, Stage 3 CKD, paroxysmal atrial fibrillation, history of PE who is being seen 06/21/2022 for the evaluation of atrial fibrillation with RVR at the request of Dr. Eliseo Squires.   Assessment & Plan    1.Afib with RVR - Pt with history of PAF - was in SR on admission, later issues with afib with RVR this admission in setting of acute illness with sepsis/perforated bowel - management complicated by low bp's, started on IV amiodarone - Remains in SR with PACs    I would switch to po amiodarone 200 bid  for now until clinically improved   WOuld probably stop at discharge  Follow rhythm as outpt WIll be on anticoagulant after     2.History of PE/DVT - on lovenox in inpatient setting, at home on xarelto which is on hold  3  Hx CAD   Minimal by cath in 2019      4. Severe sepsis due to perforated sigmoid diverticulitis - abx per primary team - followed by gen surgery - managed conservatively at this time without plans for surgery, currently on bowel rest  5  OSA  Refuses CPAP    For questions or updates, please contact [place] Please consult [email or web address] for contact info under        Signed, Dorris Carnes, MD  06/24/2022, 10:11 AM

## 2022-06-24 NOTE — Plan of Care (Signed)
  Problem: Acute Rehab OT Goals (only OT should resolve) Goal: Pt. Will Perform Upper Body Bathing Flowsheets (Taken 06/24/2022 1126) Pt Will Perform Upper Body Bathing: with set-up Goal: Pt. Will Perform Lower Body Dressing Flowsheets (Taken 06/24/2022 1126) Pt Will Perform Lower Body Dressing: with supervision Goal: Pt. Will Transfer To Toilet Flowsheets (Taken 06/24/2022 1126) Pt Will Transfer to Toilet: with supervision   Arvil Persons, OTR/L

## 2022-06-24 NOTE — Evaluation (Signed)
Occupational Therapy Evaluation Patient Details Name: Bradley Torres MRN: [id number] DOB: 12-Oct-1960 Today's Date: 06/24/2022   History of Present Illness pt is a 62 y.o. male with medical history significant of morbid obesity, NSTEMI, CKD 3, hypertension who presents to the emergency department due to abdominal pain which started this morning around 11 AM.  Pain was sharp and rated as 10/10 on pain scale, it was associated with nausea and aggravated with movement.  He denies fever, chills, chest pain, shortness of breath, nausea, vomiting, diarrhea.  Overnight on 3/25 went into a fib with RVR-- started on IV amiodarone and continues to have hgh HR   Clinical Impression   Pt agreeable to OT session.  Pt reports that he was independent with ADLS/IADLs, and driving prior to hospitalization. He reports that he lives alone but has assistance PRN from friends if needed. Pt completed bed mobility with HHA and increased time. He was able to complete sit to stand t/f with supervision and use of RW. He completed functional mobility around unit without increased fatigue and returned to recliner chair once in room. Pt has improved with activity tolerance and functional mobility compared to previous therapy sessions. Pt would benefit from continued OT services while in acute care setting.      Recommendations for follow up therapy are one component of a multi-disciplinary discharge planning process, led by the attending physician.  Recommendations may be updated based on patient status, additional functional criteria and insurance authorization.   Assistance Recommended at Discharge PRN  Patient can return home with the following A little help with bathing/dressing/bathroom;A little help with walking and/or transfers    Functional Status Assessment  Patient has had a recent decline in their functional status and demonstrates the ability to make significant improvements in function in a reasonable and  predictable amount of time.  Equipment Recommendations  None recommended by OT           Precautions / Restrictions Precautions Precautions: Fall Restrictions Weight Bearing Restrictions: No      Mobility Bed Mobility Overal bed mobility: Needs Assistance Bed Mobility: Supine to Sit     Supine to sit: Min guard     General bed mobility comments: requires increased time and assist to pull trunk fully upright    Transfers Overall transfer level: Needs assistance Equipment used: Rolling walker (2 wheels), 1 person hand held assist Transfers: Sit to/from Stand Sit to Stand: Min guard           General transfer comment: RW and HHA to transfer to standing      Balance Overall balance assessment: Mild deficits observed, not formally tested Sitting-balance support: No upper extremity supported Sitting balance-Leahy Scale: Fair Sitting balance - Comments: fair/fair unsteady due to pain   Standing balance support: No upper extremity supported Standing balance-Leahy Scale: Fair Standing balance comment: fair/fair due to increased pain                           ADL either performed or assessed with clinical judgement   ADL Overall ADL's : Needs assistance/impaired Eating/Feeding: Set up;Sitting   Grooming: Set up;Sitting   Upper Body Bathing: Set up;Sitting   Lower Body Bathing: Minimal assistance;Sitting/lateral leans   Upper Body Dressing : Minimal assistance;Sitting   Lower Body Dressing: Moderate assistance;Sitting/lateral leans   Toilet Transfer: Minimal assistance;Rolling walker (2 wheels)   Toileting- Clothing Manipulation and Hygiene: Minimal assistance   Tub/ Shower Transfer: Minimal assistance;Rolling  walker (2 wheels)   Functional mobility during ADLs: Supervision/safety;Rolling walker (2 wheels)       Vision Baseline Vision/History: 0 No visual deficits Ability to See in Adequate Light: 0 Adequate Patient Visual Report: No  change from baseline Vision Assessment?: No apparent visual deficits     Perception Perception Perception Tested?: No         Pertinent Vitals/Pain Pain Assessment Pain Assessment: Faces Faces Pain Scale: Hurts little more Pain Location: "belly" Pain Descriptors / Indicators: Aching, Sharp Pain Intervention(s): Limited activity within patient's tolerance, Monitored during session     Hand Dominance Right   Extremity/Trunk Assessment Upper Extremity Assessment Upper Extremity Assessment: Overall WFL for tasks assessed   Lower Extremity Assessment Lower Extremity Assessment: Defer to PT evaluation   Cervical / Trunk Assessment Cervical / Trunk Assessment: Normal   Communication Communication Communication: No difficulties   Cognition Arousal/Alertness: Awake/alert Behavior During Therapy: WFL for tasks assessed/performed Overall Cognitive Status: Within Functional Limits for tasks assessed                                                        Home Living Family/patient expects to be discharged to:: Private residence Living Arrangements: Alone Available Help at Discharge: Friend(s) Type of Home: Mobile home Home Access: Stairs to enter [employer] of Steps: 5-6   Home Layout: One level     Bathroom Shower/Tub: Teacher, early years/pre: Standard Bathroom Accessibility: Yes   Home Equipment: Conservation officer, nature (2 wheels);Cane - quad;BSC/3in1;Shower seat   Additional Comments: "I have everything"      Prior Functioning/Environment Prior Level of Function : Independent/Modified Independent             Mobility Comments: Reports independence with all ambulation and transfers without device ADLs Comments: independent with ADLs, shower chair for bathing        OT Problem List: Decreased strength;Decreased activity tolerance      OT Treatment/Interventions: Self-care/ADL training;Therapeutic exercise;Therapeutic  activities;Patient/family education    OT Goals(Current goals can be found in the care plan section) Acute Rehab OT Goals Patient Stated Goal: to return home OT Goal Formulation: With patient Time For Goal Achievement: 07/08/22 Potential to Achieve Goals: Good ADL Goals Pt Will Perform Upper Body Bathing: with set-up Pt Will Perform Lower Body Dressing: with supervision Pt Will Transfer to Toilet: with supervision  OT Frequency: Min 2X/week       AM-PAC OT "6 Clicks" Daily Activity     Outcome Measure Help from another person eating meals?: A Little Help from another person taking care of personal grooming?: None Help from another person toileting, which includes using toliet, bedpan, or urinal?: A Little Help from another person bathing (including washing, rinsing, drying)?: A Little Help from another person to put on and taking off regular upper body clothing?: None Help from another person to put on and taking off regular lower body clothing?: A Little 6 Click Score: 20   End of Session Equipment Utilized During Treatment: Rolling walker (2 wheels);Oxygen Nurse Communication: Mobility status  Activity Tolerance: Patient tolerated treatment well Patient left: in chair;with call bell/phone within reach  OT Visit Diagnosis: Muscle weakness (generalized) (M62.81);Unsteadiness on feet (R26.81)                Time: [id number] OT Time Calculation (min):  26 min Charges:  OT General Charges $OT Visit: 1 Visit OT Evaluation $OT Eval Low Complexity: [place], OTR/L 06/24/2022, 11:27 AM

## 2022-06-24 NOTE — Progress Notes (Signed)
Subjective: Patient has started having multiple bowel movements and passing flatus.  Tolerating clear liquid diet well.  Abdominal pain not significantly changed from yesterday.  Objective: Vital signs in last 24 hours: Temp:  [97.6 F (36.4 C)-99.1 F (37.3 C)] 97.6 F (36.4 C) (03/29 0800) Pulse Rate:  [70-87] 73 (03/29 0806) Resp:  [14-28] 25 (03/29 0806) BP: (108-152)/(48-119) 121/68 (03/29 0806) SpO2:  [88 %-96 %] 94 % (03/29 0806) Last BM Date : 06/18/22  Intake/Output from previous day: 03/28 0701 - 03/29 0700 In: 360 [P.O.:360] Out: 1970 [Urine:1970] Intake/Output this shift: No intake/output data recorded.  General appearance: alert, cooperative, and no distress GI: Soft without significant tenderness in the left lower quadrant.  No rigidity noted.  Bowel sounds present.  Lab Results:  Recent Labs    06/23/22 0452 06/24/22 0450  WBC 9.7 13.0*  HGB 14.3 14.4  HCT 44.5 44.2  PLT 227 268   BMET Recent Labs    06/23/22 0452 06/24/22 0450  NA 133* 133*  K 3.5 3.7  CL 97* 98  CO2 27 27  GLUCOSE 130* 137*  BUN 20 20  CREATININE 1.54* 1.61*  CALCIUM 7.8* 7.9*   PT/INR No results for input(s): "LABPROT", "INR" in the last 72 hours.  Studies/Results: CT ABDOMEN PELVIS W CONTRAST  Result Date: 06/22/2022 CLINICAL DATA:  Diverticulitis, complication suspected, sepsis, continued abdominal pain EXAM: CT ABDOMEN AND PELVIS WITH CONTRAST TECHNIQUE: Multidetector CT imaging of the abdomen and pelvis was performed using the standard protocol following bolus administration of intravenous contrast. RADIATION DOSE REDUCTION: This exam was performed according to the departmental dose-optimization program which includes automated exposure control, adjustment of the mA and/or kV according to patient size and/or use of iterative reconstruction technique. CONTRAST:  157mL OMNIPAQUE IOHEXOL 300 MG/ML SOLN additional oral enteric contrast COMPARISON:  06/19/2022 FINDINGS:  Lower chest: Trace bilateral pleural effusions and associated atelectasis or consolidation. Hepatobiliary: No solid liver abnormality is seen. Small gallstones. No gallbladder wall thickening, or biliary dilatation. Pancreas: Unremarkable. No pancreatic ductal dilatation or surrounding inflammatory changes. Spleen: Normal in size without significant abnormality. Adrenals/Urinary Tract: Adrenal glands are unremarkable. Simple, benign right renal cortical cysts, for which no further follow-up or characterization is required. Kidneys are otherwise normal, without renal calculi, solid lesion, or hydronephrosis. Bladder is unremarkable. Stomach/Bowel: Stomach is within normal limits. Appendix appears normal. Multiple thickened, matted appearing loops of mid small bowel in the ventral left hemiabdomen, appearance and configuration similar to prior examination (series 2, image 74). Descending and sigmoid diverticulosis. Similar appearance of mild wall thickening about the proximal to mid sigmoid with a small perforation arising from the anterior proximal sigmoid (series 2, image 84, 80). Vascular/Lymphatic: Aortic atherosclerosis. No enlarged abdominal or pelvic lymph nodes. Reproductive: No mass or other significant abnormality. Other: No abdominal wall hernia or abnormality. Increased volume of pneumoperitoneum compared to prior examination (series 2, image 60). Musculoskeletal: No acute or significant osseous findings. IMPRESSION: 1. Descending and sigmoid diverticulosis. Similar appearance of mild wall thickening about the proximal to mid sigmoid with a small perforation arising from the anterior proximal sigmoid consistent with diverticulitis complicated by perforation. No evidence of abscess at this time. 2. Increased volume of pneumoperitoneum compared to prior examination. 3. Multiple thickened, matted appearing loops of mid small bowel in the ventral left hemiabdomen, appearance and configuration similar to prior  examination. Findings are consistent with nonspecific infectious or inflammatory enteritis, generally of uncertain relation to diverticulitis described above. 4. Trace bilateral pleural effusions and associated  atelectasis or consolidation. 5. Cholelithiasis. These results will be called to the ordering clinician or representative by the Radiologist Assistant, and communication documented in the PACS or [employer]. Aortic Atherosclerosis (ICD10-I70.0). Electronically Signed   By: Delanna Ahmadi M.D.   On: 06/22/2022 11:56    Anti-infectives: Anti-infectives (From admission, onward)    Start     Dose/Rate Route Frequency Ordered Stop   06/19/22 0800  piperacillin-tazobactam (ZOSYN) IVPB 3.375 g        3.375 g 12.5 mL/hr over 240 Minutes Intravenous Every 8 hours 06/19/22 0624     06/19/22 0145  ceFEPIme (MAXIPIME) 2 g in sodium chloride 0.9 % 100 mL IVPB        2 g 200 mL/hr over 30 Minutes Intravenous  Once 06/19/22 0140 06/19/22 0226   06/19/22 0145  metroNIDAZOLE (FLAGYL) IVPB 500 mg        500 mg 100 mL/hr over 60 Minutes Intravenous  Once 06/19/22 0140 06/19/22 0255       Assessment/Plan: Impression: Sigmoid diverticulitis with perforation but no abscess cavity present.  White blood cell count has increased.  Will monitor this.  As he is tolerating clear liquid diet well, will advance to full liquid diet.  Will continue to monitor.  Seems to be making slow progress.  Still on amiodarone drip for rapid atrial fibrillation.  LOS: 5 days    [place] Signs 06/24/2022

## 2022-06-24 NOTE — Progress Notes (Signed)
PROGRESS NOTE    Bradley Torres  [id number] DOB: [birth year] DOA: 06/18/2022 PCP: Claretta Fraise, MD    Brief Narrative:  Bradley Torres is a 62 y.o. male with medical history significant of morbid obesity, NSTEMI, CKD 3, hypertension who presents to the emergency department due to abdominal pain which started this morning around 11 AM.  Pain was sharp and rated as 10/10 on pain scale, it was associated with nausea and aggravated with movement.  He denies fever, chills, chest pain, shortness of breath, nausea, vomiting, diarrhea.  Overnight on 3/25 went into a fib with RVR-- started on IV amiodarone and continues to have hgh HR    Assessment and Plan: Severe sepsis secondary to perforated sigmoid diverticulitis Patient met sepsis criteria due to being tachypneic and WBC> 12 (met SIRS criteria), source of infection was abdomen (met sepsis criteria), lactic acid was elevated at 4.0. Patient was seen by general surgery. CT was repeated on 3/27 which shows more free air.  General surgery does not think the patient needs urgent surgical intervention since he is otherwise stable with no change in exam findings. Patient seems to be stable for the most part.  Rise in WBC noted this morning although he has been afebrile.  Significance of this is not entirely clear.  Will recheck tomorrow. Patient remains on Zosyn.  Remains on clear liquid diet. General surgery continues to follow.   A fib with RVR Known history of atrial fibrillation.  Was not on any rate limiting medications at home.  Was on Xarelto which is currently on hold.  On full dose Lovenox. Cardiology is following.  Patient is on IV amiodarone.   Echocardiogram shows normal systolic function left atrial size was mildly dilated.  No significant valvular disease noted. TSH noted to be 6.5.  Free T4 is 1.01. Patient converted to sinus rhythm.  Cardiology continues to follow.  Remains on amiodarone infusion.  Lactic acidosis Resolved    Type 2 diabetes mellitus with uncontrolled hyperglycemia Monitor CBGs.  Continue SSI.  Hypokalemia Continue to supplement potassium.  Magnesium is 2.2.   Prolonged QT interval Monitor electrolytes and correct as indicated.   Acute kidney injury on CKD 3B (baseline creatinine at 1.4-1.5) Creatinine noted to be slightly higher today compared to yesterday.  He has good urine output.  He has on IV fluids.  Recheck labs tomorrow.  Avoid nephrotoxic agents.     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  On full dose Lovenox.     Essential hypertension Monitor blood pressures closely.   Mixed hyperlipidemia -Lipitor   Chronic diastolic heart failure -chronic and compensated -monitor while on IVF   OSA on CPAP -refuses to wear CPAP  Morbid obesity  Estimated body mass index is 51.52 kg/m as calculated from the following:   Height as of this encounter: 6' (1.829 m).   Weight as of this encounter: 172.3 kg.   DVT prophylaxis: SCDs Start: 06/19/22 0600 CODE STATUS: Full code Family communication: Discussed with patient. Disposition Plan: Continue to mobilize.  Hopefully return home when improved.  Status is: Inpatient Remains inpatient appropriate because: Perforated diverticulitis, atrial fibrillation with RVR    Consultants:  General surgery Cardiology   Subjective: Patient feels about the same as yesterday.  No worsening in abdominal pain.  Passing some gas now.  No nausea or vomiting.  Tolerating his clear liquids.  Objective: Vitals:   06/24/22 0500 06/24/22 0700 06/24/22 0800  06/24/22 0806  BP: (!) 152/67   121/68  Pulse: 84 70  73  Resp: 19 14  (!) 25  Temp:   97.6 F (36.4 C)   TempSrc:   Oral   SpO2: 93% (!) 88% 95% 94%  Weight:      Height:        Intake/Output Summary (Last 24 hours) at 06/24/2022 0908 Last data filed at 06/23/2022 2200 Gross per 24 hour  Intake --  Output 1970 ml  Net  -1970 ml    Filed Weights   06/18/22 2051 06/22/22 0516  Weight: (!) 167.8 kg (!) 172.3 kg    Examination:  General appearance: Awake alert.  In no distress Resp: Clear to auscultation bilaterally.  Normal effort Cardio: S1-S2 is normal regular.  No S3-S4.  No rubs murmurs or bruit GI: Abdomen is soft.  Tender in the lower abdomen.  Bowel sounds present.  No masses organomegaly. Extremities: No edema.     Data Reviewed:   CBC: Recent Labs  Lab 06/20/22 0409 06/21/22 0458 06/22/22 0423 06/23/22 0452 06/24/22 0450  WBC 15.8* 14.2* 11.6* 9.7 13.0*  HGB 14.4 14.0 14.3 14.3 14.4  HCT 45.1 43.8 44.1 44.5 44.2  MCV 97.4 97.1 95.9 95.1 95.1  PLT 221 202 208 227 [id number]    Basic Metabolic Panel: Recent Labs  Lab 06/19/22 0630 06/20/22 0409 06/20/22 0818 06/21/22 0458 06/22/22 0423 06/22/22 0509 06/23/22 0452 06/24/22 0450  NA  --  134*  --  132* 132*  --  133* 133*  K  --  3.7  --  3.6 3.3*  --  3.5 3.7  CL  --  97*  --  98 97*  --  97* 98  CO2  --  27  --  24 27  --  27 27  GLUCOSE  --  120*  --  141* 137*  --  130* 137*  BUN  --  19  --  19 18  --  20 20  CREATININE  --  1.47*  --  1.54* 1.39*  --  1.54* 1.61*  CALCIUM  --  8.3*  --  7.9* 7.7*  --  7.8* 7.9*  MG 1.6*  --  1.9  --   --  2.1 2.2  --   PHOS 3.4  --   --   --   --   --   --   --     GFR: Estimated Creatinine Clearance: 79.7 mL/min (A) (by C-G formula based on SCr of 1.61 mg/dL (H)).  Liver Function Tests: Recent Labs  Lab 06/19/22 0009 06/20/22 0409  AST 23 13*  ALT 22 15  ALKPHOS 56 50  BILITOT 1.2 1.2  PROT 7.1 6.3*  ALBUMIN 3.4* 2.9*    Recent Labs  Lab 06/19/22 0009  LIPASE 28     CBG: Recent Labs  Lab 06/23/22 1808 06/23/22 2028 06/23/22 2339 06/24/22 0403 06/24/22 0748  GLUCAP 152* 181* 142* 143* 129*     Thyroid Function Tests: Recent Labs    06/23/22 0452  FREET4 1.01     Sepsis Labs: Recent Labs  Lab 06/19/22 0153 06/19/22 0447 06/19/22 0630   LATICACIDVEN 4.0* 1.7 2.1*     Recent Results (from the past 240 hour(s))  Culture, blood (single)     Status: None (Preliminary result)   Collection Time: 06/19/22  1:53 AM   Specimen: BLOOD LEFT ARM  Result Value Ref Range Status   Specimen Description BLOOD LEFT  ARM  Final   Special Requests   Final    BOTTLES DRAWN AEROBIC AND ANAEROBIC Blood Culture adequate volume   Culture   Final    NO GROWTH 4 DAYS Performed at [facility], [address] [place], [place] [address]    Report Status PENDING  Incomplete  MRSA Next Gen by PCR, Nasal     Status: None   Collection Time: 06/20/22 10:26 PM   Specimen: Nasal Mucosa; Nasal Swab  Result Value Ref Range Status   MRSA by PCR Next Gen NOT DETECTED NOT DETECTED Final    Comment: (NOTE) The GeneXpert MRSA Assay (FDA approved for NASAL specimens only), is one component of a comprehensive MRSA colonization surveillance program. It is not intended to diagnose MRSA infection nor to guide or monitor treatment for MRSA infections. Test performance is not FDA approved in patients less than 71 years old. Performed at [facility], [address], [place], [place] [address]          Radiology Studies: CT ABDOMEN PELVIS W CONTRAST  Result Date: 06/22/2022 CLINICAL DATA:  Diverticulitis, complication suspected, sepsis, continued abdominal pain EXAM: CT ABDOMEN AND PELVIS WITH CONTRAST TECHNIQUE: Multidetector CT imaging of the abdomen and pelvis was performed using the standard protocol following bolus administration of intravenous contrast. RADIATION DOSE REDUCTION: This exam was performed according to the departmental dose-optimization program which includes automated exposure control, adjustment of the mA and/or kV according to patient size and/or use of iterative reconstruction technique. CONTRAST:  160mL OMNIPAQUE IOHEXOL 300 MG/ML SOLN additional oral enteric contrast COMPARISON:  06/19/2022 FINDINGS: Lower chest: Trace bilateral  pleural effusions and associated atelectasis or consolidation. Hepatobiliary: No solid liver abnormality is seen. Small gallstones. No gallbladder wall thickening, or biliary dilatation. Pancreas: Unremarkable. No pancreatic ductal dilatation or surrounding inflammatory changes. Spleen: Normal in size without significant abnormality. Adrenals/Urinary Tract: Adrenal glands are unremarkable. Simple, benign right renal cortical cysts, for which no further follow-up or characterization is required. Kidneys are otherwise normal, without renal calculi, solid lesion, or hydronephrosis. Bladder is unremarkable. Stomach/Bowel: Stomach is within normal limits. Appendix appears normal. Multiple thickened, matted appearing loops of mid small bowel in the ventral left hemiabdomen, appearance and configuration similar to prior examination (series 2, image 74). Descending and sigmoid diverticulosis. Similar appearance of mild wall thickening about the proximal to mid sigmoid with a small perforation arising from the anterior proximal sigmoid (series 2, image 84, 80). Vascular/Lymphatic: Aortic atherosclerosis. No enlarged abdominal or pelvic lymph nodes. Reproductive: No mass or other significant abnormality. Other: No abdominal wall hernia or abnormality. Increased volume of pneumoperitoneum compared to prior examination (series 2, image 60). Musculoskeletal: No acute or significant osseous findings. IMPRESSION: 1. Descending and sigmoid diverticulosis. Similar appearance of mild wall thickening about the proximal to mid sigmoid with a small perforation arising from the anterior proximal sigmoid consistent with diverticulitis complicated by perforation. No evidence of abscess at this time. 2. Increased volume of pneumoperitoneum compared to prior examination. 3. Multiple thickened, matted appearing loops of mid small bowel in the ventral left hemiabdomen, appearance and configuration similar to prior examination. Findings are  consistent with nonspecific infectious or inflammatory enteritis, generally of uncertain relation to diverticulitis described above. 4. Trace bilateral pleural effusions and associated atelectasis or consolidation. 5. Cholelithiasis. These results will be called to the ordering clinician or representative by the Radiologist Assistant, and communication documented in the PACS or [employer]. Aortic Atherosclerosis (ICD10-I70.0). Electronically Signed   By: Jamse Mead.D.  On: 06/22/2022 11:56        Scheduled Meds:  atorvastatin  20 mg Oral Daily   Chlorhexidine Gluconate Cloth  6 each Topical Daily   docusate sodium  100 mg Oral BID   enoxaparin (LOVENOX) injection  150 mg Subcutaneous Q12H   insulin aspart  0-9 Units Subcutaneous Q4H   Continuous Infusions:  sodium chloride Stopped (06/23/22 0532)   amiodarone 30 mg/hr (06/23/22 2104)   piperacillin-tazobactam (ZOSYN)  IV 3.375 g (06/24/22 0527)     LOS: 5 days     Bonnielee Haff,  [facility]  06/24/2022, 9:08 AM

## 2022-06-25 DIAGNOSIS — I480 Paroxysmal atrial fibrillation: Secondary | ICD-10-CM | POA: Diagnosis not present

## 2022-06-25 DIAGNOSIS — K5720 Diverticulitis of large intestine with perforation and abscess without bleeding: Secondary | ICD-10-CM | POA: Diagnosis not present

## 2022-06-25 LAB — BASIC METABOLIC PANEL
Anion gap: 8 (ref 5–15)
BUN: 19 mg/dL (ref 6–20)
CO2: 27 mmol/L (ref 22–32)
Calcium: 8 mg/dL — ABNORMAL LOW (ref 8.9–10.3)
Chloride: 98 mmol/L (ref 98–111)
Creatinine, Ser: 1.57 mg/dL — ABNORMAL HIGH (ref 0.61–1.24)
GFR, Estimated: 50 mL/min — ABNORMAL LOW (ref >60)
Glucose, Bld: 145 mg/dL — ABNORMAL HIGH (ref 70–99)
Potassium: 3.7 mmol/L (ref 3.5–5.1)
Sodium: 133 mmol/L — ABNORMAL LOW (ref 135–145)

## 2022-06-25 LAB — GLUCOSE, CAPILLARY
Glucose-Capillary: 158 mg/dL — ABNORMAL HIGH (ref 70–99)
Glucose-Capillary: 135 mg/dL — ABNORMAL HIGH (ref 70–99)
Glucose-Capillary: 151 mg/dL — ABNORMAL HIGH (ref 70–99)
Glucose-Capillary: 113 mg/dL — ABNORMAL HIGH (ref 70–99)
Glucose-Capillary: 189 mg/dL — ABNORMAL HIGH (ref 70–99)

## 2022-06-25 LAB — CBC
HCT: 42.2 % (ref 39.0–52.0)
Hemoglobin: 13.8 g/dL (ref 13.0–17.0)
MCH: 30.8 pg (ref 26.0–34.0)
MCHC: 32.7 g/dL (ref 30.0–36.0)
MCV: 94.2 fL (ref 80.0–100.0)
Platelets: 266 10*3/uL (ref 150–400)
RBC: 4.48 MIL/uL (ref 4.22–5.81)
RDW: 15.3 % (ref 11.5–15.5)
WBC: 12.5 10*3/uL — ABNORMAL HIGH (ref 4.0–10.5)
nRBC: 0 % (ref 0.0–0.2)

## 2022-06-25 MED ORDER — POTASSIUM CHLORIDE CRYS ER 20 MEQ PO TBCR
40.0000 meq | EXTENDED_RELEASE_TABLET | Freq: Once | ORAL | Status: AC
  Administered 2022-06-25: 40 meq via ORAL
  Filled 2022-06-25: qty 2

## 2022-06-25 NOTE — Progress Notes (Signed)
  Subjective: Patient tolerating full liquid diet well.  Is passing flatus.  Has not had a significant bowel movement yet.  Having minimal abdominal pain.  Objective: Vital signs in last 24 hours: Temp:  [97.4 F (36.3 C)-98.2 F (36.8 C)] 97.4 F (36.3 C) (03/30 0732) Pulse Rate:  [65-92] 86 (03/30 0100) Resp:  [16-28] 18 (03/30 0100) BP: (123-137)/(58-84) 123/84 (03/29 2329) SpO2:  [87 %-100 %] 95 % (03/30 0100) Weight:  [173.4 kg] 173.4 kg (03/30 0529) Last BM Date : 06/18/22  Intake/Output from previous day: 03/29 0701 - 03/30 0700 In: 1452.6 [P.O.:650; I.V.:512.5; IV Piggyback:290.1] Out: 1700 [Urine:1700] Intake/Output this shift: Total I/O In: 240 [P.O.:240] Out: -   General appearance: alert, cooperative, and no distress GI: Soft with minimal left-sided abdominal pain.  No rigidity is noted.  Bowel sounds are present.  Lab Results:  Recent Labs    06/24/22 0450 06/25/22 0455  WBC 13.0* 12.5*  HGB 14.4 13.8  HCT 44.2 42.2  PLT 268 266   BMET Recent Labs    06/24/22 0450 06/25/22 0455  NA 133* 133*  K 3.7 3.7  CL 98 98  CO2 27 27  GLUCOSE 137* 145*  BUN 20 19  CREATININE 1.61* 1.57*  CALCIUM 7.9* 8.0*   PT/INR No results for input(s): "LABPROT", "INR" in the last 72 hours.  Studies/Results: No results found.  Anti-infectives: Anti-infectives (From admission, onward)    Start     Dose/Rate Route Frequency Ordered Stop   06/19/22 0800  piperacillin-tazobactam (ZOSYN) IVPB 3.375 g        3.375 g 12.5 mL/hr over 240 Minutes Intravenous Every 8 hours 06/19/22 0624     06/19/22 0145  ceFEPIme (MAXIPIME) 2 g in sodium chloride 0.9 % 100 mL IVPB        2 g 200 mL/hr over 30 Minutes Intravenous  Once 06/19/22 0140 06/19/22 0226   06/19/22 0145  metroNIDAZOLE (FLAGYL) IVPB 500 mg        500 mg 100 mL/hr over 60 Minutes Intravenous  Once 06/19/22 0140 06/19/22 0255       Assessment/Plan: Impression: Perforated sigmoid diverticulitis.  White  blood cell count stable.  Patient's overall exam is slowly improving.  Okay for transfer to regular floor from my standpoint.  Continue full liquid diet and IV antibiotic.  No need for acute surgical intervention.  LOS: 6 days    Aviva Signs 06/25/2022

## 2022-06-25 NOTE — Progress Notes (Signed)
PROGRESS NOTE    Bradley Torres  [id number] DOB: 06/10/1960 DOA: 06/18/2022 PCP: Claretta Fraise, MD    Brief Narrative:  Bradley Torres is a 62 y.o. male with medical history significant of morbid obesity, NSTEMI, CKD 3, hypertension who presents to the emergency department due to abdominal pain which started this morning around 11 AM.  Pain was sharp and rated as 10/10 on pain scale, it was associated with nausea and aggravated with movement.  He denies fever, chills, chest pain, shortness of breath, nausea, vomiting, diarrhea.  Overnight on 3/25 went into a fib with RVR-- started on IV amiodarone and continues to have hgh HR    Assessment and Plan:  Severe sepsis secondary to perforated sigmoid diverticulitis Patient met sepsis criteria due to being tachypneic and WBC> 12 (met SIRS criteria), source of infection was abdomen (met sepsis criteria), lactic acid was elevated at 4.0. Patient was seen by general surgery. CT was repeated on 3/27 which shows more free air.  General surgery does not think the patient needs urgent surgical intervention since he was otherwise stable with no change in exam findings. WBC is elevated but stable.  He is afebrile.  Tolerating his liquid diet.  Has been passing gas. Patient remains on Zosyn.  General surgery continues to follow.   A fib with RVR Known history of atrial fibrillation.  Was not on any rate limiting medications at home.  Was on Xarelto which is currently on hold.  On full dose Lovenox. Patient was seen by cardiology.  Placed on IV amiodarone.  Converted to sinus rhythm.  Amiodarone has been changed over to oral. Continue to monitor on telemetry. Echocardiogram shows normal systolic function left atrial size was mildly dilated.  No significant valvular disease noted. TSH noted to be 6.5.  Free T4 is 1.01.  Lactic acidosis Resolved   Type 2 diabetes mellitus with uncontrolled hyperglycemia Monitor CBGs.  Continue  SSI.  Hypokalemia/hyponatremia Continue to supplement potassium.  Magnesium is 2.2.  Sodium level is stable.   Prolonged QT interval Monitor electrolytes and correct as indicated.   Acute kidney injury on CKD 3B (baseline creatinine at 1.4-1.5) Creatinine stable for the most part.  He has good urine output.  Continue to monitor urine output and avoid nephrotoxic agents.    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  On full dose Lovenox.     Essential hypertension Blood pressures are reasonably well-controlled.  Mixed hyperlipidemia -Lipitor   Chronic diastolic heart failure Compensated.   OSA on CPAP Refuses to wear CPAP  Morbid obesity  Estimated body mass index is 51.85 kg/m as calculated from the following:   Height as of this encounter: 6' (1.829 m).   Weight as of this encounter: 173.4 kg.   DVT prophylaxis: SCDs Start: 06/19/22 0600 CODE STATUS: Full code Family communication: Discussed with patient. Disposition Plan: Continue to mobilize.  Hopefully return home when improved.  Transfer to telemetry floor later today if okay with general surgery.  Status is: Inpatient Remains inpatient appropriate because: Perforated diverticulitis, atrial fibrillation with RVR    Consultants:  General surgery Cardiology   Subjective: Patient feels well.  No complaints offered.  Passing gas.  No worsening of abdominal pain.  Tolerating his liquid diet.  Objective: Vitals:   06/25/22 0000 06/25/22 0100 06/25/22 0529 06/25/22 0732  BP:      Pulse: 78 86    Resp: 16 18  Temp:   (!) 97.4 F (36.3 C) (!) 97.4 F (36.3 C)  TempSrc:   Oral Oral  SpO2: (!) 87% 95%    Weight:   (!) 173.4 kg   Height:        Intake/Output Summary (Last 24 hours) at 06/25/2022 0945 Last data filed at 06/25/2022 0600 Gross per 24 hour  Intake 1452.63 ml  Output 1700 ml  Net -247.37 ml    Filed Weights   06/18/22 2051  06/22/22 0516 06/25/22 0529  Weight: (!) 167.8 kg (!) 172.3 kg (!) 173.4 kg    Examination:  General appearance: Awake alert.  In no distress Resp: Clear to auscultation bilaterally.  Normal effort Cardio: S1-S2 is normal regular.  No S3-S4.  No rubs murmurs or bruit GI: Bowel is heard.  Palpation deferred to general surgery. Extremities: No edema.  Physical deconditioning is noted. Neurologic: Alert and oriented x3.  No focal neurological deficits.     Data Reviewed:   CBC: Recent Labs  Lab 06/21/22 0458 06/22/22 0423 06/23/22 0452 06/24/22 0450 06/25/22 0455  WBC 14.2* 11.6* 9.7 13.0* 12.5*  HGB 14.0 14.3 14.3 14.4 13.8  HCT 43.8 44.1 44.5 44.2 42.2  MCV 97.1 95.9 95.1 95.1 94.2  PLT 202 208 227 268 [id number]    Basic Metabolic Panel: Recent Labs  Lab 06/19/22 0630 06/20/22 0409 06/20/22 0818 06/21/22 0458 06/22/22 0423 06/22/22 0509 06/23/22 0452 06/24/22 0450 06/25/22 0455  NA  --    < >  --  132* 132*  --  133* 133* 133*  K  --    < >  --  3.6 3.3*  --  3.5 3.7 3.7  CL  --    < >  --  98 97*  --  97* 98 98  CO2  --    < >  --  24 27  --  27 27 27   GLUCOSE  --    < >  --  141* 137*  --  130* 137* 145*  BUN  --    < >  --  19 18  --  20 20 19   CREATININE  --    < >  --  1.54* 1.39*  --  1.54* 1.61* 1.57*  CALCIUM  --    < >  --  7.9* 7.7*  --  7.8* 7.9* 8.0*  MG 1.6*  --  1.9  --   --  2.1 2.2  --   --   PHOS 3.4  --   --   --   --   --   --   --   --    < > = values in this interval not displayed.    GFR: Estimated Creatinine Clearance: 82 mL/min (A) (by C-G formula based on SCr of 1.57 mg/dL (H)).  Liver Function Tests: Recent Labs  Lab 06/19/22 0009 06/20/22 0409  AST 23 13*  ALT 22 15  ALKPHOS 56 50  BILITOT 1.2 1.2  PROT 7.1 6.3*  ALBUMIN 3.4* 2.9*    Recent Labs  Lab 06/19/22 0009  LIPASE 28     CBG: Recent Labs  Lab 06/24/22 1930 06/24/22 1943 06/24/22 2322 06/25/22 0508 06/25/22 0729  GLUCAP 169* 175* 203* 158* 135*      Thyroid Function Tests: Recent Labs    06/23/22 0452  FREET4 1.01     Sepsis Labs: Recent Labs  Lab 06/19/22 0153 06/19/22 0447 06/19/22 0630  LATICACIDVEN 4.0* 1.7 2.1*  Recent Results (from the past 240 hour(s))  Culture, blood (single)     Status: None   Collection Time: 06/19/22  1:53 AM   Specimen: BLOOD LEFT ARM  Result Value Ref Range Status   Specimen Description BLOOD LEFT ARM  Final   Special Requests   Final    BOTTLES DRAWN AEROBIC AND ANAEROBIC Blood Culture adequate volume   Culture   Final    NO GROWTH 5 DAYS Performed at [facility], [address] [place], [place] [address]    Report Status 06/24/2022 FINAL  Final  MRSA Next Gen by PCR, Nasal     Status: None   Collection Time: 06/20/22 10:26 PM   Specimen: Nasal Mucosa; Nasal Swab  Result Value Ref Range Status   MRSA by PCR Next Gen NOT DETECTED NOT DETECTED Final    Comment: (NOTE) The GeneXpert MRSA Assay (FDA approved for NASAL specimens only), is one component of a comprehensive MRSA colonization surveillance program. It is not intended to diagnose MRSA infection nor to guide or monitor treatment for MRSA infections. Test performance is not FDA approved in patients less than 75 years old. Performed at [facility], [address] [place], [place] [address]          Radiology Studies: No results found.      Scheduled Meds:  amiodarone  200 mg Oral BID   atorvastatin  20 mg Oral Daily   Chlorhexidine Gluconate Cloth  6 each Topical Daily   docusate sodium  100 mg Oral BID   enoxaparin (LOVENOX) injection  150 mg Subcutaneous Q12H   feeding supplement  1 Container Oral TID BM   insulin aspart  0-9 Units Subcutaneous Q4H   senna-docusate  2 tablet Oral QHS   Continuous Infusions:  sodium chloride Stopped (06/23/22 0532)   piperacillin-tazobactam (ZOSYN)  IV 3.375 g (06/25/22 0518)     LOS: 6 days     Bonnielee Haff,  [facility]  06/25/2022, 9:45 AM

## 2022-06-26 DIAGNOSIS — K5720 Diverticulitis of large intestine with perforation and abscess without bleeding: Secondary | ICD-10-CM | POA: Diagnosis not present

## 2022-06-26 DIAGNOSIS — I480 Paroxysmal atrial fibrillation: Secondary | ICD-10-CM | POA: Diagnosis not present

## 2022-06-26 LAB — GLUCOSE, CAPILLARY
Glucose-Capillary: 106 mg/dL — ABNORMAL HIGH (ref 70–99)
Glucose-Capillary: 136 mg/dL — ABNORMAL HIGH (ref 70–99)
Glucose-Capillary: 138 mg/dL — ABNORMAL HIGH (ref 70–99)
Glucose-Capillary: 140 mg/dL — ABNORMAL HIGH (ref 70–99)
Glucose-Capillary: 159 mg/dL — ABNORMAL HIGH (ref 70–99)
Glucose-Capillary: 159 mg/dL — ABNORMAL HIGH (ref 70–99)
Glucose-Capillary: 179 mg/dL — ABNORMAL HIGH (ref 70–99)

## 2022-06-26 MED ORDER — POLYETHYLENE GLYCOL 3350 17 G PO PACK
17.0000 g | PACK | Freq: Two times a day (BID) | ORAL | Status: DC
  Administered 2022-06-26 – 2022-06-27 (×2): 17 g via ORAL
  Filled 2022-06-26 (×5): qty 1

## 2022-06-26 NOTE — Progress Notes (Signed)
PROGRESS NOTE    Bradley Torres  [id number] DOB: 03-04-1961 DOA: 06/18/2022 PCP: Claretta Fraise, MD    Brief Narrative:  Bradley Torres is a 62 y.o. male with medical history significant of morbid obesity, NSTEMI, CKD 3, hypertension who presents to the emergency department due to abdominal pain which started this morning around 11 AM.  Pain was sharp and rated as 10/10 on pain scale, it was associated with nausea and aggravated with movement.  He denies fever, chills, chest pain, shortness of breath, nausea, vomiting, diarrhea.  Overnight on 3/25 went into a fib with RVR-- started on IV amiodarone and continues to have hgh HR    Assessment and Plan:  Severe sepsis secondary to perforated sigmoid diverticulitis Patient met sepsis criteria due to being tachypneic and WBC> 12 (met SIRS criteria), source of infection was abdomen (met sepsis criteria), lactic acid was elevated at 4.0. Patient was seen by general surgery. CT was repeated on 3/27 which shows more free air.  General surgery does not think the patient needs urgent surgical intervention since he was otherwise stable with no change in exam findings. WBC has been elevated but patient has been afebrile.  No labs checked today.  Will check labs tomorrow. He continues to be stable.  Wait for surgery input. Remains on Zosyn.   A fib with RVR Known history of atrial fibrillation.  Was not on any rate limiting medications at home.  Was on Xarelto which is currently on hold.  On full dose Lovenox. Patient was seen by cardiology.  Placed on IV amiodarone.  Converted to sinus rhythm.  Amiodarone has been changed over to oral. Echocardiogram shows normal systolic function left atrial size was mildly dilated.  No significant valvular disease noted. TSH noted to be 6.5.  Free T4 is 1.01. Continue to monitor on telemetry.  Lactic acidosis Resolved   Type 2 diabetes mellitus with uncontrolled hyperglycemia Monitor CBGs.  Continue  SSI.  Hypokalemia/hyponatremia Potassium was supplemented.  Continue to check electrolytes periodically.   Prolonged QT interval Monitor electrolytes and correct as indicated.   Acute kidney injury on CKD 3B (baseline creatinine at 1.4-1.5) Creatinine stable for the most part.  He has good urine output.  Continue to monitor urine output and avoid nephrotoxic agents.    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  On full dose Lovenox.     Essential hypertension Blood pressures are reasonably well-controlled.  Mixed hyperlipidemia Lipitor   Chronic diastolic heart failure Compensated.   OSA on CPAP Refuses to wear CPAP  Morbid obesity  Estimated body mass index is 51.85 kg/m as calculated from the following:   Height as of this encounter: 6' (1.829 m).   Weight as of this encounter: 173.4 kg.   DVT prophylaxis: SCDs Start: 06/19/22 0600 CODE STATUS: Full code Family communication: Discussed with patient. Disposition Plan: Continue to mobilize.  Hopefully return home when improved.    Status is: Inpatient Remains inpatient appropriate because: Perforated diverticulitis, atrial fibrillation with RVR    Consultants:  General surgery Cardiology   Subjective: Patient feels well.  Denies any worsening of his abdominal symptoms.  Passing gas.  No bowel movements.  Objective: Vitals:   06/25/22 2025 06/26/22 0000 06/26/22 0315 06/26/22 0755  BP: (!) 147/89 (!) 160/89 (!) 158/80   Pulse: 72 75 69   Resp: 17 17 18    Temp:    (!) 97.5 F (36.4 C)  TempSrc:  Axillary  SpO2: 100% 98% 96%   Weight:      Height:        Intake/Output Summary (Last 24 hours) at 06/26/2022 0935 Last data filed at 06/26/2022 0840 Gross per 24 hour  Intake 1113.34 ml  Output 3350 ml  Net -2236.66 ml    Filed Weights   06/18/22 2051 06/22/22 0516 06/25/22 0529  Weight: (!) 167.8 kg (!) 172.3 kg (!) 173.4 kg     Examination:  General appearance: Awake alert.  In no distress Resp: Clear to auscultation bilaterally.  Normal effort Cardio: S1-S2 is normal regular.  No S3-S4.  No rubs murmurs or bruit GI: Bowel also heard.  Palpation deferred to general surgery. Extremities: No edema.  Full range of motion of lower extremities. Neurologic: Alert and oriented x3.  No focal neurological deficits.     Data Reviewed:   CBC: Recent Labs  Lab 06/21/22 0458 06/22/22 0423 06/23/22 0452 06/24/22 0450 06/25/22 0455  WBC 14.2* 11.6* 9.7 13.0* 12.5*  HGB 14.0 14.3 14.3 14.4 13.8  HCT 43.8 44.1 44.5 44.2 42.2  MCV 97.1 95.9 95.1 95.1 94.2  PLT 202 208 227 268 [id number]    Basic Metabolic Panel: Recent Labs  Lab 06/20/22 0818 06/21/22 0458 06/22/22 0423 06/22/22 0509 06/23/22 0452 06/24/22 0450 06/25/22 0455  NA  --  132* 132*  --  133* 133* 133*  K  --  3.6 3.3*  --  3.5 3.7 3.7  CL  --  98 97*  --  97* 98 98  CO2  --  24 27  --  27 27 27   GLUCOSE  --  141* 137*  --  130* 137* 145*  BUN  --  19 18  --  20 20 19   CREATININE  --  1.54* 1.39*  --  1.54* 1.61* 1.57*  CALCIUM  --  7.9* 7.7*  --  7.8* 7.9* 8.0*  MG 1.9  --   --  2.1 2.2  --   --     GFR: Estimated Creatinine Clearance: 82 mL/min (A) (by C-G formula based on SCr of 1.57 mg/dL (H)).  Liver Function Tests: Recent Labs  Lab 06/20/22 0409  AST 13*  ALT 15  ALKPHOS 50  BILITOT 1.2  PROT 6.3*  ALBUMIN 2.9*     CBG: Recent Labs  Lab 06/25/22 1652 06/25/22 2011 06/26/22 0009 06/26/22 0414 06/26/22 0901  GLUCAP 113* 189* 106* 136* 159*      Recent Results (from the past 240 hour(s))  Culture, blood (single)     Status: None   Collection Time: 06/19/22  1:53 AM   Specimen: BLOOD LEFT ARM  Result Value Ref Range Status   Specimen Description BLOOD LEFT ARM  Final   Special Requests   Final    BOTTLES DRAWN AEROBIC AND ANAEROBIC Blood Culture adequate volume   Culture   Final    NO GROWTH 5 DAYS Performed  at [facility], [address], [place], [place] [address]    Report Status 06/24/2022 FINAL  Final  MRSA Next Gen by PCR, Nasal     Status: None   Collection Time: 06/20/22 10:26 PM   Specimen: Nasal Mucosa; Nasal Swab  Result Value Ref Range Status   MRSA by PCR Next Gen NOT DETECTED NOT DETECTED Final    Comment: (NOTE) The GeneXpert MRSA Assay (FDA approved for NASAL specimens only), is one component of a comprehensive MRSA colonization surveillance program. It is not intended to diagnose  MRSA infection nor to guide or monitor treatment for MRSA infections. Test performance is not FDA approved in patients less than 48 years old. Performed at [facility], [address], [place], [place] [address]          Radiology Studies: No results found.      Scheduled Meds:  amiodarone  200 mg Oral BID   atorvastatin  20 mg Oral Daily   Chlorhexidine Gluconate Cloth  6 each Topical Daily   docusate sodium  100 mg Oral BID   enoxaparin (LOVENOX) injection  150 mg Subcutaneous Q12H   feeding supplement  1 Container Oral TID BM   insulin aspart  0-9 Units Subcutaneous Q4H   senna-docusate  2 tablet Oral QHS   Continuous Infusions:  sodium chloride Stopped (06/23/22 0532)   piperacillin-tazobactam (ZOSYN)  IV 3.375 g (06/26/22 0504)     LOS: 7 days     Bonnielee Haff,  [facility]  06/26/2022, 9:35 AM

## 2022-06-26 NOTE — Progress Notes (Signed)
  Subjective: Patient tolerating full liquid diet well.  Is passing gas but has not had a bowel movement.  Intermittent left lower quadrant abdominal pain which occurs in spasms.  No other abdominal pain noted.  Objective: Vital signs in last 24 hours: Temp:  [97.4 F (36.3 C)-97.7 F (36.5 C)] 97.5 F (36.4 C) (03/31 0755) Pulse Rate:  [46-75] 69 (03/31 0315) Resp:  [12-20] 18 (03/31 0315) BP: (128-160)/(80-113) 158/80 (03/31 0315) SpO2:  [94 %-100 %] 96 % (03/31 0315) Last BM Date : 06/18/22  Intake/Output from previous day: 03/30 0701 - 03/31 0700 In: 873.3 [P.O.:720; IV Piggyback:153.3] Out: 2950 [Urine:2950] Intake/Output this shift: Total I/O In: 240 [P.O.:240] Out: 400 [Urine:400]  General appearance: alert, cooperative, and no distress GI: soft, non-tender; bowel sounds normal; no masses,  no organomegaly  Lab Results:  Recent Labs    06/24/22 0450 06/25/22 0455  WBC 13.0* 12.5*  HGB 14.4 13.8  HCT 44.2 42.2  PLT 268 266   BMET Recent Labs    06/24/22 0450 06/25/22 0455  NA 133* 133*  K 3.7 3.7  CL 98 98  CO2 27 27  GLUCOSE 137* 145*  BUN 20 19  CREATININE 1.61* 1.57*  CALCIUM 7.9* 8.0*   PT/INR No results for input(s): "LABPROT", "INR" in the last 72 hours.  Studies/Results: No results found.  Anti-infectives: Anti-infectives (From admission, onward)    Start     Dose/Rate Route Frequency Ordered Stop   06/19/22 0800  piperacillin-tazobactam (ZOSYN) IVPB 3.375 g        3.375 g 12.5 mL/hr over 240 Minutes Intravenous Every 8 hours 06/19/22 0624     06/19/22 0145  ceFEPIme (MAXIPIME) 2 g in sodium chloride 0.9 % 100 mL IVPB        2 g 200 mL/hr over 30 Minutes Intravenous  Once 06/19/22 0140 06/19/22 0226   06/19/22 0145  metroNIDAZOLE (FLAGYL) IVPB 500 mg        500 mg 100 mL/hr over 60 Minutes Intravenous  Once 06/19/22 0140 06/19/22 0255       Assessment/Plan: Impression: Perforated sigmoid diverticulitis without abscess  formation.  White blood cell count today.  Has been afebrile.  Abdominal examination benign. Plan: Will add MiraLAX to daily regimen.  No need for acute surgical intervention.  LOS: 7 days    Aviva Signs 06/26/2022

## 2022-06-27 DIAGNOSIS — K5732 Diverticulitis of large intestine without perforation or abscess without bleeding: Secondary | ICD-10-CM

## 2022-06-27 DIAGNOSIS — I4891 Unspecified atrial fibrillation: Secondary | ICD-10-CM | POA: Diagnosis not present

## 2022-06-27 DIAGNOSIS — K5720 Diverticulitis of large intestine with perforation and abscess without bleeding: Secondary | ICD-10-CM | POA: Diagnosis not present

## 2022-06-27 DIAGNOSIS — I480 Paroxysmal atrial fibrillation: Secondary | ICD-10-CM | POA: Diagnosis not present

## 2022-06-27 DIAGNOSIS — Z86711 Personal history of pulmonary embolism: Secondary | ICD-10-CM | POA: Diagnosis not present

## 2022-06-27 LAB — BASIC METABOLIC PANEL
Anion gap: 11 (ref 5–15)
BUN: 16 mg/dL (ref 6–20)
CO2: 28 mmol/L (ref 22–32)
Calcium: 9 mg/dL (ref 8.9–10.3)
Chloride: 96 mmol/L — ABNORMAL LOW (ref 98–111)
Creatinine, Ser: 1.51 mg/dL — ABNORMAL HIGH (ref 0.61–1.24)
GFR, Estimated: 53 mL/min — ABNORMAL LOW (ref >60)
Glucose, Bld: 131 mg/dL — ABNORMAL HIGH (ref 70–99)
Potassium: 4.1 mmol/L (ref 3.5–5.1)
Sodium: 135 mmol/L (ref 135–145)

## 2022-06-27 LAB — CBC
HCT: 40.4 % (ref 39.0–52.0)
Hemoglobin: 13.2 g/dL (ref 13.0–17.0)
MCH: 31.3 pg (ref 26.0–34.0)
MCHC: 32.7 g/dL (ref 30.0–36.0)
MCV: 95.7 fL (ref 80.0–100.0)
Platelets: 390 10*3/uL (ref 150–400)
RBC: 4.22 MIL/uL (ref 4.22–5.81)
RDW: 15.4 % (ref 11.5–15.5)
WBC: 11.4 10*3/uL — ABNORMAL HIGH (ref 4.0–10.5)
nRBC: 0 % (ref 0.0–0.2)

## 2022-06-27 LAB — GLUCOSE, CAPILLARY
Glucose-Capillary: 157 mg/dL — ABNORMAL HIGH (ref 70–99)
Glucose-Capillary: 151 mg/dL — ABNORMAL HIGH (ref 70–99)
Glucose-Capillary: 170 mg/dL — ABNORMAL HIGH (ref 70–99)
Glucose-Capillary: 149 mg/dL — ABNORMAL HIGH (ref 70–99)
Glucose-Capillary: 214 mg/dL — ABNORMAL HIGH (ref 70–99)

## 2022-06-27 MED ORDER — AMOXICILLIN-POT CLAVULANATE 875-125 MG PO TABS
1.0000 | ORAL_TABLET | Freq: Two times a day (BID) | ORAL | Status: DC
  Administered 2022-06-27 – 2022-06-28 (×3): 1 via ORAL
  Filled 2022-06-27 (×3): qty 1

## 2022-06-27 NOTE — Progress Notes (Addendum)
  Subjective: Patient had bowel movement yesterday evening.  Is hungry.  No significant abdominal pain.  Objective: Vital signs in last 24 hours: Temp:  [97.1 F (36.2 C)-98.6 F (37 C)] 97.9 F (36.6 C) (04/01 0545) Pulse Rate:  [70-86] 74 (04/01 0545) Resp:  [18-20] 20 (04/01 0545) BP: (131-164)/(75-86) 149/86 (04/01 0545) SpO2:  [92 %-99 %] 98 % (04/01 0545) Last BM Date : 06/26/22  Intake/Output from previous day: 03/31 0701 - 04/01 0700 In: 837.2 [P.O.:720; IV Piggyback:117.2] Out: 3100 [Urine:3100] Intake/Output this shift: No intake/output data recorded.  General appearance: alert, cooperative, and no distress GI: Soft with minimal tenderness in the left lower quadrant to deep palpation.  No rigidity noted.  Bowel sounds present.  Lab Results:  Recent Labs    06/25/22 0455 06/27/22 0421  WBC 12.5* 11.4*  HGB 13.8 13.2  HCT 42.2 40.4  PLT 266 390   BMET Recent Labs    06/25/22 0455 06/27/22 0421  NA 133* 135  K 3.7 4.1  CL 98 96*  CO2 27 28  GLUCOSE 145* 131*  BUN 19 16  CREATININE 1.57* 1.51*  CALCIUM 8.0* 9.0   PT/INR No results for input(s): "LABPROT", "INR" in the last 72 hours.  Studies/Results: No results found.  Anti-infectives: Anti-infectives (From admission, onward)    Start     Dose/Rate Route Frequency Ordered Stop   06/19/22 0800  piperacillin-tazobactam (ZOSYN) IVPB 3.375 g        3.375 g 12.5 mL/hr over 240 Minutes Intravenous Every 8 hours 06/19/22 0624     06/19/22 0145  ceFEPIme (MAXIPIME) 2 g in sodium chloride 0.9 % 100 mL IVPB        2 g 200 mL/hr over 30 Minutes Intravenous  Once 06/19/22 0140 06/19/22 0226   06/19/22 0145  metroNIDAZOLE (FLAGYL) IVPB 500 mg        500 mg 100 mL/hr over 60 Minutes Intravenous  Once 06/19/22 0140 06/19/22 0255       Assessment/Plan: Impression: Sigmoid diverticulitis with perforation, resolving with conservative measures.  WBC reassuring. Plan: Will advance to heart healthy carb  modified diet.  Anticipate discharge in next 24 to 48 hours.  Would treat his diverticulitis with a total of 14 days of antibiotics.  Patient may follow-up with me as needed.  LOS: 8 days    Aviva Signs 06/27/2022

## 2022-06-27 NOTE — Progress Notes (Signed)
PROGRESS NOTE    Bradley Torres  [id number] DOB: [birth year] DOA: 06/18/2022 PCP: Claretta Fraise, MD    Brief Narrative:  Bradley Torres is a 62 y.o. male with medical history significant of morbid obesity, NSTEMI, CKD 3, hypertension who presents to the emergency department due to abdominal pain which started this morning around 11 AM.  Pain was sharp and rated as 10/10 on pain scale, it was associated with nausea and aggravated with movement.  He denies fever, chills, chest pain, shortness of breath, nausea, vomiting, diarrhea.  Overnight on 3/25 went into a fib with RVR-- started on IV amiodarone and continues to have hgh HR    Assessment and Plan:  Severe sepsis secondary to perforated sigmoid diverticulitis Patient met sepsis criteria due to being tachypneic and WBC> 12 (met SIRS criteria), source of infection was abdomen (met sepsis criteria), lactic acid was elevated at 4.0. Patient was seen by general surgery. CT was repeated on 3/27 which shows more free air.  General surgery does not think the patient needs urgent surgical intervention since he was otherwise stable with no change in exam findings. WBC stable.  Has been afebrile.  Abdominal symptoms have improved.  Had a bowel movement overnight. Seen by surgery this morning.  Diet has been advanced to solids. He remains on Zosyn.  14-day treatment is recommended.  Today is day 8.  Will transition to Augmentin today.  Will recheck his CBC tomorrow.   A fib with RVR Known history of atrial fibrillation.  Was not on any rate limiting medications at home.  Was on Xarelto which is currently on hold.  On full dose Lovenox. Patient was seen by cardiology.  Placed on IV amiodarone.  Converted to sinus rhythm.  Amiodarone has been changed over to oral. Echocardiogram shows normal systolic function left atrial size was mildly dilated.  No significant valvular disease noted. TSH noted to be 6.5.  Free T4 is 1.01. Remains in his rhythm.   Remains on oral amiodarone.  Will wait to see what cardiology says regarding continuation of this medication and outpatient follow-up. Anticipate transition back to Xarelto tomorrow.  Lactic acidosis Resolved   Type 2 diabetes mellitus with uncontrolled hyperglycemia Monitor CBGs.  Continue SSI.  Hypokalemia/hyponatremia Potassium was supplemented.  Continue to check electrolytes periodically.   Prolonged QT interval Monitor electrolytes and correct as indicated.   Acute kidney injury on CKD 3B (baseline creatinine at 1.4-1.5) Creatinine stable for the most part.  He has good urine output.  Continue to monitor urine output and avoid nephrotoxic agents.    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  On full dose Lovenox.  Anticipate transition back to Xarelto tomorrow.   Essential hypertension Blood pressures are reasonably well-controlled.  Mixed hyperlipidemia Lipitor   Chronic diastolic heart failure Compensated.   OSA on CPAP Refuses to wear CPAP  Morbid obesity  Estimated body mass index is 51.85 kg/m as calculated from the following:   Height as of this encounter: 6' (1.829 m).   Weight as of this encounter: 173.4 kg.   DVT prophylaxis: SCDs Start: 06/19/22 0600 CODE STATUS: Full code Family communication: Discussed with patient. Disposition Plan: Continue to mobilize.  Hopefully return home when improved.  Anticipate discharge in 24 to 48 hours.  Status is: Inpatient Remains inpatient appropriate because: Perforated diverticulitis, atrial fibrillation with RVR    Consultants:  General surgery Cardiology   Subjective: Patient feels well.  Had a bowel movement yesterday.  No nausea vomiting.  Ready for solid food.  Objective: Vitals:   06/26/22 2040 06/27/22 0545 06/27/22 0840 06/27/22 0841  BP: (!) 141/75 (!) 149/86 (!) 157/72 (!) 157/72  Pulse: 86 74 79 79  Resp: 20 20  20   Temp: (!) 97.1  F (36.2 C) 97.9 F (36.6 C) 98 F (36.7 C) 98 F (36.7 C)  TempSrc:  Oral Oral   SpO2: 94% 98% 90%   Weight:      Height:        Intake/Output Summary (Last 24 hours) at 06/27/2022 1025 Last data filed at 06/27/2022 0147 Gross per 24 hour  Intake 597.18 ml  Output 2700 ml  Net -2102.82 ml    Filed Weights   06/18/22 2051 06/22/22 0516 06/25/22 0529  Weight: (!) 167.8 kg (!) 172.3 kg (!) 173.4 kg    Examination:  General appearance: Awake alert.  In no distress Resp: Clear to auscultation bilaterally.  Normal effort Cardio: S1-S2 is normal regular.  No S3-S4.  No rubs murmurs or bruit GI: Abdomen is soft.  Nontender nondistended.  Bowel sounds are present normal.  No masses organomegaly Extremities: No edema.  Full range of motion of lower extremities. Neurologic: Alert and oriented x3.  No focal neurological deficits.      Data Reviewed:   CBC: Recent Labs  Lab 06/22/22 0423 06/23/22 0452 06/24/22 0450 06/25/22 0455 06/27/22 0421  WBC 11.6* 9.7 13.0* 12.5* 11.4*  HGB 14.3 14.3 14.4 13.8 13.2  HCT 44.1 44.5 44.2 42.2 40.4  MCV 95.9 95.1 95.1 94.2 95.7  PLT 208 227 268 266 [id number]    Basic Metabolic Panel: Recent Labs  Lab 06/22/22 0423 06/22/22 0509 06/23/22 0452 06/24/22 0450 06/25/22 0455 06/27/22 0421  NA 132*  --  133* 133* 133* 135  K 3.3*  --  3.5 3.7 3.7 4.1  CL 97*  --  97* 98 98 96*  CO2 27  --  27 27 27 28   GLUCOSE 137*  --  130* 137* 145* 131*  BUN 18  --  20 20 19 16   CREATININE 1.39*  --  1.54* 1.61* 1.57* 1.51*  CALCIUM 7.7*  --  7.8* 7.9* 8.0* 9.0  MG  --  2.1 2.2  --   --   --     GFR: Estimated Creatinine Clearance: 85.3 mL/min (A) (by C-G formula based on SCr of 1.51 mg/dL (H)).   CBG: Recent Labs  Lab 06/26/22 1606 06/26/22 2041 06/26/22 2332 06/27/22 0359 06/27/22 0837  GLUCAP 179* 159* 140* 157* 151*      Recent Results (from the past 240 hour(s))  Culture, blood (single)     Status: None   Collection Time:  06/19/22  1:53 AM   Specimen: BLOOD LEFT ARM  Result Value Ref Range Status   Specimen Description BLOOD LEFT ARM  Final   Special Requests   Final    BOTTLES DRAWN AEROBIC AND ANAEROBIC Blood Culture adequate volume   Culture   Final    NO GROWTH 5 DAYS Performed at [facility], [address], [place], [place] [address]    Report Status 06/24/2022 FINAL  Final  MRSA Next Gen by PCR, Nasal     Status: None   Collection Time: 06/20/22 10:26 PM   Specimen: Nasal Mucosa; Nasal Swab  Result Value Ref Range Status   MRSA by PCR Next Gen NOT DETECTED NOT DETECTED Final    Comment: (NOTE) The GeneXpert  MRSA Assay (FDA approved for NASAL specimens only), is one component of a comprehensive MRSA colonization surveillance program. It is not intended to diagnose MRSA infection nor to guide or monitor treatment for MRSA infections. Test performance is not FDA approved in patients less than 38 years old. Performed at [facility], [address] [place], [place] [address]          Radiology Studies: No results found.      Scheduled Meds:  amiodarone  200 mg Oral BID   atorvastatin  20 mg Oral Daily   Chlorhexidine Gluconate Cloth  6 each Topical Daily   docusate sodium  100 mg Oral BID   enoxaparin (LOVENOX) injection  150 mg Subcutaneous Q12H   feeding supplement  1 Container Oral TID BM   insulin aspart  0-9 Units Subcutaneous Q4H   polyethylene glycol  17 g Oral BID   senna-docusate  2 tablet Oral QHS   Continuous Infusions:  sodium chloride Stopped (06/23/22 0532)   piperacillin-tazobactam (ZOSYN)  IV 3.375 g (06/27/22 0511)     LOS: 8 days     Bonnielee Haff,  [facility]  06/27/2022, 10:25 AM

## 2022-06-27 NOTE — Progress Notes (Signed)
Mobility Specialist Progress Note:    06/27/22 1358  Mobility  Activity Ambulated with assistance in hallway  Level of Assistance Standby assist, set-up cues, supervision of patient - no hands on  Assistive Device Front wheel walker  Distance Ambulated (ft) 200 ft  Activity Response Tolerated well  Mobility Referral Yes  $Mobility charge 1 Mobility   Pt agreeable to mobility session. Tolerated well, had audible SOB throughout and c/o "tenderness" in stomach. SpO2 89-94%  on RA during session. Returned pt to EOB, all needs met, call bell in reach.   Royetta Crochet Mobility Specialist Please contact via Solicitor or  Rehab office at [phone]

## 2022-06-27 NOTE — Progress Notes (Signed)
Progress Note  Patient Name: Bradley Torres Date of Encounter: 06/27/2022  Primary Cardiologist: Carlyle Dolly, MD  Subjective   No acute events overnight, NSR on telemetry.  Inpatient Medications    Scheduled Meds:  amiodarone  200 mg Oral BID   amoxicillin-clavulanate  1 tablet Oral Q12H   atorvastatin  20 mg Oral Daily   Chlorhexidine Gluconate Cloth  6 each Topical Daily   docusate sodium  100 mg Oral BID   enoxaparin (LOVENOX) injection  150 mg Subcutaneous Q12H   feeding supplement  1 Container Oral TID BM   insulin aspart  0-9 Units Subcutaneous Q4H   polyethylene glycol  17 g Oral BID   senna-docusate  2 tablet Oral QHS   Continuous Infusions:  sodium chloride Stopped (06/23/22 0532)   PRN Meds: acetaminophen **OR** acetaminophen, HYDROmorphone (DILAUDID) injection, metoprolol tartrate, prochlorperazine   Vital Signs    Vitals:   06/26/22 2040 06/27/22 0545 06/27/22 0840 06/27/22 0841  BP: (!) 141/75 (!) 149/86 (!) 157/72 (!) 157/72  Pulse: 86 74 79 79  Resp: 20 20  20   Temp: (!) 97.1 F (36.2 C) 97.9 F (36.6 C) 98 F (36.7 C) 98 F (36.7 C)  TempSrc:  Oral Oral   SpO2: 94% 98% 90%   Weight:      Height:        Intake/Output Summary (Last 24 hours) at 06/27/2022 1134 Last data filed at 06/27/2022 0147 Gross per 24 hour  Intake 597.18 ml  Output 2250 ml  Net -1652.82 ml   Filed Weights   06/18/22 2051 06/22/22 0516 06/25/22 0529  Weight: (!) 167.8 kg (!) 172.3 kg (!) 173.4 kg    Telemetry     Personally reviewed, NSR  ECG  Not not performed today    Physical Exam   GEN: No acute distress.   Neck: Not examined JVD. Cardiac: RRR, no murmur, rub, or gallop.  Respiratory: Nonlabored. Clear to auscultation bilaterally. GI: Soft, nontender, bowel sounds present. MS: No edema; No deformity. Neuro:  Nonfocal. Psych: Alert and oriented x 3. Normal affect.  Labs    Chemistry Recent Labs  Lab 06/24/22 0450 06/25/22 0455  06/27/22 0421  NA 133* 133* 135  K 3.7 3.7 4.1  CL 98 98 96*  CO2 27 27 28   GLUCOSE 137* 145* 131*  BUN 20 19 16   CREATININE 1.61* 1.57* 1.51*  CALCIUM 7.9* 8.0* 9.0  GFRNONAA 49* 50* 53*  ANIONGAP 8 8 11      Hematology Recent Labs  Lab 06/24/22 0450 06/25/22 0455 06/27/22 0421  WBC 13.0* 12.5* 11.4*  RBC 4.65 4.48 4.22  HGB 14.4 13.8 13.2  HCT 44.2 42.2 40.4  MCV 95.1 94.2 95.7  MCH 31.0 30.8 31.3  MCHC 32.6 32.7 32.7  RDW 14.8 15.3 15.4  PLT 268 266 390    Cardiac EnzymesNo results for input(s): "TROPONINIHS" in the last 720 hours.  BNPNo results for input(s): "BNP", "PROBNP" in the last 168 hours.   DDimerNo results for input(s): "DDIMER" in the last 168 hours.   Radiology    No results found.  Cardiac Studies   Echocardiogram 05/2022 LVEF 50 to [id number] RV systolic function is moderately reduced LA mildly dilated  Assessment & Plan   Patient is a 62 year old M known to have nonobstructive CAD, HTN, paroxysmal A-fib, history of PE is currently admitted to hospitalist team for the management of severe sepsis secondary to perforated sigmoid diverticulitis and A-fib with RVR.  # Paroxysmal A-fib  with RVR, currently NSR -Hospitalist was complicated by A-fib with RVR in the setting of acute illness with sepsis/perforated bowel.  Management complicated by low blood pressures, started on IV amiodarone and switch to p.o. amiodarone regimen. Continue amiodarone 200 mg twice daily x 3 weeks followed by amiodarone 200 mg once daily. -Continue therapeutic Lovenox -Has OSA, refuses CPAP. -Outpatient cardiology follow-up  # History of PE/DVT: On Lovenox in inpatient setting, switch to Xarelto on discharge # Severe sepsis secondary to perforated sigmoid diverticulitis: Followed by general surgery, managed conservatively at this time without plans for surgery. Currently on bowel rest.  [facility] will sign off.   Medication Recommendations: Amiodarone 200 mg twice daily  for 3 weeks followed by amiodarone 200 mg once daily, switch Lovenox to Xarelto upon discharge Other recommendations (labs, testing, etc): None Follow up as an outpatient: Cardiology outpatient follow-up with APP or MD in 1 month upon discharge.  I have spent a total of 30 minutes with patient reviewing chart , telemetry, EKGs, labs and examining patient as well as establishing an assessment and plan that was discussed with the patient.  > 50% of time was spent in direct patient care.    Signed, Chalmers Guest, MD  06/27/2022, 11:34 AM

## 2022-06-28 DIAGNOSIS — A419 Sepsis, unspecified organism: Secondary | ICD-10-CM | POA: Diagnosis not present

## 2022-06-28 DIAGNOSIS — K5720 Diverticulitis of large intestine with perforation and abscess without bleeding: Secondary | ICD-10-CM | POA: Diagnosis not present

## 2022-06-28 DIAGNOSIS — R6520 Severe sepsis without septic shock: Secondary | ICD-10-CM | POA: Diagnosis not present

## 2022-06-28 LAB — CBC
HCT: 40.4 % (ref 39.0–52.0)
Hemoglobin: 13.1 g/dL (ref 13.0–17.0)
MCH: 31.3 pg (ref 26.0–34.0)
MCHC: 32.4 g/dL (ref 30.0–36.0)
MCV: 96.4 fL (ref 80.0–100.0)
Platelets: 386 10*3/uL (ref 150–400)
RBC: 4.19 MIL/uL — ABNORMAL LOW (ref 4.22–5.81)
RDW: 15.1 % (ref 11.5–15.5)
WBC: 12.4 10*3/uL — ABNORMAL HIGH (ref 4.0–10.5)
nRBC: 0 % (ref 0.0–0.2)

## 2022-06-28 LAB — BASIC METABOLIC PANEL
Anion gap: 10 (ref 5–15)
BUN: 18 mg/dL (ref 6–20)
CO2: 33 mmol/L — ABNORMAL HIGH (ref 22–32)
Calcium: 9.2 mg/dL (ref 8.9–10.3)
Chloride: 95 mmol/L — ABNORMAL LOW (ref 98–111)
Creatinine, Ser: 1.58 mg/dL — ABNORMAL HIGH (ref 0.61–1.24)
GFR, Estimated: 50 mL/min — ABNORMAL LOW (ref >60)
Glucose, Bld: 131 mg/dL — ABNORMAL HIGH (ref 70–99)
Potassium: 4.5 mmol/L (ref 3.5–5.1)
Sodium: 138 mmol/L (ref 135–145)

## 2022-06-28 LAB — GLUCOSE, CAPILLARY
Glucose-Capillary: 122 mg/dL — ABNORMAL HIGH (ref 70–99)
Glucose-Capillary: 137 mg/dL — ABNORMAL HIGH (ref 70–99)
Glucose-Capillary: 138 mg/dL — ABNORMAL HIGH (ref 70–99)
Glucose-Capillary: 143 mg/dL — ABNORMAL HIGH (ref 70–99)
Glucose-Capillary: 162 mg/dL — ABNORMAL HIGH (ref 70–99)

## 2022-06-28 MED ORDER — POLYETHYLENE GLYCOL 3350 17 G PO PACK: 17.0000 g | PACK | Freq: Every day | ORAL | 0 refills | Status: AC

## 2022-06-28 MED ORDER — DOCUSATE SODIUM 100 MG PO CAPS: 100.0000 mg | ORAL_CAPSULE | Freq: Two times a day (BID) | ORAL | 0 refills | Status: DC

## 2022-06-28 MED ORDER — AMIODARONE HCL 200 MG PO TABS: ORAL_TABLET | ORAL | 1 refills | Status: DC

## 2022-06-28 MED ORDER — AMOXICILLIN-POT CLAVULANATE 875-125 MG PO TABS: 1.0000 | ORAL_TABLET | Freq: Two times a day (BID) | ORAL | 0 refills | Status: AC

## 2022-06-28 MED ORDER — OXYCODONE-ACETAMINOPHEN 10-325 MG PO TABS: 1.0000 | ORAL_TABLET | Freq: Three times a day (TID) | ORAL | 0 refills | Status: DC | PRN

## 2022-06-28 MED ORDER — SENNOSIDES-DOCUSATE SODIUM 8.6-50 MG PO TABS: 2.0000 | ORAL_TABLET | Freq: Every day | ORAL | 0 refills | Status: DC

## 2022-06-28 MED ORDER — HYDRALAZINE HCL 25 MG PO TABS
50.0000 mg | ORAL_TABLET | Freq: Three times a day (TID) | ORAL | Status: DC
  Administered 2022-06-28 (×2): 50 mg via ORAL
  Filled 2022-06-28 (×2): qty 2

## 2022-06-28 NOTE — Discharge Summary (Signed)
[facility]  Physician Discharge Summary   Patient ID: Bradley Torres MRN: [id number] DOB/AGE: 61-04-1960 61 y.o.  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 Arnoldo Morale with general surgery Cardiology to arrange outpatient follow-up    Home Health: PT/OT declined by patient Equipment/Devices: Declined by patient  CODE STATUS: Full code  DISCHARGE CONDITION: fair  Diet recommendation: Heart healthy  INITIAL HISTORY: Bradley Torres is a 62 y.o. male with medical history significant of morbid obesity, NSTEMI, CKD 3, hypertension who presents to the emergency department due to abdominal pain which started this morning around 11 AM.  Pain was sharp and rated as 10/10 on pain scale, it was associated with nausea and aggravated with movement.  He denies fever, chills, chest pain, shortness of breath, nausea, vomiting, diarrhea.  Overnight on 3/25 went into a fib with RVR-- started on IV amiodarone and continues to have [place]:   Severe sepsis secondary to perforated sigmoid diverticulitis Patient met sepsis criteria due to being tachypneic and WBC> 12 (met SIRS criteria), source of infection was abdomen (met sepsis criteria),  lactic acid was elevated at 4.0. Patient was seen by general surgery. CT was repeated on 3/27 which shows more free air.  General surgery did not think the patient needed surgical intervention since he was otherwise stable with no change in exam findings. Patient's diet was slowly advanced.  He was given laxatives.  He has had bowel movements. He was transition from Zosyn to Augmentin yesterday. WBC noted to be slightly higher today.  Seen by general surgery.  Since he is afebrile and otherwise stable he can be discharged today.  Blood counts can be checked in the outpatient setting.     A fib with RVR Known history of atrial fibrillation.  Was not on any rate limiting medications at home.  Was on Xarelto which is currently on hold.  On full dose Lovenox. Patient was seen by cardiology.  Placed on IV amiodarone.  Converted to sinus rhythm.  Amiodarone has been changed over to oral. Echocardiogram shows normal systolic function left atrial size was mildly dilated.  No significant valvular disease noted. TSH noted to be 6.5.  Free T4 is 1.01. Remains in his rhythm.  Remains on oral amiodarone.  Seen by cardiology yesterday who recommends amiodarone 200 mg twice daily for 3 weeks followed by 200 mg once daily.  They will arrange outpatient follow-up with cardiology. May resume Xarelto.   Lactic acidosis Resolved   Type 2 diabetes mellitus with uncontrolled hyperglycemia Continue home medications   Hypokalemia/hyponatremia Supplemented.  Sodium level is normal.   Prolonged QT interval Monitor electrolytes and correct as indicated.   Acute kidney injury on CKD 3B (baseline creatinine at 1.4-1.5) Creatinine stable for the  most part.  He has good urine output.  Continue to monitor urine output and avoid nephrotoxic agents.    History of saddle pulmonary embolism with right lower extremity DVT May resume Xarelto at discharge   Essential hypertension Holding valsartan and chlorthalidone.   May resume hydralazine.   Mixed hyperlipidemia Lipitor   Chronic diastolic heart failure Compensated.   OSA on CPAP Refuses to wear CPAP   Morbid obesity  Estimated body mass index is 51.85 kg/m as calculated from the following:   Height as of this encounter: 6' (1.829 m).   Weight as of this encounter: 173.4 kg.   Patient is stable.  Okay for discharge home today.  PERTINENT LABS:  The results of significant diagnostics from this hospitalization (including imaging, microbiology, ancillary and laboratory) are listed below for reference.    Microbiology: Recent Results (from the past 240 hour(s))  Culture, blood (single)     Status: None   Collection Time: 06/19/22  1:53 AM   Specimen: BLOOD LEFT ARM  Result Value Ref Range Status   Specimen Description BLOOD LEFT ARM  Final   Special Requests   Final    BOTTLES DRAWN AEROBIC AND ANAEROBIC Blood Culture adequate volume   Culture   Final    NO GROWTH 5 DAYS Performed at [facility], [address], [place], [place] [address]    Report Status 06/24/2022 FINAL  Final  MRSA Next Gen by PCR, Nasal     Status: None   Collection Time: 06/20/22 10:26 PM   Specimen: Nasal Mucosa; Nasal Swab  Result Value Ref Range Status   MRSA by PCR Next Gen NOT DETECTED NOT DETECTED Final    Comment: (NOTE) The GeneXpert MRSA Assay (FDA approved for NASAL specimens only), is one component of a comprehensive MRSA colonization surveillance program. It is not intended to diagnose MRSA infection nor to guide or monitor treatment for MRSA infections. Test performance is not FDA approved in patients less than 59 years old. Performed at [facility], [address], [place], [place] [address]      Labs:   Basic Metabolic Panel: Recent Labs  Lab 06/22/22 0509 06/23/22 0452 06/24/22 0450 06/25/22 0455 06/27/22 0421 06/28/22 0429  NA  --  133* 133* 133* 135 138  K  --  3.5 3.7 3.7 4.1 4.5  CL  --  97* 98 98 96* 95*  CO2  --  27 27 27  28  33*  GLUCOSE  --  130* 137* 145* 131* 131*  BUN  --  20 20 19 16 18   CREATININE  --  1.54* 1.61* 1.57* 1.51* 1.58*  CALCIUM  --  7.8* 7.9* 8.0* 9.0 9.2  MG 2.1 2.2  --   --   --   --     CBC: Recent Labs  Lab 06/23/22 0452 06/24/22 0450 06/25/22 0455 06/27/22 0421 06/28/22 0429  WBC 9.7 13.0* 12.5* 11.4* 12.4*  HGB 14.3 14.4 13.8 13.2 13.1  HCT 44.5 44.2 42.2 40.4 40.4  MCV 95.1 95.1 94.2 95.7 96.4  PLT 227 268 266 390 386     CBG: Recent Labs  Lab 06/27/22 2010 06/28/22 0005 06/28/22 0354 06/28/22 0736 06/28/22 1112  GLUCAP 214* 162* 138* 122* 137*     IMAGING STUDIES CT ABDOMEN PELVIS W CONTRAST  Result Date: 06/22/2022 CLINICAL DATA:  Diverticulitis, complication suspected, sepsis, continued abdominal pain EXAM: CT ABDOMEN AND PELVIS WITH CONTRAST TECHNIQUE: Multidetector CT imaging of the abdomen and pelvis was performed using  the standard protocol following bolus administration of intravenous contrast. RADIATION DOSE REDUCTION: This exam was performed according to the departmental dose-optimization program which includes automated exposure control, adjustment of the mA and/or kV according to patient size and/or use of iterative reconstruction technique. CONTRAST:  12mL OMNIPAQUE IOHEXOL 300 MG/ML SOLN additional oral enteric contrast COMPARISON:  06/19/2022 FINDINGS: Lower chest: Trace bilateral pleural effusions and associated atelectasis or consolidation. Hepatobiliary: No solid liver abnormality is seen. Small gallstones. No gallbladder wall thickening, or biliary dilatation. Pancreas: Unremarkable. No pancreatic ductal dilatation or surrounding inflammatory changes. Spleen: Normal in size without significant abnormality. Adrenals/Urinary Tract: Adrenal glands are unremarkable. Simple, benign right renal cortical cysts, for which no further follow-up or characterization is required. Kidneys are otherwise normal, without renal calculi, solid lesion, or  hydronephrosis. Bladder is unremarkable. Stomach/Bowel: Stomach is within normal limits. Appendix appears normal. Multiple thickened, matted appearing loops of mid small bowel in the ventral left hemiabdomen, appearance and configuration similar to prior examination (series 2, image 74). Descending and sigmoid diverticulosis. Similar appearance of mild wall thickening about the proximal to mid sigmoid with a small perforation arising from the anterior proximal sigmoid (series 2, image 84, 80). Vascular/Lymphatic: Aortic atherosclerosis. No enlarged abdominal or pelvic lymph nodes. Reproductive: No mass or other significant abnormality. Other: No abdominal wall hernia or abnormality. Increased volume of pneumoperitoneum compared to prior examination (series 2, image 60). Musculoskeletal: No acute or significant osseous findings. IMPRESSION: 1. Descending and sigmoid diverticulosis. Similar appearance of mild wall thickening about the proximal to mid sigmoid with a small perforation arising from the anterior proximal sigmoid consistent with diverticulitis complicated by perforation. No evidence of abscess at this time. 2. Increased volume of pneumoperitoneum compared to prior examination. 3. Multiple thickened, matted appearing loops of mid small bowel in the ventral left hemiabdomen, appearance and configuration similar to prior examination. Findings are consistent with nonspecific infectious or inflammatory enteritis, generally of uncertain relation to diverticulitis described above. 4. Trace bilateral pleural effusions and associated atelectasis or consolidation. 5. Cholelithiasis. These results will be called to the ordering clinician or representative by the Radiologist Assistant, and communication documented in the PACS or [employer]. Aortic Atherosclerosis (ICD10-I70.0). Electronically Signed   By: Delanna Ahmadi M.D.   On: 06/22/2022 11:56   ECHOCARDIOGRAM COMPLETE  Result Date: 06/21/2022     ECHOCARDIOGRAM REPORT   Patient Name:   DEREKE BINION Date of Exam: 06/21/2022 Medical Rec #:  [id number]       Height:       72.0 in Accession #:    [id number]      Weight:       370.0 lb Date of Birth:  1960-08-09        BSA:          2.767 m Patient Age:    82 years        BP:           106/91 mmHg Patient Gender: M               HR:           120 bpm. Exam Location:  [facility] Procedure: 2D Echo, Cardiac Doppler, Color Doppler and Intracardiac            Opacification Agent Indications:    Atrial Fibrillation I48.91  History:        Patient has prior history of Echocardiogram examinations, most  recent 05/21/2019. CAD and Previous Myocardial Infarction, P.E.                 and Stroke; Risk Factors:Sleep Apnea, Diabetes, Dyslipidemia,                 Non-Smoker and Hypertension.  Sonographer:    Greer Pickerel Referring Phys: [phone] JESSICA U Eliseo Squires  Sonographer Comments: Patient is obese, suboptimal subcostal window and Technically difficult study due to poor echo windows. Image acquisition challenging due to respiratory motion. IMPRESSIONS  1. Left ventricular ejection fraction, by estimation, is 50 to 55% - difficuly evaluation in the setting of rapid atrial fibrillation and variable contraction from beat to beat. There is intermittent septal bounce in association with respiration. The left ventricle has low normal function. The left ventricle demonstrates global hypokinesis. Left ventricular diastolic parameters are indeterminate.  2. Right ventricular systolic function is moderately reduced. The right ventricular size is mildly enlarged. There is mildly elevated pulmonary artery systolic pressure. The estimated right ventricular systolic pressure is [id number] mmHg.  3. Left atrial size was mildly dilated.  4. The mitral valve is grossly normal. Trivial mitral valve regurgitation.  5. The aortic valve is tricuspid. Aortic valve regurgitation is not visualized.  6. The inferior vena cava is dilated in size  with <50% respiratory variability, suggesting right atrial pressure of 15 mmHg. Comparison(s): Prior images reviewed side by side. Difficult to compare since patient currently in rapid atrial fibrillation. Consider follow-up limited study once back in sinus rhythm. FINDINGS  Left Ventricle: Left ventricular ejection fraction, by estimation, is 50 to 55%. The left ventricle has low normal function. The left ventricle demonstrates global hypokinesis. Definity contrast agent was given IV to delineate the left ventricular endocardial borders. The left ventricular internal cavity size was normal in size. There is no left ventricular hypertrophy. Left ventricular diastolic function could not be evaluated due to atrial fibrillation. Left ventricular diastolic parameters are indeterminate. Right Ventricle: The right ventricular size is mildly enlarged. No increase in right ventricular wall thickness. Right ventricular systolic function is moderately reduced. There is mildly elevated pulmonary artery systolic pressure. The tricuspid regurgitant velocity is 2.61 m/s, and with an assumed right atrial pressure of 15 mmHg, the estimated right ventricular systolic pressure is [id number] mmHg. Left Atrium: Left atrial size was mildly dilated. Right Atrium: Right atrial size was normal in size. Pericardium: There is no evidence of pericardial effusion. Mitral Valve: The mitral valve is grossly normal. Trivial mitral valve regurgitation. Tricuspid Valve: The tricuspid valve is grossly normal. Tricuspid valve regurgitation is mild. Aortic Valve: The aortic valve is tricuspid. There is mild aortic valve annular calcification. Aortic valve regurgitation is not visualized. Pulmonic Valve: The pulmonic valve was grossly normal. Pulmonic valve regurgitation is trivial. Aorta: The aortic root is normal in size and structure. Venous: The inferior vena cava is dilated in size with less than 50% respiratory variability, suggesting right atrial  pressure of 15 mmHg. IAS/Shunts: No atrial level shunt detected by color flow Doppler.  LEFT VENTRICLE PLAX 2D LVIDd:         4.70 cm   Diastology LVIDs:         3.50 cm   LV e' medial:    5.98 cm/s LV PW:         0.90 cm   LV E/e' medial:  11.5 LV IVS:        0.90 cm   LV e' lateral:   12.50 cm/s LVOT diam:  2.10 cm   LV E/e' lateral: 5.5 LV SV:         42 LV SV Index:   15 LVOT Area:     3.46 cm  RIGHT VENTRICLE RV S prime:     11.00 cm/s TAPSE (M-mode): 1.2 cm LEFT ATRIUM              Index        RIGHT ATRIUM           Index LA diam:        4.30 cm  1.55 cm/m   RA Area:     21.90 cm LA Vol (A2C):   116.0 ml 41.92 ml/m  RA Volume:   66.60 ml  24.07 ml/m LA Vol (A4C):   84.7 ml  30.61 ml/m LA Biplane Vol: 101.0 ml 36.50 ml/m  AORTIC VALVE LVOT Vmax:   83.60 cm/s LVOT Vmean:  53.600 cm/s LVOT VTI:    0.120 m  AORTA Ao Root diam: 4.20 cm MITRAL VALVE               TRICUSPID VALVE MV Area (PHT): 4.52 cm    TR Peak grad:   27.2 mmHg MV Decel Time: 168 msec    TR Vmax:        261.00 cm/s MV E velocity: 69.00 cm/s                            SHUNTS                            Systemic VTI:  0.12 m                            Systemic Diam: 2.10 cm Rozann Lesches MD Electronically signed by Rozann Lesches MD Signature Date/Time: 06/21/2022/2:44:21 PM    Final    CT ABDOMEN PELVIS W CONTRAST  Result Date: 06/19/2022 CLINICAL DATA:  Left lower quadrant pain for several hours, initial encounter EXAM: CT ABDOMEN AND PELVIS WITH CONTRAST TECHNIQUE: Multidetector CT imaging of the abdomen and pelvis was performed using the standard protocol following bolus administration of intravenous contrast. RADIATION DOSE REDUCTION: This exam was performed according to the departmental dose-optimization program which includes automated exposure control, adjustment of the mA and/or kV according to patient size and/or use of iterative reconstruction technique. CONTRAST:  90mL OMNIPAQUE IOHEXOL 300 MG/ML  SOLN COMPARISON:   01/19/2018 FINDINGS: Lower chest: No acute abnormality. Hepatobiliary: Mild fatty infiltration of the liver is noted. The gallbladder is well distended. Dependent gallstones are seen without complicating factors. Pancreas: Unremarkable. No pancreatic ductal dilatation or surrounding inflammatory changes. Spleen: Normal in size without focal abnormality. Adrenals/Urinary Tract: Adrenal glands are within normal limits bilaterally. Kidneys demonstrate no renal calculi or obstructive changes. Stable upper pole renal cyst on the right is noted. No follow-up is recommended. No obstructive changes are seen. The bladder is partially distended. Stomach/Bowel: There are changes consistent with diverticulitis within the sigmoid colon. Few small pericolonic areas of free air are noted consistent with acute perforation. Multiple locules of free air are noted throughout the abdomen. No obstructive changes are seen. The appendix is within normal limits. Small bowel and stomach are unremarkable. Vascular/Lymphatic: Aortic atherosclerosis. No enlarged abdominal or pelvic lymph nodes. Reproductive: Prostate is unremarkable. Other: No abdominal wall hernia or abnormality. No abdominopelvic ascites. Musculoskeletal: Postsurgical changes  are noted in the lower lumbar spine. IMPRESSION: Changes consistent with sigmoid diverticulitis with associated perforation and scattered free air throughout the abdomen. Cholelithiasis without complicating factors. Fatty liver. Critical Value/emergent results were called by telephone at the time of interpretation on 06/19/2022 at 1:21 am to Dr. Joseph Berkshire , who verbally acknowledged these results. Electronically Signed   By: Inez Catalina M.D.   On: 06/19/2022 01:23    DISCHARGE EXAMINATION:  See progress note from earlier today  DISPOSITION: Home  Discharge Instructions     Call MD for:  difficulty breathing, headache or visual disturbances   Complete by: As directed    Call MD for:   extreme fatigue   Complete by: As directed    Call MD for:  persistant dizziness or light-headedness   Complete by: As directed    Call MD for:  persistant nausea and vomiting   Complete by: As directed    Call MD for:  severe uncontrolled pain   Complete by: As directed    Call MD for:  temperature >100.4   Complete by: As directed    Diet Carb Modified   Complete by: As directed    Discharge instructions   Complete by: As directed    Please take your medications as prescribed.  Seek attention by calling your primary care provider or 911 if your abdominal pain worsens or if you develop chest pain shortness of breath nausea vomiting high fevers or feel dizzy or lightheaded.  You were cared for by a hospitalist during your hospital stay. If you have any questions about your discharge medications or the care you received while you were in the hospital after you are discharged, you can call the unit and asked to speak with the hospitalist on call if the hospitalist that took care of you is not available. Once you are discharged, your primary care physician will handle any further medical issues. Please note that NO REFILLS for any discharge medications will be authorized once you are discharged, as it is imperative that you return to your primary care physician (or establish a relationship with a primary care physician if you do not have one) for your aftercare needs so that they can reassess your need for medications and monitor your lab values. If you do not have a primary care physician, you can call [phone] for a physician referral.   Increase activity slowly   Complete by: As directed           Allergies as of 06/28/2022       Reactions   Actos [pioglitazone] Cough   Some dyspnea & edema ( possibly some CHF?)   Lisinopril Other (See Comments)   wheezing   Bystolic [nebivolol Hcl] Swelling   Ibuprofen Other (See Comments)   Rectal bleed   Spironolactone Hives   Trulicity  [dulaglutide]    Sob, dry cough, GI   Amlodipine Itching, Other (See Comments)   Leg pain.   Diflucan [fluconazole] Rash   Hctz [hydrochlorothiazide] Other (See Comments)   Caused gout flares.   Losartan Rash        Medication List     STOP taking these medications    chlorthalidone 25 MG tablet Commonly known as: HYGROTON   doxycycline 100 MG tablet Commonly known as: VIBRA-TABS   valsartan 160 MG tablet Commonly known as: DIOVAN       TAKE these medications    Accu-Chek Guide test strip Generic drug: glucose blood Use as instructed to  monitor glucose twice daily   Accu-Chek Softclix Lancets lancets Use as instructed to monitor glucose twice daily   allopurinol 100 MG tablet Commonly known as: ZYLOPRIM Take 1 tablet (100 mg total) by mouth daily.   amiodarone 200 MG tablet Commonly known as: PACERONE Take 200 mg twice daily for 3 weeks and then take 200 mg once daily   amoxicillin-clavulanate 875-125 MG tablet Commonly known as: AUGMENTIN Take 1 tablet by mouth every 12 (twelve) hours for 6 days.   atorvastatin 20 MG tablet Commonly known as: LIPITOR Take 1 tablet (20 mg total) by mouth daily.   dapagliflozin propanediol 10 MG Tabs tablet Commonly known as: Farxiga TAKE ONE TABLET BY MOUTH DAILY BEFORE BREAKFAST   DHEA 25 MG Caps Take by mouth daily.   docusate sodium 100 MG capsule Commonly known as: COLACE Take 1 capsule (100 mg total) by mouth 2 (two) times daily.   gabapentin 300 MG capsule Commonly known as: NEURONTIN Take 1 capsule by mouth daily as needed (pain).   hydrALAZINE 50 MG tablet Commonly known as: APRESOLINE TAKE ONE TABLET BY MOUTH THREE TIMES DAILY What changed: when to take this   metFORMIN 750 MG 24 hr tablet Commonly known as: GLUCOPHAGE-XR Take 2 tablets (1,500 mg total) by mouth daily with breakfast.   oxyCODONE-acetaminophen 10-325 MG tablet Commonly known as: PERCOCET Take 1 tablet by mouth 3 (three) times  daily as needed for pain.   polyethylene glycol 17 g packet Commonly known as: MIRALAX / GLYCOLAX Take 17 g by mouth daily.   rivaroxaban 20 MG Tabs tablet Commonly known as: Xarelto TAKE ONE TABLET BY MOUTH DAILY WITH SUPPER What changed:  how much to take how to take this when to take this additional instructions   senna-docusate 8.6-50 MG tablet Commonly known as: Senokot-S Take 2 tablets by mouth at bedtime.   sitaGLIPtin 100 MG tablet Commonly known as: Januvia Take 1 tablet (100 mg total) by mouth daily.   testosterone cypionate 200 MG/ML injection Commonly known as: DEPOTESTOSTERONE CYPIONATE Inject 200 mg into the muscle every 14 (fourteen) days.   Vitamin D3 250 MCG (10000 UT) Caps Generic drug: Cholecalciferol Take 10,000 Units by mouth daily.          Follow-up Information     Aviva Signs, MD Follow up.   Specialty: General Surgery Why: As needed Contact information: [id number] [phone]                 TOTAL DISCHARGE TIME: 35 minutes  [facility] Pager on [email or web address]  06/28/2022, 11:31 AM

## 2022-06-28 NOTE — TOC Initial Note (Signed)
Transition of Care [facility]) - Initial/Assessment Note    Patient Details  Name: Bradley Torres MRN: [id number] Date of Birth: 10/30/1960  Transition of Care [name]) CM/SW Contact:    Shade Flood, LCSW Phone Number: 06/28/2022, 9:55 AM  Clinical Narrative:                  TOC following. Per MD, pt likely to be stable for dc in next 1-2 days. Pt ambulated with the mobility specialist yesterday using a RW and walked 200 feet. Reviewed dc planning needs with pt today. Per pt, he plans to return home at dc. He states his brother lives right next door and will be available to assist if needed. Offered HHPT/outpatient PT and pt stated that he does not think he needs either option. Offered to order RW for pt who stated that he does not think he needs that either.   Pt aware TOC will be available to assist if he changes his mind.  Expected Discharge Plan: Home/Self Care Barriers to Discharge: Continued Medical Work up   Patient Goals and CMS Choice Patient states their goals for this hospitalization and ongoing recovery are:: go home          Expected Discharge Plan and Services In-house Referral: Clinical Social Work     Living arrangements for the past 2 months: Single Family Home                                      Prior Living Arrangements/Services Living arrangements for the past 2 months: Single Family Home Lives with:: Self Patient language and need for interpreter reviewed:: Yes Do you feel safe going back to the place where you live?: Yes      Need for Family Participation in Patient Care: No (Comment)     Criminal Activity/Legal Involvement Pertinent to Current Situation/Hospitalization: No - Comment as needed  Activities of Daily Living Home Assistive Devices/Equipment: None ADL Screening (condition at time of admission) Patient's cognitive ability adequate to safely complete daily activities?: Yes Is the patient deaf or have difficulty hearing?: No Does  the patient have difficulty seeing, even when wearing glasses/contacts?: No Does the patient have difficulty concentrating, remembering, or making decisions?: No Patient able to express need for assistance with ADLs?: Yes Does the patient have difficulty dressing or bathing?: No Independently performs ADLs?: Yes (appropriate for developmental age) Does the patient have difficulty walking or climbing stairs?: Yes Weakness of Legs: None Weakness of Arms/Hands: None  Permission Sought/Granted                  Emotional Assessment   Attitude/Demeanor/Rapport: Engaged Affect (typically observed): Pleasant Orientation: : Oriented to Self, Oriented to Place, Oriented to  Time, Oriented to Situation Alcohol / Substance Use: Not Applicable Psych Involvement: No (comment)  Admission diagnosis:  Diverticulitis [K57.92] Severe sepsis [A41.9, R65.20] Diverticulitis of large intestine with perforation without bleeding [K57.20] Patient Active Problem List   Diagnosis Date Noted   Atrial fibrillation with RVR 06/27/2022   Severe sepsis 06/19/2022   Sigmoid diverticulitis 06/19/2022   Prolonged QT interval 06/19/2022   Mixed hyperlipidemia 06/19/2022   Acute pulmonary embolism 06/19/2022   History of DVT (deep vein thrombosis) 06/19/2022   Diverticulitis of large intestine with perforation without bleeding 06/19/2022   Diverticulitis 06/19/2022   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/23/2021   History of pulmonary embolus (PE) 10/24/2020   Paroxysmal  atrial fibrillation 12/31/2019   Vitamin D deficiency    Type 2 diabetes mellitus with hyperglycemia, without long-term current use of insulin    Sleep related hypoxia 11/01/2019   Severe obstructive sleep apnea-hypopnea syndrome 08/01/2019   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   Arteriovenous malformation 08/01/2019   Primary osteoarthritis involving multiple joints 06/21/2019   Dysphagia 10/17/2018   Anticoagulated by anticoagulation  treatment 03/06/2018   Hx of non-ST elevation myocardial infarction (NSTEMI) 03/06/2018   Chronic diastolic CHF (congestive heart failure)    Chronic pain syndrome    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 02/27/2018   CKD (chronic kidney disease) stage 3, GFR 30-59 ml/min 01/20/2018   Morbid (severe) obesity due to excess calories 01/20/2018   Acute kidney injury superimposed on chronic kidney disease 01/19/2018   Disorder of left rotator cuff 11/01/2017   Hx of adenomatous colonic polyps 07/26/2017   Family hx of colon cancer 05/03/2017   Hypertension associated with diabetes ([place]) 01/23/2014   Chronic bilateral back pain 01/23/2014   PCP:  Claretta Fraise, MD Pharmacy:   [place], [place] [address] Phone: [phone] Fax: [phone]     Social Determinants of Health (SDOH) Social History: SDOH Screenings   Food Insecurity: No Food Insecurity (06/22/2022)  Housing: Low Risk  (06/22/2022)  Transportation Needs: No Transportation Needs (06/22/2022)  Utilities: Not At Risk (06/22/2022)  Depression (PHQ2-9): Low Risk  (05/11/2022)  Financial Resource Strain: Medium Risk (05/18/2020)  Social Connections: Unknown (01/16/2020)  Stress: Stress Concern Present (05/15/2019)  Tobacco Use: Low Risk  (06/18/2022)   SDOH Interventions:     Readmission Risk Interventions     No data to display

## 2022-06-28 NOTE — Progress Notes (Signed)
Mobility Specialist Progress Note:    06/28/22 1045  Mobility  Activity Ambulated with assistance to bathroom;Ambulated with assistance in hallway  Level of Assistance Standby assist, set-up cues, supervision of patient - no hands on  Assistive Device Front wheel walker  Distance Ambulated (ft) 130 ft  Activity Response Tolerated well  Mobility Referral Yes  $Mobility charge 1 Mobility   Pt agreeable to mobility session. Assisted pt to bathroom and in hallway with SB assistance. Tolerated session well, returned pt to EOB. All needs met, call bell in reach.   Royetta Crochet Mobility Specialist Please contact via Solicitor or  Rehab office at [phone]

## 2022-06-28 NOTE — Progress Notes (Addendum)
[facility] Progress Note     Subjective: Patient seen and examined.  He is resting comfortably in bed.  He complains of some left-sided pain, which is unchanged from the last couple of days, but is significantly better than the pain that brought him to the hospital.  He is tolerating his diet without nausea and vomiting, and is moving his bowels.  He just had a bowel movement this morning.  Denies fevers and chills.  Objective: Vital signs in last 24 hours: Temp:  [98.1 F (36.7 C)-98.9 F (37.2 C)] 98.5 F (36.9 C) (04/02 0900) Pulse Rate:  [72-80] 74 (04/02 0900) Resp:  [19-22] 22 (04/02 0401) BP: (137-158)/(67-86) 158/78 (04/02 0900) SpO2:  [90 %-97 %] 90 % (04/02 0900) Last BM Date : 06/26/22  Intake/Output from previous day: 04/01 0701 - 04/02 0700 In: -  Out: 1600 [Urine:1600] Intake/Output this shift: Total I/O In: -  Out: 350 [Urine:350]  General appearance: alert, cooperative, and no distress GI: Abdomen soft, nondistended, no percussion tenderness, mild left-sided tenderness to palpation; no rigidity, guarding, rebound tenderness  Lab Results:  Recent Labs    06/27/22 0421 06/28/22 0429  WBC 11.4* 12.4*  HGB 13.2 13.1  HCT 40.4 40.4  PLT 390 386   BMET Recent Labs    06/27/22 0421 06/28/22 0429  NA 135 138  K 4.1 4.5  CL 96* 95*  CO2 28 33*  GLUCOSE 131* 131*  BUN 16 18  CREATININE 1.51* 1.58*  CALCIUM 9.0 9.2   PT/INR No results for input(s): "LABPROT", "INR" in the last 72 hours.  Studies/Results: No results found.  Anti-infectives: Anti-infectives (From admission, onward)    Start     Dose/Rate Route Frequency Ordered Stop   06/27/22 1115  amoxicillin-clavulanate (AUGMENTIN) 875-125 MG per tablet 1 tablet        1 tablet Oral Every 12 hours 06/27/22 1028     06/19/22 0800  piperacillin-tazobactam (ZOSYN) IVPB 3.375 g  Status:  Discontinued        3.375 g 12.5 mL/hr over 240 Minutes Intravenous Every 8 hours 06/19/22  0624 06/27/22 1028   06/19/22 0145  ceFEPIme (MAXIPIME) 2 g in sodium chloride 0.9 % 100 mL IVPB        2 g 200 mL/hr over 30 Minutes Intravenous  Once 06/19/22 0140 06/19/22 0226   06/19/22 0145  metroNIDAZOLE (FLAGYL) IVPB 500 mg        500 mg 100 mL/hr over 60 Minutes Intravenous  Once 06/19/22 0140 06/19/22 0255       Assessment/Plan:  Patient is a 62 year old male who was admitted with sigmoid diverticulitis with perforation, resolving with conservative measures.  -WBC slightly elevated today at 12.4 from 11.4. With patient tolerating diet, moving his bowels, no worsening abdominal pain, the slight increase is not concerning to me -Continue to treat diverticulitis for a total of 14 days of antibiotics -Heart healthy carb modified diet -Stable for discharge from general surgery standpoint -Patient may follow-up with Dr. Arnoldo Morale next week for repeat CBC if felt necessary by hospitalist -Patient should follow up with his PCP in the next month -Advised the patient to return to the hospital if he begins to have worsening abdominal pain like what initially brought him to the hospital   LOS: 9 days    Sherian Valenza A Lyndal Reggio 06/28/2022

## 2022-06-28 NOTE — Progress Notes (Signed)
Discharge instructions given on medications and follow up visits, patient verbalized understanding. Prescriptions sent to Pharmacy of choice documented in AVS. IV discontinued,catheter intact. Accompanied by staff to an awaiting  vehicle. 

## 2022-06-28 NOTE — Progress Notes (Addendum)
PROGRESS NOTE    OBERON Torres  [id number] DOB: 1960-06-16 DOA: 06/18/2022 PCP: Claretta Fraise, MD    Brief Narrative:  Bradley Torres is a 62 y.o. male with medical history significant of morbid obesity, NSTEMI, CKD 3, hypertension who presents to the emergency department due to abdominal pain which started this morning around 11 AM.  Pain was sharp and rated as 10/10 on pain scale, it was associated with nausea and aggravated with movement.  He denies fever, chills, chest pain, shortness of breath, nausea, vomiting, diarrhea.  Overnight on 3/25 went into a fib with RVR-- started on IV amiodarone and continues to have hgh HR    Assessment and Plan:  Severe sepsis secondary to perforated sigmoid diverticulitis Patient met sepsis criteria due to being tachypneic and WBC> 12 (met SIRS criteria), source of infection was abdomen (met sepsis criteria), lactic acid was elevated at 4.0. Patient was seen by general surgery. CT was repeated on 3/27 which shows more free air.  General surgery did not think the patient needed surgical intervention since he was otherwise stable with no change in exam findings. Patient's diet was slowly advanced.  He was given laxatives.  He has had bowel movements. He was transition from Zosyn to Augmentin yesterday. WBC noted to be slightly higher today.  He is afebrile.  Wait for general surgery input.  Will recheck WBC tomorrow.   A fib with RVR Known history of atrial fibrillation.  Was not on any rate limiting medications at home.  Was on Xarelto which is currently on hold.  On full dose Lovenox. Patient was seen by cardiology.  Placed on IV amiodarone.  Converted to sinus rhythm.  Amiodarone has been changed over to oral. Echocardiogram shows normal systolic function left atrial size was mildly dilated.  No significant valvular disease noted. TSH noted to be 6.5.  Free T4 is 1.01. Remains in his rhythm.  Remains on oral amiodarone.  Seen by cardiology  yesterday who recommends amiodarone 200 mg twice daily for 3 weeks followed by 200 mg once daily.  They will arrange outpatient follow-up with cardiology. Leave him on Lovenox for now.  Wait for general surgery input regarding the leukocytosis.  Hopefully transition back to Xarelto at discharge.    Lactic acidosis Resolved   Type 2 diabetes mellitus with uncontrolled hyperglycemia CBGs are reasonably well-controlled.  Continue SSI.  Change to ACHS coverage.  Hypokalemia/hyponatremia Supplemented.  Sodium level is normal.   Prolonged QT interval Monitor electrolytes and correct as indicated.   Acute kidney injury on CKD 3B (baseline creatinine at 1.4-1.5) Creatinine stable for the most part.  He has good urine output.  Continue to monitor urine output and avoid nephrotoxic agents.    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  On full dose Lovenox.  Anticipate transition back to Xarelto at discharge.   Essential hypertension High blood pressure readings noted.  Prior to admission he was on hydralazine and valsartan.  In view of elevated creatinine we will not resume the ARB.  Since he is on amiodarone okay to resume hydralazine.  Patient is allergic to amlodipine.   Mixed hyperlipidemia Lipitor   Chronic diastolic heart failure Compensated.   OSA on CPAP Refuses to wear CPAP  Morbid obesity  Estimated body mass index is 51.85 kg/m as calculated from the following:   Height as of this encounter: 6' (1.829 m).   Weight as of this encounter: 173.4  kg.   DVT prophylaxis: On Lovenox CODE STATUS: Full code Family communication: Discussed with patient. Disposition Plan: Anticipate discharge in 24 to 48 hours.  Status is: Inpatient Remains inpatient appropriate because: Perforated diverticulitis, atrial fibrillation with RVR    Consultants:  General surgery Cardiology   Subjective: Feels well for the most  part.  Occasional left-sided abdominal pain.  No nausea or vomiting.  Tolerating his diet.  Had bowel movement.  Objective: Vitals:   06/27/22 1631 06/27/22 2007 06/28/22 0401 06/28/22 0900  BP: (!) 157/76 (!) 149/86 137/67 (!) 158/78  Pulse: 77 80 72 74  Resp: 19 19 (!) 22   Temp: 98.3 F (36.8 C) 98.9 F (37.2 C) 98.1 F (36.7 C) 98.5 F (36.9 C)  TempSrc: Oral Oral Oral Oral  SpO2: 97% 94% 93% 90%  Weight:      Height:        Intake/Output Summary (Last 24 hours) at 06/28/2022 [id number] Last data filed at 06/28/2022 0737 Gross per 24 hour  Intake --  Output 1950 ml  Net -1950 ml    Filed Weights   06/18/22 2051 06/22/22 0516 06/25/22 0529  Weight: (!) 167.8 kg (!) 172.3 kg (!) 173.4 kg    Examination:  General appearance: Awake alert.  In no distress Resp: Clear to auscultation bilaterally.  Normal effort Cardio: S1-S2 is normal regular.  No S3-S4.  No rubs murmurs or bruit GI: Abdomen is soft.  Mildly tender in the left lower quadrant without any rebound rigidity or guarding. No obvious focal neurological deficits.   Data Reviewed:   CBC: Recent Labs  Lab 06/23/22 0452 06/24/22 0450 06/25/22 0455 06/27/22 0421 06/28/22 0429  WBC 9.7 13.0* 12.5* 11.4* 12.4*  HGB 14.3 14.4 13.8 13.2 13.1  HCT 44.5 44.2 42.2 40.4 40.4  MCV 95.1 95.1 94.2 95.7 96.4  PLT 227 268 266 390 [id number]    Basic Metabolic Panel: Recent Labs  Lab 06/22/22 0509 06/23/22 0452 06/24/22 0450 06/25/22 0455 06/27/22 0421 06/28/22 0429  NA  --  133* 133* 133* 135 138  K  --  3.5 3.7 3.7 4.1 4.5  CL  --  97* 98 98 96* 95*  CO2  --  27 27 27 28  33*  GLUCOSE  --  130* 137* 145* 131* 131*  BUN  --  20 20 19 16 18   CREATININE  --  1.54* 1.61* 1.57* 1.51* 1.58*  CALCIUM  --  7.8* 7.9* 8.0* 9.0 9.2  MG 2.1 2.2  --   --   --   --     GFR: Estimated Creatinine Clearance: 81.5 mL/min (A) (by C-G formula based on SCr of 1.58 mg/dL (H)).   CBG: Recent Labs  Lab 06/27/22 1746 06/27/22 2010  06/28/22 0005 06/28/22 0354 06/28/22 0736  GLUCAP 149* 214* 162* 138* 122*      Recent Results (from the past 240 hour(s))  Culture, blood (single)     Status: None   Collection Time: 06/19/22  1:53 AM   Specimen: BLOOD LEFT ARM  Result Value Ref Range Status   Specimen Description BLOOD LEFT ARM  Final   Special Requests   Final    BOTTLES DRAWN AEROBIC AND ANAEROBIC Blood Culture adequate volume   Culture   Final    NO GROWTH 5 DAYS Performed at [facility], [address], [place], [place] [address]    Report Status 06/24/2022 FINAL  Final  MRSA Next Gen by PCR, Nasal     Status:  None   Collection Time: 06/20/22 10:26 PM   Specimen: Nasal Mucosa; Nasal Swab  Result Value Ref Range Status   MRSA by PCR Next Gen NOT DETECTED NOT DETECTED Final    Comment: (NOTE) The GeneXpert MRSA Assay (FDA approved for NASAL specimens only), is one component of a comprehensive MRSA colonization surveillance program. It is not intended to diagnose MRSA infection nor to guide or monitor treatment for MRSA infections. Test performance is not FDA approved in patients less than 61 years old. Performed at [facility], [address], [place], [place] [address]          Radiology Studies: No results found.      Scheduled Meds:  amiodarone  200 mg Oral BID   amoxicillin-clavulanate  1 tablet Oral Q12H   atorvastatin  20 mg Oral Daily   docusate sodium  100 mg Oral BID   enoxaparin (LOVENOX) injection  150 mg Subcutaneous Q12H   feeding supplement  1 Container Oral TID BM   insulin aspart  0-9 Units Subcutaneous Q4H   polyethylene glycol  17 g Oral BID   senna-docusate  2 tablet Oral QHS   Continuous Infusions:  sodium chloride Stopped (06/23/22 0532)     LOS: 9 days     Bonnielee Haff,  [facility]  06/28/2022, 9:38 AM

## 2022-06-28 NOTE — Progress Notes (Signed)
ANTICOAGULATION CONSULT NOTE   Pharmacy Consult for Lovenox Indication: hx PE and DVT  Allergies  Allergen Reactions   Actos [Pioglitazone] Cough    Some dyspnea & edema ( possibly some CHF?)   Lisinopril Other (See Comments)    wheezing   Bystolic [Nebivolol Hcl] Swelling   Ibuprofen Other (See Comments)    Rectal bleed   Spironolactone Hives   Trulicity [Dulaglutide]     Sob, dry cough, GI   Amlodipine Itching and Other (See Comments)    Leg pain.   Diflucan [Fluconazole] Rash   Hctz [Hydrochlorothiazide] Other (See Comments)    Caused gout flares.   Losartan Rash    Patient Measurements: Height: 6' (182.9 cm) Weight: (!) 173.4 kg (382 lb 4.4 oz) IBW/kg (Calculated) : 77.6 Heparin Dosing Weight:   Vital Signs: Temp: 98.1 F (36.7 C) (04/02 0401) Temp Source: Oral (04/02 0401) BP: 137/67 (04/02 0401) Pulse Rate: 72 (04/02 0401)  Labs: Recent Labs    06/27/22 0421 06/28/22 0429  HGB 13.2 13.1  HCT 40.4 40.4  PLT 390 386  CREATININE 1.51* 1.58*     Estimated Creatinine Clearance: 81.5 mL/min (A) (by C-G formula based on SCr of 1.58 mg/dL (H)).   Medical History: Past Medical History:  Diagnosis Date   CAD (coronary artery disease)    a. 12/2017: cath showing 10% Proximal-LAD stenosis with no significant obstructive disease and LVEDP mildly elevated at 18 mm Hg.    Chronic back pain    Diabetes mellitus, type 2 (HCC)    DVT (deep venous thrombosis) (HCC)    Essential hypertension    MI (myocardial infarction) ([place]) 2019   Morbid obesity ([place])    Panic disorder    Shoulder pain, left    Following fall   Sleep apnea    Stroke [facility]) 01/2018   Vitamin D deficiency    Assessment: Pharmacy consulted to dose Lovenox in patient with history of pulmonary embolism and DVT.  Patient is on Xarelto prior to admission and currently holding due to possible surgical intervention.  3/28 LMWH level: 1.01- slightly supratherapeutic -dose adjusted.   Xarelto to  restart at discharge. Will continue current dose of lovenox for now. Will consider rechecking levels if stay is prolonged. Hemoglobin has remained stable in 13s.   Goal of Therapy:   Monitor platelets by anticoagulation protocol: Yes   Plan:  Continue lovenox 150 mg subq every 12 hours. Monitor Anti-Xa levels as needed  Erin Hearing PharmD., BCPS Clinical Pharmacist 06/28/2022 8:23 AM

## 2022-07-04 DIAGNOSIS — Z09 Encounter for follow-up examination after completed treatment for conditions other than malignant neoplasm: Secondary | ICD-10-CM | POA: Diagnosis not present

## 2022-07-04 DIAGNOSIS — E291 Testicular hypofunction: Secondary | ICD-10-CM | POA: Diagnosis not present

## 2022-07-04 DIAGNOSIS — K5792 Diverticulitis of intestine, part unspecified, without perforation or abscess without bleeding: Secondary | ICD-10-CM | POA: Diagnosis not present

## 2022-07-04 DIAGNOSIS — Z6841 Body Mass Index (BMI) 40.0 and over, adult: Secondary | ICD-10-CM | POA: Diagnosis not present

## 2022-07-04 DIAGNOSIS — Z79899 Other long term (current) drug therapy: Secondary | ICD-10-CM | POA: Diagnosis not present

## 2022-07-04 DIAGNOSIS — E1165 Type 2 diabetes mellitus with hyperglycemia: Secondary | ICD-10-CM | POA: Diagnosis not present

## 2022-07-04 DIAGNOSIS — M539 Dorsopathy, unspecified: Secondary | ICD-10-CM | POA: Diagnosis not present

## 2022-07-04 DIAGNOSIS — R030 Elevated blood-pressure reading, without diagnosis of hypertension: Secondary | ICD-10-CM | POA: Diagnosis not present

## 2022-07-07 DIAGNOSIS — Z79899 Other long term (current) drug therapy: Secondary | ICD-10-CM | POA: Diagnosis not present

## 2022-07-08 ENCOUNTER — Encounter: Payer: Self-pay | Admitting: Family Medicine

## 2022-07-08 ENCOUNTER — Ambulatory Visit: Payer: Medicaid Other | Admitting: Family Medicine

## 2022-07-08 VITALS — BP 125/72 | HR 87 | Temp 98.0°F | Ht 72.0 in | Wt 360.0 lb

## 2022-07-08 DIAGNOSIS — R062 Wheezing: Secondary | ICD-10-CM

## 2022-07-08 DIAGNOSIS — K5720 Diverticulitis of large intestine with perforation and abscess without bleeding: Secondary | ICD-10-CM | POA: Diagnosis not present

## 2022-07-08 DIAGNOSIS — K5781 Diverticulitis of intestine, part unspecified, with perforation and abscess with bleeding: Secondary | ICD-10-CM | POA: Diagnosis not present

## 2022-07-08 DIAGNOSIS — N179 Acute kidney failure, unspecified: Secondary | ICD-10-CM

## 2022-07-08 DIAGNOSIS — I480 Paroxysmal atrial fibrillation: Secondary | ICD-10-CM | POA: Diagnosis not present

## 2022-07-08 DIAGNOSIS — E878 Other disorders of electrolyte and fluid balance, not elsewhere classified: Secondary | ICD-10-CM | POA: Diagnosis not present

## 2022-07-08 DIAGNOSIS — N189 Chronic kidney disease, unspecified: Secondary | ICD-10-CM | POA: Diagnosis not present

## 2022-07-08 DIAGNOSIS — I4811 Longstanding persistent atrial fibrillation: Secondary | ICD-10-CM | POA: Diagnosis not present

## 2022-07-08 LAB — MAGNESIUM: Magnesium: 2.1 mg/dL (ref 1.6–2.3)

## 2022-07-08 LAB — CBC WITH DIFFERENTIAL/PLATELET
Basophils Absolute: 0.1 10*3/uL (ref 0.0–0.2)
Basos: 1 %
EOS (ABSOLUTE): 0.6 10*3/uL — ABNORMAL HIGH (ref 0.0–0.4)
Eos: 5 %
Hematocrit: 38.7 % (ref 37.5–51.0)
Hemoglobin: 12.5 g/dL — ABNORMAL LOW (ref 13.0–17.7)
Immature Grans (Abs): 0.2 10*3/uL — ABNORMAL HIGH (ref 0.0–0.1)
Immature Granulocytes: 2 %
Lymphocytes Absolute: 0.7 10*3/uL (ref 0.7–3.1)
Lymphs: 6 %
MCH: 30.5 pg (ref 26.6–33.0)
MCHC: 32.3 g/dL (ref 31.5–35.7)
MCV: 94 fL (ref 79–97)
Monocytes Absolute: 1.2 10*3/uL — ABNORMAL HIGH (ref 0.1–0.9)
Monocytes: 10 %
Neutrophils Absolute: 9.3 10*3/uL — ABNORMAL HIGH (ref 1.4–7.0)
Neutrophils: 76 %
Platelets: 533 10*3/uL — ABNORMAL HIGH (ref 150–450)
RBC: 4.1 x10E6/uL — ABNORMAL LOW (ref 4.14–5.80)
RDW: 13.2 % (ref 11.6–15.4)
WBC: 12.1 10*3/uL — ABNORMAL HIGH (ref 3.4–10.8)

## 2022-07-08 LAB — CMP14+EGFR
ALT: 29 IU/L (ref 0–44)
AST: 32 IU/L (ref 0–40)
Albumin/Globulin Ratio: 0.9 — ABNORMAL LOW (ref 1.2–2.2)
Albumin: 3.2 g/dL — ABNORMAL LOW (ref 3.8–4.9)
Alkaline Phosphatase: 70 IU/L (ref 44–121)
BUN/Creatinine Ratio: 9 — ABNORMAL LOW (ref 10–24)
BUN: 16 mg/dL (ref 8–27)
Bilirubin Total: 0.5 mg/dL (ref 0.0–1.2)
CO2: 28 mmol/L (ref 20–29)
Calcium: 8.9 mg/dL (ref 8.6–10.2)
Chloride: 99 mmol/L (ref 96–106)
Creatinine, Ser: 1.75 mg/dL — ABNORMAL HIGH (ref 0.76–1.27)
Globulin, Total: 3.5 g/dL (ref 1.5–4.5)
Glucose: 129 mg/dL — ABNORMAL HIGH (ref 70–99)
Potassium: 4.6 mmol/L (ref 3.5–5.2)
Sodium: 138 mmol/L (ref 134–144)
Total Protein: 6.7 g/dL (ref 6.0–8.5)
eGFR: 44 mL/min/{1.73_m2} — ABNORMAL LOW (ref >59)

## 2022-07-08 NOTE — Patient Instructions (Addendum)
[facility] at [facility] [address] [place] Kn[address] [place], [place] [address] [phone]  [facility] at [facility] [address] [place], [place] [address] [phone]  Lovell Sheehan - Diverticulitis follow up  [facility] [address]ella Raring [place], [place] [address] [phone]

## 2022-07-08 NOTE — Progress Notes (Signed)
New Patient Office Visit  Subjective   Patient ID: Bradley Torres, male    DOB: 06-19-1960  Age: 62 y.o. MRN: [id number]  CC:  Chief Complaint  Patient presents with   Hospitalization Follow-up    diverticulitis    HPI ORVILE COTTERILL presents for follow up after hospitalization for diverticulitis with perforation without bleeding. His hospitalization was complicated by Atrial fibrillation with RVR which was treated with amiodarone. 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   During his hospitalization he met criteria for severe sepsis secondary to perforated sigmoid diverticulitis and was treated with IV antibiotics.  Needs repeat CBC/CMP today.   History of Afib previously not requiring rate-control medications. On Xarelto for anticoagulation. Reports that he is taking amiodarone and xarelto as prescribed.   Instructed to resume Hydralazine for Hypertension. Patient is taking hydralizine as prescribed from hospitalization.   Today reports that he does not feel good and is not thinking good. Reports a gas pocket in his abdomen that is "not agreeing with him"  When he went into the hospital felt more pain than he has ever felt in his life. Reports that he does feel better today than when he left the hospital. States that it is slow, but is steadily improving.  Is having bowel movements. Is taking miralax. Reports taking in a lot of water. Reports decreased appetite, but is similar to what it was when he left the hospital.    Outpatient Encounter Medications as of 07/08/2022  Medication Sig   doxycycline (VIBRA-TABS) 100 MG tablet Take 100 mg by mouth daily.   Oxycodone HCl 10 MG TABS Take 10 mg by mouth 4 (four) times daily as needed.   Accu-Chek Softclix Lancets lancets Use as instructed to monitor glucose twice daily    allopurinol (ZYLOPRIM) 100 MG tablet Take 1 tablet (100 mg total) by mouth daily.   amiodarone (PACERONE) 200 MG tablet Take 200 mg twice daily for 3 weeks and then take 200 mg once daily   atorvastatin (LIPITOR) 20 MG tablet Take 1 tablet (20 mg total) by mouth daily.   Cholecalciferol (VITAMIN D3) 250 MCG (10000 UT) capsule Take 10,000 Units by mouth daily.   dapagliflozin propanediol (FARXIGA) 10 MG TABS tablet TAKE ONE TABLET BY MOUTH DAILY BEFORE BREAKFAST   DHEA 25 MG CAPS Take by mouth daily.   docusate sodium (COLACE) 100 MG capsule Take 1 capsule (100 mg total) by mouth 2 (two) times daily.   gabapentin (NEURONTIN) 300 MG capsule Take 1 capsule by mouth daily as needed (pain).   glucose blood (ACCU-CHEK GUIDE) test strip Use as instructed to monitor glucose twice daily   hydrALAZINE (APRESOLINE) 50 MG tablet TAKE ONE TABLET BY MOUTH THREE TIMES DAILY (Patient taking differently: Take 50 mg by mouth daily.)   metFORMIN (GLUCOPHAGE-XR) 750 MG 24 hr tablet Take 2 tablets (1,500 mg total) by mouth daily with breakfast.   oxyCODONE-acetaminophen (PERCOCET) 10-325 MG tablet Take 1 tablet by mouth 3 (three) times daily as needed for pain.   polyethylene glycol (MIRALAX / GLYCOLAX) 17 g packet Take 17 g by mouth daily.   rivaroxaban (XARELTO) 20 MG TABS tablet TAKE ONE TABLET BY MOUTH DAILY WITH SUPPER (Patient taking differently: Take 20 mg by mouth daily.)   senna-docusate (SENOKOT-S) 8.6-50 MG tablet Take 2 tablets by mouth  at bedtime.   sitaGLIPtin (JANUVIA) 100 MG tablet Take 1 tablet (100 mg total) by mouth daily.   testosterone cypionate (DEPOTESTOSTERONE CYPIONATE) 200 MG/ML injection Inject 200 mg into the muscle every 14 (fourteen) days.   No facility-administered encounter medications on file as of 07/08/2022.   Past Medical History:  Diagnosis Date   CAD (coronary artery disease)    a. 12/2017: cath showing 10% Proximal-LAD stenosis with no significant obstructive disease and LVEDP  mildly elevated at 18 mm Hg.    Chronic back pain    Diabetes mellitus, type 2 (HCC)    DVT (deep venous thrombosis) (HCC)    Essential hypertension    MI (myocardial infarction) (HCC) 2019   Morbid obesity (HCC)    Panic disorder    Shoulder pain, left    Following fall   Sleep apnea    Stroke (HCC) 01/2018   Vitamin D deficiency     Past Surgical History:  Procedure Laterality Date   Ankle fracture sugery Left    BIOPSY  07/20/2020   Procedure: BIOPSY;  Surgeon: Corbin Ade, MD;  Location: [facility];  Service: Endoscopy;;  cecal polyp   COLONOSCOPY WITH PROPOFOL N/A 05/29/2017   Dr. Jena Gauss: Multiple tubular adenomas removed, prep inadequate.  Short interval surveillance colonoscopy recommended in 6 months   COLONOSCOPY WITH PROPOFOL N/A 07/20/2020   Procedure: COLONOSCOPY WITH PROPOFOL;  Surgeon: Corbin Ade, MD;  Location: [facility];  Service: Endoscopy;  Laterality: N/A;  am appt, early as possible since diabetic   LEFT HEART CATH AND CORONARY ANGIOGRAPHY N/A 01/23/2018   Procedure: LEFT HEART CATH AND CORONARY ANGIOGRAPHY;  Surgeon: Lennette Bihari, MD;  Location: [facility];  Service: Cardiovascular;  Laterality: N/A;   LUMBAR SPINE SURGERY     fusion   POLYPECTOMY  05/29/2017   Procedure: POLYPECTOMY;  Surgeon: Corbin Ade, MD;  Location: [facility];  Service: Endoscopy;;  ascending colon polyp x5-cs transverse colon polyp x4- cs descending colon polyp x1- hs sigmoid colon polyp x1 -hs rectal polyp x1- hs   TONSILLECTOMY      Family History  Problem Relation Age of Onset   Colon cancer Mother        died at age 47   Heart attack Father    Hepatitis Sister 35       Autoimmune   Heart attack Paternal Grandfather     Social History   Socioeconomic History   Marital status: Single    Spouse name: Not on file   Number of children: Not on file   Years of education: Not on file   Highest education level: Not on file  Occupational History    Occupation: disabled  Tobacco Use   Smoking status: Never   Smokeless tobacco: Never  Vaping Use   Vaping Use: Never used  Substance and Sexual Activity   Alcohol use: Yes    Comment: occ   Drug use: Not Currently    Types: Marijuana    Comment: daily; 05/19/20 no marijuana in past 6 months d/t going to pain management clinic   Sexual activity: Not Currently    Birth control/protection: None  Other Topics Concern   Not on file  Social History Narrative   Not on file   Social Determinants of Health   Financial Resource Strain: Medium Risk (05/18/2020)   Overall Financial Resource Strain (CARDIA)    Difficulty of Paying Living Expenses: Somewhat hard  Food Insecurity:  No Food Insecurity (06/22/2022)   Hunger Vital Sign    Worried About Running Out of Food in the Last Year: Never true    Ran Out of Food in the Last Year: Never true  Transportation Needs: No Transportation Needs (06/22/2022)   PRAPARE - Administrator, Civil Service (Medical): No    Lack of Transportation (Non-Medical): No  Physical Activity: Not on file  Stress: Stress Concern Present (05/15/2019)   [employer] of Occupational Health - Occupational Stress Questionnaire    Feeling of Stress : Rather much  Social Connections: Unknown (01/16/2020)   Social Connection and Isolation Panel [NHANES]    Frequency of Communication with Friends and Family: More than three times a week    Frequency of Social Gatherings with Friends and Family: More than three times a week    Attends Religious Services: Not on file    Active Member of Clubs or Organizations: Not on file    Attends Banker Meetings: Not on file    Marital Status: Not on file  Intimate Partner Violence: Not At Risk (06/22/2022)   Humiliation, Afraid, Rape, and Kick questionnaire    Fear of Current or Ex-Partner: No    Emotionally Abused: No    Physically Abused: No    Sexually Abused: No   ROS As per HPI  Objective        07/08/2022   10:39 AM 06/28/2022    2:22 PM 06/28/2022    9:00 AM  Vitals with BMI  Height     Weight 360 lbs    BMI 48.81    Systolic 125 129 409  Diastolic 72 78 78  Pulse 87 85 74   Oral Temp 98.0   Physical Exam Constitutional:      General: He is not in acute distress.    Appearance: He is obese. He is ill-appearing.  Cardiovascular:     Rate and Rhythm: Normal rate and regular rhythm.     Pulses: Normal pulses.  Pulmonary:     Effort: Pulmonary effort is normal. No tachypnea, bradypnea, accessory muscle usage, prolonged expiration, respiratory distress or retractions.     Breath sounds: Examination of the right-middle field reveals wheezing. Examination of the right-lower field reveals wheezing. Wheezing present.     Comments: Wheeze cleared with cough Abdominal:     General: Bowel sounds are normal.     Tenderness: There is abdominal tenderness.  Neurological:     Mental Status: He is alert.  Psychiatric:        Attention and Perception: Attention and perception normal.        Mood and Affect: Mood and affect normal.        Speech: Speech normal.        Behavior: Behavior is cooperative.        Thought Content: Thought content normal.        Cognition and Memory: Cognition and memory normal.        Judgment: Judgment normal.   Assessment & Plan:  1. Diverticulitis of large intestine with perforation without bleeding Referral placed for patient to follow up outpatient with Dr. Lovell Sheehan per discharge instructions. Labs as below. Will communicate results to patient once available. Patient continues on 30 day supply of doxycycline. Followed by Dr. Jena Gauss for Gastroenterology, will cc him on chart for follow up.  - Ambulatory referral to General Surgery - CMP14+EGFR - CBC with Differential/Platelet - Magnesium  2. Paroxysmal atrial fibrillation Patient  has history of atrial fibrillation and was on Xarelto. During hospitalization was transitioned to Lovenox, then  transitioned back to Xarelto at discharge. Previously did not require rate control. During hospitalization was in Afib RVR and loaded with Amiodarone. Managed by Dr. Wyline Mood. Referral placed for patient to follow with Dr. Wyline Mood. He is continuing to take xarelto and amiodarone since discharge.  - Ambulatory referral to Cardiology - CMP14+EGFR - CBC with Differential/Platelet - Magnesium  3. Wheeze Wheeze on exam today that cleared with cough and deep breathing. Provided education on the importance of deep breathing and coughing.   4. Acute kidney injury superimposed on chronic kidney disease Labs as above. Will monitor in one week with repeat labs to evaluate for recovery of renal function.   5. Electrolyte imbalance Labs as above. Patient required supplementation of electrolytes during hospitalization. Will monitor in one week with repeat labs.    Patient declined PT/OT follow up. Offered and discussed the purpose of PT and OT at length with patient and he declined both home health and ambulatory referrals for therapy.   The above assessment and management plan was discussed with the patient. The patient verbalized understanding of and has agreed to the management plan using shared-decision making. Patient is aware to call the clinic if they develop any new symptoms or if symptoms fail to improve or worsen. Patient is aware when to return to the clinic for a follow-up visit. Patient educated on when it is appropriate to go to the emergency department. Discussed with patient to have a low threshold for return to ED.   Neale Burly, DNP-[name] [facility] [address] [place], [place] [address] [phone]

## 2022-07-13 ENCOUNTER — Other Ambulatory Visit: Payer: Self-pay | Admitting: *Deleted

## 2022-07-13 DIAGNOSIS — E878 Other disorders of electrolyte and fluid balance, not elsewhere classified: Secondary | ICD-10-CM

## 2022-07-13 DIAGNOSIS — R7989 Other specified abnormal findings of blood chemistry: Secondary | ICD-10-CM

## 2022-07-14 ENCOUNTER — Encounter: Payer: Medicaid Other | Admitting: Family Medicine

## 2022-07-14 ENCOUNTER — Other Ambulatory Visit: Payer: Medicaid Other

## 2022-07-14 DIAGNOSIS — R7989 Other specified abnormal findings of blood chemistry: Secondary | ICD-10-CM | POA: Diagnosis not present

## 2022-07-14 DIAGNOSIS — E878 Other disorders of electrolyte and fluid balance, not elsewhere classified: Secondary | ICD-10-CM | POA: Diagnosis not present

## 2022-07-14 NOTE — Addendum Note (Signed)
Addended by: Neale Burly on: 07/14/2022 12:59 PM   Modules accepted: Level of Service

## 2022-07-14 NOTE — Addendum Note (Signed)
Addended by: Neale Burly on: 07/14/2022 08:04 AM   Modules accepted: Orders

## 2022-07-15 LAB — CBC WITH DIFFERENTIAL/PLATELET
Basophils Absolute: 0.2 10*3/uL (ref 0.0–0.2)
Basos: 1 %
EOS (ABSOLUTE): 0.2 10*3/uL (ref 0.0–0.4)
Eos: 1 %
Hematocrit: 39.1 % (ref 37.5–51.0)
Hemoglobin: 13 g/dL (ref 13.0–17.7)
Immature Grans (Abs): 0.2 10*3/uL — ABNORMAL HIGH (ref 0.0–0.1)
Immature Granulocytes: 1 %
Lymphocytes Absolute: 0.7 10*3/uL (ref 0.7–3.1)
Lymphs: 4 %
MCH: 30.2 pg (ref 26.6–33.0)
MCHC: 33.2 g/dL (ref 31.5–35.7)
MCV: 91 fL (ref 79–97)
Monocytes Absolute: 1.2 10*3/uL — ABNORMAL HIGH (ref 0.1–0.9)
Monocytes: 7 %
Neutrophils Absolute: 14.3 10*3/uL — ABNORMAL HIGH (ref 1.4–7.0)
Neutrophils: 86 %
Platelets: 450 10*3/uL (ref 150–450)
RBC: 4.31 x10E6/uL (ref 4.14–5.80)
RDW: 13.5 % (ref 11.6–15.4)
WBC: 16.7 10*3/uL — ABNORMAL HIGH (ref 3.4–10.8)

## 2022-07-15 LAB — BMP8+EGFR
BUN/Creatinine Ratio: 9 — ABNORMAL LOW (ref 10–24)
BUN: 14 mg/dL (ref 8–27)
CO2: 21 mmol/L (ref 20–29)
Calcium: 9.1 mg/dL (ref 8.6–10.2)
Chloride: 95 mmol/L — ABNORMAL LOW (ref 96–106)
Creatinine, Ser: 1.55 mg/dL — ABNORMAL HIGH (ref 0.76–1.27)
Glucose: 130 mg/dL — ABNORMAL HIGH (ref 70–99)
Potassium: 4.5 mmol/L (ref 3.5–5.2)
Sodium: 136 mmol/L (ref 134–144)
eGFR: 51 mL/min/{1.73_m2} — ABNORMAL LOW (ref >59)

## 2022-07-19 ENCOUNTER — Ambulatory Visit: Payer: Medicaid Other | Admitting: Family Medicine

## 2022-07-19 ENCOUNTER — Encounter: Payer: Self-pay | Admitting: Family Medicine

## 2022-07-19 VITALS — BP 135/89 | HR 90 | Temp 97.1°F | Ht 72.0 in | Wt 347.0 lb

## 2022-07-19 DIAGNOSIS — N183 Chronic kidney disease, stage 3 unspecified: Secondary | ICD-10-CM | POA: Diagnosis not present

## 2022-07-19 DIAGNOSIS — E1165 Type 2 diabetes mellitus with hyperglycemia: Secondary | ICD-10-CM

## 2022-07-19 DIAGNOSIS — I2692 Saddle embolus of pulmonary artery without acute cor pulmonale: Secondary | ICD-10-CM

## 2022-07-19 DIAGNOSIS — G4733 Obstructive sleep apnea (adult) (pediatric): Secondary | ICD-10-CM | POA: Diagnosis not present

## 2022-07-19 DIAGNOSIS — I480 Paroxysmal atrial fibrillation: Secondary | ICD-10-CM

## 2022-07-19 DIAGNOSIS — K5720 Diverticulitis of large intestine with perforation and abscess without bleeding: Secondary | ICD-10-CM

## 2022-07-19 DIAGNOSIS — I2782 Chronic pulmonary embolism: Secondary | ICD-10-CM | POA: Diagnosis not present

## 2022-07-19 NOTE — Progress Notes (Signed)
Subjective:  Patient ID: Bradley Torres, male    DOB: 04-14-1960  Age: 62 y.o. MRN: [id number]  CC: Medical Management of Chronic Issues   HPI Bradley Torres presents for recent hospitalization for perforated diverticulitis. He developed sepsis during the stay. He was managed without surgical intervention. Still weak, but feeling some better. Feels like there is a big bubble of gas in the abdomen. Pain has eased. Currently pain remains 5/10 At presentation was 10+/10. Seen for post hospital TOC las week by Jerrel Ivory. Blood work, report reviewed. Hospital DC also reviewed. Appetite is returning. No hematochezia or melena currently.  Denies high or low glucose since DC.  Pt. Now taking NOAC for P.E. as well as new onset a. Fib during hospital stay. Not dyspneic. No palpitations.   Couldn't tolerate CPAP. Sent it back. Declines further work up. Saw Dr. Doren Custard 3 years ago.      07/19/2022    2:46 PM 07/08/2022   10:43 AM 05/11/2022    1:24 PM  Depression screen PHQ 2/9  Decreased Interest 0 0 0  Down, Depressed, Hopeless 0 0 0  PHQ - 2 Score 0 0 0  Altered sleeping  3   Tired, decreased energy  3   Change in appetite  0   Feeling bad or failure about yourself   0   Trouble concentrating  1   Moving slowly or fidgety/restless  0   Suicidal thoughts  0   PHQ-9 Score  7     History Bradley Torres has a past medical history of CAD (coronary artery disease), Chronic back pain, Diabetes mellitus, type 2, DVT (deep venous thrombosis), Essential hypertension, MI (myocardial infarction) (2019), Morbid obesity, Panic disorder, Shoulder pain, left, Sleep apnea, Stroke (01/2018), and Vitamin D deficiency.   He has a past surgical history that includes Lumbar spine surgery; Ankle fracture sugery (Left); Tonsillectomy; Colonoscopy with propofol (N/A, 05/29/2017); polypectomy (05/29/2017); LEFT HEART CATH AND CORONARY ANGIOGRAPHY (N/A, 01/23/2018); Colonoscopy with propofol (N/A, 07/20/2020); and biopsy  (07/20/2020).   His family history includes Colon cancer in his mother; Heart attack in his father and paternal grandfather; Hepatitis (age of onset: 88) in his sister.He reports that he has never smoked. He has never used smokeless tobacco. He reports current alcohol use. He reports that he does not currently use drugs after having used the following drugs: Marijuana.    ROS Review of Systems  Constitutional:  Negative for chills, diaphoresis, fever and unexpected weight change.  HENT:  Negative for rhinorrhea and trouble swallowing.   Respiratory:  Negative for cough, chest tightness and shortness of breath.   Cardiovascular:  Negative for chest pain.  Gastrointestinal:  Positive for abdominal pain. Negative for abdominal distention, blood in stool, constipation, diarrhea, nausea, rectal pain and vomiting.  Genitourinary:  Negative for dysuria, flank pain and hematuria.  Musculoskeletal:  Negative for arthralgias and joint swelling.  Skin:  Negative for rash.  Neurological:  Negative for syncope and headaches.    Objective:  BP 135/89   Pulse 90   Temp (!) 97.1 F (36.2 C)   Ht 6' (1.829 m)   Wt (!) 347 lb (157.4 kg)   SpO2 92%   BMI 47.06 kg/m   BP Readings from Last 3 Encounters:  07/19/22 135/89  07/08/22 125/72  06/28/22 129/78    Wt Readings from Last 3 Encounters:  07/19/22 (!) 347 lb (157.4 kg)  07/08/22 (!) 360 lb (163.3 kg)  06/25/22 (!) 382 lb 4.4 oz (173.4  kg)     Physical Exam Constitutional:      General: He is not in acute distress.    Appearance: He is well-developed. He is obese. He is ill-appearing.  HENT:     Head: Normocephalic and atraumatic.     Right Ear: External ear normal.     Left Ear: External ear normal.     Nose: Nose normal.  Eyes:     Conjunctiva/sclera: Conjunctivae normal.     Pupils: Pupils are equal, round, and reactive to light.  Cardiovascular:     Rate and Rhythm: Normal rate and regular rhythm.     Heart sounds: Normal  heart sounds. No murmur heard. Pulmonary:     Effort: Pulmonary effort is normal. No respiratory distress.     Breath sounds: Normal breath sounds. No wheezing or rales.  Abdominal:     Palpations: Abdomen is soft.     Tenderness: There is abdominal tenderness. There is no guarding or rebound.  Musculoskeletal:        General: Normal range of motion.     Cervical back: Normal range of motion and neck supple.  Skin:    General: Skin is warm and dry.  Neurological:     Mental Status: He is alert and oriented to person, place, and time.     Deep Tendon Reflexes: Reflexes are normal and symmetric.  Psychiatric:        Behavior: Behavior normal.        Thought Content: Thought content normal.        Judgment: Judgment normal.       Assessment & Plan:   Bradley Torres was seen today for medical management of chronic issues.  Diagnoses and all orders for this visit:  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  Stage 3 chronic kidney disease, unspecified whether stage 3a or 3b CKD  Diverticulitis of large intestine with perforation without bleeding -     CT Abdomen Pelvis W Contrast; Future  Morbid (severe) obesity due to excess calories  Paroxysmal atrial fibrillation  Severe obstructive sleep apnea-hypopnea syndrome  Type 2 diabetes mellitus with hyperglycemia, without long-term current use of insulin       I am having Bradley Torres maintain his gabapentin, testosterone cypionate, atorvastatin, Vitamin D3, DHEA, Accu-Chek Softclix Lancets, dapagliflozin propanediol, Accu-Chek Guide, sitaGLIPtin, hydrALAZINE, allopurinol, rivaroxaban, metFORMIN, oxyCODONE-acetaminophen, amiodarone, docusate sodium, polyethylene glycol, senna-docusate, Oxycodone HCl, and doxycycline.  Allergies as of 07/19/2022       Reactions   Actos [pioglitazone] Cough   Some dyspnea & edema ( possibly some CHF?)   Lisinopril Other (See Comments)   wheezing   Bystolic [nebivolol Hcl]  Swelling   Ibuprofen Other (See Comments)   Rectal bleed   Spironolactone Hives   Trulicity [dulaglutide]    Sob, dry cough, GI   Amlodipine Itching, Other (See Comments)   Leg pain.   Diflucan [fluconazole] Rash   Hctz [hydrochlorothiazide] Other (See Comments)   Caused gout flares.   Losartan Rash        Medication List        Accurate as of July 19, 2022  6:36 PM. If you have any questions, ask your nurse or doctor.          Accu-Chek Guide test strip Generic drug: glucose blood Use as instructed to monitor glucose twice daily   Accu-Chek Softclix Lancets lancets Use as instructed to monitor glucose twice daily   allopurinol 100 MG tablet Commonly known as: ZYLOPRIM  Take 1 tablet (100 mg total) by mouth daily.   amiodarone 200 MG tablet Commonly known as: PACERONE Take 200 mg twice daily for 3 weeks and then take 200 mg once daily   atorvastatin 20 MG tablet Commonly known as: LIPITOR Take 1 tablet (20 mg total) by mouth daily.   dapagliflozin propanediol 10 MG Tabs tablet Commonly known as: Farxiga TAKE ONE TABLET BY MOUTH DAILY BEFORE BREAKFAST   DHEA 25 MG Caps Take by mouth daily.   docusate sodium 100 MG capsule Commonly known as: COLACE Take 1 capsule (100 mg total) by mouth 2 (two) times daily.   doxycycline 100 MG tablet Commonly known as: VIBRA-TABS Take 100 mg by mouth daily.   gabapentin 300 MG capsule Commonly known as: NEURONTIN Take 1 capsule by mouth daily as needed (pain).   hydrALAZINE 50 MG tablet Commonly known as: APRESOLINE TAKE ONE TABLET BY MOUTH THREE TIMES DAILY What changed: when to take this   metFORMIN 750 MG 24 hr tablet Commonly known as: GLUCOPHAGE-XR Take 2 tablets (1,500 mg total) by mouth daily with breakfast.   Oxycodone HCl 10 MG Tabs Take 10 mg by mouth 4 (four) times daily as needed.   oxyCODONE-acetaminophen 10-325 MG tablet Commonly known as: PERCOCET Take 1 tablet by mouth 3 (three) times daily  as needed for pain.   polyethylene glycol 17 g packet Commonly known as: MIRALAX / GLYCOLAX Take 17 g by mouth daily.   rivaroxaban 20 MG Tabs tablet Commonly known as: Xarelto TAKE ONE TABLET BY MOUTH DAILY WITH SUPPER What changed:  how much to take how to take this when to take this additional instructions   senna-docusate 8.6-50 MG tablet Commonly known as: Senokot-S Take 2 tablets by mouth at bedtime.   sitaGLIPtin 100 MG tablet Commonly known as: Januvia Take 1 tablet (100 mg total) by mouth daily.   testosterone cypionate 200 MG/ML injection Commonly known as: DEPOTESTOSTERONE CYPIONATE Inject 200 mg into the muscle every 14 (fourteen) days.   Vitamin D3 250 MCG (10000 UT) capsule Take 10,000 Units by mouth daily.         Follow-up: Return in about 2 weeks (around 08/02/2022).  Mechele Claude, M.D.

## 2022-07-25 ENCOUNTER — Ambulatory Visit: Payer: Medicaid Other | Attending: Nurse Practitioner | Admitting: Nurse Practitioner

## 2022-07-25 ENCOUNTER — Encounter: Payer: Self-pay | Admitting: Nurse Practitioner

## 2022-07-25 VITALS — BP 160/100 | HR 90 | Ht 72.0 in | Wt 350.6 lb

## 2022-07-25 DIAGNOSIS — I10 Essential (primary) hypertension: Secondary | ICD-10-CM

## 2022-07-25 DIAGNOSIS — Z8673 Personal history of transient ischemic attack (TIA), and cerebral infarction without residual deficits: Secondary | ICD-10-CM | POA: Diagnosis not present

## 2022-07-25 DIAGNOSIS — I480 Paroxysmal atrial fibrillation: Secondary | ICD-10-CM

## 2022-07-25 DIAGNOSIS — Z86711 Personal history of pulmonary embolism: Secondary | ICD-10-CM

## 2022-07-25 DIAGNOSIS — I2510 Atherosclerotic heart disease of native coronary artery without angina pectoris: Secondary | ICD-10-CM | POA: Diagnosis not present

## 2022-07-25 DIAGNOSIS — Z86718 Personal history of other venous thrombosis and embolism: Secondary | ICD-10-CM | POA: Diagnosis not present

## 2022-07-25 DIAGNOSIS — Z8719 Personal history of other diseases of the digestive system: Secondary | ICD-10-CM

## 2022-07-25 DIAGNOSIS — I272 Pulmonary hypertension, unspecified: Secondary | ICD-10-CM

## 2022-07-25 DIAGNOSIS — R9389 Abnormal findings on diagnostic imaging of other specified body structures: Secondary | ICD-10-CM

## 2022-07-25 DIAGNOSIS — Z8619 Personal history of other infectious and parasitic diseases: Secondary | ICD-10-CM

## 2022-07-25 DIAGNOSIS — R5383 Other fatigue: Secondary | ICD-10-CM

## 2022-07-25 DIAGNOSIS — R0609 Other forms of dyspnea: Secondary | ICD-10-CM | POA: Diagnosis not present

## 2022-07-25 DIAGNOSIS — D6859 Other primary thrombophilia: Secondary | ICD-10-CM | POA: Diagnosis not present

## 2022-07-25 DIAGNOSIS — G4733 Obstructive sleep apnea (adult) (pediatric): Secondary | ICD-10-CM

## 2022-07-25 NOTE — Patient Instructions (Signed)
Medication Instructions:  Your physician recommends that you continue on your current medications as directed. Please refer to the Current Medication list given to you today.   Labwork: none  Testing/Procedures: none  Follow-Up:  Your physician recommends that you schedule a follow-up appointment in: 6 weeks  Any Other Special Instructions Will Be Listed Below (If Applicable).  You have been referred to Pulmonology  If you need a refill on your cardiac medications before your next appointment, please call your pharmacy.

## 2022-07-25 NOTE — Progress Notes (Signed)
Office Visit    Patient Name: Bradley Torres Date of Encounter: 07/25/2022  PCP:  Mechele Claude, MD   [facility]  Cardiologist:  Dina Rich, MD  Advanced Practice Provider:  No care team member to display Electrophysiologist:  None   Chief Complaint    Bradley Torres is a 62 y.o. male with a hx of CAD, history of chest pain, palpitations, hypertension, pulmonary hypertension/history of DVT, history of PE, hx of CVA, OSA, and history of PAF, who presents today for hospital follow-up.  Past Medical History    Past Medical History:  Diagnosis Date   CAD (coronary artery disease)    a. 12/2017: cath showing 10% Proximal-LAD stenosis with no significant obstructive disease and LVEDP mildly elevated at 18 mm Hg.    Chronic back pain    Diabetes mellitus, type 2 (HCC)    DVT (deep venous thrombosis) (HCC)    Essential hypertension    MI (myocardial infarction) (HCC) 2019   Morbid obesity (HCC)    Panic disorder    Shoulder pain, left    Following fall   Sleep apnea    Stroke (HCC) 01/2018   Vitamin D deficiency    Past Surgical History:  Procedure Laterality Date   Ankle fracture sugery Left    BIOPSY  07/20/2020   Procedure: BIOPSY;  Surgeon: Corbin Ade, MD;  Location: [facility];  Service: Endoscopy;;  cecal polyp   COLONOSCOPY WITH PROPOFOL N/A 05/29/2017   Dr. Jena Gauss: Multiple tubular adenomas removed, prep inadequate.  Short interval surveillance colonoscopy recommended in 6 months   COLONOSCOPY WITH PROPOFOL N/A 07/20/2020   Procedure: COLONOSCOPY WITH PROPOFOL;  Surgeon: Corbin Ade, MD;  Location: [facility];  Service: Endoscopy;  Laterality: N/A;  am appt, early as possible since diabetic   LEFT HEART CATH AND CORONARY ANGIOGRAPHY N/A 01/23/2018   Procedure: LEFT HEART CATH AND CORONARY ANGIOGRAPHY;  Surgeon: Lennette Bihari, MD;  Location: [facility];  Service: Cardiovascular;  Laterality: N/A;   LUMBAR SPINE  SURGERY     fusion   POLYPECTOMY  05/29/2017   Procedure: POLYPECTOMY;  Surgeon: Corbin Ade, MD;  Location: [facility];  Service: Endoscopy;;  ascending colon polyp x5-cs transverse colon polyp x4- cs descending colon polyp x1- hs sigmoid colon polyp x1 -hs rectal polyp x1- hs   TONSILLECTOMY      Allergies  Allergies  Allergen Reactions   Actos [Pioglitazone] Cough    Some dyspnea & edema ( possibly some CHF?)   Lisinopril Other (See Comments)    wheezing   Bystolic [Nebivolol Hcl] Swelling   Ibuprofen Other (See Comments)    Rectal bleed   Spironolactone Hives   Trulicity [Dulaglutide]     Sob, dry cough, GI   Amlodipine Itching and Other (See Comments)    Leg pain.   Diflucan [Fluconazole] Rash   Hctz [Hydrochlorothiazide] Other (See Comments)    Caused gout flares.   Losartan Rash    History of Present Illness    Bradley Torres is a 62 y.o. male with a PMH as mentioned above.  Last seen by Dr. Dina Rich on April 28, 2022.  Blood pressure was at goal.  Was doing well from a cardiac perspective.  Was tolerating Xarelto well.  In the interim, he was admitted March 2024 due to abdominal pain, treated for severe sepsis secondary to perforated sigmoid diverticulitis, went into A-fib with RVR during  hospitalization, received IV amiodarone and continue to have high heart rate.  Was converted over to oral amiodarone did convert to sinus rhythm.  Echocardiogram showed normal systolic function, no significant valvular disease.  Cardiology recommended amiodarone 200 mg twice daily x 3 weeks, followed by 200 mg once daily.  Today he presents for hospital follow-up.  He states he is not feeling well.  States some of the medicines he is on after discharge not making him feel well.  Difficult to describe.  Admits to feeling "sluggish," low energy, and endorses stable DOE. Denies any chest pain, palpitations, syncope, presyncope, dizziness, orthopnea, PND, significant  weight changes, acute bleeding, or claudication.  Admits to swelling noticed along lower extremities bilaterally, more noticed since leaving the hospital.  EKGs/Labs/Other Studies Reviewed:   The following studies were reviewed today:   EKG:  EKG is not ordered today.    Echo 05/2022: 1. Left ventricular ejection fraction, by estimation, is 50 to 55% -  difficuly evaluation in the setting of rapid atrial fibrillation and  variable contraction from beat to beat. There is intermittent septal  bounce in association with respiration. The  left ventricle has low normal function. The left ventricle demonstrates  global hypokinesis. Left ventricular diastolic parameters are  indeterminate.   2. Right ventricular systolic function is moderately reduced. The right  ventricular size is mildly enlarged. There is mildly elevated pulmonary  artery systolic pressure. The estimated right ventricular systolic  pressure is 42.2 mmHg.   3. Left atrial size was mildly dilated.   4. The mitral valve is grossly normal. Trivial mitral valve  regurgitation.   5. The aortic valve is tricuspid. Aortic valve regurgitation is not  visualized.   6. The inferior vena cava is dilated in size with <50% respiratory  variability, suggesting right atrial pressure of 15 mmHg.   Comparison(s): Prior images reviewed side by side. Difficult to compare  since patient currently in rapid atrial fibrillation. Consider follow-up  limited study once back in sinus rhythm.  Left heart cath 12/2017: Prox LAD lesion is 10% stenosed. The left ventricular ejection fraction is 50-55% by visual estimate. LV end diastolic pressure is mildly elevated. The left ventricular systolic function is normal.   No significant coronary obstructive disease with essentially normal coronary arteries and only mild luminal irregularity of the proximal LAD of 10%; normal left circumflex and normal dominant RCA.   Low normal global LV function  with an ejection fraction of 50 to 55% without definitive focal segmental wall motion abnormalities.  LVEDP is 18 mmHg.   RECOMMENDATION: Medical therapy.  Weight loss is essential.  The patient should be evaluated for obstructive sleep apnea.   No indication for antiplatelet therapy at this time.  Recent Labs: 06/21/2022: TSH 6.514 07/08/2022: ALT 29; Magnesium 2.1 07/14/2022: BUN 14; Creatinine, Ser 1.55; Hemoglobin 13.0; Platelets 450; Potassium 4.5; Sodium 136  Recent Lipid Panel    Component Value Date/Time   CHOL 133 05/11/2022 1436   TRIG 118 05/11/2022 1436   HDL 36 (L) 05/11/2022 1436   CHOLHDL 3.7 05/11/2022 1436   CHOLHDL 5.1 01/20/2018 0209   VLDL 26 01/20/2018 0209   LDLCALC 76 05/11/2022 1436    Risk Assessment/Calculations:   CHA2DS2-VASc Score = 4  This indicates a 4.8% annual risk of stroke. The patient's score is based upon: CHF History: 0 HTN History: 1 Diabetes History: 0 Stroke History: 2 Vascular Disease History: 1 Age Score: 0 Gender Score: 0   Home Medications  Current Meds  Medication Sig   Accu-Chek Softclix Lancets lancets Use as instructed to monitor glucose twice daily   allopurinol (ZYLOPRIM) 100 MG tablet Take 1 tablet (100 mg total) by mouth daily.   amiodarone (PACERONE) 200 MG tablet Take 200 mg twice daily for 3 weeks and then take 200 mg once daily   atorvastatin (LIPITOR) 20 MG tablet Take 1 tablet (20 mg total) by mouth daily.   Cholecalciferol (VITAMIN D3) 250 MCG (10000 UT) capsule Take 10,000 Units by mouth daily.   dapagliflozin propanediol (FARXIGA) 10 MG TABS tablet TAKE ONE TABLET BY MOUTH DAILY BEFORE BREAKFAST   DHEA 25 MG CAPS Take by mouth daily.   docusate sodium (COLACE) 100 MG capsule Take 1 capsule (100 mg total) by mouth 2 (two) times daily.   doxycycline (VIBRA-TABS) 100 MG tablet Take 100 mg by mouth daily.   gabapentin (NEURONTIN) 300 MG capsule Take 1 capsule by mouth daily as needed (pain).   glucose blood  (ACCU-CHEK GUIDE) test strip Use as instructed to monitor glucose twice daily   hydrALAZINE (APRESOLINE) 50 MG tablet TAKE ONE TABLET BY MOUTH THREE TIMES DAILY (Patient taking differently: Take 50 mg by mouth daily.)   metFORMIN (GLUCOPHAGE-XR) 750 MG 24 hr tablet Take 2 tablets (1,500 mg total) by mouth daily with breakfast.   Oxycodone HCl 10 MG TABS Take 10 mg by mouth 4 (four) times daily as needed.   oxyCODONE-acetaminophen (PERCOCET) 10-325 MG tablet Take 1 tablet by mouth 3 (three) times daily as needed for pain.   polyethylene glycol (MIRALAX / GLYCOLAX) 17 g packet Take 17 g by mouth daily.   rivaroxaban (XARELTO) 20 MG TABS tablet TAKE ONE TABLET BY MOUTH DAILY WITH SUPPER (Patient taking differently: Take 20 mg by mouth daily.)   senna-docusate (SENOKOT-S) 8.6-50 MG tablet Take 2 tablets by mouth at bedtime.   sitaGLIPtin (JANUVIA) 100 MG tablet Take 1 tablet (100 mg total) by mouth daily.   testosterone cypionate (DEPOTESTOSTERONE CYPIONATE) 200 MG/ML injection Inject 200 mg into the muscle every 14 (fourteen) days.     Review of Systems    All other systems reviewed and are otherwise negative except as noted above.  Physical Exam    VS:  BP (!) 160/100 (BP Location: Right Arm, Patient Position: Sitting, Cuff Size: Large)   Pulse 90   Ht 6' (1.829 m)   Wt (!) 350 lb 9.6 oz (159 kg)   SpO2 96%   BMI 47.55 kg/m  , BMI Body mass index is 47.55 kg/m.  Wt Readings from Last 3 Encounters:  07/25/22 (!) 350 lb 9.6 oz (159 kg)  07/19/22 (!) 347 lb (157.4 kg)  07/08/22 (!) 360 lb (163.3 kg)     GEN: Morbidly obese, 62 y.o. male in no acute distress. HEENT: normal. Neck: Supple, no JVD, carotid bruits, or masses. Cardiac: S1/S2, RRR, no murmurs, rubs, or gallops. No clubbing, cyanosis, edema.  Radials2+ /PT 1+ and equal bilaterally.  Respiratory:  Respirations regular and unlabored, clear to auscultation bilaterally. MS: No deformity or atrophy. Skin: Warm and dry, no  rash. Neuro:  Strength and sensation are intact. Psych: Normal affect.  Assessment & Plan    PAF, long term anticoagulation, hypercoagulable state (history of DVT/PE) CAD HTN DOE, pulmonary hypertension, OSA (not on CPAP) Hx of CVA Fatigue Hx of sepsis secdondary to perforated sigmoid diverticulitis CT scan - defers,  Defers bldod work  {Are you ordering a CV Procedure (e.g. stress test, cath, DCCV, TEE, etc)?  Press F2        :[id number]}      Disposition: Follow up {follow up:15908} with Dina Rich, MD or APP.  Signed, Sharlene Dory, NP 07/25/2022, 2:43 PM [facility]

## 2022-07-27 ENCOUNTER — Telehealth: Payer: Self-pay | Admitting: *Deleted

## 2022-07-27 NOTE — Telephone Encounter (Signed)
Sharlene Dory, NP  Bradley Moore, RN Discussed case with Dr. Wyline Mood. Please call patient and let him know he is okay to stop Amiodarone.  Thanks!  Best, Sharlene Dory, NP

## 2022-07-27 NOTE — Telephone Encounter (Signed)
Patient informed and verbalized understanding of plan. 

## 2022-07-31 ENCOUNTER — Ambulatory Visit (HOSPITAL_BASED_OUTPATIENT_CLINIC_OR_DEPARTMENT_OTHER): Admission: RE | Admit: 2022-07-31 | Payer: Medicaid Other | Source: Ambulatory Visit

## 2022-08-01 ENCOUNTER — Other Ambulatory Visit (HOSPITAL_BASED_OUTPATIENT_CLINIC_OR_DEPARTMENT_OTHER): Payer: Self-pay | Admitting: Cardiovascular Disease

## 2022-08-01 DIAGNOSIS — I252 Old myocardial infarction: Secondary | ICD-10-CM

## 2022-08-01 DIAGNOSIS — E1165 Type 2 diabetes mellitus with hyperglycemia: Secondary | ICD-10-CM

## 2022-08-01 NOTE — Telephone Encounter (Signed)
Patient of Dr. J. Branch. Please review for refill. Thank you!   

## 2022-08-02 ENCOUNTER — Encounter: Payer: Self-pay | Admitting: Family Medicine

## 2022-08-02 ENCOUNTER — Ambulatory Visit: Payer: Medicaid Other | Admitting: Family Medicine

## 2022-08-02 ENCOUNTER — Ambulatory Visit: Payer: Medicaid Other | Admitting: General Surgery

## 2022-08-02 VITALS — BP 107/71 | HR 100 | Temp 97.9°F | Ht 72.0 in | Wt 342.0 lb

## 2022-08-02 DIAGNOSIS — N183 Chronic kidney disease, stage 3 unspecified: Secondary | ICD-10-CM | POA: Diagnosis not present

## 2022-08-02 DIAGNOSIS — Z7984 Long term (current) use of oral hypoglycemic drugs: Secondary | ICD-10-CM | POA: Diagnosis not present

## 2022-08-02 DIAGNOSIS — E785 Hyperlipidemia, unspecified: Secondary | ICD-10-CM

## 2022-08-02 DIAGNOSIS — I4891 Unspecified atrial fibrillation: Secondary | ICD-10-CM | POA: Diagnosis not present

## 2022-08-02 DIAGNOSIS — I152 Hypertension secondary to endocrine disorders: Secondary | ICD-10-CM | POA: Diagnosis not present

## 2022-08-02 DIAGNOSIS — E1159 Type 2 diabetes mellitus with other circulatory complications: Secondary | ICD-10-CM

## 2022-08-02 DIAGNOSIS — E1169 Type 2 diabetes mellitus with other specified complication: Secondary | ICD-10-CM

## 2022-08-02 DIAGNOSIS — Z7901 Long term (current) use of anticoagulants: Secondary | ICD-10-CM | POA: Diagnosis not present

## 2022-08-02 MED ORDER — METRONIDAZOLE 500 MG PO TABS: 500.0000 mg | ORAL_TABLET | Freq: Two times a day (BID) | ORAL | 0 refills | Status: DC

## 2022-08-02 MED ORDER — CIPROFLOXACIN HCL 500 MG PO TABS: 500.0000 mg | ORAL_TABLET | Freq: Two times a day (BID) | ORAL | 0 refills | Status: DC

## 2022-08-02 NOTE — Progress Notes (Signed)
Subjective:  Patient ID: Bradley Torres, male    DOB: 1960-08-31  Age: 62 y.o. MRN: [id number]  CC: Follow-up   HPI Bradley Torres presents for diverticulitis follow up. Also having boils in his groin. Air pocket is almost gone.   Still having pain in a band across the mid abdomen. Not sharp. Dull. Denies cramps. Denies diarrhea and hematochezia.     08/02/2022    3:11 PM 07/19/2022    2:46 PM 07/08/2022   10:43 AM  Depression screen PHQ 2/9  Decreased Interest 0 0 0  Down, Depressed, Hopeless 0 0 0  PHQ - 2 Score 0 0 0  Altered sleeping   3  Tired, decreased energy   3  Change in appetite   0  Feeling bad or failure about yourself    0  Trouble concentrating   1  Moving slowly or fidgety/restless   0  Suicidal thoughts   0  PHQ-9 Score   7    History Bradley Torres has a past medical history of CAD (coronary artery disease), Chronic back pain, Diabetes mellitus, type 2 (HCC), DVT (deep venous thrombosis) (HCC), Essential hypertension, Family hx of colon cancer (05/03/2017), MI (myocardial infarction) (HCC) (2019), Morbid obesity (HCC), Panic disorder, Shoulder pain, left, Sleep apnea, Stroke (HCC) (01/2018), and Vitamin D deficiency.   He has a past surgical history that includes Lumbar spine surgery; Ankle fracture sugery (Left); Tonsillectomy; Colonoscopy with propofol (N/A, 05/29/2017); polypectomy (05/29/2017); LEFT HEART CATH AND CORONARY ANGIOGRAPHY (N/A, 01/23/2018); Colonoscopy with propofol (N/A, 07/20/2020); and biopsy (07/20/2020).   His family history includes Colon cancer in his mother; Heart attack in his father and paternal grandfather; Hepatitis (age of onset: 17) in his sister.He reports that he has never smoked. He has never been exposed to tobacco smoke. He has never used smokeless tobacco. He reports current alcohol use. He reports that he does not currently use drugs after having used the following drugs: Marijuana.    ROS Review of Systems  Constitutional:  Negative  for fever.  Respiratory:  Negative for shortness of breath.   Cardiovascular:  Negative for chest pain.  Musculoskeletal:  Positive for back pain (right angle of scapula). Negative for arthralgias.  Skin:  Negative for rash.    Objective:  BP 107/71   Pulse 100   Temp 97.9 F (36.6 C)   Ht 6' (1.829 m)   Wt (!) 342 lb (155.1 kg)   SpO2 92%   BMI 46.38 kg/m   BP Readings from Last 3 Encounters:  08/02/22 107/71  07/25/22 (!) 160/100  07/19/22 135/89    Wt Readings from Last 3 Encounters:  08/02/22 (!) 342 lb (155.1 kg)  07/25/22 (!) 350 lb 9.6 oz (159 kg)  07/19/22 (!) 347 lb (157.4 kg)     Physical Exam Vitals reviewed.  Constitutional:      Appearance: He is well-developed. He is obese.  HENT:     Head: Normocephalic and atraumatic.     Right Ear: External ear normal.     Left Ear: External ear normal.     Mouth/Throat:     Pharynx: No oropharyngeal exudate or posterior oropharyngeal erythema.  Eyes:     Pupils: Pupils are equal, round, and reactive to light.  Cardiovascular:     Rate and Rhythm: Normal rate and regular rhythm.     Heart sounds: No murmur heard. Pulmonary:     Effort: No respiratory distress.     Breath sounds: Normal breath  sounds.  Musculoskeletal:        General: Tenderness (R angle of scapula) present.     Cervical back: Normal range of motion and neck supple.  Neurological:     Mental Status: He is alert and oriented to person, place, and time.       Assessment & Plan:   Bradley Torres was seen today for follow-up.  Diagnoses and all orders for this visit:  Atrial fibrillation with RVR (HCC) -     CBC with Differential/Platelet -     CMP14+EGFR -     Lipid panel  Anticoagulated by anticoagulation treatment -     CBC with Differential/Platelet -     CMP14+EGFR -     Lipid panel  Stage 3 chronic kidney disease, unspecified whether stage 3a or 3b CKD (HCC) -     CBC with Differential/Platelet -     CMP14+EGFR -     Lipid  panel  Hyperlipidemia associated with type 2 diabetes mellitus (HCC) -     CBC with Differential/Platelet -     CMP14+EGFR -     Lipid panel  Hypertension associated with diabetes (HCC) -     CBC with Differential/Platelet -     CMP14+EGFR -     Lipid panel  Other orders -     ciprofloxacin (CIPRO) 500 MG tablet; Take 1 tablet (500 mg total) by mouth 2 (two) times daily. -     metroNIDAZOLE (FLAGYL) 500 MG tablet; Take 1 tablet (500 mg total) by mouth 2 (two) times daily.       I have discontinued [name] Suliman's doxycycline. I am also having him start on ciprofloxacin and metroNIDAZOLE. Additionally, I am having him maintain his gabapentin, testosterone cypionate, Vitamin D3, DHEA, Accu-Chek Softclix Lancets, dapagliflozin propanediol, Accu-Chek Guide, sitaGLIPtin, hydrALAZINE, allopurinol, rivaroxaban, metFORMIN, oxyCODONE-acetaminophen, docusate sodium, polyethylene glycol, senna-docusate, Oxycodone HCl, and atorvastatin.  Allergies as of 08/02/2022       Reactions   Actos [pioglitazone] Cough   Some dyspnea & edema ( possibly some CHF?)   Lisinopril Other (See Comments)   wheezing   Bystolic [nebivolol Hcl] Swelling   Ibuprofen Other (See Comments)   Rectal bleed   Spironolactone Hives   Trulicity [dulaglutide]    Sob, dry cough, GI   Amlodipine Itching, Other (See Comments)   Leg pain.   Diflucan [fluconazole] Rash   Hctz [hydrochlorothiazide] Other (See Comments)   Caused gout flares.   Losartan Rash        Medication List        Accurate as of Aug 02, 2022  4:07 PM. If you have any questions, ask your nurse or doctor.          STOP taking these medications    doxycycline 100 MG tablet Commonly known as: VIBRA-TABS Stopped by: Mechele Claude, MD       TAKE these medications    Accu-Chek Guide test strip Generic drug: glucose blood Use as instructed to monitor glucose twice daily   Accu-Chek Softclix Lancets lancets Use as instructed to  monitor glucose twice daily   allopurinol 100 MG tablet Commonly known as: ZYLOPRIM Take 1 tablet (100 mg total) by mouth daily.   atorvastatin 20 MG tablet Commonly known as: LIPITOR TAKE ONE TABLET BY MOUTH ONCE DAILY   ciprofloxacin 500 MG tablet Commonly known as: Cipro Take 1 tablet (500 mg total) by mouth 2 (two) times daily. Started by: Mechele Claude, MD   dapagliflozin propanediol 10 MG Tabs  tablet Commonly known as: Farxiga TAKE ONE TABLET BY MOUTH DAILY BEFORE BREAKFAST   DHEA 25 MG Caps Take by mouth daily.   docusate sodium 100 MG capsule Commonly known as: COLACE Take 1 capsule (100 mg total) by mouth 2 (two) times daily.   gabapentin 300 MG capsule Commonly known as: NEURONTIN Take 1 capsule by mouth daily as needed (pain).   hydrALAZINE 50 MG tablet Commonly known as: APRESOLINE TAKE ONE TABLET BY MOUTH THREE TIMES DAILY What changed: when to take this   metFORMIN 750 MG 24 hr tablet Commonly known as: GLUCOPHAGE-XR Take 2 tablets (1,500 mg total) by mouth daily with breakfast.   metroNIDAZOLE 500 MG tablet Commonly known as: FLAGYL Take 1 tablet (500 mg total) by mouth 2 (two) times daily. Started by: Mechele Claude, MD   Oxycodone HCl 10 MG Tabs Take 10 mg by mouth 4 (four) times daily as needed.   oxyCODONE-acetaminophen 10-325 MG tablet Commonly known as: PERCOCET Take 1 tablet by mouth 3 (three) times daily as needed for pain.   polyethylene glycol 17 g packet Commonly known as: MIRALAX / GLYCOLAX Take 17 g by mouth daily.   rivaroxaban 20 MG Tabs tablet Commonly known as: Xarelto TAKE ONE TABLET BY MOUTH DAILY WITH SUPPER What changed:  how much to take how to take this when to take this additional instructions   senna-docusate 8.6-50 MG tablet Commonly known as: Senokot-S Take 2 tablets by mouth at bedtime.   sitaGLIPtin 100 MG tablet Commonly known as: Januvia Take 1 tablet (100 mg total) by mouth daily.   testosterone  cypionate 200 MG/ML injection Commonly known as: DEPOTESTOSTERONE CYPIONATE Inject 200 mg into the muscle every 14 (fourteen) days.   Vitamin D3 250 MCG (10000 UT) capsule Take 10,000 Units by mouth daily.         Follow-up: Return in about 2 months (around 10/02/2022).  Mechele Claude, M.D.

## 2022-08-03 LAB — CBC WITH DIFFERENTIAL/PLATELET
Basophils Absolute: 0.1 10*3/uL (ref 0.0–0.2)
Basos: 1 %
EOS (ABSOLUTE): 0.4 10*3/uL (ref 0.0–0.4)
Eos: 3 %
Hematocrit: 41.6 % (ref 37.5–51.0)
Hemoglobin: 13.4 g/dL (ref 13.0–17.7)
Immature Grans (Abs): 0.1 10*3/uL (ref 0.0–0.1)
Immature Granulocytes: 1 %
Lymphocytes Absolute: 1.1 10*3/uL (ref 0.7–3.1)
Lymphs: 10 %
MCH: 30.2 pg (ref 26.6–33.0)
MCHC: 32.2 g/dL (ref 31.5–35.7)
MCV: 94 fL (ref 79–97)
Monocytes Absolute: 1.1 10*3/uL — ABNORMAL HIGH (ref 0.1–0.9)
Monocytes: 10 %
Neutrophils Absolute: 8.5 10*3/uL — ABNORMAL HIGH (ref 1.4–7.0)
Neutrophils: 75 %
Platelets: 381 10*3/uL (ref 150–450)
RBC: 4.43 x10E6/uL (ref 4.14–5.80)
RDW: 15 % (ref 11.6–15.4)
WBC: 11.2 10*3/uL — ABNORMAL HIGH (ref 3.4–10.8)

## 2022-08-03 LAB — CMP14+EGFR
ALT: 18 IU/L (ref 0–44)
AST: 19 IU/L (ref 0–40)
Albumin/Globulin Ratio: 1.2 (ref 1.2–2.2)
Albumin: 3.9 g/dL (ref 3.8–4.9)
Alkaline Phosphatase: 79 IU/L (ref 44–121)
BUN/Creatinine Ratio: 9 — ABNORMAL LOW (ref 10–24)
BUN: 14 mg/dL (ref 8–27)
Bilirubin Total: 0.8 mg/dL (ref 0.0–1.2)
CO2: 26 mmol/L (ref 20–29)
Calcium: 10.1 mg/dL (ref 8.6–10.2)
Chloride: 98 mmol/L (ref 96–106)
Creatinine, Ser: 1.52 mg/dL — ABNORMAL HIGH (ref 0.76–1.27)
Globulin, Total: 3.3 g/dL (ref 1.5–4.5)
Glucose: 130 mg/dL — ABNORMAL HIGH (ref 70–99)
Potassium: 4.5 mmol/L (ref 3.5–5.2)
Sodium: 141 mmol/L (ref 134–144)
Total Protein: 7.2 g/dL (ref 6.0–8.5)
eGFR: 52 mL/min/{1.73_m2} — ABNORMAL LOW (ref >59)

## 2022-08-03 LAB — LIPID PANEL
Chol/HDL Ratio: 3.2 ratio (ref 0.0–5.0)
Cholesterol, Total: 120 mg/dL (ref 100–199)
HDL: 38 mg/dL — ABNORMAL LOW (ref >39)
LDL Chol Calc (NIH): 61 mg/dL (ref 0–99)
Triglycerides: 112 mg/dL (ref 0–149)
VLDL Cholesterol Cal: 21 mg/dL (ref 5–40)

## 2022-08-04 DIAGNOSIS — Z6841 Body Mass Index (BMI) 40.0 and over, adult: Secondary | ICD-10-CM | POA: Diagnosis not present

## 2022-08-04 DIAGNOSIS — M539 Dorsopathy, unspecified: Secondary | ICD-10-CM | POA: Diagnosis not present

## 2022-08-04 DIAGNOSIS — R030 Elevated blood-pressure reading, without diagnosis of hypertension: Secondary | ICD-10-CM | POA: Diagnosis not present

## 2022-08-04 DIAGNOSIS — K5792 Diverticulitis of intestine, part unspecified, without perforation or abscess without bleeding: Secondary | ICD-10-CM | POA: Diagnosis not present

## 2022-08-04 DIAGNOSIS — Z79899 Other long term (current) drug therapy: Secondary | ICD-10-CM | POA: Diagnosis not present

## 2022-08-04 DIAGNOSIS — E291 Testicular hypofunction: Secondary | ICD-10-CM | POA: Diagnosis not present

## 2022-08-04 DIAGNOSIS — E1165 Type 2 diabetes mellitus with hyperglycemia: Secondary | ICD-10-CM | POA: Diagnosis not present

## 2022-08-07 NOTE — Progress Notes (Signed)
Hello Orlandus,  Your lab result is normal and/or stable.Some minor variations that are not significant are commonly marked abnormal, but do not represent any medical problem for you.  Best regards, Min Collymore, M.D.

## 2022-08-09 ENCOUNTER — Ambulatory Visit: Payer: Medicaid Other | Admitting: Family Medicine

## 2022-08-10 ENCOUNTER — Institutional Professional Consult (permissible substitution): Payer: Medicaid Other | Admitting: Pulmonary Disease

## 2022-08-16 ENCOUNTER — Encounter: Payer: Self-pay | Admitting: General Surgery

## 2022-08-16 ENCOUNTER — Ambulatory Visit: Payer: Medicaid Other | Admitting: General Surgery

## 2022-08-16 VITALS — BP 112/73 | HR 88 | Temp 97.9°F | Resp 16 | Ht 72.0 in | Wt 332.0 lb

## 2022-08-16 DIAGNOSIS — K5732 Diverticulitis of large intestine without perforation or abscess without bleeding: Secondary | ICD-10-CM | POA: Diagnosis not present

## 2022-08-17 NOTE — Progress Notes (Signed)
Subjective:     Bradley Torres  Patient here for follow-up hospitalization for perforated diverticulitis.  Since he has been discharged, he states he has second episode of left lower quadrant abdominal pain radiating around the lower part of the abdomen which was treated by Dr. Darlyn Read with ciprofloxacin and Flagyl.  He currently feels okay and has no lower abdominal pain.  His bowel movements have been regular.  He denies any fever or chills.  He did have a colonoscopy in the last 5 years. Objective:    BP 112/73   Pulse 88   Temp 97.9 F (36.6 C) (Oral)   Resp 16   Ht 6' (1.829 m)   Wt (!) 332 lb (150.6 kg)   SpO2 95%   BMI 45.03 kg/m   General:  alert, cooperative, and no distress  Abdomen is soft, nontender, nondistended.  No rigidity is noted.  Recent hospitalization reviewed     Assessment:    Sigmoid diverticulitis with perforation resulting in hospitalization.  Patient is a poor candidate for elective partial colectomy given his multiple comorbidities.  Patient does realize this.    Plan:  As stated during his hospitalization, any surgical intervention would need to be done at a tertiary care center given his multiple comorbidities.  He fully understands this.  I told him that if his condition continues to worsen or increase in frequency, he may need to be evaluated by general surgery at a tertiary care center.  He understands this and agrees.  Follow-up here as needed.

## 2022-08-18 ENCOUNTER — Encounter: Payer: Self-pay | Admitting: Pulmonary Disease

## 2022-09-02 DIAGNOSIS — Z6841 Body Mass Index (BMI) 40.0 and over, adult: Secondary | ICD-10-CM | POA: Diagnosis not present

## 2022-09-02 DIAGNOSIS — M539 Dorsopathy, unspecified: Secondary | ICD-10-CM | POA: Diagnosis not present

## 2022-09-02 DIAGNOSIS — Z79899 Other long term (current) drug therapy: Secondary | ICD-10-CM | POA: Diagnosis not present

## 2022-09-02 DIAGNOSIS — I10 Essential (primary) hypertension: Secondary | ICD-10-CM | POA: Diagnosis not present

## 2022-09-02 DIAGNOSIS — R030 Elevated blood-pressure reading, without diagnosis of hypertension: Secondary | ICD-10-CM | POA: Diagnosis not present

## 2022-09-08 ENCOUNTER — Encounter: Payer: Self-pay | Admitting: Nurse Practitioner

## 2022-09-08 ENCOUNTER — Ambulatory Visit: Payer: Medicaid Other | Attending: Nurse Practitioner | Admitting: Nurse Practitioner

## 2022-09-08 VITALS — BP 124/78 | HR 89 | Ht 72.0 in | Wt 345.2 lb

## 2022-09-08 DIAGNOSIS — Z79899 Other long term (current) drug therapy: Secondary | ICD-10-CM

## 2022-09-08 DIAGNOSIS — D6859 Other primary thrombophilia: Secondary | ICD-10-CM

## 2022-09-08 DIAGNOSIS — I2510 Atherosclerotic heart disease of native coronary artery without angina pectoris: Secondary | ICD-10-CM

## 2022-09-08 DIAGNOSIS — I480 Paroxysmal atrial fibrillation: Secondary | ICD-10-CM | POA: Diagnosis not present

## 2022-09-08 DIAGNOSIS — I10 Essential (primary) hypertension: Secondary | ICD-10-CM | POA: Diagnosis not present

## 2022-09-08 DIAGNOSIS — I272 Pulmonary hypertension, unspecified: Secondary | ICD-10-CM

## 2022-09-08 DIAGNOSIS — Z7901 Long term (current) use of anticoagulants: Secondary | ICD-10-CM

## 2022-09-08 DIAGNOSIS — Z86718 Personal history of other venous thrombosis and embolism: Secondary | ICD-10-CM | POA: Diagnosis not present

## 2022-09-08 DIAGNOSIS — Z8673 Personal history of transient ischemic attack (TIA), and cerebral infarction without residual deficits: Secondary | ICD-10-CM

## 2022-09-08 DIAGNOSIS — Z86711 Personal history of pulmonary embolism: Secondary | ICD-10-CM

## 2022-09-08 DIAGNOSIS — R0609 Other forms of dyspnea: Secondary | ICD-10-CM | POA: Diagnosis not present

## 2022-09-08 DIAGNOSIS — G4733 Obstructive sleep apnea (adult) (pediatric): Secondary | ICD-10-CM | POA: Diagnosis not present

## 2022-09-08 DIAGNOSIS — R42 Dizziness and giddiness: Secondary | ICD-10-CM

## 2022-09-08 DIAGNOSIS — R9389 Abnormal findings on diagnostic imaging of other specified body structures: Secondary | ICD-10-CM

## 2022-09-08 MED ORDER — HYDRALAZINE HCL 25 MG PO TABS: 25.0000 mg | ORAL_TABLET | Freq: Every day | ORAL | 2 refills | Status: DC

## 2022-09-08 NOTE — Patient Instructions (Addendum)
Medication Instructions:  Your physician has recommended you make the following change in your medication:  Decrease hydralazine to 25 mg daily Continue all other medications the same   Labwork: Thyroid panel in 2-3 weeks 09/26/2022 Non fasting  [facility] or family doctors office  Testing/Procedures: none  Follow-Up: Your physician recommends that you schedule a follow-up appointment in: as planned with Dr. Wyline Mood  Any Other Special Instructions Will Be Listed Below (If Applicable).  If you need a refill on your cardiac medications before your next appointment, please call your pharmacy.

## 2022-09-08 NOTE — Progress Notes (Signed)
Office Visit    Patient Name: Bradley Torres Date of Encounter: 09/08/2022  PCP:  Mechele Claude, MD   [facility]  Cardiologist:  Dina Rich, MD  Advanced Practice Provider:  No care team member to display Electrophysiologist:  None   Chief Complaint    Bradley Torres is a 62 y.o. male with a hx of CAD, history of chest pain, palpitations, hypertension, pulmonary hypertension/history of DVT, history of PE, hx of CVA, OSA, and history of PAF, who presents today for follow-up.  Past Medical History    Past Medical History:  Diagnosis Date   CAD (coronary artery disease)    a. 12/2017: cath showing 10% Proximal-LAD stenosis with no significant obstructive disease and LVEDP mildly elevated at 18 mm Hg.    Chronic back pain    Diabetes mellitus, type 2 (HCC)    DVT (deep venous thrombosis) (HCC)    Essential hypertension    Family hx of colon cancer 05/03/2017   MI (myocardial infarction) (HCC) 2019   Morbid obesity (HCC)    Panic disorder    Shoulder pain, left    Following fall   Sleep apnea    Stroke (HCC) 01/2018   Vitamin D deficiency    Past Surgical History:  Procedure Laterality Date   Ankle fracture sugery Left    BIOPSY  07/20/2020   Procedure: BIOPSY;  Surgeon: Corbin Ade, MD;  Location: [facility];  Service: Endoscopy;;  cecal polyp   COLONOSCOPY WITH PROPOFOL N/A 05/29/2017   Dr. Jena Gauss: Multiple tubular adenomas removed, prep inadequate.  Short interval surveillance colonoscopy recommended in 6 months   COLONOSCOPY WITH PROPOFOL N/A 07/20/2020   Procedure: COLONOSCOPY WITH PROPOFOL;  Surgeon: Corbin Ade, MD;  Location: [facility];  Service: Endoscopy;  Laterality: N/A;  am appt, early as possible since diabetic   LEFT HEART CATH AND CORONARY ANGIOGRAPHY N/A 01/23/2018   Procedure: LEFT HEART CATH AND CORONARY ANGIOGRAPHY;  Surgeon: Lennette Bihari, MD;  Location: [facility];  Service: Cardiovascular;   Laterality: N/A;   LUMBAR SPINE SURGERY     fusion   POLYPECTOMY  05/29/2017   Procedure: POLYPECTOMY;  Surgeon: Corbin Ade, MD;  Location: [facility];  Service: Endoscopy;;  ascending colon polyp x5-cs transverse colon polyp x4- cs descending colon polyp x1- hs sigmoid colon polyp x1 -hs rectal polyp x1- hs   TONSILLECTOMY      Allergies  Allergies  Allergen Reactions   Actos [Pioglitazone] Cough    Some dyspnea & edema ( possibly some CHF?)   Lisinopril Other (See Comments)    wheezing   Bystolic [Nebivolol Hcl] Swelling   Ibuprofen Other (See Comments)    Rectal bleed   Spironolactone Hives   Trulicity [Dulaglutide]     Sob, dry cough, GI   Amlodipine Itching and Other (See Comments)    Leg pain.   Diflucan [Fluconazole] Rash   Hctz [Hydrochlorothiazide] Other (See Comments)    Caused gout flares.   Losartan Rash    History of Present Illness    Bradley Torres is a 62 y.o. male with a PMH as mentioned above.  Last seen by Dr. Dina Rich on April 28, 2022.  Blood pressure was at goal.  Was doing well from a cardiac perspective.  Was tolerating Xarelto well.  In the interim, he was admitted March 2024 due to abdominal pain, treated for severe sepsis secondary to perforated sigmoid  diverticulitis, went into A-fib with RVR during hospitalization, received IV amiodarone and continue to have high heart rate.  Was converted over to oral amiodarone and did convert to sinus rhythm.  Echocardiogram showed normal systolic function, no significant valvular disease.  Cardiology recommended amiodarone 200 mg twice daily x 3 weeks, followed by 200 mg once daily.  Today he presents for follow-up. Has returned taking Amiodarone as he has had a few more episodes of A-fib. Chief concern is orthostatic dizziness. Denies any chest pain, worsening shortness of breath, recent palpitations, syncope, presyncope, orthopnea, PND, swelling or significant weight changes, acute bleeding,  or claudication.   EKGs/Labs/Other Studies Reviewed:   The following studies were reviewed today:   EKG:  EKG is not ordered today.    Echo 05/2022: 1. Left ventricular ejection fraction, by estimation, is 50 to 55% -  difficuly evaluation in the setting of rapid atrial fibrillation and  variable contraction from beat to beat. There is intermittent septal  bounce in association with respiration. The  left ventricle has low normal function. The left ventricle demonstrates  global hypokinesis. Left ventricular diastolic parameters are  indeterminate.   2. Right ventricular systolic function is moderately reduced. The right  ventricular size is mildly enlarged. There is mildly elevated pulmonary  artery systolic pressure. The estimated right ventricular systolic  pressure is 42.2 mmHg.   3. Left atrial size was mildly dilated.   4. The mitral valve is grossly normal. Trivial mitral valve  regurgitation.   5. The aortic valve is tricuspid. Aortic valve regurgitation is not  visualized.   6. The inferior vena cava is dilated in size with <50% respiratory  variability, suggesting right atrial pressure of 15 mmHg.   Comparison(s): Prior images reviewed side by side. Difficult to compare  since patient currently in rapid atrial fibrillation. Consider follow-up  limited study once back in sinus rhythm.  Left heart cath 12/2017: Prox LAD lesion is 10% stenosed. The left ventricular ejection fraction is 50-55% by visual estimate. LV end diastolic pressure is mildly elevated. The left ventricular systolic function is normal.   No significant coronary obstructive disease with essentially normal coronary arteries and only mild luminal irregularity of the proximal LAD of 10%; normal left circumflex and normal dominant RCA.   Low normal global LV function with an ejection fraction of 50 to 55% without definitive focal segmental wall motion abnormalities.  LVEDP is 18 mmHg.    RECOMMENDATION: Medical therapy.  Weight loss is essential.  The patient should be evaluated for obstructive sleep apnea.   No indication for antiplatelet therapy at this time.  Recent Labs: 06/21/2022: TSH 6.514 07/08/2022: Magnesium 2.1 08/02/2022: ALT 18; BUN 14; Creatinine, Ser 1.52; Hemoglobin 13.4; Platelets 381; Potassium 4.5; Sodium 141  Recent Lipid Panel    Component Value Date/Time   CHOL 120 08/02/2022 1608   TRIG 112 08/02/2022 1608   HDL 38 (L) 08/02/2022 1608   CHOLHDL 3.2 08/02/2022 1608   CHOLHDL 5.1 01/20/2018 0209   VLDL 26 01/20/2018 0209   LDLCALC 61 08/02/2022 1608    Risk Assessment/Calculations:   CHA2DS2-VASc Score = 4  This indicates a 4.8% annual risk of stroke. The patient's score is based upon: CHF History: 0 HTN History: 1 Diabetes History: 0 Stroke History: 2 Vascular Disease History: 1 Age Score: 0 Gender Score: 0   Home Medications   Current Meds  Medication Sig   Accu-Chek Softclix Lancets lancets Use as instructed to monitor glucose twice daily  allopurinol (ZYLOPRIM) 100 MG tablet Take 1 tablet (100 mg total) by mouth daily.   amiodarone (PACERONE) 200 MG tablet Take 200 mg by mouth daily.   atorvastatin (LIPITOR) 20 MG tablet TAKE ONE TABLET BY MOUTH ONCE DAILY   chlorthalidone (HYGROTON) 25 MG tablet Take 25 mg by mouth every other day.   Cholecalciferol (VITAMIN D3) 250 MCG (10000 UT) capsule Take 10,000 Units by mouth daily.   dapagliflozin propanediol (FARXIGA) 10 MG TABS tablet TAKE ONE TABLET BY MOUTH DAILY BEFORE BREAKFAST   DHEA 25 MG CAPS Take by mouth daily.   docusate sodium (COLACE) 100 MG capsule Take 1 capsule (100 mg total) by mouth 2 (two) times daily.   doxycycline (VIBRA-TABS) 100 MG tablet Take 100 mg by mouth daily.   gabapentin (NEURONTIN) 300 MG capsule Take 1 capsule by mouth daily as needed (pain).   glucose blood (ACCU-CHEK GUIDE) test strip Use as instructed to monitor glucose twice daily   metFORMIN  (GLUCOPHAGE-XR) 750 MG 24 hr tablet Take 2 tablets (1,500 mg total) by mouth daily with breakfast. (Patient taking differently: Take 750 mg by mouth daily with breakfast.)   oxyCODONE-acetaminophen (PERCOCET) 10-325 MG tablet Take 1 tablet by mouth 3 (three) times daily as needed for pain.   polyethylene glycol (MIRALAX / GLYCOLAX) 17 g packet Take 17 g by mouth daily. (Patient taking differently: Take 17 g by mouth as needed.)   rivaroxaban (XARELTO) 20 MG TABS tablet TAKE ONE TABLET BY MOUTH DAILY WITH SUPPER (Patient taking differently: Take 20 mg by mouth daily.)   senna-docusate (SENOKOT-S) 8.6-50 MG tablet Take 2 tablets by mouth at bedtime. (Patient taking differently: Take 2 tablets by mouth as needed.)   sitaGLIPtin (JANUVIA) 100 MG tablet Take 1 tablet (100 mg total) by mouth daily.   testosterone cypionate (DEPOTESTOSTERONE CYPIONATE) 200 MG/ML injection Inject 200 mg into the muscle every 14 (fourteen) days.   valsartan (DIOVAN) 160 MG tablet Take 160 mg by mouth daily.   hydrALAZINE (APRESOLINE) 50 MG tablet Take 50 mg by mouth daily.      Review of Systems    All other systems reviewed and are otherwise negative except as noted above.  Physical Exam    VS:  BP 124/78   Pulse 89   Ht 6' (1.829 m)   Wt (!) 345 lb 3.2 oz (156.6 kg)   SpO2 94%   BMI 46.82 kg/m  , BMI Body mass index is 46.82 kg/m.  Wt Readings from Last 3 Encounters:  09/08/22 (!) 345 lb 3.2 oz (156.6 kg)  08/16/22 (!) 332 lb (150.6 kg)  08/02/22 (!) 342 lb (155.1 kg)     GEN: Morbidly obese, 62 y.o. male in no acute distress. HEENT: normal. Neck: Supple, no JVD, carotid bruits, or masses. Cardiac: S1/S2, RRR, no murmurs, rubs, or gallops. No clubbing, cyanosis. Nonpitting edema to BLE. Radials2+ /PT 1+ and equal bilaterally.  Respiratory:  Respirations regular and unlabored, clear to auscultation bilaterally. MS: No deformity or atrophy. Skin: Warm and dry, no rash. Neuro:  Strength and sensation are  intact. Psych: Normal affect.  Assessment & Plan    PAF, long term anticoagulation, hypercoagulable state (history of DVT/PE) Denies any recent tachycardia or palpitations. Back on Amiodarone. HR well controlled. Continue Amiodarone and Xarelto, on appropriate dosage and denies any bleeding issues. Will obtain thyroid panel, past LFT WNL.   CAD Stable with no anginal symptoms. No indication for ischemic evaluation. Continue current medication regimen. Heart healthy diet and regular  cardiovascular exercise encouraged. ED precautions discussed.   HTN Blood pressure stable. Given BP log and discussed to monitor BP at home at least 2 hours after medications and sitting for 5-10 minutes. Continue current medication regimen. Heart healthy diet and regular cardiovascular exercise encouraged.   DOE, pulmonary hypertension, OSA  Etiology multifactorial. Pulmonary HTN is most likely d/t OSA not treated with CPAP machine. Previously referred to Pulmonology. Continue to follow with PCP.  Hx of CVA Denies any symptoms. Not on aspirin d/t being on xarelto. Continue current medication regimen. Heart healthy diet and regular cardiovascular exercise encouraged. Continue to follow with PCP.  6. Abnormal CT scan CT scan 06/22/22 revealed mild wall thickening about proximal to mid sigmoid with small perforation raising of anterior proximal sigmoid consistent with diverticulitis complicated by perforation, no evidence of abscess, multiple thickened, matted appearing loops of mid small bowel located in ventral left hemiabdomen, appearance and configuration was similar to prior examination, findings were consistent with nonspecific infectious/inflammatory enteritis. General surgery did not consider him to be a surgical candidate/did not need surgery. Follow-up with Dr. Lovell Sheehan and PCP as scheduled.   7. Orthostatic dizziness Ongoing, stable. Negative orthostatics on exam. Discussed conservative measures, if no  improvement by next OV, consider referral to Neurology. Will decrease hydralazine to 25 mg daily to improve symptoms.   8. Morbid obesity BMI 46.82. Weight loss via diet and exercise encouraged. Discussed the impact being overweight would have on cardiovascular risk.  Disposition: Follow up as scheduled with Dina Rich, MD or APP.  Signed, Sharlene Dory, NP 09/11/2022, 10:27 PM [facility]

## 2022-09-30 DIAGNOSIS — R1032 Left lower quadrant pain: Secondary | ICD-10-CM | POA: Diagnosis not present

## 2022-09-30 DIAGNOSIS — Z6841 Body Mass Index (BMI) 40.0 and over, adult: Secondary | ICD-10-CM | POA: Diagnosis not present

## 2022-10-01 ENCOUNTER — Emergency Department (HOSPITAL_COMMUNITY): Payer: Medicaid Other

## 2022-10-01 ENCOUNTER — Inpatient Hospital Stay (HOSPITAL_COMMUNITY)
Admission: EM | Admit: 2022-10-01 | Discharge: 2022-10-06 | DRG: 391 | Disposition: A | Discharge status: Home / Self Care | Payer: Medicaid Other | Attending: Internal Medicine | Admitting: Internal Medicine

## 2022-10-01 ENCOUNTER — Inpatient Hospital Stay (HOSPITAL_COMMUNITY): Payer: Medicaid Other

## 2022-10-01 ENCOUNTER — Encounter (HOSPITAL_COMMUNITY): Payer: Self-pay | Admitting: Emergency Medicine

## 2022-10-01 ENCOUNTER — Other Ambulatory Visit: Payer: Self-pay

## 2022-10-01 DIAGNOSIS — E869 Volume depletion, unspecified: Secondary | ICD-10-CM | POA: Diagnosis present

## 2022-10-01 DIAGNOSIS — K5720 Diverticulitis of large intestine with perforation and abscess without bleeding: Principal | ICD-10-CM | POA: Diagnosis present

## 2022-10-01 DIAGNOSIS — Z86010 Personal history of colonic polyps: Secondary | ICD-10-CM

## 2022-10-01 DIAGNOSIS — E1122 Type 2 diabetes mellitus with diabetic chronic kidney disease: Secondary | ICD-10-CM | POA: Diagnosis present

## 2022-10-01 DIAGNOSIS — Z7901 Long term (current) use of anticoagulants: Secondary | ICD-10-CM | POA: Diagnosis not present

## 2022-10-01 DIAGNOSIS — K8020 Calculus of gallbladder without cholecystitis without obstruction: Secondary | ICD-10-CM | POA: Diagnosis present

## 2022-10-01 DIAGNOSIS — I700 Atherosclerosis of aorta: Secondary | ICD-10-CM | POA: Diagnosis present

## 2022-10-01 DIAGNOSIS — I480 Paroxysmal atrial fibrillation: Secondary | ICD-10-CM | POA: Diagnosis present

## 2022-10-01 DIAGNOSIS — N1832 Chronic kidney disease, stage 3b: Secondary | ICD-10-CM | POA: Diagnosis present

## 2022-10-01 DIAGNOSIS — I272 Pulmonary hypertension, unspecified: Secondary | ICD-10-CM | POA: Diagnosis present

## 2022-10-01 DIAGNOSIS — Z5986 Financial insecurity: Secondary | ICD-10-CM

## 2022-10-01 DIAGNOSIS — G8929 Other chronic pain: Secondary | ICD-10-CM | POA: Diagnosis not present

## 2022-10-01 DIAGNOSIS — E871 Hypo-osmolality and hyponatremia: Secondary | ICD-10-CM | POA: Diagnosis present

## 2022-10-01 DIAGNOSIS — I130 Hypertensive heart and chronic kidney disease with heart failure and stage 1 through stage 4 chronic kidney disease, or unspecified chronic kidney disease: Secondary | ICD-10-CM | POA: Diagnosis present

## 2022-10-01 DIAGNOSIS — Z800 Family history of malignant neoplasm of digestive organs: Secondary | ICD-10-CM

## 2022-10-01 DIAGNOSIS — I2510 Atherosclerotic heart disease of native coronary artery without angina pectoris: Secondary | ICD-10-CM | POA: Diagnosis present

## 2022-10-01 DIAGNOSIS — K5780 Diverticulitis of intestine, part unspecified, with perforation and abscess without bleeding: Secondary | ICD-10-CM | POA: Diagnosis not present

## 2022-10-01 DIAGNOSIS — E1165 Type 2 diabetes mellitus with hyperglycemia: Secondary | ICD-10-CM | POA: Diagnosis not present

## 2022-10-01 DIAGNOSIS — Z86711 Personal history of pulmonary embolism: Secondary | ICD-10-CM | POA: Diagnosis present

## 2022-10-01 DIAGNOSIS — R1032 Left lower quadrant pain: Secondary | ICD-10-CM | POA: Diagnosis not present

## 2022-10-01 DIAGNOSIS — N179 Acute kidney failure, unspecified: Secondary | ICD-10-CM | POA: Diagnosis present

## 2022-10-01 DIAGNOSIS — E785 Hyperlipidemia, unspecified: Secondary | ICD-10-CM | POA: Diagnosis present

## 2022-10-01 DIAGNOSIS — Z86718 Personal history of other venous thrombosis and embolism: Secondary | ICD-10-CM

## 2022-10-01 DIAGNOSIS — Z8673 Personal history of transient ischemic attack (TIA), and cerebral infarction without residual deficits: Secondary | ICD-10-CM

## 2022-10-01 DIAGNOSIS — E876 Hypokalemia: Secondary | ICD-10-CM | POA: Diagnosis present

## 2022-10-01 DIAGNOSIS — I5032 Chronic diastolic (congestive) heart failure: Secondary | ICD-10-CM | POA: Diagnosis present

## 2022-10-01 DIAGNOSIS — Z7984 Long term (current) use of oral hypoglycemic drugs: Secondary | ICD-10-CM

## 2022-10-01 DIAGNOSIS — Z883 Allergy status to other anti-infective agents status: Secondary | ICD-10-CM

## 2022-10-01 DIAGNOSIS — G4733 Obstructive sleep apnea (adult) (pediatric): Secondary | ICD-10-CM | POA: Diagnosis present

## 2022-10-01 DIAGNOSIS — E872 Acidosis, unspecified: Secondary | ICD-10-CM | POA: Diagnosis not present

## 2022-10-01 DIAGNOSIS — K651 Peritoneal abscess: Secondary | ICD-10-CM | POA: Diagnosis present

## 2022-10-01 DIAGNOSIS — Z6841 Body Mass Index (BMI) 40.0 and over, adult: Secondary | ICD-10-CM | POA: Diagnosis not present

## 2022-10-01 DIAGNOSIS — Z79899 Other long term (current) drug therapy: Secondary | ICD-10-CM

## 2022-10-01 DIAGNOSIS — M1A9XX0 Chronic gout, unspecified, without tophus (tophi): Secondary | ICD-10-CM | POA: Diagnosis present

## 2022-10-01 DIAGNOSIS — Z8249 Family history of ischemic heart disease and other diseases of the circulatory system: Secondary | ICD-10-CM

## 2022-10-01 DIAGNOSIS — Z888 Allergy status to other drugs, medicaments and biological substances status: Secondary | ICD-10-CM

## 2022-10-01 DIAGNOSIS — Z981 Arthrodesis status: Secondary | ICD-10-CM

## 2022-10-01 DIAGNOSIS — E782 Mixed hyperlipidemia: Secondary | ICD-10-CM | POA: Diagnosis present

## 2022-10-01 DIAGNOSIS — R188 Other ascites: Secondary | ICD-10-CM | POA: Diagnosis not present

## 2022-10-01 DIAGNOSIS — I252 Old myocardial infarction: Secondary | ICD-10-CM

## 2022-10-01 DIAGNOSIS — E1159 Type 2 diabetes mellitus with other circulatory complications: Secondary | ICD-10-CM | POA: Diagnosis present

## 2022-10-01 LAB — COMPREHENSIVE METABOLIC PANEL
ALT: 10 U/L (ref 0–44)
AST: 13 U/L — ABNORMAL LOW (ref 15–41)
Albumin: 2.9 g/dL — ABNORMAL LOW (ref 3.5–5.0)
Alkaline Phosphatase: 56 U/L (ref 38–126)
Anion gap: 11 (ref 5–15)
BUN: 21 mg/dL — ABNORMAL HIGH (ref 6–20)
CO2: 26 mmol/L (ref 22–32)
Calcium: 8.4 mg/dL — ABNORMAL LOW (ref 8.9–10.3)
Chloride: 96 mmol/L — ABNORMAL LOW (ref 98–111)
Creatinine, Ser: 1.89 mg/dL — ABNORMAL HIGH (ref 0.61–1.24)
GFR, Estimated: 40 mL/min — ABNORMAL LOW (ref >60)
Glucose, Bld: 199 mg/dL — ABNORMAL HIGH (ref 70–99)
Potassium: 3.3 mmol/L — ABNORMAL LOW (ref 3.5–5.1)
Sodium: 133 mmol/L — ABNORMAL LOW (ref 135–145)
Total Bilirubin: 1 mg/dL (ref 0.3–1.2)
Total Protein: 7.1 g/dL (ref 6.5–8.1)

## 2022-10-01 LAB — URINALYSIS, ROUTINE W REFLEX MICROSCOPIC
Bacteria, UA: NONE SEEN
Bilirubin Urine: NEGATIVE
Glucose, UA: >=500 mg/dL — AB
Hgb urine dipstick: NEGATIVE
Ketones, ur: NEGATIVE mg/dL
Leukocytes,Ua: NEGATIVE
Nitrite: NEGATIVE
Protein, ur: 30 mg/dL — AB
Specific Gravity, Urine: 1.04 — ABNORMAL HIGH (ref 1.005–1.030)
pH: 5 (ref 5.0–8.0)

## 2022-10-01 LAB — CBC
HCT: 39.4 % (ref 39.0–52.0)
Hemoglobin: 12.5 g/dL — ABNORMAL LOW (ref 13.0–17.0)
MCH: 30.7 pg (ref 26.0–34.0)
MCHC: 31.7 g/dL (ref 30.0–36.0)
MCV: 96.8 fL (ref 80.0–100.0)
Platelets: 338 10*3/uL (ref 150–400)
RBC: 4.07 MIL/uL — ABNORMAL LOW (ref 4.22–5.81)
RDW: 14.8 % (ref 11.5–15.5)
WBC: 18 10*3/uL — ABNORMAL HIGH (ref 4.0–10.5)
nRBC: 0 % (ref 0.0–0.2)

## 2022-10-01 LAB — GLUCOSE, CAPILLARY
Glucose-Capillary: 138 mg/dL — ABNORMAL HIGH (ref 70–99)
Glucose-Capillary: 169 mg/dL — ABNORMAL HIGH (ref 70–99)

## 2022-10-01 LAB — CULTURE, BLOOD (ROUTINE X 2): Special Requests: ADEQUATE

## 2022-10-01 LAB — LIPASE, BLOOD: Lipase: 24 U/L (ref 11–51)

## 2022-10-01 LAB — LACTIC ACID, PLASMA
Lactic Acid, Venous: 2.2 mmol/L (ref 0.5–1.9)
Lactic Acid, Venous: 1.7 mmol/L (ref 0.5–1.9)

## 2022-10-01 MED ORDER — MIDAZOLAM HCL 2 MG/2ML IJ SOLN
INTRAMUSCULAR | Status: AC | PRN
  Administered 2022-10-01: 1 mg via INTRAVENOUS
  Administered 2022-10-01: 2 mg via INTRAVENOUS

## 2022-10-01 MED ORDER — FENTANYL CITRATE PF 50 MCG/ML IJ SOSY: 50.0000 ug | PREFILLED_SYRINGE | INTRAMUSCULAR | Status: DC | PRN

## 2022-10-01 MED ORDER — FENTANYL CITRATE PF 50 MCG/ML IJ SOSY
50.0000 ug | PREFILLED_SYRINGE | Freq: Once | INTRAMUSCULAR | Status: AC
  Administered 2022-10-01: 50 ug via INTRAVENOUS
  Filled 2022-10-01: qty 1

## 2022-10-01 MED ORDER — SODIUM CHLORIDE 0.9 % IV BOLUS
1000.0000 mL | Freq: Once | INTRAVENOUS | Status: AC
  Administered 2022-10-01: 1000 mL via INTRAVENOUS

## 2022-10-01 MED ORDER — HYDRALAZINE HCL 20 MG/ML IJ SOLN: 10.0000 mg | INTRAMUSCULAR | Status: DC | PRN

## 2022-10-01 MED ORDER — SODIUM CHLORIDE 0.9% FLUSH
5.0000 mL | Freq: Three times a day (TID) | INTRAVENOUS | Status: DC
  Administered 2022-10-01 – 2022-10-06 (×15): 5 mL

## 2022-10-01 MED ORDER — ACETAMINOPHEN 650 MG RE SUPP: 650.0000 mg | Freq: Four times a day (QID) | RECTAL | Status: DC | PRN

## 2022-10-01 MED ORDER — PIPERACILLIN-TAZOBACTAM 3.375 G IVPB
3.3750 g | Freq: Three times a day (TID) | INTRAVENOUS | Status: DC
  Administered 2022-10-01 – 2022-10-06 (×14): 3.375 g via INTRAVENOUS
  Filled 2022-10-01 (×14): qty 50

## 2022-10-01 MED ORDER — PIPERACILLIN-TAZOBACTAM 3.375 G IVPB 30 MIN
3.3750 g | Freq: Once | INTRAVENOUS | Status: AC
  Administered 2022-10-01: 3.375 g via INTRAVENOUS
  Filled 2022-10-01: qty 50

## 2022-10-01 MED ORDER — AMIODARONE HCL 200 MG PO TABS
200.0000 mg | ORAL_TABLET | Freq: Every day | ORAL | Status: DC
  Administered 2022-10-01 – 2022-10-06 (×6): 200 mg via ORAL
  Filled 2022-10-01 (×6): qty 1

## 2022-10-01 MED ORDER — LACTATED RINGERS IV SOLN: INTRAVENOUS | Status: DC

## 2022-10-01 MED ORDER — INSULIN ASPART 100 UNIT/ML IJ SOLN
0.0000 [IU] | INTRAMUSCULAR | Status: DC
  Administered 2022-10-01: 3 [IU] via SUBCUTANEOUS
  Administered 2022-10-01 – 2022-10-02 (×3): 2 [IU] via SUBCUTANEOUS
  Administered 2022-10-02: 5 [IU] via SUBCUTANEOUS
  Administered 2022-10-02 – 2022-10-03 (×4): 2 [IU] via SUBCUTANEOUS
  Administered 2022-10-03: 3 [IU] via SUBCUTANEOUS
  Administered 2022-10-03 – 2022-10-04 (×3): 2 [IU] via SUBCUTANEOUS
  Administered 2022-10-04 – 2022-10-05 (×4): 3 [IU] via SUBCUTANEOUS
  Administered 2022-10-05: 2 [IU] via SUBCUTANEOUS
  Administered 2022-10-05: 5 [IU] via SUBCUTANEOUS
  Administered 2022-10-05 – 2022-10-06 (×2): 3 [IU] via SUBCUTANEOUS

## 2022-10-01 MED ORDER — OXYCODONE HCL 5 MG PO TABS
5.0000 mg | ORAL_TABLET | Freq: Four times a day (QID) | ORAL | Status: DC | PRN
  Administered 2022-10-01 – 2022-10-06 (×15): 5 mg via ORAL
  Filled 2022-10-01 (×15): qty 1

## 2022-10-01 MED ORDER — HYDROMORPHONE HCL 1 MG/ML IJ SOLN
1.0000 mg | INTRAMUSCULAR | Status: DC | PRN
  Administered 2022-10-01 – 2022-10-05 (×16): 1 mg via INTRAVENOUS
  Filled 2022-10-01 (×19): qty 1

## 2022-10-01 MED ORDER — ACETAMINOPHEN 325 MG PO TABS: 650.0000 mg | ORAL_TABLET | Freq: Four times a day (QID) | ORAL | Status: DC | PRN

## 2022-10-01 MED ORDER — IOHEXOL 300 MG/ML  SOLN
80.0000 mL | Freq: Once | INTRAMUSCULAR | Status: AC | PRN
  Administered 2022-10-01: 80 mL via INTRAVENOUS

## 2022-10-01 MED ORDER — OXYCODONE-ACETAMINOPHEN 10-325 MG PO TABS: 1.0000 | ORAL_TABLET | Freq: Four times a day (QID) | ORAL | Status: DC | PRN

## 2022-10-01 MED ORDER — OXYCODONE-ACETAMINOPHEN 5-325 MG PO TABS
1.0000 | ORAL_TABLET | Freq: Four times a day (QID) | ORAL | Status: DC | PRN
  Administered 2022-10-01 – 2022-10-06 (×15): 1 via ORAL
  Filled 2022-10-01 (×15): qty 1

## 2022-10-01 MED ORDER — FENTANYL CITRATE (PF) 100 MCG/2ML IJ SOLN
INTRAMUSCULAR | Status: AC | PRN
  Administered 2022-10-01: 50 ug via INTRAVENOUS

## 2022-10-01 NOTE — Consult Note (Signed)
Chief Complaint: Patient was seen in consultation today for CT guided drainage of abdominal/peritoneal  abscess  Chief Complaint  Patient presents with   Abdominal Pain    Referring Physician(s): Grunz,R  Supervising Physician: Malachy Moan  Patient Status: [facility] IP  History of Present Illness: Bradley Torres is a 62 y.o. male with PMH sig for CAD/MI, DM, RLE DVT 2019, HTN, obesity, panic disorder, sleep apnea, diverticulosis, renal insuff, stroke, vit D def who presents now with several day hx of recurrent LLQ abd pain . He has a prior hx perforated diverticulitis in March 2024 but no evidence of abscess at that time. CT A/P done today revealed:   1. Large peritoneal abscess in the LEFT ventral peritoneal space. Potential communication with the descending colon. Findings most consistent with perforated diverticulitis of the descending colon with abscess formation. No intraperitoneal free air. No pneumatosis or portal venous gas. 2. Cholelithiasis without evidence of cholecystitis. 3. Aortic atherosclerosis  He is afebrile, WBC 18, hgb 12.5, plts nl, creat 1.89, blood cx pend; request now received for CT guided drainage of left abd abscess.   Past Medical History:  Diagnosis Date   CAD (coronary artery disease)    a. 12/2017: cath showing 10% Proximal-LAD stenosis with no significant obstructive disease and LVEDP mildly elevated at 18 mm Hg.    Chronic back pain    Diabetes mellitus, type 2 (HCC)    DVT (deep venous thrombosis) (HCC)    Essential hypertension    Family hx of colon cancer 05/03/2017   MI (myocardial infarction) (HCC) 2019   Morbid obesity (HCC)    Panic disorder    Shoulder pain, left    Following fall   Sleep apnea    Stroke (HCC) 01/2018   Vitamin D deficiency     Past Surgical History:  Procedure Laterality Date   Ankle fracture sugery Left    BIOPSY  07/20/2020   Procedure: BIOPSY;  Surgeon: Corbin Ade, MD;  [facility];   Service: Endoscopy;;  cecal polyp   COLONOSCOPY WITH PROPOFOL N/A 05/29/2017   Dr. Jena Gauss: Multiple tubular adenomas removed, prep inadequate.  Short interval surveillance colonoscopy recommended in 6 months   COLONOSCOPY WITH PROPOFOL N/A 07/20/2020   Procedure: COLONOSCOPY WITH PROPOFOL;  Surgeon: Corbin Ade, MD;  Location: [facility];  Service: Endoscopy;  Laterality: N/A;  am appt, early as possible since diabetic   LEFT HEART CATH AND CORONARY ANGIOGRAPHY N/A 01/23/2018   Procedure: LEFT HEART CATH AND CORONARY ANGIOGRAPHY;  Surgeon: Lennette Bihari, MD;  Location: [facility];  Service: Cardiovascular;  Laterality: N/A;   LUMBAR SPINE SURGERY     fusion   POLYPECTOMY  05/29/2017   Procedure: POLYPECTOMY;  Surgeon: Corbin Ade, MD;  Location: [facility];  Service: Endoscopy;;  ascending colon polyp x5-cs transverse colon polyp x4- cs descending colon polyp x1- hs sigmoid colon polyp x1 -hs rectal polyp x1- hs   TONSILLECTOMY      Allergies: Actos [pioglitazone], Lisinopril, Bystolic [nebivolol hcl], Ibuprofen, Spironolactone, Trulicity [dulaglutide], Amlodipine, Diflucan [fluconazole], Hctz [hydrochlorothiazide], and Losartan  Medications: Prior to Admission medications   Medication Sig Start Date End Date Taking? Authorizing Provider  Accu-Chek Softclix Lancets lancets Use as instructed to monitor glucose twice daily 09/23/21   Dani Gobble, NP  allopurinol (ZYLOPRIM) 100 MG tablet Take 1 tablet (100 mg total) by mouth daily. 02/14/22   Mechele Claude, MD  amiodarone (PACERONE) 200 MG tablet  Take 200 mg by mouth daily.    [provider]  atorvastatin (LIPITOR) 20 MG tablet TAKE ONE TABLET BY MOUTH ONCE DAILY 08/01/22   Antoine Poche, MD  chlorthalidone (HYGROTON) 25 MG tablet Take 25 mg by mouth every other day.    [provider]  Cholecalciferol (VITAMIN D3) 250 MCG (10000 UT) capsule Take 10,000 Units by mouth daily.    [provider]  dapagliflozin propanediol (FARXIGA) 10 MG TABS tablet TAKE ONE TABLET BY MOUTH DAILY BEFORE BREAKFAST 10/28/21   Dani Gobble, NP  DHEA 25 MG CAPS Take by mouth daily.    [provider]  docusate sodium (COLACE) 100 MG capsule Take 1 capsule (100 mg total) by mouth 2 (two) times daily. 06/28/22   Osvaldo Shipper, MD  doxycycline (VIBRA-TABS) 100 MG tablet Take 100 mg by mouth daily.    [provider]  gabapentin (NEURONTIN) 300 MG capsule Take 1 capsule by mouth daily as needed (pain). 04/22/20   [provider]  glucose blood (ACCU-CHEK GUIDE) test strip Use as instructed to monitor glucose twice daily 10/28/21   Dani Gobble, NP  hydrALAZINE (APRESOLINE) 25 MG tablet Take 1 tablet (25 mg total) by mouth daily. 09/08/22   Sharlene Dory, NP  metFORMIN (GLUCOPHAGE-XR) 750 MG 24 hr tablet Take 2 tablets (1,500 mg total) by mouth daily with breakfast. Patient taking differently: Take 750 mg by mouth daily with breakfast. 05/11/22   Mechele Claude, MD  oxyCODONE-acetaminophen (PERCOCET) 10-325 MG tablet Take 1 tablet by mouth 3 (three) times daily as needed for pain. 06/28/22   Osvaldo Shipper, MD  polyethylene glycol (MIRALAX / GLYCOLAX) 17 g packet Take 17 g by mouth daily. Patient taking differently: Take 17 g by mouth as needed. 06/28/22   Osvaldo Shipper, MD  rivaroxaban (XARELTO) 20 MG TABS tablet TAKE ONE TABLET BY MOUTH DAILY WITH SUPPER Patient taking differently: Take 20 mg by mouth daily. 02/14/22   Mechele Claude, MD  senna-docusate (SENOKOT-S) 8.6-50 MG tablet Take 2 tablets by mouth at bedtime. Patient taking differently: Take 2 tablets by mouth as needed. 06/28/22   Osvaldo Shipper, MD  sitaGLIPtin (JANUVIA) 100 MG tablet Take 1 tablet (100 mg total) by mouth daily. 10/28/21   Dani Gobble, NP  testosterone cypionate (DEPOTESTOSTERONE CYPIONATE) 200 MG/ML injection Inject 200 mg into the muscle every 14 (fourteen) days. 07/30/21   [provider]  valsartan (DIOVAN) 160 MG tablet Take 160 mg by mouth daily.    [provider]     Family History  Problem Relation Age of Onset   Colon cancer Mother        died at age 49   Heart attack Father    Hepatitis Sister 69       Autoimmune   Heart attack Paternal Grandfather     Social History   Socioeconomic History   Marital status: Single    Spouse name: Not on file   Number of children: Not on file   Years of education: Not on file   Highest education level: Not on file  Occupational History   Occupation: disabled  Tobacco Use   Smoking status: Never    Passive exposure: Never   Smokeless tobacco: Never  Vaping Use   Vaping Use: Never used  Substance and Sexual Activity   Alcohol use: Yes    Comment: occ   Drug use: Not Currently    Types: Marijuana    Comment: daily;  05/19/20 no marijuana in past 6 months d/t going to pain management clinic   Sexual activity: Not Currently    Birth control/protection: None  Other Topics Concern   Not on file  Social History Narrative   Not on file   Social Determinants of Health   Financial Resource Strain: Medium Risk (05/18/2020)   Overall Financial Resource Strain (CARDIA)    Difficulty of Paying Living Expenses: Somewhat hard  Food Insecurity: No Food Insecurity (06/22/2022)   Hunger Vital Sign    Worried About Running Out of Food in the Last Year: Never true    Ran Out of Food in the Last Year: Never true  Transportation Needs: No Transportation Needs (06/22/2022)   PRAPARE - Administrator, Civil Service (Medical): No    Lack of Transportation (Non-Medical): No  Physical Activity: Not on file  Stress: Stress Concern Present (05/15/2019)   [employer] of Occupational Health - Occupational Stress Questionnaire    Feeling of Stress : Rather much  Social Connections: Unknown (01/16/2020)   Social Connection and Isolation Panel [NHANES]    Frequency of Communication with Friends and  Family: More than three times a week    Frequency of Social Gatherings with Friends and Family: More than three times a week    Attends Religious Services: Not on Marketing executive or Organizations: Not on file    Attends Banker Meetings: Not on file    Marital Status: Not on file      Review of Systems denies high fever, HA,CP,dyspnea, N/V or bleeding ; he does have occ cough, LLQ/back pain  Vital Signs: BP (!) 128/98 (BP Location: Right Arm)   Pulse 88   Temp 98.9 F (37.2 C) (Oral)   Resp 18   Ht 6' (1.829 m)   Wt (!) 333 lb (151 kg)   SpO2 97%   BMI 45.16 kg/m     Physical Exam: awake/alert; chest- CTA bilat ant; heart- RRR; abd- obese, soft, mod tender LLQ, ext with FROM  Imaging: CT ABDOMEN PELVIS W CONTRAST  Result Date: 10/01/2022 CLINICAL DATA:  Abdominal pain for 2 days. LEFT lower quadrant pain. History of perforated diverticulitis in March 2024. EXAM: CT ABDOMEN AND PELVIS WITH CONTRAST TECHNIQUE: Multidetector CT imaging of the abdomen and pelvis was performed using the standard protocol following bolus administration of intravenous contrast. RADIATION DOSE REDUCTION: This exam was performed according to the departmental dose-optimization program which includes automated exposure control, adjustment of the mA and/or kV according to patient size and/or use of iterative reconstruction technique. CONTRAST:  80mL OMNIPAQUE IOHEXOL 300 MG/ML  SOLN COMPARISON:  CT 06/22/2022 FINDINGS: Lower chest: Lung bases are clear. Hepatobiliary: No focal hepatic lesion. Several small gallstones. No gallbladder inflammation. No biliary duct dilatation. Common bile duct is normal. Pancreas: Pancreas is normal. No ductal dilatation. No pancreatic inflammation. Spleen: Normal spleen Adrenals/urinary tract: Adrenal glands and kidneys are normal. The ureters and bladder normal. Stomach/Bowel: Stomach, duodenum are normal. There is a contained gas in fluid collection in  the LEFT ventral peritoneal space measuring 9.3 x 5.8 by 10.1 cm (image 61/series 2) most consistent with a peritoneal abscess. This fluid collection is immediately adjacent to the descending colon along the mesenteric border. Potential communication with the colon on image 58/2. The abscess is also immediately adjacent loops of small bowel in the LEFT upper quadrant and pushes these loops of small bowel medially. There is no intraperitoneal free air.  No pneumatosis or portal venous gas. The distal small bowel is normal. The terminal ileum is normal. Appendix is normal. Ascending and transverse colon normal. There is inflammation along the proximal descending colon as described above. Diverticulosis of the sigmoid colon. Rectum normal. Vascular/Lymphatic: Abdominal aorta is normal caliber with atherosclerotic calcification. There is no retroperitoneal or periportal lymphadenopathy. No pelvic lymphadenopathy. Reproductive: Prostate unremarkable Other: No free fluid. Musculoskeletal: No aggressive osseous lesion. IMPRESSION: 1. Large peritoneal abscess in the LEFT ventral peritoneal space. Potential communication with the descending colon. Findings most consistent with perforated diverticulitis of the descending colon with abscess formation. No intraperitoneal free air. No pneumatosis or portal venous gas. 2. Cholelithiasis without evidence of cholecystitis. 3. Aortic atherosclerosis. Aortic Atherosclerosis (ICD10-I70.0). Electronically Signed   By: Genevive Bi M.D.   On: 10/01/2022 09:54    Labs:  CBC: Recent Labs    07/08/22 1123 07/14/22 1626 08/02/22 1608 10/01/22 0829  WBC 12.1* 16.7* 11.2* 18.0*  HGB 12.5* 13.0 13.4 12.5*  HCT 38.7 39.1 41.6 39.4  PLT 533* 450 381 338    COAGS: No results for input(s): "INR", "APTT" in the last 8760 hours.  BMP: Recent Labs    06/25/22 0455 06/27/22 0421 06/28/22 0429 07/08/22 1123 07/14/22 1626 08/02/22 1608 10/01/22 0829  NA 133* 135 138 138  136 141 133*  K 3.7 4.1 4.5 4.6 4.5 4.5 3.3*  CL 98 96* 95* 99 95* 98 96*  CO2 27 28 33* 28 21 26 26   GLUCOSE 145* 131* 131* 129* 130* 130* 199*  BUN 19 16 18 16 14 14  21*  CALCIUM 8.0* 9.0 9.2 8.9 9.1 10.1 8.4*  CREATININE 1.57* 1.51* 1.58* 1.75* 1.55* 1.52* 1.89*  GFRNONAA 50* 53* 50*  --   --   --  40*    LIVER FUNCTION TESTS: Recent Labs    06/20/22 0409 07/08/22 1123 08/02/22 1608 10/01/22 0829  BILITOT 1.2 0.5 0.8 1.0  AST 13* 32 19 13*  ALT 15 29 18 10   ALKPHOS 50 70 79 56  PROT 6.3* 6.7 7.2 7.1  ALBUMIN 2.9* 3.2* 3.9 2.9*    TUMOR MARKERS: No results for input(s): "AFPTM", "CEA", "CA199", "CHROMGRNA" in the last 8760 hours.  Assessment and Plan: 61 y.o. male with PMH sig for CAD/MI, DM, RLE DVT 2019, PE, pulm HTN, PAF on xarelto, HTN, obesity, panic disorder, sleep apnea, diverticulosis, renal insuff, stroke, vit D def who presents now with several day hx of recurrent LLQ abd pain . He has a prior hx perforated diverticulitis in March 2024 but no evidence of abscess at that time. CT A/P done today revealed:   1. Large peritoneal abscess in the LEFT ventral peritoneal space. Potential communication with the descending colon. Findings most consistent with perforated diverticulitis of the descending colon with abscess formation. No intraperitoneal free air. No pneumatosis or portal venous gas. 2. Cholelithiasis without evidence of cholecystitis. 3. Aortic atherosclerosis  He is afebrile, WBC 18, hgb 12.5, plts nl, creat 1.89, blood cx pend; request now received for CT guided drainage of left abd abscess. Imaging studies have been reviewed by Dr. Archer Asa.Risks and benefits discussed with the patient including bleeding, infection, damage to adjacent structures, bowel perforation/fistula connection, and sepsis.  All of the patient's questions were answered, patient is agreeable to proceed. Consent signed and in chart. Procedure scheduled for today.     Thank you  for this interesting consult.  I greatly enjoyed meeting QUENTEL WHARY and look forward to participating in  their care.  A copy of this report was sent to the requesting provider on this date.  Electronically Signed: D. Jeananne Rama, PA-C 10/01/2022, 1:46 PM   I spent a total of 20 minutes    in face to face in clinical consultation, greater than 50% of which was counseling/coordinating care for CT guided drainage of left abdominal abscess

## 2022-10-01 NOTE — Plan of Care (Signed)
Problem: Education: Goal: Knowledge of General Education information will improve Description: Including pain rating scale, medication(s)/side effects and non-pharmacologic comfort measures 10/01/2022 1818 by Joanne Chars, RN Outcome: Progressing 10/01/2022 1636 by Joanne Chars, RN Outcome: Progressing   Problem: Health Behavior/Discharge Planning: Goal: Ability to manage health-related needs will improve 10/01/2022 1818 by Joanne Chars, RN Outcome: Progressing 10/01/2022 1636 by Joanne Chars, RN Outcome: Progressing   Problem: Clinical Measurements: Goal: Ability to maintain clinical measurements within normal limits will improve 10/01/2022 1818 by Joanne Chars, RN Outcome: Progressing 10/01/2022 1636 by Joanne Chars, RN Outcome: Progressing Goal: Will remain free from infection 10/01/2022 1818 by Joanne Chars, RN Outcome: Progressing 10/01/2022 1636 by Joanne Chars, RN Outcome: Progressing Goal: Diagnostic test results will improve 10/01/2022 1818 by Joanne Chars, RN Outcome: Progressing 10/01/2022 1636 by Joanne Chars, RN Outcome: Progressing Goal: Respiratory complications will improve 10/01/2022 1818 by Joanne Chars, RN Outcome: Progressing 10/01/2022 1636 by Joanne Chars, RN Outcome: Progressing Goal: Cardiovascular complication will be avoided 10/01/2022 1818 by Joanne Chars, RN Outcome: Progressing 10/01/2022 1636 by Joanne Chars, RN Outcome: Progressing   Problem: Activity: Goal: Risk for activity intolerance will decrease 10/01/2022 1818 by Joanne Chars, RN Outcome: Progressing 10/01/2022 1636 by Joanne Chars, RN Outcome: Progressing   Problem: Nutrition: Goal: Adequate nutrition will be maintained 10/01/2022 1818 by Joanne Chars, RN Outcome: Progressing 10/01/2022 1636 by Joanne Chars, RN Outcome: Progressing   Problem: Coping: Goal: Level of anxiety will decrease 10/01/2022 1818 by Joanne Chars, RN Outcome: Progressing 10/01/2022 1636 by Joanne Chars,  RN Outcome: Progressing   Problem: Elimination: Goal: Will not experience complications related to bowel motility 10/01/2022 1818 by Joanne Chars, RN Outcome: Progressing 10/01/2022 1636 by Joanne Chars, RN Outcome: Progressing Goal: Will not experience complications related to urinary retention 10/01/2022 1818 by Joanne Chars, RN Outcome: Progressing 10/01/2022 1636 by Joanne Chars, RN Outcome: Progressing   Problem: Pain Managment: Goal: General experience of comfort will improve 10/01/2022 1818 by Joanne Chars, RN Outcome: Progressing 10/01/2022 1636 by Joanne Chars, RN Outcome: Progressing   Problem: Safety: Goal: Ability to remain free from injury will improve 10/01/2022 1818 by Joanne Chars, RN Outcome: Progressing 10/01/2022 1636 by Joanne Chars, RN Outcome: Progressing   Problem: Skin Integrity: Goal: Risk for impaired skin integrity will decrease 10/01/2022 1818 by Joanne Chars, RN Outcome: Progressing 10/01/2022 1636 by Joanne Chars, RN Outcome: Progressing   Problem: Education: Goal: Ability to describe self-care measures that may prevent or decrease complications (Diabetes Survival Skills Education) will improve 10/01/2022 1818 by Joanne Chars, RN Outcome: Progressing 10/01/2022 1636 by Joanne Chars, RN Outcome: Progressing Goal: Individualized Educational Video(s) 10/01/2022 1818 by Joanne Chars, RN Outcome: Progressing 10/01/2022 1636 by Joanne Chars, RN Outcome: Progressing   Problem: Coping: Goal: Ability to adjust to condition or change in health will improve 10/01/2022 1818 by Joanne Chars, RN Outcome: Progressing 10/01/2022 1636 by Joanne Chars, RN Outcome: Progressing   Problem: Fluid Volume: Goal: Ability to maintain a balanced intake and output will improve 10/01/2022 1818 by Joanne Chars, RN Outcome: Progressing 10/01/2022 1636 by Joanne Chars, RN Outcome: Progressing   Problem: Health Behavior/Discharge Planning: Goal: Ability to identify and  utilize available resources and services will improve 10/01/2022 1818 by Joanne Chars, RN Outcome: Progressing 10/01/2022 1636 by Joanne Chars, RN Outcome: Progressing Goal: Ability to manage health-related needs will improve 10/01/2022 1818 by Joanne Chars, RN Outcome: Progressing 10/01/2022 1636 by Joanne Chars, RN Outcome: Progressing   Problem: Metabolic: Goal: Ability to maintain appropriate glucose levels will improve  10/01/2022 1818 by Joanne Chars, RN Outcome: Progressing 10/01/2022 1636 by Joanne Chars, RN Outcome: Progressing   Problem: Nutritional: Goal: Maintenance of adequate nutrition will improve 10/01/2022 1818 by Joanne Chars, RN Outcome: Progressing 10/01/2022 1636 by Joanne Chars, RN Outcome: Progressing Goal: Progress toward achieving an optimal weight will improve 10/01/2022 1818 by Joanne Chars, RN Outcome: Progressing 10/01/2022 1636 by Joanne Chars, RN Outcome: Progressing   Problem: Skin Integrity: Goal: Risk for impaired skin integrity will decrease 10/01/2022 1818 by Joanne Chars, RN Outcome: Progressing 10/01/2022 1636 by Joanne Chars, RN Outcome: Progressing   Problem: Tissue Perfusion: Goal: Adequacy of tissue perfusion will improve 10/01/2022 1818 by Joanne Chars, RN Outcome: Progressing 10/01/2022 1636 by Joanne Chars, RN Outcome: Progressing

## 2022-10-01 NOTE — H&P (Signed)
History and Physical    Patient: Bradley Torres:[id number] DOB: 1961-01-27 DOA: 10/01/2022 DOS: the patient was seen and examined on 10/01/2022 PCP: Mechele Claude, MD  Patient coming from: Home  Chief Complaint:  Chief Complaint  Patient presents with   Abdominal Pain   HPI: Bradley Torres is a 62 y.o. male with medical history of  RLE DVT and PE 2019, PAF on xarelto, OSA, OHS, pulmonary HTN, CAD, NIDT2DM, HTN, chronic HFpEF, CVA, and morbid obesity who was admitted March 2024 for perforated diverticulitis and presented to the ED this morning for LLQ abdominal pain and fever similar to prior episodes of diverticulitis flares. He was afebrile with leukocytosis (WBC 18k), lactic acidemia (2.2), SCr near baseline at 1.84, and CT abd/pelvis demonstrating a large left ventral peritoneal abscess potentially communicating with descending colon consistent with abscess without pneumatosis. General surgery was consulted, recommended IR drain placement, and hospitalist admission requested. He will be given antibiotics, transport to [facility] for CT-guided drain placement, and return for admission at [facility].   Review of Systems: As mentioned in the history of present illness. All other systems reviewed and are negative. Past Medical History:  Diagnosis Date   CAD (coronary artery disease)    a. 12/2017: cath showing 10% Proximal-LAD stenosis with no significant obstructive disease and LVEDP mildly elevated at 18 mm Hg.    Chronic back pain    Diabetes mellitus, type 2 (HCC)    DVT (deep venous thrombosis) (HCC)    Essential hypertension    Family hx of colon cancer 05/03/2017   MI (myocardial infarction) (HCC) 2019   Morbid obesity (HCC)    Panic disorder    Shoulder pain, left    Following fall   Sleep apnea    Stroke (HCC) 01/2018   Vitamin D deficiency    Past Surgical History:  Procedure Laterality Date   Ankle fracture sugery Left    BIOPSY  07/20/2020   Procedure: BIOPSY;  Surgeon: Corbin Ade, MD;  Location: [facility];  Service: Endoscopy;;  cecal polyp   COLONOSCOPY WITH PROPOFOL N/A 05/29/2017   Dr. Jena Gauss: Multiple tubular adenomas removed, prep inadequate.  Short interval surveillance colonoscopy recommended in 6 months   COLONOSCOPY WITH PROPOFOL N/A 07/20/2020   Procedure: COLONOSCOPY WITH PROPOFOL;  Surgeon: Corbin Ade, MD;  Location: [facility];  Service: Endoscopy;  Laterality: N/A;  am appt, early as possible since diabetic   LEFT HEART CATH AND CORONARY ANGIOGRAPHY N/A 01/23/2018   Procedure: LEFT HEART CATH AND CORONARY ANGIOGRAPHY;  Surgeon: Lennette Bihari, MD;  Location: [facility] INV[facility];  Service: Cardiovascular;  Laterality: N/A;   LUMBAR SPINE SURGERY     fusion   POLYPECTOMY  05/29/2017   Procedure: POLYPECTOMY;  Surgeon: Corbin Ade, MD;  Location: [facility];  Service: Endoscopy;;  ascending colon polyp x5-cs transverse colon polyp x4- cs descending colon polyp x1- hs sigmoid colon polyp x1 -hs rectal polyp x1- hs   TONSILLECTOMY     Social History:  reports that he has never smoked. He has never been exposed to tobacco smoke. He has never used smokeless tobacco. He reports current alcohol use. He reports that he does not currently use drugs after having used the following drugs: Marijuana.  Allergies  Allergen Reactions   Actos [Pioglitazone] Cough    Some dyspnea & edema ( possibly some CHF?)   Lisinopril Other (See Comments)    wheezing   Bystolic [Nebivolol Hcl]  Swelling   Ibuprofen Other (See Comments)    Rectal bleed   Spironolactone Hives   Trulicity [Dulaglutide]     Sob, dry cough, GI   Amlodipine Itching and Other (See Comments)    Leg pain.   Diflucan [Fluconazole] Rash   Hctz [Hydrochlorothiazide] Other (See Comments)    Caused gout flares.   Losartan Rash    Family History  Problem Relation Age of Onset   Colon cancer Mother        died at age 58   Heart attack Father    Hepatitis Sister 70        Autoimmune   Heart attack Paternal Grandfather     Prior to Admission medications   Medication Sig Start Date End Date Taking? Authorizing Provider  Accu-Chek Softclix Lancets lancets Use as instructed to monitor glucose twice daily 09/23/21   Dani Gobble, NP  allopurinol (ZYLOPRIM) 100 MG tablet Take 1 tablet (100 mg total) by mouth daily. 02/14/22   Mechele Claude, MD  amiodarone (PACERONE) 200 MG tablet Take 200 mg by mouth daily.    [provider]  atorvastatin (LIPITOR) 20 MG tablet TAKE ONE TABLET BY MOUTH ONCE DAILY 08/01/22   Antoine Poche, MD  chlorthalidone (HYGROTON) 25 MG tablet Take 25 mg by mouth every other day.    [provider]  Cholecalciferol (VITAMIN D3) 250 MCG (10000 UT) capsule Take 10,000 Units by mouth daily.    [provider]  dapagliflozin propanediol (FARXIGA) 10 MG TABS tablet TAKE ONE TABLET BY MOUTH DAILY BEFORE BREAKFAST 10/28/21   Dani Gobble, NP  DHEA 25 MG CAPS Take by mouth daily.    [provider]  docusate sodium (COLACE) 100 MG capsule Take 1 capsule (100 mg total) by mouth 2 (two) times daily. 06/28/22   Osvaldo Shipper, MD  doxycycline (VIBRA-TABS) 100 MG tablet Take 100 mg by mouth daily.    [provider]  gabapentin (NEURONTIN) 300 MG capsule Take 1 capsule by mouth daily as needed (pain). 04/22/20   [provider]  glucose blood (ACCU-CHEK GUIDE) test strip Use as instructed to monitor glucose twice daily 10/28/21   Dani Gobble, NP  hydrALAZINE (APRESOLINE) 25 MG tablet Take 1 tablet (25 mg total) by mouth daily. 09/08/22   Sharlene Dory, NP  metFORMIN (GLUCOPHAGE-XR) 750 MG 24 hr tablet Take 2 tablets (1,500 mg total) by mouth daily with breakfast. Patient taking differently: Take 750 mg by mouth daily with breakfast. 05/11/22   Mechele Claude, MD  oxyCODONE-acetaminophen (PERCOCET) 10-325 MG tablet Take 1 tablet by mouth 3 (three) times daily as needed for pain. 06/28/22    Osvaldo Shipper, MD  polyethylene glycol (MIRALAX / GLYCOLAX) 17 g packet Take 17 g by mouth daily. Patient taking differently: Take 17 g by mouth as needed. 06/28/22   Osvaldo Shipper, MD  rivaroxaban (XARELTO) 20 MG TABS tablet TAKE ONE TABLET BY MOUTH DAILY WITH SUPPER Patient taking differently: Take 20 mg by mouth daily. 02/14/22   Mechele Claude, MD  senna-docusate (SENOKOT-S) 8.6-50 MG tablet Take 2 tablets by mouth at bedtime. Patient taking differently: Take 2 tablets by mouth as needed. 06/28/22   Osvaldo Shipper, MD  sitaGLIPtin (JANUVIA) 100 MG tablet Take 1 tablet (100 mg total) by mouth daily. 10/28/21   Dani Gobble, NP  testosterone cypionate (DEPOTESTOSTERONE CYPIONATE) 200 MG/ML injection Inject 200 mg into the muscle every 14 (fourteen) days. 07/30/21   [provider]  valsartan (DIOVAN)  160 MG tablet Take 160 mg by mouth daily.    [provider]    Physical Exam: Vitals:   10/01/22 1300 10/01/22 1434 10/01/22 1450 10/01/22 1455  BP: (!) 128/98 (!) 104/58 (!) 121/58   Pulse: 88 84 81 88  Resp: 18 (!) 24 (!) 21 20  Temp: 98.9 F (37.2 C)     TempSrc: Oral     SpO2: 97% 96% 98% 93%  Weight:      Height:      Gen: Obese male in no distress watching TV  Pulm: Clear, distant, nonlabored  CV: RRR, no MRG or pitting GI: Soft, +tender in LLQ with no rebound, mild guarding, +BS Neuro: Alert and oriented. No new focal deficits. Ext: Warm, no deformities Skin: No rashes, lesions or ulcers on visualized skin   Data Reviewed: WBC 18k, lactic acid 2.2 > 1.7, hgb 12.5, plt 338, UA +glucose without ketones, +protein dipstick, negative micro. CT: Large ventral left abdominal fluid collection abutting descending colon, no free air on my review.  Assessment and Plan: Diverticulitis with perforation and intraabdominal abscess:  - Continue empiric zosyn - Monitor blood cultures - D/w IR, appreciate assistance in drain placement, request culture.  - General  surgery consulted. Note he is high risk for general anesthesia.  PAF: Currently NSR, had AFib w/RVR in last hospitalization.  - Continue telemetry - Continue amiodarone (confirmed in recent cardiology note) 200mg  daily, restart DOAC once cleared by surgery.   CAD: No angina currently.  - Continue risk factor modification and outpatient follow up.   History of PE, OSA, OHS, pulmonary HTN:  - Encouraged CPAP use and weight loss - Needs to wear SCDs and ambulate frequently while we are forced to hold anticoagulation in the event of urgent surgery.  HTN:  - Chlorthalidone, hydralazine, valsartan on med list, though pt unclear. Will hold for now. Last cardiology OV he reported orthostatic symptoms.  NIDT2DM: Last HbA1c 8.3%.  - SSI - Hold metformin, farxiga and [place] for now (formal med rec pending, pt unclear about medications with me)  HLD: - Will reorder statin once confirmed  Gout: Chronic, quiescent.  - Reorder allopurinol once confirmed  Hypogonadism:  - Due to testosterone injection, will defer to outpatient setting.  Chronic pain:  - Reorder oxycodone-acetaminophen 10-325mg  q6h prn home dose (confirmed on PDMP review)   Morbid obesity: Body mass index is 45.16 kg/m.     Advance Care Planning: Full  Consults: IR, general surgery  Family Communication: None at bedside  Severity of Illness: The appropriate patient status for this patient is INPATIENT. Inpatient status is judged to be reasonable and necessary in order to provide the required intensity of service to ensure the patient's safety. The patient's presenting symptoms, physical exam findings, and initial radiographic and laboratory data in the context of their chronic comorbidities is felt to place them at high risk for further clinical deterioration. Furthermore, it is not anticipated that the patient will be medically stable for discharge from the hospital within 2 midnights of admission.   * I certify that at  the point of admission it is my clinical judgment that the patient will require inpatient hospital care spanning beyond 2 midnights from the point of admission due to high intensity of service, high risk for further deterioration and high frequency of surveillance required.*  Author: [name], MD 10/01/2022 2:57 PM  For on call review [email or web address].

## 2022-10-01 NOTE — Plan of Care (Signed)

## 2022-10-01 NOTE — ED Notes (Signed)
Ice chips given per MD

## 2022-10-01 NOTE — ED Notes (Addendum)
Patient requesting something to eat and drink, informed pt he has a order to remain NPO. Informed MD of request.

## 2022-10-01 NOTE — ED Triage Notes (Signed)
Pt reports abdominal pain that started 2 days ago. Denies fevers. Reports hx of diverticulitis. Denies n/v.

## 2022-10-01 NOTE — ED Provider Notes (Signed)
[place] EMERGENCY DEPARTMENT AT [facility] Provider Note   CSN: [id number] Arrival date & time: 10/01/22  4098     History  Chief Complaint  Patient presents with   Abdominal Pain    Bradley Torres is a 62 y.o. male.   Abdominal Pain Patient with left side abdominal pain.  Is had for the last 2 days.  Similar to previous diverticulitis.  Fever up to 102 at urgent care yesterday.  States was told to come to the ER but there was too long to wait last night.  Now presents this morning.  States he does sort of feel bad all over.  No nausea or vomiting.  Reviewing notes has had previous diverticulitis including abscess.  Initial blood pressure normal but on recheck had decreased some, but MAP remained above 65.    Past Medical History:  Diagnosis Date   CAD (coronary artery disease)    a. 12/2017: cath showing 10% Proximal-LAD stenosis with no significant obstructive disease and LVEDP mildly elevated at 18 mm Hg.    Chronic back pain    Diabetes mellitus, type 2 (HCC)    DVT (deep venous thrombosis) (HCC)    Essential hypertension    Family hx of colon cancer 05/03/2017   MI (myocardial infarction) (HCC) 2019   Morbid obesity (HCC)    Panic disorder    Shoulder pain, left    Following fall   Sleep apnea    Stroke (HCC) 01/2018   Vitamin D deficiency     Home Medications Prior to Admission medications   Medication Sig Start Date End Date Taking? Authorizing Provider  Accu-Chek Softclix Lancets lancets Use as instructed to monitor glucose twice daily 09/23/21   Dani Gobble, NP  allopurinol (ZYLOPRIM) 100 MG tablet Take 1 tablet (100 mg total) by mouth daily. 02/14/22   Mechele Claude, MD  amiodarone (PACERONE) 200 MG tablet Take 200 mg by mouth daily.    [provider]  atorvastatin (LIPITOR) 20 MG tablet TAKE ONE TABLET BY MOUTH ONCE DAILY 08/01/22   Antoine Poche, MD  chlorthalidone (HYGROTON) 25 MG tablet Take 25 mg by mouth every other  day.    [provider]  Cholecalciferol (VITAMIN D3) 250 MCG (10000 UT) capsule Take 10,000 Units by mouth daily.    [provider]  dapagliflozin propanediol (FARXIGA) 10 MG TABS tablet TAKE ONE TABLET BY MOUTH DAILY BEFORE BREAKFAST 10/28/21   Dani Gobble, NP  DHEA 25 MG CAPS Take by mouth daily.    [provider]  docusate sodium (COLACE) 100 MG capsule Take 1 capsule (100 mg total) by mouth 2 (two) times daily. 06/28/22   Osvaldo Shipper, MD  doxycycline (VIBRA-TABS) 100 MG tablet Take 100 mg by mouth daily.    [provider]  gabapentin (NEURONTIN) 300 MG capsule Take 1 capsule by mouth daily as needed (pain). 04/22/20   [provider]  glucose blood (ACCU-CHEK GUIDE) test strip Use as instructed to monitor glucose twice daily 10/28/21   Dani Gobble, NP  hydrALAZINE (APRESOLINE) 25 MG tablet Take 1 tablet (25 mg total) by mouth daily. 09/08/22   Sharlene Dory, NP  metFORMIN (GLUCOPHAGE-XR) 750 MG 24 hr tablet Take 2 tablets (1,500 mg total) by mouth daily with breakfast. Patient taking differently: Take 750 mg by mouth daily with breakfast. 05/11/22   Mechele Claude, MD  oxyCODONE-acetaminophen (PERCOCET) 10-325 MG tablet Take 1 tablet by mouth 3 (three) times daily as needed  for pain. 06/28/22   Osvaldo Shipper, MD  polyethylene glycol (MIRALAX / GLYCOLAX) 17 g packet Take 17 g by mouth daily. Patient taking differently: Take 17 g by mouth as needed. 06/28/22   Osvaldo Shipper, MD  rivaroxaban (XARELTO) 20 MG TABS tablet TAKE ONE TABLET BY MOUTH DAILY WITH SUPPER Patient taking differently: Take 20 mg by mouth daily. 02/14/22   Mechele Claude, MD  senna-docusate (SENOKOT-S) 8.6-50 MG tablet Take 2 tablets by mouth at bedtime. Patient taking differently: Take 2 tablets by mouth as needed. 06/28/22   Osvaldo Shipper, MD  sitaGLIPtin (JANUVIA) 100 MG tablet Take 1 tablet (100 mg total) by mouth daily. 10/28/21   Dani Gobble, NP   testosterone cypionate (DEPOTESTOSTERONE CYPIONATE) 200 MG/ML injection Inject 200 mg into the muscle every 14 (fourteen) days. 07/30/21   [provider]  valsartan (DIOVAN) 160 MG tablet Take 160 mg by mouth daily.    [provider]      Allergies    Actos [pioglitazone], Lisinopril, Bystolic [nebivolol hcl], Ibuprofen, Spironolactone, Trulicity [dulaglutide], Amlodipine, Diflucan [fluconazole], Hctz [hydrochlorothiazide], and Losartan    Review of Systems   Review of Systems  Gastrointestinal:  Positive for abdominal pain.    Physical Exam Updated Vital Signs BP (!) 101/52   Pulse 83   Temp 98.9 F (37.2 C) (Oral)   Resp 20   Ht 6' (1.829 m)   Wt (!) 151 kg   SpO2 98%   BMI 45.16 kg/m  Physical Exam Vitals reviewed.  Constitutional:      Appearance: He is obese.  Abdominal:     Tenderness: There is abdominal tenderness.     Comments: Moderate left-sided tenderness.  Even does feel somewhat full.  No hernia palpated.  Skin:    Capillary Refill: Capillary refill takes less than 2 seconds.  Neurological:     Mental Status: He is alert and oriented to person, place, and time.     ED Results / Procedures / Treatments   Labs (all labs ordered are listed, but only abnormal results are displayed) Labs Reviewed  COMPREHENSIVE METABOLIC PANEL - Abnormal; Notable for the following components:      Result Value   Sodium 133 (*)    Potassium 3.3 (*)    Chloride 96 (*)    Glucose, Bld 199 (*)    BUN 21 (*)    Creatinine, Ser 1.89 (*)    Calcium 8.4 (*)    Albumin 2.9 (*)    AST 13 (*)    GFR, Estimated 40 (*)    All other components within normal limits  CBC - Abnormal; Notable for the following components:   WBC 18.0 (*)    RBC 4.07 (*)    Hemoglobin 12.5 (*)    All other components within normal limits  URINALYSIS, ROUTINE W REFLEX MICROSCOPIC - Abnormal; Notable for the following components:   APPearance HAZY (*)    Specific Gravity, Urine  1.040 (*)    Glucose, UA >=500 (*)    Protein, ur 30 (*)    All other components within normal limits  LACTIC ACID, PLASMA - Abnormal; Notable for the following components:   Lactic Acid, Venous 2.2 (*)    All other components within normal limits  CULTURE, BLOOD (ROUTINE X 2)  CULTURE, BLOOD (ROUTINE X 2)  LIPASE, BLOOD  LACTIC ACID, PLASMA    EKG None  Radiology CT ABDOMEN PELVIS W CONTRAST  Result Date: 10/01/2022 CLINICAL DATA:  Abdominal pain for  2 days. LEFT lower quadrant pain. History of perforated diverticulitis in March 2024. EXAM: CT ABDOMEN AND PELVIS WITH CONTRAST TECHNIQUE: Multidetector CT imaging of the abdomen and pelvis was performed using the standard protocol following bolus administration of intravenous contrast. RADIATION DOSE REDUCTION: This exam was performed according to the departmental dose-optimization program which includes automated exposure control, adjustment of the mA and/or kV according to patient size and/or use of iterative reconstruction technique. CONTRAST:  80mL OMNIPAQUE IOHEXOL 300 MG/ML  SOLN COMPARISON:  CT 06/22/2022 FINDINGS: Lower chest: Lung bases are clear. Hepatobiliary: No focal hepatic lesion. Several small gallstones. No gallbladder inflammation. No biliary duct dilatation. Common bile duct is normal. Pancreas: Pancreas is normal. No ductal dilatation. No pancreatic inflammation. Spleen: Normal spleen Adrenals/urinary tract: Adrenal glands and kidneys are normal. The ureters and bladder normal. Stomach/Bowel: Stomach, duodenum are normal. There is a contained gas in fluid collection in the LEFT ventral peritoneal space measuring 9.3 x 5.8 by 10.1 cm (image 61/series 2) most consistent with a peritoneal abscess. This fluid collection is immediately adjacent to the descending colon along the mesenteric border. Potential communication with the colon on image 58/2. The abscess is also immediately adjacent loops of small bowel in the LEFT upper  quadrant and pushes these loops of small bowel medially. There is no intraperitoneal free air. No pneumatosis or portal venous gas. The distal small bowel is normal. The terminal ileum is normal. Appendix is normal. Ascending and transverse colon normal. There is inflammation along the proximal descending colon as described above. Diverticulosis of the sigmoid colon. Rectum normal. Vascular/Lymphatic: Abdominal aorta is normal caliber with atherosclerotic calcification. There is no retroperitoneal or periportal lymphadenopathy. No pelvic lymphadenopathy. Reproductive: Prostate unremarkable Other: No free fluid. Musculoskeletal: No aggressive osseous lesion. IMPRESSION: 1. Large peritoneal abscess in the LEFT ventral peritoneal space. Potential communication with the descending colon. Findings most consistent with perforated diverticulitis of the descending colon with abscess formation. No intraperitoneal free air. No pneumatosis or portal venous gas. 2. Cholelithiasis without evidence of cholecystitis. 3. Aortic atherosclerosis. Aortic Atherosclerosis (ICD10-I70.0). Electronically Signed   By: Genevive Bi M.D.   On: 10/01/2022 09:54    Procedures Procedures    Medications Ordered in ED Medications  midazolam (VERSED) injection (1 mg Intravenous Given 10/01/22 1450)  fentaNYL (SUBLIMAZE) injection (50 mcg Intravenous Given 10/01/22 1448)  sodium chloride 0.9 % bolus 1,000 mL (0 mLs Intravenous Stopped 10/01/22 1022)  fentaNYL (SUBLIMAZE) injection 50 mcg (50 mcg Intravenous Given 10/01/22 0847)  piperacillin-tazobactam (ZOSYN) IVPB 3.375 g (0 g Intravenous Stopped 10/01/22 1022)  iohexol (OMNIPAQUE) 300 MG/ML solution 80 mL (80 mLs Intravenous Contrast Given 10/01/22 0926)  sodium chloride 0.9 % bolus 1,000 mL (0 mLs Intravenous Stopped 10/01/22 1143)  fentaNYL (SUBLIMAZE) injection 50 mcg (50 mcg Intravenous Given 10/01/22 1328)    ED Course/ Medical Decision Making/ A&P                              Medical Decision Making Amount and/or Complexity of Data Reviewed Labs: ordered. Radiology: ordered.  Risk Prescription drug management. Decision regarding hospitalization.   Patient with left-sided abdominal pain.  History of diverticulitis.  Reviewed previous note and did have perforation.  With decreasing blood pressure is somewhat worrisome.  Will give fluid bolus.  Will check blood work and CT scan.  Cultures will be sent.  Also lactic acid sent.  White count elevated lactic acid mildly elevated.  However does  have CT scan that shows perforated diverticulitis with large abscess.  Potentially communicates with colon.  Discussed with Dr.Pappayliou from general surgery.  Would need percutaneous drain but no plans for surgery.  Potentially could stay up at [facility] but would likely not be able to get drain out.  This weekend.  Will discuss with hospitalist for admission but also discussed with interventional radiology to figure out best way to get drain placed.    Is on anticoagulation but did not take this morning.  Patient has been admitted to internal medicine but will get procedure done at [facility].         Final Clinical Impression(s) / ED Diagnoses Final diagnoses:  Diverticulitis of large intestine with perforation and abscess, unspecified bleeding status    Rx / DC Orders ED Discharge Orders     None         Benjiman Core, MD 10/01/22 1505

## 2022-10-02 DIAGNOSIS — K651 Peritoneal abscess: Secondary | ICD-10-CM | POA: Diagnosis not present

## 2022-10-02 LAB — GLUCOSE, CAPILLARY
Glucose-Capillary: 103 mg/dL — ABNORMAL HIGH (ref 70–99)
Glucose-Capillary: 121 mg/dL — ABNORMAL HIGH (ref 70–99)
Glucose-Capillary: 123 mg/dL — ABNORMAL HIGH (ref 70–99)
Glucose-Capillary: 126 mg/dL — ABNORMAL HIGH (ref 70–99)
Glucose-Capillary: 131 mg/dL — ABNORMAL HIGH (ref 70–99)
Glucose-Capillary: 136 mg/dL — ABNORMAL HIGH (ref 70–99)
Glucose-Capillary: 204 mg/dL — ABNORMAL HIGH (ref 70–99)

## 2022-10-02 LAB — CULTURE, BLOOD (ROUTINE X 2): Culture: NO GROWTH

## 2022-10-02 LAB — BASIC METABOLIC PANEL
Anion gap: 8 (ref 5–15)
BUN: 22 mg/dL — ABNORMAL HIGH (ref 6–20)
CO2: 26 mmol/L (ref 22–32)
Calcium: 7.9 mg/dL — ABNORMAL LOW (ref 8.9–10.3)
Chloride: 98 mmol/L (ref 98–111)
Creatinine, Ser: 1.51 mg/dL — ABNORMAL HIGH (ref 0.61–1.24)
GFR, Estimated: 53 mL/min — ABNORMAL LOW (ref >60)
Glucose, Bld: 134 mg/dL — ABNORMAL HIGH (ref 70–99)
Potassium: 3.2 mmol/L — ABNORMAL LOW (ref 3.5–5.1)
Sodium: 132 mmol/L — ABNORMAL LOW (ref 135–145)

## 2022-10-02 LAB — CBC
HCT: 35.2 % — ABNORMAL LOW (ref 39.0–52.0)
Hemoglobin: 11.3 g/dL — ABNORMAL LOW (ref 13.0–17.0)
MCH: 31.3 pg (ref 26.0–34.0)
MCHC: 32.1 g/dL (ref 30.0–36.0)
MCV: 97.5 fL (ref 80.0–100.0)
Platelets: 315 10*3/uL (ref 150–400)
RBC: 3.61 MIL/uL — ABNORMAL LOW (ref 4.22–5.81)
RDW: 14.7 % (ref 11.5–15.5)
WBC: 12.2 10*3/uL — ABNORMAL HIGH (ref 4.0–10.5)
nRBC: 0 % (ref 0.0–0.2)

## 2022-10-02 MED ORDER — POTASSIUM CHLORIDE CRYS ER 20 MEQ PO TBCR
40.0000 meq | EXTENDED_RELEASE_TABLET | Freq: Once | ORAL | Status: AC
  Administered 2022-10-02: 40 meq via ORAL
  Filled 2022-10-02: qty 2

## 2022-10-02 MED ORDER — RIVAROXABAN 20 MG PO TABS
20.0000 mg | ORAL_TABLET | Freq: Every day | ORAL | Status: DC
  Administered 2022-10-02 – 2022-10-05 (×4): 20 mg via ORAL
  Filled 2022-10-02 (×4): qty 1

## 2022-10-02 NOTE — Progress Notes (Addendum)
[facility] PROGRESS NOTE  Bradley Torres (DOB: 24-May-1960) ZOX:[id number] PCP: Mechele Claude, MD Outpatient Specialists: Surgery, Dr. Lovell Sheehan; Pain Management, Dr. Malen Gauze [facility])  Brief Narrative: Bradley Torres is a 62 y.o. male with a history of RLE DVT and PE 2019, PAF on xarelto, OSA, OHS, pulmonary HTN, CAD, NIDT2DM, HTN, chronic HFpEF, CVA, and morbid obesity who was admitted March 2024 for perforated diverticulitis and presented to the ED 10/01/2022 for LLQ abdominal pain and fever, found to have a large left ventral peritoneal abscess potentially communicating with descending colon consistent with abscess from perforated diverticulitis without pneumatosis. General surgery was consulted, IR performed CT-guided drain placement on 7/6 with 140cc thick purulent fluid output sent for gram stain and culture. He continues to require IV pain medications. Leukocytosis decreasing on zosyn.   Subjective: Pain improved with IV dilaudid but needing it every 4 hours or so. He's nervous about missing pain management appt tomorrow. Eating ok. Has been having periodic sweating.  Objective: BP 104/60 (BP Location: Left Wrist)   Pulse 70   Temp 97.6 F (36.4 C)   Resp 20   Ht 6' (1.829 m)   Wt (!) 151 kg   SpO2 96%   BMI 45.16 kg/m   Gen: No distress Pulm: Clear, nonlabored  CV: RRR, no MRG or edema GI: Soft, obese, tender in LLQ without rebound or guarding. Left abdominal JP drain site dressing is c/d/i Neuro: Alert and oriented. No new focal deficits. Ext: Warm, no deformities. Skin: No other rashes, lesions or ulcers on visualized skin   Assessment & Plan: Diverticulitis with perforation and intraabdominal abscess: s/p drain placement 7/6. - Continue empiric zosyn, WBC down, fever curve improved.  - Monitor abscess culture (pending) with gram stain showing polymicrobial presence (GPR, GNR, GPC) - Monitor blood cultures from 7/6 (NGTD) - continue monitoring JP output,  significant overnight, flush drain at least qshift.  - General surgery consulted. Note he is high risk for general anesthesia. Has relationship with Dr. Lovell Sheehan.   PAF: Currently NSR, had AFib w/RVR in last hospitalization.  - Continue telemetry - Continue amiodarone (confirmed in recent cardiology note) 200mg  daily, restart xarelto   CAD: No angina currently.  - Continue risk factor modification and outpatient follow up.    History of PE, OSA, OHS, pulmonary HTN:  - Encouraged CPAP use and weight loss - Needs to wear SCDs and ambulate frequently, restarting DOAC.   HTN:  - Chlorthalidone, hydralazine, valsartan on med list, though pt unclear. Will hold for now since normotensive. Last cardiology OV he reported orthostatic symptoms.   NIDT2DM: Last HbA1c 8.3%.  - SSI - Hold metformin, farxiga and [place] for now    AKI:  - Improved with fluids, continue monitoring, avoid nephrotoxins.  Hypokalemia:  - supplement and monitor   HLD: - Will reorder statin once confirmed   Gout: Chronic, quiescent.  - Reorder allopurinol once confirmed   Hypogonadism:  - Due to testosterone injection, will defer to outpatient setting.   Chronic pain:  - Reordered oxycodone-acetaminophen 10-325mg  q6h prn home dose (confirmed on PDMP review) - Follow up with Dr. Malen Gauze per routine, will miss regularly scheduled appt tomorrow due to inpatient admission, will call in AM to reschedule.   Morbid obesity: Body mass index is 45.16 kg/m.   [name], MD [facility] [email or web address] 10/02/2022, 10:25 AM

## 2022-10-02 NOTE — Progress Notes (Signed)
   10/02/22 1135  TOC Brief Assessment  Insurance and Status Reviewed (Medicaid)  Patient has primary care physician Yes  Home environment has been reviewed Home  Prior level of function: Independent  Prior/Current Home Services No current home services  Social Determinants of Health Reivew SDOH reviewed no interventions necessary  Readmission risk has been reviewed Yes (Yellow)  Transition of care needs no transition of care needs at this time

## 2022-10-02 NOTE — Progress Notes (Signed)
Patient has been in bed most of shift and using urinal. I have spoken to the patient and expressed the importance of attempting to sit in chair and ambulating in room. JP drain intact with small amount of serosanguinous drainage. Medicated for pain X3 on this shift.

## 2022-10-02 NOTE — Progress Notes (Signed)
JP drainage purulent, bloody, cloudy, and has a foul odor. JP drain emptied x 4. PRN Percocet and Dilaudid given multiple times during the night for reports of severe pain.

## 2022-10-03 ENCOUNTER — Ambulatory Visit: Payer: Medicaid Other | Admitting: Family Medicine

## 2022-10-03 DIAGNOSIS — K651 Peritoneal abscess: Secondary | ICD-10-CM | POA: Diagnosis not present

## 2022-10-03 DIAGNOSIS — K5720 Diverticulitis of large intestine with perforation and abscess without bleeding: Secondary | ICD-10-CM | POA: Diagnosis not present

## 2022-10-03 LAB — CULTURE, BLOOD (ROUTINE X 2)
Culture: NO GROWTH
Special Requests: ADEQUATE

## 2022-10-03 LAB — BASIC METABOLIC PANEL
Anion gap: 9 (ref 5–15)
BUN: 19 mg/dL (ref 6–20)
CO2: 28 mmol/L (ref 22–32)
Calcium: 8.2 mg/dL — ABNORMAL LOW (ref 8.9–10.3)
Chloride: 96 mmol/L — ABNORMAL LOW (ref 98–111)
Creatinine, Ser: 1.41 mg/dL — ABNORMAL HIGH (ref 0.61–1.24)
GFR, Estimated: 57 mL/min — ABNORMAL LOW (ref >60)
Glucose, Bld: 128 mg/dL — ABNORMAL HIGH (ref 70–99)
Potassium: 3.1 mmol/L — ABNORMAL LOW (ref 3.5–5.1)
Sodium: 133 mmol/L — ABNORMAL LOW (ref 135–145)

## 2022-10-03 LAB — GLUCOSE, CAPILLARY
Glucose-Capillary: 109 mg/dL — ABNORMAL HIGH (ref 70–99)
Glucose-Capillary: 124 mg/dL — ABNORMAL HIGH (ref 70–99)
Glucose-Capillary: 127 mg/dL — ABNORMAL HIGH (ref 70–99)
Glucose-Capillary: 132 mg/dL — ABNORMAL HIGH (ref 70–99)
Glucose-Capillary: 136 mg/dL — ABNORMAL HIGH (ref 70–99)
Glucose-Capillary: 149 mg/dL — ABNORMAL HIGH (ref 70–99)
Glucose-Capillary: 181 mg/dL — ABNORMAL HIGH (ref 70–99)

## 2022-10-03 LAB — AEROBIC/ANAEROBIC CULTURE W GRAM STAIN (SURGICAL/DEEP WOUND)

## 2022-10-03 LAB — CBC
HCT: 36.5 % — ABNORMAL LOW (ref 39.0–52.0)
Hemoglobin: 11.5 g/dL — ABNORMAL LOW (ref 13.0–17.0)
MCH: 31.3 pg (ref 26.0–34.0)
MCHC: 31.5 g/dL (ref 30.0–36.0)
MCV: 99.2 fL (ref 80.0–100.0)
Platelets: 334 10*3/uL (ref 150–400)
RBC: 3.68 MIL/uL — ABNORMAL LOW (ref 4.22–5.81)
RDW: 14.6 % (ref 11.5–15.5)
WBC: 11 10*3/uL — ABNORMAL HIGH (ref 4.0–10.5)
nRBC: 0 % (ref 0.0–0.2)

## 2022-10-03 MED ORDER — SENNOSIDES-DOCUSATE SODIUM 8.6-50 MG PO TABS
2.0000 | ORAL_TABLET | Freq: Every day | ORAL | Status: DC
  Administered 2022-10-03 – 2022-10-06 (×4): 2 via ORAL
  Filled 2022-10-03 (×4): qty 2

## 2022-10-03 MED ORDER — MAGNESIUM SULFATE IN D5W 1-5 GM/100ML-% IV SOLN
1.0000 g | Freq: Once | INTRAVENOUS | Status: AC
  Administered 2022-10-03: 1 g via INTRAVENOUS
  Filled 2022-10-03: qty 100

## 2022-10-03 MED ORDER — ATORVASTATIN CALCIUM 20 MG PO TABS
20.0000 mg | ORAL_TABLET | Freq: Every day | ORAL | Status: DC
  Administered 2022-10-03 – 2022-10-06 (×4): 20 mg via ORAL
  Filled 2022-10-03 (×4): qty 1

## 2022-10-03 MED ORDER — POTASSIUM CHLORIDE CRYS ER 20 MEQ PO TBCR
40.0000 meq | EXTENDED_RELEASE_TABLET | Freq: Once | ORAL | Status: AC
  Administered 2022-10-03: 40 meq via ORAL
  Filled 2022-10-03: qty 2

## 2022-10-03 MED ORDER — POLYETHYLENE GLYCOL 3350 17 G PO PACK
17.0000 g | PACK | Freq: Every day | ORAL | Status: DC
  Administered 2022-10-03 – 2022-10-05 (×3): 17 g via ORAL
  Filled 2022-10-03 (×4): qty 1

## 2022-10-03 MED ORDER — ALLOPURINOL 100 MG PO TABS
100.0000 mg | ORAL_TABLET | Freq: Every day | ORAL | Status: DC
  Administered 2022-10-03 – 2022-10-06 (×4): 100 mg via ORAL
  Filled 2022-10-03 (×4): qty 1

## 2022-10-03 NOTE — Progress Notes (Signed)
Patient did not sleep much during the night. Given prn medications x4. JP drain dressing changed. Output for JP was 10ml total for this shift. Patient remains in bed and using the urinal. Plan of care ongoing.

## 2022-10-03 NOTE — Progress Notes (Signed)
Patient up in chair for 2 hours today and assisted to bathroom X2. Patient for possible discharge this week and I have encouraged him to to increase time out of bed when possible. We also discussed pain management today and he is hoping to use po pain medication and decrease the amount of IV pain medication.

## 2022-10-03 NOTE — Consult Note (Signed)
Reason for Consult: Perforated diverticulitis with abscess Referring Physician: Dr. Benjiman Core   Bradley Torres is an 62 y.o. male.  HPI: Bradley Torres is a 62 yo male with a PMHx of DVT & PE, CAD, HFpEF, PAF, CVA, HTN, T2DM, CKD and diverticulitis admitted for diverticulitis with perforation and intra-abdominal abscess. He presented to the ED with worsening constant localized LLQ pain rated a 9/10. He reported that he has had LLQ pain since his last diverticulitis flare in March 2024, but it has significantly worsened. He endorsed fevers up to 102. He denied chills, weight loss, SOB, chest pain, palpitations, nausea, vomiting, diarrhea and dysuria.   On admission his CT scan showed perforated diverticulitis with large left ventral peritoneal abscess and possible communication with descending colon. He initially had leukocytosis to 18 and elevated lactic acid to 2.2. He had an IR CT-guided drain placed on 7/6. He was started on IV Zosyn.   Today, he states his pain is better. Rating his pain a 5/10 with Dilaudid and percocet. His pain increases with movement, but is tolerable when laying still. He has not had a BM since Friday, but is passing flatus. He has not gotten out of bed since admission because he is "hooked up like a Buick", referring to the wires. He is eating and drinking well without problems. He denied chills, weight loss, SOB, chest pain, palpitations, nausea, vomiting, diarrhea and dysuria. His lactic acid has normalized and his leukocytosis has decreased to 11.   Past Medical History:  Diagnosis Date   CAD (coronary artery disease)    a. 12/2017: cath showing 10% Proximal-LAD stenosis with no significant obstructive disease and LVEDP mildly elevated at 18 mm Hg.    Chronic back pain    Diabetes mellitus, type 2 (HCC)    DVT (deep venous thrombosis) (HCC)    Essential hypertension    Family hx of colon cancer 05/03/2017   MI (myocardial infarction) (HCC) 2019   Morbid  obesity (HCC)    Panic disorder    Shoulder pain, left    Following fall   Sleep apnea    Stroke (HCC) 01/2018   Vitamin D deficiency     Past Surgical History:  Procedure Laterality Date   Ankle fracture sugery Left    BIOPSY  07/20/2020   Procedure: BIOPSY;  Surgeon: Corbin Ade, MD;  Location: [facility];  Service: Endoscopy;;  cecal polyp   COLONOSCOPY WITH PROPOFOL N/A 05/29/2017   Dr. Jena Gauss: Multiple tubular adenomas removed, prep inadequate.  Short interval surveillance colonoscopy recommended in 6 months   COLONOSCOPY WITH PROPOFOL N/A 07/20/2020   Procedure: COLONOSCOPY WITH PROPOFOL;  Surgeon: Corbin Ade, MD;  Location: [facility];  Service: Endoscopy;  Laterality: N/A;  am appt, early as possible since diabetic   LEFT HEART CATH AND CORONARY ANGIOGRAPHY N/A 01/23/2018   Procedure: LEFT HEART CATH AND CORONARY ANGIOGRAPHY;  Surgeon: Lennette Bihari, MD;  Location: [facility];  Service: Cardiovascular;  Laterality: N/A;   LUMBAR SPINE SURGERY     fusion   POLYPECTOMY  05/29/2017   Procedure: POLYPECTOMY;  Surgeon: Corbin Ade, MD;  Location: [facility];  Service: Endoscopy;;  ascending colon polyp x5-cs transverse colon polyp x4- cs descending colon polyp x1- hs sigmoid colon polyp x1 -hs rectal polyp x1- hs   TONSILLECTOMY      Family History  Problem Relation Age of Onset   Colon cancer Mother  died at age 22   Heart attack Father    Hepatitis Sister 4       Autoimmune   Heart attack Paternal Grandfather     Social History:  reports that he has never smoked. He has never been exposed to tobacco smoke. He has never used smokeless tobacco. He reports current alcohol use. He reports that he does not currently use drugs after having used the following drugs: Marijuana.  Allergies:  Allergies  Allergen Reactions   Actos [Pioglitazone] Other (See Comments) and Cough    Dyspnea  Edema   Zestril [Lisinopril] Other (See Comments) and  Cough    Wheezing    Aldactone [Spironolactone] Hives   Bystolic [Nebivolol Hcl] Swelling   Motrin [Ibuprofen] Other (See Comments)    Rectal bleed   Trulicity [Dulaglutide] Other (See Comments) and Cough    Shortness of breath GI intolerance   Cozaar [Losartan] Rash   Diflucan [Fluconazole] Rash   Hctz [Hydrochlorothiazide] Other (See Comments)    Gout flares.   Norvasc [Amlodipine] Itching and Other (See Comments)    Myalgias     Medications: I have reviewed the patient's current medications.  Results for orders placed or performed during the hospital encounter of 10/01/22 (from the past 48 hour(s))  Urinalysis, Routine w reflex microscopic -Urine, Clean Catch     Status: Abnormal   Collection Time: 10/01/22 10:50 AM  Result Value Ref Range   Color, Urine YELLOW YELLOW   APPearance HAZY (A) CLEAR   Specific Gravity, Urine 1.040 (H) 1.005 - 1.030   pH 5.0 5.0 - 8.0   Glucose, UA >=500 (A) NEGATIVE mg/dL   Hgb urine dipstick NEGATIVE NEGATIVE   Bilirubin Urine NEGATIVE NEGATIVE   Ketones, ur NEGATIVE NEGATIVE mg/dL   Protein, ur 30 (A) NEGATIVE mg/dL   Nitrite NEGATIVE NEGATIVE   Leukocytes,Ua NEGATIVE NEGATIVE   RBC / HPF 0-5 0 - 5 RBC/hpf   WBC, UA 0-5 0 - 5 WBC/hpf   Bacteria, UA NONE SEEN NONE SEEN   Squamous Epithelial / HPF 0-5 0 - 5 /HPF    Comment: Performed at [facility], [address], [place], [place] [address]  Aerobic/Anaerobic Culture w Gram Stain (surgical/deep wound)     Status: Abnormal (Preliminary result)   Collection Time: 10/01/22  3:17 PM   Specimen: Abscess  Result Value Ref Range   Specimen Description ABSCESS    Special Requests ABDOMEN    Gram Stain      RARE WBC PRESENT, PREDOMINANTLY PMN ABUNDANT GRAM POSITIVE COCCI ABUNDANT GRAM NEGATIVE RODS FEW GRAM POSITIVE RODS Performed at [facility], [address], [place], [place] [address]    Culture MULTIPLE ORGANISMS PRESENT, NONE PREDOMINANT (A)    Report Status PENDING    Glucose, capillary     Status: Abnormal   Collection Time: 10/01/22  5:23 PM  Result Value Ref Range   Glucose-Capillary 138 (H) 70 - 99 mg/dL    Comment: Glucose reference range applies only to samples taken after fasting for at least 8 hours.  Glucose, capillary     Status: Abnormal   Collection Time: 10/01/22  8:42 PM  Result Value Ref Range   Glucose-Capillary 169 (H) 70 - 99 mg/dL    Comment: Glucose reference range applies only to samples taken after fasting for at least 8 hours.   Comment 1 Notify RN    Comment 2 Document in Chart   Glucose, capillary     Status: Abnormal  Collection Time: 10/02/22 12:24 AM  Result Value Ref Range   Glucose-Capillary 103 (H) 70 - 99 mg/dL    Comment: Glucose reference range applies only to samples taken after fasting for at least 8 hours.  Glucose, capillary     Status: Abnormal   Collection Time: 10/02/22  3:38 AM  Result Value Ref Range   Glucose-Capillary 123 (H) 70 - 99 mg/dL    Comment: Glucose reference range applies only to samples taken after fasting for at least 8 hours.  Basic metabolic panel     Status: Abnormal   Collection Time: 10/02/22  4:58 AM  Result Value Ref Range   Sodium 132 (L) 135 - 145 mmol/L   Potassium 3.2 (L) 3.5 - 5.1 mmol/L   Chloride 98 98 - 111 mmol/L   CO2 26 22 - 32 mmol/L   Glucose, Bld 134 (H) 70 - 99 mg/dL    Comment: Glucose reference range applies only to samples taken after fasting for at least 8 hours.   BUN 22 (H) 6 - 20 mg/dL   Creatinine, Ser 2.95 (H) 0.61 - 1.24 mg/dL   Calcium 7.9 (L) 8.9 - 10.3 mg/dL   GFR, Estimated 53 (L) >60 mL/min    Comment: (NOTE) Calculated using the CKD-EPI Creatinine Equation (2021)    Anion gap 8 5 - 15    Comment: Performed at [facility], [address] [place], [place] [address]  CBC     Status: Abnormal   Collection Time: 10/02/22  4:58 AM  Result Value Ref Range   WBC 12.2 (H) 4.0 - 10.5 K/uL   RBC 3.61 (L) 4.22 - 5.81 MIL/uL   Hemoglobin 11.3 (L)  13.0 - 17.0 g/dL   HCT 86.5 (L) 78.4 - 69.6 %   MCV 97.5 80.0 - 100.0 fL   MCH 31.3 26.0 - 34.0 pg   MCHC 32.1 30.0 - 36.0 g/dL   RDW 29.5 28.4 - 13.2 %   Platelets 315 150 - 400 K/uL   nRBC 0.0 0.0 - 0.2 %    Comment: Performed at [facility], [address], [place], [place] [address]  Glucose, capillary     Status: Abnormal   Collection Time: 10/02/22  7:28 AM  Result Value Ref Range   Glucose-Capillary 121 (H) 70 - 99 mg/dL    Comment: Glucose reference range applies only to samples taken after fasting for at least 8 hours.   Comment 1 Notify RN    Comment 2 Document in Chart   Glucose, capillary     Status: Abnormal   Collection Time: 10/02/22 11:25 AM  Result Value Ref Range   Glucose-Capillary 204 (H) 70 - 99 mg/dL    Comment: Glucose reference range applies only to samples taken after fasting for at least 8 hours.   Comment 1 Notify RN    Comment 2 Document in Chart   Glucose, capillary     Status: Abnormal   Collection Time: 10/02/22  4:02 PM  Result Value Ref Range   Glucose-Capillary 131 (H) 70 - 99 mg/dL    Comment: Glucose reference range applies only to samples taken after fasting for at least 8 hours.   Comment 1 Notify RN    Comment 2 Document in Chart   Glucose, capillary     Status: Abnormal   Collection Time: 10/02/22  7:51 PM  Result Value Ref Range   Glucose-Capillary 136 (H) 70 - 99 mg/dL    Comment: Glucose reference range applies  only to samples taken after fasting for at least 8 hours.   Comment 1 Notify RN    Comment 2 Document in Chart   Glucose, capillary     Status: Abnormal   Collection Time: 10/02/22 11:45 PM  Result Value Ref Range   Glucose-Capillary 126 (H) 70 - 99 mg/dL    Comment: Glucose reference range applies only to samples taken after fasting for at least 8 hours.  Glucose, capillary     Status: Abnormal   Collection Time: 10/03/22 12:18 AM  Result Value Ref Range   Glucose-Capillary 109 (H) 70 - 99 mg/dL    Comment: Glucose  reference range applies only to samples taken after fasting for at least 8 hours.  Basic metabolic panel     Status: Abnormal   Collection Time: 10/03/22  3:53 AM  Result Value Ref Range   Sodium 133 (L) 135 - 145 mmol/L   Potassium 3.1 (L) 3.5 - 5.1 mmol/L   Chloride 96 (L) 98 - 111 mmol/L   CO2 28 22 - 32 mmol/L   Glucose, Bld 128 (H) 70 - 99 mg/dL    Comment: Glucose reference range applies only to samples taken after fasting for at least 8 hours.   BUN 19 6 - 20 mg/dL   Creatinine, Ser 1.61 (H) 0.61 - 1.24 mg/dL   Calcium 8.2 (L) 8.9 - 10.3 mg/dL   GFR, Estimated 57 (L) >60 mL/min    Comment: (NOTE) Calculated using the CKD-EPI Creatinine Equation (2021)    Anion gap 9 5 - 15    Comment: Performed at [facility], [address], [place], [place] [address]  CBC     Status: Abnormal   Collection Time: 10/03/22  3:53 AM  Result Value Ref Range   WBC 11.0 (H) 4.0 - 10.5 K/uL   RBC 3.68 (L) 4.22 - 5.81 MIL/uL   Hemoglobin 11.5 (L) 13.0 - 17.0 g/dL   HCT 54.0 (L) 98.1 - 19.1 %   MCV 99.2 80.0 - 100.0 fL   MCH 31.3 26.0 - 34.0 pg   MCHC 31.5 30.0 - 36.0 g/dL   RDW 47.8 29.5 - 62.1 %   Platelets 334 150 - 400 K/uL   nRBC 0.0 0.0 - 0.2 %    Comment: Performed at [facility], [address], [place], [place] [address]  Glucose, capillary     Status: Abnormal   Collection Time: 10/03/22  3:53 AM  Result Value Ref Range   Glucose-Capillary 124 (H) 70 - 99 mg/dL    Comment: Glucose reference range applies only to samples taken after fasting for at least 8 hours.  Glucose, capillary     Status: Abnormal   Collection Time: 10/03/22  7:57 AM  Result Value Ref Range   Glucose-Capillary 132 (H) 70 - 99 mg/dL    Comment: Glucose reference range applies only to samples taken after fasting for at least 8 hours.    CT GUIDED PERITONEAL/RETROPERITONEAL FLUID DRAIN BY PERC CATH  Result Date: 10/01/2022 INDICATION: 62 year old male with perforated diverticulitis and associated diverticular  abscess. He presents for drain placement EXAM: CT-guided drain placement MEDICATIONS: The patient is currently admitted to the hospital and receiving intravenous antibiotics. The antibiotics were administered within an appropriate time frame prior to the initiation of the procedure. ANESTHESIA/SEDATION: Moderate (conscious) sedation was employed during this procedure. A total of Versed 2 mg and Fentanyl 100 mcg was administered intravenously by the radiology nurse. Total intra-service moderate Sedation Time: 13 minutes.  The patient's level of consciousness and vital signs were monitored continuously by radiology nursing throughout the procedure under my direct supervision. COMPLICATIONS: None immediate. PROCEDURE: Informed written consent was obtained from the patient after a thorough discussion of the procedural risks, benefits and alternatives. All questions were addressed. Maximal Sterile Barrier Technique was utilized including caps, mask, sterile gowns, sterile gloves, sterile drape, hand hygiene and skin antiseptic. A timeout was performed prior to the initiation of the procedure. A planning axial CT scan was performed. The abscess collection in the left lower quadrant was successfully identified. A suitable skin entry site was selected and marked. Local anesthesia was attained by infiltration with 1% lidocaine. A small dermatotomy was made. Under intermittent CT guidance, an 18 gauge trocar needle was advanced into the fluid collection. A 0.035 wire was then coiled in the collection. The skin tract was dilated to 12 [place]. A 12 French all-purpose drainage catheter was advanced over the wire and formed. Approximately 140 mL of thick purulent fluid was then successfully aspirated. Aspirated fluid was sent for Gram stain and culture. The drain was lavaged and connected to JP bulb suction. The drainage catheter was then secured to the skin with 0 Prolene suture. Sterile bandages were placed. Post placement CT  imaging demonstrates a well-positioned drainage catheter with no evidence of complication. The patient tolerated the procedure well. IMPRESSION: Successful placement of a 12 French drainage catheter into the left lower quadrant diverticular abscess with removal of 140 mL thick purulent fluid. A sample was sent for Gram stain and culture. Recommend keeping drain to JP bulb suction and flushing at least once per shift. Electronically Signed   By: Malachy Moan M.D.   On: 10/01/2022 15:34    ROS:  Pertinent items noted in HPI and remainder of comprehensive ROS otherwise negative.  Blood pressure 135/79, pulse 65, temperature 97.8 F (36.6 C), resp. rate 20, height 6' (1.829 m), weight (!) 151 kg, SpO2 95 %. Physical Exam:   General: Laying in bed in no acute distress. Wiping sweat off of forehead from Dilaudid.  HEENT: Normocephalic, atraumatic.  Respiratory: Clear to auscultation bilaterally. Normal work of breathing.  Cardiovascular: Regular rate and rhythm. No murmurs.  Abdominal: Soft, non-distended abdomen with tenderness in the LUQ and LLQ. No rebound or guarding. Normal bowel sounds.  Extremities: No edema.   Assessment/Plan: Bradley Torres is admitted for perforated diverticulitis with abscess. His vital signs are stable and he remains afebrile. His leukocytosis is resolving with the antibiotics and drain. We will continue the IV Zosyn while in the hospital and the pain control with Dilaudid and Percocet. At this time he is not a surgical candidate. He will be referred to the drain clinic to evaluate and follow the abscess with CT. After resolution, Dr. Lovell Sheehan will see him in clinic to discuss next steps such as surgery at a tertiary center to prevent recurrence. We will continue to follow while in the hospital, but from a surgical perspective he can be discharged.   Camille Bal 10/03/2022, 10:16 AM

## 2022-10-03 NOTE — Progress Notes (Signed)
[facility] PROGRESS NOTE  Bradley Torres (DOB: February 27, 1961) ZOX:[id number] PCP: Bradley Claude, MD Outpatient Specialists: Surgery, Dr. Lovell Torres; Pain Management, Dr. Malen Torres [facility])  Brief Narrative: Bradley Torres is a 62 y.o. male with a history of RLE DVT and PE 2019, PAF on xarelto, OSA, OHS, pulmonary HTN, CAD, NIDT2DM, HTN, chronic HFpEF, CVA, and morbid obesity who was admitted March 2024 for perforated diverticulitis and presented to the ED 10/01/2022 for LLQ abdominal pain and fever, found to have a large left ventral peritoneal abscess potentially communicating with descending colon consistent with abscess from perforated diverticulitis without pneumatosis. General surgery was consulted, IR performed CT-guided drain placement on 7/6 with 140cc thick purulent fluid output sent for gram stain and culture. He continues to require IV pain medications. Leukocytosis decreasing on zosyn.   Subjective: Feels fine, has rescheduled pain clinic appt for Friday. Hasn't been getting up, "I'm hooked up like a Buick." but does want to. No BM, +flatus. Required IV dilaudid x4 overnight.  Objective: BP 135/79 (BP Location: Right Wrist)   Pulse 65   Temp 97.8 F (36.6 C)   Resp 20   Ht 6' (1.829 m)   Wt (!) 151 kg   SpO2 95%   BMI 45.16 kg/m   Gen: No distress Pulm: Clear, nonlabored  CV: RRR, no MRG GI: Soft, tender to deep palpation in LLQ, JP drain site c/d/I. No rebound, +BS. Scant cloudy brown JP output in drain Neuro: Alert and oriented. No new focal deficits. Ext: Warm, no deformities. Skin: No rashes, lesions or ulcers on visualized skin   Assessment & Plan: Diverticulitis with perforation and intraabdominal abscess: s/p drain placement 7/6. - Continue empiric zosyn, WBC down, remains afebrile.  - Abscess culture is polymicrobial. Will continue broad empiric treatment. - Monitor blood cultures from 7/6 (remain NGTD) - Continue monitoring JP output, declining, exam  stable. Flush drain at least qshift.  - General surgery consulted. Note he is high risk for general anesthesia. Has relationship with Dr. Lovell Torres. - continue baseline percocet and prn IV dilaudid which the patient continues to use. - Add senokot, miralax   PAF: Currently NSR, had AFib w/RVR in last hospitalization but telemetry thus far has only shown NSR w/occasional PACs.  - DC tele to facilitate mobility - Continue amiodarone 200mg  daily, xarelto   CAD: No angina currently.  - Continue risk factor modification and outpatient follow up.    History of PE, OSA, OHS, pulmonary HTN:  - Encouraged CPAP use and weight loss - Continue DOAC.   HTN:  - Chlorthalidone, hydralazine, valsartan on med list, though pt normotensive and last cardiology OV he reported orthostatic symptoms. Holding these medications, will check orthostatic BP. Restart BP meds stepwise most likely.   NIDT2DM: Last HbA1c 8.3%.  - SSI, at inpatient goal - Hold metformin, farxiga and [place] for now    AKI:  - Improved with fluids, continue monitoring, avoid nephrotoxins.  Hypokalemia:  - Supplement again today for total given his AFib and monitor.   HLD: - Statin   Gout: Chronic, quiescent.  - Reorder allopurinol once confirmed   Hypogonadism:  - Due to testosterone injection, will defer to outpatient setting.   Chronic pain:  - Reordered oxycodone-acetaminophen 10-325mg  q6h prn home dose (confirmed on PDMP review) - Follow up with Dr. Malen Torres per routine, rescheduled appt to Friday 7/12. Would require Rx for baseline medications to make it to that appointment.   Morbid obesity: Body mass index is 45.16 kg/m.  Bradley Nine, MD [facility] [email or web address] 10/03/2022, 9:26 AM

## 2022-10-04 DIAGNOSIS — K5720 Diverticulitis of large intestine with perforation and abscess without bleeding: Secondary | ICD-10-CM | POA: Diagnosis not present

## 2022-10-04 DIAGNOSIS — K651 Peritoneal abscess: Secondary | ICD-10-CM | POA: Diagnosis not present

## 2022-10-04 LAB — BASIC METABOLIC PANEL
Anion gap: 10 (ref 5–15)
BUN: 17 mg/dL (ref 6–20)
CO2: 27 mmol/L (ref 22–32)
Calcium: 8.4 mg/dL — ABNORMAL LOW (ref 8.9–10.3)
Chloride: 97 mmol/L — ABNORMAL LOW (ref 98–111)
Creatinine, Ser: 1.45 mg/dL — ABNORMAL HIGH (ref 0.61–1.24)
GFR, Estimated: 55 mL/min — ABNORMAL LOW (ref >60)
Glucose, Bld: 106 mg/dL — ABNORMAL HIGH (ref 70–99)
Potassium: 4.1 mmol/L (ref 3.5–5.1)
Sodium: 134 mmol/L — ABNORMAL LOW (ref 135–145)

## 2022-10-04 LAB — CBC
HCT: 37.5 % — ABNORMAL LOW (ref 39.0–52.0)
Hemoglobin: 11.8 g/dL — ABNORMAL LOW (ref 13.0–17.0)
MCH: 31.1 pg (ref 26.0–34.0)
MCHC: 31.5 g/dL (ref 30.0–36.0)
MCV: 98.7 fL (ref 80.0–100.0)
Platelets: 370 10*3/uL (ref 150–400)
RBC: 3.8 MIL/uL — ABNORMAL LOW (ref 4.22–5.81)
RDW: 14.9 % (ref 11.5–15.5)
WBC: 11.9 10*3/uL — ABNORMAL HIGH (ref 4.0–10.5)
nRBC: 0 % (ref 0.0–0.2)

## 2022-10-04 LAB — GLUCOSE, CAPILLARY
Glucose-Capillary: 117 mg/dL — ABNORMAL HIGH (ref 70–99)
Glucose-Capillary: 130 mg/dL — ABNORMAL HIGH (ref 70–99)
Glucose-Capillary: 164 mg/dL — ABNORMAL HIGH (ref 70–99)
Glucose-Capillary: 109 mg/dL — ABNORMAL HIGH (ref 70–99)
Glucose-Capillary: 98 mg/dL (ref 70–99)

## 2022-10-04 LAB — MAGNESIUM: Magnesium: 2.1 mg/dL (ref 1.7–2.4)

## 2022-10-04 NOTE — Progress Notes (Signed)
  Subjective: He reports that his pain is less controlled this morning as he is not using IV Dilaudid so that he can hopefully go home today. His LLQ pain is constant, dull and rated a 9/10. After the Oxy IR and Percocet it decreases to a 7/10. The pain is tolerable when laying in bed, but increases with movement and sitting up. He denied nausea, vomiting or diarrhea. He is able to tolerate a regular diet without issue. He has not had a BM since Friday, but is passing flatus.    Objective: Vital signs in last 24 hours: Temp:  [97.9 F (36.6 C)-98.4 F (36.9 C)] 97.9 F (36.6 C) (07/09 0352) Pulse Rate:  [74-75] 75 (07/09 0352) Resp:  [20] 20 (07/09 0352) BP: (125-136)/(70-89) 125/70 (07/09 0352) SpO2:  [94 %-95 %] 94 % (07/09 0352) Last BM Date : 09/30/22 (per patient)  Intake/Output from previous day: 07/08 0701 - 07/09 0700 In: 240 [P.O.:240] Out: 1365 [Urine:1350; Drains:15] Intake/Output this shift: Total I/O In: 240 [P.O.:240] Out: 960 [Urine:950; Drains:10]  General appearance: alert and no distress Head: Normocephalic, without obvious abnormality, atraumatic Resp: clear to auscultation bilaterally and normal work of breathing Cardio: regular rate and rhythm, S1, S2 normal, no murmur, click, rub or gallop GI: Soft, non-distended abdomen. Tender to palpation in LUQ and LLQ. No rebound or guarding.  Extremities: extremities normal, atraumatic, no cyanosis or edema Drain had a small amount of serosanguinous fluid in it.   Lab Results:  Recent Labs    10/03/22 0353 10/04/22 0404  WBC 11.0* 11.9*  HGB 11.5* 11.8*  HCT 36.5* 37.5*  PLT 334 370   BMET Recent Labs    10/03/22 0353 10/04/22 0404  NA 133* 134*  K 3.1* 4.1  CL 96* 97*  CO2 28 27  GLUCOSE 128* 106*  BUN 19 17  CREATININE 1.41* 1.45*  CALCIUM 8.2* 8.4*   PT/INR No results for input(s): "LABPROT", "INR" in the last 72 hours.  Studies/Results: No results  found.  Anti-infectives: Anti-infectives (From admission, onward)    Start     Dose/Rate Route Frequency Ordered Stop   10/01/22 1800  piperacillin-tazobactam (ZOSYN) IVPB 3.375 g        3.375 g 12.5 mL/hr over 240 Minutes Intravenous Every 8 hours 10/01/22 1531     10/01/22 0815  piperacillin-tazobactam (ZOSYN) IVPB 3.375 g        3.375 g 100 mL/hr over 30 Minutes Intravenous  Once 10/01/22 0812 10/01/22 1022       Assessment/Plan: Bradley Torres is admitted for perforated diverticulitis with abscess. His vital signs are stable and he remains afebrile. His leukocytosis has increased slightly to 11.9. While in the hospital, continue IV Zosyn and PRN Dilaudid, Oxy IR and Percocet for pain control. He is not a candidate for surgery at this time. He will be referred to the drain clinic at discharge and follow-up with Dr. Lovell Sheehan to discuss the option to refer to a tertiary center for surgery. General surgery will continue to follow while he remains in the hospital, but from a surgical perspective he can be discharged.   LOS: 3 days   Camille Bal 10/04/2022

## 2022-10-04 NOTE — Evaluation (Signed)
Physical Therapy Evaluation Patient Details Name: Bradley Torres MRN: [id number] DOB: 08-07-1960 Today's Date: 10/04/2022  History of Present Illness  Bradley Torres is a 62 y.o. male with medical history of  RLE DVT and PE 2019, PAF on xarelto, OSA, OHS, pulmonary HTN, CAD, NIDT2DM, HTN, chronic HFpEF, CVA, and morbid obesity who was admitted March 2024 for perforated diverticulitis and presented to the ED this morning for LLQ abdominal pain and fever similar to prior episodes of diverticulitis flares. He was afebrile with leukocytosis (WBC 18k), lactic acidemia (2.2), SCr near baseline at 1.84, and CT abd/pelvis demonstrating a large left ventral peritoneal abscess potentially communicating with descending colon consistent with abscess without pneumatosis. General surgery was consulted, recommended IR drain placement, and hospitalist admission requested. He will be given antibiotics, transport to [facility] for CT-guided drain placement, and return for admission at [facility].   Clinical Impression  Patient functioning near baseline for functional mobility and gait demonstrating good return for bed mobility, transferring to/from chair and commode in bathroom and walking in room without need for an AD.  Plan:  Patient discharged from physical therapy to care of nursing for ambulation daily as tolerated for length of stay.          Assistance Recommended at Discharge PRN  If plan is discharge home, recommend the following:  Can travel by private vehicle  Other (comment) (patient near baseline)        Equipment Recommendations None recommended by PT  Recommendations for Other Services       Functional Status Assessment Patient has not had a recent decline in their functional status     Precautions / Restrictions Precautions Precautions: None Restrictions Weight Bearing Restrictions: No      Mobility  Bed Mobility Overal bed mobility: Modified Independent                   Transfers Overall transfer level: Modified independent                      Ambulation/Gait Ambulation/Gait assistance: Modified independent (Device/Increase time) Gait Distance (Feet): 15 Feet Assistive device: None Gait Pattern/deviations: WFL(Within Functional Limits) Gait velocity: slightly decreased     General Gait Details: Patient demonstrates good return for walking in room without rquiring use of an AD and no loss of balance  Stairs            Wheelchair Mobility     Tilt Bed    Modified Rankin (Stroke Patients Only)       Balance Overall balance assessment: Needs assistance Sitting-balance support: Feet supported, No upper extremity supported Sitting balance-Leahy Scale: Good Sitting balance - Comments: seated at EOB   Standing balance support: During functional activity, No upper extremity supported Standing balance-Leahy Scale: Fair Standing balance comment: fair/good without AD                             Pertinent Vitals/Pain Pain Assessment Pain Assessment: No/denies pain    Home Living Family/patient expects to be discharged to:: Private residence Living Arrangements: Alone Available Help at Discharge: Friend(s) Type of Home: Mobile home Home Access: Stairs to enter   Entrance Stairs-Number of Steps: 5-6   Home Layout: One level Home Equipment: Agricultural consultant (2 wheels);Cane - quad;BSC/3in1;Shower seat      Prior Function Prior Level of Function : Independent/Modified Independent  Mobility Comments: Reports independence with all ambulation and transfers without device ADLs Comments: independent with ADLs, shower chair for bathing     Hand Dominance   Dominant Hand: Right    Extremity/Trunk Assessment   Upper Extremity Assessment Upper Extremity Assessment: Overall WFL for tasks assessed    Lower Extremity Assessment Lower Extremity Assessment: Overall WFL for tasks assessed     Cervical / Trunk Assessment Cervical / Trunk Assessment: Normal  Communication   Communication: No difficulties  Cognition Arousal/Alertness: Awake/alert Behavior During Therapy: WFL for tasks assessed/performed Overall Cognitive Status: Within Functional Limits for tasks assessed                                          General Comments      Exercises     Assessment/Plan    PT Assessment Patient does not need any further PT services  PT Problem List         PT Treatment Interventions      PT Goals (Current goals can be found in the Care Plan section)  Acute Rehab PT Goals Patient Stated Goal: return  home PT Goal Formulation: With patient Time For Goal Achievement: 10/04/22 Potential to Achieve Goals: Good    Frequency       Co-evaluation               AM-PAC PT "6 Clicks" Mobility  Outcome Measure Help needed turning from your back to your side while in a flat bed without using bedrails?: None Help needed moving from lying on your back to sitting on the side of a flat bed without using bedrails?: None Help needed moving to and from a bed to a chair (including a wheelchair)?: None Help needed standing up from a chair using your arms (e.g., wheelchair or bedside chair)?: None Help needed to walk in hospital room?: None Help needed climbing 3-5 steps with a railing? : None 6 Click Score: 24    End of Session   Activity Tolerance: Patient tolerated treatment well Patient left: Other (comment);with call bell/phone within reach (Patient left sitting on commode in bathroom) Nurse Communication: Mobility status PT Visit Diagnosis: Unsteadiness on feet (R26.81);Other abnormalities of gait and mobility (R26.89);Muscle weakness (generalized) (M62.81)    Time: 1610-9604 PT Time Calculation (min) (ACUTE ONLY): 22 min   Charges:   PT Evaluation $PT Eval Moderate Complexity: 1 Mod PT Treatments $Therapeutic Activity: 8-22 mins PT General  Charges $$ ACUTE PT VISIT: 1 Visit         2:15 PM, 10/04/22 Ocie Bob, MPT Physical Therapist with [facility] 336 [phone] office [phone] mobile phone

## 2022-10-04 NOTE — Progress Notes (Signed)
[facility] PROGRESS NOTE  Bradley Torres (DOB: 01/20/1961) ZOX:[id number] PCP: Mechele Claude, MD Outpatient Specialists: Surgery, Dr. Lovell Sheehan; Pain Management, Dr. Malen Gauze [facility])  Brief Narrative: Bradley Torres is a 62 y.o. male with a history of RLE DVT and PE 2019, PAF on xarelto, OSA, OHS, pulmonary HTN, CAD, NIDT2DM, HTN, chronic HFpEF, CVA, and morbid obesity who was admitted March 2024 for perforated diverticulitis and presented to the ED 10/01/2022 for LLQ abdominal pain and fever, found to have a large left ventral peritoneal abscess potentially communicating with descending colon consistent with abscess from perforated diverticulitis without pneumatosis. General surgery was consulted, IR performed CT-guided drain placement on 7/6 with 140cc thick purulent fluid output sent for gram stain and culture. He has continued to require IV pain medications. Leukocytosis stable on zosyn.   Subjective: Wants to go home but is in evident pain as I enter the room. Has been trying not to take dilaudid since his last dose last night, pain 9/10. He is tolerating diet, no BM yet, +flatus.   Objective: BP 125/70 (BP Location: Left Arm)   Pulse 75   Temp 97.9 F (36.6 C) (Oral)   Resp 20   Ht 6' (1.829 m)   Wt (!) 151 kg   SpO2 94%   BMI 45.16 kg/m   Gen: Older obese male in evident discomfort. Pulm: Clear, nonlabored  CV: RRR, no edema GI: Soft, tender on left side to mild touch, no rebound, JP in L side with c/d/I dressing and dark brown output. Neuro: Alert and oriented. No new focal deficits. Ext: Warm, no deformities. Skin: No other rashes, lesions or ulcers on visualized skin   Assessment & Plan: Diverticulitis with perforation and intraabdominal abscess: s/p drain placement 7/6. - Continue empiric zosyn, WBC down overall, remains afebrile. Abscess culture is polymicrobial.   - Monitor blood cultures from 7/6 (remain NGTD x3d) - Continue monitoring JP output,  declining, exam stable. Flush drain at least qshift. Will refer to IR drain clinic at discharge. - General surgery consulted. Note he is high risk for general anesthesia. Has relationship with Dr. Lovell Sheehan. Will follow up as outpatient, no inpatient surgery recommended.  - Continue baseline percocet and prn IV dilaudid which the patient continues to use. He wanted to discharge but admits he really needs IV pain medication still. He is at very high risk of readmission (already a readmit this time), will stay an additional 24 hours. - Added senokot, miralax   PAF: Currently NSR, had AFib w/RVR in last hospitalization but telemetry thus far has only shown NSR w/occasional PACs.  - Continue amiodarone 200mg  daily, xarelto   CAD: No angina currently.  - Continue risk factor modification and outpatient follow up.    History of PE, OSA, OHS, pulmonary HTN:  - Encouraged CPAP use and weight loss - Continue DOAC.   HTN:  - Chlorthalidone, hydralazine, valsartan on med list, though pt normotensive and last cardiology OV he reported orthostatic symptoms. Will continue holding these medications, will check orthostatic BP. Restart BP meds stepwise most likely.   NIDT2DM: Last HbA1c 8.3%.  - SSI, at inpatient goal - Hold metformin, farxiga and [place] for now    AKI:  - Improved with fluids, continue monitoring, avoid nephrotoxins.  Hypokalemia:  - Supplemented and resolved.   HLD: - Statin   Gout: Chronic, quiescent.  - Reorder allopurinol     Hypogonadism:  - Defer testosterone to outpatient setting.   Chronic pain:  - Reordered oxycodone-acetaminophen  10-325mg  q6h prn home dose (confirmed on PDMP review) - Follow up with Dr. Malen Gauze per routine, rescheduled appt to Friday 7/12. Would require Rx for baseline medications to make it to that appointment.   Morbid obesity: Body mass index is 45.16 kg/m.   [name], MD [facility] [email or web address] 10/04/2022, 8:45 AM

## 2022-10-04 NOTE — Progress Notes (Signed)
Patient has been up most of the night. 2 prn PO  medications given during this shift.  JP drainage output is 10ml for this shift. Plan of care ongoing.

## 2022-10-05 DIAGNOSIS — E1165 Type 2 diabetes mellitus with hyperglycemia: Secondary | ICD-10-CM | POA: Diagnosis not present

## 2022-10-05 DIAGNOSIS — I5032 Chronic diastolic (congestive) heart failure: Secondary | ICD-10-CM

## 2022-10-05 DIAGNOSIS — K651 Peritoneal abscess: Secondary | ICD-10-CM | POA: Diagnosis not present

## 2022-10-05 DIAGNOSIS — I480 Paroxysmal atrial fibrillation: Secondary | ICD-10-CM | POA: Diagnosis not present

## 2022-10-05 DIAGNOSIS — K5720 Diverticulitis of large intestine with perforation and abscess without bleeding: Secondary | ICD-10-CM

## 2022-10-05 LAB — GLUCOSE, CAPILLARY
Glucose-Capillary: 110 mg/dL — ABNORMAL HIGH (ref 70–99)
Glucose-Capillary: 146 mg/dL — ABNORMAL HIGH (ref 70–99)
Glucose-Capillary: 161 mg/dL — ABNORMAL HIGH (ref 70–99)
Glucose-Capillary: 166 mg/dL — ABNORMAL HIGH (ref 70–99)
Glucose-Capillary: 186 mg/dL — ABNORMAL HIGH (ref 70–99)
Glucose-Capillary: 198 mg/dL — ABNORMAL HIGH (ref 70–99)
Glucose-Capillary: 204 mg/dL — ABNORMAL HIGH (ref 70–99)

## 2022-10-05 LAB — CBC
HCT: 36.1 % — ABNORMAL LOW (ref 39.0–52.0)
Hemoglobin: 11.3 g/dL — ABNORMAL LOW (ref 13.0–17.0)
MCH: 31 pg (ref 26.0–34.0)
MCHC: 31.3 g/dL (ref 30.0–36.0)
MCV: 99.2 fL (ref 80.0–100.0)
Platelets: 373 10*3/uL (ref 150–400)
RBC: 3.64 MIL/uL — ABNORMAL LOW (ref 4.22–5.81)
RDW: 15.1 % (ref 11.5–15.5)
WBC: 10.8 10*3/uL — ABNORMAL HIGH (ref 4.0–10.5)
nRBC: 0 % (ref 0.0–0.2)

## 2022-10-05 LAB — BASIC METABOLIC PANEL
Anion gap: 8 (ref 5–15)
BUN: 17 mg/dL (ref 6–20)
CO2: 29 mmol/L (ref 22–32)
Calcium: 8.5 mg/dL — ABNORMAL LOW (ref 8.9–10.3)
Chloride: 96 mmol/L — ABNORMAL LOW (ref 98–111)
Creatinine, Ser: 1.46 mg/dL — ABNORMAL HIGH (ref 0.61–1.24)
GFR, Estimated: 55 mL/min — ABNORMAL LOW (ref >60)
Glucose, Bld: 174 mg/dL — ABNORMAL HIGH (ref 70–99)
Potassium: 4 mmol/L (ref 3.5–5.1)
Sodium: 133 mmol/L — ABNORMAL LOW (ref 135–145)

## 2022-10-05 MED ORDER — POLYETHYLENE GLYCOL 3350 17 G PO PACK
17.0000 g | PACK | Freq: Two times a day (BID) | ORAL | Status: DC
  Administered 2022-10-05 – 2022-10-06 (×2): 17 g via ORAL
  Filled 2022-10-05 (×2): qty 1

## 2022-10-05 NOTE — Progress Notes (Signed)
  Subjective: He reports improved pain from yesterday. His LLQ pain remains constant, dull and rated a 7.5/10. He denied nausea, vomiting and diarrhea. He last received IV Dilaudid 7/9 at 2206 and Oxy IR + Percocet 7/10 at 0257. The pain is tolerable in bed and worse when he moves around. Yesterday, he was able to move from the bed to the recliner and back to the bed. He slept most of the day. He has not had a BM this hospitalization, but continues to pass flatus. He is able to tolerate a regular diet without difficulty. He would like to go home tomorrow so he can make it to his pain management appointment on Friday.   Objective: Vital signs in last 24 hours: Temp:  [97.6 F (36.4 C)-98.4 F (36.9 C)] 97.6 F (36.4 C) (07/10 0351) Pulse Rate:  [73-87] 73 (07/10 0351) Resp:  [20] 20 (07/10 0351) BP: (118-151)/(72-85) 133/77 (07/10 0351) SpO2:  [95 %-96 %] 96 % (07/10 0351) Last BM Date : 09/30/22  Intake/Output from previous day: 07/09 0701 - 07/10 0700 In: 939.2 [P.O.:600; IV Piggyback:339.2] Out: 2715 [Urine:2675; Drains:40] Intake/Output this shift: Total I/O In: -  Out: 1005 [Urine:975; Drains:30]  General appearance: alert and appeared uncomfortable in the bed Head: Normocephalic, without obvious abnormality, atraumatic Resp: clear to auscultation bilaterally and normal work of breathing Cardio: regular rate and rhythm, S1, S2 normal, no murmur, click, rub or gallop GI: soft, non-distended abdomen. Tender to palpation in LLQ and LUQ. No rigidity, rebound or guarding.  Extremities: extremities normal, atraumatic, no cyanosis or edema Drain dressing is clean, dry and intact. Drain had small amount of brown liquid.   Lab Results:  Recent Labs    10/04/22 0404 10/05/22 0403  WBC 11.9* 10.8*  HGB 11.8* 11.3*  HCT 37.5* 36.1*  PLT 370 373   BMET Recent Labs    10/04/22 0404 10/05/22 0403  NA 134* 133*  K 4.1 4.0  CL 97* 96*  CO2 27 29  GLUCOSE 106* 174*  BUN 17 17   CREATININE 1.45* 1.46*  CALCIUM 8.4* 8.5*   PT/INR No results for input(s): "LABPROT", "INR" in the last 72 hours.  Studies/Results: No results found.  Anti-infectives: Anti-infectives (From admission, onward)    Start     Dose/Rate Route Frequency Ordered Stop   10/01/22 1800  piperacillin-tazobactam (ZOSYN) IVPB 3.375 g        3.375 g 12.5 mL/hr over 240 Minutes Intravenous Every 8 hours 10/01/22 1531     10/01/22 0815  piperacillin-tazobactam (ZOSYN) IVPB 3.375 g        3.375 g 100 mL/hr over 30 Minutes Intravenous  Once 10/01/22 0812 10/01/22 1022       Assessment/Plan: Bradley Torres is admitted for perforated diverticulitis with abscess. His vital signs are stable and he remains afebrile. His leukocytosis has decreased to 10.8. While in the hospital, continue pain control with PRN Dilaudid and Oxy IR + Percocet and IV Zosyn for infection. He is not a surgical candidate at this time. At discharge, he will be referred to the drain clinic and will follow-up with Bradley Torres after the abscess has resolved to discuss a possible referral to a tertiary center for surgery as this is a recurring issue. While in the hospital, general surgery will continue to follow him, but from a surgical perspective he can be discharged.   LOS: 4 days   Camille Bal 10/05/2022

## 2022-10-05 NOTE — Progress Notes (Signed)
Pt has been up in the chair half of the day. Pt has also required IV pain medication x 2 and oral pain medication x 1. No BM this shift, but is passing flatus. Plan of care ongoing.

## 2022-10-05 NOTE — Hospital Course (Addendum)
Bradley Torres is a 62 y.o. male with a history of RLE DVT and PE 2019, PAF on xarelto, OSA, OHS, pulmonary HTN, CAD, NIDT2DM, HTN, chronic HFpEF, CVA, and morbid obesity who was admitted March 2024 for perforated diverticulitis and presented to the ED 10/01/2022 for LLQ abdominal pain and fever, found to have a large left ventral peritoneal abscess potentially communicating with descending colon consistent with abscess from perforated diverticulitis without pneumatosis. General surgery was consulted, IR performed CT-guided drain placement on 7/6 with 140cc thick purulent fluid output sent for gram stain and culture. He has continued to require IV pain medications. Leukocytosis improving on zosyn.   Diet has been gradually advanced which pt has tolerated.

## 2022-10-05 NOTE — Progress Notes (Signed)
PROGRESS NOTE  Bradley Torres XBJ:[id number] DOB: 01/22/1961 DOA: 10/01/2022 PCP: Mechele Claude, MD  Brief History:  Bradley Torres is a 62 y.o. male with a history of RLE DVT and PE 2019, PAF on xarelto, OSA, OHS, pulmonary HTN, CAD, NIDT2DM, HTN, chronic HFpEF, CVA, and morbid obesity who was admitted March 2024 for perforated diverticulitis and presented to the ED 10/01/2022 for LLQ abdominal pain and fever, found to have a large left ventral peritoneal abscess potentially communicating with descending colon consistent with abscess from perforated diverticulitis without pneumatosis. General surgery was consulted, IR performed CT-guided drain placement on 7/6 with 140cc thick purulent fluid output sent for gram stain and culture. He has continued to require IV pain medications. Leukocytosis improving on zosyn.   Diet has been gradually advanced which pt has tolerated.   Assessment/Plan: Diverticulitis with perforation and intraabdominal abscess:  -s/p IR drain placement 7/6. - Continue empiric zosyn, WBC down overall, remains afebrile. Abscess culture is polymicrobial.   - Monitor blood cultures from 7/6--neg to date - Continue monitoring JP output, declining, exam stable. Flush drain at least qshift.  -refer to IR drain clinic at discharge. - General surgery consulted. Note he is high risk for general anesthesia.  -per Dr. Jenkins>>continue nonoperative management - Continue baseline percocet and prn IV dilaudid which the patient continues to use. He wanted to discharge but admits he really needs IV pain medication still. He is at very high risk of readmission (already a readmit this time) - Added senokot, miralax -10/05/22--overall pain is gradually improving   PAF: Currently NSR, had AFib w/RVR in last hospitalization but telemetry thus far has only shown NSR w/occasional PACs.  - Continue amiodarone 200mg  daily, xarelto   CAD:  -No angina currently.  - Continue risk factor  modification and outpatient follow up.    History of PE, OSA, OHS, pulmonary HTN:  - Encouraged CPAP use and weight loss - Continue Xarelto   HTN:  - Chlorthalidone, hydralazine, valsartan on med list, though pt normotensive and last cardiology OV he reported orthostatic symptoms. Will continue holding these medications,  - BP remains controlled off of the above meds - outpt follow up with PCP   NIDT2DM: Last  - 06/19/22 HbA1c 8.3%.  - SSI, at inpatient goal - Hold metformin, farxiga and [place] for now     CKD 3b - baseline creatinine 1.4-1.6   Hypokalemia:  - Supplemented and resolved.  Hyponatremia -due to volume depletion and poor solute intake   HLD: - Statin   Gout: Chronic, quiescent.  - Reorder allopurinol     Hypogonadism:  - Defer testosterone to outpatient setting.   Chronic pain:  - Reordered oxycodone-acetaminophen 10-325mg  q6h prn home dose , #120, last refill 09/05/22 - Follow up with Dr. Malen Gauze per routine, rescheduled appt to Friday 7/12. May require Rx for baseline medications to make it to that appointment.   Morbid obesity:  -Body mass index is 45.16 kg/m.  -lifestyle modification          Family Communication:  no Family at bedside  Consultants:  general surgery, IR  Code Status:  FULL  DVT Prophylaxis:  Xarelto   Procedures: As Listed in Progress Note Above  Antibiotics: Zosyn 7/6>>     Subjective: Patient states that abdominal pain is about 50% better.  He denies any nausea, vomiting, diarrhea.  He is passing gas.  No bowel movement.  He denies any fevers, chills,  chest pain, shortness breath  Objective: Vitals:   10/04/22 2206 10/05/22 0257 10/05/22 0351 10/05/22 1302  BP: (!) 140/72 118/73 133/77 133/79  Pulse:   73 79  Resp:   20 18  Temp:   97.6 F (36.4 C) 98 F (36.7 C)  TempSrc:   Oral Oral  SpO2:   96% 95%  Weight:      Height:        Intake/Output Summary (Last 24 hours) at 10/05/2022 1715 Last data  filed at 10/05/2022 1702 Gross per 24 hour  Intake 1080 ml  Output 2715 ml  Net -1635 ml   Weight change:  Exam:  General:  Pt is alert, follows commands appropriately, not in acute distress HEENT: No icterus, No thrush, No neck mass, [place]/AT Cardiovascular: RRR, S1/S2, no rubs, no gallops Respiratory: CTA bilaterally, no wheezing, no crackles, no rhonchi Abdomen: Soft/+BS, LLQ tender, non distended, no guarding Extremities: No edema, No lymphangitis, No petechiae, No rashes, no synovitis   Data Reviewed: I have personally reviewed following labs and imaging studies Basic Metabolic Panel: Recent Labs  Lab 10/01/22 0829 10/02/22 0458 10/03/22 0353 10/04/22 0404 10/05/22 0403  NA 133* 132* 133* 134* 133*  K 3.3* 3.2* 3.1* 4.1 4.0  CL 96* 98 96* 97* 96*  CO2 26 26 28 27 29   GLUCOSE 199* 134* 128* 106* 174*  BUN 21* 22* 19 17 17   CREATININE 1.89* 1.51* 1.41* 1.45* 1.46*  CALCIUM 8.4* 7.9* 8.2* 8.4* 8.5*  MG  --   --   --  2.1  --    Liver Function Tests: Recent Labs  Lab 10/01/22 0829  AST 13*  ALT 10  ALKPHOS 56  BILITOT 1.0  PROT 7.1  ALBUMIN 2.9*   Recent Labs  Lab 10/01/22 0829  LIPASE 24   No results for input(s): "AMMONIA" in the last 168 hours. Coagulation Profile: No results for input(s): "INR", "PROTIME" in the last 168 hours. CBC: Recent Labs  Lab 10/01/22 0829 10/02/22 0458 10/03/22 0353 10/04/22 0404 10/05/22 0403  WBC 18.0* 12.2* 11.0* 11.9* 10.8*  HGB 12.5* 11.3* 11.5* 11.8* 11.3*  HCT 39.4 35.2* 36.5* 37.5* 36.1*  MCV 96.8 97.5 99.2 98.7 99.2  PLT 338 315 334 370 373   Cardiac Enzymes: No results for input(s): "CKTOTAL", "CKMB", "CKMBINDEX", "TROPONINI" in the last 168 hours. BNP: Invalid input(s): "POCBNP" CBG: Recent Labs  Lab 10/05/22 0035 10/05/22 0353 10/05/22 0726 10/05/22 1158 10/05/22 1619  GLUCAP 161* 204* 110* 166* 146*   HbA1C: No results for input(s): "HGBA1C" in the last 72 hours. Urine analysis:    Component  Value Date/Time   COLORURINE YELLOW 10/01/2022 1050   APPEARANCEUR HAZY (A) 10/01/2022 1050   LABSPEC 1.040 (H) 10/01/2022 1050   PHURINE 5.0 10/01/2022 1050   GLUCOSEU >=500 (A) 10/01/2022 1050   HGBUR NEGATIVE 10/01/2022 1050   BILIRUBINUR NEGATIVE 10/01/2022 1050   KETONESUR NEGATIVE 10/01/2022 1050   PROTEINUR 30 (A) 10/01/2022 1050   NITRITE NEGATIVE 10/01/2022 1050   LEUKOCYTESUR NEGATIVE 10/01/2022 1050   Sepsis Labs: @LABRCNTIP (procalcitonin:4,lacticidven:4) ) Recent Results (from the past 240 hour(s))  Culture, blood (routine x 2)     Status: None (Preliminary result)   Collection Time: 10/01/22  8:29 AM   Specimen: Right Antecubital; Blood  Result Value Ref Range Status   Specimen Description   Final    RIGHT ANTECUBITAL BOTTLES DRAWN AEROBIC AND ANAEROBIC   Special Requests Blood Culture adequate volume  Final   Culture   Final  NO GROWTH 4 DAYS Performed at [facility], [address], [place], [place] [address]    Report Status PENDING  Incomplete  Culture, blood (routine x 2)     Status: None (Preliminary result)   Collection Time: 10/01/22  8:29 AM   Specimen: Left Antecubital; Blood  Result Value Ref Range Status   Specimen Description   Final    LEFT ANTECUBITAL BOTTLES DRAWN AEROBIC AND ANAEROBIC   Special Requests Blood Culture adequate volume  Final   Culture   Final    NO GROWTH 4 DAYS Performed at [facility], [address] [place], [place] [address]    Report Status PENDING  Incomplete  Aerobic/Anaerobic Culture w Gram Stain (surgical/deep wound)     Status: Abnormal   Collection Time: 10/01/22  3:17 PM   Specimen: Abscess  Result Value Ref Range Status   Specimen Description ABSCESS  Final   Special Requests ABDOMEN  Final   Gram Stain   Final    RARE WBC PRESENT, PREDOMINANTLY PMN ABUNDANT GRAM POSITIVE COCCI ABUNDANT GRAM NEGATIVE RODS FEW GRAM POSITIVE RODS Performed at [facility], [address], [place], [place] [address]     Culture (A)  Final    MULTIPLE ORGANISMS PRESENT, NONE PREDOMINANT MIXED ANAEROBIC FLORA PRESENT.  CALL LAB IF FURTHER IID REQUIRED.    Report Status 10/03/2022 FINAL  Final     Scheduled Meds:  allopurinol  100 mg Oral Daily   amiodarone  200 mg Oral Daily   atorvastatin  20 mg Oral Daily   insulin aspart  0-15 Units Subcutaneous Q4H   polyethylene glycol  17 g Oral BID   rivaroxaban  20 mg Oral Q supper   senna-docusate  2 tablet Oral Daily   sodium chloride flush  5 mL Intracatheter Q8H   Continuous Infusions:  piperacillin-tazobactam (ZOSYN)  IV 3.375 g (10/05/22 1412)    Procedures/Studies: CT GUIDED PERITONEAL/RETROPERITONEAL FLUID DRAIN BY PERC CATH  Result Date: 10/01/2022 INDICATION: 62 year old male with perforated diverticulitis and associated diverticular abscess. He presents for drain placement EXAM: CT-guided drain placement MEDICATIONS: The patient is currently admitted to the hospital and receiving intravenous antibiotics. The antibiotics were administered within an appropriate time frame prior to the initiation of the procedure. ANESTHESIA/SEDATION: Moderate (conscious) sedation was employed during this procedure. A total of Versed 2 mg and Fentanyl 100 mcg was administered intravenously by the radiology nurse. Total intra-service moderate Sedation Time: 13 minutes. The patient's level of consciousness and vital signs were monitored continuously by radiology nursing throughout the procedure under my direct supervision. COMPLICATIONS: None immediate. PROCEDURE: Informed written consent was obtained from the patient after a thorough discussion of the procedural risks, benefits and alternatives. All questions were addressed. Maximal Sterile Barrier Technique was utilized including caps, mask, sterile gowns, sterile gloves, sterile drape, hand hygiene and skin antiseptic. A timeout was performed prior to the initiation of the procedure. A planning axial CT scan was performed.  The abscess collection in the left lower quadrant was successfully identified. A suitable skin entry site was selected and marked. Local anesthesia was attained by infiltration with 1% lidocaine. A small dermatotomy was made. Under intermittent CT guidance, an 18 gauge trocar needle was advanced into the fluid collection. A 0.035 wire was then coiled in the collection. The skin tract was dilated to 12 [place]. A 12 French all-purpose drainage catheter was advanced over the wire and formed. Approximately 140 mL of thick purulent fluid was then successfully aspirated. Aspirated  fluid was sent for Gram stain and culture. The drain was lavaged and connected to JP bulb suction. The drainage catheter was then secured to the skin with 0 Prolene suture. Sterile bandages were placed. Post placement CT imaging demonstrates a well-positioned drainage catheter with no evidence of complication. The patient tolerated the procedure well. IMPRESSION: Successful placement of a 12 French drainage catheter into the left lower quadrant diverticular abscess with removal of 140 mL thick purulent fluid. A sample was sent for Gram stain and culture. Recommend keeping drain to JP bulb suction and flushing at least once per shift. Electronically Signed   By: Malachy Moan M.D.   On: 10/01/2022 15:34   CT ABDOMEN PELVIS W CONTRAST  Result Date: 10/01/2022 CLINICAL DATA:  Abdominal pain for 2 days. LEFT lower quadrant pain. History of perforated diverticulitis in March 2024. EXAM: CT ABDOMEN AND PELVIS WITH CONTRAST TECHNIQUE: Multidetector CT imaging of the abdomen and pelvis was performed using the standard protocol following bolus administration of intravenous contrast. RADIATION DOSE REDUCTION: This exam was performed according to the departmental dose-optimization program which includes automated exposure control, adjustment of the mA and/or kV according to patient size and/or use of iterative reconstruction technique. CONTRAST:   80mL OMNIPAQUE IOHEXOL 300 MG/ML  SOLN COMPARISON:  CT 06/22/2022 FINDINGS: Lower chest: Lung bases are clear. Hepatobiliary: No focal hepatic lesion. Several small gallstones. No gallbladder inflammation. No biliary duct dilatation. Common bile duct is normal. Pancreas: Pancreas is normal. No ductal dilatation. No pancreatic inflammation. Spleen: Normal spleen Adrenals/urinary tract: Adrenal glands and kidneys are normal. The ureters and bladder normal. Stomach/Bowel: Stomach, duodenum are normal. There is a contained gas in fluid collection in the LEFT ventral peritoneal space measuring 9.3 x 5.8 by 10.1 cm (image 61/series 2) most consistent with a peritoneal abscess. This fluid collection is immediately adjacent to the descending colon along the mesenteric border. Potential communication with the colon on image 58/2. The abscess is also immediately adjacent loops of small bowel in the LEFT upper quadrant and pushes these loops of small bowel medially. There is no intraperitoneal free air. No pneumatosis or portal venous gas. The distal small bowel is normal. The terminal ileum is normal. Appendix is normal. Ascending and transverse colon normal. There is inflammation along the proximal descending colon as described above. Diverticulosis of the sigmoid colon. Rectum normal. Vascular/Lymphatic: Abdominal aorta is normal caliber with atherosclerotic calcification. There is no retroperitoneal or periportal lymphadenopathy. No pelvic lymphadenopathy. Reproductive: Prostate unremarkable Other: No free fluid. Musculoskeletal: No aggressive osseous lesion. IMPRESSION: 1. Large peritoneal abscess in the LEFT ventral peritoneal space. Potential communication with the descending colon. Findings most consistent with perforated diverticulitis of the descending colon with abscess formation. No intraperitoneal free air. No pneumatosis or portal venous gas. 2. Cholelithiasis without evidence of cholecystitis. 3. Aortic  atherosclerosis. Aortic Atherosclerosis (ICD10-I70.0). Electronically Signed   By: Genevive Bi M.D.   On: 10/01/2022 09:54    Catarina Hartshorn, DO  [facility]  If 7PM-7AM, please contact night-coverage [email or web address] Password [email or web address] 10/05/2022, 5:15 PM   LOS: 4 days

## 2022-10-05 NOTE — Progress Notes (Signed)
Patient has been up most of the night. PRN PO and IV pain medication given. JP drain output for this shift 30mL. Brown, thick and has an odor. Dressing changed. Good urine output. No BM this shift, passing flatus. Patient stated he feels constipated. Patient has not gotten up this shift, remains in bed and using urinal.

## 2022-10-06 DIAGNOSIS — I5032 Chronic diastolic (congestive) heart failure: Secondary | ICD-10-CM | POA: Diagnosis not present

## 2022-10-06 DIAGNOSIS — E1165 Type 2 diabetes mellitus with hyperglycemia: Secondary | ICD-10-CM | POA: Diagnosis not present

## 2022-10-06 DIAGNOSIS — K5720 Diverticulitis of large intestine with perforation and abscess without bleeding: Secondary | ICD-10-CM | POA: Diagnosis not present

## 2022-10-06 DIAGNOSIS — I480 Paroxysmal atrial fibrillation: Secondary | ICD-10-CM | POA: Diagnosis not present

## 2022-10-06 DIAGNOSIS — K651 Peritoneal abscess: Secondary | ICD-10-CM | POA: Diagnosis not present

## 2022-10-06 LAB — CBC
HCT: 38.6 % — ABNORMAL LOW (ref 39.0–52.0)
Hemoglobin: 12.1 g/dL — ABNORMAL LOW (ref 13.0–17.0)
MCH: 31.3 pg (ref 26.0–34.0)
MCHC: 31.3 g/dL (ref 30.0–36.0)
MCV: 100 fL (ref 80.0–100.0)
Platelets: 408 10*3/uL — ABNORMAL HIGH (ref 150–400)
RBC: 3.86 MIL/uL — ABNORMAL LOW (ref 4.22–5.81)
RDW: 14.9 % (ref 11.5–15.5)
WBC: 10.2 10*3/uL (ref 4.0–10.5)
nRBC: 0 % (ref 0.0–0.2)

## 2022-10-06 LAB — BASIC METABOLIC PANEL
Anion gap: 8 (ref 5–15)
BUN: 17 mg/dL (ref 6–20)
CO2: 29 mmol/L (ref 22–32)
Calcium: 8.7 mg/dL — ABNORMAL LOW (ref 8.9–10.3)
Chloride: 96 mmol/L — ABNORMAL LOW (ref 98–111)
Creatinine, Ser: 1.45 mg/dL — ABNORMAL HIGH (ref 0.61–1.24)
GFR, Estimated: 55 mL/min — ABNORMAL LOW (ref >60)
Glucose, Bld: 103 mg/dL — ABNORMAL HIGH (ref 70–99)
Potassium: 4.2 mmol/L (ref 3.5–5.1)
Sodium: 133 mmol/L — ABNORMAL LOW (ref 135–145)

## 2022-10-06 LAB — GLUCOSE, CAPILLARY
Glucose-Capillary: 97 mg/dL (ref 70–99)
Glucose-Capillary: 120 mg/dL — ABNORMAL HIGH (ref 70–99)
Glucose-Capillary: 168 mg/dL — ABNORMAL HIGH (ref 70–99)

## 2022-10-06 MED ORDER — BISACODYL 10 MG RE SUPP
10.0000 mg | Freq: Once | RECTAL | Status: DC
  Filled 2022-10-06: qty 1

## 2022-10-06 MED ORDER — AMOXICILLIN-POT CLAVULANATE 875-125 MG PO TABS: 1.0000 | ORAL_TABLET | Freq: Two times a day (BID) | ORAL | 0 refills | Status: DC

## 2022-10-06 MED ORDER — AMOXICILLIN-POT CLAVULANATE 875-125 MG PO TABS
1.0000 | ORAL_TABLET | Freq: Two times a day (BID) | ORAL | Status: DC
  Administered 2022-10-06: 1 via ORAL
  Filled 2022-10-06: qty 1

## 2022-10-06 MED ORDER — MAGNESIUM HYDROXIDE 400 MG/5ML PO SUSP
30.0000 mL | Freq: Every day | ORAL | Status: DC
  Filled 2022-10-06: qty 30

## 2022-10-06 NOTE — Discharge Summary (Signed)
Physician Discharge Summary   Patient: Bradley Torres MRN: [id number] DOB: 02/20/1961  Admit date:     10/01/2022  Discharge date: 10/06/22  Discharge Physician: Onalee Hua Olivya Sobol   PCP: Mechele Claude, MD   Recommendations at discharge:   Please follow up with primary care provider within 1-2 weeks  Please repeat BMP and CBC in one week      Hospital Course: Bradley Torres is a 62 y.o. male with a history of RLE DVT and PE 2019, PAF on xarelto, OSA, OHS, pulmonary HTN, CAD, NIDT2DM, HTN, chronic HFpEF, CVA, and morbid obesity who was admitted March 2024 for perforated diverticulitis and presented to the ED 10/01/2022 for LLQ abdominal pain and fever, found to have a large left ventral peritoneal abscess potentially communicating with descending colon consistent with abscess from perforated diverticulitis without pneumatosis. General surgery was consulted, IR performed CT-guided drain placement on 7/6 with 140cc thick purulent fluid output sent for gram stain and culture. He has continued to require IV pain medications. Leukocytosis improving on zosyn.   Diet has been gradually advanced which pt has tolerated.  Assessment and Plan: Diverticulitis with perforation and intraabdominal abscess:  -s/p IR drain placement 7/6. - Continue empiric zosyn, WBC down overall, remains afebrile. Abscess culture is polymicrobial.   - Monitor blood cultures from 7/6--neg to date - Continue monitoring JP output, declining, exam stable. Flush drain at least qshift.  -refer to IR drain clinic at discharge. - General surgery consulted. Note he is high risk for general anesthesia.  -per Dr. Jenkins>>continue nonoperative management - Continue baseline percocet and prn IV dilaudid which the patient continues to use. He wanted to discharge but admits he really needs IV pain medication still. He is at very high risk of readmission (already a readmit this time) - Added senokot, miralax -10/05/22--overall pain is gradually  improving and pt tolerating diet -repeat vitals at 10 AM 97.3--HR 82-RR16- 144/84--94 % RA -d/c home with amox/clav x 14 more days   PAF: Currently NSR, had AFib w/RVR in last hospitalization but telemetry thus far has only shown NSR w/occasional PACs.  - Continue amiodarone 200mg  daily, xarelto   CAD:  -No angina currently.  - Continue risk factor modification and outpatient follow up.    History of PE, OSA, OHS, pulmonary HTN:  - Encouraged CPAP use and weight loss - Continue Xarelto   HTN:  - Chlorthalidone, hydralazine, valsartan on med list, though pt normotensive and last cardiology OV he reported orthostatic symptoms. Will continue holding these medications,  - BP remains controlled off of the above meds - outpt follow up with PCP - restart valsartan   NIDT2DM: - 06/19/22 HbA1c 8.3%.  - SSI, at inpatient goal - Hold metformin, farxiga and [place] for now     CKD 3b - baseline creatinine 1.4-1.6 - serum creatinine 1.45 on day of dc   Hypokalemia:  - Supplemented and resolved.   Hyponatremia -due to volume depletion and poor solute intake   HLD: - Statin   Gout: Chronic, quiescent.  - Reorder allopurinol     Hypogonadism:  - Defer testosterone to outpatient setting.   Chronic pain:  - Reordered oxycodone-acetaminophen 10-325mg  q6h prn home dose , #120, last refill 09/05/22 - Follow up with Dr. Malen Gauze per routine, rescheduled appt to Friday 7/12. May require Rx for baseline medications to make it to that appointment.   Morbid obesity:  -Body mass index is 45.16 kg/m.  -lifestyle modification      Consultants: IR,  general surgery Procedures performed: JP drain 7/6  Disposition: Home Diet recommendation:  Carb modified DISCHARGE MEDICATION: Allergies as of 10/06/2022       Reactions   Actos [pioglitazone] Other (See Comments), Cough   Dyspnea  Edema   Zestril [lisinopril] Other (See Comments), Cough   Wheezing    Aldactone [spironolactone] Hives    Bystolic [nebivolol Hcl] Swelling   Motrin [ibuprofen] Other (See Comments)   Rectal bleed   Trulicity [dulaglutide] Other (See Comments), Cough   Shortness of breath GI intolerance   Cozaar [losartan] Rash   Diflucan [fluconazole] Rash   Hctz [hydrochlorothiazide] Other (See Comments)   Gout flares.   Norvasc [amlodipine] Itching, Other (See Comments)   Myalgias         Medication List     TAKE these medications    Accu-Chek Guide test strip Generic drug: glucose blood Use as instructed to monitor glucose twice daily   Accu-Chek Softclix Lancets lancets Use as instructed to monitor glucose twice daily   allopurinol 100 MG tablet Commonly known as: ZYLOPRIM Take 1 tablet (100 mg total) by mouth daily.   amiodarone 200 MG tablet Commonly known as: PACERONE Take 200 mg by mouth daily.   amoxicillin-clavulanate 875-125 MG tablet Commonly known as: AUGMENTIN Take 1 tablet by mouth every 12 (twelve) hours.   atorvastatin 20 MG tablet Commonly known as: LIPITOR TAKE ONE TABLET BY MOUTH ONCE DAILY   chlorthalidone 25 MG tablet Commonly known as: HYGROTON Take 25 mg by mouth every other day.   dapagliflozin propanediol 10 MG Tabs tablet Commonly known as: Farxiga TAKE ONE TABLET BY MOUTH DAILY BEFORE BREAKFAST   DHEA PO Take 1 capsule by mouth daily.   docusate sodium 100 MG capsule Commonly known as: COLACE Take 1 capsule (100 mg total) by mouth 2 (two) times daily. What changed:  when to take this reasons to take this   hydrALAZINE 50 MG tablet Commonly known as: APRESOLINE Take 50 mg by mouth daily.   hydrALAZINE 25 MG tablet Commonly known as: APRESOLINE Take 1 tablet (25 mg total) by mouth daily.   metFORMIN 750 MG 24 hr tablet Commonly known as: GLUCOPHAGE-XR Take 2 tablets (1,500 mg total) by mouth daily with breakfast. What changed: how much to take   oxyCODONE-acetaminophen 10-325 MG tablet Commonly known as: PERCOCET Take 1 tablet by  mouth 3 (three) times daily as needed for pain. What changed: when to take this   polyethylene glycol 17 g packet Commonly known as: MIRALAX / GLYCOLAX Take 17 g by mouth daily. What changed:  when to take this reasons to take this   rivaroxaban 20 MG Tabs tablet Commonly known as: Xarelto TAKE ONE TABLET BY MOUTH DAILY WITH SUPPER What changed:  how much to take how to take this when to take this additional instructions   senna-docusate 8.6-50 MG tablet Commonly known as: Senokot-S Take 2 tablets by mouth at bedtime. What changed:  when to take this reasons to take this   sitaGLIPtin 100 MG tablet Commonly known as: Januvia Take 1 tablet (100 mg total) by mouth daily.   testosterone cypionate 200 MG/ML injection Commonly known as: DEPOTESTOSTERONE CYPIONATE Inject 200 mg into the muscle every 14 (fourteen) days.   valsartan 160 MG tablet Commonly known as: DIOVAN Take 160 mg by mouth daily.   VITAMIN D-3 PO Take 1 tablet by mouth daily.        Follow-up Information     Franky Macho, MD. Call.  Specialty: General Surgery Why: See me after drain clinic follow up. Contact information: [address] [address] [phone]                Discharge Exam: Filed Weights   10/01/22 0853  Weight: (!) 151 kg   HEENT:  [place]/AT, No thrush, no icterus CV:  RRR, no rub, no S3, no S4 Lung:  CTA, no wheeze, no rhonchi Abd:  soft/+BS, LLQ tender Ext:  No edema, no lymphangitis, no synovitis, no rash   Condition at discharge: stable  The results of significant diagnostics from this hospitalization (including imaging, microbiology, ancillary and laboratory) are listed below for reference.   Imaging Studies: CT GUIDED PERITONEAL/RETROPERITONEAL FLUID DRAIN BY PERC CATH  Result Date: 10/01/2022 INDICATION: 62 year old male with perforated diverticulitis and associated diverticular abscess. He presents for drain placement EXAM: CT-guided  drain placement MEDICATIONS: The patient is currently admitted to the hospital and receiving intravenous antibiotics. The antibiotics were administered within an appropriate time frame prior to the initiation of the procedure. ANESTHESIA/SEDATION: Moderate (conscious) sedation was employed during this procedure. A total of Versed 2 mg and Fentanyl 100 mcg was administered intravenously by the radiology nurse. Total intra-service moderate Sedation Time: 13 minutes. The patient's level of consciousness and vital signs were monitored continuously by radiology nursing throughout the procedure under my direct supervision. COMPLICATIONS: None immediate. PROCEDURE: Informed written consent was obtained from the patient after a thorough discussion of the procedural risks, benefits and alternatives. All questions were addressed. Maximal Sterile Barrier Technique was utilized including caps, mask, sterile gowns, sterile gloves, sterile drape, hand hygiene and skin antiseptic. A timeout was performed prior to the initiation of the procedure. A planning axial CT scan was performed. The abscess collection in the left lower quadrant was successfully identified. A suitable skin entry site was selected and marked. Local anesthesia was attained by infiltration with 1% lidocaine. A small dermatotomy was made. Under intermittent CT guidance, an 18 gauge trocar needle was advanced into the fluid collection. A 0.035 wire was then coiled in the collection. The skin tract was dilated to 12 [place]. A 12 French all-purpose drainage catheter was advanced over the wire and formed. Approximately 140 mL of thick purulent fluid was then successfully aspirated. Aspirated fluid was sent for Gram stain and culture. The drain was lavaged and connected to JP bulb suction. The drainage catheter was then secured to the skin with 0 Prolene suture. Sterile bandages were placed. Post placement CT imaging demonstrates a well-positioned drainage catheter  with no evidence of complication. The patient tolerated the procedure well. IMPRESSION: Successful placement of a 12 French drainage catheter into the left lower quadrant diverticular abscess with removal of 140 mL thick purulent fluid. A sample was sent for Gram stain and culture. Recommend keeping drain to JP bulb suction and flushing at least once per shift. Electronically Signed   By: Malachy Moan M.D.   On: 10/01/2022 15:34   CT ABDOMEN PELVIS W CONTRAST  Result Date: 10/01/2022 CLINICAL DATA:  Abdominal pain for 2 days. LEFT lower quadrant pain. History of perforated diverticulitis in March 2024. EXAM: CT ABDOMEN AND PELVIS WITH CONTRAST TECHNIQUE: Multidetector CT imaging of the abdomen and pelvis was performed using the standard protocol following bolus administration of intravenous contrast. RADIATION DOSE REDUCTION: This exam was performed according to the departmental dose-optimization program which includes automated exposure control, adjustment of the mA and/or kV according to patient size and/or use of iterative reconstruction technique. CONTRAST:  80mL OMNIPAQUE IOHEXOL 300 MG/ML  SOLN COMPARISON:  CT 06/22/2022 FINDINGS: Lower chest: Lung bases are clear. Hepatobiliary: No focal hepatic lesion. Several small gallstones. No gallbladder inflammation. No biliary duct dilatation. Common bile duct is normal. Pancreas: Pancreas is normal. No ductal dilatation. No pancreatic inflammation. Spleen: Normal spleen Adrenals/urinary tract: Adrenal glands and kidneys are normal. The ureters and bladder normal. Stomach/Bowel: Stomach, duodenum are normal. There is a contained gas in fluid collection in the LEFT ventral peritoneal space measuring 9.3 x 5.8 by 10.1 cm (image 61/series 2) most consistent with a peritoneal abscess. This fluid collection is immediately adjacent to the descending colon along the mesenteric border. Potential communication with the colon on image 58/2. The abscess is also  immediately adjacent loops of small bowel in the LEFT upper quadrant and pushes these loops of small bowel medially. There is no intraperitoneal free air. No pneumatosis or portal venous gas. The distal small bowel is normal. The terminal ileum is normal. Appendix is normal. Ascending and transverse colon normal. There is inflammation along the proximal descending colon as described above. Diverticulosis of the sigmoid colon. Rectum normal. Vascular/Lymphatic: Abdominal aorta is normal caliber with atherosclerotic calcification. There is no retroperitoneal or periportal lymphadenopathy. No pelvic lymphadenopathy. Reproductive: Prostate unremarkable Other: No free fluid. Musculoskeletal: No aggressive osseous lesion. IMPRESSION: 1. Large peritoneal abscess in the LEFT ventral peritoneal space. Potential communication with the descending colon. Findings most consistent with perforated diverticulitis of the descending colon with abscess formation. No intraperitoneal free air. No pneumatosis or portal venous gas. 2. Cholelithiasis without evidence of cholecystitis. 3. Aortic atherosclerosis. Aortic Atherosclerosis (ICD10-I70.0). Electronically Signed   By: Genevive Bi M.D.   On: 10/01/2022 09:54    Microbiology: Results for orders placed or performed during the hospital encounter of 10/01/22  Culture, blood (routine x 2)     Status: None   Collection Time: 10/01/22  8:29 AM   Specimen: Right Antecubital; Blood  Result Value Ref Range Status   Specimen Description   Final    RIGHT ANTECUBITAL BOTTLES DRAWN AEROBIC AND ANAEROBIC   Special Requests Blood Culture adequate volume  Final   Culture   Final    NO GROWTH 5 DAYS Performed at [facility], [address] [place], [place] [address]    Report Status 10/06/2022 FINAL  Final  Culture, blood (routine x 2)     Status: None   Collection Time: 10/01/22  8:29 AM   Specimen: Left Antecubital; Blood  Result Value Ref Range Status   Specimen  Description   Final    LEFT ANTECUBITAL BOTTLES DRAWN AEROBIC AND ANAEROBIC   Special Requests Blood Culture adequate volume  Final   Culture   Final    NO GROWTH 5 DAYS Performed at [facility], [address], [place], [place] [address]    Report Status 10/06/2022 FINAL  Final  Aerobic/Anaerobic Culture w Gram Stain (surgical/deep wound)     Status: Abnormal   Collection Time: 10/01/22  3:17 PM   Specimen: Abscess  Result Value Ref Range Status   Specimen Description ABSCESS  Final   Special Requests ABDOMEN  Final   Gram Stain   Final    RARE WBC PRESENT, PREDOMINANTLY PMN ABUNDANT GRAM POSITIVE COCCI ABUNDANT GRAM NEGATIVE RODS FEW GRAM POSITIVE RODS Performed at [facility], [address] [place], [place] [address]    Culture (A)  Final    MULTIPLE ORGANISMS PRESENT, NONE PREDOMINANT MIXED ANAEROBIC FLORA PRESENT.  CALL LAB  IF FURTHER IID REQUIRED.    Report Status 10/03/2022 FINAL  Final    Labs: CBC: Recent Labs  Lab 10/02/22 0458 10/03/22 0353 10/04/22 0404 10/05/22 0403 10/06/22 0419  WBC 12.2* 11.0* 11.9* 10.8* 10.2  HGB 11.3* 11.5* 11.8* 11.3* 12.1*  HCT 35.2* 36.5* 37.5* 36.1* 38.6*  MCV 97.5 99.2 98.7 99.2 100.0  PLT 315 334 370 373 408*   Basic Metabolic Panel: Recent Labs  Lab 10/02/22 0458 10/03/22 0353 10/04/22 0404 10/05/22 0403 10/06/22 0419  NA 132* 133* 134* 133* 133*  K 3.2* 3.1* 4.1 4.0 4.2  CL 98 96* 97* 96* 96*  CO2 26 28 27 29 29   GLUCOSE 134* 128* 106* 174* 103*  BUN 22* 19 17 17 17   CREATININE 1.51* 1.41* 1.45* 1.46* 1.45*  CALCIUM 7.9* 8.2* 8.4* 8.5* 8.7*  MG  --   --  2.1  --   --    Liver Function Tests: Recent Labs  Lab 10/01/22 0829  AST 13*  ALT 10  ALKPHOS 56  BILITOT 1.0  PROT 7.1  ALBUMIN 2.9*   CBG: Recent Labs  Lab 10/05/22 1619 10/05/22 2036 10/05/22 2343 10/06/22 0359 10/06/22 0710  GLUCAP 146* 198* 186* 97 120*    Discharge time spent: greater than 30 minutes.  Signed: Catarina Hartshorn,  MD [facility] 10/06/2022

## 2022-10-06 NOTE — Progress Notes (Signed)
  Subjective: He reports pain is better however when asked to rate the pain he rates it a 7-8/10. His pain remains constant and in the LLQ. The pain is okay when resting and laying still, but increases with movement. He last received IV Dilaudid 7/10 at 2342 and oral Oxy IR + Percocet 7/11 at 0355. He reported that the pain is not more controlled by the IV medication than the oral medication. He denied nausea, vomiting and diarrhea. He has not had a BM since Friday, but is passing flatus. He has received Torres Miralax and Senokot since 7/08 He ambulated to the recliner yesterday. He is able to tolerate a regular diet without issues.   Objective: Vital signs in last 24 hours: Temp:  [98 F (36.7 C)-98.4 F (36.9 C)] 98.1 F (36.7 C) (07/11 0515) Pulse Rate:  [66-79] 66 (07/11 0515) Resp:  [18-20] 18 (07/11 0515) BP: (133-157)/(69-85) 139/69 (07/11 0515) SpO2:  [94 %-98 %] 94 % (07/11 0515) Last BM Date : 09/30/22  Intake/Output from previous day: 07/10 0701 - 07/11 0700 In: 840 [P.O.:840] Out: 1930 [Urine:1920; Drains:10] Intake/Output this shift: Total I/O In: 240 [P.O.:240] Out: 830 [Urine:820; Drains:10]  General appearance: alert and appears uncomfortable in bed with hand over LLQ Head: Normocephalic, without obvious abnormality, atraumatic Resp: clear to auscultation bilaterally and normal work of breathing  Cardio: regular rate and rhythm, S1, S2 normal, no murmur, click, rub or gallop GI: Soft, non distended abdomen. Tender to palpation in the LLQ, did not voluntarily move hand for exam. No rigidity or rebound.  Drain dressing is clear, dry and intact. Drain had small amount of light brown liquid.   Lab Results:  Recent Labs    10/05/22 0403 10/06/22 0419  WBC 10.8* 10.2  HGB 11.3* 12.1*  HCT 36.1* 38.6*  PLT 373 408*   BMET Recent Labs    10/05/22 0403 10/06/22 0419  NA 133* 133*  K 4.0 4.2  CL 96* 96*  CO2 29 29  GLUCOSE 174* 103*  BUN 17 17  CREATININE  1.46* 1.45*  CALCIUM 8.5* 8.7*   PT/INR No results for input(s): "LABPROT", "INR" in the last 72 hours.  Studies/Results: No results found.  Anti-infectives: Anti-infectives (From admission, onward)    Start     Dose/Rate Route Frequency Ordered Stop   10/01/22 1800  piperacillin-tazobactam (ZOSYN) IVPB 3.375 g        3.375 g 12.5 mL/hr over 240 Minutes Intravenous Every 8 hours 10/01/22 1531     10/01/22 0815  piperacillin-tazobactam (ZOSYN) IVPB 3.375 g        3.375 g 100 mL/hr over 30 Minutes Intravenous  Once 10/01/22 0812 10/01/22 1022       Assessment/Plan: Bradley Torres is admitted for perforated diverticulitis with abscess. His vital signs are stable and he remains afebrile. His leukocytosis is resolved. While in hospital, continue IV Zosyn and pain control with IV Dilaudid, Oxy IR and Percocet. He is not a surgical candidate at this time and will be treated non-operatively. At discharge, he will be referred to the drain clinic and will follow-up with Dr. Lovell Sheehan to discuss a possible referral to a tertiary center for surgery. General surgery will continue to follow for duration of hospital stay. Currently, from a surgical perspective he can be discharged.   LOS: 5 days    Bradley Torres 10/06/2022

## 2022-10-06 NOTE — Progress Notes (Signed)
Ng Discharge Note  Admit Date:  10/01/2022 Discharge date: 10/06/2022   Bradley Torres to be D/C'd Home per MD order.  AVS completed. Patient/caregiver able to verbalize understanding.  Discharge Medication: Allergies as of 10/06/2022       Reactions   Actos [pioglitazone] Other (See Comments), Cough   Dyspnea  Edema   Zestril [lisinopril] Other (See Comments), Cough   Wheezing    Aldactone [spironolactone] Hives   Bystolic [nebivolol Hcl] Swelling   Motrin [ibuprofen] Other (See Comments)   Rectal bleed   Trulicity [dulaglutide] Other (See Comments), Cough   Shortness of breath GI intolerance   Cozaar [losartan] Rash   Diflucan [fluconazole] Rash   Hctz [hydrochlorothiazide] Other (See Comments)   Gout flares.   Norvasc [amlodipine] Itching, Other (See Comments)   Myalgias         Medication List     TAKE these medications    Accu-Chek Guide test strip Generic drug: glucose blood Use as instructed to monitor glucose twice daily   Accu-Chek Softclix Lancets lancets Use as instructed to monitor glucose twice daily   allopurinol 100 MG tablet Commonly known as: ZYLOPRIM Take 1 tablet (100 mg total) by mouth daily.   amiodarone 200 MG tablet Commonly known as: PACERONE Take 200 mg by mouth daily.   amoxicillin-clavulanate 875-125 MG tablet Commonly known as: AUGMENTIN Take 1 tablet by mouth every 12 (twelve) hours.   atorvastatin 20 MG tablet Commonly known as: LIPITOR TAKE ONE TABLET BY MOUTH ONCE DAILY   chlorthalidone 25 MG tablet Commonly known as: HYGROTON Take 25 mg by mouth every other day.   dapagliflozin propanediol 10 MG Tabs tablet Commonly known as: Farxiga TAKE ONE TABLET BY MOUTH DAILY BEFORE BREAKFAST   DHEA PO Take 1 capsule by mouth daily.   docusate sodium 100 MG capsule Commonly known as: COLACE Take 1 capsule (100 mg total) by mouth 2 (two) times daily. What changed:  when to take this reasons to take this   hydrALAZINE 50  MG tablet Commonly known as: APRESOLINE Take 50 mg by mouth daily.   hydrALAZINE 25 MG tablet Commonly known as: APRESOLINE Take 1 tablet (25 mg total) by mouth daily.   metFORMIN 750 MG 24 hr tablet Commonly known as: GLUCOPHAGE-XR Take 2 tablets (1,500 mg total) by mouth daily with breakfast. What changed: how much to take   oxyCODONE-acetaminophen 10-325 MG tablet Commonly known as: PERCOCET Take 1 tablet by mouth 3 (three) times daily as needed for pain. What changed: when to take this   polyethylene glycol 17 g packet Commonly known as: MIRALAX / GLYCOLAX Take 17 g by mouth daily. What changed:  when to take this reasons to take this   rivaroxaban 20 MG Tabs tablet Commonly known as: Xarelto TAKE ONE TABLET BY MOUTH DAILY WITH SUPPER What changed:  how much to take how to take this when to take this additional instructions   senna-docusate 8.6-50 MG tablet Commonly known as: Senokot-S Take 2 tablets by mouth at bedtime. What changed:  when to take this reasons to take this   sitaGLIPtin 100 MG tablet Commonly known as: Januvia Take 1 tablet (100 mg total) by mouth daily.   testosterone cypionate 200 MG/ML injection Commonly known as: DEPOTESTOSTERONE CYPIONATE Inject 200 mg into the muscle every 14 (fourteen) days.   valsartan 160 MG tablet Commonly known as: DIOVAN Take 160 mg by mouth daily.   VITAMIN D-3 PO Take 1 tablet by mouth daily.  Discharge Assessment: Vitals:   10/05/22 2038 10/06/22 0515  BP: (!) 157/85 139/69  Pulse: 73 66  Resp: 20 18  Temp: 98.4 F (36.9 C) 98.1 F (36.7 C)  SpO2: 98% 94%   Skin clean, dry and intact without evidence of skin break down, no evidence of skin tears noted. IV catheter discontinued intact. Site without signs and symptoms of complications - no redness or edema noted at insertion site, patient denies c/o pain - only slight tenderness at site.  Dressing with slight pressure applied.  D/c  Instructions-Education: Discharge instructions given to patient/family with verbalized understanding. D/c education completed with patient/family including follow up instructions, medication list, d/c activities limitations if indicated, with other d/c instructions as indicated by MD - patient able to verbalize understanding, all questions fully answered. Patient instructed to return to ED, call 911, or call MD for any changes in condition.  Patient escorted via WC, and D/C home via private auto.  Cristal Ford, LPN 1/61/0960 45:40 AM

## 2022-10-07 ENCOUNTER — Telehealth: Payer: Self-pay | Admitting: Licensed Clinical Social Worker

## 2022-10-07 DIAGNOSIS — E291 Testicular hypofunction: Secondary | ICD-10-CM | POA: Diagnosis not present

## 2022-10-07 DIAGNOSIS — Z6841 Body Mass Index (BMI) 40.0 and over, adult: Secondary | ICD-10-CM | POA: Diagnosis not present

## 2022-10-07 DIAGNOSIS — M539 Dorsopathy, unspecified: Secondary | ICD-10-CM | POA: Diagnosis not present

## 2022-10-07 DIAGNOSIS — E1165 Type 2 diabetes mellitus with hyperglycemia: Secondary | ICD-10-CM | POA: Diagnosis not present

## 2022-10-07 DIAGNOSIS — Z09 Encounter for follow-up examination after completed treatment for conditions other than malignant neoplasm: Secondary | ICD-10-CM | POA: Diagnosis not present

## 2022-10-07 DIAGNOSIS — R030 Elevated blood-pressure reading, without diagnosis of hypertension: Secondary | ICD-10-CM | POA: Diagnosis not present

## 2022-10-07 DIAGNOSIS — Z79899 Other long term (current) drug therapy: Secondary | ICD-10-CM | POA: Diagnosis not present

## 2022-10-07 DIAGNOSIS — I10 Essential (primary) hypertension: Secondary | ICD-10-CM | POA: Diagnosis not present

## 2022-10-07 NOTE — Transitions of Care (Post Inpatient/ED Visit) (Signed)
   10/07/2022  Name: Bradley Torres MRN: [id number] DOB: Sep 12, 1960  Today's TOC FU Call Status: Today's TOC FU Call Status:: Unsuccessul Call (1st Attempt) Unsuccessful Call (1st Attempt) Date: 10/07/22  Transition Care Management Follow-up Telephone Call Date of Discharge: 10/06/22 [facility]  [name], BSW, MSW, LCSW Managed Medicaid LCSW [facility]  [facility] [email or web address] Phone: [phone]

## 2022-10-08 HISTORY — PX: INTERVENTIONAL RADIOLOGY PROCEDURE: SHX1837

## 2022-10-11 DIAGNOSIS — Z79899 Other long term (current) drug therapy: Secondary | ICD-10-CM | POA: Diagnosis not present

## 2022-10-13 ENCOUNTER — Encounter (HOSPITAL_COMMUNITY): Payer: Self-pay | Admitting: *Deleted

## 2022-10-13 ENCOUNTER — Other Ambulatory Visit: Payer: Self-pay

## 2022-10-13 ENCOUNTER — Emergency Department (HOSPITAL_COMMUNITY)
Admission: EM | Admit: 2022-10-13 | Discharge: 2022-10-13 | Disposition: A | Discharge status: Home / Self Care | Payer: Medicaid Other | Attending: Emergency Medicine | Admitting: Emergency Medicine

## 2022-10-13 DIAGNOSIS — K5792 Diverticulitis of intestine, part unspecified, without perforation or abscess without bleeding: Secondary | ICD-10-CM | POA: Diagnosis not present

## 2022-10-13 DIAGNOSIS — Z7901 Long term (current) use of anticoagulants: Secondary | ICD-10-CM | POA: Insufficient documentation

## 2022-10-13 DIAGNOSIS — R1084 Generalized abdominal pain: Secondary | ICD-10-CM | POA: Diagnosis present

## 2022-10-13 NOTE — Discharge Instructions (Signed)
Follow-up with your doctors as planned.  If you have any more problems contact Dr. Lovell Sheehan office

## 2022-10-13 NOTE — ED Triage Notes (Signed)
Pt reports he had JP drain placed last Saturday due to a diverticular abscess. He went to Dr. Lovell Sheehan office yesterday and had the bandage changed and JP drain "cleaned out". Pt reports they had some difficulty putting the JP drain back together after they cleaned it out. Since then he has had little to no drainage from the drain until this morning when he was on the way to the hospital. Denies fever, weakness, but does report increased pain around JP drain incision site.

## 2022-10-13 NOTE — ED Provider Notes (Signed)
[place] EMERGENCY DEPARTMENT AT [facility] Provider Note   CSN: [id number] Arrival date & time: 10/13/22  9528     History {Add pertinent medical, surgical, social history, OB history to HPI:1} No chief complaint on file.   Bradley Torres is a 62 y.o. male.  Patient has diverticulitis and a JP drain.  He was seen in the office yesterday and since then his JP has not been draining.  He did say it started draining when he drove over to the emergency department   Abdominal Pain      Home Medications Prior to Admission medications   Medication Sig Start Date End Date Taking? Authorizing Provider  Accu-Chek Softclix Lancets lancets Use as instructed to monitor glucose twice daily 09/23/21   Dani Gobble, NP  allopurinol (ZYLOPRIM) 100 MG tablet Take 1 tablet (100 mg total) by mouth daily. 02/14/22   Mechele Claude, MD  amiodarone (PACERONE) 200 MG tablet Take 200 mg by mouth daily.    [provider]  amoxicillin-clavulanate (AUGMENTIN) 875-125 MG tablet Take 1 tablet by mouth every 12 (twelve) hours. 10/06/22   Catarina Hartshorn, MD  atorvastatin (LIPITOR) 20 MG tablet TAKE ONE TABLET BY MOUTH ONCE DAILY 08/01/22   Antoine Poche, MD  chlorthalidone (HYGROTON) 25 MG tablet Take 25 mg by mouth every other day.    [provider]  Cholecalciferol (VITAMIN D-3 PO) Take 1 tablet by mouth daily.    [provider]  dapagliflozin propanediol (FARXIGA) 10 MG TABS tablet TAKE ONE TABLET BY MOUTH DAILY BEFORE BREAKFAST 10/28/21   Dani Gobble, NP  docusate sodium (COLACE) 100 MG capsule Take 1 capsule (100 mg total) by mouth 2 (two) times daily. Patient taking differently: Take 100 mg by mouth 2 (two) times daily as needed (constipation.). 06/28/22   Osvaldo Shipper, MD  glucose blood (ACCU-CHEK GUIDE) test strip Use as instructed to monitor glucose twice daily 10/28/21   Dani Gobble, NP  hydrALAZINE (APRESOLINE) 25 MG tablet Take 1 tablet (25  mg total) by mouth daily. Patient not taking: Reported on 10/02/2022 09/08/22   Sharlene Dory, NP  hydrALAZINE (APRESOLINE) 50 MG tablet Take 50 mg by mouth daily.    [provider]  metFORMIN (GLUCOPHAGE-XR) 750 MG 24 hr tablet Take 2 tablets (1,500 mg total) by mouth daily with breakfast. Patient taking differently: Take 750 mg by mouth daily with breakfast. 05/11/22   Mechele Claude, MD  oxyCODONE-acetaminophen (PERCOCET) 10-325 MG tablet Take 1 tablet by mouth 3 (three) times daily as needed for pain. Patient taking differently: Take 1 tablet by mouth every 6 (six) hours as needed for pain. 06/28/22   Osvaldo Shipper, MD  polyethylene glycol (MIRALAX / GLYCOLAX) 17 g packet Take 17 g by mouth daily. Patient taking differently: Take 17 g by mouth daily as needed (constipation.). 06/28/22   Osvaldo Shipper, MD  Prasterone, DHEA, (DHEA PO) Take 1 capsule by mouth daily.    [provider]  rivaroxaban (XARELTO) 20 MG TABS tablet TAKE ONE TABLET BY MOUTH DAILY WITH SUPPER Patient taking differently: Take 20 mg by mouth daily with supper. 02/14/22   Mechele Claude, MD  senna-docusate (SENOKOT-S) 8.6-50 MG tablet Take 2 tablets by mouth at bedtime. Patient taking differently: Take 2 tablets by mouth at bedtime as needed (constipation). 06/28/22   Osvaldo Shipper, MD  sitaGLIPtin (JANUVIA) 100 MG tablet Take 1 tablet (100 mg total) by mouth daily. 10/28/21   Dani Gobble, NP  testosterone  cypionate (DEPOTESTOSTERONE CYPIONATE) 200 MG/ML injection Inject 200 mg into the muscle every 14 (fourteen) days. 07/30/21   [provider]  valsartan (DIOVAN) 160 MG tablet Take 160 mg by mouth daily.    [provider]      Allergies    Actos [pioglitazone], Zestril [lisinopril], Aldactone [spironolactone], Bystolic [nebivolol hcl], Motrin [ibuprofen], Trulicity [dulaglutide], Cozaar [losartan], Diflucan [fluconazole], Hctz [hydrochlorothiazide], and Norvasc [amlodipine]     Review of Systems   Review of Systems  Gastrointestinal:  Positive for abdominal pain.    Physical Exam Updated Vital Signs BP 125/87 (BP Location: Left Arm)   Pulse 76   Temp 97.8 F (36.6 C) (Oral)   Resp 18   Ht 6' (1.829 m)   Wt (!) 149.7 kg   SpO2 97%   BMI 44.76 kg/m  Physical Exam  ED Results / Procedures / Treatments   Labs (all labs ordered are listed, but only abnormal results are displayed) Labs Reviewed - No data to display  EKG None  Radiology No results found.  Procedures Procedures  {Document cardiac monitor, telemetry assessment procedure when appropriate:1}  Medications Ordered in ED Medications - No data to display  ED Course/ Medical Decision Making/ A&P   {Patient with a JP drain that seems to be draining now I spoke with Dr. Lovell Sheehan and he can follow-up in the office no treatment now Click here for ABCD2, HEART and other calculatorsREFRESH Note before signing :1}                          Medical Decision Making  Diverticulitis with JP drain that initially was not draining but now is working  {Document critical care time when appropriate:1} {Document review of labs and clinical decision tools ie heart score, Chads2Vasc2 etc:1}  {Document your independent review of radiology images, and any outside records:1} {Document your discussion with family members, caretakers, and with consultants:1} {Document social determinants of health affecting pt's care:1} {Document your decision making why or why not admission, treatments were needed:1} Final Clinical Impression(s) / ED Diagnoses Final diagnoses:  Diverticulitis    Rx / DC Orders ED Discharge Orders     None

## 2022-10-14 ENCOUNTER — Other Ambulatory Visit: Payer: Self-pay | Admitting: General Surgery

## 2022-10-14 ENCOUNTER — Telehealth: Payer: Self-pay | Admitting: *Deleted

## 2022-10-14 DIAGNOSIS — K5720 Diverticulitis of large intestine with perforation and abscess without bleeding: Secondary | ICD-10-CM

## 2022-10-14 NOTE — Transitions of Care (Post Inpatient/ED Visit) (Signed)
   10/14/2022  Name: Bradley Torres MRN: [id number] DOB: Nov 15, 1960  Today's TOC FU Call Status: Today's TOC FU Call Status:: Unsuccessul Call (1st Attempt) Unsuccessful Call (1st Attempt) Date: 10/14/22  Attempted to reach the patient regarding the most recent Inpatient/ED visit.  Follow Up Plan: Additional outreach attempts will be made to reach the patient to complete the Transitions of Care (Post Inpatient/ED visit) call.   Estanislado Emms RN, BSN [place]  Managed [facility] RN Care Coordinator [phone]

## 2022-10-20 ENCOUNTER — Telehealth: Payer: Self-pay | Admitting: *Deleted

## 2022-10-20 NOTE — Telephone Encounter (Signed)
Received call from patient [phone]~ telephone.   Patient was admitted to hospital on 10/01/2022 with Dx of intra-abdominal abscess due to diverticulitis with perforation. Patient discharged on 10/06/2022 to home with JP drain managed by IR. Oral antibiotics began on 10/06/2022- Augmentin 875/125 mg PO Q 12 hrs x 14 days.   Patient was seen in office to schedule appointment with Dr. Lovell Sheehan on 10/12/2022. Advised that patient would need to F/U with IR prior to appointment with Dr. Lovell Sheehan. Patient given information to contact [facility] to schedule.   Patient also requested that dressing be changed over JP drain as it had not been changed since his discharge. Noted that dressing was initialed and dated for 10/05/2022.  JP drain bulb emptied. 75 mLs of thin yellow fluid emptied out of bulb. Noted fluid had strong foul odor. Patient reported that drainage is constantly malodorous. Bulb reconnected and pressure reapplied to create suction.   Patient returned to ED on 10/13/2022 due to drain malfunction that had corrected itself prior to being seen.   Patient reports that he has (1) day remaining of Augmentin and questioned if he needs further ABTx treatment. States that drain is now collecting < 75 mLs around 3x daily. Fluid ranges in color from light yellow to brown, but is mostly light tan. No blood or clots notes in drainage. Drainage remains malodorous.   Patient denies moderate to severe lower abdominal pain, fever/ chills, etc.   Of note, patient has IR evaluation scheduled on 10/26/2022.   Please advise.

## 2022-10-21 NOTE — Telephone Encounter (Signed)
Discussed case with Dr. Lovell Sheehan.   No further ABTx required at this time.   F/U 11/01/2022.  Call placed to patient. LMTRC.

## 2022-10-25 NOTE — Telephone Encounter (Signed)
Patient aware and appt made for 11/01/2022

## 2022-10-26 ENCOUNTER — Ambulatory Visit
Admission: RE | Admit: 2022-10-26 | Discharge: 2022-10-26 | Disposition: A | Discharge status: Home / Self Care | Payer: Medicaid Other | Source: Ambulatory Visit | Attending: General Surgery | Admitting: General Surgery

## 2022-10-26 DIAGNOSIS — K5720 Diverticulitis of large intestine with perforation and abscess without bleeding: Secondary | ICD-10-CM

## 2022-10-26 DIAGNOSIS — I700 Atherosclerosis of aorta: Secondary | ICD-10-CM | POA: Diagnosis not present

## 2022-10-26 DIAGNOSIS — M549 Dorsalgia, unspecified: Secondary | ICD-10-CM | POA: Diagnosis not present

## 2022-10-26 DIAGNOSIS — M25512 Pain in left shoulder: Secondary | ICD-10-CM | POA: Diagnosis not present

## 2022-10-26 DIAGNOSIS — E119 Type 2 diabetes mellitus without complications: Secondary | ICD-10-CM | POA: Diagnosis not present

## 2022-10-26 DIAGNOSIS — K8020 Calculus of gallbladder without cholecystitis without obstruction: Secondary | ICD-10-CM | POA: Diagnosis not present

## 2022-10-26 DIAGNOSIS — I10 Essential (primary) hypertension: Secondary | ICD-10-CM | POA: Diagnosis not present

## 2022-10-26 DIAGNOSIS — K632 Fistula of intestine: Secondary | ICD-10-CM | POA: Diagnosis not present

## 2022-10-26 DIAGNOSIS — R1032 Left lower quadrant pain: Secondary | ICD-10-CM | POA: Diagnosis not present

## 2022-10-26 HISTORY — PX: IR RADIOLOGIST EVAL & MGMT: IMG5224

## 2022-10-26 MED ORDER — IOPAMIDOL (ISOVUE-300) INJECTION 61%
100.0000 mL | Freq: Once | INTRAVENOUS | Status: AC | PRN
  Administered 2022-10-26: 100 mL via INTRAVENOUS

## 2022-10-26 NOTE — Progress Notes (Signed)
Patient ID: Bradley Torres, male   DOB: 04-25-1960, 62 y.o.   MRN: [id number]       Chief Complaint:  Diverticular abscess drain follow-up  Referring Physician(s): Jenkins,Mark  History of Present Illness: Bradley Torres is a 62 y.o. male With a left lower quadrant diverticular abscess drain.  Drain was placed under CT guidance 10/01/2022.  He was discharged on oral antibiotics.  Over the last 2 weeks he has been recovering well at home.  Improvement in his abdominal pain.  No recent illness or fevers.  He is completing oral antibiotics.  He reports persistent brown fecal contaminated output into the suction bulb.  He currently is flushing the drain daily with approximately 100 to 200 cc output each day.  Follow-up CT today demonstrates near complete collapse of the previous abscess.  Stable drain catheter position.  No new abdominal or pelvic fluid collections.  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  Past Medical History:  Diagnosis Date   CAD (coronary artery disease)    a. 12/2017: cath showing 10% Proximal-LAD stenosis with no significant obstructive disease and LVEDP mildly elevated at 18 mm Hg.    Chronic back pain    Diabetes mellitus, type 2 (HCC)    DVT (deep venous thrombosis) (HCC)    Essential hypertension    Family hx of colon cancer 05/03/2017   MI (myocardial infarction) (HCC) 2019   Morbid obesity (HCC)    Panic disorder    Shoulder pain, left    Following fall   Sleep apnea    Stroke (HCC) 01/2018   Vitamin D deficiency     Past Surgical History:  Procedure Laterality Date   Ankle fracture sugery Left    BIOPSY  07/20/2020   Procedure: BIOPSY;  Surgeon: Corbin Ade, MD;  Location: [facility];  Service: Endoscopy;;  cecal polyp   COLONOSCOPY WITH PROPOFOL N/A 05/29/2017   Dr. Jena Gauss: Multiple tubular adenomas removed, prep  inadequate.  Short interval surveillance colonoscopy recommended in 6 months   COLONOSCOPY WITH PROPOFOL N/A 07/20/2020   Procedure: COLONOSCOPY WITH PROPOFOL;  Surgeon: Corbin Ade, MD;  Location: [facility];  Service: Endoscopy;  Laterality: N/A;  am appt, early as possible since diabetic   INTERVENTIONAL RADIOLOGY PROCEDURE  10/08/2022   JP drain placement due to diverticular abscess   LEFT HEART CATH AND CORONARY ANGIOGRAPHY N/A 01/23/2018   Procedure: LEFT HEART CATH AND CORONARY ANGIOGRAPHY;  Surgeon: Lennette Bihari, MD;  Location: [facility];  Service: Cardiovascular;  Laterality: N/A;   LUMBAR SPINE SURGERY     fusion   POLYPECTOMY  05/29/2017   Procedure: POLYPECTOMY;  Surgeon: Corbin Ade, MD;  Location: [facility];  Service: Endoscopy;;  ascending colon polyp x5-cs transverse colon polyp x4- cs descending colon polyp x1- hs sigmoid colon polyp x1 -hs rectal polyp x1- hs   TONSILLECTOMY      Allergies: Actos [pioglitazone], Zestril [lisinopril], Aldactone [spironolactone], Bystolic [nebivolol hcl], Motrin [ibuprofen], Trulicity [dulaglutide], Cozaar [losartan], Diflucan [fluconazole], Hctz [hydrochlorothiazide], and Norvasc [amlodipine]  Medications: Prior to Admission medications   Medication Sig Start Date End Date Taking? Authorizing Provider  Accu-Chek Softclix Lancets lancets Use as instructed to monitor glucose twice daily 09/23/21   Dani Gobble, NP  allopurinol (ZYLOPRIM) 100 MG tablet Take 1 tablet (100 mg total) by mouth daily. 02/14/22   Mechele Claude,  MD  amiodarone (PACERONE) 200 MG tablet Take 200 mg by mouth daily.    [provider]  amoxicillin-clavulanate (AUGMENTIN) 875-125 MG tablet Take 1 tablet by mouth every 12 (twelve) hours. 10/06/22   Catarina Hartshorn, MD  atorvastatin (LIPITOR) 20 MG tablet TAKE ONE TABLET BY MOUTH ONCE DAILY 08/01/22   Antoine Poche, MD  chlorthalidone (HYGROTON) 25 MG tablet Take 25 mg by mouth  every other day.    [provider]  Cholecalciferol (VITAMIN D-3 PO) Take 1 tablet by mouth daily.    [provider]  dapagliflozin propanediol (FARXIGA) 10 MG TABS tablet TAKE ONE TABLET BY MOUTH DAILY BEFORE BREAKFAST 10/28/21   Dani Gobble, NP  docusate sodium (COLACE) 100 MG capsule Take 1 capsule (100 mg total) by mouth 2 (two) times daily. Patient taking differently: Take 100 mg by mouth 2 (two) times daily as needed (constipation.). 06/28/22   Osvaldo Shipper, MD  glucose blood (ACCU-CHEK GUIDE) test strip Use as instructed to monitor glucose twice daily 10/28/21   Dani Gobble, NP  hydrALAZINE (APRESOLINE) 25 MG tablet Take 1 tablet (25 mg total) by mouth daily. Patient not taking: Reported on 10/02/2022 09/08/22   Sharlene Dory, NP  hydrALAZINE (APRESOLINE) 50 MG tablet Take 50 mg by mouth daily.    [provider]  metFORMIN (GLUCOPHAGE-XR) 750 MG 24 hr tablet Take 2 tablets (1,500 mg total) by mouth daily with breakfast. Patient taking differently: Take 750 mg by mouth daily with breakfast. 05/11/22   Mechele Claude, MD  oxyCODONE-acetaminophen (PERCOCET) 10-325 MG tablet Take 1 tablet by mouth 3 (three) times daily as needed for pain. Patient taking differently: Take 1 tablet by mouth every 6 (six) hours as needed for pain. 06/28/22   Osvaldo Shipper, MD  polyethylene glycol (MIRALAX / GLYCOLAX) 17 g packet Take 17 g by mouth daily. Patient taking differently: Take 17 g by mouth daily as needed (constipation.). 06/28/22   Osvaldo Shipper, MD  Prasterone, DHEA, (DHEA PO) Take 1 capsule by mouth daily.    [provider]  rivaroxaban (XARELTO) 20 MG TABS tablet TAKE ONE TABLET BY MOUTH DAILY WITH SUPPER Patient taking differently: Take 20 mg by mouth daily with supper. 02/14/22   Mechele Claude, MD  senna-docusate (SENOKOT-S) 8.6-50 MG tablet Take 2 tablets by mouth at bedtime. Patient taking differently: Take 2 tablets by mouth at bedtime as needed  (constipation). 06/28/22   Osvaldo Shipper, MD  sitaGLIPtin (JANUVIA) 100 MG tablet Take 1 tablet (100 mg total) by mouth daily. 10/28/21   Dani Gobble, NP  testosterone cypionate (DEPOTESTOSTERONE CYPIONATE) 200 MG/ML injection Inject 200 mg into the muscle every 14 (fourteen) days. 07/30/21   [provider]  valsartan (DIOVAN) 160 MG tablet Take 160 mg by mouth daily.    [provider]     Family History  Problem Relation Age of Onset   Colon cancer Mother        died at age 69   Heart attack Father    Hepatitis Sister 17       Autoimmune   Heart attack Paternal Grandfather     Social History   Socioeconomic History   Marital status: Single    Spouse name: Not on file   Number of children: Not on file   Years of education: Not on file   Highest education level: Not on file  Occupational History   Occupation: disabled  Tobacco Use   Smoking status: Never  Passive exposure: Never   Smokeless tobacco: Never  Vaping Use   Vaping status: Never Used  Substance and Sexual Activity   Alcohol use: Yes    Comment: occ   Drug use: Not Currently    Types: Marijuana    Comment: daily; 05/19/20 no marijuana in past 6 months d/t going to pain management clinic   Sexual activity: Not Currently    Birth control/protection: None  Other Topics Concern   Not on file  Social History Narrative   Not on file   Social Determinants of Health   Financial Resource Strain: Medium Risk (05/18/2020)   Overall Financial Resource Strain (CARDIA)    Difficulty of Paying Living Expenses: Somewhat hard  Food Insecurity: No Food Insecurity (10/01/2022)   Hunger Vital Sign    Worried About Running Out of Food in the Last Year: Never true    Ran Out of Food in the Last Year: Never true  Transportation Needs: No Transportation Needs (10/01/2022)   PRAPARE - Administrator, Civil Service (Medical): No    Lack of Transportation (Non-Medical): No  Physical Activity: Not  on file  Stress: Stress Concern Present (05/15/2019)   [employer] of Occupational Health - Occupational Stress Questionnaire    Feeling of Stress : Rather much  Social Connections: Unknown (01/16/2020)   Social Connection and Isolation Panel [NHANES]    Frequency of Communication with Friends and Family: More than three times a week    Frequency of Social Gatherings with Friends and Family: More than three times a week    Attends Religious Services: Not on Marketing executive or Organizations: Not on file    Attends Banker Meetings: Not on file    Marital Status: Not on file       Review of Systems: A 12 point ROS discussed and pertinent positives are indicated in the HPI above.  All other systems are negative.  Review of Systems  Vital Signs: There were no vitals taken for this visit.    Physical Exam Constitutional:      General: He is not in acute distress.    Appearance: He is obese. He is not toxic-appearing.  Eyes:     General: No scleral icterus. Abdominal:     General: There is no distension.     Palpations: Abdomen is soft.     Tenderness: There is no guarding.     Comments: Drain site is clean, dry and intact  Neurological:     General: No focal deficit present.     Mental Status: Mental status is at baseline.  Psychiatric:        Mood and Affect: Mood normal.        Thought Content: Thought content normal.           Imaging: CT ABDOMEN PELVIS W CONTRAST  Result Date: 10/26/2022 CLINICAL DATA:  Left lower quadrant diverticular abscess, status post percutaneous drain EXAM: CT ABDOMEN AND PELVIS WITH CONTRAST TECHNIQUE: Multidetector CT imaging of the abdomen and pelvis was performed using the standard protocol following bolus administration of intravenous contrast. RADIATION DOSE REDUCTION: This exam was performed according to the departmental dose-optimization program which includes automated exposure control, adjustment of  the mA and/or kV according to patient size and/or use of iterative reconstruction technique. CONTRAST:  ISOVUE-300 IOPAMIDOL (ISOVUE-300) INJECTION 61% COMPARISON:  10/01/2022 FINDINGS: Lower chest: No acute abnormality. Hepatobiliary: No focal hepatic abnormality or biliary obstruction pattern.  Small gallstones again noted layering dependently. Gallbladder nondistended. No surrounding inflammatory change. Common bile duct nondilated. Pancreas: Unremarkable. No pancreatic ductal dilatation or surrounding inflammatory changes. Spleen: Normal in size without focal abnormality. Adrenals/Urinary Tract: No adrenal abnormality. No acute renal abnormality, obstruction, hydronephrosis, or obstructing urinary tract calculi. Benign right kidney upper pole exophytic hypodense cyst measures 4.4 cm. No further imaging recommended. Urinary bladder unremarkable. Stomach/Bowel: Negative for bowel obstruction, significant dilatation, ileus, or free air. Normal appendix noted. Left lower quadrant drain is stable in position. Previous abscess is completely collapsed/nearly resolved. Improvement in the adjacent diverticulitis. Residual diverticular disease noted. No new abdominal or pelvic fluid collections. Vascular/Lymphatic: Aortoiliac atherosclerosis evident. No aneurysm. No acute or occlusive vascular finding. No bulky adenopathy. Reproductive: No significant finding by CT. Other: No abdominal wall hernia or abnormality. No abdominopelvic ascites. Musculoskeletal: Degenerative postop changes of the lumbar spine. No acute osseous finding. IMPRESSION: 1. Near complete resolution of the left lower quadrant diverticular abscess with stable position of the percutaneous drain. 2. Improved adjacent diverticulitis. 3. No new acute intra-abdominal or pelvic finding by CT. 4. Aortic atherosclerosis. 5. Cholelithiasis Aortic Atherosclerosis (ICD10-I70.0). Electronically Signed   By: Judie Petit.  Shondell Poulson M.D.   On: 10/26/2022 13:56   CT GUIDED  PERITONEAL/RETROPERITONEAL FLUID DRAIN BY PERC CATH  Result Date: 10/01/2022 INDICATION: 62 year old male with perforated diverticulitis and associated diverticular abscess. He presents for drain placement EXAM: CT-guided drain placement MEDICATIONS: The patient is currently admitted to the hospital and receiving intravenous antibiotics. The antibiotics were administered within an appropriate time frame prior to the initiation of the procedure. ANESTHESIA/SEDATION: Moderate (conscious) sedation was employed during this procedure. A total of Versed 2 mg and Fentanyl 100 mcg was administered intravenously by the radiology nurse. Total intra-service moderate Sedation Time: 13 minutes. The patient's level of consciousness and vital signs were monitored continuously by radiology nursing throughout the procedure under my direct supervision. COMPLICATIONS: None immediate. PROCEDURE: Informed written consent was obtained from the patient after a thorough discussion of the procedural risks, benefits and alternatives. All questions were addressed. Maximal Sterile Barrier Technique was utilized including caps, mask, sterile gowns, sterile gloves, sterile drape, hand hygiene and skin antiseptic. A timeout was performed prior to the initiation of the procedure. A planning axial CT scan was performed. The abscess collection in the left lower quadrant was successfully identified. A suitable skin entry site was selected and marked. Local anesthesia was attained by infiltration with 1% lidocaine. A small dermatotomy was made. Under intermittent CT guidance, an 18 gauge trocar needle was advanced into the fluid collection. A 0.035 wire was then coiled in the collection. The skin tract was dilated to 12 [place]. A 12 French all-purpose drainage catheter was advanced over the wire and formed. Approximately 140 mL of thick purulent fluid was then successfully aspirated. Aspirated fluid was sent for Gram stain and culture. The drain was  lavaged and connected to JP bulb suction. The drainage catheter was then secured to the skin with 0 Prolene suture. Sterile bandages were placed. Post placement CT imaging demonstrates a well-positioned drainage catheter with no evidence of complication. The patient tolerated the procedure well. IMPRESSION: Successful placement of a 12 French drainage catheter into the left lower quadrant diverticular abscess with removal of 140 mL thick purulent fluid. A sample was sent for Gram stain and culture. Recommend keeping drain to JP bulb suction and flushing at least once per shift. Electronically Signed   By: Malachy Moan M.D.   On: 10/01/2022 15:34  CT ABDOMEN PELVIS W CONTRAST  Result Date: 10/01/2022 CLINICAL DATA:  Abdominal pain for 2 days. LEFT lower quadrant pain. History of perforated diverticulitis in March 2024. EXAM: CT ABDOMEN AND PELVIS WITH CONTRAST TECHNIQUE: Multidetector CT imaging of the abdomen and pelvis was performed using the standard protocol following bolus administration of intravenous contrast. RADIATION DOSE REDUCTION: This exam was performed according to the departmental dose-optimization program which includes automated exposure control, adjustment of the mA and/or kV according to patient size and/or use of iterative reconstruction technique. CONTRAST:  80mL OMNIPAQUE IOHEXOL 300 MG/ML  SOLN COMPARISON:  CT 06/22/2022 FINDINGS: Lower chest: Lung bases are clear. Hepatobiliary: No focal hepatic lesion. Several small gallstones. No gallbladder inflammation. No biliary duct dilatation. Common bile duct is normal. Pancreas: Pancreas is normal. No ductal dilatation. No pancreatic inflammation. Spleen: Normal spleen Adrenals/urinary tract: Adrenal glands and kidneys are normal. The ureters and bladder normal. Stomach/Bowel: Stomach, duodenum are normal. There is a contained gas in fluid collection in the LEFT ventral peritoneal space measuring 9.3 x 5.8 by 10.1 cm (image 61/series 2) most  consistent with a peritoneal abscess. This fluid collection is immediately adjacent to the descending colon along the mesenteric border. Potential communication with the colon on image 58/2. The abscess is also immediately adjacent loops of small bowel in the LEFT upper quadrant and pushes these loops of small bowel medially. There is no intraperitoneal free air. No pneumatosis or portal venous gas. The distal small bowel is normal. The terminal ileum is normal. Appendix is normal. Ascending and transverse colon normal. There is inflammation along the proximal descending colon as described above. Diverticulosis of the sigmoid colon. Rectum normal. Vascular/Lymphatic: Abdominal aorta is normal caliber with atherosclerotic calcification. There is no retroperitoneal or periportal lymphadenopathy. No pelvic lymphadenopathy. Reproductive: Prostate unremarkable Other: No free fluid. Musculoskeletal: No aggressive osseous lesion. IMPRESSION: 1. Large peritoneal abscess in the LEFT ventral peritoneal space. Potential communication with the descending colon. Findings most consistent with perforated diverticulitis of the descending colon with abscess formation. No intraperitoneal free air. No pneumatosis or portal venous gas. 2. Cholelithiasis without evidence of cholecystitis. 3. Aortic atherosclerosis. Aortic Atherosclerosis (ICD10-I70.0). Electronically Signed   By: Genevive Bi M.D.   On: 10/01/2022 09:54    Labs:  CBC: Recent Labs    10/03/22 0353 10/04/22 0404 10/05/22 0403 10/06/22 0419  WBC 11.0* 11.9* 10.8* 10.2  HGB 11.5* 11.8* 11.3* 12.1*  HCT 36.5* 37.5* 36.1* 38.6*  PLT 334 370 373 408*    COAGS: No results for input(s): "INR", "APTT" in the last 8760 hours.  BMP: Recent Labs    10/03/22 0353 10/04/22 0404 10/05/22 0403 10/06/22 0419  NA 133* 134* 133* 133*  K 3.1* 4.1 4.0 4.2  CL 96* 97* 96* 96*  CO2 28 27 29 29   GLUCOSE 128* 106* 174* 103*  BUN 19 17 17 17   CALCIUM 8.2* 8.4*  8.5* 8.7*  CREATININE 1.41* 1.45* 1.46* 1.45*  GFRNONAA 57* 55* 55* 55*    LIVER FUNCTION TESTS: Recent Labs    06/20/22 0409 07/08/22 1123 08/02/22 1608 10/01/22 0829  BILITOT 1.2 0.5 0.8 1.0  AST 13* 32 19 13*  ALT 15 29 18 10   ALKPHOS 50 70 79 56  PROT 6.3* 6.7 7.2 7.1  ALBUMIN 2.9* 3.2* 3.9 2.9*       Assessment and Plan:  Diverticular abscess has resolved by CT.  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  Plan: Switch from suction  bulb to gravity drainage.  Discontinue flushing.   repeat fluoroscopic injection in 2 weeks to assess fistula healing.  No further CT necessary.  Electronically Signed: Berdine Dance 10/26/2022, 2:26 PM   I spent a total of  40 Minutes   in face to face in clinical consultation, greater than 50% of which was counseling/coordinating care for This patient with a diverticular abscess drain

## 2022-10-27 ENCOUNTER — Other Ambulatory Visit: Payer: Self-pay | Admitting: General Surgery

## 2022-10-27 DIAGNOSIS — K5720 Diverticulitis of large intestine with perforation and abscess without bleeding: Secondary | ICD-10-CM

## 2022-11-01 ENCOUNTER — Ambulatory Visit: Payer: Medicaid Other | Admitting: General Surgery

## 2022-11-01 ENCOUNTER — Encounter: Payer: Self-pay | Admitting: General Surgery

## 2022-11-01 VITALS — BP 130/78 | HR 79 | Temp 97.9°F | Resp 18 | Ht 72.0 in | Wt 330.0 lb

## 2022-11-01 DIAGNOSIS — K5732 Diverticulitis of large intestine without perforation or abscess without bleeding: Secondary | ICD-10-CM | POA: Diagnosis not present

## 2022-11-01 NOTE — Progress Notes (Signed)
Subjective:     Bradley Torres  Here for follow-up visit, status post percutaneous drainage of sigmoid diverticular abscess.  He now has a fistula that is present.  The abscess has resolved.  His drain is to gravity.  He states about 100 cc of cloudy brown fluid is emptied every other day.  He is having regular bowel movements.  He denies any fever or chills.  He denies any significant abdominal pain. Objective:    BP 130/78   Pulse 79   Temp 97.9 F (36.6 C) (Oral)   Resp 18   Ht 6' (1.829 m)   Wt (!) 330 lb (149.7 kg)   SpO2 96%   BMI 44.76 kg/m   General:  alert, cooperative, and no distress  Abdomen is soft, nontender, nondistended.  Cloudy brown fluid noted in drainage bag. Reviewed IRs last note.     Assessment:    Sigmoid diverticular abscess, resolved.  Does have residual fistula present.    Plan:   No need for acute surgical intervention at the present time.  Does have follow-up fistulogram on 11/14/2022.  Will see what that shows and proceed from there.

## 2022-11-04 ENCOUNTER — Ambulatory Visit: Payer: Medicaid Other | Admitting: Cardiology

## 2022-11-04 DIAGNOSIS — M539 Dorsopathy, unspecified: Secondary | ICD-10-CM | POA: Diagnosis not present

## 2022-11-04 DIAGNOSIS — E1165 Type 2 diabetes mellitus with hyperglycemia: Secondary | ICD-10-CM | POA: Diagnosis not present

## 2022-11-04 DIAGNOSIS — I10 Essential (primary) hypertension: Secondary | ICD-10-CM | POA: Diagnosis not present

## 2022-11-04 DIAGNOSIS — Z6841 Body Mass Index (BMI) 40.0 and over, adult: Secondary | ICD-10-CM | POA: Diagnosis not present

## 2022-11-04 DIAGNOSIS — Z79899 Other long term (current) drug therapy: Secondary | ICD-10-CM | POA: Diagnosis not present

## 2022-11-04 DIAGNOSIS — E7800 Pure hypercholesterolemia, unspecified: Secondary | ICD-10-CM | POA: Diagnosis not present

## 2022-11-04 DIAGNOSIS — R030 Elevated blood-pressure reading, without diagnosis of hypertension: Secondary | ICD-10-CM | POA: Diagnosis not present

## 2022-11-04 DIAGNOSIS — M542 Cervicalgia: Secondary | ICD-10-CM | POA: Diagnosis not present

## 2022-11-04 DIAGNOSIS — M549 Dorsalgia, unspecified: Secondary | ICD-10-CM | POA: Diagnosis not present

## 2022-11-04 DIAGNOSIS — M545 Low back pain, unspecified: Secondary | ICD-10-CM | POA: Diagnosis not present

## 2022-11-04 DIAGNOSIS — R5383 Other fatigue: Secondary | ICD-10-CM | POA: Diagnosis not present

## 2022-11-04 DIAGNOSIS — E291 Testicular hypofunction: Secondary | ICD-10-CM | POA: Diagnosis not present

## 2022-11-04 DIAGNOSIS — Z125 Encounter for screening for malignant neoplasm of prostate: Secondary | ICD-10-CM | POA: Diagnosis not present

## 2022-11-14 ENCOUNTER — Other Ambulatory Visit: Payer: Self-pay | Admitting: General Surgery

## 2022-11-14 ENCOUNTER — Ambulatory Visit
Admission: RE | Admit: 2022-11-14 | Discharge: 2022-11-14 | Disposition: A | Discharge status: Home / Self Care | Payer: Medicaid Other | Source: Ambulatory Visit | Attending: General Surgery | Admitting: General Surgery

## 2022-11-14 DIAGNOSIS — K5720 Diverticulitis of large intestine with perforation and abscess without bleeding: Secondary | ICD-10-CM

## 2022-11-14 NOTE — Progress Notes (Signed)
Chief Complaint: Diverticular abscess drain follow-up  Referring Physician(s): Jenkins,Mark  History of Present Illness: Bradley Torres is a 62 y.o. male with a left lower quadrant diverticular abscess drain, placed under CT guidance 10/01/2022, returns to IR clinic for follow up.  Abscessogram on 10/26/2022 showed small residual cavity with fistulous communication to colon.  He was switched from bulb to a bag and instructed to stop flushing.    He has had mild abdominal pain this week but the pain is significantly improved compared to his initial presentation.    He report minimal drain output over the past 3 days.  Past Medical History:  Diagnosis Date   CAD (coronary artery disease)    a. 12/2017: cath showing 10% Proximal-LAD stenosis with no significant obstructive disease and LVEDP mildly elevated at 18 mm Hg.    Chronic back pain    Diabetes mellitus, type 2 (HCC)    DVT (deep venous thrombosis) (HCC)    Essential hypertension    Family hx of colon cancer 05/03/2017   MI (myocardial infarction) (HCC) 2019   Morbid obesity (HCC)    Panic disorder    Shoulder pain, left    Following fall   Sleep apnea    Stroke (HCC) 01/2018   Vitamin D deficiency     Past Surgical History:  Procedure Laterality Date   Ankle fracture sugery Left    BIOPSY  07/20/2020   Procedure: BIOPSY;  Surgeon: Corbin Ade, MD;  Location: [facility];  Service: Endoscopy;;  cecal polyp   COLONOSCOPY WITH PROPOFOL N/A 05/29/2017   Dr. Jena Gauss: Multiple tubular adenomas removed, prep inadequate.  Short interval surveillance colonoscopy recommended in 6 months   COLONOSCOPY WITH PROPOFOL N/A 07/20/2020   Procedure: COLONOSCOPY WITH PROPOFOL;  Surgeon: Corbin Ade, MD;  Location: [facility];  Service: Endoscopy;  Laterality: N/A;  am appt, early as possible since diabetic   INTERVENTIONAL RADIOLOGY PROCEDURE  10/08/2022   JP drain placement due to diverticular abscess   IR  RADIOLOGIST EVAL & MGMT  10/26/2022   LEFT HEART CATH AND CORONARY ANGIOGRAPHY N/A 01/23/2018   Procedure: LEFT HEART CATH AND CORONARY ANGIOGRAPHY;  Surgeon: Lennette Bihari, MD;  Location: [facility];  Service: Cardiovascular;  Laterality: N/A;   LUMBAR SPINE SURGERY     fusion   POLYPECTOMY  05/29/2017   Procedure: POLYPECTOMY;  Surgeon: Corbin Ade, MD;  Location: [facility];  Service: Endoscopy;;  ascending colon polyp x5-cs transverse colon polyp x4- cs descending colon polyp x1- hs sigmoid colon polyp x1 -hs rectal polyp x1- hs   TONSILLECTOMY      Allergies: Actos [pioglitazone], Zestril [lisinopril], Aldactone [spironolactone], Bystolic [nebivolol hcl], Motrin [ibuprofen], Trulicity [dulaglutide], Cozaar [losartan], Diflucan [fluconazole], Hctz [hydrochlorothiazide], and Norvasc [amlodipine]  Medications: Prior to Admission medications   Medication Sig Start Date End Date Taking? Authorizing Provider  Accu-Chek Softclix Lancets lancets Use as instructed to monitor glucose twice daily 09/23/21   Dani Gobble, NP  allopurinol (ZYLOPRIM) 100 MG tablet Take 1 tablet (100 mg total) by mouth daily. 02/14/22   Mechele Claude, MD  amiodarone (PACERONE) 200 MG tablet Take 200 mg by mouth daily.    [provider]  atorvastatin (LIPITOR) 20 MG tablet TAKE ONE TABLET BY MOUTH ONCE DAILY 08/01/22   Antoine Poche, MD  chlorthalidone (HYGROTON) 25 MG tablet Take 25 mg by mouth every other day.    [provider]  Cholecalciferol (VITAMIN  D-3 PO) Take 1 tablet by mouth daily.    [provider]  dapagliflozin propanediol (FARXIGA) 10 MG TABS tablet TAKE ONE TABLET BY MOUTH DAILY BEFORE BREAKFAST 10/28/21   Dani Gobble, NP  docusate sodium (COLACE) 100 MG capsule Take 1 capsule (100 mg total) by mouth 2 (two) times daily. Patient taking differently: Take 100 mg by mouth 2 (two) times daily as needed (constipation.). 06/28/22   Osvaldo Shipper,  MD  glucose blood (ACCU-CHEK GUIDE) test strip Use as instructed to monitor glucose twice daily 10/28/21   Dani Gobble, NP  hydrALAZINE (APRESOLINE) 25 MG tablet Take 1 tablet (25 mg total) by mouth daily. 09/08/22   Sharlene Dory, NP  hydrALAZINE (APRESOLINE) 50 MG tablet Take 50 mg by mouth daily.    [provider]  metFORMIN (GLUCOPHAGE-XR) 750 MG 24 hr tablet Take 2 tablets (1,500 mg total) by mouth daily with breakfast. Patient taking differently: Take 750 mg by mouth daily with breakfast. 05/11/22   Mechele Claude, MD  oxyCODONE-acetaminophen (PERCOCET) 10-325 MG tablet Take 1 tablet by mouth 3 (three) times daily as needed for pain. Patient taking differently: Take 1 tablet by mouth every 6 (six) hours as needed for pain. 06/28/22   Osvaldo Shipper, MD  polyethylene glycol (MIRALAX / GLYCOLAX) 17 g packet Take 17 g by mouth daily. Patient taking differently: Take 17 g by mouth daily as needed (constipation.). 06/28/22   Osvaldo Shipper, MD  Prasterone, DHEA, (DHEA PO) Take 1 capsule by mouth daily.    [provider]  rivaroxaban (XARELTO) 20 MG TABS tablet TAKE ONE TABLET BY MOUTH DAILY WITH SUPPER Patient taking differently: Take 20 mg by mouth daily with supper. 02/14/22   Mechele Claude, MD  senna-docusate (SENOKOT-S) 8.6-50 MG tablet Take 2 tablets by mouth at bedtime. Patient taking differently: Take 2 tablets by mouth at bedtime as needed (constipation). 06/28/22   Osvaldo Shipper, MD  sitaGLIPtin (JANUVIA) 100 MG tablet Take 1 tablet (100 mg total) by mouth daily. 10/28/21   Dani Gobble, NP  testosterone cypionate (DEPOTESTOSTERONE CYPIONATE) 200 MG/ML injection Inject 200 mg into the muscle every 14 (fourteen) days. 07/30/21   [provider]  valsartan (DIOVAN) 160 MG tablet Take 160 mg by mouth daily.    [provider]     Family History  Problem Relation Age of Onset   Colon cancer Mother        died at age 50   Heart attack Father     Hepatitis Sister 22       Autoimmune   Heart attack Paternal Grandfather     Social History   Socioeconomic History   Marital status: Single    Spouse name: Not on file   Number of children: Not on file   Years of education: Not on file   Highest education level: Not on file  Occupational History   Occupation: disabled  Tobacco Use   Smoking status: Never    Passive exposure: Never   Smokeless tobacco: Never  Vaping Use   Vaping status: Never Used  Substance and Sexual Activity   Alcohol use: Yes    Comment: occ   Drug use: Not Currently    Types: Marijuana    Comment: daily; 05/19/20 no marijuana in past 6 months d/t going to pain management clinic   Sexual activity: Not Currently    Birth control/protection: None  Other Topics Concern   Not on file  Social History Narrative  Not on file   Social Determinants of Health   Financial Resource Strain: Medium Risk (05/18/2020)   Overall Financial Resource Strain (CARDIA)    Difficulty of Paying Living Expenses: Somewhat hard  Food Insecurity: No Food Insecurity (10/01/2022)   Hunger Vital Sign    Worried About Running Out of Food in the Last Year: Never true    Ran Out of Food in the Last Year: Never true  Transportation Needs: No Transportation Needs (10/01/2022)   PRAPARE - Administrator, Civil Service (Medical): No    Lack of Transportation (Non-Medical): No  Physical Activity: Not on file  Stress: Stress Concern Present (05/15/2019)   [employer] of Occupational Health - Occupational Stress Questionnaire    Feeling of Stress : Rather much  Social Connections: Unknown (01/16/2020)   Social Connection and Isolation Panel [NHANES]    Frequency of Communication with Friends and Family: More than three times a week    Frequency of Social Gatherings with Friends and Family: More than three times a week    Attends Religious Services: Not on Marketing executive or Organizations: Not on file     Attends Banker Meetings: Not on file    Marital Status: Not on file   Review of Systems: A 12 point ROS discussed and pertinent positives are indicated in the HPI above.  All other systems are negative.  Review of Systems  Vital Signs: There were no vitals taken for this visit.  Physical Exam Constitutional:      Appearance: He is obese.  Pulmonary:     Effort: Pulmonary effort is normal.  Abdominal:     General: Abdomen is flat.     Palpations: Abdomen is soft.    Neurological:     Mental Status: He is alert.  Psychiatric:        Mood and Affect: Mood normal.        Behavior: Behavior normal.     Imaging: IR Radiologist Eval & Mgmt  Result Date: 10/26/2022 : Please refer to the dictated note in [facility] for this interventional radiology eval and management. Electronically Signed   By: Judie Petit.  Shick M.D.   On: 10/26/2022 14:26   DG Sinus/Fist Tube Chk-Non GI  Result Date: 10/26/2022 INDICATION: Diverticular abscess drain EXAM: Fluoroscopic drain injection MEDICATIONS: Patient remains on oral antibiotics as an outpatient ANESTHESIA/SEDATION: None. COMPLICATIONS: None immediate. PROCEDURE: Previous imaging reviewed. Left lower quadrant abscess drain was injected with contrast. Fluoroscopic imaging performed. 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 Contrast aspirated from the drain. Gravity drainage bag connected. IMPRESSION: 1. 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 Suction bulb switched to gravity drainage bag. Flushing also discontinued. 2. Recheck in 2 weeks. Electronically Signed   By: Judie Petit.  Shick M.D.   On: 10/26/2022 14:25   CT ABDOMEN PELVIS W CONTRAST  Result Date: 10/26/2022 CLINICAL DATA:  Left lower quadrant diverticular abscess, status post percutaneous drain EXAM: CT ABDOMEN AND PELVIS WITH CONTRAST TECHNIQUE: Multidetector CT imaging of the  abdomen and pelvis was performed using the standard protocol following bolus administration of intravenous contrast. RADIATION DOSE REDUCTION: This exam was performed according to the departmental dose-optimization program which includes automated exposure control, adjustment of the mA and/or kV according to patient size and/or use of iterative reconstruction technique. CONTRAST:  ISOVUE-300 IOPAMIDOL (ISOVUE-300) INJECTION 61% COMPARISON:  10/01/2022  FINDINGS: Lower chest: No acute abnormality. Hepatobiliary: No focal hepatic abnormality or biliary obstruction pattern. Small gallstones again noted layering dependently. Gallbladder nondistended. No surrounding inflammatory change. Common bile duct nondilated. Pancreas: Unremarkable. No pancreatic ductal dilatation or surrounding inflammatory changes. Spleen: Normal in size without focal abnormality. Adrenals/Urinary Tract: No adrenal abnormality. No acute renal abnormality, obstruction, hydronephrosis, or obstructing urinary tract calculi. Benign right kidney upper pole exophytic hypodense cyst measures 4.4 cm. No further imaging recommended. Urinary bladder unremarkable. Stomach/Bowel: Negative for bowel obstruction, significant dilatation, ileus, or free air. Normal appendix noted. Left lower quadrant drain is stable in position. Previous abscess is completely collapsed/nearly resolved. Improvement in the adjacent diverticulitis. Residual diverticular disease noted. No new abdominal or pelvic fluid collections. Vascular/Lymphatic: Aortoiliac atherosclerosis evident. No aneurysm. No acute or occlusive vascular finding. No bulky adenopathy. Reproductive: No significant finding by CT. Other: No abdominal wall hernia or abnormality. No abdominopelvic ascites. Musculoskeletal: Degenerative postop changes of the lumbar spine. No acute osseous finding. IMPRESSION: 1. Near complete resolution of the left lower quadrant diverticular abscess with stable position of  the percutaneous drain. 2. Improved adjacent diverticulitis. 3. No new acute intra-abdominal or pelvic finding by CT. 4. Aortic atherosclerosis. 5. Cholelithiasis Aortic Atherosclerosis (ICD10-I70.0). Electronically Signed   By: Judie Petit.  Shick M.D.   On: 10/26/2022 13:56    Labs:  CBC: Recent Labs    10/03/22 0353 10/04/22 0404 10/05/22 0403 10/06/22 0419  WBC 11.0* 11.9* 10.8* 10.2  HGB 11.5* 11.8* 11.3* 12.1*  HCT 36.5* 37.5* 36.1* 38.6*  PLT 334 370 373 408*    COAGS: No results for input(s): "INR", "APTT" in the last 8760 hours.  BMP: Recent Labs    10/03/22 0353 10/04/22 0404 10/05/22 0403 10/06/22 0419  NA 133* 134* 133* 133*  K 3.1* 4.1 4.0 4.2  CL 96* 97* 96* 96*  CO2 28 27 29 29   GLUCOSE 128* 106* 174* 103*  BUN 19 17 17 17   CALCIUM 8.2* 8.4* 8.5* 8.7*  CREATININE 1.41* 1.45* 1.46* 1.45*  GFRNONAA 57* 55* 55* 55*    LIVER FUNCTION TESTS: Recent Labs    06/20/22 0409 07/08/22 1123 08/02/22 1608 10/01/22 0829  BILITOT 1.2 0.5 0.8 1.0  AST 13* 32 19 13*  ALT 15 29 18 10   ALKPHOS 50 70 79 56  PROT 6.3* 6.7 7.2 7.1  ALBUMIN 2.9* 3.2* 3.9 2.9*    TUMOR MARKERS: No results for input(s): "AFPTM", "CEA", "CA199", "CHROMGRNA" in the last 8760 hours.  Assessment and Plan: 62 year old gentleman with history of diverticular abscess, treated by percutaneous drain placement on 10/01/2022, returns to IR clinic for follow up.  He has had mild abdominal pain and report minimal output from the drain over the past 3 days.  He denies any fever or chills.  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    Plan:   - Maintain drain to bag.  No flushing. - Return in 10-14 days for repeat fluoro drain injection   Thank you for this interesting consult.  I greatly enjoyed meeting THORNE SIEGENTHALER and look forward to participating in their care.  A copy of this report was sent to the requesting provider on this  date.  Electronically Signed: Al Corpus Shiva Sahagian 11/14/2022, 1:47 PM   I spent a total of 25 Minutes in face to face in clinical consultation, greater than 50% of which was counseling/coordinating care for diverticular abscess drain.

## 2022-11-25 ENCOUNTER — Ambulatory Visit
Admission: RE | Admit: 2022-11-25 | Discharge: 2022-11-25 | Disposition: A | Discharge status: Home / Self Care | Payer: Medicaid Other | Source: Ambulatory Visit | Attending: General Surgery | Admitting: General Surgery

## 2022-11-25 ENCOUNTER — Other Ambulatory Visit: Payer: Medicaid Other

## 2022-11-25 ENCOUNTER — Other Ambulatory Visit (HOSPITAL_COMMUNITY): Payer: Self-pay | Admitting: Interventional Radiology

## 2022-11-25 DIAGNOSIS — K5720 Diverticulitis of large intestine with perforation and abscess without bleeding: Secondary | ICD-10-CM

## 2022-11-25 DIAGNOSIS — K5780 Diverticulitis of intestine, part unspecified, with perforation and abscess without bleeding: Secondary | ICD-10-CM | POA: Diagnosis not present

## 2022-11-30 ENCOUNTER — Other Ambulatory Visit: Payer: Self-pay | Admitting: General Surgery

## 2022-11-30 DIAGNOSIS — K5720 Diverticulitis of large intestine with perforation and abscess without bleeding: Secondary | ICD-10-CM

## 2022-12-05 DIAGNOSIS — I10 Essential (primary) hypertension: Secondary | ICD-10-CM | POA: Diagnosis not present

## 2022-12-05 DIAGNOSIS — E1165 Type 2 diabetes mellitus with hyperglycemia: Secondary | ICD-10-CM | POA: Diagnosis not present

## 2022-12-05 DIAGNOSIS — E291 Testicular hypofunction: Secondary | ICD-10-CM | POA: Diagnosis not present

## 2022-12-05 DIAGNOSIS — M539 Dorsopathy, unspecified: Secondary | ICD-10-CM | POA: Diagnosis not present

## 2022-12-05 DIAGNOSIS — Z6841 Body Mass Index (BMI) 40.0 and over, adult: Secondary | ICD-10-CM | POA: Diagnosis not present

## 2022-12-05 DIAGNOSIS — R030 Elevated blood-pressure reading, without diagnosis of hypertension: Secondary | ICD-10-CM | POA: Diagnosis not present

## 2022-12-05 DIAGNOSIS — Z79899 Other long term (current) drug therapy: Secondary | ICD-10-CM | POA: Diagnosis not present

## 2022-12-12 ENCOUNTER — Ambulatory Visit
Admission: RE | Admit: 2022-12-12 | Discharge: 2022-12-12 | Disposition: A | Discharge status: Home / Self Care | Payer: Medicaid Other | Source: Ambulatory Visit | Attending: General Surgery | Admitting: General Surgery

## 2022-12-12 DIAGNOSIS — K5720 Diverticulitis of large intestine with perforation and abscess without bleeding: Secondary | ICD-10-CM

## 2022-12-12 NOTE — Progress Notes (Signed)
Patient ID: Bradley Torres, male   DOB: [birth year], 61 y.o.   MRN: [id number]       Chief Complaint: Patient was seen in consultation today for Diverticular abscess drain follow-up    at the request of Jenkins,Mark  Referring Physician(s): Franky Macho  History of Present Illness: Bradley Torres is a 62 y.o. male with a left lower quadrant diverticular abscess 10/01/2022 abscess drain, placed under CT guidance   10/26/2022 Abscessogram on showed small residual cavity with fistulous communication to colon.  He was switched from bulb to a bag and instructed to stop flushing.  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  11/25/22 Little improvement in abscess cavity and fistula, placed to gravity bag with low volume flushes BID.  Today, he is having no significant abdominal pain. Reports output ~1104ml daily more like pus than poop.     Past Medical History:  Diagnosis Date   CAD (coronary artery disease)    a. 12/2017: cath showing 10% Proximal-LAD stenosis with no significant obstructive disease and LVEDP mildly elevated at 18 mm Hg.    Chronic back pain    Diabetes mellitus, type 2 (HCC)    DVT (deep venous thrombosis) (HCC)    Essential hypertension    Family hx of colon cancer 05/03/2017   MI (myocardial infarction) (HCC) 2019   Morbid obesity (HCC)    Panic disorder    Shoulder pain, left    Following fall   Sleep apnea    Stroke (HCC) 01/2018   Vitamin D deficiency     Past Surgical History:  Procedure Laterality Date   Ankle fracture sugery Left    BIOPSY  07/20/2020   Procedure: BIOPSY;  Surgeon: Corbin Ade, MD;  Location: [facility];  Service: Endoscopy;;  cecal polyp   COLONOSCOPY WITH PROPOFOL N/A 05/29/2017   Dr. Jena Gauss: Multiple tubular adenomas removed, prep inadequate.  Short interval surveillance colonoscopy recommended in 6 months   COLONOSCOPY WITH PROPOFOL N/A 07/20/2020   Procedure: COLONOSCOPY WITH  PROPOFOL;  Surgeon: Corbin Ade, MD;  Location: [facility];  Service: Endoscopy;  Laterality: N/A;  am appt, early as possible since diabetic   INTERVENTIONAL RADIOLOGY PROCEDURE  10/08/2022   JP drain placement due to diverticular abscess   IR RADIOLOGIST EVAL & MGMT  10/26/2022   LEFT HEART CATH AND CORONARY ANGIOGRAPHY N/A 01/23/2018   Procedure: LEFT HEART CATH AND CORONARY ANGIOGRAPHY;  Surgeon: Lennette Bihari, MD;  Location: [facility];  Service: Cardiovascular;  Laterality: N/A;   LUMBAR SPINE SURGERY     fusion   POLYPECTOMY  05/29/2017   Procedure: POLYPECTOMY;  Surgeon: Corbin Ade, MD;  Location: [facility];  Service: Endoscopy;;  ascending colon polyp x5-cs transverse colon polyp x4- cs descending colon polyp x1- hs sigmoid colon polyp x1 -hs rectal polyp x1- hs   TONSILLECTOMY      Allergies: Actos [pioglitazone], Zestril [lisinopril], Aldactone [spironolactone], Bystolic [nebivolol hcl], Motrin [ibuprofen], Trulicity [dulaglutide], Cozaar [losartan], Diflucan [fluconazole], Hctz [hydrochlorothiazide], and Norvasc [amlodipine]  Medications: Prior to Admission medications   Medication Sig Start Date End Date Taking? Authorizing Provider  Accu-Chek Softclix Lancets lancets Use as instructed to monitor glucose twice daily 09/23/21   Dani Gobble, NP  allopurinol (ZYLOPRIM) 100 MG tablet Take 1 tablet (100 mg total) by mouth daily. 02/14/22   Mechele Claude, MD  amiodarone (PACERONE) 200 MG tablet Take 200 mg by mouth daily.  [provider]  atorvastatin (LIPITOR) 20 MG tablet TAKE ONE TABLET BY MOUTH ONCE DAILY 08/01/22   Antoine Poche, MD  chlorthalidone (HYGROTON) 25 MG tablet Take 25 mg by mouth every other day.    [provider]  Cholecalciferol (VITAMIN D-3 PO) Take 1 tablet by mouth daily.    [provider]  dapagliflozin propanediol (FARXIGA) 10 MG TABS tablet TAKE ONE TABLET BY MOUTH DAILY BEFORE BREAKFAST  10/28/21   Dani Gobble, NP  docusate sodium (COLACE) 100 MG capsule Take 1 capsule (100 mg total) by mouth 2 (two) times daily. Patient taking differently: Take 100 mg by mouth 2 (two) times daily as needed (constipation.). 06/28/22   Osvaldo Shipper, MD  glucose blood (ACCU-CHEK GUIDE) test strip Use as instructed to monitor glucose twice daily 10/28/21   Dani Gobble, NP  hydrALAZINE (APRESOLINE) 25 MG tablet Take 1 tablet (25 mg total) by mouth daily. 09/08/22   Sharlene Dory, NP  hydrALAZINE (APRESOLINE) 50 MG tablet Take 50 mg by mouth daily.    [provider]  metFORMIN (GLUCOPHAGE-XR) 750 MG 24 hr tablet Take 2 tablets (1,500 mg total) by mouth daily with breakfast. Patient taking differently: Take 750 mg by mouth daily with breakfast. 05/11/22   Mechele Claude, MD  oxyCODONE-acetaminophen (PERCOCET) 10-325 MG tablet Take 1 tablet by mouth 3 (three) times daily as needed for pain. Patient taking differently: Take 1 tablet by mouth every 6 (six) hours as needed for pain. 06/28/22   Osvaldo Shipper, MD  polyethylene glycol (MIRALAX / GLYCOLAX) 17 g packet Take 17 g by mouth daily. Patient taking differently: Take 17 g by mouth daily as needed (constipation.). 06/28/22   Osvaldo Shipper, MD  Prasterone, DHEA, (DHEA PO) Take 1 capsule by mouth daily.    [provider]  rivaroxaban (XARELTO) 20 MG TABS tablet TAKE ONE TABLET BY MOUTH DAILY WITH SUPPER Patient taking differently: Take 20 mg by mouth daily with supper. 02/14/22   Mechele Claude, MD  senna-docusate (SENOKOT-S) 8.6-50 MG tablet Take 2 tablets by mouth at bedtime. Patient taking differently: Take 2 tablets by mouth at bedtime as needed (constipation). 06/28/22   Osvaldo Shipper, MD  sitaGLIPtin (JANUVIA) 100 MG tablet Take 1 tablet (100 mg total) by mouth daily. 10/28/21   Dani Gobble, NP  testosterone cypionate (DEPOTESTOSTERONE CYPIONATE) 200 MG/ML injection Inject 200 mg into the muscle every 14 (fourteen)  days. 07/30/21   [provider]  valsartan (DIOVAN) 160 MG tablet Take 160 mg by mouth daily.    [provider]     Family History  Problem Relation Age of Onset   Colon cancer Mother        died at age 81   Heart attack Father    Hepatitis Sister 25       Autoimmune   Heart attack Paternal Grandfather     Social History   Socioeconomic History   Marital status: Single    Spouse name: Not on file   Number of children: Not on file   Years of education: Not on file   Highest education level: Not on file  Occupational History   Occupation: disabled  Tobacco Use   Smoking status: Never    Passive exposure: Never   Smokeless tobacco: Never  Vaping Use   Vaping status: Never Used  Substance and Sexual Activity   Alcohol use: Yes    Comment: occ   Drug use: Not Currently    Types:  Marijuana    Comment: daily; 05/19/20 no marijuana in past 6 months d/t going to pain management clinic   Sexual activity: Not Currently    Birth control/protection: None  Other Topics Concern   Not on file  Social History Narrative   Not on file   Social Determinants of Health   Financial Resource Strain: Medium Risk (05/18/2020)   Overall Financial Resource Strain (CARDIA)    Difficulty of Paying Living Expenses: Somewhat hard  Food Insecurity: No Food Insecurity (10/01/2022)   Hunger Vital Sign    Worried About Running Out of Food in the Last Year: Never true    Ran Out of Food in the Last Year: Never true  Transportation Needs: No Transportation Needs (10/01/2022)   PRAPARE - Administrator, Civil Service (Medical): No    Lack of Transportation (Non-Medical): No  Physical Activity: Not on file  Stress: Stress Concern Present (05/15/2019)   [employer] of Occupational Health - Occupational Stress Questionnaire    Feeling of Stress : Rather much  Social Connections: Unknown (01/16/2020)   Social Connection and Isolation Panel [NHANES]    Frequency of  Communication with Friends and Family: More than three times a week    Frequency of Social Gatherings with Friends and Family: More than three times a week    Attends Religious Services: Not on Marketing executive or Organizations: Not on file    Attends Banker Meetings: Not on file    Marital Status: Not on file    ECOG Status: 1 - Symptomatic but completely ambulatory  Review of Systems: A 12 point ROS discussed and pertinent positives are indicated in the HPI above.  All other systems are negative.  Review of Systems  Vital Signs: There were no vitals taken for this visit.    Physical Exam Constitutional: Oriented to person, place, and time. Well-developed and well-nourished. No distress.   HENT:  Head: Normocephalic and atraumatic.  Eyes: Conjunctivae and EOM are normal. Right eye exhibits no discharge. Left eye exhibits no discharge. No scleral icterus.  Neck: No JVD present.  Pulmonary/Chest: Effort normal. No stridor. No respiratory distress.  Abdomen: soft, non distended. Redness and early skin breakdown inferior to drain site.  Neurological:  alert and oriented to person, place, and time.  Skin: Skin is warm and dry.  not diaphoretic.  Psychiatric:   normal mood and affect.   behavior is normal. Judgment and thought content normal.   Mallampati Score:     Imaging: DG Sinus/Fist Tube Chk-Non GI  Result Date: 11/25/2022 CLINICAL DATA:  Diverticular abscess , status post percutaneous drain catheter placement 10/01/2022. variable daily output from drain catheter. No flushes currently. EXAM: ABSCESS DRAIN CATHETER INJECTION UNDER FLUOROSCOPY TECHNIQUE: The procedure, risks (including but not limited to bleeding, infection, organ damage), benefits, and alternatives were explained to the patient. Questions regarding the procedure were encouraged and answered. The patient understands and consents to the procedure. Survey fluoroscopic inspection  reveals stable position of left lower quadrant drain catheter. 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 IMPRESSION: 1. Little improvement of abscess cavity since prior study, with persistent colonic fistula. PLAN: 1. Resume b.i.d. low volume saline flushes of drain catheter to maintain patency and maximize decompression of the abscess cavity. 2. Record daily outputs. 3. Follow-up injection under fluoro in 2 weeks to assess progress. Electronically Signed   By: Algis Downs  Deanne Coffer M.D.   On: 11/25/2022 14:12   DG Sinus/Fist Tube Chk-Non GI  Result Date: 11/14/2022 INDICATION: 62 year old gentleman with left abdominal diverticular abscess drain, initially placed on 10/01/2022, returns to IR clinic for follow-up. EXAM: Fluoroscopic abscessogram MEDICATIONS: The patient is currently admitted to the hospital and receiving intravenous antibiotics. The antibiotics were administered within an appropriate time frame prior to the initiation of the procedure. ANESTHESIA/SEDATION: None COMPLICATIONS: None immediate. PROCEDURE: Informed written consent was obtained from the patient after a thorough discussion of the procedural risks, benefits and alternatives. A timeout was performed prior to the initiation of the procedure. Scout image demonstrates left abdominal drain in unchanged position. Contrast administered through the drain shows minimal residual cavity or fistulous communication with descending colon. Drain was flushed and reattached to bag. IMPRESSION: Abscessogram demonstrates minimal residual abscess cavity with fistulous communication with bowel. PLAN: 1. Patient again instructed to not flush the drain. 2. Drain remains attached to bag consider bulb. 3. Return in 10-14 days for abscessogram only. Electronically Signed   By: Acquanetta Belling M.D.   On: 11/14/2022 16:50    Labs:  CBC: Recent Labs    10/03/22 0353  10/04/22 0404 10/05/22 0403 10/06/22 0419  WBC 11.0* 11.9* 10.8* 10.2  HGB 11.5* 11.8* 11.3* 12.1*  HCT 36.5* 37.5* 36.1* 38.6*  PLT 334 370 373 408*    COAGS: No results for input(s): "INR", "APTT" in the last 8760 hours.  BMP: Recent Labs    10/03/22 0353 10/04/22 0404 10/05/22 0403 10/06/22 0419  NA 133* 134* 133* 133*  K 3.1* 4.1 4.0 4.2  CL 96* 97* 96* 96*  CO2 28 27 29 29   GLUCOSE 128* 106* 174* 103*  BUN 19 17 17 17   CALCIUM 8.2* 8.4* 8.5* 8.7*  CREATININE 1.41* 1.45* 1.46* 1.45*  GFRNONAA 57* 55* 55* 55*    LIVER FUNCTION TESTS: Recent Labs    06/20/22 0409 07/08/22 1123 08/02/22 1608 10/01/22 0829  BILITOT 1.2 0.5 0.8 1.0  AST 13* 32 19 13*  ALT 15 29 18 10   ALKPHOS 50 70 79 56  PROT 6.3* 6.7 7.2 7.1  ALBUMIN 2.9* 3.2* 3.9 2.9*    TUMOR MARKERS: No results for input(s): "AFPTM", "CEA", "CA199", "CHROMGRNA" in the last 8760 hours.  Assessment and Plan:  Slight decrease in diverticular abscess cavity size, with persistent fistula. We will maintain drain patency with low volume saline flushes BID. We will have wound/ostomy care consult for skin breakdown. We will see back in 2-3 weeks for reinjection.  Hopefully we can avoid need for surgery here.  Thank you for this interesting consult.  I greatly enjoyed meeting Bradley Torres and look forward to participating in their care.  A copy of this report was sent to the requesting provider on this date.  Electronically Signed: Durwin Glaze 12/12/2022, 2:23 PM   I spent a total of    25 Minutes in face to face in clinical consultation, greater than 50% of which was counseling/coordinating care for abscess with fistula.

## 2022-12-13 ENCOUNTER — Other Ambulatory Visit: Payer: Self-pay | Admitting: General Surgery

## 2022-12-13 DIAGNOSIS — K5720 Diverticulitis of large intestine with perforation and abscess without bleeding: Secondary | ICD-10-CM

## 2022-12-21 ENCOUNTER — Encounter: Payer: Self-pay | Admitting: Family Medicine

## 2022-12-21 ENCOUNTER — Telehealth (INDEPENDENT_AMBULATORY_CARE_PROVIDER_SITE_OTHER): Payer: Medicaid Other | Admitting: Family Medicine

## 2022-12-21 DIAGNOSIS — L02415 Cutaneous abscess of right lower limb: Secondary | ICD-10-CM | POA: Diagnosis not present

## 2022-12-21 MED ORDER — DOXYCYCLINE HYCLATE 100 MG PO TABS
100.0000 mg | ORAL_TABLET | Freq: Two times a day (BID) | ORAL | 0 refills | Status: AC
Start: 2022-12-21 — End: 2022-12-31

## 2022-12-21 NOTE — Progress Notes (Signed)
Virtual Visit via Video   I connected with patient on 12/21/22 at 1430 by a video enabled telemedicine application and verified that I am speaking with the correct person using two identifiers.  Location patient: Home Location provider: [facility] Persons participating in the virtual visit: Patient and Provider  I discussed the limitations of evaluation and management by telemedicine and the availability of in person appointments. The patient expressed understanding and agreed to proceed.  Subjective:   HPI:  Bradley Torres presents today for  Chief Complaint  Patient presents with   Abscess   Bradley Torres reports a red, raised, tender, and firm nodule to right upper leg. Onset a few days ago.   Abscess This is a new problem. The current episode started in the past 7 days. The problem has been gradually worsening. Pertinent negatives include no abdominal pain, anorexia, arthralgias, change in bowel habit, chest pain, chills, congestion, coughing, diaphoresis, fatigue, fever, headaches, joint swelling, myalgias, nausea, neck pain, numbness, rash, sore throat, swollen glands, urinary symptoms, vertigo, visual change, vomiting or weakness. He has tried nothing for the symptoms. The treatment provided no relief.     Review of Systems  Constitutional:  Negative for chills, diaphoresis, fatigue and fever.  HENT:  Negative for congestion and sore throat.   Respiratory:  Negative for cough.   Cardiovascular:  Negative for chest pain.  Gastrointestinal:  Negative for abdominal pain, anorexia, change in bowel habit, nausea and vomiting.  Musculoskeletal:  Negative for arthralgias, joint swelling, myalgias and neck pain.  Skin:  Negative for rash.       Red firm lesion, tenderness. History of same  Neurological:  Negative for vertigo, weakness, numbness and headaches.     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   History of pulmonary embolus (PE) 10/24/2020   Paroxysmal atrial fibrillation (HCC) 12/31/2019   Vitamin D deficiency    Type 2 diabetes mellitus with hyperglycemia, without long-term current use of insulin (HCC)    Sleep related hypoxia 11/01/2019   Severe obstructive sleep apnea-hypopnea syndrome 08/01/2019   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   Arteriovenous malformation 08/01/2019   Primary osteoarthritis involving multiple joints 06/21/2019   Dysphagia 10/17/2018   Anticoagulated by anticoagulation treatment 03/06/2018   Hx of non-ST elevation myocardial infarction (NSTEMI) 03/06/2018   Chronic diastolic CHF (congestive heart failure) (HCC)    Chronic pain syndrome    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   CKD (chronic kidney disease) stage 3, GFR 30-59 ml/min (HCC) 01/20/2018   Morbid (severe) obesity due to excess calories (HCC) 01/20/2018   Acute kidney injury superimposed on chronic kidney disease (HCC) 01/19/2018   Disorder of left rotator cuff 11/01/2017   Hx of adenomatous colonic polyps 07/26/2017   Hypertension associated with diabetes (HCC) 01/23/2014   Chronic bilateral back pain 01/23/2014    Social History   Tobacco Use   Smoking status: Never    Passive exposure: Never   Smokeless tobacco: Never  Substance Use Topics   Alcohol use: Yes  Comment: occ    Current Outpatient Medications:    doxycycline (VIBRA-TABS) 100 MG tablet, Take 1 tablet (100 mg total) by mouth 2 (two) times daily for 10 days. 1 po bid, Disp: 20  tablet, Rfl: 0   Accu-Chek Softclix Lancets lancets, Use as instructed to monitor glucose twice daily, Disp: 100 each, Rfl: 12   allopurinol (ZYLOPRIM) 100 MG tablet, Take 1 tablet (100 mg total) by mouth daily., Disp: 90 tablet, Rfl: 3   amiodarone (PACERONE) 200 MG tablet, Take 200 mg by mouth daily., Disp: , Rfl:    atorvastatin (LIPITOR) 20 MG tablet, TAKE ONE TABLET BY MOUTH ONCE DAILY, Disp: 90 tablet, Rfl: 3   chlorthalidone (HYGROTON) 25 MG tablet, Take 25 mg by mouth every other day., Disp: , Rfl:    Cholecalciferol (VITAMIN D-3 PO), Take 1 tablet by mouth daily., Disp: , Rfl:    dapagliflozin propanediol (FARXIGA) 10 MG TABS tablet, TAKE ONE TABLET BY MOUTH DAILY BEFORE BREAKFAST, Disp: 90 tablet, Rfl: 3   docusate sodium (COLACE) 100 MG capsule, Take 1 capsule (100 mg total) by mouth 2 (two) times daily. (Patient taking differently: Take 100 mg by mouth 2 (two) times daily as needed (constipation.).), Disp: 60 capsule, Rfl: 0   glucose blood (ACCU-CHEK GUIDE) test strip, Use as instructed to monitor glucose twice daily, Disp: 100 each, Rfl: 12   hydrALAZINE (APRESOLINE) 25 MG tablet, Take 1 tablet (25 mg total) by mouth daily., Disp: 90 tablet, Rfl: 2   hydrALAZINE (APRESOLINE) 50 MG tablet, Take 50 mg by mouth daily., Disp: , Rfl:    metFORMIN (GLUCOPHAGE-XR) 750 MG 24 hr tablet, Take 2 tablets (1,500 mg total) by mouth daily with breakfast. (Patient taking differently: Take 750 mg by mouth daily with breakfast.), Disp: 180 tablet, Rfl: 1   oxyCODONE-acetaminophen (PERCOCET) 10-325 MG tablet, Take 1 tablet by mouth 3 (three) times daily as needed for pain. (Patient taking differently: Take 1 tablet by mouth every 6 (six) hours as needed for pain.), Disp: 30 tablet, Rfl: 0   polyethylene glycol (MIRALAX / GLYCOLAX) 17 g packet, Take 17 g by mouth daily. (Patient taking differently: Take 17 g by mouth daily as needed (constipation.).), Disp: 30 each, Rfl: 0   Prasterone, DHEA, (DHEA PO),  Take 1 capsule by mouth daily., Disp: , Rfl:    rivaroxaban (XARELTO) 20 MG TABS tablet, TAKE ONE TABLET BY MOUTH DAILY WITH SUPPER (Patient taking differently: Take 20 mg by mouth daily with supper.), Disp: 90 tablet, Rfl: 3   senna-docusate (SENOKOT-S) 8.6-50 MG tablet, Take 2 tablets by mouth at bedtime. (Patient taking differently: Take 2 tablets by mouth at bedtime as needed (constipation).), Disp: 120 tablet, Rfl: 0   sitaGLIPtin (JANUVIA) 100 MG tablet, Take 1 tablet (100 mg total) by mouth daily., Disp: 90 tablet, Rfl: 3   testosterone cypionate (DEPOTESTOSTERONE CYPIONATE) 200 MG/ML injection, Inject 200 mg into the muscle every 14 (fourteen) days., Disp: , Rfl:    valsartan (DIOVAN) 160 MG tablet, Take 160 mg by mouth daily., Disp: , Rfl:   Allergies  Allergen Reactions   Actos [Pioglitazone] Other (See Comments) and Cough    Dyspnea  Edema   Zestril [Lisinopril] Other (See Comments) and Cough    Wheezing    Aldactone [Spironolactone] Hives   Bystolic [Nebivolol Hcl] Swelling   Motrin [Ibuprofen] Other (See Comments)    Rectal bleed   Trulicity [Dulaglutide] Other (See Comments) and Cough    Shortness of breath GI intolerance  Cozaar [Losartan] Rash   Diflucan [Fluconazole] Rash   Hctz [Hydrochlorothiazide] Other (See Comments)    Gout flares.   Norvasc [Amlodipine] Itching and Other (See Comments)    Myalgias     Objective:   There were no vitals taken for this visit.  Patient is well-developed, well-nourished in no acute distress.  Resting comfortably at home.  Head is normocephalic, atraumatic.  No labored breathing.  Speech is clear and coherent with logical content.  Patient is alert and oriented at baseline.    Assessment and Plan:   Keyur was seen today for abscess.  Diagnoses and all orders for this visit:  Cutaneous abscess of right lower extremity Abscess to upper thigh. Unable to take Bactrim due to amiodarone. Will treat with doxycycline. Bradley Torres  aware of symptomatic care at home and when to be seen in office. Report new, worsening, or persistent symptoms.  -     doxycycline (VIBRA-TABS) 100 MG tablet; Take 1 tablet (100 mg total) by mouth 2 (two) times daily for 10 days. 1 po bid      Return if symptoms worsen or fail to improve.  Kari Baars, [name]-C [facility] [address] [place], [place] [address] [phone]  12/21/2022  Time spent with the patient: 15 minutes, of which >50% was spent in obtaining information about symptoms, reviewing previous labs, evaluations, and treatments, counseling about condition (please see the discussed topics above), and developing a plan to further investigate it; had a number of questions which I addressed.

## 2022-12-23 ENCOUNTER — Encounter: Payer: Self-pay | Admitting: Nurse Practitioner

## 2022-12-23 ENCOUNTER — Ambulatory Visit: Payer: Medicaid Other | Attending: Cardiology | Admitting: Nurse Practitioner

## 2022-12-23 VITALS — BP 114/72 | HR 82 | Ht 72.0 in | Wt 335.8 lb

## 2022-12-23 DIAGNOSIS — I272 Pulmonary hypertension, unspecified: Secondary | ICD-10-CM | POA: Diagnosis not present

## 2022-12-23 DIAGNOSIS — Z86711 Personal history of pulmonary embolism: Secondary | ICD-10-CM | POA: Diagnosis not present

## 2022-12-23 DIAGNOSIS — Z86718 Personal history of other venous thrombosis and embolism: Secondary | ICD-10-CM

## 2022-12-23 DIAGNOSIS — I2510 Atherosclerotic heart disease of native coronary artery without angina pectoris: Secondary | ICD-10-CM | POA: Diagnosis not present

## 2022-12-23 DIAGNOSIS — I10 Essential (primary) hypertension: Secondary | ICD-10-CM | POA: Diagnosis not present

## 2022-12-23 DIAGNOSIS — Z7901 Long term (current) use of anticoagulants: Secondary | ICD-10-CM | POA: Diagnosis not present

## 2022-12-23 DIAGNOSIS — R42 Dizziness and giddiness: Secondary | ICD-10-CM | POA: Diagnosis not present

## 2022-12-23 DIAGNOSIS — R0609 Other forms of dyspnea: Secondary | ICD-10-CM | POA: Diagnosis not present

## 2022-12-23 DIAGNOSIS — I480 Paroxysmal atrial fibrillation: Secondary | ICD-10-CM

## 2022-12-23 DIAGNOSIS — D6859 Other primary thrombophilia: Secondary | ICD-10-CM | POA: Diagnosis not present

## 2022-12-23 DIAGNOSIS — Z8719 Personal history of other diseases of the digestive system: Secondary | ICD-10-CM

## 2022-12-23 DIAGNOSIS — G4733 Obstructive sleep apnea (adult) (pediatric): Secondary | ICD-10-CM | POA: Diagnosis not present

## 2022-12-23 DIAGNOSIS — Z8673 Personal history of transient ischemic attack (TIA), and cerebral infarction without residual deficits: Secondary | ICD-10-CM

## 2022-12-23 NOTE — Patient Instructions (Signed)
Medication Instructions:  Continue all current medications.  Labwork: none  Testing/Procedures: none  Follow-Up: 6 months   Any Other Special Instructions Will Be Listed Below (If Applicable).  If you need a refill on your cardiac medications before your next appointment, please call your pharmacy.  

## 2022-12-23 NOTE — Progress Notes (Unsigned)
Office Visit    Patient Name: Bradley Torres Date of Encounter: 12/23/2022 PCP:  Mechele Claude, MD [facility]  Cardiologist:  Dina Rich, MD  Advanced Practice Provider:  No care team member to display Electrophysiologist:  None   Chief Complaint and HPI    Bradley Torres is a 62 y.o. male with a hx of CAD, history of chest pain, palpitations, hypertension, pulmonary hypertension/history of DVT, history of PE, hx of CVA, OSA, and history of PAF, who presents today for scheduled follow-up.  Last seen by Dr. Dina Rich on April 28, 2022.  Blood pressure was at goal.  Was doing well from a cardiac perspective.  Was tolerating Xarelto well.  Admitted 05/2022 due to abdominal pain, treated for severe sepsis secondary to perforated sigmoid diverticulitis, went into A-fib with RVR during hospitalization, received IV amiodarone and continue to have high heart rate. Converted to oral amiodarone, converted to SR.  TTE showed normal systolic function, no significant valvular disease.  Cardiology recommended amiodarone 200 mg twice daily x 3 weeks, followed by 200 mg once daily.  09/08/2022 - Seen for follow-up. Returned to taking Amiodarone as he has had a few more episodes of A-fib. Chief concern was orthostatic dizziness. Denied any chest pain, worsening shortness of breath, recent palpitations, syncope, presyncope, orthopnea, PND, swelling or significant weight changes, acute bleeding, or claudication. Hydralazine dosage reduced.  Admitted 09/2022 for LLQ abdominal pain, fever, had large left ventral peritoneal abscess potentially communicate with descending colon consistent with abscess from perforated diverticulitis without pneumatosis.  General surgery consulted.  IR performed CT-guided drain placement on July 6. Follows Dr. Lovell Sheehan.   12/23/2022 - Doing well from a cardiac standpoint. Denies any chest pain, shortness of breath, palpitations, syncope,  presyncope, orthopnea, PND, swelling or significant weight changes, acute bleeding, or claudication. Tolerating his medications well. Dizziness has improved from last office visit, believes this is d/t his stroke.   EKGs/Labs/Other Studies Reviewed:   The following studies were reviewed today:   EKG:  EKG is not ordered today.    Echo 05/2022: 1. Left ventricular ejection fraction, by estimation, is 50 to 55% -  difficuly evaluation in the setting of rapid atrial fibrillation and  variable contraction from beat to beat. There is intermittent septal  bounce in association with respiration. The  left ventricle has low normal function. The left ventricle demonstrates  global hypokinesis. Left ventricular diastolic parameters are  indeterminate.   2. Right ventricular systolic function is moderately reduced. The right  ventricular size is mildly enlarged. There is mildly elevated pulmonary  artery systolic pressure. The estimated right ventricular systolic  pressure is 42.2 mmHg.   3. Left atrial size was mildly dilated.   4. The mitral valve is grossly normal. Trivial mitral valve  regurgitation.   5. The aortic valve is tricuspid. Aortic valve regurgitation is not  visualized.   6. The inferior vena cava is dilated in size with <50% respiratory  variability, suggesting right atrial pressure of 15 mmHg.   Comparison(s): Prior images reviewed side by side. Difficult to compare  since patient currently in rapid atrial fibrillation. Consider follow-up  limited study once back in sinus rhythm.  Left heart cath 12/2017: Prox LAD lesion is 10% stenosed. The left ventricular ejection fraction is 50-55% by visual estimate. LV end diastolic pressure is mildly elevated. The left ventricular systolic function is normal.   No significant coronary obstructive disease with essentially normal coronary arteries and only  mild luminal irregularity of the proximal LAD of 10%; normal left circumflex  and normal dominant RCA.   Low normal global LV function with an ejection fraction of 50 to 55% without definitive focal segmental wall motion abnormalities.  LVEDP is 18 mmHg.   RECOMMENDATION: Medical therapy.  Weight loss is essential.  The patient should be evaluated for obstructive sleep apnea.   No indication for antiplatelet therapy at this time.  Risk Assessment/Calculations:   CHA2DS2-VASc Score = 4  This indicates a 4.8% annual risk of stroke. The patient's score is based upon: CHF History: 0 HTN History: 1 Diabetes History: 0 Stroke History: 2 Vascular Disease History: 1 Age Score: 0 Gender Score: 0  Review of Systems    All other systems reviewed and are otherwise negative except as noted above.  Physical Exam    VS:  BP 114/72   Pulse 82   Ht 6' (1.829 m)   Wt (!) 335 lb 12.8 oz (152.3 kg)   SpO2 93%   BMI 45.54 kg/m  , BMI Body mass index is 45.54 kg/m.  Wt Readings from Last 3 Encounters:  12/23/22 (!) 335 lb 12.8 oz (152.3 kg)  11/01/22 (!) 330 lb (149.7 kg)  10/13/22 (!) 330 lb (149.7 kg)     GEN: Morbidly obese, 62 y.o. male in no acute distress. HEENT: normal. Neck: Supple, no JVD, carotid bruits, or masses. Cardiac: S1/S2, RRR, no murmurs, rubs, or gallops. No clubbing, cyanosis. Nonpitting edema to BLE. Radials2+ /PT 1+ and equal bilaterally.  Respiratory:  Respirations regular and unlabored, clear to auscultation bilaterally. MS: No deformity or atrophy. Skin: Warm and dry, no rash. Neuro:  Strength and sensation are intact. Psych: Normal affect.  Assessment & Plan    PAF, long term anticoagulation, hypercoagulable state (history of DVT/PE) Denies any recent tachycardia or palpitations. Tolerating Amiodarone well. HR well controlled. Continue Amiodarone and Xarelto, on appropriate dosage and denies any bleeding issues. Has not obtained thyroid panel from last office visit, previously ordered and requested this to be obtained as past TSH in  05/2022 was mildly elevated. Past LFT WNL. I have requested these labs are obtained and faxed to our office. Pt politely declines any changes to his medication management at this time. Heart healthy diet and regular cardiovascular exercise encouraged.   CAD Stable with no anginal symptoms. No indication for ischemic evaluation. Continue current medication regimen. Heart healthy diet and regular cardiovascular exercise encouraged. ED precautions discussed.   HTN Blood pressure stable. Given BP log and discussed to monitor BP at home at least 2 hours after medications and sitting for 5-10 minutes. Continue current medication regimen. Heart healthy diet and regular cardiovascular exercise encouraged.   DOE, pulmonary hypertension, OSA  Etiology multifactorial. Pulmonary HTN is most likely d/t OSA not treated with CPAP machine. Previously referred to Pulmonology. Continue to follow with PCP.  Hx of CVA, orthostatic dizziness Stable improved and chronic orthostatic dizziness. Denies any new or worsening symptoms. Not on aspirin d/t being on xarelto. Continue current medication regimen. Heart healthy diet and regular cardiovascular exercise encouraged. Continue to follow with PCP.  6. Hx of perforated diverticulitis Doing well and recovering well. Still has JP drain. Follow-up with treatment teat as scheduled. Continue to follow with PCP.  7. Morbid obesity Weight loss via diet and exercise encouraged. Discussed the impact being overweight would have on cardiovascular risk.  Disposition: Follow up with Dina Rich, MD or APP in 6 months or sooner if anything changes.  Signed, Sharlene Dory, NP

## 2022-12-28 ENCOUNTER — Other Ambulatory Visit: Payer: Self-pay | Admitting: Physician Assistant

## 2022-12-28 MED ORDER — SODIUM CHLORIDE FLUSH 0.9 % IV SOLN: INTRAVENOUS | 3 refills | Status: DC

## 2023-01-02 ENCOUNTER — Other Ambulatory Visit: Payer: Medicaid Other

## 2023-01-02 NOTE — Progress Notes (Signed)
Referring Physician(s): Franky Macho  Chief Complaint: The patient is seen in follow up today s/p diverticular abscess drain placement 10/01/22  History of present illness: Bradley Torres, 62 year old male, has a medical history significant for CAD/MI, DM, DVT, HTN, obesity, panic disorder, stroke, renal insufficiency and diverticulosis. He was hospitalized March 2024 for perforated diverticulitis without abscess. He presented to the ED for evaluation of LLQ abdominal pain and was found to have a large left peritoneal abscess. Imaging showed a potential communication with the descending colon. IR was consulted for drain placement and he received a 12 Fr LLQ drain 10/01/22. He was discharged home and followed up at our outpatient clinic 10/26/22. 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 The drain was switched from a suction bulb to a gravity bag and the patient discontinued drain flushes.   He has followed up at the clinic two additional times - 11/14/22 and 12/12/22. 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 At his last visit he was reporting a daily output of approximately 100 ml of purulent fluid. He was instructed to resume low volume saline flushes BID. Exam of the drain site showed erythema and skin breakdown inferior to the drain site and he was referred for a Wound/Ostomy evaluation.  He presents today for a repeat drain evaluation.   Past Medical History:  Diagnosis Date   CAD (coronary artery disease)    a. 12/2017: cath showing 10% Proximal-LAD stenosis with no significant obstructive disease and LVEDP mildly elevated at 18 mm Hg.    Chronic back pain    Diabetes mellitus, type 2 (HCC)    DVT (deep venous thrombosis) (HCC)    Essential hypertension    Family hx of colon cancer 05/03/2017   MI (myocardial infarction) (HCC) 2019   Morbid obesity (HCC)    Panic  disorder    Shoulder pain, left    Following fall   Sleep apnea    Stroke (HCC) 01/2018   Vitamin D deficiency     Past Surgical History:  Procedure Laterality Date   Ankle fracture sugery Left    BIOPSY  07/20/2020   Procedure: BIOPSY;  Surgeon: Corbin Ade, MD;  Location: [facility];  Service: Endoscopy;;  cecal polyp   COLONOSCOPY WITH PROPOFOL N/A 05/29/2017   Dr. Jena Gauss: Multiple tubular adenomas removed, prep inadequate.  Short interval surveillance colonoscopy recommended in 6 months   COLONOSCOPY WITH PROPOFOL N/A 07/20/2020   Procedure: COLONOSCOPY WITH PROPOFOL;  Surgeon: Corbin Ade, MD;  Location: [facility];  Service: Endoscopy;  Laterality: N/A;  am appt, early as possible since diabetic   INTERVENTIONAL RADIOLOGY PROCEDURE  10/08/2022   JP drain placement due to diverticular abscess   IR RADIOLOGIST EVAL & MGMT  10/26/2022   LEFT HEART CATH AND CORONARY ANGIOGRAPHY N/A 01/23/2018   Procedure: LEFT HEART CATH AND CORONARY ANGIOGRAPHY;  Surgeon: Lennette Bihari, MD;  Location: [facility];  Service: Cardiovascular;  Laterality: N/A;   LUMBAR SPINE SURGERY     fusion   POLYPECTOMY  05/29/2017   Procedure: POLYPECTOMY;  Surgeon: Corbin Ade, MD;  Location: [facility];  Service: Endoscopy;;  ascending colon polyp x5-cs transverse colon polyp x4- cs descending colon polyp x1- hs sigmoid colon polyp x1 -hs rectal polyp x1- hs   TONSILLECTOMY      Allergies: Actos [pioglitazone], Zestril [lisinopril],  Aldactone [spironolactone], Bystolic [nebivolol hcl], Motrin [ibuprofen], Trulicity [dulaglutide], Cozaar [losartan], Diflucan [fluconazole], Hctz [hydrochlorothiazide], and Norvasc [amlodipine]  Medications: Prior to Admission medications   Medication Sig Start Date End Date Taking? Authorizing Provider  Accu-Chek Softclix Lancets lancets Use as instructed to monitor glucose twice daily 09/23/21   Dani Gobble, NP  allopurinol (ZYLOPRIM) 100  MG tablet Take 1 tablet (100 mg total) by mouth daily. 02/14/22   Mechele Claude, MD  amiodarone (PACERONE) 200 MG tablet Take 200 mg by mouth daily.    [provider]  atorvastatin (LIPITOR) 20 MG tablet TAKE ONE TABLET BY MOUTH ONCE DAILY 08/01/22   Antoine Poche, MD  chlorthalidone (HYGROTON) 25 MG tablet Take 25 mg by mouth every other day.    [provider]  Cholecalciferol (VITAMIN D-3 PO) Take 1 tablet by mouth daily.    [provider]  dapagliflozin propanediol (FARXIGA) 10 MG TABS tablet TAKE ONE TABLET BY MOUTH DAILY BEFORE BREAKFAST 10/28/21   Dani Gobble, NP  docusate sodium (COLACE) 100 MG capsule Take 1 capsule (100 mg total) by mouth 2 (two) times daily. Patient taking differently: Take 100 mg by mouth 2 (two) times daily as needed (constipation.). 06/28/22   Osvaldo Shipper, MD  glucose blood (ACCU-CHEK GUIDE) test strip Use as instructed to monitor glucose twice daily 10/28/21   Dani Gobble, NP  hydrALAZINE (APRESOLINE) 25 MG tablet Take 1 tablet (25 mg total) by mouth daily. 09/08/22   Sharlene Dory, NP  hydrALAZINE (APRESOLINE) 50 MG tablet Take 50 mg by mouth daily.    [provider]  metFORMIN (GLUCOPHAGE-XR) 750 MG 24 hr tablet Take 2 tablets (1,500 mg total) by mouth daily with breakfast. Patient taking differently: Take 750 mg by mouth daily with breakfast. 05/11/22   Mechele Claude, MD  oxyCODONE-acetaminophen (PERCOCET) 10-325 MG tablet Take 1 tablet by mouth 3 (three) times daily as needed for pain. Patient taking differently: Take 1 tablet by mouth every 6 (six) hours as needed for pain. 06/28/22   Osvaldo Shipper, MD  polyethylene glycol (MIRALAX / GLYCOLAX) 17 g packet Take 17 g by mouth daily. Patient taking differently: Take 17 g by mouth daily as needed (constipation.). 06/28/22   Osvaldo Shipper, MD  Prasterone, DHEA, (DHEA PO) Take 1 capsule by mouth daily.    [provider]  rivaroxaban (XARELTO) 20 MG TABS  tablet TAKE ONE TABLET BY MOUTH DAILY WITH SUPPER Patient taking differently: Take 20 mg by mouth daily with supper. 02/14/22   Mechele Claude, MD  senna-docusate (SENOKOT-S) 8.6-50 MG tablet Take 2 tablets by mouth at bedtime. Patient taking differently: Take 2 tablets by mouth at bedtime as needed (constipation). 06/28/22   Osvaldo Shipper, MD  sitaGLIPtin (JANUVIA) 100 MG tablet Take 1 tablet (100 mg total) by mouth daily. 10/28/21   Dani Gobble, NP  sodium chloride flush 0.9 % SOLN injection Flush your drain with 3-5 mL of 0.9% normal every day 12/28/22   Lynnette Caffey A, PA-C  testosterone cypionate (DEPOTESTOSTERONE CYPIONATE) 200 MG/ML injection Inject 200 mg into the muscle every 14 (fourteen) days. 07/30/21   [provider]  valsartan (DIOVAN) 160 MG tablet Take 160 mg by mouth daily.    [provider]     Family History  Problem Relation Age of Onset   Colon cancer Mother        died at age 21   Heart attack Father    Hepatitis Sister 56  Autoimmune   Heart attack Paternal Grandfather     Social History   Socioeconomic History   Marital status: Single    Spouse name: Not on file   Number of children: Not on file   Years of education: Not on file   Highest education level: Not on file  Occupational History   Occupation: disabled  Tobacco Use   Smoking status: Never    Passive exposure: Never   Smokeless tobacco: Never  Vaping Use   Vaping status: Never Used  Substance and Sexual Activity   Alcohol use: Yes    Comment: occ   Drug use: Not Currently    Types: Marijuana    Comment: daily; 05/19/20 no marijuana in past 6 months d/t going to pain management clinic   Sexual activity: Not Currently    Birth control/protection: None  Other Topics Concern   Not on file  Social History Narrative   Not on file   Social Determinants of Health   Financial Resource Strain: Medium Risk (05/18/2020)   Overall Financial Resource Strain (CARDIA)     Difficulty of Paying Living Expenses: Somewhat hard  Food Insecurity: No Food Insecurity (10/01/2022)   Hunger Vital Sign    Worried About Running Out of Food in the Last Year: Never true    Ran Out of Food in the Last Year: Never true  Transportation Needs: No Transportation Needs (10/01/2022)   PRAPARE - Administrator, Civil Service (Medical): No    Lack of Transportation (Non-Medical): No  Physical Activity: Not on file  Stress: Stress Concern Present (05/15/2019)   [employer] of Occupational Health - Occupational Stress Questionnaire    Feeling of Stress : Rather much  Social Connections: Unknown (01/16/2020)   Social Connection and Isolation Panel [NHANES]    Frequency of Communication with Friends and Family: More than three times a week    Frequency of Social Gatherings with Friends and Family: More than three times a week    Attends Religious Services: Not on Marketing executive or Organizations: Not on file    Attends Banker Meetings: Not on file    Marital Status: Not on file     Vital Signs: There were no vitals taken for this visit.  Physical Exam  Imaging: No results found.  Labs:  CBC: Recent Labs    10/03/22 0353 10/04/22 0404 10/05/22 0403 10/06/22 0419  WBC 11.0* 11.9* 10.8* 10.2  HGB 11.5* 11.8* 11.3* 12.1*  HCT 36.5* 37.5* 36.1* 38.6*  PLT 334 370 373 408*    COAGS: No results for input(s): "INR", "APTT" in the last 8760 hours.  BMP: Recent Labs    10/03/22 0353 10/04/22 0404 10/05/22 0403 10/06/22 0419  NA 133* 134* 133* 133*  K 3.1* 4.1 4.0 4.2  CL 96* 97* 96* 96*  CO2 28 27 29 29   GLUCOSE 128* 106* 174* 103*  BUN 19 17 17 17   CALCIUM 8.2* 8.4* 8.5* 8.7*  CREATININE 1.41* 1.45* 1.46* 1.45*  GFRNONAA 57* 55* 55* 55*    LIVER FUNCTION TESTS: Recent Labs    06/20/22 0409 07/08/22 1123 08/02/22 1608 10/01/22 0829  BILITOT 1.2 0.5 0.8 1.0  AST 13* 32 19 13*  ALT 15 29 18 10    ALKPHOS 50 70 79 56  PROT 6.3* 6.7 7.2 7.1  ALBUMIN 2.9* 3.2* 3.9 2.9*    Assessment and Plan:  62 year old male with a history of perforated diverticulitis with  abscess and 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   Electronically Signed: Mickie Kay 01/02/2023, 4:53 PM   I spent a total of 25 Minutes in face to face in clinical consultation, greater than 50% of which was counseling/coordinating care for diverticular abscess drain.

## 2023-01-03 ENCOUNTER — Other Ambulatory Visit: Payer: Self-pay | Admitting: "Endocrinology

## 2023-01-03 DIAGNOSIS — I10 Essential (primary) hypertension: Secondary | ICD-10-CM | POA: Diagnosis not present

## 2023-01-03 DIAGNOSIS — E1165 Type 2 diabetes mellitus with hyperglycemia: Secondary | ICD-10-CM

## 2023-01-03 DIAGNOSIS — Z79899 Other long term (current) drug therapy: Secondary | ICD-10-CM | POA: Diagnosis not present

## 2023-01-03 DIAGNOSIS — M539 Dorsopathy, unspecified: Secondary | ICD-10-CM | POA: Diagnosis not present

## 2023-01-03 DIAGNOSIS — N1831 Chronic kidney disease, stage 3a: Secondary | ICD-10-CM

## 2023-01-03 DIAGNOSIS — R030 Elevated blood-pressure reading, without diagnosis of hypertension: Secondary | ICD-10-CM | POA: Diagnosis not present

## 2023-01-03 DIAGNOSIS — Z6841 Body Mass Index (BMI) 40.0 and over, adult: Secondary | ICD-10-CM | POA: Diagnosis not present

## 2023-01-03 DIAGNOSIS — E291 Testicular hypofunction: Secondary | ICD-10-CM | POA: Diagnosis not present

## 2023-01-04 ENCOUNTER — Telehealth (HOSPITAL_COMMUNITY): Payer: Self-pay

## 2023-01-04 ENCOUNTER — Other Ambulatory Visit: Payer: Self-pay | Admitting: General Surgery

## 2023-01-04 ENCOUNTER — Encounter: Payer: Self-pay | Admitting: Interventional Radiology

## 2023-01-04 ENCOUNTER — Ambulatory Visit
Admission: RE | Admit: 2023-01-04 | Discharge: 2023-01-04 | Disposition: A | Discharge status: Home / Self Care | Payer: Medicaid Other | Source: Ambulatory Visit | Attending: General Surgery | Admitting: General Surgery

## 2023-01-04 DIAGNOSIS — K5720 Diverticulitis of large intestine with perforation and abscess without bleeding: Secondary | ICD-10-CM

## 2023-01-04 HISTORY — PX: IR RADIOLOGIST EVAL & MGMT: IMG5224

## 2023-01-12 ENCOUNTER — Telehealth: Payer: Self-pay | Admitting: Cardiology

## 2023-01-12 MED ORDER — AMIODARONE HCL 200 MG PO TABS: 200.0000 mg | ORAL_TABLET | Freq: Every day | ORAL | 1 refills | Status: DC

## 2023-01-12 NOTE — Telephone Encounter (Signed)
*  STAT* If patient is at the pharmacy, call can be transferred to refill team.   1. Which medications need to be refilled? (please list name of each medication and dose if known)   Patient states he does not know the name of the medication of have the bottle but it is for afib.  2. Which pharmacy/location (including street and city if local pharmacy) is medication to be sent to?  [facility] [phone] - [place], [place] - [address] AT [facility] [address]  3. Do they need a 30 day or 90 day supply?  90 day supply

## 2023-01-12 NOTE — Telephone Encounter (Signed)
Amiodarone filled

## 2023-01-12 NOTE — Telephone Encounter (Signed)
Patient did not answer mailbox is full

## 2023-01-12 NOTE — Addendum Note (Signed)
Addended by: Sharen Hones on: 01/12/2023 01:27 PM   Modules accepted: Orders

## 2023-01-16 ENCOUNTER — Ambulatory Visit (HOSPITAL_COMMUNITY)
Admission: RE | Admit: 2023-01-16 | Discharge: 2023-01-16 | Disposition: A | Discharge status: Home / Self Care | Payer: Medicaid Other | Source: Ambulatory Visit | Attending: General Surgery | Admitting: General Surgery

## 2023-01-16 DIAGNOSIS — K5780 Diverticulitis of intestine, part unspecified, with perforation and abscess without bleeding: Secondary | ICD-10-CM | POA: Diagnosis not present

## 2023-01-16 DIAGNOSIS — K5720 Diverticulitis of large intestine with perforation and abscess without bleeding: Secondary | ICD-10-CM | POA: Diagnosis not present

## 2023-01-16 DIAGNOSIS — Z4682 Encounter for fitting and adjustment of non-vascular catheter: Secondary | ICD-10-CM | POA: Diagnosis not present

## 2023-01-16 DIAGNOSIS — Z4803 Encounter for change or removal of drains: Secondary | ICD-10-CM | POA: Diagnosis not present

## 2023-01-16 HISTORY — PX: IR CATHETER TUBE CHANGE: IMG717

## 2023-01-16 MED ORDER — IOHEXOL 300 MG/ML  SOLN
50.0000 mL | Freq: Once | INTRAMUSCULAR | Status: AC | PRN
  Administered 2023-01-16: 10 mL

## 2023-01-16 MED ORDER — LIDOCAINE HCL 1 % IJ SOLN
INTRAMUSCULAR | Status: AC
  Filled 2023-01-16: qty 20

## 2023-01-17 ENCOUNTER — Other Ambulatory Visit: Payer: Self-pay | Admitting: "Endocrinology

## 2023-01-17 ENCOUNTER — Other Ambulatory Visit (HOSPITAL_COMMUNITY): Payer: Self-pay | Admitting: Radiology

## 2023-01-17 DIAGNOSIS — K5720 Diverticulitis of large intestine with perforation and abscess without bleeding: Secondary | ICD-10-CM

## 2023-01-18 ENCOUNTER — Other Ambulatory Visit: Payer: Self-pay | Admitting: General Surgery

## 2023-01-18 DIAGNOSIS — K5720 Diverticulitis of large intestine with perforation and abscess without bleeding: Secondary | ICD-10-CM

## 2023-01-19 ENCOUNTER — Other Ambulatory Visit: Payer: Self-pay | Admitting: Cardiology

## 2023-01-30 ENCOUNTER — Other Ambulatory Visit: Payer: Medicaid Other

## 2023-01-30 ENCOUNTER — Ambulatory Visit
Admission: RE | Admit: 2023-01-30 | Discharge: 2023-01-30 | Disposition: A | Discharge status: Home / Self Care | Payer: Medicaid Other | Source: Ambulatory Visit | Attending: Radiology | Admitting: Radiology

## 2023-01-30 ENCOUNTER — Ambulatory Visit
Admission: RE | Admit: 2023-01-30 | Discharge: 2023-01-30 | Disposition: A | Discharge status: Home / Self Care | Payer: Medicaid Other | Source: Ambulatory Visit | Attending: General Surgery | Admitting: General Surgery

## 2023-01-30 DIAGNOSIS — K5720 Diverticulitis of large intestine with perforation and abscess without bleeding: Secondary | ICD-10-CM

## 2023-01-30 HISTORY — PX: IR RADIOLOGIST EVAL & MGMT: IMG5224

## 2023-01-30 NOTE — Progress Notes (Signed)
Patient ID: Bradley Torres, male   DOB: 04-Jul-1960, 62 y.o.   MRN: [id number]        Chief Complaint:  Diverticular abscess drain follow-up   Referring Physician(s): Dr. Franky Macho  History of Present Illness: Bradley Torres is a 62 y.o. male with an extensive medical history as below which was recently complicated by acute diverticulitis with abscess.  Drain placement was performed 10/01/2022.  He has been followed in clinic every 2 to 3 weeks.  CT on 10/26/2022 demonstrated resolution of the abscess and stable drain catheter position.  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  Most recently, the drain catheter was exchanged on 01/16/2023.  He returns again for outpatient follow-up and evaluation.  He reports little if any output.  Drain has remained to gravity without flushing.  However, the minimal drainage in the bag is fecal contaminated.  He remains afebrile.  Stable weight and appetite.  No recent illness.  Past Medical History:  Diagnosis Date   CAD (coronary artery disease)    a. 12/2017: cath showing 10% Proximal-LAD stenosis with no significant obstructive disease and LVEDP mildly elevated at 18 mm Hg.    Chronic back pain    Diabetes mellitus, type 2 (HCC)    DVT (deep venous thrombosis) (HCC)    Essential hypertension    Family hx of colon cancer 05/03/2017   MI (myocardial infarction) (HCC) 2019   Morbid obesity (HCC)    Panic disorder    Shoulder pain, left    Following fall   Sleep apnea    Stroke (HCC) 01/2018   Vitamin D deficiency     Past Surgical History:  Procedure Laterality Date   Ankle fracture sugery Left    BIOPSY  07/20/2020   Procedure: BIOPSY;  Surgeon: Corbin Ade, MD;  Location: [facility];  Service: Endoscopy;;  cecal polyp   COLONOSCOPY WITH PROPOFOL N/A 05/29/2017   Dr. Jena Gauss: Multiple tubular adenomas removed, prep inadequate.  Short interval surveillance  colonoscopy recommended in 6 months   COLONOSCOPY WITH PROPOFOL N/A 07/20/2020   Procedure: COLONOSCOPY WITH PROPOFOL;  Surgeon: Corbin Ade, MD;  Location: [facility];  Service: Endoscopy;  Laterality: N/A;  am appt, early as possible since diabetic   INTERVENTIONAL RADIOLOGY PROCEDURE  10/08/2022   JP drain placement due to diverticular abscess   IR CATHETER TUBE CHANGE  01/16/2023   IR RADIOLOGIST EVAL & MGMT  10/26/2022   IR RADIOLOGIST EVAL & MGMT  01/04/2023   LEFT HEART CATH AND CORONARY ANGIOGRAPHY N/A 01/23/2018   Procedure: LEFT HEART CATH AND CORONARY ANGIOGRAPHY;  Surgeon: Lennette Bihari, MD;  Location: [facility];  Service: Cardiovascular;  Laterality: N/A;   LUMBAR SPINE SURGERY     fusion   POLYPECTOMY  05/29/2017   Procedure: POLYPECTOMY;  Surgeon: Corbin Ade, MD;  Location: [facility];  Service: Endoscopy;;  ascending colon polyp x5-cs transverse colon polyp x4- cs descending colon polyp x1- hs sigmoid colon polyp x1 -hs rectal polyp x1- hs   TONSILLECTOMY      Allergies: Actos [pioglitazone], Zestril [lisinopril], Aldactone [spironolactone], Bystolic [nebivolol hcl], Motrin [ibuprofen], Trulicity [dulaglutide], Cozaar [losartan], Diflucan [fluconazole], Hctz [hydrochlorothiazide], and Norvasc [amlodipine]  Medications: Prior to Admission medications   Medication Sig Start Date End Date Taking? Authorizing Provider  Accu-Chek Softclix Lancets lancets Use as instructed to monitor glucose twice daily 09/23/21   Dani Gobble,  NP  allopurinol (ZYLOPRIM) 100 MG tablet Take 1 tablet (100 mg total) by mouth daily. 02/14/22   Mechele Claude, MD  amiodarone (PACERONE) 200 MG tablet Take 1 tablet (200 mg total) by mouth daily. 01/12/23   Antoine Poche, MD  atorvastatin (LIPITOR) 20 MG tablet TAKE ONE TABLET BY MOUTH ONCE DAILY 08/01/22   Antoine Poche, MD  chlorthalidone (HYGROTON) 25 MG tablet TAKE 1 TABLET BY MOUTH EVERY OTHER DAY 01/20/23    Sharlene Dory, NP  Cholecalciferol (VITAMIN D-3 PO) Take 1 tablet by mouth daily.    [provider]  dapagliflozin propanediol (FARXIGA) 10 MG TABS tablet TAKE ONE TABLET BY MOUTH DAILY BEFORE BREAKFAST 10/28/21   Dani Gobble, NP  docusate sodium (COLACE) 100 MG capsule Take 1 capsule (100 mg total) by mouth 2 (two) times daily. Patient taking differently: Take 100 mg by mouth 2 (two) times daily as needed (constipation.). 06/28/22   Osvaldo Shipper, MD  glucose blood (ACCU-CHEK GUIDE) test strip Use as instructed to monitor glucose twice daily 10/28/21   Dani Gobble, NP  hydrALAZINE (APRESOLINE) 25 MG tablet Take 1 tablet (25 mg total) by mouth daily. 09/08/22   Sharlene Dory, NP  hydrALAZINE (APRESOLINE) 50 MG tablet Take 50 mg by mouth daily.    [provider]  metFORMIN (GLUCOPHAGE-XR) 750 MG 24 hr tablet Take 2 tablets (1,500 mg total) by mouth daily with breakfast. Patient taking differently: Take 750 mg by mouth daily with breakfast. 05/11/22   Mechele Claude, MD  oxyCODONE-acetaminophen (PERCOCET) 10-325 MG tablet Take 1 tablet by mouth 3 (three) times daily as needed for pain. Patient taking differently: Take 1 tablet by mouth every 6 (six) hours as needed for pain. 06/28/22   Osvaldo Shipper, MD  polyethylene glycol (MIRALAX / GLYCOLAX) 17 g packet Take 17 g by mouth daily. Patient taking differently: Take 17 g by mouth daily as needed (constipation.). 06/28/22   Osvaldo Shipper, MD  Prasterone, DHEA, (DHEA PO) Take 1 capsule by mouth daily.    [provider]  rivaroxaban (XARELTO) 20 MG TABS tablet TAKE ONE TABLET BY MOUTH DAILY WITH SUPPER Patient taking differently: Take 20 mg by mouth daily with supper. 02/14/22   Mechele Claude, MD  senna-docusate (SENOKOT-S) 8.6-50 MG tablet Take 2 tablets by mouth at bedtime. Patient taking differently: Take 2 tablets by mouth at bedtime as needed (constipation). 06/28/22   Osvaldo Shipper, MD  sitaGLIPtin  (JANUVIA) 100 MG tablet Take 1 tablet (100 mg total) by mouth daily. 10/28/21   Dani Gobble, NP  sodium chloride flush 0.9 % SOLN injection Flush your drain with 3-5 mL of 0.9% normal every day 12/28/22   Lynnette Caffey A, PA-C  testosterone cypionate (DEPOTESTOSTERONE CYPIONATE) 200 MG/ML injection Inject 200 mg into the muscle every 14 (fourteen) days. 07/30/21   [provider]  valsartan (DIOVAN) 160 MG tablet Take 160 mg by mouth daily.    [provider]     Family History  Problem Relation Age of Onset   Colon cancer Mother        died at age 26   Heart attack Father    Hepatitis Sister 59       Autoimmune   Heart attack Paternal Grandfather     Social History   Socioeconomic History   Marital status: Single    Spouse name: Not on file   Number of children: Not on file   Years of education: Not on file  Highest education level: Not on file  Occupational History   Occupation: disabled  Tobacco Use   Smoking status: Never    Passive exposure: Never   Smokeless tobacco: Never  Vaping Use   Vaping status: Never Used  Substance and Sexual Activity   Alcohol use: Yes    Comment: occ   Drug use: Not Currently    Types: Marijuana    Comment: daily; 05/19/20 no marijuana in past 6 months d/t going to pain management clinic   Sexual activity: Not Currently    Birth control/protection: None  Other Topics Concern   Not on file  Social History Narrative   Not on file   Social Determinants of Health   Financial Resource Strain: Medium Risk (05/18/2020)   Overall Financial Resource Strain (CARDIA)    Difficulty of Paying Living Expenses: Somewhat hard  Food Insecurity: No Food Insecurity (10/01/2022)   Hunger Vital Sign    Worried About Running Out of Food in the Last Year: Never true    Ran Out of Food in the Last Year: Never true  Transportation Needs: No Transportation Needs (10/01/2022)   PRAPARE - Administrator, Civil Service  (Medical): No    Lack of Transportation (Non-Medical): No  Physical Activity: Not on file  Stress: Stress Concern Present (05/15/2019)   [employer] of Occupational Health - Occupational Stress Questionnaire    Feeling of Stress : Rather much  Social Connections: Unknown (01/16/2020)   Social Connection and Isolation Panel [NHANES]    Frequency of Communication with Friends and Family: More than three times a week    Frequency of Social Gatherings with Friends and Family: More than three times a week    Attends Religious Services: Not on Marketing executive or Organizations: Not on file    Attends Banker Meetings: Not on file    Marital Status: Not on file       Review of Systems: A 12 point ROS discussed and pertinent positives are indicated in the HPI above.  All other systems are negative.  Review of Systems  Vital Signs: BP (!) 157/86   Pulse 72   Temp 97.9 F (36.6 C)   SpO2 96%     Physical Exam Constitutional:      Appearance: He is obese. He is not ill-appearing or diaphoretic.  Eyes:     General: No scleral icterus.    Conjunctiva/sclera: Conjunctivae normal.  Abdominal:     Tenderness: There is no abdominal tenderness. There is no guarding.     Comments: Drain catheter site is clean, dry and intact.  Neurological:     General: No focal deficit present.     Mental Status: Mental status is at baseline.  Psychiatric:        Mood and Affect: Mood normal.        Thought Content: Thought content normal.           Imaging: IR Catheter Tube Change  Result Date: 01/16/2023 CLINICAL DATA:  62 year old male. History of diverticular abscess status post percutaneous drain placement on 10/01/2022. 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 Now resolved. Patient presents for drain exchange EXAM: IR CATHETER TUBE CHANGE COMPARISON:  None Available. CONTRAST:  10 ml - administered via the percutaneous drainage catheter.  MEDICATIONS: Lidocaine 1% 2 mL ANESTHESIA/SEDATION: None FLUOROSCOPY TIME:  55 mGy TECHNIQUE: Patient was positioned supine on the fluoroscopy table. The external portion of the  existing percutaneous drainage catheter as well as the surrounding skin was prepped and draped in usual sterile fashion. A preprocedural spot fluoroscopic image was obtained of the existing percutaneous drainage catheter. A small amount of contrast was injected via the existing percutaneous drainage catheter and several fluoroscopic images were obtained in various obliquities. The external portion of the percutaneous drainage catheter was cut and cannulated with a short Amplatz wire. Under intermittent fluoroscopic guidance, the existing percutaneous drainage catheter was exchanged for a new 12 FrSkater percutaneous drainage catheter with end coiled and locked within the decompressed abscess cavity. Contrast injection confirmed appropriate position functionality of the percutaneous drainage catheter. The percutaneous drainage catheter was connected to a gravity bag and secured in place within interrupted suture and a StatLock device. A dressing was applied. The patient tolerated the procedure well without immediate postprocedural complication. FINDINGS: Persistent but improving residual abscess collection with no appreciable fistula. Procedure performed by Anders Grant, IR NP and supervised by Roanna Banning, MD IMPRESSION: Successful exchange and repositioning for a new 12 Fr abscess drainage catheter for the left lower quadrant abscess PLAN: Arrange for abscessogram and IR clinic in the next 1-2 weeks. Patient understands to discontinue flushing and keep it to drainage Roanna Banning, MD [facility] Radiology Electronically Signed   By: Roanna Banning M.D.   On: 01/16/2023 15:56   DG Sinus/Fist Tube Chk-Non GI  Result Date: 01/04/2023 CLINICAL DATA:  62 year old male with history of  diverticular abscess status post percutaneous drain placement on 10/01/2022 complicated by colonic fistula. EXAM: ABSCESS INJECTION COMPARISON:  12/12/2022 CONTRAST:  15 mL Omnipaque 300-administered via the existing percutaneous drain. FLUOROSCOPY TIME:  27.4 mGy TECHNIQUE: The patient was positioned supine on the fluoroscopy table. A preprocedural spot fluoroscopic image was obtained of the left lower quadrant and the existing percutaneous drainage catheter. Multiple spot fluoroscopic and radiographic images were obtained following the injection of a small amount of contrast via the existing percutaneous drainage catheter. FINDINGS: Unchanged position of indwelling drain. Persistent fluid collection in the left lower quadrant, appearing slightly larger than comparison. No evidence of colonic fistula. IMPRESSION: Resolution of previously visualized colonic fistula. Persistent left lower quadrant fluid collection with unchanged drainage position. PLAN: Arrange for drain exchange in the next 1-2 weeks. Discontinue flushing. Keep to bag drainage. Marliss Coots, MD [facility] Radiology Electronically Signed   By: Marliss Coots M.D.   On: 01/04/2023 13:44   IR Radiologist Eval & Mgmt  Result Date: 01/04/2023 EXAM: ESTABLISHED PATIENT OFFICE VISIT CHIEF COMPLAINT: See Epic note. HISTORY OF PRESENT ILLNESS: See Epic note. REVIEW OF SYSTEMS: See Epic note. PHYSICAL EXAMINATION: See Epic note. ASSESSMENT AND PLAN: See Epic note. Marliss Coots, MD [facility] Radiology Electronically Signed   By: Marliss Coots M.D.   On: 01/04/2023 13:42    Labs:  CBC: Recent Labs    10/03/22 0353 10/04/22 0404 10/05/22 0403 10/06/22 0419  WBC 11.0* 11.9* 10.8* 10.2  HGB 11.5* 11.8* 11.3* 12.1*  HCT 36.5* 37.5* 36.1* 38.6*  PLT 334 370 373 408*    COAGS: No results for input(s): "INR", "APTT" in the last 8760  hours.  BMP: Recent Labs    10/03/22 0353 10/04/22 0404 10/05/22 0403 10/06/22 0419  NA 133* 134* 133* 133*  K 3.1* 4.1 4.0 4.2  CL 96* 97* 96* 96*  CO2 28 27 29 29   GLUCOSE 128* 106* 174* 103*  BUN 19 17 17  17  CALCIUM 8.2* 8.4* 8.5* 8.7*  CREATININE 1.41* 1.45* 1.46* 1.45*  GFRNONAA 57* 55* 55* 55*    LIVER FUNCTION TESTS: Recent Labs    06/20/22 0409 07/08/22 1123 08/02/22 1608 10/01/22 0829  BILITOT 1.2 0.5 0.8 1.0  AST 13* 32 19 13*  ALT 15 29 18 10   ALKPHOS 50 70 79 56  PROT 6.3* 6.7 7.2 7.1  ALBUMIN 2.9* 3.2* 3.9 2.9*     Assessment and Plan:  Complicated perforated diverticulitis with abscess status post percutaneous drainage originally placed 10/01/2022.  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  Although there is minimal output, the output within the bag is fecal contaminated.  Drain remains to gravity without flushing.  Plan: Continue external gravity drainage without flushing.  He was instructed to call Dr. Lovell Sheehan office for surgical follow-up as soon as possible to consider surgical intervention at this point since he has a persistent fistula for nearly 4 months.  We can continue drain injection every 2 to 3 weeks to assess for fistula healing if needed.   Electronically Signed: Berdine Dance 01/30/2023, 1:54 PM   I spent a total of    25 Minutes in face to face in clinical consultation, greater than 50% of which was counseling/coordinating care for This patient with a persistent diverticular abscess and adjacent colonic fistula

## 2023-02-01 ENCOUNTER — Other Ambulatory Visit: Payer: Self-pay | Admitting: General Surgery

## 2023-02-01 DIAGNOSIS — K5720 Diverticulitis of large intestine with perforation and abscess without bleeding: Secondary | ICD-10-CM

## 2023-02-02 ENCOUNTER — Ambulatory Visit: Payer: Medicaid Other | Admitting: Family Medicine

## 2023-02-02 DIAGNOSIS — E1165 Type 2 diabetes mellitus with hyperglycemia: Secondary | ICD-10-CM | POA: Diagnosis not present

## 2023-02-02 DIAGNOSIS — M539 Dorsopathy, unspecified: Secondary | ICD-10-CM | POA: Diagnosis not present

## 2023-02-02 DIAGNOSIS — E291 Testicular hypofunction: Secondary | ICD-10-CM | POA: Diagnosis not present

## 2023-02-02 DIAGNOSIS — I10 Essential (primary) hypertension: Secondary | ICD-10-CM | POA: Diagnosis not present

## 2023-02-02 DIAGNOSIS — Z6841 Body Mass Index (BMI) 40.0 and over, adult: Secondary | ICD-10-CM | POA: Diagnosis not present

## 2023-02-02 DIAGNOSIS — R030 Elevated blood-pressure reading, without diagnosis of hypertension: Secondary | ICD-10-CM | POA: Diagnosis not present

## 2023-02-02 DIAGNOSIS — Z79899 Other long term (current) drug therapy: Secondary | ICD-10-CM | POA: Diagnosis not present

## 2023-02-06 DIAGNOSIS — Z79899 Other long term (current) drug therapy: Secondary | ICD-10-CM | POA: Diagnosis not present

## 2023-02-07 ENCOUNTER — Encounter: Payer: Self-pay | Admitting: General Surgery

## 2023-02-07 ENCOUNTER — Ambulatory Visit: Payer: Medicaid Other | Admitting: General Surgery

## 2023-02-07 VITALS — BP 164/92 | HR 71 | Temp 97.8°F | Resp 18 | Ht 72.0 in | Wt 337.0 lb

## 2023-02-07 DIAGNOSIS — K5732 Diverticulitis of large intestine without perforation or abscess without bleeding: Secondary | ICD-10-CM

## 2023-02-07 NOTE — Progress Notes (Signed)
Subjective:     Bradley Torres  Patient is here for follow-up, status post percutaneous drainage by IR for sigmoid diverticular abscess.  He last had a drain evaluation on 01/30/2023.  The abscess cavity has resolved but there appears to be 2 fistulous tracts emanating from the colon.  Patient states since that time, he has not had any significant drainage from the catheter.  No stool has been present.  He denies any fever or chills.  His bowel movements have been regular.  He is still very resistant to having any surgical intervention. Objective:    BP (!) 164/92   Pulse 71   Temp 97.8 F (36.6 C) (Oral)   Resp 18   Ht 6' (1.829 m)   Wt (!) 337 lb (152.9 kg)   SpO2 94%   BMI 45.71 kg/m   General:  alert, cooperative, and no distress  Abdomen is soft, nontender, nondistended.  Drain bag without any significant fluid present.     Assessment:    Sigmoid diverticulitis with intra-abdominal abscess, now with fistulous track to the colon but no abscess cavity present.    Plan:   No need for acute surgical intervention at the present time.  Patient is at increased risk for surgical intervention given his comorbidities.  Hopefully the fistulas will close on their own.  He has another appointment with interventional radiology on 02/17/2023.  Further management is pending that follow-up.

## 2023-02-15 NOTE — Progress Notes (Signed)
Referring Physician(s): Franky Macho  Chief Complaint: The patient is seen in follow up today s/p diverticular abscess drain 10/01/22  History of present illness: HPI from last clinic visit 01/04/23 Bradley Torres, 62 year old male, has a medical history significant for CAD/MI, DM, DVT, HTN, obesity, panic disorder, stroke, renal insufficiency and diverticulosis. He was hospitalized March 2024 for perforated diverticulitis without abscess. He presented to the ED for evaluation of LLQ abdominal pain and was found to have a large left peritoneal abscess. Imaging showed a potential communication with the descending colon. IR was consulted for drain placement and he received a 12 Fr LLQ drain 10/01/22. He was discharged home and followed up at our outpatient clinic 10/26/22. 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 The drain was switched from a suction bulb to a gravity bag and the patient discontinued drain flushes.   He has followed up at the clinic two additional times - 11/14/22 and 12/12/22. 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 At his last visit he was reporting a daily output of approximately 100 ml of purulent fluid. He was instructed to resume low volume saline flushes BID. Exam of the drain site showed erythema and skin breakdown inferior to the drain site and he was referred for a Wound/Ostomy evaluation.   He presents today for a repeat drain evaluation.   He has not been able to see the wound clinic as they were reportedly backed up until after December.  He states that the skin site is significantly improved.  He reports significantly less drainage in the bag, about 25-50 mL per day.  He states that the output is somewhat purulent and sees blood in the bag occasionally.  He has remained afebrile but does endorse some recent chills.  No new abdominal pain.  He continues  to flush the drain once per day.   At his clinic visit 01/04/23 the drain was injected under fluoroscopy and this demonstrated resolution of the previously identified colonic fistula. Drain injection also showed a similar if not enlarged collection near the pigtail portion. He was instructed to discontinue flushing the drain and the drain was exchanged/upsized to a 12 Fr on 01/16/23. 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 He was instructed to continue external gravity drainage without flushing. He was also instructed to call Dr. Lovell Sheehan' office for surgical follow up since he has had a persistent fistula for 4 months. The patient met with Dr. Lovell Sheehan 02/07/23 and he expressed resistance to having any surgical intervention. Furthermore, the patient is not an ideal surgical candidate given his co-morbidities. Dr. Lovell Sheehan is hopeful the fistula will heal on its own.   The patient presents today to the IR outpatient clinic for a repeat drain evaluation. He has had very little output from the drain. He does not flush the drain.  No new abdominal pain, fevers, or chills.  Past Medical History:  Diagnosis Date   CAD (coronary artery disease)    a. 12/2017: cath showing 10% Proximal-LAD stenosis with no significant obstructive disease and LVEDP mildly elevated at 18 mm Hg.    Chronic back pain    Diabetes mellitus, type 2 (HCC)    DVT (deep venous thrombosis) (HCC)    Essential hypertension    Family hx of colon cancer 05/03/2017  MI (myocardial infarction) (HCC) 2019   Morbid obesity (HCC)    Panic disorder    Shoulder pain, left    Following fall   Sleep apnea    Stroke (HCC) 01/2018   Vitamin D deficiency     Past Surgical History:  Procedure Laterality Date   Ankle fracture sugery Left    BIOPSY  07/20/2020   Procedure: BIOPSY;  Surgeon: Corbin Ade, MD;  Location: [facility];  Service: Endoscopy;;  cecal polyp   COLONOSCOPY WITH PROPOFOL N/A 05/29/2017   Dr. Jena Gauss: Multiple tubular adenomas removed, prep inadequate.  Short interval surveillance colonoscopy recommended in 6 months   COLONOSCOPY WITH PROPOFOL N/A 07/20/2020   Procedure: COLONOSCOPY WITH PROPOFOL;  Surgeon: Corbin Ade, MD;  Location: [facility];  Service: Endoscopy;  Laterality: N/A;  am appt, early as possible since diabetic   INTERVENTIONAL RADIOLOGY PROCEDURE  10/08/2022   JP drain placement due to diverticular abscess   IR CATHETER TUBE CHANGE  01/16/2023   IR RADIOLOGIST EVAL & MGMT  10/26/2022   IR RADIOLOGIST EVAL & MGMT  01/04/2023   IR RADIOLOGIST EVAL & MGMT  01/30/2023   LEFT HEART CATH AND CORONARY ANGIOGRAPHY N/A 01/23/2018   Procedure: LEFT HEART CATH AND CORONARY ANGIOGRAPHY;  Surgeon: Lennette Bihari, MD;  Location: [facility];  Service: Cardiovascular;  Laterality: N/A;   LUMBAR SPINE SURGERY     fusion   POLYPECTOMY  05/29/2017   Procedure: POLYPECTOMY;  Surgeon: Corbin Ade, MD;  Location: [facility];  Service: Endoscopy;;  ascending colon polyp x5-cs transverse colon polyp x4- cs descending colon polyp x1- hs sigmoid colon polyp x1 -hs rectal polyp x1- hs   TONSILLECTOMY      Allergies: Actos [pioglitazone], Zestril [lisinopril], Aldactone [spironolactone], Bystolic [nebivolol hcl], Motrin [ibuprofen], Trulicity [dulaglutide], Cozaar [losartan], Diflucan [fluconazole], Hctz [hydrochlorothiazide], and Norvasc [amlodipine]  Medications: Prior to Admission medications   Medication Sig Start Date End Date Taking? Authorizing Provider  Accu-Chek Softclix Lancets lancets Use as instructed to monitor glucose twice daily 09/23/21   Dani Gobble, NP  allopurinol (ZYLOPRIM) 100 MG tablet Take 1 tablet (100 mg total) by mouth daily. 02/14/22   Mechele Claude, MD  amiodarone (PACERONE) 200 MG tablet Take 1 tablet (200 mg total) by mouth daily. 01/12/23    Antoine Poche, MD  atorvastatin (LIPITOR) 20 MG tablet TAKE ONE TABLET BY MOUTH ONCE DAILY 08/01/22   Antoine Poche, MD  chlorthalidone (HYGROTON) 25 MG tablet TAKE 1 TABLET BY MOUTH EVERY OTHER DAY 01/20/23   Sharlene Dory, NP  Cholecalciferol (VITAMIN D-3 PO) Take 1 tablet by mouth daily.    [provider]  dapagliflozin propanediol (FARXIGA) 10 MG TABS tablet TAKE ONE TABLET BY MOUTH DAILY BEFORE BREAKFAST 10/28/21   Dani Gobble, NP  docusate sodium (COLACE) 100 MG capsule Take 1 capsule (100 mg total) by mouth 2 (two) times daily. Patient taking differently: Take 100 mg by mouth 2 (two) times daily as needed (constipation.). 06/28/22   Osvaldo Shipper, MD  glucose blood (ACCU-CHEK GUIDE) test strip Use as instructed to monitor glucose twice daily 10/28/21   Dani Gobble, NP  hydrALAZINE (APRESOLINE) 25 MG tablet Take 1 tablet (25 mg total) by mouth daily. 09/08/22   Sharlene Dory, NP  hydrALAZINE (APRESOLINE) 50 MG tablet Take 50 mg by mouth daily.    [provider]  metFORMIN (GLUCOPHAGE-XR) 750 MG 24 hr tablet Take 2 tablets (1,500  mg total) by mouth daily with breakfast. Patient taking differently: Take 750 mg by mouth daily with breakfast. 05/11/22   Mechele Claude, MD  oxyCODONE-acetaminophen (PERCOCET) 10-325 MG tablet Take 1 tablet by mouth 3 (three) times daily as needed for pain. Patient taking differently: Take 1 tablet by mouth every 6 (six) hours as needed for pain. 06/28/22   Osvaldo Shipper, MD  polyethylene glycol (MIRALAX / GLYCOLAX) 17 g packet Take 17 g by mouth daily. Patient taking differently: Take 17 g by mouth daily as needed (constipation.). 06/28/22   Osvaldo Shipper, MD  Prasterone, DHEA, (DHEA PO) Take 1 capsule by mouth daily.    [provider]  rivaroxaban (XARELTO) 20 MG TABS tablet TAKE ONE TABLET BY MOUTH DAILY WITH SUPPER Patient taking differently: Take 20 mg by mouth daily with supper. 02/14/22   Mechele Claude, MD   senna-docusate (SENOKOT-S) 8.6-50 MG tablet Take 2 tablets by mouth at bedtime. Patient taking differently: Take 2 tablets by mouth at bedtime as needed (constipation). 06/28/22   Osvaldo Shipper, MD  sitaGLIPtin (JANUVIA) 100 MG tablet Take 1 tablet (100 mg total) by mouth daily. 10/28/21   Dani Gobble, NP  sodium chloride flush 0.9 % SOLN injection Flush your drain with 3-5 mL of 0.9% normal every day 12/28/22   Lynnette Caffey A, PA-C  testosterone cypionate (DEPOTESTOSTERONE CYPIONATE) 200 MG/ML injection Inject 200 mg into the muscle every 14 (fourteen) days. 07/30/21   [provider]  valsartan (DIOVAN) 160 MG tablet Take 160 mg by mouth daily.    [provider]     Family History  Problem Relation Age of Onset   Colon cancer Mother        died at age 83   Heart attack Father    Hepatitis Sister 28       Autoimmune   Heart attack Paternal Grandfather     Social History   Socioeconomic History   Marital status: Single    Spouse name: Not on file   Number of children: Not on file   Years of education: Not on file   Highest education level: Not on file  Occupational History   Occupation: disabled  Tobacco Use   Smoking status: Never    Passive exposure: Never   Smokeless tobacco: Never  Vaping Use   Vaping status: Never Used  Substance and Sexual Activity   Alcohol use: Yes    Comment: occ   Drug use: Not Currently    Types: Marijuana    Comment: daily; 05/19/20 no marijuana in past 6 months d/t going to pain management clinic   Sexual activity: Not Currently    Birth control/protection: None  Other Topics Concern   Not on file  Social History Narrative   Not on file   Social Determinants of Health   Financial Resource Strain: Medium Risk (05/18/2020)   Overall Financial Resource Strain (CARDIA)    Difficulty of Paying Living Expenses: Somewhat hard  Food Insecurity: No Food Insecurity (10/01/2022)   Hunger Vital Sign    Worried About  Running Out of Food in the Last Year: Never true    Ran Out of Food in the Last Year: Never true  Transportation Needs: No Transportation Needs (10/01/2022)   PRAPARE - Administrator, Civil Service (Medical): No    Lack of Transportation (Non-Medical): No  Physical Activity: Not on file  Stress: Stress Concern Present (05/15/2019)   [employer] of Occupational Health - Occupational Stress  Questionnaire    Feeling of Stress : Rather much  Social Connections: Unknown (01/16/2020)   Social Connection and Isolation Panel [NHANES]    Frequency of Communication with Friends and Family: More than three times a week    Frequency of Social Gatherings with Friends and Family: More than three times a week    Attends Religious Services: Not on Marketing executive or Organizations: Not on file    Attends Banker Meetings: Not on file    Marital Status: Not on file     Vital Signs: There were no vitals taken for this visit.  Physical Exam Constitutional:      General: He is not in acute distress. HENT:     Head: Normocephalic.     Mouth/Throat:     Mouth: Mucous membranes are moist.  Eyes:     General: No scleral icterus. Cardiovascular:     Rate and Rhythm: Normal rate.  Pulmonary:     Effort: Pulmonary effort is normal.  Abdominal:     General: There is no distension.     Comments: RLQ drain in place, clean dressing.  Trace light tan fluid in bag.  Skin:    General: Skin is warm and dry.  Neurological:     Mental Status: He is alert and oriented to person, place, and time.     Imaging: Drain check 01/04/23  No evidence of persistent colonic fistula.  Persistent fluid collection.  Drain check (02/17/23) After forceful injection, there is spillage of the contrast material inferiorly, lacking bowel signature or peristalsis.  Labs:  CBC: Recent Labs    10/03/22 0353 10/04/22 0404 10/05/22 0403 10/06/22 0419  WBC 11.0* 11.9* 10.8*  10.2  HGB 11.5* 11.8* 11.3* 12.1*  HCT 36.5* 37.5* 36.1* 38.6*  PLT 334 370 373 408*    COAGS: No results for input(s): "INR", "APTT" in the last 8760 hours.  BMP: Recent Labs    10/03/22 0353 10/04/22 0404 10/05/22 0403 10/06/22 0419  NA 133* 134* 133* 133*  K 3.1* 4.1 4.0 4.2  CL 96* 97* 96* 96*  CO2 28 27 29 29   GLUCOSE 128* 106* 174* 103*  BUN 19 17 17 17   CALCIUM 8.2* 8.4* 8.5* 8.7*  CREATININE 1.41* 1.45* 1.46* 1.45*  GFRNONAA 57* 55* 55* 55*    LIVER FUNCTION TESTS: Recent Labs    06/20/22 0409 07/08/22 1123 08/02/22 1608 10/01/22 0829  BILITOT 1.2 0.5 0.8 1.0  AST 13* 32 19 13*  ALT 15 29 18 10   ALKPHOS 50 70 79 56  PROT 6.3* 6.7 7.2 7.1  ALBUMIN 2.9* 3.2* 3.9 2.9*    Assessment and Plan: 62 year old male with a history of perforated sigmoid diverticulitis with abscess and percutaneous drain initially placed 10/01/22. 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  He has had nearly no output from the drain.  Drain injection today does not reveal enteric fistulization.  The drain was removed successfully.  Follow up with IR as needed.  Bradley Coots, MD Pager: [phone]    I spent a total of 25 Minutes in face to face in clinical consultation, greater than 50% of which was counseling/coordinating care for diverticular abscess drain.

## 2023-02-17 ENCOUNTER — Ambulatory Visit
Admission: RE | Admit: 2023-02-17 | Discharge: 2023-02-17 | Disposition: A | Discharge status: Home / Self Care | Payer: Medicaid Other | Source: Ambulatory Visit | Attending: General Surgery | Admitting: General Surgery

## 2023-02-17 DIAGNOSIS — K5720 Diverticulitis of large intestine with perforation and abscess without bleeding: Secondary | ICD-10-CM | POA: Diagnosis not present

## 2023-02-17 HISTORY — PX: IR RADIOLOGIST EVAL & MGMT: IMG5224

## 2023-02-21 ENCOUNTER — Ambulatory Visit: Payer: Medicaid Other | Admitting: Family Medicine

## 2023-02-21 ENCOUNTER — Encounter: Payer: Self-pay | Admitting: Family Medicine

## 2023-02-21 VITALS — BP 154/83 | HR 80 | Temp 98.3°F | Ht 72.0 in | Wt 347.6 lb

## 2023-02-21 DIAGNOSIS — Z7984 Long term (current) use of oral hypoglycemic drugs: Secondary | ICD-10-CM | POA: Diagnosis not present

## 2023-02-21 DIAGNOSIS — E1165 Type 2 diabetes mellitus with hyperglycemia: Secondary | ICD-10-CM | POA: Diagnosis not present

## 2023-02-21 DIAGNOSIS — E785 Hyperlipidemia, unspecified: Secondary | ICD-10-CM | POA: Diagnosis not present

## 2023-02-21 DIAGNOSIS — I152 Hypertension secondary to endocrine disorders: Secondary | ICD-10-CM

## 2023-02-21 DIAGNOSIS — Z23 Encounter for immunization: Secondary | ICD-10-CM | POA: Diagnosis not present

## 2023-02-21 DIAGNOSIS — E1159 Type 2 diabetes mellitus with other circulatory complications: Secondary | ICD-10-CM

## 2023-02-21 DIAGNOSIS — E1169 Type 2 diabetes mellitus with other specified complication: Secondary | ICD-10-CM | POA: Diagnosis not present

## 2023-02-21 LAB — BAYER DCA HB A1C WAIVED: HB A1C (BAYER DCA - WAIVED): 6.1 % — ABNORMAL HIGH (ref 4.8–5.6)

## 2023-02-21 NOTE — Progress Notes (Signed)
Subjective:  Patient ID: Bradley Torres, male    DOB: November 10, 1960  Age: 62 y.o. MRN: [id number]  CC: Medical Management of Chronic Issues   HPI Bradley Torres presents forFollow-up of diabetes. Not checking glucose.Patient denies symptoms such as polyuria, polydipsia, excessive hunger, nausea No significant hypoglycemic spells noted. Medications reviewed. Pt reports taking them regularly without complication/adverse reaction being reported today.    History Bradley Torres has a past medical history of CAD (coronary artery disease), Chronic back pain, Diabetes mellitus, type 2 (HCC), DVT (deep venous thrombosis) (HCC), Essential hypertension, Family hx of colon cancer (05/03/2017), MI (myocardial infarction) (HCC) (2019), Morbid obesity (HCC), Panic disorder, Shoulder pain, left, Sleep apnea, Stroke (HCC) (01/2018), and Vitamin D deficiency.   He has a past surgical history that includes Lumbar spine surgery; Ankle fracture sugery (Left); Tonsillectomy; Colonoscopy with propofol (N/A, 05/29/2017); polypectomy (05/29/2017); LEFT HEART CATH AND CORONARY ANGIOGRAPHY (N/A, 01/23/2018); Colonoscopy with propofol (N/A, 07/20/2020); biopsy (07/20/2020); Interventional radiology procedure (10/08/2022); IR Radiologist Eval & Mgmt (10/26/2022); IR Radiologist Eval & Mgmt (01/04/2023); IR Catheter Tube Change (01/16/2023); IR Radiologist Eval & Mgmt (01/30/2023); and IR Radiologist Eval & Mgmt (02/17/2023).   His family history includes Colon cancer in his mother; Heart attack in his father and paternal grandfather; Hepatitis (age of onset: 84) in his sister.He reports that he has never smoked. He has never been exposed to tobacco smoke. He has never used smokeless tobacco. He reports current alcohol use. He reports that he does not currently use drugs after having used the following drugs: Marijuana.  Current Outpatient Medications on File Prior to Visit  Medication Sig Dispense Refill   Accu-Chek Softclix Lancets  lancets Use as instructed to monitor glucose twice daily 100 each 12   allopurinol (ZYLOPRIM) 100 MG tablet Take 1 tablet (100 mg total) by mouth daily. 90 tablet 3   amiodarone (PACERONE) 200 MG tablet Take 1 tablet (200 mg total) by mouth daily. 90 tablet 1   atorvastatin (LIPITOR) 20 MG tablet TAKE ONE TABLET BY MOUTH ONCE DAILY 90 tablet 3   chlorthalidone (HYGROTON) 25 MG tablet TAKE 1 TABLET BY MOUTH EVERY OTHER DAY 45 tablet 1   Cholecalciferol (VITAMIN D-3 PO) Take 1 tablet by mouth daily.     dapagliflozin propanediol (FARXIGA) 10 MG TABS tablet TAKE ONE TABLET BY MOUTH DAILY BEFORE BREAKFAST 90 tablet 3   docusate sodium (COLACE) 100 MG capsule Take 1 capsule (100 mg total) by mouth 2 (two) times daily. (Patient taking differently: Take 100 mg by mouth 2 (two) times daily as needed (constipation.).) 60 capsule 0   glucose blood (ACCU-CHEK GUIDE) test strip Use as instructed to monitor glucose twice daily 100 each 12   hydrALAZINE (APRESOLINE) 25 MG tablet Take 1 tablet (25 mg total) by mouth daily. 90 tablet 2   hydrALAZINE (APRESOLINE) 50 MG tablet Take 50 mg by mouth daily.     metFORMIN (GLUCOPHAGE-XR) 750 MG 24 hr tablet Take 2 tablets (1,500 mg total) by mouth daily with breakfast. (Patient taking differently: Take 750 mg by mouth daily with breakfast.) 180 tablet 1   oxyCODONE-acetaminophen (PERCOCET) 10-325 MG tablet Take 1 tablet by mouth 3 (three) times daily as needed for pain. (Patient taking differently: Take 1 tablet by mouth every 6 (six) hours as needed for pain.) 30 tablet 0   polyethylene glycol (MIRALAX / GLYCOLAX) 17 g packet Take 17 g by mouth daily. (Patient taking differently: Take 17 g by mouth daily as needed (constipation.).) 30 each  0   Prasterone, DHEA, (DHEA PO) Take 1 capsule by mouth daily.     rivaroxaban (XARELTO) 20 MG TABS tablet TAKE ONE TABLET BY MOUTH DAILY WITH SUPPER (Patient taking differently: Take 20 mg by mouth daily with supper.) 90 tablet 3    senna-docusate (SENOKOT-S) 8.6-50 MG tablet Take 2 tablets by mouth at bedtime. (Patient taking differently: Take 2 tablets by mouth at bedtime as needed (constipation).) 120 tablet 0   sitaGLIPtin (JANUVIA) 100 MG tablet Take 1 tablet (100 mg total) by mouth daily. 90 tablet 3   sodium chloride flush 0.9 % SOLN injection Flush your drain with 3-5 mL of 0.9% normal every day 10 mL 3   testosterone cypionate (DEPOTESTOSTERONE CYPIONATE) 200 MG/ML injection Inject 200 mg into the muscle every 14 (fourteen) days.     valsartan (DIOVAN) 160 MG tablet Take 160 mg by mouth daily.     No current facility-administered medications on file prior to visit.    ROS Review of Systems  Constitutional:  Negative for fever.  Respiratory:  Negative for shortness of breath.   Cardiovascular:  Negative for chest pain.  Musculoskeletal:  Negative for arthralgias.  Skin:  Negative for rash.    Objective:  BP (!) 154/83   Pulse 80   Temp 98.3 F (36.8 C)   Ht 6' (1.829 m)   Wt (!) 347 lb 9.6 oz (157.7 kg)   SpO2 94%   BMI 47.14 kg/m   BP Readings from Last 3 Encounters:  02/21/23 (!) 154/83  02/17/23 (!) 170/85  02/07/23 (!) 164/92    Wt Readings from Last 3 Encounters:  02/21/23 (!) 347 lb 9.6 oz (157.7 kg)  02/07/23 (!) 337 lb (152.9 kg)  12/23/22 (!) 335 lb 12.8 oz (152.3 kg)     Physical Exam Vitals reviewed.  Constitutional:      Appearance: He is well-developed.  HENT:     Head: Normocephalic and atraumatic.     Right Ear: External ear normal.     Left Ear: External ear normal.     Mouth/Throat:     Pharynx: No oropharyngeal exudate or posterior oropharyngeal erythema.  Eyes:     Pupils: Pupils are equal, round, and reactive to light.  Cardiovascular:     Rate and Rhythm: Normal rate and regular rhythm.     Heart sounds: No murmur heard. Pulmonary:     Effort: No respiratory distress.     Breath sounds: Normal breath sounds.  Musculoskeletal:     Cervical back: Normal  range of motion and neck supple.  Neurological:     Mental Status: He is alert and oriented to person, place, and time.       Assessment & Plan:   Bradley Torres was seen today for medical management of chronic issues.  Diagnoses and all orders for this visit:  Type 2 diabetes mellitus with hyperglycemia, without long-term current use of insulin (HCC) -     Bayer DCA Hb A1c Waived  Hyperlipidemia associated with type 2 diabetes mellitus (HCC) -     Lipid panel  Hypertension associated with diabetes (HCC) -     CBC with Differential/Platelet -     CMP14+EGFR  Need for influenza vaccination -     Flu vaccine trivalent PF, 6mos and older(Flulaval,Afluria,Fluarix,Fluzone)      I am having Bradley Torres maintain his testosterone cypionate, Accu-Chek Softclix Lancets, dapagliflozin propanediol, Accu-Chek Guide, sitaGLIPtin, allopurinol, rivaroxaban, metFORMIN, oxyCODONE-acetaminophen, docusate sodium, polyethylene glycol, senna-docusate, atorvastatin, valsartan, hydrALAZINE, hydrALAZINE,  Cholecalciferol (VITAMIN D-3 PO), (Prasterone, DHEA, (DHEA PO)), sodium chloride flush, amiodarone, and chlorthalidone.  No orders of the defined types were placed in this encounter.    Follow-up: Return in about 3 months (around 05/24/2023).  Mechele Claude, M.D.

## 2023-02-22 LAB — CBC WITH DIFFERENTIAL/PLATELET
Basophils Absolute: 0.1 10*3/uL (ref 0.0–0.2)
Basos: 1 %
EOS (ABSOLUTE): 0.2 10*3/uL (ref 0.0–0.4)
Eos: 3 %
Hematocrit: 40.6 % (ref 37.5–51.0)
Hemoglobin: 12.9 g/dL — ABNORMAL LOW (ref 13.0–17.7)
Immature Grans (Abs): 0.1 10*3/uL (ref 0.0–0.1)
Immature Granulocytes: 1 %
Lymphocytes Absolute: 1 10*3/uL (ref 0.7–3.1)
Lymphs: 15 %
MCH: 30.4 pg (ref 26.6–33.0)
MCHC: 31.8 g/dL (ref 31.5–35.7)
MCV: 96 fL (ref 79–97)
Monocytes Absolute: 0.5 10*3/uL (ref 0.1–0.9)
Monocytes: 8 %
Neutrophils Absolute: 4.8 10*3/uL (ref 1.4–7.0)
Neutrophils: 72 %
Platelets: 315 10*3/uL (ref 150–450)
RBC: 4.24 x10E6/uL (ref 4.14–5.80)
RDW: 13.5 % (ref 11.6–15.4)
WBC: 6.6 10*3/uL (ref 3.4–10.8)

## 2023-02-22 LAB — CMP14+EGFR
ALT: 11 [IU]/L (ref 0–44)
AST: 11 [IU]/L (ref 0–40)
Albumin: 3.8 g/dL — ABNORMAL LOW (ref 3.9–4.9)
Alkaline Phosphatase: 93 [IU]/L (ref 44–121)
BUN/Creatinine Ratio: 12 (ref 10–24)
BUN: 18 mg/dL (ref 8–27)
Bilirubin Total: 0.3 mg/dL (ref 0.0–1.2)
CO2: 28 mmol/L (ref 20–29)
Calcium: 9.3 mg/dL (ref 8.6–10.2)
Chloride: 102 mmol/L (ref 96–106)
Creatinine, Ser: 1.47 mg/dL — ABNORMAL HIGH (ref 0.76–1.27)
Globulin, Total: 3.3 g/dL (ref 1.5–4.5)
Glucose: 233 mg/dL — ABNORMAL HIGH (ref 70–99)
Potassium: 4.2 mmol/L (ref 3.5–5.2)
Sodium: 142 mmol/L (ref 134–144)
Total Protein: 7.1 g/dL (ref 6.0–8.5)
eGFR: 54 mL/min/{1.73_m2} — ABNORMAL LOW (ref >59)

## 2023-02-22 LAB — LIPID PANEL
Chol/HDL Ratio: 3.7 {ratio} (ref 0.0–5.0)
Cholesterol, Total: 127 mg/dL (ref 100–199)
HDL: 34 mg/dL — ABNORMAL LOW (ref >39)
LDL Chol Calc (NIH): 74 mg/dL (ref 0–99)
Triglycerides: 104 mg/dL (ref 0–149)
VLDL Cholesterol Cal: 19 mg/dL (ref 5–40)

## 2023-03-02 DIAGNOSIS — I10 Essential (primary) hypertension: Secondary | ICD-10-CM | POA: Diagnosis not present

## 2023-03-02 DIAGNOSIS — Z79899 Other long term (current) drug therapy: Secondary | ICD-10-CM | POA: Diagnosis not present

## 2023-03-02 DIAGNOSIS — M539 Dorsopathy, unspecified: Secondary | ICD-10-CM | POA: Diagnosis not present

## 2023-03-02 DIAGNOSIS — E1165 Type 2 diabetes mellitus with hyperglycemia: Secondary | ICD-10-CM | POA: Diagnosis not present

## 2023-03-02 DIAGNOSIS — R030 Elevated blood-pressure reading, without diagnosis of hypertension: Secondary | ICD-10-CM | POA: Diagnosis not present

## 2023-03-02 DIAGNOSIS — Z6841 Body Mass Index (BMI) 40.0 and over, adult: Secondary | ICD-10-CM | POA: Diagnosis not present

## 2023-03-02 DIAGNOSIS — E291 Testicular hypofunction: Secondary | ICD-10-CM | POA: Diagnosis not present

## 2023-03-14 ENCOUNTER — Other Ambulatory Visit: Payer: Self-pay | Admitting: "Endocrinology

## 2023-03-15 ENCOUNTER — Other Ambulatory Visit: Payer: Self-pay | Admitting: Family Medicine

## 2023-03-18 ENCOUNTER — Other Ambulatory Visit: Payer: Self-pay | Admitting: Family Medicine

## 2023-03-18 DIAGNOSIS — I152 Hypertension secondary to endocrine disorders: Secondary | ICD-10-CM

## 2023-03-18 DIAGNOSIS — Z86711 Personal history of pulmonary embolism: Secondary | ICD-10-CM

## 2023-03-18 DIAGNOSIS — E1165 Type 2 diabetes mellitus with hyperglycemia: Secondary | ICD-10-CM

## 2023-03-27 ENCOUNTER — Telehealth: Payer: Self-pay | Admitting: Family Medicine

## 2023-03-31 ENCOUNTER — Ambulatory Visit (INDEPENDENT_AMBULATORY_CARE_PROVIDER_SITE_OTHER): Payer: Medicaid Other

## 2023-03-31 DIAGNOSIS — E1165 Type 2 diabetes mellitus with hyperglycemia: Secondary | ICD-10-CM

## 2023-03-31 NOTE — Progress Notes (Signed)
 Bradley Torres arrived 03/31/2023 and has given verbal consent to obtain images and complete their overdue diabetic retinal screening.  The images have been sent to an ophthalmologist or optometrist for review and interpretation.  Results will be sent back to Zollie Lowers, MD for review.  Patient has been informed they will be contacted when we receive the results via telephone or MyChart

## 2023-04-01 ENCOUNTER — Other Ambulatory Visit: Payer: Self-pay | Admitting: "Endocrinology

## 2023-04-01 DIAGNOSIS — N1831 Chronic kidney disease, stage 3a: Secondary | ICD-10-CM

## 2023-04-01 DIAGNOSIS — E1165 Type 2 diabetes mellitus with hyperglycemia: Secondary | ICD-10-CM

## 2023-04-04 ENCOUNTER — Other Ambulatory Visit: Payer: Self-pay | Admitting: Family Medicine

## 2023-04-04 DIAGNOSIS — I152 Hypertension secondary to endocrine disorders: Secondary | ICD-10-CM

## 2023-04-04 DIAGNOSIS — M109 Gout, unspecified: Secondary | ICD-10-CM

## 2023-04-04 DIAGNOSIS — E1165 Type 2 diabetes mellitus with hyperglycemia: Secondary | ICD-10-CM

## 2023-04-05 ENCOUNTER — Other Ambulatory Visit: Payer: Self-pay | Admitting: Family Medicine

## 2023-04-05 DIAGNOSIS — E1165 Type 2 diabetes mellitus with hyperglycemia: Secondary | ICD-10-CM

## 2023-04-05 DIAGNOSIS — N1831 Chronic kidney disease, stage 3a: Secondary | ICD-10-CM

## 2023-04-07 DIAGNOSIS — I10 Essential (primary) hypertension: Secondary | ICD-10-CM | POA: Diagnosis not present

## 2023-04-07 DIAGNOSIS — Z79899 Other long term (current) drug therapy: Secondary | ICD-10-CM | POA: Diagnosis not present

## 2023-04-07 DIAGNOSIS — E291 Testicular hypofunction: Secondary | ICD-10-CM | POA: Diagnosis not present

## 2023-04-07 DIAGNOSIS — Z6841 Body Mass Index (BMI) 40.0 and over, adult: Secondary | ICD-10-CM | POA: Diagnosis not present

## 2023-04-07 DIAGNOSIS — M539 Dorsopathy, unspecified: Secondary | ICD-10-CM | POA: Diagnosis not present

## 2023-04-07 DIAGNOSIS — E1165 Type 2 diabetes mellitus with hyperglycemia: Secondary | ICD-10-CM | POA: Diagnosis not present

## 2023-04-13 DIAGNOSIS — Z79899 Other long term (current) drug therapy: Secondary | ICD-10-CM | POA: Diagnosis not present

## 2023-04-27 ENCOUNTER — Other Ambulatory Visit: Payer: Self-pay

## 2023-04-27 ENCOUNTER — Inpatient Hospital Stay (HOSPITAL_COMMUNITY)
Admission: EM | Admit: 2023-04-27 | Discharge: 2023-05-05 | DRG: 393 | Disposition: A | Discharge status: Home / Self Care | Payer: Medicaid Other | Attending: Internal Medicine | Admitting: Internal Medicine

## 2023-04-27 ENCOUNTER — Ambulatory Visit: Payer: Self-pay | Admitting: Family Medicine

## 2023-04-27 ENCOUNTER — Encounter (HOSPITAL_COMMUNITY): Payer: Self-pay | Admitting: Emergency Medicine

## 2023-04-27 ENCOUNTER — Telehealth: Payer: Self-pay | Admitting: *Deleted

## 2023-04-27 ENCOUNTER — Emergency Department (HOSPITAL_COMMUNITY): Payer: Medicaid Other

## 2023-04-27 DIAGNOSIS — L02211 Cutaneous abscess of abdominal wall: Secondary | ICD-10-CM | POA: Diagnosis not present

## 2023-04-27 DIAGNOSIS — E785 Hyperlipidemia, unspecified: Secondary | ICD-10-CM | POA: Diagnosis present

## 2023-04-27 DIAGNOSIS — K828 Other specified diseases of gallbladder: Secondary | ICD-10-CM | POA: Diagnosis not present

## 2023-04-27 DIAGNOSIS — M109 Gout, unspecified: Secondary | ICD-10-CM | POA: Diagnosis present

## 2023-04-27 DIAGNOSIS — Z5986 Financial insecurity: Secondary | ICD-10-CM

## 2023-04-27 DIAGNOSIS — Z886 Allergy status to analgesic agent status: Secondary | ICD-10-CM

## 2023-04-27 DIAGNOSIS — I959 Hypotension, unspecified: Secondary | ICD-10-CM | POA: Diagnosis present

## 2023-04-27 DIAGNOSIS — I152 Hypertension secondary to endocrine disorders: Secondary | ICD-10-CM | POA: Diagnosis present

## 2023-04-27 DIAGNOSIS — N179 Acute kidney failure, unspecified: Secondary | ICD-10-CM | POA: Diagnosis present

## 2023-04-27 DIAGNOSIS — Z7901 Long term (current) use of anticoagulants: Secondary | ICD-10-CM

## 2023-04-27 DIAGNOSIS — I252 Old myocardial infarction: Secondary | ICD-10-CM

## 2023-04-27 DIAGNOSIS — R634 Abnormal weight loss: Secondary | ICD-10-CM | POA: Diagnosis present

## 2023-04-27 DIAGNOSIS — Z86718 Personal history of other venous thrombosis and embolism: Secondary | ICD-10-CM

## 2023-04-27 DIAGNOSIS — I5032 Chronic diastolic (congestive) heart failure: Secondary | ICD-10-CM | POA: Insufficient documentation

## 2023-04-27 DIAGNOSIS — N281 Cyst of kidney, acquired: Secondary | ICD-10-CM | POA: Diagnosis present

## 2023-04-27 DIAGNOSIS — Z6841 Body Mass Index (BMI) 40.0 and over, adult: Secondary | ICD-10-CM

## 2023-04-27 DIAGNOSIS — I272 Pulmonary hypertension, unspecified: Secondary | ICD-10-CM | POA: Diagnosis present

## 2023-04-27 DIAGNOSIS — N1831 Chronic kidney disease, stage 3a: Secondary | ICD-10-CM | POA: Diagnosis present

## 2023-04-27 DIAGNOSIS — Z883 Allergy status to other anti-infective agents status: Secondary | ICD-10-CM

## 2023-04-27 DIAGNOSIS — R9431 Abnormal electrocardiogram [ECG] [EKG]: Secondary | ICD-10-CM | POA: Insufficient documentation

## 2023-04-27 DIAGNOSIS — Z86711 Personal history of pulmonary embolism: Secondary | ICD-10-CM

## 2023-04-27 DIAGNOSIS — I5022 Chronic systolic (congestive) heart failure: Secondary | ICD-10-CM | POA: Diagnosis present

## 2023-04-27 DIAGNOSIS — Z888 Allergy status to other drugs, medicaments and biological substances status: Secondary | ICD-10-CM

## 2023-04-27 DIAGNOSIS — Z860100 Personal history of colon polyps, unspecified: Secondary | ICD-10-CM

## 2023-04-27 DIAGNOSIS — Z8249 Family history of ischemic heart disease and other diseases of the circulatory system: Secondary | ICD-10-CM

## 2023-04-27 DIAGNOSIS — K632 Fistula of intestine: Principal | ICD-10-CM | POA: Diagnosis present

## 2023-04-27 DIAGNOSIS — E66813 Obesity, class 3: Secondary | ICD-10-CM | POA: Diagnosis present

## 2023-04-27 DIAGNOSIS — F410 Panic disorder [episodic paroxysmal anxiety] without agoraphobia: Secondary | ICD-10-CM | POA: Diagnosis present

## 2023-04-27 DIAGNOSIS — E1122 Type 2 diabetes mellitus with diabetic chronic kidney disease: Secondary | ICD-10-CM | POA: Diagnosis present

## 2023-04-27 DIAGNOSIS — E1159 Type 2 diabetes mellitus with other circulatory complications: Secondary | ICD-10-CM | POA: Diagnosis present

## 2023-04-27 DIAGNOSIS — I10 Essential (primary) hypertension: Secondary | ICD-10-CM | POA: Diagnosis not present

## 2023-04-27 DIAGNOSIS — I480 Paroxysmal atrial fibrillation: Secondary | ICD-10-CM | POA: Diagnosis present

## 2023-04-27 DIAGNOSIS — K5730 Diverticulosis of large intestine without perforation or abscess without bleeding: Secondary | ICD-10-CM | POA: Diagnosis not present

## 2023-04-27 DIAGNOSIS — I2510 Atherosclerotic heart disease of native coronary artery without angina pectoris: Secondary | ICD-10-CM | POA: Diagnosis present

## 2023-04-27 DIAGNOSIS — L249 Irritant contact dermatitis, unspecified cause: Secondary | ICD-10-CM | POA: Diagnosis present

## 2023-04-27 DIAGNOSIS — Z79899 Other long term (current) drug therapy: Secondary | ICD-10-CM

## 2023-04-27 DIAGNOSIS — K651 Peritoneal abscess: Secondary | ICD-10-CM | POA: Diagnosis present

## 2023-04-27 DIAGNOSIS — Z8673 Personal history of transient ischemic attack (TIA), and cerebral infarction without residual deficits: Secondary | ICD-10-CM

## 2023-04-27 DIAGNOSIS — K8020 Calculus of gallbladder without cholecystitis without obstruction: Secondary | ICD-10-CM | POA: Diagnosis not present

## 2023-04-27 DIAGNOSIS — E662 Morbid (severe) obesity with alveolar hypoventilation: Secondary | ICD-10-CM | POA: Diagnosis present

## 2023-04-27 DIAGNOSIS — Z7984 Long term (current) use of oral hypoglycemic drugs: Secondary | ICD-10-CM

## 2023-04-27 DIAGNOSIS — G8929 Other chronic pain: Secondary | ICD-10-CM | POA: Diagnosis present

## 2023-04-27 LAB — COMPREHENSIVE METABOLIC PANEL
ALT: 30 U/L (ref 0–44)
AST: 22 U/L (ref 15–41)
Albumin: 3.1 g/dL — ABNORMAL LOW (ref 3.5–5.0)
Alkaline Phosphatase: 95 U/L (ref 38–126)
Anion gap: 15 (ref 5–15)
BUN: 33 mg/dL — ABNORMAL HIGH (ref 8–23)
CO2: 26 mmol/L (ref 22–32)
Calcium: 9.4 mg/dL (ref 8.9–10.3)
Chloride: 96 mmol/L — ABNORMAL LOW (ref 98–111)
Creatinine, Ser: 2.24 mg/dL — ABNORMAL HIGH (ref 0.61–1.24)
GFR, Estimated: 33 mL/min — ABNORMAL LOW (ref >60)
Glucose, Bld: 121 mg/dL — ABNORMAL HIGH (ref 70–99)
Potassium: 3.6 mmol/L (ref 3.5–5.1)
Sodium: 137 mmol/L (ref 135–145)
Total Bilirubin: 0.7 mg/dL (ref 0.0–1.2)
Total Protein: 7.9 g/dL (ref 6.5–8.1)

## 2023-04-27 LAB — CBC WITH DIFFERENTIAL/PLATELET
Abs Immature Granulocytes: 0.13 10*3/uL — ABNORMAL HIGH (ref 0.00–0.07)
Basophils Absolute: 0.1 10*3/uL (ref 0.0–0.1)
Basophils Relative: 1 %
Eosinophils Absolute: 0.1 10*3/uL (ref 0.0–0.5)
Eosinophils Relative: 1 %
HCT: 39.7 % (ref 39.0–52.0)
Hemoglobin: 13 g/dL (ref 13.0–17.0)
Immature Granulocytes: 2 %
Lymphocytes Relative: 9 %
Lymphs Abs: 0.7 10*3/uL (ref 0.7–4.0)
MCH: 31 pg (ref 26.0–34.0)
MCHC: 32.7 g/dL (ref 30.0–36.0)
MCV: 94.5 fL (ref 80.0–100.0)
Monocytes Absolute: 1.3 10*3/uL — ABNORMAL HIGH (ref 0.1–1.0)
Monocytes Relative: 17 %
Neutro Abs: 5.6 10*3/uL (ref 1.7–7.7)
Neutrophils Relative %: 70 %
Platelets: 373 10*3/uL (ref 150–400)
RBC: 4.2 MIL/uL — ABNORMAL LOW (ref 4.22–5.81)
RDW: 14.4 % (ref 11.5–15.5)
WBC: 7.9 10*3/uL (ref 4.0–10.5)
nRBC: 0 % (ref 0.0–0.2)

## 2023-04-27 LAB — I-STAT CG4 LACTIC ACID, ED
Lactic Acid, Venous: 3.5 mmol/L (ref 0.5–1.9)
Lactic Acid, Venous: 2.1 mmol/L (ref 0.5–1.9)

## 2023-04-27 MED ORDER — PIPERACILLIN-TAZOBACTAM 3.375 G IVPB 30 MIN
3.3750 g | Freq: Once | INTRAVENOUS | Status: AC
  Administered 2023-04-27: 3.375 g via INTRAVENOUS
  Filled 2023-04-27: qty 50

## 2023-04-27 MED ORDER — SODIUM CHLORIDE 0.9 % IV BOLUS
1000.0000 mL | Freq: Once | INTRAVENOUS | Status: AC
  Administered 2023-04-27: 1000 mL via INTRAVENOUS

## 2023-04-27 MED ORDER — PROCHLORPERAZINE EDISYLATE 10 MG/2ML IJ SOLN
10.0000 mg | Freq: Once | INTRAMUSCULAR | Status: AC
  Administered 2023-04-27: 10 mg via INTRAVENOUS
  Filled 2023-04-27: qty 2

## 2023-04-27 MED ORDER — METRONIDAZOLE 500 MG/100ML IV SOLN
500.0000 mg | Freq: Once | INTRAVENOUS | Status: AC
  Administered 2023-04-27: 500 mg via INTRAVENOUS
  Filled 2023-04-27: qty 100

## 2023-04-27 MED ORDER — HYDROMORPHONE HCL 1 MG/ML IJ SOLN
1.0000 mg | Freq: Once | INTRAMUSCULAR | Status: AC
  Administered 2023-04-27: 1 mg via INTRAVENOUS
  Filled 2023-04-27: qty 1

## 2023-04-27 MED ORDER — IOHEXOL 350 MG/ML SOLN
75.0000 mL | Freq: Once | INTRAVENOUS | Status: AC | PRN
  Administered 2023-04-27: 75 mL via INTRAVENOUS

## 2023-04-27 NOTE — ED Notes (Signed)
Patient transported to CT

## 2023-04-27 NOTE — ED Provider Notes (Signed)
[place] EMERGENCY DEPARTMENT AT [facility] Provider Note   CSN: [id number] Arrival date & time: 04/27/23  1554     History  Chief Complaint  Patient presents with   Post-op Problem    Bradley Torres is a 63 y.o. male hx of hypertension, recurrent diverticulitis with abscess with fistula here presenting with purulent drainage from the left lower quadrant.  Patient had a complicated history of diverticulitis with recurrent abscesses.  Patient had a JP drain placed by interventional radiology back in July.  It was removed in November.  Patient states that he was doing well until about 3 days ago.  He noticed some purulent versus feculent discharge from the wound where the JP drain was.  Patient denies any fevers.  Given patient history, he came in for concern for possible recurrent abscess versus fistula.  The history is provided by the patient.       Home Medications Prior to Admission medications   Medication Sig Start Date End Date Taking? Authorizing Provider  Accu-Chek Softclix Lancets lancets Use as instructed to monitor glucose twice daily 09/23/21   Dani Gobble, NP  allopurinol (ZYLOPRIM) 100 MG tablet TAKE ONE TABLET BY MOUTH ONCE DAILY 04/04/23   Mechele Claude, MD  amiodarone (PACERONE) 200 MG tablet Take 1 tablet (200 mg total) by mouth daily. 01/12/23   Antoine Poche, MD  atorvastatin (LIPITOR) 20 MG tablet TAKE ONE TABLET BY MOUTH ONCE DAILY 08/01/22   Antoine Poche, MD  chlorthalidone (HYGROTON) 25 MG tablet TAKE 1 TABLET BY MOUTH EVERY OTHER DAY 01/20/23   Sharlene Dory, NP  Cholecalciferol (VITAMIN D-3 PO) Take 1 tablet by mouth daily.    [provider]  dapagliflozin propanediol (FARXIGA) 10 MG TABS tablet TAKE ONE TABLET BY MOUTH DAILY BEFORE BREAKFAST 04/05/23   Mechele Claude, MD  docusate sodium (COLACE) 100 MG capsule Take 1 capsule (100 mg total) by mouth 2 (two) times daily. Patient taking differently: Take 100 mg by mouth 2  (two) times daily as needed (constipation.). 06/28/22   Osvaldo Shipper, MD  glucose blood (ACCU-CHEK GUIDE) test strip Use as instructed to monitor glucose twice daily 10/28/21   Dani Gobble, NP  hydrALAZINE (APRESOLINE) 25 MG tablet Take 1 tablet (25 mg total) by mouth daily. 09/08/22   Sharlene Dory, NP  hydrALAZINE (APRESOLINE) 50 MG tablet Take 50 mg by mouth daily.    [provider]  JANUVIA 100 MG tablet TAKE ONE TABLET BY MOUTH ONCE DAILY 04/05/23   Mechele Claude, MD  metFORMIN (GLUCOPHAGE-XR) 750 MG 24 hr tablet Take 2 tablets (1,500 mg total) by mouth daily with breakfast. Patient taking differently: Take 750 mg by mouth daily with breakfast. 05/11/22   Mechele Claude, MD  oxyCODONE-acetaminophen (PERCOCET) 10-325 MG tablet Take 1 tablet by mouth 3 (three) times daily as needed for pain. Patient taking differently: Take 1 tablet by mouth every 6 (six) hours as needed for pain. 06/28/22   Osvaldo Shipper, MD  polyethylene glycol (MIRALAX / GLYCOLAX) 17 g packet Take 17 g by mouth daily. Patient taking differently: Take 17 g by mouth daily as needed (constipation.). 06/28/22   Osvaldo Shipper, MD  Prasterone, DHEA, (DHEA PO) Take 1 capsule by mouth daily.    [provider]  rivaroxaban (XARELTO) 20 MG TABS tablet TAKE ONE TABLET BY MOUTH DAILY WITH SUPPER 03/20/23   Mechele Claude, MD  senna-docusate (SENOKOT-S) 8.6-50 MG tablet Take 2 tablets by mouth at bedtime. Patient taking  differently: Take 2 tablets by mouth at bedtime as needed (constipation). 06/28/22   Osvaldo Shipper, MD  sodium chloride flush 0.9 % SOLN injection Flush your drain with 3-5 mL of 0.9% normal every day 12/28/22   Lynnette Caffey A, PA-C  testosterone cypionate (DEPOTESTOSTERONE CYPIONATE) 200 MG/ML injection Inject 200 mg into the muscle every 14 (fourteen) days. 07/30/21   [provider]  valsartan (DIOVAN) 160 MG tablet TAKE 1 TABLET BY MOUTH EVERY DAY 03/15/23   Mechele Claude, MD       Allergies    Actos [pioglitazone], Zestril [lisinopril], Aldactone [spironolactone], Bystolic [nebivolol hcl], Motrin [ibuprofen], Trulicity [dulaglutide], Cozaar [losartan], Diflucan [fluconazole], Hctz [hydrochlorothiazide], and Norvasc [amlodipine]    Review of Systems   Review of Systems  Gastrointestinal:  Positive for abdominal pain.  All other systems reviewed and are negative.   Physical Exam Updated Vital Signs BP 104/60   Pulse 86   Temp 97.7 F (36.5 C) (Oral)   Resp (!) 25   Ht 6' (1.829 m)   Wt (!) 158.8 kg   SpO2 92%   BMI 47.47 kg/m  Physical Exam Vitals and nursing note reviewed.  HENT:     Head: Normocephalic.     Nose: Nose normal.     Mouth/Throat:     Mouth: Mucous membranes are moist.  Eyes:     Extraocular Movements: Extraocular movements intact.     Pupils: Pupils are equal, round, and reactive to light.  Cardiovascular:     Rate and Rhythm: Normal rate and regular rhythm.     Pulses: Normal pulses.     Heart sounds: Normal heart sounds.  Pulmonary:     Effort: Pulmonary effort is normal.     Breath sounds: Normal breath sounds.  Abdominal:     Comments: Distended, + purulent vs feculent discharge to LLQ JP drain site   Musculoskeletal:        General: Normal range of motion.     Cervical back: Normal range of motion and neck supple.  Skin:    General: Skin is warm.     Capillary Refill: Capillary refill takes less than 2 seconds.  Neurological:     General: No focal deficit present.     Mental Status: He is alert.  Psychiatric:        Mood and Affect: Mood normal.     ED Results / Procedures / Treatments   Labs (all labs ordered are listed, but only abnormal results are displayed) Labs Reviewed  CBC WITH DIFFERENTIAL/PLATELET - Abnormal; Notable for the following components:      Result Value   RBC 4.20 (*)    Monocytes Absolute 1.3 (*)    Abs Immature Granulocytes 0.13 (*)    All other components within normal limits   COMPREHENSIVE METABOLIC PANEL - Abnormal; Notable for the following components:   Chloride 96 (*)    Glucose, Bld 121 (*)    BUN 33 (*)    Creatinine, Ser 2.24 (*)    Albumin 3.1 (*)    GFR, Estimated 33 (*)    All other components within normal limits  I-STAT CG4 LACTIC ACID, ED - Abnormal; Notable for the following components:   Lactic Acid, Venous 3.5 (*)    All other components within normal limits  I-STAT CG4 LACTIC ACID, ED - Abnormal; Notable for the following components:   Lactic Acid, Venous 2.1 (*)    All other components within normal limits  CULTURE, BLOOD (ROUTINE X 2)  CULTURE, BLOOD (ROUTINE X 2)    EKG EKG Interpretation Date/Time:  Thursday April 27 2023 18:54:59 EST Ventricular Rate:  91 PR Interval:  153 QRS Duration:  99 QT Interval:  447 QTC Calculation: 550 R Axis:   44  Text Interpretation: Sinus rhythm No previous ECGs available Confirmed by Bradley Torres [phone]) on 04/27/2023 8:00:08 PM  Radiology CT ABDOMEN PELVIS W CONTRAST Result Date: 04/27/2023 CLINICAL DATA:  Diverticulitis, complication suspected known fisula and abscess Patient reports increased drainage from left lower quadrant operative site. Drain removed 3 months ago. EXAM: CT ABDOMEN AND PELVIS WITH CONTRAST TECHNIQUE: Multidetector CT imaging of the abdomen and pelvis was performed using the standard protocol following bolus administration of intravenous contrast. RADIATION DOSE REDUCTION: This exam was performed according to the departmental dose-optimization program which includes automated exposure control, adjustment of the mA and/or kV according to patient size and/or use of iterative reconstruction technique. CONTRAST:  75mL OMNIPAQUE IOHEXOL 350 MG/ML SOLN COMPARISON:  CT 10/26/2022 FINDINGS: Lower chest: No basilar airspace disease or pleural effusion. Hepatobiliary: Scattered hepatic hypodensities are too small to characterize but unchanged from prior exam, likely cysts. Moderate  gallbladder distension. Small calcified gallstones. No pericholecystic inflammation. No biliary dilatation. Pancreas: No ductal dilatation or inflammation. Spleen: Normal in size without focal abnormality.  Small splenule. Adrenals/Urinary Tract: Normal adrenal glands. No hydronephrosis or perinephric edema. Homogeneous renal enhancement. Lobulated renal contours. Unchanged right renal cyst. No further follow-up imaging is recommended. Urinary bladder is physiologically distended without wall thickening. Stomach/Bowel: Unremarkable appearance of the stomach. No small bowel obstruction or inflammation. The appendix is normal. Colonic diverticulosis without acute diverticulitis. Vascular/Lymphatic: Aortic atherosclerosis. No aneurysm. The portal vein is patent. Prominent mesenteric nodes measuring up to 10 mm. This is nonspecific. Reproductive: Prostate is unremarkable. Other: At site of prior drainage catheter there is a left lower quadrant tract extending from the skin surface to the abdominal wall. In the deep subcutaneous tissues is a area of soft tissue density with internal air measuring 4.3 x 3.6 cm. Only minimal internal fluid. Surrounding fat stranding and inflammation. This does not definitively connect to the intra-abdominal contents or subjacent bowel. No free air or ascites. Musculoskeletal: L4-L5 fusion hardware. There are no acute or suspicious osseous abnormalities. IMPRESSION: 1. Heterogeneous collection in the left abdominal wall at site of prior drainage catheter. Tract extends from the skin surface to the abdominal wall. In the deep subcutaneous tissues is a area of soft tissue density with internal air measuring 4.3 x 3.6 cm. Only minimal internal fluid. Surrounding fat stranding and inflammation. This does not definitively connect to the intra-abdominal contents or subjacent bowel. 2. Colonic diverticulosis without acute diverticulitis. 3. Cholelithiasis. Electronically Signed   By: Narda Rutherford M.D.   On: 04/27/2023 20:48    Procedures Procedures     Medications Ordered in ED Medications  sodium chloride 0.9 % bolus 1,000 mL (0 mLs Intravenous Stopped 04/27/23 2117)  prochlorperazine (COMPAZINE) injection 10 mg (10 mg Intravenous Given 04/27/23 1856)  piperacillin-tazobactam (ZOSYN) IVPB 3.375 g (0 g Intravenous Stopped 04/27/23 2006)  metroNIDAZOLE (FLAGYL) IVPB 500 mg (0 mg Intravenous Stopped 04/27/23 2117)  HYDROmorphone (DILAUDID) injection 1 mg (1 mg Intravenous Given 04/27/23 1857)  iohexol (OMNIPAQUE) 350 MG/ML injection 75 mL (75 mLs Intravenous Contrast Given 04/27/23 2004)    ED Course/ Medical Decision Making/ A&P  Medical Decision Making Bradley Torres is a 63 y.o. male here with left lower quadrant pain and discharge.  Patient has known colonic fistula. Patient is hypotensive. Will get cbc, cmp, lactate, cultures, CT ab/pel. Will give IVF and pain meds and likely need admission.   9:17 PM Reviewed patient's labs and initial lactate is 3.5 and went down to 2.1.  White blood cell count is normal.  CT abdomen pelvis showed abdominal wall abscess but no intra-abdominal abscess or diverticulitis.  I consulted Dr. Loreta Ave from IR.  He agreed with broad-spectrum antibiotics and pain control.  He recommend IR consult in the morning and n.p.o. after midnight.  Problems Addressed: Abdominal wall abscess: acute illness or injury  Amount and/or Complexity of Data Reviewed Labs: ordered. Decision-making details documented in ED Course. Radiology: ordered and independent interpretation performed. Decision-making details documented in ED Course. ECG/medicine tests: ordered and independent interpretation performed. Decision-making details documented in ED Course.  Risk Prescription drug management. Decision regarding hospitalization.    Final Clinical Impression(s) / ED Diagnoses Final diagnoses:  Abdominal wall abscess    Rx /  DC Orders ED Discharge Orders     None         Charlynne Pander, MD 04/27/23 2118

## 2023-04-27 NOTE — Telephone Encounter (Signed)
Received call from patient [phone]~ telephone.   Patient reports that IR removed drain from sigmoid diverticular abscess 02/17/2023. States that since that time, abdominal wound has not resolved.   Reports that he has new drainage that appears to be purulent or pus like. Reports that he also has a few days of malaise. States that he has not felt well enough to deal with drainage at this time and feels that abscess may be re-forming.   Patient states that he cannot check his temperature, but thinks he has had fever and chills. States that he is also having some nausea.   No available appointments noted with Dr.Jenkins.   Advised to contact PCP or go to ER for immediate evaluation. Advised that he may require additional imaging from IR to assess fistula to determine if abscess has returned.   Call placed to [facility] IR. Was advised that new orders would be required to assess patient.   Per chart notes with PCP office, patient is going to UC for evaluation.

## 2023-04-27 NOTE — ED Triage Notes (Signed)
Pt reports redness and increased drainage from post-op site to LLQ. Drain removed x 3 months ago but pt states it is not healing. Pt also reports dizziness and nausea. Denies vomiting or diarrhea. Normal BM this morning. Moderate drainage noted to dressing in place. Afebrile in triage.

## 2023-04-27 NOTE — Telephone Encounter (Signed)
  Chief Complaint: abdominal wound, possibly infected Symptoms: redness with clear milky drainage to left abdominal wound near abdomen, felt feverish, body aches, chills nausea, lightheaded Frequency: worsened x 6 days Pertinent Negatives: Patient denies redness spreading Disposition: [] ED /[x] Urgent Care (no appt availability in office) / [] Appointment(In office/virtual)/ []  [place] [facility]/ [] Home Care/ [] Refused Recommended Disposition /[] [place] [facility]/ []  Follow-up with PCP Additional Notes: Patient agreeable to being seen today for infected surgical wound. Patient states he called his surgeon earlier and was told to follow up with his PCP. Patient states he prefers to go to urgent care instead of going to acute visit at another primary care office. Copied from CRM [id number]. Topic: Clinical - Red Word Triage >> Apr 27, 2023  1:18 PM Antony Haste wrote: Red Word that prompted transfer to Nurse Triage: Patient is having irritation in his belly, possible infection, and redness to the area where his drain bag is located for his colon from his procedure that was completed. Reason for Disposition  [1] Looks infected (spreading redness, red streak, pus) AND [2] fever  Answer Assessment - Initial Assessment Questions 1. LOCATION: "Where is the wound located?"      Left abdomen next to belly button.  2. WOUND APPEARANCE: "What does the wound look like?"      Redness with clear/milky drainage.  3. SIZE: If redness is present, ask: "What is the size of the red area?" (Inches, centimeters, or compare to size of a coin)      About the size of a quarter.  4. SPREAD: "What's changed in the last day?"  "Do you see any red streaks coming from the wound?"     Denies redness spreading but states the drainage is causing irritation to skin.  5. ONSET: "When did it start to look infected?"      Over this past weekend.  6. MECHANISM: "How did the wound start, what was the cause?"     Patient  states he had a "drainage bag" placed March 2024 and removed about 3 months ago and states the incision did not close and is still draining.  7. PAIN: "Is there any pain?" If Yes, ask: "How bad is the pain?"   (Scale 1-10; or mild, moderate, severe)     8/10 near incision/wound.  8. FEVER: "Do you have a fever?" If Yes, ask: "What is your temperature, how was it measured, and when did it start?"     "I think so, but I don't think I have one now. I don't have any way to check my temperature"  9. OTHER SYMPTOMS: "Do you have any other symptoms?" (e.g., shaking chills, weakness, rash elsewhere on body)     Body aches, chills, nausea, lightheaded.  Protocols used: Wound Infection-A-AH

## 2023-04-27 NOTE — H&P (Signed)
History and Physical    Bradley Torres EXB:[id number] DOB: [birth year] DOA: 04/27/2023  PCP: Mechele Claude, MD  Patient coming from: Home  Chief Complaint: Abdominal wall wound  HPI: Bradley Torres is a 63 y.o. male with medical history significant of CAD, paroxysmal A-fib on Xarelto, type 2 diabetes, DVT/PE in 2019, hypertension, class III obesity (BMI 47.47), OSA/OHS on CPAP, pulmonary hypertension, chronic HFpEF, stroke, chronic back pain, gout, hyperlipidemia, hypogonadism, CKD stage IIIa, history of perforated sigmoid diverticulitis with abscess and JP drain placement in July 2024.  Patient was followed by interventional radiology.  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  His JP drain was removed in November.  Patient presents to the ED today complaining of purulent versus feculent discharge from the wound where the JP drain was.  Vital signs on arrival: Temperature 98.5 F, pulse 102, respiratory rate 18, blood pressure 92/78, and SpO2 95% on room air.  Labs showing no leukocytosis, glucose 121, BUN 33, creatinine 2.2 (baseline 1.4), lactic acid 3.5> 2.1, blood cultures collected.  CT abdomen pelvis with findings concerning for left abdominal wall abscess at the site of prior drainage catheter. EDP has consulted IR (Dr. Loreta Ave), recommended keeping the patient n.p.o. after midnight.  IR will consult in the morning.  Patient was given Dilaudid, Compazine, metronidazole, Zosyn, and 1 L normal saline.  TRH called to admit.  Patient states for the past 4 days he has noticed large amounts of purulent versus feculent drainage from the site where his JP catheter used to be.  He reports subjective fevers and chills.  Reports pain in this area and is requesting additional pain medications.  He has felt nauseous but has not vomited.  Last bowel movement was this morning.  He has no other complaints.  Denies cough, shortness breath, or chest pain.  Review of Systems:  Review of Systems  All other  systems reviewed and are negative.   Past Medical History:  Diagnosis Date   CAD (coronary artery disease)    a. 12/2017: cath showing 10% Proximal-LAD stenosis with no significant obstructive disease and LVEDP mildly elevated at 18 mm Hg.    Chronic back pain    Diabetes mellitus, type 2 (HCC)    DVT (deep venous thrombosis) (HCC)    Essential hypertension    Family hx of colon cancer 05/03/2017   MI (myocardial infarction) (HCC) 2019   Morbid obesity (HCC)    Panic disorder    Shoulder pain, left    Following fall   Sleep apnea    Stroke (HCC) 01/2018   Vitamin D deficiency     Past Surgical History:  Procedure Laterality Date   Ankle fracture sugery Left    BIOPSY  07/20/2020   Procedure: BIOPSY;  Surgeon: Corbin Ade, MD;  Location: [facility];  Service: Endoscopy;;  cecal polyp   COLONOSCOPY WITH PROPOFOL N/A 05/29/2017   Dr. Jena Gauss: Multiple tubular adenomas removed, prep inadequate.  Short interval surveillance colonoscopy recommended in 6 months   COLONOSCOPY WITH PROPOFOL N/A 07/20/2020   Procedure: COLONOSCOPY WITH PROPOFOL;  Surgeon: Corbin Ade, MD;  Location: [facility];  Service: Endoscopy;  Laterality: N/A;  am appt, early as possible since diabetic   INTERVENTIONAL RADIOLOGY PROCEDURE  10/08/2022   JP drain placement due to diverticular abscess   IR CATHETER TUBE CHANGE  01/16/2023   IR RADIOLOGIST EVAL & MGMT  10/26/2022   IR RADIOLOGIST EVAL & MGMT  01/04/2023   IR  RADIOLOGIST EVAL & MGMT  01/30/2023   IR RADIOLOGIST EVAL & MGMT  02/17/2023   LEFT HEART CATH AND CORONARY ANGIOGRAPHY N/A 01/23/2018   Procedure: LEFT HEART CATH AND CORONARY ANGIOGRAPHY;  Surgeon: Lennette Bihari, MD;  Location: [facility];  Service: Cardiovascular;  Laterality: N/A;   LUMBAR SPINE SURGERY     fusion   POLYPECTOMY  05/29/2017   Procedure: POLYPECTOMY;  Surgeon: Corbin Ade, MD;  Location: [facility];  Service: Endoscopy;;  ascending colon polyp  x5-cs transverse colon polyp x4- cs descending colon polyp x1- hs sigmoid colon polyp x1 -hs rectal polyp x1- hs   TONSILLECTOMY       reports that he has never smoked. He has never been exposed to tobacco smoke. He has never used smokeless tobacco. He reports current alcohol use. He reports that he does not currently use drugs after having used the following drugs: Marijuana.  Allergies  Allergen Reactions   Aldactone [Spironolactone] Hives   Bystolic [Nebivolol Hcl] Swelling   Motrin [Ibuprofen] Other (See Comments)    Rectal bleed   Actos [Pioglitazone] Other (See Comments) and Cough    Dyspnea  Edema   Trulicity [Dulaglutide] Other (See Comments) and Cough    Shortness of breath GI intolerance   Zestril [Lisinopril] Other (See Comments) and Cough    Wheezing    Cozaar [Losartan] Rash   Diflucan [Fluconazole] Rash   Hctz [Hydrochlorothiazide] Other (See Comments)    Gout flares.   Norvasc [Amlodipine] Itching and Other (See Comments)    Myalgias     Family History  Problem Relation Age of Onset   Colon cancer Mother        died at age 57   Heart attack Father    Hepatitis Sister 44       Autoimmune   Heart attack Paternal Grandfather     Prior to Admission medications   Medication Sig Start Date End Date Taking? Authorizing Provider  allopurinol (ZYLOPRIM) 100 MG tablet TAKE ONE TABLET BY MOUTH ONCE DAILY 04/04/23  Yes Stacks, Broadus John, MD  amiodarone (PACERONE) 200 MG tablet Take 1 tablet (200 mg total) by mouth daily. 01/12/23  Yes Antoine Poche, MD  atorvastatin (LIPITOR) 20 MG tablet TAKE ONE TABLET BY MOUTH ONCE DAILY 08/01/22  Yes Branch, Dorothe Pea, MD  chlorthalidone (HYGROTON) 25 MG tablet TAKE 1 TABLET BY MOUTH EVERY OTHER DAY 01/20/23  Yes Sharlene Dory, NP  Cholecalciferol (VITAMIN D-3 PO) Take 1 tablet by mouth daily.   Yes [provider]  dapagliflozin propanediol (FARXIGA) 10 MG TABS tablet TAKE ONE TABLET BY MOUTH DAILY BEFORE BREAKFAST  04/05/23  Yes Stacks, Broadus John, MD  docusate sodium (COLACE) 100 MG capsule Take 1 capsule (100 mg total) by mouth 2 (two) times daily. Patient taking differently: Take 100 mg by mouth 2 (two) times daily as needed (constipation.). 06/28/22  Yes Osvaldo Shipper, MD  hydrALAZINE (APRESOLINE) 25 MG tablet Take 1 tablet (25 mg total) by mouth daily. 09/08/22  Yes Sharlene Dory, NP  JANUVIA 100 MG tablet TAKE ONE TABLET BY MOUTH ONCE DAILY 04/05/23  Yes Mechele Claude, MD  metFORMIN (GLUCOPHAGE-XR) 750 MG 24 hr tablet Take 2 tablets (1,500 mg total) by mouth daily with breakfast. Patient taking differently: Take 750 mg by mouth daily with breakfast. 05/11/22  Yes Mechele Claude, MD  oxyCODONE-acetaminophen (PERCOCET) 10-325 MG tablet Take 1 tablet by mouth 3 (three) times daily as needed for pain. Patient taking differently: Take 1  tablet by mouth every 6 (six) hours as needed for pain. 06/28/22  Yes Osvaldo Shipper, MD  polyethylene glycol (MIRALAX / GLYCOLAX) 17 g packet Take 17 g by mouth daily. Patient taking differently: Take 17 g by mouth daily as needed (constipation.). 06/28/22  Yes Osvaldo Shipper, MD  Prasterone, DHEA, (DHEA PO) Take 1 capsule by mouth daily.   Yes [provider]  rivaroxaban (XARELTO) 20 MG TABS tablet TAKE ONE TABLET BY MOUTH DAILY WITH SUPPER 03/20/23  Yes Stacks, Broadus John, MD  senna-docusate (SENOKOT-S) 8.6-50 MG tablet Take 2 tablets by mouth at bedtime. Patient taking differently: Take 2 tablets by mouth at bedtime as needed (constipation). 06/28/22  Yes Osvaldo Shipper, MD  valsartan (DIOVAN) 160 MG tablet TAKE 1 TABLET BY MOUTH EVERY DAY 03/15/23  Yes Mechele Claude, MD  Accu-Chek Softclix Lancets lancets Use as instructed to monitor glucose twice daily 09/23/21   Dani Gobble, NP  glucose blood (ACCU-CHEK GUIDE) test strip Use as instructed to monitor glucose twice daily 10/28/21   Dani Gobble, NP  sodium chloride flush 0.9 % SOLN injection Flush your drain with  3-5 mL of 0.9% normal every day Patient not taking: Reported on 04/27/2023 12/28/22   Lynnette Caffey A, PA-C  testosterone cypionate (DEPOTESTOSTERONE CYPIONATE) 200 MG/ML injection Inject 200 mg into the muscle every 14 (fourteen) days. Patient not taking: Reported on 04/27/2023 07/30/21   [provider]    Physical Exam: Vitals:   04/27/23 1902 04/27/23 1905 04/27/23 1943 04/27/23 2137  BP: (!) 91/51 104/60  (!) 124/51  Pulse: 92 86  93  Resp: 20 (!) 25  17  Temp:   97.7 F (36.5 C)   TempSrc:   Oral   SpO2: 100% 92%  100%  Weight:      Height:        Physical Exam Vitals reviewed.  Constitutional:      General: He is not in acute distress. HENT:     Head: Normocephalic and atraumatic.  Eyes:     Extraocular Movements: Extraocular movements intact.  Cardiovascular:     Rate and Rhythm: Normal rate and regular rhythm.     Pulses: Normal pulses.  Pulmonary:     Effort: Pulmonary effort is normal. No respiratory distress.     Breath sounds: Normal breath sounds. No wheezing or rales.  Abdominal:     General: Bowel sounds are normal. There is no distension.     Palpations: Abdomen is soft.     Tenderness: There is no abdominal tenderness. There is no guarding.     Comments: Copious purulent versus feculent drainage from the left lower quadrant abdominal wall where his JP drain previously used to be.  Musculoskeletal:     Cervical back: Normal range of motion.     Right lower leg: No edema.     Left lower leg: No edema.  Skin:    General: Skin is warm and dry.  Neurological:     General: No focal deficit present.     Mental Status: He is alert and oriented to person, place, and time.     Labs on Admission: I have personally reviewed following labs and imaging studies  CBC: Recent Labs  Lab 04/27/23 1810  WBC 7.9  NEUTROABS 5.6  HGB 13.0  HCT 39.7  MCV 94.5  PLT 373   Basic Metabolic Panel: Recent Labs  Lab 04/27/23 1810  NA 137  K 3.6  CL  96*  CO2 26  GLUCOSE 121*  BUN 33*  CREATININE 2.24*  CALCIUM 9.4   GFR: Estimated Creatinine Clearance: 53.9 mL/min (A) (by C-G formula based on SCr of 2.24 mg/dL (H)). Liver Function Tests: Recent Labs  Lab 04/27/23 1810  AST 22  ALT 30  ALKPHOS 95  BILITOT 0.7  PROT 7.9  ALBUMIN 3.1*   No results for input(s): "LIPASE", "AMYLASE" in the last 168 hours. No results for input(s): "AMMONIA" in the last 168 hours. Coagulation Profile: No results for input(s): "INR", "PROTIME" in the last 168 hours. Cardiac Enzymes: No results for input(s): "CKTOTAL", "CKMB", "CKMBINDEX", "TROPONINI" in the last 168 hours. BNP (last 3 results) No results for input(s): "PROBNP" in the last 8760 hours. HbA1C: No results for input(s): "HGBA1C" in the last 72 hours. CBG: No results for input(s): "GLUCAP" in the last 168 hours. Lipid Profile: No results for input(s): "CHOL", "HDL", "LDLCALC", "TRIG", "CHOLHDL", "LDLDIRECT" in the last 72 hours. Thyroid Function Tests: No results for input(s): "TSH", "T4TOTAL", "FREET4", "T3FREE", "THYROIDAB" in the last 72 hours. Anemia Panel: No results for input(s): "VITAMINB12", "FOLATE", "FERRITIN", "TIBC", "IRON", "RETICCTPCT" in the last 72 hours. Urine analysis:    Component Value Date/Time   COLORURINE YELLOW 10/01/2022 1050   APPEARANCEUR HAZY (A) 10/01/2022 1050   LABSPEC 1.040 (H) 10/01/2022 1050   PHURINE 5.0 10/01/2022 1050   GLUCOSEU >=500 (A) 10/01/2022 1050   HGBUR NEGATIVE 10/01/2022 1050   BILIRUBINUR NEGATIVE 10/01/2022 1050   KETONESUR NEGATIVE 10/01/2022 1050   PROTEINUR 30 (A) 10/01/2022 1050   NITRITE NEGATIVE 10/01/2022 1050   LEUKOCYTESUR NEGATIVE 10/01/2022 1050    Radiological Exams on Admission: CT ABDOMEN PELVIS W CONTRAST Result Date: 04/27/2023 CLINICAL DATA:  Diverticulitis, complication suspected known fisula and abscess Patient reports increased drainage from left lower quadrant operative site. Drain removed 3 months  ago. EXAM: CT ABDOMEN AND PELVIS WITH CONTRAST TECHNIQUE: Multidetector CT imaging of the abdomen and pelvis was performed using the standard protocol following bolus administration of intravenous contrast. RADIATION DOSE REDUCTION: This exam was performed according to the departmental dose-optimization program which includes automated exposure control, adjustment of the mA and/or kV according to patient size and/or use of iterative reconstruction technique. CONTRAST:  75mL OMNIPAQUE IOHEXOL 350 MG/ML SOLN COMPARISON:  CT 10/26/2022 FINDINGS: Lower chest: No basilar airspace disease or pleural effusion. Hepatobiliary: Scattered hepatic hypodensities are too small to characterize but unchanged from prior exam, likely cysts. Moderate gallbladder distension. Small calcified gallstones. No pericholecystic inflammation. No biliary dilatation. Pancreas: No ductal dilatation or inflammation. Spleen: Normal in size without focal abnormality.  Small splenule. Adrenals/Urinary Tract: Normal adrenal glands. No hydronephrosis or perinephric edema. Homogeneous renal enhancement. Lobulated renal contours. Unchanged right renal cyst. No further follow-up imaging is recommended. Urinary bladder is physiologically distended without wall thickening. Stomach/Bowel: Unremarkable appearance of the stomach. No small bowel obstruction or inflammation. The appendix is normal. Colonic diverticulosis without acute diverticulitis. Vascular/Lymphatic: Aortic atherosclerosis. No aneurysm. The portal vein is patent. Prominent mesenteric nodes measuring up to 10 mm. This is nonspecific. Reproductive: Prostate is unremarkable. Other: At site of prior drainage catheter there is a left lower quadrant tract extending from the skin surface to the abdominal wall. In the deep subcutaneous tissues is a area of soft tissue density with internal air measuring 4.3 x 3.6 cm. Only minimal internal fluid. Surrounding fat stranding and inflammation. This does  not definitively connect to the intra-abdominal contents or subjacent bowel. No free air or ascites. Musculoskeletal: L4-L5 fusion hardware. There are no acute  or suspicious osseous abnormalities. IMPRESSION: 1. Heterogeneous collection in the left abdominal wall at site of prior drainage catheter. Tract extends from the skin surface to the abdominal wall. In the deep subcutaneous tissues is a area of soft tissue density with internal air measuring 4.3 x 3.6 cm. Only minimal internal fluid. Surrounding fat stranding and inflammation. This does not definitively connect to the intra-abdominal contents or subjacent bowel. 2. Colonic diverticulosis without acute diverticulitis. 3. Cholelithiasis. Electronically Signed   By: Narda Rutherford M.D.   On: 04/27/2023 20:48    EKG: Independently reviewed.  Sinus rhythm, QTc 550.  Assessment and Plan  Abdominal wall abscess Patient with history of perforated sigmoid diverticulitis with abscess and JP drain placement in July 2024.  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  His JP drain was removed in November.  He presents to the ED today due to copious purulent versus feculent drainage from his JP drain site for the past 4 days.  No fever or leukocytosis.  Lactate has trended down and slight tachycardia resolved after 1 L IV fluids.  No signs of sepsis at this time.  CT abdomen pelvis with findings concerning for left abdominal wall abscess at the site of prior drainage catheter but no intra-abdominal abscess or diverticulitis. EDP has consulted IR (Dr. Loreta Ave), recommended keeping the patient n.p.o. after midnight.  IR will consult in the morning.  Continue Zosyn and pain management.  Follow-up blood cultures.  AKI on CKD stage IIIa BUN 33, creatinine 2.2 (baseline 1.4).  CT without evidence of kidney stones or hydronephrosis.  Showing an unchanged right renal cyst.  Continue gentle IV fluid hydration and monitor renal  function.  Avoid nephrotoxic agents/hold home chlorthalidone and valsartan.  QT prolongation QTc 550.  Monitor potassium and magnesium levels, replace as needed.  Cardiac monitoring and follow-up repeat EKG in the morning.  Avoid QT prolonging drugs.  Paroxysmal A-fib Currently in sinus rhythm.  Hold Xarelto until patient is evaluated by IR in the morning.  Hold amiodarone at this time given worsening QT prolongation on EKG, follow-up repeat EKG in the morning and resume amiodarone accordingly.  Chronic HFpEF Last echo done in March 2024 showing EF 50 to 55%, RV systolic function moderately reduced, mildly elevated pulmonary artery systolic pressure, trivial MVR.  No signs of volume overload at this time.  Check BNP.  History of CVA Hold Xarelto until patient is evaluated by IR in the morning.  Continue Lipitor.  Gout Continue allopurinol.  Hyperlipidemia Continue Lipitor.  CAD EKG without acute ischemic changes and patient denies chest pain.  History of DVT/PE in 2019 Hold Xarelto until patient is evaluated by IR in the morning.  No tachycardia, tachypnea, dyspnea, chest pain, or hypoxia at this time to suggest recurrent PE.  No clinical signs of DVT on exam.  Type 2 diabetes Glucose 121.  Last A1c 8.3 in March 2024, repeat ordered.  Placed on sensitive sliding scale insulin every 4 hours for now since patient is NPO after midnight.  Hold oral hypoglycemic agents at this time.  Hypertension Blood pressure was soft on arrival to the ED, now improved after 1 L IV fluids.  Hold antihypertensives at this time.  OSA/OHS Continue nightly CPAP.  DVT prophylaxis: SCDs Code Status: Full Code (discussed with the patient) Family Communication: No family available at this time. Consults called: IR Level of care: Telemetry bed Admission status: It is my clinical opinion that referral for OBSERVATION  is reasonable and necessary in this patient based on the above information provided. The  aforementioned taken together are felt to place the patient at high risk for further clinical deterioration. However, it is anticipated that the patient may be medically stable for discharge from the hospital within 24 to 48 hours.  John Giovanni MD [facility]  If 7PM-7AM, please contact night-coverage [email or web address]  04/27/2023, 11:20 PM

## 2023-04-28 ENCOUNTER — Observation Stay (HOSPITAL_COMMUNITY): Payer: Medicaid Other

## 2023-04-28 ENCOUNTER — Other Ambulatory Visit (HOSPITAL_COMMUNITY): Payer: Self-pay | Admitting: Radiology

## 2023-04-28 DIAGNOSIS — K632 Fistula of intestine: Secondary | ICD-10-CM | POA: Diagnosis not present

## 2023-04-28 DIAGNOSIS — M109 Gout, unspecified: Secondary | ICD-10-CM | POA: Diagnosis not present

## 2023-04-28 DIAGNOSIS — I480 Paroxysmal atrial fibrillation: Secondary | ICD-10-CM | POA: Diagnosis not present

## 2023-04-28 DIAGNOSIS — K828 Other specified diseases of gallbladder: Secondary | ICD-10-CM | POA: Diagnosis not present

## 2023-04-28 DIAGNOSIS — E1159 Type 2 diabetes mellitus with other circulatory complications: Secondary | ICD-10-CM

## 2023-04-28 DIAGNOSIS — I10 Essential (primary) hypertension: Secondary | ICD-10-CM | POA: Diagnosis not present

## 2023-04-28 DIAGNOSIS — K651 Peritoneal abscess: Secondary | ICD-10-CM | POA: Diagnosis not present

## 2023-04-28 DIAGNOSIS — N179 Acute kidney failure, unspecified: Secondary | ICD-10-CM | POA: Diagnosis not present

## 2023-04-28 DIAGNOSIS — R9431 Abnormal electrocardiogram [ECG] [EKG]: Secondary | ICD-10-CM | POA: Diagnosis not present

## 2023-04-28 DIAGNOSIS — Z7901 Long term (current) use of anticoagulants: Secondary | ICD-10-CM | POA: Diagnosis not present

## 2023-04-28 DIAGNOSIS — Z7984 Long term (current) use of oral hypoglycemic drugs: Secondary | ICD-10-CM | POA: Diagnosis not present

## 2023-04-28 DIAGNOSIS — I152 Hypertension secondary to endocrine disorders: Secondary | ICD-10-CM | POA: Diagnosis not present

## 2023-04-28 DIAGNOSIS — E662 Morbid (severe) obesity with alveolar hypoventilation: Secondary | ICD-10-CM | POA: Diagnosis not present

## 2023-04-28 DIAGNOSIS — Z86718 Personal history of other venous thrombosis and embolism: Secondary | ICD-10-CM | POA: Diagnosis not present

## 2023-04-28 DIAGNOSIS — Z79899 Other long term (current) drug therapy: Secondary | ICD-10-CM | POA: Diagnosis not present

## 2023-04-28 DIAGNOSIS — Z8673 Personal history of transient ischemic attack (TIA), and cerebral infarction without residual deficits: Secondary | ICD-10-CM | POA: Diagnosis not present

## 2023-04-28 DIAGNOSIS — R634 Abnormal weight loss: Secondary | ICD-10-CM | POA: Diagnosis not present

## 2023-04-28 DIAGNOSIS — K5730 Diverticulosis of large intestine without perforation or abscess without bleeding: Secondary | ICD-10-CM | POA: Diagnosis not present

## 2023-04-28 DIAGNOSIS — N1831 Chronic kidney disease, stage 3a: Secondary | ICD-10-CM | POA: Diagnosis not present

## 2023-04-28 DIAGNOSIS — Z888 Allergy status to other drugs, medicaments and biological substances status: Secondary | ICD-10-CM | POA: Diagnosis not present

## 2023-04-28 DIAGNOSIS — Z8249 Family history of ischemic heart disease and other diseases of the circulatory system: Secondary | ICD-10-CM | POA: Diagnosis not present

## 2023-04-28 DIAGNOSIS — I5022 Chronic systolic (congestive) heart failure: Secondary | ICD-10-CM | POA: Diagnosis not present

## 2023-04-28 DIAGNOSIS — K8020 Calculus of gallbladder without cholecystitis without obstruction: Secondary | ICD-10-CM | POA: Diagnosis not present

## 2023-04-28 DIAGNOSIS — Z6841 Body Mass Index (BMI) 40.0 and over, adult: Secondary | ICD-10-CM | POA: Diagnosis not present

## 2023-04-28 DIAGNOSIS — I272 Pulmonary hypertension, unspecified: Secondary | ICD-10-CM | POA: Diagnosis not present

## 2023-04-28 DIAGNOSIS — E785 Hyperlipidemia, unspecified: Secondary | ICD-10-CM | POA: Diagnosis not present

## 2023-04-28 DIAGNOSIS — I2510 Atherosclerotic heart disease of native coronary artery without angina pectoris: Secondary | ICD-10-CM | POA: Diagnosis not present

## 2023-04-28 DIAGNOSIS — L02211 Cutaneous abscess of abdominal wall: Secondary | ICD-10-CM | POA: Diagnosis not present

## 2023-04-28 DIAGNOSIS — Z86711 Personal history of pulmonary embolism: Secondary | ICD-10-CM | POA: Diagnosis not present

## 2023-04-28 DIAGNOSIS — I5032 Chronic diastolic (congestive) heart failure: Secondary | ICD-10-CM | POA: Diagnosis not present

## 2023-04-28 DIAGNOSIS — E1122 Type 2 diabetes mellitus with diabetic chronic kidney disease: Secondary | ICD-10-CM | POA: Diagnosis not present

## 2023-04-28 DIAGNOSIS — K5720 Diverticulitis of large intestine with perforation and abscess without bleeding: Secondary | ICD-10-CM | POA: Diagnosis not present

## 2023-04-28 DIAGNOSIS — Z01810 Encounter for preprocedural cardiovascular examination: Secondary | ICD-10-CM | POA: Diagnosis not present

## 2023-04-28 LAB — BASIC METABOLIC PANEL
Anion gap: 12 (ref 5–15)
BUN: 33 mg/dL — ABNORMAL HIGH (ref 8–23)
CO2: 28 mmol/L (ref 22–32)
Calcium: 8.6 mg/dL — ABNORMAL LOW (ref 8.9–10.3)
Chloride: 96 mmol/L — ABNORMAL LOW (ref 98–111)
Creatinine, Ser: 2.4 mg/dL — ABNORMAL HIGH (ref 0.61–1.24)
GFR, Estimated: 30 mL/min — ABNORMAL LOW (ref >60)
Glucose, Bld: 147 mg/dL — ABNORMAL HIGH (ref 70–99)
Potassium: 3.6 mmol/L (ref 3.5–5.1)
Sodium: 136 mmol/L (ref 135–145)

## 2023-04-28 LAB — HEMOGLOBIN A1C
Hgb A1c MFr Bld: 6.5 % — ABNORMAL HIGH (ref 4.8–5.6)
Mean Plasma Glucose: 139.85 mg/dL

## 2023-04-28 LAB — CBC
HCT: 35.1 % — ABNORMAL LOW (ref 39.0–52.0)
Hemoglobin: 11.4 g/dL — ABNORMAL LOW (ref 13.0–17.0)
MCH: 31 pg (ref 26.0–34.0)
MCHC: 32.5 g/dL (ref 30.0–36.0)
MCV: 95.4 fL (ref 80.0–100.0)
Platelets: 291 10*3/uL (ref 150–400)
RBC: 3.68 MIL/uL — ABNORMAL LOW (ref 4.22–5.81)
RDW: 14.5 % (ref 11.5–15.5)
WBC: 8.7 10*3/uL (ref 4.0–10.5)
nRBC: 0 % (ref 0.0–0.2)

## 2023-04-28 LAB — CBG MONITORING, ED
Glucose-Capillary: 123 mg/dL — ABNORMAL HIGH (ref 70–99)
Glucose-Capillary: 140 mg/dL — ABNORMAL HIGH (ref 70–99)
Glucose-Capillary: 156 mg/dL — ABNORMAL HIGH (ref 70–99)

## 2023-04-28 LAB — GLUCOSE, CAPILLARY
Glucose-Capillary: 139 mg/dL — ABNORMAL HIGH (ref 70–99)
Glucose-Capillary: 132 mg/dL — ABNORMAL HIGH (ref 70–99)

## 2023-04-28 LAB — BRAIN NATRIURETIC PEPTIDE: B Natriuretic Peptide: 30.3 pg/mL (ref 0.0–100.0)

## 2023-04-28 LAB — MAGNESIUM: Magnesium: 2.2 mg/dL (ref 1.7–2.4)

## 2023-04-28 MED ORDER — ENOXAPARIN SODIUM 80 MG/0.8ML IJ SOSY
80.0000 mg | PREFILLED_SYRINGE | INTRAMUSCULAR | Status: DC
  Administered 2023-04-28 – 2023-05-02 (×5): 80 mg via SUBCUTANEOUS
  Filled 2023-04-28 (×6): qty 0.8

## 2023-04-28 MED ORDER — KETOROLAC TROMETHAMINE 15 MG/ML IJ SOLN
15.0000 mg | Freq: Once | INTRAMUSCULAR | Status: AC
  Administered 2023-04-28: 15 mg via INTRAVENOUS
  Filled 2023-04-28: qty 1

## 2023-04-28 MED ORDER — FENTANYL CITRATE (PF) 100 MCG/2ML IJ SOLN
INTRAMUSCULAR | Status: AC | PRN
  Administered 2023-04-28: 25 ug via INTRAVENOUS
  Administered 2023-04-28: 50 ug via INTRAVENOUS

## 2023-04-28 MED ORDER — OXYCODONE-ACETAMINOPHEN 10-325 MG PO TABS: 1.0000 | ORAL_TABLET | Freq: Four times a day (QID) | ORAL | Status: DC | PRN

## 2023-04-28 MED ORDER — ACETAMINOPHEN 650 MG RE SUPP: 650.0000 mg | Freq: Four times a day (QID) | RECTAL | Status: DC | PRN

## 2023-04-28 MED ORDER — SODIUM CHLORIDE 0.9% FLUSH
5.0000 mL | Freq: Three times a day (TID) | INTRAVENOUS | Status: DC
  Administered 2023-04-28 – 2023-05-05 (×21): 5 mL

## 2023-04-28 MED ORDER — ATORVASTATIN CALCIUM 10 MG PO TABS
20.0000 mg | ORAL_TABLET | Freq: Every day | ORAL | Status: DC
  Administered 2023-04-28 – 2023-05-05 (×8): 20 mg via ORAL
  Filled 2023-04-28 (×8): qty 2

## 2023-04-28 MED ORDER — FENTANYL CITRATE (PF) 100 MCG/2ML IJ SOLN
INTRAMUSCULAR | Status: AC
  Filled 2023-04-28: qty 2

## 2023-04-28 MED ORDER — HYDROMORPHONE HCL 1 MG/ML IJ SOLN
0.5000 mg | INTRAMUSCULAR | Status: DC | PRN
  Administered 2023-04-28 – 2023-04-29 (×6): 0.5 mg via INTRAVENOUS
  Filled 2023-04-28 (×2): qty 1
  Filled 2023-04-28 (×2): qty 0.5
  Filled 2023-04-28: qty 1
  Filled 2023-04-28: qty 0.5

## 2023-04-28 MED ORDER — MIDAZOLAM HCL 2 MG/2ML IJ SOLN
INTRAMUSCULAR | Status: AC | PRN
  Administered 2023-04-28: 1 mg via INTRAVENOUS
  Administered 2023-04-28: .5 mg via INTRAVENOUS

## 2023-04-28 MED ORDER — OXYCODONE HCL 5 MG PO TABS
5.0000 mg | ORAL_TABLET | Freq: Four times a day (QID) | ORAL | Status: DC | PRN
  Administered 2023-04-28 – 2023-05-05 (×19): 5 mg via ORAL
  Filled 2023-04-28 (×19): qty 1

## 2023-04-28 MED ORDER — ALLOPURINOL 100 MG PO TABS
100.0000 mg | ORAL_TABLET | Freq: Every day | ORAL | Status: DC
  Administered 2023-04-28 – 2023-05-05 (×8): 100 mg via ORAL
  Filled 2023-04-28 (×8): qty 1

## 2023-04-28 MED ORDER — INSULIN ASPART 100 UNIT/ML IJ SOLN
0.0000 [IU] | Freq: Three times a day (TID) | INTRAMUSCULAR | Status: DC
  Administered 2023-04-29: 1 [IU] via SUBCUTANEOUS
  Administered 2023-04-29 (×2): 2 [IU] via SUBCUTANEOUS
  Administered 2023-04-30 – 2023-05-01 (×5): 1 [IU] via SUBCUTANEOUS
  Administered 2023-05-02: 2 [IU] via SUBCUTANEOUS
  Administered 2023-05-02: 1 [IU] via SUBCUTANEOUS
  Administered 2023-05-03: 3 [IU] via SUBCUTANEOUS
  Administered 2023-05-03: 1 [IU] via SUBCUTANEOUS
  Administered 2023-05-04: 3 [IU] via SUBCUTANEOUS
  Administered 2023-05-04 – 2023-05-05 (×2): 1 [IU] via SUBCUTANEOUS
  Administered 2023-05-05: 2 [IU] via SUBCUTANEOUS

## 2023-04-28 MED ORDER — OXYCODONE-ACETAMINOPHEN 5-325 MG PO TABS
1.0000 | ORAL_TABLET | Freq: Four times a day (QID) | ORAL | Status: DC | PRN
  Administered 2023-04-28 – 2023-05-05 (×19): 1 via ORAL
  Filled 2023-04-28 (×19): qty 1

## 2023-04-28 MED ORDER — MIDAZOLAM HCL 2 MG/2ML IJ SOLN
INTRAMUSCULAR | Status: AC
  Filled 2023-04-28: qty 2

## 2023-04-28 MED ORDER — NALOXONE HCL 0.4 MG/ML IJ SOLN: 0.4000 mg | INTRAMUSCULAR | Status: DC | PRN

## 2023-04-28 MED ORDER — INSULIN ASPART 100 UNIT/ML IJ SOLN
0.0000 [IU] | INTRAMUSCULAR | Status: DC
  Administered 2023-04-28 (×3): 1 [IU] via SUBCUTANEOUS

## 2023-04-28 MED ORDER — SODIUM CHLORIDE 0.9 % IV BOLUS
500.0000 mL | Freq: Once | INTRAVENOUS | Status: AC
  Administered 2023-04-28: 500 mL via INTRAVENOUS

## 2023-04-28 MED ORDER — ACETAMINOPHEN 325 MG PO TABS
650.0000 mg | ORAL_TABLET | Freq: Four times a day (QID) | ORAL | Status: DC | PRN
Start: 2023-04-28 — End: 2023-05-05

## 2023-04-28 MED ORDER — PIPERACILLIN-TAZOBACTAM 3.375 G IVPB
3.3750 g | Freq: Three times a day (TID) | INTRAVENOUS | Status: DC
  Administered 2023-04-28 – 2023-05-02 (×14): 3.375 g via INTRAVENOUS
  Filled 2023-04-28 (×14): qty 50

## 2023-04-28 MED ORDER — LIDOCAINE HCL (PF) 1 % IJ SOLN
10.0000 mL | Freq: Once | INTRAMUSCULAR | Status: AC
  Administered 2023-04-28: 10 mL

## 2023-04-28 MED ORDER — SODIUM CHLORIDE 0.9 % IV SOLN
INTRAVENOUS | Status: AC
Start: 2023-04-28 — End: 2023-04-28

## 2023-04-28 NOTE — ED Notes (Signed)
Patient will be transported to inpatient bed from IR.  IR notified

## 2023-04-28 NOTE — Progress Notes (Signed)
Patient requested to notify provider that he normally takes oxyCODONE-acetaminophen (PERCOCET) 10-325 MG tablet three times daily at home. He wants to be placed on his prn pain medication. Paged on call MD

## 2023-04-28 NOTE — Progress Notes (Signed)
PROGRESS NOTE    Bradley Torres  YNW:[id number] DOB: Apr 01, 1960 DOA: 04/27/2023 PCP: Mechele Claude, MD   Brief Narrative: Bradley Torres is a 63 y.o. male with a history of CAD, paroxysmal atrial fibrillation, diabetes mellitus type 2, gout, hyperlipidemia, hypertension, obesity class III, OSA, OHS, pulm hypertension, chronic heart failure, chronic back pain, CKD stage IIIa.  Patient presented secondary to an abdominal wall wound with feculent/purulent drainage and was found to have evidence of an abdominal wall abscess.  Empiric antibiotics started, blood cultures obtained, interventional radiology consulted.   Assessment/Plan:  Abdominal wall abscess Related to prior JP drain placement with history of fistula.  CT abdomen/pelvis this admission was significant for an abscess measuring 4.3 x 3.6 cm without clear evidence of a connection to the intra-abdominal contents/subadjacent bowel.  Empiric Zosyn started.  Interventional radiology was consulted with plan for drain placement. -Continue Zosyn IV -Follow-up IR recommendations -Follow-up wound culture data pending IR procedure -Follow-up blood cultures  AKI on CKD stage IIIa Patient's baseline creatinine is about 1.4.  Patient with a BUN/creatinine of 33/2.2 respectively on admission.  CT on admission without evidence of hydronephrosis.  Creatinine slightly worsened to 2.40 this morning. -Continue IV fluids -Repeat BMP in a.m.  Paroxysmal atrial fibrillation Currently in sinus rhythm.  Patient is on Xarelto, amiodarone as an outpatient.  Xarelto held secondary to anticipated procedure.  Amiodarone held on admission secondary to prolonged QTc.  QTc slightly decreased from admission this morning.  QTc prolongation Initial QTc measured to be 550 ms, currently down to 538 ms this morning. -Continue telemetry  Chronic heart failure with mildly reduced ejection fraction Stable.  Last LVEF of 50 to 55% with associated moderately  reduced RV systolic function.  Patient appears euvolemic at this time.  History of CVA Noted.  Patient on Xarelto as an outpatient which was held secondary to anticipated procedure.  Patient is also on Lipitor. -Continue Lipitor  Gout -Continue allopurinol  Hyperlipidemia -Continue Lipitor  CAD Noted.  Primary hypertension Patient is on hydralazine, chlorthalidone, valsartan as an outpatient.  Antihypertensives were held on admission.  Patient is currently normotensive with hypotension initially on presentation. -Continue to hold antihypertensives  Diabetes mellitus, type II Controlled with hemoglobin A1c of 6.5%.  Patient is managed on Januvia, metformin, Marcelline Deist as an outpatient.  SSI started on admission. -Continue SSI  History of DVT/PE Remote history from 2019.  Patient is on Xarelto as an outpatient which is held on admission secondary to anticipated procedure. -Resume Xarelto once cleared by interventional radiology  Obesity, class III Estimated body mass index is 47.47 kg/m as calculated from the following:   Height as of this encounter: 6' (1.829 m).   Weight as of this encounter: 158.8 kg.  OSA/OHS -Continue CPAP nightly   DVT prophylaxis: SCDs Code Status:   Code Status: Full Code Family Communication: None at bedside Disposition Plan: Discharge home likely in 2 to 4 days pending IR recommendation/management, and transition to outpatient antibiotics   Consultants:  Interventional radiology  Procedures:  None  Antimicrobials: Zosyn IV   Subjective: Patient reports no specific concerns at this time.  Objective: BP 115/62 (BP Location: Right Arm)   Pulse 72   Temp 98.4 F (36.9 C) (Oral)   Resp (!) 21   Ht 6' (1.829 m)   Wt (!) 158.8 kg   SpO2 (!) 19%   BMI 47.47 kg/m   Examination:  General exam: Appears calm and comfortable Respiratory system: Clear to auscultation. Respiratory  effort normal. Cardiovascular system: S1 & S2 heard, RRR.  No murmurs Gastrointestinal system: Abdomen is nondistended, soft and nontender.  Clean dressing intact.  Normal bowel sounds heard. Central nervous system: Alert and oriented. No focal neurological deficits. Musculoskeletal: No calf tenderness Psychiatry: Judgement and insight appear normal. Mood & affect appropriate.    Data Reviewed: I have personally reviewed following labs and imaging studies   Last CBC Lab Results  Component Value Date   WBC 8.7 04/28/2023   HGB 11.4 (L) 04/28/2023   HCT 35.1 (L) 04/28/2023   MCV 95.4 04/28/2023   MCH 31.0 04/28/2023   RDW 14.5 04/28/2023   PLT 291 04/28/2023     Last metabolic panel Lab Results  Component Value Date   GLUCOSE 147 (H) 04/28/2023   NA 136 04/28/2023   K 3.6 04/28/2023   CL 96 (L) 04/28/2023   CO2 28 04/28/2023   BUN 33 (H) 04/28/2023   CREATININE 2.40 (H) 04/28/2023   GFRNONAA 30 (L) 04/28/2023   CALCIUM 8.6 (L) 04/28/2023   PHOS 3.4 06/19/2022   PROT 7.9 04/27/2023   ALBUMIN 3.1 (L) 04/27/2023   LABGLOB 3.3 02/21/2023   AGRATIO 1.2 08/02/2022   BILITOT 0.7 04/27/2023   ALKPHOS 95 04/27/2023   AST 22 04/27/2023   ALT 30 04/27/2023   ANIONGAP 12 04/28/2023     Creatinine Clearance: Estimated Creatinine Clearance: 50.3 mL/min (A) (by C-G formula based on SCr of 2.4 mg/dL (H)).  Recent Results (from the past 240 hours)  Blood culture (routine x 2)     Status: None (Preliminary result)   Collection Time: 04/27/23  5:10 PM   Specimen: BLOOD  Result Value Ref Range Status   Specimen Description BLOOD SITE NOT SPECIFIED  Final   Special Requests   Final    BOTTLES DRAWN AEROBIC AND ANAEROBIC Blood Culture results may not be optimal due to an inadequate volume of blood received in culture bottles   Culture   Final    NO GROWTH < 24 HOURS Performed at [facility], [address] [place], [place] [address]    Report Status PENDING  Incomplete  Blood culture (routine x 2)     Status: None (Preliminary  result)   Collection Time: 04/27/23 10:30 PM   Specimen: BLOOD RIGHT HAND  Result Value Ref Range Status   Specimen Description BLOOD RIGHT HAND  Final   Special Requests   Final    BOTTLES DRAWN AEROBIC AND ANAEROBIC Blood Culture results may not be optimal due to an inadequate volume of blood received in culture bottles   Culture   Final    NO GROWTH < 12 HOURS Performed at [facility], [address], [place], [place] [address]    Report Status PENDING  Incomplete      Radiology Studies: CT ABDOMEN PELVIS W CONTRAST Result Date: 04/27/2023 CLINICAL DATA:  Diverticulitis, complication suspected known fisula and abscess Patient reports increased drainage from left lower quadrant operative site. Drain removed 3 months ago. EXAM: CT ABDOMEN AND PELVIS WITH CONTRAST TECHNIQUE: Multidetector CT imaging of the abdomen and pelvis was performed using the standard protocol following bolus administration of intravenous contrast. RADIATION DOSE REDUCTION: This exam was performed according to the departmental dose-optimization program which includes automated exposure control, adjustment of the mA and/or kV according to patient size and/or use of iterative reconstruction technique. CONTRAST:  75mL OMNIPAQUE IOHEXOL 350 MG/ML SOLN COMPARISON:  CT 10/26/2022 FINDINGS: Lower chest: No basilar airspace  disease or pleural effusion. Hepatobiliary: Scattered hepatic hypodensities are too small to characterize but unchanged from prior exam, likely cysts. Moderate gallbladder distension. Small calcified gallstones. No pericholecystic inflammation. No biliary dilatation. Pancreas: No ductal dilatation or inflammation. Spleen: Normal in size without focal abnormality.  Small splenule. Adrenals/Urinary Tract: Normal adrenal glands. No hydronephrosis or perinephric edema. Homogeneous renal enhancement. Lobulated renal contours. Unchanged right renal cyst. No further follow-up imaging is recommended. Urinary bladder  is physiologically distended without wall thickening. Stomach/Bowel: Unremarkable appearance of the stomach. No small bowel obstruction or inflammation. The appendix is normal. Colonic diverticulosis without acute diverticulitis. Vascular/Lymphatic: Aortic atherosclerosis. No aneurysm. The portal vein is patent. Prominent mesenteric nodes measuring up to 10 mm. This is nonspecific. Reproductive: Prostate is unremarkable. Other: At site of prior drainage catheter there is a left lower quadrant tract extending from the skin surface to the abdominal wall. In the deep subcutaneous tissues is a area of soft tissue density with internal air measuring 4.3 x 3.6 cm. Only minimal internal fluid. Surrounding fat stranding and inflammation. This does not definitively connect to the intra-abdominal contents or subjacent bowel. No free air or ascites. Musculoskeletal: L4-L5 fusion hardware. There are no acute or suspicious osseous abnormalities. IMPRESSION: 1. Heterogeneous collection in the left abdominal wall at site of prior drainage catheter. Tract extends from the skin surface to the abdominal wall. In the deep subcutaneous tissues is a area of soft tissue density with internal air measuring 4.3 x 3.6 cm. Only minimal internal fluid. Surrounding fat stranding and inflammation. This does not definitively connect to the intra-abdominal contents or subjacent bowel. 2. Colonic diverticulosis without acute diverticulitis. 3. Cholelithiasis. Electronically Signed   By: Narda Rutherford M.D.   On: 04/27/2023 20:48      LOS: 0 days    Jacquelin Hawking, MD [facility] 04/28/2023, 9:42 AM   If 7PM-7AM, please contact night-coverage [email or web address]

## 2023-04-28 NOTE — ED Notes (Signed)
 Pt transported to IR

## 2023-04-28 NOTE — ED Notes (Signed)
Pt ambulating to bathroom without assistance

## 2023-04-28 NOTE — Plan of Care (Signed)
  Problem: Nutritional: Goal: Maintenance of adequate nutrition will improve Outcome: Completed/Met   Problem: Clinical Measurements: Goal: Ability to maintain clinical measurements within normal limits will improve Outcome: Completed/Met

## 2023-04-28 NOTE — ED Notes (Signed)
Pt remains with IR

## 2023-04-28 NOTE — Consult Note (Signed)
Chief Complaint: Intra abdominal abscess. Request is for intra abdominal abscess drain placement   Referring Physician(s): Dr. John Giovanni  Supervising Physician: Gilmer Mor  Patient Status: [facility] - ED  History of Present Illness: Bradley Torres is a 63 y.o. male outpatient (in the ED).Known to IR. History of CVA, MI, HTN, DVT, CAD, diverticulitis. IR placed a LLQ abscess into a diverticular abscess with fistula on 7.6.24 removed on 11.22.24. Patient presented to the ED at [facility] on 1.30.25 with drainage from the prior IR drain site. CT Abd pelvis from 1.30.25 reads Heterogeneous collection in the left abdominal wall at site of prior drainage catheter. Tract extends from the skin surface to the abdominal wall. In the deep subcutaneous tissues is a area of soft tissue density with internal air measuring 4.3 x 3.6 cm. Only minimal internal fluid. Surrounding fat stranding and inflammation. This does not definitively connect to the intra-abdominal contents or subjacent bowel. Team is requesting an intra abdominal abscess drain placement. Case approved by IR Attending Dr. Gilmer Mor.  Patient alert and laying in bed,calm. Endorses generalized malaise. Denies any fevers, headache, chest pain, SOB, cough, abdominal pain, nausea, vomiting or bleeding.    No leukocytosis, BUN 33, Cr 2.4, GFR 30. Last dose of xarelto on 1.30.25. See list of applicable allergies. Patient has been NPO since midnight.  Return precautions and treatment recommendations and follow-up discussed with the patient  who is agreeable with the plan.    Past Medical History:  Diagnosis Date   CAD (coronary artery disease)    a. 12/2017: cath showing 10% Proximal-LAD stenosis with no significant obstructive disease and LVEDP mildly elevated at 18 mm Hg.    Chronic back pain    Diabetes mellitus, type 2 (HCC)    DVT (deep venous thrombosis) (HCC)    Essential hypertension    Family hx of colon cancer 05/03/2017    MI (myocardial infarction) (HCC) 2019   Morbid obesity (HCC)    Panic disorder    Shoulder pain, left    Following fall   Sleep apnea    Stroke (HCC) 01/2018   Vitamin D deficiency     Past Surgical History:  Procedure Laterality Date   Ankle fracture sugery Left    BIOPSY  07/20/2020   Procedure: BIOPSY;  Surgeon: Corbin Ade, MD;  [facility]: [facility];  Service: Endoscopy;;  cecal polyp   COLONOSCOPY WITH PROPOFOL N/A 05/29/2017   Dr. Jena Gauss: Multiple tubular adenomas removed, prep inadequate.  Short interval surveillance colonoscopy recommended in 6 months   COLONOSCOPY WITH PROPOFOL N/A 07/20/2020   Procedure: COLONOSCOPY WITH PROPOFOL;  Surgeon: Corbin Ade, MD;  Location: [facility];  Service: Endoscopy;  Laterality: N/A;  am appt, early as possible since diabetic   INTERVENTIONAL RADIOLOGY PROCEDURE  10/08/2022   JP drain placement due to diverticular abscess   IR CATHETER TUBE CHANGE  01/16/2023   IR RADIOLOGIST EVAL & MGMT  10/26/2022   IR RADIOLOGIST EVAL & MGMT  01/04/2023   IR RADIOLOGIST EVAL & MGMT  01/30/2023   IR RADIOLOGIST EVAL & MGMT  02/17/2023   LEFT HEART CATH AND CORONARY ANGIOGRAPHY N/A 01/23/2018   Procedure: LEFT HEART CATH AND CORONARY ANGIOGRAPHY;  Surgeon: Lennette Bihari, MD;  Location: [facility];  Service: Cardiovascular;  Laterality: N/A;   LUMBAR SPINE SURGERY     fusion   POLYPECTOMY  05/29/2017   Procedure: POLYPECTOMY;  Surgeon: Corbin Ade, MD;  Location:  [facility];  Service: Endoscopy;;  ascending colon polyp x5-cs transverse colon polyp x4- cs descending colon polyp x1- hs sigmoid colon polyp x1 -hs rectal polyp x1- hs   TONSILLECTOMY      Allergies: Aldactone [spironolactone], Bystolic [nebivolol hcl], Motrin [ibuprofen], Actos [pioglitazone], Trulicity [dulaglutide], Zestril [lisinopril], Cozaar [losartan], Diflucan [fluconazole], Hctz [hydrochlorothiazide], and Norvasc [amlodipine]  Medications: Prior to  Admission medications   Medication Sig Start Date End Date Taking? Authorizing Provider  allopurinol (ZYLOPRIM) 100 MG tablet TAKE ONE TABLET BY MOUTH ONCE DAILY 04/04/23  Yes Stacks, Broadus John, MD  amiodarone (PACERONE) 200 MG tablet Take 1 tablet (200 mg total) by mouth daily. 01/12/23  Yes Antoine Poche, MD  atorvastatin (LIPITOR) 20 MG tablet TAKE ONE TABLET BY MOUTH ONCE DAILY 08/01/22  Yes Branch, Dorothe Pea, MD  chlorthalidone (HYGROTON) 25 MG tablet TAKE 1 TABLET BY MOUTH EVERY OTHER DAY 01/20/23  Yes Sharlene Dory, NP  Cholecalciferol (VITAMIN D-3 PO) Take 1 tablet by mouth daily.   Yes [provider]  dapagliflozin propanediol (FARXIGA) 10 MG TABS tablet TAKE ONE TABLET BY MOUTH DAILY BEFORE BREAKFAST 04/05/23  Yes Stacks, Broadus John, MD  docusate sodium (COLACE) 100 MG capsule Take 1 capsule (100 mg total) by mouth 2 (two) times daily. Patient taking differently: Take 100 mg by mouth 2 (two) times daily as needed (constipation.). 06/28/22  Yes Osvaldo Shipper, MD  hydrALAZINE (APRESOLINE) 25 MG tablet Take 1 tablet (25 mg total) by mouth daily. 09/08/22  Yes Sharlene Dory, NP  JANUVIA 100 MG tablet TAKE ONE TABLET BY MOUTH ONCE DAILY 04/05/23  Yes Mechele Claude, MD  metFORMIN (GLUCOPHAGE-XR) 750 MG 24 hr tablet Take 2 tablets (1,500 mg total) by mouth daily with breakfast. Patient taking differently: Take 750 mg by mouth daily with breakfast. 05/11/22  Yes Mechele Claude, MD  oxyCODONE-acetaminophen (PERCOCET) 10-325 MG tablet Take 1 tablet by mouth 3 (three) times daily as needed for pain. Patient taking differently: Take 1 tablet by mouth every 6 (six) hours as needed for pain. 06/28/22  Yes Osvaldo Shipper, MD  polyethylene glycol (MIRALAX / GLYCOLAX) 17 g packet Take 17 g by mouth daily. Patient taking differently: Take 17 g by mouth daily as needed (constipation.). 06/28/22  Yes Osvaldo Shipper, MD  Prasterone, DHEA, (DHEA PO) Take 1 capsule by mouth daily.   Yes [provider]  rivaroxaban (XARELTO) 20 MG TABS tablet TAKE ONE TABLET BY MOUTH DAILY WITH SUPPER 03/20/23  Yes Stacks, Broadus John, MD  senna-docusate (SENOKOT-S) 8.6-50 MG tablet Take 2 tablets by mouth at bedtime. Patient taking differently: Take 2 tablets by mouth at bedtime as needed (constipation). 06/28/22  Yes Osvaldo Shipper, MD  valsartan (DIOVAN) 160 MG tablet TAKE 1 TABLET BY MOUTH EVERY DAY 03/15/23  Yes Mechele Claude, MD  Accu-Chek Softclix Lancets lancets Use as instructed to monitor glucose twice daily 09/23/21   Dani Gobble, NP  glucose blood (ACCU-CHEK GUIDE) test strip Use as instructed to monitor glucose twice daily 10/28/21   Dani Gobble, NP  sodium chloride flush 0.9 % SOLN injection Flush your drain with 3-5 mL of 0.9% normal every day Patient not taking: Reported on 04/27/2023 12/28/22   Lynnette Caffey A, PA-C  testosterone cypionate (DEPOTESTOSTERONE CYPIONATE) 200 MG/ML injection Inject 200 mg into the muscle every 14 (fourteen) days. Patient not taking: Reported on 04/27/2023 07/30/21   [provider]     Family History  Problem Relation Age of Onset   Colon cancer Mother  died at age 31   Heart attack Father    Hepatitis Sister 20       Autoimmune   Heart attack Paternal Grandfather     Social History   Socioeconomic History   Marital status: Single    Spouse name: Not on file   Number of children: Not on file   Years of education: Not on file   Highest education level: Not on file  Occupational History   Occupation: disabled  Tobacco Use   Smoking status: Never    Passive exposure: Never   Smokeless tobacco: Never  Vaping Use   Vaping status: Never Used  Substance and Sexual Activity   Alcohol use: Yes    Comment: occ   Drug use: Not Currently    Types: Marijuana    Comment: daily; 05/19/20 no marijuana in past 6 months d/t going to pain management clinic   Sexual activity: Not Currently    Birth control/protection: None  Other  Topics Concern   Not on file  Social History Narrative   Not on file   Social Drivers of Health   Financial Resource Strain: Medium Risk (05/18/2020)   Overall Financial Resource Strain (CARDIA)    Difficulty of Paying Living Expenses: Somewhat hard  Food Insecurity: No Food Insecurity (10/01/2022)   Hunger Vital Sign    Worried About Running Out of Food in the Last Year: Never true    Ran Out of Food in the Last Year: Never true  Transportation Needs: No Transportation Needs (10/01/2022)   PRAPARE - Administrator, Civil Service (Medical): No    Lack of Transportation (Non-Medical): No  Physical Activity: Not on file  Stress: Stress Concern Present (05/15/2019)   [employer] of Occupational Health - Occupational Stress Questionnaire    Feeling of Stress : Rather much  Social Connections: Unknown (01/16/2020)   Social Connection and Isolation Panel [NHANES]    Frequency of Communication with Friends and Family: More than three times a week    Frequency of Social Gatherings with Friends and Family: More than three times a week    Attends Religious Services: Not on Marketing executive or Organizations: Not on file    Attends Banker Meetings: Not on file    Marital Status: Not on file     Review of Systems: A 12 point ROS discussed and pertinent positives are indicated in the HPI above.  All other systems are negative.  Review of Systems  Constitutional:  Negative for fever.  HENT:  Negative for congestion.   Respiratory:  Negative for cough and shortness of breath.   Cardiovascular:  Negative for chest pain.  Gastrointestinal:  Negative for abdominal pain.  Musculoskeletal:  Positive for myalgias (generalized whole body).  Neurological:  Negative for headaches.  Psychiatric/Behavioral:  Negative for behavioral problems and confusion.     Vital Signs: BP 115/62 (BP Location: Right Arm)   Pulse 72   Temp 98.4 F (36.9 C) (Oral)   Resp  (!) 21   Ht 6' (1.829 m)   Wt (!) 350 lb (158.8 kg)   SpO2 (!) 19%   BMI 47.47 kg/m     Physical Exam Vitals and nursing note reviewed.  Constitutional:      Appearance: He is well-developed. He is obese.  HENT:     Head: Normocephalic.  Cardiovascular:     Rate and Rhythm: Normal rate and regular rhythm.  Pulmonary:  Effort: Pulmonary effort is normal.  Musculoskeletal:        General: Normal range of motion.     Cervical back: Normal range of motion.  Skin:    General: Skin is dry.  Neurological:     General: No focal deficit present.     Mental Status: He is alert and oriented to person, place, and time. Mental status is at baseline.  Psychiatric:        Mood and Affect: Mood normal.        Behavior: Behavior normal.        Thought Content: Thought content normal.        Judgment: Judgment normal.     Imaging: CT ABDOMEN PELVIS W CONTRAST Result Date: 04/27/2023 CLINICAL DATA:  Diverticulitis, complication suspected known fisula and abscess Patient reports increased drainage from left lower quadrant operative site. Drain removed 3 months ago. EXAM: CT ABDOMEN AND PELVIS WITH CONTRAST TECHNIQUE: Multidetector CT imaging of the abdomen and pelvis was performed using the standard protocol following bolus administration of intravenous contrast. RADIATION DOSE REDUCTION: This exam was performed according to the departmental dose-optimization program which includes automated exposure control, adjustment of the mA and/or kV according to patient size and/or use of iterative reconstruction technique. CONTRAST:  75mL OMNIPAQUE IOHEXOL 350 MG/ML SOLN COMPARISON:  CT 10/26/2022 FINDINGS: Lower chest: No basilar airspace disease or pleural effusion. Hepatobiliary: Scattered hepatic hypodensities are too small to characterize but unchanged from prior exam, likely cysts. Moderate gallbladder distension. Small calcified gallstones. No pericholecystic inflammation. No biliary dilatation.  Pancreas: No ductal dilatation or inflammation. Spleen: Normal in size without focal abnormality.  Small splenule. Adrenals/Urinary Tract: Normal adrenal glands. No hydronephrosis or perinephric edema. Homogeneous renal enhancement. Lobulated renal contours. Unchanged right renal cyst. No further follow-up imaging is recommended. Urinary bladder is physiologically distended without wall thickening. Stomach/Bowel: Unremarkable appearance of the stomach. No small bowel obstruction or inflammation. The appendix is normal. Colonic diverticulosis without acute diverticulitis. Vascular/Lymphatic: Aortic atherosclerosis. No aneurysm. The portal vein is patent. Prominent mesenteric nodes measuring up to 10 mm. This is nonspecific. Reproductive: Prostate is unremarkable. Other: At site of prior drainage catheter there is a left lower quadrant tract extending from the skin surface to the abdominal wall. In the deep subcutaneous tissues is a area of soft tissue density with internal air measuring 4.3 x 3.6 cm. Only minimal internal fluid. Surrounding fat stranding and inflammation. This does not definitively connect to the intra-abdominal contents or subjacent bowel. No free air or ascites. Musculoskeletal: L4-L5 fusion hardware. There are no acute or suspicious osseous abnormalities. IMPRESSION: 1. Heterogeneous collection in the left abdominal wall at site of prior drainage catheter. Tract extends from the skin surface to the abdominal wall. In the deep subcutaneous tissues is a area of soft tissue density with internal air measuring 4.3 x 3.6 cm. Only minimal internal fluid. Surrounding fat stranding and inflammation. This does not definitively connect to the intra-abdominal contents or subjacent bowel. 2. Colonic diverticulosis without acute diverticulitis. 3. Cholelithiasis. Electronically Signed   By: Narda Rutherford M.D.   On: 04/27/2023 20:48    Labs:  CBC: Recent Labs    10/06/22 0419 02/21/23 1544  04/27/23 1810 04/28/23 0102  WBC 10.2 6.6 7.9 8.7  HGB 12.1* 12.9* 13.0 11.4*  HCT 38.6* 40.6 39.7 35.1*  PLT 408* 315 373 291    COAGS: No results for input(s): "INR", "APTT" in the last 8760 hours.  BMP: Recent Labs    10/05/22 0403  10/06/22 0419 02/21/23 1544 04/27/23 1810 04/28/23 0102  NA 133* 133* 142 137 136  K 4.0 4.2 4.2 3.6 3.6  CL 96* 96* 102 96* 96*  CO2 29 29 28 26 28   GLUCOSE 174* 103* 233* 121* 147*  BUN 17 17 18  33* 33*  CALCIUM 8.5* 8.7* 9.3 9.4 8.6*  CREATININE 1.46* 1.45* 1.47* 2.24* 2.40*  GFRNONAA 55* 55*  --  33* 30*    LIVER FUNCTION TESTS: Recent Labs    08/02/22 1608 10/01/22 0829 02/21/23 1544 04/27/23 1810  BILITOT 0.8 1.0 0.3 0.7  AST 19 13* 11 22  ALT 18 10 11 30   ALKPHOS 79 56 93 95  PROT 7.2 7.1 7.1 7.9  ALBUMIN 3.9 2.9* 3.8* 3.1*      Assessment and Plan:  63 y.o. male outpatient (in the ED).Known to IR. History of CVA, MI, HTN, DVT, CAD, diverticulitis. IR placed a LLQ abscess into a diverticular abscess with fistula on 7.6.24 removed on 11.22.24. Patient presented to [facility] ED at [facility] on 1.30.25 with drainage from the prior IR drain site. CT Abd pelvis from 1.30.25 reads Heterogeneous collection in the left abdominal wall at site of prior drainage catheter. Tract extends from the skin surface to the abdominal wall. In the deep subcutaneous tissues is a area of soft tissue density with internal air measuring 4.3 x 3.6 cm. Only minimal internal fluid. Surrounding fat stranding and inflammation. This does not definitively connect to the intra-abdominal contents or subjacent bowel. Team is requesting an intra abdominal abscess drain placement. Case approved by IR Attending Dr. Gilmer Mor.  PLAN: IR Image Guided Intra Abdominal Abscess Drain Placement  Risks and benefits discussed with the patient including bleeding, infection, damage to adjacent structures, bowel perforation/fistula connection, and sepsis.  All of the patient's  questions were answered, patient is agreeable to proceed. Consent signed and in IR Consent Box   Thank you for this interesting consult.  I greatly enjoyed meeting Bradley Torres and look forward to participating in their care.  A copy of this report was sent to the requesting provider on this date.  Electronically Signed: Alene Mires, NP 04/28/2023, 9:20 AM   I spent a total of 40 Minutes    in face to face in clinical consultation, greater than 50% of which was counseling/coordinating care for intra abdominal abscess drain placement

## 2023-04-28 NOTE — Progress Notes (Signed)
Pharmacy Antibiotic Note  Bradley Torres is a 63 y.o. male admitted on 04/27/2023 with left abdominal wall abscess at the site of prior drainage catheter.  Pharmacy has been consulted for Zosyn dosing.  Plan: Zosyn 3.375g IV q8h (4 hour infusion).  Height: 6' (182.9 cm) Weight: (!) 158.8 kg (350 lb) IBW/kg (Calculated) : 77.6  Temp (24hrs), Avg:97.9 F (36.6 C), Min:97.5 F (36.4 C), Max:98.5 F (36.9 C)  Recent Labs  Lab 04/27/23 1810 04/27/23 1825 04/27/23 2020  WBC 7.9  --   --   CREATININE 2.24*  --   --   LATICACIDVEN  --  3.5* 2.1*    Estimated Creatinine Clearance: 53.9 mL/min (A) (by C-G formula based on SCr of 2.24 mg/dL (H)).    Allergies  Allergen Reactions   Aldactone [Spironolactone] Hives   Bystolic [Nebivolol Hcl] Swelling   Motrin [Ibuprofen] Other (See Comments)    Rectal bleed   Actos [Pioglitazone] Other (See Comments) and Cough    Dyspnea  Edema   Trulicity [Dulaglutide] Other (See Comments) and Cough    Shortness of breath GI intolerance   Zestril [Lisinopril] Other (See Comments) and Cough    Wheezing    Cozaar [Losartan] Rash   Diflucan [Fluconazole] Rash   Hctz [Hydrochlorothiazide] Other (See Comments)    Gout flares.   Norvasc [Amlodipine] Itching and Other (See Comments)    Myalgias     Thank you for allowing pharmacy to be a part of this patient's care.  Vernard Gambles, PharmD, BCPS  04/28/2023 12:35 AM

## 2023-04-28 NOTE — Procedures (Signed)
Interventional Radiology Procedure Note  Procedure:   CT drainage of LLQ recurrent abscess. New 46F drain.  Findings: The more superficial abscess looks to have mostly decompressed via the skin tract, which occurred during the case.  ~30cc of frankly purulent material aspirated for culture  Complications: None Recommendations:  - To bulb suction - Do not submerge - Routine drain care   Signed,  Yvone Neu. Loreta Ave, DO, ABVM, RPVI

## 2023-04-29 DIAGNOSIS — K632 Fistula of intestine: Secondary | ICD-10-CM | POA: Diagnosis not present

## 2023-04-29 DIAGNOSIS — K651 Peritoneal abscess: Secondary | ICD-10-CM | POA: Diagnosis not present

## 2023-04-29 DIAGNOSIS — I152 Hypertension secondary to endocrine disorders: Secondary | ICD-10-CM | POA: Diagnosis not present

## 2023-04-29 DIAGNOSIS — Z01810 Encounter for preprocedural cardiovascular examination: Secondary | ICD-10-CM | POA: Diagnosis not present

## 2023-04-29 DIAGNOSIS — E1159 Type 2 diabetes mellitus with other circulatory complications: Secondary | ICD-10-CM | POA: Diagnosis not present

## 2023-04-29 DIAGNOSIS — L02211 Cutaneous abscess of abdominal wall: Secondary | ICD-10-CM | POA: Diagnosis not present

## 2023-04-29 DIAGNOSIS — K5732 Diverticulitis of large intestine without perforation or abscess without bleeding: Secondary | ICD-10-CM | POA: Diagnosis not present

## 2023-04-29 DIAGNOSIS — I480 Paroxysmal atrial fibrillation: Secondary | ICD-10-CM | POA: Diagnosis not present

## 2023-04-29 LAB — BASIC METABOLIC PANEL
Anion gap: 11 (ref 5–15)
BUN: 33 mg/dL — ABNORMAL HIGH (ref 8–23)
CO2: 26 mmol/L (ref 22–32)
Calcium: 8.3 mg/dL — ABNORMAL LOW (ref 8.9–10.3)
Chloride: 97 mmol/L — ABNORMAL LOW (ref 98–111)
Creatinine, Ser: 2.35 mg/dL — ABNORMAL HIGH (ref 0.61–1.24)
GFR, Estimated: 31 mL/min — ABNORMAL LOW (ref >60)
Glucose, Bld: 199 mg/dL — ABNORMAL HIGH (ref 70–99)
Potassium: 3.5 mmol/L (ref 3.5–5.1)
Sodium: 134 mmol/L — ABNORMAL LOW (ref 135–145)

## 2023-04-29 LAB — GLUCOSE, CAPILLARY
Glucose-Capillary: 122 mg/dL — ABNORMAL HIGH (ref 70–99)
Glucose-Capillary: 159 mg/dL — ABNORMAL HIGH (ref 70–99)
Glucose-Capillary: 156 mg/dL — ABNORMAL HIGH (ref 70–99)
Glucose-Capillary: 135 mg/dL — ABNORMAL HIGH (ref 70–99)

## 2023-04-29 LAB — CBC
HCT: 32.3 % — ABNORMAL LOW (ref 39.0–52.0)
Hemoglobin: 10.6 g/dL — ABNORMAL LOW (ref 13.0–17.0)
MCH: 31.1 pg (ref 26.0–34.0)
MCHC: 32.8 g/dL (ref 30.0–36.0)
MCV: 94.7 fL (ref 80.0–100.0)
Platelets: 284 10*3/uL (ref 150–400)
RBC: 3.41 MIL/uL — ABNORMAL LOW (ref 4.22–5.81)
RDW: 14.6 % (ref 11.5–15.5)
WBC: 7.3 10*3/uL (ref 4.0–10.5)
nRBC: 0 % (ref 0.0–0.2)

## 2023-04-29 LAB — BRAIN NATRIURETIC PEPTIDE: B Natriuretic Peptide: 71.9 pg/mL (ref 0.0–100.0)

## 2023-04-29 MED ORDER — POLYETHYLENE GLYCOL 3350 17 G PO PACK
17.0000 g | PACK | Freq: Every day | ORAL | Status: DC
  Administered 2023-04-29 – 2023-05-04 (×5): 17 g via ORAL
  Filled 2023-04-29 (×7): qty 1

## 2023-04-29 MED ORDER — SODIUM CHLORIDE 0.9 % IV SOLN: INTRAVENOUS | Status: DC

## 2023-04-29 NOTE — Progress Notes (Signed)
   04/29/23 0023  BiPAP/CPAP/SIPAP  Reason BIPAP/CPAP not in use Non-compliant (Patient states he does not wear cpap and does not want to)

## 2023-04-29 NOTE — Consult Note (Signed)
Cardiology Consultation   Patient ID: Bradley Torres MRN: [id number]; DOB: 1960/05/30  Admit date: 04/27/2023 Date of Consult: 04/29/2023  PCP:  Mechele Claude, MD   [facility] Providers Cardiologist:  Dina Rich, MD        Patient Profile:   Bradley Torres is a 63 y.o. male with a hx of paroxysmal atrial fibrillation, heart failure with preserved ejection fraction, cerebrovascular accident, hyperlipidemia, coronary artery disease, deep vein thrombosis in 2019 on Xarelto who is being seen 04/29/2023 for the evaluation of pre-surgical evaluation at the request of the Hospitalist service.  History of Present Illness:   Mr. Emerick Torres has a history of a perforated sigmoid diverticulitis with prior abscess and JP drain placed in July, 2024.  His JP drain was removed in November, however he presented on 1/30 for purulent drainage at the JP drain site.  He presented in sinus rhythm.  CT abdomen/pelvis revealed abdominal wall abscess and a chronic colocutaneous fistula.  General surgery was consulted, who recommended, recommended surgical intervention.  Cardiology was consulted for cardiovascular risk assessment.  Konrad Dolores follows up with [facility]. [facility] for management of paroxysmal atrial fibrillation and hypertension.  He was most recently seen on 12/23/2018 for for scheduled follow-up.  He was doing well during this visit.  He has a CHA2DS2-VASc score of 4 and is on rivaroxaban for stroke reduction, and amiodarone for antiarrhythmic control.  He denied chest pain, shortness of breath, palpitations, leg swelling.  His most recent echocardiogram in 05/2022 showed a normal left ventricular function, normal right ventricular systolic function, right ventricular systolic pressure of .  Jeffrey had a prior left heart catheterization in 2019 which showed 10% proximal LAD stenosis and no obstruction of the left circumflex or right coronary artery.  His other  comorbid conditions include stroke, which was diagnosed in 2019.  He also states he has obstructive sleep apnea, but does not use a CPAP machine.  Past Medical History:  Diagnosis Date   CAD (coronary artery disease)    a. 12/2017: cath showing 10% Proximal-LAD stenosis with no significant obstructive disease and LVEDP mildly elevated at 18 mm Hg.    Chronic back pain    Diabetes mellitus, type 2 (HCC)    DVT (deep venous thrombosis) (HCC)    Essential hypertension    Family hx of colon cancer 05/03/2017   MI (myocardial infarction) (HCC) 2019   Morbid obesity (HCC)    Panic disorder    Shoulder pain, left    Following fall   Sleep apnea    Stroke (HCC) 01/2018   Vitamin D deficiency     Past Surgical History:  Procedure Laterality Date   Ankle fracture sugery Left    BIOPSY  07/20/2020   Procedure: BIOPSY;  Surgeon: Corbin Ade, MD;  Location: [facility];  Service: Endoscopy;;  cecal polyp   COLONOSCOPY WITH PROPOFOL N/A 05/29/2017   Dr. Jena Gauss: Multiple tubular adenomas removed, prep inadequate.  Short interval surveillance colonoscopy recommended in 6 months   COLONOSCOPY WITH PROPOFOL N/A 07/20/2020   Procedure: COLONOSCOPY WITH PROPOFOL;  Surgeon: Corbin Ade, MD;  Location: [facility];  Service: Endoscopy;  Laterality: N/A;  am appt, early as possible since diabetic   INTERVENTIONAL RADIOLOGY PROCEDURE  10/08/2022   JP drain placement due to diverticular abscess   IR CATHETER TUBE CHANGE  01/16/2023   IR RADIOLOGIST EVAL & MGMT  10/26/2022   IR RADIOLOGIST EVAL &  MGMT  01/04/2023   IR RADIOLOGIST EVAL & MGMT  01/30/2023   IR RADIOLOGIST EVAL & MGMT  02/17/2023   LEFT HEART CATH AND CORONARY ANGIOGRAPHY N/A 01/23/2018   Procedure: LEFT HEART CATH AND CORONARY ANGIOGRAPHY;  Surgeon: Lennette Bihari, MD;  Location: [facility];  Service: Cardiovascular;  Laterality: N/A;   LUMBAR SPINE SURGERY     fusion   POLYPECTOMY  05/29/2017   Procedure: POLYPECTOMY;   Surgeon: Corbin Ade, MD;  Location: [facility];  Service: Endoscopy;;  ascending colon polyp x5-cs transverse colon polyp x4- cs descending colon polyp x1- hs sigmoid colon polyp x1 -hs rectal polyp x1- hs   TONSILLECTOMY       Home Medications:  Prior to Admission medications   Medication Sig Start Date End Date Taking? Authorizing Provider  allopurinol (ZYLOPRIM) 100 MG tablet TAKE ONE TABLET BY MOUTH ONCE DAILY 04/04/23  Yes Stacks, Broadus John, MD  amiodarone (PACERONE) 200 MG tablet Take 1 tablet (200 mg total) by mouth daily. 01/12/23  Yes Antoine Poche, MD  atorvastatin (LIPITOR) 20 MG tablet TAKE ONE TABLET BY MOUTH ONCE DAILY 08/01/22  Yes Branch, Dorothe Pea, MD  chlorthalidone (HYGROTON) 25 MG tablet TAKE 1 TABLET BY MOUTH EVERY OTHER DAY 01/20/23  Yes Sharlene Dory, NP  Cholecalciferol (VITAMIN D-3 PO) Take 1 tablet by mouth daily.   Yes [provider]  dapagliflozin propanediol (FARXIGA) 10 MG TABS tablet TAKE ONE TABLET BY MOUTH DAILY BEFORE BREAKFAST 04/05/23  Yes Stacks, Broadus John, MD  docusate sodium (COLACE) 100 MG capsule Take 1 capsule (100 mg total) by mouth 2 (two) times daily. Patient taking differently: Take 100 mg by mouth 2 (two) times daily as needed (constipation.). 06/28/22  Yes Osvaldo Shipper, MD  hydrALAZINE (APRESOLINE) 25 MG tablet Take 1 tablet (25 mg total) by mouth daily. 09/08/22  Yes Sharlene Dory, NP  JANUVIA 100 MG tablet TAKE ONE TABLET BY MOUTH ONCE DAILY 04/05/23  Yes Mechele Claude, MD  metFORMIN (GLUCOPHAGE-XR) 750 MG 24 hr tablet Take 2 tablets (1,500 mg total) by mouth daily with breakfast. Patient taking differently: Take 750 mg by mouth daily with breakfast. 05/11/22  Yes Mechele Claude, MD  oxyCODONE-acetaminophen (PERCOCET) 10-325 MG tablet Take 1 tablet by mouth 3 (three) times daily as needed for pain. Patient taking differently: Take 1 tablet by mouth every 6 (six) hours as needed for pain. 06/28/22  Yes Osvaldo Shipper, MD   polyethylene glycol (MIRALAX / GLYCOLAX) 17 g packet Take 17 g by mouth daily. Patient taking differently: Take 17 g by mouth daily as needed (constipation.). 06/28/22  Yes Osvaldo Shipper, MD  Prasterone, DHEA, (DHEA PO) Take 1 capsule by mouth daily.   Yes [provider]  rivaroxaban (XARELTO) 20 MG TABS tablet TAKE ONE TABLET BY MOUTH DAILY WITH SUPPER 03/20/23  Yes Stacks, Broadus John, MD  senna-docusate (SENOKOT-S) 8.6-50 MG tablet Take 2 tablets by mouth at bedtime. Patient taking differently: Take 2 tablets by mouth at bedtime as needed (constipation). 06/28/22  Yes Osvaldo Shipper, MD  valsartan (DIOVAN) 160 MG tablet TAKE 1 TABLET BY MOUTH EVERY DAY 03/15/23  Yes Mechele Claude, MD  Accu-Chek Softclix Lancets lancets Use as instructed to monitor glucose twice daily 09/23/21   Dani Gobble, NP  glucose blood (ACCU-CHEK GUIDE) test strip Use as instructed to monitor glucose twice daily 10/28/21   Dani Gobble, NP    Inpatient Medications: Scheduled Meds:  allopurinol  100 mg Oral Daily  atorvastatin  20 mg Oral Daily   enoxaparin (LOVENOX) injection  80 mg Subcutaneous Q24H   insulin aspart  0-9 Units Subcutaneous TID WC   polyethylene glycol  17 g Oral Daily   sodium chloride flush  5 mL Intracatheter Q8H   Continuous Infusions:  sodium chloride 40 mL/hr at 04/29/23 1053   piperacillin-tazobactam (ZOSYN)  IV 3.375 g (04/29/23 1600)   PRN Meds: acetaminophen **OR** acetaminophen, naLOXone (NARCAN)  injection, oxyCODONE-acetaminophen **AND** oxyCODONE  Allergies:    Allergies  Allergen Reactions   Aldactone [Spironolactone] Hives   Bystolic [Nebivolol Hcl] Swelling   Motrin [Ibuprofen] Other (See Comments)    Rectal bleed   Actos [Pioglitazone] Other (See Comments) and Cough    Dyspnea  Edema   Trulicity [Dulaglutide] Other (See Comments) and Cough    Shortness of breath GI intolerance   Zestril [Lisinopril] Other (See Comments) and Cough    Wheezing     Cozaar [Losartan] Rash   Diflucan [Fluconazole] Rash   Hctz [Hydrochlorothiazide] Other (See Comments)    Gout flares.   Norvasc [Amlodipine] Itching and Other (See Comments)    Myalgias     Social History:   Social History   Socioeconomic History   Marital status: Single    Spouse name: Not on file   Number of children: Not on file   Years of education: Not on file   Highest education level: Not on file  Occupational History   Occupation: disabled  Tobacco Use   Smoking status: Never    Passive exposure: Never   Smokeless tobacco: Never  Vaping Use   Vaping status: Never Used  Substance and Sexual Activity   Alcohol use: Yes    Comment: occ   Drug use: Not Currently    Types: Marijuana    Comment: daily; 05/19/20 no marijuana in past 6 months d/t going to pain management clinic   Sexual activity: Not Currently    Birth control/protection: None  Other Topics Concern   Not on file  Social History Narrative   Not on file   Social Drivers of Health   Financial Resource Strain: Medium Risk (05/18/2020)   Overall Financial Resource Strain (CARDIA)    Difficulty of Paying Living Expenses: Somewhat hard  Food Insecurity: No Food Insecurity (04/28/2023)   Hunger Vital Sign    Worried About Running Out of Food in the Last Year: Never true    Ran Out of Food in the Last Year: Never true  Transportation Needs: No Transportation Needs (04/28/2023)   PRAPARE - Administrator, Civil Service (Medical): No    Lack of Transportation (Non-Medical): No  Physical Activity: Not on file  Stress: Stress Concern Present (05/15/2019)   [employer] of Occupational Health - Occupational Stress Questionnaire    Feeling of Stress : Rather much  Social Connections: Unknown (01/16/2020)   Social Connection and Isolation Panel [NHANES]    Frequency of Communication with Friends and Family: More than three times a week    Frequency of Social Gatherings with Friends and Family:  More than three times a week    Attends Religious Services: Not on file    Active Member of Clubs or Organizations: Not on file    Attends Banker Meetings: Not on file    Marital Status: Not on file  Intimate Partner Violence: Not At Risk (04/28/2023)   Humiliation, Afraid, Rape, and Kick questionnaire    Fear of Current or Ex-Partner: No  Emotionally Abused: No    Physically Abused: No    Sexually Abused: No    Family History:    Family History  Problem Relation Age of Onset   Colon cancer Mother        died at age 86   Heart attack Father    Hepatitis Sister 74       Autoimmune   Heart attack Paternal Grandfather      ROS:  Please see the history of present illness.   All other ROS reviewed and negative.     Physical Exam/Data:   Vitals:   04/28/23 2323 04/29/23 0345 04/29/23 0815 04/29/23 1558  BP: (!) 141/69 118/72 114/75 119/72  Pulse: 88 80 78 67  Resp: 16 16 18 18   Temp: 98.6 F (37 C) 98.2 F (36.8 C) 98.1 F (36.7 C) 98.2 F (36.8 C)  TempSrc: Oral Oral    SpO2: 98% 98% 99% 98%  Weight:      Height:        Intake/Output Summary (Last 24 hours) at 04/29/2023 1759 Last data filed at 04/29/2023 1700 Gross per 24 hour  Intake 1933.58 ml  Output 63 ml  Net 1870.58 ml      04/27/2023    5:02 PM 02/21/2023    3:42 PM 02/07/2023    3:14 PM  Last 3 Weights  Weight (lbs) 350 lb 347 lb 9.6 oz 337 lb  Weight (kg) 158.759 kg 157.67 kg 152.862 kg     Body mass index is 47.47 kg/m.  General:  Well nourished, well developed, in no acute distress HEENT: normal Neck: no JVD Vascular: No carotid bruits; Distal pulses 2+ bilaterally Cardiac:  normal S1, S2; RRR; no murmur  Lungs:  clear to auscultation bilaterally, no wheezing, rhonchi or rales  Ext: no edema Musculoskeletal:  No deformities, BUE and BLE strength normal and equal Skin: warm and dry  Neuro:  CNs 2-12 intact, no focal abnormalities noted Psych:  Normal affect   EKG:  The EKG  was personally reviewed and demonstrates:  sinus rhythm Telemetry:  Telemetry was personally reviewed and demonstrates:  sinus rhythm  Relevant CV Studies:  TTE 06/21/2022  IMPRESSIONS     1. Left ventricular ejection fraction, by estimation, is 50 to 55% -  difficuly evaluation in the setting of rapid atrial fibrillation and  variable contraction from beat to beat. There is intermittent septal  bounce in association with respiration. The  left ventricle has low normal function. The left ventricle demonstrates  global hypokinesis. Left ventricular diastolic parameters are  indeterminate.   2. Right ventricular systolic function is moderately reduced. The right  ventricular size is mildly enlarged. There is mildly elevated pulmonary  artery systolic pressure. The estimated right ventricular systolic  pressure is 42.2 mmHg.   3. Left atrial size was mildly dilated.   4. The mitral valve is grossly normal. Trivial mitral valve  regurgitation.   5. The aortic valve is tricuspid. Aortic valve regurgitation is not  visualized.   6. The inferior vena cava is dilated in size with <50% respiratory  variability, suggesting right atrial pressure of 15 mmHg.   Comparison(s): Prior images reviewed side by side. Difficult to compare  since patient currently in rapid atrial fibrillation. Consider follow-up  limited study once back in sinus rhythm.   FINDINGS   Left Ventricle: Left ventricular ejection fraction, by estimation, is 50  to 55%. The left ventricle has low normal function. The left ventricle  demonstrates global  hypokinesis. Definity contrast agent was given IV to  delineate the left ventricular  endocardial borders. The left ventricular internal cavity size was normal  in size. There is no left ventricular hypertrophy. Left ventricular  diastolic function could not be evaluated due to atrial fibrillation. Left  ventricular diastolic parameters are  indeterminate.   Right  Ventricle: The right ventricular size is mildly enlarged. No  increase in right ventricular wall thickness. Right ventricular systolic  function is moderately reduced. There is mildly elevated pulmonary artery  systolic pressure. The tricuspid  regurgitant velocity is 2.61 m/s, and with an assumed right atrial  pressure of 15 mmHg, the estimated right ventricular systolic pressure is  42.2 mmHg.   Left Atrium: Left atrial size was mildly dilated.   Right Atrium: Right atrial size was normal in size.   Pericardium: There is no evidence of pericardial effusion.   Mitral Valve: The mitral valve is grossly normal. Trivial mitral valve  regurgitation.   Tricuspid Valve: The tricuspid valve is grossly normal. Tricuspid valve  regurgitation is mild.   Aortic Valve: The aortic valve is tricuspid. There is mild aortic valve  annular calcification. Aortic valve regurgitation is not visualized.   Pulmonic Valve: The pulmonic valve was grossly normal. Pulmonic valve  regurgitation is trivial.   Aorta: The aortic root is normal in size and structure.   Venous: The inferior vena cava is dilated in size with less than 50%  respiratory variability, suggesting right atrial pressure of 15 mmHg.   IAS/Shunts: No atrial level shunt detected by color flow Doppler.   Left heart catheterization 01/23/2018   Prox LAD lesion is 10% stenosed. The left ventricular ejection fraction is 50-55% by visual estimate. LV end diastolic pressure is mildly elevated. The left ventricular systolic function is normal.   No significant coronary obstructive disease with essentially normal coronary arteries and only mild luminal irregularity of the proximal LAD of 10%; normal left circumflex and normal dominant RCA.   Low normal global LV function with an ejection fraction of 50 to 55% without definitive focal segmental wall motion abnormalities.  LVEDP is 18 mmHg.   RECOMMENDATION: Medical therapy.  Weight loss  is essential.  The patient should be evaluated for obstructive sleep apnea.  Laboratory Data:  High Sensitivity Troponin:  No results for input(s): "TROPONINIHS" in the last 720 hours.   Chemistry Recent Labs  Lab 04/27/23 1810 04/28/23 0102 04/29/23 0543  NA 137 136 134*  K 3.6 3.6 3.5  CL 96* 96* 97*  CO2 26 28 26   GLUCOSE 121* 147* 199*  BUN 33* 33* 33*  CREATININE 2.24* 2.40* 2.35*  CALCIUM 9.4 8.6* 8.3*  MG  --  2.2  --   GFRNONAA 33* 30* 31*  ANIONGAP 15 12 11     Recent Labs  Lab 04/27/23 1810  PROT 7.9  ALBUMIN 3.1*  AST 22  ALT 30  ALKPHOS 95  BILITOT 0.7   Lipids No results for input(s): "CHOL", "TRIG", "HDL", "LABVLDL", "LDLCALC", "CHOLHDL" in the last 168 hours.  Hematology Recent Labs  Lab 04/27/23 1810 04/28/23 0102 04/29/23 0543  WBC 7.9 8.7 7.3  RBC 4.20* 3.68* 3.41*  HGB 13.0 11.4* 10.6*  HCT 39.7 35.1* 32.3*  MCV 94.5 95.4 94.7  MCH 31.0 31.0 31.1  MCHC 32.7 32.5 32.8  RDW 14.4 14.5 14.6  PLT 373 291 284   Thyroid No results for input(s): "TSH", "FREET4" in the last 168 hours.  BNP Recent Labs  Lab 04/28/23 0102  BNP 30.3    DDimer No results for input(s): "DDIMER" in the last 168 hours.   Radiology/Studies:  CT GUIDED PERITONEAL/RETROPERITONEAL FLUID DRAIN BY PERC CATH Result Date: 04/28/2023 INDICATION: 63 year old male with a history of diverticular abscess, prior drainage. He presents emergency department with drainage through a skin tract. CT demonstrates fluid collection adjacent to the colon concerning for recurrent abscess. In addition he was small fluid collection in the anterior abdominal wall. EXAM: CT-GUIDED DRAINAGE OF INTRA-ABDOMINAL ABSCESS TECHNIQUE: Multidetector CT imaging of the abdomen was performed following the standard protocol without IV contrast. RADIATION DOSE REDUCTION: This exam was performed according to the departmental dose-optimization program which includes automated exposure control, adjustment of the mA  and/or kV according to patient size and/or use of iterative reconstruction technique. MEDICATIONS: The patient is currently admitted to the hospital and receiving intravenous antibiotics. The antibiotics were administered within an appropriate time frame prior to the initiation of the procedure. ANESTHESIA/SEDATION: Moderate (conscious) sedation was employed during this procedure. A total of Versed 1.5 mg and Fentanyl 75 mcg was administered intravenously by the radiology nurse. Total intra-service moderate Sedation Time: 24 minutes. The patient's level of consciousness and vital signs were monitored continuously by radiology nursing throughout the procedure under my direct supervision. COMPLICATIONS: None PROCEDURE: Informed written consent was obtained from the patient after a thorough discussion of the procedural risks, benefits and alternatives. All questions were addressed. Maximal Sterile Barrier Technique was utilized including caps, mask, sterile gowns, sterile gloves, sterile drape, hand hygiene and skin antiseptic. A timeout was performed prior to the initiation of the procedure. Patient was positioned supine on the CT gantry table. Scout CT acquired for planning purposes. The patient is prepped and draped in the usual sterile fashion. One sent lidocaine was used for local anesthesia. Using CT guidance, a trocar needle was advanced from an anterior approach through the fluid collection of the anterior abdominal wall in into the pericolonic abscess. At the same time, an oblique approach with a Yueh needle was used to approach the anterior abdominal wall fluid collection. During the course of the study, the majority of the fluid in the anterior abdominal wall abscess decompressed itself through the lesion in the skin. By the completion, there was no significant fluid 4 attempted drainage placement or aspiration in the anterior abdominal wall. Once the trocar needle was in position within the abdominal  abscess, modified Seldinger technique was used to place a 10 [place] drain. A proximally 30 cc of frankly purulent material aspirated. Final CT was acquired demonstrating decreased fluid in the anterior abdominal wall, decreased fluid in the abscess in the abdomen. The drain was sutured in position attached to bulb suction. Patient tolerated the procedure well and remained hemodynamically stable throughout. No complications were encountered and no significant blood loss. IMPRESSION: Status post CT-guided drainage of abdominal abscess adjacent to the left colon. During the course of the exam the majority of fluid in the anterior abdominal wall collection decompressed through the skin. No drain was attempted at this location. Signed, Yvone Neu. Miachel Roux, RPVI [facility] Electronically Signed   By: Gilmer Mor D.O.   On: 04/28/2023 15:57   CT ABDOMEN PELVIS W CONTRAST Result Date: 04/27/2023 CLINICAL DATA:  Diverticulitis, complication suspected known fisula and abscess Patient reports increased drainage from left lower quadrant operative site. Drain removed 3 months ago. EXAM: CT ABDOMEN AND PELVIS WITH CONTRAST TECHNIQUE: Multidetector CT imaging of the abdomen and pelvis was performed  using the standard protocol following bolus administration of intravenous contrast. RADIATION DOSE REDUCTION: This exam was performed according to the departmental dose-optimization program which includes automated exposure control, adjustment of the mA and/or kV according to patient size and/or use of iterative reconstruction technique. CONTRAST:  75mL OMNIPAQUE IOHEXOL 350 MG/ML SOLN COMPARISON:  CT 10/26/2022 FINDINGS: Lower chest: No basilar airspace disease or pleural effusion. Hepatobiliary: Scattered hepatic hypodensities are too small to characterize but unchanged from prior exam, likely cysts. Moderate gallbladder distension. Small calcified gallstones. No  pericholecystic inflammation. No biliary dilatation. Pancreas: No ductal dilatation or inflammation. Spleen: Normal in size without focal abnormality.  Small splenule. Adrenals/Urinary Tract: Normal adrenal glands. No hydronephrosis or perinephric edema. Homogeneous renal enhancement. Lobulated renal contours. Unchanged right renal cyst. No further follow-up imaging is recommended. Urinary bladder is physiologically distended without wall thickening. Stomach/Bowel: Unremarkable appearance of the stomach. No small bowel obstruction or inflammation. The appendix is normal. Colonic diverticulosis without acute diverticulitis. Vascular/Lymphatic: Aortic atherosclerosis. No aneurysm. The portal vein is patent. Prominent mesenteric nodes measuring up to 10 mm. This is nonspecific. Reproductive: Prostate is unremarkable. Other: At site of prior drainage catheter there is a left lower quadrant tract extending from the skin surface to the abdominal wall. In the deep subcutaneous tissues is a area of soft tissue density with internal air measuring 4.3 x 3.6 cm. Only minimal internal fluid. Surrounding fat stranding and inflammation. This does not definitively connect to the intra-abdominal contents or subjacent bowel. No free air or ascites. Musculoskeletal: L4-L5 fusion hardware. There are no acute or suspicious osseous abnormalities. IMPRESSION: 1. Heterogeneous collection in the left abdominal wall at site of prior drainage catheter. Tract extends from the skin surface to the abdominal wall. In the deep subcutaneous tissues is a area of soft tissue density with internal air measuring 4.3 x 3.6 cm. Only minimal internal fluid. Surrounding fat stranding and inflammation. This does not definitively connect to the intra-abdominal contents or subjacent bowel. 2. Colonic diverticulosis without acute diverticulitis. 3. Cholelithiasis. Electronically Signed   By: Narda Rutherford M.D.   On: 04/27/2023 20:48     Assessment and  Plan:   JARQUAVIOUS FENTRESS is a 63 y.o. male with a hx of paroxysmal atrial fibrillation, heart failure with preserved ejection fraction, cerebrovascular accident, hyperlipidemia, coronary artery disease, deep vein thrombosis in 2019 on Xarelto who is being seen 04/29/2023 for the evaluation of pre-surgical evaluation at the request of the Hospitalist service.  Ivan has several comorbid conditions that increases overall perioperative cardiovascular risk.  This includes prior stroke, untreated obstructive sleep apnea, elevated right systolic ventricular pressures (concerning for pulmonary hypertension) and morbid obesity.  Due to Cordarrell's prior stroke, it is difficult to assess his metabolic equivalents, however he does state he has periods where is able to walk for at least 30 minutes without any cardiac symptoms.  His revised cardiac risk index score is 2, which correlates to a 10.1% risk of major cardiac events.    I discussed his case with general surgery.  Reassuringly, he does not require urgent surgery.  We will need to consider whether stress testing or coronary angiography would be the next step in assessment of ischemic heart disease.  He does require percutaneous intervention with a drug-eluting stent, he would need to be on dual antiplatelet therapy.  This would delay his surgery.  Furthermore, Jaivian is on rivaroxaban for stroke reduction for atrial fibrillation.  His CHADS2 Vascor is 4.  He does not require heparin bridge  prior to surgery.    #Recommendations -BNP ordered -will discuss utility of further testing to assess for coronary artery disease -okay to restart home amiodarone   Risk Assessment/Risk Scores:          CHA2DS2-VASc Score = 4   This indicates a 4.8% annual risk of stroke. The patient's score is based upon: CHF History: 0 HTN History: 1 Diabetes History: 0 Stroke History: 2 Vascular Disease History: 1 Age Score: 0 Gender Score: 0         For questions  or updates, please contact [facility] Please consult [email or web address] for contact info under    Signed, Donavan Burnet, MD  04/29/2023 5:59 PM

## 2023-04-29 NOTE — Consult Note (Signed)
Bradley Torres 27-Sep-1960  [id number].    Requesting MD: Jacquelin Hawking Chief Complaint/Reason for Consult: colonic fistula  HPI:  63 yo male had diverticulitis with abscess in 08/2022. He was managed with a drain and subsequently developed a fistula. At the time he weight about 450 pounds with multiple medical problems so never underwent surgery. 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 3 days ago he had worsening pain and a large drainage from the fistula area of thick brown liquid. He came to the ED and was diagnosed with an intraabdominal abscess as well as abscess of the subcutaneous area above the muscle.  He had known some friends who had a colostomy and saw them have trouble with pouching and leakage.  ROS: Review of Systems  Constitutional:  Positive for weight loss.  HENT: Negative.    Eyes: Negative.   Respiratory: Negative.    Cardiovascular: Negative.   Gastrointestinal:  Positive for abdominal pain.  Genitourinary: Negative.   Musculoskeletal: Negative.   Skin: Negative.   Neurological: Negative.   Endo/Heme/Allergies: Negative.   Psychiatric/Behavioral: Negative.      Family History  Problem Relation Age of Onset   Colon cancer Mother        died at age 61   Heart attack Father    Hepatitis Sister 26       Autoimmune   Heart attack Paternal Grandfather     Past Medical History:  Diagnosis Date   CAD (coronary artery disease)    a. 12/2017: cath showing 10% Proximal-LAD stenosis with no significant obstructive disease and LVEDP mildly elevated at 18 mm Hg.    Chronic back pain    Diabetes mellitus, type 2 (HCC)    DVT (deep venous thrombosis) (HCC)    Essential hypertension    Family hx of colon cancer 05/03/2017   MI (myocardial infarction) (HCC) 2019   Morbid obesity (HCC)    Panic disorder    Shoulder pain, left    Following fall   Sleep apnea    Stroke (HCC) 01/2018    Vitamin D deficiency     Past Surgical History:  Procedure Laterality Date   Ankle fracture sugery Left    BIOPSY  07/20/2020   Procedure: BIOPSY;  Surgeon: Corbin Ade, MD;  [facility];  Service: Endoscopy;;  cecal polyp   COLONOSCOPY WITH PROPOFOL N/A 05/29/2017   Dr. Jena Gauss: Multiple tubular adenomas removed, prep inadequate.  Short interval surveillance colonoscopy recommended in 6 months   COLONOSCOPY WITH PROPOFOL N/A 07/20/2020   Procedure: COLONOSCOPY WITH PROPOFOL;  Surgeon: Corbin Ade, MD;  [facility]: [facility];  Service: Endoscopy;  Laterality: N/A;  am appt, early as possible since diabetic   INTERVENTIONAL RADIOLOGY PROCEDURE  10/08/2022   JP drain placement due to diverticular abscess   IR CATHETER TUBE CHANGE  01/16/2023   IR RADIOLOGIST EVAL & MGMT  10/26/2022   IR RADIOLOGIST EVAL & MGMT  01/04/2023   IR RADIOLOGIST EVAL & MGMT  01/30/2023   IR RADIOLOGIST EVAL & MGMT  02/17/2023   LEFT HEART CATH AND CORONARY ANGIOGRAPHY N/A 01/23/2018   Procedure: LEFT HEART CATH AND CORONARY ANGIOGRAPHY;  Surgeon: Lennette Bihari, MD;  [facility]: [facility];  Service: Cardiovascular;  Laterality: N/A;   LUMBAR SPINE SURGERY     fusion   POLYPECTOMY  05/29/2017   Procedure: POLYPECTOMY;  Surgeon:  Rourk, Gerrit Friends, MD;  Location: [facility];  Service: Endoscopy;;  ascending colon polyp x5-cs transverse colon polyp x4- cs descending colon polyp x1- hs sigmoid colon polyp x1 -hs rectal polyp x1- hs   TONSILLECTOMY      Social History:  reports that he has never smoked. He has never been exposed to tobacco smoke. He has never used smokeless tobacco. He reports current alcohol use. He reports that he does not currently use drugs after having used the following drugs: Marijuana.  Allergies:  Allergies  Allergen Reactions   Aldactone [Spironolactone] Hives   Bystolic [Nebivolol Hcl] Swelling   Motrin [Ibuprofen] Other (See Comments)    Rectal bleed    Actos [Pioglitazone] Other (See Comments) and Cough    Dyspnea  Edema   Trulicity [Dulaglutide] Other (See Comments) and Cough    Shortness of breath GI intolerance   Zestril [Lisinopril] Other (See Comments) and Cough    Wheezing    Cozaar [Losartan] Rash   Diflucan [Fluconazole] Rash   Hctz [Hydrochlorothiazide] Other (See Comments)    Gout flares.   Norvasc [Amlodipine] Itching and Other (See Comments)    Myalgias     Medications Prior to Admission  Medication Sig Dispense Refill   allopurinol (ZYLOPRIM) 100 MG tablet TAKE ONE TABLET BY MOUTH ONCE DAILY 90 tablet 0   amiodarone (PACERONE) 200 MG tablet Take 1 tablet (200 mg total) by mouth daily. 90 tablet 1   atorvastatin (LIPITOR) 20 MG tablet TAKE ONE TABLET BY MOUTH ONCE DAILY 90 tablet 3   chlorthalidone (HYGROTON) 25 MG tablet TAKE 1 TABLET BY MOUTH EVERY OTHER DAY 45 tablet 1   Cholecalciferol (VITAMIN D-3 PO) Take 1 tablet by mouth daily.     dapagliflozin propanediol (FARXIGA) 10 MG TABS tablet TAKE ONE TABLET BY MOUTH DAILY BEFORE BREAKFAST 90 tablet 0   docusate sodium (COLACE) 100 MG capsule Take 1 capsule (100 mg total) by mouth 2 (two) times daily. (Patient taking differently: Take 100 mg by mouth 2 (two) times daily as needed (constipation.).) 60 capsule 0   hydrALAZINE (APRESOLINE) 25 MG tablet Take 1 tablet (25 mg total) by mouth daily. 90 tablet 2   JANUVIA 100 MG tablet TAKE ONE TABLET BY MOUTH ONCE DAILY 90 tablet 0   metFORMIN (GLUCOPHAGE-XR) 750 MG 24 hr tablet Take 2 tablets (1,500 mg total) by mouth daily with breakfast. (Patient taking differently: Take 750 mg by mouth daily with breakfast.) 180 tablet 1   oxyCODONE-acetaminophen (PERCOCET) 10-325 MG tablet Take 1 tablet by mouth 3 (three) times daily as needed for pain. (Patient taking differently: Take 1 tablet by mouth every 6 (six) hours as needed for pain.) 30 tablet 0   polyethylene glycol (MIRALAX / GLYCOLAX) 17 g packet Take 17 g by mouth daily.  (Patient taking differently: Take 17 g by mouth daily as needed (constipation.).) 30 each 0   Prasterone, DHEA, (DHEA PO) Take 1 capsule by mouth daily.     rivaroxaban (XARELTO) 20 MG TABS tablet TAKE ONE TABLET BY MOUTH DAILY WITH SUPPER 90 tablet 0   senna-docusate (SENOKOT-S) 8.6-50 MG tablet Take 2 tablets by mouth at bedtime. (Patient taking differently: Take 2 tablets by mouth at bedtime as needed (constipation).) 120 tablet 0   valsartan (DIOVAN) 160 MG tablet TAKE 1 TABLET BY MOUTH EVERY DAY 90 tablet 3   Accu-Chek Softclix Lancets lancets Use as instructed to monitor glucose twice daily 100 each 12   glucose blood (ACCU-CHEK GUIDE) test  strip Use as instructed to monitor glucose twice daily 100 each 12    Physical Exam: Blood pressure 119/72, pulse 67, temperature 98.2 F (36.8 C), resp. rate 18, height 6' (1.829 m), weight (!) 158.8 kg, SpO2 98%. Gen: NAD Resp: nonlabored CV: RRR Abd: soft, left IR drain with brown thick output, 2 cm opening over the left mid abdomen with similar appearing brown thick output Neuro: AOx4  Results for orders placed or performed during the hospital encounter of 04/27/23 (from the past 48 hours)  CBC with Differential     Status: Abnormal   Collection Time: 04/27/23  6:10 PM  Result Value Ref Range   WBC 7.9 4.0 - 10.5 K/uL   RBC 4.20 (L) 4.22 - 5.81 MIL/uL   Hemoglobin 13.0 13.0 - 17.0 g/dL   HCT 19.1 47.8 - 29.5 %   MCV 94.5 80.0 - 100.0 fL   MCH 31.0 26.0 - 34.0 pg   MCHC 32.7 30.0 - 36.0 g/dL   RDW 62.1 30.8 - 65.7 %   Platelets 373 150 - 400 K/uL   nRBC 0.0 0.0 - 0.2 %   Neutrophils Relative % 70 %   Neutro Abs 5.6 1.7 - 7.7 K/uL   Lymphocytes Relative 9 %   Lymphs Abs 0.7 0.7 - 4.0 K/uL   Monocytes Relative 17 %   Monocytes Absolute 1.3 (H) 0.1 - 1.0 K/uL   Eosinophils Relative 1 %   Eosinophils Absolute 0.1 0.0 - 0.5 K/uL   Basophils Relative 1 %   Basophils Absolute 0.1 0.0 - 0.1 K/uL   Immature Granulocytes 2 %   Abs  Immature Granulocytes 0.13 (H) 0.00 - 0.07 K/uL    Comment: Performed at [facility], [address], [place], [place] [address]  Comprehensive metabolic panel     Status: Abnormal   Collection Time: 04/27/23  6:10 PM  Result Value Ref Range   Sodium 137 135 - 145 mmol/L   Potassium 3.6 3.5 - 5.1 mmol/L   Chloride 96 (L) 98 - 111 mmol/L   CO2 26 22 - 32 mmol/L   Glucose, Bld 121 (H) 70 - 99 mg/dL    Comment: Glucose reference range applies only to samples taken after fasting for at least 8 hours.   BUN 33 (H) 8 - 23 mg/dL   Creatinine, Ser 2.95 (H) 0.61 - 1.24 mg/dL   Calcium 9.4 8.9 - 28.4 mg/dL   Total Protein 7.9 6.5 - 8.1 g/dL   Albumin 3.1 (L) 3.5 - 5.0 g/dL   AST 22 15 - 41 U/L   ALT 30 0 - 44 U/L   Alkaline Phosphatase 95 38 - 126 U/L   Total Bilirubin 0.7 0.0 - 1.2 mg/dL   GFR, Estimated 33 (L) >60 mL/min    Comment: (NOTE) Calculated using the CKD-EPI Creatinine Equation (2021)    Anion gap 15 5 - 15    Comment: Performed at [facility], [address], [place], [place] [address]  I-Stat CG4 Lactic Acid     Status: Abnormal   Collection Time: 04/27/23  6:25 PM  Result Value Ref Range   Lactic Acid, Venous 3.5 (HH) 0.5 - 1.9 mmol/L   Comment NOTIFIED PHYSICIAN   I-Stat CG4 Lactic Acid     Status: Abnormal   Collection Time: 04/27/23  8:20 PM  Result Value Ref Range   Lactic Acid, Venous 2.1 (HH) 0.5 - 1.9 mmol/L   Comment NOTIFIED PHYSICIAN   Blood culture (  routine x 2)     Status: None (Preliminary result)   Collection Time: 04/27/23 10:30 PM   Specimen: BLOOD RIGHT HAND  Result Value Ref Range   Specimen Description BLOOD RIGHT HAND    Special Requests      BOTTLES DRAWN AEROBIC AND ANAEROBIC Blood Culture results may not be optimal due to an inadequate volume of blood received in culture bottles   Culture      NO GROWTH 2 DAYS Performed at [facility], [address], [place], [place] [address]    Report Status PENDING   CBG monitoring,  ED     Status: Abnormal   Collection Time: 04/28/23 12:51 AM  Result Value Ref Range   Glucose-Capillary 156 (H) 70 - 99 mg/dL    Comment: Glucose reference range applies only to samples taken after fasting for at least 8 hours.  CBC     Status: Abnormal   Collection Time: 04/28/23  1:02 AM  Result Value Ref Range   WBC 8.7 4.0 - 10.5 K/uL   RBC 3.68 (L) 4.22 - 5.81 MIL/uL   Hemoglobin 11.4 (L) 13.0 - 17.0 g/dL   HCT 96.2 (L) 95.2 - 84.1 %   MCV 95.4 80.0 - 100.0 fL   MCH 31.0 26.0 - 34.0 pg   MCHC 32.5 30.0 - 36.0 g/dL   RDW 32.4 40.1 - 02.7 %   Platelets 291 150 - 400 K/uL   nRBC 0.0 0.0 - 0.2 %    Comment: Performed at [facility], [address] [place], [place] [address]  Basic metabolic panel     Status: Abnormal   Collection Time: 04/28/23  1:02 AM  Result Value Ref Range   Sodium 136 135 - 145 mmol/L   Potassium 3.6 3.5 - 5.1 mmol/L   Chloride 96 (L) 98 - 111 mmol/L   CO2 28 22 - 32 mmol/L   Glucose, Bld 147 (H) 70 - 99 mg/dL    Comment: Glucose reference range applies only to samples taken after fasting for at least 8 hours.   BUN 33 (H) 8 - 23 mg/dL   Creatinine, Ser 4.40 (H) 0.61 - 1.24 mg/dL   Calcium 8.6 (L) 8.9 - 10.3 mg/dL   GFR, Estimated 30 (L) >60 mL/min    Comment: (NOTE) Calculated using the CKD-EPI Creatinine Equation (2021)    Anion gap 12 5 - 15    Comment: Performed at [facility], [address] [place], [place] [address]  Hemoglobin A1c     Status: Abnormal   Collection Time: 04/28/23  1:02 AM  Result Value Ref Range   Hgb A1c MFr Bld 6.5 (H) 4.8 - 5.6 %    Comment: (NOTE) Pre diabetes:          5.7%-6.4%  Diabetes:              >6.4%  Glycemic control for   <7.0% adults with diabetes    Mean Plasma Glucose 139.85 mg/dL    Comment: Performed at [facility], [address], [place], [place] [address]  Magnesium     Status: None   Collection Time: 04/28/23  1:02 AM  Result Value Ref Range   Magnesium 2.2 1.7 - 2.4  mg/dL    Comment: Performed at [facility], [address] [place], [place] [address]  Brain natriuretic peptide     Status: None   Collection Time: 04/28/23  1:02 AM  Result Value Ref Range   B Natriuretic Peptide 30.3 0.0 - 100.0 pg/mL    Comment: Performed at [facility], [address], [place], [place] [address]  CBG monitoring, ED     Status: Abnormal   Collection Time: 04/28/23  4:32 AM  Result Value Ref Range   Glucose-Capillary 140 (H) 70 - 99 mg/dL    Comment: Glucose reference range applies only to samples taken after fasting for at least 8 hours.  CBG monitoring, ED     Status: Abnormal   Collection Time: 04/28/23  7:56 AM  Result Value Ref Range   Glucose-Capillary 123 (H) 70 - 99 mg/dL    Comment: Glucose reference range applies only to samples taken after fasting for at least 8 hours.  Aerobic/Anaerobic Culture w Gram Stain (surgical/deep wound)     Status: None (Preliminary result)   Collection Time: 04/28/23 12:02 PM   Specimen: Abdomen; Abscess  Result Value Ref Range   Specimen Description ABDOMEN    Special Requests ABSCESS    Gram Stain      FEW WBC PRESENT, PREDOMINANTLY PMN ABUNDANT GRAM NEGATIVE RODS MODERATE GRAM POSITIVE COCCI IN PAIRS IN CLUSTERS FEW YEAST FEW GRAM POSITIVE RODS    Culture      ABUNDANT STREPTOCOCCI, ALPHA HEMOLYTIC ABUNDANT GRAM NEGATIVE RODS CULTURE REINCUBATED FOR BETTER GROWTH Performed at [facility], [address], [place], [place] [address]    Report Status PENDING   Glucose, capillary     Status: Abnormal   Collection Time: 04/28/23 12:18 PM  Result Value Ref Range   Glucose-Capillary 139 (H) 70 - 99 mg/dL    Comment: Glucose reference range applies only to samples taken after fasting for at least 8 hours.  Glucose, capillary     Status: Abnormal   Collection Time: 04/28/23 10:31 PM  Result Value Ref Range   Glucose-Capillary 132 (H) 70 - 99 mg/dL    Comment: Glucose reference range applies only  to samples taken after fasting for at least 8 hours.  CBC     Status: Abnormal   Collection Time: 04/29/23  5:43 AM  Result Value Ref Range   WBC 7.3 4.0 - 10.5 K/uL   RBC 3.41 (L) 4.22 - 5.81 MIL/uL   Hemoglobin 10.6 (L) 13.0 - 17.0 g/dL   HCT 01.0 (L) 27.2 - 53.6 %   MCV 94.7 80.0 - 100.0 fL   MCH 31.1 26.0 - 34.0 pg   MCHC 32.8 30.0 - 36.0 g/dL   RDW 64.4 03.4 - 74.2 %   Platelets 284 150 - 400 K/uL   nRBC 0.0 0.0 - 0.2 %    Comment: Performed at [facility], [address], [place], [place] [address]  Basic metabolic panel     Status: Abnormal   Collection Time: 04/29/23  5:43 AM  Result Value Ref Range   Sodium 134 (L) 135 - 145 mmol/L   Potassium 3.5 3.5 - 5.1 mmol/L   Chloride 97 (L) 98 - 111 mmol/L   CO2 26 22 - 32 mmol/L   Glucose, Bld 199 (H) 70 - 99 mg/dL    Comment: Glucose reference range applies only to samples taken after fasting for at least 8 hours.   BUN 33 (H) 8 - 23 mg/dL   Creatinine, Ser 8.75 (H) 0.61 - 1.24 mg/dL   Calcium 8.3 (L) 8.9 - 10.3 mg/dL   GFR, Estimated 31 (L) >60 mL/min    Comment: (NOTE) Calculated  using the CKD-EPI Creatinine Equation (2021)    Anion gap 11 5 - 15    Comment: Performed at [facility], [address] [place], [place] [address]  Glucose, capillary     Status: Abnormal   Collection Time: 04/29/23 10:49 AM  Result Value Ref Range   Glucose-Capillary 122 (H) 70 - 99 mg/dL    Comment: Glucose reference range applies only to samples taken after fasting for at least 8 hours.  Glucose, capillary     Status: Abnormal   Collection Time: 04/29/23 12:15 PM  Result Value Ref Range   Glucose-Capillary 159 (H) 70 - 99 mg/dL    Comment: Glucose reference range applies only to samples taken after fasting for at least 8 hours.  Glucose, capillary     Status: Abnormal   Collection Time: 04/29/23  4:00 PM  Result Value Ref Range   Glucose-Capillary 156 (H) 70 - 99 mg/dL    Comment: Glucose reference range applies only to  samples taken after fasting for at least 8 hours.   CT GUIDED PERITONEAL/RETROPERITONEAL FLUID DRAIN BY PERC CATH Result Date: 04/28/2023 INDICATION: 63 year old male with a history of diverticular abscess, prior drainage. He presents emergency department with drainage through a skin tract. CT demonstrates fluid collection adjacent to the colon concerning for recurrent abscess. In addition he was small fluid collection in the anterior abdominal wall. EXAM: CT-GUIDED DRAINAGE OF INTRA-ABDOMINAL ABSCESS TECHNIQUE: Multidetector CT imaging of the abdomen was performed following the standard protocol without IV contrast. RADIATION DOSE REDUCTION: This exam was performed according to the departmental dose-optimization program which includes automated exposure control, adjustment of the mA and/or kV according to patient size and/or use of iterative reconstruction technique. MEDICATIONS: The patient is currently admitted to the hospital and receiving intravenous antibiotics. The antibiotics were administered within an appropriate time frame prior to the initiation of the procedure. ANESTHESIA/SEDATION: Moderate (conscious) sedation was employed during this procedure. A total of Versed 1.5 mg and Fentanyl 75 mcg was administered intravenously by the radiology nurse. Total intra-service moderate Sedation Time: 24 minutes. The patient's level of consciousness and vital signs were monitored continuously by radiology nursing throughout the procedure under my direct supervision. COMPLICATIONS: None PROCEDURE: Informed written consent was obtained from the patient after a thorough discussion of the procedural risks, benefits and alternatives. All questions were addressed. Maximal Sterile Barrier Technique was utilized including caps, mask, sterile gowns, sterile gloves, sterile drape, hand hygiene and skin antiseptic. A timeout was performed prior to the initiation of the procedure. Patient was positioned supine on the CT  gantry table. Scout CT acquired for planning purposes. The patient is prepped and draped in the usual sterile fashion. One sent lidocaine was used for local anesthesia. Using CT guidance, a trocar needle was advanced from an anterior approach through the fluid collection of the anterior abdominal wall in into the pericolonic abscess. At the same time, an oblique approach with a Yueh needle was used to approach the anterior abdominal wall fluid collection. During the course of the study, the majority of the fluid in the anterior abdominal wall abscess decompressed itself through the lesion in the skin. By the completion, there was no significant fluid 4 attempted drainage placement or aspiration in the anterior abdominal wall. Once the trocar needle was in position within the abdominal abscess, modified Seldinger technique was used to place a 10 [place] drain. A proximally 30 cc of frankly purulent material aspirated. Final CT was acquired demonstrating decreased fluid in  the anterior abdominal wall, decreased fluid in the abscess in the abdomen. The drain was sutured in position attached to bulb suction. Patient tolerated the procedure well and remained hemodynamically stable throughout. No complications were encountered and no significant blood loss. IMPRESSION: Status post CT-guided drainage of abdominal abscess adjacent to the left colon. During the course of the exam the majority of fluid in the anterior abdominal wall collection decompressed through the skin. No drain was attempted at this location. Signed, Yvone Neu. Miachel Roux, RPVI [facility] Radiology Electronically Signed   By: Gilmer Mor D.O.   On: 04/28/2023 15:57   CT ABDOMEN PELVIS W CONTRAST Result Date: 04/27/2023 CLINICAL DATA:  Diverticulitis, complication suspected known fisula and abscess Patient reports increased drainage from left lower quadrant operative site. Drain removed 3 months  ago. EXAM: CT ABDOMEN AND PELVIS WITH CONTRAST TECHNIQUE: Multidetector CT imaging of the abdomen and pelvis was performed using the standard protocol following bolus administration of intravenous contrast. RADIATION DOSE REDUCTION: This exam was performed according to the departmental dose-optimization program which includes automated exposure control, adjustment of the mA and/or kV according to patient size and/or use of iterative reconstruction technique. CONTRAST:  75mL OMNIPAQUE IOHEXOL 350 MG/ML SOLN COMPARISON:  CT 10/26/2022 FINDINGS: Lower chest: No basilar airspace disease or pleural effusion. Hepatobiliary: Scattered hepatic hypodensities are too small to characterize but unchanged from prior exam, likely cysts. Moderate gallbladder distension. Small calcified gallstones. No pericholecystic inflammation. No biliary dilatation. Pancreas: No ductal dilatation or inflammation. Spleen: Normal in size without focal abnormality.  Small splenule. Adrenals/Urinary Tract: Normal adrenal glands. No hydronephrosis or perinephric edema. Homogeneous renal enhancement. Lobulated renal contours. Unchanged right renal cyst. No further follow-up imaging is recommended. Urinary bladder is physiologically distended without wall thickening. Stomach/Bowel: Unremarkable appearance of the stomach. No small bowel obstruction or inflammation. The appendix is normal. Colonic diverticulosis without acute diverticulitis. Vascular/Lymphatic: Aortic atherosclerosis. No aneurysm. The portal vein is patent. Prominent mesenteric nodes measuring up to 10 mm. This is nonspecific. Reproductive: Prostate is unremarkable. Other: At site of prior drainage catheter there is a left lower quadrant tract extending from the skin surface to the abdominal wall. In the deep subcutaneous tissues is a area of soft tissue density with internal air measuring 4.3 x 3.6 cm. Only minimal internal fluid. Surrounding fat stranding and inflammation. This does  not definitively connect to the intra-abdominal contents or subjacent bowel. No free air or ascites. Musculoskeletal: L4-L5 fusion hardware. There are no acute or suspicious osseous abnormalities. IMPRESSION: 1. Heterogeneous collection in the left abdominal wall at site of prior drainage catheter. Tract extends from the skin surface to the abdominal wall. In the deep subcutaneous tissues is a area of soft tissue density with internal air measuring 4.3 x 3.6 cm. Only minimal internal fluid. Surrounding fat stranding and inflammation. This does not definitively connect to the intra-abdominal contents or subjacent bowel. 2. Colonic diverticulosis without acute diverticulitis. 3. Cholelithiasis. Electronically Signed   By: Narda Rutherford M.D.   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 This has been going on for 7 months now. He has lost over 100 pounds in the last year. He still has significant comorbidities of a fib, DM II, CHF, h/o stroke, OSA, h/o PE 2019 -I think the only way this fistula resolves is with surgery. The area in question is far away from the sigmoid colon originally, however, he has always drained stool  from his wound so likely the track is present. I think a surgery like this would be difficult as the intestine in the area will be chronically inflamed and possibly densely scarred in. Due to the active fistula/abscess I think a staged procedure or life long colostomy would be the options. He is rather negative on the possibility of a colostomy as he has seen people struggle with it. He does not appear actively septic. He seems to have lost considerable weight in the last year likely because fistulas are very energy intensive diseases. Albumin currently 3.1 -perioperative risk assessment with cardiology consult -WOC education consult -will continue discussion of current plan and long term plan  FEN - reg diet VTE - 80 mg lovenox  daily ID - zosyn 1/31--> Admit - inpatient  I reviewed last 24 h vitals and pain scores, last 48 h intake and output, last 24 h labs and trends, and last 24 h imaging results.  De Blanch [facility] Surgery 04/29/2023, 5:17 PM Please see Amion for pager number during day hours 7:00am-4:30pm or 7:00am -11:30am on weekends

## 2023-04-29 NOTE — Progress Notes (Signed)
Referring Physician(s): Dr. 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 Patient verbalizing frustration that the new drain placement has not corrected the issue.   Allergies: Aldactone [spironolactone], Bystolic [nebivolol hcl], Motrin [ibuprofen], Actos [pioglitazone], Trulicity [dulaglutide], Zestril [lisinopril], Cozaar [losartan], Diflucan [fluconazole], Hctz [hydrochlorothiazide], and Norvasc [amlodipine]  Medications: Prior to Admission medications   Medication Sig Start Date End Date Taking? Authorizing Provider  allopurinol (ZYLOPRIM) 100 MG tablet TAKE ONE TABLET BY MOUTH ONCE DAILY 04/04/23  Yes Stacks, Broadus John, MD  amiodarone (PACERONE) 200 MG tablet Take 1 tablet (200 mg total) by mouth daily. 01/12/23  Yes Antoine Poche, MD  atorvastatin (LIPITOR) 20 MG tablet TAKE ONE TABLET BY MOUTH ONCE DAILY 08/01/22  Yes Branch, Dorothe Pea, MD  chlorthalidone (HYGROTON) 25 MG tablet TAKE 1 TABLET BY MOUTH EVERY OTHER DAY 01/20/23  Yes Sharlene Dory, NP  Cholecalciferol (VITAMIN D-3 PO) Take 1 tablet by mouth daily.   Yes [provider]  dapagliflozin propanediol (FARXIGA) 10 MG TABS tablet TAKE ONE TABLET BY MOUTH DAILY BEFORE BREAKFAST 04/05/23  Yes Stacks, Broadus John, MD  docusate sodium (COLACE) 100 MG capsule Take 1 capsule (100 mg total) by mouth 2 (two) times daily. Patient taking differently: Take 100 mg by mouth 2 (two) times daily as needed (constipation.). 06/28/22  Yes Osvaldo Shipper, MD  hydrALAZINE (APRESOLINE) 25 MG tablet Take 1 tablet (25 mg total) by mouth daily. 09/08/22  Yes Sharlene Dory, NP  JANUVIA 100 MG tablet TAKE ONE TABLET BY MOUTH ONCE DAILY 04/05/23  Yes Mechele Claude, MD  metFORMIN (GLUCOPHAGE-XR) 750 MG 24 hr tablet Take 2 tablets  (1,500 mg total) by mouth daily with breakfast. Patient taking differently: Take 750 mg by mouth daily with breakfast. 05/11/22  Yes Mechele Claude, MD  oxyCODONE-acetaminophen (PERCOCET) 10-325 MG tablet Take 1 tablet by mouth 3 (three) times daily as needed for pain. Patient taking differently: Take 1 tablet by mouth every 6 (six) hours as needed for pain. 06/28/22  Yes Osvaldo Shipper, MD  polyethylene glycol (MIRALAX / GLYCOLAX) 17 g packet Take 17 g by mouth daily. Patient taking differently: Take 17 g by mouth daily as needed (constipation.). 06/28/22  Yes Osvaldo Shipper, MD  Prasterone, DHEA, (DHEA PO) Take 1 capsule by mouth daily.   Yes [provider]  rivaroxaban (XARELTO) 20 MG TABS tablet TAKE ONE TABLET BY MOUTH DAILY WITH SUPPER 03/20/23  Yes Stacks, Broadus John, MD  senna-docusate (SENOKOT-S) 8.6-50 MG tablet Take 2 tablets by mouth at bedtime. Patient taking differently: Take 2 tablets by mouth at bedtime as needed (constipation). 06/28/22  Yes Osvaldo Shipper, MD  valsartan (DIOVAN) 160 MG tablet TAKE 1 TABLET BY MOUTH EVERY DAY 03/15/23  Yes Mechele Claude, MD  Accu-Chek Softclix Lancets lancets Use as instructed to monitor glucose twice daily 09/23/21   Dani Gobble, NP  glucose blood (ACCU-CHEK GUIDE) test strip Use as instructed to monitor glucose twice daily 10/28/21   Dani Gobble, NP     Vital Signs: BP 114/75 (BP Location: Right Arm)   Pulse 78   Temp 98.1 F (36.7 C)   Resp 18   Ht 6' (1.829 m)   Wt (!) 350 lb (158.8 kg)   SpO2 99%   BMI 47.47 kg/m   Physical Exam Vitals and  nursing note reviewed.  Constitutional:      Appearance: He is well-developed. He is obese.  HENT:     Head: Normocephalic.  Pulmonary:     Effort: Pulmonary effort is normal.  Abdominal:     Comments: Positive LLQ  drain to  suction - Site is unremarkable with no erythema, edema, tenderness, bleeding or drainage noted at exit site. Suture and stat lock in place. Dressing is  clean dry and intact. Scant tan colored fluid noted in line of the bulb suction device. No fluid in the JP drain itself. Drain is able to be flushed easily. No leakage or pain with flushing. Feculent malodorous output noted to be at the exit site of the prior abscess drain.     Musculoskeletal:        General: Normal range of motion.     Cervical back: Normal range of motion.  Skin:    General: Skin is dry.  Neurological:     Mental Status: He is alert and oriented to person, place, and time.     Imaging: CT GUIDED PERITONEAL/RETROPERITONEAL FLUID DRAIN BY PERC CATH Result Date: 04/28/2023 INDICATION: 63 year old male with a history of diverticular abscess, prior drainage. He presents emergency department with drainage through a skin tract. CT demonstrates fluid collection adjacent to the colon concerning for recurrent abscess. In addition he was small fluid collection in the anterior abdominal wall. EXAM: CT-GUIDED DRAINAGE OF INTRA-ABDOMINAL ABSCESS TECHNIQUE: Multidetector CT imaging of the abdomen was performed following the standard protocol without IV contrast. RADIATION DOSE REDUCTION: This exam was performed according to the departmental dose-optimization program which includes automated exposure control, adjustment of the mA and/or kV according to patient size and/or use of iterative reconstruction technique. MEDICATIONS: The patient is currently admitted to the hospital and receiving intravenous antibiotics. The antibiotics were administered within an appropriate time frame prior to the initiation of the procedure. ANESTHESIA/SEDATION: Moderate (conscious) sedation was employed during this procedure. A total of Versed 1.5 mg and Fentanyl 75 mcg was administered intravenously by the radiology nurse. Total intra-service moderate Sedation Time: 24 minutes. The patient's level of consciousness and vital signs were monitored continuously by radiology nursing throughout the procedure under my  direct supervision. COMPLICATIONS: None PROCEDURE: Informed written consent was obtained from the patient after a thorough discussion of the procedural risks, benefits and alternatives. All questions were addressed. Maximal Sterile Barrier Technique was utilized including caps, mask, sterile gowns, sterile gloves, sterile drape, hand hygiene and skin antiseptic. A timeout was performed prior to the initiation of the procedure. Patient was positioned supine on the CT gantry table. Scout CT acquired for planning purposes. The patient is prepped and draped in the usual sterile fashion. One sent lidocaine was used for local anesthesia. Using CT guidance, a trocar needle was advanced from an anterior approach through the fluid collection of the anterior abdominal wall in into the pericolonic abscess. At the same time, an oblique approach with a Yueh needle was used to approach the anterior abdominal wall fluid collection. During the course of the study, the majority of the fluid in the anterior abdominal wall abscess decompressed itself through the lesion in the skin. By the completion, there was no significant fluid 4 attempted drainage placement or aspiration in the anterior abdominal wall. Once the trocar needle was in position within the abdominal abscess, modified Seldinger technique was used to place a 10 [place] drain. A proximally 30 cc of frankly purulent material aspirated. Final CT was acquired demonstrating decreased  fluid in the anterior abdominal wall, decreased fluid in the abscess in the abdomen. The drain was sutured in position attached to bulb suction. Patient tolerated the procedure well and remained hemodynamically stable throughout. No complications were encountered and no significant blood loss. IMPRESSION: Status post CT-guided drainage of abdominal abscess adjacent to the left colon. During the course of the exam the majority of fluid in the anterior abdominal wall collection decompressed through  the skin. No drain was attempted at this location. Signed, Yvone Neu. Miachel Roux, RPVI [facility] Electronically Signed   By: Gilmer Mor D.O.   On: 04/28/2023 15:57   CT ABDOMEN PELVIS W CONTRAST Result Date: 04/27/2023 CLINICAL DATA:  Diverticulitis, complication suspected known fisula and abscess Patient reports increased drainage from left lower quadrant operative site. Drain removed 3 months ago. EXAM: CT ABDOMEN AND PELVIS WITH CONTRAST TECHNIQUE: Multidetector CT imaging of the abdomen and pelvis was performed using the standard protocol following bolus administration of intravenous contrast. RADIATION DOSE REDUCTION: This exam was performed according to the departmental dose-optimization program which includes automated exposure control, adjustment of the mA and/or kV according to patient size and/or use of iterative reconstruction technique. CONTRAST:  75mL OMNIPAQUE IOHEXOL 350 MG/ML SOLN COMPARISON:  CT 10/26/2022 FINDINGS: Lower chest: No basilar airspace disease or pleural effusion. Hepatobiliary: Scattered hepatic hypodensities are too small to characterize but unchanged from prior exam, likely cysts. Moderate gallbladder distension. Small calcified gallstones. No pericholecystic inflammation. No biliary dilatation. Pancreas: No ductal dilatation or inflammation. Spleen: Normal in size without focal abnormality.  Small splenule. Adrenals/Urinary Tract: Normal adrenal glands. No hydronephrosis or perinephric edema. Homogeneous renal enhancement. Lobulated renal contours. Unchanged right renal cyst. No further follow-up imaging is recommended. Urinary bladder is physiologically distended without wall thickening. Stomach/Bowel: Unremarkable appearance of the stomach. No small bowel obstruction or inflammation. The appendix is normal. Colonic diverticulosis without acute diverticulitis. Vascular/Lymphatic: Aortic atherosclerosis. No  aneurysm. The portal vein is patent. Prominent mesenteric nodes measuring up to 10 mm. This is nonspecific. Reproductive: Prostate is unremarkable. Other: At site of prior drainage catheter there is a left lower quadrant tract extending from the skin surface to the abdominal wall. In the deep subcutaneous tissues is a area of soft tissue density with internal air measuring 4.3 x 3.6 cm. Only minimal internal fluid. Surrounding fat stranding and inflammation. This does not definitively connect to the intra-abdominal contents or subjacent bowel. No free air or ascites. Musculoskeletal: L4-L5 fusion hardware. There are no acute or suspicious osseous abnormalities. IMPRESSION: 1. Heterogeneous collection in the left abdominal wall at site of prior drainage catheter. Tract extends from the skin surface to the abdominal wall. In the deep subcutaneous tissues is a area of soft tissue density with internal air measuring 4.3 x 3.6 cm. Only minimal internal fluid. Surrounding fat stranding and inflammation. This does not definitively connect to the intra-abdominal contents or subjacent bowel. 2. Colonic diverticulosis without acute diverticulitis. 3. Cholelithiasis. Electronically Signed   By: Narda Rutherford M.D.   On: 04/27/2023 20:48    Labs:  CBC: Recent Labs    02/21/23 1544 04/27/23 1810 04/28/23 0102 04/29/23 0543  WBC 6.6 7.9 8.7 7.3  HGB 12.9* 13.0 11.4* 10.6*  HCT 40.6 39.7 35.1* 32.3*  PLT 315 373 291 284    COAGS: No results for input(s): "INR", "APTT" in the last 8760 hours.  BMP: Recent Labs    10/06/22 0419 02/21/23 1544 04/27/23 1810 04/28/23 0102 04/29/23 0543  NA 133* 142 137 136 134*  K 4.2 4.2 3.6 3.6 3.5  CL 96* 102 96* 96* 97*  CO2 29 28 26 28 26   GLUCOSE 103* 233* 121* 147* 199*  BUN 17 18 33* 33* 33*  CALCIUM 8.7* 9.3 9.4 8.6* 8.3*  CREATININE 1.45* 1.47* 2.24* 2.40* 2.35*  GFRNONAA 55*  --  33* 30* 31*    LIVER FUNCTION TESTS: Recent Labs    08/02/22 1608  10/01/22 0829 02/21/23 1544 04/27/23 1810  BILITOT 0.8 1.0 0.3 0.7  AST 19 13* 11 22  ALT 18 10 11 30   ALKPHOS 79 56 93 95  PROT 7.2 7.1 7.1 7.9  ALBUMIN 3.9 2.9* 3.8* 3.1*    Assessment and Plan:  63 y.o. male inpatient. Known to IR. History of CVA, MI, HTN, DVT, CAD, diverticulitis. IR placed a LLQ abscess into a diverticular abscess with fistula on 7.6.24 removed on 11.22.24. Patient presented to the ED at [facility] on 1.30.25 with drainage from the prior IR drain site. CT Abd pelvis from 1.30.25 reads Heterogeneous collection in the left abdominal wall at site of prior drainage catheter. Tract extends from the skin surface to the abdominal wall.  On 1.31.25 IR placed a LLQ to abscess drain to the recurrent fluid collection with 30 ml of purulent output aspirated. Copius amounts of output noted to be coming from the prior abscess drain track.   Drain Location: LLQ Size: Fr size: 10 Fr Date of placement: 1.31.25  Currently to: Drain collection device: suction bulb 24 hour output:  Output by Drain (mL) 04/27/23 0700 - 04/27/23 1459 04/27/23 1500 - 04/27/23 2259 04/27/23 2300 - 04/28/23 0659 04/28/23 0700 - 04/28/23 1459 04/28/23 1500 - 04/28/23 2259 04/28/23 2300 - 04/29/23 0659 04/29/23 0700 - 04/29/23 1123  Closed System Drain Lateral LUQ 10 Fr.    40  55   Cultures grew strep alpha hemolytic and gram negative rods. No leukocytosis  Interval imaging/drain manipulation:  None since drain placement  Current examination: Flushes/aspirates easily.  Insertion site unremarkable. Suture and stat lock in place. Dressed appropriately.   Plan: Continue TID flushes with 5 cc NS. Record output Q shift. Dressing changes QD or PRN if soiled.  Call IR APP or on call IR MD if difficulty flushing or sudden change in drain output.  Repeat imaging/possible drain injection once output < 10 mL/QD (excluding flush material). Consideration for drain removal if output is < 10 mL/QD (excluding flush  material), pending discussion with the providing surgical service.  Case discussed with IR Attending Dr. Richarda Overlie who recommends that general surgery be consulted. This was communicated to the Team via EPIC Chat  IR will continue to follow - please call with questions or concerns.   Electronically Signed: Alene Mires, NP 04/29/2023, 11:23 AM   I spent a total of 15 Minutes at the patient's bedside AND on the patient's hospital floor or unit, greater than 50% of which was counseling/coordinating care for LLQ diverticular abscess with fistula

## 2023-04-29 NOTE — Plan of Care (Signed)

## 2023-04-29 NOTE — Progress Notes (Addendum)
PROGRESS NOTE    Bradley Torres  ZOX:[id number] DOB: [birth year] DOA: 04/27/2023 PCP: Mechele Claude, MD   Brief Narrative: Bradley Torres is a 63 y.o. male with a history of CAD, paroxysmal atrial fibrillation, diabetes mellitus type 2, gout, hyperlipidemia, hypertension, obesity class III, OSA, OHS, pulm hypertension, chronic heart failure, chronic back pain, CKD stage IIIa.  Patient presented secondary to an abdominal wall wound with feculent/purulent drainage and was found to have evidence of an abdominal wall abscess.  Empiric antibiotics started, blood cultures obtained, interventional radiology consulted and placed a percutaneous drain for a recurrent LLQ abscess. ID consulted.   Assessment/Plan:  Abdominal wall abscess Intraabdominal abscess CT abdomen/pelvis this admission was significant for an abscess measuring 4.3 x 3.6 cm without clear evidence of a connection to the intra-abdominal contents/subadjacent bowel.  Empiric Zosyn started.  Interventional radiology was consulted and placed drain; superficial abscess was noted to have decompressed itself during IR's procedure. -Continue Zosyn IV -Follow-up IR recommendations -Follow-up wound culture data -Follow-up blood cultures -ID consult -Cardiology consult per general surgery recommendations for perioperative risk assessment  AKI on CKD stage IIIa Patient's baseline creatinine is about 1.4.  Patient with a BUN/creatinine of 33/2.2 respectively on admission.  CT on admission without evidence of hydronephrosis.  Creatinine slightly worsened to 2.35 this morning. -Continue IV fluids -Repeat BMP in a.m.  Paroxysmal atrial fibrillation Currently in sinus rhythm.  Patient is on Xarelto, amiodarone as an outpatient.  Xarelto held secondary to anticipated procedure.  Amiodarone held on admission secondary to prolonged QTc.  QTc slightly decreased from admission this morning.  QTc prolongation Initial QTc measured to be 550 ms,  currently down to 538 ms this morning. -Continue telemetry  Chronic heart failure with mildly reduced ejection fraction Stable.  Last LVEF of 50 to 55% with associated moderately reduced RV systolic function.  Patient appears euvolemic at this time.  History of CVA Noted.  Patient on Xarelto as an outpatient which was held secondary to anticipated procedure.  Patient is also on Lipitor. -Continue Lipitor  Gout -Continue allopurinol  Hyperlipidemia -Continue Lipitor  CAD Noted.  Primary hypertension Patient is on hydralazine, chlorthalidone, valsartan as an outpatient.  Antihypertensives were held on admission.  Patient is currently normotensive with hypotension initially on presentation. -Continue to hold antihypertensives  Diabetes mellitus, type II Controlled with hemoglobin A1c of 6.5%.  Patient is managed on Januvia, metformin, Marcelline Deist as an outpatient.  SSI started on admission. -Continue SSI  History of DVT/PE Remote history from 2019.  Patient is on Xarelto as an outpatient which is held on admission secondary to anticipated procedure. -Resume Xarelto once cleared by interventional radiology  Obesity, class III Estimated body mass index is 47.47 kg/m as calculated from the following:   Height as of this encounter: 6' (1.829 m).   Weight as of this encounter: 158.8 kg.  OSA/OHS -Continue CPAP nightly   DVT prophylaxis: SCDs Code Status:   Code Status: Full Code Family Communication: None at bedside Disposition Plan: Discharge home likely in 2 to 3 days pending IR recommendation/management, ID recommendations and transition to outpatient antibiotics   Consultants:  Interventional radiology  Procedures:  CT drainage of LLQ recurrent abscess. New 40F drain.  Antimicrobials: Zosyn IV   Subjective: Questions about why he has a drain not addressing superficial abscess. No other concerns  Objective: BP 114/75 (BP Location: Right Arm)   Pulse 78   Temp  98.1 F (36.7 C)   Resp 18  Ht 6' (1.829 m)   Wt (!) 158.8 kg   SpO2 99%   BMI 47.47 kg/m   Examination:  General exam: Appears calm and comfortable  Respiratory system: Clear to auscultation. Respiratory effort normal. Cardiovascular system: S1 & S2 heard, RRR. Gastrointestinal system: Abdomen is nondistended, soft and nontender. Normal bowel sounds heard. Central nervous system: Alert and oriented. No focal neurological deficits. Skin: Continued foul smelling drainage noted from old incision site Psychiatry: Judgement and insight appear normal. Mood & affect appropriate.    Data Reviewed: I have personally reviewed following labs and imaging studies   Last CBC Lab Results  Component Value Date   WBC 7.3 04/29/2023   HGB 10.6 (L) 04/29/2023   HCT 32.3 (L) 04/29/2023   MCV 94.7 04/29/2023   MCH 31.1 04/29/2023   RDW 14.6 04/29/2023   PLT 284 04/29/2023     Last metabolic panel Lab Results  Component Value Date   GLUCOSE 199 (H) 04/29/2023   NA 134 (L) 04/29/2023   K 3.5 04/29/2023   CL 97 (L) 04/29/2023   CO2 26 04/29/2023   BUN 33 (H) 04/29/2023   CREATININE 2.35 (H) 04/29/2023   GFRNONAA 31 (L) 04/29/2023   CALCIUM 8.3 (L) 04/29/2023   PHOS 3.4 06/19/2022   PROT 7.9 04/27/2023   ALBUMIN 3.1 (L) 04/27/2023   LABGLOB 3.3 02/21/2023   AGRATIO 1.2 08/02/2022   BILITOT 0.7 04/27/2023   ALKPHOS 95 04/27/2023   AST 22 04/27/2023   ALT 30 04/27/2023   ANIONGAP 11 04/29/2023     Creatinine Clearance: Estimated Creatinine Clearance: 51.4 mL/min (A) (by C-G formula based on SCr of 2.35 mg/dL (H)).  Recent Results (from the past 240 hours)  Blood culture (routine x 2)     Status: None (Preliminary result)   Collection Time: 04/27/23  5:10 PM   Specimen: BLOOD  Result Value Ref Range Status   Specimen Description BLOOD SITE NOT SPECIFIED  Final   Special Requests   Final    BOTTLES DRAWN AEROBIC AND ANAEROBIC Blood Culture results may not be optimal due to  an inadequate volume of blood received in culture bottles   Culture   Final    NO GROWTH 2 DAYS Performed at [facility], [address] [place], [place] [address]    Report Status PENDING  Incomplete  Blood culture (routine x 2)     Status: None (Preliminary result)   Collection Time: 04/27/23 10:30 PM   Specimen: BLOOD RIGHT HAND  Result Value Ref Range Status   Specimen Description BLOOD RIGHT HAND  Final   Special Requests   Final    BOTTLES DRAWN AEROBIC AND ANAEROBIC Blood Culture results may not be optimal due to an inadequate volume of blood received in culture bottles   Culture   Final    NO GROWTH 2 DAYS Performed at [facility], [address], [place], [place] [address]    Report Status PENDING  Incomplete  Aerobic/Anaerobic Culture w Gram Stain (surgical/deep wound)     Status: None (Preliminary result)   Collection Time: 04/28/23 12:02 PM   Specimen: Abdomen; Abscess  Result Value Ref Range Status   Specimen Description ABDOMEN  Final   Special Requests ABSCESS  Final   Gram Stain   Final    FEW WBC PRESENT, PREDOMINANTLY PMN ABUNDANT GRAM NEGATIVE RODS MODERATE GRAM POSITIVE COCCI IN PAIRS IN CLUSTERS FEW YEAST FEW GRAM POSITIVE RODS    Culture  Final    ABUNDANT STREPTOCOCCI, ALPHA HEMOLYTIC ABUNDANT GRAM NEGATIVE RODS CULTURE REINCUBATED FOR BETTER GROWTH Performed at [facility], [address], [place], [place] [address]    Report Status PENDING  Incomplete      Radiology Studies: CT GUIDED PERITONEAL/RETROPERITONEAL FLUID DRAIN BY PERC CATH Result Date: 04/28/2023 INDICATION: 63 year old male with a history of diverticular abscess, prior drainage. He presents emergency department with drainage through a skin tract. CT demonstrates fluid collection adjacent to the colon concerning for recurrent abscess. In addition he was small fluid collection in the anterior abdominal wall. EXAM: CT-GUIDED DRAINAGE OF INTRA-ABDOMINAL ABSCESS  TECHNIQUE: Multidetector CT imaging of the abdomen was performed following the standard protocol without IV contrast. RADIATION DOSE REDUCTION: This exam was performed according to the departmental dose-optimization program which includes automated exposure control, adjustment of the mA and/or kV according to patient size and/or use of iterative reconstruction technique. MEDICATIONS: The patient is currently admitted to the hospital and receiving intravenous antibiotics. The antibiotics were administered within an appropriate time frame prior to the initiation of the procedure. ANESTHESIA/SEDATION: Moderate (conscious) sedation was employed during this procedure. A total of Versed 1.5 mg and Fentanyl 75 mcg was administered intravenously by the radiology nurse. Total intra-service moderate Sedation Time: 24 minutes. The patient's level of consciousness and vital signs were monitored continuously by radiology nursing throughout the procedure under my direct supervision. COMPLICATIONS: None PROCEDURE: Informed written consent was obtained from the patient after a thorough discussion of the procedural risks, benefits and alternatives. All questions were addressed. Maximal Sterile Barrier Technique was utilized including caps, mask, sterile gowns, sterile gloves, sterile drape, hand hygiene and skin antiseptic. A timeout was performed prior to the initiation of the procedure. Patient was positioned supine on the CT gantry table. Scout CT acquired for planning purposes. The patient is prepped and draped in the usual sterile fashion. One sent lidocaine was used for local anesthesia. Using CT guidance, a trocar needle was advanced from an anterior approach through the fluid collection of the anterior abdominal wall in into the pericolonic abscess. At the same time, an oblique approach with a Yueh needle was used to approach the anterior abdominal wall fluid collection. During the course of the study, the majority of the  fluid in the anterior abdominal wall abscess decompressed itself through the lesion in the skin. By the completion, there was no significant fluid 4 attempted drainage placement or aspiration in the anterior abdominal wall. Once the trocar needle was in position within the abdominal abscess, modified Seldinger technique was used to place a 10 [place] drain. A proximally 30 cc of frankly purulent material aspirated. Final CT was acquired demonstrating decreased fluid in the anterior abdominal wall, decreased fluid in the abscess in the abdomen. The drain was sutured in position attached to bulb suction. Patient tolerated the procedure well and remained hemodynamically stable throughout. No complications were encountered and no significant blood loss. IMPRESSION: Status post CT-guided drainage of abdominal abscess adjacent to the left colon. During the course of the exam the majority of fluid in the anterior abdominal wall collection decompressed through the skin. No drain was attempted at this location. Signed, Yvone Neu. Miachel Roux, RPVI [facility] Electronically Signed   By: Gilmer Mor D.O.   On: 04/28/2023 15:57   CT ABDOMEN PELVIS W CONTRAST Result Date: 04/27/2023 CLINICAL DATA:  Diverticulitis, complication suspected known fisula and abscess Patient reports increased drainage from left lower quadrant  operative site. Drain removed 3 months ago. EXAM: CT ABDOMEN AND PELVIS WITH CONTRAST TECHNIQUE: Multidetector CT imaging of the abdomen and pelvis was performed using the standard protocol following bolus administration of intravenous contrast. RADIATION DOSE REDUCTION: This exam was performed according to the departmental dose-optimization program which includes automated exposure control, adjustment of the mA and/or kV according to patient size and/or use of iterative reconstruction technique. CONTRAST:  75mL OMNIPAQUE IOHEXOL 350 MG/ML SOLN  COMPARISON:  CT 10/26/2022 FINDINGS: Lower chest: No basilar airspace disease or pleural effusion. Hepatobiliary: Scattered hepatic hypodensities are too small to characterize but unchanged from prior exam, likely cysts. Moderate gallbladder distension. Small calcified gallstones. No pericholecystic inflammation. No biliary dilatation. Pancreas: No ductal dilatation or inflammation. Spleen: Normal in size without focal abnormality.  Small splenule. Adrenals/Urinary Tract: Normal adrenal glands. No hydronephrosis or perinephric edema. Homogeneous renal enhancement. Lobulated renal contours. Unchanged right renal cyst. No further follow-up imaging is recommended. Urinary bladder is physiologically distended without wall thickening. Stomach/Bowel: Unremarkable appearance of the stomach. No small bowel obstruction or inflammation. The appendix is normal. Colonic diverticulosis without acute diverticulitis. Vascular/Lymphatic: Aortic atherosclerosis. No aneurysm. The portal vein is patent. Prominent mesenteric nodes measuring up to 10 mm. This is nonspecific. Reproductive: Prostate is unremarkable. Other: At site of prior drainage catheter there is a left lower quadrant tract extending from the skin surface to the abdominal wall. In the deep subcutaneous tissues is a area of soft tissue density with internal air measuring 4.3 x 3.6 cm. Only minimal internal fluid. Surrounding fat stranding and inflammation. This does not definitively connect to the intra-abdominal contents or subjacent bowel. No free air or ascites. Musculoskeletal: L4-L5 fusion hardware. There are no acute or suspicious osseous abnormalities. IMPRESSION: 1. Heterogeneous collection in the left abdominal wall at site of prior drainage catheter. Tract extends from the skin surface to the abdominal wall. In the deep subcutaneous tissues is a area of soft tissue density with internal air measuring 4.3 x 3.6 cm. Only minimal internal fluid. Surrounding fat  stranding and inflammation. This does not definitively connect to the intra-abdominal contents or subjacent bowel. 2. Colonic diverticulosis without acute diverticulitis. 3. Cholelithiasis. Electronically Signed   By: Narda Rutherford M.D.   On: 04/27/2023 20:48      LOS: 1 day    Jacquelin Hawking, MD [facility] 04/29/2023, 12:28 PM   If 7PM-7AM, please contact night-coverage [email or web address]

## 2023-04-30 DIAGNOSIS — K651 Peritoneal abscess: Secondary | ICD-10-CM | POA: Diagnosis not present

## 2023-04-30 DIAGNOSIS — K632 Fistula of intestine: Secondary | ICD-10-CM | POA: Diagnosis not present

## 2023-04-30 DIAGNOSIS — I152 Hypertension secondary to endocrine disorders: Secondary | ICD-10-CM | POA: Diagnosis not present

## 2023-04-30 DIAGNOSIS — E1159 Type 2 diabetes mellitus with other circulatory complications: Secondary | ICD-10-CM | POA: Diagnosis not present

## 2023-04-30 DIAGNOSIS — K5732 Diverticulitis of large intestine without perforation or abscess without bleeding: Secondary | ICD-10-CM | POA: Diagnosis not present

## 2023-04-30 DIAGNOSIS — Z01810 Encounter for preprocedural cardiovascular examination: Secondary | ICD-10-CM

## 2023-04-30 DIAGNOSIS — L02211 Cutaneous abscess of abdominal wall: Secondary | ICD-10-CM | POA: Diagnosis not present

## 2023-04-30 DIAGNOSIS — K5720 Diverticulitis of large intestine with perforation and abscess without bleeding: Secondary | ICD-10-CM | POA: Diagnosis not present

## 2023-04-30 DIAGNOSIS — I480 Paroxysmal atrial fibrillation: Secondary | ICD-10-CM | POA: Diagnosis not present

## 2023-04-30 LAB — BASIC METABOLIC PANEL
Anion gap: 10 (ref 5–15)
BUN: 21 mg/dL (ref 8–23)
CO2: 24 mmol/L (ref 22–32)
Calcium: 8.2 mg/dL — ABNORMAL LOW (ref 8.9–10.3)
Chloride: 100 mmol/L (ref 98–111)
Creatinine, Ser: 1.63 mg/dL — ABNORMAL HIGH (ref 0.61–1.24)
GFR, Estimated: 48 mL/min — ABNORMAL LOW (ref >60)
Glucose, Bld: 129 mg/dL — ABNORMAL HIGH (ref 70–99)
Potassium: 3.5 mmol/L (ref 3.5–5.1)
Sodium: 134 mmol/L — ABNORMAL LOW (ref 135–145)

## 2023-04-30 LAB — CBC
HCT: 31.3 % — ABNORMAL LOW (ref 39.0–52.0)
Hemoglobin: 10.5 g/dL — ABNORMAL LOW (ref 13.0–17.0)
MCH: 31 pg (ref 26.0–34.0)
MCHC: 33.5 g/dL (ref 30.0–36.0)
MCV: 92.3 fL (ref 80.0–100.0)
Platelets: 276 10*3/uL (ref 150–400)
RBC: 3.39 MIL/uL — ABNORMAL LOW (ref 4.22–5.81)
RDW: 14.2 % (ref 11.5–15.5)
WBC: 8.8 10*3/uL (ref 4.0–10.5)
nRBC: 0 % (ref 0.0–0.2)

## 2023-04-30 LAB — GLUCOSE, CAPILLARY
Glucose-Capillary: 127 mg/dL — ABNORMAL HIGH (ref 70–99)
Glucose-Capillary: 132 mg/dL — ABNORMAL HIGH (ref 70–99)
Glucose-Capillary: 130 mg/dL — ABNORMAL HIGH (ref 70–99)
Glucose-Capillary: 160 mg/dL — ABNORMAL HIGH (ref 70–99)

## 2023-04-30 LAB — SEDIMENTATION RATE: Sed Rate: 95 mm/h — ABNORMAL HIGH (ref 0–16)

## 2023-04-30 MED ORDER — AMIODARONE HCL 200 MG PO TABS
200.0000 mg | ORAL_TABLET | Freq: Every day | ORAL | Status: DC
  Administered 2023-04-30 – 2023-05-05 (×6): 200 mg via ORAL
  Filled 2023-04-30 (×6): qty 1

## 2023-04-30 MED ORDER — ZINC OXIDE 12.8 % EX OINT
TOPICAL_OINTMENT | CUTANEOUS | Status: DC | PRN
  Administered 2023-04-30: 1 via TOPICAL
  Filled 2023-04-30: qty 56.7

## 2023-04-30 NOTE — Progress Notes (Cosign Needed)
Referring Physician(s): Dr. John Giovanni   Supervising Physician: Richarda Overlie  Patient Status:  [facility] - In-pt  Chief Complaint:  Recurrent diverticular abscess with fistula s/p Abscess drain placement on 1.31.25   Subjective:  Patient seen at bedside. Sitting up. Patient voiced frustration that he continues to have the same problem that he had upon admission on 1.30.25. Patient states that he would like to have surgery but verbalized understands he needs medical clearance.   Allergies: Aldactone [spironolactone], Bystolic [nebivolol hcl], Motrin [ibuprofen], Actos [pioglitazone], Trulicity [dulaglutide], Zestril [lisinopril], Cozaar [losartan], Diflucan [fluconazole], Hctz [hydrochlorothiazide], and Norvasc [amlodipine]  Medications: Prior to Admission medications   Medication Sig Start Date End Date Taking? Authorizing Provider  allopurinol (ZYLOPRIM) 100 MG tablet TAKE ONE TABLET BY MOUTH ONCE DAILY 04/04/23  Yes Stacks, Broadus John, MD  amiodarone (PACERONE) 200 MG tablet Take 1 tablet (200 mg total) by mouth daily. 01/12/23  Yes Antoine Poche, MD  atorvastatin (LIPITOR) 20 MG tablet TAKE ONE TABLET BY MOUTH ONCE DAILY 08/01/22  Yes Branch, Dorothe Pea, MD  chlorthalidone (HYGROTON) 25 MG tablet TAKE 1 TABLET BY MOUTH EVERY OTHER DAY 01/20/23  Yes Sharlene Dory, NP  Cholecalciferol (VITAMIN D-3 PO) Take 1 tablet by mouth daily.   Yes [provider]  dapagliflozin propanediol (FARXIGA) 10 MG TABS tablet TAKE ONE TABLET BY MOUTH DAILY BEFORE BREAKFAST 04/05/23  Yes Stacks, Broadus John, MD  docusate sodium (COLACE) 100 MG capsule Take 1 capsule (100 mg total) by mouth 2 (two) times daily. Patient taking differently: Take 100 mg by mouth 2 (two) times daily as needed (constipation.). 06/28/22  Yes Osvaldo Shipper, MD  hydrALAZINE (APRESOLINE) 25 MG tablet Take 1 tablet (25 mg total) by mouth daily. 09/08/22  Yes Sharlene Dory, NP  JANUVIA 100 MG tablet TAKE ONE TABLET BY MOUTH  ONCE DAILY 04/05/23  Yes Mechele Claude, MD  metFORMIN (GLUCOPHAGE-XR) 750 MG 24 hr tablet Take 2 tablets (1,500 mg total) by mouth daily with breakfast. Patient taking differently: Take 750 mg by mouth daily with breakfast. 05/11/22  Yes Mechele Claude, MD  oxyCODONE-acetaminophen (PERCOCET) 10-325 MG tablet Take 1 tablet by mouth 3 (three) times daily as needed for pain. Patient taking differently: Take 1 tablet by mouth every 6 (six) hours as needed for pain. 06/28/22  Yes Osvaldo Shipper, MD  polyethylene glycol (MIRALAX / GLYCOLAX) 17 g packet Take 17 g by mouth daily. Patient taking differently: Take 17 g by mouth daily as needed (constipation.). 06/28/22  Yes Osvaldo Shipper, MD  Prasterone, DHEA, (DHEA PO) Take 1 capsule by mouth daily.   Yes [provider]  rivaroxaban (XARELTO) 20 MG TABS tablet TAKE ONE TABLET BY MOUTH DAILY WITH SUPPER 03/20/23  Yes Stacks, Broadus John, MD  senna-docusate (SENOKOT-S) 8.6-50 MG tablet Take 2 tablets by mouth at bedtime. Patient taking differently: Take 2 tablets by mouth at bedtime as needed (constipation). 06/28/22  Yes Osvaldo Shipper, MD  valsartan (DIOVAN) 160 MG tablet TAKE 1 TABLET BY MOUTH EVERY DAY 03/15/23  Yes Mechele Claude, MD  Accu-Chek Softclix Lancets lancets Use as instructed to monitor glucose twice daily 09/23/21   Dani Gobble, NP  glucose blood (ACCU-CHEK GUIDE) test strip Use as instructed to monitor glucose twice daily 10/28/21   Dani Gobble, NP     Vital Signs: BP 125/80 (BP Location: Right Arm)   Pulse 68   Temp 98.4 F (36.9 C) (Oral)   Resp 17   Ht 6' (1.829 m)   [place] Fordyce Marland Kitchen)  350 lb (158.8 kg)   SpO2 99%   BMI 47.47 kg/m   Physical Exam  Imaging: CT GUIDED PERITONEAL/RETROPERITONEAL FLUID DRAIN BY PERC CATH Result Date: 04/28/2023 INDICATION: 62 year old male with a history of diverticular abscess, prior drainage. He presents emergency department with drainage through a skin tract. CT demonstrates fluid collection  adjacent to the colon concerning for recurrent abscess. In addition he was small fluid collection in the anterior abdominal wall. EXAM: CT-GUIDED DRAINAGE OF INTRA-ABDOMINAL ABSCESS TECHNIQUE: Multidetector CT imaging of the abdomen was performed following the standard protocol without IV contrast. RADIATION DOSE REDUCTION: This exam was performed according to the departmental dose-optimization program which includes automated exposure control, adjustment of the mA and/or kV according to patient size and/or use of iterative reconstruction technique. MEDICATIONS: The patient is currently admitted to the hospital and receiving intravenous antibiotics. The antibiotics were administered within an appropriate time frame prior to the initiation of the procedure. ANESTHESIA/SEDATION: Moderate (conscious) sedation was employed during this procedure. A total of Versed 1.5 mg and Fentanyl 75 mcg was administered intravenously by the radiology nurse. Total intra-service moderate Sedation Time: 24 minutes. The patient's level of consciousness and vital signs were monitored continuously by radiology nursing throughout the procedure under my direct supervision. COMPLICATIONS: None PROCEDURE: Informed written consent was obtained from the patient after a thorough discussion of the procedural risks, benefits and alternatives. All questions were addressed. Maximal Sterile Barrier Technique was utilized including caps, mask, sterile gowns, sterile gloves, sterile drape, hand hygiene and skin antiseptic. A timeout was performed prior to the initiation of the procedure. Patient was positioned supine on the CT gantry table. Scout CT acquired for planning purposes. The patient is prepped and draped in the usual sterile fashion. One sent lidocaine was used for local anesthesia. Using CT guidance, a trocar needle was advanced from an anterior approach through the fluid collection of the anterior abdominal wall in into the pericolonic  abscess. At the same time, an oblique approach with a Yueh needle was used to approach the anterior abdominal wall fluid collection. During the course of the study, the majority of the fluid in the anterior abdominal wall abscess decompressed itself through the lesion in the skin. By the completion, there was no significant fluid 4 attempted drainage placement or aspiration in the anterior abdominal wall. Once the trocar needle was in position within the abdominal abscess, modified Seldinger technique was used to place a 10 [place] drain. A proximally 30 cc of frankly purulent material aspirated. Final CT was acquired demonstrating decreased fluid in the anterior abdominal wall, decreased fluid in the abscess in the abdomen. The drain was sutured in position attached to bulb suction. Patient tolerated the procedure well and remained hemodynamically stable throughout. No complications were encountered and no significant blood loss. IMPRESSION: Status post CT-guided drainage of abdominal abscess adjacent to the left colon. During the course of the exam the majority of fluid in the anterior abdominal wall collection decompressed through the skin. No drain was attempted at this location. Signed, Yvone Neu. Miachel Roux, RPVI [facility] Radiology Electronically Signed   By: Gilmer Mor D.O.   On: 04/28/2023 15:57   CT ABDOMEN PELVIS W CONTRAST Result Date: 04/27/2023 CLINICAL DATA:  Diverticulitis, complication suspected known fisula and abscess Patient reports increased drainage from left lower quadrant operative site. Drain removed 3 months ago. EXAM: CT ABDOMEN AND PELVIS WITH CONTRAST TECHNIQUE: Multidetector CT imaging of the abdomen and pelvis was performed using  the standard protocol following bolus administration of intravenous contrast. RADIATION DOSE REDUCTION: This exam was performed according to the departmental dose-optimization program which includes  automated exposure control, adjustment of the mA and/or kV according to patient size and/or use of iterative reconstruction technique. CONTRAST:  75mL OMNIPAQUE IOHEXOL 350 MG/ML SOLN COMPARISON:  CT 10/26/2022 FINDINGS: Lower chest: No basilar airspace disease or pleural effusion. Hepatobiliary: Scattered hepatic hypodensities are too small to characterize but unchanged from prior exam, likely cysts. Moderate gallbladder distension. Small calcified gallstones. No pericholecystic inflammation. No biliary dilatation. Pancreas: No ductal dilatation or inflammation. Spleen: Normal in size without focal abnormality.  Small splenule. Adrenals/Urinary Tract: Normal adrenal glands. No hydronephrosis or perinephric edema. Homogeneous renal enhancement. Lobulated renal contours. Unchanged right renal cyst. No further follow-up imaging is recommended. Urinary bladder is physiologically distended without wall thickening. Stomach/Bowel: Unremarkable appearance of the stomach. No small bowel obstruction or inflammation. The appendix is normal. Colonic diverticulosis without acute diverticulitis. Vascular/Lymphatic: Aortic atherosclerosis. No aneurysm. The portal vein is patent. Prominent mesenteric nodes measuring up to 10 mm. This is nonspecific. Reproductive: Prostate is unremarkable. Other: At site of prior drainage catheter there is a left lower quadrant tract extending from the skin surface to the abdominal wall. In the deep subcutaneous tissues is a area of soft tissue density with internal air measuring 4.3 x 3.6 cm. Only minimal internal fluid. Surrounding fat stranding and inflammation. This does not definitively connect to the intra-abdominal contents or subjacent bowel. No free air or ascites. Musculoskeletal: L4-L5 fusion hardware. There are no acute or suspicious osseous abnormalities. IMPRESSION: 1. Heterogeneous collection in the left abdominal wall at site of prior drainage catheter. Tract extends from the skin  surface to the abdominal wall. In the deep subcutaneous tissues is a area of soft tissue density with internal air measuring 4.3 x 3.6 cm. Only minimal internal fluid. Surrounding fat stranding and inflammation. This does not definitively connect to the intra-abdominal contents or subjacent bowel. 2. Colonic diverticulosis without acute diverticulitis. 3. Cholelithiasis. Electronically Signed   By: Narda Rutherford M.D.   On: 04/27/2023 20:48    Labs:  CBC: Recent Labs    04/27/23 1810 04/28/23 0102 04/29/23 0543 04/30/23 1009  WBC 7.9 8.7 7.3 8.8  HGB 13.0 11.4* 10.6* 10.5*  HCT 39.7 35.1* 32.3* 31.3*  PLT 373 291 284 276    COAGS: No results for input(s): "INR", "APTT" in the last 8760 hours.  BMP: Recent Labs    04/27/23 1810 04/28/23 0102 04/29/23 0543 04/30/23 0921  NA 137 136 134* 134*  K 3.6 3.6 3.5 3.5  CL 96* 96* 97* 100  CO2 26 28 26 24   GLUCOSE 121* 147* 199* 129*  BUN 33* 33* 33* 21  CALCIUM 9.4 8.6* 8.3* 8.2*  CREATININE 2.24* 2.40* 2.35* 1.63*  GFRNONAA 33* 30* 31* 48*    LIVER FUNCTION TESTS: Recent Labs    08/02/22 1608 10/01/22 0829 02/21/23 1544 04/27/23 1810  BILITOT 0.8 1.0 0.3 0.7  AST 19 13* 11 22  ALT 18 10 11 30   ALKPHOS 79 56 93 95  PROT 7.2 7.1 7.1 7.9  ALBUMIN 3.9 2.9* 3.8* 3.1*    Assessment and Plan:  63 y.o. male inpatient. Known to IR. History of CVA, MI, HTN, DVT, CAD, diverticulitis. IR placed a LLQ abscess into a diverticular abscess with fistula on 7.6.24 removed on 11.22.24. Patient presented to the ED at [facility] on 1.30.25 with drainage from the prior IR drain site. CT Abd pelvis  from 1.30.25 reads Heterogeneous collection in the left abdominal wall at site of prior drainage catheter. Tract extends from the skin surface to the abdominal wall.  On 1.31.25 IR placed a LLQ to abscess drain to the recurrent fluid collection with 30 ml of purulent output aspirated. Copius amounts of output noted to be coming from the prior abscess  drain track.   Drain Location: LLQ Size: Fr size: 10 Fr Date of placement: 1.31.25  Currently to: Drain collection device: suction bulb 24 hour output:  Output by Drain (mL) 04/28/23 0700 - 04/28/23 1459 04/28/23 1500 - 04/28/23 2259 04/28/23 2300 - 04/29/23 0659 04/29/23 0700 - 04/29/23 1459 04/29/23 1500 - 04/29/23 2259 04/29/23 2300 - 04/30/23 0659 04/30/23 0700 - 04/30/23 1459 04/30/23 1500 - 04/30/23 1940  Closed System Drain Lateral LUQ 10 Fr. 40  55  8     Cultures shows streptococci and e.coli  Interval imaging/drain manipulation:  None since drain placement  Current examination: Flushes/aspirates easily.  Insertion site unremarkable. Suture and stat lock in place. Dressed appropriately.   Plan: Continue TID flushes with 5 cc NS. Record output Q shift. Dressing changes QD or PRN if soiled.  Call IR APP or on call IR MD if difficulty flushing or sudden change in drain output.   Current plans per general surgery. IR will continue to follow - please call with questions or concerns.    Electronically Signed: Alene Mires, NP 04/30/2023, 7:40 PM   I spent a total of 15 Minutes at the patient's bedside AND on the patient's hospital floor or unit, greater than 50% of which was counseling/coordinating care for LLQ diverticular abscess with fistula

## 2023-04-30 NOTE — Progress Notes (Signed)
Subjective: CC: Pain near drain and fistula that is stable.  N.p.o. currently.  No nausea or vomiting.  No BM.  Objective: Vital signs in last 24 hours: Temp:  [98.1 F (36.7 C)-98.4 F (36.9 C)] 98.4 F (36.9 C) (02/02 0815) Pulse Rate:  [67-74] 73 (02/02 0815) Resp:  [17-18] 18 (02/02 0815) BP: (117-119)/(66-73) 119/73 (02/02 0815) SpO2:  [96 %-98 %] 96 % (02/02 0815)    Intake/Output from previous day: 02/01 0701 - 02/02 0700 In: 1489.7 [P.O.:1320; I.V.:164.7] Out: 8 [Drains:8] Intake/Output this shift: No intake/output data recorded.  PE: Gen:  Alert, NAD, pleasant Abd:  soft, left sided abdominal ttp. Left IR drain with brown thick output, 2 cm opening over the left mid abdomen with similar appearing brown thick output   Lab Results:  Recent Labs    04/28/23 0102 04/29/23 0543  WBC 8.7 7.3  HGB 11.4* 10.6*  HCT 35.1* 32.3*  PLT 291 284   BMET Recent Labs    04/28/23 0102 04/29/23 0543  NA 136 134*  K 3.6 3.5  CL 96* 97*  CO2 28 26  GLUCOSE 147* 199*  BUN 33* 33*  CREATININE 2.40* 2.35*  CALCIUM 8.6* 8.3*   PT/INR No results for input(s): "LABPROT", "INR" in the last 72 hours. CMP     Component Value Date/Time   NA 134 (L) 04/29/2023 0543   NA 142 02/21/2023 1544   K 3.5 04/29/2023 0543   CL 97 (L) 04/29/2023 0543   CO2 26 04/29/2023 0543   GLUCOSE 199 (H) 04/29/2023 0543   BUN 33 (H) 04/29/2023 0543   BUN 18 02/21/2023 1544   CREATININE 2.35 (H) 04/29/2023 0543   CALCIUM 8.3 (L) 04/29/2023 0543   PROT 7.9 04/27/2023 1810   PROT 7.1 02/21/2023 1544   ALBUMIN 3.1 (L) 04/27/2023 1810   ALBUMIN 3.8 (L) 02/21/2023 1544   AST 22 04/27/2023 1810   ALT 30 04/27/2023 1810   ALKPHOS 95 04/27/2023 1810   BILITOT 0.7 04/27/2023 1810   BILITOT 0.3 02/21/2023 1544   GFRNONAA 31 (L) 04/29/2023 0543   GFRAA 68 12/16/2019 0000   Lipase     Component Value Date/Time   LIPASE 24 10/01/2022 0829    Studies/Results: CT GUIDED  PERITONEAL/RETROPERITONEAL FLUID DRAIN BY PERC CATH Result Date: 04/28/2023 INDICATION: 63 year old male with a history of diverticular abscess, prior drainage. He presents emergency department with drainage through a skin tract. CT demonstrates fluid collection adjacent to the colon concerning for recurrent abscess. In addition he was small fluid collection in the anterior abdominal wall. EXAM: CT-GUIDED DRAINAGE OF INTRA-ABDOMINAL ABSCESS TECHNIQUE: Multidetector CT imaging of the abdomen was performed following the standard protocol without IV contrast. RADIATION DOSE REDUCTION: This exam was performed according to the departmental dose-optimization program which includes automated exposure control, adjustment of the mA and/or kV according to patient size and/or use of iterative reconstruction technique. MEDICATIONS: The patient is currently admitted to the hospital and receiving intravenous antibiotics. The antibiotics were administered within an appropriate time frame prior to the initiation of the procedure. ANESTHESIA/SEDATION: Moderate (conscious) sedation was employed during this procedure. A total of Versed 1.5 mg and Fentanyl 75 mcg was administered intravenously by the radiology nurse. Total intra-service moderate Sedation Time: 24 minutes. The patient's level of consciousness and vital signs were monitored continuously by radiology nursing throughout the procedure under my direct supervision. COMPLICATIONS: None PROCEDURE: Informed written consent was obtained from the patient after a thorough discussion of  the procedural risks, benefits and alternatives. All questions were addressed. Maximal Sterile Barrier Technique was utilized including caps, mask, sterile gowns, sterile gloves, sterile drape, hand hygiene and skin antiseptic. A timeout was performed prior to the initiation of the procedure. Patient was positioned supine on the CT gantry table. Scout CT acquired for planning purposes. The patient  is prepped and draped in the usual sterile fashion. One sent lidocaine was used for local anesthesia. Using CT guidance, a trocar needle was advanced from an anterior approach through the fluid collection of the anterior abdominal wall in into the pericolonic abscess. At the same time, an oblique approach with a Yueh needle was used to approach the anterior abdominal wall fluid collection. During the course of the study, the majority of the fluid in the anterior abdominal wall abscess decompressed itself through the lesion in the skin. By the completion, there was no significant fluid 4 attempted drainage placement or aspiration in the anterior abdominal wall. Once the trocar needle was in position within the abdominal abscess, modified Seldinger technique was used to place a 10 [place] drain. A proximally 30 cc of frankly purulent material aspirated. Final CT was acquired demonstrating decreased fluid in the anterior abdominal wall, decreased fluid in the abscess in the abdomen. The drain was sutured in position attached to bulb suction. Patient tolerated the procedure well and remained hemodynamically stable throughout. No complications were encountered and no significant blood loss. IMPRESSION: Status post CT-guided drainage of abdominal abscess adjacent to the left colon. During the course of the exam the majority of fluid in the anterior abdominal wall collection decompressed through the skin. No drain was attempted at this location. Signed, Yvone Neu. Miachel Roux, RPVI [facility] Radiology Electronically Signed   By: Gilmer Mor D.O.   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 This has been going on for 7 months now. He has lost over 100 pounds in the last year. He still has significant comorbidities of a fib, DM II, CHF, h/o stroke, OSA, h/o PE 2019  -I agree with Dr. Sheliah Hatch that the only way this fistula likely resolves is with surgery. The area in question is far away from the sigmoid colon originally, however, he has always drained stool from his wound so likely the track is present. Surgery would likely be difficult as the intestine in the area will be chronically inflamed and possibly densely scarred in. Due to the active fistula/abscess a staged procedure or life long colostomy would be the options.  He has seen people struggle with colostomy in the past but is more open to the option of surgery.  Appreciate cardiac evaluation for perioperative risk assessment and optimization.  Continue surgery discussions - Cardiology following  and obtaining a Lexiscan tomorrow  - Abx per ID - Drain per IR - placed 1/31 - WOCN for possible pouching and assistance with fistula peri-wound care - Albumin 3.1. Obtain pre-alb. Will discuss w/ MD if okay for PO intake or if we proceed w/ TPN - We will follow   FEN - NPO currently. See above.  VTE - 80 mg lovenox daily ID - zosyn 1/31--> Admit - inpatient  I reviewed nursing notes, Consultant (ID, IR and Cards) notes, last 24 h vitals and pain scores, last 48 h intake and output, last 24 h labs and trends, and last 24 h imaging results.   LOS: 2 days    Jacinto Halim , [facility] Surgery 04/30/2023, 9:27 AM Please see Amion for pager number during day hours  7:00am-4:30pm

## 2023-04-30 NOTE — Progress Notes (Addendum)
PROGRESS NOTE    Bradley Torres  ZOX:[id number] DOB: Dec 05, 1960 DOA: 04/27/2023 PCP: Mechele Claude, MD   Brief Narrative: Bradley Torres is a 63 y.o. male with a history of CAD, paroxysmal atrial fibrillation, diabetes mellitus type 2, gout, hyperlipidemia, hypertension, obesity class III, OSA, OHS, pulm hypertension, chronic heart failure, chronic back pain, CKD stage IIIa.  Patient presented secondary to an abdominal wall wound with feculent/purulent drainage and was found to have evidence of an abdominal wall abscess.  Empiric antibiotics started, blood cultures obtained, interventional radiology consulted and placed a percutaneous drain for a recurrent LLQ abscess. ID consulted.   Assessment/Plan:  Intraabdominal abscess CT abdomen/pelvis this admission was significant for an abscess measuring 4.3 x 3.6 cm without clear evidence of a connection to the intra-abdominal contents/subadjacent bowel.  Empiric Zosyn started.  Interventional radiology was consulted and placed drain; superficial abscess was noted to have decompressed itself during IR's procedure. Wound cultures significant for GAS and GNRs with final identifications pending -ID recommendations: continue Zosyn IV -Follow-up wound culture data -Follow-up blood cultures  Chronic colocutaneous fistula Although no fistula tract seen on imaging, clinically correlates. General surgery consulted and plan for surgical intervention. ID consulted. Cardiology consulted for perioperative risk assessment/reduction -Continue Zosyn IV -General surgery recommendations: plan for surgical management -Cardiology recommendations: plan for stress test  AKI on CKD stage IIIa Patient's baseline creatinine is about 1.4.  Patient with a BUN/creatinine of 33/2.2 respectively on admission.  CT on admission without evidence of hydronephrosis.  Creatinine peak of 2.40. Improved to 1.63 with IV fluids. -Continue IV fluids  Paroxysmal atrial  fibrillation Currently in sinus rhythm.  Patient is on Xarelto, amiodarone as an outpatient.  Xarelto held secondary to anticipated procedure.  Amiodarone held on admission secondary to prolonged QTc.  QTc slightly decreased from admission this morning. Cardiology cleared to restart amiodarone -Continue home amiodarone  QTc prolongation Initial QTc measured to be 550 ms, subsequently down to 538 ms. -Continue telemetry  Chronic heart failure with mildly reduced ejection fraction Stable.  Last LVEF of 50 to 55% with associated moderately reduced RV systolic function.  Patient appears euvolemic at this time.  History of CVA Noted.  Patient on Xarelto as an outpatient which was held secondary to anticipated procedure.  Patient is also on Lipitor. -Continue Lipitor  Gout -Continue allopurinol  Hyperlipidemia -Continue Lipitor  CAD Noted.  Primary hypertension Patient is on hydralazine, chlorthalidone, valsartan as an outpatient.  Antihypertensives were held on admission.  Patient is currently normotensive with hypotension initially on presentation. -Continue to hold antihypertensives  Diabetes mellitus, type II Controlled with hemoglobin A1c of 6.5%.  Patient is managed on Januvia, metformin, Marcelline Deist as an outpatient.  SSI started on admission. -Continue SSI  History of DVT/PE Remote history from 2019.  Patient is on Xarelto as an outpatient which is held on admission secondary to anticipated procedure. -Resume Xarelto once cleared by general surgery  Obesity, class III Estimated body mass index is 47.47 kg/m as calculated from the following:   Height as of this encounter: 6' (1.829 m).   Weight as of this encounter: 158.8 kg.  OSA/OHS -Continue CPAP nightly   DVT prophylaxis: SCDs Code Status:   Code Status: Full Code Family Communication: None at bedside Disposition Plan: Discharge home likely several days pending general surgery recommendation/management, ID  recommendations and transition to outpatient antibiotics   Consultants:  Interventional radiology General surgery Infectious disease Cardiology  Procedures:  CT drainage of LLQ recurrent abscess. New  9F drain.  Antimicrobials: Zosyn IV   Subjective: Patient without specific questions today.  Objective: BP 119/73 (BP Location: Right Arm)   Pulse 73   Temp 98.4 F (36.9 C) (Oral)   Resp 18   Ht 6' (1.829 m)   Wt (!) 158.8 kg   SpO2 96%   BMI 47.47 kg/m   Examination:  General exam: Appears calm and comfortable Respiratory system: Clear to auscultation. Respiratory effort normal. Cardiovascular system: S1 & S2 heard, RRR. Gastrointestinal system: Abdomen is nondistended, soft and nontender. Normal bowel sounds heard. Central nervous system: Alert and oriented. Skin: Mild dermatitis around abdominal incisions Psychiatry: Judgement and insight appear normal. Mood & affect appropriate.    Data Reviewed: I have personally reviewed following labs and imaging studies   Last CBC Lab Results  Component Value Date   WBC 8.8 04/30/2023   HGB 10.5 (L) 04/30/2023   HCT 31.3 (L) 04/30/2023   MCV 92.3 04/30/2023   MCH 31.0 04/30/2023   RDW 14.2 04/30/2023   PLT 276 04/30/2023     Last metabolic panel Lab Results  Component Value Date   GLUCOSE 129 (H) 04/30/2023   NA 134 (L) 04/30/2023   K 3.5 04/30/2023   CL 100 04/30/2023   CO2 24 04/30/2023   BUN 21 04/30/2023   CREATININE 1.63 (H) 04/30/2023   GFRNONAA 48 (L) 04/30/2023   CALCIUM 8.2 (L) 04/30/2023   PHOS 3.4 06/19/2022   PROT 7.9 04/27/2023   ALBUMIN 3.1 (L) 04/27/2023   LABGLOB 3.3 02/21/2023   AGRATIO 1.2 08/02/2022   BILITOT 0.7 04/27/2023   ALKPHOS 95 04/27/2023   AST 22 04/27/2023   ALT 30 04/27/2023   ANIONGAP 10 04/30/2023     Creatinine Clearance: Estimated Creatinine Clearance: 74.1 mL/min (A) (by C-G formula based on SCr of 1.63 mg/dL (H)).  Recent Results (from the past 240 hours)   Blood culture (routine x 2)     Status: None (Preliminary result)   Collection Time: 04/27/23  5:10 PM   Specimen: BLOOD  Result Value Ref Range Status   Specimen Description BLOOD SITE NOT SPECIFIED  Final   Special Requests   Final    BOTTLES DRAWN AEROBIC AND ANAEROBIC Blood Culture results may not be optimal due to an inadequate volume of blood received in culture bottles   Culture   Final    NO GROWTH 3 DAYS Performed at [facility], [address], [place], [place] [address]    Report Status PENDING  Incomplete  Blood culture (routine x 2)     Status: None (Preliminary result)   Collection Time: 04/27/23 10:30 PM   Specimen: BLOOD RIGHT HAND  Result Value Ref Range Status   Specimen Description BLOOD RIGHT HAND  Final   Special Requests   Final    BOTTLES DRAWN AEROBIC AND ANAEROBIC Blood Culture results may not be optimal due to an inadequate volume of blood received in culture bottles   Culture   Final    NO GROWTH 3 DAYS Performed at [facility], [address], [place], [place] [address]    Report Status PENDING  Incomplete  Aerobic/Anaerobic Culture w Gram Stain (surgical/deep wound)     Status: None (Preliminary result)   Collection Time: 04/28/23 12:02 PM   Specimen: Abdomen; Abscess  Result Value Ref Range Status   Specimen Description ABDOMEN  Final   Special Requests ABSCESS  Final   Gram Stain   Final  FEW WBC PRESENT, PREDOMINANTLY PMN ABUNDANT GRAM NEGATIVE RODS MODERATE GRAM POSITIVE COCCI IN PAIRS IN CLUSTERS FEW YEAST FEW GRAM POSITIVE RODS    Culture   Final    ABUNDANT STREPTOCOCCI, ALPHA HEMOLYTIC ABUNDANT GRAM NEGATIVE RODS CULTURE REINCUBATED FOR BETTER GROWTH Performed at [facility], [address], [place], [place] [address]    Report Status PENDING  Incomplete      Radiology Studies: CT GUIDED PERITONEAL/RETROPERITONEAL FLUID DRAIN BY PERC CATH Result Date: 04/28/2023 INDICATION: 63 year old male with a history  of diverticular abscess, prior drainage. He presents emergency department with drainage through a skin tract. CT demonstrates fluid collection adjacent to the colon concerning for recurrent abscess. In addition he was small fluid collection in the anterior abdominal wall. EXAM: CT-GUIDED DRAINAGE OF INTRA-ABDOMINAL ABSCESS TECHNIQUE: Multidetector CT imaging of the abdomen was performed following the standard protocol without IV contrast. RADIATION DOSE REDUCTION: This exam was performed according to the departmental dose-optimization program which includes automated exposure control, adjustment of the mA and/or kV according to patient size and/or use of iterative reconstruction technique. MEDICATIONS: The patient is currently admitted to the hospital and receiving intravenous antibiotics. The antibiotics were administered within an appropriate time frame prior to the initiation of the procedure. ANESTHESIA/SEDATION: Moderate (conscious) sedation was employed during this procedure. A total of Versed 1.5 mg and Fentanyl 75 mcg was administered intravenously by the radiology nurse. Total intra-service moderate Sedation Time: 24 minutes. The patient's level of consciousness and vital signs were monitored continuously by radiology nursing throughout the procedure under my direct supervision. COMPLICATIONS: None PROCEDURE: Informed written consent was obtained from the patient after a thorough discussion of the procedural risks, benefits and alternatives. All questions were addressed. Maximal Sterile Barrier Technique was utilized including caps, mask, sterile gowns, sterile gloves, sterile drape, hand hygiene and skin antiseptic. A timeout was performed prior to the initiation of the procedure. Patient was positioned supine on the CT gantry table. Scout CT acquired for planning purposes. The patient is prepped and draped in the usual sterile fashion. One sent lidocaine was used for local anesthesia. Using CT guidance, a  trocar needle was advanced from an anterior approach through the fluid collection of the anterior abdominal wall in into the pericolonic abscess. At the same time, an oblique approach with a Yueh needle was used to approach the anterior abdominal wall fluid collection. During the course of the study, the majority of the fluid in the anterior abdominal wall abscess decompressed itself through the lesion in the skin. By the completion, there was no significant fluid 4 attempted drainage placement or aspiration in the anterior abdominal wall. Once the trocar needle was in position within the abdominal abscess, modified Seldinger technique was used to place a 10 [place] drain. A proximally 30 cc of frankly purulent material aspirated. Final CT was acquired demonstrating decreased fluid in the anterior abdominal wall, decreased fluid in the abscess in the abdomen. The drain was sutured in position attached to bulb suction. Patient tolerated the procedure well and remained hemodynamically stable throughout. No complications were encountered and no significant blood loss. IMPRESSION: Status post CT-guided drainage of abdominal abscess adjacent to the left colon. During the course of the exam the majority of fluid in the anterior abdominal wall collection decompressed through the skin. No drain was attempted at this location. Signed, Yvone Neu. Miachel Roux, RPVI [facility] Radiology Electronically Signed   By: Gilmer Mor D.O.   On: 04/28/2023  15:57      LOS: 2 days    Jacquelin Hawking, MD [facility] 04/30/2023, 10:36 AM   If 7PM-7AM, please contact night-coverage [email or web address]

## 2023-04-30 NOTE — Plan of Care (Signed)

## 2023-04-30 NOTE — Progress Notes (Signed)
   Overnight consult note reviewed - I agree with recommendations provided by Dr. Derrell Lolling. Bradley Torres has known CAD and multiple cardiac risk factors and would be at least intermediate risk for surgery (RCRI 2, 10.1%). Given his comorbities, it is difficult to assess his exercise tolerance and further risk stratify him. Since his surgery is not emergent, but also not clearly elective, would make sense to further risk stratify him with ischemia evaluation, however, significantly abnormal findings could delay surgery. Probably the least invasive would be a lexiscan myoview stress test. If there are plans for him to have surgery this hospitalization, we can likely get that done tomorrow.  Otherwise, could be scheduled as an outpatient.  Will follow-up on consultant recommendations today- please let me know what the consensus is. I will be rounding this coming week as well.  Chrystie Nose, MD, [facility], FACP  [place]  [facility]  Medical Director of the [facility] Lipid [facility]  Cardiovascular Risk Reduction Clinic Diplomate of the American Board of Clinical Lipidology Attending Cardiologist  Direct Dial: [phone]  Fax: [phone]  Website:  [email or web address]

## 2023-04-30 NOTE — Progress Notes (Signed)
Dr. Rennis Golden recommended to proceed with stress Myoview tomorrow, called the patient for update, he is agreeable, please maintain n.p.o. after midnight.

## 2023-04-30 NOTE — Progress Notes (Signed)
Skin around JP site and open incision with excoriation and erythema d/t purulent drainage. Site cleansed with NS and barrier film applied. WOCN consult placed.

## 2023-04-30 NOTE — Plan of Care (Signed)
  Problem: Education: Goal: Ability to describe self-care measures that may prevent or decrease complications (Diabetes Survival Skills Education) will improve Outcome: Progressing   Problem: Coping: Goal: Ability to adjust to condition or change in health will improve Outcome: Progressing   Problem: Metabolic: Goal: Ability to maintain appropriate glucose levels will improve Outcome: Progressing   Problem: Skin Integrity: Goal: Risk for impaired skin integrity will decrease Outcome: Progressing   Problem: Tissue Perfusion: Goal: Adequacy of tissue perfusion will improve Outcome: Progressing   Problem: Health Behavior/Discharge Planning: Goal: Ability to manage health-related needs will improve Outcome: Progressing   Problem: Nutrition: Goal: Adequate nutrition will be maintained Outcome: Progressing   Problem: Elimination: Goal: Will not experience complications related to bowel motility Outcome: Progressing   Problem: Pain Managment: Goal: General experience of comfort will improve and/or be controlled Outcome: Progressing   Problem: Safety: Goal: Ability to remain free from injury will improve Outcome: Progressing

## 2023-04-30 NOTE — Consult Note (Signed)
[facility]                                                                                        Patient Identification: Patient Name: Bradley Torres MRN: [id number] Admit Date: 04/27/2023  4:55 PM Today's Date: 04/30/2023 Reason for consult: Intra-abdominal infection Requesting provider: Dr. Caleb Popp  Principal Problem:   Abdominal wall abscess Active Problems:   Hypertension associated with diabetes (HCC)   Acute renal failure superimposed on stage 3a chronic kidney disease (HCC)   Chronic heart failure with preserved ejection fraction (HFpEF) (HCC)   Paroxysmal atrial fibrillation (HCC)   QT prolongation   Antibiotics:  Zosyn 1/30- Metronidazole 1/30  Lines/Hardware:  Assessment # Abdominal wall abscess (no clear evidence of connection to intra-abdominal contents/sunjacent per CT) 1/31 Status post CT-guided drainage of abdominal abscess adjacent to the left colon. Cx alpha hemolytic strep and GNR  # Chronic draining colocutaneous fistula -General Surgery following and possible plan for intervening. Cardiology consulted for perioperative risk assessment.  # AKI on CKD-setting of sepsis, on IVF, primary managing  Recommendations  - Continue Zosyn - Fu IR aspirate cx to completion  - Monitor CBC and CMP - Following cultures as well as surgical plans peripherally for abtx recs, please call with active questions or concerns  Rest of the management as per the primary team. Please call with questions or concerns.  Thank you for the consult  __________________________________________________________________________________________________________ HPI and Hospital Course: 63 year old male with PMH of pA-fib on [facility], DVT/PE in 2019, DM2, HTN, CKD, HLD, pulmonary hypertension, CHFpEF, gout, CAD, CVA, morbid obesity/OSA on CPAP, chronic back pain, history of perforated sigmoid  diverticulitis with abscess and JP drain placement in October 01 2022 ( cx with multiple organisms with mixed anaerobic flora, discharged in 14 days augmentin) with persist with drain removal in February 17, 2023 who presented to the ED on 1/30 with redness and purulent versus feculent drainage from left lower quadrant at previous drain removal site. Associated symptoms include nausea, dizziness. No objective fevers or chills.   At ED hypotensive, afebrile Labs remarkable for lactic acid 3.5, AKI with creatinine 2.2, WBC 7.9 Imaging as below Was given IVF, Zosyn, metronidazole Dilaudid and Compazine IR consulted 1/30 blood culture 2/2 sets no growth in 2 days  ROS: General- Denies fever, chills, loss of appetite and loss of weight HEENT - Denies headache, blurry vision, neck pain, sinus pain Chest - Denies any chest pain, SOB or cough CVS- Denies any syncopal attacks, palpitations Abdomen- Denies any vomiting, abdominal pain, hematochezia and diarrhea Neuro - Denies any weakness, numbness, tingling sensation Psych - Denies any changes in mood irritability or depressive symptoms GU- Denies any burning, dysuria, hematuria or increased frequency of urination Skin - denies any rashes/lesions MSK - denies any joint pain/swelling or restricted ROM   Past Medical History:  Diagnosis Date   CAD (coronary artery disease)    a. 12/2017: cath showing 10% Proximal-LAD stenosis with no significant obstructive disease and LVEDP mildly elevated at 18 mm Hg.    Chronic back pain    Diabetes mellitus, type 2 (HCC)  DVT (deep venous thrombosis) (HCC)    Essential hypertension    Family hx of colon cancer 05/03/2017   MI (myocardial infarction) (HCC) 2019   Morbid obesity (HCC)    Panic disorder    Shoulder pain, left    Following fall   Sleep apnea    Stroke (HCC) 01/2018   Vitamin D deficiency    Past Surgical History:  Procedure Laterality Date   Ankle fracture sugery Left    BIOPSY   07/20/2020   Procedure: BIOPSY;  Surgeon: Corbin Ade, MD;  [facility];  Service: Endoscopy;;  cecal polyp   COLONOSCOPY WITH PROPOFOL N/A 05/29/2017   Dr. Jena Gauss: Multiple tubular adenomas removed, prep inadequate.  Short interval surveillance colonoscopy recommended in 6 months   COLONOSCOPY WITH PROPOFOL N/A 07/20/2020   Procedure: COLONOSCOPY WITH PROPOFOL;  Surgeon: Corbin Ade, MD;  Location: [facility];  Service: Endoscopy;  Laterality: N/A;  am appt, early as possible since diabetic   INTERVENTIONAL RADIOLOGY PROCEDURE  10/08/2022   JP drain placement due to diverticular abscess   IR CATHETER TUBE CHANGE  01/16/2023   IR RADIOLOGIST EVAL & MGMT  10/26/2022   IR RADIOLOGIST EVAL & MGMT  01/04/2023   IR RADIOLOGIST EVAL & MGMT  01/30/2023   IR RADIOLOGIST EVAL & MGMT  02/17/2023   LEFT HEART CATH AND CORONARY ANGIOGRAPHY N/A 01/23/2018   Procedure: LEFT HEART CATH AND CORONARY ANGIOGRAPHY;  Surgeon: Lennette Bihari, MD;  Location: [facility];  Service: Cardiovascular;  Laterality: N/A;   LUMBAR SPINE SURGERY     fusion   POLYPECTOMY  05/29/2017   Procedure: POLYPECTOMY;  Surgeon: Corbin Ade, MD;  Location: [facility];  Service: Endoscopy;;  ascending colon polyp x5-cs transverse colon polyp x4- cs descending colon polyp x1- hs sigmoid colon polyp x1 -hs rectal polyp x1- hs   TONSILLECTOMY      Scheduled Meds:  allopurinol  100 mg Oral Daily   atorvastatin  20 mg Oral Daily   enoxaparin (LOVENOX) injection  80 mg Subcutaneous Q24H   insulin aspart  0-9 Units Subcutaneous TID WC   polyethylene glycol  17 g Oral Daily   sodium chloride flush  5 mL Intracatheter Q8H   Continuous Infusions:  sodium chloride 40 mL/hr at 04/29/23 1817   piperacillin-tazobactam (ZOSYN)  IV 3.375 g (04/30/23 0017)   PRN Meds:.acetaminophen **OR** acetaminophen, naLOXone (NARCAN)  injection, oxyCODONE-acetaminophen **AND** oxyCODONE  Allergies  Allergen  Reactions   Aldactone [Spironolactone] Hives   Bystolic [Nebivolol Hcl] Swelling   Motrin [Ibuprofen] Other (See Comments)    Rectal bleed   Actos [Pioglitazone] Other (See Comments) and Cough    Dyspnea  Edema   Trulicity [Dulaglutide] Other (See Comments) and Cough    Shortness of breath GI intolerance   Zestril [Lisinopril] Other (See Comments) and Cough    Wheezing    Cozaar [Losartan] Rash   Diflucan [Fluconazole] Rash   Hctz [Hydrochlorothiazide] Other (See Comments)    Gout flares.   Norvasc [Amlodipine] Itching and Other (See Comments)    Myalgias    Social History   Socioeconomic History   Marital status: Single    Spouse name: Not on file   Number of children: Not on file   Years of education: Not on file   Highest education level: Not on file  Occupational History   Occupation: disabled  Tobacco Use   Smoking status: Never    Passive exposure: Never  Smokeless tobacco: Never  Vaping Use   Vaping status: Never Used  Substance and Sexual Activity   Alcohol use: Yes    Comment: occ   Drug use: Not Currently    Types: Marijuana    Comment: daily; 05/19/20 no marijuana in past 6 months d/t going to pain management clinic   Sexual activity: Not Currently    Birth control/protection: None  Other Topics Concern   Not on file  Social History Narrative   Not on file   Social Drivers of Health   Financial Resource Strain: Medium Risk (05/18/2020)   Overall Financial Resource Strain (CARDIA)    Difficulty of Paying Living Expenses: Somewhat hard  Food Insecurity: No Food Insecurity (04/28/2023)   Hunger Vital Sign    Worried About Running Out of Food in the Last Year: Never true    Ran Out of Food in the Last Year: Never true  Transportation Needs: No Transportation Needs (04/28/2023)   PRAPARE - Administrator, Civil Service (Medical): No    Lack of Transportation (Non-Medical): No  Physical Activity: Not on file  Stress: Stress Concern Present  (05/15/2019)   [employer] of Occupational Health - Occupational Stress Questionnaire    Feeling of Stress : Rather much  Social Connections: Unknown (01/16/2020)   Social Connection and Isolation Panel [NHANES]    Frequency of Communication with Friends and Family: More than three times a week    Frequency of Social Gatherings with Friends and Family: More than three times a week    Attends Religious Services: Not on file    Active Member of Clubs or Organizations: Not on file    Attends Banker Meetings: Not on file    Marital Status: Not on file  Intimate Partner Violence: Not At Risk (04/28/2023)   Humiliation, Afraid, Rape, and Kick questionnaire    Fear of Current or Ex-Partner: No    Emotionally Abused: No    Physically Abused: No    Sexually Abused: No   Family History  Problem Relation Age of Onset   Colon cancer Mother        died at age 63   Heart attack Father    Hepatitis Sister 43       Autoimmune   Heart attack Paternal Grandfather     Vitals BP 117/66 (BP Location: Right Wrist)   Pulse 74   Temp 98.2 F (36.8 C) (Oral)   Resp 17   Ht 6' (1.829 m)   Wt (!) 158.8 kg   SpO2 97%   BMI 47.47 kg/m    Physical Exam Constitutional: Adult obese male lying in the bed, not in acute distress    Comments: HEENT WNL  Cardiovascular:     Rate and Rhythm: Normal rate and regular rhythm.     Heart sounds: S1 and S2  Pulmonary:     Effort: Pulmonary effort is normal.     Comments: Normal breath sounds  Abdominal:     Palpations: Abdomen is soft.     Tenderness: Nondistended, LLQ JP drain with poor smelling, dirty appearing fluid in bulb, no surrounding cellulitis  Musculoskeletal:        General: No swelling or tenderness in peripheral joints  Skin:    Comments: No rashes  Neurological:     General: Awake, alert and oriented, following commands  Psychiatric:        Mood and Affect: Mood normal.    Pertinent Microbiology Results for  orders placed or performed during the hospital encounter of 04/27/23  Blood culture (routine x 2)     Status: None (Preliminary result)   Collection Time: 04/27/23  5:10 PM   Specimen: BLOOD  Result Value Ref Range Status   Specimen Description BLOOD SITE NOT SPECIFIED  Final   Special Requests   Final    BOTTLES DRAWN AEROBIC AND ANAEROBIC Blood Culture results may not be optimal due to an inadequate volume of blood received in culture bottles   Culture   Final    NO GROWTH 2 DAYS Performed at [facility], [address] [place], [place] [address]    Report Status PENDING  Incomplete  Blood culture (routine x 2)     Status: None (Preliminary result)   Collection Time: 04/27/23 10:30 PM   Specimen: BLOOD RIGHT HAND  Result Value Ref Range Status   Specimen Description BLOOD RIGHT HAND  Final   Special Requests   Final    BOTTLES DRAWN AEROBIC AND ANAEROBIC Blood Culture results may not be optimal due to an inadequate volume of blood received in culture bottles   Culture   Final    NO GROWTH 2 DAYS Performed at [facility], [address] [place], [place] [address]    Report Status PENDING  Incomplete  Aerobic/Anaerobic Culture w Gram Stain (surgical/deep wound)     Status: None (Preliminary result)   Collection Time: 04/28/23 12:02 PM   Specimen: Abdomen; Abscess  Result Value Ref Range Status   Specimen Description ABDOMEN  Final   Special Requests ABSCESS  Final   Gram Stain   Final    FEW WBC PRESENT, PREDOMINANTLY PMN ABUNDANT GRAM NEGATIVE RODS MODERATE GRAM POSITIVE COCCI IN PAIRS IN CLUSTERS FEW YEAST FEW GRAM POSITIVE RODS    Culture   Final    ABUNDANT STREPTOCOCCI, ALPHA HEMOLYTIC ABUNDANT GRAM NEGATIVE RODS CULTURE REINCUBATED FOR BETTER GROWTH Performed at [facility], [address], [place], [place] [address]    Report Status PENDING  Incomplete   Pertinent Lab seen by me:    Latest Ref Rng & Units 04/29/2023    5:43 AM 04/28/2023     1:02 AM 04/27/2023    6:10 PM  CBC  WBC 4.0 - 10.5 K/uL 7.3  8.7  7.9   Hemoglobin 13.0 - 17.0 g/dL 13.0  86.5  78.4   Hematocrit 39.0 - 52.0 % 32.3  35.1  39.7   Platelets 150 - 400 K/uL 284  291  373       Latest Ref Rng & Units 04/29/2023    5:43 AM 04/28/2023    1:02 AM 04/27/2023    6:10 PM  CMP  Glucose 70 - 99 mg/dL 696  295  284   BUN 8 - 23 mg/dL 33  33  33   Creatinine 0.61 - 1.24 mg/dL 1.32  4.40  1.02   Sodium 135 - 145 mmol/L 134  136  137   Potassium 3.5 - 5.1 mmol/L 3.5  3.6  3.6   Chloride 98 - 111 mmol/L 97  96  96   CO2 22 - 32 mmol/L 26  28  26    Calcium 8.9 - 10.3 mg/dL 8.3  8.6  9.4   Total Protein 6.5 - 8.1 g/dL   7.9   Total Bilirubin 0.0 - 1.2 mg/dL   0.7   Alkaline Phos 38 - 126 U/L   95   AST 15 -  41 U/L   22   ALT 0 - 44 U/L   30      Pertinent Imagings/Other Imagings Plain films and CT images have been personally visualized and interpreted; radiology reports have been reviewed. Decision making incorporated into the Impression / Recommendations.  CT GUIDED PERITONEAL/RETROPERITONEAL FLUID DRAIN BY PERC CATH Result Date: 04/28/2023 INDICATION: 63 year old male with a history of diverticular abscess, prior drainage. He presents emergency department with drainage through a skin tract. CT demonstrates fluid collection adjacent to the colon concerning for recurrent abscess. In addition he was small fluid collection in the anterior abdominal wall. EXAM: CT-GUIDED DRAINAGE OF INTRA-ABDOMINAL ABSCESS TECHNIQUE: Multidetector CT imaging of the abdomen was performed following the standard protocol without IV contrast. RADIATION DOSE REDUCTION: This exam was performed according to the departmental dose-optimization program which includes automated exposure control, adjustment of the mA and/or kV according to patient size and/or use of iterative reconstruction technique. MEDICATIONS: The patient is currently admitted to the hospital and receiving intravenous antibiotics.  The antibiotics were administered within an appropriate time frame prior to the initiation of the procedure. ANESTHESIA/SEDATION: Moderate (conscious) sedation was employed during this procedure. A total of Versed 1.5 mg and Fentanyl 75 mcg was administered intravenously by the radiology nurse. Total intra-service moderate Sedation Time: 24 minutes. The patient's level of consciousness and vital signs were monitored continuously by radiology nursing throughout the procedure under my direct supervision. COMPLICATIONS: None PROCEDURE: Informed written consent was obtained from the patient after a thorough discussion of the procedural risks, benefits and alternatives. All questions were addressed. Maximal Sterile Barrier Technique was utilized including caps, mask, sterile gowns, sterile gloves, sterile drape, hand hygiene and skin antiseptic. A timeout was performed prior to the initiation of the procedure. Patient was positioned supine on the CT gantry table. Scout CT acquired for planning purposes. The patient is prepped and draped in the usual sterile fashion. One sent lidocaine was used for local anesthesia. Using CT guidance, a trocar needle was advanced from an anterior approach through the fluid collection of the anterior abdominal wall in into the pericolonic abscess. At the same time, an oblique approach with a Yueh needle was used to approach the anterior abdominal wall fluid collection. During the course of the study, the majority of the fluid in the anterior abdominal wall abscess decompressed itself through the lesion in the skin. By the completion, there was no significant fluid 4 attempted drainage placement or aspiration in the anterior abdominal wall. Once the trocar needle was in position within the abdominal abscess, modified Seldinger technique was used to place a 10 [place] drain. A proximally 30 cc of frankly purulent material aspirated. Final CT was acquired demonstrating decreased fluid in the  anterior abdominal wall, decreased fluid in the abscess in the abdomen. The drain was sutured in position attached to bulb suction. Patient tolerated the procedure well and remained hemodynamically stable throughout. No complications were encountered and no significant blood loss. IMPRESSION: Status post CT-guided drainage of abdominal abscess adjacent to the left colon. During the course of the exam the majority of fluid in the anterior abdominal wall collection decompressed through the skin. No drain was attempted at this location. Signed, Yvone Neu. Miachel Roux, RPVI [facility] Radiology Electronically Signed   By: Gilmer Mor D.O.   On: 04/28/2023 15:57   CT ABDOMEN PELVIS W CONTRAST Result Date: 04/27/2023 CLINICAL DATA:  Diverticulitis, complication suspected known fisula and abscess Patient reports increased drainage from  left lower quadrant operative site. Drain removed 3 months ago. EXAM: CT ABDOMEN AND PELVIS WITH CONTRAST TECHNIQUE: Multidetector CT imaging of the abdomen and pelvis was performed using the standard protocol following bolus administration of intravenous contrast. RADIATION DOSE REDUCTION: This exam was performed according to the departmental dose-optimization program which includes automated exposure control, adjustment of the mA and/or kV according to patient size and/or use of iterative reconstruction technique. CONTRAST:  75mL OMNIPAQUE IOHEXOL 350 MG/ML SOLN COMPARISON:  CT 10/26/2022 FINDINGS: Lower chest: No basilar airspace disease or pleural effusion. Hepatobiliary: Scattered hepatic hypodensities are too small to characterize but unchanged from prior exam, likely cysts. Moderate gallbladder distension. Small calcified gallstones. No pericholecystic inflammation. No biliary dilatation. Pancreas: No ductal dilatation or inflammation. Spleen: Normal in size without focal abnormality.  Small splenule. Adrenals/Urinary Tract:  Normal adrenal glands. No hydronephrosis or perinephric edema. Homogeneous renal enhancement. Lobulated renal contours. Unchanged right renal cyst. No further follow-up imaging is recommended. Urinary bladder is physiologically distended without wall thickening. Stomach/Bowel: Unremarkable appearance of the stomach. No small bowel obstruction or inflammation. The appendix is normal. Colonic diverticulosis without acute diverticulitis. Vascular/Lymphatic: Aortic atherosclerosis. No aneurysm. The portal vein is patent. Prominent mesenteric nodes measuring up to 10 mm. This is nonspecific. Reproductive: Prostate is unremarkable. Other: At site of prior drainage catheter there is a left lower quadrant tract extending from the skin surface to the abdominal wall. In the deep subcutaneous tissues is a area of soft tissue density with internal air measuring 4.3 x 3.6 cm. Only minimal internal fluid. Surrounding fat stranding and inflammation. This does not definitively connect to the intra-abdominal contents or subjacent bowel. No free air or ascites. Musculoskeletal: L4-L5 fusion hardware. There are no acute or suspicious osseous abnormalities. IMPRESSION: 1. Heterogeneous collection in the left abdominal wall at site of prior drainage catheter. Tract extends from the skin surface to the abdominal wall. In the deep subcutaneous tissues is a area of soft tissue density with internal air measuring 4.3 x 3.6 cm. Only minimal internal fluid. Surrounding fat stranding and inflammation. This does not definitively connect to the intra-abdominal contents or subjacent bowel. 2. Colonic diverticulosis without acute diverticulitis. 3. Cholelithiasis. Electronically Signed   By: Narda Rutherford M.D.   On: 04/27/2023 20:48   I have personally spent 83  minutes involved in face-to-face and non-face-to-face activities for this patient on the day of the visit. Professional time spent includes the following activities: Preparing to see  the patient (review of tests), Obtaining and/or reviewing separately obtained history (admission/discharge record), Performing a medically appropriate examination and/or evaluation , Ordering medications/tests/procedures, referring and communicating with other health care professionals, Documenting clinical information in the EMR, Independently interpreting results (not separately reported), Communicating results to the patient/family/caregiver, Counseling and educating the patient/family/caregiver and Care coordination (not separately reported).  Electronically signed by:   Plan d/w requesting provider as well as ID pharm D  Of note, portions of this note may have been created with voice recognition software. While this note has been edited for accuracy, occasional wrong-word or 'sound-a-like' substitutions may have occurred due to the inherent limitations of voice recognition software.   Odette Fraction, MD Infectious Disease Physician [facility] for Infect[facility] Disease Pager: [phone]

## 2023-05-01 ENCOUNTER — Inpatient Hospital Stay (HOSPITAL_COMMUNITY): Payer: Medicaid Other

## 2023-05-01 DIAGNOSIS — N179 Acute kidney failure, unspecified: Secondary | ICD-10-CM | POA: Diagnosis not present

## 2023-05-01 DIAGNOSIS — R634 Abnormal weight loss: Secondary | ICD-10-CM | POA: Diagnosis not present

## 2023-05-01 DIAGNOSIS — I480 Paroxysmal atrial fibrillation: Secondary | ICD-10-CM

## 2023-05-01 DIAGNOSIS — E1159 Type 2 diabetes mellitus with other circulatory complications: Secondary | ICD-10-CM | POA: Diagnosis not present

## 2023-05-01 DIAGNOSIS — N1831 Chronic kidney disease, stage 3a: Secondary | ICD-10-CM | POA: Diagnosis not present

## 2023-05-01 DIAGNOSIS — Z01810 Encounter for preprocedural cardiovascular examination: Secondary | ICD-10-CM | POA: Diagnosis not present

## 2023-05-01 DIAGNOSIS — K632 Fistula of intestine: Secondary | ICD-10-CM | POA: Diagnosis not present

## 2023-05-01 DIAGNOSIS — R9439 Abnormal result of other cardiovascular function study: Secondary | ICD-10-CM | POA: Diagnosis not present

## 2023-05-01 DIAGNOSIS — E662 Morbid (severe) obesity with alveolar hypoventilation: Secondary | ICD-10-CM | POA: Diagnosis not present

## 2023-05-01 DIAGNOSIS — I5022 Chronic systolic (congestive) heart failure: Secondary | ICD-10-CM | POA: Diagnosis not present

## 2023-05-01 DIAGNOSIS — K5720 Diverticulitis of large intestine with perforation and abscess without bleeding: Secondary | ICD-10-CM | POA: Diagnosis not present

## 2023-05-01 DIAGNOSIS — K5732 Diverticulitis of large intestine without perforation or abscess without bleeding: Secondary | ICD-10-CM | POA: Diagnosis not present

## 2023-05-01 DIAGNOSIS — E1122 Type 2 diabetes mellitus with diabetic chronic kidney disease: Secondary | ICD-10-CM | POA: Diagnosis not present

## 2023-05-01 DIAGNOSIS — I152 Hypertension secondary to endocrine disorders: Secondary | ICD-10-CM | POA: Diagnosis not present

## 2023-05-01 DIAGNOSIS — K651 Peritoneal abscess: Secondary | ICD-10-CM | POA: Diagnosis not present

## 2023-05-01 DIAGNOSIS — R9431 Abnormal electrocardiogram [ECG] [EKG]: Secondary | ICD-10-CM | POA: Diagnosis not present

## 2023-05-01 DIAGNOSIS — Z6841 Body Mass Index (BMI) 40.0 and over, adult: Secondary | ICD-10-CM | POA: Diagnosis not present

## 2023-05-01 DIAGNOSIS — I272 Pulmonary hypertension, unspecified: Secondary | ICD-10-CM | POA: Diagnosis not present

## 2023-05-01 DIAGNOSIS — L02211 Cutaneous abscess of abdominal wall: Secondary | ICD-10-CM | POA: Diagnosis not present

## 2023-05-01 DIAGNOSIS — I5032 Chronic diastolic (congestive) heart failure: Secondary | ICD-10-CM | POA: Diagnosis not present

## 2023-05-01 LAB — NM MYOCAR MULTI W/SPECT W/WALL MOTION / EF
Base ST Depression (mm): 0 mm
LV dias vol: 150 mL (ref 62–150)
MPHR: 159 {beats}/min
Nuc Stress EF: 49 %
Peak HR: 82 {beats}/min
Percent HR: 51 %
Rest HR: 68 {beats}/min
Rest Nuclear Isotope Dose: 10.7 mCi
ST Depression (mm): 0 mm
Stress Nuclear Isotope Dose: 31.3 mCi
TID: 1.07

## 2023-05-01 LAB — CBC
HCT: 32 % — ABNORMAL LOW (ref 39.0–52.0)
Hemoglobin: 10.5 g/dL — ABNORMAL LOW (ref 13.0–17.0)
MCH: 30.5 pg (ref 26.0–34.0)
MCHC: 32.8 g/dL (ref 30.0–36.0)
MCV: 93 fL (ref 80.0–100.0)
Platelets: 305 10*3/uL (ref 150–400)
RBC: 3.44 MIL/uL — ABNORMAL LOW (ref 4.22–5.81)
RDW: 14 % (ref 11.5–15.5)
WBC: 9.2 10*3/uL (ref 4.0–10.5)
nRBC: 0 % (ref 0.0–0.2)

## 2023-05-01 LAB — GLUCOSE, CAPILLARY
Glucose-Capillary: 125 mg/dL — ABNORMAL HIGH (ref 70–99)
Glucose-Capillary: 138 mg/dL — ABNORMAL HIGH (ref 70–99)
Glucose-Capillary: 194 mg/dL — ABNORMAL HIGH (ref 70–99)

## 2023-05-01 LAB — COMPREHENSIVE METABOLIC PANEL
ALT: 13 U/L (ref 0–44)
AST: 13 U/L — ABNORMAL LOW (ref 15–41)
Albumin: 2.4 g/dL — ABNORMAL LOW (ref 3.5–5.0)
Alkaline Phosphatase: 67 U/L (ref 38–126)
Anion gap: 10 (ref 5–15)
BUN: 17 mg/dL (ref 8–23)
CO2: 26 mmol/L (ref 22–32)
Calcium: 8.3 mg/dL — ABNORMAL LOW (ref 8.9–10.3)
Chloride: 99 mmol/L (ref 98–111)
Creatinine, Ser: 1.54 mg/dL — ABNORMAL HIGH (ref 0.61–1.24)
GFR, Estimated: 51 mL/min — ABNORMAL LOW (ref >60)
Glucose, Bld: 114 mg/dL — ABNORMAL HIGH (ref 70–99)
Potassium: 3.5 mmol/L (ref 3.5–5.1)
Sodium: 135 mmol/L (ref 135–145)
Total Bilirubin: 0.5 mg/dL (ref 0.0–1.2)
Total Protein: 6.4 g/dL — ABNORMAL LOW (ref 6.5–8.1)

## 2023-05-01 LAB — C-REACTIVE PROTEIN: CRP: 10.4 mg/dL — ABNORMAL HIGH (ref <1.0)

## 2023-05-01 LAB — PREALBUMIN: Prealbumin: 11 mg/dL — ABNORMAL LOW (ref 18–38)

## 2023-05-01 MED ORDER — REGADENOSON 0.4 MG/5ML IV SOLN
0.4000 mg | Freq: Once | INTRAVENOUS | Status: AC
  Administered 2023-05-01: 0.4 mg via INTRAVENOUS
  Filled 2023-05-01: qty 5

## 2023-05-01 MED ORDER — AMINOPHYLLINE 25 MG/ML IV SOLN
INTRAVENOUS | Status: AC
  Filled 2023-05-01: qty 10

## 2023-05-01 MED ORDER — TECHNETIUM TC 99M TETROFOSMIN IV KIT
31.3000 | PACK | Freq: Once | INTRAVENOUS | Status: AC | PRN
  Administered 2023-05-01: 31.3 via INTRAVENOUS

## 2023-05-01 MED ORDER — TECHNETIUM TC 99M TETROFOSMIN IV KIT
10.7000 | PACK | Freq: Once | INTRAVENOUS | Status: AC | PRN
  Administered 2023-05-01: 10.7 via INTRAVENOUS

## 2023-05-01 MED ORDER — REGADENOSON 0.4 MG/5ML IV SOLN
INTRAVENOUS | Status: AC
Start: 2023-05-01 — End: ?
  Filled 2023-05-01: qty 5

## 2023-05-01 NOTE — Progress Notes (Signed)
Pt tolerated lexiscan. VS remained WNL.

## 2023-05-01 NOTE — Progress Notes (Signed)
Subjective: CC: Pain near drain and fistula that is stable.  N.p.o. currently.  No nausea or vomiting.  Last BM 2/1  Objective: Vital signs in last 24 hours: Temp:  [97.8 F (36.6 C)-98.4 F (36.9 C)] 97.8 F (36.6 C) (02/03 0501) Pulse Rate:  [68-73] 71 (02/03 0827) Resp:  [17-18] 17 (02/03 0827) BP: (122-134)/(68-80) 122/68 (02/03 0827) SpO2:  [93 %-99 %] 93 % (02/03 0827) Last BM Date : 04/29/23  Intake/Output from previous day: 02/02 0701 - 02/03 0700 In: 550 [P.O.:540] Out: 40 [Drains:40] Intake/Output this shift: No intake/output data recorded.  PE: Gen:  Alert, NAD, pleasant Abd:  soft, left sided abdominal ttp. Left IR drain with brown thick output, 2 cm opening over the left mid abdomen with similar appearing brown thick output   Lab Results:  Recent Labs    04/30/23 1009 05/01/23 0521  WBC 8.8 9.2  HGB 10.5* 10.5*  HCT 31.3* 32.0*  PLT 276 305   BMET Recent Labs    04/30/23 0921 05/01/23 0521  NA 134* 135  K 3.5 3.5  CL 100 99  CO2 24 26  GLUCOSE 129* 114*  BUN 21 17  CREATININE 1.63* 1.54*  CALCIUM 8.2* 8.3*   PT/INR No results for input(s): "LABPROT", "INR" in the last 72 hours. CMP     Component Value Date/Time   NA 135 05/01/2023 0521   NA 142 02/21/2023 1544   K 3.5 05/01/2023 0521   CL 99 05/01/2023 0521   CO2 26 05/01/2023 0521   GLUCOSE 114 (H) 05/01/2023 0521   BUN 17 05/01/2023 0521   BUN 18 02/21/2023 1544   CREATININE 1.54 (H) 05/01/2023 0521   CALCIUM 8.3 (L) 05/01/2023 0521   PROT 6.4 (L) 05/01/2023 0521   PROT 7.1 02/21/2023 1544   ALBUMIN 2.4 (L) 05/01/2023 0521   ALBUMIN 3.8 (L) 02/21/2023 1544   AST 13 (L) 05/01/2023 0521   ALT 13 05/01/2023 0521   ALKPHOS 67 05/01/2023 0521   BILITOT 0.5 05/01/2023 0521   BILITOT 0.3 02/21/2023 1544   GFRNONAA 51 (L) 05/01/2023 0521   GFRAA 68 12/16/2019 0000   Lipase     Component Value Date/Time   LIPASE 24 10/01/2022 0829    Studies/Results: No results  found.   Anti-infectives: Anti-infectives (From admission, onward)    Start     Dose/Rate Route Frequency Ordered Stop   04/28/23 0045  piperacillin-tazobactam (ZOSYN) IVPB 3.375 g        3.375 g 12.5 mL/hr over 240 Minutes Intravenous Every 8 hours 04/28/23 0036     04/27/23 1845  piperacillin-tazobactam (ZOSYN) IVPB 3.375 g        3.375 g 100 mL/hr over 30 Minutes Intravenous  Once 04/27/23 1842 04/27/23 2006   04/27/23 1845  metroNIDAZOLE (FLAGYL) IVPB 500 mg        500 mg 100 mL/hr over 60 Minutes Intravenous  Once 04/27/23 1842 04/27/23 2117        Assessment/Plan 63 yo male with chronically draining colocutaneous fistula. This has been going on for 7 months now. He has lost over 100 pounds in the last year. He still has significant comorbidities of a fib, DM II, CHF, h/o stroke, OSA, h/o PE 2019  - Cardiology following and obtaining a Lexiscan today  - Abx per ID - Drain per IR - placed 1/31 - WOCN for possible pouching and assistance with fistula peri-wound care - Albumin 2.4. Pre-alb 11.  - Will  follow up on cardiology results to determine peri-operative risk and allow for more discussions on risks vs benefits of surgery. I agree with Dr. Sheliah Hatch that the only way this fistula likely resolves is with surgery. The area in question is far away from the sigmoid colon originally, however, he has always drained stool from his wound so likely the track is present. Surgery would likely be difficult as the intestine in the area will be chronically inflamed and possibly densely scarred in. Due to the active fistula/abscess a staged procedure or life long colostomy would be the options.  He has seen people struggle with colostomy in the past and is hesitant to proceed w/ surgery if it would involve a colostomy. Will need to have further discussions after cardiac eval.  - We will follow   FEN - Okay for diet.  VTE - 80 mg lovenox daily ID - zosyn 1/31--> Admit - inpatient  I  reviewed nursing notes, Consultant (ID, IR and Cards) notes, last 24 h vitals and pain scores, last 48 h intake and output, last 24 h labs and trends, and last 24 h imaging results.   LOS: 3 days    Jacinto Halim , [facility] 05/01/2023, 9:15 AM Please see Amion for pager number during day hours 7:00am-4:30pm

## 2023-05-01 NOTE — Progress Notes (Signed)
Referring Provider(s): L. Kinsinger    Supervising Physician: Roanna Banning  Patient Status:  [facility] - In-pt  Chief Complaint:  Diverticular abscess Colocutaneous fistula    Brief History:  Previous diverticular abscess with prior drainage.  Presented to ED with skin tract drainage.  IR placed new drain LLQ on 04/28/23.  Subjective:  Pt feeling better today, talkative - still very odorous output from drain obvious in room   Allergies: Aldactone [spironolactone], Bystolic [nebivolol hcl], Motrin [ibuprofen], Actos [pioglitazone], Trulicity [dulaglutide], Zestril [lisinopril], Cozaar [losartan], Diflucan [fluconazole], Hctz [hydrochlorothiazide], and Norvasc [amlodipine]  Medications: Prior to Admission medications   Medication Sig Start Date End Date Taking? Authorizing Provider  allopurinol (ZYLOPRIM) 100 MG tablet TAKE ONE TABLET BY MOUTH ONCE DAILY 04/04/23  Yes Stacks, Broadus John, MD  amiodarone (PACERONE) 200 MG tablet Take 1 tablet (200 mg total) by mouth daily. 01/12/23  Yes Antoine Poche, MD  atorvastatin (LIPITOR) 20 MG tablet TAKE ONE TABLET BY MOUTH ONCE DAILY 08/01/22  Yes Branch, Dorothe Pea, MD  chlorthalidone (HYGROTON) 25 MG tablet TAKE 1 TABLET BY MOUTH EVERY OTHER DAY 01/20/23  Yes Sharlene Dory, NP  Cholecalciferol (VITAMIN D-3 PO) Take 1 tablet by mouth daily.   Yes [provider]  dapagliflozin propanediol (FARXIGA) 10 MG TABS tablet TAKE ONE TABLET BY MOUTH DAILY BEFORE BREAKFAST 04/05/23  Yes Stacks, Broadus John, MD  docusate sodium (COLACE) 100 MG capsule Take 1 capsule (100 mg total) by mouth 2 (two) times daily. Patient taking differently: Take 100 mg by mouth 2 (two) times daily as needed (constipation.). 06/28/22  Yes Osvaldo Shipper, MD  hydrALAZINE (APRESOLINE) 25 MG tablet Take 1 tablet (25 mg total) by mouth daily. 09/08/22  Yes Sharlene Dory, NP  JANUVIA 100 MG tablet TAKE ONE TABLET BY MOUTH ONCE DAILY 04/05/23  Yes Mechele Claude, MD  metFORMIN  (GLUCOPHAGE-XR) 750 MG 24 hr tablet Take 2 tablets (1,500 mg total) by mouth daily with breakfast. Patient taking differently: Take 750 mg by mouth daily with breakfast. 05/11/22  Yes Mechele Claude, MD  oxyCODONE-acetaminophen (PERCOCET) 10-325 MG tablet Take 1 tablet by mouth 3 (three) times daily as needed for pain. Patient taking differently: Take 1 tablet by mouth every 6 (six) hours as needed for pain. 06/28/22  Yes Osvaldo Shipper, MD  polyethylene glycol (MIRALAX / GLYCOLAX) 17 g packet Take 17 g by mouth daily. Patient taking differently: Take 17 g by mouth daily as needed (constipation.). 06/28/22  Yes Osvaldo Shipper, MD  Prasterone, DHEA, (DHEA PO) Take 1 capsule by mouth daily.   Yes [provider]  rivaroxaban (XARELTO) 20 MG TABS tablet TAKE ONE TABLET BY MOUTH DAILY WITH SUPPER 03/20/23  Yes Stacks, Broadus John, MD  senna-docusate (SENOKOT-S) 8.6-50 MG tablet Take 2 tablets by mouth at bedtime. Patient taking differently: Take 2 tablets by mouth at bedtime as needed (constipation). 06/28/22  Yes Osvaldo Shipper, MD  valsartan (DIOVAN) 160 MG tablet TAKE 1 TABLET BY MOUTH EVERY DAY 03/15/23  Yes Mechele Claude, MD  Accu-Chek Softclix Lancets lancets Use as instructed to monitor glucose twice daily 09/23/21   Dani Gobble, NP  glucose blood (ACCU-CHEK GUIDE) test strip Use as instructed to monitor glucose twice daily 10/28/21   Dani Gobble, NP     Vital Signs: BP 122/68 (BP Location: Right Arm)   Pulse 71   Temp 97.8 F (36.6 C) (Oral)   Resp 17   Ht 6' (1.829 m)   Wt (!) 350 lb (158.8 kg)  SpO2 93%   BMI 47.47 kg/m   Physical Exam Skin:    Comments: Dry, clean, no infection noted; obvious stool output surrounding drain  Flushes and aspirates easily - no pain  Output in JP is purulent/feculent  ~20ccs in JP       Labs:  CBC: Recent Labs    04/28/23 0102 04/29/23 0543 04/30/23 1009 05/01/23 0521  WBC 8.7 7.3 8.8 9.2  HGB 11.4* 10.6* 10.5* 10.5*   HCT 35.1* 32.3* 31.3* 32.0*  PLT 291 284 276 305    COAGS: No results for input(s): "INR", "APTT" in the last 8760 hours.  BMP: Recent Labs    04/28/23 0102 04/29/23 0543 04/30/23 0921 05/01/23 0521  NA 136 134* 134* 135  K 3.6 3.5 3.5 3.5  CL 96* 97* 100 99  CO2 28 26 24 26   GLUCOSE 147* 199* 129* 114*  BUN 33* 33* 21 17  CALCIUM 8.6* 8.3* 8.2* 8.3*  CREATININE 2.40* 2.35* 1.63* 1.54*  GFRNONAA 30* 31* 48* 51*    LIVER FUNCTION TESTS: Recent Labs    10/01/22 0829 02/21/23 1544 04/27/23 1810 05/01/23 0521  BILITOT 1.0 0.3 0.7 0.5  AST 13* 11 22 13*  ALT 10 11 30 13   ALKPHOS 56 93 95 67  PROT 7.1 7.1 7.9 6.4*  ALBUMIN 2.9* 3.8* 3.1* 2.4*   Drain Location: LLQ Size: Fr size: 10 Fr Date of placement: 04/28/23  Currently to: Drain collection device: suction bulb 24 hour output:  Output by Drain (mL) 04/29/23 0701 - 04/29/23 1900 04/29/23 1901 - 04/30/23 0700 04/30/23 0701 - 04/30/23 1900 04/30/23 1901 - 05/01/23 0700 05/01/23 0701 - 05/01/23 0948  Closed System Drain Lateral LUQ 10 Fr. 8   40 40    Interval imaging/drain manipulation:  None   Current examination: Flushes/aspirates easily.  Insertion site unremarkable. Suture and stat lock in place. Dressed appropriately.  Feculent output.    Plan: Continue TID flushes with 5 cc NS. Record output Q shift. Dressing changes QD or PRN if soiled.  Call IR APP or on call IR MD if difficulty flushing or sudden change in drain output.  Repeat imaging/possible drain injection once output < 10 mL/QD (excluding flush material). Consideration for drain removal if output is < 10 mL/QD (excluding flush material), pending discussion with the providing surgical service.  Discharge planning: Please contact IR APP or on call IR MD prior to patient d/c to ensure appropriate follow up plans are in place. Typically patient will follow up with IR clinic 10-14 days post d/c for repeat imaging/possible drain injection. IR  scheduler will contact patient with date/time of appointment. Patient will need to flush drain QD with 5 cc NS, record output QD, dressing changes every 2-3 days or earlier if soiled.   IR will continue to follow - please call with questions or concerns.     Assessment and Plan:  IR will follow abscess drain with CCS  CCS consulting with cardiology - will check chart for plan.    Electronically Signed: [name], PA-C 05/01/2023, 9:48 AM    I spent a total of 15 Minutes at the the patient's bedside AND on the patient's hospital floor or unit, greater than 50% of which was counseling/coordinating care for LLQ abscess drain.

## 2023-05-01 NOTE — Plan of Care (Signed)
  Problem: Fluid Volume: Goal: Ability to maintain a balanced intake and output will improve Outcome: Progressing   Problem: Nutritional: Goal: Progress toward achieving an optimal weight will improve Outcome: Progressing   Problem: Education: Goal: Knowledge of General Education information will improve Description: Including pain rating scale, medication(s)/side effects and non-pharmacologic comfort measures Outcome: Progressing   Problem: Activity: Goal: Risk for activity intolerance will decrease Outcome: Progressing   Problem: Nutrition: Goal: Adequate nutrition will be maintained Outcome: Progressing   Problem: Coping: Goal: Level of anxiety will decrease Outcome: Progressing

## 2023-05-01 NOTE — Progress Notes (Signed)
Patient Name: Bradley Torres Date of Encounter: 05/01/2023 [facility] Cardiologist: Dina Rich, MD   Interval Summary  .    No chest pain or shortness of breath.  Vital Signs .    Vitals:   04/30/23 0815 04/30/23 1544 04/30/23 2053 05/01/23 0501  BP: 119/73 125/80 123/76 134/69  Pulse: 73 68 68 73  Resp: 18 17 18 18   Temp: 98.4 F (36.9 C) 98.4 F (36.9 C) 98.4 F (36.9 C) 97.8 F (36.6 C)  TempSrc: Oral Oral Oral Oral  SpO2: 96% 99% 98% 98%  Weight:      Height:        Intake/Output Summary (Last 24 hours) at 05/01/2023 0748 Last data filed at 04/30/2023 2302 Gross per 24 hour  Intake 550 ml  Output 40 ml  Net 510 ml      04/27/2023    5:02 PM 02/21/2023    3:42 PM 02/07/2023    3:14 PM  Last 3 Weights  Weight (lbs) 350 lb 347 lb 9.6 oz 337 lb  Weight (kg) 158.759 kg 157.67 kg 152.862 kg      Telemetry/ECG    Sinus rhythm heart rates in the 60s- Personally Reviewed  CV Studies    Echocardiogram 06/21/2022 1. Left ventricular ejection fraction, by estimation, is 50 to 55% -  difficuly evaluation in the setting of rapid atrial fibrillation and  variable contraction from beat to beat. There is intermittent septal  bounce in association with respiration. The  left ventricle has low normal function. The left ventricle demonstrates  global hypokinesis. Left ventricular diastolic parameters are  indeterminate.   2. Right ventricular systolic function is moderately reduced. The right  ventricular size is mildly enlarged. There is mildly elevated pulmonary  artery systolic pressure. The estimated right ventricular systolic  pressure is 42.2 mmHg.   3. Left atrial size was mildly dilated.   4. The mitral valve is grossly normal. Trivial mitral valve  regurgitation.   5. The aortic valve is tricuspid. Aortic valve regurgitation is not  visualized.   6. The inferior vena cava is dilated in size with <50% respiratory  variability, suggesting right  atrial pressure of 15 mmHg.   Comparison(s): Prior images reviewed side by side. Difficult to compare  since patient currently in rapid atrial fibrillation. Consider follow-up  limited study once back in sinus rhythm.   Left heart catheterization 01/23/2018 Prox LAD lesion is 10% stenosed. The left ventricular ejection fraction is 50-55% by visual estimate. LV end diastolic pressure is mildly elevated. The left ventricular systolic function is normal.   No significant coronary obstructive disease with essentially normal coronary arteries and only mild luminal irregularity of the proximal LAD of 10%; normal left circumflex and normal dominant RCA.   Low normal global LV function with an ejection fraction of 50 to 55% without definitive focal segmental wall motion abnormalities.  LVEDP is 18 mmHg.   RECOMMENDATION: Medical therapy.  Weight loss is essential.  The patient should be evaluated for obstructive sleep apnea.   No indication for antiplatelet therapy at this time.  Physical Exam .   GEN: No acute distress.   Neck: No JVD Cardiac: RRR, no murmurs, rubs, or gallops.  Respiratory: Clear to auscultation bilaterally. GI: Soft, nontender, non-distended  MS: No edema  Patient Profile    Bradley Torres is a 63 y.o. male has hx of mild nonobstructive CAD, PAF, pulmonary hypertension, OSA not on CPAP, DVT, PE, CVA.  Cardiology seen  for preoperative evaluation.  Assessment & Plan .     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  Does not demonstrate any concerning symptoms for CAD progression, though METS difficult to assess for him.  He does have moderately reduced RV function, mildly elevated RVSP, 42 and refuses to wear his CPAP which also poses greater risk for anesthesia.  RCRI equals 2 indicating 10.1% risk of MACE.   He will go  for Lexiscan today, depending on results surgery plans for surgical intervention versus minimally invasive approach.   PAF Maintaining NSR here.  On therapeutic Lovenox transition back to DOAC per surgery.  Continue amiodarone 20 mg daily  Prolonged QTc 538 on last EKG.  Repeat today.   For questions or updates, please contact [facility] Please consult [email or web address] for contact info under        Signed, Abagail Kitchens, PA-C

## 2023-05-01 NOTE — Progress Notes (Signed)
PROGRESS NOTE    Bradley Torres  ZOX:[id number] DOB: 1960/06/03 DOA: 04/27/2023 PCP: Mechele Claude, MD   Brief Narrative: Bradley Torres is a 63 y.o. male with a history of CAD, paroxysmal atrial fibrillation, diabetes mellitus type 2, gout, hyperlipidemia, hypertension, obesity class III, OSA, OHS, pulm hypertension, chronic heart failure, chronic back pain, CKD stage IIIa.  Patient presented secondary to an abdominal wall wound with feculent/purulent drainage and was found to have evidence of an abdominal wall abscess.  Empiric antibiotics started, blood cultures obtained, interventional radiology consulted and placed a percutaneous drain for a recurrent LLQ abscess. ID consulted.   Assessment/Plan:  Intraabdominal abscess CT abdomen/pelvis this admission was significant for an abscess measuring 4.3 x 3.6 cm without clear evidence of a connection to the intra-abdominal contents/subadjacent bowel.  Empiric Zosyn started.  Interventional radiology was consulted and placed drain; superficial abscess was noted to have decompressed itself during IR's procedure. Wound cultures significant for GAS and E. Coli. -ID recommendations: Continue Zosyn IV; pending today -Follow-up blood cultures  Chronic colocutaneous fistula Although no fistula tract seen on imaging, clinically correlates. General surgery consulted and plan for surgical intervention. ID consulted. Cardiology consulted for perioperative risk assessment/reduction -Continue Zosyn IV -General surgery recommendations: plan for surgical management -Cardiology recommendations: plan for stress test  AKI on CKD stage IIIa Patient's baseline creatinine is about 1.4.  Patient with a BUN/creatinine of 33/2.2 respectively on admission.  CT on admission without evidence of hydronephrosis.  Creatinine peak of 2.40. Improved with IV fluids. Creatinine down to 1.54 today, near baseline.  Paroxysmal atrial fibrillation Currently in sinus rhythm.   Patient is on Xarelto, amiodarone as an outpatient.  Xarelto held secondary to anticipated procedure.  Amiodarone held on admission secondary to prolonged QTc.  QTc slightly decreased from admission this morning. Cardiology cleared to restart amiodarone -Continue home amiodarone  QTc prolongation Initial QTc measured to be 550 ms, subsequently down to 538 ms. -Continue telemetry  Chronic heart failure with mildly reduced ejection fraction Stable.  Last LVEF of 50 to 55% with associated moderately reduced RV systolic function.  Patient appears euvolemic at this time.  History of CVA Noted.  Patient on Xarelto as an outpatient which was held secondary to anticipated procedure.  Patient is also on Lipitor. -Continue Lipitor  Gout -Continue allopurinol  Hyperlipidemia -Continue Lipitor  CAD Noted.  Primary hypertension Patient is on hydralazine, chlorthalidone, valsartan as an outpatient.  Antihypertensives were held on admission.  Patient is currently normotensive with hypotension initially on presentation. -Continue to hold antihypertensives  Diabetes mellitus, type II Controlled with hemoglobin A1c of 6.5%.  Patient is managed on Januvia, metformin, Marcelline Deist as an outpatient.  SSI started on admission. -Continue SSI  History of DVT/PE Remote history from 2019.  Patient is on Xarelto as an outpatient which is held on admission secondary to anticipated procedure. -Resume Xarelto once cleared by general surgery  Obesity, class III Estimated body mass index is 47.47 kg/m as calculated from the following:   Height as of this encounter: 6' (1.829 m).   Weight as of this encounter: 158.8 kg.  OSA/OHS -Continue CPAP nightly   DVT prophylaxis: SCDs Code Status:   Code Status: Full Code Family Communication: None at bedside Disposition Plan: Discharge home likely several days pending general surgery recommendation/management, ID recommendations and transition to outpatient  antibiotics   Consultants:  Interventional radiology General surgery Infectious disease Cardiology  Procedures:  CT drainage of LLQ recurrent abscess. New 62F drain.  Antimicrobials: Zosyn IV   Subjective: No concerns this morning. Awaiting cardiology procedure.  Objective: BP 122/68 (BP Location: Right Arm)   Pulse 71   Temp 97.8 F (36.6 C) (Oral)   Resp 17   Ht 6' (1.829 m)   Wt (!) 158.8 kg   SpO2 93%   BMI 47.47 kg/m   Examination:  General exam: Appears calm and comfortable Respiratory system: Respiratory effort normal. Central nervous system: Alert and oriented. No focal neurological deficits. Psychiatry: Judgement and insight appear normal. Mood & affect appropriate.    Data Reviewed: I have personally reviewed following labs and imaging studies   Last CBC Lab Results  Component Value Date   WBC 9.2 05/01/2023   HGB 10.5 (L) 05/01/2023   HCT 32.0 (L) 05/01/2023   MCV 93.0 05/01/2023   MCH 30.5 05/01/2023   RDW 14.0 05/01/2023   PLT 305 05/01/2023     Last metabolic panel Lab Results  Component Value Date   GLUCOSE 114 (H) 05/01/2023   NA 135 05/01/2023   K 3.5 05/01/2023   CL 99 05/01/2023   CO2 26 05/01/2023   BUN 17 05/01/2023   CREATININE 1.54 (H) 05/01/2023   GFRNONAA 51 (L) 05/01/2023   CALCIUM 8.3 (L) 05/01/2023   PHOS 3.4 06/19/2022   PROT 6.4 (L) 05/01/2023   ALBUMIN 2.4 (L) 05/01/2023   LABGLOB 3.3 02/21/2023   AGRATIO 1.2 08/02/2022   BILITOT 0.5 05/01/2023   ALKPHOS 67 05/01/2023   AST 13 (L) 05/01/2023   ALT 13 05/01/2023   ANIONGAP 10 05/01/2023     Creatinine Clearance: Estimated Creatinine Clearance: 78.4 mL/min (A) (by C-G formula based on SCr of 1.54 mg/dL (H)).  Recent Results (from the past 240 hours)  Blood culture (routine x 2)     Status: None (Preliminary result)   Collection Time: 04/27/23  5:10 PM   Specimen: BLOOD  Result Value Ref Range Status   Specimen Description BLOOD SITE NOT SPECIFIED  Final    Special Requests   Final    BOTTLES DRAWN AEROBIC AND ANAEROBIC Blood Culture results may not be optimal due to an inadequate volume of blood received in culture bottles   Culture   Final    NO GROWTH 4 DAYS Performed at [facility], [address], [place], [place] [address]    Report Status PENDING  Incomplete  Blood culture (routine x 2)     Status: None (Preliminary result)   Collection Time: 04/27/23 10:30 PM   Specimen: BLOOD RIGHT HAND  Result Value Ref Range Status   Specimen Description BLOOD RIGHT HAND  Final   Special Requests   Final    BOTTLES DRAWN AEROBIC AND ANAEROBIC Blood Culture results may not be optimal due to an inadequate volume of blood received in culture bottles   Culture   Final    NO GROWTH 4 DAYS Performed at [facility], [address] [place], [place] [address]    Report Status PENDING  Incomplete  Aerobic/Anaerobic Culture w Gram Stain (surgical/deep wound)     Status: None (Preliminary result)   Collection Time: 04/28/23 12:02 PM   Specimen: Abdomen; Abscess  Result Value Ref Range Status   Specimen Description ABDOMEN  Final   Special Requests ABSCESS  Final   Gram Stain   Final    FEW WBC PRESENT, PREDOMINANTLY PMN ABUNDANT GRAM NEGATIVE RODS MODERATE GRAM POSITIVE COCCI IN PAIRS IN CLUSTERS FEW YEAST FEW GRAM POSITIVE RODS  Culture   Final    ABUNDANT STREPTOCOCCUS INFANTARIUS ABUNDANT ESCHERICHIA COLI NO ANAEROBES ISOLATED; CULTURE IN PROGRESS FOR 5 DAYS SUSCEPTIBILITIES TO FOLLOW Performed at [facility], [address], [place], [place] [address]    Report Status PENDING  Incomplete   Organism ID, Bacteria ESCHERICHIA COLI  Final      Susceptibility   Escherichia coli - MIC*    AMPICILLIN >=32 RESISTANT Resistant     CEFEPIME <=0.12 SENSITIVE Sensitive     CEFTAZIDIME <=1 SENSITIVE Sensitive     CEFTRIAXONE <=0.25 SENSITIVE Sensitive     CIPROFLOXACIN 1 RESISTANT Resistant     GENTAMICIN <=1 SENSITIVE  Sensitive     IMIPENEM <=0.25 SENSITIVE Sensitive     TRIMETH/SULFA <=20 SENSITIVE Sensitive     AMPICILLIN/SULBACTAM 16 INTERMEDIATE Intermediate     PIP/TAZO <=4 SENSITIVE Sensitive ug/mL    * ABUNDANT ESCHERICHIA COLI      Radiology Studies: No results found.     LOS: 3 days    Jacquelin Hawking, MD [facility] 05/01/2023, 9:55 AM   If 7PM-7AM, please contact night-coverage [email or web address]

## 2023-05-01 NOTE — Consult Note (Signed)
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   Wound type: infectious/fistula Pressure Injury POA: NA Measurement: opening in midline abdominal incision:  1 cm round, flush stomatized fistula.  Feculent effluent draining from stoma and noted in perc drain to LLQ  Wound bed: pink moist stoma Drainage (amount, consistency, odor) moderate thick brown feculent effluent  pungent odor  Periwound:LLQ perc drain.  Irritant contact dermatitis, red, moist and weeping.  Tender to touch.  Stool was sitting on skin. Dressing procedure/placement/frequency:Cleansed skin around fistula and perc drain with soap and water.  Applied stoma powder and skin prep x 2 to peristomal skin.  Applied barrier ring.  Cut small Eakin pouch off center to accommodate perc drain. Pattern left at bedside.  Due to thickness of effluent, Cut off spout and applied pouch clip.  Will change when leaking.  Will follow.   Mike Gip MSN, RN, [name]-BC CWON Wound, Ostomy, Continence Nurse Outpatient [facility] [phone] Pager [phone]

## 2023-05-01 NOTE — Progress Notes (Incomplete)
Pt did not receive full dose of zosyn at 0800. Pt's IV in LAC was leaking and had to be removed. CN, Wilhemina Bonito, RN placed IV team consult for new IV. Pt received IV in RFA, shortly after pt left the floor to go to nuclear medicine for stress test. Notified Dr. Jacquelin Hawking.

## 2023-05-02 DIAGNOSIS — L02211 Cutaneous abscess of abdominal wall: Secondary | ICD-10-CM | POA: Diagnosis not present

## 2023-05-02 DIAGNOSIS — K632 Fistula of intestine: Secondary | ICD-10-CM | POA: Diagnosis not present

## 2023-05-02 DIAGNOSIS — I5032 Chronic diastolic (congestive) heart failure: Secondary | ICD-10-CM | POA: Diagnosis not present

## 2023-05-02 DIAGNOSIS — Z01810 Encounter for preprocedural cardiovascular examination: Secondary | ICD-10-CM | POA: Diagnosis not present

## 2023-05-02 DIAGNOSIS — K651 Peritoneal abscess: Secondary | ICD-10-CM | POA: Diagnosis not present

## 2023-05-02 DIAGNOSIS — R9439 Abnormal result of other cardiovascular function study: Secondary | ICD-10-CM | POA: Diagnosis not present

## 2023-05-02 DIAGNOSIS — I480 Paroxysmal atrial fibrillation: Secondary | ICD-10-CM | POA: Diagnosis not present

## 2023-05-02 DIAGNOSIS — E1159 Type 2 diabetes mellitus with other circulatory complications: Secondary | ICD-10-CM | POA: Diagnosis not present

## 2023-05-02 DIAGNOSIS — I152 Hypertension secondary to endocrine disorders: Secondary | ICD-10-CM | POA: Diagnosis not present

## 2023-05-02 DIAGNOSIS — K5732 Diverticulitis of large intestine without perforation or abscess without bleeding: Secondary | ICD-10-CM | POA: Diagnosis not present

## 2023-05-02 DIAGNOSIS — R9431 Abnormal electrocardiogram [ECG] [EKG]: Secondary | ICD-10-CM | POA: Diagnosis not present

## 2023-05-02 LAB — CULTURE, BLOOD (ROUTINE X 2)
Culture: NO GROWTH
Culture: NO GROWTH

## 2023-05-02 LAB — GLUCOSE, CAPILLARY
Glucose-Capillary: 112 mg/dL — ABNORMAL HIGH (ref 70–99)
Glucose-Capillary: 193 mg/dL — ABNORMAL HIGH (ref 70–99)
Glucose-Capillary: 149 mg/dL — ABNORMAL HIGH (ref 70–99)
Glucose-Capillary: 142 mg/dL — ABNORMAL HIGH (ref 70–99)

## 2023-05-02 MED ORDER — SODIUM CHLORIDE 0.9 % IV SOLN
2.0000 g | INTRAVENOUS | Status: DC
Start: 2023-05-02 — End: 2023-05-05
  Administered 2023-05-02 – 2023-05-04 (×3): 2 g via INTRAVENOUS
  Filled 2023-05-02 (×3): qty 20

## 2023-05-02 MED ORDER — ENSURE ENLIVE PO LIQD
237.0000 mL | Freq: Two times a day (BID) | ORAL | Status: DC
  Administered 2023-05-02 – 2023-05-03 (×3): 237 mL via ORAL

## 2023-05-02 MED ORDER — METRONIDAZOLE 500 MG PO TABS
500.0000 mg | ORAL_TABLET | Freq: Two times a day (BID) | ORAL | Status: DC
  Administered 2023-05-02 – 2023-05-05 (×6): 500 mg via ORAL
  Filled 2023-05-02 (×7): qty 1

## 2023-05-02 NOTE — Progress Notes (Signed)
 PROGRESS NOTE    Bradley Torres  FMW:[id number] DOB: 11-08-1960 DOA: 04/27/2023 PCP: Zollie Lowers, MD   Brief Narrative: Bradley Torres is a 63 y.o. male with a history of CAD, paroxysmal atrial fibrillation, diabetes mellitus type 2, gout, hyperlipidemia, hypertension, obesity class III, OSA, OHS, pulm hypertension, chronic heart failure, chronic back pain, CKD stage IIIa.  Patient presented secondary to an abdominal wall wound with feculent/purulent drainage and was found to have evidence of an abdominal wall abscess.  Empiric antibiotics started, blood cultures obtained, interventional radiology consulted and placed a percutaneous drain for a recurrent LLQ abscess. ID consulted. General surgery consulted with plan for surgical management. Cardiology consulted for perioperative risk assessment with stress test significant for ischemia; medical management recommended.   Assessment/Plan:  Intraabdominal abscess CT abdomen/pelvis this admission was significant for an abscess measuring 4.3 x 3.6 cm without clear evidence of a connection to the intra-abdominal contents/subadjacent bowel.  Empiric Zosyn  started.  Interventional radiology was consulted and placed drain; superficial abscess was noted to have decompressed itself during IR's procedure. Wound cultures significant for GAS and E. Coli. Blood cultures with no growth. -ID recommendations: Continue Zosyn  IV; pending today  Chronic colocutaneous fistula Although no fistula tract seen on imaging, clinically correlates. General surgery consulted and plan for surgical intervention. ID consulted. Cardiology consulted for perioperative risk assessment/reduction. Stress test performed for perioperative risk assessment which suggests ischemia. Cardiology plan for continued medical management. No cath recommended. Can hold Eliquis for two days prior to surgery and restart after surgery. Cardiology signed off. -Continue Zosyn  IV -General surgery  recommendations: plan for surgical management -Continue wound care  AKI on CKD stage IIIa Patient's baseline creatinine is about 1.4.  Patient with a BUN/creatinine of 33/2.2 respectively on admission.  CT on admission without evidence of hydronephrosis.  Creatinine peak of 2.40. Improved with IV fluids. Creatinine down to 1.54, near baseline.  Paroxysmal atrial fibrillation Currently in sinus rhythm.  Patient is on Xarelto , amiodarone  as an outpatient.  Xarelto  held secondary to anticipated procedure.  Amiodarone  held on admission secondary to prolonged QTc.  QTc slightly decreased from admission this morning. Cardiology cleared to restart amiodarone  -Continue home amiodarone  -Hold Eliquis pending general surgery recommendations for when patient is post-op  QTc prolongation Initial QTc measured to be 550 ms, subsequently down to 538 ms. -Continue telemetry  Chronic heart failure with mildly reduced ejection fraction Stable.  Last LVEF of 50 to 55% with associated moderately reduced RV systolic function.  Patient appears euvolemic at this time.  History of CVA Noted.  Patient on Xarelto  as an outpatient which was held secondary to anticipated procedure.  Patient is also on Lipitor . -Continue Lipitor   Gout -Continue allopurinol   Hyperlipidemia -Continue Lipitor   CAD Noted.  Primary hypertension Patient is on hydralazine , chlorthalidone , valsartan  as an outpatient.  Antihypertensives were held on admission.  Patient is currently normotensive with hypotension initially on presentation. -Continue to hold antihypertensives  Diabetes mellitus, type II Controlled with hemoglobin A1c of 6.5%.  Patient is managed on Januvia , metformin , Farxiga  as an outpatient.  SSI started on admission. -Continue SSI  History of DVT/PE Remote history from 2019.  Patient is on Xarelto  as an outpatient which is held on admission secondary to anticipated procedure. -Resume Xarelto  once cleared by  general surgery  Obesity, class III Estimated body mass index is 47.47 kg/m as calculated from the following:   Height as of this encounter: 6' (1.829 m).   Weight as of this encounter:  158.8 kg.  OSA/OHS -Continue CPAP nightly   DVT prophylaxis: SCDs Code Status:   Code Status: Full Code Family Communication: None at bedside Disposition Plan: Discharge home likely several days pending general surgery recommendation/management, ID recommendations and transition to outpatient antibiotics   Consultants:  Interventional radiology General surgery Infectious disease Cardiology  Procedures:  CT drainage of LLQ recurrent abscess. New 107F drain.  Antimicrobials: Zosyn  IV   Subjective: Patient without specific medical concerns except as it relates to planning for surgery.  Objective: BP (!) 133/92 (BP Location: Left Arm)   Pulse 74   Temp 98.3 F (36.8 C) (Oral)   Resp 18   Ht 6' (1.829 m)   Wt (!) 158.8 kg   SpO2 93%   BMI 47.47 kg/m   Examination:  General exam: Appears calm and comfortable Respiratory system: Clear to auscultation. Respiratory effort normal. Cardiovascular system: S1 & S2 heard, RRR. Gastrointestinal system: Abdomen is soft and nontender. Normal bowel sounds heard. Central nervous system: Alert and oriented. No focal neurological deficits. Psychiatry: Judgement and insight appear normal. Mood & affect appropriate.    Data Reviewed: I have personally reviewed following labs and imaging studies   Last CBC Lab Results  Component Value Date   WBC 9.2 05/01/2023   HGB 10.5 (L) 05/01/2023   HCT 32.0 (L) 05/01/2023   MCV 93.0 05/01/2023   MCH 30.5 05/01/2023   RDW 14.0 05/01/2023   PLT 305 05/01/2023     Last metabolic panel Lab Results  Component Value Date   GLUCOSE 114 (H) 05/01/2023   NA 135 05/01/2023   K 3.5 05/01/2023   CL 99 05/01/2023   CO2 26 05/01/2023   BUN 17 05/01/2023   CREATININE 1.54 (H) 05/01/2023   GFRNONAA 51 (L)  05/01/2023   CALCIUM  8.3 (L) 05/01/2023   PHOS 3.4 06/19/2022   PROT 6.4 (L) 05/01/2023   ALBUMIN  2.4 (L) 05/01/2023   LABGLOB 3.3 02/21/2023   AGRATIO 1.2 08/02/2022   BILITOT 0.5 05/01/2023   ALKPHOS 67 05/01/2023   AST 13 (L) 05/01/2023   ALT 13 05/01/2023   ANIONGAP 10 05/01/2023     Creatinine Clearance: Estimated Creatinine Clearance: 78.4 mL/min (A) (by C-G formula based on SCr of 1.54 mg/dL (H)).  Recent Results (from the past 240 hours)  Blood culture (routine x 2)     Status: None   Collection Time: 04/27/23  5:10 PM   Specimen: BLOOD  Result Value Ref Range Status   Specimen Description BLOOD SITE NOT SPECIFIED  Final   Special Requests   Final    BOTTLES DRAWN AEROBIC AND ANAEROBIC Blood Culture results may not be optimal due to an inadequate volume of blood received in culture bottles   Culture   Final    NO GROWTH 5 DAYS Performed at [facility] Lab, [address] [place], [place] [address]    Report Status 05/02/2023 FINAL  Final  Blood culture (routine x 2)     Status: None   Collection Time: 04/27/23 10:30 PM   Specimen: BLOOD RIGHT HAND  Result Value Ref Range Status   Specimen Description BLOOD RIGHT HAND  Final   Special Requests   Final    BOTTLES DRAWN AEROBIC AND ANAEROBIC Blood Culture results may not be optimal due to an inadequate volume of blood received in culture bottles   Culture   Final    NO GROWTH 5 DAYS Performed at [facility], [address] [place],  [place] [address]    Report Status 05/02/2023 FINAL  Final  Aerobic/Anaerobic Culture w Gram Stain (surgical/deep wound)     Status: None (Preliminary result)   Collection Time: 04/28/23 12:02 PM   Specimen: Abdomen; Abscess  Result Value Ref Range Status   Specimen Description ABDOMEN  Final   Special Requests ABSCESS  Final   Gram Stain   Final    FEW WBC PRESENT, PREDOMINANTLY PMN ABUNDANT GRAM NEGATIVE RODS MODERATE GRAM POSITIVE COCCI IN PAIRS IN CLUSTERS FEW  YEAST FEW GRAM POSITIVE RODS Performed at [facility], [address] [place], [place] [address]    Culture   Final    ABUNDANT STREPTOCOCCUS INFANTARIUS ABUNDANT ESCHERICHIA COLI NO ANAEROBES ISOLATED; CULTURE IN PROGRESS FOR 5 DAYS    Report Status PENDING  Incomplete   Organism ID, Bacteria ESCHERICHIA COLI  Final   Organism ID, Bacteria STREPTOCOCCUS INFANTARIUS  Final      Susceptibility   Escherichia coli - MIC*    AMPICILLIN >=32 RESISTANT Resistant     CEFEPIME  <=0.12 SENSITIVE Sensitive     CEFTAZIDIME <=1 SENSITIVE Sensitive     CEFTRIAXONE  <=0.25 SENSITIVE Sensitive     CIPROFLOXACIN  1 RESISTANT Resistant     GENTAMICIN <=1 SENSITIVE Sensitive     IMIPENEM <=0.25 SENSITIVE Sensitive     TRIMETH /SULFA  <=20 SENSITIVE Sensitive     AMPICILLIN/SULBACTAM 16 INTERMEDIATE Intermediate     PIP/TAZO <=4 SENSITIVE Sensitive ug/mL    * ABUNDANT ESCHERICHIA COLI   Streptococcus infantarius - MIC*    PENICILLIN <=0.06 SENSITIVE Sensitive     CEFTRIAXONE  <=0.12 SENSITIVE Sensitive     ERYTHROMYCIN >=8 RESISTANT Resistant     LEVOFLOXACIN 4 INTERMEDIATE Intermediate     VANCOMYCIN 0.5 SENSITIVE Sensitive     * ABUNDANT STREPTOCOCCUS INFANTARIUS      Radiology Studies: NM Myocar Multi W/Spect W/Wall Motion / EF Result Date: 05/01/2023   Small, moderate, reversible perfusion defect in the apical septum, and apex consistent with ischemia in the LAD distribution. LVEF 49%. Intermediate risk study based on perfusion and LVEF. Poor quality study noted.   LV perfusion is abnormal. There is evidence of ischemia. There is no evidence of infarction. Defect 1: There is a small defect with severe reduction in uptake present in the apical to mid anteroseptal and apex location(s) that is reversible. There is abnormal wall motion in the defect area. Consistent with ischemia.   Left ventricular function is abnormal. Global function is mildly reduced. Nuclear stress EF: 49%. The left  ventricular ejection fraction is mildly decreased (45-54%). End diastolic cavity size is mildly enlarged.   Findings are consistent with ischemia. The study is intermediate risk.       LOS: 4 days    Elgin Lam, MD [facility] 05/02/2023, 10:14 AM   If 7PM-7AM, please contact night-coverage [email or web address]

## 2023-05-02 NOTE — TOC Initial Note (Addendum)
 Transition of Care (TOC) - Initial/Assessment Note   Spoke to patient at bedside. Confirmed face sheet information. Patient from home alone. Brother lives next door. Patient states they take care of each other.   Explained nurse will provide wound and drain care education prior to discharge. Patient was doing drain and wound care prior to admission , however Eakin pouch is new. He would need eakin pouch education. Per WOC nurse Eakin pouches are not covered by insurance. Patient aware . Will await discharge wound care instructions.   Per WOC Eakin pouches $45.00 each. Surgery aware . Await discharge wound instructions.   Patient does believe he will require a HHRN.     Transition of Care Department [facility]) has reviewed patient and  will continue to monitor patient advancement through interdisciplinary progression rounds. If new patient transition needs arise, please place a TOC consult.   Patient Details  Name: Bradley Torres MRN: [id number] Date of Birth: [birth year]  Transition of Care [facility]) CM/SW Contact:    Stephane Powell Jansky, RN Phone Number: 05/02/2023, 11:55 AM  Clinical Narrative:                   Expected Discharge Plan: Home/Self Care Barriers to Discharge: Continued Medical Work up   Patient Goals and CMS Choice Patient states their goals for this hospitalization and ongoing recovery are:: to return to home CMS Medicare.gov Compare Post Acute Care list provided to:: Patient Choice offered to / list presented to : Patient      Expected Discharge Plan and Services   Discharge Planning Services: CM Consult Post Acute Care Choice: Home Health Living arrangements for the past 2 months: Single Family Home                 DME Arranged: N/A         HH Arranged: Patient Refused HH          Prior Living Arrangements/Services Living arrangements for the past 2 months: Single Family Home Lives with:: Self Patient language and need for interpreter reviewed:: Yes Do  you feel safe going back to the place where you live?: Yes      Need for Family Participation in Patient Care: Yes (Comment) Care giver support system in place?: Yes (comment) Current home services: DME Criminal Activity/Legal Involvement Pertinent to Current Situation/Hospitalization: No - Comment as needed  Activities of Daily Living      Permission Sought/Granted   Permission granted to share information with : No              Emotional Assessment Appearance:: Appears stated age Attitude/Demeanor/Rapport: Engaged Affect (typically observed): Accepting Orientation: : Oriented to Self, Oriented to Place, Oriented to  Time, Oriented to Situation Alcohol / Substance Use: Not Applicable Psych Involvement: No (comment)  Admission diagnosis:  Abdominal wall abscess [L02.211] Patient Active Problem List   Diagnosis Date Noted   Abdominal wall abscess 04/27/2023   Intra-abdominal abscess (HCC) 10/01/2022   Atrial fibrillation with RVR (HCC) 06/27/2022   Severe sepsis (HCC) 06/19/2022   Sigmoid diverticulitis 06/19/2022   QT prolongation 06/19/2022   Mixed hyperlipidemia 06/19/2022   Acute pulmonary embolism (HCC) 06/19/2022   History of DVT (deep vein thrombosis) 06/19/2022   Diverticulitis of large intestine with perforation and abscess 06/19/2022   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/23/2021   History of pulmonary embolus (PE) 10/24/2020   Paroxysmal atrial fibrillation (HCC) 12/31/2019   Vitamin D  deficiency    Type 2 diabetes mellitus with hyperglycemia, without  long-term current use of insulin  (HCC)    Sleep related hypoxia 11/01/2019   Severe obstructive sleep apnea-hypopnea syndrome 08/01/2019   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   Arteriovenous malformation 08/01/2019   Primary osteoarthritis involving multiple joints 06/21/2019   Dysphagia 10/17/2018   Anticoagulated by anticoagulation treatment 03/06/2018   Hx of non-ST elevation myocardial infarction  (NSTEMI) 03/06/2018   Chronic heart failure with preserved ejection fraction (HFpEF) (HCC)    Chronic pain syndrome    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   CKD (chronic kidney disease) stage 3, GFR 30-59 ml/min (HCC) 01/20/2018   Morbid (severe) obesity due to excess calories (HCC) 01/20/2018   Acute renal failure superimposed on stage 3a chronic kidney disease (HCC) 01/19/2018   Disorder of left rotator cuff 11/01/2017   Hx of adenomatous colonic polyps 07/26/2017   Hypertension associated with diabetes (HCC) 01/23/2014   Chronic bilateral back pain 01/23/2014   PCP:  Zollie Lowers, MD Pharmacy:   [facility] [phone] - [place], [place] - [address] AT [facility] [address] [place] [address] Phone: [phone] Fax: [phone]  [facility] - [place], [place] - [address] [place] [address] Phone: [phone] Fax: [phone]     Social Drivers of Health (SDOH) Social History: SDOH Screenings   Food Insecurity: No Food Insecurity (04/28/2023)  Housing: Low Risk  (04/28/2023)  Transportation Needs: No Transportation Needs (04/28/2023)  Utilities: Not At Risk (04/28/2023)  Depression (PHQ2-9): Low Risk  (02/21/2023)  Financial Resource Strain: Medium Risk (05/18/2020)  Social Connections: Unknown (01/16/2020)  Stress: Stress Concern Present (05/15/2019)  Tobacco Use: Low Risk  (04/27/2023)   SDOH Interventions: Housing Interventions: Other (Comment)   Readmission Risk Interventions     No data to display

## 2023-05-02 NOTE — Progress Notes (Signed)
60ml of purulent drainage emptied from pt bag.

## 2023-05-02 NOTE — Progress Notes (Signed)
 Subjective/Chief Complaint: Unchanged, small reversible defect on scan    Objective: Vital signs in last 24 hours: Temp:  [97.7 F (36.5 C)-98.6 F (37 C)] 97.7 F (36.5 C) (02/04 0557) Pulse Rate:  [66-72] 66 (02/04 0557) Resp:  [17] 17 (02/04 0557) BP: (110-139)/(48-76) 131/76 (02/04 0557) SpO2:  [93 %-99 %] 96 % (02/04 0557) Last BM Date : 04/29/23  Intake/Output from previous day: 02/03 0701 - 02/04 0700 In: -  Out: 85 [Drains:85] Intake/Output this shift: No intake/output data recorded.  Ab soft fistula present, drain foul smelling  Lab Results:  Recent Labs    04/30/23 1009 05/01/23 0521  WBC 8.8 9.2  HGB 10.5* 10.5*  HCT 31.3* 32.0*  PLT 276 305   BMET Recent Labs    04/30/23 0921 05/01/23 0521  NA 134* 135  K 3.5 3.5  CL 100 99  CO2 24 26  GLUCOSE 129* 114*  BUN 21 17  CREATININE 1.63* 1.54*  CALCIUM  8.2* 8.3*   PT/INR No results for input(s): LABPROT, INR in the last 72 hours. ABG No results for input(s): PHART, HCO3 in the last 72 hours.  Invalid input(s): PCO2, PO2  Studies/Results: NM Myocar Multi W/Spect W/Wall Motion / EF Result Date: 05/01/2023   Small, moderate, reversible perfusion defect in the apical septum, and apex consistent with ischemia in the LAD distribution. LVEF 49%. Intermediate risk study based on perfusion and LVEF. Poor quality study noted.   LV perfusion is abnormal. There is evidence of ischemia. There is no evidence of infarction. Defect 1: There is a small defect with severe reduction in uptake present in the apical to mid anteroseptal and apex location(s) that is reversible. There is abnormal wall motion in the defect area. Consistent with ischemia.   Left ventricular function is abnormal. Global function is mildly reduced. Nuclear stress EF: 49%. The left ventricular ejection fraction is mildly decreased (45-54%). End diastolic cavity size is mildly enlarged.   Findings are consistent with ischemia. The  study is intermediate risk.    Anti-infectives: Anti-infectives (From admission, onward)    Start     Dose/Rate Route Frequency Ordered Stop   04/28/23 0045  piperacillin -tazobactam (ZOSYN ) IVPB 3.375 g        3.375 g 12.5 mL/hr over 240 Minutes Intravenous Every 8 hours 04/28/23 0036     04/27/23 1845  piperacillin -tazobactam (ZOSYN ) IVPB 3.375 g        3.375 g 100 mL/hr over 30 Minutes Intravenous  Once 04/27/23 1842 04/27/23 2006   04/27/23 1845  metroNIDAZOLE  (FLAGYL ) IVPB 500 mg        500 mg 100 mL/hr over 60 Minutes Intravenous  Once 04/27/23 1842 04/27/23 2117       Assessment/Plan: 63 yo male with 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 This has been going on for 7 months now. He has lost over 100 pounds in the last year. He still has significant comorbidities of a fib, DM II, CHF, h/o stroke, OSA, h/o PE 2019   - follow up with cards for plan - Abx per ID - Drain per IR - placed 1/31 - WOCN for possible pouching and assistance with fistula peri-wound care - Albumin  2.4. Pre-alb 11.  - surgery certainly only way to fix this. Has been going on for 9 months almost.  So this is not urgent. His ntn needs to be better and have cards plan -may go home on abx, and be seen in follow up   FEN -  HHD and ensure VTE - 80 mg lovenox  daily ID - zosyn  1/31--> Admit - inpatient  I reviewed Consultant cardiology notes, hospitalist notes, last 24 h vitals and pain scores, last 48 h intake and output, last 24 h labs and trends, and last 24 h imaging results.    Bradley Torres 05/02/2023

## 2023-05-02 NOTE — Progress Notes (Addendum)
 ID brief note  Afebrile  NM study consistent with ischemia  Results for orders placed or performed during the hospital encounter of 04/27/23  Blood culture (routine x 2)     Status: None   Collection Time: 04/27/23  5:10 PM   Specimen: BLOOD  Result Value Ref Range Status   Specimen Description BLOOD SITE NOT SPECIFIED  Final   Special Requests   Final    BOTTLES DRAWN AEROBIC AND ANAEROBIC Blood Culture results may not be optimal due to an inadequate volume of blood received in culture bottles   Culture   Final    NO GROWTH 5 DAYS Performed at [facility], [address] [place], [place] [address]    Report Status 05/02/2023 FINAL  Final  Blood culture (routine x 2)     Status: None   Collection Time: 04/27/23 10:30 PM   Specimen: BLOOD RIGHT HAND  Result Value Ref Range Status   Specimen Description BLOOD RIGHT HAND  Final   Special Requests   Final    BOTTLES DRAWN AEROBIC AND ANAEROBIC Blood Culture results may not be optimal due to an inadequate volume of blood received in culture bottles   Culture   Final    NO GROWTH 5 DAYS Performed at [facility], [address], [place], [place] [address]    Report Status 05/02/2023 FINAL  Final  Aerobic/Anaerobic Culture w Gram Stain (surgical/deep wound)     Status: None (Preliminary result)   Collection Time: 04/28/23 12:02 PM   Specimen: Abdomen; Abscess  Result Value Ref Range Status   Specimen Description ABDOMEN  Final   Special Requests ABSCESS  Final   Gram Stain   Final    FEW WBC PRESENT, PREDOMINANTLY PMN ABUNDANT GRAM NEGATIVE RODS MODERATE GRAM POSITIVE COCCI IN PAIRS IN CLUSTERS FEW YEAST FEW GRAM POSITIVE RODS Performed at [facility], [address] [place], [place] [address]    Culture   Final    ABUNDANT STREPTOCOCCUS INFANTARIUS ABUNDANT ESCHERICHIA COLI NO ANAEROBES ISOLATED; CULTURE IN PROGRESS FOR 5 DAYS    Report Status PENDING  Incomplete   Organism ID, Bacteria ESCHERICHIA  COLI  Final   Organism ID, Bacteria STREPTOCOCCUS INFANTARIUS  Final      Susceptibility   Escherichia coli - MIC*    AMPICILLIN >=32 RESISTANT Resistant     CEFEPIME  <=0.12 SENSITIVE Sensitive     CEFTAZIDIME <=1 SENSITIVE Sensitive     CEFTRIAXONE  <=0.25 SENSITIVE Sensitive     CIPROFLOXACIN  1 RESISTANT Resistant     GENTAMICIN <=1 SENSITIVE Sensitive     IMIPENEM <=0.25 SENSITIVE Sensitive     TRIMETH /SULFA  <=20 SENSITIVE Sensitive     AMPICILLIN/SULBACTAM 16 INTERMEDIATE Intermediate     PIP/TAZO <=4 SENSITIVE Sensitive ug/mL    * ABUNDANT ESCHERICHIA COLI   Streptococcus infantarius - MIC*    PENICILLIN <=0.06 SENSITIVE Sensitive     CEFTRIAXONE  <=0.12 SENSITIVE Sensitive     ERYTHROMYCIN >=8 RESISTANT Resistant     LEVOFLOXACIN 4 INTERMEDIATE Intermediate     VANCOMYCIN 0.5 SENSITIVE Sensitive     * ABUNDANT STREPTOCOCCUS INFANTARIUS   Will switch zosyn  to ceftriaxone  and metronidazole .  Fu surgical plans   Annalee Orem, MD Infectious Disease Physician [facility] [address] [place], [place] [address] Phone: [phone]  Fax: [phone]

## 2023-05-02 NOTE — Progress Notes (Signed)
 Patient Name: Bradley Torres Date of Encounter: 05/02/2023 [facility] Cardiologist: Alvan Carrier, MD   Interval Summary  .    No chest pain or shortness of breath.  Vital Signs .    Vitals:   05/01/23 1402 05/01/23 1607 05/01/23 2128 05/02/23 0557  BP: (!) 111/49 130/67 139/71 131/76  Pulse:  69 72 66  Resp:  17 17 17   Temp:  97.7 F (36.5 C) 98.6 F (37 C) 97.7 F (36.5 C)  TempSrc:  Oral Oral Oral  SpO2:  99% 98% 96%  Weight:      Height:        Intake/Output Summary (Last 24 hours) at 05/02/2023 0754 Last data filed at 05/01/2023 1619 Gross per 24 hour  Intake --  Output 85 ml  Net -85 ml      04/27/2023    5:02 PM 02/21/2023    3:42 PM 02/07/2023    3:14 PM  Last 3 Weights  Weight (lbs) 350 lb 347 lb 9.6 oz 337 lb  Weight (kg) 158.759 kg 157.67 kg 152.862 kg      Telemetry/ECG    Sinus rhythm heart rates in the 60s- Personally Reviewed  CV Studies    Echocardiogram 06/21/2022 1. Left ventricular ejection fraction, by estimation, is 50 to 55% -  difficuly evaluation in the setting of rapid atrial fibrillation and  variable contraction from beat to beat. There is intermittent septal  bounce in association with respiration. The  left ventricle has low normal function. The left ventricle demonstrates  global hypokinesis. Left ventricular diastolic parameters are  indeterminate.   2. Right ventricular systolic function is moderately reduced. The right  ventricular size is mildly enlarged. There is mildly elevated pulmonary  artery systolic pressure. The estimated right ventricular systolic  pressure is 42.2 mmHg.   3. Left atrial size was mildly dilated.   4. The mitral valve is grossly normal. Trivial mitral valve  regurgitation.   5. The aortic valve is tricuspid. Aortic valve regurgitation is not  visualized.   6. The inferior vena cava is dilated in size with <50% respiratory  variability, suggesting right atrial pressure of 15 mmHg.    Comparison(s): Prior images reviewed side by side. Difficult to compare  since patient currently in rapid atrial fibrillation. Consider follow-up  limited study once back in sinus rhythm.   Left heart catheterization 01/23/2018 Prox LAD lesion is 10% stenosed. The left ventricular ejection fraction is 50-55% by visual estimate. LV end diastolic pressure is mildly elevated. The left ventricular systolic function is normal.   No significant coronary obstructive disease with essentially normal coronary arteries and only mild luminal irregularity of the proximal LAD of 10%; normal left circumflex and normal dominant RCA.   Low normal global LV function with an ejection fraction of 50 to 55% without definitive focal segmental wall motion abnormalities.  LVEDP is 18 mmHg.   RECOMMENDATION: Medical therapy.  Weight loss is essential.  The patient should be evaluated for obstructive sleep apnea.   No indication for antiplatelet therapy at this time.  Physical Exam .   GEN: No acute distress.   Neck: No JVD Cardiac: RRR, no murmurs, rubs, or gallops.  Respiratory: Clear to auscultation bilaterally. GI: Soft, nontender, non-distended  MS: No edema  Patient Profile    Bradley Torres is a 63 y.o. male has hx of mild nonobstructive CAD, PAF, pulmonary hypertension, OSA not on CPAP, DVT, PE, CVA.  Cardiology seen for preoperative evaluation.  Assessment & Plan .     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  Does not demonstrate any concerning symptoms for CAD progression, though METS difficult to assess for him.  He does have moderately reduced RV function, mildly elevated RVSP, 42 and refuses to wear his CPAP which also poses greater risk for anesthesia.  RCRI equals 2 indicating 10.1% risk of MACE.    His Lexiscan  showed a small, moderate  reversible perfusion defect consistent with ischemia in the LAD distribution, similar from previous minimal CAD from cardiac catheterization in 2019.  He is asymptomatic but anginal equivalents difficult to assess given sedentary lifestyle.  Poor image quality also noted though.  Will need to review films with MD and discussed with GI about plans.  PAF Maintaining NSR here.  On therapeutic Lovenox  transition back to DOAC per surgery.  Continue amiodarone  200 mg daily  Prolonged QTc Resolved 462 yesterday.   For questions or updates, please contact [facility] Please consult [email or web address] for contact info under        Signed, Thom LITTIE Sluder, PA-C

## 2023-05-03 DIAGNOSIS — K632 Fistula of intestine: Secondary | ICD-10-CM | POA: Diagnosis not present

## 2023-05-03 DIAGNOSIS — K651 Peritoneal abscess: Secondary | ICD-10-CM | POA: Diagnosis not present

## 2023-05-03 DIAGNOSIS — K5720 Diverticulitis of large intestine with perforation and abscess without bleeding: Secondary | ICD-10-CM | POA: Diagnosis not present

## 2023-05-03 DIAGNOSIS — I480 Paroxysmal atrial fibrillation: Secondary | ICD-10-CM | POA: Diagnosis not present

## 2023-05-03 DIAGNOSIS — I152 Hypertension secondary to endocrine disorders: Secondary | ICD-10-CM | POA: Diagnosis not present

## 2023-05-03 DIAGNOSIS — I5032 Chronic diastolic (congestive) heart failure: Secondary | ICD-10-CM | POA: Diagnosis not present

## 2023-05-03 DIAGNOSIS — E1159 Type 2 diabetes mellitus with other circulatory complications: Secondary | ICD-10-CM | POA: Diagnosis not present

## 2023-05-03 DIAGNOSIS — R9439 Abnormal result of other cardiovascular function study: Secondary | ICD-10-CM | POA: Diagnosis not present

## 2023-05-03 DIAGNOSIS — K5732 Diverticulitis of large intestine without perforation or abscess without bleeding: Secondary | ICD-10-CM | POA: Diagnosis not present

## 2023-05-03 DIAGNOSIS — L02211 Cutaneous abscess of abdominal wall: Secondary | ICD-10-CM | POA: Diagnosis not present

## 2023-05-03 DIAGNOSIS — R9431 Abnormal electrocardiogram [ECG] [EKG]: Secondary | ICD-10-CM | POA: Diagnosis not present

## 2023-05-03 LAB — BASIC METABOLIC PANEL
Anion gap: 11 (ref 5–15)
BUN: 18 mg/dL (ref 8–23)
CO2: 27 mmol/L (ref 22–32)
Calcium: 8.7 mg/dL — ABNORMAL LOW (ref 8.9–10.3)
Chloride: 96 mmol/L — ABNORMAL LOW (ref 98–111)
Creatinine, Ser: 1.46 mg/dL — ABNORMAL HIGH (ref 0.61–1.24)
GFR, Estimated: 54 mL/min — ABNORMAL LOW (ref >60)
Glucose, Bld: 142 mg/dL — ABNORMAL HIGH (ref 70–99)
Potassium: 3.4 mmol/L — ABNORMAL LOW (ref 3.5–5.1)
Sodium: 134 mmol/L — ABNORMAL LOW (ref 135–145)

## 2023-05-03 LAB — CBC
HCT: 31.6 % — ABNORMAL LOW (ref 39.0–52.0)
Hemoglobin: 10.5 g/dL — ABNORMAL LOW (ref 13.0–17.0)
MCH: 30.7 pg (ref 26.0–34.0)
MCHC: 33.2 g/dL (ref 30.0–36.0)
MCV: 92.4 fL (ref 80.0–100.0)
Platelets: 364 10*3/uL (ref 150–400)
RBC: 3.42 MIL/uL — ABNORMAL LOW (ref 4.22–5.81)
RDW: 14 % (ref 11.5–15.5)
WBC: 9.4 10*3/uL (ref 4.0–10.5)
nRBC: 0 % (ref 0.0–0.2)

## 2023-05-03 LAB — GLUCOSE, CAPILLARY
Glucose-Capillary: 139 mg/dL — ABNORMAL HIGH (ref 70–99)
Glucose-Capillary: 230 mg/dL — ABNORMAL HIGH (ref 70–99)
Glucose-Capillary: 117 mg/dL — ABNORMAL HIGH (ref 70–99)
Glucose-Capillary: 144 mg/dL — ABNORMAL HIGH (ref 70–99)

## 2023-05-03 MED ORDER — RIVAROXABAN 20 MG PO TABS
20.0000 mg | ORAL_TABLET | Freq: Every day | ORAL | Status: DC
  Administered 2023-05-03 – 2023-05-04 (×2): 20 mg via ORAL
  Filled 2023-05-03 (×2): qty 1

## 2023-05-03 MED ORDER — GLUCERNA SHAKE PO LIQD
237.0000 mL | Freq: Two times a day (BID) | ORAL | Status: DC
  Administered 2023-05-04 – 2023-05-05 (×4): 237 mL via ORAL

## 2023-05-03 MED ORDER — POTASSIUM CHLORIDE CRYS ER 20 MEQ PO TBCR
40.0000 meq | EXTENDED_RELEASE_TABLET | Freq: Once | ORAL | Status: AC
  Administered 2023-05-03: 40 meq via ORAL
  Filled 2023-05-03: qty 2

## 2023-05-03 MED ORDER — GLUCERNA SHAKE PO LIQD: 237.0000 mL | Freq: Three times a day (TID) | ORAL | Status: DC

## 2023-05-03 NOTE — Consult Note (Addendum)
 WOC Nurse wound follow up Current Eakin pouch is leaking.   No supplies in the room; ordered small Eakin pouches Soila # [phone]) but the store room did not have any available and sent medium Eakin pouches instead.  Pt will be able to use small Eakin pouches, 4.3X3cm, when ordering from [facility]  Removed pouch and performed a teaching session with patient on pouch application steps, using a hand held mirror.  Pt watched the procedure and asked appropriate questions. Discussed that the best way to apply the pouch at home is standing and looking in the mirror.  He was able to cleanse skin, shape and apply the barrier ring, and apply pouch over the opening with assistance.  Fistula site to abd is approx 1.5cm and round, located in a crease, and is in close proximity to the drain; it is difficult to keep the tube separate from the pouching system. Pt plans to have home health assistance after discharge, according to progress notes.  Discussed with patient that Nellie pouches are not covered by insurance, and he can order them on [employer].  He states he is on a fixed income and he may not be able to continue to order them after discharge. 4 sets of barrier rings and medium Eakin pouches left in the room.  Currently, there is a mod amt liquid brown stool in the pouch and the spout will be able to be used to empty.  Pt is able to open and close to empty.  Encouraged him to practice in the bathroom prior to discharge. He is also aware if stool becomes thicker, he should cut off the spout and apply an ostomy clamp.  He is able to open and close the clamp and prefers to use it in addition to the spout at this time, because then he is sure it won't pop open.  Dressing procedure/placement/frequency: Topical treatment orders provided for patient and bedside nurses to perform: 1. Cleanse skin around fistula and perc drain with soap and water . 2. Apply skin prep 3. Apply barrier ring Soila # [phone]). 4. Cut medium Eakin  pouch (LAWSON # [id number]) off center to accommodate perc drain. Pattern left at bedside. 5. If stool is too thick for spout, then cut off spout and apply pouch clip. Soila # [phone])  WOC team will check on the patient again on Fri.  Thank-you,  Stephane Fought MSN, RN, CWOCN, [place], CNS [phone]

## 2023-05-03 NOTE — Progress Notes (Signed)
 [facility] Infectious Diseases Follow Up Note  Patient Identification: Patient Name: Bradley Torres MRN: [id number] Admit Date: 04/27/2023  4:55 PM Age: 63 y.o.Today's Date: 05/03/2023  Reason for Visit: Abdominal wall abscess  Principal Problem:   Abdominal wall abscess Active Problems:   Hypertension associated with diabetes (HCC)   Acute renal failure superimposed on stage 3a chronic kidney disease (HCC)   Chronic heart failure with preserved ejection fraction (HFpEF) (HCC)   Paroxysmal atrial fibrillation (HCC)   QT prolongation   Antibiotics: Ceftriaxone  2/4- Metronidazole  2/4- Total days of antibiotics 7  Lines/Hardwares: LUQ JP drain  Interval Events: Remains afebrile, labs with K3.4 creatinine 1.46.  No leukocytosis   Assessment 63 year old male with PMH of pA-fib on AC, DVT/PE in 2019, DM2, HTN, CKD, HLD, pulmonary hypertension, CHFpEF, gout, CAD, CVA, morbid obesity/OSA on CPAP, chronic back pain, history of perforated sigmoid diverticulitis with abscess and JP drain placement in October 01 2022 ( cx with multiple organisms with mixed anaerobic flora, discharged in 14 days augmentin ) with persist with drain removal in February 17, 2023 who presented to the ED on 1/30 with redness and purulent versus feculent drainage from left lower quadrant at previous drain removal site.  Admitted with   # Abdominal wall abscess (no clear evidence of connection to intra-abdominal contents/subjacent per CT) 1/31 Status post CT-guided drainage of abdominal abscess adjacent to the left colon. Cx strep infantarius and E coli   # Chronic draining colocutaneous fistula -General Surgery following and possible plan for elective surgery once he is medically optimized    # AKI on CKD-setting of sepsis, improved with IVF # DM2  Recommendations - continue ceftriaxone  and metronidazole  IP. Have requested sensi of E coli to cefazolin to guide PO  abtx regimen for discharge.  - Monitor CBC and CMP  - Fu with IR and surgery as instructed  - Fu with ID will be made on discharge   Evaluation of this patient requires complex antimicrobial therapy evaluation and counseling + isolation needs for disease transmission risk assessment and mitigation.   Rest of the management as per the primary team. Thank you for the consult. Please page with pertinent questions or concerns.  ______________________________________________________________________ Subjective patient seen and examined at the bedside. No complaints, discussed abtx plan for discharge and fu. Denies nausea, vomiting or diarrhea   Vitals BP 118/77 (BP Location: Right Arm)   Pulse (!) 59   Temp 97.8 F (36.6 C) (Oral)   Resp 19   Ht 6' (1.829 m)   Wt (!) 158.8 kg   SpO2 95%   BMI 47.47 kg/m      Physical Exam Constitutional: Morbidly obese adult male lying in the bed    Comments:   Cardiovascular:     Rate and Rhythm: Normal rate and regular rhythm.     Heart sounds:   Pulmonary:     Effort: Pulmonary effort is normal.     Comments:   Abdominal:     Palpations: Abdomen is soft.     Tenderness: Nondistended and nontender, LUQ drain with dirty appearing fluid in the bulb  Musculoskeletal:        General: No swelling or tenderness.   Skin:    Comments: No rashes  Neurological:     General: Awake, alert and oriented, following commands  Psychiatric:        Mood and Affect: Mood normal.   Pertinent Microbiology Results for orders placed or performed during the hospital encounter of 04/27/23  Blood culture (routine x 2)     Status: None   Collection Time: 04/27/23  5:10 PM   Specimen: BLOOD  Result Value Ref Range Status   Specimen Description BLOOD SITE NOT SPECIFIED  Final   Special Requests   Final    BOTTLES DRAWN AEROBIC AND ANAEROBIC Blood Culture results may not be optimal due to an inadequate volume of blood received in culture bottles    Culture   Final    NO GROWTH 5 DAYS Performed at [facility], [address] [place], [place] [address]    Report Status 05/02/2023 FINAL  Final  Blood culture (routine x 2)     Status: None   Collection Time: 04/27/23 10:30 PM   Specimen: BLOOD RIGHT HAND  Result Value Ref Range Status   Specimen Description BLOOD RIGHT HAND  Final   Special Requests   Final    BOTTLES DRAWN AEROBIC AND ANAEROBIC Blood Culture results may not be optimal due to an inadequate volume of blood received in culture bottles   Culture   Final    NO GROWTH 5 DAYS Performed at [facility], [address] [place], [place] [address]    Report Status 05/02/2023 FINAL  Final  Aerobic/Anaerobic Culture w Gram Stain (surgical/deep wound)     Status: None (Preliminary result)   Collection Time: 04/28/23 12:02 PM   Specimen: Abdomen; Abscess  Result Value Ref Range Status   Specimen Description ABDOMEN  Final   Special Requests ABSCESS  Final   Gram Stain   Final    FEW WBC PRESENT, PREDOMINANTLY PMN ABUNDANT GRAM NEGATIVE RODS MODERATE GRAM POSITIVE COCCI IN PAIRS IN CLUSTERS FEW YEAST FEW GRAM POSITIVE RODS Performed at [facility], [address], [place], [place] [address]    Culture   Final    ABUNDANT STREPTOCOCCUS INFANTARIUS ABUNDANT ESCHERICHIA COLI NO ANAEROBES ISOLATED; CULTURE IN PROGRESS FOR 5 DAYS    Report Status PENDING  Incomplete   Organism ID, Bacteria ESCHERICHIA COLI  Final   Organism ID, Bacteria STREPTOCOCCUS INFANTARIUS  Final      Susceptibility   Escherichia coli - MIC*    AMPICILLIN >=32 RESISTANT Resistant     CEFEPIME  <=0.12 SENSITIVE Sensitive     CEFTAZIDIME <=1 SENSITIVE Sensitive     CEFTRIAXONE  <=0.25 SENSITIVE Sensitive     CIPROFLOXACIN  1 RESISTANT Resistant     GENTAMICIN <=1 SENSITIVE Sensitive     IMIPENEM <=0.25 SENSITIVE Sensitive     TRIMETH /SULFA  <=20 SENSITIVE Sensitive     AMPICILLIN/SULBACTAM 16 INTERMEDIATE Intermediate      PIP/TAZO <=4 SENSITIVE Sensitive ug/mL    * ABUNDANT ESCHERICHIA COLI   Streptococcus infantarius - MIC*    PENICILLIN <=0.06 SENSITIVE Sensitive     CEFTRIAXONE  <=0.12 SENSITIVE Sensitive     ERYTHROMYCIN >=8 RESISTANT Resistant     LEVOFLOXACIN 4 INTERMEDIATE Intermediate     VANCOMYCIN 0.5 SENSITIVE Sensitive     * ABUNDANT STREPTOCOCCUS INFANTARIUS   Pertinent Lab.    Latest Ref Rng & Units 05/03/2023    6:41 AM 05/01/2023    5:21 AM 04/30/2023   10:09 AM  CBC  WBC 4.0 - 10.5 K/uL 9.4  9.2  8.8   Hemoglobin 13.0 - 17.0 g/dL 89.4  89.4  89.4   Hematocrit 39.0 - 52.0 % 31.6  32.0  31.3   Platelets 150 - 400 K/uL 364  305  276       Latest Ref  Rng & Units 05/03/2023    6:41 AM 05/01/2023    5:21 AM 04/30/2023    9:21 AM  CMP  Glucose 70 - 99 mg/dL 857  885  870   BUN 8 - 23 mg/dL 18  17  21    Creatinine 0.61 - 1.24 mg/dL 8.53  8.45  8.36   Sodium 135 - 145 mmol/L 134  135  134   Potassium 3.5 - 5.1 mmol/L 3.4  3.5  3.5   Chloride 98 - 111 mmol/L 96  99  100   CO2 22 - 32 mmol/L 27  26  24    Calcium  8.9 - 10.3 mg/dL 8.7  8.3  8.2   Total Protein 6.5 - 8.1 g/dL  6.4    Total Bilirubin 0.0 - 1.2 mg/dL  0.5    Alkaline Phos 38 - 126 U/L  67    AST 15 - 41 U/L  13    ALT 0 - 44 U/L  13      Pertinent Imaging today Plain films and CT images have been personally visualized and interpreted; radiology reports have been reviewed. Decision making incorporated into the Impression /   NM Myocar Multi W/Spect W/Wall Motion / EF Result Date: 05/01/2023   Small, moderate, reversible perfusion defect in the apical septum, and apex consistent with ischemia in the LAD distribution. LVEF 49%. Intermediate risk study based on perfusion and LVEF. Poor quality study noted.   LV perfusion is abnormal. There is evidence of ischemia. There is no evidence of infarction. Defect 1: There is a small defect with severe reduction in uptake present in the apical to mid anteroseptal and apex location(s) that is  reversible. There is abnormal wall motion in the defect area. Consistent with ischemia.   Left ventricular function is abnormal. Global function is mildly reduced. Nuclear stress EF: 49%. The left ventricular ejection fraction is mildly decreased (45-54%). End diastolic cavity size is mildly enlarged.   Findings are consistent with ischemia. The study is intermediate risk.   CT GUIDED PERITONEAL/RETROPERITONEAL FLUID DRAIN BY PERC CATH Result Date: 04/28/2023 INDICATION: 63 year old male with a history of diverticular abscess, prior drainage. He presents emergency department with drainage through a skin tract. CT demonstrates fluid collection adjacent to the colon concerning for recurrent abscess. In addition he was small fluid collection in the anterior abdominal wall. EXAM: CT-GUIDED DRAINAGE OF INTRA-ABDOMINAL ABSCESS TECHNIQUE: Multidetector CT imaging of the abdomen was performed following the standard protocol without IV contrast. RADIATION DOSE REDUCTION: This exam was performed according to the departmental dose-optimization program which includes automated exposure control, adjustment of the mA and/or kV according to patient size and/or use of iterative reconstruction technique. MEDICATIONS: The patient is currently admitted to the hospital and receiving intravenous antibiotics. The antibiotics were administered within an appropriate time frame prior to the initiation of the procedure. ANESTHESIA/SEDATION: Moderate (conscious) sedation was employed during this procedure. A total of Versed  1.5 mg and Fentanyl  75 mcg was administered intravenously by the radiology nurse. Total intra-service moderate Sedation Time: 24 minutes. The patient's level of consciousness and vital signs were monitored continuously by radiology nursing throughout the procedure under my direct supervision. COMPLICATIONS: None PROCEDURE: Informed written consent was obtained from the patient after a thorough discussion of the  procedural risks, benefits and alternatives. All questions were addressed. Maximal Sterile Barrier Technique was utilized including caps, mask, sterile gowns, sterile gloves, sterile drape, hand hygiene and skin antiseptic. A timeout was performed prior to the initiation of  the procedure. Patient was positioned supine on the CT gantry table. Scout CT acquired for planning purposes. The patient is prepped and draped in the usual sterile fashion. One sent lidocaine  was used for local anesthesia. Using CT guidance, a trocar needle was advanced from an anterior approach through the fluid collection of the anterior abdominal wall in into the pericolonic abscess. At the same time, an oblique approach with a Yueh needle was used to approach the anterior abdominal wall fluid collection. During the course of the study, the majority of the fluid in the anterior abdominal wall abscess decompressed itself through the lesion in the skin. By the completion, there was no significant fluid 4 attempted drainage placement or aspiration in the anterior abdominal wall. Once the trocar needle was in position within the abdominal abscess, modified Seldinger technique was used to place a 10 French drain. A proximally 30 cc of frankly purulent material aspirated. Final CT was acquired demonstrating decreased fluid in the anterior abdominal wall, decreased fluid in the abscess in the abdomen. The drain was sutured in position attached to bulb suction. Patient tolerated the procedure well and remained hemodynamically stable throughout. No complications were encountered and no significant blood loss. IMPRESSION: Status post CT-guided drainage of abdominal abscess adjacent to the left colon. During the course of the exam the majority of fluid in the anterior abdominal wall collection decompressed through the skin. No drain was attempted at this location. Signed, Ami RAMAN. Alona ROSALEA GRAVER, RPVI [facility]  [facility] Electronically Signed   By: Ami Alona D.O.   On: 04/28/2023 15:57   CT ABDOMEN PELVIS W CONTRAST Result Date: 04/27/2023 CLINICAL DATA:  Diverticulitis, complication suspected known fisula and abscess Patient reports increased drainage from left lower quadrant operative site. Drain removed 3 months ago. EXAM: CT ABDOMEN AND PELVIS WITH CONTRAST TECHNIQUE: Multidetector CT imaging of the abdomen and pelvis was performed using the standard protocol following bolus administration of intravenous contrast. RADIATION DOSE REDUCTION: This exam was performed according to the departmental dose-optimization program which includes automated exposure control, adjustment of the mA and/or kV according to patient size and/or use of iterative reconstruction technique. CONTRAST:  75mL OMNIPAQUE  IOHEXOL  350 MG/ML SOLN COMPARISON:  CT 10/26/2022 FINDINGS: Lower chest: No basilar airspace disease or pleural effusion. Hepatobiliary: Scattered hepatic hypodensities are too small to characterize but unchanged from prior exam, likely cysts. Moderate gallbladder distension. Small calcified gallstones. No pericholecystic inflammation. No biliary dilatation. Pancreas: No ductal dilatation or inflammation. Spleen: Normal in size without focal abnormality.  Small splenule. Adrenals/Urinary Tract: Normal adrenal glands. No hydronephrosis or perinephric edema. Homogeneous renal enhancement. Lobulated renal contours. Unchanged right renal cyst. No further follow-up imaging is recommended. Urinary bladder is physiologically distended without wall thickening. Stomach/Bowel: Unremarkable appearance of the stomach. No small bowel obstruction or inflammation. The appendix is normal. Colonic diverticulosis without acute diverticulitis. Vascular/Lymphatic: Aortic atherosclerosis. No aneurysm. The portal vein is patent. Prominent mesenteric nodes measuring up to 10 mm. This is nonspecific. Reproductive: Prostate is  unremarkable. Other: At site of prior drainage catheter there is a left lower quadrant tract extending from the skin surface to the abdominal wall. In the deep subcutaneous tissues is a area of soft tissue density with internal air measuring 4.3 x 3.6 cm. Only minimal internal fluid. Surrounding fat stranding and inflammation. This does not definitively connect to the intra-abdominal contents or subjacent bowel. No free air or ascites. Musculoskeletal: L4-L5 fusion hardware. There are no acute or suspicious osseous  abnormalities. IMPRESSION: 1. Heterogeneous collection in the left abdominal wall at site of prior drainage catheter. Tract extends from the skin surface to the abdominal wall. In the deep subcutaneous tissues is a area of soft tissue density with internal air measuring 4.3 x 3.6 cm. Only minimal internal fluid. Surrounding fat stranding and inflammation. This does not definitively connect to the intra-abdominal contents or subjacent bowel. 2. Colonic diverticulosis without acute diverticulitis. 3. Cholelithiasis. Electronically Signed   By: Andrea Gasman M.D.   On: 04/27/2023 20:48   I have personally spent 50 minutes involved in face-to-face and non-face-to-face activities for this patient on the day of the visit. Professional time spent includes the following activities: Preparing to see the patient (review of tests), Obtaining and/or reviewing separately obtained history (admission/discharge record), Performing a medically appropriate examination and/or evaluation , Ordering medications/tests/procedures, referring and communicating with other health care professionals, Documenting clinical information in the EMR, Independently interpreting results (not separately reported), Communicating results to the patient/family/caregiver, Counseling and educating the patient/family/caregiver and Care coordination (not separately reported).   Plan d/w requesting provider as well as ID pharm D  Of note,  portions of this note may have been created with voice recognition software. While this note has been edited for accuracy, occasional wrong-word or 'sound-a-like' substitutions may have occurred due to the inherent limitations of voice recognition software.   Electronically signed by:   Annalee Orem, MD Infectious Disease Physician [facility] Pager: [phone]

## 2023-05-03 NOTE — Progress Notes (Signed)
 PROGRESS NOTE    Bradley Torres  FMW:[id number] DOB: 1960/04/22 DOA: 04/27/2023 PCP: Zollie Lowers, MD   Brief Narrative: Bradley Torres is a 63 y.o. male with a history of CAD, paroxysmal atrial fibrillation, diabetes mellitus type 2, gout, hyperlipidemia, hypertension, obesity class III, OSA, OHS, pulm hypertension, chronic heart failure, chronic back pain, CKD stage IIIa.  Patient presented secondary to an abdominal wall wound with feculent/purulent drainage and was found to have evidence of an abdominal wall abscess.  Empiric antibiotics started, blood cultures obtained, interventional radiology consulted and placed a percutaneous drain for a recurrent LLQ abscess. ID consulted. General surgery consulted with plan for surgical management. Cardiology consulted for perioperative risk assessment with stress test significant for ischemia; medical management recommended.   Assessment/Plan:  Intraabdominal abscess CT abdomen/pelvis this admission was significant for an abscess measuring 4.3 x 3.6 cm without clear evidence of a connection to the intra-abdominal contents/subadjacent bowel.  Empiric Zosyn  started.  Interventional radiology was consulted and placed drain; superficial abscess was noted to have decompressed itself during IR's procedure. Wound cultures significant for Streptococcus infantarius and E. Coli. Blood cultures with no growth. -ID recommendations: Switched Zosyn  IV to Ceftriaxone  and Flagyl   Chronic colocutaneous fistula Although no fistula tract seen on imaging, clinically correlates. General surgery consulted and plan for surgical intervention. ID consulted. Cardiology consulted for perioperative risk assessment/reduction. Stress test performed for perioperative risk assessment which suggests ischemia. Cardiology plan for continued medical management. No cath recommended. Can hold Eliquis for two days prior to surgery and restart after surgery. Cardiology signed off. -ID  recommendations: As above -General surgery recommendations: plan for surgical management -Continue wound care  AKI on CKD stage IIIa Patient's baseline creatinine is about 1.4.  Patient with a BUN/creatinine of 33/2.2 respectively on admission.  CT on admission without evidence of hydronephrosis.  Creatinine peak of 2.40. Improved with IV fluids. Creatinine down to 1.54, near baseline.  Paroxysmal atrial fibrillation Currently in sinus rhythm.  Patient is on Xarelto , amiodarone  as an outpatient.  Xarelto  held secondary to anticipated procedure.  Amiodarone  held on admission secondary to prolonged QTc.  QTc slightly decreased from admission this morning. Cardiology cleared to restart amiodarone  -Continue home amiodarone  -Hold Eliquis pending general surgery recommendations for when patient is post-op  QTc prolongation Initial QTc measured to be 550 ms, subsequently down to 538 ms. -Continue telemetry  Chronic heart failure with mildly reduced ejection fraction Stable.  Last LVEF of 50 to 55% with associated moderately reduced RV systolic function.  Patient appears euvolemic at this time.  History of CVA Noted.  Patient on Xarelto  as an outpatient which was held secondary to anticipated procedure.  Patient is also on Lipitor . -Continue Lipitor   Gout -Continue allopurinol   Hyperlipidemia -Continue Lipitor   CAD Noted.  Primary hypertension Patient is on hydralazine , chlorthalidone , valsartan  as an outpatient.  Antihypertensives were held on admission.  Patient is currently normotensive with hypotension initially on presentation. -Continue to hold antihypertensives  Diabetes mellitus, type II Controlled with hemoglobin A1c of 6.5%.  Patient is managed on Januvia , metformin , Farxiga  as an outpatient.  SSI started on admission. -Continue SSI  History of DVT/PE Remote history from 2019.  Patient is on Xarelto  as an outpatient which is held on admission secondary to anticipated  procedure. -Resume Xarelto  once cleared by general surgery  Obesity, class III Estimated body mass index is 47.47 kg/m as calculated from the following:   Height as of this encounter: 6' (1.829 m).   Weight  as of this encounter: 158.8 kg.  OSA/OHS -Continue CPAP nightly   DVT prophylaxis: SCDs Code Status:   Code Status: Full Code Family Communication: None at bedside Disposition Plan: Discharge home likely several days pending general surgery recommendation/management, ID recommendations and transition to outpatient antibiotics   Consultants:  Interventional radiology General surgery Infectious disease Cardiology  Procedures:  CT drainage of LLQ recurrent abscess. New 49F drain. Nuclear medicine scan  Antimicrobials: Zosyn  IV Ceftriaxone  Flagyl    Subjective: No concerns overnight.  Objective: BP 122/72 (BP Location: Left Arm)   Pulse 72   Temp 97.7 F (36.5 C) (Oral)   Resp 16   Ht 6' (1.829 m)   Wt (!) 158.8 kg   SpO2 95%   BMI 47.47 kg/m   Examination:  General exam: Appears calm and comfortable Respiratory system: Respiratory effort normal. Central nervous system: Alert and oriented. Psychiatry: Judgement and insight appear normal. Mood & affect appropriate.    Data Reviewed: I have personally reviewed following labs and imaging studies   Last CBC Lab Results  Component Value Date   WBC 9.2 05/01/2023   HGB 10.5 (L) 05/01/2023   HCT 32.0 (L) 05/01/2023   MCV 93.0 05/01/2023   MCH 30.5 05/01/2023   RDW 14.0 05/01/2023   PLT 305 05/01/2023     Last metabolic panel Lab Results  Component Value Date   GLUCOSE 114 (H) 05/01/2023   NA 135 05/01/2023   K 3.5 05/01/2023   CL 99 05/01/2023   CO2 26 05/01/2023   BUN 17 05/01/2023   CREATININE 1.54 (H) 05/01/2023   GFRNONAA 51 (L) 05/01/2023   CALCIUM  8.3 (L) 05/01/2023   PHOS 3.4 06/19/2022   PROT 6.4 (L) 05/01/2023   ALBUMIN  2.4 (L) 05/01/2023   LABGLOB 3.3 02/21/2023   AGRATIO 1.2  08/02/2022   BILITOT 0.5 05/01/2023   ALKPHOS 67 05/01/2023   AST 13 (L) 05/01/2023   ALT 13 05/01/2023   ANIONGAP 10 05/01/2023     Creatinine Clearance: Estimated Creatinine Clearance: 78.4 mL/min (A) (by C-G formula based on SCr of 1.54 mg/dL (H)).  Recent Results (from the past 240 hours)  Blood culture (routine x 2)     Status: None   Collection Time: 04/27/23  5:10 PM   Specimen: BLOOD  Result Value Ref Range Status   Specimen Description BLOOD SITE NOT SPECIFIED  Final   Special Requests   Final    BOTTLES DRAWN AEROBIC AND ANAEROBIC Blood Culture results may not be optimal due to an inadequate volume of blood received in culture bottles   Culture   Final    NO GROWTH 5 DAYS Performed at [facility], [address], [place], [place] [address]    Report Status 05/02/2023 FINAL  Final  Blood culture (routine x 2)     Status: None   Collection Time: 04/27/23 10:30 PM   Specimen: BLOOD RIGHT HAND  Result Value Ref Range Status   Specimen Description BLOOD RIGHT HAND  Final   Special Requests   Final    BOTTLES DRAWN AEROBIC AND ANAEROBIC Blood Culture results may not be optimal due to an inadequate volume of blood received in culture bottles   Culture   Final    NO GROWTH 5 DAYS Performed at [facility], [address], [place], [place] [address]    Report Status 05/02/2023 FINAL  Final  Aerobic/Anaerobic Culture w Gram Stain (surgical/deep wound)     Status: None (Preliminary result)  Collection Time: 04/28/23 12:02 PM   Specimen: Abdomen; Abscess  Result Value Ref Range Status   Specimen Description ABDOMEN  Final   Special Requests ABSCESS  Final   Gram Stain   Final    FEW WBC PRESENT, PREDOMINANTLY PMN ABUNDANT GRAM NEGATIVE RODS MODERATE GRAM POSITIVE COCCI IN PAIRS IN CLUSTERS FEW YEAST FEW GRAM POSITIVE RODS Performed at [facility], [address] [place], [place] [address]    Culture   Final    ABUNDANT STREPTOCOCCUS  INFANTARIUS ABUNDANT ESCHERICHIA COLI NO ANAEROBES ISOLATED; CULTURE IN PROGRESS FOR 5 DAYS    Report Status PENDING  Incomplete   Organism ID, Bacteria ESCHERICHIA COLI  Final   Organism ID, Bacteria STREPTOCOCCUS INFANTARIUS  Final      Susceptibility   Escherichia coli - MIC*    AMPICILLIN >=32 RESISTANT Resistant     CEFEPIME  <=0.12 SENSITIVE Sensitive     CEFTAZIDIME <=1 SENSITIVE Sensitive     CEFTRIAXONE  <=0.25 SENSITIVE Sensitive     CIPROFLOXACIN  1 RESISTANT Resistant     GENTAMICIN <=1 SENSITIVE Sensitive     IMIPENEM <=0.25 SENSITIVE Sensitive     TRIMETH /SULFA  <=20 SENSITIVE Sensitive     AMPICILLIN/SULBACTAM 16 INTERMEDIATE Intermediate     PIP/TAZO <=4 SENSITIVE Sensitive ug/mL    * ABUNDANT ESCHERICHIA COLI   Streptococcus infantarius - MIC*    PENICILLIN <=0.06 SENSITIVE Sensitive     CEFTRIAXONE  <=0.12 SENSITIVE Sensitive     ERYTHROMYCIN >=8 RESISTANT Resistant     LEVOFLOXACIN 4 INTERMEDIATE Intermediate     VANCOMYCIN 0.5 SENSITIVE Sensitive     * ABUNDANT STREPTOCOCCUS INFANTARIUS      Radiology Studies: NM Myocar Multi W/Spect W/Wall Motion / EF Result Date: 05/01/2023   Small, moderate, reversible perfusion defect in the apical septum, and apex consistent with ischemia in the LAD distribution. LVEF 49%. Intermediate risk study based on perfusion and LVEF. Poor quality study noted.   LV perfusion is abnormal. There is evidence of ischemia. There is no evidence of infarction. Defect 1: There is a small defect with severe reduction in uptake present in the apical to mid anteroseptal and apex location(s) that is reversible. There is abnormal wall motion in the defect area. Consistent with ischemia.   Left ventricular function is abnormal. Global function is mildly reduced. Nuclear stress EF: 49%. The left ventricular ejection fraction is mildly decreased (45-54%). End diastolic cavity size is mildly enlarged.   Findings are consistent with ischemia. The study is  intermediate risk.       LOS: 5 days    Elgin Lam, MD [facility] 05/03/2023, 5:24 AM   If 7PM-7AM, please contact night-coverage [email or web address]

## 2023-05-03 NOTE — Progress Notes (Signed)
 Subjective: CC: Stable abdominal pain. Tolerating diet without n/v. BM yesterday.   Afebrile. No tachycardia or hypotension. WBC 9.4  Objective: Vital signs in last 24 hours: Temp:  [97.7 F (36.5 C)-98.2 F (36.8 C)] 97.8 F (36.6 C) (02/05 0826) Pulse Rate:  [59-72] 59 (02/05 0826) Resp:  [16-19] 19 (02/05 0826) BP: (118-128)/(65-77) 118/77 (02/05 0826) SpO2:  [95 %-100 %] 95 % (02/05 0826) Last BM Date : 05/02/23  Intake/Output from previous day: 02/04 0701 - 02/05 0700 In: 290 [P.O.:280] Out: 35 [Drains:35] Intake/Output this shift: No intake/output data recorded.  PE: Gen:  Alert, NAD, pleasant Abd:  soft, left sided abdominal ttp. Left IR drain with brown thick output, 2 cm opening over the left mid abdomen with eakin's pouch over draining similar brown thick output.    Lab Results:  Recent Labs    05/01/23 0521 05/03/23 0641  WBC 9.2 9.4  HGB 10.5* 10.5*  HCT 32.0* 31.6*  PLT 305 364   BMET Recent Labs    05/01/23 0521 05/03/23 0641  NA 135 134*  K 3.5 3.4*  CL 99 96*  CO2 26 27  GLUCOSE 114* 142*  BUN 17 18  CREATININE 1.54* 1.46*  CALCIUM  8.3* 8.7*   PT/INR No results for input(s): LABPROT, INR in the last 72 hours. CMP     Component Value Date/Time   NA 134 (L) 05/03/2023 0641   NA 142 02/21/2023 1544   K 3.4 (L) 05/03/2023 0641   CL 96 (L) 05/03/2023 0641   CO2 27 05/03/2023 0641   GLUCOSE 142 (H) 05/03/2023 0641   BUN 18 05/03/2023 0641   BUN 18 02/21/2023 1544   CREATININE 1.46 (H) 05/03/2023 0641   CALCIUM  8.7 (L) 05/03/2023 0641   PROT 6.4 (L) 05/01/2023 0521   PROT 7.1 02/21/2023 1544   ALBUMIN  2.4 (L) 05/01/2023 0521   ALBUMIN  3.8 (L) 02/21/2023 1544   AST 13 (L) 05/01/2023 0521   ALT 13 05/01/2023 0521   ALKPHOS 67 05/01/2023 0521   BILITOT 0.5 05/01/2023 0521   BILITOT 0.3 02/21/2023 1544   GFRNONAA 54 (L) 05/03/2023 0641   GFRAA 68 12/16/2019 0000   Lipase     Component Value Date/Time   LIPASE 24  10/01/2022 0829    Studies/Results: NM Myocar Multi W/Spect W/Wall Motion / EF Result Date: 05/01/2023   Small, moderate, reversible perfusion defect in the apical septum, and apex consistent with ischemia in the LAD distribution. LVEF 49%. Intermediate risk study based on perfusion and LVEF. Poor quality study noted.   LV perfusion is abnormal. There is evidence of ischemia. There is no evidence of infarction. Defect 1: There is a small defect with severe reduction in uptake present in the apical to mid anteroseptal and apex location(s) that is reversible. There is abnormal wall motion in the defect area. Consistent with ischemia.   Left ventricular function is abnormal. Global function is mildly reduced. Nuclear stress EF: 49%. The left ventricular ejection fraction is mildly decreased (45-54%). End diastolic cavity size is mildly enlarged.   Findings are consistent with ischemia. The study is intermediate risk.    Anti-infectives: Anti-infectives (From admission, onward)    Start     Dose/Rate Route Frequency Ordered Stop   05/02/23 1700  cefTRIAXone  (ROCEPHIN ) 2 g in sodium chloride  0.9 % 100 mL IVPB        2 g 200 mL/hr over 30 Minutes Intravenous Every 24 hours 05/02/23 0949  05/02/23 1400  metroNIDAZOLE  (FLAGYL ) tablet 500 mg        500 mg Oral Every 12 hours 05/02/23 0949     04/28/23 0045  piperacillin -tazobactam (ZOSYN ) IVPB 3.375 g  Status:  Discontinued        3.375 g 12.5 mL/hr over 240 Minutes Intravenous Every 8 hours 04/28/23 0036 05/02/23 0949   04/27/23 1845  piperacillin -tazobactam (ZOSYN ) IVPB 3.375 g        3.375 g 100 mL/hr over 30 Minutes Intravenous  Once 04/27/23 1842 04/27/23 2006   04/27/23 1845  metroNIDAZOLE  (FLAGYL ) IVPB 500 mg        500 mg 100 mL/hr over 60 Minutes Intravenous  Once 04/27/23 1842 04/27/23 2117        Assessment/Plan 63 yo male with 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 This has been going on for 7  months now. He has lost over 100 pounds in the last year. He still has significant comorbidities of a fib, DM II, CHF, h/o stroke, OSA, h/o PE 2019   - Cards note reviewed. Small area of distal anteroapical reversible ischemia - possibly artifact, but with some apical hypokinesis and LVEF 49%, may be ischemia - if so, could be distal LAD.  Recommended for medical therapy with No cath this time.  Can hold Eliquis 2 days prior to surgery. Has f/u arranged 3/6 - Abx per ID - Drain per IR - placed 1/31. Drain per their team. Will need f/u and repeat imaging before seeing us  in the office - [facility] following for pouching and assistance with fistula peri-wound care. Bedside teaching, TOC consult for [facility] and referral to [facility] clinic  - Albumin  2.4. Pre-alb 11. RD consult to maximize nutrition. Will plan repeat nutrition labs before f/u in the office - Surgery certainly only way to fix this. Has been going on for 9 months almost.  At this time we do not think he needs urgent/emergent surgery at this time.  Plan to work on his nutrition and have him follow-up with colorectal surgery to discuss further as an outpatient. My colleague has already arranged this f/u.    FEN - HHD and ensure VTE - 80 mg lovenox  daily ID - Rocephin /Flagyl   I reviewed nursing notes, Consultant (ID, Cards) notes, last 24 h vitals and pain scores, last 48 h intake and output, last 24 h labs and trends, and last 24 h imaging results.   LOS: 5 days    Bradley Torres , [facility] Surgery 05/03/2023, 9:32 AM Please see Amion for pager number during day hours 7:00am-4:30pm

## 2023-05-03 NOTE — Progress Notes (Signed)
 Pouch emptied-- 50ml.

## 2023-05-03 NOTE — Progress Notes (Addendum)
 Referring Provider(s): L. Kinsinger   Supervising Physician: Jennefer Rover  Patient Status:  [facility] - In-pt  Chief Complaint:  Diverticular Abscess  Colocutaneous Fistula   Brief History:  Previous Diverticular abscess with prior drainage.  Presented to ED with skin tract drainage.  IR placed new drain LLQ on 04/28/23.    Subjective:  Pt states he is feeling well today, sitting up in bed and talkative   Allergies: Aldactone  [spironolactone ], Bystolic  [nebivolol  hcl], Motrin [ibuprofen], Actos  [pioglitazone ], Trulicity  [dulaglutide ], Zestril  [lisinopril ], Cozaar  [losartan ], Diflucan  [fluconazole ], Hctz [hydrochlorothiazide ], and Norvasc  [amlodipine ]  Medications: Prior to Admission medications   Medication Sig Start Date End Date Taking? Authorizing Provider  allopurinol  (ZYLOPRIM ) 100 MG tablet TAKE ONE TABLET BY MOUTH ONCE DAILY 04/04/23  Yes Zollie Lowers, MD  amiodarone  (PACERONE ) 200 MG tablet Take 1 tablet (200 mg total) by mouth daily. 01/12/23  Yes Alvan Dorn FALCON, MD  atorvastatin  (LIPITOR ) 20 MG tablet TAKE ONE TABLET BY MOUTH ONCE DAILY 08/01/22  Yes Alvan Dorn FALCON, MD  chlorthalidone  (HYGROTON ) 25 MG tablet TAKE 1 TABLET BY MOUTH EVERY OTHER DAY 01/20/23  Yes Miriam Norris, NP  Cholecalciferol (VITAMIN D -3 PO) Take 1 tablet by mouth daily.   Yes [provider]  dapagliflozin  propanediol (FARXIGA ) 10 MG TABS tablet TAKE ONE TABLET BY MOUTH DAILY BEFORE BREAKFAST 04/05/23  Yes Stacks, Lowers, MD  docusate sodium  (COLACE) 100 MG capsule Take 1 capsule (100 mg total) by mouth 2 (two) times daily. Patient taking differently: Take 100 mg by mouth 2 (two) times daily as needed (constipation.). 06/28/22  Yes Krishnan, Gokul, MD  hydrALAZINE  (APRESOLINE ) 25 MG tablet Take 1 tablet (25 mg total) by mouth daily. 09/08/22  Yes Miriam Norris, NP  JANUVIA  100 MG tablet TAKE ONE TABLET BY MOUTH ONCE DAILY 04/05/23  Yes Zollie Lowers, MD  metFORMIN  (GLUCOPHAGE -XR) 750 MG  24 hr tablet Take 2 tablets (1,500 mg total) by mouth daily with breakfast. Patient taking differently: Take 750 mg by mouth daily with breakfast. 05/11/22  Yes Zollie Lowers, MD  oxyCODONE -acetaminophen  (PERCOCET) 10-325 MG tablet Take 1 tablet by mouth 3 (three) times daily as needed for pain. Patient taking differently: Take 1 tablet by mouth every 6 (six) hours as needed for pain. 06/28/22  Yes Krishnan, Gokul, MD  polyethylene glycol (MIRALAX  / GLYCOLAX ) 17 g packet Take 17 g by mouth daily. Patient taking differently: Take 17 g by mouth daily as needed (constipation.). 06/28/22  Yes Krishnan, Gokul, MD  Prasterone, DHEA, (DHEA PO) Take 1 capsule by mouth daily.   Yes [provider]  rivaroxaban  (XARELTO ) 20 MG TABS tablet TAKE ONE TABLET BY MOUTH DAILY WITH SUPPER 03/20/23  Yes Stacks, Lowers, MD  senna-docusate (SENOKOT-S) 8.6-50 MG tablet Take 2 tablets by mouth at bedtime. Patient taking differently: Take 2 tablets by mouth at bedtime as needed (constipation). 06/28/22  Yes Krishnan, Gokul, MD  valsartan  (DIOVAN ) 160 MG tablet TAKE 1 TABLET BY MOUTH EVERY DAY 03/15/23  Yes Zollie Lowers, MD  Accu-Chek Softclix Lancets lancets Use as instructed to monitor glucose twice daily 09/23/21   Therisa Benton PARAS, NP  glucose blood (ACCU-CHEK GUIDE) test strip Use as instructed to monitor glucose twice daily 10/28/21   Therisa Benton PARAS, NP     Vital Signs: BP 118/77 (BP Location: Right Arm)   Pulse (!) 59   Temp 97.8 F (36.6 C)   Resp 19   Ht 6' (1.829 m)   Wt (!) 350 lb (158.8 kg)  SpO2 95%   BMI 47.47 kg/m   Physical Exam Constitutional:      Appearance: Normal appearance.  HENT:     Head: Normocephalic and atraumatic.     Mouth/Throat:     Mouth: Mucous membranes are moist.  Cardiovascular:     Rate and Rhythm: Normal rate and regular rhythm.  Pulmonary:     Effort: Pulmonary effort is normal.     Breath sounds: Normal breath sounds.  Abdominal:     General: Bowel  sounds are normal.  Musculoskeletal:        General: Normal range of motion.     Cervical back: Normal range of motion.  Skin:    General: Skin is warm and dry.     Comments: Drain site clean, dry, no redness  No signs of infection  Feculent odor throughout  F/A Easily - brown/stool like aspiration  15 cc in JP   Neurological:     Mental Status: He is alert and oriented to person, place, and time.  Psychiatric:        Mood and Affect: Mood normal.        Behavior: Behavior normal.     Labs:  CBC: Recent Labs    04/29/23 0543 04/30/23 1009 05/01/23 0521 05/03/23 0641  WBC 7.3 8.8 9.2 9.4  HGB 10.6* 10.5* 10.5* 10.5*  HCT 32.3* 31.3* 32.0* 31.6*  PLT 284 276 305 364    COAGS: No results for input(s): INR, APTT in the last 8760 hours.  BMP: Recent Labs    04/29/23 0543 04/30/23 0921 05/01/23 0521 05/03/23 0641  NA 134* 134* 135 134*  K 3.5 3.5 3.5 3.4*  CL 97* 100 99 96*  CO2 26 24 26 27   GLUCOSE 199* 129* 114* 142*  BUN 33* 21 17 18   CALCIUM  8.3* 8.2* 8.3* 8.7*  CREATININE 2.35* 1.63* 1.54* 1.46*  GFRNONAA 31* 48* 51* 54*    LIVER FUNCTION TESTS: Recent Labs    10/01/22 0829 02/21/23 1544 04/27/23 1810 05/01/23 0521  BILITOT 1.0 0.3 0.7 0.5  AST 13* 11 22 13*  ALT 10 11 30 13   ALKPHOS 56 93 95 67  PROT 7.1 7.1 7.9 6.4*  ALBUMIN  2.9* 3.8* 3.1* 2.4*   Drain Location: LLQ Size: Fr size: 10 Fr Date of placement: 04/28/23  Currently to: Drain collection device: suction bulb 24 hour output:  Output by Drain (mL) 05/01/23 0701 - 05/01/23 1900 05/01/23 1901 - 05/02/23 0700 05/02/23 0701 - 05/02/23 1900 05/02/23 1901 - 05/03/23 0700 05/03/23 0701 - 05/03/23 0924  Closed System Drain Lateral LUQ 10 Fr. 85  10 25     Interval imaging/drain manipulation:  None   Current examination: Flushes/aspirates easily.  Insertion site unremarkable. Suture and stat lock in place. Dressed appropriately.   Plan: Continue TID flushes with 5 cc NS. Record  output Q shift. Dressing changes QD or PRN if soiled.  Call IR APP or on call IR MD if difficulty flushing or sudden change in drain output.  Repeat imaging/possible drain injection once output < 10 mL/QD (excluding flush material). Consideration for drain removal if output is < 10 mL/QD (excluding flush material), pending discussion with the providing surgical service.  Discharge planning: Please contact IR APP or on call IR MD prior to patient d/c to ensure appropriate follow up plans are in place. Typically patient will follow up with IR clinic 10-14 days post d/c for repeat imaging/possible drain injection. IR scheduler will contact patient with date/time of  appointment. Patient will need to flush drain QD with 5 cc NS, record output QD, dressing changes every 2-3 days or earlier if soiled.   IR will continue to follow - please call with questions or concerns.    Assessment and Plan:  IR will follow abscess drain with CCS  CCS working with nutrition prior to surgery, possibility of surgery being outpatient   Electronically Signed: Lavanda JAYSON Jurist, PA-C 05/03/2023, 9:20 AM    I spent a total of 15 Minutes at the the patient's bedside AND on the patient's hospital floor or unit, greater than 50% of which was counseling/coordinating care for LLQ abscess drain.

## 2023-05-03 NOTE — Progress Notes (Signed)
Patients weight checked -- 338.1  IBS

## 2023-05-03 NOTE — Progress Notes (Signed)
 Nutrition Education Note  RD consulted to maximize nutrition before colorectal surgery.   He reports being 420 lbs one year ago and has lost 100 pounds since then to his lowest weight of 329 lbs due to diverticulitis. He was only eating 1 meal per day during this time. He states his weight has slowly increased  and his new baseline is around 330-350 lbs now. Reviewed weight history and his weight from May 2024 until now is stable. Requested updated weight from RN.   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 He typically only eats 2 meals per day. Wakes up around 2 PM where he has his first meal, does not typically eat breakfast. Usually eats hotdogs, hamburgers, pot roast and snacks on chips and vienna sausages. Drinks sweet tea with every meal and soda throughout the day.   Albumin  is not an indicator of nutritional status, but rather an indicator of morbidity and mortality, and recovery from acute and chronic illness.  Patient not amendable to handouts. Reviewed patient's dietary recall. Provided examples on ways to increase protein and decrease sugar intake, as well as general healthy eating. Discouraged intake of processed foods and intake of sugar sweetened beverages. Encouraged fresh fruits and vegetables as well as whole grain sources of carbohydrates to maximize fiber intake. Discussed different food groups and their effects on blood sugar, emphasizing carbohydrate-containing foods.   Expect fair compliance.  Body mass index is 47.47 kg/m. Pt meets criteria for class III obesity based on current BMI.  Current diet order is Heart healthy/carb modified, patient is consuming approximately 100% of meals at this time and drinking 2 Ensure per day. Labs and medications reviewed. No further nutrition interventions warranted at this time. RD contact information provided. If additional nutrition issues arise, please re-consult RD.    Olivia Kenning,  RD Registered Dietitian  See Amion for more information

## 2023-05-03 NOTE — TOC Progression Note (Addendum)
 Transition of Care (TOC) - Progression Note   Received orders for home health RN for :  Colocutaneous fistula with overlying Eakin's pouch. IR drain   Explained nurse will provide wound and drain care education prior to discharge. Patient was doing drain and wound care prior to admission , however Eakin pouch is new. He would need eakin pouch education. Per WOC nurse Eakin pouches are not covered by insurance. Patient aware . Will await discharge wound care instructions.    Per WOC Eakin pouches $45.00 each. Surgery aware . Await discharge wound instructions.    Patient does not want  a HHRN. He is of course requesting education on Eakin pouch and drain. Bedside nurse and WOC aware.   WOC recommends bedside nurse to give patient 1 or 2 Eakin pouchs at discharge. Nurse aware     WOC has provided education to patient and 4 sets of supplies    Patient Details  Name: Bradley Torres MRN: [id number] Date of Birth: [birth year]  Transition of Care [facility]) CM/SW Contact  Mitzie Marlar, Powell Jansky, RN Phone Number: 05/03/2023, 11:23 AM  Clinical Narrative:       Expected Discharge Plan: Home/Self Care Barriers to Discharge: Continued Medical Work up  Expected Discharge Plan and Services   Discharge Planning Services: CM Consult Post Acute Care Choice: Home Health Living arrangements for the past 2 months: Single Family Home                 DME Arranged: N/A         HH Arranged: Patient Refused HH           Social Determinants of Health (SDOH) Interventions SDOH Screenings   Food Insecurity: No Food Insecurity (04/28/2023)  Housing: Low Risk  (04/28/2023)  Transportation Needs: No Transportation Needs (04/28/2023)  Utilities: Not At Risk (04/28/2023)  Depression (PHQ2-9): Low Risk  (02/21/2023)  Financial Resource Strain: Medium Risk (05/18/2020)  Social Connections: Unknown (01/16/2020)  Stress: Stress Concern Present (05/15/2019)  Tobacco Use: Low Risk  (04/27/2023)    Readmission  Risk Interventions     No data to display

## 2023-05-03 NOTE — Plan of Care (Signed)
  Problem: Coping: Goal: Ability to adjust to condition or change in health will improve Outcome: Progressing   Problem: Fluid Volume: Goal: Ability to maintain a balanced intake and output will improve Outcome: Progressing   Problem: Health Behavior/Discharge Planning: Goal: Ability to identify and utilize available resources and services will improve Outcome: Progressing Goal: Ability to manage health-related needs will improve Outcome: Progressing   Problem: Metabolic: Goal: Ability to maintain appropriate glucose levels will improve Outcome: Progressing   Problem: Nutritional: Goal: Progress toward achieving an optimal weight will improve Outcome: Progressing   Problem: Skin Integrity: Goal: Risk for impaired skin integrity will decrease Outcome: Progressing   Problem: Tissue Perfusion: Goal: Adequacy of tissue perfusion will improve Outcome: Progressing   Problem: Education: Goal: Knowledge of General Education information will improve Description: Including pain rating scale, medication(s)/side effects and non-pharmacologic comfort measures Outcome: Progressing   Problem: Health Behavior/Discharge Planning: Goal: Ability to manage health-related needs will improve Outcome: Progressing

## 2023-05-03 NOTE — Progress Notes (Addendum)
 Emptied Eakin pouch-- 50ml purulent foul drainage.

## 2023-05-03 NOTE — Plan of Care (Signed)
  Problem: Fluid Volume: Goal: Ability to maintain a balanced intake and output will improve Outcome: Progressing   Problem: Coping: Goal: Ability to adjust to condition or change in health will improve Outcome: Progressing   Problem: Nutritional: Goal: Progress toward achieving an optimal weight will improve Outcome: Progressing   Problem: Skin Integrity: Goal: Risk for impaired skin integrity will decrease Outcome: Progressing   Problem: Coping: Goal: Level of anxiety will decrease Outcome: Progressing   Problem: Safety: Goal: Ability to remain free from injury will improve Outcome: Progressing   Problem: Pain Managment: Goal: General experience of comfort will improve and/or be controlled Outcome: Progressing

## 2023-05-03 NOTE — Plan of Care (Signed)
  Problem: Education: Goal: Ability to describe self-care measures that may prevent or decrease complications (Diabetes Survival Skills Education) will improve Outcome: Progressing Goal: Individualized Educational Video(s) Outcome: Progressing   Problem: Coping: Goal: Ability to adjust to condition or change in health will improve Outcome: Progressing   Problem: Fluid Volume: Goal: Ability to maintain a balanced intake and output will improve Outcome: Progressing   Problem: Health Behavior/Discharge Planning: Goal: Ability to identify and utilize available resources and services will improve Outcome: Progressing Goal: Ability to manage health-related needs will improve Outcome: Progressing   Problem: Nutritional: Goal: Progress toward achieving an optimal weight will improve Outcome: Progressing

## 2023-05-04 DIAGNOSIS — K5732 Diverticulitis of large intestine without perforation or abscess without bleeding: Secondary | ICD-10-CM | POA: Diagnosis not present

## 2023-05-04 DIAGNOSIS — L02211 Cutaneous abscess of abdominal wall: Secondary | ICD-10-CM | POA: Diagnosis not present

## 2023-05-04 DIAGNOSIS — K632 Fistula of intestine: Secondary | ICD-10-CM | POA: Diagnosis not present

## 2023-05-04 DIAGNOSIS — K651 Peritoneal abscess: Secondary | ICD-10-CM | POA: Diagnosis not present

## 2023-05-04 LAB — GLUCOSE, CAPILLARY
Glucose-Capillary: 118 mg/dL — ABNORMAL HIGH (ref 70–99)
Glucose-Capillary: 202 mg/dL — ABNORMAL HIGH (ref 70–99)
Glucose-Capillary: 121 mg/dL — ABNORMAL HIGH (ref 70–99)
Glucose-Capillary: 179 mg/dL — ABNORMAL HIGH (ref 70–99)

## 2023-05-04 NOTE — Progress Notes (Signed)
 Emptied 75 ml from eakin pouch.

## 2023-05-04 NOTE — Progress Notes (Signed)
 Subjective: CC: Stable abdominal pain. Tolerating diet without n/v. BM yesterday.   Afebrile. No tachycardia or hypotension. WBC 9.4 on last check.   Objective: Vital signs in last 24 hours: Temp:  [97.6 F (36.4 C)-98.3 F (36.8 C)] 97.7 F (36.5 C) (02/06 0517) Pulse Rate:  [64-86] 64 (02/06 0517) BP: (117-132)/(65-79) 120/65 (02/06 0517) SpO2:  [94 %-97 %] 94 % (02/06 0517) Weight:  [153.4 kg] 153.4 kg (02/05 1646) Last BM Date : 05/03/23  Intake/Output from previous day: 02/05 0701 - 02/06 0700 In: 1330 [P.O.:1320] Out: 105 [Drains:55; Stool:50] Intake/Output this shift: No intake/output data recorded.  PE: Gen:  Alert, NAD, pleasant Abd:  soft, left sided abdominal ttp. Left IR drain with brown thick output, 2 cm opening over the left mid abdomen with eakin's pouch over draining similar brown thick output.    Lab Results:  Recent Labs    05/03/23 0641  WBC 9.4  HGB 10.5*  HCT 31.6*  PLT 364   BMET Recent Labs    05/03/23 0641  NA 134*  K 3.4*  CL 96*  CO2 27  GLUCOSE 142*  BUN 18  CREATININE 1.46*  CALCIUM  8.7*   PT/INR No results for input(s): LABPROT, INR in the last 72 hours. CMP     Component Value Date/Time   NA 134 (L) 05/03/2023 0641   NA 142 02/21/2023 1544   K 3.4 (L) 05/03/2023 0641   CL 96 (L) 05/03/2023 0641   CO2 27 05/03/2023 0641   GLUCOSE 142 (H) 05/03/2023 0641   BUN 18 05/03/2023 0641   BUN 18 02/21/2023 1544   CREATININE 1.46 (H) 05/03/2023 0641   CALCIUM  8.7 (L) 05/03/2023 0641   PROT 6.4 (L) 05/01/2023 0521   PROT 7.1 02/21/2023 1544   ALBUMIN  2.4 (L) 05/01/2023 0521   ALBUMIN  3.8 (L) 02/21/2023 1544   AST 13 (L) 05/01/2023 0521   ALT 13 05/01/2023 0521   ALKPHOS 67 05/01/2023 0521   BILITOT 0.5 05/01/2023 0521   BILITOT 0.3 02/21/2023 1544   GFRNONAA 54 (L) 05/03/2023 0641   GFRAA 68 12/16/2019 0000   Lipase     Component Value Date/Time   LIPASE 24 10/01/2022 0829    Studies/Results: No  results found.   Anti-infectives: Anti-infectives (From admission, onward)    Start     Dose/Rate Route Frequency Ordered Stop   05/02/23 1700  cefTRIAXone  (ROCEPHIN ) 2 g in sodium chloride  0.9 % 100 mL IVPB        2 g 200 mL/hr over 30 Minutes Intravenous Every 24 hours 05/02/23 0949     05/02/23 1400  metroNIDAZOLE  (FLAGYL ) tablet 500 mg        500 mg Oral Every 12 hours 05/02/23 0949     04/28/23 0045  piperacillin -tazobactam (ZOSYN ) IVPB 3.375 g  Status:  Discontinued        3.375 g 12.5 mL/hr over 240 Minutes Intravenous Every 8 hours 04/28/23 0036 05/02/23 0949   04/27/23 1845  piperacillin -tazobactam (ZOSYN ) IVPB 3.375 g        3.375 g 100 mL/hr over 30 Minutes Intravenous  Once 04/27/23 1842 04/27/23 2006   04/27/23 1845  metroNIDAZOLE  (FLAGYL ) IVPB 500 mg        500 mg 100 mL/hr over 60 Minutes Intravenous  Once 04/27/23 1842 04/27/23 2117        Assessment/Plan 63 yo male with 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 This has been going on for 7  months now. He has lost over 100 pounds in the last year. He still has significant comorbidities of a fib, DM II, CHF, h/o stroke, OSA, h/o PE 2019   - Cards note reviewed. Small area of distal anteroapical reversible ischemia - possibly artifact, but with some apical hypokinesis and LVEF 49%, may be ischemia - if so, could be distal LAD.  Recommended for medical therapy with No cath this time.  Can hold Eliquis 2 days prior to surgery. Has f/u arranged 3/6 - Abx per ID - Drain per IR - placed 1/31. Drain per their team. Will need f/u and repeat imaging before seeing us  in the office - [facility] following for pouching and assistance with fistula peri-wound care. Bedside teaching, TOC consult for [facility] and referral to [facility] clinic  - Albumin  2.4. Pre-alb 11. RD consult to maximize nutrition. Will plan repeat nutrition labs before f/u in the office - Surgery certainly only way to fix this. Has been going on for  9 months almost.  At this time we do not think he needs urgent/emergent surgery at this time.  Plan to work on his nutrition and have him follow-up with colorectal surgery to discuss further as an outpatient. My colleague has already arranged this f/u.  - We will sign off. Please call back with any changes, questions or concerns.    FEN - HHD and ensure VTE - Xarelto  ID - Rocephin /Flagyl   I reviewed nursing notes, Consultant (ID, Cards) notes, last 24 h vitals and pain scores, last 48 h intake and output, last 24 h labs and trends, and last 24 h imaging results.   LOS: 6 days    Bradley Torres Shaper , [name] 05/04/2023, 8:34 AM Please see Amion for pager number during day hours 7:00am-4:30pm

## 2023-05-04 NOTE — Progress Notes (Signed)
 Eakin's Pouch emptied 125 mL

## 2023-05-04 NOTE — Progress Notes (Signed)
 Eakin's Pouch emptied 50 mL

## 2023-05-04 NOTE — Plan of Care (Signed)
  Problem: Education: Goal: Ability to describe self-care measures that may prevent or decrease complications (Diabetes Survival Skills Education) will improve Outcome: Progressing Goal: Individualized Educational Video(s) Outcome: Progressing   Problem: Coping: Goal: Ability to adjust to condition or change in health will improve Outcome: Progressing   Problem: Fluid Volume: Goal: Ability to maintain a balanced intake and output will improve Outcome: Progressing   Problem: Health Behavior/Discharge Planning: Goal: Ability to identify and utilize available resources and services will improve Outcome: Progressing Goal: Ability to manage health-related needs will improve Outcome: Progressing   Problem: Nutritional: Goal: Progress toward achieving an optimal weight will improve Outcome: Progressing

## 2023-05-04 NOTE — Plan of Care (Signed)
  Problem: Fluid Volume: Goal: Ability to maintain a balanced intake and output will improve Outcome: Progressing   Problem: Metabolic: Goal: Ability to maintain appropriate glucose levels will improve Outcome: Progressing   Problem: Nutritional: Goal: Progress toward achieving an optimal weight will improve Outcome: Progressing   Problem: Tissue Perfusion: Goal: Adequacy of tissue perfusion will improve Outcome: Progressing   Problem: Clinical Measurements: Goal: Will remain free from infection Outcome: Progressing   Problem: Nutrition: Goal: Adequate nutrition will be maintained Outcome: Progressing

## 2023-05-04 NOTE — Progress Notes (Signed)
 PROGRESS NOTE    Bradley Torres  FMW:[id number] DOB: 1961-03-24 DOA: 04/27/2023 PCP: Zollie Lowers, MD   Brief Narrative: Bradley Torres is a 63 y.o. male with a history of CAD, paroxysmal atrial fibrillation, diabetes mellitus type 2, gout, hyperlipidemia, hypertension, obesity class III, OSA, OHS, pulm hypertension, chronic heart failure, chronic back pain, CKD stage IIIa.  Patient presented secondary to an abdominal wall wound with feculent/purulent drainage and was found to have evidence of an abdominal wall abscess.  Empiric antibiotics started, blood cultures obtained, interventional radiology consulted and placed a percutaneous drain for a recurrent LLQ abscess. ID consulted. General surgery consulted with plan for surgical management. Cardiology consulted for perioperative risk assessment with stress test significant for ischemia; medical management recommended.   Assessment/Plan:  Intraabdominal abscess CT abdomen/pelvis this admission was significant for an abscess measuring 4.3 x 3.6 cm without clear evidence of a connection to the intra-abdominal contents/subadjacent bowel.  Empiric Zosyn  started.  IR placed new drain LLQ on 04/28/23; superficial abscess was noted to have decompressed itself during IR's procedure. Wound cultures significant for Streptococcus infantarius and E. Coli. Blood cultures with no growth. -ID recommendations: Switched Zosyn  IV to Ceftriaxone  and Flagyl  on 2/4 --cont ceftriaxone  and flagyl  pending ID rec --cont drain management --Please contact IR APP or on call IR MD prior to patient d/c to ensure appropriate follow up plans are in place. Typically patient will follow up with IR clinic 10-14 days post d/c for repeat imaging/possible drain injection. IR scheduler will contact patient with date/time of appointment. Patient will need to flush drain QD with 5 cc NS, record output QD, dressing changes every 2-3 days or earlier if soiled.   Chronic colocutaneous  fistula Although no fistula tract seen on imaging, clinically correlates. General surgery consulted and plan for surgical intervention. ID consulted. Cardiology consulted for perioperative risk assessment/reduction. Stress test performed for perioperative risk assessment which suggests ischemia. Cardiology plan for continued medical management. No cath recommended.  -ID recommendations: As above --cont Eakin pouch and teaching --per GenSurg,- Surgery certainly only way to fix this. Has been going on for 9 months almost. At this time we do not think he needs urgent/emergent surgery at this time. Plan to work on his nutrition and have him follow-up with colorectal surgery to discuss further as an outpatient.  AKI on CKD stage IIIa Patient's baseline creatinine is about 1.4.  Patient with a BUN/creatinine of 33/2.2 respectively on admission.  CT on admission without evidence of hydronephrosis.  Creatinine peak of 2.40. Improved with IV fluids. Creatinine down to 1.54, near baseline.  Paroxysmal atrial fibrillation Currently in sinus rhythm.  Patient is on Xarelto , amiodarone  as an outpatient.  Xarelto  held secondary to anticipated procedure.  Amiodarone  held on admission secondary to prolonged QTc.  QTc slightly decreased from admission this morning. Cardiology cleared to restart amiodarone  -Continue home amiodarone  --cont home Xarelto   QTc prolongation Initial QTc measured to be 550 ms, subsequently down to 538 ms. --no need for tele  Chronic heart failure with mildly reduced ejection fraction Stable.  Last LVEF of 50 to 55% with associated moderately reduced RV systolic function.  Patient appears euvolemic at this time.  History of CVA Noted.  Patient on Xarelto  as an outpatient.  Patient is also on Lipitor . --cont Lipitor   Gout -Continue allopurinol   Hyperlipidemia -Continue Lipitor   CAD Noted.  Primary hypertension Patient is on hydralazine , chlorthalidone , valsartan  as an  outpatient.  Antihypertensives were held on admission.  Patient is  currently normotensive with hypotension initially on presentation. --hold home antihypertensives  Diabetes mellitus, type II Controlled with hemoglobin A1c of 6.5%.  Patient is managed on Januvia , metformin , Farxiga  as an outpatient.  SSI started on admission. --SSI  History of DVT/PE Remote history from 2019.  Patient is on Xarelto  as an outpatient. --cont Xarelto   Obesity, class III Estimated body mass index is 45.85 kg/m as calculated from the following:   Height as of this encounter: 6' (1.829 m).   Weight as of this encounter: 153.4 kg.  OSA/OHS -Continue CPAP nightly   DVT prophylaxis: SCDs Code Status:   Code Status: Full Code Family Communication:  Disposition Plan: can discharge home after ID finalizes abx plan.   Consultants:  Interventional radiology General surgery Infectious disease Cardiology  Procedures:  CT drainage of LLQ recurrent abscess. New 26F drain. Nuclear medicine scan  Antimicrobials: Zosyn  IV Ceftriaxone  Flagyl    Subjective: Pt reported abdomen only mildly painful.  Pt reported he did not know well how to manage his Eakin's pouch yet.   Objective: BP 120/72 (BP Location: Left Arm)   Pulse 64   Temp 98.1 F (36.7 C) (Oral)   Resp 20   Ht 6' (1.829 m)   Wt (!) 153.4 kg   SpO2 96%   BMI 45.85 kg/m   Examination:  Constitutional: NAD, AAOx3 HEENT: conjunctivae and lids normal, EOMI CV: No cyanosis.   RESP: normal respiratory effort, on RA Abdomen: Eakin's pouch on the left side with liquid stool inside, drain with milky brown fluid in the bulb Neuro: II - XII grossly intact.   Psych: Normal mood and affect.  Appropriate judgement and reason   Data Reviewed: I have personally reviewed following labs and imaging studies   Last CBC Lab Results  Component Value Date   WBC 9.4 05/03/2023   HGB 10.5 (L) 05/03/2023   HCT 31.6 (L) 05/03/2023   MCV 92.4  05/03/2023   MCH 30.7 05/03/2023   RDW 14.0 05/03/2023   PLT 364 05/03/2023     Last metabolic panel Lab Results  Component Value Date   GLUCOSE 142 (H) 05/03/2023   NA 134 (L) 05/03/2023   K 3.4 (L) 05/03/2023   CL 96 (L) 05/03/2023   CO2 27 05/03/2023   BUN 18 05/03/2023   CREATININE 1.46 (H) 05/03/2023   GFRNONAA 54 (L) 05/03/2023   CALCIUM  8.7 (L) 05/03/2023   PHOS 3.4 06/19/2022   PROT 6.4 (L) 05/01/2023   ALBUMIN  2.4 (L) 05/01/2023   LABGLOB 3.3 02/21/2023   AGRATIO 1.2 08/02/2022   BILITOT 0.5 05/01/2023   ALKPHOS 67 05/01/2023   AST 13 (L) 05/01/2023   ALT 13 05/01/2023   ANIONGAP 11 05/03/2023     Creatinine Clearance: Estimated Creatinine Clearance: 81.1 mL/min (A) (by C-G formula based on SCr of 1.46 mg/dL (H)).  Recent Results (from the past 240 hours)  Blood culture (routine x 2)     Status: None   Collection Time: 04/27/23  5:10 PM   Specimen: BLOOD  Result Value Ref Range Status   Specimen Description BLOOD SITE NOT SPECIFIED  Final   Special Requests   Final    BOTTLES DRAWN AEROBIC AND ANAEROBIC Blood Culture results may not be optimal due to an inadequate volume of blood received in culture bottles   Culture   Final    NO GROWTH 5 DAYS Performed at [facility], [address] [place], [place] [address]    Report Status 05/02/2023 FINAL  Final  Blood culture (routine x 2)     Status: None   Collection Time: 04/27/23 10:30 PM   Specimen: BLOOD RIGHT HAND  Result Value Ref Range Status   Specimen Description BLOOD RIGHT HAND  Final   Special Requests   Final    BOTTLES DRAWN AEROBIC AND ANAEROBIC Blood Culture results may not be optimal due to an inadequate volume of blood received in culture bottles   Culture   Final    NO GROWTH 5 DAYS Performed at [facility], [address] [place], [place] [address]    Report Status 05/02/2023 FINAL  Final  Aerobic/Anaerobic Culture w Gram Stain (surgical/deep wound)     Status: None  (Preliminary result)   Collection Time: 04/28/23 12:02 PM   Specimen: Abdomen; Abscess  Result Value Ref Range Status   Specimen Description ABDOMEN  Final   Special Requests ABSCESS  Final   Gram Stain   Final    FEW WBC PRESENT, PREDOMINANTLY PMN ABUNDANT GRAM NEGATIVE RODS MODERATE GRAM POSITIVE COCCI IN PAIRS IN CLUSTERS FEW YEAST FEW GRAM POSITIVE RODS Performed at [facility], [address], [place], [place] [address]    Culture   Final    ABUNDANT STREPTOCOCCUS INFANTARIUS ABUNDANT ESCHERICHIA COLI ABUNDANT CLOSTRIDIUM PERFRINGENS    Report Status PENDING  Incomplete   Organism ID, Bacteria ESCHERICHIA COLI  Final   Organism ID, Bacteria STREPTOCOCCUS INFANTARIUS  Final      Susceptibility   Escherichia coli - MIC*    AMPICILLIN >=32 RESISTANT Resistant     CEFEPIME  <=0.12 SENSITIVE Sensitive     CEFTAZIDIME <=1 SENSITIVE Sensitive     CEFTRIAXONE  <=0.25 SENSITIVE Sensitive     CIPROFLOXACIN  1 RESISTANT Resistant     GENTAMICIN <=1 SENSITIVE Sensitive     IMIPENEM <=0.25 SENSITIVE Sensitive     TRIMETH /SULFA  <=20 SENSITIVE Sensitive     AMPICILLIN/SULBACTAM 16 INTERMEDIATE Intermediate     PIP/TAZO <=4 SENSITIVE Sensitive ug/mL    * ABUNDANT ESCHERICHIA COLI   Streptococcus infantarius - MIC*    PENICILLIN <=0.06 SENSITIVE Sensitive     CEFTRIAXONE  <=0.12 SENSITIVE Sensitive     ERYTHROMYCIN >=8 RESISTANT Resistant     LEVOFLOXACIN 4 INTERMEDIATE Intermediate     VANCOMYCIN 0.5 SENSITIVE Sensitive     * ABUNDANT STREPTOCOCCUS INFANTARIUS      Radiology Studies: No results found.      LOS: 6 days

## 2023-05-05 DIAGNOSIS — K5720 Diverticulitis of large intestine with perforation and abscess without bleeding: Secondary | ICD-10-CM | POA: Diagnosis not present

## 2023-05-05 DIAGNOSIS — L02211 Cutaneous abscess of abdominal wall: Secondary | ICD-10-CM | POA: Diagnosis not present

## 2023-05-05 LAB — BASIC METABOLIC PANEL
Anion gap: 10 (ref 5–15)
BUN: 19 mg/dL (ref 8–23)
CO2: 27 mmol/L (ref 22–32)
Calcium: 8.9 mg/dL (ref 8.9–10.3)
Chloride: 99 mmol/L (ref 98–111)
Creatinine, Ser: 1.47 mg/dL — ABNORMAL HIGH (ref 0.61–1.24)
GFR, Estimated: 54 mL/min — ABNORMAL LOW (ref >60)
Glucose, Bld: 141 mg/dL — ABNORMAL HIGH (ref 70–99)
Potassium: 4.4 mmol/L (ref 3.5–5.1)
Sodium: 136 mmol/L (ref 135–145)

## 2023-05-05 LAB — GLUCOSE, CAPILLARY
Glucose-Capillary: 126 mg/dL — ABNORMAL HIGH (ref 70–99)
Glucose-Capillary: 150 mg/dL — ABNORMAL HIGH (ref 70–99)
Glucose-Capillary: 120 mg/dL — ABNORMAL HIGH (ref 70–99)

## 2023-05-05 LAB — MAGNESIUM: Magnesium: 2.1 mg/dL (ref 1.7–2.4)

## 2023-05-05 MED ORDER — SULFAMETHOXAZOLE-TRIMETHOPRIM 800-160 MG PO TABS: 1.0000 | ORAL_TABLET | Freq: Two times a day (BID) | ORAL | Status: DC

## 2023-05-05 MED ORDER — FLORANEX PO PACK: 1.0000 g | PACK | Freq: Three times a day (TID) | ORAL | 0 refills | Status: AC

## 2023-05-05 MED ORDER — AMOXICILLIN-POT CLAVULANATE 875-125 MG PO TABS: 1.0000 | ORAL_TABLET | Freq: Two times a day (BID) | ORAL | 0 refills | Status: AC

## 2023-05-05 MED ORDER — AMOXICILLIN-POT CLAVULANATE 875-125 MG PO TABS: 1.0000 | ORAL_TABLET | Freq: Two times a day (BID) | ORAL | Status: DC

## 2023-05-05 MED ORDER — SULFAMETHOXAZOLE-TRIMETHOPRIM 800-160 MG PO TABS
1.0000 | ORAL_TABLET | Freq: Two times a day (BID) | ORAL | 0 refills | Status: AC
Start: 2023-05-05 — End: 2023-05-19

## 2023-05-05 NOTE — Progress Notes (Signed)
 Reviewed AVS, patient expressed understanding of medications, MD follow up reviewed.   Removed IV, Site clean, dry and intact.  Pt transported to entrance A.

## 2023-05-05 NOTE — Discharge Summary (Signed)
 Physician Discharge Summary  Bradley Torres FMW:[id number] DOB: 01-Aug-1960 DOA: 04/27/2023  PCP: Zollie Lowers, MD  Admit date: 04/27/2023 Discharge date: 05/05/2023  Admitted From: Home Discharge disposition: Home  Recommendations at discharge:  Complete 3 weeks course of antibiotics with oral Augmentin  and Bactrim , end of treatment 05/19/2023 Follow-up with IR as an outpatient Drain care per IR recommendation. Patient will need to flush drain QD with 5 cc NS, record output QD, dressing changes every 2-3 days or earlier if soiled.  Your blood pressure has remained normal without previous meds.  Continue to hold those medicines.  Monitor blood pressure at home and resume gradually under the supervision of your primary doctor.   Brief narrative: Bradley Torres is a 63 y.o. male with PMH significant for DM2, HTN, HLD, morbid obesity, OSA, CAD pulmonary hypertension, chronic CHF, stroke back pain. 1/30, patient presented to the ED with an abdominal wall with feculent/purulent drainage from the site of prior drainage catheter. In June 2024 patient had a diverticular abscess.  He was managed with a drain and subsequently developed fistula.  He continued to have chronically draining fistula and also had significant weight loss in the interval.  Imaging showed abdominal wall abscess. Blood culture collected Started on empiric antibiotics General Surgery, IR and ID were consulted  Subjective: Patient was seen and examined this morning. Pleasant middle-aged Caucasian male.  Propped up in bed.  Not in distress. Was ready to go home. Lives at home alone.  Able to ambulate independently.  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  Plan is to augment his nutrition arrange outpatient with general surgery for surgical intervention. Currently on IV antibiotic treatment with IV Rocephin , IV Flagyl . Per ID recommendation, antibiotic switched to Bactrim  and Augmentin  due to complete 3 weeks course, EOT 2/21 Continue drain management per IR.  AKI on CKD stage IIIa Patient's baseline creatinine is about 1.4.  Patient with a BUN/creatinine of 33/2.2 respectively on admission.  CT on admission without evidence of hydronephrosis.  Creatinine peak of 2.40. Improved with IV fluids. Creatinine down to 1.54, near baseline. Recent Labs    10/05/22 0403 10/06/22 0419 02/21/23 1544 04/27/23 1810 04/28/23 0102 04/29/23 0543 04/30/23 0921 05/01/23 0521 05/03/23 0641 05/05/23 0517  BUN 17 17 18  33* 33* 33* 21 17 18 19   CREATININE 1.46* 1.45* 1.47* 2.24* 2.40* 2.35* 1.63* 1.54* 1.46* 1.47*   Paroxysmal atrial fibrillation Currently in sinus rhythm.   Continue amiodarone   Chronically anticoagulated with Xarelto     Chronic heart failure with mildly reduced ejection fraction Stable.  Last LVEF of 50 to 55% with associated moderately reduced RV systolic function.  Patient appears euvolemic at this time.   H/o CVA H/o CAD HLD Continue Xarelto  and Lipitor    Gout Continue allopurinol    Essential hypertension Patient is on hydralazine , chlorthalidone , valsartan  as an outpatient.   Antihypertensives were held on admission because of low blood pressure. Blood pressure has improved.  However medication remain on hold.  I would not resume them at discharge.  Type 2 diabetes mellitus A1c 6.5 on 04/10/2023 PTA meds-metformin , Januvia , Farxiga  Okay to resume postdischarge  H/o DVT/PE 2019 Continue Xarelto   Morbid Obesity  Body mass index is 45.85 kg/m. Patient has been advised to make an  attempt to improve diet and exercise patterns to aid in weight loss.   OSA/OHS Continue  CPAP nightly   Goals of care   Code Status: Full Code   Diet:  Diet Order             Diet general           Diet heart healthy/carb modified Room service appropriate? Yes; Fluid consistency: Thin  Diet effective now                   Nutritional status:  Body mass index is 45.85 kg/m.       Wounds:  - Wound / Incision (Open or Dehisced) 04/30/23 Incision - Open Abdomen Medial;Upper wound leaking purulent drainage (Active)  Date First Assessed/Time First Assessed: 04/30/23 0800   Wound Type: Incision - Open  Location: Abdomen  Location Orientation: Medial;Upper  Wound Description (Comments): wound leaking purulent drainage  Present on Admission: Yes    Assessments 04/29/2023 10:30 PM 05/04/2023  9:50 PM  Dressing Type Gauze (Comment);Abdominal pads Other (Comment)  Dressing Changed Changed Other (Comment)  Dressing Status Clean, Dry, Intact Clean, Dry, Intact  Dressing Change Frequency Daily --  Site / Wound Assessment Red Dressing in place / Unable to assess  Closure -- None  Drainage Description -- Landy;Odor - foul     No associated orders.    Discharge Exam:   Vitals:   05/04/23 1442 05/04/23 2024 05/05/23 0442 05/05/23 0824  BP: 120/72 125/71 133/72 126/62  Pulse: 64 71 64 72  Resp: 20 16 20 19   Temp: 98.1 F (36.7 C) 98.4 F (36.9 C) 97.6 F (36.4 C) 98.2 F (36.8 C)  TempSrc: Oral Oral Oral Oral  SpO2: 96% 96% 94% 95%  Weight:      Height:        Body mass index is 45.85 kg/m.  General exam: Pleasant, middle-aged Caucasian male Skin: No rashes, lesions or ulcers. HEENT: Atraumatic, normocephalic, no obvious bleeding Lungs: Clear to auscultation bilaterally,  CVS: S1, S2, no murmur,   GI/Abd: Soft, nontender, distended from obesity, bowel sound present, left lower quadrant with fistula drainage collection bag CNS: Alert, awake, oriented x 3 Psychiatry: Mood appropriate,  Extremities: No pedal edema, no calf tenderness,   Follow ups:     Follow-up Information     Branch, Dorn FALCON, MD Follow up.   Specialty: Cardiology Why: 06/01/2023 at 3:40 PM Contact information: [address] [place] [address] [phone]         Teresa Lonni HERO, MD. Go on 06/14/2023.   Specialties: General Surgery, Colon and Rectal Surgery Why: 10:40 AM, please arrive 30 min prior to appointment time to check in. Contact information: [address] [place] [address] [phone]         Alona Corners, DO Follow up.   Specialties: Interventional Radiology, Radiology Why: For follow up of your drain and a repeat CT scan before your appointment with Dr. Teresa. Contact information: [address] [place] [address] [phone]         Zollie Lowers, MD Follow up.   Specialty: Family Medicine Contact information: [address] [place] [address] [phone]                 Discharge Instructions:   Discharge Instructions     Amb Referral to [facility]omy [facility]   Complete by: As directed    Reason for referral modifiers: Counseling,  education and care for feeding/drainage tubes or fistulae   Call MD for:  difficulty breathing, headache or visual disturbances   Complete by: As directed    Call MD for:  extreme fatigue   Complete by: As directed    Call MD for:  hives   Complete by: As directed    Call MD for:  persistant dizziness or light-headedness   Complete by: As directed    Call MD for:  persistant nausea and vomiting   Complete by: As directed    Call MD for:  severe uncontrolled pain   Complete by: As directed    Call MD for:  temperature >100.4   Complete by: As directed    Diet general   Complete by: As directed    Discharge instructions   Complete by: As directed    Recommendations at discharge:   Complete 3 weeks course of antibiotics with oral Augmentin  and Bactrim , end of treatment 05/19/2023  Follow-up with IR as an outpatient  Drain care  per IR recommendation. Patient will need to flush drain QD with 5 cc NS, record output QD, dressing changes every 2-3 days or earlier if soiled.   Your blood pressure has remained normal without previous meds.  Continue to hold those medicines.  Monitor blood pressure at home and resume gradually under the supervision of your primary doctor.  Discharge instructions for diabetes mellitus: Check blood sugar 3 times a day and bedtime at home. If blood sugar running above 200 or less than 70 please call your MD to adjust insulin . If you notice signs and symptoms of hypoglycemia (low blood sugar) like jitteriness, confusion, thirst, tremor and sweating, please check blood sugar, drink sugary drink/biscuits/sweets to increase sugar level and call MD or return to ER.      General discharge instructions: Follow with Primary MD Zollie Lowers, MD in 7 days  Please request your PCP  to go over your hospital tests, procedures, radiology results at the follow up. Please get your medicines reviewed and adjusted.  Your PCP may decide to repeat certain labs or tests as needed. Do not drive, operate heavy machinery, perform activities at heights, swimming or participation in water  activities or provide baby sitting services if your were admitted for syncope or siezures until you have seen by Primary MD or a Neurologist and advised to do so again. [place]  Controlled Substance Reporting System database was reviewed. Do not drive, operate heavy machinery, perform activities at heights, swim, participate in water  activities or provide baby-sitting services while on medications for pain, sleep and mood until your outpatient physician has reevaluated you and advised to do so again.  You are strongly recommended to comply with the dose, frequency and duration of prescribed medications. Activity: As tolerated with Full fall precautions use walker/cane & assistance as needed Avoid using any recreational substances  like cigarette, tobacco, alcohol, or non-prescribed drug. If you experience worsening of your admission symptoms, develop shortness of breath, life threatening emergency, suicidal or homicidal thoughts you must seek medical attention immediately by calling 911 or calling your MD immediately  if symptoms less severe. You must read complete instructions/literature along with all the possible adverse reactions/side effects for all the medicines you take and that have been prescribed to you. Take any new medicine only after you have completely understood and accepted all the possible adverse reactions/side effects.  Wear Seat belts while driving. You were cared for by a hospitalist during your hospital stay. If you have  any questions about your discharge medications or the care you received while you were in the hospital after you are discharged, you can call the unit and ask to speak with the hospitalist or the covering physician. Once you are discharged, your primary care physician will handle any further medical issues. Please note that NO REFILLS for any discharge medications will be authorized once you are discharged, as it is imperative that you return to your primary care physician (or establish a relationship with a primary care physician if you do not have one).   Discharge wound care:   Complete by: As directed    Increase activity slowly   Complete by: As directed        Discharge Medications:   Allergies as of 05/05/2023       Reactions   Aldactone  [spironolactone ] Hives   Bystolic  [nebivolol  Hcl] Swelling   Motrin [ibuprofen] Other (See Comments)   Rectal bleed   Actos  [pioglitazone ] Other (See Comments), Cough   Dyspnea  Edema   Trulicity  [dulaglutide ] Other (See Comments), Cough   Shortness of breath GI intolerance   Zestril  [lisinopril ] Other (See Comments), Cough   Wheezing    Cozaar  [losartan ] Rash   Diflucan  [fluconazole ] Rash   Hctz [hydrochlorothiazide ] Other (See  Comments)   Gout flares.   Norvasc  [amlodipine ] Itching, Other (See Comments)   Myalgias         Medication List     STOP taking these medications    chlorthalidone  25 MG tablet Commonly known as: HYGROTON    hydrALAZINE  25 MG tablet Commonly known as: APRESOLINE    valsartan  160 MG tablet Commonly known as: DIOVAN        TAKE these medications    Accu-Chek Guide test strip Generic drug: glucose blood Use as instructed to monitor glucose twice daily   Accu-Chek Softclix Lancets lancets Use as instructed to monitor glucose twice daily   allopurinol  100 MG tablet Commonly known as: ZYLOPRIM  TAKE ONE TABLET BY MOUTH ONCE DAILY   amiodarone  200 MG tablet Commonly known as: PACERONE  Take 1 tablet (200 mg total) by mouth daily.   amoxicillin -clavulanate 875-125 MG tablet Commonly known as: AUGMENTIN  Take 1 tablet by mouth every 12 (twelve) hours for 14 days.   atorvastatin  20 MG tablet Commonly known as: LIPITOR  TAKE ONE TABLET BY MOUTH ONCE DAILY   dapagliflozin  propanediol 10 MG Tabs tablet Commonly known as: FARXIGA  TAKE ONE TABLET BY MOUTH DAILY BEFORE BREAKFAST   DHEA PO Take 1 capsule by mouth daily.   docusate sodium  100 MG capsule Commonly known as: COLACE Take 1 capsule (100 mg total) by mouth 2 (two) times daily. What changed:  when to take this reasons to take this   Januvia  100 MG tablet Generic drug: sitaGLIPtin  TAKE ONE TABLET BY MOUTH ONCE DAILY   lactobacillus Pack Take 1 packet (1 g total) by mouth 3 (three) times daily with meals for 14 days.   metFORMIN  750 MG 24 hr tablet Commonly known as: GLUCOPHAGE -XR Take 2 tablets (1,500 mg total) by mouth daily with breakfast. What changed: how much to take   oxyCODONE -acetaminophen  10-325 MG tablet Commonly known as: PERCOCET Take 1 tablet by mouth 3 (three) times daily as needed for pain. What changed: when to take this   polyethylene glycol 17 g packet Commonly known as: MIRALAX  /  GLYCOLAX  Take 17 g by mouth daily. What changed:  when to take this reasons to take this   senna-docusate 8.6-50 MG tablet Commonly known as: Senokot-S  Take 2 tablets by mouth at bedtime. What changed:  when to take this reasons to take this   sulfamethoxazole -trimethoprim  800-160 MG tablet Commonly known as: BACTRIM  DS Take 1 tablet by mouth every 12 (twelve) hours for 14 days.   VITAMIN D -3 PO Take 1 tablet by mouth daily.   Xarelto  20 MG Tabs tablet Generic drug: rivaroxaban  TAKE ONE TABLET BY MOUTH DAILY WITH SUPPER               Discharge Care Instructions  (From admission, onward)           Start     Ordered   05/05/23 0000  Discharge wound care:        05/05/23 1441             The results of significant diagnostics from this hospitalization (including imaging, microbiology, ancillary and laboratory) are listed below for reference.    Procedures and Diagnostic Studies:   CT GUIDED PERITONEAL/RETROPERITONEAL FLUID DRAIN BY PERC CATH Result Date: 04/28/2023 INDICATION: 63 year old male with a history of diverticular abscess, prior drainage. He presents emergency department with drainage through a skin tract. CT demonstrates fluid collection adjacent to the colon concerning for recurrent abscess. In addition he was small fluid collection in the anterior abdominal wall. EXAM: CT-GUIDED DRAINAGE OF INTRA-ABDOMINAL ABSCESS TECHNIQUE: Multidetector CT imaging of the abdomen was performed following the standard protocol without IV contrast. RADIATION DOSE REDUCTION: This exam was performed according to the departmental dose-optimization program which includes automated exposure control, adjustment of the mA and/or kV according to patient size and/or use of iterative reconstruction technique. MEDICATIONS: The patient is currently admitted to the hospital and receiving intravenous antibiotics. The antibiotics were administered within an appropriate time frame prior  to the initiation of the procedure. ANESTHESIA/SEDATION: Moderate (conscious) sedation was employed during this procedure. A total of Versed  1.5 mg and Fentanyl  75 mcg was administered intravenously by the radiology nurse. Total intra-service moderate Sedation Time: 24 minutes. The patient's level of consciousness and vital signs were monitored continuously by radiology nursing throughout the procedure under my direct supervision. COMPLICATIONS: None PROCEDURE: Informed written consent was obtained from the patient after a thorough discussion of the procedural risks, benefits and alternatives. All questions were addressed. Maximal Sterile Barrier Technique was utilized including caps, mask, sterile gowns, sterile gloves, sterile drape, hand hygiene and skin antiseptic. A timeout was performed prior to the initiation of the procedure. Patient was positioned supine on the CT gantry table. Scout CT acquired for planning purposes. The patient is prepped and draped in the usual sterile fashion. One sent lidocaine  was used for local anesthesia. Using CT guidance, a trocar needle was advanced from an anterior approach through the fluid collection of the anterior abdominal wall in into the pericolonic abscess. At the same time, an oblique approach with a Yueh needle was used to approach the anterior abdominal wall fluid collection. During the course of the study, the majority of the fluid in the anterior abdominal wall abscess decompressed itself through the lesion in the skin. By the completion, there was no significant fluid 4 attempted drainage placement or aspiration in the anterior abdominal wall. Once the trocar needle was in position within the abdominal abscess, modified Seldinger technique was used to place a 10 French drain. A proximally 30 cc of frankly purulent material aspirated. Final CT was acquired demonstrating decreased fluid in the anterior abdominal wall, decreased fluid in the abscess in the abdomen.  The drain was sutured in position  attached to bulb suction. Patient tolerated the procedure well and remained hemodynamically stable throughout. No complications were encountered and no significant blood loss. IMPRESSION: Status post CT-guided drainage of abdominal abscess adjacent to the left colon. During the course of the exam the majority of fluid in the anterior abdominal wall collection decompressed through the skin. No drain was attempted at this location. Signed, Ami RAMAN. Alona ROSALEA GRAVER, RPVI [facility] Electronically Signed   By: Ami Alona D.O.   On: 04/28/2023 15:57   CT ABDOMEN PELVIS W CONTRAST Result Date: 04/27/2023 CLINICAL DATA:  Diverticulitis, complication suspected known fisula and abscess Patient reports increased drainage from left lower quadrant operative site. Drain removed 3 months ago. EXAM: CT ABDOMEN AND PELVIS WITH CONTRAST TECHNIQUE: Multidetector CT imaging of the abdomen and pelvis was performed using the standard protocol following bolus administration of intravenous contrast. RADIATION DOSE REDUCTION: This exam was performed according to the departmental dose-optimization program which includes automated exposure control, adjustment of the mA and/or kV according to patient size and/or use of iterative reconstruction technique. CONTRAST:  75mL OMNIPAQUE  IOHEXOL  350 MG/ML SOLN COMPARISON:  CT 10/26/2022 FINDINGS: Lower chest: No basilar airspace disease or pleural effusion. Hepatobiliary: Scattered hepatic hypodensities are too small to characterize but unchanged from prior exam, likely cysts. Moderate gallbladder distension. Small calcified gallstones. No pericholecystic inflammation. No biliary dilatation. Pancreas: No ductal dilatation or inflammation. Spleen: Normal in size without focal abnormality.  Small splenule. Adrenals/Urinary Tract: Normal adrenal glands. No hydronephrosis or perinephric edema. Homogeneous  renal enhancement. Lobulated renal contours. Unchanged right renal cyst. No further follow-up imaging is recommended. Urinary bladder is physiologically distended without wall thickening. Stomach/Bowel: Unremarkable appearance of the stomach. No small bowel obstruction or inflammation. The appendix is normal. Colonic diverticulosis without acute diverticulitis. Vascular/Lymphatic: Aortic atherosclerosis. No aneurysm. The portal vein is patent. Prominent mesenteric nodes measuring up to 10 mm. This is nonspecific. Reproductive: Prostate is unremarkable. Other: At site of prior drainage catheter there is a left lower quadrant tract extending from the skin surface to the abdominal wall. In the deep subcutaneous tissues is a area of soft tissue density with internal air measuring 4.3 x 3.6 cm. Only minimal internal fluid. Surrounding fat stranding and inflammation. This does not definitively connect to the intra-abdominal contents or subjacent bowel. No free air or ascites. Musculoskeletal: L4-L5 fusion hardware. There are no acute or suspicious osseous abnormalities. IMPRESSION: 1. Heterogeneous collection in the left abdominal wall at site of prior drainage catheter. Tract extends from the skin surface to the abdominal wall. In the deep subcutaneous tissues is a area of soft tissue density with internal air measuring 4.3 x 3.6 cm. Only minimal internal fluid. Surrounding fat stranding and inflammation. This does not definitively connect to the intra-abdominal contents or subjacent bowel. 2. Colonic diverticulosis without acute diverticulitis. 3. Cholelithiasis. Electronically Signed   By: Andrea Gasman M.D.   On: 04/27/2023 20:48     Labs:   Basic Metabolic Panel: Recent Labs  Lab 04/29/23 0543 04/30/23 0921 05/01/23 0521 05/03/23 0641 05/05/23 0517  NA 134* 134* 135 134* 136  K 3.5 3.5 3.5 3.4* 4.4  CL 97* 100 99 96* 99  CO2 26 24 26 27 27   GLUCOSE 199* 129* 114* 142* 141*  BUN 33* 21 17 18 19    CREATININE 2.35* 1.63* 1.54* 1.46* 1.47*  CALCIUM  8.3* 8.2* 8.3* 8.7* 8.9  MG  --   --   --   --  2.1  GFR Estimated Creatinine Clearance: 80.5 mL/min (A) (by C-G formula based on SCr of 1.47 mg/dL (H)). Liver Function Tests: Recent Labs  Lab 05/01/23 0521  AST 13*  ALT 13  ALKPHOS 67  BILITOT 0.5  PROT 6.4*  ALBUMIN  2.4*   No results for input(s): LIPASE, AMYLASE in the last 168 hours. No results for input(s): AMMONIA in the last 168 hours. Coagulation profile No results for input(s): INR, PROTIME in the last 168 hours.  CBC: Recent Labs  Lab 04/29/23 0543 04/30/23 1009 05/01/23 0521 05/03/23 0641  WBC 7.3 8.8 9.2 9.4  HGB 10.6* 10.5* 10.5* 10.5*  HCT 32.3* 31.3* 32.0* 31.6*  MCV 94.7 92.3 93.0 92.4  PLT 284 276 305 364   Cardiac Enzymes: No results for input(s): CKTOTAL, CKMB, CKMBINDEX, TROPONINI in the last 168 hours. BNP: Invalid input(s): POCBNP CBG: Recent Labs  Lab 05/04/23 1228 05/04/23 1723 05/04/23 2145 05/05/23 0819 05/05/23 1247  GLUCAP 202* 121* 179* 126* 150*   D-Dimer No results for input(s): DDIMER in the last 72 hours. Hgb A1c No results for input(s): HGBA1C in the last 72 hours. Lipid Profile No results for input(s): CHOL, HDL, LDLCALC, TRIG, CHOLHDL, LDLDIRECT in the last 72 hours. Thyroid  function studies No results for input(s): TSH, T4TOTAL, T3FREE, THYROIDAB in the last 72 hours.  Invalid input(s): FREET3 Anemia work up No results for input(s): VITAMINB12, FOLATE, FERRITIN, TIBC, IRON, RETICCTPCT in the last 72 hours. Microbiology Recent Results (from the past 240 hours)  Blood culture (routine x 2)     Status: None   Collection Time: 04/27/23  5:10 PM   Specimen: BLOOD  Result Value Ref Range Status   Specimen Description BLOOD SITE NOT SPECIFIED  Final   Special Requests   Final    BOTTLES DRAWN AEROBIC AND ANAEROBIC Blood Culture results may not be optimal due  to an inadequate volume of blood received in culture bottles   Culture   Final    NO GROWTH 5 DAYS Performed at [facility], [address], [place], [place] [address]    Report Status 05/02/2023 FINAL  Final  Blood culture (routine x 2)     Status: None   Collection Time: 04/27/23 10:30 PM   Specimen: BLOOD RIGHT HAND  Result Value Ref Range Status   Specimen Description BLOOD RIGHT HAND  Final   Special Requests   Final    BOTTLES DRAWN AEROBIC AND ANAEROBIC Blood Culture results may not be optimal due to an inadequate volume of blood received in culture bottles   Culture   Final    NO GROWTH 5 DAYS Performed at [facility], [address], [place], [place] [address]    Report Status 05/02/2023 FINAL  Final  Aerobic/Anaerobic Culture w Gram Stain (surgical/deep wound)     Status: None (Preliminary result)   Collection Time: 04/28/23 12:02 PM   Specimen: Abdomen; Abscess  Result Value Ref Range Status   Specimen Description ABDOMEN  Final   Special Requests ABSCESS  Final   Gram Stain   Final    FEW WBC PRESENT, PREDOMINANTLY PMN ABUNDANT GRAM NEGATIVE RODS MODERATE GRAM POSITIVE COCCI IN PAIRS IN CLUSTERS FEW YEAST FEW GRAM POSITIVE RODS    Culture   Final    ABUNDANT STREPTOCOCCUS INFANTARIUS ABUNDANT ESCHERICHIA COLI ABUNDANT CLOSTRIDIUM PERFRINGENS    Report Status PENDING  Incomplete   Organism ID, Bacteria ESCHERICHIA COLI  Final   Organism ID, Bacteria STREPTOCOCCUS INFANTARIUS  Final  Organism ID, Bacteria ESCHERICHIA COLI  Final      Susceptibility   Escherichia coli - MIC*    AMPICILLIN >=32 RESISTANT Resistant     CEFEPIME  <=0.12 SENSITIVE Sensitive     CEFTAZIDIME <=1 SENSITIVE Sensitive     CEFTRIAXONE  <=0.25 SENSITIVE Sensitive     CIPROFLOXACIN  1 RESISTANT Resistant     GENTAMICIN <=1 SENSITIVE Sensitive     IMIPENEM <=0.25 SENSITIVE Sensitive     TRIMETH /SULFA  <=20 SENSITIVE Sensitive     AMPICILLIN/SULBACTAM 16 INTERMEDIATE  Intermediate     PIP/TAZO <=4 SENSITIVE Sensitive ug/mL   Escherichia coli - KIRBY BAUER*    CEFAZOLIN Value in next row Intermediate      INTERMEDIATEPerformed at [facility], [address] [place], [place] [address]    * ABUNDANT ESCHERICHIA COLI    ABUNDANT ESCHERICHIA COLI   Streptococcus infantarius - MIC*    PENICILLIN Value in next row Sensitive      INTERMEDIATEPerformed at [facility], [address], [place], [place] [address]    CEFTRIAXONE  Value in next row Sensitive      INTERMEDIATEPerformed at [facility], [address], [place], [place] [address]    ERYTHROMYCIN Value in next row Resistant      INTERMEDIATEPerformed at [facility], [address] [place], [place] [address]    LEVOFLOXACIN Value in next row Intermediate      INTERMEDIATEPerformed at [facility], [address], [place], [place] [address]    VANCOMYCIN Value in next row Sensitive      INTERMEDIATEPerformed at [facility], [address], [place], [place] [address]    * ABUNDANT STREPTOCOCCUS INFANTARIUS    Time coordinating discharge: 45 minutes  Signed: Paula Busenbark  [facility] 05/05/2023, 2:41 PM

## 2023-05-05 NOTE — TOC Progression Note (Signed)
 Transition of Care (TOC) - Progression Note   Nurse has provided extra Eakin pouches to patient .  Patient Details  Name: Bradley Torres MRN: [id number] Date of Birth: 1960/11/12  Transition of Care [facility]) CM/SW Contact  Jamila Slatten, Powell Jansky, RN Phone Number: 05/05/2023, 3:39 PM  Clinical Narrative:       Expected Discharge Plan: Home/Self Care Barriers to Discharge: Continued Medical Work up  Expected Discharge Plan and Services   Discharge Planning Services: CM Consult Post Acute Care Choice: Home Health Living arrangements for the past 2 months: Single Family Home Expected Discharge Date: 05/05/23               DME Arranged: N/A         HH Arranged: Patient Refused HH           Social Determinants of Health (SDOH) Interventions SDOH Screenings   Food Insecurity: No Food Insecurity (04/28/2023)  Housing: Low Risk  (04/28/2023)  Transportation Needs: No Transportation Needs (04/28/2023)  Utilities: Not At Risk (04/28/2023)  Depression (PHQ2-9): Low Risk  (02/21/2023)  Financial Resource Strain: Medium Risk (05/18/2020)  Social Connections: Unknown (01/16/2020)  Stress: Stress Concern Present (05/15/2019)  Tobacco Use: Low Risk  (04/27/2023)    Readmission Risk Interventions     No data to display

## 2023-05-05 NOTE — Consult Note (Signed)
 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   Wound type: infectious/fistula  Measurement: opening in midline abdominal incision:  1 cm x 1 cm round, flush stomatized fistula.  Feculent effluent draining from stoma and noted in perc drain to LLQ. The insert site is draining a moderate amount of pus, damaging the eakin pouch and the skin surrounding.  Wound bed: pink moist stoma Drainage (amount, consistency, odor) moderate thick brown/yellow feculent effluent  pungent odor.  Sent it a secure chat to the PA about the leaking and reassess the drain.  Pt is able to use the small eakin pouch (lawson#[id number]) Check the availability.  Dressing procedure/placement/frequency: Topical treatment orders provided for patient and bedside nurses to perform: 1. Cleanse skin around fistula and perc drain with soap and water . 2. Apply skin prep 3. Apply barrier ring Soila # [phone]). 4. Cut medium Eakin pouch (LAWSON # [id number]) off center to accommodate perc drain. Pattern left at bedside. 5. If stool is too thick for spout, then cut off spout and apply pouch clip. Soila # [phone])   WOC team will follow on MON.   Please reconsult if further assistance is needed. Thank-you,  Lela Holm BSN, RN, [employer], WOC  (Pager: [phone])

## 2023-05-05 NOTE — Progress Notes (Signed)
 ID brief note   Afebrile  Results for orders placed or performed during the hospital encounter of 04/27/23  Blood culture (routine x 2)     Status: None   Collection Time: 04/27/23  5:10 PM   Specimen: BLOOD  Result Value Ref Range Status   Specimen Description BLOOD SITE NOT SPECIFIED  Final   Special Requests   Final    BOTTLES DRAWN AEROBIC AND ANAEROBIC Blood Culture results may not be optimal due to an inadequate volume of blood received in culture bottles   Culture   Final    NO GROWTH 5 DAYS Performed at [facility], [address], [place], [place] [address]    Report Status 05/02/2023 FINAL  Final  Blood culture (routine x 2)     Status: None   Collection Time: 04/27/23 10:30 PM   Specimen: BLOOD RIGHT HAND  Result Value Ref Range Status   Specimen Description BLOOD RIGHT HAND  Final   Special Requests   Final    BOTTLES DRAWN AEROBIC AND ANAEROBIC Blood Culture results may not be optimal due to an inadequate volume of blood received in culture bottles   Culture   Final    NO GROWTH 5 DAYS Performed at [facility], [address], [place], [place] [address]    Report Status 05/02/2023 FINAL  Final  Aerobic/Anaerobic Culture w Gram Stain (surgical/deep wound)     Status: None (Preliminary result)   Collection Time: 04/28/23 12:02 PM   Specimen: Abdomen; Abscess  Result Value Ref Range Status   Specimen Description ABDOMEN  Final   Special Requests ABSCESS  Final   Gram Stain   Final    FEW WBC PRESENT, PREDOMINANTLY PMN ABUNDANT GRAM NEGATIVE RODS MODERATE GRAM POSITIVE COCCI IN PAIRS IN CLUSTERS FEW YEAST FEW GRAM POSITIVE RODS    Culture   Final    ABUNDANT STREPTOCOCCUS INFANTARIUS ABUNDANT ESCHERICHIA COLI ABUNDANT CLOSTRIDIUM PERFRINGENS    Report Status PENDING  Incomplete   Organism ID, Bacteria ESCHERICHIA COLI  Final   Organism ID, Bacteria STREPTOCOCCUS INFANTARIUS  Final   Organism ID, Bacteria ESCHERICHIA COLI  Final       Susceptibility   Escherichia coli - MIC*    AMPICILLIN >=32 RESISTANT Resistant     CEFEPIME  <=0.12 SENSITIVE Sensitive     CEFTAZIDIME <=1 SENSITIVE Sensitive     CEFTRIAXONE  <=0.25 SENSITIVE Sensitive     CIPROFLOXACIN  1 RESISTANT Resistant     GENTAMICIN <=1 SENSITIVE Sensitive     IMIPENEM <=0.25 SENSITIVE Sensitive     TRIMETH /SULFA  <=20 SENSITIVE Sensitive     AMPICILLIN/SULBACTAM 16 INTERMEDIATE Intermediate     PIP/TAZO <=4 SENSITIVE Sensitive ug/mL   Escherichia coli - KIRBY BAUER*    CEFAZOLIN Value in next row Intermediate      INTERMEDIATEPerformed at [facility], [address] [place], [place] [address]    * ABUNDANT ESCHERICHIA COLI    ABUNDANT ESCHERICHIA COLI   Streptococcus infantarius - MIC*    PENICILLIN Value in next row Sensitive      INTERMEDIATEPerformed at [facility], [address], [place], [place] [address]    CEFTRIAXONE  Value in next row Sensitive      INTERMEDIATEPerformed at [facility], [address] [place], [place] [address]    ERYTHROMYCIN Value in next row Resistant      INTERMEDIATEPerformed at [facility], [address], [place], [place] [address]  LEVOFLOXACIN Value in next row Intermediate      INTERMEDIATEPerformed at [facility], [address], [name], [place] [address]    VANCOMYCIN Value in next row Sensitive      INTERMEDIATEPerformed at [facility], [address], [place], [place] [address]    * ABUNDANT STREPTOCOCCUS INFANTARIUS   OK to switch to PO bactrim  and augmentin  on discharge to complete 3 weeks course. EOT 2/21.  Defer fluoroquinolones.  Further duration of antibiotics can be determined outpatient depending upon IR and surgical plans for definitive surgery for his fistula  Drain management per IR  Patient has a follow-up arranged on 2/13 at 9 am at [facility] with Dr. Overton.  Plan to check on BMP.   ID will so, please call with questions, concerns or any changes  Annalee Orem,  MD Infectious Disease Physician [facility] [address] [place], [place] [address] Phone: [phone]  Fax: [phone]

## 2023-05-05 NOTE — Progress Notes (Signed)
 Ky Phillips, RN performed wound care and dressing change this morning. Pt refused to have the tube holder change which stabilizes the jp drain. He stated, "it is fine since the dressing has been changed."

## 2023-05-05 NOTE — Plan of Care (Signed)
  Problem: Fluid Volume: Goal: Ability to maintain a balanced intake and output will improve Outcome: Progressing   Problem: Metabolic: Goal: Ability to maintain appropriate glucose levels will improve Outcome: Progressing   Problem: Nutritional: Goal: Progress toward achieving an optimal weight will improve Outcome: Progressing   Problem: Tissue Perfusion: Goal: Adequacy of tissue perfusion will improve Outcome: Progressing   Problem: Clinical Measurements: Goal: Will remain free from infection Outcome: Progressing   Problem: Nutrition: Goal: Adequate nutrition will be maintained Outcome: Progressing

## 2023-05-05 NOTE — Progress Notes (Signed)
 Referring Provider(s): L. Kinsinger   Supervising Physician: Hughes Simmonds  Patient Status:  [facility] - In-pt  Chief Complaint:  Diverticular Abscess  Colocutaneous Fistula   Brief History:  Previous diverticular abscess with prior drainage.  Presented to ED with skin tract drainage.  IR placed new drain LLQ on 04/28/23.  Subjective:  Pt states he is feeling well today and that he is ready to go home   Allergies: Aldactone  [spironolactone ], Bystolic  [nebivolol  hcl], Motrin [ibuprofen], Actos  [pioglitazone ], Trulicity  [dulaglutide ], Zestril  [lisinopril ], Cozaar  [losartan ], Diflucan  [fluconazole ], Hctz [hydrochlorothiazide ], and Norvasc  [amlodipine ]  Medications: Prior to Admission medications   Medication Sig Start Date End Date Taking? Authorizing Provider  allopurinol  (ZYLOPRIM ) 100 MG tablet TAKE ONE TABLET BY MOUTH ONCE DAILY 04/04/23  Yes Stacks, Lanes, MD  amiodarone  (PACERONE ) 200 MG tablet Take 1 tablet (200 mg total) by mouth daily. 01/12/23  Yes BranchDorn FALCON, MD  atorvastatin  (LIPITOR ) 20 MG tablet TAKE ONE TABLET BY MOUTH ONCE DAILY 08/01/22  Yes Branch, Dorn FALCON, MD  chlorthalidone  (HYGROTON ) 25 MG tablet TAKE 1 TABLET BY MOUTH EVERY OTHER DAY 01/20/23  Yes Miriam Norris, NP  Cholecalciferol (VITAMIN D -3 PO) Take 1 tablet by mouth daily.   Yes [provider]  dapagliflozin  propanediol (FARXIGA ) 10 MG TABS tablet TAKE ONE TABLET BY MOUTH DAILY BEFORE BREAKFAST 04/05/23  Yes Stacks, Martinezgarcia, MD  docusate sodium  (COLACE) 100 MG capsule Take 1 capsule (100 mg total) by mouth 2 (two) times daily. Patient taking differently: Take 100 mg by mouth 2 (two) times daily as needed (constipation.). 06/28/22  Yes Krishnan, Gokul, MD  hydrALAZINE  (APRESOLINE ) 25 MG tablet Take 1 tablet (25 mg total) by mouth daily. 09/08/22  Yes Miriam Norris, NP  JANUVIA  100 MG tablet TAKE ONE TABLET BY MOUTH ONCE DAILY 04/05/23  Yes Zollie Fantini, MD  metFORMIN  (GLUCOPHAGE -XR) 750 MG 24  hr tablet Take 2 tablets (1,500 mg total) by mouth daily with breakfast. Patient taking differently: Take 750 mg by mouth daily with breakfast. 05/11/22  Yes Zollie Bouley, MD  oxyCODONE -acetaminophen  (PERCOCET) 10-325 MG tablet Take 1 tablet by mouth 3 (three) times daily as needed for pain. Patient taking differently: Take 1 tablet by mouth every 6 (six) hours as needed for pain. 06/28/22  Yes Krishnan, Gokul, MD  polyethylene glycol (MIRALAX  / GLYCOLAX ) 17 g packet Take 17 g by mouth daily. Patient taking differently: Take 17 g by mouth daily as needed (constipation.). 06/28/22  Yes Krishnan, Gokul, MD  Prasterone, DHEA, (DHEA PO) Take 1 capsule by mouth daily.   Yes [provider]  rivaroxaban  (XARELTO ) 20 MG TABS tablet TAKE ONE TABLET BY MOUTH DAILY WITH SUPPER 03/20/23  Yes Stacks, Rock, MD  senna-docusate (SENOKOT-S) 8.6-50 MG tablet Take 2 tablets by mouth at bedtime. Patient taking differently: Take 2 tablets by mouth at bedtime as needed (constipation). 06/28/22  Yes Krishnan, Gokul, MD  valsartan  (DIOVAN ) 160 MG tablet TAKE 1 TABLET BY MOUTH EVERY DAY 03/15/23  Yes Zollie Colden, MD  Accu-Chek Softclix Lancets lancets Use as instructed to monitor glucose twice daily 09/23/21   Therisa Benton PARAS, NP  glucose blood (ACCU-CHEK GUIDE) test strip Use as instructed to monitor glucose twice daily 10/28/21   Therisa Benton PARAS, NP     Vital Signs: BP 126/62 (BP Location: Right Arm)   Pulse 72   Temp 98.2 F (36.8 C) (Oral)   Resp 19   Ht 6' (1.829 m)   Wt (!) 338 lb 1.6 oz (153.4 kg)  SpO2 95%   BMI 45.85 kg/m   Physical Exam Skin:    Comments: Drain site dry, no leakage around drain site  No signs of infection  Feculent odor and drainage output in JP       Labs:  CBC: Recent Labs    04/29/23 0543 04/30/23 1009 05/01/23 0521 05/03/23 0641  WBC 7.3 8.8 9.2 9.4  HGB 10.6* 10.5* 10.5* 10.5*  HCT 32.3* 31.3* 32.0* 31.6*  PLT 284 276 305 364    COAGS: No  results for input(s): INR, APTT in the last 8760 hours.  BMP: Recent Labs    04/30/23 0921 05/01/23 0521 05/03/23 0641 05/05/23 0517  NA 134* 135 134* 136  K 3.5 3.5 3.4* 4.4  CL 100 99 96* 99  CO2 24 26 27 27   GLUCOSE 129* 114* 142* 141*  BUN 21 17 18 19   CALCIUM  8.2* 8.3* 8.7* 8.9  CREATININE 1.63* 1.54* 1.46* 1.47*  GFRNONAA 48* 51* 54* 54*    LIVER FUNCTION TESTS: Recent Labs    10/01/22 0829 02/21/23 1544 04/27/23 1810 05/01/23 0521  BILITOT 1.0 0.3 0.7 0.5  AST 13* 11 22 13*  ALT 10 11 30 13   ALKPHOS 56 93 95 67  PROT 7.1 7.1 7.9 6.4*  ALBUMIN  2.9* 3.8* 3.1* 2.4*   Drain Location: LLQ Size: Fr size: 10 Fr Date of placement: 04/28/23  Currently to: Drain collection device: suction bulb 24 hour output:  Output by Drain (mL) 05/03/23 0701 - 05/03/23 1900 05/03/23 1901 - 05/04/23 0700 05/04/23 0701 - 05/04/23 1900 05/04/23 1901 - 05/05/23 0700 05/05/23 0701 - 05/05/23 1411  Closed System Drain Lateral LUQ 10 Fr. 50 5 20 5      Interval imaging/drain manipulation:  none  Current examination: Flushes/aspirates easily.  Insertion site unremarkable. Suture and stat lock in place. Dressed appropriately.   Plan: Continue TID flushes with 5 cc NS. Record output Q shift. Dressing changes QD or PRN if soiled.  Call IR APP or on call IR MD if difficulty flushing or sudden change in drain output.  Repeat imaging/possible drain injection once output < 10 mL/QD (excluding flush material). Consideration for drain removal if output is < 10 mL/QD (excluding flush material), pending discussion with the providing surgical service.  Discharge planning: Please contact IR APP or on call IR MD prior to patient d/c to ensure appropriate follow up plans are in place. Typically patient will follow up with IR clinic 10-14 days post d/c for repeat imaging/possible drain injection. IR scheduler will contact patient with date/time of appointment. Patient will need to flush drain QD  with 5 cc NS, record output QD, dressing changes every 2-3 days or earlier if soiled.   IR will continue to follow - please call with questions or concerns.     Assessment and Plan:  Drain site is clean, no signs of infection, no drainage at drain site.  Pt is to be d/c today 05/05/23.    Pt will have outpatient follow up in 2 weeks.    Please call IR with any questions.    Electronically Signed: Lavanda JAYSON Jurist, PA-C 05/05/2023, 2:07 PM    I spent a total of 15 Minutes at the the patient's bedside AND on the patient's hospital floor or unit, greater than 50% of which was counseling/coordinating care for LLQ abscess drain.

## 2023-05-05 NOTE — Progress Notes (Signed)
 Pt refused miralax  due to having loose bowel movements today and the last 3 days.

## 2023-05-08 ENCOUNTER — Other Ambulatory Visit: Payer: Self-pay | Admitting: General Surgery

## 2023-05-08 ENCOUNTER — Telehealth: Payer: Self-pay | Admitting: *Deleted

## 2023-05-08 DIAGNOSIS — M539 Dorsopathy, unspecified: Secondary | ICD-10-CM | POA: Diagnosis not present

## 2023-05-08 DIAGNOSIS — K651 Peritoneal abscess: Secondary | ICD-10-CM

## 2023-05-08 DIAGNOSIS — Z6841 Body Mass Index (BMI) 40.0 and over, adult: Secondary | ICD-10-CM | POA: Diagnosis not present

## 2023-05-08 DIAGNOSIS — E1165 Type 2 diabetes mellitus with hyperglycemia: Secondary | ICD-10-CM | POA: Diagnosis not present

## 2023-05-08 DIAGNOSIS — Z09 Encounter for follow-up examination after completed treatment for conditions other than malignant neoplasm: Secondary | ICD-10-CM | POA: Diagnosis not present

## 2023-05-08 DIAGNOSIS — Z79899 Other long term (current) drug therapy: Secondary | ICD-10-CM | POA: Diagnosis not present

## 2023-05-08 LAB — AEROBIC/ANAEROBIC CULTURE W GRAM STAIN (SURGICAL/DEEP WOUND)

## 2023-05-08 NOTE — Transitions of Care (Post Inpatient/ED Visit) (Signed)
   05/08/2023  Name: Bradley Torres MRN: [id number] DOB: [birth year]  Today's TOC FU Call Status: Today's TOC FU Call Status:: Unsuccessful Call (1st Attempt) Unsuccessful Call (1st Attempt) Date: 05/08/23  Attempted to reach the patient regarding the most recent Inpatient/ED visit.  Follow Up Plan: Additional outreach attempts will be made to reach the patient to complete the Transitions of Care (Post Inpatient/ED visit) call.   Cecilie Coffee [facility], BSN RN Care Manager/ Transition of Care [place]/ [facility] [phone]

## 2023-05-10 ENCOUNTER — Encounter: Payer: Self-pay | Admitting: *Deleted

## 2023-05-10 ENCOUNTER — Telehealth: Payer: Self-pay | Admitting: *Deleted

## 2023-05-10 NOTE — Transitions of Care (Post Inpatient/ED Visit) (Signed)
05/10/2023  Name: Bradley Torres MRN: [id number] DOB: Oct 05, 1960  Today's TOC FU Call Status: Today's TOC FU Call Status:: Successful TOC FU Call Completed TOC FU Call Complete Date: 05/10/23 Patient's Name and Date of Birth confirmed.  Transition Care Management Follow-up Telephone Call Date of Discharge: 05/05/23 Discharge Facility: [facility]) Type of Discharge: Inpatient Admission Primary Inpatient Discharge Diagnosis:: Abdominal wall abcess How have you been since you were released from the hospital?:  (pt reports eating and drinking well, ambulating without difficulty, managing drain- flushing daily w/ 5cc NSS, changing dressing q 2-3 days, pt is not monitoring BP as inst. states he is not going to do this, pt monitors CBG) Any questions or concerns?: No  Items Reviewed: Did you receive and understand the discharge instructions provided?: Yes Medications obtained,verified, and reconciled?: Yes (Medications Reviewed) Any new allergies since your discharge?: No Dietary orders reviewed?: Yes Type of Diet Ordered:: heart health, carb modified Do you have support at home?: Yes People in Home: alone Name of Support/Comfort Primary Source: brother lives next door-  Bradley Torres- and helps out if needed Patient declines enrollment in [facility] day program, states he will call RN care manager back if he has any concerns or questions Reviewed signs/ symptoms of infection, importance of monitoring blood pressure at home and recording Reinforced care of drain per discharge instructions  Medications Reviewed Today: Medications Reviewed Today     Reviewed by Audrie Gallus, RN (Registered Nurse) on 05/10/23 at 1518  Med List Status: <None>   Medication Order Taking? Sig Documenting Provider Last Dose Status Informant  Accu-Chek Softclix Lancets lancets [id number] Yes Use as instructed to monitor glucose twice daily Dani Gobble, NP Taking Active Self, Pharmacy Records  allopurinol  (ZYLOPRIM) 100 MG tablet [id number] Yes TAKE ONE TABLET BY MOUTH ONCE DAILY Mechele Claude, MD Taking Active Self, Pharmacy Records  amiodarone (PACERONE) 200 MG tablet [id number] Yes Take 1 tablet (200 mg total) by mouth daily. Antoine Poche, MD Taking Active Self, Pharmacy Records  amoxicillin-clavulanate (AUGMENTIN) 875-125 MG tablet [id number] Yes Take 1 tablet by mouth every 12 (twelve) hours for 14 days. Lorin Glass, MD Taking Active   atorvastatin (LIPITOR) 20 MG tablet [id number]  TAKE ONE TABLET BY MOUTH ONCE DAILY Branch, Dorothe Pea, MD  Active Self, Pharmacy Records  Cholecalciferol (VITAMIN D-3 PO) [id number] Yes Take 1 tablet by mouth daily. [provider] Taking Active Self, Pharmacy Records  dapagliflozin propanediol (FARXIGA) 10 MG TABS tablet [id number] Yes TAKE ONE TABLET BY MOUTH DAILY BEFORE Aundria Rud, MD Taking Active Self, Pharmacy Records  docusate sodium (COLACE) 100 MG capsule [id number]  Take 1 capsule (100 mg total) by mouth 2 (two) times daily.  Patient taking differently: Take 100 mg by mouth 2 (two) times daily as needed (constipation.).   Osvaldo Shipper, MD  Active Self, Pharmacy Records  glucose blood (ACCU-CHEK GUIDE) test strip [id number] Yes Use as instructed to monitor glucose twice daily Dani Gobble, NP Taking Active Self, Pharmacy Records  JANUVIA 100 MG tablet [id number] Yes TAKE ONE TABLET BY MOUTH ONCE DAILY Mechele Claude, MD Taking Active Self, Pharmacy Records  lactobacillus (FLORANEX/LACTINEX) PACK [id number] Yes Take 1 packet (1 g total) by mouth 3 (three) times daily with meals for 14 days. Lorin Glass, MD Taking Active   metFORMIN (GLUCOPHAGE-XR) 750 MG 24 hr tablet [id number] Yes Take 2 tablets (1,500 mg total) by mouth daily with breakfast.  Patient taking differently: Take 750 mg by mouth  daily with breakfast.   Mechele Claude, MD Taking Active Self, Pharmacy Records  oxyCODONE-acetaminophen [facility]) 10-325 MG tablet  [id number] Yes Take 1 tablet by mouth 3 (three) times daily as needed for pain.  Patient taking differently: Take 1 tablet by mouth every 6 (six) hours as needed for pain.   Osvaldo Shipper, MD Taking Active Self, Pharmacy Records  polyethylene glycol [facility] / GLYCOLAX) 17 g packet [id number] Yes Take 17 g by mouth daily.  Patient taking differently: Take 17 g by mouth daily as needed (constipation.).   Osvaldo Shipper, MD Taking Active Self, Pharmacy Records  Prasterone, DHEA, (DHEA PO) [id number] Yes Take 1 capsule by mouth daily. [provider] Taking Active Self, Pharmacy Records  rivaroxaban (XARELTO) 20 MG TABS tablet [id number] Yes TAKE ONE TABLET BY MOUTH DAILY WITH SUPPER Mechele Claude, MD Taking Active Self, Pharmacy Records  senna-docusate (SENOKOT-S) 8.6-50 MG tablet [id number] Yes Take 2 tablets by mouth at bedtime.  Patient taking differently: Take 2 tablets by mouth at bedtime as needed (constipation).   Osvaldo Shipper, MD Taking Active Self, Pharmacy Records  sulfamethoxazole-trimethoprim (BACTRIM DS) 800-160 MG tablet [id number] Yes Take 1 tablet by mouth every 12 (twelve) hours for 14 days. Lorin Glass, MD Taking Active             Home Care and Equipment/Supplies: Were Home Health Services Ordered?: No Any new equipment or medical supplies ordered?: No  Functional Questionnaire: Do you need assistance with bathing/showering or dressing?: No Do you need assistance with meal preparation?: No Do you need assistance with eating?: No Do you have difficulty maintaining continence: No Do you need assistance with getting out of bed/getting out of a chair/moving?: No Do you have difficulty managing or taking your medications?: No  Follow up appointments reviewed: PCP Follow-up appointment confirmed?: Yes Date of PCP follow-up appointment?: 05/29/23 Follow-up Provider: Mechele Claude  @ 210 pm Specialist Hospital Follow-up appointment confirmed?: Yes Date of  Specialist follow-up appointment?: 05/11/23 Follow-Up Specialty Provider:: Gershon Mussel Vu  @ 2 pm   05/24/23  IR @ 130 pm     06/01/23  cardiologist Dr. Wyline Mood @ 340 pm    06/14/23 General surgeon Marin Olp @ 1040 am (pt verbalizes understanding off all scheduled appointments) Do you need transportation to your follow-up appointment?: No Do you understand care options if your condition(s) worsen?:  (pt states he drives himself to all appts)  SDOH Interventions Today    Flowsheet Row Most Recent Value  SDOH Interventions   Food Insecurity Interventions Intervention Not Indicated  Housing Interventions Intervention Not Indicated  Transportation Interventions Intervention Not Indicated  Utilities Interventions Intervention Not Indicated       Irving Shows [facility], BSN RN Care Manager/ Transition of Care [place]/ [facility] [phone]

## 2023-05-11 ENCOUNTER — Ambulatory Visit: Payer: Medicaid Other | Admitting: Internal Medicine

## 2023-05-11 ENCOUNTER — Encounter: Payer: Self-pay | Admitting: Internal Medicine

## 2023-05-11 ENCOUNTER — Other Ambulatory Visit: Payer: Self-pay

## 2023-05-11 ENCOUNTER — Ambulatory Visit: Payer: Self-pay | Admitting: Internal Medicine

## 2023-05-11 VITALS — BP 131/82 | HR 94 | Temp 97.7°F | Wt 345.0 lb

## 2023-05-11 DIAGNOSIS — K651 Peritoneal abscess: Secondary | ICD-10-CM | POA: Diagnosis not present

## 2023-05-11 DIAGNOSIS — K5732 Diverticulitis of large intestine without perforation or abscess without bleeding: Secondary | ICD-10-CM

## 2023-05-11 NOTE — Patient Instructions (Signed)
I will forward chart to dr Lerry Liner field regarding your process and IR team to see if ct scan can be done earlier than 2/26   Finish 10 more days of bactrim/augmentin  See [place] in 2 weeks

## 2023-05-11 NOTE — Progress Notes (Signed)
[facility]  Patient Active Problem List   Diagnosis Date Noted   Abdominal wall abscess 04/27/2023   Intra-abdominal abscess (HCC) 10/01/2022   Atrial fibrillation with RVR (HCC) 06/27/2022   Severe sepsis (HCC) 06/19/2022   Sigmoid diverticulitis 06/19/2022   QT prolongation 06/19/2022   Mixed hyperlipidemia 06/19/2022   Acute pulmonary embolism (HCC) 06/19/2022   History of DVT (deep vein thrombosis) 06/19/2022   Diverticulitis of large intestine with perforation and abscess 06/19/2022   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/23/2021   History of pulmonary embolus (PE) 10/24/2020   Paroxysmal atrial fibrillation (HCC) 12/31/2019   Vitamin D deficiency    Type 2 diabetes mellitus with hyperglycemia, without long-term current use of insulin (HCC)    Sleep related hypoxia 11/01/2019   Severe obstructive sleep apnea-hypopnea syndrome 08/01/2019   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   Arteriovenous malformation 08/01/2019   Primary osteoarthritis involving multiple joints 06/21/2019   Dysphagia 10/17/2018   Anticoagulated by anticoagulation treatment 03/06/2018   Hx of non-ST elevation myocardial infarction (NSTEMI) 03/06/2018   Chronic heart failure with preserved ejection fraction (HFpEF) (HCC)    Chronic pain syndrome    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   MRN: [id number]  Chief Complaint  Patient  presents with   Follow-up    HPI:  Bradley Torres is a 63 y.o. male here for f/u hospital admission abd wall abscess  Reviewed chart inpatient 09/2022 diverticulits with rupture and intraabd abscess s/p drain and 2 weeks abx Patient readmitted late 03/2023 with abd wall abscess where previous drain was  Concern for colocutaneous fistula although ct doesn't show connection directly from abd wall abscess to the previous left lower quadrant fluid collection  Reviewed radiology notes as well 02/17/23 ir f/u "Persistent fistula to colon from the intraabd abscess; nearly no output from the drain; drain injection doesn't reveal enteric fistulization and drain was removed" 04/28/23 ir note: Ct drainage placement of recurrent abscess llq; superficial abd wall abscess mostly decompressed after 30 cc fluid obtained   There is 04/28/23 culture but not sure if from llq intraabd abscess or abd wall abscess (eoli and strep infantarius)  Patient discharged on bactrim and augmentin for 14 days course    The last visit with radiology on 05/05/23: The drain kept -- was putting out still up to 50 cc a day   He has a repeat abd pelv ct on 2/26 with ir   Allergies  Allergen Reactions   Aldactone [Spironolactone] Hives   Bystolic [Nebivolol Hcl] Swelling   Motrin [Ibuprofen] Other (See Comments)    Rectal bleed   Actos [Pioglitazone] Other (See Comments) and Cough    Dyspnea  Edema   Trulicity [Dulaglutide] Other (See Comments) and Cough    Shortness of breath GI intolerance   Zestril [Lisinopril] Other (See Comments) and Cough    Wheezing    Cozaar [Losartan]  Rash   Diflucan [Fluconazole] Rash   Hctz [Hydrochlorothiazide] Other (See Comments)    Gout flares.   Norvasc [Amlodipine] Itching and Other (See Comments)    Myalgias       Outpatient Medications Prior to Visit  Medication Sig Dispense Refill   Accu-Chek Softclix Lancets lancets Use as instructed to monitor glucose twice daily  100 each 12   allopurinol (ZYLOPRIM) 100 MG tablet TAKE ONE TABLET BY MOUTH ONCE DAILY 90 tablet 0   amiodarone (PACERONE) 200 MG tablet Take 1 tablet (200 mg total) by mouth daily. 90 tablet 1   amoxicillin-clavulanate (AUGMENTIN) 875-125 MG tablet Take 1 tablet by mouth every 12 (twelve) hours for 14 days. 28 tablet 0   atorvastatin (LIPITOR) 20 MG tablet TAKE ONE TABLET BY MOUTH ONCE DAILY 90 tablet 3   Cholecalciferol (VITAMIN D-3 PO) Take 1 tablet by mouth daily.     dapagliflozin propanediol (FARXIGA) 10 MG TABS tablet TAKE ONE TABLET BY MOUTH DAILY BEFORE BREAKFAST 90 tablet 0   docusate sodium (COLACE) 100 MG capsule Take 1 capsule (100 mg total) by mouth 2 (two) times daily. (Patient taking differently: Take 100 mg by mouth 2 (two) times daily as needed (constipation.).) 60 capsule 0   glucose blood (ACCU-CHEK GUIDE) test strip Use as instructed to monitor glucose twice daily 100 each 12   JANUVIA 100 MG tablet TAKE ONE TABLET BY MOUTH ONCE DAILY 90 tablet 0   lactobacillus (FLORANEX/LACTINEX) PACK Take 1 packet (1 g total) by mouth 3 (three) times daily with meals for 14 days. 42 packet 0   metFORMIN (GLUCOPHAGE-XR) 750 MG 24 hr tablet Take 2 tablets (1,500 mg total) by mouth daily with breakfast. (Patient taking differently: Take 750 mg by mouth daily with breakfast.) 180 tablet 1   oxyCODONE-acetaminophen (PERCOCET) 10-325 MG tablet Take 1 tablet by mouth 3 (three) times daily as needed for pain. (Patient taking differently: Take 1 tablet by mouth every 6 (six) hours as needed for pain.) 30 tablet 0   polyethylene glycol (MIRALAX / GLYCOLAX) 17 g packet Take 17 g by mouth daily. (Patient taking differently: Take 17 g by mouth daily as needed (constipation.).) 30 each 0   Prasterone, DHEA, (DHEA PO) Take 1 capsule by mouth daily.     rivaroxaban (XARELTO) 20 MG TABS tablet TAKE ONE TABLET BY MOUTH DAILY WITH SUPPER 90 tablet 0   senna-docusate (SENOKOT-S) 8.6-50 MG tablet Take 2 tablets by  mouth at bedtime. (Patient taking differently: Take 2 tablets by mouth at bedtime as needed (constipation).) 120 tablet 0   sulfamethoxazole-trimethoprim (BACTRIM DS) 800-160 MG tablet Take 1 tablet by mouth every 12 (twelve) hours for 14 days. 28 tablet 0   No facility-administered medications prior to visit.     Social History   Socioeconomic History   Marital status: Single    Spouse name: Not on file   Number of children: Not on file   Years of education: Not on file   Highest education level: Not on file  Occupational History   Occupation: disabled  Tobacco Use   Smoking status: Never    Passive exposure: Never   Smokeless tobacco: Never  Vaping Use   Vaping status: Never Used  Substance and Sexual Activity   Alcohol use: Yes    Comment: occ   Drug use: Not Currently    Types: Marijuana    Comment: daily; 05/19/20 no marijuana in past 6 months d/t going to pain management clinic   Sexual  activity: Not Currently    Birth control/protection: None  Other Topics Concern   Not on file  Social History Narrative   Not on file   Social Drivers of Health   Financial Resource Strain: Medium Risk (05/18/2020)   Overall Financial Resource Strain (CARDIA)    Difficulty of Paying Living Expenses: Somewhat hard  Food Insecurity: No Food Insecurity (05/10/2023)   Hunger Vital Sign    Worried About Running Out of Food in the Last Year: Never true    Ran Out of Food in the Last Year: Never true  Transportation Needs: No Transportation Needs (05/10/2023)   PRAPARE - Administrator, Civil Service (Medical): No    Lack of Transportation (Non-Medical): No  Physical Activity: Not on file  Stress: Stress Concern Present (05/15/2019)   [employer] of Occupational Health - Occupational Stress Questionnaire    Feeling of Stress : Rather much  Social Connections: Unknown (01/16/2020)   Social Connection and Isolation Panel [NHANES]    Frequency of Communication with  Friends and Family: More than three times a week    Frequency of Social Gatherings with Friends and Family: More than three times a week    Attends Religious Services: Not on file    Active Member of Clubs or Organizations: Not on file    Attends Banker Meetings: Not on file    Marital Status: Not on file  Intimate Partner Violence: Not At Risk (05/10/2023)   Humiliation, Afraid, Rape, and Kick questionnaire    Fear of Current or Ex-Partner: No    Emotionally Abused: No    Physically Abused: No    Sexually Abused: No      Review of Systems     Objective:    BP 131/82   Pulse 94   Temp 97.7 F (36.5 C) (Oral)   Wt (!) 345 lb (156.5 kg)   SpO2 96%   BMI 46.79 kg/m  Nursing note and vital signs reviewed.  Physical Exam     General/constitutional: no distress, pleasant; obese HEENT: Normocephalic, PER, Conj Clear, EOMI, Oropharynx clear Neck supple CV: rrr no mrg Lungs: clear to auscultation, normal respiratory effort Abd: Soft, nontender; stoma bag llq with minimal fluid; jp drain minimal purulent fluid Ext: no edema Skin: No Rash Neuro: nonfocal MSK: no peripheral joint swelling/tenderness/warmth; back spines nontender   Central line presence: none   Labs: Lab Results  Component Value Date   WBC 9.4 05/03/2023   HGB 10.5 (L) 05/03/2023   HCT 31.6 (L) 05/03/2023   MCV 92.4 05/03/2023   PLT 364 05/03/2023   Last metabolic panel Lab Results  Component Value Date   GLUCOSE 141 (H) 05/05/2023   NA 136 05/05/2023   K 4.4 05/05/2023   CL 99 05/05/2023   CO2 27 05/05/2023   BUN 19 05/05/2023   CREATININE 1.47 (H) 05/05/2023   GFRNONAA 54 (L) 05/05/2023   CALCIUM 8.9 05/05/2023   PHOS 3.4 06/19/2022   PROT 6.4 (L) 05/01/2023   ALBUMIN 2.4 (L) 05/01/2023   LABGLOB 3.3 02/21/2023   AGRATIO 1.2 08/02/2022   BILITOT 0.5 05/01/2023   ALKPHOS 67 05/01/2023   AST 13 (L) 05/01/2023   ALT 13 05/01/2023   ANIONGAP 10 05/05/2023      Component  Value Date/Time   CRP 10.4 (H) 05/01/2023 0521    Micro:  Serology:  Imaging: Reviewed   04/27/23 ct abd pelv with contrast 1. Heterogeneous collection in the left abdominal wall  at site of prior drainage catheter. Tract extends from the skin surface to the abdominal wall. In the deep subcutaneous tissues is a area of soft tissue density with internal air measuring 4.3 x 3.6 cm. Only minimal internal fluid. Surrounding fat stranding and inflammation. This does not definitively connect to the intra-abdominal contents or subjacent bowel. 2. Colonic diverticulosis without acute diverticulitis. 3. Cholelithiasis.   04/28/23 drainage ct Status post CT-guided drainage of abdominal abscess adjacent to the left colon.   During the course of the exam the majority of fluid in the anterior abdominal wall collection decompressed through the skin. No drain was attempted at this location.   Assessment & Plan:   Problem List Items Addressed This Visit     Sigmoid diverticulitis   Intra-abdominal abscess (HCC) - Primary   Relevant Orders   CBC   COMPLETE METABOLIC PANEL WITH GFR   C-reactive protein      No orders of the defined types were placed in this encounter.    63 year old male with PMH of pA-fib on AC, DVT/PE in 2019, DM2, HTN, CKD, HLD, pulmonary hypertension, CHFpEF, gout, CAD, CVA, morbid obesity/OSA on CPAP, chronic back pain, history of perforated sigmoid diverticulitis with abscess and JP drain placement in October 01 2022 ( cx with multiple organisms with mixed anaerobic flora, discharged in 14 days augmentin) s/p drain removal in February 17, 2023 who presented to the ED on 1/30 with redness and purulent versus feculent drainage from left lower quadrant at previous drain removal site.  Admitted with    # Abdominal wall abscess (no clear evidence of connection to intra-abdominal contents/subjacent per CT) 1/31 Status post attempted drainage and no drain placement per IR  note  Of note there was a new drain but I presume it goes into the LLQ  intraabd fluid collection  Cx strep infantarius and E coli ?intraabd abscess vs abd wall abscess     # Chronic draining colocutaneous fistula -General Surgery following and possible plan for elective surgery once he is medically optimized. Patient reports there was the previous intraabd abscess drain that was removed 02/17/23 and that has become fistula and a bag was removed  From 02/17/23 ir note it appears they didn't see fistula but clinically it appears so     In Assessment 05/11/23 He does have fistula The current drain is into an intraabd abscess and what I am unclear but it appears doesn't communicate with the stoma bag  If this is entirely all related to fistula he doesn't need more abx beyond the next 10 day (2 week planned course from 05/02/23). The abd wall abscess/cellulitis should also adequately respond  Surgical intervention to remove/repair fistula might be indicated  He has abd pelv ct 2/26 but I'll forward to IR to see if they can move it up in a week because the perc drain is not putting out much at all today 05/12/23  Clinically he feels well   -finish bactrim/augmentin in 10 more day -f/u id in 2 weeks -chart forwarded to surgery/IR; will ask IR to move up ct scan if possible -labs today   Follow-up: Return in about 2 weeks (around 05/25/2023).      Raymondo Band, MD [facility] 05/11/2023, 2:18 PM

## 2023-05-12 LAB — COMPLETE METABOLIC PANEL WITH GFR
AG Ratio: 1.1 (calc) (ref 1.0–2.5)
ALT: 12 U/L (ref 9–46)
AST: 13 U/L (ref 10–35)
Albumin: 3.5 g/dL — ABNORMAL LOW (ref 3.6–5.1)
Alkaline phosphatase (APISO): 65 U/L (ref 35–144)
BUN/Creatinine Ratio: 13 (calc) (ref 6–22)
BUN: 20 mg/dL (ref 7–25)
CO2: 23 mmol/L (ref 20–32)
Calcium: 9.1 mg/dL (ref 8.6–10.3)
Chloride: 105 mmol/L (ref 98–110)
Creat: 1.5 mg/dL — ABNORMAL HIGH (ref 0.70–1.35)
Globulin: 3.3 g/dL (ref 1.9–3.7)
Glucose, Bld: 114 mg/dL — ABNORMAL HIGH (ref 65–99)
Potassium: 4.2 mmol/L (ref 3.5–5.3)
Sodium: 142 mmol/L (ref 135–146)
Total Bilirubin: 0.3 mg/dL (ref 0.2–1.2)
Total Protein: 6.8 g/dL (ref 6.1–8.1)
eGFR: 53 mL/min/{1.73_m2} — ABNORMAL LOW (ref >=60)

## 2023-05-12 LAB — CBC
HCT: 35.2 % — ABNORMAL LOW (ref 38.5–50.0)
Hemoglobin: 11.6 g/dL — ABNORMAL LOW (ref 13.2–17.1)
MCH: 30.9 pg (ref 27.0–33.0)
MCHC: 33 g/dL (ref 32.0–36.0)
MCV: 93.6 fL (ref 80.0–100.0)
MPV: 9.4 fL (ref 7.5–12.5)
Platelets: 513 10*3/uL — ABNORMAL HIGH (ref 140–400)
RBC: 3.76 10*6/uL — ABNORMAL LOW (ref 4.20–5.80)
RDW: 14 % (ref 11.0–15.0)
WBC: 8.9 10*3/uL (ref 3.8–10.8)

## 2023-05-12 LAB — C-REACTIVE PROTEIN: CRP: 7.8 mg/L (ref <8.0)

## 2023-05-23 ENCOUNTER — Other Ambulatory Visit (HOSPITAL_COMMUNITY): Payer: Self-pay | Admitting: Student

## 2023-05-23 ENCOUNTER — Other Ambulatory Visit: Payer: Self-pay

## 2023-05-23 ENCOUNTER — Inpatient Hospital Stay (HOSPITAL_COMMUNITY)
Admission: EM | Admit: 2023-05-23 | Discharge: 2023-06-10 | DRG: 329 | Disposition: A | Discharge status: Skilled Nursing Facility | Payer: Medicaid Other | Attending: Internal Medicine | Admitting: Internal Medicine

## 2023-05-23 ENCOUNTER — Emergency Department (HOSPITAL_COMMUNITY): Payer: Medicaid Other

## 2023-05-23 ENCOUNTER — Ambulatory Visit (HOSPITAL_COMMUNITY)
Admission: RE | Admit: 2023-05-23 | Discharge: 2023-05-23 | Disposition: A | Discharge status: Home / Self Care | Payer: Medicaid Other | Source: Ambulatory Visit | Attending: Student | Admitting: Student

## 2023-05-23 ENCOUNTER — Encounter (HOSPITAL_COMMUNITY): Payer: Self-pay

## 2023-05-23 DIAGNOSIS — I480 Paroxysmal atrial fibrillation: Secondary | ICD-10-CM | POA: Diagnosis not present

## 2023-05-23 DIAGNOSIS — J9382 Other air leak: Secondary | ICD-10-CM | POA: Diagnosis not present

## 2023-05-23 DIAGNOSIS — N183 Chronic kidney disease, stage 3 unspecified: Secondary | ICD-10-CM | POA: Diagnosis present

## 2023-05-23 DIAGNOSIS — I130 Hypertensive heart and chronic kidney disease with heart failure and stage 1 through stage 4 chronic kidney disease, or unspecified chronic kidney disease: Secondary | ICD-10-CM | POA: Diagnosis present

## 2023-05-23 DIAGNOSIS — I5032 Chronic diastolic (congestive) heart failure: Secondary | ICD-10-CM | POA: Diagnosis present

## 2023-05-23 DIAGNOSIS — E1165 Type 2 diabetes mellitus with hyperglycemia: Secondary | ICD-10-CM | POA: Diagnosis present

## 2023-05-23 DIAGNOSIS — L02211 Cutaneous abscess of abdominal wall: Secondary | ICD-10-CM | POA: Diagnosis present

## 2023-05-23 DIAGNOSIS — K651 Peritoneal abscess: Secondary | ICD-10-CM | POA: Diagnosis not present

## 2023-05-23 DIAGNOSIS — Z79899 Other long term (current) drug therapy: Secondary | ICD-10-CM

## 2023-05-23 DIAGNOSIS — N179 Acute kidney failure, unspecified: Principal | ICD-10-CM | POA: Diagnosis present

## 2023-05-23 DIAGNOSIS — Z8673 Personal history of transient ischemic attack (TIA), and cerebral infarction without residual deficits: Secondary | ICD-10-CM

## 2023-05-23 DIAGNOSIS — E1122 Type 2 diabetes mellitus with diabetic chronic kidney disease: Secondary | ICD-10-CM | POA: Diagnosis not present

## 2023-05-23 DIAGNOSIS — Z888 Allergy status to other drugs, medicaments and biological substances status: Secondary | ICD-10-CM

## 2023-05-23 DIAGNOSIS — T85698A Other mechanical complication of other specified internal prosthetic devices, implants and grafts, initial encounter: Secondary | ICD-10-CM | POA: Diagnosis not present

## 2023-05-23 DIAGNOSIS — Z6841 Body Mass Index (BMI) 40.0 and over, adult: Secondary | ICD-10-CM | POA: Diagnosis not present

## 2023-05-23 DIAGNOSIS — K660 Peritoneal adhesions (postprocedural) (postinfection): Secondary | ICD-10-CM | POA: Diagnosis not present

## 2023-05-23 DIAGNOSIS — Z751 Person awaiting admission to adequate facility elsewhere: Secondary | ICD-10-CM

## 2023-05-23 DIAGNOSIS — E872 Acidosis, unspecified: Secondary | ICD-10-CM | POA: Diagnosis not present

## 2023-05-23 DIAGNOSIS — I952 Hypotension due to drugs: Secondary | ICD-10-CM | POA: Diagnosis not present

## 2023-05-23 DIAGNOSIS — R6521 Severe sepsis with septic shock: Secondary | ICD-10-CM | POA: Diagnosis not present

## 2023-05-23 DIAGNOSIS — Z1152 Encounter for screening for COVID-19: Secondary | ICD-10-CM | POA: Diagnosis not present

## 2023-05-23 DIAGNOSIS — Z86711 Personal history of pulmonary embolism: Secondary | ICD-10-CM | POA: Diagnosis present

## 2023-05-23 DIAGNOSIS — L0291 Cutaneous abscess, unspecified: Secondary | ICD-10-CM | POA: Insufficient documentation

## 2023-05-23 DIAGNOSIS — Z7901 Long term (current) use of anticoagulants: Secondary | ICD-10-CM | POA: Diagnosis not present

## 2023-05-23 DIAGNOSIS — D631 Anemia in chronic kidney disease: Secondary | ICD-10-CM | POA: Diagnosis not present

## 2023-05-23 DIAGNOSIS — K8020 Calculus of gallbladder without cholecystitis without obstruction: Secondary | ICD-10-CM | POA: Diagnosis not present

## 2023-05-23 DIAGNOSIS — I2510 Atherosclerotic heart disease of native coronary artery without angina pectoris: Secondary | ICD-10-CM | POA: Diagnosis present

## 2023-05-23 DIAGNOSIS — I2699 Other pulmonary embolism without acute cor pulmonale: Secondary | ICD-10-CM | POA: Diagnosis not present

## 2023-05-23 DIAGNOSIS — Z86718 Personal history of other venous thrombosis and embolism: Secondary | ICD-10-CM

## 2023-05-23 DIAGNOSIS — G4733 Obstructive sleep apnea (adult) (pediatric): Secondary | ICD-10-CM | POA: Diagnosis not present

## 2023-05-23 DIAGNOSIS — E871 Hypo-osmolality and hyponatremia: Secondary | ICD-10-CM | POA: Diagnosis present

## 2023-05-23 DIAGNOSIS — E440 Moderate protein-calorie malnutrition: Secondary | ICD-10-CM | POA: Diagnosis present

## 2023-05-23 DIAGNOSIS — I5030 Unspecified diastolic (congestive) heart failure: Secondary | ICD-10-CM | POA: Diagnosis not present

## 2023-05-23 DIAGNOSIS — K632 Fistula of intestine: Principal | ICD-10-CM | POA: Diagnosis present

## 2023-05-23 DIAGNOSIS — Z91199 Patient's noncompliance with other medical treatment and regimen due to unspecified reason: Secondary | ICD-10-CM

## 2023-05-23 DIAGNOSIS — I959 Hypotension, unspecified: Secondary | ICD-10-CM

## 2023-05-23 DIAGNOSIS — I110 Hypertensive heart disease with heart failure: Secondary | ICD-10-CM | POA: Diagnosis not present

## 2023-05-23 DIAGNOSIS — Z933 Colostomy status: Principal | ICD-10-CM

## 2023-05-23 DIAGNOSIS — R54 Age-related physical debility: Secondary | ICD-10-CM | POA: Diagnosis not present

## 2023-05-23 DIAGNOSIS — Z860100 Personal history of colon polyps, unspecified: Secondary | ICD-10-CM

## 2023-05-23 DIAGNOSIS — N1831 Chronic kidney disease, stage 3a: Secondary | ICD-10-CM | POA: Diagnosis not present

## 2023-05-23 DIAGNOSIS — Z8249 Family history of ischemic heart disease and other diseases of the circulatory system: Secondary | ICD-10-CM

## 2023-05-23 DIAGNOSIS — A419 Sepsis, unspecified organism: Secondary | ICD-10-CM | POA: Diagnosis not present

## 2023-05-23 DIAGNOSIS — I4891 Unspecified atrial fibrillation: Secondary | ICD-10-CM | POA: Diagnosis not present

## 2023-05-23 DIAGNOSIS — Z800 Family history of malignant neoplasm of digestive organs: Secondary | ICD-10-CM

## 2023-05-23 DIAGNOSIS — Z4803 Encounter for change or removal of drains: Secondary | ICD-10-CM | POA: Insufficient documentation

## 2023-05-23 DIAGNOSIS — K5730 Diverticulosis of large intestine without perforation or abscess without bleeding: Secondary | ICD-10-CM | POA: Diagnosis not present

## 2023-05-23 DIAGNOSIS — Z7984 Long term (current) use of oral hypoglycemic drugs: Secondary | ICD-10-CM

## 2023-05-23 DIAGNOSIS — M7989 Other specified soft tissue disorders: Secondary | ICD-10-CM | POA: Diagnosis not present

## 2023-05-23 DIAGNOSIS — G4734 Idiopathic sleep related nonobstructive alveolar hypoventilation: Secondary | ICD-10-CM | POA: Diagnosis present

## 2023-05-23 DIAGNOSIS — I252 Old myocardial infarction: Secondary | ICD-10-CM

## 2023-05-23 DIAGNOSIS — Z978 Presence of other specified devices: Secondary | ICD-10-CM

## 2023-05-23 HISTORY — PX: IR CATHETER TUBE CHANGE: IMG717

## 2023-05-23 LAB — CBC
HCT: 35.6 % — ABNORMAL LOW (ref 39.0–52.0)
Hemoglobin: 11.6 g/dL — ABNORMAL LOW (ref 13.0–17.0)
MCH: 31 pg (ref 26.0–34.0)
MCHC: 32.6 g/dL (ref 30.0–36.0)
MCV: 95.2 fL (ref 80.0–100.0)
Platelets: 425 10*3/uL — ABNORMAL HIGH (ref 150–400)
RBC: 3.74 MIL/uL — ABNORMAL LOW (ref 4.22–5.81)
RDW: 15.3 % (ref 11.5–15.5)
WBC: 9.9 10*3/uL (ref 4.0–10.5)
nRBC: 0 % (ref 0.0–0.2)

## 2023-05-23 LAB — COMPREHENSIVE METABOLIC PANEL
ALT: 13 U/L (ref 0–44)
AST: 24 U/L (ref 15–41)
Albumin: 2.6 g/dL — ABNORMAL LOW (ref 3.5–5.0)
Alkaline Phosphatase: 64 U/L (ref 38–126)
Anion gap: 17 — ABNORMAL HIGH (ref 5–15)
BUN: 61 mg/dL — ABNORMAL HIGH (ref 8–23)
CO2: 24 mmol/L (ref 22–32)
Calcium: 9.4 mg/dL (ref 8.9–10.3)
Chloride: 95 mmol/L — ABNORMAL LOW (ref 98–111)
Creatinine, Ser: 2.9 mg/dL — ABNORMAL HIGH (ref 0.61–1.24)
GFR, Estimated: 24 mL/min — ABNORMAL LOW (ref >60)
Glucose, Bld: 160 mg/dL — ABNORMAL HIGH (ref 70–99)
Potassium: 4.6 mmol/L (ref 3.5–5.1)
Sodium: 136 mmol/L (ref 135–145)
Total Bilirubin: 0.9 mg/dL (ref 0.0–1.2)
Total Protein: 7.4 g/dL (ref 6.5–8.1)

## 2023-05-23 LAB — RESP PANEL BY RT-PCR (RSV, FLU A&B, COVID)  RVPGX2
Influenza A by PCR: NEGATIVE
Influenza B by PCR: NEGATIVE
Resp Syncytial Virus by PCR: NEGATIVE
SARS Coronavirus 2 by RT PCR: NEGATIVE

## 2023-05-23 LAB — I-STAT CG4 LACTIC ACID, ED: Lactic Acid, Venous: 3.1 mmol/L (ref 0.5–1.9)

## 2023-05-23 LAB — LIPASE, BLOOD: Lipase: 25 U/L (ref 11–51)

## 2023-05-23 MED ORDER — OXYCODONE HCL 5 MG PO TABS
5.0000 mg | ORAL_TABLET | Freq: Four times a day (QID) | ORAL | Status: DC | PRN
  Administered 2023-05-24 (×2): 5 mg via ORAL
  Filled 2023-05-23 (×2): qty 1

## 2023-05-23 MED ORDER — IOHEXOL 300 MG/ML  SOLN
50.0000 mL | Freq: Once | INTRAMUSCULAR | Status: AC | PRN
  Administered 2023-05-23: 15 mL

## 2023-05-23 MED ORDER — FENTANYL CITRATE PF 50 MCG/ML IJ SOSY
12.5000 ug | PREFILLED_SYRINGE | INTRAMUSCULAR | Status: DC | PRN
  Administered 2023-05-30: 50 ug via INTRAVENOUS
  Filled 2023-05-23: qty 1

## 2023-05-23 MED ORDER — RIVAROXABAN 20 MG PO TABS: 20.0000 mg | ORAL_TABLET | Freq: Every day | ORAL | Status: DC

## 2023-05-23 MED ORDER — SODIUM CHLORIDE 0.9 % IV BOLUS
500.0000 mL | Freq: Once | INTRAVENOUS | Status: AC
  Administered 2023-05-23: 500 mL via INTRAVENOUS

## 2023-05-23 MED ORDER — SODIUM CHLORIDE 0.9% FLUSH
3.0000 mL | Freq: Two times a day (BID) | INTRAVENOUS | Status: DC
  Administered 2023-05-24 – 2023-06-02 (×13): 3 mL via INTRAVENOUS

## 2023-05-23 MED ORDER — ACETAMINOPHEN 325 MG PO TABS
650.0000 mg | ORAL_TABLET | Freq: Four times a day (QID) | ORAL | Status: DC | PRN
  Administered 2023-05-24: 650 mg via ORAL
  Filled 2023-05-23: qty 2

## 2023-05-23 MED ORDER — SENNA 8.6 MG PO TABS: 1.0000 | ORAL_TABLET | Freq: Every day | ORAL | Status: DC | PRN

## 2023-05-23 MED ORDER — INSULIN ASPART 100 UNIT/ML IJ SOLN
0.0000 [IU] | Freq: Three times a day (TID) | INTRAMUSCULAR | Status: DC
  Administered 2023-05-24 – 2023-05-25 (×2): 1 [IU] via SUBCUTANEOUS
  Administered 2023-05-25: 3 [IU] via SUBCUTANEOUS
  Administered 2023-05-26 (×2): 1 [IU] via SUBCUTANEOUS

## 2023-05-23 MED ORDER — SODIUM CHLORIDE 0.9 % IV SOLN: INTRAVENOUS | Status: AC

## 2023-05-23 MED ORDER — ATORVASTATIN CALCIUM 10 MG PO TABS
20.0000 mg | ORAL_TABLET | Freq: Every day | ORAL | Status: DC
  Administered 2023-05-24 – 2023-05-30 (×7): 20 mg via ORAL
  Filled 2023-05-23 (×7): qty 2

## 2023-05-23 MED ORDER — ALLOPURINOL 100 MG PO TABS
100.0000 mg | ORAL_TABLET | Freq: Every day | ORAL | Status: DC
  Administered 2023-05-24 – 2023-05-30 (×7): 100 mg via ORAL
  Filled 2023-05-23 (×7): qty 1

## 2023-05-23 MED ORDER — ACETAMINOPHEN 650 MG RE SUPP: 650.0000 mg | Freq: Four times a day (QID) | RECTAL | Status: DC | PRN

## 2023-05-23 MED ORDER — AMIODARONE HCL 200 MG PO TABS
200.0000 mg | ORAL_TABLET | Freq: Every day | ORAL | Status: DC
  Administered 2023-05-24 – 2023-05-30 (×7): 200 mg via ORAL
  Filled 2023-05-23 (×7): qty 1

## 2023-05-23 MED ORDER — METRONIDAZOLE 500 MG/100ML IV SOLN
500.0000 mg | Freq: Two times a day (BID) | INTRAVENOUS | Status: DC
  Administered 2023-05-24 – 2023-05-26 (×7): 500 mg via INTRAVENOUS
  Filled 2023-05-23 (×7): qty 100

## 2023-05-23 MED ORDER — LIDOCAINE HCL 1 % IJ SOLN
INTRAMUSCULAR | Status: AC
  Filled 2023-05-23: qty 20

## 2023-05-23 MED ORDER — IOHEXOL 300 MG/ML  SOLN
30.0000 mL | Freq: Once | INTRAMUSCULAR | Status: AC | PRN
  Administered 2023-05-23: 30 mL via ORAL

## 2023-05-23 MED ORDER — SODIUM CHLORIDE 0.9 % IV SOLN
2.0000 g | INTRAVENOUS | Status: AC
  Administered 2023-05-24 – 2023-06-05 (×14): 2 g via INTRAVENOUS
  Filled 2023-05-23 (×14): qty 20

## 2023-05-23 MED ORDER — INSULIN ASPART 100 UNIT/ML IJ SOLN: 0.0000 [IU] | Freq: Every day | INTRAMUSCULAR | Status: DC

## 2023-05-23 NOTE — H&P (Signed)
 Marland Kitchen

## 2023-05-23 NOTE — ED Triage Notes (Signed)
 Pt is coming in for increased weakness, over the last few days, he was recnetly admitted for increased drainage from the post op site in the LLQ that was due to a bowel abscess, he mentions that the had the drain changed out today in radiology from a JP drain to a bag drain. He states he has been on outpatient ABX and has had a a loss of appetite. Lastly he endorses some lightheadedness, and general weakness.

## 2023-05-23 NOTE — Procedures (Signed)
 Interventional Radiology Procedure Note   History of Present Illness: Bradley Torres is a 63 y.o. male known to IR. History of CVA, MI, HTN, DVT, CAD, diverticulitis. IR placed a LLQ abscess into a diverticular abscess with fistula on 7.6.24 removed on 11.22.24. Patient then presented to the ED at [facility] on 1.30.25 with drainage from the prior IR drain site. There was both recurrent intraabdominal abscess as well as abdominal wall collection.  Both were addressed 04/28/23. New drain into the abdominal abscess.  The wall abscess was aspirated.   Patient has outpt appointment tomorrow 05/24/23.  He called our service today insisting to be seen for drain eval.   He reports feeling "bad".  He is insisting on going to the ED triage to be seen.     Procedure:  We performed image guided drain exchange. Upsize to 61F drain.  .  Complications: None  Findings:  - persisting abscess - fistula to the bowel  Recommendations:  - will have on schedule for VIR drain checks at clinic - follow up with surgery on schedule - patient is insisting he go to the ED triage now.  He "feels bad".   Signed,  Yvone Neu. Loreta Ave, DO, ABVM, RPVI

## 2023-05-23 NOTE — ED Provider Notes (Signed)
 [facility]ER[facility]PARTMENT AT [facility] Provider Note   CSN: [id number] Arrival date & time: 05/23/23  1612     History  Chief Complaint  Patient presents with   Weakness    Bradley Torres is a 63 y.o. male.  HPI   63 year old male presents emergency department with general unwell feeling.  Patient has history of left lower quadrant abscess and fistula that is currently being treated with an IR drain.  It appears that this was replaced today.  Patient states over the past week he has had a significant decline and decreased p.o. intake.  Admits to ongoing abdominal pain, currently on antibiotics.  Denies any acute fever.  Home Medications Prior to Admission medications   Medication Sig Start Date End Date Taking? Authorizing Provider  Accu-Chek Softclix Lancets lancets Use as instructed to monitor glucose twice daily 09/23/21   Dani Gobble, NP  allopurinol (ZYLOPRIM) 100 MG tablet TAKE ONE TABLET BY MOUTH ONCE DAILY 04/04/23   Mechele Claude, MD  amiodarone (PACERONE) 200 MG tablet Take 1 tablet (200 mg total) by mouth daily. 01/12/23   Antoine Poche, MD  atorvastatin (LIPITOR) 20 MG tablet TAKE ONE TABLET BY MOUTH ONCE DAILY 08/01/22   Antoine Poche, MD  Cholecalciferol (VITAMIN D-3 PO) Take 1 tablet by mouth daily.    [provider]  dapagliflozin propanediol (FARXIGA) 10 MG TABS tablet TAKE ONE TABLET BY MOUTH DAILY BEFORE BREAKFAST 04/05/23   Mechele Claude, MD  docusate sodium (COLACE) 100 MG capsule Take 1 capsule (100 mg total) by mouth 2 (two) times daily. Patient taking differently: Take 100 mg by mouth 2 (two) times daily as needed (constipation.). 06/28/22   Osvaldo Shipper, MD  glucose blood (ACCU-CHEK GUIDE) test strip Use as instructed to monitor glucose twice daily 10/28/21   Dani Gobble, NP  JANUVIA 100 MG tablet TAKE ONE TABLET BY MOUTH ONCE DAILY 04/05/23   Mechele Claude, MD  metFORMIN (GLUCOPHAGE-XR) 750 MG 24 hr tablet Take  2 tablets (1,500 mg total) by mouth daily with breakfast. Patient taking differently: Take 750 mg by mouth daily with breakfast. 05/11/22   Mechele Claude, MD  oxyCODONE-acetaminophen (PERCOCET) 10-325 MG tablet Take 1 tablet by mouth 3 (three) times daily as needed for pain. Patient taking differently: Take 1 tablet by mouth every 6 (six) hours as needed for pain. 06/28/22   Osvaldo Shipper, MD  polyethylene glycol (MIRALAX / GLYCOLAX) 17 g packet Take 17 g by mouth daily. Patient taking differently: Take 17 g by mouth daily as needed (constipation.). 06/28/22   Osvaldo Shipper, MD  Prasterone, DHEA, (DHEA PO) Take 1 capsule by mouth daily.    [provider]  rivaroxaban (XARELTO) 20 MG TABS tablet TAKE ONE TABLET BY MOUTH DAILY WITH SUPPER 03/20/23   Mechele Claude, MD  senna-docusate (SENOKOT-S) 8.6-50 MG tablet Take 2 tablets by mouth at bedtime. Patient taking differently: Take 2 tablets by mouth at bedtime as needed (constipation). 06/28/22   Osvaldo Shipper, MD      Allergies    Aldactone [spironolactone], Bystolic [nebivolol hcl], Motrin [ibuprofen], Actos [pioglitazone], Trulicity [dulaglutide], Zestril [lisinopril], Cozaar [losartan], Diflucan [fluconazole], Hctz [hydrochlorothiazide], and Norvasc [amlodipine]    Review of Systems   Review of Systems  Constitutional:  Positive for appetite change and fatigue. Negative for fever.  Respiratory:  Negative for shortness of breath.   Cardiovascular:  Negative for chest pain.  Gastrointestinal:  Positive for abdominal pain. Negative for diarrhea and vomiting.  Skin:  Negative for rash.  Neurological:  Negative for headaches.    Physical Exam Updated Vital Signs BP 103/70 (BP Location: Right Arm)   Pulse 84   Temp 98.1 F (36.7 C)   Resp 20   SpO2 100%  Physical Exam Vitals and nursing note reviewed.  Constitutional:      General: He is not in acute distress.    Appearance: Normal appearance.  HENT:     Head:  Normocephalic.     Mouth/Throat:     Mouth: Mucous membranes are moist.  Cardiovascular:     Rate and Rhythm: Normal rate.  Pulmonary:     Effort: Pulmonary effort is normal. No respiratory distress.  Abdominal:     Palpations: Abdomen is soft.     Tenderness: There is no abdominal tenderness.     Comments: Abdominal drain in place with surrounding dressing, serosanguineous fluid in drain line  Skin:    General: Skin is warm.  Neurological:     Mental Status: He is alert and oriented to person, place, and time. Mental status is at baseline.  Psychiatric:        Mood and Affect: Mood normal.     ED Results / Procedures / Treatments   Labs (all labs ordered are listed, but only abnormal results are displayed) Labs Reviewed  CBC - Abnormal; Notable for the following components:      Result Value   RBC 3.74 (*)    Hemoglobin 11.6 (*)    HCT 35.6 (*)    Platelets 425 (*)    All other components within normal limits  COMPREHENSIVE METABOLIC PANEL - Abnormal; Notable for the following components:   Chloride 95 (*)    Glucose, Bld 160 (*)    BUN 61 (*)    Creatinine, Ser 2.90 (*)    Albumin 2.6 (*)    GFR, Estimated 24 (*)    Anion gap 17 (*)    All other components within normal limits  I-STAT CG4 LACTIC ACID, ED - Abnormal; Notable for the following components:   Lactic Acid, Venous 3.1 (*)    All other components within normal limits  RESP PANEL BY RT-PCR (RSV, FLU A&B, COVID)  RVPGX2  CULTURE, BLOOD (ROUTINE X 2)  CULTURE, BLOOD (ROUTINE X 2)  LIPASE, BLOOD  URINALYSIS, ROUTINE W REFLEX MICROSCOPIC  I-STAT CG4 LACTIC ACID, ED    EKG None  Radiology CT ABDOMEN PELVIS WO CONTRAST Result Date: 05/23/2023 CLINICAL DATA:  Concern for intraabdominal abscess. EXAM: CT ABDOMEN AND PELVIS WITHOUT CONTRAST TECHNIQUE: Multidetector CT imaging of the abdomen and pelvis was performed following the standard protocol without IV contrast. RADIATION DOSE REDUCTION: This exam was  performed according to the departmental dose-optimization program which includes automated exposure control, adjustment of the mA and/or kV according to patient size and/or use of iterative reconstruction technique. COMPARISON:  CT abdomen and pelvis 04/27/2023. FINDINGS: Lower chest: No acute abnormality. Hepatobiliary: Small gallstones are present. There is no biliary ductal dilatation gestation. Liver is within normal limits. Pancreas: Unremarkable. No pancreatic ductal dilatation or surrounding inflammatory changes. Spleen: Normal in size without focal abnormality. Adrenals/Urinary Tract: There is a 4.5 cm low-density area in the superior pole of the left kidney, most likely a cyst. Otherwise, kidneys, adrenal glands and bladder are within normal limits. Stomach/Bowel: Stomach is within normal limits. Appendix appears normal. Focal small bowel wall thickening is seen adjacent to intraabdominal fluid collection in the left anterior abdomen without surrounding inflammation. There is  no bowel obstruction or pneumatosis. There is sigmoid and descending colon diverticulosis. Vascular/Lymphatic: Aortic atherosclerosis. No enlarged abdominal or pelvic lymph nodes. Reproductive: Prostate is unremarkable. Other: There is a percutaneous pigtail catheter in the left anterior abdominal wall with distal catheter tip within previously identified intraabdominal abscess. The abscess collection has resolved/decreased in the interval. There is a adjacent small bowel wall thickening in this region, likely reactive. Air-fluid collection in the deep subcu cutaneous tissues along the catheter tract persists measuring 2.6 by 3.0 x 6.1 cm. There is subcutaneous inflammatory stranding surrounding this collection. There is no ascites or free air. Musculoskeletal: L4-5 posterior fusion hardware is present. IMPRESSION: 1. Percutaneous pigtail catheter in the left anterior abdominal wall with distal catheter tip within previously identified  intraabdominal abscess. The abscess collection has resolved/decreased in the interval. 2. Persistent air-fluid collection in the deep subcutaneous tissues along the catheter tract measuring 2.6 x 3.0 x 6.1 cm worrisome for abdominal wall abscess. 3. Adjacent small bowel wall thickening in this region is likely reactive. 4. Cholelithiasis. 5. Colonic diverticulosis. 6. Aortic atherosclerosis. Aortic Atherosclerosis (ICD10-I70.0). Electronically Signed   By: Darliss Cheney M.D.   On: 05/23/2023 21:15   IR Catheter Tube Change Result Date: 05/23/2023 INDICATION: 63 year old male with diverticulitis history, prior abscess, as well as abdominal wall abscess. Prior drainage was performed 04/28/2023 of the intra-abdominal abscess with spontaneous decompression of the abdominal wall abscess. He presents feeling acutely ill, with decreasing drainage through the intra-abdominal drain and increasing drainage around the drain at the skin site. EXAM: IMAGE GUIDED DRAIN INJECTION AND EXCHANGE MEDICATIONS: The patient is currently receiving oral antibiotics. ANESTHESIA/SEDATION: None COMPLICATIONS: None PROCEDURE: Informed written consent was obtained from the patient after a thorough discussion of the procedural risks, benefits and alternatives. All questions were addressed. Maximal Sterile Barrier Technique was utilized including caps, mask, sterile gowns, sterile gloves, sterile drape, hand hygiene and skin antiseptic. A timeout was performed prior to the initiation of the procedure. Patient was positioned supine under the image intensifier. The drain in the skin in the left lower quadrant were prepped and draped in the usual sterile fashion. Scout images were acquired. The drain was injected recording images. Modified Seldinger technique was then used to exchange the drain for a new upsize 14 French pigtail drain catheter. 1% lidocaine was used for local anesthesia. The drain was sutured in position and attached to gravity  drain. Patient tolerated the procedure well and remained hemodynamically stable throughout. No complications were encountered and no significant blood loss. FINDINGS: Abscess persists with partially occluded 12 French drain. 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 IMPRESSION: Status post image guided exchange and up size of left lower quadrant drain to a 14 French pigtail drain catheter, attached to gravity. Fistula confirmed. Signed, Yvone Neu. Miachel Roux, RPVI [facility] Electronically Signed   By: Gilmer Mor D.O.   On: 05/23/2023 16:49    Procedures Procedures    Medications Ordered in ED Medications  iohexol (OMNIPAQUE) 300 MG/ML solution 30 mL (30 mLs Oral Contrast Given 05/23/23 2100)  sodium chloride 0.9 % bolus 500 mL (500 mLs Intravenous New Bag/Given 05/23/23 2122)  sodium chloride 0.9 % bolus 500 mL (500 mLs Intravenous New Bag/Given 05/23/23 2122)    ED Course/ Medical Decision Making/ A&P                                 Medical Decision  Making Risk Decision regarding hospitalization.   63 year old male presents emergency department after general decline over the past couple weeks.  Currently has drain in the left lower quadrant secondary to abscess/fistula that is being followed by as an outpatient.  Vitals are normal and stable.  Blood work shows worsening kidney dysfunction with a creatinine close to 2.9.  IV fluids ordered, last echocardiogram reviewed.  Repeat CT of the abdomen pelvis shows intact drain but concern for ongoing subcutaneous findings, possible abscess.  Consulted with Dr. Derrell Lolling, concerned that this abscess formation is more acute.  This finding has been present on a previous CT scan, they are monitoring, no acute surgical intervention recommended at this time.  Will plan to contact IR tomorrow for further evaluation.  Patients evaluation and results requires admission for further treatment and care.   Spoke with hospitalist, reviewed patient's ED course and they accept admission.  Patient agrees with admission plan, offers no new complaints and is stable/unchanged at time of admit.        Final Clinical Impression(s) / ED Diagnoses Final diagnoses:  AKI (acute kidney injury) [facility])    Rx / DC Orders ED Discharge Orders     None         Rozelle Logan, DO 05/23/23 2309

## 2023-05-23 NOTE — ED Provider Triage Note (Signed)
 Emergency Medicine Provider Triage Evaluation Note  Bradley Torres , a 63 y.o. male  was evaluated in triage.  Pt complains of "feeling bad".  Patient with history of CVA, MI, hypertension, DVT, CAD, diverticulitis.  Patient initially had LLQ abscess into diverticular abscess with fistula on 10/15/2022.  Patient had IR drain placed and then removed on 02/17/2023.  Patient return to ED at [facility] on 04/27/2023 with drainage from prior IR drain site.  There was both recurrent intra-abdominal abscess as well as abdominal wall collection.  Both were addressed on 04/28/23.  New drain was placed into the abdominal abscess.   Patient was set to see IR tomorrow on 2/26 but called office today insisting to be seen.  Patient reports he was "feeling bad" and asked numerous times to go to ED triage throughout interaction with Dr. Loreta Ave.  At this office visit, they performed an image guided drain exchange and upsized to a 14 [place] drain.  Findings were concerning for persisting abscess and a fistula to the bowel.  Review of Systems  Positive:  Negative:   Physical Exam  BP 103/70 (BP Location: Right Arm)   Pulse 84   Temp 98.1 F (36.7 C)   Resp 20   SpO2 100%  Gen:   Awake, no distress   Resp:  Normal effort  MSK:   Moves extremities without difficulty  Other:    Medical Decision Making  Medically screening exam initiated at 4:42 PM.  Appropriate orders placed.  Bradley Torres was informed that the remainder of the evaluation will be completed by another provider, this initial triage assessment does not replace that evaluation, and the importance of remaining in the ED until their evaluation is complete.     Al Decant, PA-C 05/23/23 1643

## 2023-05-24 ENCOUNTER — Other Ambulatory Visit: Payer: Medicaid Other

## 2023-05-24 ENCOUNTER — Encounter (HOSPITAL_COMMUNITY): Payer: Self-pay | Admitting: Family Medicine

## 2023-05-24 DIAGNOSIS — N183 Chronic kidney disease, stage 3 unspecified: Secondary | ICD-10-CM | POA: Diagnosis not present

## 2023-05-24 DIAGNOSIS — K632 Fistula of intestine: Secondary | ICD-10-CM | POA: Diagnosis not present

## 2023-05-24 DIAGNOSIS — N179 Acute kidney failure, unspecified: Secondary | ICD-10-CM | POA: Diagnosis not present

## 2023-05-24 DIAGNOSIS — E119 Type 2 diabetes mellitus without complications: Secondary | ICD-10-CM | POA: Diagnosis not present

## 2023-05-24 LAB — APTT: aPTT: 41 s — ABNORMAL HIGH (ref 24–36)

## 2023-05-24 LAB — URINALYSIS, ROUTINE W REFLEX MICROSCOPIC
Bilirubin Urine: NEGATIVE
Glucose, UA: >=500 mg/dL — AB
Hgb urine dipstick: NEGATIVE
Ketones, ur: NEGATIVE mg/dL
Leukocytes,Ua: NEGATIVE
Nitrite: NEGATIVE
Protein, ur: NEGATIVE mg/dL
Specific Gravity, Urine: 1.013 (ref 1.005–1.030)
pH: 5 (ref 5.0–8.0)

## 2023-05-24 LAB — BASIC METABOLIC PANEL
Anion gap: 11 (ref 5–15)
BUN: 51 mg/dL — ABNORMAL HIGH (ref 8–23)
CO2: 22 mmol/L (ref 22–32)
Calcium: 8.7 mg/dL — ABNORMAL LOW (ref 8.9–10.3)
Chloride: 101 mmol/L (ref 98–111)
Creatinine, Ser: 2.25 mg/dL — ABNORMAL HIGH (ref 0.61–1.24)
GFR, Estimated: 32 mL/min — ABNORMAL LOW (ref >60)
Glucose, Bld: 123 mg/dL — ABNORMAL HIGH (ref 70–99)
Potassium: 3.6 mmol/L (ref 3.5–5.1)
Sodium: 134 mmol/L — ABNORMAL LOW (ref 135–145)

## 2023-05-24 LAB — GLUCOSE, CAPILLARY
Glucose-Capillary: 119 mg/dL — ABNORMAL HIGH (ref 70–99)
Glucose-Capillary: 130 mg/dL — ABNORMAL HIGH (ref 70–99)
Glucose-Capillary: 138 mg/dL — ABNORMAL HIGH (ref 70–99)
Glucose-Capillary: 196 mg/dL — ABNORMAL HIGH (ref 70–99)
Glucose-Capillary: 182 mg/dL — ABNORMAL HIGH (ref 70–99)

## 2023-05-24 LAB — HEPARIN LEVEL (UNFRACTIONATED): Heparin Unfractionated: 0.45 [IU]/mL (ref 0.30–0.70)

## 2023-05-24 MED ORDER — SODIUM CHLORIDE 0.9 % IV SOLN: INTRAVENOUS | Status: AC

## 2023-05-24 MED ORDER — ZINC OXIDE 40 % EX OINT
TOPICAL_OINTMENT | CUTANEOUS | Status: DC | PRN
  Filled 2023-05-24: qty 57

## 2023-05-24 MED ORDER — OXYCODONE-ACETAMINOPHEN 5-325 MG PO TABS
1.0000 | ORAL_TABLET | Freq: Three times a day (TID) | ORAL | Status: DC | PRN
  Administered 2023-05-24 – 2023-05-25 (×2): 1 via ORAL
  Filled 2023-05-24 (×2): qty 1

## 2023-05-24 MED ORDER — HEPARIN (PORCINE) 25000 UT/250ML-% IV SOLN
2750.0000 [IU]/h | INTRAVENOUS | Status: DC
  Administered 2023-05-24: 1700 [IU]/h via INTRAVENOUS
  Administered 2023-05-25: 1950 [IU]/h via INTRAVENOUS
  Administered 2023-05-25: 2100 [IU]/h via INTRAVENOUS
  Administered 2023-05-26: 2400 [IU]/h via INTRAVENOUS
  Administered 2023-05-26: 2700 [IU]/h via INTRAVENOUS
  Administered 2023-05-26: 2400 [IU]/h via INTRAVENOUS
  Administered 2023-05-27: 2800 [IU]/h via INTRAVENOUS
  Administered 2023-05-27 – 2023-05-28 (×3): 2700 [IU]/h via INTRAVENOUS
  Administered 2023-05-28 – 2023-05-29 (×3): 2750 [IU]/h via INTRAVENOUS
  Filled 2023-05-24 (×11): qty 250

## 2023-05-24 MED ORDER — CALCIUM POLYCARBOPHIL 625 MG PO TABS
625.0000 mg | ORAL_TABLET | Freq: Two times a day (BID) | ORAL | Status: DC
  Administered 2023-05-24 – 2023-05-30 (×14): 625 mg via ORAL
  Filled 2023-05-24 (×15): qty 1

## 2023-05-24 MED ORDER — OXYCODONE HCL 5 MG PO TABS
5.0000 mg | ORAL_TABLET | Freq: Three times a day (TID) | ORAL | Status: DC | PRN
  Administered 2023-05-24 – 2023-05-25 (×2): 5 mg via ORAL
  Filled 2023-05-24 (×2): qty 1

## 2023-05-24 MED ORDER — ENSURE ENLIVE PO LIQD
237.0000 mL | Freq: Three times a day (TID) | ORAL | Status: DC
  Administered 2023-05-24 – 2023-05-29 (×11): 237 mL via ORAL

## 2023-05-24 MED ORDER — FERROUS SULFATE 325 (65 FE) MG PO TABS
325.0000 mg | ORAL_TABLET | Freq: Two times a day (BID) | ORAL | Status: DC
  Administered 2023-05-24 – 2023-05-30 (×13): 325 mg via ORAL
  Filled 2023-05-24 (×13): qty 1

## 2023-05-24 NOTE — Progress Notes (Signed)
 PROGRESS NOTE  Bradley Torres WUJ:[id number] DOB: 02/21/1961 DOA: 05/23/2023 PCP: Mechele Claude, MD   LOS: 1 day   Brief narrative:   Bradley Torres is a 63 y.o. male with medical history significant for hypertension, type 2 diabetes mellitus, OSA with CPAP intolerance, history of DVT and PE on Xarelto, PAF, chronic HFpEF, and recent admission with LLQ intra-abdominal abscess, colocutaneous fistula, and abdominal wall abscess presented to hospital with generalized weakness and malaise.  Of note patient had been admitted to the hospital 04/10/2023 and IR had a drain placed in the left lower quadrant abscess.  He was also seen by ID and was discharged on Bactrim and Augmentin.  For 3 to 4 days prior to this presentation patient started developing malaise weakness lack of appetite and lightheadedness.  In the ED patient was afebrile.  Labs showed creatinine elevation at 2.9 with albumin low at 2.6.  No leukocytosis.  Lactate was elevated at 3.1.  CT demonstrates percutaneous pigtail catheter in the left anterior abdominal wall with interval resolution/decrease in abscess at the catheter tip, and persistent air-fluid collection along the catheter tract concerning for abdominal wall abscess measuring up to 6.1 cm.  Blood cultures were drawn and patient received 1 L of IV fluids.  General surgery was consulted and patient was admitted hospital for further evaluation and treatment.     Assessment/Plan: Principal Problem:   Acute renal failure superimposed on stage 3 chronic kidney disease, unspecified acute renal failure type, unspecified whether stage 3a or 3b CKD (HCC) Active Problems:   Chronic heart failure with preserved ejection fraction (HFpEF) (HCC)   Severe obstructive sleep apnea-hypopnea syndrome   Sleep related hypoxia   Type 2 diabetes mellitus with hyperglycemia, without long-term current use of insulin (HCC)   Paroxysmal atrial fibrillation (HCC)   History of pulmonary embolus (PE)    Abdominal wall abscess    AKI superimposed on CKD 3a Creatinine on presentation was 2.9 from baseline around 1.4-1.5.  CT scan did not show any hydronephrosis.  Continue to hold nephrotoxic medication and, hold metformin and [place].  Continue IV fluid hydration.  Creatinine has slightly trended down to 2.2 today.   Abdominal wall abscess; intraabdominal abscess; colocutaneous fistula   Patient underwent IR guided drain exchange on 05/22/2024. continue Rocephin and Flagyl.  General surgery to follow the patient.  Dr. Derrell Lolling was notified.  Patient with significant amount of drainage and soakage  Paroxysmal atrial fibrillation. Patient is on Xarelto and amiodarone.   Chronic HFpEF  - Appears compensated at this time.  Continue to hold Farxiga    Hx of DVT/PE  Patient on Xarelto.   Type II DM  Hemoglobin A1c was 6.5% in January 2025.  Continue blood glucose levels, sliding scale insulin.  DVT prophylaxis:  rivaroxaban (XARELTO) tablet 20 mg   Disposition: Uncertain at this time.  Likely home with home health and few days.  Status is: Inpatient Remains inpatient appropriate because: IV antibiotic, continued drainage, pending clinical improvement, surgical evaluation    Code Status:     Code Status: Full Code  Family Communication: None at bedside  Consultants: General surgery Interventional radiology  Procedures: Exchange of drain catheter.  Anti-infectives:  Rocephin and Flagyl.  Anti-infectives (From admission, onward)    Start     Dose/Rate Route Frequency Ordered Stop   05/23/23 2315  cefTRIAXone (ROCEPHIN) 2 g in sodium chloride 0.9 % 100 mL IVPB        2 g 200 mL/hr over 30  Minutes Intravenous Every 24 hours 05/23/23 2305     05/23/23 2315  metroNIDAZOLE (FLAGYL) IVPB 500 mg        500 mg 100 mL/hr over 60 Minutes Intravenous Every 12 hours 05/23/23 2305          Subjective: Today, patient was seen and examined at bedside.  Patient complains of excessive  drainage and showcase at the drain tube site.  Denies overt pain, nausea, vomiting, fever or chills.  Has mild cough.  Objective: Vitals:   05/24/23 0604 05/24/23 0738  BP: 117/66 (!) 101/56  Pulse: 84   Resp:    Temp: 98 F (36.7 C) (!) 97.5 F (36.4 C)  SpO2: 98% 98%    Intake/Output Summary (Last 24 hours) at 05/24/2023 1030 Last data filed at 05/24/2023 0057 Gross per 24 hour  Intake 100 ml  Output --  Net 100 ml   There were no vitals filed for this visit. Body mass index is 46.79 kg/m.   Physical Exam:  GENERAL: Patient is alert awake and oriented. Not in obvious distress.  Obese build. HENT: No scleral pallor or icterus. Pupils equally reactive to light. Oral mucosa is moist NECK: is supple, no gross swelling noted. CHEST: Clear to auscultation. No crackles or wheezes.  Diminished breath sounds bilaterally. CVS: S1 and S2 heard, no murmur. Regular rate and rhythm.  ABDOMEN: Soft, obese abdomen, significant dressing with soakage and drain tube with hemorrhagic fluid.   EXTREMITIES: No edema. CNS: Cranial nerves are intact. No focal motor deficits. SKIN: warm and dry, abdominal dressing with soakage.  Data Review: I have personally reviewed the following laboratory data and studies,  CBC: Recent Labs  Lab 05/23/23 1639  WBC 9.9  HGB 11.6*  HCT 35.6*  MCV 95.2  PLT 425*   Basic Metabolic Panel: Recent Labs  Lab 05/23/23 1639 05/24/23 0644  NA 136 134*  K 4.6 3.6  CL 95* 101  CO2 24 22  GLUCOSE 160* 123*  BUN 61* 51*  CREATININE 2.90* 2.25*  CALCIUM 9.4 8.7*   Liver Function Tests: Recent Labs  Lab 05/23/23 1639  AST 24  ALT 13  ALKPHOS 64  BILITOT 0.9  PROT 7.4  ALBUMIN 2.6*   Recent Labs  Lab 05/23/23 1639  LIPASE 25   No results for input(s): "AMMONIA" in the last 168 hours. Cardiac Enzymes: No results for input(s): "CKTOTAL", "CKMB", "CKMBINDEX", "TROPONINI" in the last 168 hours. BNP (last 3 results) Recent Labs     04/28/23 0102 04/29/23 2204  BNP 30.3 71.9    ProBNP (last 3 results) No results for input(s): "PROBNP" in the last 8760 hours.  CBG: Recent Labs  Lab 05/24/23 0105 05/24/23 0734  GLUCAP 119* 130*   Recent Results (from the past 240 hours)  Blood culture (routine x 2)     Status: None (Preliminary result)   Collection Time: 05/23/23  4:39 PM   Specimen: BLOOD LEFT ARM  Result Value Ref Range Status   Specimen Description BLOOD LEFT ARM  Final   Special Requests   Final    BOTTLES DRAWN AEROBIC AND ANAEROBIC Blood Culture adequate volume   Culture   Final    NO GROWTH < 12 HOURS Performed at [facility], [address] [name], [place] [address]    Report Status PENDING  Incomplete  Resp panel by RT-PCR (RSV, Flu A&B, Covid)     Status: None   Collection Time: 05/23/23  4:39 PM   Specimen:  Nasal Swab  Result Value Ref Range Status   SARS Coronavirus 2 by RT PCR NEGATIVE NEGATIVE Final   Influenza A by PCR NEGATIVE NEGATIVE Final   Influenza B by PCR NEGATIVE NEGATIVE Final    Comment: (NOTE) The Xpert Xpress SARS-CoV-2/FLU/RSV plus assay is intended as an aid in the diagnosis of influenza from Nasopharyngeal swab specimens and should not be used as a sole basis for treatment. Nasal washings and aspirates are unacceptable for Xpert Xpress SARS-CoV-2/FLU/RSV testing.  Fact Sheet for Patients: BloggerCourse.com  Fact Sheet for Healthcare Providers: SeriousBroker.it  This test is not yet approved or cleared by the [place] FDA and has been authorized for detection and/or diagnosis of SARS-CoV-2 by FDA under an Emergency Use Authorization (EUA). This EUA will remain in effect (meaning this test can be used) for the duration of the COVID-19 declaration under Section 564(b)(1) of the Act, 21 U.S.C. section 360bbb-3(b)(1), unless the authorization is terminated or revoked.     Resp Syncytial Virus by PCR  NEGATIVE NEGATIVE Final    Comment: (NOTE) Fact Sheet for Patients: BloggerCourse.com  Fact Sheet for Healthcare Providers: SeriousBroker.it  This test is not yet approved or cleared by the [place] FDA and has been authorized for detection and/or diagnosis of SARS-CoV-2 by FDA under an Emergency Use Authorization (EUA). This EUA will remain in effect (meaning this test can be used) for the duration of the COVID-19 declaration under Section 564(b)(1) of the Act, 21 U.S.C. section 360bbb-3(b)(1), unless the authorization is terminated or revoked.  Performed at [facility], [address], [place], [place] [address]   Blood culture (routine x 2)     Status: None (Preliminary result)   Collection Time: 05/23/23  4:43 PM   Specimen: BLOOD RIGHT ARM  Result Value Ref Range Status   Specimen Description BLOOD RIGHT ARM  Final   Special Requests   Final    BOTTLES DRAWN AEROBIC AND ANAEROBIC Blood Culture adequate volume   Culture   Final    NO GROWTH < 12 HOURS Performed at [facility], [address] [place], [place] [address]    Report Status PENDING  Incomplete     Studies: CT ABDOMEN PELVIS WO CONTRAST Result Date: 05/23/2023 CLINICAL DATA:  Concern for intraabdominal abscess. EXAM: CT ABDOMEN AND PELVIS WITHOUT CONTRAST TECHNIQUE: Multidetector CT imaging of the abdomen and pelvis was performed following the standard protocol without IV contrast. RADIATION DOSE REDUCTION: This exam was performed according to the departmental dose-optimization program which includes automated exposure control, adjustment of the mA and/or kV according to patient size and/or use of iterative reconstruction technique. COMPARISON:  CT abdomen and pelvis 04/27/2023. FINDINGS: Lower chest: No acute abnormality. Hepatobiliary: Small gallstones are present. There is no biliary ductal dilatation gestation. Liver is within normal limits.  Pancreas: Unremarkable. No pancreatic ductal dilatation or surrounding inflammatory changes. Spleen: Normal in size without focal abnormality. Adrenals/Urinary Tract: There is a 4.5 cm low-density area in the superior pole of the left kidney, most likely a cyst. Otherwise, kidneys, adrenal glands and bladder are within normal limits. Stomach/Bowel: Stomach is within normal limits. Appendix appears normal. Focal small bowel wall thickening is seen adjacent to intraabdominal fluid collection in the left anterior abdomen without surrounding inflammation. There is no bowel obstruction or pneumatosis. There is sigmoid and descending colon diverticulosis. Vascular/Lymphatic: Aortic atherosclerosis. No enlarged abdominal or pelvic lymph nodes. Reproductive: Prostate is unremarkable. Other: There is a percutaneous pigtail catheter in the left  anterior abdominal wall with distal catheter tip within previously identified intraabdominal abscess. The abscess collection has resolved/decreased in the interval. There is a adjacent small bowel wall thickening in this region, likely reactive. Air-fluid collection in the deep subcu cutaneous tissues along the catheter tract persists measuring 2.6 by 3.0 x 6.1 cm. There is subcutaneous inflammatory stranding surrounding this collection. There is no ascites or free air. Musculoskeletal: L4-5 posterior fusion hardware is present. IMPRESSION: 1. Percutaneous pigtail catheter in the left anterior abdominal wall with distal catheter tip within previously identified intraabdominal abscess. The abscess collection has resolved/decreased in the interval. 2. Persistent air-fluid collection in the deep subcutaneous tissues along the catheter tract measuring 2.6 x 3.0 x 6.1 cm worrisome for abdominal wall abscess. 3. Adjacent small bowel wall thickening in this region is likely reactive. 4. Cholelithiasis. 5. Colonic diverticulosis. 6. Aortic atherosclerosis. Aortic Atherosclerosis (ICD10-I70.0).  Electronically Signed   By: Darliss Cheney M.D.   On: 05/23/2023 21:15   IR Catheter Tube Change Result Date: 05/23/2023 INDICATION: 63 year old male with diverticulitis history, prior abscess, as well as abdominal wall abscess. Prior drainage was performed 04/28/2023 of the intra-abdominal abscess with spontaneous decompression of the abdominal wall abscess. He presents feeling acutely ill, with decreasing drainage through the intra-abdominal drain and increasing drainage around the drain at the skin site. EXAM: IMAGE GUIDED DRAIN INJECTION AND EXCHANGE MEDICATIONS: The patient is currently receiving oral antibiotics. ANESTHESIA/SEDATION: None COMPLICATIONS: None PROCEDURE: Informed written consent was obtained from the patient after a thorough discussion of the procedural risks, benefits and alternatives. All questions were addressed. Maximal Sterile Barrier Technique was utilized including caps, mask, sterile gowns, sterile gloves, sterile drape, hand hygiene and skin antiseptic. A timeout was performed prior to the initiation of the procedure. Patient was positioned supine under the image intensifier. The drain in the skin in the left lower quadrant were prepped and draped in the usual sterile fashion. Scout images were acquired. The drain was injected recording images. Modified Seldinger technique was then used to exchange the drain for a new upsize 14 French pigtail drain catheter. 1% lidocaine was used for local anesthesia. The drain was sutured in position and attached to gravity drain. Patient tolerated the procedure well and remained hemodynamically stable throughout. No complications were encountered and no significant blood loss. FINDINGS: Abscess persists with partially occluded 12 French drain. 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 IMPRESSION: Status post image guided exchange and up size of left lower quadrant drain to a 14 French pigtail drain catheter, attached to gravity. Fistula confirmed. Signed, Yvone Neu.  Miachel Roux, RPVI [facility] Electronically Signed   By: Gilmer Mor D.O.   On: 05/23/2023 16:49      Joycelyn Das, MD  [facility] 05/24/2023  If 7PM-7AM, please contact night-coverage

## 2023-05-24 NOTE — Consult Note (Addendum)
 Cardiology asked to preoperatively evaluate patient once again for colonic fistula drainage.  We had seen patient 05/02/2023 for same issue so below will be a reiteration of of our evaluation and does not necessitate another formal consult.  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  Does not demonstrate any concerning symptoms for CAD progression, though METS difficult to assess for him.  He does have moderately reduced RV function, mildly elevated RVSP, 42 and refuses to wear his CPAP which also poses greater risk for anesthesia.  RCRI equals 2 indicating 10.1% risk of MACE.     His Lexiscan showed a small, moderate reversible perfusion defect consistent with ischemia in the LAD distribution, but was a poor study so could possibly represent artifact. It was felt since he had minimal disease in 2019 unlikely to have significant progression and given he has been asymptomatic we recommended medical therapy with no plans for cardiac catheterization.  Otherwise patient was at increased risk for upcoming procedure though acceptable.  For his anticoagulation would defer to general surgery on when this could be restarted.

## 2023-05-24 NOTE — Progress Notes (Signed)
 Wound instructions: 1. Cleanse skin around fistula and perc drain with soap and water. 2. Apply skin prep 3. Apply barrier ring Hart [place] # [phone]). 4. Cut medium Eakin pouch (LAWSON # [id number]) off center to accommodate perc drain. Pattern left at bedside. 5. If stool is too thick for spout, then cut off spout and apply pouch clip. Hart [place] # [phone])

## 2023-05-24 NOTE — Progress Notes (Signed)
 Mobility Specialist Progress Note:    05/24/23 1200  Mobility  Activity Stood at bedside;Dangled on edge of bed  Level of Assistance Standby assist, set-up cues, supervision of patient - no hands on  Assistive Device None  Activity Response Tolerated well  Mobility Referral Yes  Mobility visit 1 Mobility  Mobility Specialist Start Time (ACUTE ONLY) 1030  Mobility Specialist Stop Time (ACUTE ONLY) 1042  Mobility Specialist Time Calculation (min) (ACUTE ONLY) 12 min   Pt received in bed agreeable to mobility. Upon standing pt gown and linen were wet from drain site. Deferred further ambulation. Assisted pt in cleaning up, pt denies wanting to change gown. Left seated EOB w/ call bell and personal belongings in reach. RN aware.   Thompson Grayer Mobility Specialist  Please contact vis Secure Chat or  Rehab Office [phone]

## 2023-05-24 NOTE — Progress Notes (Signed)
 Progress Note     Subjective: Pt returned to the hospital for IR evaluation because he felt that drain was not functioning properly and he was just generally not feeling well. IR drain was exchanged and upsized yesterday and he reports has been draining around it significantly. He has had increased pain and poor PO intake with not feeling well. He has not been able to take in much ensure since leaving the hospital. He reports drainage from other site that was being controlled with pouch during last admission has slowed. He is still having BMs per rectum and reports last several were looser. He is feeling tired of all this and concerned that he if does not have surgery he will not be able to get better because he keeps getting sick again. We discussed that this remains not emergent but will review updated information and determine if surgery this admission is appropriate to consider. I did advise him that in the setting of poor nutrition and other co-morbidities that if he has surgery more acutely he may need to have a colostomy, he was not thrilled at this idea but repeatedly states that he just wants to get better.   Objective: Vital signs in last 24 hours: Temp:  [97.5 F (36.4 C)-98.5 F (36.9 C)] 97.5 F (36.4 C) (02/26 0738) Pulse Rate:  [84] 84 (02/26 0604) Resp:  [20] 20 (02/25 1619) BP: (101-117)/(56-70) 101/56 (02/26 0738) SpO2:  [98 %-100 %] 98 % (02/26 0738) Weight:  [134.4 kg] 134.4 kg (02/26 1106) Last BM Date : 05/24/23  Intake/Output from previous day: 02/25 0701 - 02/26 0700 In: 100 [IV Piggyback:100] Out: -  Intake/Output this shift: No intake/output data recorded.  PE: Gen:  Alert, NAD, obese, chronically ill appearing Abd:  soft, left sided abdominal ttp. Left IR drain with brown thick output, small opening adjacent to drain with no drainage, excoriated skin around drain   Lab Results:  Recent Labs    05/23/23 1639  WBC 9.9  HGB 11.6*  HCT 35.6*  PLT 425*    BMET Recent Labs    05/23/23 1639 05/24/23 0644  NA 136 134*  K 4.6 3.6  CL 95* 101  CO2 24 22  GLUCOSE 160* 123*  BUN 61* 51*  CREATININE 2.90* 2.25*  CALCIUM 9.4 8.7*   PT/INR No results for input(s): "LABPROT", "INR" in the last 72 hours. CMP     Component Value Date/Time   NA 134 (L) 05/24/2023 0644   NA 142 02/21/2023 1544   K 3.6 05/24/2023 0644   CL 101 05/24/2023 0644   CO2 22 05/24/2023 0644   GLUCOSE 123 (H) 05/24/2023 0644   BUN 51 (H) 05/24/2023 0644   BUN 18 02/21/2023 1544   CREATININE 2.25 (H) 05/24/2023 0644   CREATININE 1.50 (H) 05/11/2023 0300   CALCIUM 8.7 (L) 05/24/2023 0644   PROT 7.4 05/23/2023 1639   PROT 7.1 02/21/2023 1544   ALBUMIN 2.6 (L) 05/23/2023 1639   ALBUMIN 3.8 (L) 02/21/2023 1544   AST 24 05/23/2023 1639   ALT 13 05/23/2023 1639   ALKPHOS 64 05/23/2023 1639   BILITOT 0.9 05/23/2023 1639   BILITOT 0.3 02/21/2023 1544   GFRNONAA 32 (L) 05/24/2023 0644   GFRAA 68 12/16/2019 0000   Lipase     Component Value Date/Time   LIPASE 25 05/23/2023 1639       Studies/Results: CT ABDOMEN PELVIS WO CONTRAST Result Date: 05/23/2023 CLINICAL DATA:  Concern for intraabdominal abscess. EXAM: CT ABDOMEN  AND PELVIS WITHOUT CONTRAST TECHNIQUE: Multidetector CT imaging of the abdomen and pelvis was performed following the standard protocol without IV contrast. RADIATION DOSE REDUCTION: This exam was performed according to the departmental dose-optimization program which includes automated exposure control, adjustment of the mA and/or kV according to patient size and/or use of iterative reconstruction technique. COMPARISON:  CT abdomen and pelvis 04/27/2023. FINDINGS: Lower chest: No acute abnormality. Hepatobiliary: Small gallstones are present. There is no biliary ductal dilatation gestation. Liver is within normal limits. Pancreas: Unremarkable. No pancreatic ductal dilatation or surrounding inflammatory changes. Spleen: Normal in size without  focal abnormality. Adrenals/Urinary Tract: There is a 4.5 cm low-density area in the superior pole of the left kidney, most likely a cyst. Otherwise, kidneys, adrenal glands and bladder are within normal limits. Stomach/Bowel: Stomach is within normal limits. Appendix appears normal. Focal small bowel wall thickening is seen adjacent to intraabdominal fluid collection in the left anterior abdomen without surrounding inflammation. There is no bowel obstruction or pneumatosis. There is sigmoid and descending colon diverticulosis. Vascular/Lymphatic: Aortic atherosclerosis. No enlarged abdominal or pelvic lymph nodes. Reproductive: Prostate is unremarkable. Other: There is a percutaneous pigtail catheter in the left anterior abdominal wall with distal catheter tip within previously identified intraabdominal abscess. The abscess collection has resolved/decreased in the interval. There is a adjacent small bowel wall thickening in this region, likely reactive. Air-fluid collection in the deep subcu cutaneous tissues along the catheter tract persists measuring 2.6 by 3.0 x 6.1 cm. There is subcutaneous inflammatory stranding surrounding this collection. There is no ascites or free air. Musculoskeletal: L4-5 posterior fusion hardware is present. IMPRESSION: 1. Percutaneous pigtail catheter in the left anterior abdominal wall with distal catheter tip within previously identified intraabdominal abscess. The abscess collection has resolved/decreased in the interval. 2. Persistent air-fluid collection in the deep subcutaneous tissues along the catheter tract measuring 2.6 x 3.0 x 6.1 cm worrisome for abdominal wall abscess. 3. Adjacent small bowel wall thickening in this region is likely reactive. 4. Cholelithiasis. 5. Colonic diverticulosis. 6. Aortic atherosclerosis. Aortic Atherosclerosis (ICD10-I70.0). Electronically Signed   By: Darliss Cheney M.D.   On: 05/23/2023 21:15   IR Catheter Tube Change Result Date:  05/23/2023 INDICATION: 63 year old male with diverticulitis history, prior abscess, as well as abdominal wall abscess. Prior drainage was performed 04/28/2023 of the intra-abdominal abscess with spontaneous decompression of the abdominal wall abscess. He presents feeling acutely ill, with decreasing drainage through the intra-abdominal drain and increasing drainage around the drain at the skin site. EXAM: IMAGE GUIDED DRAIN INJECTION AND EXCHANGE MEDICATIONS: The patient is currently receiving oral antibiotics. ANESTHESIA/SEDATION: None COMPLICATIONS: None PROCEDURE: Informed written consent was obtained from the patient after a thorough discussion of the procedural risks, benefits and alternatives. All questions were addressed. Maximal Sterile Barrier Technique was utilized including caps, mask, sterile gowns, sterile gloves, sterile drape, hand hygiene and skin antiseptic. A timeout was performed prior to the initiation of the procedure. Patient was positioned supine under the image intensifier. The drain in the skin in the left lower quadrant were prepped and draped in the usual sterile fashion. Scout images were acquired. The drain was injected recording images. Modified Seldinger technique was then used to exchange the drain for a new upsize 14 French pigtail drain catheter. 1% lidocaine was used for local anesthesia. The drain was sutured in position and attached to gravity drain. Patient tolerated the procedure well and remained hemodynamically stable throughout. No complications were encountered and no significant blood loss. FINDINGS: Abscess persists  with partially occluded 12 [place] drain. 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 IMPRESSION: Status post image guided exchange and up size of left lower quadrant drain to a 14 French pigtail drain catheter, attached to gravity. Fistula confirmed. Signed, Yvone Neu. Miachel Roux, RPVI [facility] Electronically  Signed   By: Gilmer Mor D.O.   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 This has been going on for 8 months now. He has lost over 100 pounds in the last year. He still has significant comorbidities of a fib, DM II, CHF, h/o stroke, OSA, h/o PE 2019  - Cards saw last admission "Small area of distal anteroapical reversible ischemia - possibly artifact, but with some apical hypokinesis and LVEF 49%, may be ischemia - if so, could be distal LAD".  Recommended for medical therapy with No cath this time.  Can hold Eliquis 2 days prior to surgery.  - would recommend to go ahead and have cardiology see for pre-op risk stratification and assistance to optimize as much as possible from a cardiac standpoint - he was scheduled to see them on 3/6 - Abx per ID - Drain per IR - placed 1/31 and now upsized 2/25  - I do not think this will fully resolve without surgery but this has been going on for almost a year in its entirety. He is hemodynamically stable and does not have significant peritonitis. There is no indication for emergent surgical intervention but will discuss if surgical intervention this admission is reasonable - would like to see updated nutrition labs - I ordered prealbumin for tomorrow AM - would recommend holding xarelto (last dose was 2/24), ok to have heparin gtt - WOC following, appreciate recs for care of skin around drain/fistula    FEN - HHD and ensure VTE - hold DOAC, ok for heparin  gtt ID - Rocephin/Flagyl   LOS: 1 day   I reviewed Consultant IR notes, hospitalist notes, last 24 h vitals and pain scores, last 48 h intake and output, last 24 h labs and trends, last 24 h imaging results, and previous hospitalization records .  This care required high  level of medical decision making.    Juliet Rude, [facility] Surgery 05/24/2023, 12:13 PM Please see Amion for pager number during day hours 7:00am-4:30pm

## 2023-05-24 NOTE — Progress Notes (Addendum)
   05/24/23 1208  TOC Brief Assessment  Insurance and Status Reviewed  Patient has primary care physician Yes  Home environment has been reviewed alone brother lives next door  Prior level of function: independent  Prior/Current Home Services No current home services  Social Drivers of Health Review SDOH reviewed no interventions necessary  Transition of care needs no transition of care needs at this time    Confirmed face sheet information. Patient from home alone. Brother lives next door.   Patient was doing drain and wound care prior to admission  Transition of Care Department [facility]) has reviewed patient and no TOC needs have been identified at this time. We will continue to monitor patient advancement through interdisciplinary progression rounds. If new patient transition needs arise, please place a TOC consult.

## 2023-05-24 NOTE — Progress Notes (Signed)
 PHARMACY - ANTICOAGULATION CONSULT NOTE  Pharmacy Consult for Heparin Indication: atrial fibrillation  Allergies  Allergen Reactions   Aldactone [Spironolactone] Hives   Bystolic [Nebivolol Hcl] Swelling   Motrin [Ibuprofen] Other (See Comments)    Rectal bleed   Actos [Pioglitazone] Other (See Comments) and Cough    Dyspnea  Edema   Trulicity [Dulaglutide] Other (See Comments) and Cough    Shortness of breath GI intolerance   Zestril [Lisinopril] Other (See Comments) and Cough    Wheezing    Cozaar [Losartan] Rash   Diflucan [Fluconazole] Rash   Hctz [Hydrochlorothiazide] Other (See Comments)    Gout flares.   Norvasc [Amlodipine] Itching and Other (See Comments)    Myalgias     Patient Measurements: Height: 6' (182.9 cm) Weight: 134.4 kg (296 lb 3.2 oz) IBW/kg (Calculated) : 77.6 Heparin Dosing Weight: 115 kg  Vital Signs: Temp: 97.5 F (36.4 C) (02/26 0738) Temp Source: Axillary (02/26 0738) BP: 101/56 (02/26 0738) Pulse Rate: 84 (02/26 0604)  Labs: Recent Labs    05/23/23 1639 05/24/23 0644  HGB 11.6*  --   HCT 35.6*  --   PLT 425*  --   CREATININE 2.90* 2.25*    Estimated Creatinine Clearance: 48.9 mL/min (A) (by C-G formula based on SCr of 2.25 mg/dL (H)).   Medical History: Past Medical History:  Diagnosis Date   CAD (coronary artery disease)    a. 12/2017: cath showing 10% Proximal-LAD stenosis with no significant obstructive disease and LVEDP mildly elevated at 18 mm Hg.    Chronic back pain    Diabetes mellitus, type 2 (HCC)    DVT (deep venous thrombosis) (HCC)    Essential hypertension    Family hx of colon cancer 05/03/2017   MI (myocardial infarction) (HCC) 2019   Morbid obesity (HCC)    Panic disorder    Shoulder pain, left    Following fall   Sleep apnea    Stroke [facility]) 01/2018   Vitamin D deficiency     Assessment: 63 year old male with PMH significant for atrial fibrillation and hx of VTE on Xarelto PTA who is admitted for  evaluation of abdominal wall abscess. Pharmacy consulted to manage IV heparin while Xarelto is on hold.  Last dose Xarelto reported 2/25. Will use aPTT to adjust heparin.  Goal of Therapy:  Heparin level 0.3-0.7 units/ml aPTT 66-102 seconds Monitor platelets by anticoagulation protocol: Yes   Plan:  Start heparin infusion at 1700 units/hr Check heparin level in 6 hours and daily while on heparin Continue to monitor H&H and platelets   Thank you for allowing pharmacy to be a part of this patient's care.  Thelma Barge, PharmD Clinical Pharmacist

## 2023-05-24 NOTE — Hospital Course (Addendum)
 62 yom w/ HTN T2DM-on metformin/Farxiga/Januvia, OSA with CPAP intolerance, history of DVT and PE on Xarelto, PAF-on amiodarone, chronic HFpEF, and recent admission with LLQ intra-abdominal abscess, colocutaneous fistula, and abdominal wall abscess last admit 04/10/2023 and IR had a drain placed in the left lower quadrant abscess-seen by ID and was discharged on Bactrim and Augmentin, presented to hospital with generalized weakness and malaise on 05/23/23 with malaise weakness lack of appetite lightheadedness, and admitted for AKI on CKD, lactic acidosis and abdominal wall abscess/intra-abdominal abscess and colocutaneous fistula. Patient remains overall stable [place] for skilled nursing facility Patient was transfused 1 unit PRBC due to symptomatic anemia with hemoglobin 7.1 on 3/14, SNF has been approved and is stable for discharge 3/15  Significant procedures/imaging: 05/23/23>> CT abd>>percutaneous pigtail catheter in the left anterior abdominal wall with interval resolution/decrease in abscess at the catheter tip, and persistent air-fluid collection along the catheter tract concerning for abdominal wall abscess measuring up to 6.1 cm.  05/30/23>> Ex lap, open sigmoid colectomy, colostomy creation, small bowel resection, left colectomy, debridement of abdominal wall, VAC placement -Dr Dwain Sarna.  Consultation: PCCM/critical care General Surgery Cardiology for preop evaluation Wound care

## 2023-05-24 NOTE — Consult Note (Signed)
 WOC Nurse Consult Note: Reason for Consult: Colocutaneous fistula to midline abdomen with LLQ percutaneous drain in place.  New abscess formations noted.  Wound type: infectious, inflammatory Pressure Injury POA:NA Measurement: stomatized fistula to midline abdominal incision  LLQ perc drain Wound EXB:MWUX moist stoma Drainage (amount, consistency, odor) moderate brown feculent effluent Periwound: red moist denuded skin around stoma and drain from exposure to effluent  Dressing procedure/placement/frequency: Topical treatment orders provided for patient and bedside nurses to perform: 1. Cleanse skin around fistula and perc drain with soap and water. 2. Apply skin prep 3. Apply barrier ring [place] # [phone]). 4. Cut medium [place]akin pouch (LAWSON # [id number]) off center to accommodate perc drain. Pattern left at bedside. 5. If stool is too thick for spout, then cut off spout and apply pouch clip. [place] # [phone])  Will not follow at this time.  Please re-consult if needed.  Mike Gip MSN, RN, [name]-BC CWON Wound, Ostomy, Continence Nurse [facility] [phone] Pager [phone]

## 2023-05-24 NOTE — ED Notes (Signed)
 Pt provided with diet sprite, and bag lunch with chicken salad sandwich and grapes.

## 2023-05-25 ENCOUNTER — Other Ambulatory Visit: Payer: Self-pay

## 2023-05-25 DIAGNOSIS — N179 Acute kidney failure, unspecified: Secondary | ICD-10-CM | POA: Diagnosis not present

## 2023-05-25 DIAGNOSIS — E119 Type 2 diabetes mellitus without complications: Secondary | ICD-10-CM | POA: Diagnosis not present

## 2023-05-25 DIAGNOSIS — N183 Chronic kidney disease, stage 3 unspecified: Secondary | ICD-10-CM | POA: Diagnosis not present

## 2023-05-25 DIAGNOSIS — K632 Fistula of intestine: Secondary | ICD-10-CM | POA: Diagnosis not present

## 2023-05-25 LAB — BASIC METABOLIC PANEL
Anion gap: 11 (ref 5–15)
BUN: 39 mg/dL — ABNORMAL HIGH (ref 8–23)
CO2: 27 mmol/L (ref 22–32)
Calcium: 9 mg/dL (ref 8.9–10.3)
Chloride: 98 mmol/L (ref 98–111)
Creatinine, Ser: 2.04 mg/dL — ABNORMAL HIGH (ref 0.61–1.24)
GFR, Estimated: 36 mL/min — ABNORMAL LOW (ref >60)
Glucose, Bld: 148 mg/dL — ABNORMAL HIGH (ref 70–99)
Potassium: 4.4 mmol/L (ref 3.5–5.1)
Sodium: 136 mmol/L (ref 135–145)

## 2023-05-25 LAB — CBC
HCT: 32.7 % — ABNORMAL LOW (ref 39.0–52.0)
Hemoglobin: 10.5 g/dL — ABNORMAL LOW (ref 13.0–17.0)
MCH: 30.4 pg (ref 26.0–34.0)
MCHC: 32.1 g/dL (ref 30.0–36.0)
MCV: 94.8 fL (ref 80.0–100.0)
Platelets: 393 10*3/uL (ref 150–400)
RBC: 3.45 MIL/uL — ABNORMAL LOW (ref 4.22–5.81)
RDW: 15.1 % (ref 11.5–15.5)
WBC: 5.9 10*3/uL (ref 4.0–10.5)
nRBC: 0 % (ref 0.0–0.2)

## 2023-05-25 LAB — GLUCOSE, CAPILLARY
Glucose-Capillary: 138 mg/dL — ABNORMAL HIGH (ref 70–99)
Glucose-Capillary: 159 mg/dL — ABNORMAL HIGH (ref 70–99)
Glucose-Capillary: 166 mg/dL — ABNORMAL HIGH (ref 70–99)
Glucose-Capillary: 186 mg/dL — ABNORMAL HIGH (ref 70–99)
Glucose-Capillary: 256 mg/dL — ABNORMAL HIGH (ref 70–99)

## 2023-05-25 LAB — MAGNESIUM: Magnesium: 2 mg/dL (ref 1.7–2.4)

## 2023-05-25 LAB — PREALBUMIN: Prealbumin: 15 mg/dL — ABNORMAL LOW (ref 18–38)

## 2023-05-25 LAB — HEPARIN LEVEL (UNFRACTIONATED): Heparin Unfractionated: 0.47 [IU]/mL (ref 0.30–0.70)

## 2023-05-25 LAB — APTT: aPTT: 35 s (ref 24–36)

## 2023-05-25 MED ORDER — THIAMINE HCL 100 MG/ML IJ SOLN: 100.0000 mg | INTRAMUSCULAR | Status: DC

## 2023-05-25 MED ORDER — SODIUM CHLORIDE 0.9% FLUSH: 10.0000 mL | INTRAVENOUS | Status: DC | PRN

## 2023-05-25 MED ORDER — OXYCODONE-ACETAMINOPHEN 5-325 MG PO TABS: 1.0000 | ORAL_TABLET | Freq: Three times a day (TID) | ORAL | Status: DC | PRN

## 2023-05-25 MED ORDER — CHLORHEXIDINE GLUCONATE CLOTH 2 % EX PADS
6.0000 | MEDICATED_PAD | Freq: Every day | CUTANEOUS | Status: DC
  Administered 2023-05-25 – 2023-06-02 (×9): 6 via TOPICAL

## 2023-05-25 MED ORDER — THIAMINE HCL 100 MG/ML IJ SOLN
100.0000 mg | Freq: Once | INTRAMUSCULAR | Status: AC
  Administered 2023-05-25: 100 mg via INTRAVENOUS
  Filled 2023-05-25: qty 2

## 2023-05-25 MED ORDER — TRACE MINERALS CU-MN-SE-ZN 300-55-60-3000 MCG/ML IV SOLN
INTRAVENOUS | Status: AC
  Filled 2023-05-25: qty 1000

## 2023-05-25 MED ORDER — OXYCODONE HCL 5 MG PO TABS
5.0000 mg | ORAL_TABLET | Freq: Four times a day (QID) | ORAL | Status: DC | PRN
  Administered 2023-05-25 – 2023-05-30 (×11): 5 mg via ORAL
  Filled 2023-05-25 (×12): qty 1

## 2023-05-25 MED ORDER — OXYCODONE HCL 5 MG PO TABS: 5.0000 mg | ORAL_TABLET | Freq: Four times a day (QID) | ORAL | Status: DC | PRN

## 2023-05-25 MED ORDER — FAT EMUL FISH OIL/PLANT BASED 20% (SMOFLIPID)IV EMUL
250.0000 mL | INTRAVENOUS | Status: AC
  Administered 2023-05-25: 250 mL via INTRAVENOUS
  Filled 2023-05-25: qty 250

## 2023-05-25 MED ORDER — CLINIMIX/DEXTROSE (8/10) 8 % IV SOLN: INTRAVENOUS | Status: DC

## 2023-05-25 MED ORDER — OXYCODONE-ACETAMINOPHEN 5-325 MG PO TABS
1.0000 | ORAL_TABLET | Freq: Four times a day (QID) | ORAL | Status: DC | PRN
  Administered 2023-05-25 – 2023-05-30 (×12): 1 via ORAL
  Filled 2023-05-25 (×12): qty 1

## 2023-05-25 MED ORDER — FAT EMUL FISH OIL/PLANT BASED 20% (SMOFLIPID)IV EMUL: 250.0000 mL | INTRAVENOUS | Status: DC

## 2023-05-25 NOTE — Progress Notes (Signed)
 Patients skin around drain LLQ is exoriated and brown drainage is draining around the tube in copius amounts. Cleansed area with soap and water and no sting skin barrier applied. Mike Gip , WOC contacted and will come to see pt. Supplies ordered for [employer] and ring.

## 2023-05-25 NOTE — Progress Notes (Signed)
 PHARMACY - ANTICOAGULATION CONSULT NOTE  Pharmacy Consult for Heparin Indication: atrial fibrillation  Allergies  Allergen Reactions   Aldactone [Spironolactone] Hives   Bystolic [Nebivolol Hcl] Swelling   Motrin [Ibuprofen] Other (See Comments)    Rectal bleed   Actos [Pioglitazone] Other (See Comments) and Cough    Dyspnea  Edema   Trulicity [Dulaglutide] Other (See Comments) and Cough    Shortness of breath GI intolerance   Zestril [Lisinopril] Other (See Comments) and Cough    Wheezing    Cozaar [Losartan] Rash   Diflucan [Fluconazole] Rash   Hctz [Hydrochlorothiazide] Other (See Comments)    Gout flares.   Norvasc [Amlodipine] Itching and Other (See Comments)    Myalgias     Patient Measurements: Height: 6' (182.9 cm) Weight: 134.4 kg (296 lb 3.2 oz) IBW/kg (Calculated) : 77.6 Heparin Dosing Weight: 110 kg  Vital Signs: Temp: 97.6 F (36.4 C) (02/26 2104) Temp Source: Oral (02/26 2104) BP: 98/63 (02/26 2104) Pulse Rate: 82 (02/26 2104)  Labs: Recent Labs    05/23/23 1639 05/24/23 0644 05/24/23 2326  HGB 11.6*  --  10.5*  HCT 35.6*  --  32.7*  PLT 425*  --  393  APTT  --   --  41*  HEPARINUNFRC  --   --  0.45  CREATININE 2.90* 2.25* 2.04*    Estimated Creatinine Clearance: 53.9 mL/min (A) (by C-G formula based on SCr of 2.04 mg/dL (H)).   Medical History: Past Medical History:  Diagnosis Date   CAD (coronary artery disease)    a. 12/2017: cath showing 10% Proximal-LAD stenosis with no significant obstructive disease and LVEDP mildly elevated at 18 mm Hg.    Chronic back pain    Diabetes mellitus, type 2 (HCC)    DVT (deep venous thrombosis) (HCC)    Essential hypertension    Family hx of colon cancer 05/03/2017   MI (myocardial infarction) (HCC) 2019   Morbid obesity (HCC)    Panic disorder    Shoulder pain, left    Following fall   Sleep apnea    Stroke (HCC) 01/2018   Vitamin D deficiency     Medications:  Scheduled:   allopurinol   100 mg Oral Daily   amiodarone  200 mg Oral Daily   atorvastatin  20 mg Oral Daily   feeding supplement  237 mL Oral TID BM   ferrous sulfate  325 mg Oral BID WC   insulin aspart  0-5 Units Subcutaneous QHS   insulin aspart  0-6 Units Subcutaneous TID WC   polycarbophil  625 mg Oral BID   sodium chloride flush  3 mL Intravenous Q12H    Assessment: 63 year old male with h/o atrial fibrillation and VTE, Xarelto on hold, for heparin    Goal of Therapy:  aPTT 66-102 sec  Heparin level 0.3-0.7 units/ml Monitor platelets by anticoagulation protocol: Yes   Plan:  Increase Heparin 1950 units/hr Follow-up am labs.  Eddie Candle 05/25/2023,12:09 AM

## 2023-05-25 NOTE — Progress Notes (Signed)
    I spoke with the patient again this morning to make sure that there has been on significant change in his clinical status since the consult earlier this month and the stress test done at that time for risk stratification.  At that time the comment from Dr. Rennis Golden was    "Personally reviewed his stress test - there is a small area of distal anteroapical reversible ischemia - possibly artifact, but with some apical hypokinesis and LVEF 49%, may be ischemia - if so, could be distal LAD. However, he had only very minimal proximal LAD stenosis in 2019 by cath - so unlikely he has had significant progression since then. Also, he is asymptomatic, albeit sedentary. Would recommend medical therapy for this (no cath at this time) - would be an increased, but acceptable risk for surgery when necessary. Continue amiodarone, atorvastatin, no aspirin given Eliquis. Can hold eliquis 2 days prior to surgery and restart after. "  Nothing has changes with this patient clinically and surgery is indicated.   No further testing such as cath would be likely to lead to any treatment that would clarify or reduce this risk.  His RCRI is 15% for major cardiovascular events based on his multiple comorbid conditions and I did speak with him about this.    He understands and accepts this risk.

## 2023-05-25 NOTE — Progress Notes (Signed)
 Progress Note     Subjective: Pt feeling overwhelmed this AM, requesting something to help acutely with anxiety. He has been struggling with leakage from around drain all night. WOC seeing this AM and trying to get a pouch around drain site to control better. He would like to proceed with surgery and does understand that he will likely need a colostomy.   Objective: Vital signs in last 24 hours: Temp:  [97.6 F (36.4 C)-97.7 F (36.5 C)] 97.7 F (36.5 C) (02/27 0542) Pulse Rate:  [66-82] 66 (02/27 0542) Resp:  [15-20] 15 (02/27 0542) BP: (94-113)/(58-72) 113/72 (02/27 0542) SpO2:  [96 %-97 %] 97 % (02/27 0542) Weight:  [134.4 kg] 134.4 kg (02/26 1106) Last BM Date : 05/24/23  Intake/Output from previous day: 02/26 0701 - 02/27 0700 In: 821.4 [P.O.:240; I.V.:182.5; IV Piggyback:398.9] Out: 1200 [Urine:1200] Intake/Output this shift: No intake/output data recorded.  PE: Gen:  Alert, NAD, obese, chronically ill appearing Abd:  soft, left sided abdominal ttp. Left IR drain with brown thin output, small opening adjacent to drain with no drainage, excoriated skin around drain   Lab Results:  Recent Labs    05/23/23 1639 05/24/23 2326  WBC 9.9 5.9  HGB 11.6* 10.5*  HCT 35.6* 32.7*  PLT 425* 393   BMET Recent Labs    05/24/23 0644 05/24/23 2326  NA 134* 136  K 3.6 4.4  CL 101 98  CO2 22 27  GLUCOSE 123* 148*  BUN 51* 39*  CREATININE 2.25* 2.04*  CALCIUM 8.7* 9.0   PT/INR No results for input(s): "LABPROT", "INR" in the last 72 hours. CMP     Component Value Date/Time   NA 136 05/24/2023 2326   NA 142 02/21/2023 1544   K 4.4 05/24/2023 2326   CL 98 05/24/2023 2326   CO2 27 05/24/2023 2326   GLUCOSE 148 (H) 05/24/2023 2326   BUN 39 (H) 05/24/2023 2326   BUN 18 02/21/2023 1544   CREATININE 2.04 (H) 05/24/2023 2326   CREATININE 1.50 (H) 05/11/2023 0300   CALCIUM 9.0 05/24/2023 2326   PROT 7.4 05/23/2023 1639   PROT 7.1 02/21/2023 1544   ALBUMIN 2.6 (L)  05/23/2023 1639   ALBUMIN 3.8 (L) 02/21/2023 1544   AST 24 05/23/2023 1639   ALT 13 05/23/2023 1639   ALKPHOS 64 05/23/2023 1639   BILITOT 0.9 05/23/2023 1639   BILITOT 0.3 02/21/2023 1544   GFRNONAA 36 (L) 05/24/2023 2326   GFRAA 68 12/16/2019 0000   Lipase     Component Value Date/Time   LIPASE 25 05/23/2023 1639       Studies/Results: CT ABDOMEN PELVIS WO CONTRAST Result Date: 05/23/2023 CLINICAL DATA:  Concern for intraabdominal abscess. EXAM: CT ABDOMEN AND PELVIS WITHOUT CONTRAST TECHNIQUE: Multidetector CT imaging of the abdomen and pelvis was performed following the standard protocol without IV contrast. RADIATION DOSE REDUCTION: This exam was performed according to the departmental dose-optimization program which includes automated exposure control, adjustment of the mA and/or kV according to patient size and/or use of iterative reconstruction technique. COMPARISON:  CT abdomen and pelvis 04/27/2023. FINDINGS: Lower chest: No acute abnormality. Hepatobiliary: Small gallstones are present. There is no biliary ductal dilatation gestation. Liver is within normal limits. Pancreas: Unremarkable. No pancreatic ductal dilatation or surrounding inflammatory changes. Spleen: Normal in size without focal abnormality. Adrenals/Urinary Tract: There is a 4.5 cm low-density area in the superior pole of the left kidney, most likely a cyst. Otherwise, kidneys, adrenal glands and bladder are within normal  limits. Stomach/Bowel: Stomach is within normal limits. Appendix appears normal. Focal small bowel wall thickening is seen adjacent to intraabdominal fluid collection in the left anterior abdomen without surrounding inflammation. There is no bowel obstruction or pneumatosis. There is sigmoid and descending colon diverticulosis. Vascular/Lymphatic: Aortic atherosclerosis. No enlarged abdominal or pelvic lymph nodes. Reproductive: Prostate is unremarkable. Other: There is a percutaneous pigtail catheter  in the left anterior abdominal wall with distal catheter tip within previously identified intraabdominal abscess. The abscess collection has resolved/decreased in the interval. There is a adjacent small bowel wall thickening in this region, likely reactive. Air-fluid collection in the deep subcu cutaneous tissues along the catheter tract persists measuring 2.6 by 3.0 x 6.1 cm. There is subcutaneous inflammatory stranding surrounding this collection. There is no ascites or free air. Musculoskeletal: L4-5 posterior fusion hardware is present. IMPRESSION: 1. Percutaneous pigtail catheter in the left anterior abdominal wall with distal catheter tip within previously identified intraabdominal abscess. The abscess collection has resolved/decreased in the interval. 2. Persistent air-fluid collection in the deep subcutaneous tissues along the catheter tract measuring 2.6 x 3.0 x 6.1 cm worrisome for abdominal wall abscess. 3. Adjacent small bowel wall thickening in this region is likely reactive. 4. Cholelithiasis. 5. Colonic diverticulosis. 6. Aortic atherosclerosis. Aortic Atherosclerosis (ICD10-I70.0). Electronically Signed   By: Darliss Cheney M.D.   On: 05/23/2023 21:15   IR Catheter Tube Change Result Date: 05/23/2023 INDICATION: 63 year old male with diverticulitis history, prior abscess, as well as abdominal wall abscess. Prior drainage was performed 04/28/2023 of the intra-abdominal abscess with spontaneous decompression of the abdominal wall abscess. He presents feeling acutely ill, with decreasing drainage through the intra-abdominal drain and increasing drainage around the drain at the skin site. EXAM: IMAGE GUIDED DRAIN INJECTION AND EXCHANGE MEDICATIONS: The patient is currently receiving oral antibiotics. ANESTHESIA/SEDATION: None COMPLICATIONS: None PROCEDURE: Informed written consent was obtained from the patient after a thorough discussion of the procedural risks, benefits and alternatives. All questions  were addressed. Maximal Sterile Barrier Technique was utilized including caps, mask, sterile gowns, sterile gloves, sterile drape, hand hygiene and skin antiseptic. A timeout was performed prior to the initiation of the procedure. Patient was positioned supine under the image intensifier. The drain in the skin in the left lower quadrant were prepped and draped in the usual sterile fashion. Scout images were acquired. The drain was injected recording images. Modified Seldinger technique was then used to exchange the drain for a new upsize 14 French pigtail drain catheter. 1% lidocaine was used for local anesthesia. The drain was sutured in position and attached to gravity drain. Patient tolerated the procedure well and remained hemodynamically stable throughout. No complications were encountered and no significant blood loss. FINDINGS: Abscess persists with partially occluded 12 French drain. 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 IMPRESSION: Status post image guided exchange and up size of left lower quadrant drain to a 14 French pigtail drain catheter, attached to gravity. Fistula confirmed. Signed, Yvone Neu. Miachel Roux, RPVI [facility] Electronically Signed   By: Gilmer Mor D.O.   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 This has been going on for 8 months now. He has lost over 100 pounds in the last year. He still has significant comorbidities of a  fib, DM II, CHF, h/o stroke, OSA, h/o PE 2019  - Cards saw last admission "Small area of distal anteroapical reversible ischemia - possibly artifact, but with some apical hypokinesis and LVEF 49%, may be ischemia - if so, could be distal LAD".  Recommended for medical therapy with No cath this time.  Can hold Eliquis 2 days prior to surgery.  - cards reviewed chart and no interventions or studies recommended prior to surgery  - Abx per ID - Drain per IR - placed 1/31 and now upsized 2/25  - Pt failing outpatient management at this point, Dr. Corliss Skains to discuss surgery with patient today and may potentially plan for tomorrow pending discussion  - prealbumin is 15 which is actually a little better from previous admission  - WOC following, appreciate recs for pouching around drain. Unable to mark for stoma right now but recommendation would be to place higher if able for better visualization for patient    FEN - HHD and ensure VTE - heparin gtt ID - Rocephin/Flagyl   LOS: 2 days   I reviewed Consultant IR notes, hospitalist notes, last 24 h vitals and pain scores, last 48 h intake and output, last 24 h labs and trends, last 24 h imaging results, and previous hospitalization records .  This care required high  level of medical decision making.    Juliet Rude, [facility] 05/25/2023, 8:54 AM Please see Amion for pager number during day hours 7:00am-4:30pm

## 2023-05-25 NOTE — Progress Notes (Signed)
 Contacted pharmacy and MD to notify hepatin has been paused until he has no site as shift has ended

## 2023-05-25 NOTE — Progress Notes (Addendum)
 PHARMACY - TOTAL PARENTERAL NUTRITION CONSULT NOTE   Indication:  chronic malnutrition chronic colocutaneous fistula/ subcutaneous abscess   Patient Measurements: Height: 6' (182.9 cm) Weight: 134.4 kg (296 lb 3.2 oz) IBW/kg (Calculated) : 77.6   Body mass index is 40.17 kg/m. Usual Weight: 152-155 kg (fluctuating over the past 6 months; 170 kg 04/2022) 05/24/23 standing weight 134 kg (confirmed with RN and patient)   Assessment:  63 yo male with with medical history significant for hypertension, type 2 diabetes mellitus, history of DVT/PE on Xarelto, PAF, HFpEF with a recent admission for LLQ intra-abdominal abscess, colocutaneous fistula, and abdominal wall abscess on 04/27/2023, had IR drain placed into the LLQ abscess and the intra-abdominal abscess aspirated on 1/31. Per chart review, this has been going on for 8 months resulting in significant weight loss. He returned to the ED with concerns that the drain in his abdomen was not functioning and now is in need of surgery with a possible colostomy. Pharmacy consulted to start TPN to optimize nutrition prior to surgery.  Glucose / Insulin: A1c 03/2023 6.5%; CBGs 119-256 on SSI Electrolytes: k 4.4, Na 136, Mg 2.0, others wnl Renal: AKI trending down Scr 2.9 > 2.04 (bsl ~1.5), BUN 39 Hepatic: LFTs wnl, Tbili 0.9, prealbumin 15 Intake / Output; MIVF: UOP 0.4 mL/kg/hr, drain output not measured  GI Imaging: none since TPN initiation GI Surgeries / Procedures: none since TPN initiation  Central access: (05/25/23) pending TPN start date: (05/25/23) pending  Nutritional Goals: Goal TPN rate is 83 mL/hr (provides 150g of protein and 2500 kcals per day)  RD Assessment:   Current Nutrition:  Full liquids  Plan:  Start Clinimix 8/10 formula at 42 mL/hr at 1800  Electrolytes in TPN: none Add standard MVI and trace elements to TPN Continue Sensitive q6h SSI and adjust as needed  Reduce MIVF to 10 mL/hr at 1800 Monitor TPN labs on  Mon/Thurs, daily while initiating TPN  Thank you for allowing pharmacy to be a part of this patient's care.  Thelma Barge, PharmD, BCPS Clinical Pharmacist

## 2023-05-25 NOTE — Consult Note (Signed)
 WOC Nurse Consult Note: Reason for Consult: Colocutaneous fistula to midline abdomen with LLQ percutaneous drain in place just 2cm to the left of the stomatized fistula Wound type: EC fistula and percutaneous drain Pressure Injury POA: NA Measurement: NA Wound bed: NA Drainage (amount, consistency, odor) feculent material; green/brown Periwound: denudation that extends right lateral aspect of the fistula 8cm, extends 3cm left lateral from the drain tube  Dressing procedure/placement/frequency: Crust denuded skin with ostomy powder or antifungal powder and no sting skin prep Use pattern in the room to cut new large Edwin Cap [place] # [phone]). Use ostomy barrier rings around fistula and drain [place] # [phone]),  Place large [place], red rubber catheter [place] # 40) to LWS at 50-60mmHG, to enter pouch through [place] spout.  Keep to [facility] as much as possible until surgery.  If patient to get up to [facility] or [facility] for bowel prep. Remind patient about suction or DC suction at furthest point in the tubing.   WOC nursing team to FU after ostomy creation for care needed and teaching.   Demia Viera [facility], CNS, [employer] [phone]

## 2023-05-25 NOTE — Progress Notes (Signed)
 Peripherally Inserted Central Catheter Placement  The IV Nurse has discussed with the patient and/or persons authorized to consent for the patient, the purpose of this procedure and the potential benefits and risks involved with this procedure.  The benefits include less needle sticks, lab draws from the catheter, and the patient may be discharged home with the catheter. Risks include, but not limited to, infection, bleeding, blood clot (thrombus formation), and puncture of an artery; nerve damage and irregular heartbeat and possibility to perform a PICC exchange if needed/ordered by physician.  Alternatives to this procedure were also discussed.  Bard Power PICC patient education guide, fact sheet on infection prevention and patient information card has been provided to patient /or left at bedside.    PICC Placement Documentation  PICC Double Lumen 05/25/23 Right Basilic 44 cm 1 cm (Active)  Indication for Insertion or Continuance of Line Administration of hyperosmolar/irritating solutions (i.e. TPN, Vancomycin, etc.) 05/25/23 1649  Exposed Catheter (cm) 1 cm 05/25/23 1649  Site Assessment Clean, Dry, Intact 05/25/23 1649  Lumen #1 Status Flushed;Saline locked;Blood return noted 05/25/23 1649  Lumen #2 Status Flushed;Saline locked;Blood return noted 05/25/23 1649  Dressing Type Transparent;Securing device 05/25/23 1649  Dressing Status Antimicrobial disc/dressing in place;Clean, Dry, Intact 05/25/23 1649  Line Care Connections checked and tightened 05/25/23 1649  Line Adjustment (NICU/IV Team Only) No 05/25/23 1649  Dressing Intervention New dressing;Adhesive placed at insertion site (IV team only) 05/25/23 1649  Dressing Change Due 06/01/23 05/25/23 1649       Maximino Greenland 05/25/2023, 4:51 PM

## 2023-05-25 NOTE — Plan of Care (Signed)
  Problem: Education: Goal: Ability to describe self-care measures that may prevent or decrease complications (Diabetes Survival Skills Education) will improve Outcome: Progressing   Problem: Health Behavior/Discharge Planning: Goal: Ability to identify and utilize available resources and services will improve Outcome: Progressing   Problem: Coping: Goal: Ability to adjust to condition or change in health will improve Outcome: Not Progressing   Problem: Metabolic: Goal: Ability to maintain appropriate glucose levels will improve Outcome: Not Progressing   Problem: Nutritional: Goal: Maintenance of adequate nutrition will improve Outcome: Not Progressing Goal: Progress toward achieving an optimal weight will improve Outcome: Not Progressing   Problem: Skin Integrity: Goal: Risk for impaired skin integrity will decrease Outcome: Not Progressing   Problem: Clinical Measurements: Goal: Will remain free from infection Outcome: Not Progressing   Problem: Activity: Goal: Risk for activity intolerance will decrease Outcome: Not Progressing

## 2023-05-25 NOTE — Progress Notes (Signed)
 PHARMACY - ANTICOAGULATION CONSULT NOTE  Pharmacy Consult for Heparin Indication: atrial fibrillation  Allergies  Allergen Reactions   Aldactone [Spironolactone] Hives   Bystolic [Nebivolol Hcl] Swelling   Motrin [Ibuprofen] Other (See Comments)    Rectal bleed   Actos [Pioglitazone] Other (See Comments) and Cough    Dyspnea  Edema   Trulicity [Dulaglutide] Other (See Comments) and Cough    Shortness of breath GI intolerance   Zestril [Lisinopril] Other (See Comments) and Cough    Wheezing    Cozaar [Losartan] Rash   Diflucan [Fluconazole] Rash   Hctz [Hydrochlorothiazide] Other (See Comments)    Gout flares.   Norvasc [Amlodipine] Itching and Other (See Comments)    Myalgias     Patient Measurements: Height: 6' (182.9 cm) Weight: 134.4 kg (296 lb 3.2 oz) IBW/kg (Calculated) : 77.6 Heparin Dosing Weight: 115 kg  Vital Signs: Temp: 98.2 F (36.8 C) (02/27 0905) Temp Source: Axillary (02/27 0905) BP: 108/62 (02/27 0905) Pulse Rate: 73 (02/27 0905)  Labs: Recent Labs    05/23/23 1639 05/24/23 0644 05/24/23 2326 05/25/23 0816  HGB 11.6*  --  10.5*  --   HCT 35.6*  --  32.7*  --   PLT 425*  --  393  --   APTT  --   --  41* 35  HEPARINUNFRC  --   --  0.45 0.47  CREATININE 2.90* 2.25* 2.04*  --     Estimated Creatinine Clearance: 53.9 mL/min (A) (by C-G formula based on SCr of 2.04 mg/dL (H)).   Medical History: Past Medical History:  Diagnosis Date   CAD (coronary artery disease)    a. 12/2017: cath showing 10% Proximal-LAD stenosis with no significant obstructive disease and LVEDP mildly elevated at 18 mm Hg.    Chronic back pain    Diabetes mellitus, type 2 (HCC)    DVT (deep venous thrombosis) (HCC)    Essential hypertension    Family hx of colon cancer 05/03/2017   MI (myocardial infarction) (HCC) 2019   Morbid obesity (HCC)    Panic disorder    Shoulder pain, left    Following fall   Sleep apnea    Stroke [facility]) 01/2018   Vitamin D deficiency      Assessment: 63 year old male with PMH significant for atrial fibrillation and hx of VTE on Xarelto PTA who is admitted for evaluation of abdominal wall abscess. Pharmacy consulted to manage IV heparin while Xarelto is on hold.  Last dose Xarelto reported 2/25. Will use aPTT to adjust heparin. aPTT subtherapeutic, no issues with infusion or signs of bleeding reported.  Goal of Therapy:  Heparin level 0.3-0.7 units/ml aPTT 66-102 seconds Monitor platelets by anticoagulation protocol: Yes   Plan:  Increase heparin infusion to 2100 units/hr Check aPTT in 6 hours and daily while on heparin Continue to monitor H&H and platelets   Thank you for allowing pharmacy to be a part of this patient's care.  Thelma Barge, PharmD Clinical Pharmacist

## 2023-05-25 NOTE — Progress Notes (Signed)
 PROGRESS NOTE  [name]:[id number] DOB: 12-Mar-1961 DOA: 05/23/2023 PCP: Mechele Claude, MD   LOS: 2 days   Brief narrative:   Bradley Torres is a 63 y.o. male with medical history significant for hypertension, type 2 diabetes mellitus, OSA with CPAP intolerance, history of DVT and PE on Xarelto, PAF, chronic HFpEF, and recent admission with LLQ intra-abdominal abscess, colocutaneous fistula, and abdominal wall abscess presented to hospital with generalized weakness and malaise.  Of note patient had been admitted to the hospital 04/10/2023 and IR had a drain placed in the left lower quadrant abscess.  He was also seen by ID and was discharged on Bactrim and Augmentin.  For 3 to 4 days prior to this presentation patient started developing malaise weakness lack of appetite and lightheadedness.  In the ED patient was afebrile.  Labs showed creatinine elevation at 2.9 with albumin low at 2.6.  No leukocytosis.  Lactate was elevated at 3.1.  CT demonstrates percutaneous pigtail catheter in the left anterior abdominal wall with interval resolution/decrease in abscess at the catheter tip, and persistent air-fluid collection along the catheter tract concerning for abdominal wall abscess measuring up to 6.1 cm.  Blood cultures were drawn and patient received 1 L of IV fluids.  General surgery was consulted and patient was admitted hospital for further evaluation and treatment.  05/25/2023: Patient seen.  No new complaints.  Discussed with the surgery team.  Surgery team plans to optimize patient prior to surgery.  TPN is being considered (we defer to the surgical team).     Assessment/Plan: Principal Problem:   Acute renal failure superimposed on stage 3 chronic kidney disease, unspecified acute renal failure type, unspecified whether stage 3a or 3b CKD (HCC) Active Problems:   Chronic heart failure with preserved ejection fraction (HFpEF) (HCC)   Severe obstructive sleep apnea-hypopnea syndrome    Sleep related hypoxia   Type 2 diabetes mellitus with hyperglycemia, without long-term current use of insulin (HCC)   Paroxysmal atrial fibrillation (HCC)   History of pulmonary embolus (PE)   Abdominal wall abscess    AKI superimposed on CKD 3a Creatinine on presentation was 2.9 from baseline around 1.4-1.5.  CT scan did not show any hydronephrosis.  Continue to hold nephrotoxic medication and, hold metformin and [place].  Continue IV fluid hydration.  Creatinine has slightly trended down to 2.2 today. 05/25/2023: AKI is likely multifactorial.  Repeat renal function in the morning.   Abdominal wall abscess; intraabdominal abscess; colocutaneous fistula   Patient underwent IR guided drain exchange on 05/22/2024. continue Rocephin and Flagyl.  General surgery to follow the patient.  Dr. Derrell Lolling was notified.  Patient with significant amount of drainage and soakage 05/25/2023: See above documentation.  Surgical team plans to optimize patient prior to surgery.  Paroxysmal atrial fibrillation. Patient is on Xarelto and amiodarone.   Chronic HFpEF  - Appears compensated at this time.  Continue to hold Farxiga    Hx of DVT/PE  Patient on Xarelto.   Type II DM  Hemoglobin A1c was 6.5% in January 2025.  Continue blood glucose levels, sliding scale insulin.  DVT prophylaxis:    Disposition: Uncertain at this time.  Likely home with home health and few days.  Status is: Inpatient Remains inpatient appropriate because: IV antibiotic, continued drainage, pending clinical improvement, surgical evaluation    Code Status:     Code Status: Full Code  Family Communication: None at bedside  Consultants: General surgery Interventional radiology  Procedures: Exchange of  drain catheter.  Anti-infectives:  Rocephin and Flagyl.  Anti-infectives (From admission, onward)    Start     Dose/Rate Route Frequency Ordered Stop   05/23/23 2315  cefTRIAXone (ROCEPHIN) 2 g in sodium chloride 0.9 %  100 mL IVPB        2 g 200 mL/hr over 30 Minutes Intravenous Every 24 hours 05/23/23 2305     05/23/23 2315  metroNIDAZOLE (FLAGYL) IVPB 500 mg        500 mg 100 mL/hr over 60 Minutes Intravenous Every 12 hours 05/23/23 2305          Subjective: -Patient seen. -No new complaints.  Objective: Vitals:   05/25/23 0542 05/25/23 0905  BP: 113/72 108/62  Pulse: 66 73  Resp: 15 16  Temp: 97.7 F (36.5 C) 98.2 F (36.8 C)  SpO2: 97% 94%    Intake/Output Summary (Last 24 hours) at 05/25/2023 1306 Last data filed at 05/25/2023 1219 Gross per 24 hour  Intake 1061.39 ml  Output 1400 ml  Net -338.61 ml   Filed Weights   05/24/23 1106  Weight: 134.4 kg   Body mass index is 40.17 kg/m.   Physical Exam:  GENERAL: Patient is morbidly obese.  Patient is not in any distress. HEENT: Patient is pale.  No jaundice. Neck: Supple.  JVD difficult to assess. CVS: S1-S2. Abdomen: Obese, soft and nontender.  Patient has a drain in the colostomy bag. Neuro: Awake and alert.  Moves all extremities. Extremities: Minimal ankle edema.   Data Review: I have personally reviewed the following laboratory data and studies,  CBC: Recent Labs  Lab 05/23/23 1639 05/24/23 2326  WBC 9.9 5.9  HGB 11.6* 10.5*  HCT 35.6* 32.7*  MCV 95.2 94.8  PLT 425* 393   Basic Metabolic Panel: Recent Labs  Lab 05/23/23 1639 05/24/23 0644 05/24/23 2326  NA 136 134* 136  K 4.6 3.6 4.4  CL 95* 101 98  CO2 24 22 27   GLUCOSE 160* 123* 148*  BUN 61* 51* 39*  CREATININE 2.90* 2.25* 2.04*  CALCIUM 9.4 8.7* 9.0  MG  --   --  2.0   Liver Function Tests: Recent Labs  Lab 05/23/23 1639  AST 24  ALT 13  ALKPHOS 64  BILITOT 0.9  PROT 7.4  ALBUMIN 2.6*   Recent Labs  Lab 05/23/23 1639  LIPASE 25   No results for input(s): "AMMONIA" in the last 168 hours. Cardiac Enzymes: No results for input(s): "CKTOTAL", "CKMB", "CKMBINDEX", "TROPONINI" in the last 168 hours. BNP (last 3 results) Recent  Labs    04/28/23 0102 04/29/23 2204  BNP 30.3 71.9    ProBNP (last 3 results) No results for input(s): "PROBNP" in the last 8760 hours.  CBG: Recent Labs  Lab 05/24/23 1640 05/24/23 2106 05/25/23 0036 05/25/23 0907 05/25/23 1218  GLUCAP 196* 182* 186* 159* 256*   Recent Results (from the past 240 hours)  Blood culture (routine x 2)     Status: None (Preliminary result)   Collection Time: 05/23/23  4:39 PM   Specimen: BLOOD LEFT ARM  Result Value Ref Range Status   Specimen Description BLOOD LEFT ARM  Final   Special Requests   Final    BOTTLES DRAWN AEROBIC AND ANAEROBIC Blood Culture adequate volume   Culture   Final    NO GROWTH 2 DAYS Performed at [facility], [address] [place], [place] [address]    Report Status PENDING  Incomplete  Resp panel by  RT-PCR (RSV, Flu A&B, Covid)     Status: None   Collection Time: 05/23/23  4:39 PM   Specimen: Nasal Swab  Result Value Ref Range Status   SARS Coronavirus 2 by RT PCR NEGATIVE NEGATIVE Final   Influenza A by PCR NEGATIVE NEGATIVE Final   Influenza B by PCR NEGATIVE NEGATIVE Final    Comment: (NOTE) The Xpert Xpress SARS-CoV-2/FLU/RSV plus assay is intended as an aid in the diagnosis of influenza from Nasopharyngeal swab specimens and should not be used as a sole basis for treatment. Nasal washings and aspirates are unacceptable for Xpert Xpress SARS-CoV-2/FLU/RSV testing.  Fact Sheet for Patients: BloggerCourse.com  Fact Sheet for Healthcare Providers: SeriousBroker.it  This test is not yet approved or cleared by the [place] FDA and has been authorized for detection and/or diagnosis of SARS-CoV-2 by FDA under an Emergency Use Authorization (EUA). This EUA will remain in effect (meaning this test can be used) for the duration of the COVID-19 declaration under Section 564(b)(1) of the Act, 21 U.S.C. section 360bbb-3(b)(1), unless the authorization  is terminated or revoked.     Resp Syncytial Virus by PCR NEGATIVE NEGATIVE Final    Comment: (NOTE) Fact Sheet for Patients: BloggerCourse.com  Fact Sheet for Healthcare Providers: SeriousBroker.it  This test is not yet approved or cleared by the [place] FDA and has been authorized for detection and/or diagnosis of SARS-CoV-2 by FDA under an Emergency Use Authorization (EUA). This EUA will remain in effect (meaning this test can be used) for the duration of the COVID-19 declaration under Section 564(b)(1) of the Act, 21 U.S.C. section 360bbb-3(b)(1), unless the authorization is terminated or revoked.  Performed at [facility], [address], [place], [place] [address]   Blood culture (routine x 2)     Status: None (Preliminary result)   Collection Time: 05/23/23  4:43 PM   Specimen: BLOOD RIGHT ARM  Result Value Ref Range Status   Specimen Description BLOOD RIGHT ARM  Final   Special Requests   Final    BOTTLES DRAWN AEROBIC AND ANAEROBIC Blood Culture adequate volume   Culture   Final    NO GROWTH 2 DAYS Performed at [facility], [address], [place], [place] [address]    Report Status PENDING  Incomplete     Studies: [place] EKG SITE RITE Result Date: 05/25/2023 If [facility] image not attached, placement could not be confirmed due to current cardiac rhythm.  CT ABDOMEN PELVIS WO CONTRAST Result Date: 05/23/2023 CLINICAL DATA:  Concern for intraabdominal abscess. EXAM: CT ABDOMEN AND PELVIS WITHOUT CONTRAST TECHNIQUE: Multidetector CT imaging of the abdomen and pelvis was performed following the standard protocol without IV contrast. RADIATION DOSE REDUCTION: This exam was performed according to the departmental dose-optimization program which includes automated exposure control, adjustment of the mA and/or kV according to patient size and/or use of iterative reconstruction technique. COMPARISON:  CT  abdomen and pelvis 04/27/2023. FINDINGS: Lower chest: No acute abnormality. Hepatobiliary: Small gallstones are present. There is no biliary ductal dilatation gestation. Liver is within normal limits. Pancreas: Unremarkable. No pancreatic ductal dilatation or surrounding inflammatory changes. Spleen: Normal in size without focal abnormality. Adrenals/Urinary Tract: There is a 4.5 cm low-density area in the superior pole of the left kidney, most likely a cyst. Otherwise, kidneys, adrenal glands and bladder are within normal limits. Stomach/Bowel: Stomach is within normal limits. Appendix appears normal. Focal small bowel wall thickening is seen adjacent to intraabdominal fluid collection in  the left anterior abdomen without surrounding inflammation. There is no bowel obstruction or pneumatosis. There is sigmoid and descending colon diverticulosis. Vascular/Lymphatic: Aortic atherosclerosis. No enlarged abdominal or pelvic lymph nodes. Reproductive: Prostate is unremarkable. Other: There is a percutaneous pigtail catheter in the left anterior abdominal wall with distal catheter tip within previously identified intraabdominal abscess. The abscess collection has resolved/decreased in the interval. There is a adjacent small bowel wall thickening in this region, likely reactive. Air-fluid collection in the deep subcu cutaneous tissues along the catheter tract persists measuring 2.6 by 3.0 x 6.1 cm. There is subcutaneous inflammatory stranding surrounding this collection. There is no ascites or free air. Musculoskeletal: L4-5 posterior fusion hardware is present. IMPRESSION: 1. Percutaneous pigtail catheter in the left anterior abdominal wall with distal catheter tip within previously identified intraabdominal abscess. The abscess collection has resolved/decreased in the interval. 2. Persistent air-fluid collection in the deep subcutaneous tissues along the catheter tract measuring 2.6 x 3.0 x 6.1 cm worrisome for  abdominal wall abscess. 3. Adjacent small bowel wall thickening in this region is likely reactive. 4. Cholelithiasis. 5. Colonic diverticulosis. 6. Aortic atherosclerosis. Aortic Atherosclerosis (ICD10-I70.0). Electronically Signed   By: Darliss Cheney M.D.   On: 05/23/2023 21:15   IR Catheter Tube Change Result Date: 05/23/2023 INDICATION: 63 year old male with diverticulitis history, prior abscess, as well as abdominal wall abscess. Prior drainage was performed 04/28/2023 of the intra-abdominal abscess with spontaneous decompression of the abdominal wall abscess. He presents feeling acutely ill, with decreasing drainage through the intra-abdominal drain and increasing drainage around the drain at the skin site. EXAM: IMAGE GUIDED DRAIN INJECTION AND EXCHANGE MEDICATIONS: The patient is currently receiving oral antibiotics. ANESTHESIA/SEDATION: None COMPLICATIONS: None PROCEDURE: Informed written consent was obtained from the patient after a thorough discussion of the procedural risks, benefits and alternatives. All questions were addressed. Maximal Sterile Barrier Technique was utilized including caps, mask, sterile gowns, sterile gloves, sterile drape, hand hygiene and skin antiseptic. A timeout was performed prior to the initiation of the procedure. Patient was positioned supine under the image intensifier. The drain in the skin in the left lower quadrant were prepped and draped in the usual sterile fashion. Scout images were acquired. The drain was injected recording images. Modified Seldinger technique was then used to exchange the drain for a new upsize 14 French pigtail drain catheter. 1% lidocaine was used for local anesthesia. The drain was sutured in position and attached to gravity drain. Patient tolerated the procedure well and remained hemodynamically stable throughout. No complications were encountered and no significant blood loss. FINDINGS: Abscess persists with partially occluded 12 French drain.  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 IMPRESSION: Status post image guided exchange and up size of left lower quadrant drain to a 14 French pigtail drain catheter, attached to gravity. Fistula confirmed. Signed, Yvone Neu. Miachel Roux, RPVI [facility] Radiology Electronically Signed   By: Gilmer Mor D.O.   On: 05/23/2023 16:49    Time spent: 35 minutes.   Barnetta Chapel, MD  [facility] 05/25/2023  If 7PM-7AM, please contact night-coverage

## 2023-05-25 NOTE — Progress Notes (Signed)
 Notified Liljana RN that heparin is out and needs new heparin bag. Patient has new PIV at this time. Tomasita Morrow, RN VAST

## 2023-05-25 NOTE — Progress Notes (Signed)
 Pt has no IV access IV team informed. Heparin drip is paused  until a new site is made

## 2023-05-26 DIAGNOSIS — N179 Acute kidney failure, unspecified: Secondary | ICD-10-CM | POA: Diagnosis not present

## 2023-05-26 DIAGNOSIS — E440 Moderate protein-calorie malnutrition: Secondary | ICD-10-CM | POA: Insufficient documentation

## 2023-05-26 DIAGNOSIS — K632 Fistula of intestine: Secondary | ICD-10-CM | POA: Diagnosis not present

## 2023-05-26 DIAGNOSIS — N183 Chronic kidney disease, stage 3 unspecified: Secondary | ICD-10-CM | POA: Diagnosis not present

## 2023-05-26 DIAGNOSIS — E119 Type 2 diabetes mellitus without complications: Secondary | ICD-10-CM | POA: Diagnosis not present

## 2023-05-26 LAB — APTT
aPTT: 51 s — ABNORMAL HIGH (ref 24–36)
aPTT: 28 s (ref 24–36)
aPTT: 68 s — ABNORMAL HIGH (ref 24–36)

## 2023-05-26 LAB — CBC
HCT: 31.2 % — ABNORMAL LOW (ref 39.0–52.0)
Hemoglobin: 10 g/dL — ABNORMAL LOW (ref 13.0–17.0)
MCH: 30.9 pg (ref 26.0–34.0)
MCHC: 32.1 g/dL (ref 30.0–36.0)
MCV: 96.3 fL (ref 80.0–100.0)
Platelets: 387 10*3/uL (ref 150–400)
RBC: 3.24 MIL/uL — ABNORMAL LOW (ref 4.22–5.81)
RDW: 14.7 % (ref 11.5–15.5)
WBC: 7.6 10*3/uL (ref 4.0–10.5)
nRBC: 0 % (ref 0.0–0.2)

## 2023-05-26 LAB — COMPREHENSIVE METABOLIC PANEL
ALT: 11 U/L (ref 0–44)
AST: 14 U/L — ABNORMAL LOW (ref 15–41)
Albumin: 2.3 g/dL — ABNORMAL LOW (ref 3.5–5.0)
Alkaline Phosphatase: 50 U/L (ref 38–126)
Anion gap: 12 (ref 5–15)
BUN: 29 mg/dL — ABNORMAL HIGH (ref 8–23)
CO2: 26 mmol/L (ref 22–32)
Calcium: 8.9 mg/dL (ref 8.9–10.3)
Chloride: 96 mmol/L — ABNORMAL LOW (ref 98–111)
Creatinine, Ser: 1.47 mg/dL — ABNORMAL HIGH (ref 0.61–1.24)
GFR, Estimated: 54 mL/min — ABNORMAL LOW (ref >60)
Glucose, Bld: 175 mg/dL — ABNORMAL HIGH (ref 70–99)
Potassium: 3.8 mmol/L (ref 3.5–5.1)
Sodium: 134 mmol/L — ABNORMAL LOW (ref 135–145)
Total Bilirubin: 0.3 mg/dL (ref 0.0–1.2)
Total Protein: 6.1 g/dL — ABNORMAL LOW (ref 6.5–8.1)

## 2023-05-26 LAB — MAGNESIUM: Magnesium: 1.8 mg/dL (ref 1.7–2.4)

## 2023-05-26 LAB — GLUCOSE, CAPILLARY
Glucose-Capillary: 169 mg/dL — ABNORMAL HIGH (ref 70–99)
Glucose-Capillary: 193 mg/dL — ABNORMAL HIGH (ref 70–99)
Glucose-Capillary: 136 mg/dL — ABNORMAL HIGH (ref 70–99)
Glucose-Capillary: 178 mg/dL — ABNORMAL HIGH (ref 70–99)

## 2023-05-26 LAB — HEPARIN LEVEL (UNFRACTIONATED): Heparin Unfractionated: 0.31 [IU]/mL (ref 0.30–0.70)

## 2023-05-26 LAB — PHOSPHORUS: Phosphorus: 3.7 mg/dL (ref 2.5–4.6)

## 2023-05-26 MED ORDER — POLYETHYLENE GLYCOL 3350 17 G PO PACK
17.0000 g | PACK | Freq: Every day | ORAL | Status: DC
  Administered 2023-05-26 – 2023-05-30 (×4): 17 g via ORAL
  Filled 2023-05-26 (×7): qty 1

## 2023-05-26 MED ORDER — SENNA 8.6 MG PO TABS
1.0000 | ORAL_TABLET | Freq: Every day | ORAL | Status: DC
  Administered 2023-05-26 – 2023-05-30 (×5): 8.6 mg via ORAL
  Filled 2023-05-26 (×5): qty 1

## 2023-05-26 MED ORDER — ADULT MULTIVITAMIN W/MINERALS CH
1.0000 | ORAL_TABLET | Freq: Every day | ORAL | Status: DC
  Administered 2023-05-26 – 2023-05-30 (×5): 1 via ORAL
  Filled 2023-05-26 (×5): qty 1

## 2023-05-26 MED ORDER — MAGNESIUM SULFATE 2 GM/50ML IV SOLN
2.0000 g | Freq: Once | INTRAVENOUS | Status: AC
  Administered 2023-05-26: 2 g via INTRAVENOUS
  Filled 2023-05-26: qty 50

## 2023-05-26 MED ORDER — CLINIMIX/DEXTROSE (8/10) 8 % IV SOLN
INTRAVENOUS | Status: AC
  Filled 2023-05-26 (×2): qty 2000

## 2023-05-26 MED ORDER — POTASSIUM CHLORIDE CRYS ER 20 MEQ PO TBCR
40.0000 meq | EXTENDED_RELEASE_TABLET | Freq: Once | ORAL | Status: AC
  Administered 2023-05-26: 40 meq via ORAL
  Filled 2023-05-26: qty 2

## 2023-05-26 NOTE — Progress Notes (Signed)
 PHARMACY - ANTICOAGULATION CONSULT NOTE  Pharmacy Consult for Heparin Indication: atrial fibrillation  Patient Measurements: Height: 6' (182.9 cm) Weight: (!) 142 kg (313 lb 0.9 oz) IBW/kg (Calculated) : 77.6 Heparin Dosing Weight: 115 kg  Vital Signs: Temp: 97.6 F (36.4 C) (02/28 1955) Temp Source: Oral (02/28 1955) BP: 116/63 (02/28 1955) Pulse Rate: 80 (02/28 1955)  Labs: Recent Labs    05/24/23 8295 05/24/23 2326 05/24/23 2326 05/25/23 0816 05/26/23 0306 05/26/23 1256 05/26/23 2155  HGB  --  10.5*  --   --  10.0*  --   --   HCT  --  32.7*  --   --  31.2*  --   --   PLT  --  393  --   --  387  --   --   APTT  --  41*   < > 35 51* 28 68*  HEPARINUNFRC  --  0.45  --  0.47 0.31  --   --   CREATININE 2.25* 2.04*  --   --  1.47*  --   --    < > = values in this interval not displayed.    Estimated Creatinine Clearance: 77.2 mL/min (A) (by C-G formula based on SCr of 1.47 mg/dL (H)).  Assessment: 63 year old male with PMH significant for atrial fibrillation and hx of VTE on Xarelto PTA who is admitted for evaluation of abdominal wall abscess. Pharmacy consulted to manage IV heparin while Xarelto is on hold.Last dose Xarelto reported 2/25.   APTT low end therapeutic at 68 seconds s/p rate increase to 2700 units/hr  Goal of Therapy:  Heparin level 0.3-0.7 units/ml aPTT 66-102 seconds Monitor platelets by anticoagulation protocol: Yes   Plan:  Increase heparin gtt to 2800 units/hr Daily aPTT/HL, CBC, s/s bleeding  Daylene Posey, PharmD, [facility] Clinical Pharmacist ED Pharmacist Phone # [phone] 05/26/2023 10:42 PM

## 2023-05-26 NOTE — Progress Notes (Signed)
 PHARMACY - ANTICOAGULATION CONSULT NOTE  Pharmacy Consult for Heparin Indication: atrial fibrillation  Patient Measurements: Height: 6' (182.9 cm) Weight: (!) 142 kg (313 lb 0.9 oz) IBW/kg (Calculated) : 77.6 Heparin Dosing Weight: 115 kg  Vital Signs: Temp: 98.4 F (36.9 C) (02/28 0811) Temp Source: Oral (02/28 0811) BP: 107/59 (02/28 0811) Pulse Rate: 79 (02/28 0811)  Labs: Recent Labs    05/23/23 1639 05/23/23 1639 05/24/23 1610 05/24/23 2326 05/25/23 0816 05/26/23 0306 05/26/23 1256  HGB 11.6*  --   --  10.5*  --  10.0*  --   HCT 35.6*  --   --  32.7*  --  31.2*  --   PLT 425*  --   --  393  --  387  --   APTT  --    < >  --  41* 35 51* 28  HEPARINUNFRC  --   --   --  0.45 0.47 0.31  --   CREATININE 2.90*  --  2.25* 2.04*  --  1.47*  --    < > = values in this interval not displayed.    Estimated Creatinine Clearance: 77.2 mL/min (A) (by C-G formula based on SCr of 1.47 mg/dL (H)).  Assessment: 63 year old male with PMH significant for atrial fibrillation and hx of VTE on Xarelto PTA who is admitted for evaluation of abdominal wall abscess. Pharmacy consulted to manage IV heparin while Xarelto is on hold.Last dose Xarelto reported 2/25.   APTT 28 is subtherapeutic on 2400units/hr.  No issues with infusion or bleeding per RN. Note heparin is running in PICC line and aPTT is being drawn from PICC line. Asked RN to hold heparin 5-10 min before lab draw.  Goal of Therapy:  Heparin level 0.3-0.7 units/ml aPTT 66-102 seconds Monitor platelets by anticoagulation protocol: Yes   Plan:  Increase heparin infusion to 2700 units/hr  F/u 8hr aptt Monitor with aPTT until correlates with heparin level Continue to monitor H&H and platelets, s/sx bleeding   Thank you for allowing pharmacy to be a part of this patient's care.  Alphia Moh, PharmD, BCPS, BCCP Clinical Pharmacist  Please check AMION for all [facility] Pharmacy phone numbers After 10:00 PM, call Main Pharmacy  [phone]

## 2023-05-26 NOTE — Progress Notes (Signed)
   [facility] will sign off.  If/when patient goes to surgery please call us with any cardiac questions.

## 2023-05-26 NOTE — Plan of Care (Signed)

## 2023-05-26 NOTE — Progress Notes (Signed)
 PHARMACY - ANTICOAGULATION CONSULT NOTE  Pharmacy Consult for Heparin Indication: atrial fibrillation  Patient Measurements: Height: 6' (182.9 cm) Weight: 134.4 kg (296 lb 3.2 oz) IBW/kg (Calculated) : 77.6 Heparin Dosing Weight: 115 kg  Vital Signs: Temp: 97.8 F (36.6 C) (02/27 2124) Temp Source: Oral (02/27 2124) BP: 120/96 (02/27 2124) Pulse Rate: 87 (02/27 2124)  Labs: Recent Labs    05/23/23 1639 05/24/23 0644 05/24/23 2326 05/25/23 0816 05/26/23 0306  HGB 11.6*  --  10.5*  --  10.0*  HCT 35.6*  --  32.7*  --  31.2*  PLT 425*  --  393  --  387  APTT  --   --  41* 35 51*  HEPARINUNFRC  --   --  0.45 0.47 0.31  CREATININE 2.90* 2.25* 2.04*  --   --     Estimated Creatinine Clearance: 53.9 mL/min (A) (by C-G formula based on SCr of 2.04 mg/dL (H)).  Assessment: 63 year old male with PMH significant for atrial fibrillation and hx of VTE on Xarelto PTA who is admitted for evaluation of abdominal wall abscess. Pharmacy consulted to manage IV heparin while Xarelto is on hold.Last dose Xarelto reported 2/25.   2/28 AM: aPTT subtherapeutic at 51 seconds and heparin level still falsely elevated on 2100 units/hr. No issues with infusion or signs of bleeding per RN. Hgb 10s, plts 387  Goal of Therapy:  Heparin level 0.3-0.7 units/ml aPTT 66-102 seconds Monitor platelets by anticoagulation protocol: Yes   Plan:  Increase heparin infusion to 2400 units/hr Check aPTT in 6 hours and daily while on heparin Monitor with aPTT until correlates with heparin level Continue to monitor H&H and platelets   Thank you for allowing pharmacy to be a part of this patient's care.  Arabella Merles, PharmD. Clinical Pharmacist 05/26/2023 3:46 AM

## 2023-05-26 NOTE — Progress Notes (Signed)
 Progress Note     Subjective: Pt got PICC yesterday evening and reports that has been better from a lab standpoint. Myna Bright is helping control drainage better but there was some leaking along tubing yesterday. He is feeling tied down with all the IV lines and suction tubing. Asking if he could do LMWH or SQH instead of heparin gtt. Understands goal of working on nutrition through the weekend and then checking nutritional labs again Monday.   Objective: Vital signs in last 24 hours: Temp:  [97.5 F (36.4 C)-98.4 F (36.9 C)] 98.4 F (36.9 C) (02/28 0811) Pulse Rate:  [69-87] 79 (02/28 0811) Resp:  [16-20] 16 (02/28 0811) BP: (99-120)/(58-96) 107/59 (02/28 0811) SpO2:  [87 %-100 %] 93 % (02/28 0811) Weight:  [142 kg] 142 kg (02/28 0441) Last BM Date : 05/25/23  Intake/Output from previous day: 02/27 0701 - 02/28 0700 In: 1798.4 [P.O.:480; I.V.:1118.4; IV Piggyback:200] Out: 1100 [Urine:900] Intake/Output this shift: Total I/O In: 100 [IV Piggyback:100] Out: 450 [Urine:450]  PE: Gen:  Alert, NAD, obese, chronically ill appearing Abd:  soft, left sided abdominal ttp. [place] to abdomen with thin brown drainage    Lab Results:  Recent Labs    05/24/23 2326 05/26/23 0306  WBC 5.9 7.6  HGB 10.5* 10.0*  HCT 32.7* 31.2*  PLT 393 387   BMET Recent Labs    05/24/23 2326 05/26/23 0306  NA 136 134*  K 4.4 3.8  CL 98 96*  CO2 27 26  GLUCOSE 148* 175*  BUN 39* 29*  CREATININE 2.04* 1.47*  CALCIUM 9.0 8.9   PT/INR No results for input(s): "LABPROT", "INR" in the last 72 hours. CMP     Component Value Date/Time   NA 134 (L) 05/26/2023 0306   NA 142 02/21/2023 1544   K 3.8 05/26/2023 0306   CL 96 (L) 05/26/2023 0306   CO2 26 05/26/2023 0306   GLUCOSE 175 (H) 05/26/2023 0306   BUN 29 (H) 05/26/2023 0306   BUN 18 02/21/2023 1544   CREATININE 1.47 (H) 05/26/2023 0306   CREATININE 1.50 (H) 05/11/2023 0300   CALCIUM 8.9 05/26/2023 0306   PROT 6.1 (L) 05/26/2023  0306   PROT 7.1 02/21/2023 1544   ALBUMIN 2.3 (L) 05/26/2023 0306   ALBUMIN 3.8 (L) 02/21/2023 1544   AST 14 (L) 05/26/2023 0306   ALT 11 05/26/2023 0306   ALKPHOS 50 05/26/2023 0306   BILITOT 0.3 05/26/2023 0306   BILITOT 0.3 02/21/2023 1544   GFRNONAA 54 (L) 05/26/2023 0306   GFRAA 68 12/16/2019 0000   Lipase     Component Value Date/Time   LIPASE 25 05/23/2023 1639       Studies/Results: [place] EKG SITE RITE Result Date: 05/25/2023 If Site Rite image not attached, placement could not be confirmed due to current cardiac rhythm.   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 This has been going on  for 8 months now. He has lost over 100 pounds in the last year. He still has significant comorbidities of a fib, DM II, CHF, h/o stroke, OSA, h/o PE 2019  - Cards saw last admission "Small area of distal anteroapical reversible ischemia - possibly artifact, but with some apical hypokinesis and LVEF 49%, may be ischemia - if so, could be distal LAD".  Recommended for medical therapy with No cath this time.  Can hold Eliquis 2 days prior to surgery.  - cards reviewed chart and no interventions or studies recommended prior to surgery  - Abx per ID - Drain per IR - placed 1/31 and now upsized 2/25  - Pt failing outpatient management at this point - prealbumin was 15 yesterday which is actually a little better from previous admission. Continue TPN and ensure through the weekend and recheck on Monday  - WOC following,  appreciate recs for pouching around drain. Unable to mark for stoma right now but recommendation would be to place higher if able for better visualization for patient  - plan to try to optimize nutrition and will re-evaluate surgical readiness early next week    FEN - HH Diet and ensure, TPN VTE - heparin gtt ID - Rocephin/Flagyl   LOS: 3 days   I reviewed Consultant Cardiology notes, hospitalist notes, last 24 h vitals and pain scores, last 48 h intake and output, last 24 h labs and trends, last 24 h imaging results, and previous hospitalization records .  This care required moderate level of medical decision making.    Juliet Rude, [facility] Surgery 05/26/2023, 8:49 AM Please see Amion for pager number during day hours 7:00am-4:30pm

## 2023-05-26 NOTE — Progress Notes (Signed)
 Pt started on new bag of hep drip rate at 17ml/hr. Pt denies any bleeding or signs of bleeding. Rate verified with charge RN.

## 2023-05-26 NOTE — Progress Notes (Addendum)
 Nutrition Follow-up  DOCUMENTATION CODES:   Morbid obesity, Non-severe (moderate) malnutrition in context of chronic illness  INTERVENTION:   TPN per pharmacy To meet 50-60% of estimated calorie needs (1200-1440 kcal) and 80-100% of estimated protein needs (128-160 gm) Encourage protein and PO intake  Double proteins with meals Ensure Enlive po TID, each supplement provides 350 kcal and 20 grams of protein. Daily weights   NUTRITION DIAGNOSIS:   Moderate Malnutrition related to chronic illness as evidenced by mild fat depletion, mild muscle depletion, energy intake < or equal to 75% for > or equal to 1 month.  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 Recently admitted 1/30-2/7 with abdominal pain found to have LLQ intra-abdominal abscess, feculent drainage, colocutaneous fistula. Discharged with drain and antibitoics with plan to optimize nutrition before surgery for management of fistula. Now presents with generalized weakness, malaise, loss of appetite. PMH of HTN, Morbid obesity, DVT, diverticulitis, CAD, T2DM, prior stroke (2019), PAF, chronic HFpEF, CKD 3.  Patient reports in the last year his intake has been poor, he has not been able to eat the amount he used to. 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 He typically only eats 2 meals per day. Wakes up around 2 PM where he has his first meal, does not typically eat breakfast. Usually eats hotdogs, hamburgers, pot roast and snacks on chips and vienna sausages. Drinks sweet tea with every meal and soda throughout the day. Since being discharged patient reports having fair PO intake that has decreased in the last 3-4 days  before admission. Now eating 100% of his meals and drinking 2 Ensures per day. Patient reports last BM to be 05/22/23.   Reviewed weight history and patient was 375 lbs 1 year ago. Patient reports he has lost 100 lbs in the last year. However based on his weight history he has had around a 45 lb weigh loss, 12% within the last year. Not clinically significant given timeframe. However patient's weight has been stable since 09/08/23 ranging from 330-340 lbs. Unsure if current weights are reliable, messaged RN to provide updated weight.   Patient qualifies for moderate malnutrition at this time due to mild muscle wasting and poor PO intake. However, depending on an updated weight from RN he may qualify as severe malnutrition.  TPN to start today. Since patient eating 100% of meals and taking supplements pharmacy to reduce calories but maintain protein goal via TPN.  General surgery trying to increase protein stores over the weekend and revaluate surgical readiness Monday. May need possible colectomy. RD was going to start calorie count today. Messaged General surgery PA who plans to keep TPN running as possible ileus after surgery.    Admit weight: 134.4 kg  Current weight: 296.2 kg    05/11/23 (!) 156.5 kg  05/03/23 (!) 153.4 kg  02/21/23 (!) 157.7 kg  02/07/23 (!) 152.9 kg  12/23/22 (!) 152.3 kg  11/01/22 (!) 149.7 kg  10/13/22 (!) 149.7 kg  10/01/22 (!) 151 kg  09/08/22 (!) 156.6 kg  04/28/2022 170.4 kg - Care Everywhere    Intake/Output Summary (Last 24 hours) at 05/26/2023 1311 Last data filed at 05/26/2023 1022 Gross per 24  hour  Intake 1838.85 ml  Output 1350 ml  Net 488.85 ml   Drains/Lines: 200 ml wound vac x 24 hours   Average Meal Intake: 2/27-2/27: 100% intake x 2 recorded meals  Nutritionally Relevant Medications: Scheduled Meds:  feeding supplement  237 mL Oral TID BM   ferrous sulfate  325 mg Oral BID WC   insulin aspart  0-5 Units Subcutaneous QHS   insulin aspart   0-6 Units Subcutaneous TID WC   multivitamin with minerals  1 tablet Oral Daily   Continuous Infusions:  cefTRIAXone (ROCEPHIN)  IV Stopped (05/25/23 2302)   heparin 2,400 Units/hr (05/26/23 0715)   liver oil-zinc oxide     metronidazole 500 mg (05/26/23 1049)   TPN (CLINIMIX) Adult without lytes 42 mL/hr at 05/26/23 1022   TPN (CLINIMIX) Adult without lytes     Labs Reviewed: Sodium 134, BUN 29, Creatinine 1.47, Albumin 2.3, Protein 6.1, CBG ranges from 138-256 mg/dL over the last 24 hours HgbA1c 6.5   NUTRITION - FOCUSED PHYSICAL EXAM:  Flowsheet Row Most Recent Value  Orbital Region No depletion  Upper Arm Region No depletion  Thoracic and Lumbar Region No depletion  Buccal Region No depletion  Temple Region Mild depletion  Clavicle Bone Region No depletion  Clavicle and Acromion Bone Region No depletion  Scapular Bone Region Unable to assess  Dorsal Hand No depletion  Patellar Region Mild depletion  Anterior Thigh Region Mild depletion  Posterior Calf Region Mild depletion  Edema (RD Assessment) None  Hair Reviewed  Eyes Reviewed  Mouth Reviewed  Skin Reviewed  Nails Reviewed       Diet Order:   Diet Order             Diet Heart Room service appropriate? Yes; Fluid consistency: Thin  Diet effective now                   EDUCATION NEEDS:   Education needs have been addressed  Skin:  Skin Assessment: Skin Integrity Issues: Skin Integrity Issues:: Wound VAC Wound Vac: Abdomen  Last BM:  05/25/2022  Height:   Ht Readings from Last 1 Encounters:  05/24/23 6' (1.829 m)    Weight:   Wt Readings from Last 1 Encounters:  05/26/23 (!) 142 kg    Ideal Body Weight:  80.9 kg  BMI:  Body mass index is 42.46 kg/m.  Estimated Nutritional Needs:   Kcal:  2400-2600 kcal  Protein:  160-180 gm  Fluid:  >2L/day  Elliot Dally, RD Registered Dietitian  See Amion for more information

## 2023-05-26 NOTE — Plan of Care (Signed)

## 2023-05-26 NOTE — Progress Notes (Signed)
 PROGRESS NOTE  MOSIE ANGUS Torres:[id number] DOB: [birth year] DOA: 05/23/2023 PCP: Mechele Claude, MD   LOS: 3 days   Brief narrative:   Bradley Torres is a 63 y.o. male with medical history significant for hypertension, type 2 diabetes mellitus, OSA with CPAP intolerance, history of DVT and PE on Xarelto, PAF, chronic HFpEF, and recent admission with LLQ intra-abdominal abscess, colocutaneous fistula, and abdominal wall abscess presented to hospital with generalized weakness and malaise.  Of note patient had been admitted to the hospital 04/10/2023 and IR had a drain placed in the left lower quadrant abscess.  He was also seen by ID and was discharged on Bactrim and Augmentin.  For 3 to 4 days prior to this presentation patient started developing malaise weakness lack of appetite and lightheadedness.  In the ED patient was afebrile.  Labs showed creatinine elevation at 2.9 with albumin low at 2.6.  No leukocytosis.  Lactate was elevated at 3.1.  CT demonstrates percutaneous pigtail catheter in the left anterior abdominal wall with interval resolution/decrease in abscess at the catheter tip, and persistent air-fluid collection along the catheter tract concerning for abdominal wall abscess measuring up to 6.1 cm.  Blood cultures were drawn and patient received 1 L of IV fluids.  General surgery was consulted and patient was admitted hospital for further evaluation and treatment.  05/25/2023: Patient seen.  No new complaints.  Discussed with the surgery team.  Surgery team plans to optimize patient prior to surgery.  TPN is being considered (we defer to the surgical team).  05/26/2023: Patient seen.  No new changes.  Patient is on TPN.  Surgery teams input is highly appreciated.  Surgery team to determine timing of surgery.     Assessment/Plan: Principal Problem:   Acute renal failure superimposed on stage 3 chronic kidney disease, unspecified acute renal failure type, unspecified whether stage 3a or 3b  CKD (HCC) Active Problems:   Chronic heart failure with preserved ejection fraction (HFpEF) (HCC)   Severe obstructive sleep apnea-hypopnea syndrome   Sleep related hypoxia   Type 2 diabetes mellitus with hyperglycemia, without long-term current use of insulin (HCC)   Paroxysmal atrial fibrillation (HCC)   History of pulmonary embolus (PE)   Abdominal wall abscess   Malnutrition of moderate degree    AKI superimposed on CKD 3a Creatinine on presentation was 2.9 from baseline around 1.4-1.5.  CT scan did not show any hydronephrosis.  Continue to hold nephrotoxic medication and, hold metformin and [place].  Continue IV fluid hydration.  Creatinine has slightly trended down to 2.2 today. 05/26/2023: AKI is likely multifactorial.  Repeat renal function in the morning.   Abdominal wall abscess; intraabdominal abscess; colocutaneous fistula   Patient underwent IR guided drain exchange on 05/22/2024. continue Rocephin and Flagyl.  General surgery to follow the patient.  Dr. Derrell Lolling was notified.  Patient with significant amount of drainage and soakage 05/26/2023: See above documentation.  Surgical team plans to optimize patient prior to surgery.  Paroxysmal atrial fibrillation. Patient is on Xarelto and amiodarone.   Chronic HFpEF  - Appears compensated at this time.  Continue to hold Farxiga    Hx of DVT/PE  Patient on Xarelto.   Type II DM  Hemoglobin A1c was 6.5% in January 2025.  Continue blood glucose levels, sliding scale insulin.  DVT prophylaxis:    Disposition: Uncertain at this time.  Likely home with home health and few days.  Status is: Inpatient Remains inpatient appropriate because: IV antibiotic, continued drainage,  pending clinical improvement, surgical evaluation    Code Status:     Code Status: Full Code  Family Communication: None at bedside  Consultants: General surgery Interventional radiology  Procedures: Exchange of drain  catheter.  Anti-infectives:  Rocephin and Flagyl.  Anti-infectives (From admission, onward)    Start     Dose/Rate Route Frequency Ordered Stop   05/23/23 2315  cefTRIAXone (ROCEPHIN) 2 g in sodium chloride 0.9 % 100 mL IVPB        2 g 200 mL/hr over 30 Minutes Intravenous Every 24 hours 05/23/23 2305     05/23/23 2315  metroNIDAZOLE (FLAGYL) IVPB 500 mg        500 mg 100 mL/hr over 60 Minutes Intravenous Every 12 hours 05/23/23 2305          Subjective: -Patient seen. -No new complaints.  Objective: Vitals:   05/26/23 1550 05/26/23 1955  BP: 118/63 116/63  Pulse: 74 80  Resp: 17 20  Temp: (!) 97.5 F (36.4 C) 97.6 F (36.4 C)  SpO2: 94% 98%    Intake/Output Summary (Last 24 hours) at 05/26/2023 2049 Last data filed at 05/26/2023 2010 Gross per 24 hour  Intake 2469.4 ml  Output 1300 ml  Net 1169.4 ml   Filed Weights   05/24/23 1106 05/26/23 0441  Weight: 134.4 kg (!) 142 kg   Body mass index is 42.46 kg/m.   Physical Exam:  GENERAL: Patient is morbidly obese.  Patient is not in any distress. HEENT: Patient is pale.  No jaundice. Neck: Supple.  JVD difficult to assess. CVS: S1-S2. Abdomen: Obese, soft and nontender.  Patient has a drain in the colostomy bag. Neuro: Awake and alert.  Moves all extremities. Extremities: Minimal ankle edema.   Data Review: I have personally reviewed the following laboratory data and studies,  CBC: Recent Labs  Lab 05/23/23 1639 05/24/23 2326 05/26/23 0306  WBC 9.9 5.9 7.6  HGB 11.6* 10.5* 10.0*  HCT 35.6* 32.7* 31.2*  MCV 95.2 94.8 96.3  PLT 425* 393 387   Basic Metabolic Panel: Recent Labs  Lab 05/23/23 1639 05/24/23 0644 05/24/23 2326 05/26/23 0306  NA 136 134* 136 134*  K 4.6 3.6 4.4 3.8  CL 95* 101 98 96*  CO2 24 22 27 26   GLUCOSE 160* 123* 148* 175*  BUN 61* 51* 39* 29*  CREATININE 2.90* 2.25* 2.04* 1.47*  CALCIUM 9.4 8.7* 9.0 8.9  MG  --   --  2.0 1.8  PHOS  --   --   --  3.7   Liver  Function Tests: Recent Labs  Lab 05/23/23 1639 05/26/23 0306  AST 24 14*  ALT 13 11  ALKPHOS 64 50  BILITOT 0.9 0.3  PROT 7.4 6.1*  ALBUMIN 2.6* 2.3*   Recent Labs  Lab 05/23/23 1639  LIPASE 25   No results for input(s): "AMMONIA" in the last 168 hours. Cardiac Enzymes: No results for input(s): "CKTOTAL", "CKMB", "CKMBINDEX", "TROPONINI" in the last 168 hours. BNP (last 3 results) Recent Labs    04/28/23 0102 04/29/23 2204  BNP 30.3 71.9    ProBNP (last 3 results) No results for input(s): "PROBNP" in the last 8760 hours.  CBG: Recent Labs  Lab 05/25/23 1715 05/25/23 2121 05/26/23 0807 05/26/23 1234 05/26/23 1721  GLUCAP 138* 166* 169* 193* 136*   Recent Results (from the past 240 hours)  Blood culture (routine x 2)     Status: None (Preliminary result)   Collection Time: 05/23/23  4:39 PM  Specimen: BLOOD LEFT ARM  Result Value Ref Range Status   Specimen Description BLOOD LEFT ARM  Final   Special Requests   Final    BOTTLES DRAWN AEROBIC AND ANAEROBIC Blood Culture adequate volume   Culture   Final    NO GROWTH 3 DAYS Performed at [facility], [address] [place], [place] [address]    Report Status PENDING  Incomplete  Resp panel by RT-PCR (RSV, Flu A&B, Covid)     Status: None   Collection Time: 05/23/23  4:39 PM   Specimen: Nasal Swab  Result Value Ref Range Status   SARS Coronavirus 2 by RT PCR NEGATIVE NEGATIVE Final   Influenza A by PCR NEGATIVE NEGATIVE Final   Influenza B by PCR NEGATIVE NEGATIVE Final    Comment: (NOTE) The Xpert Xpress SARS-CoV-2/FLU/RSV plus assay is intended as an aid in the diagnosis of influenza from Nasopharyngeal swab specimens and should not be used as a sole basis for treatment. Nasal washings and aspirates are unacceptable for Xpert Xpress SARS-CoV-2/FLU/RSV testing.  Fact Sheet for Patients: BloggerCourse.com  Fact Sheet for Healthcare  Providers: SeriousBroker.it  This test is not yet approved or cleared by the [place] FDA and has been authorized for detection and/or diagnosis of SARS-CoV-2 by FDA under an Emergency Use Authorization (EUA). This EUA will remain in effect (meaning this test can be used) for the duration of the COVID-19 declaration under Section 564(b)(1) of the Act, 21 U.S.C. section 360bbb-3(b)(1), unless the authorization is terminated or revoked.     Resp Syncytial Virus by PCR NEGATIVE NEGATIVE Final    Comment: (NOTE) Fact Sheet for Patients: BloggerCourse.com  Fact Sheet for Healthcare Providers: SeriousBroker.it  This test is not yet approved or cleared by the [place] FDA and has been authorized for detection and/or diagnosis of SARS-CoV-2 by FDA under an Emergency Use Authorization (EUA). This EUA will remain in effect (meaning this test can be used) for the duration of the COVID-19 declaration under Section 564(b)(1) of the Act, 21 U.S.C. section 360bbb-3(b)(1), unless the authorization is terminated or revoked.  Performed at [facility], [address] [place], [place] [address]   Blood culture (routine x 2)     Status: None (Preliminary result)   Collection Time: 05/23/23  4:43 PM   Specimen: BLOOD RIGHT ARM  Result Value Ref Range Status   Specimen Description BLOOD RIGHT ARM  Final   Special Requests   Final    BOTTLES DRAWN AEROBIC AND ANAEROBIC Blood Culture adequate volume   Culture   Final    NO GROWTH 3 DAYS Performed at [facility], [address] [place], [place] [address]    Report Status PENDING  Incomplete     Studies: [place] EKG SITE RITE Result Date: 05/25/2023 If [facility] image not attached, placement could not be confirmed due to current cardiac rhythm.   Time spent: 35 minutes.   Barnetta Chapel, MD  [facility] 05/26/2023  If 7PM-7AM, please  contact night-coverage

## 2023-05-26 NOTE — Progress Notes (Addendum)
 PHARMACY - TOTAL PARENTERAL NUTRITION CONSULT NOTE   Indication:  chronic malnutrition chronic colocutaneous fistula/ subcutaneous abscess   Patient Measurements: Height: 6' (182.9 cm) Weight: (!) 142 kg (313 lb 0.9 oz) IBW/kg (Calculated) : 77.6   Body mass index is 42.46 kg/m. Usual Weight: 152-155 kg (fluctuating over the past 6 months; 170 kg 04/2022) 05/24/23 standing weight 134 kg (confirmed with RN and patient)   Assessment:  63 yo male with with medical history significant for hypertension, type 2 diabetes mellitus, history of DVT/PE on Xarelto, PAF, HFpEF with a recent admission for LLQ intra-abdominal abscess, colocutaneous fistula, and abdominal wall abscess on 04/27/2023, had IR drain placed into the LLQ abscess and the intra-abdominal abscess aspirated on 1/31. He returned to the ED with concerns that the drain in his abdomen was not functioning and now is in need of surgery with a possible colostomy. Per chart review, this has been going on for 8 months resulting in about 30 lbs weight loss. Of note, bed weights are significantly higher than standing weights. Per surgery, allow patient to eat freely by mouth. Patient has been eating 100% of eggs, sausage, cereal, toast, orange juice and coffee for breakfast and spaghetti, [place] and gravy, green beans, creamed potatoes for lunch/dinner. As of 2/28, patient is constipated, last BM PTA. Pharmacy consulted to start TPN to optimize nutrition prior to surgery. Earliest surgery may be is week of 3/3.   Note, patient is a hard stick so labs are being drawn from PICC line.  Glucose / Insulin: A1c 03/2023 6.5%; BG 138-256 used 4 units SSI Electrolytes: Na 134, Cl 96, Mg 1.8others wnl Renal:  Scr 1.47 (BL ~1.5), BUN 29 Hepatic: Alk phos/ALT/Tbili wnl, AST slight low, prealbumin 15, albumin 2.3  Intake / Output; MIVF: UOP , drain 200 ml. LBM 2/24  GI Imaging:  2/25 CT: previously identified intraabdominal abscess decreased with drain,  air-fluid collection in tissue concerning for abd wall abscess  GI Surgeries / Procedures: none since TPN initiation  Central access: 05/25/23 PICC TPN start date: 05/25/23   Nutritional Goals: Goal Clinimix 8/10 at 37ml/hr = daily provides 160g AA and 1325 kcal/d  RD Assessment: discussed with RD 2/28, given significant PO intake, will meet 100% protein needs but do not need to meet full Kcal needs via Clinimix  Estimated Needs Total Energy Estimated Needs: 2400-2600 kcal Total Protein Estimated Needs: 160-180 gm Total Fluid Estimated Needs: >2L/day Current Nutrition:  TPN  2/27 FLD- ate 100% 2/28 heart diet - eating 100% of meals   Plan:  Clinimix 8/10 without lytes daily, provides 160g AA and 1325 kcal/d Will hold off on SMOF lipid given significant PO intake and no risk for EFAD  No standard MVI and trace elements to TPN, give as PO Give Mg 2g IV and PO Kcl 40 mEq Continue Sensitive q6h SSI and adjust as needed   Monitor TPN labs on Mon/Thurs, and PRN F/u surgery plans   Thank you for allowing pharmacy to be a part of this patient's care.  Alphia Moh, PharmD, BCPS, BCCP Clinical Pharmacist  Please check AMION for all [facility] Pharmacy phone numbers After 10:00 PM, call Main Pharmacy [phone]

## 2023-05-27 DIAGNOSIS — N183 Chronic kidney disease, stage 3 unspecified: Secondary | ICD-10-CM | POA: Diagnosis not present

## 2023-05-27 DIAGNOSIS — K632 Fistula of intestine: Secondary | ICD-10-CM | POA: Diagnosis not present

## 2023-05-27 DIAGNOSIS — N179 Acute kidney failure, unspecified: Secondary | ICD-10-CM | POA: Diagnosis not present

## 2023-05-27 LAB — BASIC METABOLIC PANEL
Anion gap: 9 (ref 5–15)
BUN: 31 mg/dL — ABNORMAL HIGH (ref 8–23)
CO2: 27 mmol/L (ref 22–32)
Calcium: 9 mg/dL (ref 8.9–10.3)
Chloride: 98 mmol/L (ref 98–111)
Creatinine, Ser: 1.27 mg/dL — ABNORMAL HIGH (ref 0.61–1.24)
GFR, Estimated: >60 mL/min (ref >60)
Glucose, Bld: 145 mg/dL — ABNORMAL HIGH (ref 70–99)
Potassium: 4.2 mmol/L (ref 3.5–5.1)
Sodium: 134 mmol/L — ABNORMAL LOW (ref 135–145)

## 2023-05-27 LAB — HEPARIN LEVEL (UNFRACTIONATED)
Heparin Unfractionated: 0.72 [IU]/mL — ABNORMAL HIGH (ref 0.30–0.70)
Heparin Unfractionated: 0.65 [IU]/mL (ref 0.30–0.70)

## 2023-05-27 LAB — CBC
HCT: 30.6 % — ABNORMAL LOW (ref 39.0–52.0)
Hemoglobin: 9.9 g/dL — ABNORMAL LOW (ref 13.0–17.0)
MCH: 31.1 pg (ref 26.0–34.0)
MCHC: 32.4 g/dL (ref 30.0–36.0)
MCV: 96.2 fL (ref 80.0–100.0)
Platelets: 380 10*3/uL (ref 150–400)
RBC: 3.18 MIL/uL — ABNORMAL LOW (ref 4.22–5.81)
RDW: 14.8 % (ref 11.5–15.5)
WBC: 7.4 10*3/uL (ref 4.0–10.5)
nRBC: 0 % (ref 0.0–0.2)

## 2023-05-27 LAB — MAGNESIUM: Magnesium: 2 mg/dL (ref 1.7–2.4)

## 2023-05-27 LAB — APTT: aPTT: 106 s — ABNORMAL HIGH (ref 24–36)

## 2023-05-27 LAB — GLUCOSE, CAPILLARY
Glucose-Capillary: 160 mg/dL — ABNORMAL HIGH (ref 70–99)
Glucose-Capillary: 201 mg/dL — ABNORMAL HIGH (ref 70–99)
Glucose-Capillary: 147 mg/dL — ABNORMAL HIGH (ref 70–99)

## 2023-05-27 LAB — PHOSPHORUS: Phosphorus: 3 mg/dL (ref 2.5–4.6)

## 2023-05-27 MED ORDER — CLINIMIX/DEXTROSE (8/10) 8 % IV SOLN
INTRAVENOUS | Status: AC
  Filled 2023-05-27: qty 2000

## 2023-05-27 MED ORDER — METRONIDAZOLE 500 MG PO TABS
500.0000 mg | ORAL_TABLET | Freq: Two times a day (BID) | ORAL | Status: DC
  Administered 2023-05-27 – 2023-05-30 (×8): 500 mg via ORAL
  Filled 2023-05-27 (×9): qty 1

## 2023-05-27 NOTE — Progress Notes (Signed)
 PHARMACY - ANTICOAGULATION CONSULT NOTE  Pharmacy Consult for Heparin Indication: atrial fibrillation  Patient Measurements: Height: 6' (182.9 cm) Weight: (!) 151.6 kg (334 lb 3.5 oz) IBW/kg (Calculated) : 77.6 Heparin Dosing Weight: 115 kg  Vital Signs: Temp: 97.7 F (36.5 C) (03/01 0838) Temp Source: Oral (03/01 0838) BP: 127/62 (03/01 0838) Pulse Rate: 77 (03/01 0838)  Labs: Recent Labs    05/24/23 2326 05/25/23 0816 05/26/23 0306 05/26/23 1256 05/26/23 2155 05/27/23 0423 05/27/23 1430  HGB 10.5*  --  10.0*  --   --  9.9*  --   HCT 32.7*  --  31.2*  --   --  30.6*  --   PLT 393  --  387  --   --  380  --   APTT 41*   < > 51* 28 68* 106*  --   HEPARINUNFRC 0.45   < > 0.31  --   --  0.72* 0.65  CREATININE 2.04*  --  1.47*  --   --  1.27*  --    < > = values in this interval not displayed.    Estimated Creatinine Clearance: 92.6 mL/min (A) (by C-G formula based on SCr of 1.27 mg/dL (H)).  Assessment: 63 year old male with PMH significant for atrial fibrillation and hx of VTE on Xarelto PTA who is admitted for evaluation of abdominal wall abscess. Pharmacy consulted to manage IV heparin while Xarelto is on hold.Last dose Xarelto reported 2/25.   Note heparin level is being drawn from PICC line where heparin is running with instructions to hold heparin 5 min prior to draw.  Heparin level came back therapeutic this PM. We will continue with the current rate and check in AM.  Goal of Therapy:  Heparin level 0.3-0.7 units/ml aPTT 66-102 seconds Monitor platelets by anticoagulation protocol: Yes   Plan:  Cont heparin gtt 2700 units/hr Daily heparin level, CBC, s/s bleeding  Ulyses Southward, PharmD, BCIDP, AAHIVP, CPP Infectious Disease Pharmacist 05/27/2023 3:28 PM

## 2023-05-27 NOTE — Plan of Care (Signed)

## 2023-05-27 NOTE — Progress Notes (Signed)
 PHARMACY - TOTAL PARENTERAL NUTRITION CONSULT NOTE   Indication:  chronic malnutrition chronic colocutaneous fistula/ subcutaneous abscess   Patient Measurements: Height: 6' (182.9 cm) Weight: (!) 151.6 kg (334 lb 3.5 oz) IBW/kg (Calculated) : 77.6   Body mass index is 45.33 kg/m. Usual Weight: 152-155 kg (fluctuating over the past 6 months; 170 kg 04/2022) 05/24/23 standing weight 134 kg (confirmed with RN and patient)   Assessment:  63 yo male with with medical history significant for hypertension, type 2 diabetes mellitus, history of DVT/PE on Xarelto, PAF, HFpEF with a recent admission for LLQ intra-abdominal abscess, colocutaneous fistula, and abdominal wall abscess on 04/27/2023, had IR drain placed into the LLQ abscess and the intra-abdominal abscess aspirated on 1/31. He returned to the ED with concerns that the drain in his abdomen was not functioning and now is in need of surgery with a possible colostomy. Per chart review, this has been going on for 8 months resulting in about 30 lbs weight loss. Of note, bed weights are significantly higher than standing weights. Per surgery, allow patient to eat freely by mouth. Patient has been eating 100% of eggs, sausage, cereal, toast, orange juice and coffee for breakfast and spaghetti, [place] and gravy, green beans, creamed potatoes for lunch/dinner. As of 2/28, patient is constipated, last BM PTA. Pharmacy consulted to start TPN to optimize nutrition prior to surgery. Earliest surgery may be is week of 3/3.   Note, patient is a hard stick so labs are being drawn from PICC line.  Glucose / Insulin: A1c 03/2023 6.5%; BG 136-193, used 2 units SSI/24 hrs Electrolytes: Na 134, K 4.2 (received 40 mEq PO), others wnl Renal:  Scr 1.27 (BL ~1.5), BUN 31 Hepatic: Alk phos/ALT/Tbili wnl, AST slight low, prealbumin 15, albumin 2.3 Intake / Output; MIVF: UOP 0.5 ml/kg/hr, drain 0 ml charted. LBM 2/24  GI Imaging:  2/25 CT: previously identified  intraabdominal abscess decreased with drain, air-fluid collection in tissue concerning for abd wall abscess  GI Surgeries / Procedures: none since TPN initiation  Central access: 05/25/23 PICC TPN start date: 05/25/23   Nutritional Goals: Goal Clinimix 8/10 at 74ml/hr = daily provides 160g AA and 1325 kcal/d  RD Assessment: discussed with RD 2/28, given significant PO intake, will meet 80- 100% protein needs and 50-60% of Kcal needs via Clinimix  Estimated Needs Total Energy Estimated Needs: 2400-2600 kcal Total Protein Estimated Needs: 160-180 gm Total Fluid Estimated Needs: >2L/day Current Nutrition:  TPN  2/27 FLD- ate 100% 2/28 heart diet - eating 100% of meals   Plan:  Clinimix 8/10 without lytes daily, provides 160g AA and 1325 kcal/d, meeting 100% of AA and 55% of kcal needs  Will hold off on SMOF lipid given significant PO intake and no risk for EFAD  No standard MVI and trace elements to TPN, give as PO  Stop Sensitive q6h SSI     Monitor TPN labs on Mon/Thurs, and PRN F/u surgery plans   Thank you for allowing pharmacy to be a part of this patient's care.  Alphia Moh, PharmD, BCPS, BCCP Clinical Pharmacist  Please check AMION for all [facility] Pharmacy phone numbers After 10:00 PM, call Main Pharmacy [phone]

## 2023-05-27 NOTE — Progress Notes (Signed)
 PHARMACY - ANTICOAGULATION CONSULT NOTE  Pharmacy Consult for Heparin Indication: atrial fibrillation  Patient Measurements: Height: 6' (182.9 cm) Weight: (!) 151.6 kg (334 lb 3.5 oz) IBW/kg (Calculated) : 77.6 Heparin Dosing Weight: 115 kg  Vital Signs: Temp: 97.5 F (36.4 C) (03/01 0423) Temp Source: Oral (03/01 0423) BP: 100/58 (03/01 0423) Pulse Rate: 73 (03/01 0423)  Labs: Recent Labs    05/24/23 2326 05/25/23 0816 05/26/23 0306 05/26/23 1256 05/26/23 2155 05/27/23 0423  HGB 10.5*  --  10.0*  --   --  9.9*  HCT 32.7*  --  31.2*  --   --  30.6*  PLT 393  --  387  --   --  380  APTT 41* 35 51* 28 68* 106*  HEPARINUNFRC 0.45 0.47 0.31  --   --  0.72*  CREATININE 2.04*  --  1.47*  --   --  1.27*    Estimated Creatinine Clearance: 92.6 mL/min (A) (by C-G formula based on SCr of 1.27 mg/dL (H)).  Assessment: 63 year old male with PMH significant for atrial fibrillation and hx of VTE on Xarelto PTA who is admitted for evaluation of abdominal wall abscess. Pharmacy consulted to manage IV heparin while Xarelto is on hold.Last dose Xarelto reported 2/25.   Note heparin level is being drawn from PICC line where heparin is running with instructions to hold heparin 5 min prior to draw.  Heparin level 0.72 is correlating with aPTT 106, which are both slightly supratherapeutic on 2800 units/hr.  No issues with infusion or bleeding per RN. Will follow heparin levels from here.   Goal of Therapy:  Heparin level 0.3-0.7 units/ml aPTT 66-102 seconds Monitor platelets by anticoagulation protocol: Yes   Plan:  Decrease heparin gtt to 2700 units/hr F/u 8hr heparin level  Daily heparin level, CBC, s/s bleeding  Alphia Moh, PharmD, BCPS, [facility] Clinical Pharmacist  Please check AMION for all [facility] Pharmacy phone numbers After 10:00 PM, call Main Pharmacy [phone]

## 2023-05-27 NOTE — Progress Notes (Signed)
 05/27/2023: PROGRESS NOTE  Bradley Torres:[id number] DOB: 07-09-1960 DOA: 05/23/2023 PCP: Mechele Claude, MD   LOS: 4 days   Brief narrative:   Bradley Torres is a 63 y.o. male with medical history significant for hypertension, type 2 diabetes mellitus, OSA with CPAP intolerance, history of DVT and PE on Xarelto, PAF, chronic HFpEF, and recent admission with LLQ intra-abdominal abscess, colocutaneous fistula, and abdominal wall abscess presented to hospital with generalized weakness and malaise.  Of note patient had been admitted to the hospital 04/10/2023 and IR had a drain placed in the left lower quadrant abscess.  He was also seen by ID and was discharged on Bactrim and Augmentin.  For 3 to 4 days prior to this presentation patient started developing malaise weakness lack of appetite and lightheadedness.  In the ED patient was afebrile.  Labs showed creatinine elevation at 2.9 with albumin low at 2.6.  No leukocytosis.  Lactate was elevated at 3.1.  CT demonstrates percutaneous pigtail catheter in the left anterior abdominal wall with interval resolution/decrease in abscess at the catheter tip, and persistent air-fluid collection along the catheter tract concerning for abdominal wall abscess measuring up to 6.1 cm.  Blood cultures were drawn and patient received 1 L of IV fluids.  General surgery was consulted and patient was admitted hospital for further evaluation and treatment.  05/25/2023: Patient seen.  No new complaints.  Discussed with the surgery team.  Surgery team plans to optimize patient prior to surgery.  TPN is being considered (we defer to the surgical team).  05/27/2023: Patient seen.  No new changes.  Patient is on TPN.  Surgery teams input is highly appreciated.  Surgery team to determine timing of surgery.     Assessment/Plan: Principal Problem:   Acute renal failure superimposed on stage 3 chronic kidney disease, unspecified acute renal failure type, unspecified whether stage  3a or 3b CKD (HCC) Active Problems:   Chronic heart failure with preserved ejection fraction (HFpEF) (HCC)   Severe obstructive sleep apnea-hypopnea syndrome   Sleep related hypoxia   Type 2 diabetes mellitus with hyperglycemia, without long-term current use of insulin (HCC)   Paroxysmal atrial fibrillation (HCC)   History of pulmonary embolus (PE)   Abdominal wall abscess   Malnutrition of moderate degree    AKI superimposed on CKD 3a Creatinine on presentation was 2.9 from baseline around 1.4-1.5.  CT scan did not show any hydronephrosis.  Continue to hold nephrotoxic medication and, hold metformin and [place].  Continue IV fluid hydration.  Creatinine has slightly trended down to 2.2 today. 05/26/2023: AKI is likely multifactorial.  Repeat renal function in the morning. 05/27/2023: Slowly resolving.  Serum creatinine of 1.7 today.   Abdominal wall abscess; intraabdominal abscess; colocutaneous fistula   Patient underwent IR guided drain exchange on 05/22/2024. continue Rocephin and Flagyl.  General surgery to follow the patient.  Dr. Derrell Lolling was notified.  Patient with significant amount of drainage and soakage 05/26/2023: See above documentation.  Surgical team plans to optimize patient prior to surgery.  Paroxysmal atrial fibrillation. Patient is on Xarelto and amiodarone.   Chronic HFpEF  - Appears compensated at this time.  Continue to hold Farxiga    Hx of DVT/PE  Patient on Xarelto.   Type II DM  Hemoglobin A1c was 6.5% in January 2025.  Continue blood glucose levels, sliding scale insulin.  DVT prophylaxis:    Disposition: Uncertain at this time.  Likely home with home health and few days.  Status  is: Inpatient Remains inpatient appropriate because: IV antibiotic, continued drainage, pending clinical improvement, surgical evaluation    Code Status:     Code Status: Full Code  Family Communication: None at bedside  Consultants: General surgery Interventional  radiology  Procedures: Exchange of drain catheter.  Anti-infectives:  Rocephin and Flagyl.  Anti-infectives (From admission, onward)    Start     Dose/Rate Route Frequency Ordered Stop   05/27/23 1000  metroNIDAZOLE (FLAGYL) tablet 500 mg        500 mg Oral Every 12 hours 05/27/23 0722     05/23/23 2315  cefTRIAXone (ROCEPHIN) 2 g in sodium chloride 0.9 % 100 mL IVPB        2 g 200 mL/hr over 30 Minutes Intravenous Every 24 hours 05/23/23 2305     05/23/23 2315  metroNIDAZOLE (FLAGYL) IVPB 500 mg  Status:  Discontinued        500 mg 100 mL/hr over 60 Minutes Intravenous Every 12 hours 05/23/23 2305 05/27/23 0722        Subjective: -Patient seen. -No new complaints.  Objective: Vitals:   05/27/23 0838 05/27/23 1713  BP: 127/62 139/83  Pulse: 77 89  Resp: 16 18  Temp: 97.7 F (36.5 C) 97.7 F (36.5 C)  SpO2: 97% 98%    Intake/Output Summary (Last 24 hours) at 05/27/2023 1738 Last data filed at 05/27/2023 1649 Gross per 24 hour  Intake 3838.68 ml  Output 2325 ml  Net 1513.68 ml   Filed Weights   05/24/23 1106 05/26/23 0441 05/27/23 0500  Weight: 134.4 kg (!) 142 kg (!) 151.6 kg   Body mass index is 45.33 kg/m.   Physical Exam:  GENERAL: Patient is morbidly obese.  Patient is not in any distress. HEENT: Patient is pale.  No jaundice. Neck: Supple.  JVD difficult to assess. CVS: S1-S2. Abdomen: Obese, soft and nontender.  Patient has a drain in the colostomy bag. Neuro: Awake and alert.  Moves all extremities. Extremities: Minimal ankle edema.   Data Review: I have personally reviewed the following laboratory data and studies,  CBC: Recent Labs  Lab 05/23/23 1639 05/24/23 2326 05/26/23 0306 05/27/23 0423  WBC 9.9 5.9 7.6 7.4  HGB 11.6* 10.5* 10.0* 9.9*  HCT 35.6* 32.7* 31.2* 30.6*  MCV 95.2 94.8 96.3 96.2  PLT 425* 393 387 380   Basic Metabolic Panel: Recent Labs  Lab 05/23/23 1639 05/24/23 0644 05/24/23 2326 05/26/23 0306 05/27/23 0423   NA 136 134* 136 134* 134*  K 4.6 3.6 4.4 3.8 4.2  CL 95* 101 98 96* 98  CO2 24 22 27 26 27   GLUCOSE 160* 123* 148* 175* 145*  BUN 61* 51* 39* 29* 31*  CREATININE 2.90* 2.25* 2.04* 1.47* 1.27*  CALCIUM 9.4 8.7* 9.0 8.9 9.0  MG  --   --  2.0 1.8 2.0  PHOS  --   --   --  3.7 3.0   Liver Function Tests: Recent Labs  Lab 05/23/23 1639 05/26/23 0306  AST 24 14*  ALT 13 11  ALKPHOS 64 50  BILITOT 0.9 0.3  PROT 7.4 6.1*  ALBUMIN 2.6* 2.3*   Recent Labs  Lab 05/23/23 1639  LIPASE 25   No results for input(s): "AMMONIA" in the last 168 hours. Cardiac Enzymes: No results for input(s): "CKTOTAL", "CKMB", "CKMBINDEX", "TROPONINI" in the last 168 hours. BNP (last 3 results) Recent Labs    04/28/23 0102 04/29/23 2204  BNP 30.3 71.9    ProBNP (last 3 results)  No results for input(s): "PROBNP" in the last 8760 hours.  CBG: Recent Labs  Lab 05/26/23 1721 05/26/23 2122 05/27/23 0832 05/27/23 1237 05/27/23 1714  GLUCAP 136* 178* 160* 201* 147*   Recent Results (from the past 240 hours)  Blood culture (routine x 2)     Status: None (Preliminary result)   Collection Time: 05/23/23  4:39 PM   Specimen: BLOOD LEFT ARM  Result Value Ref Range Status   Specimen Description BLOOD LEFT ARM  Final   Special Requests   Final    BOTTLES DRAWN AEROBIC AND ANAEROBIC Blood Culture adequate volume   Culture   Final    NO GROWTH 4 DAYS Performed at [facility], [address], [place], [place] [address]    Report Status PENDING  Incomplete  Resp panel by RT-PCR (RSV, Flu A&B, Covid)     Status: None   Collection Time: 05/23/23  4:39 PM   Specimen: Nasal Swab  Result Value Ref Range Status   SARS Coronavirus 2 by RT PCR NEGATIVE NEGATIVE Final   Influenza A by PCR NEGATIVE NEGATIVE Final   Influenza B by PCR NEGATIVE NEGATIVE Final    Comment: (NOTE) The Xpert Xpress SARS-CoV-2/FLU/RSV plus assay is intended as an aid in the diagnosis of influenza from Nasopharyngeal  swab specimens and should not be used as a sole basis for treatment. Nasal washings and aspirates are unacceptable for Xpert Xpress SARS-CoV-2/FLU/RSV testing.  Fact Sheet for Patients: BloggerCourse.com  Fact Sheet for Healthcare Providers: SeriousBroker.it  This test is not yet approved or cleared by the [place] FDA and has been authorized for detection and/or diagnosis of SARS-CoV-2 by FDA under an Emergency Use Authorization (EUA). This EUA will remain in effect (meaning this test can be used) for the duration of the COVID-19 declaration under Section 564(b)(1) of the Act, 21 U.S.C. section 360bbb-3(b)(1), unless the authorization is terminated or revoked.     Resp Syncytial Virus by PCR NEGATIVE NEGATIVE Final    Comment: (NOTE) Fact Sheet for Patients: BloggerCourse.com  Fact Sheet for Healthcare Providers: SeriousBroker.it  This test is not yet approved or cleared by the [place] FDA and has been authorized for detection and/or diagnosis of SARS-CoV-2 by FDA under an Emergency Use Authorization (EUA). This EUA will remain in effect (meaning this test can be used) for the duration of the COVID-19 declaration under Section 564(b)(1) of the Act, 21 U.S.C. section 360bbb-3(b)(1), unless the authorization is terminated or revoked.  Performed at [facility], [address], [place], [place] [address]   Blood culture (routine x 2)     Status: None (Preliminary result)   Collection Time: 05/23/23  4:43 PM   Specimen: BLOOD RIGHT ARM  Result Value Ref Range Status   Specimen Description BLOOD RIGHT ARM  Final   Special Requests   Final    BOTTLES DRAWN AEROBIC AND ANAEROBIC Blood Culture adequate volume   Culture   Final    NO GROWTH 4 DAYS Performed at [facility], [address] [place], [place] [address]    Report Status PENDING  Incomplete      Studies: No results found.   Time spent: 35 minutes.   Barnetta Chapel, MD  [facility] 05/27/2023  If 7PM-7AM, please contact night-coverage

## 2023-05-27 NOTE — Progress Notes (Signed)
 Progress Note     Subjective: Endorsing ongoing pain related to the drainage but reports that it is improving. Denies new complaints.   Objective: Vital signs in last 24 hours: Temp:  [97.5 F (36.4 C)-97.7 F (36.5 C)] 97.7 F (36.5 C) (03/01 0838) Pulse Rate:  [73-80] 77 (03/01 0838) Resp:  [16-20] 16 (03/01 0838) BP: (100-127)/(58-63) 127/62 (03/01 0838) SpO2:  [94 %-98 %] 97 % (03/01 0838) Weight:  [151.6 kg] 151.6 kg (03/01 0500) Last BM Date : 05/27/23  Intake/Output from previous day: 02/28 0701 - 03/01 0700 In: 1151 [P.O.:240; I.V.:709.2; IV Piggyback:201.8] Out: 1700 [Urine:1700] Intake/Output this shift: No intake/output data recorded.  PE: Gen:  Alert, NAD, obese, chronically ill appearing Abd:  soft, non-distended, wound manager in place with feculent output   Lab Results:  Recent Labs    05/26/23 0306 05/27/23 0423  WBC 7.6 7.4  HGB 10.0* 9.9*  HCT 31.2* 30.6*  PLT 387 380   BMET Recent Labs    05/26/23 0306 05/27/23 0423  NA 134* 134*  K 3.8 4.2  CL 96* 98  CO2 26 27  GLUCOSE 175* 145*  BUN 29* 31*  CREATININE 1.47* 1.27*  CALCIUM 8.9 9.0   PT/INR No results for input(s): "LABPROT", "INR" in the last 72 hours. CMP     Component Value Date/Time   NA 134 (L) 05/27/2023 0423   NA 142 02/21/2023 1544   K 4.2 05/27/2023 0423   CL 98 05/27/2023 0423   CO2 27 05/27/2023 0423   GLUCOSE 145 (H) 05/27/2023 0423   BUN 31 (H) 05/27/2023 0423   BUN 18 02/21/2023 1544   CREATININE 1.27 (H) 05/27/2023 0423   CREATININE 1.50 (H) 05/11/2023 0300   CALCIUM 9.0 05/27/2023 0423   PROT 6.1 (L) 05/26/2023 0306   PROT 7.1 02/21/2023 1544   ALBUMIN 2.3 (L) 05/26/2023 0306   ALBUMIN 3.8 (L) 02/21/2023 1544   AST 14 (L) 05/26/2023 0306   ALT 11 05/26/2023 0306   ALKPHOS 50 05/26/2023 0306   BILITOT 0.3 05/26/2023 0306   BILITOT 0.3 02/21/2023 1544   GFRNONAA >60 05/27/2023 0423   GFRAA 68 12/16/2019 0000   Lipase     Component Value  Date/Time   LIPASE 25 05/23/2023 1639       Studies/Results: [place] EKG SITE RITE Result Date: 05/25/2023 If Site Rite image not attached, placement could not be confirmed due to current cardiac rhythm.   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 This has been going on for 8 months now. He has lost over 100 pounds in the last year. He still has significant comorbidities of a fib, DM II, CHF, h/o stroke, OSA, h/o PE 2019  - Cards  saw last admission "Small area of distal anteroapical reversible ischemia - possibly artifact, but with some apical hypokinesis and LVEF 49%, may be ischemia - if so, could be distal LAD".  Recommended for medical therapy with No cath this time.  Can hold Eliquis 2 days prior to surgery.  - cards reviewed chart and no interventions or studies recommended prior to surgery  - Abx per ID - Drain per IR - placed 1/31 and now upsized 2/25  - Pt failing outpatient management at this point - prealbumin was 15 yesterday last week. Continue TPN and ensure through the weekend and recheck on Monday  - WOC following, appreciate recs for pouching around drain. Unable to mark for stoma right now but recommendation would be to place higher if able for better visualization for patient  - plan to try to optimize nutrition and will re-evaluate surgical readiness early next  week    FEN - HH Diet and ensure, TPN VTE - heparin gtt ID - Rocephin/Flagyl   LOS: 4 days   I reviewed Consultant Cardiology notes, hospitalist notes, last 24 h vitals and pain scores, last 48 h intake and output, last 24 h labs and trends, last 24 h imaging results, and previous hospitalization records .  This care required moderate level of medical decision making.    Moise Boring, MD [facility] 05/27/2023, 9:23 AM Please see Amion for pager number during day hours 7:00am-4:30pm

## 2023-05-28 DIAGNOSIS — N179 Acute kidney failure, unspecified: Secondary | ICD-10-CM | POA: Diagnosis not present

## 2023-05-28 DIAGNOSIS — N183 Chronic kidney disease, stage 3 unspecified: Secondary | ICD-10-CM | POA: Diagnosis not present

## 2023-05-28 DIAGNOSIS — K632 Fistula of intestine: Secondary | ICD-10-CM | POA: Diagnosis not present

## 2023-05-28 LAB — GLUCOSE, CAPILLARY
Glucose-Capillary: 154 mg/dL — ABNORMAL HIGH (ref 70–99)
Glucose-Capillary: 199 mg/dL — ABNORMAL HIGH (ref 70–99)
Glucose-Capillary: 154 mg/dL — ABNORMAL HIGH (ref 70–99)

## 2023-05-28 LAB — CBC
HCT: 32 % — ABNORMAL LOW (ref 39.0–52.0)
Hemoglobin: 10.2 g/dL — ABNORMAL LOW (ref 13.0–17.0)
MCH: 30.9 pg (ref 26.0–34.0)
MCHC: 31.9 g/dL (ref 30.0–36.0)
MCV: 97 fL (ref 80.0–100.0)
Platelets: 411 10*3/uL — ABNORMAL HIGH (ref 150–400)
RBC: 3.3 MIL/uL — ABNORMAL LOW (ref 4.22–5.81)
RDW: 14.6 % (ref 11.5–15.5)
WBC: 12.1 10*3/uL — ABNORMAL HIGH (ref 4.0–10.5)
nRBC: 0 % (ref 0.0–0.2)

## 2023-05-28 LAB — CULTURE, BLOOD (ROUTINE X 2)
Culture: NO GROWTH
Culture: NO GROWTH
Special Requests: ADEQUATE
Special Requests: ADEQUATE

## 2023-05-28 LAB — BASIC METABOLIC PANEL
Anion gap: 11 (ref 5–15)
BUN: 35 mg/dL — ABNORMAL HIGH (ref 8–23)
CO2: 25 mmol/L (ref 22–32)
Calcium: 9.1 mg/dL (ref 8.9–10.3)
Chloride: 98 mmol/L (ref 98–111)
Creatinine, Ser: 1.23 mg/dL (ref 0.61–1.24)
GFR, Estimated: >60 mL/min (ref >60)
Glucose, Bld: 178 mg/dL — ABNORMAL HIGH (ref 70–99)
Potassium: 4.3 mmol/L (ref 3.5–5.1)
Sodium: 134 mmol/L — ABNORMAL LOW (ref 135–145)

## 2023-05-28 LAB — HEPARIN LEVEL (UNFRACTIONATED): Heparin Unfractionated: 0.42 [IU]/mL (ref 0.30–0.70)

## 2023-05-28 MED ORDER — INSULIN ASPART 100 UNIT/ML IJ SOLN
0.0000 [IU] | Freq: Three times a day (TID) | INTRAMUSCULAR | Status: DC
  Administered 2023-05-28: 1 [IU] via SUBCUTANEOUS
  Administered 2023-05-28 – 2023-05-29 (×3): 2 [IU] via SUBCUTANEOUS
  Administered 2023-05-29 – 2023-05-30 (×3): 3 [IU] via SUBCUTANEOUS
  Administered 2023-05-30: 2 [IU] via SUBCUTANEOUS
  Administered 2023-05-30: 3 [IU] via SUBCUTANEOUS

## 2023-05-28 MED ORDER — CLINIMIX/DEXTROSE (8/10) 8 % IV SOLN
INTRAVENOUS | Status: AC
  Filled 2023-05-28 (×2): qty 2000

## 2023-05-28 NOTE — Plan of Care (Signed)

## 2023-05-28 NOTE — Progress Notes (Signed)
 PHARMACY - ANTICOAGULATION CONSULT NOTE  Pharmacy Consult for Heparin Indication: atrial fibrillation  Patient Measurements: Height: 6' (182.9 cm) Weight: (!) 151.6 kg (334 lb 3.5 oz) IBW/kg (Calculated) : 77.6 Heparin Dosing Weight: 115 kg  Vital Signs: Temp: 97.7 F (36.5 C) (03/02 0526) Temp Source: Oral (03/02 0526) BP: 143/67 (03/02 0526) Pulse Rate: 87 (03/02 0526)  Labs: Recent Labs    05/26/23 0306 05/26/23 1256 05/26/23 2155 05/27/23 0423 05/27/23 1430 05/28/23 0318  HGB 10.0*  --   --  9.9*  --  10.2*  HCT 31.2*  --   --  30.6*  --  32.0*  PLT 387  --   --  380  --  411*  APTT 51* 28 68* 106*  --   --   HEPARINUNFRC 0.31  --   --  0.72* 0.65 0.42  CREATININE 1.47*  --   --  1.27*  --  1.23    Estimated Creatinine Clearance: 95.6 mL/min (by C-G formula based on SCr of 1.23 mg/dL).  Assessment: 63 year old male with PMH significant for atrial fibrillation and hx of VTE on Xarelto PTA who is admitted for evaluation of abdominal wall abscess. Pharmacy consulted to manage IV heparin while Xarelto is on hold.Last dose Xarelto reported 2/25.   Note heparin level is being drawn from PICC line where heparin is running with instructions to hold heparin 5 min prior to draw.  Heparin level 0.42 is therapeutic on 2700 units/hr but trending down. CBC stable  Goal of Therapy:  Heparin level 0.3-0.7 units/ml aPTT 66-102 seconds Monitor platelets by anticoagulation protocol: Yes   Plan:  Increase heparin gtt to 2750 units/hr to keep in goal Daily heparin level, CBC, s/s bleeding F/u surgery plans   Alphia Moh, PharmD, BCPS, [facility] Clinical Pharmacist  Please check AMION for all [facility] Pharmacy phone numbers After 10:00 PM, call Main Pharmacy [phone]

## 2023-05-28 NOTE — Progress Notes (Addendum)
 PHARMACY - TOTAL PARENTERAL NUTRITION CONSULT NOTE   Indication:  chronic malnutrition chronic colocutaneous fistula/ subcutaneous abscess   Patient Measurements: Height: 6' (182.9 cm) Weight: (!) 151.6 kg (334 lb 3.5 oz) IBW/kg (Calculated) : 77.6   Body mass index is 45.33 kg/m. Usual Weight: 152-155 kg (fluctuating over the past 6 months; 170 kg 04/2022) 05/24/23 standing weight 134 kg (confirmed with RN and patient) 3/1 standing wt =150.2kg per RN   Assessment:  63 yo male with with medical history significant for hypertension, type 2 diabetes mellitus, history of DVT/PE on Xarelto, PAF, HFpEF with a recent admission for LLQ intra-abdominal abscess, colocutaneous fistula, and abdominal wall abscess on 04/27/2023, had IR drain placed into the LLQ abscess and the intra-abdominal abscess aspirated on 1/31. He returned to the ED with concerns that the drain in his abdomen was not functioning and now is in need of surgery with a possible colostomy. Per chart review, this has been going on for 8 months resulting in about 30 lbs weight loss. Of note, bed weights are significantly higher than standing weights. Per surgery, allow patient to eat freely by mouth. Patient has been eating 100% of eggs, sausage, cereal, toast, orange juice and coffee for breakfast and spaghetti, [place] and gravy, green beans, creamed potatoes for lunch/dinner. As of 2/28, patient is constipated, last BM PTA. Pharmacy consulted to start TPN to optimize nutrition prior to surgery. Earliest surgery may be is week of 3/3.   Note, patient is a hard stick so labs are being drawn from PICC line. Asked for standing weights as bed weights are inaccurate.  Glucose / Insulin: A1c 03/2023 6.5%; BG 147-201, off SSI Electrolytes: Na 134,  others wnl Renal:  Scr 1.23 (BL ~1.5), BUN 35 Hepatic: Alk phos/ALT/Tbili wnl, AST slight low, prealbumin 15, albumin 2.3 Intake / Output; MIVF: UOP 0.9 ml/kg/hr, drain 75ml. LBM 2/24  GI Imaging:   2/25 CT: previously identified intraabdominal abscess decreased with drain, air-fluid collection in tissue concerning for abd wall abscess  GI Surgeries / Procedures: none since TPN initiation  Central access: 05/25/23 PICC TPN start date: 05/25/23   Nutritional Goals: Goal Clinimix 8/10 at 68ml/hr = daily provides 160g AA and 1325 kcal/d  RD Assessment: discussed with RD 2/28, given significant PO intake, will meet 80- 100% protein needs and 50-60% of Kcal needs via Clinimix  Estimated Needs Total Energy Estimated Needs: 2400-2600 kcal Total Protein Estimated Needs: 160-180 gm Total Fluid Estimated Needs: >2L/day Current Nutrition:  TPN  2/27 FLD- ate 100% 2/28 heart diet - eating 100% of meals   Plan:  Clinimix 8/10 without lytes daily, provides 160g AA and 1325 kcal/d, meeting 100% of AA and 55% of kcal needs  Will hold off on SMOF lipid given significant PO intake and no risk for EFAD  No standard MVI and trace elements to TPN, give as PO  Resume moderate SSI TID with meals  Monitor TPN labs on Mon/Thurs, and PRN F/u surgery plans   Thank you for allowing pharmacy to be a part of this patient's care.  Alphia Moh, PharmD, BCPS, BCCP Clinical Pharmacist  Please check AMION for all [facility] Pharmacy phone numbers After 10:00 PM, call Main Pharmacy [phone]

## 2023-05-28 NOTE — Progress Notes (Signed)
   Subjective/Chief Complaint: No new complaints WBC higher   Objective: Vital signs in last 24 hours: Temp:  [97.7 F (36.5 C)-97.8 F (36.6 C)] 97.8 F (36.6 C) (03/02 0836) Pulse Rate:  [87-94] 88 (03/02 0836) Resp:  [17-18] 17 (03/02 0836) BP: (113-143)/(57-83) 126/82 (03/02 0836) SpO2:  [95 %-98 %] 95 % (03/02 0836) Weight:  [150 kg-151.6 kg] 150 kg (03/02 0700) Last BM Date : 05/28/23  Intake/Output from previous day: 03/01 0701 - 03/02 0700 In: 3808.1 [P.O.:954; I.V.:2754.1; IV Piggyback:100] Out: 3175 [Urine:3100; Drains:75] Intake/Output this shift: Total I/O In: -  Out: 560 [Urine:560]  Gen:  Alert, NAD, obese, chronically ill appearing Abd:  soft, non-distended, wound manager in place with feculent output  Lab Results:  Recent Labs    05/27/23 0423 05/28/23 0318  WBC 7.4 12.1*  HGB 9.9* 10.2*  HCT 30.6* 32.0*  PLT 380 411*   BMET Recent Labs    05/27/23 0423 05/28/23 0318  NA 134* 134*  K 4.2 4.3  CL 98 98  CO2 27 25  GLUCOSE 145* 178*  BUN 31* 35*  CREATININE 1.27* 1.23  CALCIUM 9.0 9.1    Anti-infectives: Anti-infectives (From admission, onward)    Start     Dose/Rate Route Frequency Ordered Stop   05/27/23 1000  metroNIDAZOLE (FLAGYL) tablet 500 mg        500 mg Oral Every 12 hours 05/27/23 0722     05/23/23 2315  cefTRIAXone (ROCEPHIN) 2 g in sodium chloride 0.9 % 100 mL IVPB        2 g 200 mL/hr over 30 Minutes Intravenous Every 24 hours 05/23/23 2305     05/23/23 2315  metroNIDAZOLE (FLAGYL) IVPB 500 mg  Status:  Discontinued        500 mg 100 mL/hr over 60 Minutes Intravenous Every 12 hours 05/23/23 2305 05/27/23 1308       Assessment/Plan: 63 yo male with 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 This has been going on for almost a year now. Initially seen by General Surgery at [facility] 06/19/22.  He has lost over 100 pounds in the last year. He still has significant comorbidities of a fib, DM II, CHF,  h/o stroke, OSA, h/o PE 2019.  BMI 45   - Cards saw last admission "Small area of distal anteroapical reversible ischemia - possibly artifact, but with some apical hypokinesis and LVEF 49%, may be ischemia - if so, could be distal LAD".  Recommended for medical therapy with No cath this time.  Can hold Eliquis 2 days prior to surgery.  - cards reviewed chart and no interventions or studies recommended prior to surgery  - Abx per ID - Drain per IR - placed 1/31 and now upsized 2/25  - Pt failing outpatient management at this point - prealbumin was 15 yesterday last week. Continue TPN and ensure through the weekend and recheck on Monday  - WOC following, appreciate recs for pouching around drain. Unable to mark for stoma right now but recommendation would be to place higher if able for better visualization for patient  - plan to try to optimize nutrition and will re-evaluate surgical readiness early next week    FEN - HH Diet and ensure, TPN VTE - heparin gtt ID - Rocephin/Flagyl   LOS: 4 days   LOS: 5 days    Wynona Luna 05/28/2023

## 2023-05-28 NOTE — Progress Notes (Signed)
 05/27/2023: PROGRESS NOTE  Bradley Torres OZD:[id number] DOB: [birth year] DOA: 05/23/2023 PCP: Mechele Claude, MD   LOS: 5 days   Brief narrative:   Bradley Torres is a 63 y.o. male with medical history significant for hypertension, type 2 diabetes mellitus, OSA with CPAP intolerance, history of DVT and PE on Xarelto, PAF, chronic HFpEF, and recent admission with LLQ intra-abdominal abscess, colocutaneous fistula, and abdominal wall abscess presented to hospital with generalized weakness and malaise.  Of note patient had been admitted to the hospital 04/10/2023 and IR had a drain placed in the left lower quadrant abscess.  He was also seen by ID and was discharged on Bactrim and Augmentin.  For 3 to 4 days prior to this presentation patient started developing malaise weakness lack of appetite and lightheadedness.  In the ED patient was afebrile.  Labs showed creatinine elevation at 2.9 with albumin low at 2.6.  No leukocytosis.  Lactate was elevated at 3.1.  CT demonstrates percutaneous pigtail catheter in the left anterior abdominal wall with interval resolution/decrease in abscess at the catheter tip, and persistent air-fluid collection along the catheter tract concerning for abdominal wall abscess measuring up to 6.1 cm.  Blood cultures were drawn and patient received 1 L of IV fluids.  General surgery was consulted and patient was admitted hospital for further evaluation and treatment.  05/25/2023: Patient seen.  No new complaints.  Discussed with the surgery team.  Surgery team plans to optimize patient prior to surgery.  TPN is being considered (we defer to the surgical team).  05/28/2023: Patient seen.  No new changes.  Patient is on TPN.  Surgery teams input is highly appreciated.  Surgery team to determine timing of surgery.     Assessment/Plan: Principal Problem:   Acute renal failure superimposed on stage 3 chronic kidney disease, unspecified acute renal failure type, unspecified whether stage  3a or 3b CKD (HCC) Active Problems:   Chronic heart failure with preserved ejection fraction (HFpEF) (HCC)   Severe obstructive sleep apnea-hypopnea syndrome   Sleep related hypoxia   Type 2 diabetes mellitus with hyperglycemia, without long-term current use of insulin (HCC)   Paroxysmal atrial fibrillation (HCC)   History of pulmonary embolus (PE)   Abdominal wall abscess   Malnutrition of moderate degree    AKI superimposed on CKD 3a Creatinine on presentation was 2.9 from baseline around 1.4-1.5.  CT scan did not show any hydronephrosis.  Continue to hold nephrotoxic medication and, hold metformin and [place].  Continue IV fluid hydration.  Creatinine has slightly trended down to 2.2 today. 05/26/2023: AKI is likely multifactorial.  Repeat renal function in the morning. 05/27/2023: Slowly resolving.  Serum creatinine of 1.7 today.   Abdominal wall abscess; intraabdominal abscess; colocutaneous fistula   Patient underwent IR guided drain exchange on 05/22/2024. continue Rocephin and Flagyl.  General surgery to follow the patient.  Dr. Derrell Lolling was notified.  Patient with significant amount of drainage and soakage 05/26/2023: See above documentation.  Surgical team plans to optimize patient prior to surgery.  Paroxysmal atrial fibrillation. Patient is on Xarelto and amiodarone.   Chronic HFpEF  - Appears compensated at this time.  Continue to hold Farxiga    Hx of DVT/PE  Patient on Xarelto.   Type II DM  Hemoglobin A1c was 6.5% in January 2025.  Continue blood glucose levels, sliding scale insulin.  DVT prophylaxis:    Disposition: Uncertain at this time.  Likely home with home health and few days.  Status  is: Inpatient Remains inpatient appropriate because: IV antibiotic, continued drainage, pending clinical improvement, surgical evaluation    Code Status:     Code Status: Full Code  Family Communication: None at bedside  Consultants: General surgery Interventional  radiology  Procedures: Exchange of drain catheter.  Anti-infectives:  Rocephin and Flagyl.  Anti-infectives (From admission, onward)    Start     Dose/Rate Route Frequency Ordered Stop   05/27/23 1000  metroNIDAZOLE (FLAGYL) tablet 500 mg        500 mg Oral Every 12 hours 05/27/23 0722     05/23/23 2315  cefTRIAXone (ROCEPHIN) 2 g in sodium chloride 0.9 % 100 mL IVPB        2 g 200 mL/hr over 30 Minutes Intravenous Every 24 hours 05/23/23 2305     05/23/23 2315  metroNIDAZOLE (FLAGYL) IVPB 500 mg  Status:  Discontinued        500 mg 100 mL/hr over 60 Minutes Intravenous Every 12 hours 05/23/23 2305 05/27/23 0722        Subjective: -Patient seen. -No new complaints.  Objective: Vitals:   05/28/23 0526 05/28/23 0836  BP: (!) 143/67 126/82  Pulse: 87 88  Resp: 18 17  Temp: 97.7 F (36.5 C) 97.8 F (36.6 C)  SpO2: 97% 95%    Intake/Output Summary (Last 24 hours) at 05/28/2023 1447 Last data filed at 05/28/2023 1428 Gross per 24 hour  Intake 3214.13 ml  Output 2935 ml  Net 279.13 ml   Filed Weights   05/27/23 0500 05/28/23 0500 05/28/23 0700  Weight: (!) 151.6 kg (!) 151.6 kg (!) 150 kg   Body mass index is 44.84 kg/m.   Physical Exam:  GENERAL: Patient is morbidly obese.  Patient is not in any distress. HEENT: Patient is pale.  No jaundice. Neck: Supple.  JVD difficult to assess. CVS: S1-S2. Abdomen: Obese, soft and nontender.  Patient has a drain in the colostomy bag. Neuro: Awake and alert.  Moves all extremities. Extremities: Minimal ankle edema.   Data Review: I have personally reviewed the following laboratory data and studies,  CBC: Recent Labs  Lab 05/23/23 1639 05/24/23 2326 05/26/23 0306 05/27/23 0423 05/28/23 0318  WBC 9.9 5.9 7.6 7.4 12.1*  HGB 11.6* 10.5* 10.0* 9.9* 10.2*  HCT 35.6* 32.7* 31.2* 30.6* 32.0*  MCV 95.2 94.8 96.3 96.2 97.0  PLT 425* 393 387 380 411*   Basic Metabolic Panel: Recent Labs  Lab 05/24/23 0644  05/24/23 2326 05/26/23 0306 05/27/23 0423 05/28/23 0318  NA 134* 136 134* 134* 134*  K 3.6 4.4 3.8 4.2 4.3  CL 101 98 96* 98 98  CO2 22 27 26 27 25   GLUCOSE 123* 148* 175* 145* 178*  BUN 51* 39* 29* 31* 35*  CREATININE 2.25* 2.04* 1.47* 1.27* 1.23  CALCIUM 8.7* 9.0 8.9 9.0 9.1  MG  --  2.0 1.8 2.0  --   PHOS  --   --  3.7 3.0  --    Liver Function Tests: Recent Labs  Lab 05/23/23 1639 05/26/23 0306  AST 24 14*  ALT 13 11  ALKPHOS 64 50  BILITOT 0.9 0.3  PROT 7.4 6.1*  ALBUMIN 2.6* 2.3*   Recent Labs  Lab 05/23/23 1639  LIPASE 25   No results for input(s): "AMMONIA" in the last 168 hours. Cardiac Enzymes: No results for input(s): "CKTOTAL", "CKMB", "CKMBINDEX", "TROPONINI" in the last 168 hours. BNP (last 3 results) Recent Labs    04/28/23 0102 04/29/23 2204  BNP  30.3 71.9    ProBNP (last 3 results) No results for input(s): "PROBNP" in the last 8760 hours.  CBG: Recent Labs  Lab 05/27/23 0832 05/27/23 1237 05/27/23 1714 05/28/23 0832 05/28/23 1228  GLUCAP 160* 201* 147* 154* 199*   Recent Results (from the past 240 hours)  Blood culture (routine x 2)     Status: None   Collection Time: 05/23/23  4:39 PM   Specimen: BLOOD LEFT ARM  Result Value Ref Range Status   Specimen Description BLOOD LEFT ARM  Final   Special Requests   Final    BOTTLES DRAWN AEROBIC AND ANAEROBIC Blood Culture adequate volume   Culture   Final    NO GROWTH 5 DAYS Performed at [facility], [address], [place], [place] [address]    Report Status 05/28/2023 FINAL  Final  Resp panel by RT-PCR (RSV, Flu A&B, Covid)     Status: None   Collection Time: 05/23/23  4:39 PM   Specimen: Nasal Swab  Result Value Ref Range Status   SARS Coronavirus 2 by RT PCR NEGATIVE NEGATIVE Final   Influenza A by PCR NEGATIVE NEGATIVE Final   Influenza B by PCR NEGATIVE NEGATIVE Final    Comment: (NOTE) The Xpert Xpress SARS-CoV-2/FLU/RSV plus assay is intended as an aid in the  diagnosis of influenza from Nasopharyngeal swab specimens and should not be used as a sole basis for treatment. Nasal washings and aspirates are unacceptable for Xpert Xpress SARS-CoV-2/FLU/RSV testing.  Fact Sheet for Patients: BloggerCourse.com  Fact Sheet for Healthcare Providers: SeriousBroker.it  This test is not yet approved or cleared by the [place] FDA and has been authorized for detection and/or diagnosis of SARS-CoV-2 by FDA under an Emergency Use Authorization (EUA). This EUA will remain in effect (meaning this test can be used) for the duration of the COVID-19 declaration under Section 564(b)(1) of the Act, 21 U.S.C. section 360bbb-3(b)(1), unless the authorization is terminated or revoked.     Resp Syncytial Virus by PCR NEGATIVE NEGATIVE Final    Comment: (NOTE) Fact Sheet for Patients: BloggerCourse.com  Fact Sheet for Healthcare Providers: SeriousBroker.it  This test is not yet approved or cleared by the [place] FDA and has been authorized for detection and/or diagnosis of SARS-CoV-2 by FDA under an Emergency Use Authorization (EUA). This EUA will remain in effect (meaning this test can be used) for the duration of the COVID-19 declaration under Section 564(b)(1) of the Act, 21 U.S.C. section 360bbb-3(b)(1), unless the authorization is terminated or revoked.  Performed at [facility], [address], [place], [place] [address]   Blood culture (routine x 2)     Status: None   Collection Time: 05/23/23  4:43 PM   Specimen: BLOOD RIGHT ARM  Result Value Ref Range Status   Specimen Description BLOOD RIGHT ARM  Final   Special Requests   Final    BOTTLES DRAWN AEROBIC AND ANAEROBIC Blood Culture adequate volume   Culture   Final    NO GROWTH 5 DAYS Performed at [facility], [address] [place], [place] [address]    Report Status  05/28/2023 FINAL  Final     Studies: No results found.   Time spent: 35 minutes.   Barnetta Chapel, MD  [facility] 05/28/2023  If 7PM-7AM, please contact night-coverage

## 2023-05-29 ENCOUNTER — Ambulatory Visit: Payer: Medicaid Other | Admitting: Family Medicine

## 2023-05-29 DIAGNOSIS — K632 Fistula of intestine: Secondary | ICD-10-CM | POA: Diagnosis not present

## 2023-05-29 DIAGNOSIS — N179 Acute kidney failure, unspecified: Secondary | ICD-10-CM | POA: Diagnosis not present

## 2023-05-29 DIAGNOSIS — N183 Chronic kidney disease, stage 3 unspecified: Secondary | ICD-10-CM | POA: Diagnosis not present

## 2023-05-29 LAB — PHOSPHORUS: Phosphorus: 2.7 mg/dL (ref 2.5–4.6)

## 2023-05-29 LAB — COMPREHENSIVE METABOLIC PANEL
ALT: 15 U/L (ref 0–44)
AST: 18 U/L (ref 15–41)
Albumin: 2.4 g/dL — ABNORMAL LOW (ref 3.5–5.0)
Alkaline Phosphatase: 43 U/L (ref 38–126)
Anion gap: 8 (ref 5–15)
BUN: 34 mg/dL — ABNORMAL HIGH (ref 8–23)
CO2: 26 mmol/L (ref 22–32)
Calcium: 9.3 mg/dL (ref 8.9–10.3)
Chloride: 99 mmol/L (ref 98–111)
Creatinine, Ser: 1.12 mg/dL (ref 0.61–1.24)
GFR, Estimated: >60 mL/min (ref >60)
Glucose, Bld: 161 mg/dL — ABNORMAL HIGH (ref 70–99)
Potassium: 4.1 mmol/L (ref 3.5–5.1)
Sodium: 133 mmol/L — ABNORMAL LOW (ref 135–145)
Total Bilirubin: 0.2 mg/dL (ref 0.0–1.2)
Total Protein: 6.2 g/dL — ABNORMAL LOW (ref 6.5–8.1)

## 2023-05-29 LAB — GLUCOSE, CAPILLARY
Glucose-Capillary: 190 mg/dL — ABNORMAL HIGH (ref 70–99)
Glucose-Capillary: 239 mg/dL — ABNORMAL HIGH (ref 70–99)
Glucose-Capillary: 231 mg/dL — ABNORMAL HIGH (ref 70–99)

## 2023-05-29 LAB — MAGNESIUM: Magnesium: 1.8 mg/dL (ref 1.7–2.4)

## 2023-05-29 LAB — HEPARIN LEVEL (UNFRACTIONATED): Heparin Unfractionated: 0.51 [IU]/mL (ref 0.30–0.70)

## 2023-05-29 LAB — TRIGLYCERIDES: Triglycerides: 58 mg/dL (ref <150)

## 2023-05-29 LAB — PREALBUMIN: Prealbumin: 22 mg/dL (ref 18–38)

## 2023-05-29 MED ORDER — HEPARIN (PORCINE) 25000 UT/250ML-% IV SOLN
2750.0000 [IU]/h | INTRAVENOUS | Status: AC
  Administered 2023-05-29 (×2): 2750 [IU]/h via INTRAVENOUS
  Filled 2023-05-29: qty 250

## 2023-05-29 MED ORDER — GLUCERNA SHAKE PO LIQD
237.0000 mL | Freq: Three times a day (TID) | ORAL | Status: DC
  Administered 2023-05-29 – 2023-05-30 (×2): 237 mL via ORAL

## 2023-05-29 MED ORDER — CLINIMIX/DEXTROSE (8/10) 8 % IV SOLN
INTRAVENOUS | Status: AC
  Filled 2023-05-29 (×2): qty 2000

## 2023-05-29 NOTE — Progress Notes (Incomplete)
 Nutrition Follow-up  DOCUMENTATION CODES:   Morbid obesity, Non-severe (moderate) malnutrition in context of chronic illness  INTERVENTION:  TPN per pharmacy To meet 50-60% of estimated calorie needs (1200-1440 kcal) and 80-100% of estimated protein needs (128-160 gm) Encourage protein and PO intake  Double proteins with meals Ensure Enlive po TID, each supplement provides 350 kcal and 20 grams of protein. Daily weights   NUTRITION DIAGNOSIS:   Moderate Malnutrition related to chronic illness as evidenced by mild fat depletion, mild muscle depletion, energy intake < or equal to 75% for > or equal to 1 month.  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 Recently admitted 1/30-2/7 with abdominal pain found to have LLQ intra-abdominal abscess, feculent drainage, colocutaneous fistula. Discharged with drain and antibitoics with plan to optimize nutrition before surgery for management of fistula. Now presents with generalized weakness, malaise, loss of appetite. PMH of HTN, Morbid obesity, DVT, diverticulitis, CAD, T2DM, prior stroke (2019), PAF, chronic HFpEF, CKD 3.  TPN continuing. Only eating 2 meals but eating 100%. Double proteins with meals. 2 Ensures per day. 100% of breakfast. Surgery undecided. Standing weight yesterady was 330 lbs.   Admit weight: *** Current weight: ***   05/29/23 150 kg   05/24/23 134.4 kg - Standing weight?   05/11/23 (!) 156.5 kg  05/03/23 (!) 153.4 kg  02/21/23 (!) 157.7 kg  02/07/23 (!) 152.9 kg  12/23/22 (!) 152.3 kg  11/01/22 (!) 149.7 kg  10/13/22 (!) 149.7 kg  10/01/22 (!) 151 kg  09/08/22 (!) 156.6 kg  04/28/2022 170.4 kg - Care Everywhere     Intake/Output Summary (Last 24 hours) at 05/29/2023 0818 Last data filed at 05/29/2023 2440 Gross per 24 hour  Intake 3063.27 ml  Output 2500 ml  Net 563.27 ml   Drains/Lines: 200 ml wound vac x 24 hours   Average Meal Intake: ***-***: ***% intake x *** recorded meals  Nutritionally Relevant Medications: Scheduled Meds:  allopurinol  100 mg Oral Daily   amiodarone  200 mg Oral Daily   atorvastatin  20 mg Oral Daily   Chlorhexidine Gluconate Cloth  6 each Topical Daily   feeding supplement  237 mL Oral TID BM   ferrous sulfate  325 mg Oral BID WC   insulin aspart  0-9 Units Subcutaneous TID WC   metroNIDAZOLE  500 mg Oral Q12H   multivitamin with minerals  1 tablet Oral Daily   polycarbophil  625 mg Oral BID   polyethylene glycol  17 g Oral Daily   senna  1 tablet Oral QHS   sodium chloride flush  3 mL Intravenous Q12H   Continuous Infusions:  cefTRIAXone (ROCEPHIN)  IV 2 g (05/28/23 2316)   heparin 2,750 Units/hr (05/28/23 2342)   liver oil-zinc oxide     TPN (CLINIMIX) Adult without lytes 82 mL/hr at 05/28/23 1823   Labs Reviewed: Sodium 133, BUN 34,  CBG ranges from 154-199 mg/dL over the last 24 hours HgbA1c 6.5  Diet Order:   Diet Order             Diet Heart Room service appropriate? Yes; Fluid consistency: Thin  Diet effective now  EDUCATION NEEDS:   Education needs have been addressed  Skin:  Skin Assessment: Skin Integrity Issues: Skin Integrity Issues:: Wound VAC Wound Vac: Abdomen  Last BM:  05/25/2022  Height:   Ht Readings from Last 1 Encounters:  05/28/23 6' (1.829 m)    Weight:   Wt Readings from Last 1 Encounters:  05/29/23 (!) 150 kg    Ideal Body Weight:  80.9 kg  BMI:  Body mass index is 44.83 kg/m.  Estimated Nutritional Needs:   Kcal:  2400-2600 kcal  Protein:  160-180 gm  Fluid:  >2L/day   Elliot Dally, RD Registered Dietitian  See Amion for more information

## 2023-05-29 NOTE — Progress Notes (Signed)
 PHARMACY - ANTICOAGULATION CONSULT NOTE  Pharmacy Consult for Heparin Indication: atrial fibrillation  Patient Measurements: Height: 6' (182.9 cm) Weight: (!) 150 kg (330 lb 9.3 oz) IBW/kg (Calculated) : 77.6 Heparin Dosing Weight: 115 kg  Vital Signs: Temp: 98 F (36.7 C) (03/03 0816) Temp Source: Oral (03/03 0816) BP: 125/67 (03/03 0816) Pulse Rate: 94 (03/03 0816)  Labs: Recent Labs    05/26/23 1256 05/26/23 2155 05/27/23 0423 05/27/23 0423 05/27/23 1430 05/28/23 0318 05/29/23 0338  HGB  --   --  9.9*  --   --  10.2*  --   HCT  --   --  30.6*  --   --  32.0*  --   PLT  --   --  380  --   --  411*  --   APTT 28 68* 106*  --   --   --   --   HEPARINUNFRC  --   --  0.72*   < > 0.65 0.42 0.51  CREATININE  --   --  1.27*  --   --  1.23 1.12   < > = values in this interval not displayed.    Estimated Creatinine Clearance: 104.4 mL/min (by C-G formula based on SCr of 1.12 mg/dL).  Assessment: 63 year old male with PMH significant for atrial fibrillation and hx of VTE on Xarelto PTA who is admitted for evaluation of abdominal wall abscess. Pharmacy consulted to manage IV heparin while Xarelto is on hold.Last dose Xarelto reported 2/25.   Note heparin level is being drawn from PICC line where heparin is running with instructions to hold heparin 5 min prior to draw.  Heparin level therapeutic on 2750 units/hr. CBC stable.  Goal of Therapy:  Heparin level 0.3-0.7 units/ml aPTT 66-102 seconds Monitor platelets by anticoagulation protocol: Yes   Plan:  Continue heparin infusion at 2750 units/hr Check heparin level daily while on heparin Continue to monitor H&H and platelets F/u surgery plans    Thank you for allowing pharmacy to be a part of this patient's care.  Thelma Barge, PharmD, BCPS Clinical Pharmacist

## 2023-05-29 NOTE — Consult Note (Signed)
 WOC Nurse requested for preoperative stoma site marking  Discussed surgical procedure and stoma creation with patient and family.  Explained role of the WOC nurse team.  Provided the patient with educational booklet and provided samples of pouching options.  Answered patient questions.  Patient is flat is asks why we need to do this now when he doesn't even know when surgery is.  I explain that by marking now, he is ready for surgery and we have identified the best possible spot for him.  His large Kathrine Cords is in place and I peel back this pouch and mark and place sticker over the area. Please refer to measurements in the note below.    Examined patient lying, sitting, patient refused standing in order to place the marking in the patient's visual field, away from any creases or abdominal contour issues and within the rectus muscle.  Patient has large pendulous abdomen.  I high marking is the patient's best option to perform ostomy care independently.  Patient is not very participative in the information provided.   Marked for colostomy in the LUQ  8 cm to the left of the umbilicus and 6 cm above the umbilicus.  Marking is located beneath the Eakin pouch in place.    Patient's abdomen cleansed with CHG wipes at site markings, allowed to air dry prior to marking.Covered mark with thin film transparent dressing to preserve mark until date of surgery.   WOC Nurse team will follow up with patient after surgery for continue ostomy care and teaching.    Please re-consult if needed.  Mike Gip MSN, RN, [name]-BC CWON Wound, Ostomy, Continence Nurse [facility] [phone] Pager [phone]

## 2023-05-29 NOTE — Progress Notes (Signed)
 PHARMACY - TOTAL PARENTERAL NUTRITION CONSULT NOTE   Indication:  chronic malnutrition chronic colocutaneous fistula/ subcutaneous abscess   Patient Measurements: Height: 6' (182.9 cm) Weight: (!) 150 kg (330 lb 9.3 oz) IBW/kg (Calculated) : 77.6 TPN AdjBW (KG): 95.7 Body mass index is 44.83 kg/m. Usual Weight: 152-155 kg (fluctuating over the past 6 months; 170 kg 04/2022) 2/26 standing weight 134 kg (confirmed with RN and patient) 3/01 standing wt =150.2kg per RN   Assessment:  63 yo male with with medical history significant for hypertension, type 2 diabetes mellitus, history of DVT/PE on Xarelto, PAF, HFpEF with a recent admission for LLQ intra-abdominal abscess, colocutaneous fistula, and abdominal wall abscess on 04/27/2023, had IR drain placed into the LLQ abscess and the intra-abdominal abscess aspirated on 1/31. He returned to the ED with concerns that the drain in his abdomen was not functioning and now is in need of surgery with a possible colostomy. Per chart review, this has been going on for 8 months resulting in about 30 lbs weight loss. Of note, bed weights are significantly higher than standing weights. Per surgery, allow patient to eat freely by mouth. Patient has been eating 100% of eggs, sausage, cereal, toast, orange juice and coffee for breakfast and spaghetti, [place] and gravy, green beans, creamed potatoes for lunch/dinner. As of 2/28, patient is constipated, last BM PTA. Pharmacy consulted to start TPN to optimize nutrition prior to surgery. Earliest surgery may be is week of 3/3. Surgery team spoke with patient, now optimized to proceed with surgery but patient remains undecided.   Note, patient is a hard stick so labs are being drawn from PICC line. Asked for standing weights as bed weights are inaccurate.  Glucose / Insulin: A1c 03/2023 6.5%; BG 154-199, 5 units SSI used/24 hrs Electrolytes: Na 133, K 4.1, Mg 1.8, Phos 2.7, others wnl Renal:  Scr 1.12 (BL ~1.5), BUN  34 Hepatic: Alk phos/ALT/Tbili wnl, prealbumin up 15 > 22, albumin 2.4, TG 58 Intake / Output; MIVF: UOP 0.9 ml/kg/hr, drain 75ml. LBM 3/02   GI Imaging:  2/25 CT: previously identified intraabdominal abscess decreased with drain, air-fluid collection in tissue concerning for abd wall abscess  GI Surgeries / Procedures:  none since TPN initiation  Central access: 05/25/23 PICC TPN start date: 05/25/23   Nutritional Goals: Goal Clinimix 8/10 at 45ml/hr = daily provides 160g AA and 1325 kcal/d  RD Assessment: discussed with RD 2/28, given significant PO intake, will meet 80- 100% protein needs and 50-60% of Kcal needs via Clinimix  Estimated Needs Total Energy Estimated Needs: 2400-2600 kcal Total Protein Estimated Needs: 160-180 gm Total Fluid Estimated Needs: >2L/day  Current Nutrition:  TPN  2/27 FLD- ate 100% 2/28 heart diet - eating 100% of meals   Plan:  Clinimix 8/10 without lytes daily, provides 160g AA and 1325 kcal/d, meeting 100% of AA and 55% of kcal needs  Will hold off on SMOF lipid given significant PO intake and no risk for EFAD  No standard MVI and trace elements to TPN, give as PO  Continue moderate SSI TID with meals  Monitor TPN labs on Mon/Thurs, and PRN F/u surgery plans   Thank you for allowing pharmacy to be a part of this patient's care.  Thelma Barge, PharmD, BCPS Clinical Pharmacist

## 2023-05-29 NOTE — Progress Notes (Addendum)
 Subjective: CC: Stable abdominal pain. Tolerating diet without n/v. BM yesterday.   Afebrile. No tachycardia or hypotension. WBC 12.1 on last check.   Objective: Vital signs in last 24 hours: Temp:  [97.6 F (36.4 C)-98.5 F (36.9 C)] 98 F (36.7 C) (03/03 0816) Pulse Rate:  [84-98] 94 (03/03 0816) Resp:  [16-18] 17 (03/03 0816) BP: (121-152)/(60-71) 125/67 (03/03 0816) SpO2:  [96 %-98 %] 96 % (03/03 0816) Weight:  [150 kg] 150 kg (03/03 0500) Last BM Date : 05/28/23  Intake/Output from previous day: 03/02 0701 - 03/03 0700 In: 3063.3 [P.O.:480; I.V.:2583.3] Out: 3060 [Urine:3010; Drains:50] Intake/Output this shift: Total I/O In: -  Out: 600 [Urine:600]  PE: Gen:  Alert, NAD, pleasant Abd: Soft, ND, generalized ttp, eakins bag with IR drain and feculent output.   Lab Results:  Recent Labs    05/27/23 0423 05/28/23 0318  WBC 7.4 12.1*  HGB 9.9* 10.2*  HCT 30.6* 32.0*  PLT 380 411*   BMET Recent Labs    05/28/23 0318 05/29/23 0338  NA 134* 133*  K 4.3 4.1  CL 98 99  CO2 25 26  GLUCOSE 178* 161*  BUN 35* 34*  CREATININE 1.23 1.12  CALCIUM 9.1 9.3   PT/INR No results for input(s): "LABPROT", "INR" in the last 72 hours. CMP     Component Value Date/Time   NA 133 (L) 05/29/2023 0338   NA 142 02/21/2023 1544   K 4.1 05/29/2023 0338   CL 99 05/29/2023 0338   CO2 26 05/29/2023 0338   GLUCOSE 161 (H) 05/29/2023 0338   BUN 34 (H) 05/29/2023 0338   BUN 18 02/21/2023 1544   CREATININE 1.12 05/29/2023 0338   CREATININE 1.50 (H) 05/11/2023 0300   CALCIUM 9.3 05/29/2023 0338   PROT 6.2 (L) 05/29/2023 0338   PROT 7.1 02/21/2023 1544   ALBUMIN 2.4 (L) 05/29/2023 0338   ALBUMIN 3.8 (L) 02/21/2023 1544   AST 18 05/29/2023 0338   ALT 15 05/29/2023 0338   ALKPHOS 43 05/29/2023 0338   BILITOT 0.2 05/29/2023 0338   BILITOT 0.3 02/21/2023 1544   GFRNONAA >60 05/29/2023 0338   GFRAA 68 12/16/2019 0000   Lipase     Component Value Date/Time    LIPASE 25 05/23/2023 1639    Studies/Results: No results found.  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 This has been going on for almost a year now. Initially seen by General Surgery at [facility] 06/19/22.  He has lost over 100 pounds in the last year. He still has significant comorbidities of a fib, DM II, CHF, h/o stroke, OSA, h/o PE 2019.  BMI 45   - Cards saw last admission "Small area of distal  anteroapical reversible ischemia - possibly artifact, but with some apical hypokinesis and LVEF 49%, may be ischemia - if so, could be distal LAD".  Recommended for medical therapy with No cath this time.  Can hold Eliquis 2 days prior to surgery.  - Cards reviewed chart and no interventions or studies recommended prior to surgery. RCRI is 15% for major cardiovascular events  - Abx per ID - Drain per IR - placed 1/31 and now upsized 2/25  - Continue TPN and ensure  - Pre-alb improved to 22. Alb 2.4 - WOC following, appreciate recs for pouching around drain.  - - Pt failing outpatient management at this point. Recommend OR during this admission. This was discussed with the patient by my attending. Patient has not yet decided if he would like to proceed with surgery. NPO midnight and will have WOCN mark ostomy site incase patient agreeable to proceed with surgery in AM    FEN - HH Diet and  ensure. NPO midnight. TPN VTE - Heparin gtt. Will discuss timing of holding with MD  ID - Rocephin/Flagyl   I reviewed nursing notes, last 24 h vitals and pain scores, last 48 h intake and output, last 24 h labs and trends, and last 24 h imaging results.   LOS: 6 days    Jacinto Halim, [facility] 05/29/2023, 10:35 AM Please see Amion for pager number during day hours 7:00am-4:30pm

## 2023-05-29 NOTE — Progress Notes (Signed)
 05/27/2023: PROGRESS NOTE  Bradley Torres ZOX:[id number] DOB: 10-28-1960 DOA: 05/23/2023 PCP: Mechele Claude, MD   LOS: 6 days   Brief narrative:   Bradley Torres is a 63 y.o. male with medical history significant for hypertension, type 2 diabetes mellitus, OSA with CPAP intolerance, history of DVT and PE on Xarelto, PAF, chronic HFpEF, and recent admission with LLQ intra-abdominal abscess, colocutaneous fistula, and abdominal wall abscess presented to hospital with generalized weakness and malaise.  Of note patient had been admitted to the hospital 04/10/2023 and IR had a drain placed in the left lower quadrant abscess.  He was also seen by ID and was discharged on Bactrim and Augmentin.  For 3 to 4 days prior to this presentation patient started developing malaise weakness lack of appetite and lightheadedness.  In the ED patient was afebrile.  Labs showed creatinine elevation at 2.9 with albumin low at 2.6.  No leukocytosis.  Lactate was elevated at 3.1.  CT demonstrates percutaneous pigtail catheter in the left anterior abdominal wall with interval resolution/decrease in abscess at the catheter tip, and persistent air-fluid collection along the catheter tract concerning for abdominal wall abscess measuring up to 6.1 cm.  Blood cultures were drawn and patient received 1 L of IV fluids.  General surgery was consulted and patient was admitted hospital for further evaluation and treatment.  05/25/2023: Patient seen.  No new complaints.  Discussed with the surgery team.  Surgery team plans to optimize patient prior to surgery.  TPN is being considered (we defer to the surgical team).  05/29/2023: Patient seen.  No new changes.  Discussed with patient nurse and the surgery team.  Surgery team has discussed proceeding with surgery with the patient.       Assessment/Plan: Principal Problem:   Acute renal failure superimposed on stage 3 chronic kidney disease, unspecified acute renal failure type, unspecified  whether stage 3a or 3b CKD (HCC) Active Problems:   Chronic heart failure with preserved ejection fraction (HFpEF) (HCC)   Severe obstructive sleep apnea-hypopnea syndrome   Sleep related hypoxia   Type 2 diabetes mellitus with hyperglycemia, without long-term current use of insulin (HCC)   Paroxysmal atrial fibrillation (HCC)   History of pulmonary embolus (PE)   Abdominal wall abscess   Malnutrition of moderate degree    AKI superimposed on CKD 3a versus acute kidney injury: Creatinine on presentation was 2.9 from baseline around 1.4-1.5.  CT scan did not show any hydronephrosis.  Continue to hold nephrotoxic medication and, hold metformin and [place].  Continue IV fluid hydration.  Creatinine has slightly trended down to 2.2 today. 05/26/2023: AKI is likely multifactorial.  Repeat renal function in the morning. 05/29/2023: AKI has resolved.  Serum creatinine is 1.12 today.   Abdominal wall abscess; intraabdominal abscess; colocutaneous fistula   Patient underwent IR guided drain exchange on 05/22/2024. continue Rocephin and Flagyl.  General surgery to follow the patient.  Dr. Derrell Lolling was notified.  Patient with significant amount of drainage and soakage 05/29/2023: Patient is awaiting surgery.  Input from the surgery team is highly appreciated.   Paroxysmal atrial fibrillation. Patient is on heparin drip and amiodarone.   Chronic HFpEF  - Appears compensated at this time.  Continue to hold Farxiga    Hx of DVT/PE  Patient on Xarelto.   Type II DM  Hemoglobin A1c was 6.5% in January 2025.  Continue blood glucose levels, sliding scale insulin.  DVT prophylaxis:    Disposition: Uncertain at this time.  Likely  home with home health and few days.  Status is: Inpatient Remains inpatient appropriate because: IV antibiotic, continued drainage, pending clinical improvement, surgical evaluation    Code Status:     Code Status: Full Code  Family Communication: None at  bedside  Consultants: General surgery Interventional radiology  Procedures: Exchange of drain catheter.  Anti-infectives:  Rocephin and Flagyl.  Anti-infectives (From admission, onward)    Start     Dose/Rate Route Frequency Ordered Stop   05/27/23 1000  metroNIDAZOLE (FLAGYL) tablet 500 mg        500 mg Oral Every 12 hours 05/27/23 0722     05/23/23 2315  cefTRIAXone (ROCEPHIN) 2 g in sodium chloride 0.9 % 100 mL IVPB        2 g 200 mL/hr over 30 Minutes Intravenous Every 24 hours 05/23/23 2305     05/23/23 2315  metroNIDAZOLE (FLAGYL) IVPB 500 mg  Status:  Discontinued        500 mg 100 mL/hr over 60 Minutes Intravenous Every 12 hours 05/23/23 2305 05/27/23 0722        Subjective: -Patient seen. -No new complaints.  Objective: Vitals:   05/29/23 0816 05/29/23 1647  BP: 125/67 (!) 150/79  Pulse: 94 92  Resp: 17 17  Temp: 98 F (36.7 C) 97.8 F (36.6 C)  SpO2: 96% 97%    Intake/Output Summary (Last 24 hours) at 05/29/2023 1709 Last data filed at 05/29/2023 1705 Gross per 24 hour  Intake 5203.43 ml  Output 2850 ml  Net 2353.43 ml   Filed Weights   05/28/23 0700 05/29/23 0500 05/29/23 1100  Weight: (!) 150 kg (!) 150 kg (!) 150 kg   Body mass index is 44.85 kg/m.   Physical Exam:  GENERAL: Patient is morbidly obese.  Patient is not in any distress. HEENT: Patient is pale.  No jaundice. Neck: Supple.  JVD difficult to assess. CVS: S1-S2. Abdomen: Obese, soft and nontender.  Patient has a drain in the colostomy bag. Neuro: Awake and alert.  Moves all extremities. Extremities: Minimal ankle edema.   Data Review: I have personally reviewed the following laboratory data and studies,  CBC: Recent Labs  Lab 05/23/23 1639 05/24/23 2326 05/26/23 0306 05/27/23 0423 05/28/23 0318  WBC 9.9 5.9 7.6 7.4 12.1*  HGB 11.6* 10.5* 10.0* 9.9* 10.2*  HCT 35.6* 32.7* 31.2* 30.6* 32.0*  MCV 95.2 94.8 96.3 96.2 97.0  PLT 425* 393 387 380 411*   Basic Metabolic  Panel: Recent Labs  Lab 05/24/23 2326 05/26/23 0306 05/27/23 0423 05/28/23 0318 05/29/23 0338  NA 136 134* 134* 134* 133*  K 4.4 3.8 4.2 4.3 4.1  CL 98 96* 98 98 99  CO2 27 26 27 25 26   GLUCOSE 148* 175* 145* 178* 161*  BUN 39* 29* 31* 35* 34*  CREATININE 2.04* 1.47* 1.27* 1.23 1.12  CALCIUM 9.0 8.9 9.0 9.1 9.3  MG 2.0 1.8 2.0  --  1.8  PHOS  --  3.7 3.0  --  2.7   Liver Function Tests: Recent Labs  Lab 05/23/23 1639 05/26/23 0306 05/29/23 0338  AST 24 14* 18  ALT 13 11 15   ALKPHOS 64 50 43  BILITOT 0.9 0.3 0.2  PROT 7.4 6.1* 6.2*  ALBUMIN 2.6* 2.3* 2.4*   Recent Labs  Lab 05/23/23 1639  LIPASE 25   No results for input(s): "AMMONIA" in the last 168 hours. Cardiac Enzymes: No results for input(s): "CKTOTAL", "CKMB", "CKMBINDEX", "TROPONINI" in the last 168 hours. BNP (last  3 results) Recent Labs    04/28/23 0102 04/29/23 2204  BNP 30.3 71.9    ProBNP (last 3 results) No results for input(s): "PROBNP" in the last 8760 hours.  CBG: Recent Labs  Lab 05/28/23 1228 05/28/23 1635 05/29/23 0816 05/29/23 1143 05/29/23 1703  GLUCAP 199* 154* 190* 239* 231*   Recent Results (from the past 240 hours)  Blood culture (routine x 2)     Status: None   Collection Time: 05/23/23  4:39 PM   Specimen: BLOOD LEFT ARM  Result Value Ref Range Status   Specimen Description BLOOD LEFT ARM  Final   Special Requests   Final    BOTTLES DRAWN AEROBIC AND ANAEROBIC Blood Culture adequate volume   Culture   Final    NO GROWTH 5 DAYS Performed at [facility], [address], [place], [place] [address]    Report Status 05/28/2023 FINAL  Final  Resp panel by RT-PCR (RSV, Flu A&B, Covid)     Status: None   Collection Time: 05/23/23  4:39 PM   Specimen: Nasal Swab  Result Value Ref Range Status   SARS Coronavirus 2 by RT PCR NEGATIVE NEGATIVE Final   Influenza A by PCR NEGATIVE NEGATIVE Final   Influenza B by PCR NEGATIVE NEGATIVE Final    Comment: (NOTE) The  Xpert Xpress SARS-CoV-2/FLU/RSV plus assay is intended as an aid in the diagnosis of influenza from Nasopharyngeal swab specimens and should not be used as a sole basis for treatment. Nasal washings and aspirates are unacceptable for Xpert Xpress SARS-CoV-2/FLU/RSV testing.  Fact Sheet for Patients: BloggerCourse.com  Fact Sheet for Healthcare Providers: SeriousBroker.it  This test is not yet approved or cleared by the [place] FDA and has been authorized for detection and/or diagnosis of SARS-CoV-2 by FDA under an Emergency Use Authorization (EUA). This EUA will remain in effect (meaning this test can be used) for the duration of the COVID-19 declaration under Section 564(b)(1) of the Act, 21 U.S.C. section 360bbb-3(b)(1), unless the authorization is terminated or revoked.     Resp Syncytial Virus by PCR NEGATIVE NEGATIVE Final    Comment: (NOTE) Fact Sheet for Patients: BloggerCourse.com  Fact Sheet for Healthcare Providers: SeriousBroker.it  This test is not yet approved or cleared by the [place] FDA and has been authorized for detection and/or diagnosis of SARS-CoV-2 by FDA under an Emergency Use Authorization (EUA). This EUA will remain in effect (meaning this test can be used) for the duration of the COVID-19 declaration under Section 564(b)(1) of the Act, 21 U.S.C. section 360bbb-3(b)(1), unless the authorization is terminated or revoked.  Performed at [facility], [address], [place], [place] [address]   Blood culture (routine x 2)     Status: None   Collection Time: 05/23/23  4:43 PM   Specimen: BLOOD RIGHT ARM  Result Value Ref Range Status   Specimen Description BLOOD RIGHT ARM  Final   Special Requests   Final    BOTTLES DRAWN AEROBIC AND ANAEROBIC Blood Culture adequate volume   Culture   Final    NO GROWTH 5 DAYS Performed at [facility], [address], [place], [place] [address]    Report Status 05/28/2023 FINAL  Final     Studies: No results found.   Time spent: 35 minutes.   Barnetta Chapel, MD  [facility] 05/29/2023  If 7PM-7AM, please contact night-coverage

## 2023-05-30 DIAGNOSIS — N183 Chronic kidney disease, stage 3 unspecified: Secondary | ICD-10-CM | POA: Diagnosis not present

## 2023-05-30 DIAGNOSIS — N179 Acute kidney failure, unspecified: Secondary | ICD-10-CM | POA: Diagnosis not present

## 2023-05-30 DIAGNOSIS — K632 Fistula of intestine: Secondary | ICD-10-CM | POA: Diagnosis not present

## 2023-05-30 LAB — GLUCOSE, CAPILLARY
Glucose-Capillary: 240 mg/dL — ABNORMAL HIGH (ref 70–99)
Glucose-Capillary: 218 mg/dL — ABNORMAL HIGH (ref 70–99)
Glucose-Capillary: 233 mg/dL — ABNORMAL HIGH (ref 70–99)
Glucose-Capillary: 204 mg/dL — ABNORMAL HIGH (ref 70–99)

## 2023-05-30 LAB — CBC
HCT: 30.5 % — ABNORMAL LOW (ref 39.0–52.0)
Hemoglobin: 9.9 g/dL — ABNORMAL LOW (ref 13.0–17.0)
MCH: 31.1 pg (ref 26.0–34.0)
MCHC: 32.5 g/dL (ref 30.0–36.0)
MCV: 95.9 fL (ref 80.0–100.0)
Platelets: 389 10*3/uL (ref 150–400)
RBC: 3.18 MIL/uL — ABNORMAL LOW (ref 4.22–5.81)
RDW: 15.1 % (ref 11.5–15.5)
WBC: 11.5 10*3/uL — ABNORMAL HIGH (ref 4.0–10.5)
nRBC: 0 % (ref 0.0–0.2)

## 2023-05-30 LAB — HEPARIN LEVEL (UNFRACTIONATED): Heparin Unfractionated: 0.74 [IU]/mL — ABNORMAL HIGH (ref 0.30–0.70)

## 2023-05-30 MED ORDER — ENSURE ENLIVE PO LIQD
237.0000 mL | Freq: Three times a day (TID) | ORAL | Status: DC
  Administered 2023-05-30: 237 mL via ORAL

## 2023-05-30 MED ORDER — CLINIMIX/DEXTROSE (8/10) 8 % IV SOLN
INTRAVENOUS | Status: AC
  Filled 2023-05-30: qty 2000

## 2023-05-30 MED ORDER — INSULIN GLARGINE 100 UNIT/ML ~~LOC~~ SOLN
10.0000 [IU] | SUBCUTANEOUS | Status: DC
  Administered 2023-05-30: 10 [IU] via SUBCUTANEOUS
  Filled 2023-05-30 (×2): qty 0.1

## 2023-05-30 NOTE — Progress Notes (Signed)
 Mobility Specialist Progress Note:   05/30/23 1219  Mobility  Activity Ambulated with assistance to bathroom  Level of Assistance Standby assist, set-up cues, supervision of patient - no hands on  Assistive Device  (IV Pole)  Distance Ambulated (ft) 15 ft  Activity Response Tolerated well  Mobility Referral Yes  Mobility visit 1 Mobility  Mobility Specialist Start Time (ACUTE ONLY) 1125  Mobility Specialist Stop Time (ACUTE ONLY) 1132  Mobility Specialist Time Calculation (min) (ACUTE ONLY) 7 min   Pt received in bed, requesting assistance to BR. SB assist to ambulate to BR with IV Pole. Pt left in BR with RN present in room. NT notified.  Leory Plowman  Mobility Specialist Please contact via [employer] office at [phone]

## 2023-05-30 NOTE — Progress Notes (Signed)
 05/27/2023: PROGRESS NOTE  Bradley Torres GEX:[id number] DOB: January 03, 1961 DOA: 05/23/2023 PCP: Mechele Claude, MD   LOS: 7 days   Brief narrative: Patient is a 63 year old male, with past medical history significant for morbid obesity, hypertension, type 2 diabetes mellitus, OSA with CPAP intolerance, history of DVT and PE on Xarelto, PAF, chronic HFpEF, and recent admission with LLQ intra-abdominal abscess, colocutaneous fistula, and abdominal wall abscess.  Patient presented to hospital with generalized weakness and malaise.  Of note patient had been admitted to the hospital 04/10/2023 and IR had a drain placed in the left lower quadrant abscess.  Patient was seen by ID and was discharged on Bactrim and Augmentin.  Patient developed malaise weakness lack of appetite and lightheadedness 3 to 4 days prior to admission.  On presentation to ED, patient was afebrile.  Labs revealed serum creatinine elevation at 2.9, and albumin of 2.6, and no leukocytosis.  Lactate was elevated at 3.1.  CT demonstrated percutaneous pigtail catheter in the left anterior abdominal wall with interval resolution/decrease in abscess at the catheter tip, but with persistent air-fluid collection along the catheter tract concerning for abdominal wall abscess measuring up to 6.1 cm.  Blood cultures were drawn and patient received 1 L of IV fluids.  General surgery was consulted and patient was admitted hospital for further evaluation and treatment.  05/25/2023: Patient seen.  No new complaints.  Discussed with the surgery team.  Surgery team plans to optimize patient prior to surgery.  TPN is being considered (we defer to the surgical team).  05/30/2023: Patient seen.  No new changes.  Discussed with the surgery team.  Surgery team plans to proceed with surgery tomorrow.       Assessment/Plan: Principal Problem:   Acute renal failure superimposed on stage 3 chronic kidney disease, unspecified acute renal failure type, unspecified  whether stage 3a or 3b CKD (HCC) Active Problems:   Chronic heart failure with preserved ejection fraction (HFpEF) (HCC)   Severe obstructive sleep apnea-hypopnea syndrome   Sleep related hypoxia   Type 2 diabetes mellitus with hyperglycemia, without long-term current use of insulin (HCC)   Paroxysmal atrial fibrillation (HCC)   History of pulmonary embolus (PE)   Abdominal wall abscess   Malnutrition of moderate degree    AKI superimposed on CKD 3a versus acute kidney injury: -Serum creatinine on presentation was 2.9, from baseline around 1.4-1.5. -CT scan did not show any hydronephrosis. -Continue to hold nephrotoxic medication. -Hold metformin and Farxiga.   -Patient is currently on TPN, for optimization prior to surgery.   -AKI has resolved.     Abdominal wall abscess; intraabdominal abscess; colocutaneous fistula   -Patient underwent IR guided drain exchange on 05/22/2024.  -Blood cultures have not grown any organism. -Patient is currently on IV Rocephin and oral Flagyl.   -General Surgery plans to proceed with surgery tomorrow.     Paroxysmal atrial fibrillation. Patient is on amiodarone 200 Mg p.o. once daily. Heparin is on hold.   Chronic HFpEF  - Appears compensated at this time.  Continue to hold Farxiga    Hx of DVT/PE  Heparin is on hold. Patient was on Xarelto prior to presentation. For surgery tomorrow.   Type II DM  Hemoglobin A1c was 6.5% in January 2025.  Continue blood glucose levels, sliding scale insulin.  DVT prophylaxis:    Disposition: Uncertain at this time.  This will depend on hospital course.  Status is: Inpatient Remains inpatient appropriate because: IV antibiotic, continued drainage, pending clinical  improvement, surgical evaluation    Code Status:     Code Status: Full Code  Family Communication: None at bedside  Consultants: General surgery Interventional radiology  Procedures: Exchange of drain  catheter.  Anti-infectives:  Rocephin and Flagyl.  Anti-infectives (From admission, onward)    Start     Dose/Rate Route Frequency Ordered Stop   05/27/23 1000  metroNIDAZOLE (FLAGYL) tablet 500 mg        500 mg Oral Every 12 hours 05/27/23 0722     05/23/23 2315  cefTRIAXone (ROCEPHIN) 2 g in sodium chloride 0.9 % 100 mL IVPB        2 g 200 mL/hr over 30 Minutes Intravenous Every 24 hours 05/23/23 2305     05/23/23 2315  metroNIDAZOLE (FLAGYL) IVPB 500 mg  Status:  Discontinued        500 mg 100 mL/hr over 60 Minutes Intravenous Every 12 hours 05/23/23 2305 05/27/23 0722        Subjective: -Patient seen. -No new complaints.  Objective: Vitals:   05/30/23 0813 05/30/23 1702  BP: 126/77 136/86  Pulse: 82 85  Resp: 17 17  Temp: 97.6 F (36.4 C) 98 F (36.7 C)  SpO2: 96% 96%    Intake/Output Summary (Last 24 hours) at 05/30/2023 1751 Last data filed at 05/30/2023 1548 Gross per 24 hour  Intake 3 ml  Output 650 ml  Net -647 ml   Filed Weights   05/29/23 0500 05/29/23 1100 05/30/23 0500  Weight: (!) 150 kg (!) 150 kg (!) 149.3 kg   Body mass index is 44.64 kg/m.   Physical Exam:  GENERAL: Patient is morbidly obese.  Patient is not in any distress. HEENT: Patient is pale.  No jaundice. Neck: Supple.  JVD difficult to assess. CVS: S1-S2. Abdomen: Obese, soft and nontender.  Patient has a drain in the colostomy bag. Neuro: Awake and alert.  Moves all extremities. Extremities: Minimal ankle edema.   Data Review: I have personally reviewed the following laboratory data and studies,  CBC: Recent Labs  Lab 05/24/23 2326 05/26/23 0306 05/27/23 0423 05/28/23 0318 05/30/23 0303  WBC 5.9 7.6 7.4 12.1* 11.5*  HGB 10.5* 10.0* 9.9* 10.2* 9.9*  HCT 32.7* 31.2* 30.6* 32.0* 30.5*  MCV 94.8 96.3 96.2 97.0 95.9  PLT 393 387 380 411* 389   Basic Metabolic Panel: Recent Labs  Lab 05/24/23 2326 05/26/23 0306 05/27/23 0423 05/28/23 0318 05/29/23 0338  NA 136  134* 134* 134* 133*  K 4.4 3.8 4.2 4.3 4.1  CL 98 96* 98 98 99  CO2 27 26 27 25 26   GLUCOSE 148* 175* 145* 178* 161*  BUN 39* 29* 31* 35* 34*  CREATININE 2.04* 1.47* 1.27* 1.23 1.12  CALCIUM 9.0 8.9 9.0 9.1 9.3  MG 2.0 1.8 2.0  --  1.8  PHOS  --  3.7 3.0  --  2.7   Liver Function Tests: Recent Labs  Lab 05/26/23 0306 05/29/23 0338  AST 14* 18  ALT 11 15  ALKPHOS 50 43  BILITOT 0.3 0.2  PROT 6.1* 6.2*  ALBUMIN 2.3* 2.4*   No results for input(s): "LIPASE", "AMYLASE" in the last 168 hours.  No results for input(s): "AMMONIA" in the last 168 hours. Cardiac Enzymes: No results for input(s): "CKTOTAL", "CKMB", "CKMBINDEX", "TROPONINI" in the last 168 hours. BNP (last 3 results) Recent Labs    04/28/23 0102 04/29/23 2204  BNP 30.3 71.9    ProBNP (last 3 results) No results for input(s): "PROBNP" in the  last 8760 hours.  CBG: Recent Labs  Lab 05/29/23 1703 05/30/23 0521 05/30/23 0817 05/30/23 1236 05/30/23 1658  GLUCAP 231* 240* 218* 233* 204*   Recent Results (from the past 240 hours)  Blood culture (routine x 2)     Status: None   Collection Time: 05/23/23  4:39 PM   Specimen: BLOOD LEFT ARM  Result Value Ref Range Status   Specimen Description BLOOD LEFT ARM  Final   Special Requests   Final    BOTTLES DRAWN AEROBIC AND ANAEROBIC Blood Culture adequate volume   Culture   Final    NO GROWTH 5 DAYS Performed at [facility], [address], [place], [place] [address]    Report Status 05/28/2023 FINAL  Final  Resp panel by RT-PCR (RSV, Flu A&B, Covid)     Status: None   Collection Time: 05/23/23  4:39 PM   Specimen: Nasal Swab  Result Value Ref Range Status   SARS Coronavirus 2 by RT PCR NEGATIVE NEGATIVE Final   Influenza A by PCR NEGATIVE NEGATIVE Final   Influenza B by PCR NEGATIVE NEGATIVE Final    Comment: (NOTE) The Xpert Xpress SARS-CoV-2/FLU/RSV plus assay is intended as an aid in the diagnosis of influenza from Nasopharyngeal swab  specimens and should not be used as a sole basis for treatment. Nasal washings and aspirates are unacceptable for Xpert Xpress SARS-CoV-2/FLU/RSV testing.  Fact Sheet for Patients: BloggerCourse.com  Fact Sheet for Healthcare Providers: SeriousBroker.it  This test is not yet approved or cleared by the [place] FDA and has been authorized for detection and/or diagnosis of SARS-CoV-2 by FDA under an Emergency Use Authorization (EUA). This EUA will remain in effect (meaning this test can be used) for the duration of the COVID-19 declaration under Section 564(b)(1) of the Act, 21 U.S.C. section 360bbb-3(b)(1), unless the authorization is terminated or revoked.     Resp Syncytial Virus by PCR NEGATIVE NEGATIVE Final    Comment: (NOTE) Fact Sheet for Patients: BloggerCourse.com  Fact Sheet for Healthcare Providers: SeriousBroker.it  This test is not yet approved or cleared by the [place] FDA and has been authorized for detection and/or diagnosis of SARS-CoV-2 by FDA under an Emergency Use Authorization (EUA). This EUA will remain in effect (meaning this test can be used) for the duration of the COVID-19 declaration under Section 564(b)(1) of the Act, 21 U.S.C. section 360bbb-3(b)(1), unless the authorization is terminated or revoked.  Performed at [facility], [address] [place], [place] [address]   Blood culture (routine x 2)     Status: None   Collection Time: 05/23/23  4:43 PM   Specimen: BLOOD RIGHT ARM  Result Value Ref Range Status   Specimen Description BLOOD RIGHT ARM  Final   Special Requests   Final    BOTTLES DRAWN AEROBIC AND ANAEROBIC Blood Culture adequate volume   Culture   Final    NO GROWTH 5 DAYS Performed at [facility], [address] [place], [place] [address]    Report Status 05/28/2023 FINAL  Final     Studies: No  results found.   Time spent: 35 minutes.   Barnetta Chapel, MD  [facility] 05/30/2023  If 7PM-7AM, please contact night-coverage

## 2023-05-30 NOTE — Plan of Care (Signed)

## 2023-05-30 NOTE — Plan of Care (Signed)
  Problem: Fluid Volume: Goal: Ability to maintain a balanced intake and output will improve Outcome: Progressing   Problem: Skin Integrity: Goal: Risk for impaired skin integrity will decrease Outcome: Progressing   Problem: Activity: Goal: Risk for activity intolerance will decrease Outcome: Progressing   Problem: Safety: Goal: Ability to remain free from injury will improve Outcome: Progressing

## 2023-05-30 NOTE — Progress Notes (Signed)
 PHARMACY - TOTAL PARENTERAL NUTRITION CONSULT NOTE   Indication:  chronic malnutrition chronic colocutaneous fistula/ subcutaneous abscess   Patient Measurements: Height: 6' (182.9 cm) Weight: (!) 149.3 kg (329 lb 2.4 oz) (bed) IBW/kg (Calculated) : 77.6 TPN AdjBW (KG): 95.7 Body mass index is 44.64 kg/m. Usual Weight: 152-155 kg (fluctuating over the past 6 months; 170 kg 04/2022) 2/26 standing weight 134 kg (confirmed with RN and patient) 3/01 standing wt =150.2kg per RN   Assessment:  63 yo male with with medical history significant for hypertension, type 2 diabetes mellitus, history of DVT/PE on Xarelto, PAF, HFpEF with a recent admission for LLQ intra-abdominal abscess, colocutaneous fistula, and abdominal wall abscess on 04/27/2023, had IR drain placed into the LLQ abscess and the intra-abdominal abscess aspirated on 1/31. He returned to the ED with concerns that the drain in his abdomen was not functioning and now is in need of surgery with a possible colostomy. Per chart review, this has been going on for 8 months resulting in about 30 lbs weight loss. Of note, bed weights are significantly higher than standing weights. Per surgery, allow patient to eat freely by mouth. Patient has been eating 100% of eggs, sausage, cereal, toast, orange juice and coffee for breakfast and spaghetti, [place] and gravy, green beans, creamed potatoes for lunch/dinner. As of 2/28, patient is constipated, last BM PTA. Pharmacy consulted to start TPN to optimize nutrition prior to surgery. Earliest surgery may be is week of 3/3. Surgery team spoke with patient, now optimized to proceed with surgery but patient remains undecided.   Note, patient is a hard stick so labs are being drawn from PICC line. Asked for standing weights as bed weights are inaccurate.  Glucose / Insulin: A1c 03/2023 6.5%; BG 154-199, 5 units SSI used/24 hrs Electrolytes: Na 133, K 4.1, Mg 1.8, Phos 2.7, others wnl Renal:  Scr 1.12 (BL  ~1.5), BUN 34 Hepatic: Alk phos/ALT/Tbili wnl, prealbumin up 15 > 22, albumin 2.4, TG 58 Intake / Output; MIVF: UOP 0.9 ml/kg/hr, drain 75ml. LBM 3/02   GI Imaging:  2/25 CT: previously identified intraabdominal abscess decreased with drain, air-fluid collection in tissue concerning for abd wall abscess  GI Surgeries / Procedures:  none since TPN initiation  Central access: 05/25/23 PICC TPN start date: 05/25/23   Nutritional Goals: Goal Clinimix 8/10 at 50ml/hr = daily provides 160g AA and 1325 kcal/d  RD Assessment: discussed with RD 2/28, given significant PO intake, will meet 80- 100% protein needs and 50-60% of Kcal needs via Clinimix  Estimated Needs Total Energy Estimated Needs: 2400-2600 kcal Total Protein Estimated Needs: 160-180 gm Total Fluid Estimated Needs: >2L/day  Current Nutrition:  TPN  2/27 FLD- ate 100% 2/28 heart diet - eating 100% of meals   Plan:  Clinimix 8/10 without lytes daily, provides 160g AA and 1325 kcal/d, meeting 100% of AA and 55% of kcal needs  Will hold off on SMOF lipid given significant PO intake and no risk for EFAD  No standard MVI and trace elements to TPN, give as PO  Continue moderate SSI TID with meals  Monitor TPN labs on Mon/Thurs, and PRN F/u surgery plans   Thank you for allowing pharmacy to be a part of this patient's care.  Thelma Barge, PharmD, BCPS Clinical Pharmacist

## 2023-05-30 NOTE — Progress Notes (Signed)
 Subjective: CC: No acute changes. States he is concerned about the aftercare and possibility of SNF but would like to proceed with surgery  Objective: Vital signs in last 24 hours: Temp:  [97.6 F (36.4 C)-97.8 F (36.6 C)] 97.6 F (36.4 C) (03/04 0813) Pulse Rate:  [82-92] 82 (03/04 0813) Resp:  [17] 17 (03/04 0813) BP: (122-162)/(66-79) 126/77 (03/04 0813) SpO2:  [96 %-97 %] 96 % (03/04 0813) Weight:  [149.3 kg-150 kg] 149.3 kg (03/04 0500) Last BM Date : 05/30/23  Intake/Output from previous day: 03/03 0701 - 03/04 0700 In: 2856.3 [P.O.:360; I.V.:2496.3] Out: 1200 [Urine:1200] Intake/Output this shift: Total I/O In: -  Out: 275 [Urine:275]  PE: Gen:  Alert, NAD, pleasant Abd: Soft, ND, generalized ttp, eakins bag with IR drain and feculent output.   Lab Results:  Recent Labs    05/28/23 0318 05/30/23 0303  WBC 12.1* 11.5*  HGB 10.2* 9.9*  HCT 32.0* 30.5*  PLT 411* 389   BMET Recent Labs    05/28/23 0318 05/29/23 0338  NA 134* 133*  K 4.3 4.1  CL 98 99  CO2 25 26  GLUCOSE 178* 161*  BUN 35* 34*  CREATININE 1.23 1.12  CALCIUM 9.1 9.3   PT/INR No results for input(s): "LABPROT", "INR" in the last 72 hours. CMP     Component Value Date/Time   NA 133 (L) 05/29/2023 0338   NA 142 02/21/2023 1544   K 4.1 05/29/2023 0338   CL 99 05/29/2023 0338   CO2 26 05/29/2023 0338   GLUCOSE 161 (H) 05/29/2023 0338   BUN 34 (H) 05/29/2023 0338   BUN 18 02/21/2023 1544   CREATININE 1.12 05/29/2023 0338   CREATININE 1.50 (H) 05/11/2023 0300   CALCIUM 9.3 05/29/2023 0338   PROT 6.2 (L) 05/29/2023 0338   PROT 7.1 02/21/2023 1544   ALBUMIN 2.4 (L) 05/29/2023 0338   ALBUMIN 3.8 (L) 02/21/2023 1544   AST 18 05/29/2023 0338   ALT 15 05/29/2023 0338   ALKPHOS 43 05/29/2023 0338   BILITOT 0.2 05/29/2023 0338   BILITOT 0.3 02/21/2023 1544   GFRNONAA >60 05/29/2023 0338   GFRAA 68 12/16/2019 0000   Lipase     Component Value Date/Time   LIPASE 25  05/23/2023 1639    Studies/Results: No results found.  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 This has been going on for almost a year now. Initially seen by General Surgery at [facility] 06/19/22.  He has lost over 100 pounds in the last year. He still has significant comorbidities of a fib, DM II, CHF, h/o stroke, OSA, h/o PE 2019.  BMI 45   - Cards saw last admission "Small area of distal anteroapical  reversible ischemia - possibly artifact, but with some apical hypokinesis and LVEF 49%, may be ischemia - if so, could be distal LAD".  Recommended for medical therapy with No cath this time.  Can hold Eliquis 2 days prior to surgery.  - Cards reviewed chart and no interventions or studies recommended prior to surgery. RCRI is 15% for major cardiovascular events  - Abx per ID - Drain per IR - placed 1/31 and now upsized 2/25  - Continue TPN and ensure  - Pre-alb improved to 22. Alb 2.4 - WOC following, appreciate recs for pouching around drain.  - OR today vs tomorrow.    FEN - NPO. TPN VTE - Heparin gtt. Will discuss timing of holding with MD  ID - Rocephin/Flagyl   I reviewed nursing notes, last 24 h vitals and pain scores, last 48 h intake and output, last 24 h labs and trends, and last 24 h imaging results.   LOS: 7 days    Eric Form, [facility]  Surgery 05/30/2023, 9:20 AM Please see Amion for pager number during day hours 7:00am-4:30pm

## 2023-05-31 ENCOUNTER — Inpatient Hospital Stay (HOSPITAL_COMMUNITY)

## 2023-05-31 ENCOUNTER — Ambulatory Visit: Payer: Medicaid Other | Admitting: Internal Medicine

## 2023-05-31 ENCOUNTER — Encounter (HOSPITAL_COMMUNITY)
Admission: EM | Disposition: A | Discharge status: Skilled Nursing Facility | Payer: Self-pay | Source: Home / Self Care | Attending: Internal Medicine

## 2023-05-31 ENCOUNTER — Encounter (HOSPITAL_COMMUNITY): Payer: Self-pay | Admitting: Family Medicine

## 2023-05-31 ENCOUNTER — Inpatient Hospital Stay (HOSPITAL_COMMUNITY): Admitting: Certified Registered Nurse Anesthetist

## 2023-05-31 ENCOUNTER — Other Ambulatory Visit: Payer: Self-pay

## 2023-05-31 DIAGNOSIS — I4891 Unspecified atrial fibrillation: Secondary | ICD-10-CM

## 2023-05-31 DIAGNOSIS — Z931 Gastrostomy status: Secondary | ICD-10-CM | POA: Diagnosis not present

## 2023-05-31 DIAGNOSIS — N183 Chronic kidney disease, stage 3 unspecified: Secondary | ICD-10-CM | POA: Diagnosis not present

## 2023-05-31 DIAGNOSIS — E1165 Type 2 diabetes mellitus with hyperglycemia: Secondary | ICD-10-CM | POA: Diagnosis not present

## 2023-05-31 DIAGNOSIS — K651 Peritoneal abscess: Secondary | ICD-10-CM | POA: Diagnosis not present

## 2023-05-31 DIAGNOSIS — I110 Hypertensive heart disease with heart failure: Secondary | ICD-10-CM

## 2023-05-31 DIAGNOSIS — K660 Peritoneal adhesions (postprocedural) (postinfection): Secondary | ICD-10-CM | POA: Diagnosis not present

## 2023-05-31 DIAGNOSIS — K632 Fistula of intestine: Secondary | ICD-10-CM | POA: Diagnosis not present

## 2023-05-31 DIAGNOSIS — L02211 Cutaneous abscess of abdominal wall: Secondary | ICD-10-CM | POA: Diagnosis not present

## 2023-05-31 DIAGNOSIS — N1832 Chronic kidney disease, stage 3b: Secondary | ICD-10-CM | POA: Diagnosis not present

## 2023-05-31 DIAGNOSIS — K630 Abscess of intestine: Secondary | ICD-10-CM | POA: Diagnosis not present

## 2023-05-31 DIAGNOSIS — R0989 Other specified symptoms and signs involving the circulatory and respiratory systems: Secondary | ICD-10-CM | POA: Diagnosis not present

## 2023-05-31 DIAGNOSIS — Z978 Presence of other specified devices: Secondary | ICD-10-CM

## 2023-05-31 DIAGNOSIS — I952 Hypotension due to drugs: Secondary | ICD-10-CM

## 2023-05-31 DIAGNOSIS — Z4682 Encounter for fitting and adjustment of non-vascular catheter: Secondary | ICD-10-CM | POA: Diagnosis not present

## 2023-05-31 DIAGNOSIS — I130 Hypertensive heart and chronic kidney disease with heart failure and stage 1 through stage 4 chronic kidney disease, or unspecified chronic kidney disease: Secondary | ICD-10-CM | POA: Diagnosis not present

## 2023-05-31 DIAGNOSIS — N179 Acute kidney failure, unspecified: Secondary | ICD-10-CM | POA: Diagnosis not present

## 2023-05-31 DIAGNOSIS — I2699 Other pulmonary embolism without acute cor pulmonale: Secondary | ICD-10-CM

## 2023-05-31 DIAGNOSIS — I5032 Chronic diastolic (congestive) heart failure: Secondary | ICD-10-CM | POA: Diagnosis not present

## 2023-05-31 DIAGNOSIS — Z452 Encounter for adjustment and management of vascular access device: Secondary | ICD-10-CM | POA: Diagnosis not present

## 2023-05-31 DIAGNOSIS — I5030 Unspecified diastolic (congestive) heart failure: Secondary | ICD-10-CM | POA: Diagnosis not present

## 2023-05-31 DIAGNOSIS — I959 Hypotension, unspecified: Secondary | ICD-10-CM

## 2023-05-31 DIAGNOSIS — E1122 Type 2 diabetes mellitus with diabetic chronic kidney disease: Secondary | ICD-10-CM | POA: Diagnosis not present

## 2023-05-31 DIAGNOSIS — K658 Other peritonitis: Secondary | ICD-10-CM | POA: Diagnosis not present

## 2023-05-31 DIAGNOSIS — K5720 Diverticulitis of large intestine with perforation and abscess without bleeding: Secondary | ICD-10-CM | POA: Diagnosis not present

## 2023-05-31 HISTORY — PX: LAPAROTOMY: SHX154

## 2023-05-31 HISTORY — PX: COLOSTOMY REVISION: SHX5232

## 2023-05-31 HISTORY — PX: APPLICATION OF WOUND VAC: SHX5189

## 2023-05-31 HISTORY — PX: BOWEL RESECTION: SHX1257

## 2023-05-31 HISTORY — PX: DEBRIDEMENT OF ABDOMINAL WALL ABSCESS: SHX6396

## 2023-05-31 HISTORY — PX: COLON RESECTION SIGMOID: SHX6737

## 2023-05-31 HISTORY — PX: COLOSTOMY: SHX63

## 2023-05-31 LAB — POCT I-STAT 7, (LYTES, BLD GAS, ICA,H+H)
Acid-base deficit: 1 mmol/L (ref 0.0–2.0)
Acid-base deficit: 4 mmol/L — ABNORMAL HIGH (ref 0.0–2.0)
Acid-base deficit: 1 mmol/L (ref 0.0–2.0)
Bicarbonate: 22.5 mmol/L (ref 20.0–28.0)
Bicarbonate: 24.7 mmol/L (ref 20.0–28.0)
Bicarbonate: 23.6 mmol/L (ref 20.0–28.0)
Calcium, Ion: 1.23 mmol/L (ref 1.15–1.40)
Calcium, Ion: 1.34 mmol/L (ref 1.15–1.40)
Calcium, Ion: 1.25 mmol/L (ref 1.15–1.40)
HCT: 28 % — ABNORMAL LOW (ref 39.0–52.0)
HCT: 28 % — ABNORMAL LOW (ref 39.0–52.0)
HCT: 29 % — ABNORMAL LOW (ref 39.0–52.0)
Hemoglobin: 9.5 g/dL — ABNORMAL LOW (ref 13.0–17.0)
Hemoglobin: 9.5 g/dL — ABNORMAL LOW (ref 13.0–17.0)
Hemoglobin: 9.9 g/dL — ABNORMAL LOW (ref 13.0–17.0)
O2 Saturation: 100 %
O2 Saturation: 98 %
O2 Saturation: 99 %
Patient temperature: 36.3
Patient temperature: 36.3
Patient temperature: 97.8
Potassium: 4.8 mmol/L (ref 3.5–5.1)
Potassium: 5.1 mmol/L (ref 3.5–5.1)
Potassium: 4.9 mmol/L (ref 3.5–5.1)
Sodium: 133 mmol/L — ABNORMAL LOW (ref 135–145)
Sodium: 136 mmol/L (ref 135–145)
Sodium: 134 mmol/L — ABNORMAL LOW (ref 135–145)
TCO2: 24 mmol/L (ref 22–32)
TCO2: 26 mmol/L (ref 22–32)
TCO2: 25 mmol/L (ref 22–32)
pCO2 arterial: 43.8 mmHg (ref 32–48)
pCO2 arterial: 47.1 mmHg (ref 32–48)
pCO2 arterial: 37.8 mmHg (ref 32–48)
pH, Arterial: 7.284 — ABNORMAL LOW (ref 7.35–7.45)
pH, Arterial: 7.357 (ref 7.35–7.45)
pH, Arterial: 7.401 (ref 7.35–7.45)
pO2, Arterial: 117 mmHg — ABNORMAL HIGH (ref 83–108)
pO2, Arterial: 176 mmHg — ABNORMAL HIGH (ref 83–108)
pO2, Arterial: 118 mmHg — ABNORMAL HIGH (ref 83–108)

## 2023-05-31 LAB — TYPE AND SCREEN
ABO/RH(D): A POS
Antibody Screen: NEGATIVE

## 2023-05-31 LAB — POCT I-STAT, CHEM 8
BUN: 58 mg/dL — ABNORMAL HIGH (ref 8–23)
BUN: 36 mg/dL — ABNORMAL HIGH (ref 8–23)
BUN: 38 mg/dL — ABNORMAL HIGH (ref 8–23)
Calcium, Ion: 1.27 mmol/L (ref 1.15–1.40)
Calcium, Ion: 1.35 mmol/L (ref 1.15–1.40)
Calcium, Ion: 1.37 mmol/L (ref 1.15–1.40)
Chloride: 106 mmol/L (ref 98–111)
Chloride: 104 mmol/L (ref 98–111)
Chloride: 103 mmol/L (ref 98–111)
Creatinine, Ser: 1.3 mg/dL — ABNORMAL HIGH (ref 0.61–1.24)
Creatinine, Ser: 1.1 mg/dL (ref 0.61–1.24)
Creatinine, Ser: 1.1 mg/dL (ref 0.61–1.24)
Glucose, Bld: 259 mg/dL — ABNORMAL HIGH (ref 70–99)
Glucose, Bld: 259 mg/dL — ABNORMAL HIGH (ref 70–99)
Glucose, Bld: 246 mg/dL — ABNORMAL HIGH (ref 70–99)
HCT: 35 % — ABNORMAL LOW (ref 39.0–52.0)
HCT: 33 % — ABNORMAL LOW (ref 39.0–52.0)
HCT: 30 % — ABNORMAL LOW (ref 39.0–52.0)
Hemoglobin: 11.9 g/dL — ABNORMAL LOW (ref 13.0–17.0)
Hemoglobin: 11.2 g/dL — ABNORMAL LOW (ref 13.0–17.0)
Hemoglobin: 10.2 g/dL — ABNORMAL LOW (ref 13.0–17.0)
Potassium: 6 mmol/L — ABNORMAL HIGH (ref 3.5–5.1)
Potassium: 4.6 mmol/L (ref 3.5–5.1)
Potassium: 4.7 mmol/L (ref 3.5–5.1)
Sodium: 131 mmol/L — ABNORMAL LOW (ref 135–145)
Sodium: 133 mmol/L — ABNORMAL LOW (ref 135–145)
Sodium: 132 mmol/L — ABNORMAL LOW (ref 135–145)
TCO2: 26 mmol/L (ref 22–32)
TCO2: 22 mmol/L (ref 22–32)
TCO2: 24 mmol/L (ref 22–32)

## 2023-05-31 LAB — GLUCOSE, CAPILLARY
Glucose-Capillary: 228 mg/dL — ABNORMAL HIGH (ref 70–99)
Glucose-Capillary: 231 mg/dL — ABNORMAL HIGH (ref 70–99)
Glucose-Capillary: 240 mg/dL — ABNORMAL HIGH (ref 70–99)
Glucose-Capillary: 261 mg/dL — ABNORMAL HIGH (ref 70–99)
Glucose-Capillary: 287 mg/dL — ABNORMAL HIGH (ref 70–99)

## 2023-05-31 LAB — HEPARIN LEVEL (UNFRACTIONATED): Heparin Unfractionated: <0.1 [IU]/mL — ABNORMAL LOW (ref 0.30–0.70)

## 2023-05-31 LAB — MRSA NEXT GEN BY PCR, NASAL: MRSA by PCR Next Gen: NOT DETECTED

## 2023-05-31 LAB — ABO/RH: ABO/RH(D): A POS

## 2023-05-31 SURGERY — LAPAROTOMY, EXPLORATORY
Anesthesia: General | Site: Abdomen

## 2023-05-31 MED ORDER — SODIUM CHLORIDE 0.9 % IV SOLN
250.0000 mL | INTRAVENOUS | Status: DC
  Administered 2023-05-31: 250 mL via INTRAVENOUS

## 2023-05-31 MED ORDER — INSULIN ASPART 100 UNIT/ML IJ SOLN
0.0000 [IU] | INTRAMUSCULAR | Status: AC | PRN
  Administered 2023-05-31 (×2): 8 [IU] via SUBCUTANEOUS
  Filled 2023-05-31: qty 1

## 2023-05-31 MED ORDER — HYDROMORPHONE HCL 1 MG/ML IJ SOLN
0.5000 mg | INTRAMUSCULAR | Status: DC | PRN
  Administered 2023-06-01 – 2023-06-03 (×10): 1 mg via INTRAVENOUS
  Filled 2023-05-31 (×11): qty 1

## 2023-05-31 MED ORDER — MIDAZOLAM HCL 5 MG/5ML IJ SOLN
INTRAMUSCULAR | Status: DC | PRN
  Administered 2023-05-31: 2 mg via INTRAVENOUS

## 2023-05-31 MED ORDER — ACETAMINOPHEN 10 MG/ML IV SOLN
1000.0000 mg | Freq: Four times a day (QID) | INTRAVENOUS | Status: AC
  Administered 2023-05-31 – 2023-06-01 (×4): 1000 mg via INTRAVENOUS
  Filled 2023-05-31 (×4): qty 100

## 2023-05-31 MED ORDER — SODIUM CHLORIDE (PF) 0.9 % IJ SOLN
INTRAMUSCULAR | Status: AC
  Filled 2023-05-31: qty 10

## 2023-05-31 MED ORDER — ORAL CARE MOUTH RINSE: 15.0000 mL | Freq: Once | OROMUCOSAL | Status: AC

## 2023-05-31 MED ORDER — KETAMINE HCL 50 MG/5ML IJ SOSY
PREFILLED_SYRINGE | INTRAMUSCULAR | Status: AC
  Filled 2023-05-31: qty 5

## 2023-05-31 MED ORDER — DOCUSATE SODIUM 50 MG/5ML PO LIQD: 100.0000 mg | Freq: Two times a day (BID) | ORAL | Status: DC

## 2023-05-31 MED ORDER — ROCURONIUM BROMIDE 10 MG/ML (PF) SYRINGE
PREFILLED_SYRINGE | INTRAVENOUS | Status: DC | PRN
  Administered 2023-05-31 (×2): 10 mg via INTRAVENOUS
  Administered 2023-05-31: 100 mg via INTRAVENOUS

## 2023-05-31 MED ORDER — FENTANYL CITRATE PF 50 MCG/ML IJ SOSY
50.0000 ug | PREFILLED_SYRINGE | Freq: Once | INTRAMUSCULAR | Status: AC
  Administered 2023-05-31: 50 ug via INTRAVENOUS
  Filled 2023-05-31: qty 1

## 2023-05-31 MED ORDER — LACTATED RINGERS IV SOLN: INTRAVENOUS | Status: DC

## 2023-05-31 MED ORDER — VASOPRESSIN 20 UNIT/ML IV SOLN
INTRAVENOUS | Status: DC | PRN
  Administered 2023-05-31: 1 [IU] via INTRAVENOUS

## 2023-05-31 MED ORDER — PHENYLEPHRINE 80 MCG/ML (10ML) SYRINGE FOR IV PUSH (FOR BLOOD PRESSURE SUPPORT)
PREFILLED_SYRINGE | INTRAVENOUS | Status: DC | PRN
  Administered 2023-05-31 (×2): 160 ug via INTRAVENOUS
  Administered 2023-05-31: 80 ug via INTRAVENOUS
  Administered 2023-05-31: 160 ug via INTRAVENOUS
  Administered 2023-05-31: 80 ug via INTRAVENOUS
  Administered 2023-05-31: 160 ug via INTRAVENOUS

## 2023-05-31 MED ORDER — TRAVASOL 10 % IV SOLN
INTRAVENOUS | Status: AC
  Filled 2023-05-31: qty 1600.2

## 2023-05-31 MED ORDER — FENTANYL CITRATE (PF) 250 MCG/5ML IJ SOLN
INTRAMUSCULAR | Status: DC | PRN
  Administered 2023-05-31 (×4): 50 ug via INTRAVENOUS

## 2023-05-31 MED ORDER — FAMOTIDINE 20 MG PO TABS
20.0000 mg | ORAL_TABLET | Freq: Two times a day (BID) | ORAL | Status: DC
Start: 2023-05-31 — End: 2023-05-31

## 2023-05-31 MED ORDER — METRONIDAZOLE 500 MG/100ML IV SOLN
500.0000 mg | Freq: Two times a day (BID) | INTRAVENOUS | Status: DC
  Filled 2023-05-31: qty 100

## 2023-05-31 MED ORDER — NOREPINEPHRINE 4 MG/250ML-% IV SOLN
2.0000 ug/min | INTRAVENOUS | Status: DC
  Administered 2023-05-31: 2 ug/min via INTRAVENOUS
  Filled 2023-05-31: qty 250

## 2023-05-31 MED ORDER — SODIUM BICARBONATE 8.4 % IV SOLN
INTRAVENOUS | Status: AC
  Filled 2023-05-31: qty 50

## 2023-05-31 MED ORDER — PHENYLEPHRINE 80 MCG/ML (10ML) SYRINGE FOR IV PUSH (FOR BLOOD PRESSURE SUPPORT)
PREFILLED_SYRINGE | INTRAVENOUS | Status: AC
  Filled 2023-05-31: qty 10

## 2023-05-31 MED ORDER — ONDANSETRON HCL 4 MG/2ML IJ SOLN
INTRAMUSCULAR | Status: DC | PRN
  Administered 2023-05-31: 4 mg via INTRAVENOUS

## 2023-05-31 MED ORDER — PHENYLEPHRINE HCL-NACL 20-0.9 MG/250ML-% IV SOLN
INTRAVENOUS | Status: DC | PRN
  Administered 2023-05-31: 25 ug/min via INTRAVENOUS

## 2023-05-31 MED ORDER — ROCURONIUM BROMIDE 10 MG/ML (PF) SYRINGE
PREFILLED_SYRINGE | INTRAVENOUS | Status: AC
  Filled 2023-05-31: qty 20

## 2023-05-31 MED ORDER — FENTANYL 2500MCG IN NS 250ML (10MCG/ML) PREMIX INFUSION
50.0000 ug/h | INTRAVENOUS | Status: DC
  Administered 2023-05-31: 50 ug/h via INTRAVENOUS
  Filled 2023-05-31: qty 250

## 2023-05-31 MED ORDER — 0.9 % SODIUM CHLORIDE (POUR BTL) OPTIME
TOPICAL | Status: DC | PRN
  Administered 2023-05-31: 1000 mL
  Administered 2023-05-31: 2000 mL

## 2023-05-31 MED ORDER — INSULIN ASPART 100 UNIT/ML IJ SOLN
0.0000 [IU] | INTRAMUSCULAR | Status: DC
  Administered 2023-05-31: 7 [IU] via SUBCUTANEOUS
  Administered 2023-06-01 (×2): 11 [IU] via SUBCUTANEOUS
  Administered 2023-06-01: 7 [IU] via SUBCUTANEOUS

## 2023-05-31 MED ORDER — SUGAMMADEX SODIUM 200 MG/2ML IV SOLN
INTRAVENOUS | Status: AC
  Filled 2023-05-31: qty 2

## 2023-05-31 MED ORDER — SODIUM CHLORIDE 0.9 % IV SOLN
INTRAVENOUS | Status: AC
  Filled 2023-05-31: qty 2

## 2023-05-31 MED ORDER — FENTANYL BOLUS VIA INFUSION: 50.0000 ug | INTRAVENOUS | Status: DC | PRN

## 2023-05-31 MED ORDER — MIDAZOLAM HCL 2 MG/2ML IJ SOLN: 1.0000 mg | INTRAMUSCULAR | Status: DC | PRN

## 2023-05-31 MED ORDER — POLYETHYLENE GLYCOL 3350 17 G PO PACK: 17.0000 g | PACK | Freq: Every day | ORAL | Status: DC

## 2023-05-31 MED ORDER — KETAMINE HCL 10 MG/ML IJ SOLN
INTRAMUSCULAR | Status: DC | PRN
  Administered 2023-05-31: 20 mg via INTRAVENOUS
  Administered 2023-05-31: 10 mg via INTRAVENOUS

## 2023-05-31 MED ORDER — FENTANYL CITRATE PF 50 MCG/ML IJ SOSY
50.0000 ug | PREFILLED_SYRINGE | INTRAMUSCULAR | Status: DC | PRN
  Administered 2023-05-31 – 2023-06-02 (×6): 100 ug via INTRAVENOUS
  Filled 2023-05-31 (×7): qty 2

## 2023-05-31 MED ORDER — ORAL CARE MOUTH RINSE
15.0000 mL | OROMUCOSAL | Status: DC
  Administered 2023-05-31 – 2023-06-02 (×13): 15 mL via OROMUCOSAL

## 2023-05-31 MED ORDER — CHLORHEXIDINE GLUCONATE 0.12 % MT SOLN: 15.0000 mL | Freq: Once | OROMUCOSAL | Status: AC

## 2023-05-31 MED ORDER — FENTANYL CITRATE (PF) 250 MCG/5ML IJ SOLN
INTRAMUSCULAR | Status: AC
  Filled 2023-05-31: qty 5

## 2023-05-31 MED ORDER — PANTOPRAZOLE SODIUM 40 MG IV SOLR
40.0000 mg | Freq: Every day | INTRAVENOUS | Status: DC
  Administered 2023-05-31 – 2023-06-03 (×4): 40 mg via INTRAVENOUS
  Filled 2023-05-31 (×4): qty 10

## 2023-05-31 MED ORDER — LACTATED RINGERS IV SOLN
INTRAVENOUS | Status: AC
  Administered 2023-06-01: 100 mL/h via INTRAVENOUS

## 2023-05-31 MED ORDER — DEXMEDETOMIDINE HCL IN NACL 400 MCG/100ML IV SOLN
0.0000 ug/kg/h | INTRAVENOUS | Status: DC
  Administered 2023-05-31: 0.4 ug/kg/h via INTRAVENOUS
  Filled 2023-05-31: qty 100

## 2023-05-31 MED ORDER — PROPOFOL 10 MG/ML IV BOLUS
INTRAVENOUS | Status: AC
  Filled 2023-05-31: qty 20

## 2023-05-31 MED ORDER — HEPARIN SODIUM (PORCINE) 5000 UNIT/ML IJ SOLN: 5000.0000 [IU] | Freq: Once | INTRAMUSCULAR | Status: DC

## 2023-05-31 MED ORDER — ALBUMIN HUMAN 5 % IV SOLN: INTRAVENOUS | Status: DC | PRN

## 2023-05-31 MED ORDER — SUGAMMADEX SODIUM 200 MG/2ML IV SOLN
INTRAVENOUS | Status: DC | PRN
  Administered 2023-05-31: 400 mg via INTRAVENOUS

## 2023-05-31 MED ORDER — ORAL CARE MOUTH RINSE: 15.0000 mL | OROMUCOSAL | Status: DC | PRN

## 2023-05-31 MED ORDER — PROPOFOL 10 MG/ML IV BOLUS
INTRAVENOUS | Status: DC | PRN
  Administered 2023-05-31: 180 mg via INTRAVENOUS
  Administered 2023-05-31: 40 ug/kg/min via INTRAVENOUS

## 2023-05-31 MED ORDER — SODIUM BICARBONATE 8.4 % IV SOLN
INTRAVENOUS | Status: DC | PRN
  Administered 2023-05-31 (×2): 50 meq via INTRAVENOUS

## 2023-05-31 MED ORDER — METHOCARBAMOL 1000 MG/10ML IJ SOLN: 1000.0000 mg | Freq: Four times a day (QID) | INTRAMUSCULAR | Status: DC | PRN

## 2023-05-31 MED ORDER — PHENOL 1.4 % MT LIQD: 1.0000 | OROMUCOSAL | Status: DC | PRN

## 2023-05-31 MED ORDER — ONDANSETRON HCL 4 MG/2ML IJ SOLN
INTRAMUSCULAR | Status: AC
Start: 2023-05-31 — End: ?
  Filled 2023-05-31: qty 2

## 2023-05-31 MED ORDER — LIDOCAINE 2% (20 MG/ML) 5 ML SYRINGE
INTRAMUSCULAR | Status: DC | PRN
  Administered 2023-05-31: 100 mg via INTRAVENOUS

## 2023-05-31 MED ORDER — LACTATED RINGERS IV BOLUS
1000.0000 mL | Freq: Once | INTRAVENOUS | Status: AC
  Administered 2023-05-31: 1000 mL via INTRAVENOUS

## 2023-05-31 MED ORDER — LIDOCAINE 2% (20 MG/ML) 5 ML SYRINGE
INTRAMUSCULAR | Status: AC
Start: 2023-05-31 — End: ?
  Filled 2023-05-31: qty 5

## 2023-05-31 MED ORDER — CHLORHEXIDINE GLUCONATE 0.12 % MT SOLN
OROMUCOSAL | Status: AC
  Administered 2023-05-31: 15 mL via OROMUCOSAL
  Filled 2023-05-31: qty 15

## 2023-05-31 MED ORDER — SODIUM CHLORIDE 0.9 % IV SOLN
2.0000 g | INTRAVENOUS | Status: AC
  Administered 2023-05-31: 2 g via INTRAVENOUS

## 2023-05-31 MED ORDER — CHLORHEXIDINE GLUCONATE CLOTH 2 % EX PADS
6.0000 | MEDICATED_PAD | Freq: Once | CUTANEOUS | Status: AC
  Administered 2023-05-31: 6 via TOPICAL

## 2023-05-31 MED ORDER — MIDAZOLAM HCL 2 MG/2ML IJ SOLN
INTRAMUSCULAR | Status: AC
  Filled 2023-05-31: qty 2

## 2023-05-31 SURGICAL SUPPLY — 60 items
BAG COUNTER SPONGE SURGICOUNT (BAG) ×1 IMPLANT
BIOPATCH RED 1 DISK 7.0 (GAUZE/BANDAGES/DRESSINGS) IMPLANT
BLADE CLIPPER SURG (BLADE) IMPLANT
BNDG GAUZE DERMACEA FLUFF 4 (GAUZE/BANDAGES/DRESSINGS) IMPLANT
CANISTER SUCT 3000ML PPV (MISCELLANEOUS) ×1 IMPLANT
CANISTER WOUND CARE 500ML ATS (WOUND CARE) IMPLANT
CHLORAPREP W/TINT 26 (MISCELLANEOUS) ×1 IMPLANT
COVER SURGICAL LIGHT HANDLE (MISCELLANEOUS) ×1 IMPLANT
DRAIN CHANNEL 19F RND (DRAIN) IMPLANT
DRAPE INCISE IOBAN 66X45 STRL (DRAPES) IMPLANT
DRAPE LAPAROSCOPIC ABDOMINAL (DRAPES) ×1 IMPLANT
DRAPE WARM FLUID 44X44 (DRAPES) ×1 IMPLANT
DRSG OPSITE POSTOP 4X10 (GAUZE/BANDAGES/DRESSINGS) IMPLANT
DRSG OPSITE POSTOP 4X8 (GAUZE/BANDAGES/DRESSINGS) IMPLANT
DRSG TEGADERM 4X4.5 CHG (GAUZE/BANDAGES/DRESSINGS) IMPLANT
DRSG TEGADERM 4X4.75 (GAUZE/BANDAGES/DRESSINGS) IMPLANT
DRSG VAC GRANUFOAM LG (GAUZE/BANDAGES/DRESSINGS) IMPLANT
ELECT BLADE 6.5 EXT (BLADE) IMPLANT
ELECT CAUTERY BLADE 6.4 (BLADE) IMPLANT
ELECT REM PT RETURN 9FT ADLT (ELECTROSURGICAL) ×1 IMPLANT
ELECTRODE REM PT RTRN 9FT ADLT (ELECTROSURGICAL) ×1 IMPLANT
EVACUATOR SILICONE 100CC (DRAIN) IMPLANT
GAUZE SPONGE 4X4 12PLY STRL (GAUZE/BANDAGES/DRESSINGS) IMPLANT
GLOVE BIO SURGEON STRL SZ7 (GLOVE) ×1 IMPLANT
GLOVE BIOGEL PI IND STRL 7.5 (GLOVE) ×1 IMPLANT
GOWN STRL REUS W/ TWL LRG LVL3 (GOWN DISPOSABLE) ×2 IMPLANT
HANDLE SUCTION POOLE (INSTRUMENTS) ×1 IMPLANT
KIT BASIN OR (CUSTOM PROCEDURE TRAY) ×1 IMPLANT
KIT TURNOVER KIT B (KITS) ×1 IMPLANT
LIGASURE IMPACT 36 18CM CVD LR (INSTRUMENTS) IMPLANT
NS IRRIG 1000ML POUR BTL (IV SOLUTION) ×2 IMPLANT
PACK GENERAL/GYN (CUSTOM PROCEDURE TRAY) ×1 IMPLANT
PAD ABD 8X10 STRL (GAUZE/BANDAGES/DRESSINGS) IMPLANT
PAD ARMBOARD 7.5X6 YLW CONV (MISCELLANEOUS) ×1 IMPLANT
PENCIL SMOKE EVACUATOR (MISCELLANEOUS) ×1 IMPLANT
RELOAD PROXIMATE 75MM BLUE (ENDOMECHANICALS) ×4 IMPLANT
RELOAD STAPLE 75 3.8 BLU REG (ENDOMECHANICALS) IMPLANT
SPECIMEN JAR LARGE (MISCELLANEOUS) IMPLANT
SPONGE LAP 18X18 X RAY DECT (DISPOSABLE) IMPLANT
SPONGE T-LAP 18X18 ~~LOC~~+RFID (SPONGE) IMPLANT
STAPLER CVD CUT BL 40 RELOAD (ENDOMECHANICALS) ×1 IMPLANT
STAPLER CVD CUT BLU 40 RELOAD (ENDOMECHANICALS) IMPLANT
STAPLER GUN LINEAR PROX 60 (STAPLE) IMPLANT
STAPLER PROXIMATE 75MM BLUE (STAPLE) IMPLANT
STAPLER VISISTAT 35W (STAPLE) ×1 IMPLANT
SUCTION POOLE HANDLE (INSTRUMENTS) ×1 IMPLANT
SUT ETHILON 2 0 FS 18 (SUTURE) IMPLANT
SUT PDS AB 1 TP1 96 (SUTURE) ×2 IMPLANT
SUT PROLENE 2 0 CT2 30 (SUTURE) IMPLANT
SUT SILK 2 0 SH CR/8 (SUTURE) ×1 IMPLANT
SUT SILK 2-0 18XBRD TIE 12 (SUTURE) ×1 IMPLANT
SUT SILK 3 0 SH CR/8 (SUTURE) ×1 IMPLANT
SUT SILK 3-0 18XBRD TIE 12 (SUTURE) ×1 IMPLANT
SUT VIC AB 3-0 SH 18 (SUTURE) IMPLANT
SUT VIC AB 3-0 SH 27X BRD (SUTURE) IMPLANT
SUT VIC AB 3-0 SH 8-18 (SUTURE) IMPLANT
TAPE CLOTH 3X10 TAN LF (GAUZE/BANDAGES/DRESSINGS) IMPLANT
TOWEL GREEN STERILE (TOWEL DISPOSABLE) ×1 IMPLANT
TRAY FOLEY MTR SLVR 16FR STAT (SET/KITS/TRAYS/PACK) ×1 IMPLANT
YANKAUER SUCT BULB TIP NO VENT (SUCTIONS) IMPLANT

## 2023-05-31 NOTE — Progress Notes (Signed)
 [facility] PROGRESS NOTE  Patient: Bradley Torres NFA:[id number]   PCP: Mechele Claude, MD DOB: [birth year]   DOA: 05/23/2023   DOS: 05/31/2023    The patient underwent expiratory laparotomy with LOA, left colectomy, small bowel resection.  Postop intubated and is on pressors. Care has been transferred to ICU service for medical needs. Highly appreciate ICU team. [facility] will be happy to take over the patient once ready to transfer out ICU. Signing off.  Author: Lynden Oxford, MD [facility] 05/31/2023 4:58 PM   If 7PM-7AM, please contact night-coverage at [email or web address]

## 2023-05-31 NOTE — Consult Note (Signed)
 NAME:  Bradley Torres, MRN:  [id number], DOB:  Sep 17, 1960, LOS: 8 ADMISSION DATE:  05/23/2023, CONSULTATION DATE:  05/31/23  REFERRING MD:  Dwain Sarna, CHIEF COMPLAINT:  endotracheally intubated    History of Present Illness:  63 yo M PMH morbid obesity, HTN, OSA, PE on xarelto, Afib, HFpEF recent admission for LLQ intraabd abscess and colocutaneous fistula was admitted to [facility] 05/23/23 for generalized weakness and concerns for malfunctioning IR drain; management of abdominal wall abscess + AKI on CKD.   IR exchanged drain for LLQ abscess was upsized 2/25 prior to admission. CCS was consulted 2/26 and rec abx, eval of nutr status, medical management/optimization. He required TPN during this time. Ultimately a decision was reached for OR w CCS 3/5, for ex lap LOA et al    He remained intubated after the case, PCCM is consulted in this setting   Pertinent  Medical History  Obesity Colocutaneous fistua Intraabdominal abscess  OSA  Significant Hospital Events: Including procedures, antibiotic start and stop dates in addition to other pertinent events   2/25 admit [facility] 3/5 OR, ICU post op   Interim History / Subjective:  POD 0   Arrives to ICU on 60 prop 60 neo    Objective   Blood pressure (!) 167/75, pulse 88, temperature 97.8 F (36.6 C), temperature source Oral, resp. rate 20, height 6' (1.829 m), weight (!) 149 kg, SpO2 93%.    Vent Mode: PRVC FiO2 (%):  [40 %] 40 % Set Rate:  [16 bmp] 16 bmp Vt Set:  [620 mL] 620 mL PEEP:  [5 cmH20] 5 cmH20 Plateau Pressure:  [17 cmH20] 17 cmH20   Intake/Output Summary (Last 24 hours) at 05/31/2023 1657 Last data filed at 05/31/2023 1653 Gross per 24 hour  Intake 3170.54 ml  Output 1550 ml  Net 1620.54 ml   Filed Weights   05/30/23 0500 05/31/23 0501 05/31/23 1125  Weight: (!) 149.3 kg (!) 149 kg (!) 149 kg    Examination: General: obese M intubated sedated  HENT: NCAT ETT secured w orange tape. Anicteric sclera Lungs: Symmetrical chest  expansion, mechanically ventilated  Cardiovascular: rrr Abdomen: obese soft. JP with + serosang output. Wound vac in place  Extremities: no acute joint deformity. + PICC  Neuro: sedated. 3mm pupils  GU: foley  Resolved Hospital Problem list     Assessment & Plan:   Endotracheally intubated  Hx OSA  P -ICU for vent management -CXR ABG  -VAP, pulm hygiene   Chronic colocutaneous fistula Intraabdominal abscess -3/5-- s/p  Ex LAP + LOA, L colectomy, small bowel resection with primary anastomosis, abd wall abscess debridement, end transverse colostomy, takedown of splenic fixture  P -post op per CCS  -NPO; TPN  -rocephin flagyl. Cefotan ppx   Hypotension -medication related vs hypovolemia P -add'l 1 L LR now -periph NE  -might need add/l fluids   Hx CKD -follow renal indcies UOP  DM2 w hyperglycemia -correction via TPN + SSI   pAF HFpEF  P -amio -hep gtt on hold   PE DVT  -hep gtt on hold   Best Practice (right click and "Reselect all SmartList Selections" daily)   Diet/type: TPN DVT prophylaxis SCD Pressure ulcer(s): pressure ulcer assessment deferred  GI prophylaxis: PPI Lines: Central line Foley:  Yes, and it is still needed Code Status:  full code Last date of multidisciplinary goals of care discussion [--]  Labs   CBC: Recent Labs  Lab 05/24/23 2326 05/26/23 4098 05/27/23 0423 05/28/23 1191  05/30/23 0303 05/31/23 1214 05/31/23 1239  WBC 5.9 7.6 7.4 12.1* 11.5*  --   --   HGB 10.5* 10.0* 9.9* 10.2* 9.9* 11.9* 11.2*  HCT 32.7* 31.2* 30.6* 32.0* 30.5* 35.0* 33.0*  MCV 94.8 96.3 96.2 97.0 95.9  --   --   PLT 393 387 380 411* 389  --   --     Basic Metabolic Panel: Recent Labs  Lab 05/24/23 2326 05/26/23 0306 05/27/23 0423 05/28/23 0318 05/29/23 0338 05/31/23 1214 05/31/23 1239  NA 136 134* 134* 134* 133* 131* 133*  K 4.4 3.8 4.2 4.3 4.1 6.0* 4.6  CL 98 96* 98 98 99 106 104  CO2 27 26 27 25 26   --   --   GLUCOSE 148* 175* 145*  178* 161* 259* 259*  BUN 39* 29* 31* 35* 34* 58* 36*  CREATININE 2.04* 1.47* 1.27* 1.23 1.12 1.30* 1.10  CALCIUM 9.0 8.9 9.0 9.1 9.3  --   --   MG 2.0 1.8 2.0  --  1.8  --   --   PHOS  --  3.7 3.0  --  2.7  --   --    GFR: Estimated Creatinine Clearance: 105.9 mL/min (by C-G formula based on SCr of 1.1 mg/dL). Recent Labs  Lab 05/26/23 0306 05/27/23 0423 05/28/23 0318 05/30/23 0303  WBC 7.6 7.4 12.1* 11.5*    Liver Function Tests: Recent Labs  Lab 05/26/23 0306 05/29/23 0338  AST 14* 18  ALT 11 15  ALKPHOS 50 43  BILITOT 0.3 0.2  PROT 6.1* 6.2*  ALBUMIN 2.3* 2.4*   No results for input(s): "LIPASE", "AMYLASE" in the last 168 hours. No results for input(s): "AMMONIA" in the last 168 hours.  ABG    Component Value Date/Time   TCO2 22 05/31/2023 1239     Coagulation Profile: No results for input(s): "INR", "PROTIME" in the last 168 hours.  Cardiac Enzymes: No results for input(s): "CKTOTAL", "CKMB", "CKMBINDEX", "TROPONINI" in the last 168 hours.  HbA1C: Hemoglobin A1C  Date/Time Value Ref Range Status  04/29/2021 12:00 AM 9.0  Final   HB A1C (BAYER DCA - WAIVED)  Date/Time Value Ref Range Status  02/21/2023 03:42 PM 6.1 (H) 4.8 - 5.6 % Final    Comment:             Prediabetes: 5.7 - 6.4          Diabetes: >6.4          Glycemic control for adults with diabetes: <7.0   05/11/2022 01:41 PM 8.3 (H) 4.8 - 5.6 % Final    Comment:             Prediabetes: 5.7 - 6.4          Diabetes: >6.4          Glycemic control for adults with diabetes: <7.0    Hgb A1c MFr Bld  Date/Time Value Ref Range Status  04/28/2023 01:02 AM 6.5 (H) 4.8 - 5.6 % Final    Comment:    (NOTE) Pre diabetes:          5.7%-6.4%  Diabetes:              >6.4%  Glycemic control for   <7.0% adults with diabetes   06/19/2022 06:30 AM 8.3 (H) 4.8 - 5.6 % Final    Comment:    (NOTE)         Prediabetes: 5.7 - 6.4  Diabetes: >6.4         Glycemic control for adults with  diabetes: <7.0     CBG: Recent Labs  Lab 05/30/23 1236 05/30/23 1658 05/31/23 0801 05/31/23 1127 05/31/23 1649  GLUCAP 233* 204* 240* 261* 231*    Review of Systems:   Unable to obtain intubated sedated   Past Medical History:  He,  has a past medical history of CAD (coronary artery disease), Chronic back pain, Diabetes mellitus, type 2 (HCC), DVT (deep venous thrombosis) (HCC), Essential hypertension, Family hx of colon cancer (05/03/2017), MI (myocardial infarction) (HCC) (2019), Morbid obesity (HCC), Panic disorder, Shoulder pain, left, Sleep apnea, Stroke (HCC) (01/2018), and Vitamin D deficiency.   Surgical History:   Past Surgical History:  Procedure Laterality Date   Ankle fracture sugery Left    BIOPSY  07/20/2020   Procedure: BIOPSY;  Surgeon: Corbin Ade, MD;  Location: [facility];  Service: Endoscopy;;  cecal polyp   COLONOSCOPY WITH PROPOFOL N/A 05/29/2017   Dr. Jena Gauss: Multiple tubular adenomas removed, prep inadequate.  Short interval surveillance colonoscopy recommended in 6 months   COLONOSCOPY WITH PROPOFOL N/A 07/20/2020   Procedure: COLONOSCOPY WITH PROPOFOL;  Surgeon: Corbin Ade, MD;  Location: [facility];  Service: Endoscopy;  Laterality: N/A;  am appt, early as possible since diabetic   INTERVENTIONAL RADIOLOGY PROCEDURE  10/08/2022   JP drain placement due to diverticular abscess   IR CATHETER TUBE CHANGE  01/16/2023   IR CATHETER TUBE CHANGE  05/23/2023   IR RADIOLOGIST EVAL & MGMT  10/26/2022   IR RADIOLOGIST EVAL & MGMT  01/04/2023   IR RADIOLOGIST EVAL & MGMT  01/30/2023   IR RADIOLOGIST EVAL & MGMT  02/17/2023   LEFT HEART CATH AND CORONARY ANGIOGRAPHY N/A 01/23/2018   Procedure: LEFT HEART CATH AND CORONARY ANGIOGRAPHY;  Surgeon: Lennette Bihari, MD;  Location: [facility];  Service: Cardiovascular;  Laterality: N/A;   LUMBAR SPINE SURGERY     fusion   POLYPECTOMY  05/29/2017   Procedure: POLYPECTOMY;  Surgeon: Corbin Ade, MD;  Location: [facility];  Service: Endoscopy;;  ascending colon polyp x5-cs transverse colon polyp x4- cs descending colon polyp x1- hs sigmoid colon polyp x1 -hs rectal polyp x1- hs   TONSILLECTOMY       Social History:   reports that he has never smoked. He has never been exposed to tobacco smoke. He has never used smokeless tobacco. He reports current alcohol use. He reports that he does not currently use drugs after having used the following drugs: Marijuana.   Family History:  His family history includes Colon cancer in his mother; Heart attack in his father and paternal grandfather; Hepatitis (age of onset: 53) in his sister.   Allergies Allergies  Allergen Reactions   Aldactone [Spironolactone] Hives   Bystolic [Nebivolol Hcl] Swelling   Motrin [Ibuprofen] Other (See Comments)    Rectal bleed   Actos [Pioglitazone] Other (See Comments) and Cough    Dyspnea  Edema   Trulicity [Dulaglutide] Other (See Comments) and Cough    Shortness of breath GI intolerance   Zestril [Lisinopril] Other (See Comments) and Cough    Wheezing    Cozaar [Losartan] Rash   Diflucan [Fluconazole] Rash   Hctz [Hydrochlorothiazide] Other (See Comments)    Gout flares.   Norvasc [Amlodipine] Itching and Other (See Comments)    Myalgias      Home Medications  Prior to Admission medications  Medication Sig Start Date End Date Taking? Authorizing Provider  allopurinol (ZYLOPRIM) 100 MG tablet TAKE ONE TABLET BY MOUTH ONCE DAILY 04/04/23  Yes Stacks, Broadus John, MD  amiodarone (PACERONE) 200 MG tablet Take 1 tablet (200 mg total) by mouth daily. 01/12/23  Yes Antoine Poche, MD  atorvastatin (LIPITOR) 20 MG tablet TAKE ONE TABLET BY MOUTH ONCE DAILY 08/01/22  Yes Branch, Dorothe Pea, MD  Cholecalciferol (VITAMIN D-3 PO) Take 1 tablet by mouth daily.   Yes [provider]  dapagliflozin propanediol (FARXIGA) 10 MG TABS tablet TAKE ONE TABLET BY MOUTH DAILY BEFORE BREAKFAST 04/05/23  Yes  Stacks, Broadus John, MD  JANUVIA 100 MG tablet TAKE ONE TABLET BY MOUTH ONCE DAILY 04/05/23  Yes Mechele Claude, MD  metFORMIN (GLUCOPHAGE-XR) 750 MG 24 hr tablet Take 2 tablets (1,500 mg total) by mouth daily with breakfast. Patient taking differently: Take 750 mg by mouth daily with breakfast. 05/11/22  Yes Mechele Claude, MD  oxyCODONE-acetaminophen (PERCOCET) 10-325 MG tablet Take 1 tablet by mouth 3 (three) times daily as needed for pain. Patient taking differently: Take 1 tablet by mouth every 6 (six) hours as needed for pain. 06/28/22  Yes Osvaldo Shipper, MD  polyethylene glycol (MIRALAX / GLYCOLAX) 17 g packet Take 17 g by mouth daily. Patient taking differently: Take 17 g by mouth daily as needed (constipation.). 06/28/22  Yes Osvaldo Shipper, MD  Prasterone, DHEA, (DHEA PO) Take 1 capsule by mouth daily.   Yes [provider]  rivaroxaban (XARELTO) 20 MG TABS tablet TAKE ONE TABLET BY MOUTH DAILY WITH SUPPER 03/20/23  Yes Mechele Claude, MD     Critical care time: 38 min      CRITICAL CARE Performed by: Lanier Clam   Total critical care time: 38 minutes  Critical care time was exclusive of separately billable procedures and treating other patients. Critical care was necessary to treat or prevent imminent or life-threatening deterioration.  Critical care was time spent personally by me on the following activities: development of treatment plan with patient and/or surrogate as well as nursing, discussions with consultants, evaluation of patient's response to treatment, examination of patient, obtaining history from patient or surrogate, ordering and performing treatments and interventions, ordering and review of laboratory studies, ordering and review of radiographic studies, pulse oximetry and re-evaluation of patient's condition.  Tessie Fass MSN, AGACNP-BC [facility] Pulmonary/Critical Care Medicine Amion for pager 05/31/2023, 4:57 PM

## 2023-05-31 NOTE — Progress Notes (Signed)
   Subjective/Chief Complaint: No change, does want to proceed with surgery   Objective: Vital signs in last 24 hours: Temp:  [97.4 F (36.3 C)-98 F (36.7 C)] 97.5 F (36.4 C) (03/05 0730) Pulse Rate:  [79-92] 87 (03/05 0730) Resp:  [16-17] 16 (03/05 0730) BP: (128-136)/(62-86) 135/79 (03/05 0730) SpO2:  [96 %-97 %] 97 % (03/05 0730) Weight:  [149 kg] 149 kg (03/05 0501) Last BM Date : 05/30/23  Intake/Output from previous day: 03/04 0701 - 03/05 0700 In: 920.5 [I.V.:920.5] Out: 1850 [Urine:1850] Intake/Output this shift: No intake/output data recorded.   Gen:  Alert, NAD, pleasant Abd: Soft, ND, generalized ttp, eakins bag with IR drain and feculent output.   BMET Recent Labs    05/29/23 0338  NA 133*  K 4.1  CL 99  CO2 26  GLUCOSE 161*  BUN 34*  CREATININE 1.12  CALCIUM 9.3   PT/INR No results for input(s): "LABPROT", "INR" in the last 72 hours. ABG No results for input(s): "PHART", "HCO3" in the last 72 hours.  Invalid input(s): "PCO2", "PO2"  Studies/Results: No results found.  Anti-infectives: Anti-infectives (From admission, onward)    Start     Dose/Rate Route Frequency Ordered Stop   05/27/23 1000  metroNIDAZOLE (FLAGYL) tablet 500 mg        500 mg Oral Every 12 hours 05/27/23 0722     05/23/23 2315  cefTRIAXone (ROCEPHIN) 2 g in sodium chloride 0.9 % 100 mL IVPB        2 g 200 mL/hr over 30 Minutes Intravenous Every 24 hours 05/23/23 2305     05/23/23 2315  metroNIDAZOLE (FLAGYL) IVPB 500 mg  Status:  Discontinued        500 mg 100 mL/hr over 60 Minutes Intravenous Every 12 hours 05/23/23 2305 05/27/23 4098       Assessment/Plan: 63 yo male with 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  - plan for surgery today as discussed yesterday, he still desires to proceed    FEN - NPO. TPN VTE - Heparin gtt has been off ID - Rocephin/Flagyl   Emelia Loron 05/31/2023

## 2023-05-31 NOTE — Anesthesia Procedure Notes (Signed)
 Arterial Line Insertion Start/End3/07/2023 12:30 PM, 05/31/2023 12:45 PM Performed by: Alease Medina, CRNA, CRNA  Preanesthetic checklist: patient identified, IV checked, site marked, risks and benefits discussed, surgical consent, monitors and equipment checked, pre-op evaluation, timeout performed and anesthesia consent Lidocaine 1% used for infiltration Left, radial was placed Catheter size: 20 G Hand hygiene performed , maximum sterile barriers used  and Seldinger technique used Allen's test indicative of satisfactory collateral circulation Procedure performed without using ultrasound guided technique. Following insertion, Biopatch and dressing applied. Post procedure assessment: normal and unchanged  Patient tolerated the procedure well with no immediate complications.

## 2023-05-31 NOTE — Transfer of Care (Signed)
 Immediate Anesthesia Transfer of Care Note  Patient: Bradley Torres  Procedure(s) Performed: EXPLORATORY LAPAROTOMY (Abdomen) COLECTOMY, SIGMOID, OPEN (Abdomen) CREATION, COLOSTOMY (Abdomen) SMALL BOWEL RESECTION (Abdomen) COLECTOMY, LEFT (Abdomen) DEBRIDEMENT OF ABDOMINAL WALL (Abdomen) APPLICATION, WOUND VAC (Abdomen)  Patient Location: ICU  Anesthesia Type:General  Level of Consciousness: Patient remains intubated per anesthesia plan  Airway & Oxygen Therapy: Patient remains intubated per anesthesia plan  Post-op Assessment: Report given to RN and Post -op Vital signs reviewed and stable  Post vital signs: Reviewed and stable  Last Vitals:  Vitals Value Taken Time  BP 117/60   Temp    Pulse 81 05/31/23 1652  Resp 25 05/31/23 1652  SpO2 100 % 05/31/23 1652  Vitals shown include unfiled device data.  Last Pain:  Vitals:   05/31/23 1204  TempSrc:   PainSc: 8       Patients Stated Pain Goal: 0 (05/24/23 0403)  Complications: No notable events documented.

## 2023-05-31 NOTE — Anesthesia Procedure Notes (Signed)
 Procedure Name: Intubation Date/Time: 05/31/2023 1:45 PM  Performed by: Waynard Edwards, CRNAPre-anesthesia Checklist: Patient identified, Emergency Drugs available, Suction available and Patient being monitored Patient Re-evaluated:Patient Re-evaluated prior to induction Oxygen Delivery Method: Circle system utilized Preoxygenation: Pre-oxygenation with 100% oxygen Induction Type: IV induction Ventilation: Mask ventilation without difficulty and Oral airway inserted - appropriate to patient size Laryngoscope Size: Hyacinth Meeker and 2 Grade View: Grade II Tube type: Oral Tube size: 7.5 mm Number of attempts: 1 Airway Equipment and Method: Stylet and Oral airway Placement Confirmation: ETT inserted through vocal cords under direct vision, positive ETCO2 and breath sounds checked- equal and bilateral Secured at: 24 cm Tube secured with: Tape Dental Injury: Teeth and Oropharynx as per pre-operative assessment

## 2023-05-31 NOTE — Anesthesia Preprocedure Evaluation (Addendum)
 Anesthesia Evaluation  Patient identified by MRN, date of birth, ID band Patient awake    Reviewed: Allergy & Precautions, NPO status , Patient's Chart, lab work & pertinent test results  Airway Mallampati: II  TM Distance: >3 FB Neck ROM: Full    Dental no notable dental hx. (+) Teeth Intact, Dental Advisory Given   Pulmonary sleep apnea and Continuous Positive Airway Pressure Ventilation    Pulmonary exam normal breath sounds clear to auscultation       Cardiovascular hypertension, + Past MI (2019), +CHF (HFpEF) and + DVT (on xareleto)  Normal cardiovascular exam+ dysrhythmias Atrial Fibrillation  Rhythm:Regular Rate:Normal  05/2022 echo 1. Left ventricular ejection fraction, by estimation, is 50 to 55% -  difficuly evaluation in the setting of rapid atrial fibrillation and  variable contraction from beat to beat. There is intermittent septal  bounce in association with respiration. The  left ventricle has low normal function. The left ventricle demonstrates  global hypokinesis. Left ventricular diastolic parameters are  indeterminate.   2. Right ventricular systolic function is moderately reduced. The right  ventricular size is mildly enlarged. There is mildly elevated pulmonary  artery systolic pressure. The estimated right ventricular systolic  pressure is 42.2 mmHg.   3. Left atrial size was mildly dilated.   4. The mitral valve is grossly normal. Trivial mitral valve  regurgitation.   5. The aortic valve is tricuspid. Aortic valve regurgitation is not  visualized.   6. The inferior vena cava is dilated in size with <50% respiratory  variability, suggesting right atrial pressure of 15 mmHg.     Neuro/Psych  PSYCHIATRIC DISORDERS Anxiety        GI/Hepatic   Endo/Other  diabetes  Class 3 obesity  Renal/GU ARF and Renal InsufficiencyRenal diseaseLab Results      Component                Value               Date                       NA                       133 (L)             05/29/2023                K                        4.1                 05/29/2023                CO2                      26                  05/29/2023                BUN                      34 (H)              05/29/2023                CREATININE               1.12  05/29/2023                GFRNONAA                 >60                 05/29/2023                 PHOS                     2.7                 05/29/2023                      Musculoskeletal  (+) Arthritis ,    Abdominal   Peds  Hematology Lab Results      Component                Value               Date                      WBC                      11.5 (H)            05/30/2023                HGB                      9.9 (L)             05/30/2023                HCT                      30.5 (L)            05/30/2023                      PLT                      389                 05/30/2023              Anesthesia Other Findings All See list  Colocutaneous fistula, intraabdominal abcess  Reproductive/Obstetrics                             Anesthesia Physical Anesthesia Plan  ASA: 4  Anesthesia Plan: General   Post-op Pain Management: Ofirmev IV (intra-op)*   Induction: Intravenous  PONV Risk Score and Plan: 3 and Treatment may vary due to age or medical condition and Ondansetron  Airway Management Planned: Oral ETT  Additional Equipment: Arterial line  Intra-op Plan:   Post-operative Plan: Extubation in OR and Possible Post-op intubation/ventilation  Informed Consent: I have reviewed the patients History and Physical, chart, labs and discussed the procedure including the risks, benefits and alternatives for the proposed anesthesia with the patient or authorized representative who has indicated his/her understanding and acceptance.     Dental advisory given  Plan Discussed with: CRNA and  Surgeon  Anesthesia Plan Comments:        Anesthesia Quick Evaluation

## 2023-05-31 NOTE — Anesthesia Postprocedure Evaluation (Signed)
 Anesthesia Post Note  Patient: Bradley Torres  Procedure(s) Performed: EXPLORATORY LAPAROTOMY (Abdomen) COLECTOMY, SIGMOID, OPEN (Abdomen) CREATION, COLOSTOMY (Abdomen) SMALL BOWEL RESECTION (Abdomen) COLECTOMY, LEFT (Abdomen) DEBRIDEMENT OF ABDOMINAL WALL (Abdomen) APPLICATION, WOUND VAC (Abdomen)     Patient location during evaluation: SICU Anesthesia Type: General Level of consciousness: sedated Pain management: pain level controlled Vital Signs Assessment: post-procedure vital signs reviewed and stable Respiratory status: patient remains intubated per anesthesia plan Cardiovascular status: stable Postop Assessment: no apparent nausea or vomiting Anesthetic complications: no  No notable events documented.  Last Vitals:  Vitals:   05/31/23 0730 05/31/23 1125  BP: 135/79 (!) 167/75  Pulse: 87 88  Resp: 16 20  Temp: (!) 36.4 C 36.6 C  SpO2: 97% 93%    Last Pain:  Vitals:   05/31/23 1204  TempSrc:   PainSc: 8                  Shelton Silvas

## 2023-05-31 NOTE — Progress Notes (Signed)
 PHARMACY - TOTAL PARENTERAL NUTRITION CONSULT NOTE   Indication:  chronic malnutrition chronic colocutaneous fistula/ subcutaneous abscess   Patient Measurements: Height: 6' (182.9 cm) Weight: (!) 149 kg (328 lb 7.8 oz) IBW/kg (Calculated) : 77.6 TPN AdjBW (KG): 95.7 Body mass index is 44.55 kg/m. Usual Weight: 152-155 kg (fluctuating over the past 6 months; 170 kg 04/2022) 2/26 standing weight 134 kg (confirmed with RN and patient) 3/01 standing wt =150.2kg per RN   Assessment:  63 yo male with with medical history significant for hypertension, type 2 diabetes mellitus, history of DVT/PE on Xarelto, PAF, HFpEF with a recent admission for LLQ intra-abdominal abscess, colocutaneous fistula, and abdominal wall abscess on 04/27/2023, had IR drain placed into the LLQ abscess and the intra-abdominal abscess aspirated on 1/31. He returned to the ED with concerns that the drain in his abdomen was not functioning and now is in need of surgery with a possible colostomy. Per chart review, this has been going on for 8 months resulting in about 30 lbs weight loss. Of note, bed weights are significantly higher than standing weights. Per surgery, allow patient to eat freely by mouth. Patient has been eating 100% of eggs, sausage, cereal, toast, orange juice and coffee for breakfast and spaghetti, [place] and gravy, green beans, creamed potatoes for lunch/dinner. As of 2/28, patient is constipated, last BM PTA. Pharmacy consulted to start TPN to optimize nutrition prior to surgery. Surgery planned for 3/5.   Note, patient is a hard stick so labs are being drawn from PICC line. Asked for standing weights as bed weights are inaccurate.  Glucose / Insulin: A1c 03/2023 6.5% metformin PTA; BG 204-233, 8 units SSI used/24 hrs, lantus 10 added 3/4 PM, sSSI with meals  Electrolytes: 3/3-Na 133, K 4.1, Mg 1.8, Phos 2.7, others wnl Renal:  Scr 1.12 (BL ~1.5), BUN 34 Hepatic: Alk phos/ALT/Tbili wnl, prealbumin up 15 >  22, albumin 2.4, TG 58 Intake / Output; MIVF: UOP 0.5 ml/kg/hr, LBM 3/04  GI Imaging:  2/25 CT: previously identified intraabdominal abscess decreased with drain, air-fluid collection in tissue concerning for abd wall abscess  GI Surgeries / Procedures:  none since TPN initiation  Central access: 05/25/23 PICC TPN start date: 05/25/23   Nutritional Goals: Goal Clinimix 8/10 at 36ml/hr = daily provides 160g AA and 1325 kcal/d  RD Assessment: discussed with RD 2/28, given significant PO intake, will meet 80- 100% protein needs and 50-60% of Kcal needs via Clinimix  Estimated Needs Total Energy Estimated Needs: 2400-2600 kcal Total Protein Estimated Needs: 160-180 gm Total Fluid Estimated Needs: >2L/day  Current Nutrition:  TPN  2/27-3/5 heart diet, eating 100% of meals  3/5 NPO for OR   Plan:  Will transition patient to compounded TPN given that NPO for procedure and will likely have reduced PO intake post-op. Clinimix with SMOF will only meet approximately 73% of kcal requirements.   Start at goal TPN rate of 125mL/hr, will provide 160g AA and 2400kcal meeting 100% of nutritional needs.  Electrolytes in TPN: Na 54mEq/L, K 63mEq/L, Ca 8mEq/L, Mg 47mEq/L, and Phos 30mmol/L. Max chloride, unable to do any other ratios with current formula, patient has been on 2L no electrolyte TPN since 2/28 and has not required outside of bag replacement, will trend prior to adding electrolytes within the TPN.  D/c MVI outside of bag, will give now with compounded TPN.  Discussed with MD will d/c lantus 10 and add 10 units within TPN bag, will also adjust SSI to  resistant Q4H.  Monitor TPN labs on Mon/Thurs, and PRN.  F/u toleration of diet post-op for ability to wean TPN.   Thank you for allowing pharmacy to be a part of this patient's care.  Estill Batten, PharmD, BCCCP Clinical Pharmacist

## 2023-05-31 NOTE — Progress Notes (Signed)
 Patient extubated to CPAP after SBT trial passed. Pt following commands pre and post extubation with a strong cough when instructed, cuff leak noted pre extubation. Pt currently on CPAP of 8 per order placed, pt tolerating CPAP well.

## 2023-05-31 NOTE — Op Note (Signed)
 Preoperative diagnosis chronic colocutaneous fistula thought to be related to a diverticular episode Postoperative diagnosis: Same as above, intra-abdominal abscess, abdominal wall abscess Procedure: 1.  Exploratory laparotomy with lysis of adhesions 2.  Left colectomy 3.  Small bowel resection with primary anastomosis 4.  Debridement of abdominal wall abscess 5.  End transverse colostomy 6.  Takedown of splenic flexure EGD Surgeon: Dr. Harden Mo Assistant: Leary Roca, PA-C Anesthesia: General Estimated blood loss: 100 cc Specimens: Left colon to pathology Small bowel to pathology with associated abscess cavity attached to it Complications: None Drains: 19 French Blake drain placed in the pelvis Sponge no count was correct completion Disposition recovery stable condition  Indications: This is a 63 year old male who has had a longstanding colocutaneous fistula.  He is morbidly obese.  This area has not healed over at least 6 months.  We discussed now that his nutrition is adequate just proceeding with resection.  Procedure: After informed consent was obtained he was taken to the operating room.  He was already on antibiotics.  He was given Cefotan prior to surgery.  He was also given subcutaneous heparin prior to surgery.  He had SCDs in place.  He was placed under general anesthesia without complication.  A Foley catheter was placed.  A nasogastric tube was placed but we will need to confirm placement with x-ray in the recovery room.  He was then prepped and draped in a standard sterile surgical surgical fashion.  I cut the tube that was draining the stool down towards the skin.  And we covered this area.  Surgical timeout was then performed.  It was very difficult to locate his midline due to his body habitus.  I did make a midline incision and carried this down to the umbilicus.  I used the umbilicus to find the midline and then incised the fascia.  I then entered the peritoneum  without difficulty.  I then had to lyse adhesions from his omentum to his abdominal wall as well as his left lower quadrant to begin.  I ran his entire small bowel.  Is a good portion of his small bowel was adherent to this abscess cavity and stuck to his abdominal wall.  I was able to identify the colon on either side of this.  I eventually just had to use a combination of scissors and blunt dissection and remove the small bowel from the abdominal wall.  Once I had done this I entered into a large abscess cavity that contained stool.  It was very clear where the colonic hole was.  Interestingly he did not have a lot of diverticuli present.  I was then able to rotate the colon medially by taking down the white line.  I went down to the distal sigmoid as I did not really want to go down any farther due to his habitus and divided his large intestine with a contour stapler.  I divided with a GIA just proximal to the hole in the colon.  This went as the first specimen is sigmoid colon stitch proximal.  I then placed a sponge there.  I then ran his entire small bowel.  He had a fairly decent sized area of small bowel that was in a not attached to really an abscess cavity that would not come off of it.  This to bowel did not look very healthy.  I made the decision to resected.  I used the GIA stapler divided on either end.  I then used the  LigaSure to divide the mesentery.  This was passed off the table as a specimen.  I then brought the small bowel into apposition with 3-0 silk sutures.  I fixed the mesenteric defect with silk sutures as well.  I then made enterotomies in both.  I used the GIA stapler to create a common anastomosis.  The common enterotomy was closed with a TX stapler.  I placed a suture in the staple line to stop an area that was bleeding with 3-0 silk suture.  This was patent and I the mesenteric defect was completely closed.  This appeared viable.  I then placed this back in the abdomen.  It became  very clear that there is no way his left colon was going to reach his abdominal wall.  I then proceeded with a combination of cautery and blunt dissection to mobilize the splenic flexure and his transverse colon.  I used the LigaSure as well as some ties to divide the mesentery and completed a left colectomy.  His transverse colon was then separated from his omentum and it was clear that this would come up to the abdominal wall in the upper abdomen.  I then irrigated the abdomen.  Placed a 58 [place] Blake drain in his pelvis and secured this with a 2-0 nylon suture.  I then went to the left side where he had had the colocutaneous fistula.  I remove the skin as well as the drain tract and the chronic fistula tract and remove this as well as the subcutaneous tissue down into another abscess cavity.  I drained all the purulence out of this.  We irrigated this.  This was later then packed with a Betadine soaked sponge will allow this to heal by secondary intention.  The abdomen had been marked for a colostomy.  I had actually made the site just a little bit below this as it was easier for the colon to reach.  I then removed a paddle of the skin as well as the core of subcutaneous tissue all the way down to his fascia.  A cruciate incision was made in his fascia and I then I split the muscle and entered into the peritoneum.  The transverse colon was delivered through this without difficulty and not under any tension.  This was viable.  We then closed the abdomen with #1 looped PDS suture.  I then matured the ostomy with 3-0 Vicryl suture.  A VAC sponge was placed on the incision.  Appliance was placed on the ostomy and dressings were placed.

## 2023-05-31 NOTE — Progress Notes (Signed)
 eLink Physician-Brief Progress Note Patient Name: Bradley Torres DOB: 08-30-1960 MRN: [id number]   Date of Service  05/31/2023  HPI/Events of Note  Unable to get Gastric tube in good location tonight.  eICU Interventions  Flagyl changed to IV Replace gastric tube in the am     Intervention Category Intermediate Interventions: OtherHenry Russel, Demetrius Charity 05/31/2023, 11:43 PM

## 2023-05-31 NOTE — Plan of Care (Signed)
  Problem: Education: Goal: Ability to describe self-care measures that may prevent or decrease complications (Diabetes Survival Skills Education) will improve Outcome: Progressing   Problem: Coping: Goal: Ability to adjust to condition or change in health will improve Outcome: Progressing   Problem: Fluid Volume: Goal: Ability to maintain a balanced intake and output will improve Outcome: Progressing   Problem: Metabolic: Goal: Ability to maintain appropriate glucose levels will improve Outcome: Progressing   Problem: Activity: Goal: Risk for activity intolerance will decrease Outcome: Progressing   Problem: Pain Managment: Goal: General experience of comfort will improve and/or be controlled Outcome: Progressing   Problem: Safety: Goal: Ability to remain free from injury will improve Outcome: Progressing

## 2023-06-01 ENCOUNTER — Encounter (HOSPITAL_COMMUNITY): Payer: Self-pay | Admitting: General Surgery

## 2023-06-01 ENCOUNTER — Ambulatory Visit: Payer: Medicaid Other | Admitting: Cardiology

## 2023-06-01 DIAGNOSIS — I480 Paroxysmal atrial fibrillation: Secondary | ICD-10-CM | POA: Diagnosis not present

## 2023-06-01 DIAGNOSIS — K632 Fistula of intestine: Secondary | ICD-10-CM

## 2023-06-01 DIAGNOSIS — G4733 Obstructive sleep apnea (adult) (pediatric): Secondary | ICD-10-CM | POA: Diagnosis not present

## 2023-06-01 DIAGNOSIS — E1165 Type 2 diabetes mellitus with hyperglycemia: Secondary | ICD-10-CM | POA: Diagnosis not present

## 2023-06-01 LAB — COMPREHENSIVE METABOLIC PANEL
ALT: 14 U/L (ref 0–44)
AST: 15 U/L (ref 15–41)
Albumin: 2.6 g/dL — ABNORMAL LOW (ref 3.5–5.0)
Alkaline Phosphatase: 32 U/L — ABNORMAL LOW (ref 38–126)
Anion gap: 5 (ref 5–15)
BUN: 41 mg/dL — ABNORMAL HIGH (ref 8–23)
CO2: 23 mmol/L (ref 22–32)
Calcium: 8.3 mg/dL — ABNORMAL LOW (ref 8.9–10.3)
Chloride: 102 mmol/L (ref 98–111)
Creatinine, Ser: 1.2 mg/dL (ref 0.61–1.24)
GFR, Estimated: >60 mL/min (ref >60)
Glucose, Bld: 257 mg/dL — ABNORMAL HIGH (ref 70–99)
Potassium: 4.3 mmol/L (ref 3.5–5.1)
Sodium: 130 mmol/L — ABNORMAL LOW (ref 135–145)
Total Bilirubin: 0.4 mg/dL (ref 0.0–1.2)
Total Protein: 5.8 g/dL — ABNORMAL LOW (ref 6.5–8.1)

## 2023-06-01 LAB — CBC
HCT: 27.2 % — ABNORMAL LOW (ref 39.0–52.0)
Hemoglobin: 9.1 g/dL — ABNORMAL LOW (ref 13.0–17.0)
MCH: 31.3 pg (ref 26.0–34.0)
MCHC: 33.5 g/dL (ref 30.0–36.0)
MCV: 93.5 fL (ref 80.0–100.0)
Platelets: 315 10*3/uL (ref 150–400)
RBC: 2.91 MIL/uL — ABNORMAL LOW (ref 4.22–5.81)
RDW: 15.9 % — ABNORMAL HIGH (ref 11.5–15.5)
WBC: 24.8 10*3/uL — ABNORMAL HIGH (ref 4.0–10.5)
nRBC: 0 % (ref 0.0–0.2)

## 2023-06-01 LAB — GLUCOSE, CAPILLARY
Glucose-Capillary: 177 mg/dL — ABNORMAL HIGH (ref 70–99)
Glucose-Capillary: 184 mg/dL — ABNORMAL HIGH (ref 70–99)
Glucose-Capillary: 209 mg/dL — ABNORMAL HIGH (ref 70–99)
Glucose-Capillary: 210 mg/dL — ABNORMAL HIGH (ref 70–99)
Glucose-Capillary: 231 mg/dL — ABNORMAL HIGH (ref 70–99)
Glucose-Capillary: 251 mg/dL — ABNORMAL HIGH (ref 70–99)
Glucose-Capillary: 259 mg/dL — ABNORMAL HIGH (ref 70–99)

## 2023-06-01 LAB — MAGNESIUM: Magnesium: 1.5 mg/dL — ABNORMAL LOW (ref 1.7–2.4)

## 2023-06-01 LAB — PHOSPHORUS: Phosphorus: 2.5 mg/dL (ref 2.5–4.6)

## 2023-06-01 MED ORDER — INSULIN ASPART 100 UNIT/ML IJ SOLN
0.0000 [IU] | INTRAMUSCULAR | Status: DC
  Administered 2023-06-01: 4 [IU] via SUBCUTANEOUS
  Administered 2023-06-01: 11 [IU] via SUBCUTANEOUS
  Administered 2023-06-01: 7 [IU] via SUBCUTANEOUS
  Administered 2023-06-02 (×3): 4 [IU] via SUBCUTANEOUS
  Administered 2023-06-02: 7 [IU] via SUBCUTANEOUS
  Administered 2023-06-02 – 2023-06-03 (×8): 4 [IU] via SUBCUTANEOUS
  Administered 2023-06-04 (×3): 3 [IU] via SUBCUTANEOUS
  Administered 2023-06-04: 7 [IU] via SUBCUTANEOUS
  Administered 2023-06-04 (×2): 4 [IU] via SUBCUTANEOUS
  Administered 2023-06-05 (×2): 3 [IU] via SUBCUTANEOUS

## 2023-06-01 MED ORDER — AMIODARONE HCL IN DEXTROSE 360-4.14 MG/200ML-% IV SOLN
30.0000 mg/h | INTRAVENOUS | Status: DC
  Administered 2023-06-01 – 2023-06-05 (×9): 30 mg/h via INTRAVENOUS
  Filled 2023-06-01 (×9): qty 200

## 2023-06-01 MED ORDER — METHOCARBAMOL 1000 MG/10ML IJ SOLN
1000.0000 mg | Freq: Three times a day (TID) | INTRAMUSCULAR | Status: DC
  Administered 2023-06-01 – 2023-06-03 (×7): 1000 mg via INTRAVENOUS
  Filled 2023-06-01 (×7): qty 10

## 2023-06-01 MED ORDER — INSULIN ASPART 100 UNIT/ML IJ SOLN
4.0000 [IU] | Freq: Once | INTRAMUSCULAR | Status: AC
  Administered 2023-06-01: 4 [IU] via SUBCUTANEOUS

## 2023-06-01 MED ORDER — INSULIN ASPART 100 UNIT/ML IJ SOLN
4.0000 [IU] | INTRAMUSCULAR | Status: AC
  Administered 2023-06-01 – 2023-06-02 (×8): 4 [IU] via SUBCUTANEOUS

## 2023-06-01 MED ORDER — METRONIDAZOLE 500 MG/100ML IV SOLN
500.0000 mg | Freq: Two times a day (BID) | INTRAVENOUS | Status: DC
  Administered 2023-06-01 – 2023-06-03 (×6): 500 mg via INTRAVENOUS
  Filled 2023-06-01 (×6): qty 100

## 2023-06-01 MED ORDER — MAGNESIUM SULFATE 2 GM/50ML IV SOLN
2.0000 g | Freq: Once | INTRAVENOUS | Status: AC
  Administered 2023-06-01: 2 g via INTRAVENOUS
  Filled 2023-06-01: qty 50

## 2023-06-01 MED ORDER — TRAVASOL 10 % IV SOLN
INTRAVENOUS | Status: AC
  Filled 2023-06-01: qty 1600.2

## 2023-06-01 NOTE — Evaluation (Signed)
 Physical Therapy Evaluation Patient Details Name: Bradley Torres MRN: [id number] DOB: [birth year] Today's Date: 06/01/2023  History of Present Illness  Pt is a 63 y.o. male admitted 2/25 with acute renal failure and chronic colocutaneous fistula. He underwent L colectomy and transverse colostomy 3/5. PMH: HTN, DMII, morbid obesity, OSA with CPAP intolerance, h/o DVT and PE on Xarelto, PAF, chronic HFpEF   Clinical Impression  Pt admitted with above diagnosis. PTA pt lived alone, independent. Pt currently with functional limitations due to the deficits listed below (see PT Problem List). On eval, pt required +2 mod assist bed mobility, +2 min assist sit to stand, and min assist +2 safety/lines sidestepping bedside with RW. Pt very fearful of pain with mobility. VSS on RA. Pt will benefit from acute skilled PT to increase their independence and safety with mobility to allow discharge. Upon d/c, pt would benefit from further therapy in inpatient setting < 3 hours/day.         If plan is discharge home, recommend the following: A lot of help with bathing/dressing/bathroom;A lot of help with walking and/or transfers;Help with stairs or ramp for entrance;Assistance with cooking/housework;Assist for transportation   Can travel by private vehicle   No    Equipment Recommendations None recommended by PT  Recommendations for Other Services       Functional Status Assessment Patient has had a recent decline in their functional status and demonstrates the ability to make significant improvements in function in a reasonable and predictable amount of time.     Precautions / Restrictions Precautions Precautions: Fall;Other (comment) Recall of Precautions/Restrictions: Intact Precaution/Restrictions Comments: jp drain, wound vac, foley, NG tube, bilat UE IVs      Mobility  Bed Mobility Overal bed mobility: Needs Assistance Bed Mobility: Rolling, Sidelying to Sit, Sit to Sidelying Rolling: Min  assist Sidelying to sit: +2 for physical assistance, Mod assist, HOB elevated, Used rails     Sit to sidelying: Mod assist, +2 for physical assistance, Used rails General bed mobility comments: increased time, cues for sequencing. Pt fearful of pain.    Transfers Overall transfer level: Needs assistance Equipment used: Rolling walker (2 wheels) Transfers: Sit to/from Stand Sit to Stand: +2 physical assistance, Min assist, From elevated surface           General transfer comment: increase pain. Pt fearful of pain.    Ambulation/Gait               General Gait Details: Pt able to take sidesteps bedside with RW min assist +2 safety/lines. Distance limited by pain. Pt bleeding from jp drain insertion site. RN aware.  Stairs            Wheelchair Mobility     Tilt Bed    Modified Rankin (Stroke Patients Only)       Balance Overall balance assessment: Needs assistance Sitting-balance support: Feet supported, Single extremity supported Sitting balance-Leahy Scale: Fair     Standing balance support: Bilateral upper extremity supported, During functional activity, Reliant on assistive device for balance Standing balance-Leahy Scale: Poor                               Pertinent Vitals/Pain Pain Assessment Pain Assessment: Faces Faces Pain Scale: Hurts even more Pain Location: abdomen with mobility Pain Descriptors / Indicators: Discomfort, Grimacing, Guarding, Sore Pain Intervention(s): Limited activity within patient's tolerance, Monitored during session, Repositioned, Premedicated before session    Home  Living Family/patient expects to be discharged to:: Private residence Living Arrangements: Alone Available Help at Discharge: Friend(s) Type of Home: Mobile home Home Access: Stairs to enter Entrance Stairs-Rails: Left Entrance Stairs-Number of Steps: 5   Home Layout: One level Home Equipment: Agricultural consultant (2 wheels);Cane -  quad;BSC/3in1;Shower seat;Wheelchair - manual Additional Comments: "I have everything."    Prior Function Prior Level of Function : Independent/Modified Independent                     Extremity/Trunk Assessment   Upper Extremity Assessment Upper Extremity Assessment: Defer to OT evaluation    Lower Extremity Assessment Lower Extremity Assessment: Generalized weakness    Cervical / Trunk Assessment Cervical / Trunk Assessment: Normal  Communication   Communication Communication: No apparent difficulties    Cognition Arousal: Alert Behavior During Therapy: WFL for tasks assessed/performed   PT - Cognitive impairments: No apparent impairments                         Following commands: Intact       Cueing Cueing Techniques: Verbal cues, Tactile cues     General Comments General comments (skin integrity, edema, etc.): VSS on RA    Exercises     Assessment/Plan    PT Assessment Patient needs continued PT services  PT Problem List Decreased strength;Decreased balance;Pain;Decreased mobility;Decreased activity tolerance       PT Treatment Interventions Functional mobility training;Balance training;Patient/family education;Gait training;Therapeutic activities;Stair training;Therapeutic exercise    PT Goals (Current goals can be found in the Care Plan section)  Acute Rehab PT Goals Patient Stated Goal: home, independence PT Goal Formulation: With patient Time For Goal Achievement: 06/15/23 Potential to Achieve Goals: Good    Frequency Min 3X/week     Co-evaluation               AM-PAC PT "6 Clicks" Mobility  Outcome Measure Help needed turning from your back to your side while in a flat bed without using bedrails?: A Lot Help needed moving from lying on your back to sitting on the side of a flat bed without using bedrails?: Total Help needed moving to and from a bed to a chair (including a wheelchair)?: A Lot Help needed standing up  from a chair using your arms (e.g., wheelchair or bedside chair)?: A Little Help needed to walk in hospital room?: Total Help needed climbing 3-5 steps with a railing? : Total 6 Click Score: 10    End of Session   Activity Tolerance: Patient tolerated treatment well Patient left: in bed;with call bell/phone within reach;with nursing/sitter in room Nurse Communication: Mobility status PT Visit Diagnosis: Other abnormalities of gait and mobility (R26.89);Pain    Time: 1610-9604 PT Time Calculation (min) (ACUTE ONLY): 35 min   Charges:   PT Evaluation $PT Eval Moderate Complexity: 1 Mod   PT General Charges $$ ACUTE PT VISIT: 1 Visit         Ferd Glassing., PT  Office # [phone]   Ilda Foil 06/01/2023, 1:27 PM

## 2023-06-01 NOTE — Progress Notes (Signed)
 1 Day Post-Op   Subjective/Chief Complaint: On cpap, responsive, off pressors   Objective: Vital signs in last 24 hours: Temp:  [97.8 F (36.6 C)-99.6 F (37.6 C)] 98.6 F (37 C) (03/06 0743) Pulse Rate:  [74-102] 97 (03/06 0700) Resp:  [13-30] 19 (03/06 0700) BP: (139-167)/(70-75) 139/70 (03/05 2100) SpO2:  [90 %-100 %] 94 % (03/06 0700) Arterial Line BP: (74-158)/(45-79) 126/77 (03/06 0700) FiO2 (%):  [21 %-40 %] 21 % (03/06 0800) Weight:  [149 kg] 149 kg (03/05 1125) Last BM Date :  (pta)  Intake/Output from previous day: 03/05 0701 - 03/06 0700 In: 6760.7 [I.V.:4346; IV Piggyback:2414.7] Out: 2285 [Urine:1975; Drains:160; Blood:150] Intake/Output this shift: Total I/O In: 214.1 [I.V.:214.1] Out: 295 [Urine:250; Drains:45]  Ab approp tender, vac in place, stoma viable  Lab Results:  Recent Labs    05/30/23 0303 05/31/23 1214 05/31/23 1751 06/01/23 0424  WBC 11.5*  --   --  24.8*  HGB 9.9*   < > 9.9* 9.1*  HCT 30.5*   < > 29.0* 27.2*  PLT 389  --   --  315   < > = values in this interval not displayed.   BMET Recent Labs    05/31/23 1414 05/31/23 1533 05/31/23 1751 06/01/23 0424  NA 132*   < > 134* 130*  K 4.7   < > 4.9 4.3  CL 103  --   --  102  CO2  --   --   --  23  GLUCOSE 246*  --   --  257*  BUN 38*  --   --  41*  CREATININE 1.10  --   --  1.20  CALCIUM  --   --   --  8.3*   < > = values in this interval not displayed.   PT/INR No results for input(s): "LABPROT", "INR" in the last 72 hours. ABG Recent Labs    05/31/23 1558 05/31/23 1751  PHART 7.357 7.401  HCO3 24.7 23.6    Studies/Results: DG Abd Portable 1V Result Date: 06/01/2023 CLINICAL DATA:  Check gastric catheter placement EXAM: PORTABLE ABDOMEN - 1 VIEW COMPARISON:  None Available. FINDINGS: Gastric catheter is noted coiled within the stomach. Postsurgical changes in the lumbar spine are noted. No free air is seen. IMPRESSION: Gastric catheter coiled within the stomach.  Electronically Signed   By: Alcide Clever M.D.   On: 06/01/2023 01:03   DG Abd Portable 1V Result Date: 05/31/2023 CLINICAL DATA:  Check gastric catheter placement EXAM: PORTABLE ABDOMEN - 1 VIEW COMPARISON:  None Available. FINDINGS: Gastric catheter is noted within the stomach. Proximal side port lies at the level of the gastroesophageal junction. This should be advanced deeper into the stomach. IMPRESSION: Gastric catheter as described. This should be advanced deeper into the stomach. Electronically Signed   By: Alcide Clever M.D.   On: 05/31/2023 22:33   Portable Chest x-ray Result Date: 05/31/2023 CLINICAL DATA:  Endotracheal tube present. EXAM: PORTABLE CHEST 1 VIEW COMPARISON:  Radiograph 04/10/2019 FINDINGS: Endotracheal tube tip 5.3 cm from the carina at the level of the clavicular heads. Right upper extremity PICC tip in the upper SVC. Enteric tube tip below the diaphragm not included in the field of view. Low lung volumes without focal airspace disease. Normal heart size for technique. No pleural effusion or pneumothorax. IMPRESSION: 1. Endotracheal tube tip 5.3 cm from the carina. 2. Right upper extremity PICC tip in the upper SVC. 3. Low lung volumes without acute chest findings.  Electronically Signed   By: Narda Rutherford M.D.   On: 05/31/2023 17:35   DG Abd Portable 1V Result Date: 05/31/2023 CLINICAL DATA:  Nasogastric tube placement. EXAM: PORTABLE ABDOMEN - 1 VIEW COMPARISON:  None Available. FINDINGS: Enteric tube tip below the diaphragm in the stomach, the side port is just beyond the gastroesophageal junction. No bowel dilatation in the upper abdomen. IMPRESSION: Tip and side port of the enteric tube below the diaphragm in the stomach. Electronically Signed   By: Narda Rutherford M.D.   On: 05/31/2023 17:34    Anti-infectives: Anti-infectives (From admission, onward)    Start     Dose/Rate Route Frequency Ordered Stop   06/01/23 1000  metroNIDAZOLE (FLAGYL) IVPB 500 mg  Status:   Discontinued        500 mg 100 mL/hr over 60 Minutes Intravenous Every 12 hours 05/31/23 2343 06/01/23 0103   06/01/23 0115  metroNIDAZOLE (FLAGYL) IVPB 500 mg        500 mg 100 mL/hr over 60 Minutes Intravenous Every 12 hours 06/01/23 0103     05/31/23 1140  sodium chloride 0.9 % with cefoTEtan (CEFOTAN) ADS Med       Note to Pharmacy: Clovis Cao M: cabinet override      05/31/23 1140 05/31/23 1350   05/31/23 1115  cefoTEtan (CEFOTAN) 2 g in sodium chloride 0.9 % 100 mL IVPB        2 g 200 mL/hr over 30 Minutes Intravenous On call to O.R. 05/31/23 1020 05/31/23 1400   05/27/23 1000  metroNIDAZOLE (FLAGYL) tablet 500 mg  Status:  Discontinued        500 mg Oral Every 12 hours 05/27/23 0722 05/31/23 2343   05/23/23 2315  cefTRIAXone (ROCEPHIN) 2 g in sodium chloride 0.9 % 100 mL IVPB        2 g 200 mL/hr over 30 Minutes Intravenous Every 24 hours 05/23/23 2305     05/23/23 2315  metroNIDAZOLE (FLAGYL) IVPB 500 mg  Status:  Discontinued        500 mg 100 mL/hr over 60 Minutes Intravenous Every 12 hours 05/23/23 2305 05/27/23 3244       Assessment/Plan: POD 1 elap, left colectomy, transverse colostomy, debridement ab wall -extubated, continue ngt today -woc consult for new stoma -start dressing changes today -can tx out of unit -PT/OT -labs as expected this am -needs abx for five days postop -no anticoag today except for prophylactic   Bradley Torres 06/01/2023

## 2023-06-01 NOTE — Consult Note (Signed)
 WOC Nurse ostomy consult note Stoma type/location: LMQ transverse colostomy.  Slightly lower than marking, but appears to be free of surrounding creasing or troublesome abdominal topography for pouching.  Stomal assessment/size: 1 3/8" assessed through pouch, appears to be budded, pink and moist  Peristomal assessment: LLQ drain site is open and draining serosanguinous effluent.  SOme effluent on pouch barrier but seal is intact.   Midline abdominal incision with NPWT (VAC) dressing in place.  WIll change pouch and midline incision dressing Friday.  As both overlap Treatment options for stomal/peristomal skin: barrier ring, and 2 piece pouch  Output blood tinged liquid Ostomy pouching: 2pc. Anticipate adding barrier ring to promote seal due to close proximity of former drain site.   Education provided: patient opens his eyes when I enter room. States he is tired and hurting.  I inform him that we will change ostomy pouch and abdominal dressing tomorrow.   Enrolled patient in [employer] DC program: NO Will follow.  Will order supplies for ostomy and VAC care for Friday.  Mike Gip MSN, RN, [name]-BC CWON Wound, Ostomy, Continence Nurse Outpatient [facility] [phone] Pager [phone]

## 2023-06-01 NOTE — Progress Notes (Signed)
 eLink Physician-Brief Progress Note Patient Name: Bradley Torres DOB: [birth year] MRN: [id number]   Date of Service  06/01/2023  HPI/Events of Note  Mg 1.5  eICU Interventions  replaced     Intervention Category Intermediate Interventions: Electrolyte abnormality - evaluation and management  Henry Russel, P 06/01/2023, 6:01 AM

## 2023-06-01 NOTE — Progress Notes (Signed)
 PHARMACY - TOTAL PARENTERAL NUTRITION CONSULT NOTE   Indication:  chronic malnutrition chronic colocutaneous fistula/ subcutaneous abscess   Patient Measurements: Height: 6' (182.9 cm) Weight: (!) 149 kg (328 lb 7.8 oz) IBW/kg (Calculated) : 77.6 TPN AdjBW (KG): 95.5 Body mass index is 44.55 kg/m. Usual Weight: 152-155 kg (fluctuating over the past 6 months; 170 kg 04/2022) 2/26 standing weight 134 kg (confirmed with RN and patient) 3/01 standing wt =150.2kg per RN   Assessment:  63 yo male with with medical history significant for hypertension, type 2 diabetes mellitus, history of DVT/PE on Xarelto, PAF, HFpEF with a recent admission for LLQ intra-abdominal abscess, colocutaneous fistula, and abdominal wall abscess on 04/27/2023, had IR drain placed into the LLQ abscess and the intra-abdominal abscess aspirated on 1/31. He returned to the ED with concerns that the drain in his abdomen was not functioning and now is in need of surgery with a possible colostomy. Per chart review, this has been going on for 8 months resulting in about 30 lbs weight loss. Of note, bed weights are significantly higher than standing weights. Per surgery, allow patient to eat freely by mouth. Patient has been eating 100% of eggs, sausage, cereal, toast, orange juice and coffee for breakfast and spaghetti, [place] and gravy, green beans, creamed potatoes for lunch/dinner. Pharmacy consulted to start TPN to optimize nutrition prior to surgery.   Transitioned patient to compounded TPN 3/05 given that NPO for procedure and will likely have reduced PO intake post-op. Clinimix (no electrolytes) only met approximately 73% of kcal requirements. Electrolytes now trending down after change to NPO. Will add electrolytes into TPN conservatively and adjust as needed.  Glucose / Insulin: A1c 03/2023 6.5%; BG ~220-260s, 10 units glargine x1 + 45 units SSI used/24 hrs Electrolytes: Na 130, K 4.3, Mg 1.5, Phos 2.5, others wnl Renal:   Scr 1.2 (BL ~1.5), BUN 41 Hepatic: Alk phos/ALT/Tbili wnl, prealbumin up 15 > 22, albumin 2.6, TG 58 (3/03) Intake / Output; MIVF: UOP down 0.6 ml/kg/hr, drain . LBM charted 3/04  GI Imaging:  2/25 CT: previously identified intraabdominal abscess decreased with drain, air-fluid collection in tissue concerning for abd wall abscess  GI Surgeries / Procedures:  3/05 expiratory laparotomy with LOA, left colectomy, small bowel resection   Central access: 05/25/23 PICC TPN start date: 05/25/23   Nutritional Goals: Goal Clinimix 8/10 at 9ml/hr = daily provides 160g AA and 1325 kcal/d  RD Assessment: discussed with RD 2/28, given significant PO intake, will meet 80- 100% protein needs and 50-60% of Kcal needs via Clinimix   Estimated Needs Total Energy Estimated Needs: 2400-2600 kcal Total Protein Estimated Needs: 160-180 gm Total Fluid Estimated Needs: >2L/day  Current Nutrition:  TPN  2/27 FLD- ate 100% 2/28 heart diet - eating 100% of meals  3/05 NPO for surgery  Plan:  Continue compounded TPN as patient remains NPO after procedure, goal TPN rate of 169mL/hr, will provide 160g AA and 2400kcal meeting 100% of nutritional needs.  Electrolytes in TPN: Na 25 mEq/L, K 25 mEq/L, Ca 5 mEq/L, Mg 5 mEq/L, Phos 10 mmol/L. Max chloride, unable to do any other ratios with current formula, patient has been on 2L no electrolyte TPN since 2/28 and has not required outside of bag replacement, will trend prior to adding electrolytes within the TPN.  Give MVI with compounded TPN Increase to 20 units within TPN bag, will also adjust insulin aspart (outside of TPN) to 4 units + resistant SSI Q4H.  Monitor TPN  labs on Mon/Thurs, and PRN.  F/u toleration of diet post-op for ability to wean TPN.   Thank you for allowing pharmacy to be a part of this patient's care.  Thelma Barge, PharmD, BCPS Clinical Pharmacist

## 2023-06-01 NOTE — Progress Notes (Signed)
 Nutrition Follow-up  DOCUMENTATION CODES:   Morbid obesity, Non-severe (moderate) malnutrition in context of chronic illness  INTERVENTION:  TPN to meet nutrition needs given NPO status following GI surgery Monitor for diet advancement Will add nutrition supplements and provide new colostomy diet education when appropriate  NUTRITION DIAGNOSIS:   Moderate Malnutrition related to chronic illness as evidenced by mild fat depletion, mild muscle depletion, energy intake < or equal to 75% for > or equal to 1 month. - remains applicable  GOAL:   Patient will meet greater than or equal to 90% of their needs - goal   MONITOR:   PO intake, Supplement acceptance, Labs, Other (Comment) (TPN tolerance)  REASON FOR ASSESSMENT:   Consult New TPN/TNA  ASSESSMENT:   63 y.o male with chronically draining colocutaneous fisutla since diverticular abscess in 2024. Recently admitted 1/30-2/7 with abdominal pain found to have LLQ intra-abdominal abscess, feculent drainage, colocutaneous fistula. Discharged with drain and antibitoics with plan to optimize nutrition before surgery for management of fistula. Now presents with generalized weakness, malaise, loss of appetite. PMH of HTN, Morbid obesity, DVT, diverticulitis, CAD, T2DM, prior stroke (2019), PAF, chronic HFpEF, CKD 3.  2/25 - Heart healthy diet  2/28 - TPN started to optimize nutritional status before surgery 3/5 - s/p exlap with lysis of adhesions, L colectomy, small bowel resection with primary anastomosis, debridement of abdominal wall abscess, end transverse colostomy, takedown of splenic flexure  Pt was eating well prior to surgical procedure. Also had been receiving supplemental nutrition support via TPN to optimize nutritional status.   Spoke with NP regarding nutrition plan.  Continuing with TPN for now.  Surgery plans for continued bowel rest. Continue NGT today.  Will continue to follow for diet advancement and nutrition  needs. Will provide new colostomy education when more appropriate.   Drains/lines: 19 Fr blake drain in pelvis - x24 hours UOP: x24 hours Abdominal wound VAC - 0ml output x24 hours  Medications: SSI 0-20 units q4h, SSI 4 units q4h  Labs:  Sodium 130 BUN 41 Mg 1.5 Alkaline phos 32 Albumin 2.6 CBG's 231-287 x24 hours  Diet Order:   Diet Order             Diet NPO time specified  Diet effective midnight                   EDUCATION NEEDS:   Education needs have been addressed  Skin:  Skin Assessment: Skin Integrity Issues: Skin Integrity Issues:: Incisions Wound Vac: mid upper abdomen Incisions: Abdomen  Last BM:  3/4 type 1 small  Height:   Ht Readings from Last 1 Encounters:  05/31/23 6' (1.829 m)    Weight:   Wt Readings from Last 1 Encounters:  06/01/23 (!) 149.8 kg    Ideal Body Weight:  80.9 kg  BMI:  Body mass index is 44.79 kg/m.  Estimated Nutritional Needs:   Kcal:  2400-2600 kcal  Protein:  160-180 gm  Fluid:  >2L/day  Drusilla Kanner, RDN, LDN Clinical Nutrition

## 2023-06-01 NOTE — Progress Notes (Addendum)
 At 0930 NG pulled back 5 cm and placed to [facility]

## 2023-06-01 NOTE — Evaluation (Signed)
 Occupational Therapy Evaluation Patient Details Name: Bradley Torres MRN: [id number] DOB: 18-Jan-1961 Today's Date: 06/01/2023   History of Present Illness   Pt is a 63 y.o. male admitted 2/25 with acute renal failure and chronic colocutaneous fistula. He underwent L colectomy and transverse colostomy 3/5. PMH: HTN, DMII, morbid obesity, OSA with CPAP intolerance, h/o DVT and PE on Xarelto, PAF, chronic HFpEF     Clinical Impressions Pt was independent and lives alone prior to admission. Presents with anxiety related to abdominal pain with mobility, impaired cognition, decreased activity tolerance, generalized weakness and impaired sitting and standing balance. Pt requires +2 mod to max assist for bed mobility, +2 min to stand and side step along EOB with RW. He needs max to total assist for ADLs. Patient will benefit from continued inpatient follow up therapy, <3 hours/day.      If plan is discharge home, recommend the following:   Two people to help with walking and/or transfers;Two people to help with bathing/dressing/bathroom;Assistance with cooking/housework;Assist for transportation;Help with stairs or ramp for entrance     Functional Status Assessment   Patient has had a recent decline in their functional status and demonstrates the ability to make significant improvements in function in a reasonable and predictable amount of time.     Equipment Recommendations   None recommended by OT     Recommendations for Other Services         Precautions/Restrictions   Precautions Precautions: Fall;Other (comment) (abdominal) Precaution/Restrictions Comments: abdominal wound vac, R JP drain Restrictions Weight Bearing Restrictions Per Provider Order: No     Mobility Bed Mobility Overal bed mobility: Needs Assistance Bed Mobility: Rolling, Sidelying to Sit, Sit to Sidelying Rolling: +2 for physical assistance, Max assist Sidelying to sit: +2 for physical assistance, Mod  assist     Sit to sidelying: +2 for physical assistance, Max assist General bed mobility comments: multimodal cues for log roll technique, increased time    Transfers Overall transfer level: Needs assistance Equipment used: Rolling walker (2 wheels) Transfers: Sit to/from Stand Sit to Stand: +2 physical assistance, Min assist                  Balance Overall balance assessment: Needs assistance   Sitting balance-Leahy Scale: Fair Sitting balance - Comments: holding on to foot board and rail   Standing balance support: Bilateral upper extremity supported Standing balance-Leahy Scale: Poor                             ADL either performed or assessed with clinical judgement   ADL Overall ADL's : Needs assistance/impaired Eating/Feeding: NPO   Grooming: Wash/dry face;Bed level;Total assistance   Upper Body Bathing: Total assistance;Sitting   Lower Body Bathing: Total assistance;+2 for physical assistance;Sit to/from stand   Upper Body Dressing : Maximal assistance;Sitting   Lower Body Dressing: Total assistance;+2 for physical assistance;Sit to/from stand               Functional mobility during ADLs: +2 for physical assistance;Minimal assistance;+2 for safety/equipment;Rolling walker (2 wheels) General ADL Comments: Educated in use of pillow to splint abdomen, reports any pressure to abdomen increases his pain.     Vision Ability to See in Adequate Light: 0 Adequate Patient Visual Report: No change from baseline       Perception         Praxis         Pertinent Vitals/Pain Pain Assessment  Pain Assessment: Faces Faces Pain Scale: Hurts even more Pain Location: abdomen Pain Descriptors / Indicators: Grimacing, Guarding, Discomfort Pain Intervention(s): Premedicated before session, Repositioned, Monitored during session     Extremity/Trunk Assessment Upper Extremity Assessment Upper Extremity Assessment: Defer to OT evaluation    Lower Extremity Assessment Lower Extremity Assessment: Generalized weakness   Cervical / Trunk Assessment Cervical / Trunk Assessment: Normal   Communication Communication Communication: No apparent difficulties   Cognition Arousal: Alert Behavior During Therapy: Flat affect, Anxious Cognition: Cognition impaired     Awareness: Intellectual awareness intact Memory impairment (select all impairments): Short-term memory, Working Civil Service fast streamer, Engineer, structural memory Attention impairment (select first level of impairment): Sustained attention Executive functioning impairment (select all impairments): Initiation, Sequencing OT - Cognition Comments: pt with some difficulty offering information regarding home set up, highly focused on fear of pain                 Following commands: Impaired Following commands impaired: Follows one step commands with increased time     Cueing  General Comments   Cueing Techniques: Verbal cues;Tactile cues  VSS on RA   Exercises     Shoulder Instructions      Home Living Family/patient expects to be discharged to:: Private residence Living Arrangements: Alone Available Help at Discharge: Friend(s) Type of Home: Mobile home Home Access: Stairs to enter Entrance Stairs-Number of Steps: 5 Entrance Stairs-Rails: Left Home Layout: One level     Bathroom Shower/Tub: Chief Strategy Officer: Standard     Home Equipment: Agricultural consultant (2 wheels);Cane - quad;BSC/3in1;Shower seat;Wheelchair - manual   Additional Comments: "I have everything."      Prior Functioning/Environment Prior Level of Function : Independent/Modified Independent                    OT Problem List: Decreased strength;Decreased activity tolerance;Impaired balance (sitting and/or standing);Decreased cognition;Decreased safety awareness;Decreased knowledge of precautions;Pain   OT Treatment/Interventions: Self-care/ADL training;DME and/or AE  instruction;Therapeutic activities;Patient/family education;Balance training;Cognitive remediation/compensation      OT Goals(Current goals can be found in the care plan section)   Acute Rehab OT Goals OT Goal Formulation: With patient Time For Goal Achievement: 06/15/23 Potential to Achieve Goals: Good ADL Goals Pt Will Perform Grooming: with contact guard assist;standing Pt Will Perform Upper Body Dressing: with min assist;sitting Pt Will Perform Lower Body Dressing: with mod assist;with adaptive equipment;sit to/from stand Pt Will Transfer to Toilet: with contact guard assist;ambulating;bedside commode Additional ADL Goal #1: Pt will complete bed mobility using log roll technique with moderate assistance in preparation for ADLs.   OT Frequency:  Min 2X/week    Co-evaluation PT/OT/SLP Co-Evaluation/Treatment: Yes Reason for Co-Treatment: Complexity of the patient's impairments (multi-system involvement);For patient/therapist safety          AM-PAC OT "6 Clicks" Daily Activity     Outcome Measure Help from another person eating meals?: Total Help from another person taking care of personal grooming?: A Lot Help from another person toileting, which includes using toliet, bedpan, or urinal?: Total Help from another person bathing (including washing, rinsing, drying)?: Total Help from another person to put on and taking off regular upper body clothing?: A Lot Help from another person to put on and taking off regular lower body clothing?: Total 6 Click Score: 8   End of Session Equipment Utilized During Treatment: Rolling walker (2 wheels) Nurse Communication: Mobility status;Other (comment) (JP drain leaking)  Activity Tolerance: Patient limited by pain Patient left: in bed;with  call bell/phone within reach  OT Visit Diagnosis: Unsteadiness on feet (R26.81);Other abnormalities of gait and mobility (R26.89);Muscle weakness (generalized) (M62.81);Pain;Other symptoms and signs  involving cognitive function                Time: 1610-9604 OT Time Calculation (min): 31 min Charges:  OT General Charges $OT Visit: 1 Visit OT Evaluation $OT Eval Moderate Complexity: 1 Mod Berna Spare, OTR/L Acute Rehabilitation Services Office: [phone]  Evern Bio 06/01/2023, 1:29 PM

## 2023-06-01 NOTE — Consult Note (Signed)
 NAME:  Bradley Torres, MRN:  [id number], DOB:  14-Mar-1961, LOS: 9 ADMISSION DATE:  05/23/2023, CONSULTATION DATE:  05/31/23  REFERRING MD:  Dwain Sarna, CHIEF COMPLAINT:  endotracheally intubated    History of Present Illness:  63 yo M PMH morbid obesity, HTN, OSA, PE on xarelto, Afib, HFpEF recent admission for LLQ intraabd abscess and colocutaneous fistula was admitted to [facility] 05/23/23 for generalized weakness and concerns for malfunctioning IR drain; management of abdominal wall abscess + AKI on CKD.   IR exchanged drain for LLQ abscess was upsized 2/25 prior to admission. CCS was consulted 2/26 and rec abx, eval of nutr status, medical management/optimization. He required TPN during this time. Ultimately a decision was reached for OR w CCS 3/5, for ex lap LOA et al    He remained intubated after the case, PCCM is consulted in this setting   Pertinent  Medical History  Obesity Colocutaneous fistua Intraabdominal abscess  OSA  Significant Hospital Events: Including procedures, antibiotic start and stop dates in addition to other pertinent events   2/25 admit [facility] 3/5 OR, ICU post op. Extubated  3/6 off pressors txf out of ICU   Interim History / Subjective:  POD 1   Objective   Blood pressure 139/70, pulse 95, temperature 99.6 F (37.6 C), temperature source Axillary, resp. rate 17, height 6' (1.829 m), weight (!) 149 kg, SpO2 94%.    Vent Mode: PRVC FiO2 (%):  [40 %] 40 % Set Rate:  [16 bmp] 16 bmp Vt Set:  [620 mL] 620 mL PEEP:  [5 cmH20-8 cmH20] 8 cmH20 Plateau Pressure:  [17 cmH20] 17 cmH20   Intake/Output Summary (Last 24 hours) at 06/01/2023 0730 Last data filed at 06/01/2023 0500 Gross per 24 hour  Intake 6330.73 ml  Output 2285 ml  Net 4045.73 ml   Filed Weights   05/30/23 0500 05/31/23 0501 05/31/23 1125  Weight: (!) 149.3 kg (!) 149 kg (!) 149 kg    Examination: General: obese chronically ill M morbidly obese HENT: NCAT CPAP in place  Lungs: Diminished bases.   Cardiovascular: rrr s1s2 cap refill brisk  Abdomen: Obese soft abdomen. Midline vac. JP. L sided wet to dry  Extremities: no acute joint deformity  Neuro: drowsy, awakens and follows commands  GU: foley   Resolved Hospital Problem list    Endotracheally intubated  Hypotension   Assessment & Plan:   Hx OSA  P -cont CPAP -would benefit from home CPAP   Chronic colocutaneous fistula Intraabdominal abscess -3/5-- s/p  Ex LAP + LOA, L colectomy, small bowel resection with primary anastomosis, abd wall abscess debridement, end transverse colostomy, takedown of splenic fixture  P -post op per CCS  -NPO -- meds have been changed to IV  -cont rocephin and flagyl   Hx CKD -follow renal indcies UOP  DM2 w hyperglycemia -d/w pharmD -- correction via TPN + SSI   pAF HFpEF  P -amio -cont to hold hep gtt. Re-eval 3/7 -- d/w CCS   PE DVT  -hep gtt on hold as above   Dispo: Stable to transfer out of ICU, dw [facility]  Order placed to dc arterial line Order placed for progressive Will ask [facility] to take back over 3/7   Best Practice (right click and "Reselect all SmartList Selections" daily)   Diet/type: TPN DVT prophylaxis SCD Pressure ulcer(s): pressure ulcer assessment deferred  GI prophylaxis: PPI Lines: Central line Foley:  Yes, and it is still needed Code Status:  full code Last  date of multidisciplinary goals of care discussion [--]  Labs   CBC: Recent Labs  Lab 05/26/23 0306 05/27/23 0423 05/28/23 0318 05/30/23 0303 05/31/23 1214 05/31/23 1414 05/31/23 1533 05/31/23 1558 05/31/23 1751 06/01/23 0424  WBC 7.6 7.4 12.1* 11.5*  --   --   --   --   --  24.8*  HGB 10.0* 9.9* 10.2* 9.9*   < > 10.2* 9.5* 9.5* 9.9* 9.1*  HCT 31.2* 30.6* 32.0* 30.5*   < > 30.0* 28.0* 28.0* 29.0* 27.2*  MCV 96.3 96.2 97.0 95.9  --   --   --   --   --  93.5  PLT 387 380 411* 389  --   --   --   --   --  315   < > = values in this interval not displayed.    Basic Metabolic  Panel: Recent Labs  Lab 05/26/23 0306 05/27/23 0423 05/28/23 0318 05/29/23 0338 05/31/23 1214 05/31/23 1239 05/31/23 1414 05/31/23 1533 05/31/23 1558 05/31/23 1751 06/01/23 0424  NA 134* 134* 134* 133* 131* 133* 132* 133* 136 134* 130*  K 3.8 4.2 4.3 4.1 6.0* 4.6 4.7 5.1 4.8 4.9 4.3  CL 96* 98 98 99 106 104 103  --   --   --  102  CO2 26 27 25 26   --   --   --   --   --   --  23  GLUCOSE 175* 145* 178* 161* 259* 259* 246*  --   --   --  257*  BUN 29* 31* 35* 34* 58* 36* 38*  --   --   --  41*  CREATININE 1.47* 1.27* 1.23 1.12 1.30* 1.10 1.10  --   --   --  1.20  CALCIUM 8.9 9.0 9.1 9.3  --   --   --   --   --   --  8.3*  MG 1.8 2.0  --  1.8  --   --   --   --   --   --  1.5*  PHOS 3.7 3.0  --  2.7  --   --   --   --   --   --  2.5   GFR: Estimated Creatinine Clearance: 97.1 mL/min (by C-G formula based on SCr of 1.2 mg/dL). Recent Labs  Lab 05/27/23 0423 05/28/23 0318 05/30/23 0303 06/01/23 0424  WBC 7.4 12.1* 11.5* 24.8*    Liver Function Tests: Recent Labs  Lab 05/26/23 0306 05/29/23 0338 06/01/23 0424  AST 14* 18 15  ALT 11 15 14   ALKPHOS 50 43 32*  BILITOT 0.3 0.2 0.4  PROT 6.1* 6.2* 5.8*  ALBUMIN 2.3* 2.4* 2.6*   No results for input(s): "LIPASE", "AMYLASE" in the last 168 hours. No results for input(s): "AMMONIA" in the last 168 hours.  ABG    Component Value Date/Time   PHART 7.401 05/31/2023 1751   PCO2ART 37.8 05/31/2023 1751   PO2ART 118 (H) 05/31/2023 1751   HCO3 23.6 05/31/2023 1751   TCO2 25 05/31/2023 1751   ACIDBASEDEF 1.0 05/31/2023 1751   O2SAT 99 05/31/2023 1751     Coagulation Profile: No results for input(s): "INR", "PROTIME" in the last 168 hours.  Cardiac Enzymes: No results for input(s): "CKTOTAL", "CKMB", "CKMBINDEX", "TROPONINI" in the last 168 hours.  HbA1C: Hemoglobin A1C  Date/Time Value Ref Range Status  04/29/2021 12:00 AM 9.0  Final   HB A1C (BAYER DCA - WAIVED)  Date/Time Value Ref  Range Status  02/21/2023  03:42 PM 6.1 (H) 4.8 - 5.6 % Final    Comment:             Prediabetes: 5.7 - 6.4          Diabetes: >6.4          Glycemic control for adults with diabetes: <7.0   05/11/2022 01:41 PM 8.3 (H) 4.8 - 5.6 % Final    Comment:             Prediabetes: 5.7 - 6.4          Diabetes: >6.4          Glycemic control for adults with diabetes: <7.0    Hgb A1c MFr Bld  Date/Time Value Ref Range Status  04/28/2023 01:02 AM 6.5 (H) 4.8 - 5.6 % Final    Comment:    (NOTE) Pre diabetes:          5.7%-6.4%  Diabetes:              >6.4%  Glycemic control for   <7.0% adults with diabetes   06/19/2022 06:30 AM 8.3 (H) 4.8 - 5.6 % Final    Comment:    (NOTE)         Prediabetes: 5.7 - 6.4         Diabetes: >6.4         Glycemic control for adults with diabetes: <7.0     CBG: Recent Labs  Lab 05/31/23 1127 05/31/23 1649 05/31/23 1948 05/31/23 2350 06/01/23 0410  GLUCAP 261* 231* 228* 287* 259*    CCT na  Tessie Fass MSN, AGACNP-BC [facility] Amion for pager 06/01/2023, 7:31 AM

## 2023-06-02 DIAGNOSIS — K632 Fistula of intestine: Secondary | ICD-10-CM | POA: Diagnosis not present

## 2023-06-02 LAB — GLUCOSE, CAPILLARY
Glucose-Capillary: 190 mg/dL — ABNORMAL HIGH (ref 70–99)
Glucose-Capillary: 174 mg/dL — ABNORMAL HIGH (ref 70–99)
Glucose-Capillary: 203 mg/dL — ABNORMAL HIGH (ref 70–99)
Glucose-Capillary: 199 mg/dL — ABNORMAL HIGH (ref 70–99)
Glucose-Capillary: 187 mg/dL — ABNORMAL HIGH (ref 70–99)
Glucose-Capillary: 176 mg/dL — ABNORMAL HIGH (ref 70–99)

## 2023-06-02 LAB — BASIC METABOLIC PANEL
Anion gap: 6 (ref 5–15)
BUN: 30 mg/dL — ABNORMAL HIGH (ref 8–23)
CO2: 24 mmol/L (ref 22–32)
Calcium: 8.6 mg/dL — ABNORMAL LOW (ref 8.9–10.3)
Chloride: 103 mmol/L (ref 98–111)
Creatinine, Ser: 1.04 mg/dL (ref 0.61–1.24)
GFR, Estimated: >60 mL/min (ref >60)
Glucose, Bld: 195 mg/dL — ABNORMAL HIGH (ref 70–99)
Potassium: 4.1 mmol/L (ref 3.5–5.1)
Sodium: 133 mmol/L — ABNORMAL LOW (ref 135–145)

## 2023-06-02 LAB — CBC
HCT: 25.2 % — ABNORMAL LOW (ref 39.0–52.0)
HCT: 25.2 % — ABNORMAL LOW (ref 39.0–52.0)
Hemoglobin: 8.2 g/dL — ABNORMAL LOW (ref 13.0–17.0)
Hemoglobin: 8.2 g/dL — ABNORMAL LOW (ref 13.0–17.0)
MCH: 31.1 pg (ref 26.0–34.0)
MCH: 31.4 pg (ref 26.0–34.0)
MCHC: 32.5 g/dL (ref 30.0–36.0)
MCHC: 32.5 g/dL (ref 30.0–36.0)
MCV: 95.5 fL (ref 80.0–100.0)
MCV: 96.6 fL (ref 80.0–100.0)
Platelets: 271 10*3/uL (ref 150–400)
Platelets: 264 10*3/uL (ref 150–400)
RBC: 2.64 MIL/uL — ABNORMAL LOW (ref 4.22–5.81)
RBC: 2.61 MIL/uL — ABNORMAL LOW (ref 4.22–5.81)
RDW: 16.2 % — ABNORMAL HIGH (ref 11.5–15.5)
RDW: 16.5 % — ABNORMAL HIGH (ref 11.5–15.5)
WBC: 22.3 10*3/uL — ABNORMAL HIGH (ref 4.0–10.5)
WBC: 21.1 10*3/uL — ABNORMAL HIGH (ref 4.0–10.5)
nRBC: 0 % (ref 0.0–0.2)
nRBC: 0 % (ref 0.0–0.2)

## 2023-06-02 LAB — MAGNESIUM: Magnesium: 1.8 mg/dL (ref 1.7–2.4)

## 2023-06-02 LAB — PHOSPHORUS: Phosphorus: 3 mg/dL (ref 2.5–4.6)

## 2023-06-02 MED ORDER — TRAVASOL 10 % IV SOLN
INTRAVENOUS | Status: AC
  Filled 2023-06-02: qty 1600.2

## 2023-06-02 MED ORDER — HEPARIN (PORCINE) 25000 UT/250ML-% IV SOLN
2750.0000 [IU]/h | INTRAVENOUS | Status: DC
  Administered 2023-06-02: 2750 [IU]/h via INTRAVENOUS
  Filled 2023-06-02 (×2): qty 250

## 2023-06-02 MED ORDER — MAGNESIUM SULFATE 2 GM/50ML IV SOLN
2.0000 g | Freq: Once | INTRAVENOUS | Status: AC
  Administered 2023-06-02: 2 g via INTRAVENOUS
  Filled 2023-06-02: qty 50

## 2023-06-02 MED ORDER — ACETAMINOPHEN 10 MG/ML IV SOLN
1000.0000 mg | Freq: Four times a day (QID) | INTRAVENOUS | Status: AC
  Administered 2023-06-02 – 2023-06-03 (×4): 1000 mg via INTRAVENOUS
  Filled 2023-06-02 (×4): qty 100

## 2023-06-02 NOTE — Progress Notes (Addendum)
 Foley removed at 1800 per order.   Pt education given and pt verb understanding.

## 2023-06-02 NOTE — Progress Notes (Signed)
 PROGRESS NOTE    Bradley Torres  FTD:[id number] DOB: 06/13/1960 DOA: 05/23/2023 PCP: Mechele Claude, MD     Brief Narrative:  63 year old male, with past medical history significant for morbid obesity, hypertension, type 2 diabetes mellitus, OSA with CPAP intolerance, history of DVT and PE on Xarelto, PAF, chronic HFpEF, and recent admission with LLQ intra-abdominal abscess, colocutaneous fistula, and abdominal wall abscess.  Patient presented to hospital with generalized weakness and malaise.  Of note patient had been admitted to the hospital 04/10/2023 and IR had a drain placed in the left lower quadrant abscess.  Patient was seen by ID and was discharged on Bactrim and Augmentin.  Patient developed malaise weakness lack of appetite and lightheadedness 3 to 4 days prior to admission.  On presentation to ED, patient was afebrile.  Labs revealed serum creatinine elevation at 2.9, and albumin of 2.6, and no leukocytosis.  Lactate was elevated at 3.1.  CT demonstrated percutaneous pigtail catheter in the left anterior abdominal wall with interval resolution/decrease in abscess at the catheter tip, but with persistent air-fluid collection along the catheter tract concerning for abdominal wall abscess measuring up to 6.1 cm.  Blood cultures were drawn and patient received 1 L of IV fluids.  General surgery was consulted and patient was admitted hospital for further evaluation and treatment.  Patient ultimately underwent ex lap with lysis of adhesion, left colectomy, small bowel resection with primary anastomosis, debridement of abdominal wall abscess, and transverse colostomy by Dr. Dwain Sarna on 3/5.  He was transferred to ICU on a ventilator postoperatively.  New events last 24 hours / Subjective: Patient transferred to [facility] team today.  He was seen in ICU, has been extubated.  He has NG tube in place, IV TPN, wound VAC, Foley catheter, JP drain, colostomy.  He denies any complaints other than abdominal  pain.  Assessment & Plan:   Principal Problem:   Colocutaneous fistula Active Problems:   Chronic heart failure with preserved ejection fraction (HFpEF) (HCC)   Severe obstructive sleep apnea-hypopnea syndrome   Sleep related hypoxia   Type 2 diabetes mellitus with hyperglycemia, without long-term current use of insulin (HCC)   Paroxysmal atrial fibrillation (HCC)   History of pulmonary embolus (PE)   Abdominal wall abscess   Acute renal failure superimposed on stage 3 chronic kidney disease, unspecified acute renal failure type, unspecified whether stage 3a or 3b CKD (HCC)   Malnutrition of moderate degree   Hypotension   Colocutaneous fistula, abdominal wall abscess Septic shock, postoperative, not present on admission -Status post ex lap, left colectomy, small bowel resection, debridement of abdominal wall abscess, transverse colostomy by Dr. Dwain Sarna 3/5 -Remains on TPN -NG tube in place -Wound VAC in place -JP drain in place -Colostomy -Ceftriaxone, Flagyl -Per general surgery team -Leukocytosis mildly improved  AKI on CKD stage IIIa -Baseline creatinine 1.4-1.5 -Resolved, creatinine down to 1.04 today    Paroxysmal atrial fibrillation -Continue IV amiodarone while n.p.o.   Chronic HFpEF  -Remains stable   Hx of DVT/PE  -Holding Xarelto for now while n.p.o.  Plan to resume anticoagulation once cleared by surgery team   Type II DM  -Sliding scale insulin   DVT prophylaxis: SCD   Code Status: Full Family Communication:  None at bedside Disposition Plan: Home Status is: Inpatient Remains inpatient appropriate because: Remains on TPN    Antimicrobials:  Anti-infectives (From admission, onward)    Start     Dose/Rate Route Frequency Ordered Stop   06/01/23 1000  metroNIDAZOLE (FLAGYL) IVPB 500 mg  Status:  Discontinued        500 mg 100 mL/hr over 60 Minutes Intravenous Every 12 hours 05/31/23 2343 06/01/23 0103   06/01/23 0115  metroNIDAZOLE  (FLAGYL) IVPB 500 mg        500 mg 100 mL/hr over 60 Minutes Intravenous Every 12 hours 06/01/23 0103     05/31/23 1140  sodium chloride 0.9 % with cefoTEtan (CEFOTAN) ADS Med       Note to Pharmacy: Clovis Cao M: cabinet override      05/31/23 1140 05/31/23 1350   05/31/23 1115  cefoTEtan (CEFOTAN) 2 g in sodium chloride 0.9 % 100 mL IVPB        2 g 200 mL/hr over 30 Minutes Intravenous On call to O.R. 05/31/23 1020 05/31/23 1400   05/27/23 1000  metroNIDAZOLE (FLAGYL) tablet 500 mg  Status:  Discontinued        500 mg Oral Every 12 hours 05/27/23 0722 05/31/23 2343   05/23/23 2315  cefTRIAXone (ROCEPHIN) 2 g in sodium chloride 0.9 % 100 mL IVPB        2 g 200 mL/hr over 30 Minutes Intravenous Every 24 hours 05/23/23 2305     05/23/23 2315  metroNIDAZOLE (FLAGYL) IVPB 500 mg  Status:  Discontinued        500 mg 100 mL/hr over 60 Minutes Intravenous Every 12 hours 05/23/23 2305 05/27/23 0722        Objective: Vitals:   06/02/23 1100 06/02/23 1149 06/02/23 1200 06/02/23 1300  BP: 138/65   (!) 143/68  Pulse: 82 81 83 79  Resp: 19 19 20 18   Temp:  98.7 F (37.1 C)    TempSrc:  Axillary    SpO2: 99% 98% 98% 97%  Weight:      Height:        Intake/Output Summary (Last 24 hours) at 06/02/2023 1401 Last data filed at 06/02/2023 1300 Gross per 24 hour  Intake 4025.81 ml  Output 2175 ml  Net 1850.81 ml   Filed Weights   05/31/23 1125 06/01/23 0700 06/02/23 0500  Weight: (!) 149 kg (!) 149.8 kg (!) 151.1 kg    Examination:  General exam: Appears calm and comfortable  Respiratory system: Clear to auscultation. Respiratory effort normal. No respiratory distress. No conversational dyspnea.  Cardiovascular system: S1 & S2 heard, RRR. No murmurs.  Gastrointestinal system: Abdomen is nondistended,  + JP drain, colostomy, wound VAC in place Central nervous system: Alert and oriented Extremities: Symmetric in appearance   Data Reviewed: I have personally reviewed following labs  and imaging studies  CBC: Recent Labs  Lab 05/27/23 0423 05/28/23 0318 05/30/23 0303 05/31/23 1214 05/31/23 1533 05/31/23 1558 05/31/23 1751 06/01/23 0424 06/02/23 0415  WBC 7.4 12.1* 11.5*  --   --   --   --  24.8* 22.3*  HGB 9.9* 10.2* 9.9*   < > 9.5* 9.5* 9.9* 9.1* 8.2*  HCT 30.6* 32.0* 30.5*   < > 28.0* 28.0* 29.0* 27.2* 25.2*  MCV 96.2 97.0 95.9  --   --   --   --  93.5 95.5  PLT 380 411* 389  --   --   --   --  315 271   < > = values in this interval not displayed.   Basic Metabolic Panel: Recent Labs  Lab 05/27/23 0423 05/28/23 1610 05/29/23 9604 05/31/23 1214 05/31/23 1239 05/31/23 1414 05/31/23 1533 05/31/23 1558 05/31/23 1751 06/01/23 0424 06/02/23 5409  NA 134* 134* 133* 131* 133* 132* 133* 136 134* 130* 133*  K 4.2 4.3 4.1 6.0* 4.6 4.7 5.1 4.8 4.9 4.3 4.1  CL 98 98 99 106 104 103  --   --   --  102 103  CO2 27 25 26   --   --   --   --   --   --  23 24  GLUCOSE 145* 178* 161* 259* 259* 246*  --   --   --  257* 195*  BUN 31* 35* 34* 58* 36* 38*  --   --   --  41* 30*  CREATININE 1.27* 1.23 1.12 1.30* 1.10 1.10  --   --   --  1.20 1.04  CALCIUM 9.0 9.1 9.3  --   --   --   --   --   --  8.3* 8.6*  MG 2.0  --  1.8  --   --   --   --   --   --  1.5* 1.8  PHOS 3.0  --  2.7  --   --   --   --   --   --  2.5 3.0   GFR: Estimated Creatinine Clearance: 112.9 mL/min (by C-G formula based on SCr of 1.04 mg/dL). Liver Function Tests: Recent Labs  Lab 05/29/23 0338 06/01/23 0424  AST 18 15  ALT 15 14  ALKPHOS 43 32*  BILITOT 0.2 0.4  PROT 6.2* 5.8*  ALBUMIN 2.4* 2.6*   No results for input(s): "LIPASE", "AMYLASE" in the last 168 hours. No results for input(s): "AMMONIA" in the last 168 hours. Coagulation Profile: No results for input(s): "INR", "PROTIME" in the last 168 hours. Cardiac Enzymes: No results for input(s): "CKTOTAL", "CKMB", "CKMBINDEX", "TROPONINI" in the last 168 hours. BNP (last 3 results) No results for input(s): "PROBNP" in the last  8760 hours. HbA1C: No results for input(s): "HGBA1C" in the last 72 hours. CBG: Recent Labs  Lab 06/01/23 1959 06/01/23 2346 06/02/23 0409 06/02/23 0749 06/02/23 1148  GLUCAP 177* 184* 190* 174* 203*   Lipid Profile: No results for input(s): "CHOL", "HDL", "LDLCALC", "TRIG", "CHOLHDL", "LDLDIRECT" in the last 72 hours. Thyroid Function Tests: No results for input(s): "TSH", "T4TOTAL", "FREET4", "T3FREE", "THYROIDAB" in the last 72 hours. Anemia Panel: No results for input(s): "VITAMINB12", "FOLATE", "FERRITIN", "TIBC", "IRON", "RETICCTPCT" in the last 72 hours. Sepsis Labs: No results for input(s): "PROCALCITON", "LATICACIDVEN" in the last 168 hours.  Recent Results (from the past 240 hours)  Blood culture (routine x 2)     Status: None   Collection Time: 05/23/23  4:39 PM   Specimen: BLOOD LEFT ARM  Result Value Ref Range Status   Specimen Description BLOOD LEFT ARM  Final   Special Requests   Final    BOTTLES DRAWN AEROBIC AND ANAEROBIC Blood Culture adequate volume   Culture   Final    NO GROWTH 5 DAYS Performed at [facility], [address], [place], [place] [address]    Report Status 05/28/2023 FINAL  Final  Resp panel by RT-PCR (RSV, Flu A&B, Covid)     Status: None   Collection Time: 05/23/23  4:39 PM   Specimen: Nasal Swab  Result Value Ref Range Status   SARS Coronavirus 2 by RT PCR NEGATIVE NEGATIVE Final   Influenza A by PCR NEGATIVE NEGATIVE Final   Influenza B by PCR NEGATIVE NEGATIVE Final    Comment: (NOTE) The Xpert Xpress SARS-CoV-2/FLU/RSV plus assay is  intended as an aid in the diagnosis of influenza from Nasopharyngeal swab specimens and should not be used as a sole basis for treatment. Nasal washings and aspirates are unacceptable for Xpert Xpress SARS-CoV-2/FLU/RSV testing.  Fact Sheet for Patients: BloggerCourse.com  Fact Sheet for Healthcare Providers: SeriousBroker.it  This test is  not yet approved or cleared by the [place] FDA and has been authorized for detection and/or diagnosis of SARS-CoV-2 by FDA under an Emergency Use Authorization (EUA). This EUA will remain in effect (meaning this test can be used) for the duration of the COVID-19 declaration under Section 564(b)(1) of the Act, 21 U.S.C. section 360bbb-3(b)(1), unless the authorization is terminated or revoked.     Resp Syncytial Virus by PCR NEGATIVE NEGATIVE Final    Comment: (NOTE) Fact Sheet for Patients: BloggerCourse.com  Fact Sheet for Healthcare Providers: SeriousBroker.it  This test is not yet approved or cleared by the [place] FDA and has been authorized for detection and/or diagnosis of SARS-CoV-2 by FDA under an Emergency Use Authorization (EUA). This EUA will remain in effect (meaning this test can be used) for the duration of the COVID-19 declaration under Section 564(b)(1) of the Act, 21 U.S.C. section 360bbb-3(b)(1), unless the authorization is terminated or revoked.  Performed at [facility], [address] [place], [place] [address]   Blood culture (routine x 2)     Status: None   Collection Time: 05/23/23  4:43 PM   Specimen: BLOOD RIGHT ARM  Result Value Ref Range Status   Specimen Description BLOOD RIGHT ARM  Final   Special Requests   Final    BOTTLES DRAWN AEROBIC AND ANAEROBIC Blood Culture adequate volume   Culture   Final    NO GROWTH 5 DAYS Performed at [facility], [address], [place], [place] [address]    Report Status 05/28/2023 FINAL  Final  MRSA Next Gen by PCR, Nasal     Status: None   Collection Time: 05/31/23  6:08 PM   Specimen: Nasal Mucosa; Nasal Swab  Result Value Ref Range Status   MRSA by PCR Next Gen NOT DETECTED NOT DETECTED Final    Comment: (NOTE) The GeneXpert MRSA Assay (FDA approved for NASAL specimens only), is one component of a comprehensive MRSA colonization  surveillance program. It is not intended to diagnose MRSA infection nor to guide or monitor treatment for MRSA infections. Test performance is not FDA approved in patients less than 26 years old. Performed at [facility], [address], [place], [place] [address]       Radiology Studies: DG Abd Portable 1V Result Date: 06/01/2023 CLINICAL DATA:  Check gastric catheter placement EXAM: PORTABLE ABDOMEN - 1 VIEW COMPARISON:  None Available. FINDINGS: Gastric catheter is noted coiled within the stomach. Postsurgical changes in the lumbar spine are noted. No free air is seen. IMPRESSION: Gastric catheter coiled within the stomach. Electronically Signed   By: Alcide Clever M.D.   On: 06/01/2023 01:03   DG Abd Portable 1V Result Date: 05/31/2023 CLINICAL DATA:  Check gastric catheter placement EXAM: PORTABLE ABDOMEN - 1 VIEW COMPARISON:  None Available. FINDINGS: Gastric catheter is noted within the stomach. Proximal side port lies at the level of the gastroesophageal junction. This should be advanced deeper into the stomach. IMPRESSION: Gastric catheter as described. This should be advanced deeper into the stomach. Electronically Signed   By: Alcide Clever M.D.   On: 05/31/2023 22:33   Portable Chest x-ray Result  Date: 05/31/2023 CLINICAL DATA:  Endotracheal tube present. EXAM: PORTABLE CHEST 1 VIEW COMPARISON:  Radiograph 04/10/2019 FINDINGS: Endotracheal tube tip 5.3 cm from the carina at the level of the clavicular heads. Right upper extremity PICC tip in the upper SVC. Enteric tube tip below the diaphragm not included in the field of view. Low lung volumes without focal airspace disease. Normal heart size for technique. No pleural effusion or pneumothorax. IMPRESSION: 1. Endotracheal tube tip 5.3 cm from the carina. 2. Right upper extremity PICC tip in the upper SVC. 3. Low lung volumes without acute chest findings. Electronically Signed   By: Narda Rutherford M.D.   On: 05/31/2023 17:35   DG  Abd Portable 1V Result Date: 05/31/2023 CLINICAL DATA:  Nasogastric tube placement. EXAM: PORTABLE ABDOMEN - 1 VIEW COMPARISON:  None Available. FINDINGS: Enteric tube tip below the diaphragm in the stomach, the side port is just beyond the gastroesophageal junction. No bowel dilatation in the upper abdomen. IMPRESSION: Tip and side port of the enteric tube below the diaphragm in the stomach. Electronically Signed   By: Narda Rutherford M.D.   On: 05/31/2023 17:34      Scheduled Meds:  Chlorhexidine Gluconate Cloth  6 each Topical Daily   insulin aspart  0-20 Units Subcutaneous Q4H   insulin aspart  4 Units Subcutaneous Q4H   methocarbamol (ROBAXIN) injection  1,000 mg Intravenous Q8H   mouth rinse  15 mL Mouth Rinse Q2H   pantoprazole (PROTONIX) IV  40 mg Intravenous Daily   sodium chloride flush  3 mL Intravenous Q12H   Continuous Infusions:  acetaminophen Stopped (06/02/23 1253)   amiodarone 30 mg/hr (06/02/23 1300)   cefTRIAXone (ROCEPHIN)  IV Stopped (06/01/23 2356)   liver oil-zinc oxide     metronidazole Stopped (06/02/23 0928)   TPN ADULT (ION) 105 mL/hr at 06/02/23 1300   TPN ADULT (ION)       LOS: 10 days   Time spent: 40 minutes   Noralee Stain, DO [facility] 06/02/2023, 2:01 PM   Available via Epic secure chat 7am-7pm After these hours, please refer to coverage provider listed on [email or web address]

## 2023-06-02 NOTE — Progress Notes (Signed)
 NGT clamped at 1320 ... Pt education given. Pt verb understanding.

## 2023-06-02 NOTE — Progress Notes (Signed)
 PHARMACY - ANTICOAGULATION CONSULT NOTE  Pharmacy Consult for heparin Indication: atrial fibrillation, hx DVT/PE   Allergies  Allergen Reactions   Aldactone [Spironolactone] Hives   Bystolic [Nebivolol Hcl] Swelling   Motrin [Ibuprofen] Other (See Comments)    Rectal bleed   Actos [Pioglitazone] Other (See Comments) and Cough    Dyspnea  Edema   Trulicity [Dulaglutide] Other (See Comments) and Cough    Shortness of breath GI intolerance   Zestril [Lisinopril] Other (See Comments) and Cough    Wheezing    Cozaar [Losartan] Rash   Diflucan [Fluconazole] Rash   Hctz [Hydrochlorothiazide] Other (See Comments)    Gout flares.   Norvasc [Amlodipine] Itching and Other (See Comments)    Myalgias     Patient Measurements: Height: 6' (182.9 cm) Weight: (!) 151.1 kg (333 lb 1.8 oz) IBW/kg (Calculated) : 77.6 Heparin Dosing Weight: 113 kg  Vital Signs: Temp: 98.7 F (37.1 C) (03/07 1149) Temp Source: Axillary (03/07 1149) BP: 152/84 (03/07 1400) Pulse Rate: 78 (03/07 1400)  Labs: Recent Labs    05/31/23 0422 05/31/23 1214 05/31/23 1414 05/31/23 1533 06/01/23 0424 06/02/23 0415 06/02/23 1450  HGB  --    < > 10.2*   < > 9.1* 8.2* 8.2*  HCT  --    < > 30.0*   < > 27.2* 25.2* 25.2*  PLT  --   --   --   --  315 271 264  HEPARINUNFRC <0.10*  --   --   --   --   --   --   CREATININE  --    < > 1.10  --  1.20 1.04  --    < > = values in this interval not displayed.    Estimated Creatinine Clearance: 112.9 mL/min (by C-G formula based on SCr of 1.04 mg/dL).   Medical History: Past Medical History:  Diagnosis Date   CAD (coronary artery disease)    a. 12/2017: cath showing 10% Proximal-LAD stenosis with no significant obstructive disease and LVEDP mildly elevated at 18 mm Hg.    Chronic back pain    Diabetes mellitus, type 2 (HCC)    DVT (deep venous thrombosis) (HCC)    Essential hypertension    Family hx of colon cancer 05/03/2017   MI (myocardial infarction) (HCC)  2019   Morbid obesity (HCC)    Panic disorder    Shoulder pain, left    Following fall   Sleep apnea    Stroke (HCC) 01/2018   Vitamin D deficiency     Assessment: 62 yo M on rivaroxaban PTA for afib and hx DVT/PE. Patient was on heparin infusion 2/26 -3/3 then held for procedure 3/5. Now Surgery is ok with restarting with no bolus and Pharmacy was consulted for heparin.    Patient was last therapeutic on 2700-2750 units/hr. Hgb slowly down trending postop but no reported bleeding.   Goal of Therapy:  Heparin level 0.3-0.7 units/ml Monitor platelets by anticoagulation protocol: Yes   Plan:  Heparin 2750 units/hr F/u 8hr heparin level Monitor daily heparin level, CBC, signs/symptoms of bleeding  F/u restart rivaroxaban   Alphia Moh, PharmD, BCPS, BCCP Clinical Pharmacist  Please check AMION for all [facility] Pharmacy phone numbers After 10:00 PM, call Main Pharmacy [phone]

## 2023-06-02 NOTE — Progress Notes (Signed)
 PHARMACY - TOTAL PARENTERAL NUTRITION CONSULT NOTE  Indication:  chronic malnutrition chronic colocutaneous fistula/ subcutaneous abscess   Patient Measurements: Height: 6' (182.9 cm) Weight: (!) 151.1 kg (333 lb 1.8 oz) IBW/kg (Calculated) : 77.6 TPN AdjBW (KG): 95.5 Body mass index is 45.18 kg/m. Usual Weight: 152-155 kg (fluctuating over the past 6 months; 170 kg 04/2022) 2/26 standing weight 134 kg (confirmed with RN and patient) 3/01 standing wt =150.2kg per RN   Assessment:  77 YOM with with PMH significant for HTN, DM2, DVT/PE on Xarelto, PAF, HFpEF with a recent admission for LLQ IA abscess, colocutaneous fistula, and abdominal wall abscess on 04/27/2023, had IR drain placed into the LLQ abscess and the IA abscess aspirated on 1/31. He returned to the ED with concerns that the drain in his abdomen was not functioning and now is in need of surgery with a possible colostomy.   Per chart review, this has been going on for 8 months resulting in about 30 lbs weight loss. Of note, bed weights are significantly higher than standing weights. Per surgery, allow patient to eat freely by mouth. Patient has been eating 100% of eggs, sausage, cereal, toast, orange juice and coffee for breakfast and spaghetti, [place] and gravy, green beans, creamed potatoes for lunch/dinner. Pharmacy consulted to manage TPN to optimize nutrition prior to surgery.   Transitioned patient to compounded TPN 3/5 given that NPO for procedure and will likely have reduced PO intake post-op. Clinimix (no electrolytes) only met approximately 73% of kcal requirements.  Glucose / Insulin: A1c 6.5% on Farxiga, Januvia and metformin PTA CBGs improving Used 51 units SSI in the past 24 hrs, Novolog 4 units Q4H (16 units on 3/6), 20 units regular insulin in TPN Electrolytes: Na up to 133, others WNL Renal: SCr down 1.04 (BL ~1.5), BUN down to 30 Hepatic: LFTs / tbili  / TG WNL, prealbumin up 15 > 22 Intake / Output; MIVF: UOP  0.7 ml/kg/hr, NG , drain , LBM 3/4 GI Imaging:  2/25 CT: previously identified IA abscess dec with drain, air-fluid collection in tissue concerning for abd wall abscess  GI Surgeries / Procedures:  3/5 ex-lap with LOA, left colectomy, SBR  Central access: PICC placed 05/25/23 TPN start date: 05/25/23   Nutritional Goals: RD Estimated Needs Total Energy Estimated Needs: 2400-2600 kcal Total Protein Estimated Needs: 160-180 gm Total Fluid Estimated Needs: >2L/day  Current Nutrition:  TPN  2/27 FLD - ate 100% 2/28 heart diet - eating 100% of meals  3/5 NPO for surgery  Plan:  Continue TPN at goal rate of 183mL/hr to provide 160g AA and 2400 kCal, meeting 100% of nutritional needs.  Electrolytes in TPN: increase Na to 80 mEq/L, K 25 mEq/L, Ca 5 mEq/L, increase Mg to 6 mEq/L, Phos 10 mmol/L, max CL  Daily multivitamin and trace elements in TPN Continue resistant SSI Q4H Stop Novolog 4 units SQ Q4H with new TPN bag Increase insulin in TPN bag to 40 units  Repeat Mag sulfate 2gm IV x 1 Monitor TPN labs on Mon/Thurs, labs in AM F/u diet resumption   Marquetta Weiskopf D. Laney Potash, PharmD, BCPS, BCCCP 06/02/2023, 10:44 AM

## 2023-06-02 NOTE — Progress Notes (Addendum)
 2 Days Post-Op  Subjective: CC: Reports generalized abdominal pain that is not worse in one area. No nausea. NGT to LIWS with bilious output, 150cc/24 hours. No ostomy output. Foley with good uop. Worked with therapies yesterday.   Afebrile. No tachycardia or hypotension. On amio gtt. WBC down at 22.3 from 24.8. Hgb 8.2 from 9.1. TPN currently at 162ml/hr and was on LR mIVF yesterday at 172ml/hr. Cr/BUN improved.   Objective: Vital signs in last 24 hours: Temp:  [97.8 F (36.6 C)-98.6 F (37 C)] 97.8 F (36.6 C) (03/07 0751) Pulse Rate:  [82-98] 84 (03/07 0800) Resp:  [15-31] 20 (03/07 0800) BP: (72-159)/(37-91) 159/57 (03/07 0800) SpO2:  [92 %-100 %] 99 % (03/07 0800) Arterial Line BP: (143-160)/(65-86) 160/65 (03/06 1100) Weight:  [151.1 kg] 151.1 kg (03/07 0500) Last BM Date :  (prior to surgery on 3/5; minimal drainage from colostomy)  Intake/Output from previous day: 03/06 0701 - 03/07 0700 In: 4287.9 [I.V.:3758; IV Piggyback:499.9] Out: 3010 [Urine:2575; Emesis/NG output:150; Drains:285] Intake/Output this shift: No intake/output data recorded.  PE: Gen:  Alert, NAD, pleasant Abd: Soft, mild distension, generalized ttp, no rigidity or guarding. Midline wound clean as noted below. L sided abdominal wound clean as noted below. Stoma viable. JP drain SS w/ 185cc/24 hours GU: Foley with straw colored urine, 0.2ml/kg/hr over the last 24 hours.        Lab Results:  Recent Labs    06/01/23 0424 06/02/23 0415  WBC 24.8* 22.3*  HGB 9.1* 8.2*  HCT 27.2* 25.2*  PLT 315 271   BMET Recent Labs    06/01/23 0424 06/02/23 0415  NA 130* 133*  K 4.3 4.1  CL 102 103  CO2 23 24  GLUCOSE 257* 195*  BUN 41* 30*  CREATININE 1.20 1.04  CALCIUM 8.3* 8.6*   PT/INR No results for input(s): "LABPROT", "INR" in the last 72 hours. CMP     Component Value Date/Time   NA 133 (L) 06/02/2023 0415   NA 142 02/21/2023 1544   K 4.1 06/02/2023 0415   CL 103 06/02/2023 0415    CO2 24 06/02/2023 0415   GLUCOSE 195 (H) 06/02/2023 0415   BUN 30 (H) 06/02/2023 0415   BUN 18 02/21/2023 1544   CREATININE 1.04 06/02/2023 0415   CREATININE 1.50 (H) 05/11/2023 0300   CALCIUM 8.6 (L) 06/02/2023 0415   PROT 5.8 (L) 06/01/2023 0424   PROT 7.1 02/21/2023 1544   ALBUMIN 2.6 (L) 06/01/2023 0424   ALBUMIN 3.8 (L) 02/21/2023 1544   AST 15 06/01/2023 0424   ALT 14 06/01/2023 0424   ALKPHOS 32 (L) 06/01/2023 0424   BILITOT 0.4 06/01/2023 0424   BILITOT 0.3 02/21/2023 1544   GFRNONAA >60 06/02/2023 0415   GFRAA 68 12/16/2019 0000   Lipase     Component Value Date/Time   LIPASE 25 05/23/2023 1639    Studies/Results: DG Abd Portable 1V Result Date: 06/01/2023 CLINICAL DATA:  Check gastric catheter placement EXAM: PORTABLE ABDOMEN - 1 VIEW COMPARISON:  None Available. FINDINGS: Gastric catheter is noted coiled within the stomach. Postsurgical changes in the lumbar spine are noted. No free air is seen. IMPRESSION: Gastric catheter coiled within the stomach. Electronically Signed   By: Alcide Clever M.D.   On: 06/01/2023 01:03   DG Abd Portable 1V Result Date: 05/31/2023 CLINICAL DATA:  Check gastric catheter placement EXAM: PORTABLE ABDOMEN - 1 VIEW COMPARISON:  None Available. FINDINGS: Gastric catheter is noted within the stomach. Proximal side port  lies at the level of the gastroesophageal junction. This should be advanced deeper into the stomach. IMPRESSION: Gastric catheter as described. This should be advanced deeper into the stomach. Electronically Signed   By: Alcide Clever M.D.   On: 05/31/2023 22:33   Portable Chest x-ray Result Date: 05/31/2023 CLINICAL DATA:  Endotracheal tube present. EXAM: PORTABLE CHEST 1 VIEW COMPARISON:  Radiograph 04/10/2019 FINDINGS: Endotracheal tube tip 5.3 cm from the carina at the level of the clavicular heads. Right upper extremity PICC tip in the upper SVC. Enteric tube tip below the diaphragm not included in the field of view. Low lung  volumes without focal airspace disease. Normal heart size for technique. No pleural effusion or pneumothorax. IMPRESSION: 1. Endotracheal tube tip 5.3 cm from the carina. 2. Right upper extremity PICC tip in the upper SVC. 3. Low lung volumes without acute chest findings. Electronically Signed   By: Narda Rutherford M.D.   On: 05/31/2023 17:35   DG Abd Portable 1V Result Date: 05/31/2023 CLINICAL DATA:  Nasogastric tube placement. EXAM: PORTABLE ABDOMEN - 1 VIEW COMPARISON:  None Available. FINDINGS: Enteric tube tip below the diaphragm in the stomach, the side port is just beyond the gastroesophageal junction. No bowel dilatation in the upper abdomen. IMPRESSION: Tip and side port of the enteric tube below the diaphragm in the stomach. Electronically Signed   By: Narda Rutherford M.D.   On: 05/31/2023 17:34    Anti-infectives: Anti-infectives (From admission, onward)    Start     Dose/Rate Route Frequency Ordered Stop   06/01/23 1000  metroNIDAZOLE (FLAGYL) IVPB 500 mg  Status:  Discontinued        500 mg 100 mL/hr over 60 Minutes Intravenous Every 12 hours 05/31/23 2343 06/01/23 0103   06/01/23 0115  metroNIDAZOLE (FLAGYL) IVPB 500 mg        500 mg 100 mL/hr over 60 Minutes Intravenous Every 12 hours 06/01/23 0103     05/31/23 1140  sodium chloride 0.9 % with cefoTEtan (CEFOTAN) ADS Med       Note to Pharmacy: Clovis Cao M: cabinet override      05/31/23 1140 05/31/23 1350   05/31/23 1115  cefoTEtan (CEFOTAN) 2 g in sodium chloride 0.9 % 100 mL IVPB        2 g 200 mL/hr over 30 Minutes Intravenous On call to O.R. 05/31/23 1020 05/31/23 1400   05/27/23 1000  metroNIDAZOLE (FLAGYL) tablet 500 mg  Status:  Discontinued        500 mg Oral Every 12 hours 05/27/23 0722 05/31/23 2343   05/23/23 2315  cefTRIAXone (ROCEPHIN) 2 g in sodium chloride 0.9 % 100 mL IVPB        2 g 200 mL/hr over 30 Minutes Intravenous Every 24 hours 05/23/23 2305     05/23/23 2315  metroNIDAZOLE (FLAGYL) IVPB 500  mg  Status:  Discontinued        500 mg 100 mL/hr over 60 Minutes Intravenous Every 12 hours 05/23/23 2305 05/27/23 0722        Assessment/Plan POD 2 elap, SBR, left colectomy, transverse colostomy, debridement ab wall by Dr. 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 AROBF - Cont TPN  - Cont JP drain, currently SS - WOCN for new ostomy teaching. Will need ostomy clinic referral at d/c - Cont Vac, plan changes M/Th - Cont BID WTD  to left abdominal wound - D/c foley - Cont scheduled IV Tylenol & Robaxin + PRN Dilaudid. If pain not well controlled could consider PCA - Check CBC this PM. Hgb drifted down but could be dilutional. If hgb stable could restart heparin no bolus (for hx of PE/DVT/PAF) - Mobilize, PT - Pulm toilet, IS  FEN - NPO, NGT, TPN VTE - SCDs, as above ID - Rocephin/Flagyl Foley - TOV    LOS: 10 days    Jacinto Halim, [facility] 06/02/2023, 8:41 AM Please see Amion for pager number during day hours 7:00am-4:30pm

## 2023-06-02 NOTE — Consult Note (Signed)
 WOC Nurse Consult Note: Patient is premedicated with Dilaudid prior to wound and ostomy care.  Reason for Consult:Midline surgical wound, NPWT (VAC) dressing change with CCS PA, Michael at bedside.  Wound type:surgical Pressure Injury POA: NA Measurement: 21 cm x 7 cm x 8 cm at distal end Wound BJY:NWGNF red, friable, 10% adipose noted in wound bed Drainage (amount, consistency, odor) moderate serosagnguinous  no odor Periwound: LUQ colostomy Dressing procedure/placement/frequency: Cleanse with NS.  Barrier ring segment over umbilicus and at distal end near abdominal creasing to promote seal. 2 pieces black foam.  Cover with Drape and Trac pad. Seal achieved at 125 mmHg.  Change Monday and Thursday. Supplies in window seal for next pouch and VAC change.  WOC Nurse ostomy consult note Stoma type/location: LUQ colostomy Stomal assessment/size: 1 3/4" pale pink, dusky at 12 o'clock.  Moist, slightly budded, os at center Peristomal assessment: midline incision with VAC dressing.  Overlaps with drape.  Treatment options for stomal/peristomal skin: barrier ring 2 piece 2 3/4" pouch Output Blood tinged liquid only at this time. Ostomy pouching: 2pc. Flat with barrier ring  Education provided: Discussed with patient that we will work on pouch changes as he gets stronger.  Discussed twice weekly pouch changes and emptying multiple times daily.  Enrolled patient in [employer] DC program: Yes will today.  Will follow.  Mike Gip MSN, RN, [name]-BC CWON Wound, Ostomy, Continence Nurse [facility] [phone] Pager [phone]

## 2023-06-02 NOTE — Plan of Care (Signed)
  Problem: Education: Goal: Ability to describe self-care measures that may prevent or decrease complications (Diabetes Survival Skills Education) will improve Outcome: Progressing   Problem: Coping: Goal: Ability to adjust to condition or change in health will improve Outcome: Progressing   Problem: Fluid Volume: Goal: Ability to maintain a balanced intake and output will improve Outcome: Progressing   Problem: Health Behavior/Discharge Planning: Goal: Ability to identify and utilize available resources and services will improve Outcome: Progressing Goal: Ability to manage health-related needs will improve Outcome: Progressing   Problem: Metabolic: Goal: Ability to maintain appropriate glucose levels will improve Outcome: Progressing   Problem: Nutritional: Goal: Maintenance of adequate nutrition will improve Outcome: Progressing Goal: Progress toward achieving an optimal weight will improve Outcome: Progressing   Problem: Skin Integrity: Goal: Risk for impaired skin integrity will decrease Outcome: Progressing   Problem: Education: Goal: Knowledge of General Education information will improve Description: Including pain rating scale, medication(s)/side effects and non-pharmacologic comfort measures Outcome: Progressing   Problem: Health Behavior/Discharge Planning: Goal: Ability to manage health-related needs will improve Outcome: Progressing   Problem: Clinical Measurements: Goal: Ability to maintain clinical measurements within normal limits will improve Outcome: Progressing Goal: Will remain free from infection Outcome: Progressing Goal: Diagnostic test results will improve Outcome: Progressing Goal: Respiratory complications will improve Outcome: Progressing   Problem: Activity: Goal: Risk for activity intolerance will decrease Outcome: Progressing   Problem: Nutrition: Goal: Adequate nutrition will be maintained Outcome: Progressing   Problem:  Coping: Goal: Level of anxiety will decrease Outcome: Progressing   Problem: Elimination: Goal: Will not experience complications related to bowel motility Outcome: Progressing Goal: Will not experience complications related to urinary retention Outcome: Progressing   Problem: Pain Managment: Goal: General experience of comfort will improve and/or be controlled Outcome: Progressing   Problem: Safety: Goal: Ability to remain free from injury will improve Outcome: Progressing   Problem: Skin Integrity: Goal: Risk for impaired skin integrity will decrease Outcome: Progressing

## 2023-06-03 DIAGNOSIS — K632 Fistula of intestine: Secondary | ICD-10-CM | POA: Diagnosis not present

## 2023-06-03 LAB — CBC
HCT: 23.6 % — ABNORMAL LOW (ref 39.0–52.0)
Hemoglobin: 7.8 g/dL — ABNORMAL LOW (ref 13.0–17.0)
MCH: 32.6 pg (ref 26.0–34.0)
MCHC: 33.1 g/dL (ref 30.0–36.0)
MCV: 98.7 fL (ref 80.0–100.0)
Platelets: 295 10*3/uL (ref 150–400)
RBC: 2.39 MIL/uL — ABNORMAL LOW (ref 4.22–5.81)
RDW: 17.2 % — ABNORMAL HIGH (ref 11.5–15.5)
WBC: 19.3 10*3/uL — ABNORMAL HIGH (ref 4.0–10.5)
nRBC: 0 % (ref 0.0–0.2)

## 2023-06-03 LAB — PHOSPHORUS: Phosphorus: 3.6 mg/dL (ref 2.5–4.6)

## 2023-06-03 LAB — GLUCOSE, CAPILLARY
Glucose-Capillary: 154 mg/dL — ABNORMAL HIGH (ref 70–99)
Glucose-Capillary: 179 mg/dL — ABNORMAL HIGH (ref 70–99)
Glucose-Capillary: 196 mg/dL — ABNORMAL HIGH (ref 70–99)
Glucose-Capillary: 166 mg/dL — ABNORMAL HIGH (ref 70–99)
Glucose-Capillary: 163 mg/dL — ABNORMAL HIGH (ref 70–99)

## 2023-06-03 LAB — BASIC METABOLIC PANEL
Anion gap: 9 (ref 5–15)
BUN: 29 mg/dL — ABNORMAL HIGH (ref 8–23)
CO2: 20 mmol/L — ABNORMAL LOW (ref 22–32)
Calcium: 8.8 mg/dL — ABNORMAL LOW (ref 8.9–10.3)
Chloride: 107 mmol/L (ref 98–111)
Creatinine, Ser: 1.09 mg/dL (ref 0.61–1.24)
GFR, Estimated: >60 mL/min (ref >60)
Glucose, Bld: 178 mg/dL — ABNORMAL HIGH (ref 70–99)
Potassium: 4.3 mmol/L (ref 3.5–5.1)
Sodium: 136 mmol/L (ref 135–145)

## 2023-06-03 LAB — HEPARIN LEVEL (UNFRACTIONATED): Heparin Unfractionated: 0.33 [IU]/mL (ref 0.30–0.70)

## 2023-06-03 LAB — MAGNESIUM: Magnesium: 1.9 mg/dL (ref 1.7–2.4)

## 2023-06-03 MED ORDER — CHLORHEXIDINE GLUCONATE CLOTH 2 % EX PADS
6.0000 | MEDICATED_PAD | Freq: Every day | CUTANEOUS | Status: DC
  Administered 2023-06-05 – 2023-06-10 (×6): 6 via TOPICAL

## 2023-06-03 MED ORDER — HEPARIN SODIUM (PORCINE) 5000 UNIT/ML IJ SOLN
5000.0000 [IU] | Freq: Three times a day (TID) | INTRAMUSCULAR | Status: DC
Start: 2023-06-03 — End: 2023-06-04
  Administered 2023-06-03 – 2023-06-04 (×5): 5000 [IU] via SUBCUTANEOUS
  Filled 2023-06-03 (×5): qty 1

## 2023-06-03 MED ORDER — FENTANYL CITRATE PF 50 MCG/ML IJ SOSY
50.0000 ug | PREFILLED_SYRINGE | INTRAMUSCULAR | Status: DC | PRN
  Administered 2023-06-03 – 2023-06-05 (×16): 50 ug via INTRAVENOUS
  Filled 2023-06-03 (×16): qty 1

## 2023-06-03 MED ORDER — ACETAMINOPHEN 10 MG/ML IV SOLN
1000.0000 mg | Freq: Four times a day (QID) | INTRAVENOUS | Status: AC
  Administered 2023-06-04 (×2): 1000 mg via INTRAVENOUS
  Filled 2023-06-03 (×4): qty 100

## 2023-06-03 MED ORDER — ACETAMINOPHEN 500 MG PO TABS
1000.0000 mg | ORAL_TABLET | Freq: Four times a day (QID) | ORAL | Status: AC
  Filled 2023-06-03: qty 2

## 2023-06-03 MED ORDER — METRONIDAZOLE 500 MG/100ML IV SOLN
500.0000 mg | Freq: Two times a day (BID) | INTRAVENOUS | Status: AC
  Administered 2023-06-03: 500 mg via INTRAVENOUS
  Filled 2023-06-03: qty 100

## 2023-06-03 MED ORDER — METHOCARBAMOL 1000 MG/10ML IJ SOLN
1000.0000 mg | Freq: Three times a day (TID) | INTRAMUSCULAR | Status: DC
  Administered 2023-06-03 – 2023-06-04 (×2): 1000 mg via INTRAVENOUS
  Filled 2023-06-03 (×5): qty 10

## 2023-06-03 MED ORDER — METRONIDAZOLE 500 MG PO TABS
500.0000 mg | ORAL_TABLET | Freq: Two times a day (BID) | ORAL | Status: AC
  Administered 2023-06-04 – 2023-06-05 (×4): 500 mg via ORAL
  Filled 2023-06-03 (×5): qty 1

## 2023-06-03 MED ORDER — TRAVASOL 10 % IV SOLN
INTRAVENOUS | Status: AC
  Filled 2023-06-03: qty 1610.4

## 2023-06-03 MED ORDER — ACETAMINOPHEN 10 MG/ML IV SOLN
1000.0000 mg | Freq: Four times a day (QID) | INTRAVENOUS | Status: DC
  Administered 2023-06-03: 1000 mg via INTRAVENOUS
  Filled 2023-06-03: qty 100

## 2023-06-03 MED ORDER — METHOCARBAMOL 500 MG PO TABS
1000.0000 mg | ORAL_TABLET | Freq: Three times a day (TID) | ORAL | Status: DC
  Administered 2023-06-04 – 2023-06-07 (×7): 1000 mg via ORAL
  Filled 2023-06-03 (×16): qty 2

## 2023-06-03 NOTE — Progress Notes (Signed)
 PROGRESS NOTE    Bradley Torres  ZOX:[id number] DOB: 1961/01/05 DOA: 05/23/2023 PCP: Mechele Claude, MD     Brief Narrative:  62 year old male, with past medical history significant for morbid obesity, hypertension, type 2 diabetes mellitus, OSA with CPAP intolerance, history of DVT and PE on Xarelto, PAF, chronic HFpEF, and recent admission with LLQ intra-abdominal abscess, colocutaneous fistula, and abdominal wall abscess.  Patient presented to hospital with generalized weakness and malaise.  Of note patient had been admitted to the hospital 04/10/2023 and IR had a drain placed in the left lower quadrant abscess.  Patient was seen by ID and was discharged on Bactrim and Augmentin.  Patient developed malaise weakness lack of appetite and lightheadedness 3 to 4 days prior to admission.  On presentation to ED, patient was afebrile.  Labs revealed serum creatinine elevation at 2.9, and albumin of 2.6, and no leukocytosis.  Lactate was elevated at 3.1.  CT demonstrated percutaneous pigtail catheter in the left anterior abdominal wall with interval resolution/decrease in abscess at the catheter tip, but with persistent air-fluid collection along the catheter tract concerning for abdominal wall abscess measuring up to 6.1 cm.  Blood cultures were drawn and patient received 1 L of IV fluids.  General surgery was consulted and patient was admitted hospital for further evaluation and treatment.  Patient ultimately underwent ex lap with lysis of adhesion, left colectomy, small bowel resection with primary anastomosis, debridement of abdominal wall abscess, and transverse colostomy by Dr. Dwain Sarna on 3/5.  He was transferred to ICU on a ventilator postoperatively.  He was subsequently extubated and transferred to [facility] team 3/7.  New events last 24 hours / Subjective: Patient's NG tube was removed yesterday.  He has air in his colostomy bag today.  He states that Dilaudid hurts his stomach, getting some pain relief  with fentanyl.  Has not been out of bed.  Tolerating clear liquid diet  Assessment & Plan:   Principal Problem:   Colocutaneous fistula Active Problems:   Chronic heart failure with preserved ejection fraction (HFpEF) (HCC)   Severe obstructive sleep apnea-hypopnea syndrome   Sleep related hypoxia   Type 2 diabetes mellitus with hyperglycemia, without long-term current use of insulin (HCC)   Paroxysmal atrial fibrillation (HCC)   History of pulmonary embolus (PE)   Abdominal wall abscess   Acute renal failure superimposed on stage 3 chronic kidney disease, unspecified acute renal failure type, unspecified whether stage 3a or 3b CKD (HCC)   Malnutrition of moderate degree   Hypotension   Colocutaneous fistula, abdominal wall abscess Septic shock, postoperative, not present on admission -Status post ex lap, left colectomy, small bowel resection, debridement of abdominal wall abscess, transverse colostomy by Dr. Dwain Sarna 3/5 -Remains on TPN -NG tube removed -Wound VAC in place -JP drain in place -Colostomy -Ceftriaxone, Flagyl for 5 days -Per general surgery team -Leukocytosis improving  AKI on CKD stage IIIa -Baseline creatinine 1.4-1.5 -Resolved    Paroxysmal atrial fibrillation -Continue IV amiodarone until can take p.o.   Chronic HFpEF  -Remains stable   Hx of DVT/PE  -Holding Xarelto for now until can take p.o.  He was started on IV heparin yesterday --> on hold currently due to lower HCT.  Resume full anticoagulation once cleared from surgery team   Type II DM  -Sliding scale insulin  OSA -CPAP nightly   DVT prophylaxis: SCD heparin injection 5,000 Units Start: 06/03/23 0930 Place and maintain sequential compression device Start: 06/02/23 1410  Code Status: Full  Family Communication:  None at bedside Disposition Plan: Home Status is: Inpatient Remains inpatient appropriate because: Remains on TPN, IV antibiotics    Antimicrobials:  Anti-infectives  (From admission, onward)    Start     Dose/Rate Route Frequency Ordered Stop   06/01/23 1000  metroNIDAZOLE (FLAGYL) IVPB 500 mg  Status:  Discontinued        500 mg 100 mL/hr over 60 Minutes Intravenous Every 12 hours 05/31/23 2343 06/01/23 0103   06/01/23 0115  metroNIDAZOLE (FLAGYL) IVPB 500 mg        500 mg 100 mL/hr over 60 Minutes Intravenous Every 12 hours 06/01/23 0103     05/31/23 1140  sodium chloride 0.9 % with cefoTEtan (CEFOTAN) ADS Med       Note to Pharmacy: Clovis Cao M: cabinet override      05/31/23 1140 05/31/23 1350   05/31/23 1115  cefoTEtan (CEFOTAN) 2 g in sodium chloride 0.9 % 100 mL IVPB        2 g 200 mL/hr over 30 Minutes Intravenous On call to O.R. 05/31/23 1020 05/31/23 1400   05/27/23 1000  metroNIDAZOLE (FLAGYL) tablet 500 mg  Status:  Discontinued        500 mg Oral Every 12 hours 05/27/23 0722 05/31/23 2343   05/23/23 2315  cefTRIAXone (ROCEPHIN) 2 g in sodium chloride 0.9 % 100 mL IVPB        2 g 200 mL/hr over 30 Minutes Intravenous Every 24 hours 05/23/23 2305     05/23/23 2315  metroNIDAZOLE (FLAGYL) IVPB 500 mg  Status:  Discontinued        500 mg 100 mL/hr over 60 Minutes Intravenous Every 12 hours 05/23/23 2305 05/27/23 0722        Objective: Vitals:   06/03/23 0715 06/03/23 0800 06/03/23 0900 06/03/23 1000  BP:  (!) 156/63    Pulse: 82 86 79 82  Resp: 15 (!) 28 18 18   Temp:      TempSrc:      SpO2: 100% 100% 100% 100%  Weight:      Height:        Intake/Output Summary (Last 24 hours) at 06/03/2023 1126 Last data filed at 06/03/2023 1000 Gross per 24 hour  Intake 4509.12 ml  Output 3310 ml  Net 1199.12 ml   Filed Weights   06/01/23 0700 06/02/23 0500 06/03/23 0431  Weight: (!) 149.8 kg (!) 151.1 kg (!) 152.4 kg    Examination:  General exam: Appears calm and comfortable  Respiratory system: Clear to auscultation. Respiratory effort normal. No respiratory distress. No conversational dyspnea.  Cardiovascular system: S1 &  S2 heard, RRR. No murmurs.  Gastrointestinal system: Abdomen is nondistended,  + JP drain, colostomy, wound VAC in place Central nervous system: Alert and oriented Extremities: Symmetric in appearance   Data Reviewed: I have personally reviewed following labs and imaging studies  CBC: Recent Labs  Lab 05/30/23 0303 05/31/23 1214 05/31/23 1751 06/01/23 0424 06/02/23 0415 06/02/23 1450 06/03/23 0558  WBC 11.5*  --   --  24.8* 22.3* 21.1* 19.3*  HGB 9.9*   < > 9.9* 9.1* 8.2* 8.2* 7.8*  HCT 30.5*   < > 29.0* 27.2* 25.2* 25.2* 23.6*  MCV 95.9  --   --  93.5 95.5 96.6 98.7  PLT 389  --   --  315 271 264 295   < > = values in this interval not displayed.   Basic Metabolic Panel: Recent Labs  Lab 05/28/23 [id number]  05/29/23 1610 05/31/23 1214 05/31/23 1239 05/31/23 1414 05/31/23 1533 05/31/23 1558 05/31/23 1751 06/01/23 0424 06/02/23 0415 06/03/23 0824  NA 134* 133*   < > 133* 132*   < > 136 134* 130* 133* 136  K 4.3 4.1   < > 4.6 4.7   < > 4.8 4.9 4.3 4.1 4.3  CL 98 99   < > 104 103  --   --   --  102 103 107  CO2 25 26  --   --   --   --   --   --  23 24 20*  GLUCOSE 178* 161*   < > 259* 246*  --   --   --  257* 195* 178*  BUN 35* 34*   < > 36* 38*  --   --   --  41* 30* 29*  CREATININE 1.23 1.12   < > 1.10 1.10  --   --   --  1.20 1.04 1.09  CALCIUM 9.1 9.3  --   --   --   --   --   --  8.3* 8.6* 8.8*  MG  --  1.8  --   --   --   --   --   --  1.5* 1.8 1.9  PHOS  --  2.7  --   --   --   --   --   --  2.5 3.0 3.6   < > = values in this interval not displayed.   GFR: Estimated Creatinine Clearance: 108.2 mL/min (by C-G formula based on SCr of 1.09 mg/dL). Liver Function Tests: Recent Labs  Lab 05/29/23 0338 06/01/23 0424  AST 18 15  ALT 15 14  ALKPHOS 43 32*  BILITOT 0.2 0.4  PROT 6.2* 5.8*  ALBUMIN 2.4* 2.6*   No results for input(s): "LIPASE", "AMYLASE" in the last 168 hours. No results for input(s): "AMMONIA" in the last 168 hours. Coagulation Profile: No  results for input(s): "INR", "PROTIME" in the last 168 hours. Cardiac Enzymes: No results for input(s): "CKTOTAL", "CKMB", "CKMBINDEX", "TROPONINI" in the last 168 hours. BNP (last 3 results) No results for input(s): "PROBNP" in the last 8760 hours. HbA1C: No results for input(s): "HGBA1C" in the last 72 hours. CBG: Recent Labs  Lab 06/02/23 1534 06/02/23 1916 06/02/23 2307 06/03/23 0259 06/03/23 0715  GLUCAP 199* 187* 176* 154* 179*   Lipid Profile: No results for input(s): "CHOL", "HDL", "LDLCALC", "TRIG", "CHOLHDL", "LDLDIRECT" in the last 72 hours. Thyroid Function Tests: No results for input(s): "TSH", "T4TOTAL", "FREET4", "T3FREE", "THYROIDAB" in the last 72 hours. Anemia Panel: No results for input(s): "VITAMINB12", "FOLATE", "FERRITIN", "TIBC", "IRON", "RETICCTPCT" in the last 72 hours. Sepsis Labs: No results for input(s): "PROCALCITON", "LATICACIDVEN" in the last 168 hours.  Recent Results (from the past 240 hours)  MRSA Next Gen by PCR, Nasal     Status: None   Collection Time: 05/31/23  6:08 PM   Specimen: Nasal Mucosa; Nasal Swab  Result Value Ref Range Status   MRSA by PCR Next Gen NOT DETECTED NOT DETECTED Final    Comment: (NOTE) The GeneXpert MRSA Assay (FDA approved for NASAL specimens only), is one component of a comprehensive MRSA colonization surveillance program. It is not intended to diagnose MRSA infection nor to guide or monitor treatment for MRSA infections. Test performance is not FDA approved in patients less than 55 years old. Performed at [facility], [address] [place], [place] [address]  Radiology Studies: No results found.     Scheduled Meds:  Chlorhexidine Gluconate Cloth  6 each Topical Daily   heparin injection (subcutaneous)  5,000 Units Subcutaneous Q8H   insulin aspart  0-20 Units Subcutaneous Q4H   methocarbamol (ROBAXIN) injection  1,000 mg Intravenous Q8H   pantoprazole (PROTONIX) IV  40 mg Intravenous  Daily   Continuous Infusions:  acetaminophen     amiodarone 30 mg/hr (06/03/23 1000)   cefTRIAXone (ROCEPHIN)  IV Stopped (06/03/23 0000)   liver oil-zinc oxide     metronidazole Stopped (06/03/23 0948)   TPN ADULT (ION) 105 mL/hr at 06/03/23 1000   TPN ADULT (ION)       LOS: 11 days   Time spent: 30 minutes   Noralee Stain, DO [facility] 06/03/2023, 11:26 AM   Available via Epic secure chat 7am-7pm After these hours, please refer to coverage provider listed on [email or web address]

## 2023-06-03 NOTE — Progress Notes (Signed)
 PHARMACY - ANTICOAGULATION CONSULT NOTE  Pharmacy Consult for heparin Indication: atrial fibrillation, hx DVT/PE   Allergies  Allergen Reactions   Aldactone [Spironolactone] Hives   Bystolic [Nebivolol Hcl] Swelling   Motrin [Ibuprofen] Other (See Comments)    Rectal bleed   Actos [Pioglitazone] Other (See Comments) and Cough    Dyspnea  Edema   Trulicity [Dulaglutide] Other (See Comments) and Cough    Shortness of breath GI intolerance   Zestril [Lisinopril] Other (See Comments) and Cough    Wheezing    Cozaar [Losartan] Rash   Diflucan [Fluconazole] Rash   Hctz [Hydrochlorothiazide] Other (See Comments)    Gout flares.   Norvasc [Amlodipine] Itching and Other (See Comments)    Myalgias     Patient Measurements: Height: 6' (182.9 cm) Weight: (!) 151.1 kg (333 lb 1.8 oz) IBW/kg (Calculated) : 77.6 Heparin Dosing Weight: 113 kg  Vital Signs: Temp: 98.7 F (37.1 C) (03/07 2307) Temp Source: Axillary (03/07 2307) BP: 145/68 (03/08 0000) Pulse Rate: 83 (03/08 0050)  Labs: Recent Labs    05/31/23 0422 05/31/23 1214 05/31/23 1414 05/31/23 1533 06/01/23 0424 06/02/23 0415 06/02/23 1450 06/03/23 0032  HGB  --    < > 10.2*   < > 9.1* 8.2* 8.2*  --   HCT  --    < > 30.0*   < > 27.2* 25.2* 25.2*  --   PLT  --   --   --   --  315 271 264  --   HEPARINUNFRC <0.10*  --   --   --   --   --   --  0.33  CREATININE  --    < > 1.10  --  1.20 1.04  --   --    < > = values in this interval not displayed.    Estimated Creatinine Clearance: 112.9 mL/min (by C-G formula based on SCr of 1.04 mg/dL).   Medical History: Past Medical History:  Diagnosis Date   CAD (coronary artery disease)    a. 12/2017: cath showing 10% Proximal-LAD stenosis with no significant obstructive disease and LVEDP mildly elevated at 18 mm Hg.    Chronic back pain    Diabetes mellitus, type 2 (HCC)    DVT (deep venous thrombosis) (HCC)    Essential hypertension    Family hx of colon cancer  05/03/2017   MI (myocardial infarction) (HCC) 2019   Morbid obesity (HCC)    Panic disorder    Shoulder pain, left    Following fall   Sleep apnea    Stroke (HCC) 01/2018   Vitamin D deficiency     Assessment: 63 yo M on rivaroxaban PTA for afib and hx DVT/PE. Patient was on heparin infusion 2/26 -3/3 then held for procedure 3/5. Now Surgery is ok with restarting with no bolus and Pharmacy was consulted for heparin.    Patient was last therapeutic on 2700-2750 units/hr. Hgb slowly down trending postop but no reported bleeding.   3/8 AM update:  Heparin level therapeutic after heparin re-start  Goal of Therapy:  Heparin level 0.3-0.7 units/ml Monitor platelets by anticoagulation protocol: Yes   Plan:  Cont heparin 2750 units/hr Heparin level in 8 hours Monitor for bleeding F/u restart rivaroxaban   Abran Duke, PharmD, BCPS Clinical Pharmacist Phone: [phone]

## 2023-06-03 NOTE — Progress Notes (Signed)
 Pt transferred to 3E07 with all belongings at bedside. Hand off given to Mayo, Charity fundraiser.

## 2023-06-03 NOTE — Progress Notes (Signed)
 A consult was placed to IV Therapy for additional IV access; pt has a DL PICC but is requiring a PIV due to multiple iv medications;  Left forearm is significantly swollen and tight from an infiltration; Left upper arm assessed by 2 IV RNs with ultrasound; cephalic vein does not compress, and basilic and brachial veins were too deep for a midline;  suggest exchanging DL PICC for a TL PICC;  RN aware.

## 2023-06-03 NOTE — Progress Notes (Signed)
 3 Days Post-Op   Subjective/Chief Complaint: Air in bag, doing well   Objective: Vital signs in last 24 hours: Temp:  [97.7 F (36.5 C)-98.7 F (37.1 C)] 98.2 F (36.8 C) (03/08 0700) Pulse Rate:  [75-86] 82 (03/08 0715) Resp:  [13-20] 15 (03/08 0715) BP: (138-171)/(59-84) 139/59 (03/08 0402) SpO2:  [97 %-100 %] 100 % (03/08 0715) Weight:  [152.4 kg] 152.4 kg (03/08 0431) Last BM Date : 06/02/23  Intake/Output from previous day: 03/07 0701 - 03/08 0700 In: 4175.7 [I.V.:3419; IV Piggyback:756.7] Out: 3260 [Urine:3175; Drains:85] Intake/Output this shift: No intake/output data recorded.  Ab vac in place, jp bulb not attached now so not sure of output, stoma pink and functional  Lab Results:  Recent Labs    06/02/23 1450 06/03/23 0558  WBC 21.1* 19.3*  HGB 8.2* 7.8*  HCT 25.2* 23.6*  PLT 264 295   BMET Recent Labs    06/01/23 0424 06/02/23 0415  NA 130* 133*  K 4.3 4.1  CL 102 103  CO2 23 24  GLUCOSE 257* 195*  BUN 41* 30*  CREATININE 1.20 1.04  CALCIUM 8.3* 8.6*   PT/INR No results for input(s): "LABPROT", "INR" in the last 72 hours. ABG Recent Labs    05/31/23 1558 05/31/23 1751  PHART 7.357 7.401  HCO3 24.7 23.6    Studies/Results: No results found.  Anti-infectives: Anti-infectives (From admission, onward)    Start     Dose/Rate Route Frequency Ordered Stop   06/01/23 1000  metroNIDAZOLE (FLAGYL) IVPB 500 mg  Status:  Discontinued        500 mg 100 mL/hr over 60 Minutes Intravenous Every 12 hours 05/31/23 2343 06/01/23 0103   06/01/23 0115  metroNIDAZOLE (FLAGYL) IVPB 500 mg        500 mg 100 mL/hr over 60 Minutes Intravenous Every 12 hours 06/01/23 0103     05/31/23 1140  sodium chloride 0.9 % with cefoTEtan (CEFOTAN) ADS Med       Note to Pharmacy: Clovis Cao M: cabinet override      05/31/23 1140 05/31/23 1350   05/31/23 1115  cefoTEtan (CEFOTAN) 2 g in sodium chloride 0.9 % 100 mL IVPB        2 g 200 mL/hr over 30 Minutes  Intravenous On call to O.R. 05/31/23 1020 05/31/23 1400   05/27/23 1000  metroNIDAZOLE (FLAGYL) tablet 500 mg  Status:  Discontinued        500 mg Oral Every 12 hours 05/27/23 0722 05/31/23 2343   05/23/23 2315  cefTRIAXone (ROCEPHIN) 2 g in sodium chloride 0.9 % 100 mL IVPB        2 g 200 mL/hr over 30 Minutes Intravenous Every 24 hours 05/23/23 2305     05/23/23 2315  metroNIDAZOLE (FLAGYL) IVPB 500 mg  Status:  Discontinued        500 mg 100 mL/hr over 60 Minutes Intravenous Every 12 hours 05/23/23 2305 05/27/23 0865       Assessment/Plan: POD 3 elap, SBR, left colectomy, transverse colostomy, debridement ab wall by Dr. 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 Will need ostomy clinic referral at d/c - Cont Vac, plan changes M/Th - Cont BID WTD to left abdominal wound - Cont  scheduled IV Tylenol & Robaxin + PRN Dilaudid.  - hct lower today, will hold heparin (just do subcutaneous heparin), recheck hct in am - Mobilize, PT - Pulm toilet, IS   FEN - clears TPN VTE - SCDs, as above ID - Rocephin/Flagyl 3/5 Foley - out Dispo has tx orders to floor   Emelia Loron 06/03/2023

## 2023-06-03 NOTE — Progress Notes (Signed)
 PHARMACY - TOTAL PARENTERAL NUTRITION CONSULT NOTE  Indication:  chronic malnutrition chronic colocutaneous fistula/ subcutaneous abscess   Patient Measurements: Height: 6' (182.9 cm) Weight: (!) 152.4 kg (335 lb 15.7 oz) IBW/kg (Calculated) : 77.6 TPN AdjBW (KG): 95.5 Body mass index is 45.57 kg/m. Usual Weight: 152-155 kg (fluctuating over the past 6 months; 170 kg 04/2022) 2/26 standing weight 134 kg (confirmed with RN and patient) 3/01 standing wt =150.2kg per RN   Assessment:  16 YOM with with PMH significant for HTN, DM2, DVT/PE on Xarelto, PAF, HFpEF with a recent admission for LLQ IA abscess, colocutaneous fistula, and abdominal wall abscess on 04/27/2023, had IR drain placed into the LLQ abscess and the IA abscess aspirated on 1/31. He returned to the ED with concerns that the drain in his abdomen was not functioning and now is in need of surgery with a possible colostomy.   Per chart review, this has been going on for 8 months resulting in about 30 lbs weight loss. Of note, bed weights are significantly higher than standing weights. Per surgery, allow patient to eat freely by mouth. Patient has been eating 100% of eggs, sausage, cereal, toast, orange juice and coffee for breakfast and spaghetti, [place] and gravy, green beans, creamed potatoes for lunch/dinner. Pharmacy consulted to manage TPN to optimize nutrition prior to surgery.   Transitioned patient to compounded TPN 3/5 given that NPO for procedure and will likely have reduced PO intake post-op. Clinimix (no electrolytes) only met approximately 73% of kcal requirements.  Glucose / Insulin: A1c 6.5% on Farxiga, Januvia and metformin PTA CBGs improving Used 29 units SSI in the past 24 hrs (down), off Novolog 4 units Q4H (20 units on 3/7), 40 units regular insulin in TPN Electrolytes: Na up to 136, low CO2, others WNL (Phos trending up) Renal: SCr 1.09 (BL ~1.5), BUN down to 29 Hepatic: LFTs / tbili  / TG WNL, prealbumin up 15  > 22 Intake / Output; MIVF: UOP 0.7 ml/kg/hr, NG , drain , LBM 3/4 GI Imaging:  2/25 CT: previously identified IA abscess dec with drain, air-fluid collection in tissue concerning for abd wall abscess  GI Surgeries / Procedures:  3/5 ex-lap with LOA, left colectomy, SBR  Central access: PICC placed 05/25/23 TPN start date: 05/25/23   Nutritional Goals: RD Estimated Needs Total Energy Estimated Needs: 2400-2600 kcal Total Protein Estimated Needs: 160-180 gm Total Fluid Estimated Needs: >2L/day  Current Nutrition:  TPN (lytes added 3/7) 2/27 FLD - ate 100% 2/28 heart diet - eating 100% of meals  3/5 NPO for surgery  Plan:  Adjust TPN rate to 110 ml/hr to provide volume for acid-base adjustment TPN to provide 161g AA and 2411 kCal, meeting 100% of nutritional needs.  Electrolytes in TPN: Na 80 mEq/L, K 25 mEq/L, Ca 5 mEq/L, increase Mg to 6 mEq/L on 3/7, reduce Phos to 7.5 mmol/L, change to Cl:Ac 1:1 Daily multivitamin and trace elements in TPN Continue resistant SSI Q4H and increase insulin in TPN bag to 50 units  Monitor TPN labs on Mon/Thurs F/u diet resumption   Lukisha Procida D. Laney Potash, PharmD, BCPS, BCCCP 06/03/2023, 11:04 AM

## 2023-06-04 ENCOUNTER — Other Ambulatory Visit: Payer: Self-pay

## 2023-06-04 DIAGNOSIS — E440 Moderate protein-calorie malnutrition: Secondary | ICD-10-CM | POA: Diagnosis not present

## 2023-06-04 DIAGNOSIS — D631 Anemia in chronic kidney disease: Secondary | ICD-10-CM | POA: Diagnosis not present

## 2023-06-04 DIAGNOSIS — R6521 Severe sepsis with septic shock: Secondary | ICD-10-CM | POA: Diagnosis not present

## 2023-06-04 DIAGNOSIS — E872 Acidosis, unspecified: Secondary | ICD-10-CM | POA: Diagnosis not present

## 2023-06-04 DIAGNOSIS — Z1152 Encounter for screening for COVID-19: Secondary | ICD-10-CM | POA: Diagnosis not present

## 2023-06-04 DIAGNOSIS — I130 Hypertensive heart and chronic kidney disease with heart failure and stage 1 through stage 4 chronic kidney disease, or unspecified chronic kidney disease: Secondary | ICD-10-CM | POA: Diagnosis not present

## 2023-06-04 DIAGNOSIS — A419 Sepsis, unspecified organism: Secondary | ICD-10-CM | POA: Diagnosis not present

## 2023-06-04 DIAGNOSIS — K651 Peritoneal abscess: Secondary | ICD-10-CM | POA: Diagnosis not present

## 2023-06-04 DIAGNOSIS — E871 Hypo-osmolality and hyponatremia: Secondary | ICD-10-CM | POA: Diagnosis not present

## 2023-06-04 DIAGNOSIS — N179 Acute kidney failure, unspecified: Secondary | ICD-10-CM | POA: Diagnosis not present

## 2023-06-04 DIAGNOSIS — K632 Fistula of intestine: Secondary | ICD-10-CM | POA: Diagnosis not present

## 2023-06-04 LAB — GLUCOSE, CAPILLARY
Glucose-Capillary: 143 mg/dL — ABNORMAL HIGH (ref 70–99)
Glucose-Capillary: 145 mg/dL — ABNORMAL HIGH (ref 70–99)
Glucose-Capillary: 158 mg/dL — ABNORMAL HIGH (ref 70–99)
Glucose-Capillary: 160 mg/dL — ABNORMAL HIGH (ref 70–99)
Glucose-Capillary: 163 mg/dL — ABNORMAL HIGH (ref 70–99)
Glucose-Capillary: 201 mg/dL — ABNORMAL HIGH (ref 70–99)

## 2023-06-04 LAB — CBC
HCT: 27.7 % — ABNORMAL LOW (ref 39.0–52.0)
Hemoglobin: 8.9 g/dL — ABNORMAL LOW (ref 13.0–17.0)
MCH: 30.9 pg (ref 26.0–34.0)
MCHC: 32.1 g/dL (ref 30.0–36.0)
MCV: 96.2 fL (ref 80.0–100.0)
Platelets: 374 10*3/uL (ref 150–400)
RBC: 2.88 MIL/uL — ABNORMAL LOW (ref 4.22–5.81)
RDW: 16.8 % — ABNORMAL HIGH (ref 11.5–15.5)
WBC: 17.7 10*3/uL — ABNORMAL HIGH (ref 4.0–10.5)
nRBC: 0 % (ref 0.0–0.2)

## 2023-06-04 MED ORDER — HEPARIN (PORCINE) 25000 UT/250ML-% IV SOLN
2300.0000 [IU]/h | INTRAVENOUS | Status: DC
  Administered 2023-06-04 – 2023-06-05 (×2): 2750 [IU]/h via INTRAVENOUS
  Administered 2023-06-05 (×2): 2700 [IU]/h via INTRAVENOUS
  Administered 2023-06-06: 2300 [IU]/h via INTRAVENOUS
  Administered 2023-06-06: 2500 [IU]/h via INTRAVENOUS
  Administered 2023-06-07: 2300 [IU]/h via INTRAVENOUS
  Filled 2023-06-04 (×7): qty 250

## 2023-06-04 MED ORDER — TRAVASOL 10 % IV SOLN
INTRAVENOUS | Status: AC
  Filled 2023-06-04: qty 805.2

## 2023-06-04 MED ORDER — ACETAMINOPHEN 500 MG PO TABS
1000.0000 mg | ORAL_TABLET | Freq: Four times a day (QID) | ORAL | Status: AC
  Administered 2023-06-04: 1000 mg via ORAL
  Filled 2023-06-04 (×2): qty 2

## 2023-06-04 MED ORDER — PANTOPRAZOLE SODIUM 40 MG PO TBEC
40.0000 mg | DELAYED_RELEASE_TABLET | Freq: Every day | ORAL | Status: DC
  Administered 2023-06-04 – 2023-06-10 (×7): 40 mg via ORAL
  Filled 2023-06-04 (×7): qty 1

## 2023-06-04 MED ORDER — ONDANSETRON 4 MG PO TBDP
4.0000 mg | ORAL_TABLET | Freq: Four times a day (QID) | ORAL | Status: DC | PRN
Start: 2023-06-04 — End: 2023-06-11
  Administered 2023-06-04 – 2023-06-06 (×3): 4 mg via ORAL
  Filled 2023-06-04 (×3): qty 1

## 2023-06-04 MED ORDER — ACETAMINOPHEN 10 MG/ML IV SOLN
1000.0000 mg | Freq: Four times a day (QID) | INTRAVENOUS | Status: DC
  Filled 2023-06-04 (×2): qty 100

## 2023-06-04 NOTE — Plan of Care (Signed)
  Problem: Education: Goal: Ability to describe self-care measures that may prevent or decrease complications (Diabetes Survival Skills Education) will improve Outcome: Progressing   Problem: Coping: Goal: Ability to adjust to condition or change in health will improve Outcome: Progressing   Problem: Fluid Volume: Goal: Ability to maintain a balanced intake and output will improve Outcome: Progressing   Problem: Health Behavior/Discharge Planning: Goal: Ability to identify and utilize available resources and services will improve Outcome: Progressing Goal: Ability to manage health-related needs will improve Outcome: Progressing   Problem: Metabolic: Goal: Ability to maintain appropriate glucose levels will improve Outcome: Progressing   Problem: Nutritional: Goal: Maintenance of adequate nutrition will improve Outcome: Progressing Goal: Progress toward achieving an optimal weight will improve Outcome: Progressing   Problem: Skin Integrity: Goal: Risk for impaired skin integrity will decrease Outcome: Progressing   Problem: Education: Goal: Knowledge of General Education information will improve Description: Including pain rating scale, medication(s)/side effects and non-pharmacologic comfort measures Outcome: Progressing   Problem: Health Behavior/Discharge Planning: Goal: Ability to manage health-related needs will improve Outcome: Progressing   Problem: Clinical Measurements: Goal: Ability to maintain clinical measurements within normal limits will improve Outcome: Progressing Goal: Will remain free from infection Outcome: Progressing Goal: Diagnostic test results will improve Outcome: Progressing Goal: Respiratory complications will improve Outcome: Progressing   Problem: Activity: Goal: Risk for activity intolerance will decrease Outcome: Progressing   Problem: Nutrition: Goal: Adequate nutrition will be maintained Outcome: Progressing   Problem:  Coping: Goal: Level of anxiety will decrease Outcome: Progressing   Problem: Elimination: Goal: Will not experience complications related to bowel motility Outcome: Progressing Goal: Will not experience complications related to urinary retention Outcome: Progressing   Problem: Pain Managment: Goal: General experience of comfort will improve and/or be controlled Outcome: Progressing   Problem: Safety: Goal: Ability to remain free from injury will improve Outcome: Progressing   Problem: Skin Integrity: Goal: Risk for impaired skin integrity will decrease Outcome: Progressing

## 2023-06-04 NOTE — Progress Notes (Signed)
 Physical Therapy Treatment Patient Details Name: Bradley Torres MRN: [id number] DOB: 10-May-1960 Today's Date: 06/04/2023   History of Present Illness Pt is a 63 y.o. male admitted 2/25 with acute renal failure and chronic colocutaneous fistula. He underwent L colectomy and transverse colostomy 3/5. ICU stay from 3/5-3/8. PMH: HTN, DMII, morbid obesity, OSA with CPAP intolerance, h/o DVT and PE on Xarelto, PAF, chronic HFpEF    PT Comments  Pt in bed upon arrival and agreeable to PT session with encouragement. Pt was able to progress in today's session by needing less assistance for bed mobility and step pivot transfer. Pt required CGA to roll and ModA to elevate trunk. Initially, pt did not want any help to stand with PT close by for supervision. Pt was unable to clear his hips and requested assistance. Pt was able to complete stand with ModA for boost up and x2 for safety with lines. Pt was then able to perform a step pivot transfer to the recliner with CGA x2 for safety. Pt declined further gait training 2/2 pain despite pre-medication. Pt would continue to benefit from <3hrs post acute rehab to work towards independence with mobility. Pt is progressing towards goals. Acute PT to follow.      If plan is discharge home, recommend the following: A lot of help with bathing/dressing/bathroom;A lot of help with walking and/or transfers;Help with stairs or ramp for entrance;Assistance with cooking/housework;Assist for transportation   Can travel by private vehicle     No  Equipment Recommendations  None recommended by PT       Precautions / Restrictions Precautions Precautions: Fall;Other (comment) (abdominal) Precaution/Restrictions Comments: abdominal wound vac, R JP drain Restrictions Weight Bearing Restrictions Per Provider Order: No     Mobility  Bed Mobility Overal bed mobility: Needs Assistance Bed Mobility: Rolling, Sidelying to Sit Rolling: Contact guard assist, Used  rails Sidelying to sit: Mod assist, HOB elevated    General bed mobility comments: pt able to roll with CGA for safety, ModA to raise trunk.    Transfers Overall transfer level: Needs assistance Equipment used: Rolling walker (2 wheels) Transfers: Sit to/from Stand, Bed to chair/wheelchair/BSC Sit to Stand: +2 safety/equipment, Mod assist   Step pivot transfers: Contact guard assist, +2 safety/equipment     General transfer comment: able to stand with ModAx2 for safety with lines. Pt originally did not want assistance and attempted to stand with close supervision. Pt was unable and then requested assistance. Pt needed ModA to boost up.    Ambulation/Gait  General Gait Details: pt declined further gait      Balance Overall balance assessment: Needs assistance Sitting-balance support: Feet supported Sitting balance-Leahy Scale: Fair Sitting balance - Comments: holding on to foot board and rail   Standing balance support: Bilateral upper extremity supported, During functional activity, Reliant on assistive device for balance Standing balance-Leahy Scale: Poor Standing balance comment: reliant on RW     Communication Communication Communication: No apparent difficulties  Cognition Arousal: Alert Behavior During Therapy: Flat affect   PT - Cognitive impairments: No apparent impairments    Following commands: Intact Following commands impaired: Follows one step commands with increased time    Cueing Cueing Techniques: Verbal cues, Tactile cues  Exercises General Exercises - Lower Extremity Ankle Circles/Pumps: AROM, Both, 10 reps, Supine Quad Sets: AROM, Both, 10 reps, Supine        Pertinent Vitals/Pain Pain Assessment Pain Assessment: Faces Faces Pain Scale: Hurts worst Pain Location: abdomen Pain Descriptors / Indicators: Grimacing,  Guarding, Discomfort Pain Intervention(s): Limited activity within patient's tolerance, Monitored during session, Premedicated  before session, Repositioned     PT Goals (current goals can now be found in the care plan section) Acute Rehab PT Goals PT Goal Formulation: With patient Time For Goal Achievement: 06/15/23 Potential to Achieve Goals: Good Progress towards PT goals: Progressing toward goals    Frequency    Min 3X/week       AM-PAC PT "6 Clicks" Mobility   Outcome Measure  Help needed turning from your back to your side while in a flat bed without using bedrails?: A Little Help needed moving from lying on your back to sitting on the side of a flat bed without using bedrails?: A Lot Help needed moving to and from a bed to a chair (including a wheelchair)?: A Lot Help needed standing up from a chair using your arms (e.g., wheelchair or bedside chair)?: A Lot Help needed to walk in hospital room?: Total Help needed climbing 3-5 steps with a railing? : Total 6 Click Score: 11    End of Session Equipment Utilized During Treatment: Gait belt Activity Tolerance: Patient limited by pain Patient left: in chair;with call bell/phone within reach;with nursing/sitter in room Nurse Communication: Mobility status PT Visit Diagnosis: Other abnormalities of gait and mobility (R26.89);Pain Pain - part of body:  (abdomen)     Time: 1610-9604 PT Time Calculation (min) (ACUTE ONLY): 18 min  Charges:    $Therapeutic Activity: 8-22 mins PT General Charges $$ ACUTE PT VISIT: 1 Visit                     Bradley Torres, PT, DPT Secure Chat Preferred  Rehab Office [phone]   Bradley Torres Aliment 06/04/2023, 4:50 PM

## 2023-06-04 NOTE — Plan of Care (Signed)
  Problem: Fluid Volume: Goal: Ability to maintain a balanced intake and output will improve Outcome: Progressing   Problem: Nutritional: Goal: Maintenance of adequate nutrition will improve Outcome: Progressing   Problem: Safety: Goal: Ability to remain free from injury will improve Outcome: Progressing   Problem: Skin Integrity: Goal: Risk for impaired skin integrity will decrease Outcome: Progressing   Problem: Coping: Goal: Ability to adjust to condition or change in health will improve Outcome: Not Progressing   Problem: Nutritional: Goal: Progress toward achieving an optimal weight will improve Outcome: Not Progressing   Problem: Coping: Goal: Level of anxiety will decrease Outcome: Not Progressing

## 2023-06-04 NOTE — Progress Notes (Addendum)
 4 Days Post-Op   Subjective/Chief Complaint: Sore oob yesterday, no n/v   Objective: Vital signs in last 24 hours: Temp:  [97.6 F (36.4 C)-97.8 F (36.6 C)] 97.6 F (36.4 C) (03/09 0723) Pulse Rate:  [79-90] 83 (03/09 0723) Resp:  [14-21] 14 (03/09 0723) BP: (130-176)/(62-83) 153/81 (03/09 0723) SpO2:  [98 %-100 %] 100 % (03/09 0723) Weight:  [154.4 kg] 154.4 kg (03/09 0500) Last BM Date : 06/03/23  Intake/Output from previous day: 03/08 0701 - 03/09 0700 In: 3542.3 [P.O.:477; I.V.:2572.9; IV Piggyback:492.4] Out: 1910 [Urine:1840; Drains:70] Intake/Output this shift: No intake/output data recorded.  General nad Cv regular Pulm effort normal Ab soft approp tender vac in place jp with murky drainage, ostomy functional with air in bag, left lateral wound dressing intact  Lab Results:  Recent Labs    06/02/23 1450 06/03/23 0558  WBC 21.1* 19.3*  HGB 8.2* 7.8*  HCT 25.2* 23.6*  PLT 264 295   BMET Recent Labs    06/02/23 0415 06/03/23 0824  NA 133* 136  K 4.1 4.3  CL 103 107  CO2 24 20*  GLUCOSE 195* 178*  BUN 30* 29*  CREATININE 1.04 1.09  CALCIUM 8.6* 8.8*   PT/INR No results for input(s): "LABPROT", "INR" in the last 72 hours. ABG No results for input(s): "PHART", "HCO3" in the last 72 hours.  Invalid input(s): "PCO2", "PO2"  Studies/Results: [place] EKG SITE RITE Result Date: 06/04/2023 If Site Rite image not attached, placement could not be confirmed due to current cardiac rhythm.   Anti-infectives: Anti-infectives (From admission, onward)    Start     Dose/Rate Route Frequency Ordered Stop   06/03/23 2200  metroNIDAZOLE (FLAGYL) IVPB 500 mg       Placed in "Or" Linked Group   500 mg 100 mL/hr over 60 Minutes Intravenous Every 12 hours 06/03/23 1532     06/03/23 2200  metroNIDAZOLE (FLAGYL) tablet 500 mg       Placed in "Or" Linked Group   500 mg Oral Every 12 hours 06/03/23 1532     06/01/23 1000  metroNIDAZOLE (FLAGYL) IVPB 500 mg  Status:   Discontinued        500 mg 100 mL/hr over 60 Minutes Intravenous Every 12 hours 05/31/23 2343 06/01/23 0103   06/01/23 0115  metroNIDAZOLE (FLAGYL) IVPB 500 mg  Status:  Discontinued        500 mg 100 mL/hr over 60 Minutes Intravenous Every 12 hours 06/01/23 0103 06/03/23 1532   05/31/23 1140  sodium chloride 0.9 % with cefoTEtan (CEFOTAN) ADS Med       Note to Pharmacy: Clovis Cao M: cabinet override      05/31/23 1140 05/31/23 1350   05/31/23 1115  cefoTEtan (CEFOTAN) 2 g in sodium chloride 0.9 % 100 mL IVPB        2 g 200 mL/hr over 30 Minutes Intravenous On call to O.R. 05/31/23 1020 05/31/23 1400   05/27/23 1000  metroNIDAZOLE (FLAGYL) tablet 500 mg  Status:  Discontinued        500 mg Oral Every 12 hours 05/27/23 0722 05/31/23 2343   05/23/23 2315  cefTRIAXone (ROCEPHIN) 2 g in sodium chloride 0.9 % 100 mL IVPB        2 g 200 mL/hr over 30 Minutes Intravenous Every 24 hours 05/23/23 2305     05/23/23 2315  metroNIDAZOLE (FLAGYL) IVPB 500 mg  Status:  Discontinued        500 mg 100 mL/hr over 60  Minutes Intravenous Every 12 hours 05/23/23 2305 05/27/23 0981       Assessment/Plan: POD 4 elap, SBR, left colectomy, transverse colostomy, debridement ab wall by Dr. 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 Will need ostomy clinic referral at d/c - Cont Vac, plan changes M/Th - Cont BID WTD to left abdominal wound - check hct today then consider heparin again - Mobilize, PT - Pulm toilet, IS   FEN - fulls, half tpn VTE - SCDs, as above ID - Rocephin/Flagyl 4/5, will need to monitor wbc and drain Foley - out  -can restart all home meds now PO except for DOAC Needs to be oob as written for in orders  Bradley Torres 06/04/2023

## 2023-06-04 NOTE — Progress Notes (Signed)
 PHARMACY - ANTICOAGULATION CONSULT NOTE  Pharmacy Consult for heparin initiation Indication: hx pulmonary embolus  Allergies  Allergen Reactions   Aldactone [Spironolactone] Hives   Bystolic [Nebivolol Hcl] Swelling   Motrin [Ibuprofen] Other (See Comments)    Rectal bleed   Actos [Pioglitazone] Other (See Comments) and Cough    Dyspnea  Edema   Trulicity [Dulaglutide] Other (See Comments) and Cough    Shortness of breath GI intolerance   Zestril [Lisinopril] Other (See Comments) and Cough    Wheezing    Cozaar [Losartan] Rash   Diflucan [Fluconazole] Rash   Hctz [Hydrochlorothiazide] Other (See Comments)    Gout flares.   Norvasc [Amlodipine] Itching and Other (See Comments)    Myalgias     Patient Measurements: Height: 6' (182.9 cm) Weight: (!) 154.4 kg (340 lb 6.2 oz) (3 E bed, unable to stand this AM) IBW/kg (Calculated) : 77.6 Heparin Dosing Weight: HEPARIN DW (KG): 112.6   Vital Signs: Temp: 97.6 F (36.4 C) (03/09 0723) Temp Source: Oral (03/09 0723) BP: 153/81 (03/09 0723) Pulse Rate: 83 (03/09 0723)  Labs: Recent Labs    06/02/23 0415 06/02/23 1450 06/03/23 0032 06/03/23 0558 06/03/23 0824 06/04/23 1107  HGB 8.2* 8.2*  --  7.8*  --  8.9*  HCT 25.2* 25.2*  --  23.6*  --  27.7*  PLT 271 264  --  295  --  374  HEPARINUNFRC  --   --  0.33  --   --   --   CREATININE 1.04  --   --   --  1.09  --     Estimated Creatinine Clearance: 109 mL/min (by C-G formula based on SCr of 1.09 mg/dL).   Medical History: Past Medical History:  Diagnosis Date   CAD (coronary artery disease)    a. 12/2017: cath showing 10% Proximal-LAD stenosis with no significant obstructive disease and LVEDP mildly elevated at 18 mm Hg.    Chronic back pain    Diabetes mellitus, type 2 (HCC)    DVT (deep venous thrombosis) (HCC)    Essential hypertension    Family hx of colon cancer 05/03/2017   MI (myocardial infarction) (HCC) 2019   Morbid obesity (HCC)    Panic disorder     Shoulder pain, left    Following fall   Sleep apnea    Stroke (HCC) 01/2018   Vitamin D deficiency     Medications:  Scheduled:   acetaminophen  1,000 mg Oral Q6H   Chlorhexidine Gluconate Cloth  6 each Topical Daily   heparin injection (subcutaneous)  5,000 Units Subcutaneous Q8H   insulin aspart  0-20 Units Subcutaneous Q4H   methocarbamol (ROBAXIN) injection  1,000 mg Intravenous Q8H   Or   methocarbamol  1,000 mg Oral Q8H   metroNIDAZOLE  500 mg Oral Q12H   pantoprazole  40 mg Oral Daily    Assessment: Patient is a 63 yo M on rivaroxaban PTA for afib and hx DVT/PE. Patient was on heparin infusion 2/26 -3/3; then held for procedure 3/5. Now Surgery is ok with restarting with no bolus and Pharmacy was consulted for heparin.   Patient was last therapeutic on 2700-2750 units/hr. CBC ok - Hgb 7-8s, PLTc WNL   Goal of Therapy:  Heparin level 0.3-0.7 units/ml Monitor platelets by anticoagulation protocol: Yes   Plan:  No bolus per MD  Heparin 2750 units/hr  Start heparin infusion at 1800  units/hr Check anti-Xa level in 6 hours and daily while on heparin  Continue to monitor H&H and platelets  Deklen Popelka 06/04/2023,3:04 PM

## 2023-06-04 NOTE — Progress Notes (Signed)
 Peripherally Inserted Central Catheter Placement  The IV Nurse has discussed with the patient and/or persons authorized to consent for the patient, the purpose of this procedure and the potential benefits and risks involved with this procedure.  The benefits include less needle sticks, lab draws from the catheter, and the patient may be discharged home with the catheter. Risks include, but not limited to, infection, bleeding, blood clot (thrombus formation), and puncture of an artery; nerve damage and irregular heartbeat and possibility to perform a PICC exchange if needed/ordered by physician.  Alternatives to this procedure were also discussed.  Bard Power PICC patient education guide, fact sheet on infection prevention and patient information card has been provided to patient /or left at bedside.  PICC exchanged by Burnard Bunting RN  PICC Placement Documentation  PICC Triple Lumen 06/04/23 Right Basilic 47 cm 2 cm (Active)  Indication for Insertion or Continuance of Line Vasoactive infusions;Administration of hyperosmolar/irritating solutions (i.e. TPN, Vancomycin, etc.);Limited venous access - need for IV therapy >5 days (PICC only) 06/04/23 1726  Exposed Catheter (cm) 2 cm 06/04/23 1726  Site Assessment Clean, Dry, Intact 06/04/23 1726  Lumen #1 Status Flushed;Saline locked;Blood return noted 06/04/23 1726  Lumen #2 Status Flushed;Saline locked;Blood return noted 06/04/23 1726  Lumen #3 Status Flushed;Saline locked;Blood return noted 06/04/23 1726  Dressing Type Transparent;Securing device 06/04/23 1726  Dressing Status Antimicrobial disc/dressing in place;Clean, Dry, Intact 06/04/23 1726  Line Care Connections checked and tightened 06/04/23 1726  Line Adjustment (NICU/IV Team Only) No 06/04/23 1726  Dressing Intervention New dressing;Adhesive placed at insertion site (IV team only);Adhesive placed around edges of dressing (IV team/ICU RN only) 06/04/23 1726  Dressing Change Due 06/11/23  06/04/23 1726       Bradley Torres 06/04/2023, 5:28 PM

## 2023-06-04 NOTE — Progress Notes (Signed)
 PROGRESS NOTE    Bradley Torres  ZOX:[id number] DOB: 03-Mar-1961 DOA: 05/23/2023 PCP: Mechele Claude, MD     Brief Narrative:  63 year old male, with past medical history significant for morbid obesity, hypertension, type 2 diabetes mellitus, OSA with CPAP intolerance, history of DVT and PE on Xarelto, PAF, chronic HFpEF, and recent admission with LLQ intra-abdominal abscess, colocutaneous fistula, and abdominal wall abscess.  Patient presented to hospital with generalized weakness and malaise.  Of note patient had been admitted to the hospital 04/10/2023 and IR had a drain placed in the left lower quadrant abscess.  Patient was seen by ID and was discharged on Bactrim and Augmentin.  Patient developed malaise weakness lack of appetite and lightheadedness 3 to 4 days prior to admission.  On presentation to ED, patient was afebrile.  Labs revealed serum creatinine elevation at 2.9, and albumin of 2.6, and no leukocytosis.  Lactate was elevated at 3.1.  CT demonstrated percutaneous pigtail catheter in the left anterior abdominal wall with interval resolution/decrease in abscess at the catheter tip, but with persistent air-fluid collection along the catheter tract concerning for abdominal wall abscess measuring up to 6.1 cm.  Blood cultures were drawn and patient received 1 L of IV fluids.  General surgery was consulted and patient was admitted hospital for further evaluation and treatment.  Patient ultimately underwent ex lap with lysis of adhesion, left colectomy, small bowel resection with primary anastomosis, debridement of abdominal wall abscess, and transverse colostomy by Dr. Dwain Sarna on 3/5.  He was transferred to ICU on a ventilator postoperatively.  He was subsequently extubated and transferred to [facility] team 3/7.  06/04/2023: Patient seen alongside patient's nurse.  Patient continues to report nausea and abdominal pain.  Stat EKG revealed normal QTc interval.  Will start patient on Zofran as needed.   Will also use PPI.  Surgery team has cleared patient to restart heparin drip, but without bolus.  Assessment & Plan:   Principal Problem:   Colocutaneous fistula Active Problems:   Chronic heart failure with preserved ejection fraction (HFpEF) (HCC)   Severe obstructive sleep apnea-hypopnea syndrome   Sleep related hypoxia   Type 2 diabetes mellitus with hyperglycemia, without long-term current use of insulin (HCC)   Paroxysmal atrial fibrillation (HCC)   History of pulmonary embolus (PE)   Abdominal wall abscess   Acute renal failure superimposed on stage 3 chronic kidney disease, unspecified acute renal failure type, unspecified whether stage 3a or 3b CKD (HCC)   Malnutrition of moderate degree   Hypotension   Colocutaneous fistula, abdominal wall abscess Septic shock, postoperative, not present on admission -Status post ex lap, left colectomy, small bowel resection, debridement of abdominal wall abscess, transverse colostomy by Dr. Dwain Sarna 3/5 -Remains on TPN -NG tube removed -Wound VAC in place -JP drain in place -Colostomy -Ceftriaxone, Flagyl for 5 days -Leukocytosis is slowly improving.  WBC 17.7 today. -Postop care as Per general surgery team  AKI on CKD stage IIIa -Baseline creatinine 1.4-1.5 -Serum creatinine today is 1.09. -Resolved    Paroxysmal atrial fibrillation -Continue IV amiodarone until can take p.o. 06/04/2023: Restart heparin drip (without bolus)   Chronic HFpEF  -Remains stable   Hx of DVT/PE  -Holding Xarelto for now until can take p.o.  He was started on IV heparin yesterday --> on hold currently due to lower HCT.  Resume full anticoagulation once cleared from surgery team 06/04/2023: Hemoglobin of 8.9 g/dL today.  Surgery team is cleared patient to restart heparin drip.  Type II DM  -Sliding scale insulin  OSA -CPAP nightly   DVT prophylaxis: SCD heparin injection 5,000 Units Start: 06/03/23 0930 Place and maintain sequential  compression device Start: 06/02/23 1410  Code Status: Full Family Communication:  None at bedside Disposition Plan: Home Status is: Inpatient Remains inpatient appropriate because: Remains on TPN, IV antibiotics    Antimicrobials:  Anti-infectives (From admission, onward)    Start     Dose/Rate Route Frequency Ordered Stop   06/03/23 2200  metroNIDAZOLE (FLAGYL) IVPB 500 mg       Placed in "Or" Linked Group   500 mg 100 mL/hr over 60 Minutes Intravenous Every 12 hours 06/03/23 1532     06/03/23 2200  metroNIDAZOLE (FLAGYL) tablet 500 mg       Placed in "Or" Linked Group   500 mg Oral Every 12 hours 06/03/23 1532     06/01/23 1000  metroNIDAZOLE (FLAGYL) IVPB 500 mg  Status:  Discontinued        500 mg 100 mL/hr over 60 Minutes Intravenous Every 12 hours 05/31/23 2343 06/01/23 0103   06/01/23 0115  metroNIDAZOLE (FLAGYL) IVPB 500 mg  Status:  Discontinued        500 mg 100 mL/hr over 60 Minutes Intravenous Every 12 hours 06/01/23 0103 06/03/23 1532   05/31/23 1140  sodium chloride 0.9 % with cefoTEtan (CEFOTAN) ADS Med       Note to Pharmacy: Clovis Cao M: cabinet override      05/31/23 1140 05/31/23 1350   05/31/23 1115  cefoTEtan (CEFOTAN) 2 g in sodium chloride 0.9 % 100 mL IVPB        2 g 200 mL/hr over 30 Minutes Intravenous On call to O.R. 05/31/23 1020 05/31/23 1400   05/27/23 1000  metroNIDAZOLE (FLAGYL) tablet 500 mg  Status:  Discontinued        500 mg Oral Every 12 hours 05/27/23 0722 05/31/23 2343   05/23/23 2315  cefTRIAXone (ROCEPHIN) 2 g in sodium chloride 0.9 % 100 mL IVPB        2 g 200 mL/hr over 30 Minutes Intravenous Every 24 hours 05/23/23 2305     05/23/23 2315  metroNIDAZOLE (FLAGYL) IVPB 500 mg  Status:  Discontinued        500 mg 100 mL/hr over 60 Minutes Intravenous Every 12 hours 05/23/23 2305 05/27/23 0722        Objective: Vitals:   06/04/23 0300 06/04/23 0419 06/04/23 0500 06/04/23 0723  BP:  (!) 149/77  (!) 153/81  Pulse: 87 84  83   Resp: (!) 21 19  14   Temp:  97.7 F (36.5 C)  97.6 F (36.4 C)  TempSrc:  Oral  Oral  SpO2: 100% 100%  100%  Weight:   (!) 154.4 kg   Height:        Intake/Output Summary (Last 24 hours) at 06/04/2023 1215 Last data filed at 06/04/2023 0853 Gross per 24 hour  Intake 2666.09 ml  Output 1575 ml  Net 1091.09 ml   Filed Weights   06/02/23 0500 06/03/23 0431 06/04/23 0500  Weight: (!) 151.1 kg (!) 152.4 kg (!) 154.4 kg    Examination:  General exam: Appears calm and comfortable.  Patient is morbidly obese. Respiratory system: Clear to auscultation.  Cardiovascular system: S1 & S2 heard Gastrointestinal system: Abdomen is obese,  + JP drain, colostomy, wound VAC in place Central nervous system: Alert and oriented Extremities: Fullness of the ankle  Data Reviewed: I have personally  reviewed following labs and imaging studies  CBC: Recent Labs  Lab 06/01/23 0424 06/02/23 0415 06/02/23 1450 06/03/23 0558 06/04/23 1107  WBC 24.8* 22.3* 21.1* 19.3* 17.7*  HGB 9.1* 8.2* 8.2* 7.8* 8.9*  HCT 27.2* 25.2* 25.2* 23.6* 27.7*  MCV 93.5 95.5 96.6 98.7 96.2  PLT 315 271 264 295 374   Basic Metabolic Panel: Recent Labs  Lab 05/29/23 0338 05/31/23 1214 05/31/23 1239 05/31/23 1414 05/31/23 1533 05/31/23 1558 05/31/23 1751 06/01/23 0424 06/02/23 0415 06/03/23 0824  NA 133*   < > 133* 132*   < > 136 134* 130* 133* 136  K 4.1   < > 4.6 4.7   < > 4.8 4.9 4.3 4.1 4.3  CL 99   < > 104 103  --   --   --  102 103 107  CO2 26  --   --   --   --   --   --  23 24 20*  GLUCOSE 161*   < > 259* 246*  --   --   --  257* 195* 178*  BUN 34*   < > 36* 38*  --   --   --  41* 30* 29*  CREATININE 1.12   < > 1.10 1.10  --   --   --  1.20 1.04 1.09  CALCIUM 9.3  --   --   --   --   --   --  8.3* 8.6* 8.8*  MG 1.8  --   --   --   --   --   --  1.5* 1.8 1.9  PHOS 2.7  --   --   --   --   --   --  2.5 3.0 3.6   < > = values in this interval not displayed.   GFR: Estimated Creatinine Clearance:  109 mL/min (by C-G formula based on SCr of 1.09 mg/dL). Liver Function Tests: Recent Labs  Lab 05/29/23 0338 06/01/23 0424  AST 18 15  ALT 15 14  ALKPHOS 43 32*  BILITOT 0.2 0.4  PROT 6.2* 5.8*  ALBUMIN 2.4* 2.6*   No results for input(s): "LIPASE", "AMYLASE" in the last 168 hours. No results for input(s): "AMMONIA" in the last 168 hours. Coagulation Profile: No results for input(s): "INR", "PROTIME" in the last 168 hours. Cardiac Enzymes: No results for input(s): "CKTOTAL", "CKMB", "CKMBINDEX", "TROPONINI" in the last 168 hours. BNP (last 3 results) No results for input(s): "PROBNP" in the last 8760 hours. HbA1C: No results for input(s): "HGBA1C" in the last 72 hours. CBG: Recent Labs  Lab 06/03/23 1959 06/03/23 2356 06/04/23 0424 06/04/23 0821 06/04/23 1142  GLUCAP 163* 163* 145* 160* 158*   Lipid Profile: No results for input(s): "CHOL", "HDL", "LDLCALC", "TRIG", "CHOLHDL", "LDLDIRECT" in the last 72 hours. Thyroid Function Tests: No results for input(s): "TSH", "T4TOTAL", "FREET4", "T3FREE", "THYROIDAB" in the last 72 hours. Anemia Panel: No results for input(s): "VITAMINB12", "FOLATE", "FERRITIN", "TIBC", "IRON", "RETICCTPCT" in the last 72 hours. Sepsis Labs: No results for input(s): "PROCALCITON", "LATICACIDVEN" in the last 168 hours.  Recent Results (from the past 240 hours)  MRSA Next Gen by PCR, Nasal     Status: None   Collection Time: 05/31/23  6:08 PM   Specimen: Nasal Mucosa; Nasal Swab  Result Value Ref Range Status   MRSA by PCR Next Gen NOT DETECTED NOT DETECTED Final    Comment: (NOTE) The GeneXpert MRSA Assay (FDA approved for NASAL specimens only), is  one component of a comprehensive MRSA colonization surveillance program. It is not intended to diagnose MRSA infection nor to guide or monitor treatment for MRSA infections. Test performance is not FDA approved in patients less than 69 years old. Performed at [facility], [address], [place], [place] [address]       Radiology Studies: [place] EKG SITE RITE Result Date: 06/04/2023 If Site Rite image not attached, placement could not be confirmed due to current cardiac rhythm.      Scheduled Meds:  acetaminophen  1,000 mg Oral Q6H   Chlorhexidine Gluconate Cloth  6 each Topical Daily   heparin injection (subcutaneous)  5,000 Units Subcutaneous Q8H   insulin aspart  0-20 Units Subcutaneous Q4H   methocarbamol (ROBAXIN) injection  1,000 mg Intravenous Q8H   Or   methocarbamol  1,000 mg Oral Q8H   metroNIDAZOLE  500 mg Oral Q12H   pantoprazole (PROTONIX) IV  40 mg Intravenous Daily   Continuous Infusions:  amiodarone 30 mg/hr (06/04/23 0924)   cefTRIAXone (ROCEPHIN)  IV 2 g (06/03/23 2342)   liver oil-zinc oxide     metronidazole 500 mg (06/03/23 2245)   TPN ADULT (ION) 110 mL/hr at 06/03/23 1800   TPN ADULT (ION)       LOS: 12 days   Time spent: 35 minutes   Barnetta Chapel, MD [facility] 06/04/2023, 12:15 PM   Available via Epic secure chat 7am-7pm After these hours, please refer to coverage provider listed on [email or web address]

## 2023-06-04 NOTE — Progress Notes (Signed)
 PT Cancellation Note  Patient Details Name: Bradley Torres MRN: [id number] DOB: 06-Jun-1960   Cancelled Treatment:    Reason Eval/Treat Not Completed: Pt reported having nausea and was about to take medication. Pt declined mobility at this time with Acute PT to re-attempt as time allows.)  Hilton Cork, PT, DPT Secure Chat Preferred  Rehab Office [phone]  Arturo Morton Brion Aliment 06/04/2023, 1:39 PM

## 2023-06-04 NOTE — Progress Notes (Signed)
 PHARMACY - TOTAL PARENTERAL NUTRITION CONSULT NOTE  Indication:  chronic malnutrition chronic colocutaneous fistula/ subcutaneous abscess   Patient Measurements: Height: 6' (182.9 cm) Weight: (!) 154.4 kg (340 lb 6.2 oz) (3 E bed, unable to stand this AM) IBW/kg (Calculated) : 77.6 TPN AdjBW (KG): 95.5 Body mass index is 46.17 kg/m. Usual Weight: 152-155 kg (fluctuating over the past 6 months; 170 kg 04/2022) 2/26 standing weight 134 kg (confirmed with RN and patient) 3/01 standing wt =150.2kg per RN   Assessment:  78 YOM with with PMH significant for HTN, DM2, DVT/PE on Xarelto, PAF, HFpEF with a recent admission for LLQ IA abscess, colocutaneous fistula, and abdominal wall abscess on 04/27/2023, had IR drain placed into the LLQ abscess and the IA abscess aspirated on 1/31. He returned to the ED with concerns that the drain in his abdomen was not functioning and now is in need of surgery with a possible colostomy.   Per chart review, this has been going on for 8 months resulting in about 30 lbs weight loss. Of note, bed weights are significantly higher than standing weights. Per surgery, allow patient to eat freely by mouth. Patient has been eating 100% of eggs, sausage, cereal, toast, orange juice and coffee for breakfast and spaghetti, [place] and gravy, green beans, creamed potatoes for lunch/dinner. Pharmacy consulted to manage TPN to optimize nutrition prior to surgery.   Transitioned patient to compounded TPN 3/5 given that NPO for procedure and will likely have reduced PO intake post-op. Clinimix (no electrolytes) only met approximately 73% of kcal requirements.  Glucose / Insulin: A1c 6.5% on Farxiga, Januvia and metformin PTA CBGs improving Used 24 units SSI in the past 24 hrs, 50 units regular insulin in TPN Electrolytes: 3/8 labs - Na up to 136, low CO2, others WNL (Phos trending up) Renal: SCr 1.09 (BL ~1.5), BUN down to 29 Hepatic: LFTs / tbili  / TG WNL, prealbumin up 15 >  22 Intake / Output; MIVF: UOP 0.5 ml/kg/hr, drain 70mL, LBM 3/4 GI Imaging:  2/25 CT: previously identified IA abscess dec with drain, air-fluid collection in tissue concerning for abd wall abscess  GI Surgeries / Procedures:  3/5 ex-lap with LOA, left colectomy, SBR  Central access: PICC placed 05/25/23 TPN start date: 05/25/23   Nutritional Goals: RD Estimated Needs Total Energy Estimated Needs: 2400-2600 kcal Total Protein Estimated Needs: 160-180 gm Total Fluid Estimated Needs: >2L/day Goal TPN: 110 ml/hr to provide 161g AA and 2411 kCal, meeting 100% of nutritional needs.  Current Nutrition:  TPN (lytes added 3/7, on Clinimix without lytes prior) 2/27 FLD - ate 100% 2/28 heart diet - eating 100% of meals  3/5 NPO for surgery 3/8 CLD 3/9 FLD  Plan:  Advance to FLD and half TPN per Surgery - TPN at 55 ml/hr at 1800  Electrolytes in TPN: Na 130 mEq/L, Ca 5 mEq/L, double K to 46mEq/L, Mag to 12 mEq/L, Phos to 15 mmol/L with half TPN rate, Cl:Ac 1:1 Daily multivitamin and trace elements in TPN Continue resistant SSI Q4H and reduce to 25 units insulin in TPN Monitor TPN labs on Mon/Thurs F/u PO intake/diet advancement to wean off of TPN  Jacobo Moncrief D. Laney Potash, PharmD, BCPS, BCCCP 06/04/2023, 10:04 AM

## 2023-06-05 DIAGNOSIS — K632 Fistula of intestine: Secondary | ICD-10-CM | POA: Diagnosis not present

## 2023-06-05 LAB — GLUCOSE, CAPILLARY
Glucose-Capillary: 118 mg/dL — ABNORMAL HIGH (ref 70–99)
Glucose-Capillary: 128 mg/dL — ABNORMAL HIGH (ref 70–99)
Glucose-Capillary: 135 mg/dL — ABNORMAL HIGH (ref 70–99)
Glucose-Capillary: 140 mg/dL — ABNORMAL HIGH (ref 70–99)
Glucose-Capillary: 142 mg/dL — ABNORMAL HIGH (ref 70–99)
Glucose-Capillary: 147 mg/dL — ABNORMAL HIGH (ref 70–99)
Glucose-Capillary: 151 mg/dL — ABNORMAL HIGH (ref 70–99)

## 2023-06-05 LAB — BASIC METABOLIC PANEL
Anion gap: 10 (ref 5–15)
BUN: 28 mg/dL — ABNORMAL HIGH (ref 8–23)
CO2: 22 mmol/L (ref 22–32)
Calcium: 8.9 mg/dL (ref 8.9–10.3)
Chloride: 106 mmol/L (ref 98–111)
Creatinine, Ser: 1.08 mg/dL (ref 0.61–1.24)
GFR, Estimated: >60 mL/min (ref >60)
Glucose, Bld: 153 mg/dL — ABNORMAL HIGH (ref 70–99)
Potassium: 4.1 mmol/L (ref 3.5–5.1)
Sodium: 138 mmol/L (ref 135–145)

## 2023-06-05 LAB — CBC WITH DIFFERENTIAL/PLATELET
Abs Immature Granulocytes: 0.8 10*3/uL — ABNORMAL HIGH (ref 0.00–0.07)
Basophils Absolute: 0.1 10*3/uL (ref 0.0–0.1)
Basophils Relative: 1 %
Eosinophils Absolute: 0.5 10*3/uL (ref 0.0–0.5)
Eosinophils Relative: 4 %
HCT: 24 % — ABNORMAL LOW (ref 39.0–52.0)
Hemoglobin: 7.9 g/dL — ABNORMAL LOW (ref 13.0–17.0)
Immature Granulocytes: 6 %
Lymphocytes Relative: 6 %
Lymphs Abs: 0.8 10*3/uL (ref 0.7–4.0)
MCH: 33.1 pg (ref 26.0–34.0)
MCHC: 32.9 g/dL (ref 30.0–36.0)
MCV: 100.4 fL — ABNORMAL HIGH (ref 80.0–100.0)
Monocytes Absolute: 0.8 10*3/uL (ref 0.1–1.0)
Monocytes Relative: 6 %
Neutro Abs: 11.2 10*3/uL — ABNORMAL HIGH (ref 1.7–7.7)
Neutrophils Relative %: 77 %
Platelets: 303 10*3/uL (ref 150–400)
RBC: 2.39 MIL/uL — ABNORMAL LOW (ref 4.22–5.81)
RDW: 17.7 % — ABNORMAL HIGH (ref 11.5–15.5)
Smear Review: ADEQUATE
WBC: 14.3 10*3/uL — ABNORMAL HIGH (ref 4.0–10.5)
nRBC: 0 % (ref 0.0–0.2)

## 2023-06-05 LAB — HEPARIN LEVEL (UNFRACTIONATED): Heparin Unfractionated: 0.67 [IU]/mL (ref 0.30–0.70)

## 2023-06-05 LAB — TRIGLYCERIDES
Triglycerides: 531 mg/dL — ABNORMAL HIGH (ref <150)
Triglycerides: 45 mg/dL (ref <150)

## 2023-06-05 LAB — SURGICAL PATHOLOGY

## 2023-06-05 LAB — PHOSPHORUS: Phosphorus: 4 mg/dL (ref 2.5–4.6)

## 2023-06-05 LAB — MAGNESIUM: Magnesium: 1.8 mg/dL (ref 1.7–2.4)

## 2023-06-05 MED ORDER — ENSURE ENLIVE PO LIQD
237.0000 mL | Freq: Three times a day (TID) | ORAL | Status: DC
  Administered 2023-06-05: 237 mL via ORAL

## 2023-06-05 MED ORDER — ADULT MULTIVITAMIN W/MINERALS CH
1.0000 | ORAL_TABLET | Freq: Every day | ORAL | Status: DC
  Administered 2023-06-06 – 2023-06-10 (×5): 1 via ORAL
  Filled 2023-06-05 (×5): qty 1

## 2023-06-05 MED ORDER — OXYCODONE-ACETAMINOPHEN 5-325 MG PO TABS
1.0000 | ORAL_TABLET | ORAL | Status: DC | PRN
  Administered 2023-06-05 – 2023-06-07 (×7): 1 via ORAL
  Filled 2023-06-05 (×7): qty 1

## 2023-06-05 MED ORDER — OXYCODONE HCL 5 MG PO TABS
5.0000 mg | ORAL_TABLET | ORAL | Status: DC | PRN
  Administered 2023-06-05 – 2023-06-07 (×7): 5 mg via ORAL
  Filled 2023-06-05 (×7): qty 1

## 2023-06-05 MED ORDER — INSULIN ASPART 100 UNIT/ML IJ SOLN
0.0000 [IU] | Freq: Three times a day (TID) | INTRAMUSCULAR | Status: DC
  Administered 2023-06-05: 4 [IU] via SUBCUTANEOUS
  Administered 2023-06-05 – 2023-06-07 (×6): 3 [IU] via SUBCUTANEOUS
  Administered 2023-06-08: 4 [IU] via SUBCUTANEOUS
  Administered 2023-06-08: 2 [IU] via SUBCUTANEOUS
  Administered 2023-06-09: 4 [IU] via SUBCUTANEOUS
  Administered 2023-06-09 – 2023-06-10 (×4): 3 [IU] via SUBCUTANEOUS

## 2023-06-05 MED ORDER — OXYCODONE-ACETAMINOPHEN 10-325 MG PO TABS: 1.0000 | ORAL_TABLET | ORAL | Status: DC | PRN

## 2023-06-05 MED ORDER — AMIODARONE HCL 200 MG PO TABS
200.0000 mg | ORAL_TABLET | Freq: Every day | ORAL | Status: DC
  Administered 2023-06-05 – 2023-06-10 (×6): 200 mg via ORAL
  Filled 2023-06-05 (×6): qty 1

## 2023-06-05 MED ORDER — LIDOCAINE 5 % EX PTCH
1.0000 | MEDICATED_PATCH | CUTANEOUS | Status: DC
  Filled 2023-06-05 (×4): qty 1

## 2023-06-05 MED ORDER — FENTANYL CITRATE PF 50 MCG/ML IJ SOSY
50.0000 ug | PREFILLED_SYRINGE | INTRAMUSCULAR | Status: DC | PRN
  Administered 2023-06-05 – 2023-06-06 (×2): 50 ug via INTRAVENOUS
  Filled 2023-06-05 (×2): qty 1

## 2023-06-05 MED ORDER — INSULIN ASPART 100 UNIT/ML IJ SOLN: 0.0000 [IU] | Freq: Every day | INTRAMUSCULAR | Status: DC

## 2023-06-05 MED ORDER — TRAVASOL 10 % IV SOLN
INTRAVENOUS | Status: AC
  Filled 2023-06-05: qty 805.2

## 2023-06-05 NOTE — Plan of Care (Signed)
  Problem: Education: Goal: Ability to describe self-care measures that may prevent or decrease complications (Diabetes Survival Skills Education) will improve Outcome: Progressing   Problem: Coping: Goal: Ability to adjust to condition or change in health will improve Outcome: Progressing   Problem: Fluid Volume: Goal: Ability to maintain a balanced intake and output will improve Outcome: Progressing   Problem: Health Behavior/Discharge Planning: Goal: Ability to identify and utilize available resources and services will improve Outcome: Progressing Goal: Ability to manage health-related needs will improve Outcome: Progressing   Problem: Metabolic: Goal: Ability to maintain appropriate glucose levels will improve Outcome: Progressing   Problem: Nutritional: Goal: Maintenance of adequate nutrition will improve Outcome: Progressing Goal: Progress toward achieving an optimal weight will improve Outcome: Progressing   Problem: Skin Integrity: Goal: Risk for impaired skin integrity will decrease Outcome: Progressing   Problem: Education: Goal: Knowledge of General Education information will improve Description: Including pain rating scale, medication(s)/side effects and non-pharmacologic comfort measures Outcome: Progressing   Problem: Health Behavior/Discharge Planning: Goal: Ability to manage health-related needs will improve Outcome: Progressing   Problem: Clinical Measurements: Goal: Ability to maintain clinical measurements within normal limits will improve Outcome: Progressing Goal: Will remain free from infection Outcome: Progressing Goal: Diagnostic test results will improve Outcome: Progressing Goal: Respiratory complications will improve Outcome: Progressing   Problem: Activity: Goal: Risk for activity intolerance will decrease Outcome: Progressing   Problem: Nutrition: Goal: Adequate nutrition will be maintained Outcome: Progressing   Problem:  Coping: Goal: Level of anxiety will decrease Outcome: Progressing   Problem: Elimination: Goal: Will not experience complications related to bowel motility Outcome: Progressing Goal: Will not experience complications related to urinary retention Outcome: Progressing   Problem: Pain Managment: Goal: General experience of comfort will improve and/or be controlled Outcome: Progressing   Problem: Safety: Goal: Ability to remain free from injury will improve Outcome: Progressing   Problem: Skin Integrity: Goal: Risk for impaired skin integrity will decrease Outcome: Progressing

## 2023-06-05 NOTE — Progress Notes (Signed)
 Occupational Therapy Treatment Patient Details Name: Bradley Torres MRN: [id number] DOB: [birth year] Today's Date: 06/05/2023   History of present illness Pt is a 63 y.o. male admitted 2/25 with acute renal failure and chronic colocutaneous fistula. He underwent L colectomy and transverse colostomy 3/5. ICU stay from 3/5-3/8. PMH: HTN, DMII, morbid obesity, OSA with CPAP intolerance, h/o DVT and PE on Xarelto, PAF, chronic HFpEF   OT comments  Patient received in supine and agreeable to OT treatment and OOB. Patient demonstrating good gains with bed mobility and transfers. Performed adaptive equipment training with reacher, dressing stick and sock aide. Patient states he has a Sports administrator at home which he uses to assist with dressing. Performed doffing and donning socks with sock aide and mod/max assist. Patient will benefit from continued inpatient follow up therapy, <3 hours/day. Acute OT to continue to follow to address established goals to facilitate DC to next venue of care.        If plan is discharge home, recommend the following:  Two people to help with walking and/or transfers;Two people to help with bathing/dressing/bathroom;Assistance with cooking/housework;Assist for transportation;Help with stairs or ramp for entrance   Equipment Recommendations  None recommended by OT    Recommendations for Other Services      Precautions / Restrictions Precautions Precautions: Fall;Other (comment) (abdominal) Recall of Precautions/Restrictions: Intact Precaution/Restrictions Comments: abdominal wound vac, R JP drain Restrictions Weight Bearing Restrictions Per Provider Order: No       Mobility Bed Mobility Overal bed mobility: Needs Assistance Bed Mobility: Rolling, Sidelying to Sit Rolling: Contact guard assist, Used rails Sidelying to sit: Mod assist, HOB elevated       General bed mobility comments: pt pulled on therapist with LUE to complete EOB transfer    Transfers Overall  transfer level: Needs assistance Equipment used: Rolling walker (2 wheels) Transfers: Sit to/from Stand, Bed to chair/wheelchair/BSC Sit to Stand: Mod assist     Step pivot transfers: Mod assist     General transfer comment: required mod a from sit to stand from raised bed, assitance was walker management and balance. Cues for hand placement     Balance Overall balance assessment: Needs assistance Sitting-balance support: Feet supported Sitting balance-Leahy Scale: Fair Sitting balance - Comments: supervision sitting EOB Postural control: Posterior lean (minor) Standing balance support: Bilateral upper extremity supported, During functional activity, Reliant on assistive device for balance                               ADL either performed or assessed with clinical judgement   ADL Overall ADL's : Needs assistance/impaired Eating/Feeding: Set up   Grooming: Wash/dry face;Set up;Sitting               Lower Body Dressing: Moderate assistance;Maximal assistance;With adaptive equipment;Sitting/lateral leans Lower Body Dressing Details (indicate cue type and reason): education on AE for LB dressing with reacher, dressing stick, and sock aid Toilet Transfer: Moderate assistance;Rolling walker (2 wheels) Toilet Transfer Details (indicate cue type and reason): Simulated to recliner           General ADL Comments: Limited ADL participation due to pain. Patient educated on adaptive equipment for ADLs, sock aid, reacher and dressing stick.    Extremity/Trunk Assessment              Vision       Perception     Praxis     Communication Communication Communication: No apparent  difficulties   Cognition Arousal: Alert Behavior During Therapy: Flat affect Cognition: Cognition impaired             OT - Cognition Comments: increased time to follow commands                 Following commands: Impaired Following commands impaired: Follows one  step commands with increased time      Cueing   Cueing Techniques: Verbal cues, Tactile cues  Exercises      Shoulder Instructions       General Comments      Pertinent Vitals/ Pain       Pain Assessment Pain Assessment: Faces Faces Pain Scale: Hurts little more Pain Location: abdomen Pain Descriptors / Indicators: Grimacing, Guarding Pain Intervention(s): Repositioned, Monitored during session, Premedicated before session  Home Living                                          Prior Functioning/Environment              Frequency  Min 2X/week        Progress Toward Goals  OT Goals(current goals can now be found in the care plan section)  Progress towards OT goals: Progressing toward goals  Acute Rehab OT Goals OT Goal Formulation: With patient Time For Goal Achievement: 06/15/23 Potential to Achieve Goals: Good ADL Goals Pt Will Perform Grooming: with contact guard assist;standing Pt Will Perform Upper Body Dressing: with min assist;sitting Pt Will Perform Lower Body Dressing: with mod assist;with adaptive equipment;sit to/from stand Pt Will Transfer to Toilet: with contact guard assist;ambulating;bedside commode Additional ADL Goal #1: Pt will complete bed mobility using log roll technique with moderate assistance in preparation for ADLs.  Plan      Co-evaluation                 AM-PAC OT "6 Clicks" Daily Activity     Outcome Measure   Help from another person eating meals?: A Little Help from another person taking care of personal grooming?: A Little Help from another person toileting, which includes using toliet, bedpan, or urinal?: A Lot Help from another person bathing (including washing, rinsing, drying)?: A Lot Help from another person to put on and taking off regular upper body clothing?: A Lot Help from another person to put on and taking off regular lower body clothing?: Total 6 Click Score: 13    End of Session  Equipment Utilized During Treatment: Rolling walker (2 wheels);Gait belt  OT Visit Diagnosis: Unsteadiness on feet (R26.81);Other abnormalities of gait and mobility (R26.89);Muscle weakness (generalized) (M62.81);Pain;Other symptoms and signs involving cognitive function Pain - part of body:  (abdomin)   Activity Tolerance Patient tolerated treatment well   Patient Left in chair;with call bell/phone within reach   Nurse Communication Mobility status        Time: 2956-2130 OT Time Calculation (min): 24 min  Charges: OT General Charges $OT Visit: 1 Visit OT Treatments $Self Care/Home Management : 23-37 mins  Alfonse Flavors, OTA Acute Rehabilitation Services  Office [phone]   Dewain Penning 06/05/2023, 2:57 PM

## 2023-06-05 NOTE — Progress Notes (Signed)
 PHARMACY - ANTICOAGULATION CONSULT NOTE  Pharmacy Consult for heparin initiation Indication: hx pulmonary embolus  Allergies  Allergen Reactions   Aldactone [Spironolactone] Hives   Bystolic [Nebivolol Hcl] Swelling   Motrin [Ibuprofen] Other (See Comments)    Rectal bleed   Actos [Pioglitazone] Other (See Comments) and Cough    Dyspnea  Edema   Trulicity [Dulaglutide] Other (See Comments) and Cough    Shortness of breath GI intolerance   Zestril [Lisinopril] Other (See Comments) and Cough    Wheezing    Cozaar [Losartan] Rash   Diflucan [Fluconazole] Rash   Hctz [Hydrochlorothiazide] Other (See Comments)    Gout flares.   Norvasc [Amlodipine] Itching and Other (See Comments)    Myalgias     Patient Measurements: Height: 6' (182.9 cm) Weight: (!) 153.9 kg (339 lb 4.6 oz) IBW/kg (Calculated) : 77.6 Heparin Dosing Weight: HEPARIN DW (KG): 112.6   Vital Signs: Temp: 97.8 F (36.6 C) (03/10 0736) Temp Source: Oral (03/10 0736) BP: 156/87 (03/10 0736) Pulse Rate: 88 (03/10 0736)  Labs: Recent Labs    06/03/23 0032 06/03/23 0558 06/03/23 0824 06/04/23 1107 06/05/23 0922  HGB  --  7.8*  --  8.9* 7.9*  HCT  --  23.6*  --  27.7* 24.0*  PLT  --  295  --  374 303  HEPARINUNFRC 0.33  --   --   --  0.67  CREATININE  --   --  1.09  --   --     Estimated Creatinine Clearance: 108.8 mL/min (by C-G formula based on SCr of 1.09 mg/dL).   Medical History: Past Medical History:  Diagnosis Date   CAD (coronary artery disease)    a. 12/2017: cath showing 10% Proximal-LAD stenosis with no significant obstructive disease and LVEDP mildly elevated at 18 mm Hg.    Chronic back pain    Diabetes mellitus, type 2 (HCC)    DVT (deep venous thrombosis) (HCC)    Essential hypertension    Family hx of colon cancer 05/03/2017   MI (myocardial infarction) (HCC) 2019   Morbid obesity (HCC)    Panic disorder    Shoulder pain, left    Following fall   Sleep apnea    Stroke (HCC)  01/2018   Vitamin D deficiency     Medications:  Scheduled:   Chlorhexidine Gluconate Cloth  6 each Topical Daily   feeding supplement  237 mL Oral TID BM   insulin aspart  0-20 Units Subcutaneous Q4H   lidocaine  1 patch Transdermal Q24H   methocarbamol  1,000 mg Oral Q8H   metroNIDAZOLE  500 mg Oral Q12H   pantoprazole  40 mg Oral Daily    Assessment: Patient is a 63 yo M on rivaroxaban PTA for afib and hx DVT/PE. Patient was on heparin infusion 2/26 -3/3; then held for procedure 3/5. Now Surgery is ok with restarting with no bolus and Pharmacy was consulted for heparin.   Heparin level therapeutic at 0.67, CBC stable.  Goal of Therapy:  Heparin level 0.3-0.7 units/ml Monitor platelets by anticoagulation protocol: Yes   Plan:  Reduce heparin to 2700 units/h Daily heparin level and CBC  Fredonia Highland, PharmD, [place], [facility] Clinical Pharmacist [phone] Please check AMION for all [facility] Pharmacy numbers 06/05/2023

## 2023-06-05 NOTE — TOC Initial Note (Addendum)
 Transition of Care [facility]) - Initial/Assessment Note    Patient Details  Name: Bradley Torres MRN: [id number] Date of Birth: 04/24/1960  Transition of Care [facility]) CM/SW Contact:    Michaela Corner, LCSWA Phone Number: 06/05/2023, 3:43 PM  Clinical Narrative:   CSW met pt at bedside to discuss PT recs for SNF, pt is agreeable.  CSW will continue to follow for medical readiness for SNF workup as pt is on TPN.   TOC will continue to follow.    Expected Discharge Plan: Skilled Nursing Facility Barriers to Discharge: Continued Medical Work up   Patient Goals and CMS Choice Patient states their goals for this hospitalization and ongoing recovery are:: To get better          Expected Discharge Plan and Services In-house Referral: Clinical Social Work     Living arrangements for the past 2 months: Single Family Home                 DME Arranged: N/A                    Prior Living Arrangements/Services Living arrangements for the past 2 months: Single Family Home Lives with:: Self Patient language and need for interpreter reviewed:: Yes Do you feel safe going back to the place where you live?: Yes      Need for Family Participation in Patient Care: Yes (Comment) Care giver support system in place?: Yes (comment) Current home services: DME Criminal Activity/Legal Involvement Pertinent to Current Situation/Hospitalization: No - Comment as needed  Activities of Daily Living   ADL Screening (condition at time of admission) Independently performs ADLs?: Yes (appropriate for developmental age) Is the patient deaf or have difficulty hearing?: No Does the patient have difficulty seeing, even when wearing glasses/contacts?: No Does the patient have difficulty concentrating, remembering, or making decisions?: No  Permission Sought/Granted                  Emotional Assessment Appearance:: Appears stated age Attitude/Demeanor/Rapport: Engaged Affect (typically  observed): Pleasant Orientation: : Oriented to  Time, Oriented to Situation, Oriented to Place, Oriented to Self Alcohol / Substance Use: Not Applicable Psych Involvement: No (comment)  Admission diagnosis:  AKI (acute kidney injury) (HCC) [N17.9] Acute renal failure superimposed on stage 3 chronic kidney disease, unspecified acute renal failure type, unspecified whether stage 3a or 3b CKD (HCC) [N17.9, N18.30] Patient Active Problem List   Diagnosis Date Noted   Hypotension 05/31/2023   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   Malnutrition of moderate degree 05/26/2023   Acute renal failure superimposed on stage 3 chronic kidney disease, unspecified acute renal failure type, unspecified whether stage 3a or 3b CKD (HCC) 05/23/2023   Abdominal wall abscess 04/27/2023   Intra-abdominal abscess (HCC) 10/01/2022   Atrial fibrillation with RVR (HCC) 06/27/2022   Severe sepsis (HCC) 06/19/2022   Sigmoid diverticulitis 06/19/2022   QT prolongation 06/19/2022   Mixed hyperlipidemia 06/19/2022   Acute pulmonary embolism (HCC) 06/19/2022   History of DVT (deep vein thrombosis) 06/19/2022   Diverticulitis of large intestine with perforation and abscess 06/19/2022   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/23/2021   History of pulmonary embolus (PE) 10/24/2020   Paroxysmal atrial fibrillation (HCC) 12/31/2019   Vitamin D deficiency    Type 2 diabetes mellitus with hyperglycemia, without long-term current use of insulin (HCC)    Sleep related hypoxia 11/01/2019   Severe obstructive sleep apnea-hypopnea syndrome 08/01/2019   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  Arteriovenous malformation 08/01/2019   Primary osteoarthritis involving multiple joints 06/21/2019   Dysphagia 10/17/2018   Anticoagulated by anticoagulation treatment 03/06/2018   Hx of non-ST elevation myocardial infarction (NSTEMI) 03/06/2018   Chronic heart failure with preserved ejection fraction (HFpEF) (HCC)    Chronic pain syndrome     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 [phone] - [place], [name] - [address] AT [facility] [address] [address] [place] [address] Phone: [phone] Fax: [phone]  [facility] - [place], [place] - [place] [address] [place] [address] Phone: [phone] Fax: [phone]     Social Drivers of Health (SDOH) Social History: SDOH Screenings   Food Insecurity: No Food Insecurity (05/24/2023)  Housing: Low Risk  (05/24/2023)  Transportation Needs: No Transportation Needs (05/24/2023)  Utilities: Not At Risk (05/24/2023)  Depression (PHQ2-9): Low Risk  (02/21/2023)  Financial Resource Strain: Medium Risk (05/18/2020)  Social Connections: Unknown (01/16/2020)  Stress: Stress Concern Present (05/15/2019)  Tobacco Use: Low Risk  (05/31/2023)   SDOH Interventions:     Readmission Risk Interventions     No data to display

## 2023-06-05 NOTE — Progress Notes (Signed)
 PROGRESS NOTE    Bradley Torres  XBJ:[id number] DOB: 18-Apr-1960 DOA: 05/23/2023 PCP: Mechele Claude, MD     Brief Narrative:  63 year old male, with past medical history significant for morbid obesity, hypertension, type 2 diabetes mellitus, OSA with CPAP intolerance, history of DVT and PE on Xarelto, PAF, chronic HFpEF, and recent admission with LLQ intra-abdominal abscess, colocutaneous fistula, and abdominal wall abscess.  Patient presented to hospital with generalized weakness and malaise.  Of note patient had been admitted to the hospital 04/10/2023 and IR had a drain placed in the left lower quadrant abscess.  Patient was seen by ID and was discharged on Bactrim and Augmentin.  Patient developed malaise weakness lack of appetite and lightheadedness 3 to 4 days prior to admission.  On presentation to ED, patient was afebrile.  Labs revealed serum creatinine elevation at 2.9, and albumin of 2.6, and no leukocytosis.  Lactate was elevated at 3.1.  CT demonstrated percutaneous pigtail catheter in the left anterior abdominal wall with interval resolution/decrease in abscess at the catheter tip, but with persistent air-fluid collection along the catheter tract concerning for abdominal wall abscess measuring up to 6.1 cm.  Blood cultures were drawn and patient received 1 L of IV fluids.  General surgery was consulted and patient was admitted hospital for further evaluation and treatment.  Patient ultimately underwent ex lap with lysis of adhesion, left colectomy, small bowel resection with primary anastomosis, debridement of abdominal wall abscess, and transverse colostomy by Dr. Dwain Sarna on 3/5.  He was transferred to ICU on a ventilator postoperatively.  He was subsequently extubated and transferred to [facility] team 3/7.  06/04/2023: Patient seen alongside patient's nurse.  Patient continues to report nausea and abdominal pain.  Stat EKG revealed normal QTc interval.  Will start patient on Zofran as needed.   Will also use PPI.  Surgery team has cleared patient to restart heparin drip, but without bolus.  06/05/2023: Patient seen.  No nausea today.  Postop care as outlined by the surgical team.  Leukocytosis is resolving.  WBC today is 14.3.  Assessment & Plan:   Principal Problem:   Colocutaneous fistula Active Problems:   Chronic heart failure with preserved ejection fraction (HFpEF) (HCC)   Severe obstructive sleep apnea-hypopnea syndrome   Sleep related hypoxia   Type 2 diabetes mellitus with hyperglycemia, without long-term current use of insulin (HCC)   Paroxysmal atrial fibrillation (HCC)   History of pulmonary embolus (PE)   Abdominal wall abscess   Acute renal failure superimposed on stage 3 chronic kidney disease, unspecified acute renal failure type, unspecified whether stage 3a or 3b CKD (HCC)   Malnutrition of moderate degree   Hypotension   Colocutaneous fistula, abdominal wall abscess Septic shock, postoperative, not present on admission -Status post ex lap, left colectomy, small bowel resection, debridement of abdominal wall abscess, transverse colostomy by Dr. Dwain Sarna 3/5 -Remains on TPN -NG tube removed -Wound VAC in place -JP drain in place -Colostomy -Ceftriaxone, Flagyl for 5 days -Leukocytosis is slowly improving.  WBC 17.7 today. -Postop care as Per general surgery team  AKI on CKD stage IIIa -Baseline creatinine 1.4-1.5 -Serum creatinine today is 1.09. -AKI has resolved.  Serum creatinine of 1.08 today and estimated GFR of greater than 60 mL/min per 1.73 m.    Paroxysmal atrial fibrillation -Change IV amiodarone to oral today.     Chronic HFpEF  -Remains stable   Hx of DVT/PE  -Holding Xarelto for now until can take p.o.  He was  started on IV heparin yesterday --> on hold currently due to lower HCT.  Resume full anticoagulation once cleared from surgery team 06/05/2023: Continue heparin.     Type II DM  -Sliding scale insulin  OSA -CPAP  nightly   DVT prophylaxis: SCD Place and maintain sequential compression device Start: 06/02/23 1410  Code Status: Full Family Communication:  None at bedside Disposition Plan: Home Status is: Inpatient Remains inpatient appropriate because: Remains on TPN, IV antibiotics    Antimicrobials:  Anti-infectives (From admission, onward)    Start     Dose/Rate Route Frequency Ordered Stop   06/03/23 2200  metroNIDAZOLE (FLAGYL) IVPB 500 mg       Placed in "Or" Linked Group   500 mg 100 mL/hr over 60 Minutes Intravenous Every 12 hours 06/03/23 1532 06/06/23 0959   06/03/23 2200  metroNIDAZOLE (FLAGYL) tablet 500 mg       Placed in "Or" Linked Group   500 mg Oral Every 12 hours 06/03/23 1532 06/06/23 0959   06/01/23 1000  metroNIDAZOLE (FLAGYL) IVPB 500 mg  Status:  Discontinued        500 mg 100 mL/hr over 60 Minutes Intravenous Every 12 hours 05/31/23 2343 06/01/23 0103   06/01/23 0115  metroNIDAZOLE (FLAGYL) IVPB 500 mg  Status:  Discontinued        500 mg 100 mL/hr over 60 Minutes Intravenous Every 12 hours 06/01/23 0103 06/03/23 1532   05/31/23 1140  sodium chloride 0.9 % with cefoTEtan (CEFOTAN) ADS Med       Note to Pharmacy: Clovis Cao M: cabinet override      05/31/23 1140 05/31/23 1350   05/31/23 1115  cefoTEtan (CEFOTAN) 2 g in sodium chloride 0.9 % 100 mL IVPB        2 g 200 mL/hr over 30 Minutes Intravenous On call to O.R. 05/31/23 1020 05/31/23 1400   05/27/23 1000  metroNIDAZOLE (FLAGYL) tablet 500 mg  Status:  Discontinued        500 mg Oral Every 12 hours 05/27/23 0722 05/31/23 2343   05/23/23 2315  cefTRIAXone (ROCEPHIN) 2 g in sodium chloride 0.9 % 100 mL IVPB        2 g 200 mL/hr over 30 Minutes Intravenous Every 24 hours 05/23/23 2305 06/06/23 2314   05/23/23 2315  metroNIDAZOLE (FLAGYL) IVPB 500 mg  Status:  Discontinued        500 mg 100 mL/hr over 60 Minutes Intravenous Every 12 hours 05/23/23 2305 05/27/23 0722        Objective: Vitals:    06/05/23 0015 06/05/23 0412 06/05/23 0736 06/05/23 1107  BP: (!) 159/64 (!) 158/75 (!) 156/87 (!) 144/104  Pulse: 88 86 88 81  Resp:  20 20 18   Temp:  97.8 F (36.6 C) 97.8 F (36.6 C)   TempSrc:  Oral Oral   SpO2: 100% 100% 100% 100%  Weight:  (!) 153.9 kg    Height:        Intake/Output Summary (Last 24 hours) at 06/05/2023 1351 Last data filed at 06/05/2023 0900 Gross per 24 hour  Intake 2145.13 ml  Output 1870 ml  Net 275.13 ml   Filed Weights   06/03/23 0431 06/04/23 0500 06/05/23 0412  Weight: (!) 152.4 kg (!) 154.4 kg (!) 153.9 kg    Examination:  General exam: Appears calm and comfortable.  Patient is morbidly obese. Respiratory system: Clear to auscultation.  Cardiovascular system: S1 & S2 heard Gastrointestinal system: Abdomen is obese,  + JP  drain, colostomy, wound VAC in place Central nervous system: Alert and oriented Extremities: Fullness of the ankle  Data Reviewed: I have personally reviewed following labs and imaging studies  CBC: Recent Labs  Lab 06/02/23 0415 06/02/23 1450 06/03/23 0558 06/04/23 1107 06/05/23 0922  WBC 22.3* 21.1* 19.3* 17.7* 14.3*  NEUTROABS  --   --   --   --  11.2*  HGB 8.2* 8.2* 7.8* 8.9* 7.9*  HCT 25.2* 25.2* 23.6* 27.7* 24.0*  MCV 95.5 96.6 98.7 96.2 100.4*  PLT 271 264 295 374 303   Basic Metabolic Panel: Recent Labs  Lab 05/31/23 1414 05/31/23 1533 05/31/23 1751 06/01/23 0424 06/02/23 0415 06/03/23 0824 06/05/23 1003  NA 132*   < > 134* 130* 133* 136 138  K 4.7   < > 4.9 4.3 4.1 4.3 4.1  CL 103  --   --  102 103 107 106  CO2  --   --   --  23 24 20* 22  GLUCOSE 246*  --   --  257* 195* 178* 153*  BUN 38*  --   --  41* 30* 29* 28*  CREATININE 1.10  --   --  1.20 1.04 1.09 1.08  CALCIUM  --   --   --  8.3* 8.6* 8.8* 8.9  MG  --   --   --  1.5* 1.8 1.9 1.8  PHOS  --   --   --  2.5 3.0 3.6 4.0   < > = values in this interval not displayed.   GFR: Estimated Creatinine Clearance: 109.8 mL/min (by C-G formula  based on SCr of 1.08 mg/dL). Liver Function Tests: Recent Labs  Lab 06/01/23 0424  AST 15  ALT 14  ALKPHOS 32*  BILITOT 0.4  PROT 5.8*  ALBUMIN 2.6*   No results for input(s): "LIPASE", "AMYLASE" in the last 168 hours. No results for input(s): "AMMONIA" in the last 168 hours. Coagulation Profile: No results for input(s): "INR", "PROTIME" in the last 168 hours. Cardiac Enzymes: No results for input(s): "CKTOTAL", "CKMB", "CKMBINDEX", "TROPONINI" in the last 168 hours. BNP (last 3 results) No results for input(s): "PROBNP" in the last 8760 hours. HbA1C: No results for input(s): "HGBA1C" in the last 72 hours. CBG: Recent Labs  Lab 06/05/23 0004 06/05/23 0411 06/05/23 0547 06/05/23 0756 06/05/23 1106  GLUCAP 142* 128* 135* 118* 147*   Lipid Profile: Recent Labs    06/05/23 0922 06/05/23 0941  TRIG 531* 45   Thyroid Function Tests: No results for input(s): "TSH", "T4TOTAL", "FREET4", "T3FREE", "THYROIDAB" in the last 72 hours. Anemia Panel: No results for input(s): "VITAMINB12", "FOLATE", "FERRITIN", "TIBC", "IRON", "RETICCTPCT" in the last 72 hours. Sepsis Labs: No results for input(s): "PROCALCITON", "LATICACIDVEN" in the last 168 hours.  Recent Results (from the past 240 hours)  MRSA Next Gen by PCR, Nasal     Status: None   Collection Time: 05/31/23  6:08 PM   Specimen: Nasal Mucosa; Nasal Swab  Result Value Ref Range Status   MRSA by PCR Next Gen NOT DETECTED NOT DETECTED Final    Comment: (NOTE) The GeneXpert MRSA Assay (FDA approved for NASAL specimens only), is one component of a comprehensive MRSA colonization surveillance program. It is not intended to diagnose MRSA infection nor to guide or monitor treatment for MRSA infections. Test performance is not FDA approved in patients less than 43 years old. Performed at [facility], [address], [place], [place] [address]  Radiology Studies: [place] EKG SITE RITE Result Date: 06/04/2023 If  Site Rite image not attached, placement could not be confirmed due to current cardiac rhythm.      Scheduled Meds:  amiodarone  200 mg Oral Daily   Chlorhexidine Gluconate Cloth  6 each Topical Daily   feeding supplement  237 mL Oral TID BM   insulin aspart  0-20 Units Subcutaneous TID WC   insulin aspart  0-5 Units Subcutaneous QHS   lidocaine  1 patch Transdermal Q24H   methocarbamol  1,000 mg Oral Q8H   metroNIDAZOLE  500 mg Oral Q12H   [START ON 06/06/2023] multivitamin with minerals  1 tablet Oral Daily   pantoprazole  40 mg Oral Daily   Continuous Infusions:  cefTRIAXone (ROCEPHIN)  IV Stopped (06/04/23 2257)   heparin 2,700 Units/hr (06/05/23 1344)   liver oil-zinc oxide     metronidazole 500 mg (06/03/23 2245)   TPN ADULT (ION) 55 mL/hr at 06/05/23 0639   TPN ADULT (ION)       LOS: 13 days   Time spent: 35 minutes   Barnetta Chapel, MD [facility] 06/05/2023, 1:51 PM   Available via Epic secure chat 7am-7pm After these hours, please refer to coverage provider listed on [email or web address]

## 2023-06-05 NOTE — Progress Notes (Signed)
 PHARMACY - TOTAL PARENTERAL NUTRITION CONSULT NOTE  Indication:  chronic malnutrition chronic colocutaneous fistula/ subcutaneous abscess   Patient Measurements: Height: 6' (182.9 cm) Weight: (!) 153.9 kg (339 lb 4.6 oz) IBW/kg (Calculated) : 77.6 TPN AdjBW (KG): 95.5 Body mass index is 46.02 kg/m. Usual Weight: 152-155 kg (fluctuating over the past 6 months; 170 kg 04/2022) 2/26 standing weight 134 kg (confirmed with RN and patient) 3/01 standing wt =150.2kg per RN   Assessment:  74 YOM with with PMH significant for HTN, DM2, DVT/PE on Xarelto, PAF, HFpEF with a recent admission for LLQ IA abscess, colocutaneous fistula, and abdominal wall abscess on 04/27/2023, had IR drain placed into the LLQ abscess and the IA abscess aspirated on 1/31. He returned to the ED with concerns that the drain in his abdomen was not functioning and now is in need of surgery with a possible colostomy.   Per chart review, this has been going on for 8 months resulting in about 30 lbs weight loss. Of note, bed weights are significantly higher than standing weights. Per surgery, allow patient to eat freely by mouth. Patient has been eating 100% of eggs, sausage, cereal, toast, orange juice and coffee for breakfast and spaghetti, [place] and gravy, green beans, creamed potatoes for lunch/dinner. Pharmacy consulted to manage TPN to optimize nutrition prior to surgery.   Transitioned patient to compounded TPN 3/5 given that NPO for procedure and will likely have reduced PO intake post-op. Clinimix (no electrolytes) only met approximately 73% of kcal requirements.  Glucose / Insulin: BG 142-201, Used 23 units SS/24 hrs, 50 units insulin in TPN - A1c 6.5% on Farxiga, Januvia and metformin PTA Electrolytes: first labs 3/10 drawn from PICC without holding TPN > repeat labs after holding: mg 1.8, others wnl  Renal: SCr 1.09 (BL ~1.5), BUN down to 29 Hepatic: LFTs / tbili wnl, TG 45, albumin 2.6, prealbumin 15 > 22 Intake /  Output; MIVF: UOP 0.4 ml/kg/hr, drain 110 mL, ostomy  GI Imaging:  2/25 CT: previously identified IA abscess dec with drain, air-fluid collection in tissue concerning for abd wall abscess  GI Surgeries / Procedures:  3/5 ex-lap with LOA, left colectomy, SBR  Central access: PICC placed 05/25/23 TPN start date: 05/25/23   Nutritional Goals: RD Estimated Needs Total Energy Estimated Needs: 2400-2600 kcal Total Protein Estimated Needs: 160-180 gm Total Fluid Estimated Needs: >2L/day  Goal TPN: 110 ml/hr to provide 161g AA and 2411 kCal  Current Nutrition:  TPN (lytes added 3/7, on Clinimix without lytes prior) 2/27 FLD - ate 100% 2/28 heart diet - eating 100% of meals  3/5 NPO for surgery 3/8 CLD 3/9 FLD- pt can't remember what he had but it wasn't much. States he likes Ensure 3/10 FLD- ate half of grits, 1 yogurt, and milk for breakfast > advance to solids per Surgery, add Ensure   Plan:  Continue half TPN at 55 ml/hr per Surgery, provides 81 g protein, 1207 Kcals  Electrolytes in TPN: Na 130 mEq/L, K 50 mEq/L, decrease Ca 3 mEq/L,  increase Mag 15 mEq/L, decrease Phos 8 mmol/L, Cl:Ac 1:1 Remove multivitamin and trace elements from TPN, give PO  Decrease resistant SSI TID AC and add bedtime SSI  Add 25 units insulin regular in TPN Monitor TPN labs on Mon/Thurs F/u PO intake/diet advancement to wean off of TPN  Alphia Moh, PharmD, BCPS, BCCP Clinical Pharmacist  Please check AMION for all [facility] Pharmacy phone numbers After 10:00 PM, call Main Pharmacy [phone]

## 2023-06-05 NOTE — Progress Notes (Signed)
 OT Cancellation Note  Patient Details Name: Bradley Torres MRN: [id number] DOB: [birth year]   Cancelled Treatment:    Reason Eval/Treat Not Completed: Pain limiting ability to participate (Patient stating increased abdominal pain and requested OT attempt later today.) Alfonse Flavors, OTA Acute Rehabilitation Services  Office [phone]  Dewain Penning 06/05/2023, 11:33 AM

## 2023-06-05 NOTE — Progress Notes (Signed)
 5 Days Post-Op   Subjective/Chief Complaint: Reports some nausea yesterday, resolved today. Tolerating some full liquids but not much appetite. Colostomy productive. WBC downtrending.   Objective: Vital signs in last 24 hours: Temp:  [97.6 F (36.4 C)-97.8 F (36.6 C)] 97.8 F (36.6 C) (03/10 0736) Pulse Rate:  [81-94] 81 (03/10 1107) Resp:  [18-20] 18 (03/10 1107) BP: (143-163)/(64-104) 144/104 (03/10 1107) SpO2:  [100 %] 100 % (03/10 1107) Weight:  [153.9 kg] 153.9 kg (03/10 0412) Last BM Date : 06/05/23  Intake/Output from previous day: 03/09 0701 - 03/10 0700 In: 1907.1 [P.O.:358; I.V.:1449.1; IV Piggyback:100] Out: 1960 [Urine:1450; Drains:110; Stool:400] Intake/Output this shift: Total I/O In: 356 [P.O.:356] Out: 250 [Urine:250]  General: alert and oriented CV: RRR Pulm effort normal Abdomen: soft, appropriately tender, wound vac in place, JP serosanguinous. Left lateral wound is clean with no purulent drainage. Colostomy pink and productive of stool.  Lab Results:  Recent Labs    06/04/23 1107 06/05/23 0922  WBC 17.7* 14.3*  HGB 8.9* 7.9*  HCT 27.7* 24.0*  PLT 374 303   BMET Recent Labs    06/03/23 0824 06/05/23 1003  NA 136 138  K 4.3 4.1  CL 107 106  CO2 20* 22  GLUCOSE 178* 153*  BUN 29* 28*  CREATININE 1.09 1.08  CALCIUM 8.8* 8.9   PT/INR No results for input(s): "LABPROT", "INR" in the last 72 hours.    Assessment/Plan: POD 5 ex lap, SBR, left colectomy, transverse colostomy, debridement ab wall by Dr. 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 Increase frequency of Ensure shakes. - pathology pending - WOCN for new ostomy teaching. Will need ostomy clinic referral at d/c - Wound vac to midline, changed today, no signs of fascial dehiscence. Change  M/Th. - Continue BID WTD to left abdominal wound - Mobilize, PT - Pulm toilet, IS   FEN - Soft diet, 1/2 TPN VTE - SCDs, on heparin gtt for a-fib ID - Rocephin/Flagyl 4/5, will need to monitor wbc and drain   Bradley Torres 06/05/2023

## 2023-06-05 NOTE — Consult Note (Signed)
 WOC Nurse Consult Note: Patient is premedicated prior to wound and ostomy care.  Reason for Consult: Vac change Midline surgical wound,  Measurement: 19,5 cm x 7,5 cm x 6 cm  Wound bed: Pale, friable, 10% adipose noted in wound bed Drainage (amount, consistency, odor) moderate serosanguinous, 150 ml on the canister at 0900, no odor Periwound: LUQ colostomy. Dressing procedure/placement/frequency: Removed old NPWT dressing Cleansed wound with normal saline Periwound skin protected with skin barrier wipe or window framed with drape Skin protected to the hip with VAC drape for foam bridge  Filled wound with 1 piece of black foam  Sealed NPWT dressing at HG Patient received IV pain medication per bedside nurse prior to dressing change Patient tolerated procedure well  WOC nurse will continue to provide NPWT dressing changed due to the complexity of the dressing change. MON/THURS.   WOC Nurse ostomy consult note Stoma type/location: LUQ colostomy Stomal assessment/size: 60mmx35mm red and viable.  Peristomal assessment: midline incision with VAC dressing.    Treatment options for stomal/peristomal skin: barrier ring 2 piece 2 3/4" pouch Output blood and some stools (80 ml at 0900). Ostomy pouching: 2pc. Flat with barrier ring   Education provided: Pt was having attention during the ostomy change. Cut as te same size and shape as the ostomy,  Apply the ring surrounding the cut, Change the bag twice a week. We will provide more educational instruction next time. The pt was in pain and not able to full learning. Enrolled patient in [employer] Secure Start DC program: YES (03/06)  WOC team will follow THURS. Thank-you,  Denyse Amass BSN, RN, [employer], WOC  (Pager: [phone])

## 2023-06-06 DIAGNOSIS — K632 Fistula of intestine: Secondary | ICD-10-CM | POA: Diagnosis not present

## 2023-06-06 LAB — CBC
HCT: 25.7 % — ABNORMAL LOW (ref 39.0–52.0)
Hemoglobin: 8.2 g/dL — ABNORMAL LOW (ref 13.0–17.0)
MCH: 30.6 pg (ref 26.0–34.0)
MCHC: 31.9 g/dL (ref 30.0–36.0)
MCV: 95.9 fL (ref 80.0–100.0)
Platelets: 332 10*3/uL (ref 150–400)
RBC: 2.68 MIL/uL — ABNORMAL LOW (ref 4.22–5.81)
RDW: 17.4 % — ABNORMAL HIGH (ref 11.5–15.5)
WBC: 13.4 10*3/uL — ABNORMAL HIGH (ref 4.0–10.5)
nRBC: 0.1 % (ref 0.0–0.2)

## 2023-06-06 LAB — HEPARIN LEVEL (UNFRACTIONATED)
Heparin Unfractionated: 0.93 [IU]/mL — ABNORMAL HIGH (ref 0.30–0.70)
Heparin Unfractionated: 0.81 [IU]/mL — ABNORMAL HIGH (ref 0.30–0.70)

## 2023-06-06 LAB — GLUCOSE, CAPILLARY
Glucose-Capillary: 114 mg/dL — ABNORMAL HIGH (ref 70–99)
Glucose-Capillary: 118 mg/dL — ABNORMAL HIGH (ref 70–99)
Glucose-Capillary: 127 mg/dL — ABNORMAL HIGH (ref 70–99)
Glucose-Capillary: 128 mg/dL — ABNORMAL HIGH (ref 70–99)
Glucose-Capillary: 138 mg/dL — ABNORMAL HIGH (ref 70–99)
Glucose-Capillary: 144 mg/dL — ABNORMAL HIGH (ref 70–99)
Glucose-Capillary: 149 mg/dL — ABNORMAL HIGH (ref 70–99)

## 2023-06-06 MED ORDER — ALUM & MAG HYDROXIDE-SIMETH 200-200-20 MG/5ML PO SUSP
30.0000 mL | ORAL | Status: DC | PRN
  Administered 2023-06-06: 30 mL via ORAL
  Filled 2023-06-06: qty 30

## 2023-06-06 MED ORDER — ENSURE ENLIVE PO LIQD
237.0000 mL | Freq: Two times a day (BID) | ORAL | Status: DC
  Administered 2023-06-06 – 2023-06-10 (×5): 237 mL via ORAL

## 2023-06-06 NOTE — Progress Notes (Signed)
 PHARMACY - TOTAL PARENTERAL NUTRITION CONSULT NOTE  Indication:  chronic malnutrition chronic colocutaneous fistula/ subcutaneous abscess   Patient Measurements: Height: 6' (182.9 cm) Weight: (!) 151.6 kg (334 lb 3.5 oz) IBW/kg (Calculated) : 77.6 TPN AdjBW (KG): 95.5 Body mass index is 45.33 kg/m. Usual Weight: 152-155 kg (fluctuating over the past 6 months; 170 kg 04/2022) 2/26 standing weight 134 kg (confirmed with RN and patient) 3/01 standing wt =150.2kg per RN   Assessment:  36 YOM with with PMH significant for HTN, DM2, DVT/PE on Xarelto, PAF, HFpEF with a recent admission for LLQ IA abscess, colocutaneous fistula, and abdominal wall abscess on 04/27/2023, had IR drain placed into the LLQ abscess and the IA abscess aspirated on 1/31. He returned to the ED with concerns that the drain in his abdomen was not functioning and now is in need of surgery with a possible colostomy.   Per chart review, this has been going on for 8 months resulting in about 30 lbs weight loss. Of note, bed weights are significantly higher than standing weights. Per surgery, allow patient to eat freely by mouth. Patient has been eating 100% of eggs, sausage, cereal, toast, orange juice and coffee for breakfast and spaghetti, [place] and gravy, green beans, creamed potatoes for lunch/dinner. Pharmacy consulted to manage TPN to optimize nutrition prior to surgery.   Transitioned patient to compounded TPN 3/5 given that NPO for procedure and will likely have reduced PO intake post-op. Clinimix (no electrolytes) only met approximately 73% of kcal requirements.  Glucose / Insulin: BG 114-153, Used 10 units SS/24 hrs, 25 units insulin in TPN - A1c 6.5% on Farxiga, Januvia and metformin PTA Electrolytes: first labs 3/10 drawn from PICC without holding TPN > repeat labs after holding: mg 1.8, others wnl  Renal: SCr 1.09 (BL ~1.5), BUN down to 29 Hepatic: LFTs / tbili wnl, TG 45, albumin 2.6, prealbumin 15 > 22 Intake /  Output; MIVF: UOP 0.4 ml/kg/hr, drain 75 mL, ostomy  GI Imaging:  2/25 CT: previously identified IA abscess dec with drain, air-fluid collection in tissue concerning for abd wall abscess  GI Surgeries / Procedures:  3/5 ex-lap with LOA, left colectomy, SBR  Central access: PICC placed 05/25/23 TPN start date: 05/25/23   Nutritional Goals: RD Estimated Needs Total Energy Estimated Needs: 2400-2600 kcal Total Protein Estimated Needs: 160-180 gm Total Fluid Estimated Needs: >2L/day  Goal TPN: 110 ml/hr to provide 161g AA and 2411 kCal  Current Nutrition:  TPN (lytes added 3/7, on Clinimix without lytes prior) 2/27 FLD - ate 100% 2/28 heart diet - eating 100% of meals  3/5 NPO for surgery 3/8 CLD 3/9 FLD- pt can't remember what he had but it wasn't much. States he likes Ensure 3/10 FLD- ate half of grits, 1 yogurt, and milk for breakfast > advance to solids per Surgery, didn't have lunch due to not feeling well. For dinner had about half of plate (chicken, creamed potatoes and gravy, mac and cheese), 1 Ensure 3/11 for breakfast ate all of bacon and eggs, half an english muffin and a cup of peaches. Patient's nausea and pain are much improved and mostly he doesn't like the food here due to lack of flavor  Plan:  Stop TPN after current bag is done per discussion with Surgery. Entered orders to decrease to half rate x2 hrs at 1600 then stop.  Continue rSSI with meals   Alphia Moh, PharmD, BCPS, [facility] Clinical Pharmacist  Please check AMION for all [facility]  phone numbers After 10:00 PM, call Main Pharmacy [phone]

## 2023-06-06 NOTE — Progress Notes (Signed)
 Physical Therapy Treatment Patient Details Name: Bradley Torres MRN: [id number] DOB: [birth year] Today's Date: 06/06/2023   History of Present Illness Pt is a 63 y.o. male admitted 2/25 with acute renal failure and chronic colocutaneous fistula. He underwent L colectomy and transverse colostomy 3/5. ICU stay from 3/5-3/8. PMH: HTN, DMII, morbid obesity, OSA with CPAP intolerance, h/o DVT and PE on Xarelto, PAF, chronic HFpEF   PT Comments  Pt furthered OOB mobility this session by participating in gait. He ambulated ~76ft using RW with CGA and a chair follow. He took a prolonged seated rest with cues for PLB to aid in recovery and was able to perform a second bout. Patient would benefit from post-acute rehab, <3 hours/day therapy to increase his independence and safety with mobility.      If plan is discharge home, recommend the following: A lot of help with bathing/dressing/bathroom;A lot of help with walking and/or transfers;Help with stairs or ramp for entrance;Assistance with cooking/housework;Assist for transportation   Can travel by private vehicle     Yes  Equipment Recommendations  None recommended by PT (Pt already has DME)    Recommendations for Other Services       Precautions / Restrictions Precautions Precautions: Fall;Other (comment) (Abdominal) Recall of Precautions/Restrictions: Intact Precaution/Restrictions Comments: Abdominal Wound Vac Restrictions Weight Bearing Restrictions Per Provider Order: No     Mobility  Bed Mobility Overal bed mobility: Needs Assistance Bed Mobility: Supine to Sit     Supine to sit: HOB elevated, Used rails, Contact guard     General bed mobility comments: Cued pt on log roll technique. He bent LLE to help push himself over, pulled with bedrail and RUE on PT to sit up on R side of bed.    Transfers Overall transfer level: Needs assistance Equipment used: Rolling walker (2 wheels) Transfers: Sit to/from Stand Sit to Stand: From  elevated surface, Contact guard assist           General transfer comment: Pt stood from a raised bed height and recliner chair with CGA. He demonstratd proper hand positioning and was able to power up. Pt took increased time to stedy himself once he reached erect posture. Good eccentric control with sitting.    Ambulation/Gait Ambulation/Gait assistance: Contact guard assist, +2 safety/equipment (Chair Follow) Gait Distance (Feet): 10 Feet (x2 with prolonged seated rest between bouts) Assistive device: Rolling walker (2 wheels) Gait Pattern/deviations: Step-to pattern, Decreased stride length, Trunk flexed, Wide base of support Gait velocity: reduced Gait velocity interpretation: <1.31 ft/sec, indicative of household ambulator   General Gait Details: Pt ambulated to his doorway twice. He took short steps with adequate foot clearence, WBOS for increased stability, and fwd flex trunk over RW. VC/TC to correct posture and increase step length without carryover noted in session. Cued PLB during gait.   Stairs             Wheelchair Mobility     Tilt Bed    Modified Rankin (Stroke Patients Only)       Balance Overall balance assessment: Needs assistance Sitting-balance support: Feet supported Sitting balance-Leahy Scale: Fair Sitting balance - Comments: Pt sat EOB and scooted fwd/bkwd in recliner chair with supervision.   Standing balance support: Bilateral upper extremity supported, During functional activity, Reliant on assistive device for balance Standing balance-Leahy Scale: Poor Standing balance comment: Pt depends on RW with OOB mobility, fwd flex posture over device, CGA at trunk.  Communication Communication Communication: No apparent difficulties  Cognition Arousal: Alert Behavior During Therapy: Flat affect   PT - Cognitive impairments: No apparent impairments                         Following commands:  Intact      Cueing Cueing Techniques: Verbal cues, Tactile cues  Exercises      General Comments General comments (skin integrity, edema, etc.): VSS on RA.      Pertinent Vitals/Pain Pain Assessment Pain Assessment: 0-10 Pain Score: 9  Pain Location: Abdomen Pain Descriptors / Indicators: Aching, Discomfort, Tender Pain Intervention(s): Monitored during session, Repositioned, Patient requesting pain meds-RN notified    Home Living                          Prior Function            PT Goals (current goals can now be found in the care plan section) Acute Rehab PT Goals Patient Stated Goal: Have less abdominal pain. Progress towards PT goals: Progressing toward goals    Frequency    Min 2X/week      PT Plan      Co-evaluation              AM-PAC PT "6 Clicks" Mobility   Outcome Measure  Help needed turning from your back to your side while in a flat bed without using bedrails?: A Little Help needed moving from lying on your back to sitting on the side of a flat bed without using bedrails?: A Lot Help needed moving to and from a bed to a chair (including a wheelchair)?: A Little Help needed standing up from a chair using your arms (e.g., wheelchair or bedside chair)?: A Little Help needed to walk in hospital room?: Total Help needed climbing 3-5 steps with a railing? : Total 6 Click Score: 13    End of Session Equipment Utilized During Treatment: Gait belt Activity Tolerance: Patient limited by pain;Patient limited by fatigue Patient left: in chair;with call bell/phone within reach;with chair alarm set Nurse Communication: Mobility status;Patient requests pain meds PT Visit Diagnosis: Other abnormalities of gait and mobility (R26.89);Pain Pain - Right/Left: Right Pain - part of body:  (Abdomen)     Time: 1011-1040 PT Time Calculation (min) (ACUTE ONLY): 29 min  Charges:    $Gait Training: 23-37 mins PT General Charges $$ ACUTE PT  VISIT: 1 Visit                     Cheri Guppy, PT, DPT Acute Rehabilitation Services Office: [phone] Secure Chat Preferred  Richardson Chiquito 06/06/2023, 3:51 PM

## 2023-06-06 NOTE — Progress Notes (Signed)
 6 Days Post-Op   Subjective/Chief Complaint: Feeling much better today. Nausea and pain improved. PO intake increasing.   Objective: Vital signs in last 24 hours: Temp:  [97.5 F (36.4 C)-98 F (36.7 C)] 97.7 F (36.5 C) (03/11 0448) Pulse Rate:  [74-88] 82 (03/11 0743) Resp:  [13-18] 13 (03/11 0743) BP: (141-152)/(69-80) 141/80 (03/11 0743) SpO2:  [95 %-97 %] 97 % (03/11 0743) Weight:  [151.6 kg] 151.6 kg (03/11 0450) Last BM Date : 06/05/23  Intake/Output from previous day: 03/10 0701 - 03/11 0700 In: 1251.9 [P.O.:356; I.V.:895.9] Out: 1875 [Urine:1325; Drains:75; Stool:475] Intake/Output this shift: Total I/O In: 240 [P.O.:240] Out: 300 [Drains:25; Stool:275]  General: alert and oriented CV: RRR Pulm effort normal Abdomen: soft, appropriately tender, wound vac in place, JP serosanguinous. Left lateral wound with clean dry dressing. Colostomy pink and productive of soft stool.  Lab Results:  Recent Labs    06/05/23 0922 06/06/23 0800  WBC 14.3* 13.4*  HGB 7.9* 8.2*  HCT 24.0* 25.7*  PLT 303 332   BMET Recent Labs    06/05/23 1003  NA 138  K 4.1  CL 106  CO2 22  GLUCOSE 153*  BUN 28*  CREATININE 1.08  CALCIUM 8.9   PT/INR No results for input(s): "LABPROT", "INR" in the last 72 hours.    Assessment/Plan: POD 6 ex lap, SBR, left colectomy, transverse colostomy, debridement ab wall by Dr. Dwain Sarna on 05/30/23 for chronic colocutaneous fistula thought to be related to a diverticular episode  - Completed 5 days abx postop. WBC continues to downtrend. - Stop TPN after current bag rounds out. Continue soft low-fiber diet. - Surgical pathology results benign - WOCN for new ostomy teaching. Will need ostomy clinic referral at d/c. - Wound vac to midline, change M/Th - Continue BID WTD to left abdominal wound - Mobilize, PT - Pulm toilet, IS   FEN - Soft diet VTE - SCDs, on heparin gtt for a-fib, ok for therapeutic lovenox. ID - None (completed  abx)   Fritzi Mandes 06/06/2023

## 2023-06-06 NOTE — Plan of Care (Signed)
  Problem: Education: Goal: Ability to describe self-care measures that may prevent or decrease complications (Diabetes Survival Skills Education) will improve Outcome: Progressing   Problem: Coping: Goal: Ability to adjust to condition or change in health will improve Outcome: Progressing   Problem: Fluid Volume: Goal: Ability to maintain a balanced intake and output will improve Outcome: Progressing   Problem: Health Behavior/Discharge Planning: Goal: Ability to identify and utilize available resources and services will improve Outcome: Progressing Goal: Ability to manage health-related needs will improve Outcome: Progressing   Problem: Metabolic: Goal: Ability to maintain appropriate glucose levels will improve Outcome: Progressing   Problem: Nutritional: Goal: Maintenance of adequate nutrition will improve Outcome: Progressing Goal: Progress toward achieving an optimal weight will improve Outcome: Progressing   Problem: Skin Integrity: Goal: Risk for impaired skin integrity will decrease Outcome: Progressing   Problem: Education: Goal: Knowledge of General Education information will improve Description: Including pain rating scale, medication(s)/side effects and non-pharmacologic comfort measures Outcome: Progressing   Problem: Health Behavior/Discharge Planning: Goal: Ability to manage health-related needs will improve Outcome: Progressing   Problem: Clinical Measurements: Goal: Ability to maintain clinical measurements within normal limits will improve Outcome: Progressing Goal: Will remain free from infection Outcome: Progressing Goal: Diagnostic test results will improve Outcome: Progressing Goal: Respiratory complications will improve Outcome: Progressing   Problem: Activity: Goal: Risk for activity intolerance will decrease Outcome: Progressing   Problem: Nutrition: Goal: Adequate nutrition will be maintained Outcome: Progressing   Problem:  Coping: Goal: Level of anxiety will decrease Outcome: Progressing   Problem: Elimination: Goal: Will not experience complications related to bowel motility Outcome: Progressing Goal: Will not experience complications related to urinary retention Outcome: Progressing   Problem: Pain Managment: Goal: General experience of comfort will improve and/or be controlled Outcome: Progressing   Problem: Safety: Goal: Ability to remain free from injury will improve Outcome: Progressing   Problem: Skin Integrity: Goal: Risk for impaired skin integrity will decrease Outcome: Progressing

## 2023-06-06 NOTE — Plan of Care (Signed)

## 2023-06-06 NOTE — Progress Notes (Signed)
 PHARMACY - ANTICOAGULATION CONSULT NOTE  Pharmacy Consult for heparin initiation Indication: hx pulmonary embolus  Allergies  Allergen Reactions   Aldactone [Spironolactone] Hives   Bystolic [Nebivolol Hcl] Swelling   Motrin [Ibuprofen] Other (See Comments)    Rectal bleed   Actos [Pioglitazone] Other (See Comments) and Cough    Dyspnea  Edema   Trulicity [Dulaglutide] Other (See Comments) and Cough    Shortness of breath GI intolerance   Zestril [Lisinopril] Other (See Comments) and Cough    Wheezing    Cozaar [Losartan] Rash   Diflucan [Fluconazole] Rash   Hctz [Hydrochlorothiazide] Other (See Comments)    Gout flares.   Norvasc [Amlodipine] Itching and Other (See Comments)    Myalgias     Patient Measurements: Height: 6' (182.9 cm) Weight: (!) 151.6 kg (334 lb 3.5 oz) IBW/kg (Calculated) : 77.6 Heparin Dosing Weight: HEPARIN DW (KG): 112.6   Vital Signs: Temp: 97.7 F (36.5 C) (03/11 0448) Temp Source: Oral (03/11 0448) BP: 141/80 (03/11 0743) Pulse Rate: 82 (03/11 0743)  Labs: Recent Labs    06/03/23 0824 06/04/23 1107 06/04/23 1107 06/05/23 0922 06/05/23 1003 06/06/23 0800  HGB  --  8.9*   < > 7.9*  --  8.2*  HCT  --  27.7*  --  24.0*  --  25.7*  PLT  --  374  --  303  --  332  HEPARINUNFRC  --   --   --  0.67  --  0.93*  CREATININE 1.09  --   --   --  1.08  --    < > = values in this interval not displayed.    Estimated Creatinine Clearance: 108.9 mL/min (by C-G formula based on SCr of 1.08 mg/dL).   Medical History: Past Medical History:  Diagnosis Date   CAD (coronary artery disease)    a. 12/2017: cath showing 10% Proximal-LAD stenosis with no significant obstructive disease and LVEDP mildly elevated at 18 mm Hg.    Chronic back pain    Diabetes mellitus, type 2 (HCC)    DVT (deep venous thrombosis) (HCC)    Essential hypertension    Family hx of colon cancer 05/03/2017   MI (myocardial infarction) (HCC) 2019   Morbid obesity (HCC)     Panic disorder    Shoulder pain, left    Following fall   Sleep apnea    Stroke (HCC) 01/2018   Vitamin D deficiency     Medications:  Scheduled:   amiodarone  200 mg Oral Daily   Chlorhexidine Gluconate Cloth  6 each Topical Daily   feeding supplement  237 mL Oral TID BM   insulin aspart  0-20 Units Subcutaneous TID WC   insulin aspart  0-5 Units Subcutaneous QHS   lidocaine  1 patch Transdermal Q24H   methocarbamol  1,000 mg Oral Q8H   multivitamin with minerals  1 tablet Oral Daily   pantoprazole  40 mg Oral Daily    Assessment: Patient is a 63 yo M on rivaroxaban PTA for afib and hx DVT/PE. Patient was on heparin infusion 2/26 -3/3; then held for procedure 3/5. Now Surgery is ok with restarting with no bolus and Pharmacy was consulted for heparin.   Heparin level is elevated at 0.93, CBc ok and no bleeding, heparin level drawn appropriately.  Goal of Therapy:  Heparin level 0.3-0.7 units/ml Monitor platelets by anticoagulation protocol: Yes   Plan:  Reduce heparin to 2500 units/h Recheck heparin level in 6h Daily heparin  level and CBC  Fredonia Highland, PharmD, BCPS, [facility] Clinical Pharmacist [phone] Please check AMION for all [facility] numbers 06/06/2023

## 2023-06-06 NOTE — Progress Notes (Signed)
 PHARMACY - ANTICOAGULATION CONSULT NOTE  Pharmacy Consult for heparin initiation Indication: hx pulmonary embolus  Allergies  Allergen Reactions   Aldactone [Spironolactone] Hives   Bystolic [Nebivolol Hcl] Swelling   Motrin [Ibuprofen] Other (See Comments)    Rectal bleed   Actos [Pioglitazone] Other (See Comments) and Cough    Dyspnea  Edema   Trulicity [Dulaglutide] Other (See Comments) and Cough    Shortness of breath GI intolerance   Zestril [Lisinopril] Other (See Comments) and Cough    Wheezing    Cozaar [Losartan] Rash   Diflucan [Fluconazole] Rash   Hctz [Hydrochlorothiazide] Other (See Comments)    Gout flares.   Norvasc [Amlodipine] Itching and Other (See Comments)    Myalgias     Patient Measurements: Height: 6' (182.9 cm) Weight: (!) 151.6 kg (334 lb 3.5 oz) IBW/kg (Calculated) : 77.6 Heparin Dosing Weight: HEPARIN DW (KG): 112.6   Vital Signs: Temp: 98 F (36.7 C) (03/11 1637) Temp Source: Axillary (03/11 1637) BP: 137/71 (03/11 1637) Pulse Rate: 86 (03/11 1637)  Labs: Recent Labs    06/04/23 1107 06/05/23 0922 06/05/23 1003 06/06/23 0800 06/06/23 1706  HGB 8.9* 7.9*  --  8.2*  --   HCT 27.7* 24.0*  --  25.7*  --   PLT 374 303  --  332  --   HEPARINUNFRC  --  0.67  --  0.93* 0.81*  CREATININE  --   --  1.08  --   --     Estimated Creatinine Clearance: 108.9 mL/min (by C-G formula based on SCr of 1.08 mg/dL).   Assessment: Patient is a 63 yo M on rivaroxaban PTA for afib and hx DVT/PE. Patient was on heparin infusion 2/26 -3/3; then held for procedure 3/5. Now Surgery is ok with restarting with no bolus and Pharmacy was consulted for heparin.   Heparin level remains elevated but coming down (0.81) on infusion at 2500 units/hr. No bleeding noted.  Goal of Therapy:  Heparin level 0.3-0.7 units/ml Monitor platelets by anticoagulation protocol: Yes   Plan:  Reduce heparin to 2300 units/h Recheck heparin level in 6h  Christoper Fabian, PharmD,  BCPS Please see amion for complete clinical pharmacist phone list 06/06/2023 6:06 PM

## 2023-06-06 NOTE — Progress Notes (Signed)
 Nutrition Follow-up  DOCUMENTATION CODES:  Morbid obesity, Non-severe (moderate) malnutrition in context of chronic illness  INTERVENTION:  Reviewed with pt "Colostomy Nutrition Therapy" handout TPN to discontinue today Continue GI Soft diet as ordered Double protein portions with lunch and dinner Decrease Ensure Enlive po to BID  as tolerated, each supplement provides 350 kcal and 20 grams of protein. Snacks TID between meals to promote smaller, more frequent PO intake  NUTRITION DIAGNOSIS:  Moderate Malnutrition related to chronic illness as evidenced by mild fat depletion, mild muscle depletion, energy intake < or equal to 75% for > or equal to 1 month. - remains applicable  GOAL:   Patient will meet greater than or equal to 90% of their needs - progressing  MONITOR:   PO intake, Supplement acceptance, Labs, Other (Comment) (TPN tolerance)  REASON FOR ASSESSMENT:   Consult New TPN/TNA  ASSESSMENT:   63 y.o male with chronically draining colocutaneous fisutla since diverticular abscess in 2024. Recently admitted 1/30-2/7 with abdominal pain found to have LLQ intra-abdominal abscess, feculent drainage, colocutaneous fistula. Discharged with drain and antibitoics with plan to optimize nutrition before surgery for management of fistula. Now presents with generalized weakness, malaise, loss of appetite. PMH of HTN, Morbid obesity, DVT, diverticulitis, CAD, T2DM, prior stroke (2019), PAF, chronic HFpEF, CKD 3.  2/25 - Heart healthy diet  2/28 - TPN started to optimize nutritional status before surgery 3/5 - s/p exlap with lysis of adhesions, L colectomy, small bowel resection with primary anastomosis, debridement of abdominal wall abscess, end transverse colostomy, takedown of splenic flexure  3/8 - clear liquid diet 3/9 - full liquid diet 3/10 - GI soft diet  TPN still infusing. Noted plans to decrease to half rate and then stop this evening.  Ensure has been reordered for  TID for patient.  Spoke with pt at bedside. He reports that he ate well for breakfast this morning which included grits, scrambled eggs, english muffin and bacon to which he reports consuming almost the whole meal. Pt reports that he declined Ensure has he was feeling too full to consume. Per review of MAR, pt has refused 4 of 5 scheduled doses of Ensure. Discussed reducing frequency and trying to schedule between meals to help with acceptance/tolerance. Pt is amenable. He endorses intermittent nausea but no emesis. Having colostomy output. Notably double protein portions ordered for pt via meal ordering system.   RD brought handout for pt however given pt does not read or write, RD reviewed nutrition therapy for colostomy nutrition with pt. No current questions expressed by pt.   Meal completions: 3/9: 90% breakfast 3/10: 80% breakfast 3/11: 95% breakfast  Admit weight: 134.4 kg Current weight: 151.6 kg + edema: mild pitting generalized; non-pitting BLE  Drains/lines: R abdominal JP drain: 75ml x24 hours  Medications: SSI 0-20 units TID, SSI 0-5 units at bedtime, MVI, protonix  Labs:  BUN 28 CBG's 114-151 x24 hours  Diet Order:   Diet Order             DIET SOFT Room service appropriate? Yes; Fluid consistency: Thin  Diet effective now                   EDUCATION NEEDS:   Education needs have been addressed  Skin:  Skin Assessment: Skin Integrity Issues: Skin Integrity Issues:: Incisions Wound Vac: mid upper abdomen Incisions: Abdomen  Last BM:  via colostomy x24 hours  Height:   Ht Readings from Last 1 Encounters:  05/31/23 6' (  1.829 m)    Weight:   Wt Readings from Last 1 Encounters:  06/06/23 (!) 151.6 kg    Ideal Body Weight:  80.9 kg  BMI:  Body mass index is 45.33 kg/m.  Estimated Nutritional Needs:   Kcal:  2400-2600 kcal  Protein:  160-180 gm  Fluid:  >2L/day  Drusilla Kanner, RDN, LDN Clinical Nutrition

## 2023-06-06 NOTE — Progress Notes (Signed)
 PROGRESS NOTE    Bradley Torres  ZOX:[id number] DOB: 11-05-1960 DOA: 05/23/2023 PCP: Mechele Claude, MD     Brief Narrative:  Patient is a 63 year old male, with past medical history significant for morbid obesity, hypertension, type 2 diabetes mellitus, OSA with CPAP intolerance, history of DVT and PE on Xarelto, PAF, chronic HFpEF, and recent admission with LLQ intra-abdominal abscess, colocutaneous fistula, and abdominal wall abscess.  Patient presented to hospital with generalized weakness and malaise.  Of note patient had been admitted to the hospital 04/10/2023 and IR had a drain placed in the left lower quadrant abscess.  Patient was seen by ID and was discharged on Bactrim and Augmentin.  Patient developed malaise weakness lack of appetite and lightheadedness 3 to 4 days prior to admission.  On presentation to ED, patient was afebrile.  Labs revealed serum creatinine elevation at 2.9, and albumin of 2.6, and no leukocytosis.  Lactate was elevated at 3.1.  CT demonstrated percutaneous pigtail catheter in the left anterior abdominal wall with interval resolution/decrease in abscess at the catheter tip, but with persistent air-fluid collection along the catheter tract concerning for abdominal wall abscess measuring up to 6.1 cm.  General surgery was consulted and patient was admitted hospital for further evaluation and treatment.  Patient ultimately underwent ex lap with lysis of adhesion, left colectomy, small bowel resection with primary anastomosis, debridement of abdominal wall abscess, and transverse colostomy by Dr. Dwain Sarna on 05/31/2023.  Patient was transferred to ICU on a ventilator postoperatively.  He was subsequently extubated and transferred to [facility] team 06/02/2023.  06/06/2023: Patient seen.  No new complaints.  Leukocytosis continues to improve.  WBC is 13.4 today.  Patient has completed 5-day course of antibiotics postop.  Surgery team plans to stop TPN after current bag runs out.  Patient  will continue soft low fiber diet.  Other postop management as per surgery team.  Assessment & Plan:   Principal Problem:   Colocutaneous fistula Active Problems:   Chronic heart failure with preserved ejection fraction (HFpEF) (HCC)   Severe obstructive sleep apnea-hypopnea syndrome   Sleep related hypoxia   Type 2 diabetes mellitus with hyperglycemia, without long-term current use of insulin (HCC)   Paroxysmal atrial fibrillation (HCC)   History of pulmonary embolus (PE)   Abdominal wall abscess   Acute renal failure superimposed on stage 3 chronic kidney disease, unspecified acute renal failure type, unspecified whether stage 3a or 3b CKD (HCC)   Malnutrition of moderate degree   Hypotension   Colocutaneous fistula, abdominal wall abscess Septic shock, postoperative, not present on admission -Status post ex lap, left colectomy, small bowel resection, debridement of abdominal wall abscess, transverse colostomy by Dr. Dwain Sarna 05/31/2023 -TPN will be discontinued after current bag.   -Wound VAC to midline, to be changed Monday and Thursday.   -Wet-to-dry dressing twice daily to the left abdominal wound. -Colostomy clinic referral at discharge. -Patient has completed 5-day course of Flagyl and ceftriaxone today, 06/06/2023.   -Leukocytosis is slowly improving.  WBC 13.4. -Postop care as Per general surgery team  AKI on CKD stage IIIa -Baseline creatinine 1.4-1.5 -Acute kidney injury has resolved. -Serum creatinine of 1.08 today.    Paroxysmal atrial fibrillation -Continue oral amiodarone.     Chronic HFpEF  -Remains stable   Hx of DVT/PE  -Stable H/H.   -Xarelto still on hold.   -Patient is currently on heparin drip.   -Please change heparin drip back to Xarelto tomorrow, 06/07/2023, if okay with the surgery  team.     Type II DM  -Sliding scale insulin  OSA -CPAP nightly   DVT prophylaxis: SCD Place and maintain sequential compression device Start: 06/02/23  1410  Code Status: Full Family Communication:  None at bedside Disposition Plan: Home Status is: Inpatient Remains inpatient appropriate because: Remains on TPN, IV antibiotics    Antimicrobials:  Anti-infectives (From admission, onward)    Start     Dose/Rate Route Frequency Ordered Stop   06/03/23 2200  metroNIDAZOLE (FLAGYL) IVPB 500 mg       Placed in "Or" Linked Group   500 mg 100 mL/hr over 60 Minutes Intravenous Every 12 hours 06/03/23 1532 06/05/23 2046   06/03/23 2200  metroNIDAZOLE (FLAGYL) tablet 500 mg       Placed in "Or" Linked Group   500 mg Oral Every 12 hours 06/03/23 1532 06/05/23 2046   06/01/23 1000  metroNIDAZOLE (FLAGYL) IVPB 500 mg  Status:  Discontinued        500 mg 100 mL/hr over 60 Minutes Intravenous Every 12 hours 05/31/23 2343 06/01/23 0103   06/01/23 0115  metroNIDAZOLE (FLAGYL) IVPB 500 mg  Status:  Discontinued        500 mg 100 mL/hr over 60 Minutes Intravenous Every 12 hours 06/01/23 0103 06/03/23 1532   05/31/23 1140  sodium chloride 0.9 % with cefoTEtan (CEFOTAN) ADS Med       Note to Pharmacy: Clovis Cao M: cabinet override      05/31/23 1140 05/31/23 1350   05/31/23 1115  cefoTEtan (CEFOTAN) 2 g in sodium chloride 0.9 % 100 mL IVPB        2 g 200 mL/hr over 30 Minutes Intravenous On call to O.R. 05/31/23 1020 05/31/23 1400   05/27/23 1000  metroNIDAZOLE (FLAGYL) tablet 500 mg  Status:  Discontinued        500 mg Oral Every 12 hours 05/27/23 0722 05/31/23 2343   05/23/23 2315  cefTRIAXone (ROCEPHIN) 2 g in sodium chloride 0.9 % 100 mL IVPB        2 g 200 mL/hr over 30 Minutes Intravenous Every 24 hours 05/23/23 2305 06/05/23 2341   05/23/23 2315  metroNIDAZOLE (FLAGYL) IVPB 500 mg  Status:  Discontinued        500 mg 100 mL/hr over 60 Minutes Intravenous Every 12 hours 05/23/23 2305 05/27/23 0722        Objective: Vitals:   06/06/23 0035 06/06/23 0448 06/06/23 0450 06/06/23 0743  BP: (!) 144/69 (!) 148/77  (!) 141/80  Pulse:  81 74  82  Resp: 17 14  13   Temp: 97.9 F (36.6 C) 97.7 F (36.5 C)    TempSrc: Oral Oral    SpO2: 96% 95%  97%  Weight:   (!) 151.6 kg   Height:        Intake/Output Summary (Last 24 hours) at 06/06/2023 1215 Last data filed at 06/06/2023 1610 Gross per 24 hour  Intake 1135.89 ml  Output 1925 ml  Net -789.11 ml   Filed Weights   06/04/23 0500 06/05/23 0412 06/06/23 0450  Weight: (!) 154.4 kg (!) 153.9 kg (!) 151.6 kg    Examination:  General exam: Appears calm and comfortable.  Patient is morbidly obese. Respiratory system: Clear to auscultation.  Cardiovascular system: S1 & S2 heard Gastrointestinal system: Abdomen is obese,  + JP drain, colostomy, wound VAC in place Central nervous system: Alert and oriented Extremities: Fullness of the ankle  Data Reviewed: I have personally reviewed following  labs and imaging studies  CBC: Recent Labs  Lab 06/02/23 1450 06/03/23 0558 06/04/23 1107 06/05/23 0922 06/06/23 0800  WBC 21.1* 19.3* 17.7* 14.3* 13.4*  NEUTROABS  --   --   --  11.2*  --   HGB 8.2* 7.8* 8.9* 7.9* 8.2*  HCT 25.2* 23.6* 27.7* 24.0* 25.7*  MCV 96.6 98.7 96.2 100.4* 95.9  PLT 264 295 374 303 332   Basic Metabolic Panel: Recent Labs  Lab 05/31/23 1414 05/31/23 1533 05/31/23 1751 06/01/23 0424 06/02/23 0415 06/03/23 0824 06/05/23 1003  NA 132*   < > 134* 130* 133* 136 138  K 4.7   < > 4.9 4.3 4.1 4.3 4.1  CL 103  --   --  102 103 107 106  CO2  --   --   --  23 24 20* 22  GLUCOSE 246*  --   --  257* 195* 178* 153*  BUN 38*  --   --  41* 30* 29* 28*  CREATININE 1.10  --   --  1.20 1.04 1.09 1.08  CALCIUM  --   --   --  8.3* 8.6* 8.8* 8.9  MG  --   --   --  1.5* 1.8 1.9 1.8  PHOS  --   --   --  2.5 3.0 3.6 4.0   < > = values in this interval not displayed.   GFR: Estimated Creatinine Clearance: 108.9 mL/min (by C-G formula based on SCr of 1.08 mg/dL). Liver Function Tests: Recent Labs  Lab 06/01/23 0424  AST 15  ALT 14  ALKPHOS 32*   BILITOT 0.4  PROT 5.8*  ALBUMIN 2.6*   No results for input(s): "LIPASE", "AMYLASE" in the last 168 hours. No results for input(s): "AMMONIA" in the last 168 hours. Coagulation Profile: No results for input(s): "INR", "PROTIME" in the last 168 hours. Cardiac Enzymes: No results for input(s): "CKTOTAL", "CKMB", "CKMBINDEX", "TROPONINI" in the last 168 hours. BNP (last 3 results) No results for input(s): "PROBNP" in the last 8760 hours. HbA1C: No results for input(s): "HGBA1C" in the last 72 hours. CBG: Recent Labs  Lab 06/06/23 0010 06/06/23 0401 06/06/23 0624 06/06/23 0741 06/06/23 1052  GLUCAP 114* 144* 127* 118* 149*   Lipid Profile: Recent Labs    06/05/23 0922 06/05/23 0941  TRIG 531* 45   Thyroid Function Tests: No results for input(s): "TSH", "T4TOTAL", "FREET4", "T3FREE", "THYROIDAB" in the last 72 hours. Anemia Panel: No results for input(s): "VITAMINB12", "FOLATE", "FERRITIN", "TIBC", "IRON", "RETICCTPCT" in the last 72 hours. Sepsis Labs: No results for input(s): "PROCALCITON", "LATICACIDVEN" in the last 168 hours.  Recent Results (from the past 240 hours)  MRSA Next Gen by PCR, Nasal     Status: None   Collection Time: 05/31/23  6:08 PM   Specimen: Nasal Mucosa; Nasal Swab  Result Value Ref Range Status   MRSA by PCR Next Gen NOT DETECTED NOT DETECTED Final    Comment: (NOTE) The GeneXpert MRSA Assay (FDA approved for NASAL specimens only), is one component of a comprehensive MRSA colonization surveillance program. It is not intended to diagnose MRSA infection nor to guide or monitor treatment for MRSA infections. Test performance is not FDA approved in patients less than 79 years old. Performed at [facility], [address], [place], [place] [address]       Radiology Studies: No results found.      Scheduled Meds:  amiodarone  200 mg Oral Daily   Chlorhexidine Gluconate  Cloth  6 each Topical Daily   feeding supplement  237 mL  Oral TID BM   insulin aspart  0-20 Units Subcutaneous TID WC   insulin aspart  0-5 Units Subcutaneous QHS   lidocaine  1 patch Transdermal Q24H   methocarbamol  1,000 mg Oral Q8H   multivitamin with minerals  1 tablet Oral Daily   pantoprazole  40 mg Oral Daily   Continuous Infusions:  heparin 2,500 Units/hr (06/06/23 1610)   liver oil-zinc oxide     TPN ADULT (ION) 55 mL/hr at 06/05/23 1730     LOS: 14 days   Time spent: 35 minutes   Barnetta Chapel, MD [facility] 06/06/2023, 12:15 PM   Available via Epic secure chat 7am-7pm After these hours, please refer to coverage provider listed on [email or web address]

## 2023-06-07 DIAGNOSIS — K632 Fistula of intestine: Secondary | ICD-10-CM | POA: Diagnosis not present

## 2023-06-07 LAB — RENAL FUNCTION PANEL
Albumin: 2.2 g/dL — ABNORMAL LOW (ref 3.5–5.0)
Anion gap: 7 (ref 5–15)
BUN: 36 mg/dL — ABNORMAL HIGH (ref 8–23)
CO2: 25 mmol/L (ref 22–32)
Calcium: 8.7 mg/dL — ABNORMAL LOW (ref 8.9–10.3)
Chloride: 103 mmol/L (ref 98–111)
Creatinine, Ser: 1.37 mg/dL — ABNORMAL HIGH (ref 0.61–1.24)
GFR, Estimated: 59 mL/min — ABNORMAL LOW (ref >60)
Glucose, Bld: 122 mg/dL — ABNORMAL HIGH (ref 70–99)
Phosphorus: 3.5 mg/dL (ref 2.5–4.6)
Potassium: 4.3 mmol/L (ref 3.5–5.1)
Sodium: 135 mmol/L (ref 135–145)

## 2023-06-07 LAB — CBC WITH DIFFERENTIAL/PLATELET
Abs Immature Granulocytes: 0 10*3/uL (ref 0.00–0.07)
Basophils Absolute: 0.1 10*3/uL (ref 0.0–0.1)
Basophils Relative: 1 %
Eosinophils Absolute: 0.3 10*3/uL (ref 0.0–0.5)
Eosinophils Relative: 3 %
HCT: 23.8 % — ABNORMAL LOW (ref 39.0–52.0)
Hemoglobin: 7.6 g/dL — ABNORMAL LOW (ref 13.0–17.0)
Lymphocytes Relative: 10 %
Lymphs Abs: 1.1 10*3/uL (ref 0.7–4.0)
MCH: 31.1 pg (ref 26.0–34.0)
MCHC: 31.9 g/dL (ref 30.0–36.0)
MCV: 97.5 fL (ref 80.0–100.0)
Monocytes Absolute: 0.5 10*3/uL (ref 0.1–1.0)
Monocytes Relative: 4 %
Neutro Abs: 9.3 10*3/uL — ABNORMAL HIGH (ref 1.7–7.7)
Neutrophils Relative %: 82 %
Platelets: 322 10*3/uL (ref 150–400)
RBC: 2.44 MIL/uL — ABNORMAL LOW (ref 4.22–5.81)
RDW: 17.3 % — ABNORMAL HIGH (ref 11.5–15.5)
WBC: 11.4 10*3/uL — ABNORMAL HIGH (ref 4.0–10.5)
nRBC: 0 % (ref 0.0–0.2)
nRBC: 0 /100{WBCs}

## 2023-06-07 LAB — GLUCOSE, CAPILLARY
Glucose-Capillary: 124 mg/dL — ABNORMAL HIGH (ref 70–99)
Glucose-Capillary: 124 mg/dL — ABNORMAL HIGH (ref 70–99)
Glucose-Capillary: 128 mg/dL — ABNORMAL HIGH (ref 70–99)
Glucose-Capillary: 118 mg/dL — ABNORMAL HIGH (ref 70–99)
Glucose-Capillary: 144 mg/dL — ABNORMAL HIGH (ref 70–99)

## 2023-06-07 LAB — HEPARIN LEVEL (UNFRACTIONATED): Heparin Unfractionated: 0.75 [IU]/mL — ABNORMAL HIGH (ref 0.30–0.70)

## 2023-06-07 MED ORDER — RIVAROXABAN 20 MG PO TABS
20.0000 mg | ORAL_TABLET | Freq: Every day | ORAL | Status: DC
  Administered 2023-06-07 – 2023-06-10 (×4): 20 mg via ORAL
  Filled 2023-06-07 (×4): qty 1

## 2023-06-07 MED ORDER — OXYCODONE-ACETAMINOPHEN 5-325 MG PO TABS
1.0000 | ORAL_TABLET | ORAL | Status: DC | PRN
  Administered 2023-06-07 – 2023-06-10 (×13): 1 via ORAL
  Filled 2023-06-07 (×13): qty 1

## 2023-06-07 MED ORDER — FENTANYL CITRATE PF 50 MCG/ML IJ SOSY
25.0000 ug | PREFILLED_SYRINGE | INTRAMUSCULAR | Status: DC | PRN
  Administered 2023-06-08: 25 ug via INTRAVENOUS
  Filled 2023-06-07: qty 1

## 2023-06-07 MED ORDER — OXYCODONE HCL 5 MG PO TABS
5.0000 mg | ORAL_TABLET | ORAL | Status: DC | PRN
  Administered 2023-06-07 – 2023-06-10 (×13): 5 mg via ORAL
  Filled 2023-06-07: qty 2
  Filled 2023-06-07 (×12): qty 1

## 2023-06-07 NOTE — Progress Notes (Signed)
 PHARMACY - ANTICOAGULATION CONSULT NOTE  Pharmacy Consult for heparin > Xarelto Indication: hx pulmonary embolus  Allergies  Allergen Reactions   Aldactone [Spironolactone] Hives   Bystolic [Nebivolol Hcl] Swelling   Motrin [Ibuprofen] Other (See Comments)    Rectal bleed   Actos [Pioglitazone] Other (See Comments) and Cough    Dyspnea  Edema   Trulicity [Dulaglutide] Other (See Comments) and Cough    Shortness of breath GI intolerance   Zestril [Lisinopril] Other (See Comments) and Cough    Wheezing    Cozaar [Losartan] Rash   Diflucan [Fluconazole] Rash   Hctz [Hydrochlorothiazide] Other (See Comments)    Gout flares.   Norvasc [Amlodipine] Itching and Other (See Comments)    Myalgias     Patient Measurements: Height: 6' (182.9 cm) Weight: (!) 154.2 kg (339 lb 15.2 oz) IBW/kg (Calculated) : 77.6 Heparin Dosing Weight: HEPARIN DW (KG): 112.6   Vital Signs: Temp: 97.8 F (36.6 C) (03/12 0745) Temp Source: Oral (03/12 0745) BP: 125/78 (03/12 0745) Pulse Rate: 84 (03/12 0745)  Labs: Recent Labs    06/05/23 0922 06/05/23 1003 06/06/23 0800 06/06/23 1706 06/07/23 0752  HGB 7.9*  --  8.2*  --  7.6*  HCT 24.0*  --  25.7*  --  23.8*  PLT 303  --  332  --  322  HEPARINUNFRC 0.67  --  0.93* 0.81* 0.75*  CREATININE  --  1.08  --   --  1.37*    Estimated Creatinine Clearance: 86.7 mL/min (A) (by C-G formula based on SCr of 1.37 mg/dL (H)).   Assessment: Patient is a 63 yo M on rivaroxaban PTA for afib and hx DVT/PE. Patient was on heparin infusion 2/26 -3/3; then held for procedure 3/5. Now Surgery is ok with restarting with no bolus and Pharmacy was consulted for heparin.   Heparin level remains elevated but coming down (0.75) on infusion at 2300 units/hr. No bleeding noted.  Pharmacy asked to transition back to Xarelto today.  Goal of Therapy:  Heparin level 0.3-0.7 units/ml Monitor platelets by anticoagulation protocol: Yes   Plan:  Stop IV  heparin Restart Xarelto 20 mg daily.  Reece Leader, Colon Flattery, [facility] Clinical Pharmacist  06/07/2023 9:40 AM   [facility] pharmacy phone numbers are listed on [email or web address]

## 2023-06-07 NOTE — Progress Notes (Signed)
 Progress Note  7 Days Post-Op  Subjective: Pt reports he overall feels well, pain control improving - discussed adjustments to regimen today. He is agreeable to SNF on discharge but would prefer somewhere close to his home, he lives in [place], [place].  Objective: Vital signs in last 24 hours: Temp:  [97.4 F (36.3 C)-98.5 F (36.9 C)] 97.8 F (36.6 C) (03/12 0745) Pulse Rate:  [80-86] 84 (03/12 0745) Resp:  [14-16] 15 (03/12 0745) BP: (114-143)/(68-79) 125/78 (03/12 0745) SpO2:  [96 %-100 %] 100 % (03/12 0745) Weight:  [154.2 kg] 154.2 kg (03/12 0500) Last BM Date : 06/05/23  Intake/Output from previous day: 03/11 0701 - 03/12 0700 In: 240 [P.O.:240] Out: 1276.5 [Urine:975; Drains:25.5; Stool:276] Intake/Output this shift: No intake/output data recorded.  PE: General: pleasant, WD, obese male who is laying in bed in NAD Heart: regular, rate, and rhythm.   Lungs: Respiratory effort nonlabored Abd: soft, NT, ND, VAC to midline, ostomy viable and functioning, wound to LLQ inferior to ostomy clea, drain serous (removed w/o complication) Psych: A&Ox3 with an appropriate affect.    Lab Results:  Recent Labs    06/06/23 0800 06/07/23 0752  WBC 13.4* 11.4*  HGB 8.2* 7.6*  HCT 25.7* 23.8*  PLT 332 322   BMET Recent Labs    06/05/23 1003 06/07/23 0752  NA 138 135  K 4.1 4.3  CL 106 103  CO2 22 25  GLUCOSE 153* 122*  BUN 28* 36*  CREATININE 1.08 1.37*  CALCIUM 8.9 8.7*   PT/INR No results for input(s): "LABPROT", "INR" in the last 72 hours. CMP     Component Value Date/Time   NA 135 06/07/2023 0752   NA 142 02/21/2023 1544   K 4.3 06/07/2023 0752   CL 103 06/07/2023 0752   CO2 25 06/07/2023 0752   GLUCOSE 122 (H) 06/07/2023 0752   BUN 36 (H) 06/07/2023 0752   BUN 18 02/21/2023 1544   CREATININE 1.37 (H) 06/07/2023 0752   CREATININE 1.50 (H) 05/11/2023 0300   CALCIUM 8.7 (L) 06/07/2023 0752   PROT 5.8 (L) 06/01/2023 0424   PROT 7.1 02/21/2023 1544    ALBUMIN 2.2 (L) 06/07/2023 0752   ALBUMIN 3.8 (L) 02/21/2023 1544   AST 15 06/01/2023 0424   ALT 14 06/01/2023 0424   ALKPHOS 32 (L) 06/01/2023 0424   BILITOT 0.4 06/01/2023 0424   BILITOT 0.3 02/21/2023 1544   GFRNONAA 59 (L) 06/07/2023 0752   GFRAA 68 12/16/2019 0000   Lipase     Component Value Date/Time   LIPASE 25 05/23/2023 1639       Studies/Results: No results found.  Anti-infectives: Anti-infectives (From admission, onward)    Start     Dose/Rate Route Frequency Ordered Stop   06/03/23 2200  metroNIDAZOLE (FLAGYL) IVPB 500 mg       Placed in "Or" Linked Group   500 mg 100 mL/hr over 60 Minutes Intravenous Every 12 hours 06/03/23 1532 06/05/23 2046   06/03/23 2200  metroNIDAZOLE (FLAGYL) tablet 500 mg       Placed in "Or" Linked Group   500 mg Oral Every 12 hours 06/03/23 1532 06/05/23 2046   06/01/23 1000  metroNIDAZOLE (FLAGYL) IVPB 500 mg  Status:  Discontinued        500 mg 100 mL/hr over 60 Minutes Intravenous Every 12 hours 05/31/23 2343 06/01/23 0103   06/01/23 0115  metroNIDAZOLE (FLAGYL) IVPB 500 mg  Status:  Discontinued        500  mg 100 mL/hr over 60 Minutes Intravenous Every 12 hours 06/01/23 0103 06/03/23 1532   05/31/23 1140  sodium chloride 0.9 % with cefoTEtan (CEFOTAN) ADS Med       Note to Pharmacy: Clovis Cao M: cabinet override      05/31/23 1140 05/31/23 1350   05/31/23 1115  cefoTEtan (CEFOTAN) 2 g in sodium chloride 0.9 % 100 mL IVPB        2 g 200 mL/hr over 30 Minutes Intravenous On call to O.R. 05/31/23 1020 05/31/23 1400   05/27/23 1000  metroNIDAZOLE (FLAGYL) tablet 500 mg  Status:  Discontinued        500 mg Oral Every 12 hours 05/27/23 0722 05/31/23 2343   05/23/23 2315  cefTRIAXone (ROCEPHIN) 2 g in sodium chloride 0.9 % 100 mL IVPB        2 g 200 mL/hr over 30 Minutes Intravenous Every 24 hours 05/23/23 2305 06/05/23 2341   05/23/23 2315  metroNIDAZOLE (FLAGYL) IVPB 500 mg  Status:  Discontinued        500 mg 100 mL/hr  over 60 Minutes Intravenous Every 12 hours 05/23/23 2305 05/27/23 0722        Assessment/Plan  POD8 ex lap, SBR, left colectomy, transverse colostomy, debridement ab wall by Dr. 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 Will need ostomy clinic referral at d/c. - Wound vac to midline, change M/Th - Continue BID WTD to left abdominal wound - Mobilize, PT - Pulm toilet, IS - JP drain removed today - ok to transition to DOAC - stable for DC to SNF from surgical perspective    FEN - reg diet VTE - SCDs, ok to transition to DOAC ID - None currently (completed abx)   LOS: 15 days     Juliet Rude, [facility] 06/07/2023, 9:10 AM Please see Amion for pager number during day hours 7:00am-4:30pm

## 2023-06-07 NOTE — TOC Progression Note (Addendum)
 Transition of Care [facility]) - Progression Note    Patient Details  Name: Bradley Torres MRN: [id number] Date of Birth: [birth year]  [facility] CM/SW Contact  Michaela Corner, [place] Phone Number: 06/07/2023, 10:06 AM  Clinical Narrative:   Per daily meeting with treatment team, Patient is no longer on TPN. CSW will submit referrals for SNF at this time.   4:33 PM CSW and RNCM spoke with pt about only accepting bed offer in [facility] Per pt, [name] too far from [place] and he wants a SNF in [place]. CSW explained that [facility] has no bed offers at this time and other [facility] facilities did not provide a bed offer. RNCM explained to pt that he needs assistance with his wound vac and if he dc home he would not have HH due to his insurance.   Prior to [facility] coming with CSW to speak with pt, he stated to CSW that he could stay with his aunt and she could help assist with patients wound vac. CSW asked pt if he has been in contact with his aunt about this, he stated no. CSW asked permission for CSW and RNCM to contact his aunt and pt stated that is okay.   CSW will f/u with patient tomorrow regarding decision on SNF. CSW will check in with [facility] tomorrow regarding bed availability but at this time [facility] admissions stated they do not have male beds.   TOC will continue to follow.    Expected Discharge Plan: Skilled Nursing Facility Barriers to Discharge: Continued Medical Work up  Expected Discharge Plan and Services In-house Referral: Clinical Social Work     Living arrangements for the past 2 months: Single Family Home                 DME Arranged: N/A                     Social Determinants of Health (SDOH) Interventions SDOH Screenings   Food Insecurity: No Food Insecurity (05/24/2023)  Housing: Low Risk  (05/24/2023)  Transportation Needs: No Transportation Needs (05/24/2023)  Utilities: Not At Risk (05/24/2023)  Depression (PHQ2-9): Low Risk   (02/21/2023)  Financial Resource Strain: Medium Risk (05/18/2020)  Social Connections: Unknown (01/16/2020)  Stress: Stress Concern Present (05/15/2019)  Tobacco Use: Low Risk  (05/31/2023)    Readmission Risk Interventions     No data to display

## 2023-06-07 NOTE — Progress Notes (Addendum)
 PROGRESS NOTE Bradley Torres  ZOX:[id number] DOB: 1960-06-09 DOA: 05/23/2023 PCP: Mechele Claude, MD  Brief Narrative/Hospital Course: 63 yom w/ HTN T2DM-on metformin/Farxiga/Januvia, OSA with CPAP intolerance, history of DVT and PE on Xarelto, PAF-on amiodarone, chronic HFpEF, and recent admission with LLQ intra-abdominal abscess, colocutaneous fistula, and abdominal wall abscess last admit 04/10/2023 and IR had a drain placed in the left lower quadrant abscess-seen by ID and was discharged on Bactrim and Augmentin, presented to hospital with generalized weakness and malaise on 05/23/23 with malaise weakness lack of appetite lightheadedness, and admitted for AKI on CKD, lactic acidosis and abdominal wall abscess/intra-abdominal abscess and colocutaneous fistula  Significant procedures/imaging: 05/23/23>> CT abd>>percutaneous pigtail catheter in the left anterior abdominal wall with interval resolution/decrease in abscess at the catheter tip, and persistent air-fluid collection along the catheter tract concerning for abdominal wall abscess measuring up to 6.1 cm.  05/30/23>> Ex lap, open sigmoid colectomy, colostomy creation, small bowel resection, left colectomy, debridement of abdominal wall, VAC placement -Dr Dwain Sarna.  Consultation: PCCM/critical care General Surgery Cardiology for preop evaluation Wound care     Subjective: Patient seen and examined Resting comfortably  Mild left arm swelling from blood draw would like to not get more blood draws. overnight afebrile BP stable in 120s, on room air Blood sugar is stable 120s.  Labs reviewed yesterday hemoglobin 8.2 WBC 13.4   Assessment and Plan: Principal Problem:   Colocutaneous fistula Active Problems:   Chronic heart failure with preserved ejection fraction (HFpEF) (HCC)   Severe obstructive sleep apnea-hypopnea syndrome   Sleep related hypoxia   Type 2 diabetes mellitus with hyperglycemia, without long-term current use of insulin  (HCC)   Paroxysmal atrial fibrillation (HCC)   History of pulmonary embolus (PE)   Abdominal wall abscess   Acute renal failure superimposed on stage 3 chronic kidney disease, unspecified acute renal failure type, unspecified whether stage 3a or 3b CKD (HCC)   Malnutrition of moderate degree   Hypotension   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  Patient antibiotic completed x 5 days postop.  On TPN and stopped 3/12. Continue wound care for ostomy teaching AND wound VAC to midline changed Monday/Thursday Continue soft low fiber diet.  Weaning off pain medication to p.o. regimen Mobilize with PT OT pulmonary toileting incentive spirometry Per surgery okay for discharge to skilled nursing facility Discussed with CC resting this morning  AKI on CKD stage IIIa: B/l ~ 1.4-1.5.  Improved, today at 1.3 Recent Labs    05/24/23 2326 05/26/23 0306 05/27/23 0423 05/28/23 0318 05/29/23 0338 05/31/23 1214 05/31/23 1239 05/31/23 1414 05/31/23 1533 06/01/23 0424 06/02/23 0415 06/03/23 0824 06/05/23 1003 06/07/23 0752  BUN 39* 29* 31* 35* 34* 58* 36* 38*  --  41* 30* 29* 28* 36*  CREATININE 2.04* 1.47* 1.27* 1.23 1.12 1.30* 1.10 1.10  --  1.20 1.04 1.09 1.08 1.37*  CO2 27 26 27 25 26   --   --   --   --  23 24 20* 22 25  K 4.4 3.8 4.2 4.3 4.1 6.0* 4.6 4.7   < > 4.3 4.1 4.3 4.1 4.3   < > = values in this interval not displayed.    PAF: Continue amiodarone, okay to resume XARELTO 3/12 per [facility]  History of DVT/PE: Okay to resume Xarelto  Paroxysmal atrial  fibrillation. Patient is on Xarelto and amiodarone-continue as above.  Chronic HFpEF: Volume status stablE.  Type 2 diabetes mellitus: Well-controlled.  Continue SSI, on discharge resume home  metformin/Farxiga. Recent Labs  Lab 06/06/23 1052 06/06/23 1634 06/06/23 2021 06/07/23 0014 06/07/23 0616  GLUCAP 149* 138* 128* 124* 124*     OSA: Reports she has not used CPAP for the last 2 years  Chronic normocytic anemia: B/L HB ~10 g.  Monitor transfuse if less than 7.  Acute illness may have caused his drop during this hospitalization   Morbid obesity: Patient's Body mass index is 46.11 kg/m. : Will benefit with PCP follow-up, weight loss  healthy lifestyle  Moderate malnutrition: augment diet as below Etiology: chronic illness Signs/Symptoms: mild fat depletion, mild muscle depletion, energy intake < or equal to 75% for > or equal to 1 month Interventions: Refer to RD note for recommendations  DVT prophylaxis: Place and maintain sequential compression device Start: 06/02/23 1410 Code Status:   Code Status: Full Code Family Communication: plan of care discussed with patient at bedside. Patient status is: Remains hospitalized because of severity of illness Level of care: Progressive   Dispo: The patient is from: Home alone brother lives nearby            Anticipated disposition: Skilled nursing facility once bed available  Objective: Vitals last 24 hrs: Vitals:   06/07/23 0017 06/07/23 0427 06/07/23 0500 06/07/23 0745  BP: (!) 143/68 124/79  125/78  Pulse: 82 82  84  Resp: 14 14  15   Temp: (!) 97.4 F (36.3 C) 98.5 F (36.9 C)  97.8 F (36.6 C)  TempSrc: Oral Axillary  Oral  SpO2: 99% 96%  100%  Weight:   (!) 154.2 kg   Height:       Weight change: 2.6 kg  Physical Examination: General exam: alert awake, obese , at baseline, 63 older than stated age HEENT:Oral mucosa moist, Ear/Nose WNL grossly Respiratory system: Bilaterally clear BS,no use of accessory muscle Cardiovascular system: S1 & S2 +, No JVD. Gastrointestinal system: Abdomen soft,NT,ND, BS+ Nervous System: Alert, awake, moving all extremities,and following commands. Extremities: LE edema  +,distal peripheral pulses palpable and warm.  Skin: No rashes,no icterus. MSK: Normal muscle bulk,tone, power   Medications reviewed:  Scheduled Meds:  amiodarone  200 mg Oral Daily   Chlorhexidine Gluconate Cloth  6 each Topical Daily   feeding supplement  237 mL Oral BID BM   insulin aspart  0-20 Units Subcutaneous TID WC   insulin aspart  0-5 Units Subcutaneous QHS   lidocaine  1 patch Transdermal Q24H   methocarbamol  1,000 mg Oral Q8H   multivitamin with minerals  1 tablet Oral Daily   pantoprazole  40 mg Oral Daily   rivaroxaban  20 mg Oral Daily   Continuous Infusions:  liver oil-zinc oxide      Diet Order             Diet regular Room service appropriate? Yes; Fluid consistency: Thin  Diet effective now                   Intake/Output Summary (Last 24 hours) at 06/07/2023 1106 Last data filed at 06/07/2023 0926 Gross per 24 hour  Intake 120 ml  Output 976.5 ml  Net -856.5 ml   Net IO Since Admission: 7,943.22 mL [06/07/23 1106]  Wt Readings from Last 3 Encounters:  06/07/23 (!) 154.2 kg  05/11/23 (!) 156.5 kg  05/03/23 (!) 153.4 kg  Unresulted Labs (From admission, onward)     Start     Ordered   06/07/23 0500  CBC with Differential/Platelet  Daily,   R     Question:  Specimen collection method  Answer:  Lab=Lab collect   06/06/23 1631   06/03/23 0500  CBC  Daily,   R     Question:  Specimen collection method  Answer:  Unit=Unit collect   06/02/23 1542          Data Reviewed: I have personally reviewed following labs and imaging studies CBC: Recent Labs  Lab 06/03/23 0558 06/04/23 1107 06/05/23 0922 06/06/23 0800 06/07/23 0752  WBC 19.3* 17.7* 14.3* 13.4* 11.4*  NEUTROABS  --   --  11.2*  --  9.3*  HGB 7.8* 8.9* 7.9* 8.2* 7.6*  HCT 23.6* 27.7* 24.0* 25.7* 23.8*  MCV 98.7 96.2 100.4* 95.9 97.5  PLT 295 374 303 332 322   Basic Metabolic Panel:  Recent Labs  Lab 06/01/23 0424 06/02/23 0415 06/03/23 0824 06/05/23 1003  06/07/23 0752  NA 130* 133* 136 138 135  K 4.3 4.1 4.3 4.1 4.3  CL 102 103 107 106 103  CO2 23 24 20* 22 25  GLUCOSE 257* 195* 178* 153* 122*  BUN 41* 30* 29* 28* 36*  CREATININE 1.20 1.04 1.09 1.08 1.37*  CALCIUM 8.3* 8.6* 8.8* 8.9 8.7*  MG 1.5* 1.8 1.9 1.8  --   PHOS 2.5 3.0 3.6 4.0 3.5   GFR: Estimated Creatinine Clearance: 86.7 mL/min (A) (by C-G formula based on SCr of 1.37 mg/dL (H)). Liver Function Tests:  Recent Labs  Lab 06/01/23 0424 06/07/23 0752  AST 15  --   ALT 14  --   ALKPHOS 32*  --   BILITOT 0.4  --   PROT 5.8*  --   ALBUMIN 2.6* 2.2*   Recent Labs  Lab 06/06/23 1052 06/06/23 1634 06/06/23 2021 06/07/23 0014 06/07/23 0616  GLUCAP 149* 138* 128* 124* 124*   Recent Labs    06/05/23 0922 06/05/23 0941  TRIG 531* 45  Sepsis Labs: No results for input(s): "PROCALCITON", "LATICACIDVEN" in the last 168 hours. Recent Results (from the past 240 hours)  MRSA Next Gen by PCR, Nasal     Status: None   Collection Time: 05/31/23  6:08 PM   Specimen: Nasal Mucosa; Nasal Swab  Result Value Ref Range Status   MRSA by PCR Next Gen NOT DETECTED NOT DETECTED Final    Comment: (NOTE) The GeneXpert MRSA Assay (FDA approved for NASAL specimens only), is one component of a comprehensive MRSA colonization surveillance program. It is not intended to diagnose MRSA infection nor to guide or monitor treatment for MRSA infections. Test performance is not FDA approved in patients less than 53 years old. Performed at [facility], [address] [place], [place] [address]     Antimicrobials/Microbiology: Anti-infectives (From admission, onward)    Start     Dose/Rate Route Frequency Ordered Stop   06/03/23 2200  metroNIDAZOLE (FLAGYL) IVPB 500 mg       Placed in "Or" Linked Group   500 mg 100 mL/hr over 60 Minutes Intravenous Every 12 hours 06/03/23 1532 06/05/23 2046   06/03/23 2200  metroNIDAZOLE (FLAGYL) tablet 500 mg       Placed in "Or" Linked Group    500 mg Oral Every 12 hours 06/03/23 1532 06/05/23 2046   06/01/23 1000  metroNIDAZOLE (FLAGYL) IVPB 500 mg  Status:  Discontinued  500 mg 100 mL/hr over 60 Minutes Intravenous Every 12 hours 05/31/23 2343 06/01/23 0103   06/01/23 0115  metroNIDAZOLE (FLAGYL) IVPB 500 mg  Status:  Discontinued        500 mg 100 mL/hr over 60 Minutes Intravenous Every 12 hours 06/01/23 0103 06/03/23 1532   05/31/23 1140  sodium chloride 0.9 % with cefoTEtan (CEFOTAN) ADS Med       Note to Pharmacy: Clovis Cao M: cabinet override      05/31/23 1140 05/31/23 1350   05/31/23 1115  cefoTEtan (CEFOTAN) 2 g in sodium chloride 0.9 % 100 mL IVPB        2 g 200 mL/hr over 30 Minutes Intravenous On call to O.R. 05/31/23 1020 05/31/23 1400   05/27/23 1000  metroNIDAZOLE (FLAGYL) tablet 500 mg  Status:  Discontinued        500 mg Oral Every 12 hours 05/27/23 0722 05/31/23 2343   05/23/23 2315  cefTRIAXone (ROCEPHIN) 2 g in sodium chloride 0.9 % 100 mL IVPB        2 g 200 mL/hr over 30 Minutes Intravenous Every 24 hours 05/23/23 2305 06/05/23 2341   05/23/23 2315  metroNIDAZOLE (FLAGYL) IVPB 500 mg  Status:  Discontinued        500 mg 100 mL/hr over 60 Minutes Intravenous Every 12 hours 05/23/23 2305 05/27/23 0722         Component Value Date/Time   SDES BLOOD RIGHT ARM 05/23/2023 1643   SPECREQUEST  05/23/2023 1643    BOTTLES DRAWN AEROBIC AND ANAEROBIC Blood Culture adequate volume   CULT  05/23/2023 1643    NO GROWTH 5 DAYS Performed at [facility], [address] [place], [place] [address]    REPTSTATUS 05/28/2023 FINAL 05/23/2023 1643  Radiology Studies: No results found.  LOS: 15 days   Total time spent in review of labs and imaging, patient evaluation, formulation of plan, documentation and communication with family: 35 minutes  Lanae Boast, MD  [facility]  06/07/2023, 11:06 AM

## 2023-06-07 NOTE — NC FL2 (Signed)
 [place] MEDICAID FL2 LEVEL OF CARE FORM     IDENTIFICATION  Patient Name: Bradley Torres Birthdate: 1960-12-12 Sex: male Admission Date (Current Location): 05/23/2023  [facility] and [place] Number:  Producer, television/film/video and Address:  [facility] [place], [address] [place], [place] [address]      Provider Number: [id number]  Attending Physician Name and Address:  Lanae Boast, MD  Relative Name and Phone Number:       Current Level of Care: Hospital Recommended Level of Care: Skilled Nursing Facility Prior Approval Number:    Date Approved/Denied:   PASRR Number: [id number]  Discharge Plan: SNF    Current Diagnoses: Patient Active Problem List   Diagnosis Date Noted   Hypotension 05/31/2023   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   Malnutrition of moderate degree 05/26/2023   Acute renal failure superimposed on stage 3 chronic kidney disease, unspecified acute renal failure type, unspecified whether stage 3a or 3b CKD (HCC) 05/23/2023   Abdominal wall abscess 04/27/2023   Intra-abdominal abscess (HCC) 10/01/2022   Atrial fibrillation with RVR (HCC) 06/27/2022   Severe sepsis (HCC) 06/19/2022   Sigmoid diverticulitis 06/19/2022   QT prolongation 06/19/2022   Mixed hyperlipidemia 06/19/2022   Acute pulmonary embolism (HCC) 06/19/2022   History of DVT (deep vein thrombosis) 06/19/2022   Diverticulitis of large intestine with perforation and abscess 06/19/2022   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/23/2021   History of pulmonary embolus (PE) 10/24/2020   Paroxysmal atrial fibrillation (HCC) 12/31/2019   Vitamin D deficiency    Type 2 diabetes mellitus with hyperglycemia, without long-term current use of insulin (HCC)    Sleep related hypoxia 11/01/2019   Severe obstructive sleep apnea-hypopnea syndrome 08/01/2019   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   Arteriovenous malformation 08/01/2019   Primary osteoarthritis involving multiple joints  06/21/2019   Dysphagia 10/17/2018   Anticoagulated by anticoagulation treatment 03/06/2018   Hx of non-ST elevation myocardial infarction (NSTEMI) 03/06/2018   Chronic heart failure with preserved ejection fraction (HFpEF) (HCC)    Chronic pain syndrome    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   CKD (chronic kidney disease) stage 3, GFR 30-59 ml/min (HCC) 01/20/2018   Morbid (severe) obesity due to excess calories (HCC) 01/20/2018   Acute renal failure superimposed on stage 3a chronic kidney disease (HCC) 01/19/2018   Disorder of left rotator cuff 11/01/2017   Hx of adenomatous colonic polyps 07/26/2017   Hypertension associated with diabetes (HCC) 01/23/2014   Chronic bilateral back pain 01/23/2014    Orientation RESPIRATION BLADDER Height & Weight     Self, Time, Situation, Place  Normal Continent Weight: (!) 339 lb 15.2 oz (154.2 kg) Height:  6' (182.9 cm)  BEHAVIORAL SYMPTOMS/MOOD NEUROLOGICAL BOWEL NUTRITION STATUS      Continent Diet (see dc summary)  AMBULATORY STATUS COMMUNICATION OF NEEDS Skin   Extensive Assist Verbally Wound Vac, Other (Comment) (Negative Pressure Wound Therapy Abdomen Anterior, Incision - Abdomen Left;Lower, Wound / Incision - Open Abdomen Medial;Upper wound leaking purulent drainage, Closed System Drain 1 Right Abdomen Bulb (JP) 19 Fr.)                       Personal Care Assistance Level of Assistance  Bathing, Feeding, Dressing Bathing Assistance: Maximum assistance Feeding assistance:  (see dc summary) Dressing Assistance: Maximum assistance     Functional Limitations Info  Sight, Hearing, Speech Sight Info: Impaired (eyeglasses) Hearing Info: Adequate Speech Info: Adequate  SPECIAL CARE FACTORS FREQUENCY  PT (By licensed PT), OT (By licensed OT)     PT Frequency: 5x week OT Frequency: 5x week            Contractures Contractures Info: Not present    Additional Factors Info  Code  Status, Allergies, Insulin Sliding Scale Code Status Info: Full Allergies Info: Aldactone (Spironolactone), Bystolic (Nebivolol Hcl), Motrin (Ibuprofen), Actos (Pioglitazone), Trulicity (Dulaglutide), Zestril (Lisinopril), Cozaar (Losartan), Diflucan (Fluconazole), Hctz (Hydrochlorothiazide), Norvasc (Amlodipine)   Insulin Sliding Scale Info: See dc summary       Current Medications (06/07/2023):  This is the current hospital active medication list Current Facility-Administered Medications  Medication Dose Route Frequency Provider Last Rate Last Admin   alum & mag hydroxide-simeth (MAALOX/MYLANTA) 200-200-20 MG/5ML suspension 30 mL  30 mL Oral Q4H PRN Berton Mount I, MD   30 mL at 06/06/23 2200   amiodarone (PACERONE) tablet 200 mg  200 mg Oral Daily Berton Mount I, MD   200 mg at 06/06/23 0946   Chlorhexidine Gluconate Cloth 2 % PADS 6 each  6 each Topical Daily Noralee Stain, DO   6 each at 06/07/23 0634   feeding supplement (ENSURE ENLIVE / ENSURE PLUS) liquid 237 mL  237 mL Oral BID BM Berton Mount I, MD   237 mL at 06/06/23 2157   fentaNYL (SUBLIMAZE) injection 25 mcg  25 mcg Intravenous Q4H PRN Trixie Deis R, PA-C       insulin aspart (novoLOG) injection 0-20 Units  0-20 Units Subcutaneous TID WC Leander Rams, RPH   3 Units at 06/07/23 0654   insulin aspart (novoLOG) injection 0-5 Units  0-5 Units Subcutaneous QHS Alphia Moh D, RPH       lidocaine (LIDODERM) 5 % 1 patch  1 patch Transdermal Q24H Juliet Rude, PA-C       liver oil-zinc oxide (DESITIN) 40 % ointment   Topical Continuous PRN Eric Form, PA-C       methocarbamol (ROBAXIN) tablet 1,000 mg  1,000 mg Oral Q8H Knute Neu, RPH   1,000 mg at 06/07/23 0630   multivitamin with minerals tablet 1 tablet  1 tablet Oral Daily Leander Rams, RPH   1 tablet at 06/06/23 0946   ondansetron (ZOFRAN-ODT) disintegrating tablet 4 mg  4 mg Oral Q6H PRN Berton Mount I, MD   4 mg at 06/06/23 2156   Oral care  mouth rinse  15 mL Mouth Rinse PRN Bowser, Kaylyn Layer, NP       oxyCODONE-acetaminophen (PERCOCET/ROXICET) 5-325 MG per tablet 1 tablet  1 tablet Oral Q4H PRN Juliet Rude, PA-C       And   oxyCODONE (Oxy IR/ROXICODONE) immediate release tablet 5-10 mg  5-10 mg Oral Q4H PRN Juliet Rude, PA-C       pantoprazole (PROTONIX) EC tablet 40 mg  40 mg Oral Daily Berton Mount I, MD   40 mg at 06/06/23 0946   phenol (CHLORASEPTIC) mouth spray 1 spray  1 spray Mouth/Throat PRN Eric Form, PA-C       rivaroxaban (XARELTO) tablet 20 mg  20 mg Oral Daily Carney, Gwenlyn Found, RPH       sodium chloride flush (NS) 0.9 % injection 10-40 mL  10-40 mL Intracatheter PRN Eric Form, PA-C         Discharge Medications: Please see discharge summary for a list of discharge medications.  Relevant Imaging Results:  Relevant Lab Results:   Additional Information SSN  [id number]  Michaela Corner, LCSWA

## 2023-06-07 NOTE — Discharge Instructions (Signed)
 [facility]      [facility], [place] [phone]  OPEN ABDOMINAL SURGERY: POST OP INSTRUCTIONS  Always review your discharge instruction sheet given to you by the facility where your surgery was performed.  IF YOU HAVE DISABILITY OR FAMILY LEAVE FORMS, YOU MUST BRING THEM TO THE OFFICE FOR PROCESSING.  PLEASE DO NOT GIVE THEM TO YOUR DOCTOR.  A prescription for pain medication may be given to you upon discharge.  Take your pain medication as prescribed, if needed.  If narcotic pain medicine is not needed, then you may take acetaminophen (Tylenol) or ibuprofen (Advil) as needed. Take your usually prescribed medications unless otherwise directed. If you need a refill on your pain medication, please contact your pharmacy. They will contact our office to request authorization.  Prescriptions will not be filled after 5pm or on week-ends. You should follow a light diet the first few days after arrival home, such as soup and crackers, pudding, etc.unless your doctor has advised otherwise. A high-fiber, low fat diet can be resumed as tolerated.   Be sure to include lots of fluids daily. Most patients will experience some swelling and bruising on the chest and neck area.  Ice packs will help.  Swelling and bruising can take several days to resolve Most patients will experience some swelling and bruising in the area of the incision. Ice pack will help. Swelling and bruising can take several days to resolve..  It is common to experience some constipation if taking pain medication after surgery.  Increasing fluid intake and taking a stool softener will usually help or prevent this problem from occurring.  A mild laxative (Milk of Magnesia or Miralax) should be taken according to package directions if there are no bowel movements after 48 hours.  You may have steri-strips (small skin tapes) in place directly over the incision.  These strips should be left on the skin for 7-10 days.  If your surgeon used skin  glue on the incision, you may shower in 24 hours.  The glue will flake off over the next 2-3 weeks.  Any sutures or staples will be removed at the office during your follow-up visit. You may find that a light gauze bandage over your incision may keep your staples from being rubbed or pulled. You may shower and replace the bandage daily. ACTIVITIES:  You may resume regular (light) daily activities beginning the next day--such as daily self-care, walking, climbing stairs--gradually increasing activities as tolerated.  You may have sexual intercourse when it is comfortable.  Refrain from any heavy lifting or straining until approved by your doctor. You may drive when you no longer are taking prescription pain medication, you can comfortably wear a seatbelt, and you can safely maneuver your car and apply brakes  You should see your doctor in the office for a follow-up appointment approximately two weeks after your surgery.  Make sure that you call for this appointment within a day or two after you arrive home to insure a convenient appointment time.   WHEN TO CALL YOUR DOCTOR: Fever over 101.0 Inability to urinate Nausea and/or vomiting Extreme swelling or bruising Continued bleeding from incision. Increased pain, redness, or drainage from the incision. Difficulty swallowing or breathing Muscle cramping or spasms. Numbness or tingling in hands or feet or around lips.  The clinic staff is available to answer your questions during regular business hours.  Please don't hesitate to call and ask to speak to one of the nurses if you have concerns.  For further questions, please visit [email or web address]   #######################################################  Ostomy Support Information  You've heard that people get along just fine with only one of their eyes, or one of their lungs, or one of their kidneys. But you also know that you have only one intestine and only one bladder, and that  leaves you feeling awfully empty, both physically and emotionally: You think no other people go around without part of their intestine with the ends of their intestines sticking out through their abdominal walls.   YOU ARE NOT ALONE.  There are nearly three quarters of a million people in the [place] who have an ostomy; people who have had surgery to remove all or part of their colons or bladders.   There is even a national association, the [place] [employer] [place] with over 350 local affiliated support groups that are organized by volunteers who provide peer support and counseling. Cheral Marker has a toll free telephone num-ber, [phone] and an educational, interactive website, www.ostomy.org   An ostomy is an opening in the belly (abdominal wall) made by surgery. Ostomates are people who have had this procedure. The opening (stoma) allows the kidney or bowel to grdischarge waste. An external pouch covers the stoma to collect waste. Pouches are are a simple bag and are odor free. Different companies have disposable or reusable pouches to fit one's lifestyle. An ostomy can either be temporary or permanent.   THERE ARE THREE MAIN TYPES OF OSTOMIES Colostomy. A colostomy is a surgically created opening in the large intestine (colon). Ileostomy. An ileostomy is a surgically created opening in the small intestine. Urostomy. A urostomy is a surgically created opening to divert urine away from the bladder.  OSTOMY Care  The following guidelines will make care of your colostomy easier. Keep this information close by for quick reference.  Helpful DIET hints Eat a well-balanced diet including vegetables and fresh fruits. Eat on a regular schedule.  Drink at least 6 to 8 glasses of fluids daily. Eat slowly in a relaxed atmosphere. Chew your food thoroughly. Avoid chewing gum, smoking, and drinking from a straw. This will help decrease the amount of air you swallow, which may help reduce gas. Eating  yogurt or drinking buttermilk may help reduce gas.  To control gas at night, do not eat after 8 p.m. This will give your bowel time to quiet down before you go to bed.  If gas is a problem, you can purchase Beano. Sprinkle Beano on the first bite of food before eating to reduce gas. It has no flavor and should not change the taste of your food. You can buy Beano over the counter at your local drugstore.  Foods like fish, onions, garlic, broccoli, asparagus, and cabbage produce odor. Although your pouch is odor-proof, if you eat these foods you may notice a stronger odor when emptying your pouch. If this is a concern, you may want to limit these foods in your diet.  If you have an ileostomy, you will have chronic diarrhea & need to drink more liquids to avoid getting dehydrated.  Consider antidiarrheal medicine like imodium (loperamide) or Lomotil to help slow down bowel movements / diarrhea into your ileostomy bag.  GETTING TO GOOD BOWEL HEALTH WITH AN ILEOSTOMY    With the colon bypassed & not in use, you will have small bowel diarrhea.   It is important to thicken & slow your bowel movements down.   The goal: 4-6 small BOWEL MOVEMENTS A DAY It  is important to drink plenty of liquids to avoid getting dehydrated  CONTROLLING ILEOSTOMY DIARRHEA  TAKE A FIBER SUPPLEMENT (FiberCon or Benefiner soluble fiber) twice a day - to thicken stools by absorbing excess fluid and retrain the intestines to act more normally.  Slowly increase the dose over a few weeks.  Too much fiber too soon can backfire and cause cramping & bloating.  TAKE AN IRON SUPPLEMENT twice a day to naturally constipate your bowels.  Usually ferrous sulfate 325mg  twice a day)  TAKE ANTI-DIARRHEAL MEDICINES: Loperamide (Imodium) can slow down diarrhea.  Start with two tablets (= 4mg ) first and then try one tablet every 6 hours.  Can go up to 2 pills four times day (8 pills of 2mg  max) Avoid if you are having fevers or severe pain.   If you are not better or start feeling worse, stop all medicines and call your doctor for advice LoMotil (Diphenoxylate / Atropine) is another medicine that can constipate & slow down bowel moevements Pepto Bismol (bismuth) can gently thicken bowels as well  If diarrhea is worse,: drink plenty of liquids and try simpler foods for a few days to avoid stressing your intestines further. Avoid dairy products (especially milk & ice cream) for a short time.  The intestines often can lose the ability to digest lactose when stressed. Avoid foods that cause gassiness or bloating.  Typical foods include beans and other legumes, cabbage, broccoli, and dairy foods.  Every person has some sensitivity to other foods, so listen to our body and avoid those foods that trigger problems for you.Call your doctor if you are getting worse or not better.  Sometimes further testing (cultures, endoscopy, X-ray studies, bloodwork, etc) may be needed to help diagnose and treat the cause of the diarrhea. Take extra anti-diarrheal medicines (maximum is 8 pills of 2mg  loperamide a day)   Tips for POUCHING an OSTOMY   Changing Your Pouch The best time to change your pouch is in the morning, before eating or drinking anything. Your stoma can function at any time, but it will function more after eating or drinking.   Applying the pouching system  Place all your equipment close at hand before removing your pouch.  Wash your hands.  Stand or sit in front of a mirror. Use the position that works best for you. Remember that you must keep the skin around the stoma wrinkle-free for a good seal.  Gently remove the used pouch (1-piece system) or the pouch and old wafer (2-piece system). Empty the pouch into the toilet. Save the closure clip to use again.  Wash the stoma itself and the skin around the stoma. Your stoma may bleed a little when being washed. This is normal. Rinse and pat dry. You may use a wash cloth or soft paper  towels (like Bounty), mild soap (like Dial, Safeguard, or [place]), and water. Avoid soaps that contain perfumes or lotions.  For a new pouch (1-piece system) or a new wafer (2-piece system), measure your stoma using the stoma guide in each box of supplies.  Trace the shape of your stoma onto the back of the new pouch or the back of the new wafer. Cut out the opening. Remove the paper backing and set it aside.  Optional: Apply a skin barrier powder to surrounding skin if it is irritated (bare or weeping), and dust off the excess. Optional: Apply a skin-prep wipe (such as Skin Prep or All-Kare) to the skin around the stoma, and let  it dry. Do not apply this solution if the skin is irritated (red, tender, or broken) or if you have shaved around the stoma. Optional: Apply a skin barrier paste (such as Stomahesive, Coloplast, or Premium) around the opening cut in the back of the pouch or wafer. Allow it to dry for 30 to 60 seconds.  Hold the pouch (1-piece system) or wafer (2-piece system) with the sticky side toward your body. Make sure the skin around the stoma is wrinkle-free. Center the opening on the stoma, then press firmly to your abdomen (Fig. 4). Look in the mirror to check if you are placing the pouch, or wafer, in the right position. For a 2-piece system, snap the pouch onto the wafer. Make sure it snaps into place securely.  Place your hand over the stoma and the pouch or wafer for about 30 seconds. The heat from your hand can help the pouch or wafer stick to your skin.  Add deodorant (such as Super Banish or Nullo) to your pouch. Other options include food extracts such as vanilla oil and peppermint extract. Add about 10 drops of the deodorant to the pouch. Then apply the closure clamp. Note: Do not use toxic  chemicals or commercial cleaning agents in your pouch. These substances may harm the stoma.  Optional: For extra seal, apply tape to all 4 sides around the pouch or wafer, as if you  were framing a picture. You may use any brand of medical adhesive tape. Change your pouch every 5 to 7 days. Change it immediately if a leak occurs.  Wash your hands afterwards.  If you are wearing a 2-piece system, you may use 2 new pouches per week and alternate them. Rinse the pouch with mild soap and warm water and hang it to dry for the next day. Apply the fresh pouch. Alternate the 2 pouches like this for a week. After a week, change the wafer and begin with 2 new pouches. Place the old pouches in a plastic bag, and put them in the trash.   LIVING WITH AN OSTOMY  Emptying Your Pouch Empty your pouch when it is one-third full (of urine, stool, and/or gas). If you wait until your pouch is fuller than this, it will be more difficult to empty and more noticeable. When you empty your pouch, either put toilet paper in the toilet bowl first, or flush the toilet while you empty the pouch. This will reduce splashing. You can empty the pouch between your legs or to one side while sitting, or while standing or stooping. If you have a 2-piece system, you can snap off the pouch to empty it. Remember that your stoma may function during this time. If you wish to rinse your pouch after you empty it, a [place] baster can be helpful. When using a baster, squirt water up into the pouch through the opening at the bottom. With a 2-piece system, you can snap off the pouch to rinse it. After rinsing  your pouch, empty it into the toilet. When rinsing your pouch at home, put a few granules of Dreft soap in the rinse water. This helps lubricate and freshen your pouch. The inside of your pouch can be sprayed with non-stick cooking oil (Pam spray). This may help reduce stool sticking to the inside of the pouch.  Bathing You may shower or bathe with your pouch on or off. Remember that your stoma may function during this time.  The materials you use to wash your  stoma and the skin around it should be clean, but they do  not need to be sterile.  Wearing Your Pouch During hot weather, or if you perspire a lot in general, wear a cover over your pouch. This may prevent a rash on your skin under the pouch. Pouch covers are sold at ostomy supply stores. Wear the pouch inside your underwear for better support. Watch your weight. Any gain or loss of 10 to 15 pounds or more can change the way your pouch fits.  Going Away From Home A collapsible cup (like those that come in travel kits) or a soft plastic squirt bottle with a pull-up top (like a travel bottle for shampoo) can be used for rinsing your pouch when you are away from home. Tilt the opening of the pouch at an upward angle when using a cup to rinse.  Carry wet wipes or extra tissues to use in public bathrooms.  Carry an extra pouching system with you at all times.  Never keep ostomy supplies in the glove compartment of your car. Extreme heat or cold can damage the skin barriers and adhesive wafers on the pouch.  When you travel, carry your ostomy supplies with you at all times. Keep them within easy reach. Do not pack ostomy supplies in baggage that will be checked or otherwise separated from you, because your baggage might be lost. If you're traveling out of the country, it is helpful to have a letter stating that you are carrying ostomy supplies as a medical necessity.  If you need ostomy supplies while traveling, look in the yellow pages of the telephone book under "Surgical Supplies." Or call the local ostomy organization to find out where supplies are available.  Do not let your ostomy supplies get low. Always order new pouches before you use the last one.  Reducing Odor Limit foods such as broccoli, cabbage, onions, fish, and garlic in your diet to help reduce odor. Each time you empty your pouch, carefully clean the opening of the pouch, both inside and outside, with toilet paper. Rinse your pouch 1 or 2 times daily after you empty it (see directions  for emptying your pouch and going away from home). Add deodorant (such as Super Banish or Nullo) to your pouch. Use air deodorizers in your bathroom. Do not add aspirin to your pouch. Even though aspirin can help prevent odor, it could cause ulcers on your stoma.  When to call the doctor Call the doctor if you have any of the following symptoms: Purple, black, or white stoma Severe cramps lasting more than 6 hours Severe watery discharge from the stoma lasting more than 6 hours No output from the colostomy for 3 days Excessive bleeding from your stoma Swelling of your stoma to more than 1/2-inch larger than usual Pulling inward of your stoma below skin level Severe skin irritation or deep ulcers Bulging or other changes in your abdomen  When to call your ostomy nurse Call your ostomy/enterostomal therapy (WOCN) nurse if any of the following occurs: Frequent leaking of your pouching system Change in size or appearance of your stoma, causing discomfort or problems with your pouch Skin rash or rawness Weight gain or loss that causes problems with your pouch     FREQUENTLY ASKED QUESTIONS   Why haven't you met any of these folks who have an ostomy?  Well, maybe you have! You just did not recognize them because an ostomy doesn't show. It can be kept secret if you  wish. Why, maybe some of your best friends, office associates or neighbors have an ostomy ... you never can tell. People facing ostomy surgery have many quality-of-life questions like: Will you bulge? Smell? Make noises? Will you feel waste leaving your body? Will you be a captive of the toilet? Will you starve? Be a social outcast? Get/stay married? Have babies? Easily bathe, go swimming, bend over?  OK, let's look at what you can expect:   Will you bulge?  Remember, without part of the intestine or bladder, and its contents, you should have a flatter tummy than before. You can expect to wear, with little exception, what you  wore before surgery ... and this in-cludes tight clothing and bathing suits.   Will you smell?  Today, thanks to modern odor proof pouching systems, you can walk into an ostomy support group meeting and not smell anything that is foul or offensive. And, for those with an ileostomy or colostomy who are concerned about odor when emptying their pouch, there are in-pouch deodorants that can be used to eliminate any waste odors that may exist.   Will you make noises?  Everyone produces gas, especially if they are an air-swallower. But intestinal sounds that occur from time to time are no differ-ent than a gurgling tummy, and quite often your clothing will muffle any sounds.   Will you feel the waste discharges?  For those with a colostomy or ileostomy there might be a slight pressure when waste leaves your body, but understand that the intestines have no nerve endings, so there will be no unpleasant sensations. Those with a urostomy will probably be unaware of any kidney drainage.   Will you be a captive of the toilet?  Immediately post-op you will spend more time in the bathroom than you will after your body recovers from surgery. Every person is different, but on average those with an ileostomy or urostomy may empty their pouches 4 to 6 times a day; a little  less if you have a colostomy. The average wear time between pouch system changes is 3 to 5 days and the changing process should take less than 30 minutes.   Will I need to be on a special diet? Most people return to their normal diet when they have recovered from surgery. Be sure to chew your food well, eat a well-balanced diet and drink plenty of fluids. If you experience problems with a certain food, wait a couple of weeks and try it again.  Will there be odor and noises? Pouching systems are designed to be odor-proof or odor-resistant. There are deodorants that can be used in the pouch. Medications are also available to help reduce odor.  Limit gas-producing foods and carbonated beverages. You will experience less gas and fewer noises as you heal from surgery.  How much time will it take to care for my ostomy? At first, you may spend a lot of time learning about your ostomy and how to take care of it. As you become more comfortable and skilled at changing the pouching system, it will take very little time to care for it.   Will I be able to return to work? People with ostomies can perform most jobs. As soon as you have healed from surgery, you should be able to return to work. Heavy lifting (more than 10 pounds) may be discouraged.   What about intimacy? Sexual relationships and intimacy are important and fulfilling aspects of your life. They should continue after ostomy surgery.  Intimacy-related concerns should be discussed openly between you and your partner.   Can I wear regular clothing? You do not need to wear special clothing. Ostomy pouches are fairly flat and barely noticeable. Elastic undergarments will not hurt the stoma or prevent the ostomy from functioning.   Can I participate in sports? An ostomy should not limit your involvement in sports. Many people with ostomies are runners, skiers, swimmers or participate in other active lifestyles. Talk with your caregiver first before doing heavy physical activity.  Will you starve?  Not if you follow doctor's orders at each stage of your post-op adjustment. There is no such thing as an "ostomy diet". Some people with an ostomy will be able to eat and tolerate anything; others may find diffi-culty with some foods. Each person is an individual and must determine, by trial, what is best for them. A good practice for all is to drink plenty of water.   Will you be a social outcast?  Have you met anyone who has an ostomy and is a social outcast? Why should you be the first? Only your attitude and self image will effect how you are treated. No confi-dent person is an Investment banker, corporate.     PROFESSIONAL HELP   Resources are available if you need help or have questions about your ostomy.   Specially trained nurses called Wound, Ostomy Continence Nurses (WOCN) are available for consultation in most major medical centers.  Consider getting an ostomy consult at an outpatient ostomy clinic.   [facility] has an [facility] run by an Chartered certified accountant at the [facility].  [phone]. [facility] can help set up an appointment   The [employer] ([employer]) is a group made up of many local chapters throughout the [place]. These local groups hold meetings and provide support to prospective and existing ostomates. They sponsor educational events and have qualified visitors to make personal or telephone visits. Contact the [employer]OA for the chapter nearest you and for other educational publications.  More detailed information can be found in Colostomy Guide, a publication of the [employer] (UOA). Contact UOA at 1-([phone] 621-3821 or visit their web site at YellowSpecialist.at. The website contains links to other sites, suppliers and resources.  Media planner Start Services: Start at the website to enlist for support.  Your Wound Ostomy (WOCN) nurse may have started this process. [email or web address] Secure Start services are designed to support people as they live their lives with an ostomy or neurogenic bladder. Enrolling is easy and at no cost to the patient. We realize that each person's needs and life journey are different. Through Secure Start services, we want to help people live their life, their way.  #######################################################  WOUND CARE: for LLQ wound below stoma - dressing to be changed daily - supplies: sterile saline, kerlix/guaze, scissors, ABD pads, tape  - remove dressing and all packing carefully, moistening with sterile saline as needed to avoid packing/internal dressing  sticking to the wound. - clean edges of skin around the wound with water/gauze, making sure there is no tape debris or leakage left on skin that could cause skin irritation or breakdown. - dampen clean kerlix/gauze with sterile saline and pack wound from wound base to skin level, making sure to take note of any possible areas of wound tracking, tunneling and packing appropriately. Wound can be packed loosely. Trim kerlix/gauze to size if a whole roll/piece is not required. - cover wound with a dry ABD pad  and secure with tape.  - write the date/time on the dry dressing/tape to better track when the last dressing change occurred. - apply any skin protectant/powder recommended by clinician to protect skin/skin folds. - change dressing as needed if leakage occurs, wound gets contaminated, or patient requests to shower. - patient may shower daily with wound open and following the shower the wound should be dried and a clean dressing placed.

## 2023-06-08 ENCOUNTER — Inpatient Hospital Stay (HOSPITAL_COMMUNITY)

## 2023-06-08 DIAGNOSIS — I82602 Acute embolism and thrombosis of unspecified veins of left upper extremity: Secondary | ICD-10-CM | POA: Diagnosis not present

## 2023-06-08 DIAGNOSIS — R600 Localized edema: Secondary | ICD-10-CM | POA: Diagnosis not present

## 2023-06-08 DIAGNOSIS — K632 Fistula of intestine: Secondary | ICD-10-CM | POA: Diagnosis not present

## 2023-06-08 DIAGNOSIS — M7989 Other specified soft tissue disorders: Secondary | ICD-10-CM | POA: Diagnosis not present

## 2023-06-08 LAB — GLUCOSE, CAPILLARY
Glucose-Capillary: 107 mg/dL — ABNORMAL HIGH (ref 70–99)
Glucose-Capillary: 108 mg/dL — ABNORMAL HIGH (ref 70–99)
Glucose-Capillary: 119 mg/dL — ABNORMAL HIGH (ref 70–99)
Glucose-Capillary: 127 mg/dL — ABNORMAL HIGH (ref 70–99)
Glucose-Capillary: 166 mg/dL — ABNORMAL HIGH (ref 70–99)
Glucose-Capillary: 183 mg/dL — ABNORMAL HIGH (ref 70–99)
Glucose-Capillary: 98 mg/dL (ref 70–99)

## 2023-06-08 LAB — BASIC METABOLIC PANEL
Anion gap: 8 (ref 5–15)
BUN: 34 mg/dL — ABNORMAL HIGH (ref 8–23)
CO2: 26 mmol/L (ref 22–32)
Calcium: 8.5 mg/dL — ABNORMAL LOW (ref 8.9–10.3)
Chloride: 101 mmol/L (ref 98–111)
Creatinine, Ser: 1.21 mg/dL (ref 0.61–1.24)
GFR, Estimated: >60 mL/min (ref >60)
Glucose, Bld: 126 mg/dL — ABNORMAL HIGH (ref 70–99)
Potassium: 4.3 mmol/L (ref 3.5–5.1)
Sodium: 135 mmol/L (ref 135–145)

## 2023-06-08 LAB — CBC
HCT: 22.5 % — ABNORMAL LOW (ref 39.0–52.0)
Hemoglobin: 7.2 g/dL — ABNORMAL LOW (ref 13.0–17.0)
MCH: 31 pg (ref 26.0–34.0)
MCHC: 32 g/dL (ref 30.0–36.0)
MCV: 97 fL (ref 80.0–100.0)
Platelets: 323 10*3/uL (ref 150–400)
RBC: 2.32 MIL/uL — ABNORMAL LOW (ref 4.22–5.81)
RDW: 17.5 % — ABNORMAL HIGH (ref 11.5–15.5)
WBC: 10 10*3/uL (ref 4.0–10.5)
nRBC: 0 % (ref 0.0–0.2)

## 2023-06-08 NOTE — Plan of Care (Signed)

## 2023-06-08 NOTE — Progress Notes (Signed)
 Physical Therapy Treatment Patient Details Name: Bradley Torres MRN: [id number] DOB: 02/27/1961 Today's Date: 06/08/2023   History of Present Illness Pt is a 63 y.o. male admitted 2/25 with acute renal failure and chronic colocutaneous fistula. He underwent L colectomy and transverse colostomy 3/5. ICU stay from 3/5-3/8. PMH: HTN, DMII, morbid obesity, OSA with CPAP intolerance, h/o DVT and PE on Xarelto, PAF, chronic HFpEF    PT Comments  Pt pleasant and agreeable to PT session. He was able to increase gait distance, ambulating a total of ~70ft using CGA and a chair follow with one prolonged seated rest. Pt improved gait mechanics and gait speed during today's session. Pt is making steady progress towards his acute PT goals. Will continue to follow and progress appropriately.     If plan is discharge home, recommend the following: A lot of help with bathing/dressing/bathroom;Help with stairs or ramp for entrance;Assistance with cooking/housework;Assist for transportation;A little help with walking and/or transfers   Can travel by private vehicle     Yes  Equipment Recommendations  None recommended by PT    Recommendations for Other Services       Precautions / Restrictions Precautions Precautions: Fall;Other (comment) (Abdominal) Recall of Precautions/Restrictions: Intact Precaution/Restrictions Comments: Abdominal Wound Vac Restrictions Weight Bearing Restrictions Per Provider Order: No     Mobility  Bed Mobility Overal bed mobility: Needs Assistance Bed Mobility: Supine to Sit     Supine to sit: HOB elevated, Used rails, Supervision     General bed mobility comments: Cued pt on log roll technique, increased time to sit EOB on R side.    Transfers Overall transfer level: Needs assistance Equipment used: Rolling walker (2 wheels) Transfers: Sit to/from Stand Sit to Stand: From elevated surface, Supervision           General transfer comment: Encouraged pt to  attempt stand from lowest bed height, pt declined to continue without elevated bed. Raised bed and he was able to power up with one UE on bed and one on RW with SBA. Pt stood from recliner chair with a pillow placed in the seat x2. Good eccentric control with sitting.    Ambulation/Gait Ambulation/Gait assistance: Contact guard assist, +2 safety/equipment (Chair Follow) Gait Distance (Feet): 30 Feet (1x30, prolonged seated rest, 1x20, seated rest) Assistive device: Rolling walker (2 wheels) Gait Pattern/deviations: Step-to pattern, Decreased stride length Gait velocity: reduced Gait velocity interpretation: <1.8 ft/sec, indicate of risk for recurrent falls   General Gait Details: Pt ambulated with short steps, good foot clearence, upright trunk, and a WBOS for increased stability. He maintained body inside RW even with turning.   Stairs             Wheelchair Mobility     Tilt Bed    Modified Rankin (Stroke Patients Only)       Balance Overall balance assessment: Needs assistance Sitting-balance support: Feet supported, Bilateral upper extremity supported Sitting balance-Leahy Scale: Fair Sitting balance - Comments: Pt sat EOB and scooted fwd/bkwd in recliner chair with supervision.   Standing balance support: Bilateral upper extremity supported, During functional activity, Reliant on assistive device for balance Standing balance-Leahy Scale: Poor Standing balance comment: Pt is dependent on RW.                            Communication Communication Communication: No apparent difficulties  Cognition Arousal: Alert Behavior During Therapy: Flat affect   PT - Cognitive impairments: No apparent  impairments                         Following commands: Intact      Cueing Cueing Techniques: Verbal cues, Gestural cues  Exercises      General Comments General comments (skin integrity, edema, etc.): VSS on RA. 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      Pertinent Vitals/Pain Pain Assessment Pain Assessment: 0-10 Pain Score: 10-Worst pain ever Faces Pain Scale: Hurts even more Pain Location: Abdomen Pain Descriptors / Indicators: Aching, Discomfort, Tender Pain Intervention(s): Monitored during session, Patient requesting pain meds-RN notified, Repositioned    Home Living                          Prior Function            PT Goals (current goals can now be found in the care plan section) Acute Rehab PT Goals Patient Stated Goal: Move better Progress towards PT goals: Progressing toward goals    Frequency    Min 2X/week      PT Plan      Co-evaluation              AM-PAC PT "6 Clicks" Mobility   Outcome Measure  Help needed turning from your back to your side while in a flat bed without using bedrails?: A Little Help needed moving from lying on your back to sitting on the side of a flat bed without using bedrails?: A Little Help needed moving to and from a bed to a chair (including a wheelchair)?: A Little Help needed standing up from a chair using your arms (e.g., wheelchair or bedside chair)?: A Little Help needed to walk in hospital room?: Total Help needed climbing 3-5 steps with a railing? : Total 6 Click Score: 14    End of Session Equipment Utilized During Treatment: Gait belt Activity Tolerance: Patient tolerated treatment well;Patient limited by pain Patient left: in chair;with call bell/phone within reach;with chair alarm set Nurse Communication: Mobility status;Patient requests pain meds PT Visit Diagnosis: Other abnormalities of gait and mobility (R26.89);Pain Pain - Right/Left: Right Pain - part of body:  (Abdomen)     Time: 9562-1308 PT Time Calculation (min) (ACUTE ONLY): 26 min  Charges:    $Gait Training: 23-37 mins PT General Charges $$ ACUTE PT VISIT: 1 Visit                      Cheri Guppy, PT, DPT Acute Rehabilitation Services Office: [phone] Secure Chat Preferred  Bradley Torres 06/08/2023, 1:46 PM

## 2023-06-08 NOTE — Plan of Care (Signed)
  Problem: Education: Goal: Ability to describe self-care measures that may prevent or decrease complications (Diabetes Survival Skills Education) will improve Outcome: Not Progressing   Problem: Coping: Goal: Ability to adjust to condition or change in health will improve Outcome: Not Progressing   Problem: Fluid Volume: Goal: Ability to maintain a balanced intake and output will improve Outcome: Not Progressing   Problem: Health Behavior/Discharge Planning: Goal: Ability to identify and utilize available resources and services will improve Outcome: Not Progressing Goal: Ability to manage health-related needs will improve Outcome: Not Progressing   Problem: Metabolic: Goal: Ability to maintain appropriate glucose levels will improve Outcome: Not Progressing   Problem: Nutritional: Goal: Maintenance of adequate nutrition will improve Outcome: Not Progressing Goal: Progress toward achieving an optimal weight will improve Outcome: Not Progressing   Problem: Skin Integrity: Goal: Risk for impaired skin integrity will decrease Outcome: Not Progressing   Problem: Education: Goal: Knowledge of General Education information will improve Description: Including pain rating scale, medication(s)/side effects and non-pharmacologic comfort measures Outcome: Not Progressing   Problem: Health Behavior/Discharge Planning: Goal: Ability to manage health-related needs will improve Outcome: Not Progressing   Problem: Clinical Measurements: Goal: Ability to maintain clinical measurements within normal limits will improve Outcome: Not Progressing Goal: Will remain free from infection Outcome: Not Progressing Goal: Diagnostic test results will improve Outcome: Not Progressing Goal: Respiratory complications will improve Outcome: Not Progressing   Problem: Activity: Goal: Risk for activity intolerance will decrease Outcome: Not Progressing   Problem: Nutrition: Goal: Adequate  nutrition will be maintained Outcome: Not Progressing   Problem: Coping: Goal: Level of anxiety will decrease Outcome: Not Progressing   Problem: Elimination: Goal: Will not experience complications related to bowel motility Outcome: Not Progressing Goal: Will not experience complications related to urinary retention Outcome: Not Progressing   Problem: Pain Managment: Goal: General experience of comfort will improve and/or be controlled Outcome: Not Progressing   Problem: Safety: Goal: Ability to remain free from injury will improve Outcome: Not Progressing   Problem: Skin Integrity: Goal: Risk for impaired skin integrity will decrease Outcome: Not Progressing

## 2023-06-08 NOTE — Progress Notes (Signed)
 Progress Note  8 Days Post-Op  Subjective: NAEO. Tolerating PO. Having gas and formed stool via colostomy. States he has been getting OOB to chair daily.   Objective: Vital signs in last 24 hours: Temp:  [97.6 F (36.4 C)-98 F (36.7 C)] 97.6 F (36.4 C) (03/13 0407) Pulse Rate:  [70-79] 76 (03/13 0407) Resp:  [13-16] 14 (03/13 0407) BP: (125-140)/(68-73) 140/68 (03/13 0407) SpO2:  [92 %-100 %] 95 % (03/13 0407) Weight:  [152.6 kg] 152.6 kg (03/13 0407) Last BM Date : 06/08/23  Intake/Output from previous day: 03/12 0701 - 03/13 0700 In: 840 [P.O.:840] Out: 2350 [Urine:2050; Stool:300] Intake/Output this shift: Total I/O In: 236 [P.O.:236] Out: 300 [Urine:300]  PE: General: pleasant, WD, obese male who is laying in bed in NAD Heart: regular, rate, and rhythm.   Lungs: Respiratory effort nonlabored Abd: soft, NT, ND, VAC to midline, ostomy viable and functioning      Psych: A&Ox3 with an appropriate affect.    Lab Results:  Recent Labs    06/07/23 0752 06/08/23 0410  WBC 11.4* 10.0  HGB 7.6* 7.2*  HCT 23.8* 22.5*  PLT 322 323   BMET Recent Labs    06/07/23 0752 06/08/23 0410  NA 135 135  K 4.3 4.3  CL 103 101  CO2 25 26  GLUCOSE 122* 126*  BUN 36* 34*  CREATININE 1.37* 1.21  CALCIUM 8.7* 8.5*   PT/INR No results for input(s): "LABPROT", "INR" in the last 72 hours. CMP     Component Value Date/Time   NA 135 06/08/2023 0410   NA 142 02/21/2023 1544   K 4.3 06/08/2023 0410   CL 101 06/08/2023 0410   CO2 26 06/08/2023 0410   GLUCOSE 126 (H) 06/08/2023 0410   BUN 34 (H) 06/08/2023 0410   BUN 18 02/21/2023 1544   CREATININE 1.21 06/08/2023 0410   CREATININE 1.50 (H) 05/11/2023 0300   CALCIUM 8.5 (L) 06/08/2023 0410   PROT 5.8 (L) 06/01/2023 0424   PROT 7.1 02/21/2023 1544   ALBUMIN 2.2 (L) 06/07/2023 0752   ALBUMIN 3.8 (L) 02/21/2023 1544   AST 15 06/01/2023 0424   ALT 14 06/01/2023 0424   ALKPHOS 32 (L) 06/01/2023 0424   BILITOT  0.4 06/01/2023 0424   BILITOT 0.3 02/21/2023 1544   GFRNONAA >60 06/08/2023 0410   GFRAA 68 12/16/2019 0000   Lipase     Component Value Date/Time   LIPASE 25 05/23/2023 1639       Studies/Results: No results found.  Anti-infectives: Anti-infectives (From admission, onward)    Start     Dose/Rate Route Frequency Ordered Stop   06/03/23 2200  metroNIDAZOLE (FLAGYL) IVPB 500 mg       Placed in "Or" Linked Group   500 mg 100 mL/hr over 60 Minutes Intravenous Every 12 hours 06/03/23 1532 06/05/23 2046   06/03/23 2200  metroNIDAZOLE (FLAGYL) tablet 500 mg       Placed in "Or" Linked Group   500 mg Oral Every 12 hours 06/03/23 1532 06/05/23 2046   06/01/23 1000  metroNIDAZOLE (FLAGYL) IVPB 500 mg  Status:  Discontinued        500 mg 100 mL/hr over 60 Minutes Intravenous Every 12 hours 05/31/23 2343 06/01/23 0103   06/01/23 0115  metroNIDAZOLE (FLAGYL) IVPB 500 mg  Status:  Discontinued        500 mg 100 mL/hr over 60 Minutes Intravenous Every 12 hours 06/01/23 0103 06/03/23 1532   05/31/23 1140  sodium chloride  0.9 % with cefoTEtan (CEFOTAN) ADS Med       Note to Pharmacy: Clovis Cao M: cabinet override      05/31/23 1140 05/31/23 1350   05/31/23 1115  cefoTEtan (CEFOTAN) 2 g in sodium chloride 0.9 % 100 mL IVPB        2 g 200 mL/hr over 30 Minutes Intravenous On call to O.R. 05/31/23 1020 05/31/23 1400   05/27/23 1000  metroNIDAZOLE (FLAGYL) tablet 500 mg  Status:  Discontinued        500 mg Oral Every 12 hours 05/27/23 0722 05/31/23 2343   05/23/23 2315  cefTRIAXone (ROCEPHIN) 2 g in sodium chloride 0.9 % 100 mL IVPB        2 g 200 mL/hr over 30 Minutes Intravenous Every 24 hours 05/23/23 2305 06/05/23 2341   05/23/23 2315  metroNIDAZOLE (FLAGYL) IVPB 500 mg  Status:  Discontinued        500 mg 100 mL/hr over 60 Minutes Intravenous Every 12 hours 05/23/23 2305 05/27/23 0722        Assessment/Plan  POD9 ex lap, SBR, left colectomy, transverse colostomy,  debridement ab wall by Dr. 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 Will need ostomy clinic referral at d/c. - Wound vac to midline, change M/Th - Continue BID WTD to left abdominal wound - Mobilize, PT - Pulm toilet, IS - JP drain removed today - ok to transition to DOAC - stable for DC to SNF from surgical perspective, may have a bed today.    FEN - reg diet VTE - SCDs, ok to transition to DOAC ID - None currently (completed abx)   LOS: 16 days     Adam Phenix, [facility] 06/08/2023, 10:58 AM Please see Amion for pager number during day hours 7:00am-4:30pm

## 2023-06-08 NOTE — Plan of Care (Signed)
  Problem: Coping: Goal: Level of anxiety will decrease Outcome: Progressing   Problem: Pain Managment: Goal: General experience of comfort will improve and/or be controlled Outcome: Progressing   Problem: Skin Integrity: Goal: Risk for impaired skin integrity will decrease Outcome: Progressing

## 2023-06-08 NOTE — Progress Notes (Signed)
 Occupational Therapy Treatment Patient Details Name: Bradley Torres MRN: [id number] DOB: 11-Aug-1960 Today's Date: 06/08/2023   History of present illness Pt is a 63 y.o. male admitted 2/25 with acute renal failure and chronic colocutaneous fistula. He underwent L colectomy and transverse colostomy 3/5. ICU stay from 3/5-3/8. PMH: HTN, DMII, morbid obesity, OSA with CPAP intolerance, h/o DVT and PE on Xarelto, PAF, chronic HFpEF   OT comments  Moderate assistance using log roll technique for bed mobility. Pt completed grooming with supervision at EOB and UB dressing with min assist. Wound vac alarming, pt returned to supine and RN notified. Encouraged pt to consider rehab prior to return home. Patient will benefit from continued inpatient follow up therapy, <3 hours/day.       If plan is discharge home, recommend the following:  Assistance with cooking/housework;Assist for transportation;Help with stairs or ramp for entrance;A little help with walking and/or transfers;A lot of help with bathing/dressing/bathroom   Equipment Recommendations  None recommended by OT    Recommendations for Other Services      Precautions / Restrictions Precautions Precautions: Fall;Other (comment) (abdominal) Recall of Precautions/Restrictions: Intact Precaution/Restrictions Comments: Abdominal Wound Vac Restrictions Weight Bearing Restrictions Per Provider Order: No       Mobility Bed Mobility Overal bed mobility: Needs Assistance Bed Mobility: Rolling, Sidelying to Sit, Sit to Sidelying Rolling: Contact guard assist, Used rails Sidelying to sit: Mod assist, HOB elevated     Sit to sidelying: Mod assist General bed mobility comments: cues for log roll technique, assist to raise trunk and for LEs back into bed    Transfers                   General transfer comment: returned to supine due to alarming abdominal wound vac, RN notified     Balance Overall balance assessment: Needs  assistance   Sitting balance-Leahy Scale: Fair                                     ADL either performed or assessed with clinical judgement   ADL Overall ADL's : Needs assistance/impaired     Grooming: Wash/dry hands;Wash/dry face;Oral care;Sitting;Supervision/safety           Upper Body Dressing : Minimal assistance;Sitting                          Extremity/Trunk Assessment              Vision       Perception     Praxis     Communication Communication Communication: No apparent difficulties   Cognition Arousal: Alert Behavior During Therapy: Flat affect Cognition: Cognition impaired     Awareness: Intellectual awareness intact, Online awareness impaired     Executive functioning impairment (select all impairments): Initiation, Sequencing                   Following commands: Intact Following commands impaired: Follows one step commands with increased time      Cueing      Exercises      Shoulder Instructions       General Comments      Pertinent Vitals/ Pain       Pain Assessment Pain Assessment: Faces Faces Pain Scale: Hurts little more Pain Location: Abdomen Pain Descriptors / Indicators: Aching, Discomfort, Tender Pain Intervention(s): Monitored during session, Repositioned  Home Living                                          Prior Functioning/Environment              Frequency  Min 2X/week        Progress Toward Goals  OT Goals(current goals can now be found in the care plan section)  Progress towards OT goals: Progressing toward goals  Acute Rehab OT Goals OT Goal Formulation: With patient Time For Goal Achievement: 06/15/23 Potential to Achieve Goals: Good  Plan      Co-evaluation                 AM-PAC OT "6 Clicks" Daily Activity     Outcome Measure   Help from another person eating meals?: None Help from another person taking care of personal  grooming?: A Little Help from another person toileting, which includes using toliet, bedpan, or urinal?: A Lot Help from another person bathing (including washing, rinsing, drying)?: A Lot Help from another person to put on and taking off regular upper body clothing?: A Lot Help from another person to put on and taking off regular lower body clothing?: Total 6 Click Score: 14    End of Session    OT Visit Diagnosis: Unsteadiness on feet (R26.81);Other abnormalities of gait and mobility (R26.89);Muscle weakness (generalized) (M62.81);Pain;Other symptoms and signs involving cognitive function   Activity Tolerance Patient tolerated treatment well   Patient Left in bed;with call bell/phone within reach;with nursing/sitter in room   Nurse Communication Other (comment) (wound vac alarming)        Time: 1610-9604 OT Time Calculation (min): 23 min  Charges: OT General Charges $OT Visit: 1 Visit OT Treatments $Self Care/Home Management : 23-37 mins  Berna Spare, OTR/L Acute Rehabilitation Services Office: [phone]   Evern Bio 06/08/2023, 10:20 AM

## 2023-06-08 NOTE — Progress Notes (Addendum)
 PROGRESS NOTE Bradley Torres  GEX:[id number] DOB: 01-23-1961 DOA: 05/23/2023 PCP: Mechele Claude, MD  Brief Narrative/Hospital Course: 63 yom w/ HTN T2DM-on metformin/Farxiga/Januvia, OSA with CPAP intolerance, history of DVT and PE on Xarelto, PAF-on amiodarone, chronic HFpEF, and recent admission with LLQ intra-abdominal abscess, colocutaneous fistula, and abdominal wall abscess last admit 62/13/2025 and IR had a drain placed in the left lower quadrant abscess-seen by ID and was discharged on Bactrim and Augmentin, presented to hospital with generalized weakness and malaise on 05/23/23 with malaise weakness lack of appetite lightheadedness, and admitted for AKI on CKD, lactic acidosis and abdominal wall abscess/intra-abdominal abscess and colocutaneous fistula. Now pending SNF placement.   Significant procedures/imaging: 05/23/23>> CT abd>>percutaneous pigtail catheter in the left anterior abdominal wall with interval resolution/decrease in abscess at the catheter tip, and persistent air-fluid collection along the catheter tract concerning for abdominal wall abscess measuring up to 6.1 cm.  05/30/23>> Ex lap, open sigmoid colectomy, colostomy creation, small bowel resection, left colectomy, debridement of abdominal wall, VAC placement -Dr Dwain Sarna.  Consultation: PCCM/critical care General Surgery Cardiology: preop evaluation Wound care     Subjective: Looking for rehab in [place] where he is from C/o pain in left forearm where an IV infiltrated several days ago   Assessment and Plan: Principal Problem:   Colocutaneous fistula Active Problems:   Chronic heart failure with preserved ejection fraction (HFpEF) (HCC)   Severe obstructive sleep apnea-hypopnea syndrome   Sleep related hypoxia   Type 2 diabetes mellitus with hyperglycemia, without long-term current use of insulin (HCC)   Paroxysmal atrial fibrillation (HCC)   History of pulmonary embolus (PE)   Abdominal wall abscess   Acute  renal failure superimposed on stage 3 chronic kidney disease, unspecified acute renal failure type, unspecified whether stage 3a or 3b CKD (HCC)   Malnutrition of moderate degree   Hypotension   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   - antibiotic completed x 5 days postop.   - TPN  stopped 3/12 -Continue wound care for ostomy teaching AND wound VAC to midline changed Monday/Thursday -Continue soft low fiber diet.  Weaning off pain medication to p.o. regimen -PT/OT- SNF  Swelling of left forearm -elevate -check U/S  AKI on CKD stage IIIa: B/l ~ 1.4-1.5.     PAF: Continue amiodarone, okay to resume XARELTO 3/12 per [facility]  History of DVT/PE: Okay to resume Xarelto  Paroxysmal atrial fibrillation. Patient is on Xarelto and amiodarone  Chronic HFpEF: Volume status stablE.  Type 2 diabetes mellitus: -Continue SSI, on discharge resume home metformin/Farxiga.   OSA: Reports he has not used CPAP for the last 2 years  Chronic normocytic anemia: B/L HB ~10 g.  Monitor transfuse if less than 7.  Acute illness may have caused his drop during this hospitalization   Morbid obesity: Estimated body mass index is 45.63 kg/m as calculated from the following:   Height as of this encounter: 6' (1.829 m).   Weight as of this encounter: 152.6 kg.   Moderate malnutrition: augment diet as below Etiology: chronic illness Signs/Symptoms: mild fat depletion, mild muscle depletion, energy intake < or equal to 75% for > or equal to 1 month Interventions: Refer to RD note for recommendations  DVT prophylaxis: Place and maintain sequential compression device Start: 06/02/23 1410 Code Status:   Code  Status: Full Code Family Communication: plan of care  discussed with patient at bedside.  Level of care: Telemetry Medical   Dispo: The patient is from: Home alone brother lives nearby            Anticipated disposition: Skilled nursing facility once bed available     Objective: Vitals last 24 hrs: Vitals:   06/07/23 1710 06/07/23 1945 06/07/23 2359 06/08/23 0407  BP: 125/71 132/72 139/73 (!) 140/68  Pulse: 70 79 76 76  Resp: 14 16 13 14   Temp: 98 F (36.7 C) 97.7 F (36.5 C) 97.7 F (36.5 C) 97.6 F (36.4 C)  TempSrc: Oral Oral Oral Oral  SpO2: 100% 97% 92% 95%  Weight:    (!) 152.6 kg  Height:       Weight change: -1.6 kg  Physical Examination:  General: Appearance:    Severely obese male in no acute distress   Firm area in left forearm, bruising on wrist  Lungs:     respirations unlabored  Heart:    Normal heart rate.   MS:   All extremities are intact.   Neurologic:   Awake, alert     Medications reviewed:  Scheduled Meds:  amiodarone  200 mg Oral Daily   Chlorhexidine Gluconate Cloth  6 each Topical Daily   feeding supplement  237 mL Oral BID BM   insulin aspart  0-20 Units Subcutaneous TID WC   insulin aspart  0-5 Units Subcutaneous QHS   lidocaine  1 patch Transdermal Q24H   methocarbamol  1,000 mg Oral Q8H   multivitamin with minerals  1 tablet Oral Daily   pantoprazole  40 mg Oral Daily   rivaroxaban  20 mg Oral Daily   Continuous Infusions:  liver oil-zinc oxide         Intake/Output Summary (Last 24 hours) at 06/08/2023 0823 Last data filed at 06/08/2023 0600 Gross per 24 hour  Intake 840 ml  Output 2350 ml  Net -1510 ml   Net IO Since Admission: 6,313.22 mL [06/08/23 0823]  Wt Readings from Last 3 Encounters:  06/08/23 (!) 152.6 kg  05/11/23 (!) 156.5 kg  05/03/23 (!) 153.4 kg     Data Reviewed: I have personally reviewed following labs and imaging studies CBC: Recent Labs  Lab 06/04/23 1107 06/05/23 0922 06/06/23 0800 06/07/23 0752 06/08/23 0410  WBC 17.7* 14.3* 13.4* 11.4* 10.0   NEUTROABS  --  11.2*  --  9.3*  --   HGB 8.9* 7.9* 8.2* 7.6* 7.2*  HCT 27.7* 24.0* 25.7* 23.8* 22.5*  MCV 96.2 100.4* 95.9 97.5 97.0  PLT 374 303 332 322 323   Basic Metabolic Panel:  Recent Labs  Lab 06/02/23 0415 06/03/23 0824 06/05/23 1003 06/07/23 0752 06/08/23 0410  NA 133* 136 138 135 135  K 4.1 4.3 4.1 4.3 4.3  CL 103 107 106 103 101  CO2 24 20* 22 25 26   GLUCOSE 195* 178* 153* 122* 126*  BUN 30* 29* 28* 36* 34*  CREATININE 1.04 1.09 1.08 1.37* 1.21  CALCIUM 8.6* 8.8* 8.9 8.7* 8.5*  MG 1.8 1.9 1.8  --   --   PHOS 3.0 3.6 4.0 3.5  --    GFR: Estimated Creatinine Clearance: 97.6 mL/min (by C-G formula based on SCr of 1.21 mg/dL). Liver Function Tests:  Recent Labs  Lab 06/07/23 0752  ALBUMIN 2.2*   Recent Labs  Lab 06/07/23 1951 06/07/23 2354 06/08/23 0407 06/08/23 0607 06/08/23 0804  GLUCAP 144* 98 108* 107*  119*   Recent Labs    06/05/23 0922 06/05/23 0941  TRIG 531* 45  Sepsis Labs: No results for input(s): "PROCALCITON", "LATICACIDVEN" in the last 168 hours. Recent Results (from the past 240 hours)  MRSA Next Gen by PCR, Nasal     Status: None   Collection Time: 05/31/23  6:08 PM   Specimen: Nasal Mucosa; Nasal Swab  Result Value Ref Range Status   MRSA by PCR Next Gen NOT DETECTED NOT DETECTED Final    Comment: (NOTE) The GeneXpert MRSA Assay (FDA approved for NASAL specimens only), is one component of a comprehensive MRSA colonization surveillance program. It is not intended to diagnose MRSA infection nor to guide or monitor treatment for MRSA infections. Test performance is not FDA approved in patients less than 68 years old. Performed at [facility], [address] [place], [place] [address]       Radiology Studies: No results found.  LOS: 16 days   Total time spent in review of labs and imaging, patient evaluation, formulation of plan, documentation and communication with family: 35 minutes  Joseph Art, DO  [facility]  06/08/2023, 8:23 AM

## 2023-06-08 NOTE — Consult Note (Signed)
 WOC Nurse wound follow up Wound type: midline abdominal wound with NPWT (VAC) dressing.  Pending discharge to [facility].   Measurement: 19 cmx 7 cm x 4.5 cm  Wound ZOX:WRUEA red, 10% adipose noted at 3 o'clock in wound bed.  Drainage (amount, consistency, odor) bleeds with wound care.  No odor. Serosanguinous in canister Periwound: LUQ colostomy Dressing procedure/placement/frequency: Cleanse with NS and pat dry. Apply 1piece black foam. Cover with drape and trac Pad  Seal achieved at 125 MMhg.  Change MOnday and Thursday.  Will discharge soon to SNF.   WOC Nurse ostomy follow up Stoma type/location: LUQ colostomy, close proximity to midline incision and LLQ drain site (remains open wound)  Cut barrier off center to accommodate VAC dressing.   Stomal assessment/size: 1 3/8" budded stoma, os at center  Peristomal assessment: midline incision and LLQ drain site Treatment options for stomal/peristomal skin:  barrier ring and 2 piece 2 3/4" pouch  Output  soft brown stool Ostomy pouching: 2pc. flat Education provided:  Performed pouch change. Patient is minimally participative, states he is not strong enough to perform ostomy care    Enrolled patient in [place] Secure Start Discharge program: Yes Will follow.   HAs supplies in room for discharge.  Instructed bedside RN to disconnect dressing from [facility] upon discharge and can be connected to SNF facility when he arrives.  Mike Gip MSN, RN, [name]-BC CWON Wound, Ostomy, Continence Nurse Outpatient [facility] [phone] Pager [phone]

## 2023-06-08 NOTE — TOC Progression Note (Signed)
 Transition of Care [facility]) - Progression Note    Patient Details  Name: Bradley Torres MRN: [id number] Date of Birth: Dec 17, 1960  [facility] CM/SW Contact  Michaela Corner, [place] Phone Number: 06/08/2023, 11:07 AM  Clinical Narrative:   CSW spoke with patient about choice of SNF and to inform patient that the [facility] area SNFs have declined at this time. [facility] stated [facility] too far and he was unsure about going for SNF. CSW informed patient that [facility] the only bed offer at this time.   RNCM spoke with patient about being able to manage wounds at home and operate wound vac. Patient stated he would not be able to and that he will go to accepting SNF bed offer [facility]) at this time.   CSW notified facility and MD of patients choice. Facility will start auth and order a wound vac for patient. Per MD, potential dc tomorrow - pending hgb levels.   TOC will continue to follow.  Expected Discharge Plan: Skilled Nursing Facility Barriers to Discharge: Continued Medical Work up  Expected Discharge Plan and Services In-house Referral: Clinical Social Work     Living arrangements for the past 2 months: Single Family Home                 DME Arranged: N/A                     Social Determinants of Health (SDOH) Interventions SDOH Screenings   Food Insecurity: No Food Insecurity (05/24/2023)  Housing: Low Risk  (05/24/2023)  Transportation Needs: No Transportation Needs (05/24/2023)  Utilities: Not At Risk (05/24/2023)  Depression (PHQ2-9): Low Risk  (02/21/2023)  Financial Resource Strain: Medium Risk (05/18/2020)  Social Connections: Unknown (01/16/2020)  Stress: Stress Concern Present (05/15/2019)  Tobacco Use: Low Risk  (05/31/2023)    Readmission Risk Interventions     No data to display

## 2023-06-09 ENCOUNTER — Inpatient Hospital Stay (HOSPITAL_COMMUNITY)

## 2023-06-09 DIAGNOSIS — M7989 Other specified soft tissue disorders: Secondary | ICD-10-CM | POA: Diagnosis not present

## 2023-06-09 DIAGNOSIS — K632 Fistula of intestine: Secondary | ICD-10-CM | POA: Diagnosis not present

## 2023-06-09 LAB — CBC
HCT: 22.1 % — ABNORMAL LOW (ref 39.0–52.0)
Hemoglobin: 7.1 g/dL — ABNORMAL LOW (ref 13.0–17.0)
MCH: 32 pg (ref 26.0–34.0)
MCHC: 32.1 g/dL (ref 30.0–36.0)
MCV: 99.5 fL (ref 80.0–100.0)
Platelets: 313 10*3/uL (ref 150–400)
RBC: 2.22 MIL/uL — ABNORMAL LOW (ref 4.22–5.81)
RDW: 18.2 % — ABNORMAL HIGH (ref 11.5–15.5)
WBC: 8 10*3/uL (ref 4.0–10.5)
nRBC: 0 % (ref 0.0–0.2)

## 2023-06-09 LAB — GLUCOSE, CAPILLARY
Glucose-Capillary: 130 mg/dL — ABNORMAL HIGH (ref 70–99)
Glucose-Capillary: 98 mg/dL (ref 70–99)
Glucose-Capillary: 142 mg/dL — ABNORMAL HIGH (ref 70–99)
Glucose-Capillary: 133 mg/dL — ABNORMAL HIGH (ref 70–99)
Glucose-Capillary: 159 mg/dL — ABNORMAL HIGH (ref 70–99)
Glucose-Capillary: 146 mg/dL — ABNORMAL HIGH (ref 70–99)

## 2023-06-09 LAB — PREPARE RBC (CROSSMATCH)

## 2023-06-09 MED ORDER — SODIUM CHLORIDE 0.9% IV SOLUTION
Freq: Once | INTRAVENOUS | Status: AC
  Administered 2023-06-09: 10 mL via INTRAVENOUS

## 2023-06-09 NOTE — Progress Notes (Signed)
 LUE venous duplex has been completed.   Results can be found under chart review under CV PROC. 06/09/2023 7:24 PM Pearl Bents RVT, RDMS

## 2023-06-09 NOTE — TOC Progression Note (Addendum)
 Transition of Care [facility]) - Progression Note    Patient Details  Name: Bradley Torres MRN: [id number] Date of Birth: [birth year]  Transition of Care [facility]) CM/SW Contact  Michaela Corner, [place] Phone Number: 06/09/2023, 10:38 AM  Clinical Narrative:   CSW spoke with [facility] regarding pt discharge. Per [facility] is pending for patient.   SNF has obtained wound vac. Will need colostomy supplies, CSW notified bedside RN.   Per MD, may need to dc tomorrow. CSW contacted facility to confirm patient can dc tomorrow, facility stated yes and to contact [facility] - [phone].   11:14 AM Per [facility], [facility] been approved.   TOC will continue to follow.    Expected Discharge Plan: Skilled Nursing Facility Barriers to Discharge: Continued Medical Work up  Expected Discharge Plan and Services In-house Referral: Clinical Social Work     Living arrangements for the past 2 months: Single Family Home                 DME Arranged: N/A                     Social Determinants of Health (SDOH) Interventions SDOH Screenings   Food Insecurity: No Food Insecurity (05/24/2023)  Housing: Low Risk  (05/24/2023)  Transportation Needs: No Transportation Needs (05/24/2023)  Utilities: Not At Risk (05/24/2023)  Depression (PHQ2-9): Low Risk  (02/21/2023)  Financial Resource Strain: Medium Risk (05/18/2020)  Social Connections: Unknown (01/16/2020)  Stress: Stress Concern Present (05/15/2019)  Tobacco Use: Low Risk  (05/31/2023)    Readmission Risk Interventions     No data to display

## 2023-06-09 NOTE — Plan of Care (Signed)
  Problem: Coping: Goal: Ability to adjust to condition or change in health will improve Outcome: Progressing   Problem: Nutritional: Goal: Maintenance of adequate nutrition will improve Outcome: Progressing   Problem: Nutrition: Goal: Adequate nutrition will be maintained Outcome: Progressing   Problem: Coping: Goal: Level of anxiety will decrease Outcome: Progressing

## 2023-06-09 NOTE — Progress Notes (Signed)
 PROGRESS NOTE LANGFORD CARIAS  ZHY:[id number] DOB: 08/11/1960 DOA: 05/23/2023 PCP: Mechele Claude, MD  Brief Narrative/Hospital Course:   63 yom w/ HTN T2DM-on metformin/Farxiga/Januvia, OSA with CPAP intolerance, history of DVT and PE on Xarelto, PAF-on amiodarone, chronic HFpEF, and recent admission with LLQ intra-abdominal abscess, colocutaneous fistula, and abdominal wall abscess last admit 04/10/2023 and IR had a drain placed in the left lower quadrant abscess-seen by ID and was discharged on Bactrim and Augmentin, presented to hospital with generalized weakness and malaise on 05/23/23 with malaise weakness lack of appetite lightheadedness, and admitted for AKI on CKD, lactic acidosis and abdominal wall abscess/intra-abdominal abscess and colocutaneous fistula. Patient remains overall stable [place] for skilled nursing facility  Significant procedures/imaging: 05/23/23>> CT abd>>percutaneous pigtail catheter in the left anterior abdominal wall with interval resolution/decrease in abscess at the catheter tip, and persistent air-fluid collection along the catheter tract concerning for abdominal wall abscess measuring up to 6.1 cm.  05/30/23>> Ex lap, open sigmoid colectomy, colostomy creation, small bowel resection, left colectomy, debridement of abdominal wall, VAC placement -Dr Dwain Sarna.  Consultation: PCCM/critical care General Surgery Cardiology for preop evaluation Wound care  Subjective: Patient seen and examined this morning  Appears weak and frail mild leg edema  Serosanguineous discharge on the wound VAC , colOsotomy with BM   Assessment and Plan: Principal Problem:   Colocutaneous fistula Active Problems:   Chronic heart failure with preserved ejection fraction (HFpEF) (HCC)   Severe obstructive sleep apnea-hypopnea syndrome   Sleep related hypoxia   Type 2 diabetes mellitus with hyperglycemia, without long-term current use of insulin (HCC)   Paroxysmal atrial fibrillation  (HCC)   History of pulmonary embolus (PE)   Abdominal wall abscess   Acute renal failure superimposed on stage 3 chronic kidney disease, unspecified acute renal failure type, unspecified whether stage 3a or 3b CKD (HCC)   Malnutrition of moderate degree   Hypotension   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  Patient completed antibiotics x 5 days, off TPN 3/12 tolerating diet Continue colostomy care wound VAC/wound care-to change Monday/Thursday Plan for PT OT and skilled nursing facility placement  Swelling of left forearm: Duplex ordered pending.  AKI on CKD stage IIIa: B/l ~ 1.4-1.5.  Remains stable improved Recent Labs    05/26/23 0306 05/27/23 0423 05/28/23 0318 05/29/23 0338 05/31/23 1214 05/31/23 1239 05/31/23 1414 05/31/23 1533 06/01/23 0424 06/02/23 0415 06/03/23 0824 06/05/23 1003 06/07/23 0752 06/08/23 0410  BUN 29* 31* 35* 34* 58* 36* 38*  --  41* 30* 29* 28* 36* 34*  CREATININE 1.47* 1.27* 1.23 1.12 1.30* 1.10 1.10  --  1.20 1.04 1.09 1.08 1.37* 1.21  CO2 26 27 25 26   --   --   --   --  23 24 20* 22 25 26   K 3.8 4.2 4.3 4.1 6.0* 4.6 4.7   < > 4.3 4.1 4.3 4.1 4.3 4.3   < > = values in this interval not displayed.      PAF: Continue amiodarone, okay to resume XARELTO 3/12 per [facility]  Paroxysmal atrial fibrillation. History of DVT/PE: Continue Xarelto. Cont on amiodarone  Chronic HFpEF: Volume status stable wt is ~151kg Filed Weights   06/07/23 0500 06/08/23 0407 06/09/23 0426  Weight: (!) 154.2 kg (!) 152.6 kg (!) 151.5 kg   Type 2 diabetes  mellitus: Continue SSI, resume metformin and Farxiga on discharge Recent Labs  Lab 06/08/23 1552 06/08/23 2114 06/09/23 0618 06/09/23 0804 06/09/23 1046  GLUCAP 166* 183* 130*  98 142*    OSA: Reports he has not used CPAP for the last 2 years  Chronic normocytic anemia: B/L HB ~10 g.  Hemoglobin have been drifting slowly likely in the setting of multiple procedures/wound VAC with serosanguineous drainage.  Denies other bleeding/bleeding in colostomy.  He agrees for blood transfusion on 62 PRBC due to his symptomatic anemia after discussing risk benefits  Recent Labs  Lab 06/05/23 0922 06/06/23 0800 06/07/23 0752 06/08/23 0410 06/09/23 0900  HGB 7.9* 8.2* 7.6* 7.2* 7.1*  HCT 24.0* 25.7* 23.8* 22.5* 22.1*      Morbid obesity: Estimated body mass index is 45.3 kg/m as calculated from the following:   Height as of this encounter: 6' (1.829 m).   Weight as of this encounter: 151.5 kg.   Moderate malnutrition: augment diet as below Etiology: chronic illness Signs/Symptoms: mild fat depletion, mild muscle depletion, energy intake < or equal to 75% for > or equal to 1 month Interventions: Refer to RD note for recommendations  DVT prophylaxis: Place and maintain sequential compression device Start: 06/02/23 1410 Code Status:   Code Status: Full Code Family Communication: plan of care discussed with patient at bedside.  Level of care: Telemetry Medical   Dispo: The patient is from: Home alone brother lives nearby            Anticipated disposition: Skilled nursing facility once bed available     Objective: Vitals last 24 hrs: Vitals:   06/09/23 0428 06/09/23 0429 06/09/23 0733 06/09/23 1048  BP:   132/65 138/68  Pulse: 78 78 79 80  Resp: 15 18 16 14   Temp:   97.9 F (36.6 C)   TempSrc:   Oral   SpO2: 93% 92% 96% 95%  Weight:      Height:       Weight change: -1.1 kg  Physical Examination: General exam: alert awake, oriented pleasant on room air  HEENT:Oral mucosa moist, Ear/Nose WNL grossly Respiratory system: Bilaterally clear BS,no use of accessory muscle Cardiovascular system: S1 & S2 +, No JVD. Gastrointestinal system: Abdomen soft,  colostomy present midline wound VAC present  Nervous System: Alert, awake, moving all extremities,and following commands. Extremities: LE edema mild,distal peripheral pulses palpable and warm.  Skin: No rashes,no icterus. MSK: Normal muscle bulk,tone, power    Medications reviewed:  Scheduled Meds:  sodium chloride   Intravenous Once   amiodarone  200 mg Oral Daily   Chlorhexidine Gluconate Cloth  6 each Topical Daily   feeding supplement  237 mL Oral BID BM   insulin aspart  0-20 Units Subcutaneous TID WC   insulin aspart  0-5 Units Subcutaneous QHS   lidocaine  1 patch Transdermal Q24H   methocarbamol  1,000 mg Oral Q8H   multivitamin with minerals  1 tablet Oral Daily   pantoprazole  40 mg Oral Daily   rivaroxaban  20 mg Oral Daily   Continuous Infusions:  liver oil-zinc oxide       Intake/Output Summary (Last 24 hours) at 06/09/2023 1142 Last data filed at 06/09/2023 1100 Gross per 24 hour  Intake 953 ml  Output 1600 ml  Net -647 ml   Net IO Since Admission: 5,602.22 mL [06/09/23 1142]  Wt Readings from Last 3 Encounters:  06/09/23 (!) 151.5 kg  05/11/23 (!) 156.5 kg  05/03/23 (!) 153.4  kg     Data Reviewed: I have personally reviewed following labs and imaging studies CBC: Recent Labs  Lab 06/05/23 0922 06/06/23 0800 06/07/23 0752 06/08/23 0410 06/09/23 0900  WBC 14.3* 13.4* 11.4* 10.0 8.0  NEUTROABS 11.2*  --  9.3*  --   --   HGB 7.9* 8.2* 7.6* 7.2* 7.1*  HCT 24.0* 25.7* 23.8* 22.5* 22.1*  MCV 100.4* 95.9 97.5 97.0 99.5  PLT 303 332 322 323 313   Basic Metabolic Panel:  Recent Labs  Lab 06/03/23 0824 06/05/23 1003 06/07/23 0752 06/08/23 0410  NA 136 138 135 135  K 4.3 4.1 4.3 4.3  CL 107 106 103 101  CO2 20* 22 25 26   GLUCOSE 178* 153* 122* 126*  BUN 29* 28* 36* 34*  CREATININE 1.09 1.08 1.37* 1.21  CALCIUM 8.8* 8.9 8.7* 8.5*  MG 1.9 1.8  --   --   PHOS 3.6 4.0 3.5  --    GFR: Estimated Creatinine Clearance: 97.2 mL/min (by C-G formula based  on SCr of 1.21 mg/dL). Liver Function Tests:  Recent Labs  Lab 06/07/23 0752  ALBUMIN 2.2*   Recent Labs  Lab 06/08/23 1552 06/08/23 2114 06/09/23 0618 06/09/23 0804 06/09/23 1046  GLUCAP 166* 183* 130* 98 142*   No results for input(s): "CHOL", "HDL", "LDLCALC", "TRIG", "CHOLHDL", "LDLDIRECT" in the last 72 hours. Sepsis Labs: No results for input(s): "PROCALCITON", "LATICACIDVEN" in the last 168 hours. Recent Results (from the past 240 hours)  MRSA Next Gen by PCR, Nasal     Status: None   Collection Time: 05/31/23  6:08 PM   Specimen: Nasal Mucosa; Nasal Swab  Result Value Ref Range Status   MRSA by PCR Next Gen NOT DETECTED NOT DETECTED Final    Comment: (NOTE) The GeneXpert MRSA Assay (FDA approved for NASAL specimens only), is one component of a comprehensive MRSA colonization surveillance program. It is not intended to diagnose MRSA infection nor to guide or monitor treatment for MRSA infections. Test performance is not FDA approved in patients less than 68 years old. Performed at [facility], [address], [place], [place] [address]       Radiology Studies: [place] LT UPPER EXTREM LTD SOFT TISSUE NON VASCULAR Result Date: 06/09/2023 CLINICAL DATA:  Left wrist swelling and redness. EXAM: ULTRASOUND LEFT UPPER EXTREMITY LIMITED TECHNIQUE: Ultrasound examination of the left upper extremity soft tissues was performed in the area of clinical concern. COMPARISON:  None Available. FINDINGS: The area of interest is the left wrist where there is reported redness and swelling. In this region there is edema and increased color flow vascularity suggesting hyperemia on ultrasound. No walled-off fluid collection is seen. Images were also performed within the volar left upper arm where reportedly there is "hardness" near the patient's elbow. In this region, occlusive thrombus is seen within a vein with no internal color flow vascularity. The sonographer did not identify which vein  branch this was. IMPRESSION: 1. In the area of interest of the left wrist were there is redness and swelling, swelling and hyperemia is seen on ultrasound. No walled-off fluid collection is seen. 2. In the volar left upper arm where reportedly there is "hardness" near the patient's elbow, there is occlusive thrombus seen within a vein. The sonographer did not identify which vein branch this was. Recommend clinical correlation. Electronically Signed   By: Neita Garnet M.D.   On: 06/09/2023 10:52    LOS: 17 days   Total time spent in  review of labs and imaging, patient evaluation, formulation of plan, documentation and communication with family: 35 minutes  Lanae Boast, DO  [facility]  06/09/2023, 11:42 AM

## 2023-06-10 DIAGNOSIS — I959 Hypotension, unspecified: Secondary | ICD-10-CM | POA: Diagnosis not present

## 2023-06-10 DIAGNOSIS — E440 Moderate protein-calorie malnutrition: Secondary | ICD-10-CM | POA: Diagnosis not present

## 2023-06-10 DIAGNOSIS — K632 Fistula of intestine: Secondary | ICD-10-CM | POA: Diagnosis not present

## 2023-06-10 DIAGNOSIS — E1165 Type 2 diabetes mellitus with hyperglycemia: Secondary | ICD-10-CM | POA: Diagnosis not present

## 2023-06-10 DIAGNOSIS — E785 Hyperlipidemia, unspecified: Secondary | ICD-10-CM | POA: Diagnosis not present

## 2023-06-10 DIAGNOSIS — L02211 Cutaneous abscess of abdominal wall: Secondary | ICD-10-CM | POA: Diagnosis not present

## 2023-06-10 DIAGNOSIS — Z933 Colostomy status: Secondary | ICD-10-CM | POA: Diagnosis not present

## 2023-06-10 DIAGNOSIS — Z433 Encounter for attention to colostomy: Secondary | ICD-10-CM | POA: Diagnosis not present

## 2023-06-10 DIAGNOSIS — I5032 Chronic diastolic (congestive) heart failure: Secondary | ICD-10-CM | POA: Diagnosis not present

## 2023-06-10 DIAGNOSIS — M6281 Muscle weakness (generalized): Secondary | ICD-10-CM | POA: Diagnosis not present

## 2023-06-10 DIAGNOSIS — Z48817 Encounter for surgical aftercare following surgery on the skin and subcutaneous tissue: Secondary | ICD-10-CM | POA: Diagnosis not present

## 2023-06-10 DIAGNOSIS — R630 Anorexia: Secondary | ICD-10-CM | POA: Diagnosis not present

## 2023-06-10 DIAGNOSIS — K5720 Diverticulitis of large intestine with perforation and abscess without bleeding: Secondary | ICD-10-CM | POA: Diagnosis not present

## 2023-06-10 DIAGNOSIS — D649 Anemia, unspecified: Secondary | ICD-10-CM | POA: Diagnosis not present

## 2023-06-10 DIAGNOSIS — M1A38X0 Chronic gout due to renal impairment, vertebrae, without tophus (tophi): Secondary | ICD-10-CM | POA: Diagnosis not present

## 2023-06-10 DIAGNOSIS — R2681 Unsteadiness on feet: Secondary | ICD-10-CM | POA: Diagnosis not present

## 2023-06-10 DIAGNOSIS — E8721 Acute metabolic acidosis: Secondary | ICD-10-CM | POA: Diagnosis not present

## 2023-06-10 DIAGNOSIS — G4734 Idiopathic sleep related nonobstructive alveolar hypoventilation: Secondary | ICD-10-CM | POA: Diagnosis not present

## 2023-06-10 DIAGNOSIS — I2510 Atherosclerotic heart disease of native coronary artery without angina pectoris: Secondary | ICD-10-CM | POA: Diagnosis not present

## 2023-06-10 DIAGNOSIS — I480 Paroxysmal atrial fibrillation: Secondary | ICD-10-CM | POA: Diagnosis not present

## 2023-06-10 DIAGNOSIS — N1831 Chronic kidney disease, stage 3a: Secondary | ICD-10-CM | POA: Diagnosis not present

## 2023-06-10 DIAGNOSIS — K651 Peritoneal abscess: Secondary | ICD-10-CM | POA: Diagnosis not present

## 2023-06-10 DIAGNOSIS — N179 Acute kidney failure, unspecified: Secondary | ICD-10-CM | POA: Diagnosis not present

## 2023-06-10 DIAGNOSIS — G894 Chronic pain syndrome: Secondary | ICD-10-CM | POA: Diagnosis not present

## 2023-06-10 LAB — TYPE AND SCREEN
ABO/RH(D): A POS
Antibody Screen: NEGATIVE
Unit division: 0

## 2023-06-10 LAB — BPAM RBC
Blood Product Expiration Date: 202504072359
ISSUE DATE / TIME: 202503141452
Unit Type and Rh: 6200

## 2023-06-10 LAB — CBC
HCT: 27.1 % — ABNORMAL LOW (ref 39.0–52.0)
Hemoglobin: 8.6 g/dL — ABNORMAL LOW (ref 13.0–17.0)
MCH: 31 pg (ref 26.0–34.0)
MCHC: 31.7 g/dL (ref 30.0–36.0)
MCV: 97.8 fL (ref 80.0–100.0)
Platelets: 357 10*3/uL (ref 150–400)
RBC: 2.77 MIL/uL — ABNORMAL LOW (ref 4.22–5.81)
RDW: 19.1 % — ABNORMAL HIGH (ref 11.5–15.5)
WBC: 9.2 10*3/uL (ref 4.0–10.5)
nRBC: 0 % (ref 0.0–0.2)

## 2023-06-10 LAB — GLUCOSE, CAPILLARY
Glucose-Capillary: 104 mg/dL — ABNORMAL HIGH (ref 70–99)
Glucose-Capillary: 146 mg/dL — ABNORMAL HIGH (ref 70–99)
Glucose-Capillary: 131 mg/dL — ABNORMAL HIGH (ref 70–99)

## 2023-06-10 MED ORDER — PANTOPRAZOLE SODIUM 40 MG PO TBEC: 40.0000 mg | DELAYED_RELEASE_TABLET | Freq: Every day | ORAL | Status: AC

## 2023-06-10 MED ORDER — OXYCODONE-ACETAMINOPHEN 10-325 MG PO TABS: 1.0000 | ORAL_TABLET | Freq: Four times a day (QID) | ORAL | 0 refills | Status: DC | PRN

## 2023-06-10 NOTE — Progress Notes (Signed)
PICC line was removed per order with no complications.  Pt supine and suspended inspiration during removal with instructions to remain supine for 30 minutes after removal.  Pressure held to achieve hemostasis.  Vaseline/gauze/tegaderm applied.  Patient education provided regarding lifting restrictions, site care, and signs of infection.  Pt verbalized understanding and had no questions.

## 2023-06-10 NOTE — Plan of Care (Signed)
  Problem: Education: Goal: Ability to describe self-care measures that may prevent or decrease complications (Diabetes Survival Skills Education) will improve Outcome: Progressing   Problem: Coping: Goal: Ability to adjust to condition or change in health will improve Outcome: Progressing   Problem: Fluid Volume: Goal: Ability to maintain a balanced intake and output will improve Outcome: Progressing   Problem: Health Behavior/Discharge Planning: Goal: Ability to identify and utilize available resources and services will improve Outcome: Progressing Goal: Ability to manage health-related needs will improve Outcome: Progressing   Problem: Metabolic: Goal: Ability to maintain appropriate glucose levels will improve Outcome: Progressing   Problem: Nutritional: Goal: Maintenance of adequate nutrition will improve Outcome: Progressing Goal: Progress toward achieving an optimal weight will improve Outcome: Progressing   Problem: Skin Integrity: Goal: Risk for impaired skin integrity will decrease Outcome: Progressing   Problem: Education: Goal: Knowledge of General Education information will improve Description: Including pain rating scale, medication(s)/side effects and non-pharmacologic comfort measures Outcome: Progressing   Problem: Health Behavior/Discharge Planning: Goal: Ability to manage health-related needs will improve Outcome: Progressing   Problem: Clinical Measurements: Goal: Ability to maintain clinical measurements within normal limits will improve Outcome: Progressing Goal: Will remain free from infection Outcome: Progressing Goal: Diagnostic test results will improve Outcome: Progressing Goal: Respiratory complications will improve Outcome: Progressing   Problem: Activity: Goal: Risk for activity intolerance will decrease Outcome: Progressing   Problem: Nutrition: Goal: Adequate nutrition will be maintained Outcome: Progressing   Problem:  Coping: Goal: Level of anxiety will decrease Outcome: Progressing   Problem: Elimination: Goal: Will not experience complications related to bowel motility Outcome: Progressing Goal: Will not experience complications related to urinary retention Outcome: Progressing   Problem: Pain Managment: Goal: General experience of comfort will improve and/or be controlled Outcome: Progressing   Problem: Safety: Goal: Ability to remain free from injury will improve Outcome: Progressing   Problem: Skin Integrity: Goal: Risk for impaired skin integrity will decrease Outcome: Progressing

## 2023-06-10 NOTE — Progress Notes (Signed)
 Arrived to pt room to remove PICC line per order.  Per primary RN, discharge plans including placement and transportation are not yet finalized.  We agreed that she would message me via secure chat when patient is ready for discharge.

## 2023-06-10 NOTE — Discharge Summary (Signed)
 Physician Discharge Summary  HALTON NEAS WUJ:[id number] DOB: [birth year] DOA: 05/23/2023  PCP: Mechele Claude, MD  Admit date: 05/23/2023 Discharge date: 06/10/2023 Recommendations for Outpatient Follow-up:  Follow up with PCP in 1 weeks-call for appointment Please obtain BMP/CBC in one week  Discharge Dispo: SNF Discharge Condition: Stable Code Status:   Code Status: Full Code Diet recommendation:  Diet Order             Diet regular Room service appropriate? Yes; Fluid consistency: Thin  Diet effective now                    Brief/Interim Summary: 63 yom w/ HTN T2DM-on metformin/Farxiga/Januvia, OSA with CPAP intolerance, history of DVT and PE on Xarelto, PAF-on amiodarone, chronic HFpEF, and recent admission with LLQ intra-abdominal abscess, colocutaneous fistula, and abdominal wall abscess last admit 04/10/2023 and IR had a drain placed in the left lower quadrant abscess-seen by ID and was discharged on Bactrim and Augmentin, presented to hospital with generalized weakness and malaise on 05/23/23 with malaise weakness lack of appetite lightheadedness, and admitted for AKI on CKD, lactic acidosis and abdominal wall abscess/intra-abdominal abscess and colocutaneous fistula. Patient remains overall stable [place] for skilled nursing facility Patient was transfused 1 unit PRBC due to symptomatic anemia with hemoglobin 7.1 on 3/14, SNF has been approved and is stable for discharge 3/15  Significant procedures/imaging: 05/23/23>> CT abd>>percutaneous pigtail catheter in the left anterior abdominal wall with interval resolution/decrease in abscess at the catheter tip, and persistent air-fluid collection along the catheter tract concerning for abdominal wall abscess measuring up to 6.1 cm.  05/30/23>> Ex lap, open sigmoid colectomy, colostomy creation, small bowel resection, left colectomy, debridement of abdominal wall, VAC placement -Dr Dwain Sarna.  Consultation: PCCM/critical care General  Surgery Cardiology for preop evaluation Wound care     Discharge Diagnoses:  Principal Problem:   Colocutaneous fistula Active Problems:   Chronic heart failure with preserved ejection fraction (HFpEF) (HCC)   Severe obstructive sleep apnea-hypopnea syndrome   Sleep related hypoxia   Type 2 diabetes mellitus with hyperglycemia, without long-term current use of insulin (HCC)   Paroxysmal atrial fibrillation (HCC)   History of pulmonary embolus (PE)   Abdominal wall abscess   Acute renal failure superimposed on stage 3 chronic kidney disease, unspecified acute renal failure type, unspecified whether stage 3a or 3b CKD (HCC)   Malnutrition of moderate degree   Hypotension 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  Patient completed antibiotics x 5 days, off TPN 3/12 tolerating diet Continue colostomy care wound VAC/wound care-to change Monday/Thursday. Plan for PT OT and skilled nursing facility placement  Swelling of left forearm: Duplex ordered pending.  AKI on CKD stage IIIa: B/l ~ 1.4-1.5.  Remains stable improved Recent Labs    05/26/23 0306 05/27/23 0423 05/28/23 0318 05/29/23 0338 05/31/23 1214 05/31/23 1239 05/31/23 1414 05/31/23 1533 06/01/23 0424 06/02/23 0415 06/03/23 0824 06/05/23 1003 06/07/23 0752 06/08/23 0410  BUN 29* 31* 35* 34* 58* 36* 38*  --  41* 30* 29* 28* 36* 34*  CREATININE 1.47* 1.27* 1.23 1.12 1.30* 1.10 1.10  --  1.20 1.04 1.09 1.08 1.37* 1.21  CO2 26 27 25 26   --   --   --   --  23 24 20* 22 25 26   K 3.8 4.2 4.3  4.1 6.0* 4.6 4.7   < > 4.3 4.1 4.3 4.1 4.3 4.3   < > = values in this interval not displayed.      PAF: Continue amiodarone, okay to resume XARELTO 3/12 per [facility]  Paroxysmal atrial  fibrillation. History of DVT/PE: Continue Xarelto. Cont on amiodarone  Chronic HFpEF: Volume status stable wt is ~151kg Filed Weights   06/08/23 0407 06/09/23 0426 06/10/23 0409  Weight: (!) 152.6 kg (!) 151.5 kg (!) 150.9 kg   Type 2 diabetes mellitus: Continue SSI, resume metformin and Farxiga on discharge Recent Labs  Lab 06/09/23 1046 06/09/23 1217 06/09/23 1603 06/09/23 2028 06/10/23 0607  GLUCAP 142* 133* 159* 146* 104*    OSA: Reports he has not used CPAP for the last 2 years  Chronic normocytic anemia: B/L HB ~10 g.  Hemoglobin have been drifting slowly likely in the setting of multiple procedures/wound VAC with serosanguineous drainage.  Denies other bleeding/bleeding in colostomy.  W/ 1 unit prbc hb improved Recent Labs  Lab 06/06/23 0800 06/07/23 0752 06/08/23 0410 06/09/23 0900 06/10/23 0900  HGB 8.2* 7.6* 7.2* 7.1* 8.6*  HCT 25.7* 23.8* 22.5* 22.1* 27.1*      Morbid obesity: Estimated body mass index is 45.12 kg/m as calculated from the following:   Height as of this encounter: 6' (1.829 m).   Weight as of this encounter: 150.9 kg.   Moderate malnutrition: augment diet as below Etiology: chronic illness Signs/Symptoms: mild fat depletion, mild muscle depletion, energy intake < or equal to 75% for > or equal to 1 month Interventions: Refer to RD note for recommendations  Subjective: Aaox3 eager to go to snf  Discharge Exam: Vitals:   06/10/23 0409 06/10/23 0820  BP: (!) 149/80 (!) 150/80  Pulse: 77 77  Resp: 15 15  Temp: 98.5 F (36.9 C) 97.8 F (36.6 C)  SpO2: 93% 92%   General: Pt is alert, awake, not in acute distress Cardiovascular: RRR, S1/S2 +, no rubs, no gallops Respiratory: CTA bilaterally, no wheezing, no rhonchi Abdominal: Soft, NT, ND, bowel sounds + Extremities: no edema, no cyanosis  Discharge Instructions  Discharge Instructions     Ambulatory referral to Wound Clinic   Complete by: As directed    Schedule in ~4  weeks - pt discharging to SNF   Discharge instructions   Complete by: As directed    Please call call MD or return to ER for similar or worsening recurring problem that brought you to hospital or if any fever,nausea/vomiting,abdominal pain, uncontrolled pain, chest pain,  shortness of breath or any other alarming symptoms.  Please follow-up your doctor as instructed in a week time and call the office for appointment.  Please avoid alcohol, smoking, or any other illicit substance and maintain healthy habits including taking your regular medications as prescribed.  You were cared for by a hospitalist during your hospital stay. If you have any questions about your discharge medications or the care you received while you were in the hospital after you are discharged, you can call the unit and ask to speak with the hospitalist on call if the hospitalist that took care of you is not available.  Once you are discharged, your primary care physician will handle any further medical issues. Please note that NO REFILLS for any discharge medications will be authorized once you are discharged, as it is imperative that you return to your primary care physician (or establish a relationship with a primary care physician if you do not have  one) for your aftercare needs so that they can reassess your need for medications and monitor your lab values   Discharge wound care:   Complete by: As directed    - WOCN for new ostomy teaching. Will need ostomy clinic referral at d/c. - Wound vac to midline, change M/Th - Continue BID WTD to left abdominal wound  06/03/23 1000    Wound care  Every shift      Comments: LLQ abdominal wound bid wet to dry dressing changes with NS moistened kerlix gauze  Negative pressure wound therapy  Every Mon-Wed-Fri 1000>  change MONDay and Thursday. Black foam only. Amount of suction: 125 mm/Hg   Increase activity slowly   Complete by: As directed       Allergies as of 06/10/2023        Reactions   Aldactone [spironolactone] Hives   Bystolic [nebivolol Hcl] Swelling   Motrin [ibuprofen] Other (See Comments)   Rectal bleed   Actos [pioglitazone] Other (See Comments), Cough   Dyspnea  Edema   Trulicity [dulaglutide] Other (See Comments), Cough   Shortness of breath GI intolerance   Zestril [lisinopril] Other (See Comments), Cough   Wheezing    Cozaar [losartan] Rash   Diflucan [fluconazole] Rash   Hctz [hydrochlorothiazide] Other (See Comments)   Gout flares.   Norvasc [amlodipine] Itching, Other (See Comments)   Myalgias         Medication List     TAKE these medications    allopurinol 100 MG tablet Commonly known as: ZYLOPRIM TAKE ONE TABLET BY MOUTH ONCE DAILY   amiodarone 200 MG tablet Commonly known as: PACERONE Take 1 tablet (200 mg total) by mouth daily.   atorvastatin 20 MG tablet Commonly known as: LIPITOR TAKE ONE TABLET BY MOUTH ONCE DAILY   dapagliflozin propanediol 10 MG Tabs tablet Commonly known as: FARXIGA TAKE ONE TABLET BY MOUTH DAILY BEFORE BREAKFAST   DHEA PO Take 1 capsule by mouth daily.   Januvia 100 MG tablet Generic drug: sitaGLIPtin TAKE ONE TABLET BY MOUTH ONCE DAILY   metFORMIN 750 MG 24 hr tablet Commonly known as: GLUCOPHAGE-XR Take 2 tablets (1,500 mg total) by mouth daily with breakfast. What changed: how much to take   oxyCODONE-acetaminophen 10-325 MG tablet Commonly known as: PERCOCET Take 1 tablet by mouth every 6 (six) hours as needed for up to 4 doses for pain.   pantoprazole 40 MG tablet Commonly known as: PROTONIX Take 1 tablet (40 mg total) by mouth daily. Start taking on: June 11, 2023   polyethylene glycol 17 g packet Commonly known as: MIRALAX / GLYCOLAX Take 17 g by mouth daily. What changed:  when to take this reasons to take this   VITAMIN D-3 PO Take 1 tablet by mouth daily.   Xarelto 20 MG Tabs tablet Generic drug: rivaroxaban TAKE ONE TABLET BY MOUTH DAILY WITH  SUPPER               Discharge Care Instructions  (From admission, onward)           Start     Ordered   06/10/23 0000  Discharge wound care:       Comments: - WOCN for new ostomy teaching. Will need ostomy clinic referral at d/c. - Wound vac to midline, change M/Th - Continue BID WTD to left abdominal wound  06/03/23 1000    Wound care  Every shift      Comments: LLQ abdominal wound bid wet to dry  dressing changes with NS moistened kerlix gauze  Negative pressure wound therapy  Every Mon-Wed-Fri 1000>  change MONDay and Thursday. Black foam only. Amount of suction: 125 mm/Hg   06/10/23 1610            Contact information for follow-up providers     Emelia Loron, MD. Go on 07/06/2023.   Specialty: General Surgery Why: 11:40 AM, please arrive by 11:20 to check in. Please bring VAC supplies with you to appointment so that we may evaluate wound. Contact information: [address] [place] [address] [phone]         Mechele Claude, MD Follow up in 1 week(s).   Specialty: Family Medicine Contact information: [address] [place] [address] [phone]              Contact information for after-discharge care     Destination     [facility] .   Service: Skilled Nursing Contact information: [address] [place] [address] [phone]                    Allergies  Allergen Reactions   Aldactone [Spironolactone] Hives   Bystolic [Nebivolol Hcl] Swelling   Motrin [Ibuprofen] Other (See Comments)    Rectal bleed   Actos [Pioglitazone] Other (See Comments) and Cough    Dyspnea  Edema   Trulicity [Dulaglutide] Other (See Comments) and Cough    Shortness of breath GI intolerance   Zestril [Lisinopril] Other (See Comments) and Cough    Wheezing    Cozaar [Losartan] Rash   Diflucan [Fluconazole] Rash   Hctz [Hydrochlorothiazide] Other (See Comments)     Gout flares.   Norvasc [Amlodipine] Itching and Other (See Comments)    Myalgias     The results of significant diagnostics from this hospitalization (including imaging, microbiology, ancillary and laboratory) are listed below for reference.    Microbiology: Recent Results (from the past 240 hours)  MRSA Next Gen by PCR, Nasal     Status: None   Collection Time: 05/31/23  6:08 PM   Specimen: Nasal Mucosa; Nasal Swab  Result Value Ref Range Status   MRSA by PCR Next Gen NOT DETECTED NOT DETECTED Final    Comment: (NOTE) The GeneXpert MRSA Assay (FDA approved for NASAL specimens only), is one component of a comprehensive MRSA colonization surveillance program. It is not intended to diagnose MRSA infection nor to guide or monitor treatment for MRSA infections. Test performance is not FDA approved in patients less than 68 years old. Performed at [facility], [address] [place], [place] [address]     Procedures/Studies: VAS [place] UPPER EXTREMITY VENOUS DUPLEX Result Date: 06/09/2023 UPPER VENOUS STUDY  Patient Name:  Bradley Torres  Date of Exam:   06/09/2023 Medical Rec #: [id number]        Accession #:    [id number] Date of Birth: 04-01-1960         Patient Gender: M Patient Age:   18 years Exam Location:  [facility] Procedure:      VAS [place] UPPER EXTREMITY VENOUS DUPLEX Referring Phys: Kennon Encinas --------------------------------------------------------------------------------  Indications: Swelling Other Indications: Previous IV placement ro LUE. Limitations: Body habitus and poor ultrasound/tissue interface. Comparison Study: No previous exams Performing Technologist: Jody Hill RVT, RDMS  Examination Guidelines: A complete evaluation includes B-mode imaging, spectral Doppler, color Doppler, and power Doppler as needed of all accessible portions of each vessel. Bilateral  testing is considered an integral part of a complete examination. Limited examinations for reoccurring  indications may be performed as noted.  Right Findings: +----------+------------+---------+-----------+----------+-------+ RIGHT     CompressiblePhasicitySpontaneousPropertiesSummary +----------+------------+---------+-----------+----------+-------+ Subclavian    Full       Yes       Yes                      +----------+------------+---------+-----------+----------+-------+  Left Findings: +----------+------------+---------+-----------+----------+---------------------+ LEFT      CompressiblePhasicitySpontaneousProperties       Summary        +----------+------------+---------+-----------+----------+---------------------+ IJV           Full       Yes       Yes                                    +----------+------------+---------+-----------+----------+---------------------+ Subclavian    Full       Yes       Yes                                    +----------+------------+---------+-----------+----------+---------------------+ Axillary      Full       Yes       Yes                                    +----------+------------+---------+-----------+----------+---------------------+ Brachial      Full       Yes       Yes               only one of paired                                                            visualized       +----------+------------+---------+-----------+----------+---------------------+ Radial        Full                                   only one of paired                                                            visualized       +----------+------------+---------+-----------+----------+---------------------+ Ulnar                                               patent by color only  +----------+------------+---------+-----------+----------+---------------------+ Cephalic      None       No        No                       Acute         +----------+------------+---------+-----------+----------+---------------------+  Basilic       Full       Yes       Yes                                    +----------+------------+---------+-----------+----------+---------------------+  Summary:  Right: No evidence of thrombosis in the subclavian.  Left: No evidence of deep vein thrombosis in the upper extremity. Findings consistent with acute superficial vein thrombosis involving the left cephalic vein ( proximal upper arm to mid forearm).  *See table(s) above for measurements and observations.     Preliminary    [place] LT UPPER EXTREM LTD SOFT TISSUE NON VASCULAR Result Date: 06/09/2023 CLINICAL DATA:  Left wrist swelling and redness. EXAM: ULTRASOUND LEFT UPPER EXTREMITY LIMITED TECHNIQUE: Ultrasound examination of the left upper extremity soft tissues was performed in the area of clinical concern. COMPARISON:  None Available. FINDINGS: The area of interest is the left wrist where there is reported redness and swelling. In this region there is edema and increased color flow vascularity suggesting hyperemia on ultrasound. No walled-off fluid collection is seen. Images were also performed within the volar left upper arm where reportedly there is "hardness" near the patient's elbow. In this region, occlusive thrombus is seen within a vein with no internal color flow vascularity. The sonographer did not identify which vein branch this was. IMPRESSION: 1. In the area of interest of the left wrist were there is redness and swelling, swelling and hyperemia is seen on ultrasound. No walled-off fluid collection is seen. 2. In the volar left upper arm where reportedly there is "hardness" near the patient's elbow, there is occlusive thrombus seen within a vein. The sonographer did not identify which vein branch this was. Recommend clinical correlation. Electronically Signed   By: Neita Garnet M.D.   On: 06/09/2023 10:52   [place] EKG SITE RITE Result Date: 06/04/2023 If Site Rite image not attached, placement could not be confirmed due to current  cardiac rhythm.  DG Abd Portable 1V Result Date: 06/01/2023 CLINICAL DATA:  Check gastric catheter placement EXAM: PORTABLE ABDOMEN - 1 VIEW COMPARISON:  None Available. FINDINGS: Gastric catheter is noted coiled within the stomach. Postsurgical changes in the lumbar spine are noted. No free air is seen. IMPRESSION: Gastric catheter coiled within the stomach. Electronically Signed   By: Alcide Clever M.D.   On: 06/01/2023 01:03   DG Abd Portable 1V Result Date: 05/31/2023 CLINICAL DATA:  Check gastric catheter placement EXAM: PORTABLE ABDOMEN - 1 VIEW COMPARISON:  None Available. FINDINGS: Gastric catheter is noted within the stomach. Proximal side port lies at the level of the gastroesophageal junction. This should be advanced deeper into the stomach. IMPRESSION: Gastric catheter as described. This should be advanced deeper into the stomach. Electronically Signed   By: Alcide Clever M.D.   On: 05/31/2023 22:33   Portable Chest x-ray Result Date: 05/31/2023 CLINICAL DATA:  Endotracheal tube present. EXAM: PORTABLE CHEST 1 VIEW COMPARISON:  Radiograph 04/10/2019 FINDINGS: Endotracheal tube tip 5.3 cm from the carina at the level of the clavicular heads. Right upper extremity PICC tip in the upper SVC. Enteric tube tip below the diaphragm not included in the field of view. Low lung volumes without focal airspace disease. Normal heart size for technique. No pleural effusion or pneumothorax. IMPRESSION: 1. Endotracheal tube tip 5.3 cm from the carina. 2. Right upper extremity PICC tip in the upper SVC. 3.  Low lung volumes without acute chest findings. Electronically Signed   By: Narda Rutherford M.D.   On: 05/31/2023 17:35   DG Abd Portable 1V Result Date: 05/31/2023 CLINICAL DATA:  Nasogastric tube placement. EXAM: PORTABLE ABDOMEN - 1 VIEW COMPARISON:  None Available. FINDINGS: Enteric tube tip below the diaphragm in the stomach, the side port is just beyond the gastroesophageal junction. No bowel dilatation in  the upper abdomen. IMPRESSION: Tip and side port of the enteric tube below the diaphragm in the stomach. Electronically Signed   By: Narda Rutherford M.D.   On: 05/31/2023 17:34   [place] EKG SITE RITE Result Date: 05/25/2023 If [facility] image not attached, placement could not be confirmed due to current cardiac rhythm.  CT ABDOMEN PELVIS WO CONTRAST Result Date: 05/23/2023 CLINICAL DATA:  Concern for intraabdominal abscess. EXAM: CT ABDOMEN AND PELVIS WITHOUT CONTRAST TECHNIQUE: Multidetector CT imaging of the abdomen and pelvis was performed following the standard protocol without IV contrast. RADIATION DOSE REDUCTION: This exam was performed according to the departmental dose-optimization program which includes automated exposure control, adjustment of the mA and/or kV according to patient size and/or use of iterative reconstruction technique. COMPARISON:  CT abdomen and pelvis 04/27/2023. FINDINGS: Lower chest: No acute abnormality. Hepatobiliary: Small gallstones are present. There is no biliary ductal dilatation gestation. Liver is within normal limits. Pancreas: Unremarkable. No pancreatic ductal dilatation or surrounding inflammatory changes. Spleen: Normal in size without focal abnormality. Adrenals/Urinary Tract: There is a 4.5 cm low-density area in the superior pole of the left kidney, most likely a cyst. Otherwise, kidneys, adrenal glands and bladder are within normal limits. Stomach/Bowel: Stomach is within normal limits. Appendix appears normal. Focal small bowel wall thickening is seen adjacent to intraabdominal fluid collection in the left anterior abdomen without surrounding inflammation. There is no bowel obstruction or pneumatosis. There is sigmoid and descending colon diverticulosis. Vascular/Lymphatic: Aortic atherosclerosis. No enlarged abdominal or pelvic lymph nodes. Reproductive: Prostate is unremarkable. Other: There is a percutaneous pigtail catheter in the left anterior abdominal wall  with distal catheter tip within previously identified intraabdominal abscess. The abscess collection has resolved/decreased in the interval. There is a adjacent small bowel wall thickening in this region, likely reactive. Air-fluid collection in the deep subcu cutaneous tissues along the catheter tract persists measuring 2.6 by 3.0 x 6.1 cm. There is subcutaneous inflammatory stranding surrounding this collection. There is no ascites or free air. Musculoskeletal: L4-5 posterior fusion hardware is present. IMPRESSION: 1. Percutaneous pigtail catheter in the left anterior abdominal wall with distal catheter tip within previously identified intraabdominal abscess. The abscess collection has resolved/decreased in the interval. 2. Persistent air-fluid collection in the deep subcutaneous tissues along the catheter tract measuring 2.6 x 3.0 x 6.1 cm worrisome for abdominal wall abscess. 3. Adjacent small bowel wall thickening in this region is likely reactive. 4. Cholelithiasis. 5. Colonic diverticulosis. 6. Aortic atherosclerosis. Aortic Atherosclerosis (ICD10-I70.0). Electronically Signed   By: Darliss Cheney M.D.   On: 05/23/2023 21:15   IR Catheter Tube Change Result Date: 05/23/2023 INDICATION: 63 year old male with diverticulitis history, prior abscess, as well as abdominal wall abscess. Prior drainage was performed 04/28/2023 of the intra-abdominal abscess with spontaneous decompression of the abdominal wall abscess. He presents feeling acutely ill, with decreasing drainage through the intra-abdominal drain and increasing drainage around the drain at the skin site. EXAM: IMAGE GUIDED DRAIN INJECTION AND EXCHANGE MEDICATIONS: The patient is currently receiving oral antibiotics. ANESTHESIA/SEDATION: None COMPLICATIONS: None PROCEDURE: Informed written consent was obtained  from the patient after a thorough discussion of the procedural risks, benefits and alternatives. All questions were addressed. Maximal Sterile  Barrier Technique was utilized including caps, mask, sterile gowns, sterile gloves, sterile drape, hand hygiene and skin antiseptic. A timeout was performed prior to the initiation of the procedure. Patient was positioned supine under the image intensifier. The drain in the skin in the left lower quadrant were prepped and draped in the usual sterile fashion. Scout images were acquired. The drain was injected recording images. Modified Seldinger technique was then used to exchange the drain for a new upsize 14 French pigtail drain catheter. 1% lidocaine was used for local anesthesia. The drain was sutured in position and attached to gravity drain. Patient tolerated the procedure well and remained hemodynamically stable throughout. No complications were encountered and no significant blood loss. FINDINGS: Abscess persists with partially occluded 12 French drain. 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 IMPRESSION: Status post image guided exchange and up size of left lower quadrant drain to a 14 French pigtail drain catheter, attached to gravity. Fistula confirmed. Signed, Yvone Neu. Miachel Roux, RPVI [facility] Radiology Electronically Signed   By: Gilmer Mor D.O.   On: 05/23/2023 16:49    Labs: BNP (last 3 results) Recent Labs    04/28/23 0102 04/29/23 2204  BNP 30.3 71.9   Basic Metabolic Panel: Recent Labs  Lab 06/05/23 1003 06/07/23 0752 06/08/23 0410  NA 138 135 135  K 4.1 4.3 4.3  CL 106 103 101  CO2 22 25 26   GLUCOSE 153* 122* 126*  BUN 28* 36* 34*  CREATININE 1.08 1.37* 1.21  CALCIUM 8.9 8.7* 8.5*  MG 1.8  --   --   PHOS 4.0 3.5  --    Liver Function Tests: Recent Labs  Lab 06/07/23 0752  ALBUMIN 2.2*   No results for input(s): "LIPASE", "AMYLASE" in the last 168 hours. No results for input(s): "AMMONIA" in the last 168 hours. CBC: Recent Labs  Lab 06/05/23 0922 06/06/23 0800 06/07/23 0752 06/08/23 0410 06/09/23 0900  06/10/23 0900  WBC 14.3* 13.4* 11.4* 10.0 8.0 9.2  NEUTROABS 11.2*  --  9.3*  --   --   --   HGB 7.9* 8.2* 7.6* 7.2* 7.1* 8.6*  HCT 24.0* 25.7* 23.8* 22.5* 22.1* 27.1*  MCV 100.4* 95.9 97.5 97.0 99.5 97.8  PLT 303 332 322 323 313 357   Cardiac Enzymes: No results for input(s): "CKTOTAL", "CKMB", "CKMBINDEX", "TROPONINI" in the last 168 hours. BNP: Invalid input(s): "POCBNP" CBG: Recent Labs  Lab 06/09/23 1046 06/09/23 1217 06/09/23 1603 06/09/23 2028 06/10/23 0607  GLUCAP 142* 133* 159* 146* 104*   D-Dimer No results for input(s): "DDIMER" in the last 72 hours. Hgb A1c No results for input(s): "HGBA1C" in the last 72 hours. Lipid Profile No results for input(s): "CHOL", "HDL", "LDLCALC", "TRIG", "CHOLHDL", "LDLDIRECT" in the last 72 hours. Thyroid function studies No results for input(s): "TSH", "T4TOTAL", "T3FREE", "THYROIDAB" in the last 72 hours.  Invalid input(s): "FREET3" Anemia work up No results for input(s): "VITAMINB12", "FOLATE", "FERRITIN", "TIBC", "IRON", "RETICCTPCT" in the last 72 hours. Urinalysis    Component Value Date/Time   COLORURINE YELLOW 05/24/2023 0100   APPEARANCEUR CLEAR 05/24/2023 0100   LABSPEC 1.013 05/24/2023 0100   PHURINE 5.0 05/24/2023 0100   GLUCOSEU >=500 (A) 05/24/2023 0100   HGBUR NEGATIVE 05/24/2023 0100   BILIRUBINUR NEGATIVE 05/24/2023 0100   KETONESUR NEGATIVE 05/24/2023 0100   PROTEINUR NEGATIVE 05/24/2023 0100  NITRITE NEGATIVE 05/24/2023 0100   LEUKOCYTESUR NEGATIVE 05/24/2023 0100   Sepsis Labs Recent Labs  Lab 06/07/23 0752 06/08/23 0410 06/09/23 0900 06/10/23 0900  WBC 11.4* 10.0 8.0 9.2   Microbiology Recent Results (from the past 240 hours)  MRSA Next Gen by PCR, Nasal     Status: None   Collection Time: 05/31/23  6:08 PM   Specimen: Nasal Mucosa; Nasal Swab  Result Value Ref Range Status   MRSA by PCR Next Gen NOT DETECTED NOT DETECTED Final    Comment: (NOTE) The GeneXpert MRSA Assay (FDA approved  for NASAL specimens only), is one component of a comprehensive MRSA colonization surveillance program. It is not intended to diagnose MRSA infection nor to guide or monitor treatment for MRSA infections. Test performance is not FDA approved in patients less than 38 years old. Performed at [facility], [address] [place], [place] [address]      Time coordinating discharge: 35 minutes  SIGNED: Lanae Boast, MD  [facility] 06/10/2023, 9:52 AM  If 7PM-7AM, please contact night-coverage [email or web address]

## 2023-06-12 DIAGNOSIS — Z86711 Personal history of pulmonary embolism: Secondary | ICD-10-CM | POA: Diagnosis not present

## 2023-06-12 DIAGNOSIS — N1831 Chronic kidney disease, stage 3a: Secondary | ICD-10-CM | POA: Diagnosis not present

## 2023-06-12 DIAGNOSIS — I2510 Atherosclerotic heart disease of native coronary artery without angina pectoris: Secondary | ICD-10-CM | POA: Diagnosis not present

## 2023-06-12 DIAGNOSIS — E785 Hyperlipidemia, unspecified: Secondary | ICD-10-CM | POA: Diagnosis not present

## 2023-06-12 DIAGNOSIS — K632 Fistula of intestine: Secondary | ICD-10-CM | POA: Diagnosis not present

## 2023-06-12 DIAGNOSIS — I129 Hypertensive chronic kidney disease with stage 1 through stage 4 chronic kidney disease, or unspecified chronic kidney disease: Secondary | ICD-10-CM | POA: Diagnosis not present

## 2023-06-12 DIAGNOSIS — M1A38X0 Chronic gout due to renal impairment, vertebrae, without tophus (tophi): Secondary | ICD-10-CM | POA: Diagnosis not present

## 2023-06-12 DIAGNOSIS — E1122 Type 2 diabetes mellitus with diabetic chronic kidney disease: Secondary | ICD-10-CM | POA: Diagnosis not present

## 2023-06-12 DIAGNOSIS — E440 Moderate protein-calorie malnutrition: Secondary | ICD-10-CM | POA: Diagnosis not present

## 2023-06-12 DIAGNOSIS — D649 Anemia, unspecified: Secondary | ICD-10-CM | POA: Diagnosis not present

## 2023-06-12 DIAGNOSIS — E559 Vitamin D deficiency, unspecified: Secondary | ICD-10-CM | POA: Diagnosis not present

## 2023-06-12 DIAGNOSIS — I480 Paroxysmal atrial fibrillation: Secondary | ICD-10-CM | POA: Diagnosis not present

## 2023-06-14 DIAGNOSIS — E1122 Type 2 diabetes mellitus with diabetic chronic kidney disease: Secondary | ICD-10-CM | POA: Diagnosis not present

## 2023-06-14 DIAGNOSIS — G894 Chronic pain syndrome: Secondary | ICD-10-CM | POA: Diagnosis not present

## 2023-06-14 DIAGNOSIS — I480 Paroxysmal atrial fibrillation: Secondary | ICD-10-CM | POA: Diagnosis not present

## 2023-06-16 DIAGNOSIS — F4323 Adjustment disorder with mixed anxiety and depressed mood: Secondary | ICD-10-CM | POA: Diagnosis not present

## 2023-06-16 DIAGNOSIS — N179 Acute kidney failure, unspecified: Secondary | ICD-10-CM | POA: Diagnosis not present

## 2023-06-16 DIAGNOSIS — F4321 Adjustment disorder with depressed mood: Secondary | ICD-10-CM | POA: Diagnosis not present

## 2023-06-19 DIAGNOSIS — D500 Iron deficiency anemia secondary to blood loss (chronic): Secondary | ICD-10-CM | POA: Diagnosis not present

## 2023-06-19 DIAGNOSIS — E649 Sequelae of unspecified nutritional deficiency: Secondary | ICD-10-CM | POA: Diagnosis not present

## 2023-06-20 DIAGNOSIS — I129 Hypertensive chronic kidney disease with stage 1 through stage 4 chronic kidney disease, or unspecified chronic kidney disease: Secondary | ICD-10-CM | POA: Diagnosis not present

## 2023-06-20 DIAGNOSIS — I2510 Atherosclerotic heart disease of native coronary artery without angina pectoris: Secondary | ICD-10-CM | POA: Diagnosis not present

## 2023-06-20 DIAGNOSIS — Z433 Encounter for attention to colostomy: Secondary | ICD-10-CM | POA: Diagnosis not present

## 2023-06-20 DIAGNOSIS — E1122 Type 2 diabetes mellitus with diabetic chronic kidney disease: Secondary | ICD-10-CM | POA: Diagnosis not present

## 2023-06-20 DIAGNOSIS — E785 Hyperlipidemia, unspecified: Secondary | ICD-10-CM | POA: Diagnosis not present

## 2023-06-20 DIAGNOSIS — M1A38X0 Chronic gout due to renal impairment, vertebrae, without tophus (tophi): Secondary | ICD-10-CM | POA: Diagnosis not present

## 2023-06-20 DIAGNOSIS — F4323 Adjustment disorder with mixed anxiety and depressed mood: Secondary | ICD-10-CM | POA: Diagnosis not present

## 2023-06-20 DIAGNOSIS — Q2730 Arteriovenous malformation, site unspecified: Secondary | ICD-10-CM | POA: Diagnosis not present

## 2023-06-20 DIAGNOSIS — I480 Paroxysmal atrial fibrillation: Secondary | ICD-10-CM | POA: Diagnosis not present

## 2023-06-20 DIAGNOSIS — F411 Generalized anxiety disorder: Secondary | ICD-10-CM | POA: Diagnosis not present

## 2023-06-20 DIAGNOSIS — I509 Heart failure, unspecified: Secondary | ICD-10-CM | POA: Diagnosis not present

## 2023-06-20 DIAGNOSIS — N1831 Chronic kidney disease, stage 3a: Secondary | ICD-10-CM | POA: Diagnosis not present

## 2023-06-20 DIAGNOSIS — Z6841 Body Mass Index (BMI) 40.0 and over, adult: Secondary | ICD-10-CM | POA: Diagnosis not present

## 2023-06-20 DIAGNOSIS — G894 Chronic pain syndrome: Secondary | ICD-10-CM | POA: Diagnosis not present

## 2023-06-28 DIAGNOSIS — M1A38X0 Chronic gout due to renal impairment, vertebrae, without tophus (tophi): Secondary | ICD-10-CM | POA: Diagnosis not present

## 2023-06-28 DIAGNOSIS — I509 Heart failure, unspecified: Secondary | ICD-10-CM | POA: Diagnosis not present

## 2023-06-28 DIAGNOSIS — E785 Hyperlipidemia, unspecified: Secondary | ICD-10-CM | POA: Diagnosis not present

## 2023-06-28 DIAGNOSIS — R112 Nausea with vomiting, unspecified: Secondary | ICD-10-CM | POA: Diagnosis not present

## 2023-07-06 DIAGNOSIS — F4321 Adjustment disorder with depressed mood: Secondary | ICD-10-CM | POA: Diagnosis not present

## 2023-07-06 DIAGNOSIS — F411 Generalized anxiety disorder: Secondary | ICD-10-CM | POA: Diagnosis not present

## 2023-07-07 ENCOUNTER — Other Ambulatory Visit: Payer: Self-pay | Admitting: Nurse Practitioner

## 2023-07-10 ENCOUNTER — Telehealth: Payer: Self-pay

## 2023-07-10 NOTE — Transitions of Care (Post Inpatient/ED Visit) (Signed)
 07/10/2023  Name: Bradley Torres MRN: [id number] DOB: November 09, 1960  Today's TOC FU Call Status: Today's TOC FU Call Status:: Successful TOC FU Call Completed TOC FU Call Complete Date: 07/10/23 Patient's Name and Date of Birth confirmed.  Transition Care Management Follow-up Telephone Call Date of Discharge: 07/07/23 Discharge Facility: Other [facility]) Name of Other (Non[facility]) Discharge Facility: [facility] Type of Discharge: Inpatient Admission Primary Inpatient Discharge Diagnosis:: gout How have you been since you were released from the hospital?: Better Any questions or concerns?: No  Items Reviewed: Did you receive and understand the discharge instructions provided?: Yes Medications obtained,verified, and reconciled?: Yes (Medications Reviewed) Any new allergies since your discharge?: No Dietary orders reviewed?: Yes Do you have support at home?: No  Medications Reviewed Today: Medications Reviewed Today     Reviewed by Karena Addison, LPN (Licensed Practical Nurse) on 07/10/23 at 1448  Med List Status: <None>   Medication Order Taking? Sig Documenting Provider Last Dose Status Informant  allopurinol (ZYLOPRIM) 100 MG tablet [id number] No TAKE ONE TABLET BY MOUTH ONCE DAILY Mechele Claude, MD 05/23/2023 Morning Active Self, Pharmacy Records  amiodarone (PACERONE) 200 MG tablet [id number] No Take 1 tablet (200 mg total) by mouth daily. Antoine Poche, MD 05/23/2023 Morning Active Self, Pharmacy Records  atorvastatin (LIPITOR) 20 MG tablet [id number] No TAKE ONE TABLET BY MOUTH ONCE DAILY Antoine Poche, MD 05/23/2023 Morning Active Self, Pharmacy Records  Cholecalciferol (VITAMIN D-3 PO) [id number] No Take 1 tablet by mouth daily. [provider] 05/23/2023 Morning Active Self, Pharmacy Records  dapagliflozin propanediol (FARXIGA) 10 MG TABS tablet [id number] No TAKE ONE TABLET BY MOUTH DAILY BEFORE Aundria Rud, MD 05/23/2023 Morning Active  Self, Pharmacy Records  JANUVIA 100 MG tablet [id number] No TAKE ONE TABLET BY MOUTH ONCE DAILY Mechele Claude, MD 05/23/2023 Morning Active Self, Pharmacy Records  metFORMIN (GLUCOPHAGE-XR) 750 MG 24 hr tablet [id number] No Take 2 tablets (1,500 mg total) by mouth daily with breakfast.  Patient taking differently: Take 750 mg by mouth daily with breakfast.   Mechele Claude, MD 05/23/2023 Morning Active Self, Pharmacy Records  oxyCODONE-acetaminophen (PERCOCET) 10-325 MG tablet [id number]  Take 1 tablet by mouth every 6 (six) hours as needed for up to 4 doses for pain. Lanae Boast, MD  Active   pantoprazole (PROTONIX) 40 MG tablet [id number]  Take 1 tablet (40 mg total) by mouth daily. Lanae Boast, MD  Active   polyethylene glycol (MIRALAX / GLYCOLAX) 17 g packet [id number] No Take 17 g by mouth daily.  Patient taking differently: Take 17 g by mouth daily as needed (constipation.).   Osvaldo Shipper, MD Past Week Active Self, Pharmacy Records  Prasterone, DHEA, (DHEA PO) [id number] No Take 1 capsule by mouth daily. [provider] 05/23/2023 Morning Active Self, Pharmacy Records  rivaroxaban (XARELTO) 20 MG TABS tablet [id number] No TAKE ONE TABLET BY MOUTH DAILY WITH SUPPER Mechele Claude, MD 05/23/2023 11:30 AM Active Self, Pharmacy Records            Home Care and Equipment/Supplies: Were Home Health Services Ordered?: NA Any new equipment or medical supplies ordered?: NA  Functional Questionnaire: Do you need assistance with bathing/showering or dressing?: No Do you need assistance with meal preparation?: No Do you need assistance with eating?: No Do you have difficulty maintaining continence: No Do you need assistance with getting out of bed/getting out of a chair/moving?: No Do you have difficulty managing or taking your medications?: No  Follow up appointments reviewed: PCP  Follow-up appointment confirmed?: Yes Date of PCP follow-up appointment?: 07/19/23 Follow-up Provider:  [facility] Follow-up appointment confirmed?: NA Do you need transportation to your follow-up appointment?: No Do you understand care options if your condition(s) worsen?: Yes-patient verbalized understanding    SIGNATURE Darrall Ellison, LPN [facility] Nurse Health Advisor Direct Dial [phone]

## 2023-07-11 DIAGNOSIS — M539 Dorsopathy, unspecified: Secondary | ICD-10-CM | POA: Diagnosis not present

## 2023-07-11 DIAGNOSIS — Z6841 Body Mass Index (BMI) 40.0 and over, adult: Secondary | ICD-10-CM | POA: Diagnosis not present

## 2023-07-11 DIAGNOSIS — E1165 Type 2 diabetes mellitus with hyperglycemia: Secondary | ICD-10-CM | POA: Diagnosis not present

## 2023-07-11 DIAGNOSIS — Z79899 Other long term (current) drug therapy: Secondary | ICD-10-CM | POA: Diagnosis not present

## 2023-07-11 DIAGNOSIS — R030 Elevated blood-pressure reading, without diagnosis of hypertension: Secondary | ICD-10-CM | POA: Diagnosis not present

## 2023-07-13 DIAGNOSIS — Z79899 Other long term (current) drug therapy: Secondary | ICD-10-CM | POA: Diagnosis not present

## 2023-07-19 ENCOUNTER — Encounter: Payer: Self-pay | Admitting: Family Medicine

## 2023-07-19 ENCOUNTER — Ambulatory Visit: Admitting: Family Medicine

## 2023-07-19 VITALS — BP 135/85 | HR 75 | Temp 97.7°F | Ht 72.0 in | Wt 326.0 lb

## 2023-07-19 DIAGNOSIS — S31109A Unspecified open wound of abdominal wall, unspecified quadrant without penetration into peritoneal cavity, initial encounter: Secondary | ICD-10-CM | POA: Diagnosis not present

## 2023-07-19 DIAGNOSIS — I480 Paroxysmal atrial fibrillation: Secondary | ICD-10-CM | POA: Diagnosis not present

## 2023-07-19 NOTE — Progress Notes (Signed)
 Subjective:  Patient ID: Bradley Torres, male    DOB: [birth year]  Age: 63 y.o. MRN: [id number]  CC: Hospitalization Follow-up (Pt already followed up with surgery. They did not tell him much other than to get primary to refer to specialist for both incisions on abdomin.)   HPI Bradley Torres presents for follow-up of hospitalization and rehab stay.  He underwent resection of the colon and small intestine due to adhesions.  He had to have a colostomy subsequent to that.  Since leaving the hospital he has been in rehab to help with wound care for 2 deeply dehisced surgical incisions parallel to the colostomy.  The 1 to the right of the colostomy near the midline of the abdomen is much deeper than the other.  The patient is dressing those at home himself since his discharge on April 14.  Hospital admission was from February 25 until March 15.  From March 15 until April 14 he was in rehab.  Patient expresses concern for the abdominal lesions/wounds.  He would like to have a wound care physician taking care of this with him.  He may also need home health to dress these.  Additionally he does not know how to care for the ostomy.  He has not been trained.  He has different sizes of ostomy bags and he does not know when to change them or which ones he should use or what supplies he needs to do that.    02/21/2023    3:43 PM 08/02/2022    3:11 PM 07/19/2022    2:46 PM  Depression screen PHQ 2/9  Decreased Interest 0 0 0  Down, Depressed, Hopeless 0 0 0  PHQ - 2 Score 0 0 0    History Bradley Torres has a past medical history of CAD (coronary artery disease), Chronic back pain, Diabetes mellitus, type 2 (HCC), DVT (deep venous thrombosis) (HCC), Essential hypertension, Family hx of colon cancer (05/03/2017), MI (myocardial infarction) (HCC) (2019), Morbid obesity (HCC), Panic disorder, Shoulder pain, left, Sleep apnea, Stroke (HCC) (01/2018), and Vitamin D  deficiency.   He has a past surgical history that  includes Lumbar spine surgery; Ankle fracture sugery (Left); Tonsillectomy; Colonoscopy with propofol  (N/A, 05/29/2017); polypectomy (05/29/2017); LEFT HEART CATH AND CORONARY ANGIOGRAPHY (N/A, 01/23/2018); Colonoscopy with propofol  (N/A, 07/20/2020); biopsy (07/20/2020); Interventional radiology procedure (10/08/2022); IR Radiologist Eval & Mgmt (10/26/2022); IR Radiologist Eval & Mgmt (01/04/2023); IR Catheter Tube Change (01/16/2023); IR Radiologist Eval & Mgmt (01/30/2023); IR Radiologist Eval & Mgmt (02/17/2023); IR Catheter Tube Change (05/23/2023); laparotomy (N/A, 05/31/2023); Colon resection sigmoid (N/A, 05/31/2023); Colostomy (N/A, 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   His family history includes Colon cancer in his mother; Heart attack in his father and paternal grandfather; Hepatitis (age of onset: 62) in his sister.He reports that he has never smoked. He has never been exposed to tobacco smoke. He has never used smokeless tobacco. He reports current alcohol use. He reports that he does not currently use drugs after having used the following drugs: Marijuana.    ROS Review of Systems  Constitutional: Negative.   HENT: Negative.    Eyes:  Negative for visual disturbance.  Respiratory:  Negative for cough and shortness of breath.   Cardiovascular:  Negative for chest pain and leg swelling.  Gastrointestinal:  Negative for abdominal pain, diarrhea, nausea and vomiting.  Genitourinary:  Negative for difficulty urinating.  Musculoskeletal:  Negative for arthralgias and myalgias.  Skin:  Negative for rash.  Neurological:  Negative for headaches.  Psychiatric/Behavioral:  Negative for sleep disturbance.     Objective:  BP 135/85   Pulse 75   Temp 97.7 F (36.5 C)   Ht 6' (1.829 m)   Wt (!) 326 lb (147.9 kg)   SpO2 97%   BMI 44.21 kg/m   BP  Readings from Last 3 Encounters:  07/19/23 135/85  06/10/23 (!) 133/53  05/11/23 131/82    Wt Readings from Last 3 Encounters:  07/19/23 (!) 326 lb (147.9 kg)  06/10/23 (!) 332 lb 10.8 oz (150.9 kg)  05/11/23 (!) 345 lb (156.5 kg)     Physical Exam Vitals reviewed.  Constitutional:      Appearance: He is well-developed.  HENT:     Head: Normocephalic and atraumatic.     Right Ear: External ear normal.     Left Ear: External ear normal.     Mouth/Throat:     Pharynx: No oropharyngeal exudate or posterior oropharyngeal erythema.  Eyes:     Pupils: Pupils are equal, round, and reactive to light.  Cardiovascular:     Rate and Rhythm: Normal rate and regular rhythm.     Heart sounds: No murmur heard. Pulmonary:     Effort: No respiratory distress.     Breath sounds: Normal breath sounds.  Musculoskeletal:     Cervical back: Normal range of motion and neck supple.  Skin:    Findings: Lesion (There are 2 deeply open wounds, grade 4 on the right side of the ostomy toward the midline of the abdomen appearing to be about 10 cm in length.  Another smaller less deep about 4 cm.  Both of these need to be dressed  with multiple layer dressings.) present.  Neurological:     Mental Status: He is alert and oriented to person, place, and time.      Assessment & Plan:  Open wound of anterior abdominal wall, initial encounter -     Amb Referral to Ostomy Clinic -     CBC with Differential/Platelet -     Brain natriuretic peptide -     CMP14+EGFR -     AMB referral to wound care center  Paroxysmal atrial fibrillation (HCC) -     CBC with Differential/Platelet -     Brain natriuretic peptide -     CMP14+EGFR     Follow-up: No follow-ups on file.  Roise Cleaver, M.D.

## 2023-07-20 LAB — CBC WITH DIFFERENTIAL/PLATELET
Basophils Absolute: 0.1 10*3/uL (ref 0.0–0.2)
Basos: 1 %
EOS (ABSOLUTE): 0.3 10*3/uL (ref 0.0–0.4)
Eos: 4 %
Hematocrit: 40.5 % (ref 37.5–51.0)
Hemoglobin: 13.1 g/dL (ref 13.0–17.7)
Immature Grans (Abs): 0.1 10*3/uL (ref 0.0–0.1)
Immature Granulocytes: 1 %
Lymphocytes Absolute: 0.9 10*3/uL (ref 0.7–3.1)
Lymphs: 11 %
MCH: 30.5 pg (ref 26.6–33.0)
MCHC: 32.3 g/dL (ref 31.5–35.7)
MCV: 94 fL (ref 79–97)
Monocytes Absolute: 0.7 10*3/uL (ref 0.1–0.9)
Monocytes: 9 %
Neutrophils Absolute: 6.1 10*3/uL (ref 1.4–7.0)
Neutrophils: 74 %
Platelets: 310 10*3/uL (ref 150–450)
RBC: 4.3 x10E6/uL (ref 4.14–5.80)
RDW: 15.1 % (ref 11.6–15.4)
WBC: 8.1 10*3/uL (ref 3.4–10.8)

## 2023-07-20 LAB — CMP14+EGFR
ALT: 19 IU/L (ref 0–44)
AST: 16 IU/L (ref 0–40)
Albumin: 3.8 g/dL — ABNORMAL LOW (ref 3.9–4.9)
Alkaline Phosphatase: 92 IU/L (ref 44–121)
BUN/Creatinine Ratio: 10 (ref 10–24)
BUN: 12 mg/dL (ref 8–27)
Bilirubin Total: 0.3 mg/dL (ref 0.0–1.2)
CO2: 25 mmol/L (ref 20–29)
Calcium: 9.5 mg/dL (ref 8.6–10.2)
Chloride: 102 mmol/L (ref 96–106)
Creatinine, Ser: 1.22 mg/dL (ref 0.76–1.27)
Globulin, Total: 2.8 g/dL (ref 1.5–4.5)
Glucose: 132 mg/dL — ABNORMAL HIGH (ref 70–99)
Potassium: 4 mmol/L (ref 3.5–5.2)
Sodium: 142 mmol/L (ref 134–144)
Total Protein: 6.6 g/dL (ref 6.0–8.5)
eGFR: 67 mL/min/{1.73_m2} (ref >59)

## 2023-07-20 LAB — BRAIN NATRIURETIC PEPTIDE: BNP: 92.5 pg/mL (ref 0.0–100.0)

## 2023-07-23 NOTE — Progress Notes (Signed)
Hello Orlandus,  Your lab result is normal and/or stable.Some minor variations that are not significant are commonly marked abnormal, but do not represent any medical problem for you.  Best regards, Min Collymore, M.D.

## 2023-07-24 ENCOUNTER — Ambulatory Visit (HOSPITAL_COMMUNITY)
Admission: RE | Admit: 2023-07-24 | Discharge: 2023-07-24 | Disposition: A | Discharge status: Home / Self Care | Source: Ambulatory Visit | Attending: Plastic Surgery | Admitting: Plastic Surgery

## 2023-07-24 DIAGNOSIS — L24B3 Irritant contact dermatitis related to fecal or urinary stoma or fistula: Secondary | ICD-10-CM | POA: Insufficient documentation

## 2023-07-24 DIAGNOSIS — Z550 Illiteracy and low-level literacy: Secondary | ICD-10-CM | POA: Diagnosis not present

## 2023-07-24 DIAGNOSIS — Z433 Encounter for attention to colostomy: Secondary | ICD-10-CM | POA: Diagnosis not present

## 2023-07-24 DIAGNOSIS — S31109A Unspecified open wound of abdominal wall, unspecified quadrant without penetration into peritoneal cavity, initial encounter: Secondary | ICD-10-CM | POA: Diagnosis present

## 2023-07-24 DIAGNOSIS — T8189XD Other complications of procedures, not elsewhere classified, subsequent encounter: Secondary | ICD-10-CM | POA: Diagnosis not present

## 2023-07-24 NOTE — Progress Notes (Signed)
 [facility]   Reason for visit:  LUQ colostomy, 2 surgical wounds healing to anterior abdomen.   HPI:  Repair EC fistula, colostomy Past Medical History:  Diagnosis Date   CAD (coronary artery disease)    a. 12/2017: cath showing 10% Proximal-LAD stenosis with no significant obstructive disease and LVEDP mildly elevated at 18 mm Hg.    Chronic back pain    Diabetes mellitus, type 2 (HCC)    DVT (deep venous thrombosis) (HCC)    Essential hypertension    Family hx of colon cancer 05/03/2017   MI (myocardial infarction) (HCC) 2019   Morbid obesity (HCC)    Panic disorder    Shoulder pain, left    Following fall   Sleep apnea    Stroke [facility]) 01/2018   Vitamin D  deficiency    Family History  Problem Relation Age of Onset   Colon cancer Mother        died at age 69   Heart attack Father    Hepatitis Sister 70       Autoimmune   Heart attack Paternal Grandfather    Allergies  Allergen Reactions   Aldactone  [Spironolactone ] Hives   Bystolic  [Nebivolol  Hcl] Swelling   Motrin [Ibuprofen] Other (See Comments)    Rectal bleed   Actos  [Pioglitazone ] Other (See Comments) and Cough    Dyspnea  Edema   Trulicity  [Dulaglutide ] Other (See Comments) and Cough    Shortness of breath GI intolerance   Zestril  [Lisinopril ] Other (See Comments) and Cough    Wheezing    Cozaar  [Losartan ] Rash   Diflucan  [Fluconazole ] Rash   Hctz [Hydrochlorothiazide ] Other (See Comments)    Gout flares.   Norvasc  [Amlodipine ] Itching and Other (See Comments)    Myalgias    Current Outpatient Medications  Medication Sig Dispense Refill Last Dose/Taking   allopurinol  (ZYLOPRIM ) 100 MG tablet TAKE ONE TABLET BY MOUTH ONCE DAILY 90 tablet 0    amiodarone  (PACERONE ) 200 MG tablet Take 1 tablet (200 mg total) by mouth daily. 90 tablet 1    atorvastatin  (LIPITOR ) 20 MG tablet TAKE ONE TABLET BY MOUTH ONCE DAILY 90 tablet 3    Cholecalciferol (VITAMIN D -3 PO) Take 1 tablet by mouth daily.       dapagliflozin  propanediol (FARXIGA ) 10 MG TABS tablet TAKE ONE TABLET BY MOUTH DAILY BEFORE BREAKFAST 90 tablet 0    JANUVIA  100 MG tablet TAKE ONE TABLET BY MOUTH ONCE DAILY 90 tablet 0    metFORMIN  (GLUCOPHAGE -XR) 750 MG 24 hr tablet Take 2 tablets (1,500 mg total) by mouth daily with breakfast. (Patient taking differently: Take 750 mg by mouth daily with breakfast.) 180 tablet 1    oxyCODONE -acetaminophen  (PERCOCET) 10-325 MG tablet Take 1 tablet by mouth every 6 (six) hours as needed for up to 4 doses for pain. 4 tablet 0    pantoprazole  (PROTONIX ) 40 MG tablet Take 1 tablet (40 mg total) by mouth daily.      polyethylene glycol (MIRALAX  / GLYCOLAX ) 17 g packet Take 17 g by mouth daily. (Patient taking differently: Take 17 g by mouth daily as needed (constipation.).) 30 each 0    Prasterone, DHEA, (DHEA PO) Take 1 capsule by mouth daily.      rivaroxaban  (XARELTO ) 20 MG TABS tablet TAKE ONE TABLET BY MOUTH DAILY WITH SUPPER 90 tablet 0    No current facility-administered medications for this encounter.   ROS  Review of Systems  Constitutional:  Positive for fatigue.  Cardiovascular:  CAD Hypertension  Gastrointestinal:        Fistula repair Colostomy   Musculoskeletal:  Positive for gait problem.  Psychiatric/Behavioral: Negative.    All other systems reviewed and are negative.  Vital signs:  There were no vitals taken for this visit. Exam:  Physical Exam Constitutional:      Appearance: He is obese.  HENT:     Mouth/Throat:     Mouth: Mucous membranes are moist.  Cardiovascular:     Heart sounds: Normal heart sounds.  Pulmonary:     Breath sounds: Normal breath sounds.  Abdominal:     Palpations: Abdomen is soft.     Comments: Obese abdomen LUQ colostomy 2 nonhealing surgical wounds.   Skin:    General: Skin is warm and dry.     Findings: Erythema present.     Comments: Peristomal skin breakdown 2 open abdominal wounds, improving  Neurological:      Mental Status: He is alert. Mental status is at baseline. He is disoriented.  Psychiatric:        Mood and Affect: Mood normal.        Behavior: Behavior normal.     Stoma type/location:  LUQ colostomy  pink and moist  Stomal assessment/size:  1 3/8" pink moist and slightly budded  Peristomal assessment:  denuded skin, having some leaks.  Is using convatec pouches.  He paid out of pocket for these when he exhausted his supply but  prefers [employer] I will set up with [employer] for supplies.  Treatment options for stomal/peristomal skin: stoma powder and skin prep barrier ring   switching to convex barrier and adding ostomy belt Output: soft brown stool Ostomy pouching: 2pc. 2 1/4" convex barrier with barrier ring   Education provided:  we perform pouch change. He likes the new convex system and belt.  Feels more secure.  We will setup with [employer] for supply needs   Patient does not read or write, so he does not wish to have printed instructions.  I schedule and appointment to follow up in one week.  Continue NS gauze to 2 abdominal wounds. I have provided supplies for this as well.     Impression/dx  Colostomy Contact irritant dermatitis  Discussion  New convex barrier Protect skin with stoma powder and skin prep add belt Set up with [employer] for supplies.  Plan  See back 1 week.  Supplies provided to last until then.     Visit time: 55 minutes.   Branda Cain [name]-BC

## 2023-07-24 NOTE — Discharge Instructions (Signed)
 Switched to 2 piece convex  2 1/4"  Stoma powder and skin prep BArrier ring  Added belt.

## 2023-07-25 ENCOUNTER — Other Ambulatory Visit (HOSPITAL_COMMUNITY): Payer: Self-pay | Admitting: Nurse Practitioner

## 2023-07-25 DIAGNOSIS — L24B3 Irritant contact dermatitis related to fecal or urinary stoma or fistula: Secondary | ICD-10-CM

## 2023-07-25 DIAGNOSIS — Z433 Encounter for attention to colostomy: Secondary | ICD-10-CM | POA: Insufficient documentation

## 2023-07-25 DIAGNOSIS — T8189XD Other complications of procedures, not elsewhere classified, subsequent encounter: Secondary | ICD-10-CM

## 2023-07-25 DIAGNOSIS — K9400 Colostomy complication, unspecified: Secondary | ICD-10-CM

## 2023-07-25 DIAGNOSIS — T8189XA Other complications of procedures, not elsewhere classified, initial encounter: Secondary | ICD-10-CM | POA: Insufficient documentation

## 2023-07-28 DIAGNOSIS — S31102A Unspecified open wound of abdominal wall, epigastric region without penetration into peritoneal cavity, initial encounter: Secondary | ICD-10-CM | POA: Diagnosis not present

## 2023-07-28 DIAGNOSIS — Z933 Colostomy status: Secondary | ICD-10-CM | POA: Diagnosis not present

## 2023-07-31 ENCOUNTER — Ambulatory Visit: Admitting: Nurse Practitioner

## 2023-07-31 ENCOUNTER — Encounter: Payer: Self-pay | Admitting: Nurse Practitioner

## 2023-07-31 VITALS — BP 107/71 | HR 90 | Temp 97.6°F | Ht 72.0 in | Wt 327.4 lb

## 2023-07-31 DIAGNOSIS — Z933 Colostomy status: Secondary | ICD-10-CM | POA: Diagnosis not present

## 2023-07-31 DIAGNOSIS — L0292 Furuncle, unspecified: Secondary | ICD-10-CM

## 2023-07-31 DIAGNOSIS — E65 Localized adiposity: Secondary | ICD-10-CM

## 2023-07-31 DIAGNOSIS — S31102A Unspecified open wound of abdominal wall, epigastric region without penetration into peritoneal cavity, initial encounter: Secondary | ICD-10-CM | POA: Diagnosis not present

## 2023-07-31 MED ORDER — DOXYCYCLINE HYCLATE 100 MG PO CAPS: 100.0000 mg | ORAL_CAPSULE | Freq: Two times a day (BID) | ORAL | 0 refills | Status: DC

## 2023-07-31 MED ORDER — NYSTATIN 100000 UNIT/GM EX POWD: 1.0000 | Freq: Three times a day (TID) | CUTANEOUS | 2 refills | Status: AC

## 2023-07-31 NOTE — Progress Notes (Signed)
 Acute Office Visit  Subjective:     Patient ID: Bradley Torres, male    DOB: 09-01-1960, 63 y.o.   MRN: [id number]  Chief Complaint  Patient presents with   knot on testicles     Knot on right testicle since Saturday, swollen painful. Has hx of boils     HPI Bradley Torres is a 63 yrs old male present 07/31/2023 for an acute visit concerns for boils. 63 year old morbidly obese male presents for an acute visit due to recurrent, painful boils on the scrotum. The current episode began on Saturday and is associated with significant pain causing him to sit to urinate. He notes a history of similar episodes. Denies fever or chills at this time. Reports associated erythema and tenderness in the panniculus region. Denies trauma or new exposures. No dysuria or penile discharge reported.  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   Chronic heart failure with preserved ejection fraction (HFpEF) (HCC)    Chronic pain syndrome    Anticoagulated by anticoagulation treatment 03/06/2018   Hx of non-ST elevation myocardial infarction (NSTEMI) 03/06/2018   Dysphagia 10/17/2018   Primary osteoarthritis involving multiple joints 06/21/2019   Severe obstructive sleep apnea-hypopnea syndrome 08/01/2019   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   Arteriovenous malformation 08/01/2019   Sleep related hypoxia 11/01/2019   Vitamin D  deficiency    Type 2 diabetes mellitus with hyperglycemia, without  long-term current use of insulin  (HCC)    Paroxysmal atrial fibrillation (HCC) 12/31/2019   History of pulmonary embolus (PE) 10/24/2020   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/23/2021   Severe sepsis (HCC) 06/19/2022   Sigmoid diverticulitis 06/19/2022   QT prolongation 06/19/2022   Mixed hyperlipidemia 06/19/2022   Acute pulmonary embolism (HCC) 06/19/2022   History of DVT (deep vein thrombosis) 06/19/2022   Diverticulitis of large intestine with perforation and abscess 06/19/2022   Atrial fibrillation with RVR (HCC) 06/27/2022   Intra-abdominal abscess (HCC) 10/01/2022   Abdominal wall abscess 04/27/2023   Acute renal failure superimposed on stage 3 chronic kidney disease, unspecified acute renal failure type, unspecified whether stage 3a or 3b CKD (HCC) 05/23/2023   Malnutrition of moderate degree 05/26/2023   Hypotension 05/31/2023   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   Surgical wound, non healing 07/25/2023   Irritant contact dermatitis associated with fecal stoma 07/25/2023   Colostomy care (HCC) 07/25/2023   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   Recurrent boils 07/31/2023   Resolved Ambulatory Problems    Diagnosis Date Noted   Obesity, Class III, BMI 40-49.9 (morbid obesity) 01/23/2014   Prediabetes 11/15/2016   LLQ pain 03/14/2017   Bowel habit changes 03/14/2017   Family hx of colon cancer 05/03/2017   Injury of right shoulder 11/01/2017   Candidal intertrigo 11/01/2017   NSTEMI (non-ST elevated myocardial infarction) (HCC) 01/19/2018   Diarrhea 01/19/2018   Lactic acidosis 01/19/2018   Hypokalemia 01/19/2018   NSTEMI (non-ST elevation myocardial infarction) (HCC)    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   Upper airway cough syndrome 05/10/2018  Acute on chronic respiratory failure with hypoxemia (HCC) 06/05/2018   Epigastric burning sensation 10/17/2018   Severe obstructive sleep apnea 11/01/2019   COPD mixed type (HCC) 11/01/2019   History of chronic cough  11/01/2019   Hypertensive urgency 12/30/2019   Sleep apnea    Diverticulitis 06/19/2022   Endotracheally intubated 05/31/2023   Past Medical History:  Diagnosis Date   CAD (coronary artery disease)    Chronic back pain    Diabetes mellitus, type 2 (HCC)    DVT (deep venous thrombosis) (HCC)    Essential hypertension    MI (myocardial infarction) (HCC) 2019   Morbid obesity (HCC)    Panic disorder    Shoulder pain, left    Stroke (HCC) 01/2018    Review of Systems  Constitutional:  Negative for chills and fever.  HENT:  Negative for ear discharge and sore throat.   Cardiovascular:  Negative for chest pain and leg swelling.  Gastrointestinal:  Negative for constipation, diarrhea, nausea and vomiting.  Skin:  Negative for itching and rash.  Neurological:  Negative for dizziness and headaches.   Negative unless indicated in HPI    Objective:    BP 107/71   Pulse 90   Temp 97.6 F (36.4 C) (Temporal)   Ht 6' (1.829 m)   Wt (!) 327 lb 6.4 oz (148.5 kg)   SpO2 97%   BMI 44.40 kg/m  BP Readings from Last 3 Encounters:  07/31/23 107/71  07/19/23 135/85  06/10/23 (!) 133/53   Wt Readings from Last 3 Encounters:  07/31/23 (!) 327 lb 6.4 oz (148.5 kg)  07/19/23 (!) 326 lb (147.9 kg)  06/10/23 (!) 332 lb 10.8 oz (150.9 kg)      Physical Exam Vitals and nursing note reviewed.  Constitutional:      Appearance: He is morbidly obese.  HENT:     Head: Normocephalic and atraumatic.     Nose: Nose normal.     Mouth/Throat:     Mouth: Mucous membranes are moist.  Eyes:     General: No scleral icterus.    Extraocular Movements: Extraocular movements intact.     Conjunctiva/sclera: Conjunctivae normal.     Pupils: Pupils are equal, round, and reactive to light.  Cardiovascular:     Heart sounds: Normal heart sounds.  Pulmonary:     Effort: Pulmonary effort is normal.     Breath sounds: Normal breath sounds.  Skin:    General: Skin is warm and dry.     Comments: Boils  on scrotum  Neurological:     Mental Status: He is alert and oriented to person, place, and time.  Psychiatric:        Mood and Affect: Mood normal.        Behavior: Behavior normal.        Thought Content: Thought content normal.        Judgment: Judgment normal.     No results found for any visits on 07/31/23.      Assessment & Plan:  Recurrent boils -     Anaerobic and Aerobic Culture -     Doxycycline  Hyclate; Take 1 capsule (100 mg total) by mouth 2 (two) times daily for 10 days.  Dispense: 20 capsule; Refill: 0  Panniculus adiposus -     Nystatin ; Apply 1 Application topically 3 (three) times daily.  Dispense: 15 g; Refill: 2  Bradley Torres is 63 yrs old male seen today for boils,, no acute distress Boils: doxy 100  mg  for 100, wound culture pending Panniculus: Nystatin  powder BID, wash area and dry it well  Encourage healthy lifestyle choices, including diet (rich in fruits, vegetables, and lean proteins, and low in salt and simple carbohydrates) and exercise (at least 30 minutes of moderate physical activity daily).     The above assessment and management plan was discussed with the patient. The patient verbalized understanding of and has agreed to the management plan. Patient is aware to call the clinic if they develop any new symptoms or if symptoms persist or worsen. Patient is aware when to return to the clinic for a follow-up visit. Patient educated on when it is appropriate to go to the emergency department.   Return if symptoms worsen or fail to improve.  Sullivan Blasing [place] Thompson, DNP [facility] [address] [place], [place] [address] [phone]  Note: This document was prepared by [employer] voice dictation technology and any errors that results from this process are unintentional.

## 2023-08-01 ENCOUNTER — Ambulatory Visit (HOSPITAL_COMMUNITY): Admitting: Nurse Practitioner

## 2023-08-04 LAB — ANAEROBIC AND AEROBIC CULTURE

## 2023-08-07 ENCOUNTER — Ambulatory Visit: Payer: Self-pay

## 2023-08-07 DIAGNOSIS — R5383 Other fatigue: Secondary | ICD-10-CM | POA: Diagnosis not present

## 2023-08-07 DIAGNOSIS — Z79899 Other long term (current) drug therapy: Secondary | ICD-10-CM | POA: Diagnosis not present

## 2023-08-07 DIAGNOSIS — Z125 Encounter for screening for malignant neoplasm of prostate: Secondary | ICD-10-CM | POA: Diagnosis not present

## 2023-08-07 DIAGNOSIS — E1165 Type 2 diabetes mellitus with hyperglycemia: Secondary | ICD-10-CM | POA: Diagnosis not present

## 2023-08-07 NOTE — Telephone Encounter (Signed)
 Pt returning missed call. RN shared  NP notes to continue abx. If other info is needed please call pt to inform. CAL was attempted but Alisa App wasn't available. Please advise if necessary.            Copied from CRM [id number]. Topic: Clinical - Lab/Test Results >> Aug 07, 2023  3:31 PM Carrielelia G wrote: Reason for CRM:  patient calling regards to his lab results Reason for Disposition  [1] Caller requesting NON-URGENT health information AND [2] PCP's office is the best resource  Answer Assessment - Initial Assessment Questions 1. REASON FOR CALL or QUESTION: "What is your reason for calling today?" or "How can I best help you?" or "What question do you have that I can help answer?"     Pt missed call from office.No notes.  Protocols used: Information Only Call - No Triage-A-AH

## 2023-08-07 NOTE — Telephone Encounter (Signed)
 Documented in results. LS

## 2023-08-10 DIAGNOSIS — Z79899 Other long term (current) drug therapy: Secondary | ICD-10-CM | POA: Diagnosis not present

## 2023-08-14 ENCOUNTER — Ambulatory Visit (HOSPITAL_COMMUNITY)
Admission: RE | Admit: 2023-08-14 | Discharge: 2023-08-14 | Disposition: A | Discharge status: Home / Self Care | Source: Ambulatory Visit | Attending: *Deleted | Admitting: *Deleted

## 2023-08-14 DIAGNOSIS — Z933 Colostomy status: Secondary | ICD-10-CM | POA: Diagnosis present

## 2023-08-14 DIAGNOSIS — Z433 Encounter for attention to colostomy: Secondary | ICD-10-CM

## 2023-08-14 DIAGNOSIS — L24B3 Irritant contact dermatitis related to fecal or urinary stoma or fistula: Secondary | ICD-10-CM | POA: Insufficient documentation

## 2023-08-14 NOTE — Progress Notes (Signed)
 [facility]   Reason for visit:  LUQ colostomy HPI:  EC fistula repair Past Medical History:  Diagnosis Date   CAD (coronary artery disease)    a. 12/2017: cath showing 10% Proximal-LAD stenosis with no significant obstructive disease and LVEDP mildly elevated at 18 mm Hg.    Chronic back pain    Diabetes mellitus, type 2 (HCC)    DVT (deep venous thrombosis) (HCC)    Essential hypertension    Family hx of colon cancer 05/03/2017   MI (myocardial infarction) (HCC) 2019   Morbid obesity (HCC)    Panic disorder    Shoulder pain, left    Following fall   Sleep apnea    Stroke [facility]) 01/2018   Vitamin D  deficiency    Family History  Problem Relation Age of Onset   Colon cancer Mother        died at age 55   Heart attack Father    Hepatitis Sister 36       Autoimmune   Heart attack Paternal Grandfather    Allergies  Allergen Reactions   Aldactone  [Spironolactone ] Hives   Bystolic  [Nebivolol  Hcl] Swelling   Motrin [Ibuprofen] Other (See Comments)    Rectal bleed   Actos  [Pioglitazone ] Other (See Comments) and Cough    Dyspnea  Edema   Trulicity  [Dulaglutide ] Other (See Comments) and Cough    Shortness of breath GI intolerance   Zestril  [Lisinopril ] Other (See Comments) and Cough    Wheezing    Cozaar  [Losartan ] Rash   Diflucan  [Fluconazole ] Rash   Hctz [Hydrochlorothiazide ] Other (See Comments)    Gout flares.   Norvasc  [Amlodipine ] Itching and Other (See Comments)    Myalgias    Current Outpatient Medications  Medication Sig Dispense Refill Last Dose/Taking   allopurinol  (ZYLOPRIM ) 100 MG tablet TAKE ONE TABLET BY MOUTH ONCE DAILY 90 tablet 0    amiodarone  (PACERONE ) 200 MG tablet Take 1 tablet (200 mg total) by mouth daily. 90 tablet 1    atorvastatin  (LIPITOR ) 20 MG tablet TAKE ONE TABLET BY MOUTH ONCE DAILY 90 tablet 3    Cholecalciferol (VITAMIN D -3 PO) Take 1 tablet by mouth daily.      dapagliflozin  propanediol (FARXIGA ) 10 MG TABS tablet TAKE  ONE TABLET BY MOUTH DAILY BEFORE BREAKFAST 90 tablet 0    JANUVIA  100 MG tablet TAKE ONE TABLET BY MOUTH ONCE DAILY 90 tablet 0    metFORMIN  (GLUCOPHAGE -XR) 750 MG 24 hr tablet Take 2 tablets (1,500 mg total) by mouth daily with breakfast. (Patient taking differently: Take 750 mg by mouth daily with breakfast.) 180 tablet 1    nystatin  (MYCOSTATIN /NYSTOP ) powder Apply 1 Application topically 3 (three) times daily. 15 g 2    oxyCODONE -acetaminophen  (PERCOCET) 10-325 MG tablet Take 1 tablet by mouth every 6 (six) hours as needed for up to 4 doses for pain. 4 tablet 0    pantoprazole  (PROTONIX ) 40 MG tablet Take 1 tablet (40 mg total) by mouth daily.      polyethylene glycol (MIRALAX  / GLYCOLAX ) 17 g packet Take 17 g by mouth daily. (Patient taking differently: Take 17 g by mouth daily as needed (constipation.).) 30 each 0    Prasterone, DHEA, (DHEA PO) Take 1 capsule by mouth daily.      rivaroxaban  (XARELTO ) 20 MG TABS tablet TAKE ONE TABLET BY MOUTH DAILY WITH SUPPER 90 tablet 0    No current facility-administered medications for this encounter.   ROS  Review of Systems  Constitutional:  Positive for fatigue.  Respiratory:  Positive for shortness of breath.   Cardiovascular:  Positive for leg swelling.  Gastrointestinal:  Positive for abdominal pain.       Healing abdominal wounds, x 2.   LUQ colostomy   Musculoskeletal:  Positive for myalgias.  Skin:  Positive for color change, rash and wound.  Psychiatric/Behavioral:  Positive for dysphoric mood.   All other systems reviewed and are negative.  Vital signs:  BP (!) 152/81   Pulse 85   Temp (!) 97.4 F (36.3 C) (Oral)   SpO2 93%  Exam:  Physical Exam Vitals reviewed.  Constitutional:      Appearance: He is obese.  Cardiovascular:     Rate and Rhythm: Normal rate and regular rhythm.     Heart sounds: Normal heart sounds.  Abdominal:     Palpations: Abdomen is soft.  Skin:    Capillary Refill: Capillary refill takes 2 to 3  seconds.     Findings: Erythema and lesion present.     Comments: Recent flare up perineal abscesses/boils.  Is on antibiotic coverage.   Neurological:     Mental Status: He is alert and oriented to person, place, and time.  Psychiatric:        Behavior: Behavior normal.     Stoma type/location:  LUQ colostomy Stomal assessment/size:  1 1/2" pink flush and producing soft brown stool.   Peristomal assessment:  midline incision, improving  Peristomal breakdown has improved, nearly resolved Treatment options for stomal/peristomal skin: stoma powder and skin prep  barrier ring and convex 2 piece system.  Added ostomy belt, he feels this has helped with securing pouch  Output: soft brown stool Ostomy pouching: /2pc. Convex  [facility] is mailing supplies.   Education provided:  continue convex pouches.      Impression/dx  Colostomy complication Irritant contact dermatitis  Discussion  Continue pouching  Follow up with [facility] each month Plan  See back as needed    Visit time: 45 minutes.   Branda Cain [name]-BC

## 2023-08-19 NOTE — Discharge Instructions (Signed)
 No changes Follow up with [facility] for supplies each month

## 2023-08-24 ENCOUNTER — Telehealth: Payer: Self-pay

## 2023-08-24 NOTE — Telephone Encounter (Signed)
 Copied from CRM [id number]. Topic: Medical Record Request - Records Request >> Aug 24, 2023  4:21 PM [name] wrote: Reason for CRM: PT called in because he dropped off his paperwork on his visit 07/31/2023 and the paper work was suppposed to be filled out and faxed based on the information on the paperwork.   Pt states he received a letter denying his request because they never received the form filled out. Pt would like for someone to follow up with him  Pt callback [phone]

## 2023-08-24 NOTE — Telephone Encounter (Signed)
 Aware [employer] paperwork has been printed and re-faxed to [phone]

## 2023-08-28 ENCOUNTER — Ambulatory Visit (HOSPITAL_COMMUNITY): Admitting: Nurse Practitioner

## 2023-08-29 DIAGNOSIS — Z933 Colostomy status: Secondary | ICD-10-CM | POA: Diagnosis not present

## 2023-08-29 DIAGNOSIS — S31102A Unspecified open wound of abdominal wall, epigastric region without penetration into peritoneal cavity, initial encounter: Secondary | ICD-10-CM | POA: Diagnosis not present

## 2023-09-11 ENCOUNTER — Ambulatory Visit (HOSPITAL_COMMUNITY)
Admission: RE | Admit: 2023-09-11 | Discharge: 2023-09-11 | Disposition: A | Discharge status: Home / Self Care | Source: Ambulatory Visit | Attending: *Deleted | Admitting: *Deleted

## 2023-09-11 DIAGNOSIS — L24B3 Irritant contact dermatitis related to fecal or urinary stoma or fistula: Secondary | ICD-10-CM | POA: Diagnosis not present

## 2023-09-11 DIAGNOSIS — K9409 Other complications of colostomy: Secondary | ICD-10-CM | POA: Diagnosis not present

## 2023-09-11 DIAGNOSIS — Z433 Encounter for attention to colostomy: Secondary | ICD-10-CM | POA: Diagnosis not present

## 2023-09-11 NOTE — Progress Notes (Signed)
 [facility]   Reason for visit:  LUQ colostomy HPI:  EC fistula repair Past Medical History:  Diagnosis Date   CAD (coronary artery disease)    a. 12/2017: cath showing 10% Proximal-LAD stenosis with no significant obstructive disease and LVEDP mildly elevated at 18 mm Hg.    Chronic back pain    Diabetes mellitus, type 2 (HCC)    DVT (deep venous thrombosis) (HCC)    Essential hypertension    Family hx of colon cancer 05/03/2017   MI (myocardial infarction) (HCC) 2019   Morbid obesity (HCC)    Panic disorder    Shoulder pain, left    Following fall   Sleep apnea    Stroke [facility]) 01/2018   Vitamin D  deficiency    Family History  Problem Relation Age of Onset   Colon cancer Mother        died at age 102   Heart attack Father    Hepatitis Sister 59       Autoimmune   Heart attack Paternal Grandfather    Allergies  Allergen Reactions   Aldactone  [Spironolactone ] Hives   Bystolic  [Nebivolol  Hcl] Swelling   Motrin [Ibuprofen] Other (See Comments)    Rectal bleed   Actos  [Pioglitazone ] Other (See Comments) and Cough    Dyspnea  Edema   Trulicity  [Dulaglutide ] Other (See Comments) and Cough    Shortness of breath GI intolerance   Zestril  [Lisinopril ] Other (See Comments) and Cough    Wheezing    Cozaar  [Losartan ] Rash   Diflucan  [Fluconazole ] Rash   Hctz [Hydrochlorothiazide ] Other (See Comments)    Gout flares.   Norvasc  [Amlodipine ] Itching and Other (See Comments)    Myalgias    Current Outpatient Medications  Medication Sig Dispense Refill Last Dose/Taking   allopurinol  (ZYLOPRIM ) 100 MG tablet TAKE ONE TABLET BY MOUTH ONCE DAILY 90 tablet 0    amiodarone  (PACERONE ) 200 MG tablet Take 1 tablet (200 mg total) by mouth daily. 90 tablet 1    atorvastatin  (LIPITOR ) 20 MG tablet TAKE ONE TABLET BY MOUTH ONCE DAILY 90 tablet 3    Cholecalciferol (VITAMIN D -3 PO) Take 1 tablet by mouth daily.      dapagliflozin  propanediol (FARXIGA ) 10 MG TABS tablet TAKE  ONE TABLET BY MOUTH DAILY BEFORE BREAKFAST 90 tablet 0    JANUVIA  100 MG tablet TAKE ONE TABLET BY MOUTH ONCE DAILY 90 tablet 0    metFORMIN  (GLUCOPHAGE -XR) 750 MG 24 hr tablet Take 2 tablets (1,500 mg total) by mouth daily with breakfast. (Patient taking differently: Take 750 mg by mouth daily with breakfast.) 180 tablet 1    nystatin  (MYCOSTATIN /NYSTOP ) powder Apply 1 Application topically 3 (three) times daily. 15 g 2    oxyCODONE -acetaminophen  (PERCOCET) 10-325 MG tablet Take 1 tablet by mouth every 6 (six) hours as needed for up to 4 doses for pain. 4 tablet 0    pantoprazole  (PROTONIX ) 40 MG tablet Take 1 tablet (40 mg total) by mouth daily.      polyethylene glycol (MIRALAX  / GLYCOLAX ) 17 g packet Take 17 g by mouth daily. (Patient taking differently: Take 17 g by mouth daily as needed (constipation.).) 30 each 0    Prasterone, DHEA, (DHEA PO) Take 1 capsule by mouth daily.      rivaroxaban  (XARELTO ) 20 MG TABS tablet TAKE ONE TABLET BY MOUTH DAILY WITH SUPPER 90 tablet 0    No current facility-administered medications for this encounter.   ROS  Review of Systems  Constitutional:  Positive for fatigue.  Respiratory:  Positive for shortness of breath.        Shortness of breath with activity  Gastrointestinal:        LUQ colostomy Repair of EC fistula  Skin:  Positive for color change and wound.       Peristomal breakdown   Psychiatric/Behavioral: Negative.    All other systems reviewed and are negative.  Vital signs:  BP (!) 157/88 (BP Location: Right Arm)   Pulse 77   Temp 97.6 F (36.4 C) (Oral)   Resp 18   SpO2 94%  Exam:  Physical Exam Vitals reviewed.  Constitutional:      Appearance: He is obese.  HENT:     Mouth/Throat:     Mouth: Mucous membranes are moist.   Cardiovascular:     Rate and Rhythm: Normal rate and regular rhythm.  Pulmonary:     Breath sounds: Normal breath sounds.  Abdominal:     Palpations: Abdomen is soft.   Skin:    General: Skin is  warm and dry.     Findings: Erythema present.   Neurological:     Mental Status: He is alert and oriented to person, place, and time. Mental status is at baseline.   Psychiatric:        Mood and Affect: Mood normal.        Behavior: Behavior normal.     Stoma type/location:  LUQ colostomy Stomal assessment/size:  1 1/2 flush pink moist Peristomal assessment:  intact, some redness at times, comes and goes.  Distal end of pouching area is nonintact and weeping, appears to be from pouch rubbing. In addition, medical adhesive related skin injury  We will protect this with barrier strips. Treatment options for stomal/peristomal skin: stoma powder skin prep barrier ring 2 piece convex pouch system  Output: soft brown stool  Ostomy pouching: 2pc convex.  Education provided:  How to apply barrier strips to perimeter of pouch (instead of medical tape Wlll add to [facility] order     Impression/dx  Medical adhesive related skin injury Irritant contact dermatitis colostomy Discussion  Update orders with [facility] May switch to tapeless pouch system if breakdown continues Plan  See back 2 weeks to assess skin     Visit time: 45 minutes.   Darice Cooley [name]-BC

## 2023-09-14 DIAGNOSIS — M539 Dorsopathy, unspecified: Secondary | ICD-10-CM | POA: Diagnosis not present

## 2023-09-14 DIAGNOSIS — E1165 Type 2 diabetes mellitus with hyperglycemia: Secondary | ICD-10-CM | POA: Diagnosis not present

## 2023-09-14 DIAGNOSIS — Z79899 Other long term (current) drug therapy: Secondary | ICD-10-CM | POA: Diagnosis not present

## 2023-09-14 DIAGNOSIS — R030 Elevated blood-pressure reading, without diagnosis of hypertension: Secondary | ICD-10-CM | POA: Diagnosis not present

## 2023-09-14 DIAGNOSIS — Z6841 Body Mass Index (BMI) 40.0 and over, adult: Secondary | ICD-10-CM | POA: Diagnosis not present

## 2023-09-18 DIAGNOSIS — Z79899 Other long term (current) drug therapy: Secondary | ICD-10-CM | POA: Diagnosis not present

## 2023-09-27 NOTE — Progress Notes (Signed)
   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 I took him to the operating room as it was not improving with conservative therapy. I did a laparotomy with extensive lysis of adhesions and a left colectomy with a small bowel resection as well. I debrided an abdominal wall abscess and gave him an end colostomy. He returns today doing well. He is at home now. Wound healing well He is eating and his colostomy is functional. Pathology was consistent with diverticulitis.     Physical Examination:   Physical Exam   Wound healing, superficially open, I put silver nitrate on this, stoma pink and functional   Assessment and Plan:     He is doing great. He is up-to-date on his colonoscopy and we discussed that today.  I did apply some silver nitrate to his wounds today and these look really good right now. He is going to continue what he has been doing and I will see him back in 6 weeks. We discussed it would at least be 6 months before he had a colostomy takedown   MATTHEW CARLIN BURY, MD

## 2023-10-09 ENCOUNTER — Other Ambulatory Visit: Payer: Self-pay | Admitting: Nurse Practitioner

## 2023-10-09 ENCOUNTER — Other Ambulatory Visit: Payer: Self-pay | Admitting: Family Medicine

## 2023-10-09 DIAGNOSIS — Z86711 Personal history of pulmonary embolism: Secondary | ICD-10-CM

## 2023-10-09 DIAGNOSIS — E1165 Type 2 diabetes mellitus with hyperglycemia: Secondary | ICD-10-CM

## 2023-10-09 DIAGNOSIS — E1159 Type 2 diabetes mellitus with other circulatory complications: Secondary | ICD-10-CM

## 2023-10-12 DIAGNOSIS — S31102A Unspecified open wound of abdominal wall, epigastric region without penetration into peritoneal cavity, initial encounter: Secondary | ICD-10-CM | POA: Diagnosis not present

## 2023-10-12 DIAGNOSIS — Z933 Colostomy status: Secondary | ICD-10-CM | POA: Diagnosis not present

## 2023-10-13 DIAGNOSIS — R030 Elevated blood-pressure reading, without diagnosis of hypertension: Secondary | ICD-10-CM | POA: Diagnosis not present

## 2023-10-13 DIAGNOSIS — Z6841 Body Mass Index (BMI) 40.0 and over, adult: Secondary | ICD-10-CM | POA: Diagnosis not present

## 2023-10-13 DIAGNOSIS — R0602 Shortness of breath: Secondary | ICD-10-CM | POA: Diagnosis not present

## 2023-10-13 DIAGNOSIS — E1165 Type 2 diabetes mellitus with hyperglycemia: Secondary | ICD-10-CM | POA: Diagnosis not present

## 2023-10-13 DIAGNOSIS — M539 Dorsopathy, unspecified: Secondary | ICD-10-CM | POA: Diagnosis not present

## 2023-10-13 DIAGNOSIS — Z79899 Other long term (current) drug therapy: Secondary | ICD-10-CM | POA: Diagnosis not present

## 2023-10-18 ENCOUNTER — Encounter: Payer: Self-pay | Admitting: Family Medicine

## 2023-10-18 ENCOUNTER — Ambulatory Visit: Admitting: Family Medicine

## 2023-10-18 DIAGNOSIS — E1165 Type 2 diabetes mellitus with hyperglycemia: Secondary | ICD-10-CM

## 2023-10-18 DIAGNOSIS — E1159 Type 2 diabetes mellitus with other circulatory complications: Secondary | ICD-10-CM

## 2023-10-18 DIAGNOSIS — N1831 Chronic kidney disease, stage 3a: Secondary | ICD-10-CM

## 2023-10-18 DIAGNOSIS — I152 Hypertension secondary to endocrine disorders: Secondary | ICD-10-CM

## 2023-10-18 DIAGNOSIS — Z79899 Other long term (current) drug therapy: Secondary | ICD-10-CM | POA: Diagnosis not present

## 2023-10-18 DIAGNOSIS — M109 Gout, unspecified: Secondary | ICD-10-CM

## 2023-10-18 LAB — BAYER DCA HB A1C WAIVED: HB A1C (BAYER DCA - WAIVED): 5.5 % (ref 4.8–5.6)

## 2023-10-18 MED ORDER — ALLOPURINOL 100 MG PO TABS: 100.0000 mg | ORAL_TABLET | Freq: Every day | ORAL | 0 refills | Status: DC

## 2023-10-18 MED ORDER — DAPAGLIFLOZIN PROPANEDIOL 10 MG PO TABS: ORAL_TABLET | ORAL | 0 refills | Status: DC

## 2023-10-18 MED ORDER — METFORMIN HCL ER 750 MG PO TB24: 1500.0000 mg | ORAL_TABLET | Freq: Every day | ORAL | 1 refills | Status: DC

## 2023-10-18 MED ORDER — AMIODARONE HCL 200 MG PO TABS: 200.0000 mg | ORAL_TABLET | Freq: Every day | ORAL | 1 refills | Status: DC

## 2023-10-18 MED ORDER — CHLORTHALIDONE 25 MG PO TABS: 25.0000 mg | ORAL_TABLET | Freq: Every day | ORAL | 3 refills | Status: AC

## 2023-10-18 MED ORDER — HYDRALAZINE HCL 25 MG PO TABS
25.0000 mg | ORAL_TABLET | Freq: Three times a day (TID) | ORAL | Status: DC
Start: 2023-10-18 — End: 2023-10-23

## 2023-10-18 NOTE — Progress Notes (Signed)
 Subjective:  Patient ID: Bradley Torres, male    DOB: [birth year]  Age: 63 y.o. MRN: [id number]  CC: Medical Management of Chronic Issues   HPI Bradley Torres presents for recheck of open lesions in the abdomen.  He says the referrals never went through for him to get to wound center or health.  He has been doing all of his dressing changes on his own.  He uses hydroperoxide and a dry dressing.  The wounds have been slowly closing.  Ontinues to have pain in back , legs and shoulders. Knees and ankles. Seeing pain management monthly.  Follow-up of diabetes. Patient does not check blood sugar at home Patient denies symptoms such as polyuria, polydipsia, excessive hunger, nausea No significant hypoglycemic spells noted. Medications as noted below. Taking them regularly without complication/adverse reaction being reported today.    02/21/2023    3:43 PM 08/02/2022    3:11 PM 07/19/2022    2:46 PM  Depression screen PHQ 2/9  Decreased Interest 0 0 0  Down, Depressed, Hopeless 0 0 0  PHQ - 2 Score 0 0 0    History Bradley Torres has a past medical history of CAD (coronary artery disease), Chronic back pain, Diabetes mellitus, type 2 (HCC), DVT (deep venous thrombosis) (HCC), Essential hypertension, Family hx of colon cancer (05/03/2017), MI (myocardial infarction) (HCC) (2019), Morbid obesity (HCC), Panic disorder, Shoulder pain, left, Sleep apnea, Stroke (HCC) (01/2018), and Vitamin D  deficiency.   He has a past surgical history that includes Lumbar spine surgery; Ankle fracture sugery (Left); Tonsillectomy; Colonoscopy with propofol  (N/A, 05/29/2017); polypectomy (05/29/2017); LEFT HEART CATH AND CORONARY ANGIOGRAPHY (N/A, 01/23/2018); Colonoscopy with propofol  (N/A, 07/20/2020); biopsy (07/20/2020); Interventional radiology procedure (10/08/2022); IR Radiologist Eval & Mgmt (10/26/2022); IR Radiologist Eval & Mgmt (01/04/2023); IR Catheter Tube Change (01/16/2023); IR Radiologist Eval & Mgmt (01/30/2023);  IR Radiologist Eval & Mgmt (02/17/2023); IR Catheter Tube Change (05/23/2023); laparotomy (N/A, 05/31/2023); Colon resection sigmoid (N/A, 05/31/2023); Colostomy (N/A, 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   His family history includes Colon cancer in his mother; Heart attack in his father and paternal grandfather; Hepatitis (age of onset: 38) in his sister.He reports that he has never smoked. He has never been exposed to tobacco smoke. He has never used smokeless tobacco. He reports current alcohol use. He reports that he does not currently use drugs after having used the following drugs: Marijuana.    ROS Review of Systems  Constitutional:  Negative for fever.  Respiratory:  Negative for shortness of breath.   Cardiovascular:  Negative for chest pain.  Musculoskeletal:  Negative for arthralgias.  Skin:  Negative for rash.    Objective:  BP (!) 151/80   Pulse 69   Temp 97.8 F (36.6 C)   Ht 6' (1.829 m)   Wt (!) 356 lb (161.5 kg)   SpO2 97%   BMI 48.28 kg/m   BP Readings from Last 3 Encounters:  10/18/23 (!) 151/80  09/11/23 (!) 157/88  08/14/23 (!) 152/81    Wt Readings from Last 3 Encounters:  10/18/23 (!) 356 lb (161.5 kg)  07/31/23 (!) 327 lb 6.4 oz (148.5 kg)  07/19/23 (!) 326 lb (147.9 kg)     Physical Exam Vitals reviewed.  Constitutional:      Appearance: He is well-developed.  HENT:     Head: Normocephalic and atraumatic.     Right Ear:  External ear normal.     Left Ear: External ear normal.     Mouth/Throat:     Pharynx: No oropharyngeal exudate or posterior oropharyngeal erythema.  Eyes:     Pupils: Pupils are equal, round, and reactive to light.  Cardiovascular:     Rate and Rhythm: Normal rate and regular rhythm.     Heart sounds: No murmur heard. Pulmonary:     Effort: No respiratory distress.     Breath  sounds: Normal breath sounds.  Musculoskeletal:     Cervical back: Normal range of motion and neck supple.  Skin:    Findings: Lesion (6 cm open dehisced wound near the midline.  It is closed to the level of the dermis.  There are 2 closing punctures from Jackson-Pratt drains that have been removed these are nearly closed as well.  After inspection fresh dressings were applied.) present.  Neurological:     Mental Status: He is alert and oriented to person, place, and time.      Assessment & Plan:  Type 2 diabetes mellitus with hyperglycemia, without long-term current use of insulin  (HCC) -     Dapagliflozin  Propanediol; TAKE ONE TABLET BY MOUTH DAILY BEFORE BREAKFAST  Dispense: 90 tablet; Refill: 0 -     Allopurinol ; Take 1 tablet (100 mg total) by mouth daily.  Dispense: 90 tablet; Refill: 0 -     Microalbumin / creatinine urine ratio; Future -     Bayer DCA Hb A1c Waived -     CBC with Differential/Platelet -     CMP14+EGFR  Stage 3a chronic kidney disease (HCC) -     Dapagliflozin  Propanediol; TAKE ONE TABLET BY MOUTH DAILY BEFORE BREAKFAST  Dispense: 90 tablet; Refill: 0 -     CMP14+EGFR  Hypertension associated with diabetes (HCC) -     Allopurinol ; Take 1 tablet (100 mg total) by mouth daily.  Dispense: 90 tablet; Refill: 0  Gout without tophus -     Allopurinol ; Take 1 tablet (100 mg total) by mouth daily.  Dispense: 90 tablet; Refill: 0  Other orders -     metFORMIN  HCl ER; Take 2 tablets (1,500 mg total) by mouth daily with breakfast.  Dispense: 180 tablet; Refill: 1 -     Amiodarone  HCl; Take 1 tablet (200 mg total) by mouth daily.  Dispense: 90 tablet; Refill: 1 -     Chlorthalidone ; Take 1 tablet (25 mg total) by mouth daily.  Dispense: 90 tablet; Refill: 3 -     hydrALAZINE  HCl; Take 1 tablet (25 mg total) by mouth 3 (three) times daily.   Patient has been using hydrogen peroxide.  I suggest that he should discontinue this and use Neosporin and gauze only after  cleaning with warm soapy water  and rinsing and patting dry.  Follow-up: Return in about 3 months (around 01/18/2024) for diabetes.  Miskell Der, M.D.

## 2023-10-19 ENCOUNTER — Ambulatory Visit: Payer: Self-pay | Admitting: Family Medicine

## 2023-10-19 LAB — CBC WITH DIFFERENTIAL/PLATELET
Basophils Absolute: 0.1 x10E3/uL (ref 0.0–0.2)
Basos: 2 %
EOS (ABSOLUTE): 0.2 x10E3/uL (ref 0.0–0.4)
Eos: 4 %
Hematocrit: 41.9 % (ref 37.5–51.0)
Hemoglobin: 13.4 g/dL (ref 13.0–17.7)
Immature Grans (Abs): 0.1 x10E3/uL (ref 0.0–0.1)
Immature Granulocytes: 1 %
Lymphocytes Absolute: 0.8 x10E3/uL (ref 0.7–3.1)
Lymphs: 14 %
MCH: 29.9 pg (ref 26.6–33.0)
MCHC: 32 g/dL (ref 31.5–35.7)
MCV: 94 fL (ref 79–97)
Monocytes Absolute: 0.6 x10E3/uL (ref 0.1–0.9)
Monocytes: 9 %
Neutrophils Absolute: 4.1 x10E3/uL (ref 1.4–7.0)
Neutrophils: 70 %
Platelets: 295 x10E3/uL (ref 150–450)
RBC: 4.48 x10E6/uL (ref 4.14–5.80)
RDW: 14.6 % (ref 11.6–15.4)
WBC: 5.9 x10E3/uL (ref 3.4–10.8)

## 2023-10-19 LAB — CMP14+EGFR
ALT: 14 IU/L (ref 0–44)
AST: 15 IU/L (ref 0–40)
Albumin: 4 g/dL (ref 3.9–4.9)
Alkaline Phosphatase: 107 IU/L (ref 44–121)
BUN/Creatinine Ratio: 11 (ref 10–24)
BUN: 17 mg/dL (ref 8–27)
Bilirubin Total: 0.5 mg/dL (ref 0.0–1.2)
CO2: 25 mmol/L (ref 20–29)
Calcium: 9.3 mg/dL (ref 8.6–10.2)
Chloride: 103 mmol/L (ref 96–106)
Creatinine, Ser: 1.51 mg/dL — ABNORMAL HIGH (ref 0.76–1.27)
Globulin, Total: 3.1 g/dL (ref 1.5–4.5)
Glucose: 109 mg/dL — ABNORMAL HIGH (ref 70–99)
Potassium: 4.5 mmol/L (ref 3.5–5.2)
Sodium: 143 mmol/L (ref 134–144)
Total Protein: 7.1 g/dL (ref 6.0–8.5)
eGFR: 52 mL/min/1.73 — ABNORMAL LOW (ref >59)

## 2023-10-20 ENCOUNTER — Other Ambulatory Visit: Payer: Self-pay

## 2023-10-20 DIAGNOSIS — N1831 Chronic kidney disease, stage 3a: Secondary | ICD-10-CM

## 2023-10-23 ENCOUNTER — Telehealth: Payer: Self-pay

## 2023-10-23 ENCOUNTER — Other Ambulatory Visit: Payer: Self-pay | Admitting: Family Medicine

## 2023-10-23 ENCOUNTER — Other Ambulatory Visit: Payer: Self-pay

## 2023-10-23 MED ORDER — HYDRALAZINE HCL 25 MG PO TABS: 25.0000 mg | ORAL_TABLET | Freq: Three times a day (TID) | ORAL | 1 refills | Status: DC

## 2023-10-23 NOTE — Telephone Encounter (Signed)
 Copied from CRM [id number]. Topic: Clinical - Medication Question >> Oct 20, 2023  4:08 PM Tiffany S wrote: Reason for CRM: hydrALAZINE  (APRESOLINE ) 25 MG tablet [[id number]]  Prescription was esigned please follow up with patient regarding status of prescription

## 2023-10-23 NOTE — Telephone Encounter (Signed)
 Called to let pt know medication was called in. Unable to lm on vm due to mailbox not being set up. Please advise if he calls back. LS

## 2023-10-26 NOTE — Telephone Encounter (Signed)
 Pt aware and picked it up yesterday.

## 2023-11-01 ENCOUNTER — Other Ambulatory Visit

## 2023-11-01 DIAGNOSIS — N1831 Chronic kidney disease, stage 3a: Secondary | ICD-10-CM

## 2023-11-01 DIAGNOSIS — E1165 Type 2 diabetes mellitus with hyperglycemia: Secondary | ICD-10-CM

## 2023-11-02 ENCOUNTER — Ambulatory Visit: Payer: Self-pay | Admitting: Family Medicine

## 2023-11-02 LAB — BASIC METABOLIC PANEL WITH GFR
BUN/Creatinine Ratio: 13 (ref 10–24)
BUN: 18 mg/dL (ref 8–27)
CO2: 22 mmol/L (ref 20–29)
Calcium: 9.3 mg/dL (ref 8.6–10.2)
Chloride: 104 mmol/L (ref 96–106)
Creatinine, Ser: 1.35 mg/dL — ABNORMAL HIGH (ref 0.76–1.27)
Glucose: 104 mg/dL — ABNORMAL HIGH (ref 70–99)
Potassium: 4.7 mmol/L (ref 3.5–5.2)
Sodium: 142 mmol/L (ref 134–144)
eGFR: 60 mL/min/1.73 (ref >59)

## 2023-11-02 NOTE — Progress Notes (Signed)
Hello Bradley Torres,  Your lab result is normal and/or stable.Some minor variations that are not significant are commonly marked abnormal, but do not represent any medical problem for you.  Best regards, Min Collymore, M.D.

## 2023-11-13 DIAGNOSIS — S31102A Unspecified open wound of abdominal wall, epigastric region without penetration into peritoneal cavity, initial encounter: Secondary | ICD-10-CM | POA: Diagnosis not present

## 2023-11-13 DIAGNOSIS — Z933 Colostomy status: Secondary | ICD-10-CM | POA: Diagnosis not present

## 2023-11-14 DIAGNOSIS — Z6841 Body Mass Index (BMI) 40.0 and over, adult: Secondary | ICD-10-CM | POA: Diagnosis not present

## 2023-11-14 DIAGNOSIS — E119 Type 2 diabetes mellitus without complications: Secondary | ICD-10-CM | POA: Diagnosis not present

## 2023-11-14 DIAGNOSIS — I10 Essential (primary) hypertension: Secondary | ICD-10-CM | POA: Diagnosis not present

## 2023-11-14 DIAGNOSIS — M542 Cervicalgia: Secondary | ICD-10-CM | POA: Diagnosis not present

## 2023-11-14 DIAGNOSIS — M539 Dorsopathy, unspecified: Secondary | ICD-10-CM | POA: Diagnosis not present

## 2023-11-14 DIAGNOSIS — N184 Chronic kidney disease, stage 4 (severe): Secondary | ICD-10-CM | POA: Diagnosis not present

## 2023-11-14 DIAGNOSIS — Z79899 Other long term (current) drug therapy: Secondary | ICD-10-CM | POA: Diagnosis not present

## 2023-11-16 DIAGNOSIS — Z79899 Other long term (current) drug therapy: Secondary | ICD-10-CM | POA: Diagnosis not present

## 2023-12-13 DIAGNOSIS — S31102A Unspecified open wound of abdominal wall, epigastric region without penetration into peritoneal cavity, initial encounter: Secondary | ICD-10-CM | POA: Diagnosis not present

## 2023-12-13 DIAGNOSIS — Z933 Colostomy status: Secondary | ICD-10-CM | POA: Diagnosis not present

## 2023-12-15 DIAGNOSIS — Z79899 Other long term (current) drug therapy: Secondary | ICD-10-CM | POA: Diagnosis not present

## 2023-12-15 DIAGNOSIS — Z6841 Body Mass Index (BMI) 40.0 and over, adult: Secondary | ICD-10-CM | POA: Diagnosis not present

## 2023-12-15 DIAGNOSIS — M539 Dorsopathy, unspecified: Secondary | ICD-10-CM | POA: Diagnosis not present

## 2023-12-15 DIAGNOSIS — I10 Essential (primary) hypertension: Secondary | ICD-10-CM | POA: Diagnosis not present

## 2023-12-15 DIAGNOSIS — E119 Type 2 diabetes mellitus without complications: Secondary | ICD-10-CM | POA: Diagnosis not present

## 2023-12-15 DIAGNOSIS — N184 Chronic kidney disease, stage 4 (severe): Secondary | ICD-10-CM | POA: Diagnosis not present

## 2023-12-22 DIAGNOSIS — Z79899 Other long term (current) drug therapy: Secondary | ICD-10-CM | POA: Diagnosis not present

## 2024-01-12 DIAGNOSIS — E119 Type 2 diabetes mellitus without complications: Secondary | ICD-10-CM | POA: Diagnosis not present

## 2024-01-12 DIAGNOSIS — Z79899 Other long term (current) drug therapy: Secondary | ICD-10-CM | POA: Diagnosis not present

## 2024-01-12 DIAGNOSIS — N2889 Other specified disorders of kidney and ureter: Secondary | ICD-10-CM | POA: Diagnosis not present

## 2024-01-12 DIAGNOSIS — M539 Dorsopathy, unspecified: Secondary | ICD-10-CM | POA: Diagnosis not present

## 2024-01-12 DIAGNOSIS — Z6841 Body Mass Index (BMI) 40.0 and over, adult: Secondary | ICD-10-CM | POA: Diagnosis not present

## 2024-01-12 DIAGNOSIS — I10 Essential (primary) hypertension: Secondary | ICD-10-CM | POA: Diagnosis not present

## 2024-01-26 ENCOUNTER — Other Ambulatory Visit: Payer: Self-pay | Admitting: Family Medicine

## 2024-01-26 DIAGNOSIS — E1165 Type 2 diabetes mellitus with hyperglycemia: Secondary | ICD-10-CM

## 2024-01-30 ENCOUNTER — Ambulatory Visit: Payer: Self-pay | Admitting: Family Medicine

## 2024-01-30 VITALS — BP 105/74 | HR 75 | Temp 98.0°F | Ht 72.0 in | Wt 375.0 lb

## 2024-01-30 DIAGNOSIS — E1169 Type 2 diabetes mellitus with other specified complication: Secondary | ICD-10-CM

## 2024-01-30 DIAGNOSIS — I152 Hypertension secondary to endocrine disorders: Secondary | ICD-10-CM | POA: Diagnosis not present

## 2024-01-30 DIAGNOSIS — E1165 Type 2 diabetes mellitus with hyperglycemia: Secondary | ICD-10-CM | POA: Diagnosis not present

## 2024-01-30 DIAGNOSIS — N1831 Chronic kidney disease, stage 3a: Secondary | ICD-10-CM

## 2024-01-30 DIAGNOSIS — E1159 Type 2 diabetes mellitus with other circulatory complications: Secondary | ICD-10-CM

## 2024-01-30 DIAGNOSIS — E785 Hyperlipidemia, unspecified: Secondary | ICD-10-CM

## 2024-01-30 DIAGNOSIS — M109 Gout, unspecified: Secondary | ICD-10-CM | POA: Diagnosis not present

## 2024-01-30 DIAGNOSIS — Z23 Encounter for immunization: Secondary | ICD-10-CM

## 2024-01-30 DIAGNOSIS — N183 Chronic kidney disease, stage 3 unspecified: Secondary | ICD-10-CM | POA: Diagnosis not present

## 2024-01-30 DIAGNOSIS — Z86711 Personal history of pulmonary embolism: Secondary | ICD-10-CM | POA: Diagnosis not present

## 2024-01-30 DIAGNOSIS — Z7984 Long term (current) use of oral hypoglycemic drugs: Secondary | ICD-10-CM | POA: Diagnosis not present

## 2024-01-30 DIAGNOSIS — R946 Abnormal results of thyroid function studies: Secondary | ICD-10-CM | POA: Diagnosis not present

## 2024-01-30 LAB — LIPID PANEL

## 2024-01-30 LAB — BAYER DCA HB A1C WAIVED: HB A1C (BAYER DCA - WAIVED): 6.4 % — ABNORMAL HIGH (ref 4.8–5.6)

## 2024-01-30 MED ORDER — RIVAROXABAN 20 MG PO TABS: 20.0000 mg | ORAL_TABLET | Freq: Every day | ORAL | 0 refills | Status: AC

## 2024-01-30 MED ORDER — DAPAGLIFLOZIN PROPANEDIOL 10 MG PO TABS: ORAL_TABLET | ORAL | 0 refills | Status: AC

## 2024-01-30 MED ORDER — ALLOPURINOL 100 MG PO TABS: 100.0000 mg | ORAL_TABLET | Freq: Every day | ORAL | 0 refills | Status: AC

## 2024-01-30 MED ORDER — TIRZEPATIDE 2.5 MG/0.5ML ~~LOC~~ SOAJ: 2.5000 mg | SUBCUTANEOUS | 1 refills | Status: DC

## 2024-01-30 MED ORDER — HYDRALAZINE HCL 25 MG PO TABS: 25.0000 mg | ORAL_TABLET | Freq: Three times a day (TID) | ORAL | 0 refills | Status: AC

## 2024-01-30 MED ORDER — METFORMIN HCL ER 750 MG PO TB24: 1500.0000 mg | ORAL_TABLET | Freq: Every day | ORAL | 1 refills | Status: AC

## 2024-01-30 MED ORDER — AMIODARONE HCL 200 MG PO TABS: 200.0000 mg | ORAL_TABLET | Freq: Every day | ORAL | 1 refills | Status: AC

## 2024-01-30 NOTE — Progress Notes (Signed)
 Subjective:  Patient ID: Bradley Torres, male    DOB: [birth year]  Age: 63 y.o. MRN: [id number]  CC: Diabetes   HPI  Discussed the use of AI scribe software for clinical note transcription with the patient, who gave verbal consent to proceed.  History of Present Illness Bradley Torres is a 63 year old male who presents with significant weight gain and knee pain.  He has experienced a significant weight gain of nearly fifty pounds over the past six months, attributed to an increased appetite. He is interested in exploring weight loss options, particularly a weight loss shot, as his brother has lost thirty to forty pounds using a similar treatment.  His knee pain has worsened since the weather changed, impacting his ability to stay active. The cold weather has further limited his physical activity, contributing to his weight gain.  His current medications include Januvia , which he is concerned about continuing, and Farxiga , which is not covered by his insurance. He is on Medicaid and has recently switched back to [facility] to ensure coverage for his ostomy supplies.  He dislikes getting shots but acknowledges the necessity of receiving a flu shot.  He has had issues with his pharmacy not having his prescriptions ready, which he attributes to communication issues between the pharmacy and his healthcare provider.          02/21/2023    3:43 PM 08/02/2022    3:11 PM 07/19/2022    2:46 PM  Depression screen PHQ 2/9  Decreased Interest 0 0 0  Down, Depressed, Hopeless 0 0 0  PHQ - 2 Score 0 0 0    History Bradley Torres has a past medical history of CAD (coronary artery disease), Chronic back pain, Diabetes mellitus, type 2 (HCC), DVT (deep venous thrombosis) (HCC), Essential hypertension, Family hx of colon cancer (05/03/2017), MI (myocardial infarction) (HCC) (2019), Morbid obesity (HCC), Panic disorder, Shoulder pain, left, Sleep apnea, Stroke (HCC) (01/2018), and Vitamin D   deficiency.   He has a past surgical history that includes Lumbar spine surgery; Ankle fracture sugery (Left); Tonsillectomy; Colonoscopy with propofol  (N/A, 05/29/2017); polypectomy (05/29/2017); LEFT HEART CATH AND CORONARY ANGIOGRAPHY (N/A, 01/23/2018); Colonoscopy with propofol  (N/A, 07/20/2020); biopsy (07/20/2020); Interventional radiology procedure (10/08/2022); IR Radiologist Eval & Mgmt (10/26/2022); IR Radiologist Eval & Mgmt (01/04/2023); IR Catheter Tube Change (01/16/2023); IR Radiologist Eval & Mgmt (01/30/2023); IR Radiologist Eval & Mgmt (02/17/2023); IR Catheter Tube Change (05/23/2023); laparotomy (N/A, 05/31/2023); Colon resection sigmoid (N/A, 05/31/2023); Colostomy (N/A, 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   His family history includes Colon cancer in his mother; Heart attack in his father and paternal grandfather; Hepatitis (age of onset: 96) in his sister.He reports that he has never smoked. He has never been exposed to tobacco smoke. He has never used smokeless tobacco. He reports current alcohol use. He reports that he does not currently use drugs after having used the following drugs: Marijuana.    ROS Review of Systems  Constitutional: Negative.   HENT: Negative.    Eyes:  Negative for visual disturbance.  Respiratory:  Negative for cough and shortness of breath.   Cardiovascular:  Negative for chest pain and leg swelling.  Gastrointestinal:  Negative for abdominal pain, diarrhea, nausea and vomiting.  Genitourinary:  Negative for difficulty urinating.  Musculoskeletal:  Negative for arthralgias and myalgias.  Skin:  Negative for rash.  Neurological:  Negative  for headaches.  Psychiatric/Behavioral:  Negative for sleep disturbance.     Objective:  BP 105/74   Pulse 75   Temp 98 F (36.7 C)   Ht 6' (1.829 m)   Wt (!) 375 lb  (170.1 kg)   SpO2 97%   BMI 50.86 kg/m   BP Readings from Last 3 Encounters:  01/30/24 105/74  10/18/23 (!) 151/80  09/11/23 (!) 157/88    Wt Readings from Last 3 Encounters:  01/30/24 (!) 375 lb (170.1 kg)  10/18/23 (!) 356 lb (161.5 kg)  07/31/23 (!) 327 lb 6.4 oz (148.5 kg)     Physical Exam Vitals reviewed.  Constitutional:      Appearance: He is well-developed. He is obese.  HENT:     Head: Normocephalic and atraumatic.     Right Ear: External ear normal.     Left Ear: External ear normal.     Mouth/Throat:     Pharynx: No oropharyngeal exudate or posterior oropharyngeal erythema.  Eyes:     Pupils: Pupils are equal, round, and reactive to light.  Cardiovascular:     Rate and Rhythm: Normal rate and regular rhythm.     Heart sounds: No murmur heard. Pulmonary:     Effort: No respiratory distress.     Breath sounds: Normal breath sounds.  Musculoskeletal:     Cervical back: Normal range of motion and neck supple.  Neurological:     Mental Status: He is alert and oriented to person, place, and time.    Physical Exam GENERAL: Alert, cooperative, well developed, no acute distress. HEENT: Normocephalic, normal oropharynx, moist mucous membranes. CHEST: Clear to auscultation bilaterally, no wheezes, rhonchi, or crackles. CARDIOVASCULAR: Normal heart rate and rhythm, S1 and S2 normal without murmurs. ABDOMEN: Soft, non-tender, non-distended, without organomegaly, normal bowel sounds. EXTREMITIES: No cyanosis or edema. NEUROLOGICAL: Cranial nerves grossly intact, moves all extremities without gross motor or sensory deficit.   Assessment & Plan:  Stage 3a chronic kidney disease (HCC) -     Comprehensive metabolic panel with GFR -     Dapagliflozin  Propanediol; TAKE ONE TABLET BY MOUTH DAILY BEFORE BREAKFAST  Dispense: 90 tablet; Refill: 0  Type 2 diabetes mellitus with hyperglycemia, without long-term current use of insulin  (HCC) -     Bayer DCA Hb A1c Waived -      Microalbumin / creatinine urine ratio -     Allopurinol ; Take 1 tablet (100 mg total) by mouth daily.  Dispense: 90 tablet; Refill: 0 -     Rivaroxaban ; Take 1 tablet (20 mg total) by mouth daily with supper.  Dispense: 90 tablet; Refill: 0 -     TSH + free T4 -     Dapagliflozin  Propanediol; TAKE ONE TABLET BY MOUTH DAILY BEFORE BREAKFAST  Dispense: 90 tablet; Refill: 0  Hypertension associated with diabetes (HCC) -     Comprehensive metabolic panel with GFR -     Allopurinol ; Take 1 tablet (100 mg total) by mouth daily.  Dispense: 90 tablet; Refill: 0 -     Rivaroxaban ; Take 1 tablet (20 mg total) by mouth daily with supper.  Dispense: 90 tablet; Refill: 0  Morbid (severe) obesity due to excess calories (HCC) -     Comprehensive metabolic panel with GFR -     Lipid panel -     TSH + free T4  Stage 3 chronic kidney disease, unspecified whether stage 3a or 3b CKD (HCC) -     Bayer DCA Hb A1c Waived -  Comprehensive metabolic panel with GFR -     Microalbumin / creatinine urine ratio  Hyperlipidemia associated with type 2 diabetes mellitus (HCC) -     Lipid panel  Gout without tophus -     Allopurinol ; Take 1 tablet (100 mg total) by mouth daily.  Dispense: 90 tablet; Refill: 0  Hx pulmonary embolism -     Rivaroxaban ; Take 1 tablet (20 mg total) by mouth daily with supper.  Dispense: 90 tablet; Refill: 0  Encounter for immunization -     Flu vaccine trivalent PF, 6mos and older(Flulaval,Afluria,Fluarix,Fluzone)  Immunization due -     Varicella-zoster vaccine IM  Other orders -     Amiodarone  HCl; Take 1 tablet (200 mg total) by mouth daily.  Dispense: 90 tablet; Refill: 1 -     hydrALAZINE  HCl; Take 1 tablet (25 mg total) by mouth 3 (three) times daily.  Dispense: 270 tablet; Refill: 0 -     metFORMIN  HCl ER; Take 2 tablets (1,500 mg total) by mouth daily with breakfast.  Dispense: 180 tablet; Refill: 1 -     Tirzepatide; Inject 2.5 mg into the skin once a week.   Dispense: 2 mL; Refill: 1    Assessment and Plan Assessment & Plan Morbid obesity due to excess calories   He has experienced a weight gain of approximately 50 pounds over the last six months and reports difficulty controlling eating habits. Considering Mounjaro for weight management, as it has been effective in similar cases, such as his brother's 30-40 pound weight loss. Initiate Mounjaro at 2.5 mg once weekly for four weeks. Schedule a follow-up in one month to assess weight loss and tolerance. If well-tolerated, increase Mounjaro to 5 mg with subsequent follow-ups. Educate on the importance of healthy eating and regular exercise to enhance medication efficacy.  Type 2 diabetes mellitus with hyperglycemia and circulatory complications   His current A1c is 6.4%. He is on Farxiga , which may not be covered by insurance. Discussed switching to Jardiance if coverage issues persist. Added thyroid  function tests to evaluate potential underlying causes of weight gain. Monitor insurance coverage for Farxiga  and switch to Jardiance if necessary. Add thyroid  function tests (TSH and free T4).  General Health Maintenance   Discussed the importance of vaccinations, specifically the shingles vaccine, which he initially declined but later agreed to receive. Explained the nature of shingles and the benefits of vaccination, including the prevention of severe pain associated with reactivation of the chickenpox virus. Administer shingles and flu vaccines.       Follow-up: Return in about 1 month (around 02/29/2024).  Hixon Der, M.D.

## 2024-01-31 ENCOUNTER — Telehealth: Payer: Self-pay | Admitting: Pharmacy Technician

## 2024-01-31 ENCOUNTER — Other Ambulatory Visit: Payer: Self-pay | Admitting: Family Medicine

## 2024-01-31 LAB — COMPREHENSIVE METABOLIC PANEL WITH GFR
ALT: 13 IU/L (ref 0–44)
AST: 15 IU/L (ref 0–40)
Albumin: 4 g/dL (ref 3.9–4.9)
Alkaline Phosphatase: 113 IU/L (ref 47–123)
BUN/Creatinine Ratio: 13 (ref 10–24)
BUN: 19 mg/dL (ref 8–27)
Bilirubin Total: 0.5 mg/dL (ref 0.0–1.2)
CO2: 24 mmol/L (ref 20–29)
Calcium: 9.4 mg/dL (ref 8.6–10.2)
Chloride: 100 mmol/L (ref 96–106)
Creatinine, Ser: 1.43 mg/dL — AB (ref 0.76–1.27)
Globulin, Total: 3 g/dL (ref 1.5–4.5)
Glucose: 151 mg/dL — AB (ref 70–99)
Potassium: 3.8 mmol/L (ref 3.5–5.2)
Sodium: 138 mmol/L (ref 134–144)
Total Protein: 7 g/dL (ref 6.0–8.5)
eGFR: 55 mL/min/1.73 — AB (ref >59)

## 2024-01-31 LAB — LIPID PANEL
Cholesterol, Total: 149 mg/dL (ref 100–199)
HDL: 49 mg/dL (ref >39)
LDL CALC COMMENT:: 3 ratio (ref 0.0–5.0)
LDL Chol Calc (NIH): 81 mg/dL (ref 0–99)
Triglycerides: 103 mg/dL (ref 0–149)
VLDL Cholesterol Cal: 19 mg/dL (ref 5–40)

## 2024-01-31 LAB — MICROALBUMIN / CREATININE URINE RATIO
Creatinine, Urine: 89.3 mg/dL
Microalb/Creat Ratio: 16 mg/g{creat} (ref 0–29)
Microalbumin, Urine: 14.3 ug/mL

## 2024-01-31 MED ORDER — OZEMPIC (0.25 OR 0.5 MG/DOSE) 2 MG/3ML ~~LOC~~ SOPN: 0.5000 mg | PEN_INJECTOR | SUBCUTANEOUS | 1 refills | Status: AC

## 2024-01-31 NOTE — Telephone Encounter (Addendum)
 Per Medicaid's requirements, the prior authorization will not be approved if submitted.

## 2024-01-31 NOTE — Telephone Encounter (Signed)
 Pharmacy Patient Advocate Encounter   Received notification from CoverMyMeds that prior authorization for Mounjaro 2.5MG /0.5ML auto-injectors  is required/requested.   Insurance verification completed.   The patient is insured through [employer]. Key: [id number]  Prior Authorization form/request asks a question that requires your assistance. Please see the question below and advise accordingly. The PA will not be submitted until the necessary information is received.     **Medicaid requires patients to have tried at least 2 preferred medications. I could only find Trulicity . Please advise.**

## 2024-01-31 NOTE — Telephone Encounter (Signed)
 Pt notified via voicemail. LS

## 2024-01-31 NOTE — Telephone Encounter (Signed)
 Please let the patient know that I sent their prescription to their pharmacy. Thanks, WS

## 2024-02-01 ENCOUNTER — Telehealth: Payer: Self-pay | Admitting: Pharmacy Technician

## 2024-02-01 ENCOUNTER — Other Ambulatory Visit (HOSPITAL_COMMUNITY): Payer: Self-pay

## 2024-02-01 LAB — SPECIMEN STATUS REPORT

## 2024-02-01 LAB — TSH+FREE T4
Free T4: 1.28 ng/dL (ref 0.82–1.77)
TSH: 4.72 u[IU]/mL — AB (ref 0.450–4.500)

## 2024-02-01 NOTE — Telephone Encounter (Signed)
 Pharmacy Patient Advocate Encounter   Received notification from Pt Calls Messages that prior authorization for Ozempic (0.25 or 0.5 MG/DOSE) 2MG /3ML pen-injectors  is required/requested.   Insurance verification completed.   The patient is insured through [employer].   Per test claim: PA required and submitted KEY/EOC/Request #: [id number] due to No PA needed. The pharmacy just needs to use an override code.

## 2024-02-06 ENCOUNTER — Other Ambulatory Visit (HOSPITAL_COMMUNITY): Payer: Self-pay

## 2024-02-07 ENCOUNTER — Other Ambulatory Visit (HOSPITAL_COMMUNITY): Payer: Self-pay

## 2024-02-12 DIAGNOSIS — I10 Essential (primary) hypertension: Secondary | ICD-10-CM | POA: Diagnosis not present

## 2024-02-12 DIAGNOSIS — N2889 Other specified disorders of kidney and ureter: Secondary | ICD-10-CM | POA: Diagnosis not present

## 2024-02-12 DIAGNOSIS — M539 Dorsopathy, unspecified: Secondary | ICD-10-CM | POA: Diagnosis not present

## 2024-02-12 DIAGNOSIS — Z79899 Other long term (current) drug therapy: Secondary | ICD-10-CM | POA: Diagnosis not present

## 2024-02-12 DIAGNOSIS — Z6841 Body Mass Index (BMI) 40.0 and over, adult: Secondary | ICD-10-CM | POA: Diagnosis not present

## 2024-02-12 DIAGNOSIS — E119 Type 2 diabetes mellitus without complications: Secondary | ICD-10-CM | POA: Diagnosis not present

## 2024-02-14 DIAGNOSIS — S31102A Unspecified open wound of abdominal wall, epigastric region without penetration into peritoneal cavity, initial encounter: Secondary | ICD-10-CM | POA: Diagnosis not present

## 2024-02-15 DIAGNOSIS — Z79899 Other long term (current) drug therapy: Secondary | ICD-10-CM | POA: Diagnosis not present

## 2024-02-29 ENCOUNTER — Encounter: Payer: Self-pay | Admitting: Family Medicine

## 2024-02-29 ENCOUNTER — Ambulatory Visit: Admitting: Family Medicine

## 2024-02-29 VITALS — BP 180/74 | HR 52 | Temp 97.7°F | Ht 72.0 in | Wt 382.0 lb

## 2024-02-29 DIAGNOSIS — Z7984 Long term (current) use of oral hypoglycemic drugs: Secondary | ICD-10-CM

## 2024-02-29 DIAGNOSIS — I152 Hypertension secondary to endocrine disorders: Secondary | ICD-10-CM | POA: Diagnosis not present

## 2024-02-29 DIAGNOSIS — E1159 Type 2 diabetes mellitus with other circulatory complications: Secondary | ICD-10-CM

## 2024-02-29 DIAGNOSIS — G4733 Obstructive sleep apnea (adult) (pediatric): Secondary | ICD-10-CM

## 2024-02-29 NOTE — Progress Notes (Unsigned)
 Subjective:  Patient ID: Bradley Torres, male    DOB: 1960/10/19  Age: 63 y.o. MRN: [id number]  CC: Medical Management of Chronic Issues   HPI  Discussed the use of AI scribe software for clinical note transcription with the patient, who gave verbal consent to proceed.  History of Present Illness Bradley Torres is a 63 year old male who presents for instruction on using Ozempic  for weight management.  He has not started Ozempic  due to uncertainty about its administration and his inability to read, which delayed the initiation of treatment. His current weight is 382 pounds, and he has not experienced any weight loss as he has not yet begun the medication.  He received his Ozempic  prescription from [facility] but did not receive instructions on its use from the pharmacy.          02/29/2024    3:15 PM 02/21/2023    3:43 PM 08/02/2022    3:11 PM  Depression screen PHQ 2/9  Decreased Interest 0 0 0  Down, Depressed, Hopeless 0 0 0  PHQ - 2 Score 0 0 0  Altered sleeping 0    Tired, decreased energy 0    Change in appetite 0    Feeling bad or failure about yourself  0    Trouble concentrating 0    Moving slowly or fidgety/restless 0    Suicidal thoughts 0    PHQ-9 Score 0    Difficult doing work/chores Not difficult at all      History Bradley Torres has a past medical history of CAD (coronary artery disease), Chronic back pain, Diabetes mellitus, type 2 (HCC), DVT (deep venous thrombosis) (HCC), Essential hypertension, Family hx of colon cancer (05/03/2017), MI (myocardial infarction) (HCC) (2019), Morbid obesity (HCC), Panic disorder, Shoulder pain, left, Sleep apnea, Stroke (HCC) (01/2018), and Vitamin D  deficiency.   He has a past surgical history that includes Lumbar spine surgery; Ankle fracture sugery (Left); Tonsillectomy; Colonoscopy with propofol  (N/A, 05/29/2017); polypectomy (05/29/2017); LEFT HEART CATH AND CORONARY ANGIOGRAPHY (N/A, 01/23/2018); Colonoscopy with propofol   (N/A, 07/20/2020); biopsy (07/20/2020); Interventional radiology procedure (10/08/2022); IR Radiologist Eval & Mgmt (10/26/2022); IR Radiologist Eval & Mgmt (01/04/2023); IR Catheter Tube Change (01/16/2023); IR Radiologist Eval & Mgmt (01/30/2023); IR Radiologist Eval & Mgmt (02/17/2023); IR Catheter Tube Change (05/23/2023); laparotomy (N/A, 05/31/2023); Colon resection sigmoid (N/A, 05/31/2023); Colostomy (N/A, 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   His family history includes Colon cancer in his mother; Heart attack in his father and paternal grandfather; Hepatitis (age of onset: 68) in his sister.He reports that he has never smoked. He has never been exposed to tobacco smoke. He has never used smokeless tobacco. He reports current alcohol use. He reports that he does not currently use drugs after having used the following drugs: Marijuana.    ROS Review of Systems  Constitutional: Negative.   HENT: Negative.    Eyes:  Negative for visual disturbance.  Respiratory:  Negative for cough and shortness of breath.   Cardiovascular:  Negative for chest pain and leg swelling.  Gastrointestinal:  Negative for abdominal pain, diarrhea, nausea and vomiting.  Genitourinary:  Negative for difficulty urinating.  Musculoskeletal:  Negative for arthralgias and myalgias.  Skin:  Negative for rash.  Neurological:  Negative for headaches.  Psychiatric/Behavioral:  Negative for sleep disturbance.     Objective:  BP (!) 180/74   Pulse (!) 52  Temp 97.7 F (36.5 C)   Ht 6' (1.829 m)   Wt (!) 382 lb (173.3 kg)   SpO2 96%   BMI 51.81 kg/m   BP Readings from Last 3 Encounters:  02/29/24 (!) 180/74  01/30/24 105/74  10/18/23 (!) 151/80    Wt Readings from Last 3 Encounters:  02/29/24 (!) 382 lb (173.3 kg)  01/30/24 (!) 375 lb (170.1 kg)  10/18/23 (!) 356 lb  (161.5 kg)     Physical Exam Vitals reviewed.  Constitutional:      Appearance: He is well-developed.  HENT:     Head: Normocephalic and atraumatic.     Right Ear: External ear normal.     Left Ear: External ear normal.     Mouth/Throat:     Pharynx: No oropharyngeal exudate or posterior oropharyngeal erythema.  Eyes:     Pupils: Pupils are equal, round, and reactive to light.  Cardiovascular:     Rate and Rhythm: Normal rate and regular rhythm.     Heart sounds: No murmur heard. Pulmonary:     Effort: No respiratory distress.     Breath sounds: Normal breath sounds.  Musculoskeletal:     Cervical back: Normal range of motion and neck supple.  Neurological:     Mental Status: He is alert and oriented to person, place, and time.    Physical Exam MEASUREMENTS: Weight- 382. GENERAL: Alert, cooperative, well developed, no acute distress HEENT: Normocephalic, normal oropharynx, moist mucous membranes CHEST: Clear to auscultation bilaterally, No wheezes, rhonchi, or crackles CARDIOVASCULAR: Normal heart rate and rhythm, S1 and S2 normal without murmurs ABDOMEN: Soft, non-tender, non-distended, without organomegaly, Normal bowel sounds EXTREMITIES: No cyanosis or edema NEUROLOGICAL: Cranial nerves grossly intact, Moves all extremities without gross motor or sensory deficit   Assessment & Plan:  Hypertension associated with diabetes (HCC)  Severe obstructive sleep apnea-hypopnea syndrome  Morbid (severe) obesity due to excess calories (HCC)    Assessment and Plan Assessment & Plan Morbid obesity due to excess calories   Morbid obesity persists with a weight of 382 lbs. He has not started Ozempic  due to difficulty understanding its administration and a delay in obtaining it. Insurance issues necessitated switching from Mounjaro  to Ozempic . Administered the first dose of Ozempic  in the clinic and instructed him on self-administration. Continue Ozempic  0.25 mg weekly for four  weeks, then increase to 0.5 mg weekly. Store Ozempic  in the refrigerator. Schedule a follow-up appointment between January 18th and 22nd, 2026.  Type 2 diabetes mellitus with hyperglycemia   Type 2 diabetes mellitus with hyperglycemia is managed with Januvia  and Farxiga . Continue Januvia  and Farxiga .        Follow-up: Return in about 7 weeks (around 04/15/2024).  Tracz Der, M.D.

## 2024-03-03 ENCOUNTER — Encounter: Payer: Self-pay | Admitting: Family Medicine

## 2024-03-11 DIAGNOSIS — M539 Dorsopathy, unspecified: Secondary | ICD-10-CM | POA: Diagnosis not present

## 2024-03-11 DIAGNOSIS — R030 Elevated blood-pressure reading, without diagnosis of hypertension: Secondary | ICD-10-CM | POA: Diagnosis not present

## 2024-03-11 DIAGNOSIS — Z79899 Other long term (current) drug therapy: Secondary | ICD-10-CM | POA: Diagnosis not present

## 2024-03-11 DIAGNOSIS — Z125 Encounter for screening for malignant neoplasm of prostate: Secondary | ICD-10-CM | POA: Diagnosis not present

## 2024-03-11 DIAGNOSIS — Z6841 Body Mass Index (BMI) 40.0 and over, adult: Secondary | ICD-10-CM | POA: Diagnosis not present

## 2024-03-11 DIAGNOSIS — E119 Type 2 diabetes mellitus without complications: Secondary | ICD-10-CM | POA: Diagnosis not present

## 2024-03-11 DIAGNOSIS — I10 Essential (primary) hypertension: Secondary | ICD-10-CM | POA: Diagnosis not present

## 2024-03-18 DIAGNOSIS — S31102A Unspecified open wound of abdominal wall, epigastric region without penetration into peritoneal cavity, initial encounter: Secondary | ICD-10-CM | POA: Diagnosis not present

## 2024-03-18 DIAGNOSIS — Z79899 Other long term (current) drug therapy: Secondary | ICD-10-CM | POA: Diagnosis not present

## 2024-03-18 DIAGNOSIS — Z933 Colostomy status: Secondary | ICD-10-CM | POA: Diagnosis not present

## 2024-04-22 ENCOUNTER — Ambulatory Visit: Admitting: Family Medicine

## 2024-04-29 ENCOUNTER — Other Ambulatory Visit (HOSPITAL_COMMUNITY): Payer: Self-pay

## 2024-05-03 ENCOUNTER — Other Ambulatory Visit: Payer: Self-pay | Admitting: Family Medicine
